# Patient Record
Sex: Female | Born: 1950 | Race: Black or African American | Hispanic: No | Marital: Married | Smoking: Former smoker
Health system: Southern US, Community
[De-identification: ages and names within clinical notes are randomized; demographics above are authoritative.]

## PROBLEM LIST (undated history)

## (undated) DIAGNOSIS — M542 Cervicalgia: Secondary | ICD-10-CM

## (undated) DIAGNOSIS — G629 Polyneuropathy, unspecified: Secondary | ICD-10-CM

## (undated) DIAGNOSIS — E1143 Type 2 diabetes mellitus with diabetic autonomic (poly)neuropathy: Secondary | ICD-10-CM

## (undated) DIAGNOSIS — G4730 Sleep apnea, unspecified: Secondary | ICD-10-CM

## (undated) DIAGNOSIS — N184 Chronic kidney disease, stage 4 (severe): Secondary | ICD-10-CM

## (undated) DIAGNOSIS — R51 Headache: Secondary | ICD-10-CM

## (undated) DIAGNOSIS — K56609 Unspecified intestinal obstruction, unspecified as to partial versus complete obstruction: Secondary | ICD-10-CM

## (undated) DIAGNOSIS — I5030 Unspecified diastolic (congestive) heart failure: Secondary | ICD-10-CM

## (undated) DIAGNOSIS — Z8719 Personal history of other diseases of the digestive system: Secondary | ICD-10-CM

## (undated) DIAGNOSIS — M797 Fibromyalgia: Secondary | ICD-10-CM

## (undated) DIAGNOSIS — Z0000 Encounter for general adult medical examination without abnormal findings: Secondary | ICD-10-CM

## (undated) DIAGNOSIS — K3184 Gastroparesis: Secondary | ICD-10-CM

## (undated) DIAGNOSIS — F603 Borderline personality disorder: Secondary | ICD-10-CM

## (undated) DIAGNOSIS — G5601 Carpal tunnel syndrome, right upper limb: Secondary | ICD-10-CM

## (undated) DIAGNOSIS — T1491XA Suicide attempt, initial encounter: Secondary | ICD-10-CM

## (undated) DIAGNOSIS — Z972 Presence of dental prosthetic device (complete) (partial): Secondary | ICD-10-CM

## (undated) DIAGNOSIS — E785 Hyperlipidemia, unspecified: Secondary | ICD-10-CM

## (undated) DIAGNOSIS — E538 Deficiency of other specified B group vitamins: Secondary | ICD-10-CM

## (undated) DIAGNOSIS — M7630 Iliotibial band syndrome, unspecified leg: Secondary | ICD-10-CM

## (undated) DIAGNOSIS — F29 Unspecified psychosis not due to a substance or known physiological condition: Secondary | ICD-10-CM

## (undated) DIAGNOSIS — Z973 Presence of spectacles and contact lenses: Secondary | ICD-10-CM

## (undated) DIAGNOSIS — M545 Low back pain, unspecified: Secondary | ICD-10-CM

## (undated) DIAGNOSIS — I10 Essential (primary) hypertension: Secondary | ICD-10-CM

## (undated) DIAGNOSIS — K589 Irritable bowel syndrome without diarrhea: Secondary | ICD-10-CM

## (undated) DIAGNOSIS — R0600 Dyspnea, unspecified: Secondary | ICD-10-CM

## (undated) DIAGNOSIS — F32A Depression, unspecified: Secondary | ICD-10-CM

## (undated) DIAGNOSIS — D649 Anemia, unspecified: Secondary | ICD-10-CM

## (undated) DIAGNOSIS — I2510 Atherosclerotic heart disease of native coronary artery without angina pectoris: Secondary | ICD-10-CM

## (undated) DIAGNOSIS — K219 Gastro-esophageal reflux disease without esophagitis: Secondary | ICD-10-CM

## (undated) DIAGNOSIS — M1990 Unspecified osteoarthritis, unspecified site: Secondary | ICD-10-CM

## (undated) DIAGNOSIS — F329 Major depressive disorder, single episode, unspecified: Secondary | ICD-10-CM

## (undated) DIAGNOSIS — M109 Gout, unspecified: Secondary | ICD-10-CM

## (undated) DIAGNOSIS — E119 Type 2 diabetes mellitus without complications: Secondary | ICD-10-CM

## (undated) DIAGNOSIS — K029 Dental caries, unspecified: Secondary | ICD-10-CM

## (undated) HISTORY — DX: Type 2 diabetes mellitus with diabetic autonomic (poly)neuropathy: E11.43

## (undated) HISTORY — DX: Low back pain: M54.5

## (undated) HISTORY — DX: Gastro-esophageal reflux disease without esophagitis: K21.9

## (undated) HISTORY — DX: Deficiency of other specified B group vitamins: E53.8

## (undated) HISTORY — PX: APPENDECTOMY: SHX54

## (undated) HISTORY — DX: Essential (primary) hypertension: I10

## (undated) HISTORY — DX: Cervicalgia: M54.2

## (undated) HISTORY — DX: Borderline personality disorder: F60.3

## (undated) HISTORY — DX: Major depressive disorder, single episode, unspecified: F32.9

## (undated) HISTORY — DX: Low back pain, unspecified: M54.50

## (undated) HISTORY — PX: COLONOSCOPY: SHX174

## (undated) HISTORY — DX: Anemia, unspecified: D64.9

## (undated) HISTORY — DX: Polyneuropathy, unspecified: G62.9

## (undated) HISTORY — DX: Depression, unspecified: F32.A

## (undated) HISTORY — DX: Unspecified diastolic (congestive) heart failure: I50.30

## (undated) HISTORY — PX: CATARACT EXTRACTION W/ INTRAOCULAR LENS  IMPLANT, BILATERAL: SHX1307

## (undated) HISTORY — DX: Encounter for general adult medical examination without abnormal findings: Z00.00

## (undated) HISTORY — DX: Hyperlipidemia, unspecified: E78.5

## (undated) HISTORY — PX: MULTIPLE TOOTH EXTRACTIONS: SHX2053

## (undated) HISTORY — DX: Unspecified intestinal obstruction, unspecified as to partial versus complete obstruction: K56.609

## (undated) HISTORY — DX: Atherosclerotic heart disease of native coronary artery without angina pectoris: I25.10

## (undated) HISTORY — DX: Gastroparesis: K31.84

## (undated) HISTORY — PX: ABDOMINAL HYSTERECTOMY: SHX81

## (undated) HISTORY — DX: Iliotibial band syndrome, unspecified leg: M76.30

## (undated) HISTORY — DX: Sleep apnea, unspecified: G47.30

## (undated) HISTORY — DX: Unspecified psychosis not due to a substance or known physiological condition: F29

## (undated) HISTORY — DX: Type 2 diabetes mellitus without complications: E11.9

---

## 1990-10-25 HISTORY — PX: HAND SURGERY: SHX662

## 1998-02-26 ENCOUNTER — Inpatient Hospital Stay (HOSPITAL_COMMUNITY): Admission: AD | Admit: 1998-02-26 | Discharge: 1998-03-08 | Payer: Self-pay | Admitting: Psychiatry

## 1998-04-10 ENCOUNTER — Emergency Department (HOSPITAL_COMMUNITY): Admission: EM | Admit: 1998-04-10 | Discharge: 1998-04-10 | Payer: Self-pay | Admitting: *Deleted

## 1998-05-01 ENCOUNTER — Emergency Department (HOSPITAL_COMMUNITY): Admission: EM | Admit: 1998-05-01 | Discharge: 1998-05-01 | Payer: Self-pay | Admitting: Emergency Medicine

## 1999-03-04 ENCOUNTER — Emergency Department (HOSPITAL_COMMUNITY): Admission: EM | Admit: 1999-03-04 | Discharge: 1999-03-04 | Payer: Self-pay | Admitting: Emergency Medicine

## 1999-03-10 ENCOUNTER — Encounter: Admission: RE | Admit: 1999-03-10 | Discharge: 1999-03-10 | Payer: Self-pay | Admitting: Internal Medicine

## 1999-03-13 ENCOUNTER — Encounter: Admission: RE | Admit: 1999-03-13 | Discharge: 1999-03-13 | Payer: Self-pay | Admitting: Internal Medicine

## 1999-03-17 ENCOUNTER — Encounter: Admission: RE | Admit: 1999-03-17 | Discharge: 1999-03-17 | Payer: Self-pay | Admitting: Internal Medicine

## 1999-03-26 ENCOUNTER — Encounter: Admission: RE | Admit: 1999-03-26 | Discharge: 1999-03-26 | Payer: Self-pay | Admitting: Hematology and Oncology

## 1999-04-07 ENCOUNTER — Encounter: Admission: RE | Admit: 1999-04-07 | Discharge: 1999-04-07 | Payer: Self-pay | Admitting: Hematology and Oncology

## 1999-04-20 ENCOUNTER — Encounter: Admission: RE | Admit: 1999-04-20 | Discharge: 1999-07-19 | Payer: Self-pay

## 1999-05-19 ENCOUNTER — Encounter: Admission: RE | Admit: 1999-05-19 | Discharge: 1999-05-19 | Payer: Self-pay | Admitting: Internal Medicine

## 1999-06-22 ENCOUNTER — Encounter: Admission: RE | Admit: 1999-06-22 | Discharge: 1999-06-22 | Payer: Self-pay | Admitting: Internal Medicine

## 1999-08-24 ENCOUNTER — Encounter: Admission: RE | Admit: 1999-08-24 | Discharge: 1999-08-24 | Payer: Self-pay | Admitting: Internal Medicine

## 1999-09-28 ENCOUNTER — Encounter: Admission: RE | Admit: 1999-09-28 | Discharge: 1999-09-28 | Payer: Self-pay | Admitting: Internal Medicine

## 1999-11-04 ENCOUNTER — Encounter: Admission: RE | Admit: 1999-11-04 | Discharge: 1999-11-04 | Payer: Self-pay | Admitting: Internal Medicine

## 1999-12-07 ENCOUNTER — Encounter: Admission: RE | Admit: 1999-12-07 | Discharge: 1999-12-07 | Payer: Self-pay | Admitting: Internal Medicine

## 1999-12-11 ENCOUNTER — Emergency Department (HOSPITAL_COMMUNITY): Admission: EM | Admit: 1999-12-11 | Discharge: 1999-12-11 | Payer: Self-pay

## 1999-12-21 ENCOUNTER — Encounter: Admission: RE | Admit: 1999-12-21 | Discharge: 1999-12-21 | Payer: Self-pay | Admitting: Internal Medicine

## 2000-01-04 ENCOUNTER — Ambulatory Visit (HOSPITAL_COMMUNITY): Admission: RE | Admit: 2000-01-04 | Discharge: 2000-01-04 | Payer: Self-pay

## 2000-01-22 ENCOUNTER — Encounter: Admission: RE | Admit: 2000-01-22 | Discharge: 2000-01-22 | Payer: Self-pay | Admitting: Internal Medicine

## 2000-01-27 ENCOUNTER — Encounter: Admission: RE | Admit: 2000-01-27 | Discharge: 2000-01-27 | Payer: Self-pay | Admitting: Internal Medicine

## 2000-03-14 ENCOUNTER — Encounter: Admission: RE | Admit: 2000-03-14 | Discharge: 2000-03-14 | Payer: Self-pay | Admitting: Internal Medicine

## 2000-03-28 ENCOUNTER — Encounter: Admission: RE | Admit: 2000-03-28 | Discharge: 2000-03-28 | Payer: Self-pay | Admitting: Internal Medicine

## 2000-04-07 ENCOUNTER — Encounter: Admission: RE | Admit: 2000-04-07 | Discharge: 2000-04-07 | Payer: Self-pay | Admitting: Internal Medicine

## 2000-05-02 ENCOUNTER — Encounter: Admission: RE | Admit: 2000-05-02 | Discharge: 2000-05-02 | Payer: Self-pay | Admitting: Internal Medicine

## 2000-07-18 ENCOUNTER — Encounter: Admission: RE | Admit: 2000-07-18 | Discharge: 2000-07-18 | Payer: Self-pay | Admitting: Internal Medicine

## 2000-12-01 ENCOUNTER — Encounter: Admission: RE | Admit: 2000-12-01 | Discharge: 2000-12-01 | Payer: Self-pay | Admitting: Hematology and Oncology

## 2001-02-03 ENCOUNTER — Encounter: Payer: Self-pay | Admitting: Emergency Medicine

## 2001-02-03 ENCOUNTER — Encounter: Payer: Self-pay | Admitting: *Deleted

## 2001-02-03 ENCOUNTER — Inpatient Hospital Stay (HOSPITAL_COMMUNITY): Admission: EM | Admit: 2001-02-03 | Discharge: 2001-02-06 | Payer: Self-pay | Admitting: Emergency Medicine

## 2001-02-09 ENCOUNTER — Encounter: Admission: RE | Admit: 2001-02-09 | Discharge: 2001-02-09 | Payer: Self-pay

## 2001-03-01 ENCOUNTER — Encounter: Admission: RE | Admit: 2001-03-01 | Discharge: 2001-03-01 | Payer: Self-pay

## 2001-03-15 ENCOUNTER — Emergency Department (HOSPITAL_COMMUNITY): Admission: EM | Admit: 2001-03-15 | Discharge: 2001-03-15 | Payer: Self-pay | Admitting: Emergency Medicine

## 2001-03-22 ENCOUNTER — Encounter: Admission: RE | Admit: 2001-03-22 | Discharge: 2001-03-22 | Payer: Self-pay | Admitting: Internal Medicine

## 2001-03-28 ENCOUNTER — Encounter: Admission: RE | Admit: 2001-03-28 | Discharge: 2001-03-28 | Payer: Self-pay | Admitting: Internal Medicine

## 2001-03-29 ENCOUNTER — Encounter: Admission: RE | Admit: 2001-03-29 | Discharge: 2001-06-27 | Payer: Self-pay

## 2001-04-05 ENCOUNTER — Encounter: Admission: RE | Admit: 2001-04-05 | Discharge: 2001-04-05 | Payer: Self-pay | Admitting: Internal Medicine

## 2001-05-01 ENCOUNTER — Encounter: Admission: RE | Admit: 2001-05-01 | Discharge: 2001-05-01 | Payer: Self-pay | Admitting: Internal Medicine

## 2001-06-29 ENCOUNTER — Encounter: Admission: RE | Admit: 2001-06-29 | Discharge: 2001-06-29 | Payer: Self-pay | Admitting: Internal Medicine

## 2001-08-29 ENCOUNTER — Ambulatory Visit (HOSPITAL_COMMUNITY): Admission: RE | Admit: 2001-08-29 | Discharge: 2001-08-29 | Payer: Self-pay | Admitting: Internal Medicine

## 2001-08-29 ENCOUNTER — Encounter: Admission: RE | Admit: 2001-08-29 | Discharge: 2001-08-29 | Payer: Self-pay | Admitting: Internal Medicine

## 2001-08-29 ENCOUNTER — Encounter: Payer: Self-pay | Admitting: Internal Medicine

## 2001-09-07 ENCOUNTER — Encounter: Admission: RE | Admit: 2001-09-07 | Discharge: 2001-11-02 | Payer: Self-pay | Admitting: Internal Medicine

## 2001-09-12 ENCOUNTER — Ambulatory Visit (HOSPITAL_COMMUNITY): Admission: RE | Admit: 2001-09-12 | Discharge: 2001-09-12 | Payer: Self-pay | Admitting: Internal Medicine

## 2001-09-25 ENCOUNTER — Encounter: Payer: Self-pay | Admitting: Emergency Medicine

## 2001-09-25 ENCOUNTER — Emergency Department (HOSPITAL_COMMUNITY): Admission: EM | Admit: 2001-09-25 | Discharge: 2001-09-25 | Payer: Self-pay | Admitting: *Deleted

## 2001-10-26 ENCOUNTER — Encounter: Admission: RE | Admit: 2001-10-26 | Discharge: 2001-10-26 | Payer: Self-pay

## 2001-10-30 ENCOUNTER — Encounter: Admission: RE | Admit: 2001-10-30 | Discharge: 2001-10-30 | Payer: Self-pay

## 2001-11-16 ENCOUNTER — Encounter: Admission: RE | Admit: 2001-11-16 | Discharge: 2001-11-16 | Payer: Self-pay | Admitting: Internal Medicine

## 2001-11-23 ENCOUNTER — Encounter: Admission: RE | Admit: 2001-11-23 | Discharge: 2002-02-21 | Payer: Self-pay | Admitting: Internal Medicine

## 2001-12-06 ENCOUNTER — Ambulatory Visit (HOSPITAL_COMMUNITY): Admission: RE | Admit: 2001-12-06 | Discharge: 2001-12-06 | Payer: Self-pay | Admitting: Internal Medicine

## 2001-12-17 ENCOUNTER — Encounter: Payer: Self-pay | Admitting: Internal Medicine

## 2001-12-17 ENCOUNTER — Inpatient Hospital Stay (HOSPITAL_COMMUNITY): Admission: EM | Admit: 2001-12-17 | Discharge: 2001-12-25 | Payer: Self-pay | Admitting: Emergency Medicine

## 2001-12-18 ENCOUNTER — Encounter: Payer: Self-pay | Admitting: Internal Medicine

## 2001-12-18 ENCOUNTER — Encounter: Payer: Self-pay | Admitting: General Surgery

## 2001-12-21 ENCOUNTER — Encounter: Payer: Self-pay | Admitting: Internal Medicine

## 2002-01-02 ENCOUNTER — Encounter: Admission: RE | Admit: 2002-01-02 | Discharge: 2002-01-02 | Payer: Self-pay | Admitting: Internal Medicine

## 2002-01-09 ENCOUNTER — Encounter: Admission: RE | Admit: 2002-01-09 | Discharge: 2002-01-09 | Payer: Self-pay | Admitting: Internal Medicine

## 2002-01-16 ENCOUNTER — Encounter: Admission: RE | Admit: 2002-01-16 | Discharge: 2002-01-16 | Payer: Self-pay | Admitting: Internal Medicine

## 2002-01-23 ENCOUNTER — Encounter: Admission: RE | Admit: 2002-01-23 | Discharge: 2002-01-23 | Payer: Self-pay | Admitting: Internal Medicine

## 2002-03-01 ENCOUNTER — Encounter: Payer: Self-pay | Admitting: Gastroenterology

## 2002-03-01 ENCOUNTER — Ambulatory Visit (HOSPITAL_COMMUNITY): Admission: RE | Admit: 2002-03-01 | Discharge: 2002-03-01 | Payer: Self-pay | Admitting: Gastroenterology

## 2002-03-12 ENCOUNTER — Encounter: Admission: RE | Admit: 2002-03-12 | Discharge: 2002-03-12 | Payer: Self-pay | Admitting: Internal Medicine

## 2002-04-05 ENCOUNTER — Encounter: Admission: RE | Admit: 2002-04-05 | Discharge: 2002-04-05 | Payer: Self-pay | Admitting: Internal Medicine

## 2002-04-05 ENCOUNTER — Ambulatory Visit (HOSPITAL_COMMUNITY): Admission: RE | Admit: 2002-04-05 | Discharge: 2002-04-05 | Payer: Self-pay | Admitting: Internal Medicine

## 2002-04-05 ENCOUNTER — Encounter: Payer: Self-pay | Admitting: Internal Medicine

## 2002-05-16 ENCOUNTER — Encounter: Admission: RE | Admit: 2002-05-16 | Discharge: 2002-05-16 | Payer: Self-pay | Admitting: Internal Medicine

## 2002-05-21 ENCOUNTER — Encounter: Admission: RE | Admit: 2002-05-21 | Discharge: 2002-05-21 | Payer: Self-pay | Admitting: Internal Medicine

## 2002-06-04 ENCOUNTER — Encounter: Admission: RE | Admit: 2002-06-04 | Discharge: 2002-06-04 | Payer: Self-pay | Admitting: Internal Medicine

## 2002-07-02 ENCOUNTER — Ambulatory Visit (HOSPITAL_COMMUNITY): Admission: RE | Admit: 2002-07-02 | Discharge: 2002-07-02 | Payer: Self-pay | Admitting: Internal Medicine

## 2002-07-02 ENCOUNTER — Encounter: Admission: RE | Admit: 2002-07-02 | Discharge: 2002-07-02 | Payer: Self-pay | Admitting: Internal Medicine

## 2002-07-02 ENCOUNTER — Encounter: Payer: Self-pay | Admitting: Internal Medicine

## 2002-07-26 ENCOUNTER — Encounter: Payer: Self-pay | Admitting: Internal Medicine

## 2002-07-26 ENCOUNTER — Inpatient Hospital Stay (HOSPITAL_COMMUNITY): Admission: EM | Admit: 2002-07-26 | Discharge: 2002-07-28 | Payer: Self-pay | Admitting: Emergency Medicine

## 2002-07-26 ENCOUNTER — Encounter: Admission: RE | Admit: 2002-07-26 | Discharge: 2002-07-26 | Payer: Self-pay | Admitting: Internal Medicine

## 2002-07-30 ENCOUNTER — Encounter: Admission: RE | Admit: 2002-07-30 | Discharge: 2002-07-30 | Payer: Self-pay | Admitting: Internal Medicine

## 2002-08-23 ENCOUNTER — Encounter: Admission: RE | Admit: 2002-08-23 | Discharge: 2002-08-23 | Payer: Self-pay | Admitting: Internal Medicine

## 2002-08-31 ENCOUNTER — Encounter: Admission: RE | Admit: 2002-08-31 | Discharge: 2002-08-31 | Payer: Self-pay | Admitting: Internal Medicine

## 2002-09-14 ENCOUNTER — Encounter: Admission: RE | Admit: 2002-09-14 | Discharge: 2002-09-14 | Payer: Self-pay | Admitting: Internal Medicine

## 2002-10-03 ENCOUNTER — Encounter: Admission: RE | Admit: 2002-10-03 | Discharge: 2002-10-03 | Payer: Self-pay | Admitting: Internal Medicine

## 2002-10-04 ENCOUNTER — Ambulatory Visit (HOSPITAL_COMMUNITY): Admission: RE | Admit: 2002-10-04 | Discharge: 2002-10-04 | Payer: Self-pay | Admitting: *Deleted

## 2002-10-04 ENCOUNTER — Encounter: Payer: Self-pay | Admitting: *Deleted

## 2002-10-05 ENCOUNTER — Ambulatory Visit (HOSPITAL_COMMUNITY): Admission: RE | Admit: 2002-10-05 | Discharge: 2002-10-05 | Payer: Self-pay | Admitting: *Deleted

## 2002-10-05 ENCOUNTER — Encounter: Payer: Self-pay | Admitting: *Deleted

## 2002-11-05 ENCOUNTER — Ambulatory Visit (HOSPITAL_COMMUNITY): Admission: RE | Admit: 2002-11-05 | Discharge: 2002-11-05 | Payer: Self-pay | Admitting: Internal Medicine

## 2002-11-05 ENCOUNTER — Encounter: Admission: RE | Admit: 2002-11-05 | Discharge: 2002-11-05 | Payer: Self-pay | Admitting: Internal Medicine

## 2002-11-06 ENCOUNTER — Inpatient Hospital Stay (HOSPITAL_COMMUNITY): Admission: EM | Admit: 2002-11-06 | Discharge: 2002-11-10 | Payer: Self-pay | Admitting: *Deleted

## 2002-12-12 ENCOUNTER — Encounter: Admission: RE | Admit: 2002-12-12 | Discharge: 2002-12-12 | Payer: Self-pay | Admitting: Internal Medicine

## 2003-01-14 ENCOUNTER — Encounter: Admission: RE | Admit: 2003-01-14 | Discharge: 2003-01-14 | Payer: Self-pay | Admitting: Internal Medicine

## 2003-02-12 ENCOUNTER — Encounter: Admission: RE | Admit: 2003-02-12 | Discharge: 2003-02-12 | Payer: Self-pay | Admitting: Internal Medicine

## 2003-03-20 ENCOUNTER — Encounter: Admission: RE | Admit: 2003-03-20 | Discharge: 2003-03-20 | Payer: Self-pay | Admitting: Internal Medicine

## 2003-04-19 ENCOUNTER — Encounter: Admission: RE | Admit: 2003-04-19 | Discharge: 2003-04-19 | Payer: Self-pay | Admitting: Internal Medicine

## 2003-05-12 ENCOUNTER — Emergency Department (HOSPITAL_COMMUNITY): Admission: EM | Admit: 2003-05-12 | Discharge: 2003-05-13 | Payer: Self-pay | Admitting: Emergency Medicine

## 2003-05-14 ENCOUNTER — Encounter: Admission: RE | Admit: 2003-05-14 | Discharge: 2003-05-14 | Payer: Self-pay | Admitting: Internal Medicine

## 2003-05-15 ENCOUNTER — Encounter: Admission: RE | Admit: 2003-05-15 | Discharge: 2003-05-15 | Payer: Self-pay | Admitting: Internal Medicine

## 2003-05-28 ENCOUNTER — Encounter: Admission: RE | Admit: 2003-05-28 | Discharge: 2003-05-28 | Payer: Self-pay | Admitting: Internal Medicine

## 2003-06-14 ENCOUNTER — Encounter: Admission: RE | Admit: 2003-06-14 | Discharge: 2003-06-14 | Payer: Self-pay | Admitting: Internal Medicine

## 2003-06-21 ENCOUNTER — Encounter: Admission: RE | Admit: 2003-06-21 | Discharge: 2003-06-21 | Payer: Self-pay | Admitting: Internal Medicine

## 2003-07-12 ENCOUNTER — Encounter: Admission: RE | Admit: 2003-07-12 | Discharge: 2003-07-12 | Payer: Self-pay | Admitting: Internal Medicine

## 2003-07-14 ENCOUNTER — Emergency Department (HOSPITAL_COMMUNITY): Admission: EM | Admit: 2003-07-14 | Discharge: 2003-07-14 | Payer: Self-pay | Admitting: Emergency Medicine

## 2003-07-17 ENCOUNTER — Encounter: Admission: RE | Admit: 2003-07-17 | Discharge: 2003-07-17 | Payer: Self-pay | Admitting: Internal Medicine

## 2003-07-18 ENCOUNTER — Encounter
Admission: RE | Admit: 2003-07-18 | Discharge: 2003-10-16 | Payer: Self-pay | Admitting: Physical Medicine & Rehabilitation

## 2003-07-26 ENCOUNTER — Encounter: Admission: RE | Admit: 2003-07-26 | Discharge: 2003-07-26 | Payer: Self-pay | Admitting: Internal Medicine

## 2003-08-01 ENCOUNTER — Encounter: Admission: RE | Admit: 2003-08-01 | Discharge: 2003-08-01 | Payer: Self-pay | Admitting: Internal Medicine

## 2003-08-13 ENCOUNTER — Encounter: Payer: Self-pay | Admitting: Emergency Medicine

## 2003-08-13 ENCOUNTER — Emergency Department (HOSPITAL_COMMUNITY): Admission: EM | Admit: 2003-08-13 | Discharge: 2003-08-13 | Payer: Self-pay | Admitting: Emergency Medicine

## 2003-08-14 ENCOUNTER — Encounter: Admission: RE | Admit: 2003-08-14 | Discharge: 2003-11-12 | Payer: Self-pay | Admitting: Internal Medicine

## 2003-09-02 ENCOUNTER — Encounter
Admission: RE | Admit: 2003-09-02 | Discharge: 2003-12-01 | Payer: Self-pay | Admitting: Physical Medicine & Rehabilitation

## 2003-09-02 ENCOUNTER — Ambulatory Visit (HOSPITAL_COMMUNITY): Admission: RE | Admit: 2003-09-02 | Discharge: 2003-09-02 | Payer: Self-pay | Admitting: Internal Medicine

## 2003-09-02 ENCOUNTER — Encounter: Admission: RE | Admit: 2003-09-02 | Discharge: 2003-09-02 | Payer: Self-pay | Admitting: Internal Medicine

## 2003-09-09 ENCOUNTER — Ambulatory Visit (HOSPITAL_COMMUNITY): Admission: RE | Admit: 2003-09-09 | Discharge: 2003-09-09 | Payer: Self-pay | Admitting: Family Medicine

## 2003-11-01 ENCOUNTER — Encounter: Admission: RE | Admit: 2003-11-01 | Discharge: 2003-11-01 | Payer: Self-pay | Admitting: Internal Medicine

## 2003-11-01 ENCOUNTER — Ambulatory Visit (HOSPITAL_COMMUNITY): Admission: RE | Admit: 2003-11-01 | Discharge: 2003-11-01 | Payer: Self-pay | Admitting: Internal Medicine

## 2003-12-17 ENCOUNTER — Encounter: Admission: RE | Admit: 2003-12-17 | Discharge: 2003-12-17 | Payer: Self-pay | Admitting: Internal Medicine

## 2003-12-21 ENCOUNTER — Ambulatory Visit (HOSPITAL_BASED_OUTPATIENT_CLINIC_OR_DEPARTMENT_OTHER): Admission: RE | Admit: 2003-12-21 | Discharge: 2003-12-21 | Payer: Self-pay | Admitting: Internal Medicine

## 2004-01-03 ENCOUNTER — Encounter
Admission: RE | Admit: 2004-01-03 | Discharge: 2004-04-02 | Payer: Self-pay | Admitting: Physical Medicine & Rehabilitation

## 2004-02-05 ENCOUNTER — Ambulatory Visit (HOSPITAL_BASED_OUTPATIENT_CLINIC_OR_DEPARTMENT_OTHER): Admission: RE | Admit: 2004-02-05 | Discharge: 2004-02-05 | Payer: Self-pay | Admitting: Internal Medicine

## 2004-02-13 ENCOUNTER — Encounter: Admission: RE | Admit: 2004-02-13 | Discharge: 2004-02-13 | Payer: Self-pay | Admitting: Internal Medicine

## 2004-02-13 ENCOUNTER — Ambulatory Visit (HOSPITAL_COMMUNITY): Admission: RE | Admit: 2004-02-13 | Discharge: 2004-02-13 | Payer: Self-pay | Admitting: Internal Medicine

## 2004-02-25 ENCOUNTER — Encounter: Admission: RE | Admit: 2004-02-25 | Discharge: 2004-02-25 | Payer: Self-pay | Admitting: Internal Medicine

## 2004-02-26 ENCOUNTER — Ambulatory Visit (HOSPITAL_BASED_OUTPATIENT_CLINIC_OR_DEPARTMENT_OTHER): Admission: RE | Admit: 2004-02-26 | Discharge: 2004-02-26 | Payer: Self-pay | Admitting: Internal Medicine

## 2004-03-05 ENCOUNTER — Encounter
Admission: RE | Admit: 2004-03-05 | Discharge: 2004-03-05 | Payer: Self-pay | Admitting: Physical Medicine & Rehabilitation

## 2004-05-01 ENCOUNTER — Encounter
Admission: RE | Admit: 2004-05-01 | Discharge: 2004-07-03 | Payer: Self-pay | Admitting: Physical Medicine & Rehabilitation

## 2004-06-30 ENCOUNTER — Ambulatory Visit: Payer: Self-pay | Admitting: Internal Medicine

## 2004-07-03 ENCOUNTER — Encounter
Admission: RE | Admit: 2004-07-03 | Discharge: 2004-08-31 | Payer: Self-pay | Admitting: Physical Medicine & Rehabilitation

## 2004-07-07 ENCOUNTER — Ambulatory Visit: Payer: Self-pay | Admitting: Physical Medicine & Rehabilitation

## 2004-08-07 ENCOUNTER — Ambulatory Visit: Payer: Self-pay | Admitting: Internal Medicine

## 2004-08-14 ENCOUNTER — Encounter: Admission: RE | Admit: 2004-08-14 | Discharge: 2004-08-14 | Payer: Self-pay | Admitting: Sports Medicine

## 2004-08-31 ENCOUNTER — Encounter
Admission: RE | Admit: 2004-08-31 | Discharge: 2004-11-29 | Payer: Self-pay | Admitting: Physical Medicine & Rehabilitation

## 2004-09-02 ENCOUNTER — Encounter: Admission: RE | Admit: 2004-09-02 | Discharge: 2004-09-30 | Payer: Self-pay | Admitting: Internal Medicine

## 2004-09-10 ENCOUNTER — Ambulatory Visit (HOSPITAL_COMMUNITY): Admission: RE | Admit: 2004-09-10 | Discharge: 2004-09-10 | Payer: Self-pay | Admitting: Sports Medicine

## 2004-11-13 ENCOUNTER — Ambulatory Visit: Payer: Self-pay | Admitting: Internal Medicine

## 2004-11-19 ENCOUNTER — Ambulatory Visit: Payer: Self-pay | Admitting: Internal Medicine

## 2005-02-09 ENCOUNTER — Ambulatory Visit: Payer: Self-pay | Admitting: Internal Medicine

## 2005-02-12 ENCOUNTER — Ambulatory Visit: Payer: Self-pay | Admitting: Internal Medicine

## 2005-03-17 ENCOUNTER — Ambulatory Visit: Payer: Self-pay | Admitting: Internal Medicine

## 2005-04-08 ENCOUNTER — Encounter: Admission: RE | Admit: 2005-04-08 | Discharge: 2005-07-07 | Payer: Self-pay | Admitting: Family Medicine

## 2006-04-01 ENCOUNTER — Ambulatory Visit: Payer: Self-pay | Admitting: Internal Medicine

## 2006-04-07 ENCOUNTER — Encounter: Payer: Self-pay | Admitting: Internal Medicine

## 2006-04-07 ENCOUNTER — Ambulatory Visit (HOSPITAL_COMMUNITY): Admission: RE | Admit: 2006-04-07 | Discharge: 2006-04-07 | Payer: Self-pay | Admitting: Diagnostic Radiology

## 2006-04-18 ENCOUNTER — Ambulatory Visit: Payer: Self-pay | Admitting: Internal Medicine

## 2006-05-05 ENCOUNTER — Emergency Department (HOSPITAL_COMMUNITY): Admission: EM | Admit: 2006-05-05 | Discharge: 2006-05-05 | Payer: Self-pay | Admitting: Emergency Medicine

## 2007-04-24 ENCOUNTER — Telehealth: Payer: Self-pay | Admitting: *Deleted

## 2007-05-25 ENCOUNTER — Encounter: Payer: Self-pay | Admitting: Internal Medicine

## 2007-05-25 ENCOUNTER — Ambulatory Visit: Payer: Self-pay | Admitting: Internal Medicine

## 2007-05-25 DIAGNOSIS — G4730 Sleep apnea, unspecified: Secondary | ICD-10-CM | POA: Insufficient documentation

## 2007-05-25 DIAGNOSIS — F332 Major depressive disorder, recurrent severe without psychotic features: Secondary | ICD-10-CM | POA: Insufficient documentation

## 2007-05-25 DIAGNOSIS — E1165 Type 2 diabetes mellitus with hyperglycemia: Secondary | ICD-10-CM | POA: Insufficient documentation

## 2007-05-25 DIAGNOSIS — I10 Essential (primary) hypertension: Secondary | ICD-10-CM

## 2007-05-25 DIAGNOSIS — K219 Gastro-esophageal reflux disease without esophagitis: Secondary | ICD-10-CM | POA: Insufficient documentation

## 2007-05-25 DIAGNOSIS — G609 Hereditary and idiopathic neuropathy, unspecified: Secondary | ICD-10-CM | POA: Insufficient documentation

## 2007-05-25 LAB — CONVERTED CEMR LAB: Hgb A1c MFr Bld: 8.6 %

## 2007-05-26 LAB — CONVERTED CEMR LAB
AST: 17 units/L (ref 0–37)
Albumin: 3.9 g/dL (ref 3.5–5.2)
Alkaline Phosphatase: 115 units/L (ref 39–117)
BUN: 21 mg/dL (ref 6–23)
Basophils Absolute: 0 10*3/uL (ref 0.0–0.1)
Basophils Relative: 0 % (ref 0–1)
Creatinine, Ser: 1.22 mg/dL — ABNORMAL HIGH (ref 0.40–1.20)
Eosinophils Absolute: 0.1 10*3/uL (ref 0.0–0.7)
HDL: 33 mg/dL — ABNORMAL LOW (ref >39)
Ketones, ur: 15 mg/dL — AB
Leukocytes, UA: NEGATIVE
MCHC: 32.7 g/dL (ref 30.0–36.0)
MCV: 72.6 fL — ABNORMAL LOW (ref 78.0–100.0)
Microalb, Ur: 6.41 mg/dL — ABNORMAL HIGH (ref 0.00–1.89)
Monocytes Relative: 6 % (ref 3–11)
Neutrophils Relative %: 46 % (ref 43–77)
Nitrite: NEGATIVE
Potassium: 4.8 meq/L (ref 3.5–5.3)
RBC: 5.24 M/uL — ABNORMAL HIGH (ref 3.87–5.11)
RDW: 17.7 % — ABNORMAL HIGH (ref 11.5–14.0)
TSH: 0.781 microintl units/mL (ref 0.350–5.50)
Total CHOL/HDL Ratio: 8.2
Urobilinogen, UA: 0.2 (ref 0.0–1.0)

## 2007-06-05 ENCOUNTER — Encounter (INDEPENDENT_AMBULATORY_CARE_PROVIDER_SITE_OTHER): Payer: Self-pay | Admitting: *Deleted

## 2007-06-05 ENCOUNTER — Ambulatory Visit: Payer: Self-pay | Admitting: Infectious Disease

## 2007-06-05 LAB — CONVERTED CEMR LAB
BUN: 17 mg/dL (ref 6–23)
Creatinine, Ser: 1.56 mg/dL — ABNORMAL HIGH (ref 0.40–1.20)
Potassium: 4.5 meq/L (ref 3.5–5.3)

## 2007-06-06 ENCOUNTER — Telehealth (INDEPENDENT_AMBULATORY_CARE_PROVIDER_SITE_OTHER): Payer: Self-pay | Admitting: *Deleted

## 2007-06-07 ENCOUNTER — Ambulatory Visit: Payer: Self-pay | Admitting: Infectious Disease

## 2007-06-07 ENCOUNTER — Encounter (INDEPENDENT_AMBULATORY_CARE_PROVIDER_SITE_OTHER): Payer: Self-pay | Admitting: *Deleted

## 2007-06-07 ENCOUNTER — Telehealth: Payer: Self-pay | Admitting: *Deleted

## 2007-06-09 ENCOUNTER — Encounter: Payer: Self-pay | Admitting: Emergency Medicine

## 2007-06-10 ENCOUNTER — Ambulatory Visit: Payer: Self-pay | Admitting: Internal Medicine

## 2007-06-10 ENCOUNTER — Inpatient Hospital Stay (HOSPITAL_COMMUNITY): Admission: EM | Admit: 2007-06-10 | Discharge: 2007-06-13 | Payer: Self-pay | Admitting: Internal Medicine

## 2007-06-13 ENCOUNTER — Encounter: Payer: Self-pay | Admitting: Internal Medicine

## 2007-06-29 ENCOUNTER — Ambulatory Visit: Payer: Self-pay | Admitting: Internal Medicine

## 2007-06-29 ENCOUNTER — Encounter (INDEPENDENT_AMBULATORY_CARE_PROVIDER_SITE_OTHER): Payer: Self-pay | Admitting: *Deleted

## 2007-06-29 LAB — CONVERTED CEMR LAB
Blood Glucose, Fingerstick: 58
CO2: 26 meq/L (ref 19–32)
Calcium: 9.3 mg/dL (ref 8.4–10.5)
Sodium: 143 meq/L (ref 135–145)

## 2007-08-15 ENCOUNTER — Telehealth: Payer: Self-pay | Admitting: Internal Medicine

## 2007-08-18 ENCOUNTER — Telehealth: Payer: Self-pay | Admitting: Internal Medicine

## 2007-09-06 ENCOUNTER — Telehealth (INDEPENDENT_AMBULATORY_CARE_PROVIDER_SITE_OTHER): Payer: Self-pay | Admitting: *Deleted

## 2007-09-29 ENCOUNTER — Telehealth: Payer: Self-pay | Admitting: *Deleted

## 2007-11-22 ENCOUNTER — Telehealth: Payer: Self-pay | Admitting: Internal Medicine

## 2007-11-24 ENCOUNTER — Telehealth: Payer: Self-pay | Admitting: Internal Medicine

## 2007-12-18 ENCOUNTER — Ambulatory Visit: Payer: Self-pay | Admitting: Internal Medicine

## 2007-12-18 LAB — CONVERTED CEMR LAB: Hgb A1c MFr Bld: 5.4 %

## 2008-02-27 ENCOUNTER — Telehealth: Payer: Self-pay | Admitting: *Deleted

## 2008-02-29 ENCOUNTER — Ambulatory Visit: Payer: Self-pay | Admitting: Infectious Disease

## 2008-02-29 ENCOUNTER — Ambulatory Visit (HOSPITAL_COMMUNITY): Admission: RE | Admit: 2008-02-29 | Discharge: 2008-02-29 | Payer: Self-pay | Admitting: *Deleted

## 2008-02-29 LAB — CONVERTED CEMR LAB: Blood Glucose, Fingerstick: 46

## 2008-03-19 ENCOUNTER — Telehealth (INDEPENDENT_AMBULATORY_CARE_PROVIDER_SITE_OTHER): Payer: Self-pay | Admitting: Pharmacy Technician

## 2008-05-10 ENCOUNTER — Ambulatory Visit: Payer: Self-pay | Admitting: *Deleted

## 2008-05-10 LAB — CONVERTED CEMR LAB
Blood Glucose, AC Bkfst: 59 mg/dL
Hgb A1c MFr Bld: 5.4 %

## 2008-05-14 ENCOUNTER — Ambulatory Visit (HOSPITAL_COMMUNITY): Admission: RE | Admit: 2008-05-14 | Discharge: 2008-05-14 | Payer: Self-pay | Admitting: Internal Medicine

## 2008-06-17 ENCOUNTER — Ambulatory Visit: Payer: Self-pay | Admitting: Internal Medicine

## 2008-06-17 LAB — CONVERTED CEMR LAB: Hgb A1c MFr Bld: 6.3 %

## 2008-07-04 ENCOUNTER — Encounter: Payer: Self-pay | Admitting: Internal Medicine

## 2008-07-04 ENCOUNTER — Ambulatory Visit: Payer: Self-pay | Admitting: Internal Medicine

## 2008-07-04 ENCOUNTER — Encounter (INDEPENDENT_AMBULATORY_CARE_PROVIDER_SITE_OTHER): Payer: Self-pay | Admitting: *Deleted

## 2008-07-04 ENCOUNTER — Ambulatory Visit (HOSPITAL_COMMUNITY): Admission: RE | Admit: 2008-07-04 | Discharge: 2008-07-04 | Payer: Self-pay | Admitting: Internal Medicine

## 2008-07-09 DIAGNOSIS — D649 Anemia, unspecified: Secondary | ICD-10-CM | POA: Insufficient documentation

## 2008-07-12 ENCOUNTER — Telehealth: Payer: Self-pay | Admitting: Internal Medicine

## 2008-07-12 LAB — CONVERTED CEMR LAB
HCT: 30.6 % — ABNORMAL LOW (ref 36.0–46.0)
RDW: 19 % — ABNORMAL HIGH (ref 11.5–15.5)
WBC: 9.8 10*3/uL (ref 4.0–10.5)

## 2008-07-29 ENCOUNTER — Encounter: Payer: Self-pay | Admitting: Internal Medicine

## 2008-07-29 ENCOUNTER — Ambulatory Visit: Payer: Self-pay | Admitting: Internal Medicine

## 2008-07-29 LAB — CONVERTED CEMR LAB
AST: 21 units/L (ref 0–37)
Albumin: 4.3 g/dL (ref 3.5–5.2)
Alkaline Phosphatase: 75 units/L (ref 39–117)
Anti Nuclear Antibody(ANA): NEGATIVE
BUN: 22 mg/dL (ref 6–23)
Basophils Absolute: 0 10*3/uL (ref 0.0–0.1)
Creatinine, Ser: 1.41 mg/dL — ABNORMAL HIGH (ref 0.40–1.20)
Ferritin: 6 ng/mL — ABNORMAL LOW (ref 10–291)
Glucose, Bld: 80 mg/dL (ref 70–99)
HCT: 30.1 % — ABNORMAL LOW (ref 36.0–46.0)
Iron: 34 ug/dL — ABNORMAL LOW (ref 42–145)
Lymphocytes Relative: 48 % — ABNORMAL HIGH (ref 12–46)
Lymphs Abs: 4.4 10*3/uL — ABNORMAL HIGH (ref 0.7–4.0)
Neutro Abs: 3.9 10*3/uL (ref 1.7–7.7)
Neutrophils Relative %: 42 % — ABNORMAL LOW (ref 43–77)
Platelets: 337 10*3/uL (ref 150–400)
RDW: 19.1 % — ABNORMAL HIGH (ref 11.5–15.5)
TIBC: 488 ug/dL — ABNORMAL HIGH (ref 250–470)
UIBC: 454 ug/dL
WBC: 9.2 10*3/uL (ref 4.0–10.5)

## 2008-07-30 ENCOUNTER — Encounter: Payer: Self-pay | Admitting: Internal Medicine

## 2008-07-30 LAB — CONVERTED CEMR LAB: RBC Folate: 1033 ng/mL — ABNORMAL HIGH (ref 180–600)

## 2008-07-31 ENCOUNTER — Telehealth: Payer: Self-pay | Admitting: Internal Medicine

## 2008-08-19 ENCOUNTER — Encounter: Payer: Self-pay | Admitting: Internal Medicine

## 2008-08-19 ENCOUNTER — Ambulatory Visit: Payer: Self-pay | Admitting: Internal Medicine

## 2008-08-19 LAB — CONVERTED CEMR LAB
Basophils Absolute: 0 10*3/uL (ref 0.0–0.1)
Basophils Relative: 0 % (ref 0–1)
Blood Glucose, Fingerstick: 96
Hemoglobin: 9.3 g/dL — ABNORMAL LOW (ref 12.0–15.0)
Lymphocytes Relative: 46 % (ref 12–46)
MCHC: 30.8 g/dL (ref 30.0–36.0)
Monocytes Absolute: 0.5 10*3/uL (ref 0.1–1.0)
Monocytes Relative: 7 % (ref 3–12)
Neutro Abs: 3.8 10*3/uL (ref 1.7–7.7)
Neutrophils Relative %: 46 % (ref 43–77)
RBC Folate: 878 ng/mL — ABNORMAL HIGH (ref 180–600)
RBC: 4.41 M/uL (ref 3.87–5.11)
RDW: 18.8 % — ABNORMAL HIGH (ref 11.5–15.5)

## 2008-08-20 ENCOUNTER — Encounter: Payer: Self-pay | Admitting: Internal Medicine

## 2008-08-21 ENCOUNTER — Telehealth: Payer: Self-pay | Admitting: *Deleted

## 2008-08-21 ENCOUNTER — Encounter: Payer: Self-pay | Admitting: Internal Medicine

## 2008-08-21 ENCOUNTER — Emergency Department (HOSPITAL_COMMUNITY): Admission: EM | Admit: 2008-08-21 | Discharge: 2008-08-21 | Payer: Self-pay | Admitting: Emergency Medicine

## 2008-08-22 LAB — CONVERTED CEMR LAB
BUN: 19 mg/dL (ref 6–23)
Chloride: 106 meq/L (ref 96–112)
Potassium: 4.6 meq/L (ref 3.5–5.3)
Sodium: 141 meq/L (ref 135–145)
Vit D, 1,25-Dihydroxy: 20 — ABNORMAL LOW (ref 30–89)

## 2008-09-10 ENCOUNTER — Ambulatory Visit (HOSPITAL_BASED_OUTPATIENT_CLINIC_OR_DEPARTMENT_OTHER): Admission: RE | Admit: 2008-09-10 | Discharge: 2008-09-10 | Payer: Self-pay | Admitting: Unknown Physician Specialty

## 2008-09-10 ENCOUNTER — Encounter: Payer: Self-pay | Admitting: Internal Medicine

## 2008-09-13 ENCOUNTER — Ambulatory Visit: Payer: Self-pay | Admitting: Internal Medicine

## 2008-09-13 ENCOUNTER — Encounter: Payer: Self-pay | Admitting: Internal Medicine

## 2008-09-13 LAB — HM COLONOSCOPY

## 2008-09-26 ENCOUNTER — Encounter: Payer: Self-pay | Admitting: Internal Medicine

## 2008-09-26 ENCOUNTER — Ambulatory Visit: Payer: Self-pay | Admitting: Internal Medicine

## 2008-09-26 LAB — CONVERTED CEMR LAB
Bilirubin Urine: NEGATIVE
Blood Glucose, Fingerstick: 88
Hemoglobin, Urine: NEGATIVE
Hgb A1c MFr Bld: 5.5 %
Ketones, ur: NEGATIVE mg/dL
Leukocytes, UA: NEGATIVE
Nitrite: NEGATIVE
Protein, ur: NEGATIVE mg/dL
Specific Gravity, Urine: 1.016 (ref 1.005–1.03)
Urine Glucose: NEGATIVE mg/dL
Urobilinogen, UA: 0.2 (ref 0.0–1.0)
pH: 5.5 (ref 5.0–8.0)

## 2008-09-27 ENCOUNTER — Encounter: Payer: Self-pay | Admitting: Internal Medicine

## 2008-10-15 ENCOUNTER — Telehealth: Payer: Self-pay | Admitting: Infectious Diseases

## 2008-10-31 ENCOUNTER — Ambulatory Visit: Payer: Self-pay | Admitting: Internal Medicine

## 2008-10-31 ENCOUNTER — Encounter: Payer: Self-pay | Admitting: Internal Medicine

## 2008-10-31 DIAGNOSIS — M629 Disorder of muscle, unspecified: Secondary | ICD-10-CM

## 2008-11-03 LAB — CONVERTED CEMR LAB
BUN: 18 mg/dL (ref 6–23)
Basophils Absolute: 0 10*3/uL (ref 0.0–0.1)
Eosinophils Relative: 1 % (ref 0–5)
HCT: 38.7 % (ref 36.0–46.0)
Lymphocytes Relative: 43 % (ref 12–46)
Lymphs Abs: 2.8 10*3/uL (ref 0.7–4.0)
Neutro Abs: 3.2 10*3/uL (ref 1.7–7.7)
Neutrophils Relative %: 49 % (ref 43–77)
Platelets: 254 10*3/uL (ref 150–400)
Potassium: 4.5 meq/L (ref 3.5–5.3)
RDW: 23.6 % — ABNORMAL HIGH (ref 11.5–15.5)
WBC: 6.6 10*3/uL (ref 4.0–10.5)

## 2008-11-08 ENCOUNTER — Encounter: Payer: Self-pay | Admitting: Internal Medicine

## 2008-11-12 ENCOUNTER — Encounter: Payer: Self-pay | Admitting: Internal Medicine

## 2008-11-13 ENCOUNTER — Ambulatory Visit (HOSPITAL_COMMUNITY): Admission: RE | Admit: 2008-11-13 | Discharge: 2008-11-13 | Payer: Self-pay | Admitting: Internal Medicine

## 2008-11-19 ENCOUNTER — Encounter: Admission: RE | Admit: 2008-11-19 | Discharge: 2008-12-12 | Payer: Self-pay | Admitting: Internal Medicine

## 2008-11-26 ENCOUNTER — Ambulatory Visit: Payer: Self-pay | Admitting: Internal Medicine

## 2008-11-26 ENCOUNTER — Encounter: Payer: Self-pay | Admitting: Internal Medicine

## 2008-11-26 DIAGNOSIS — E538 Deficiency of other specified B group vitamins: Secondary | ICD-10-CM | POA: Insufficient documentation

## 2008-11-26 DIAGNOSIS — E559 Vitamin D deficiency, unspecified: Secondary | ICD-10-CM

## 2008-11-26 LAB — CONVERTED CEMR LAB
AST: 14 units/L (ref 0–37)
Albumin: 4.5 g/dL (ref 3.5–5.2)
Alkaline Phosphatase: 77 units/L (ref 39–117)
Calcium: 10 mg/dL (ref 8.4–10.5)
Chloride: 102 meq/L (ref 96–112)
Glucose, Bld: 104 mg/dL — ABNORMAL HIGH (ref 70–99)
LDL Cholesterol: 142 mg/dL — ABNORMAL HIGH (ref 0–99)
Potassium: 4.7 meq/L (ref 3.5–5.3)
Sodium: 139 meq/L (ref 135–145)
Total Protein: 8 g/dL (ref 6.0–8.3)
Triglycerides: 211 mg/dL — ABNORMAL HIGH (ref <150)
Vit D, 1,25-Dihydroxy: 34 (ref 30–89)

## 2008-12-02 ENCOUNTER — Encounter: Payer: Self-pay | Admitting: Internal Medicine

## 2008-12-11 ENCOUNTER — Encounter: Payer: Self-pay | Admitting: Internal Medicine

## 2008-12-11 ENCOUNTER — Ambulatory Visit: Payer: Self-pay | Admitting: Internal Medicine

## 2008-12-11 DIAGNOSIS — E785 Hyperlipidemia, unspecified: Secondary | ICD-10-CM

## 2008-12-13 LAB — CONVERTED CEMR LAB
BUN: 27 mg/dL — ABNORMAL HIGH (ref 6–23)
CO2: 26 meq/L (ref 19–32)
Chloride: 104 meq/L (ref 96–112)
Glucose, Bld: 75 mg/dL (ref 70–99)
Potassium: 4.9 meq/L (ref 3.5–5.3)

## 2008-12-20 ENCOUNTER — Encounter: Payer: Self-pay | Admitting: Internal Medicine

## 2008-12-25 ENCOUNTER — Ambulatory Visit (HOSPITAL_COMMUNITY): Admission: RE | Admit: 2008-12-25 | Discharge: 2008-12-25 | Payer: Self-pay | Admitting: Internal Medicine

## 2008-12-25 ENCOUNTER — Ambulatory Visit: Payer: Self-pay | Admitting: Surgery

## 2008-12-25 ENCOUNTER — Encounter: Payer: Self-pay | Admitting: Internal Medicine

## 2008-12-30 ENCOUNTER — Encounter: Payer: Self-pay | Admitting: Internal Medicine

## 2009-01-30 ENCOUNTER — Encounter: Payer: Self-pay | Admitting: Internal Medicine

## 2009-02-24 ENCOUNTER — Ambulatory Visit: Payer: Self-pay | Admitting: Internal Medicine

## 2009-02-24 ENCOUNTER — Encounter: Payer: Self-pay | Admitting: Internal Medicine

## 2009-02-24 LAB — CONVERTED CEMR LAB: Blood Glucose, Fingerstick: 97

## 2009-03-12 ENCOUNTER — Encounter: Payer: Self-pay | Admitting: Internal Medicine

## 2009-03-18 ENCOUNTER — Encounter: Payer: Self-pay | Admitting: Internal Medicine

## 2009-03-19 ENCOUNTER — Encounter (INDEPENDENT_AMBULATORY_CARE_PROVIDER_SITE_OTHER): Payer: Self-pay | Admitting: *Deleted

## 2009-03-19 ENCOUNTER — Ambulatory Visit: Payer: Self-pay | Admitting: Infectious Disease

## 2009-03-19 DIAGNOSIS — M79609 Pain in unspecified limb: Secondary | ICD-10-CM

## 2009-04-01 ENCOUNTER — Encounter: Payer: Self-pay | Admitting: Internal Medicine

## 2009-04-03 ENCOUNTER — Telehealth: Payer: Self-pay | Admitting: *Deleted

## 2009-04-21 ENCOUNTER — Telehealth: Payer: Self-pay | Admitting: *Deleted

## 2009-04-29 ENCOUNTER — Ambulatory Visit: Payer: Self-pay | Admitting: Internal Medicine

## 2009-06-05 ENCOUNTER — Ambulatory Visit: Payer: Self-pay | Admitting: Internal Medicine

## 2009-06-05 DIAGNOSIS — M545 Low back pain, unspecified: Secondary | ICD-10-CM | POA: Insufficient documentation

## 2009-06-05 DIAGNOSIS — M542 Cervicalgia: Secondary | ICD-10-CM

## 2009-06-05 LAB — CONVERTED CEMR LAB
ALT: 15 units/L (ref 0–35)
AST: 17 units/L (ref 0–37)
Albumin: 4.4 g/dL (ref 3.5–5.2)
Anti Nuclear Antibody(ANA): NEGATIVE
Blood Glucose, Fingerstick: 88
Calcium: 10 mg/dL (ref 8.4–10.5)
Chloride: 102 meq/L (ref 96–112)
Hgb A1c MFr Bld: 7.8 %
Potassium: 4.8 meq/L (ref 3.5–5.3)
Rhuematoid fact SerPl-aCnc: 21 intl units/mL — ABNORMAL HIGH (ref 0–20)
Sodium: 137 meq/L (ref 135–145)
Total Protein: 8.1 g/dL (ref 6.0–8.3)

## 2009-06-12 ENCOUNTER — Encounter: Payer: Self-pay | Admitting: Internal Medicine

## 2009-06-23 ENCOUNTER — Telehealth: Payer: Self-pay | Admitting: Internal Medicine

## 2009-07-03 ENCOUNTER — Encounter: Admission: RE | Admit: 2009-07-03 | Discharge: 2009-10-01 | Payer: Self-pay | Admitting: Internal Medicine

## 2009-09-29 ENCOUNTER — Telehealth: Payer: Self-pay | Admitting: Internal Medicine

## 2009-10-15 ENCOUNTER — Emergency Department (HOSPITAL_COMMUNITY): Admission: EM | Admit: 2009-10-15 | Discharge: 2009-10-15 | Payer: Self-pay | Admitting: Family Medicine

## 2009-10-15 ENCOUNTER — Telehealth (INDEPENDENT_AMBULATORY_CARE_PROVIDER_SITE_OTHER): Payer: Self-pay | Admitting: *Deleted

## 2009-10-16 ENCOUNTER — Ambulatory Visit: Payer: Self-pay | Admitting: Internal Medicine

## 2009-10-30 ENCOUNTER — Ambulatory Visit: Payer: Self-pay | Admitting: Internal Medicine

## 2009-10-30 ENCOUNTER — Telehealth: Payer: Self-pay | Admitting: Internal Medicine

## 2009-10-30 ENCOUNTER — Ambulatory Visit (HOSPITAL_COMMUNITY): Admission: RE | Admit: 2009-10-30 | Discharge: 2009-10-30 | Payer: Self-pay | Admitting: Internal Medicine

## 2009-10-30 DIAGNOSIS — R0789 Other chest pain: Secondary | ICD-10-CM | POA: Insufficient documentation

## 2009-10-30 LAB — CONVERTED CEMR LAB: Blood Glucose, Fingerstick: 106

## 2009-11-13 ENCOUNTER — Ambulatory Visit: Payer: Self-pay | Admitting: Cardiovascular Disease

## 2009-11-17 ENCOUNTER — Inpatient Hospital Stay (HOSPITAL_COMMUNITY): Admission: EM | Admit: 2009-11-17 | Discharge: 2009-11-19 | Payer: Self-pay | Admitting: Emergency Medicine

## 2009-11-17 ENCOUNTER — Telehealth: Payer: Self-pay | Admitting: Internal Medicine

## 2009-11-17 ENCOUNTER — Telehealth: Payer: Self-pay | Admitting: Cardiovascular Disease

## 2009-11-17 ENCOUNTER — Ambulatory Visit: Payer: Self-pay | Admitting: Cardiology

## 2009-11-18 ENCOUNTER — Encounter (HOSPITAL_COMMUNITY): Admission: RE | Admit: 2009-11-18 | Discharge: 2010-01-28 | Payer: Self-pay | Admitting: Cardiovascular Disease

## 2009-11-20 ENCOUNTER — Telehealth: Payer: Self-pay | Admitting: Internal Medicine

## 2009-11-23 ENCOUNTER — Emergency Department (HOSPITAL_COMMUNITY): Admission: EM | Admit: 2009-11-23 | Discharge: 2009-11-23 | Payer: Self-pay | Admitting: Emergency Medicine

## 2009-11-25 ENCOUNTER — Ambulatory Visit: Payer: Self-pay | Admitting: Internal Medicine

## 2009-11-28 ENCOUNTER — Telehealth (INDEPENDENT_AMBULATORY_CARE_PROVIDER_SITE_OTHER): Payer: Self-pay | Admitting: *Deleted

## 2009-11-29 ENCOUNTER — Emergency Department (HOSPITAL_COMMUNITY): Admission: EM | Admit: 2009-11-29 | Discharge: 2009-11-29 | Payer: Self-pay | Admitting: Emergency Medicine

## 2009-11-29 ENCOUNTER — Encounter: Payer: Self-pay | Admitting: Internal Medicine

## 2009-12-01 ENCOUNTER — Telehealth: Payer: Self-pay | Admitting: Internal Medicine

## 2009-12-03 ENCOUNTER — Encounter: Payer: Self-pay | Admitting: Cardiovascular Disease

## 2009-12-04 ENCOUNTER — Ambulatory Visit: Payer: Self-pay | Admitting: Cardiology

## 2009-12-04 ENCOUNTER — Encounter: Payer: Self-pay | Admitting: Physician Assistant

## 2009-12-18 ENCOUNTER — Ambulatory Visit: Payer: Self-pay | Admitting: Internal Medicine

## 2009-12-18 LAB — CONVERTED CEMR LAB
AST: 11 units/L (ref 0–37)
Albumin: 4.5 g/dL (ref 3.5–5.2)
Alkaline Phosphatase: 65 units/L (ref 39–117)
BUN: 19 mg/dL (ref 6–23)
Basophils Absolute: 0 10*3/uL (ref 0.0–0.1)
Eosinophils Absolute: 0.2 10*3/uL (ref 0.0–0.7)
Lymphocytes Relative: 49 % — ABNORMAL HIGH (ref 12–46)
Lymphs Abs: 3.9 10*3/uL (ref 0.7–4.0)
MCV: 76.8 fL — ABNORMAL LOW (ref >=78.0)
Neutrophils Relative %: 44 % (ref 43–77)
Platelets: 281 10*3/uL (ref 150–400)
Potassium: 4.4 meq/L (ref 3.5–5.3)
RDW: 17.3 % — ABNORMAL HIGH (ref 11.5–15.5)
Total Bilirubin: 0.3 mg/dL (ref 0.3–1.2)
WBC: 8.1 10*3/uL (ref 4.0–10.5)

## 2009-12-19 ENCOUNTER — Encounter: Admission: RE | Admit: 2009-12-19 | Discharge: 2009-12-19 | Payer: Self-pay | Admitting: Gastroenterology

## 2009-12-19 ENCOUNTER — Encounter: Payer: Self-pay | Admitting: Internal Medicine

## 2009-12-22 ENCOUNTER — Encounter: Payer: Self-pay | Admitting: Internal Medicine

## 2009-12-26 ENCOUNTER — Telehealth: Payer: Self-pay | Admitting: Internal Medicine

## 2009-12-30 ENCOUNTER — Encounter: Payer: Self-pay | Admitting: Internal Medicine

## 2009-12-30 ENCOUNTER — Ambulatory Visit (HOSPITAL_COMMUNITY): Admission: RE | Admit: 2009-12-30 | Discharge: 2009-12-30 | Payer: Self-pay | Admitting: Internal Medicine

## 2009-12-30 ENCOUNTER — Ambulatory Visit: Payer: Self-pay | Admitting: Cardiovascular Disease

## 2010-01-01 ENCOUNTER — Ambulatory Visit (HOSPITAL_COMMUNITY): Admission: RE | Admit: 2010-01-01 | Discharge: 2010-01-01 | Payer: Self-pay | Admitting: Gastroenterology

## 2010-01-29 ENCOUNTER — Ambulatory Visit: Payer: Self-pay | Admitting: Cardiovascular Disease

## 2010-02-02 ENCOUNTER — Telehealth (INDEPENDENT_AMBULATORY_CARE_PROVIDER_SITE_OTHER): Payer: Self-pay | Admitting: *Deleted

## 2010-02-03 ENCOUNTER — Encounter (HOSPITAL_COMMUNITY): Admission: RE | Admit: 2010-02-03 | Discharge: 2010-03-25 | Payer: Self-pay | Admitting: Cardiovascular Disease

## 2010-02-03 ENCOUNTER — Ambulatory Visit: Payer: Self-pay | Admitting: Cardiology

## 2010-02-03 ENCOUNTER — Ambulatory Visit: Payer: Self-pay

## 2010-02-05 ENCOUNTER — Ambulatory Visit: Payer: Self-pay | Admitting: Internal Medicine

## 2010-02-05 LAB — CONVERTED CEMR LAB
Blood Glucose, Fingerstick: 79
Hgb A1c MFr Bld: 6.6 %

## 2010-03-04 ENCOUNTER — Encounter: Payer: Self-pay | Admitting: Cardiology

## 2010-03-10 ENCOUNTER — Ambulatory Visit: Payer: Self-pay | Admitting: Cardiology

## 2010-03-10 DIAGNOSIS — I2510 Atherosclerotic heart disease of native coronary artery without angina pectoris: Secondary | ICD-10-CM

## 2010-05-21 ENCOUNTER — Telehealth: Payer: Self-pay | Admitting: Internal Medicine

## 2010-05-21 ENCOUNTER — Ambulatory Visit: Payer: Self-pay | Admitting: Internal Medicine

## 2010-05-21 LAB — CONVERTED CEMR LAB
Blood Glucose, Fingerstick: 134
Hgb A1c MFr Bld: 6.3 %

## 2010-08-12 ENCOUNTER — Ambulatory Visit: Payer: Self-pay | Admitting: Internal Medicine

## 2010-08-12 LAB — CONVERTED CEMR LAB
Blood Glucose, Fingerstick: 96
Calcium: 10.3 mg/dL (ref 8.4–10.5)
Glucose, Bld: 71 mg/dL (ref 70–99)
Potassium: 4.5 meq/L (ref 3.5–5.3)
Sodium: 140 meq/L (ref 135–145)

## 2010-08-14 ENCOUNTER — Telehealth: Payer: Self-pay | Admitting: Internal Medicine

## 2010-08-17 ENCOUNTER — Ambulatory Visit (HOSPITAL_COMMUNITY): Admission: RE | Admit: 2010-08-17 | Discharge: 2010-08-17 | Payer: Self-pay | Admitting: Internal Medicine

## 2010-08-17 ENCOUNTER — Encounter: Payer: Self-pay | Admitting: Internal Medicine

## 2010-08-17 ENCOUNTER — Telehealth (INDEPENDENT_AMBULATORY_CARE_PROVIDER_SITE_OTHER): Payer: Self-pay | Admitting: *Deleted

## 2010-08-17 ENCOUNTER — Ambulatory Visit: Payer: Self-pay | Admitting: Internal Medicine

## 2010-08-18 ENCOUNTER — Telehealth: Payer: Self-pay | Admitting: Internal Medicine

## 2010-11-06 ENCOUNTER — Encounter: Payer: Self-pay | Admitting: Internal Medicine

## 2010-11-19 ENCOUNTER — Ambulatory Visit: Admit: 2010-11-19 | Payer: Self-pay | Admitting: Internal Medicine

## 2010-11-20 ENCOUNTER — Ambulatory Visit
Admission: RE | Admit: 2010-11-20 | Discharge: 2010-11-20 | Payer: Self-pay | Source: Home / Self Care | Attending: Internal Medicine | Admitting: Internal Medicine

## 2010-11-20 ENCOUNTER — Telehealth: Payer: Self-pay | Admitting: *Deleted

## 2010-11-20 DIAGNOSIS — M84376A Stress fracture, unspecified foot, initial encounter for fracture: Secondary | ICD-10-CM | POA: Insufficient documentation

## 2010-11-20 DIAGNOSIS — M4716 Other spondylosis with myelopathy, lumbar region: Secondary | ICD-10-CM | POA: Insufficient documentation

## 2010-11-20 LAB — GLUCOSE, CAPILLARY: Glucose-Capillary: 94 mg/dL (ref 70–99)

## 2010-11-20 LAB — CONVERTED CEMR LAB: Blood Glucose, AC Bkfst: 94 mg/dL

## 2010-11-24 NOTE — Miscellaneous (Signed)
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 Matthias Hughs Removed medication of METFORMIN HCL 1000 MG TABS (METFORMIN HCL) Take 1 tablet by mouth two times a day

## 2010-11-24 NOTE — Assessment & Plan Note (Signed)
Summary: ROUTINE CK/EST/VS   Vital Signs:  Patient profile:   60 year old female Height:      65 inches (165.10 cm) Weight:      201.01 pounds (91.37 kg) BMI:     33.57 Temp:     98.2 degrees F (36.78 degrees C) oral Pulse rate:   84 / minute BP sitting:   145 / 81  (left arm)  Vitals Entered By: Angelina Ok RN (October 30, 2009 9:53 AM) CC: Depression, CHF Management Is Patient Diabetic? Yes Did you bring your meter with you today? Yes Pain Assessment Patient in pain? no      Nutritional Status BMI of > 30 = obese CBG Result 106  Have you ever been in a relationship where you felt threatened, hurt or afraid?No   Does patient need assistance? Functional Status Self care Ambulation Normal Comments Check up.  Needs refills.   Primary Care Provider:  [name]  DO  CC:  Depression and CHF Management.  History of Present Illness: Ms. Pals come in for routine follow up on her DM and HNT. 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 She described numbness in pain in her hands and legs, bilateral and symetric, worse with staning or with washing dishes. Incidentally, at teh end of her visit she tell me that she has been having intermittent chest pain. Seems related to movement, but she says she can get it while at rest. It is located over her chest diffusely and radiates into her shoulders bilaterally. She describes it as pressure and sometimes "burning". No reflux symptoms associated.  Depression History:      The patient denies a depressed mood most of the day and a diminished interest in her usual daily activities.        The patient denies that she feels like life is not worth living, denies that she wishes that she were dead, and denies that she has thought about ending her life.        Comments:  Has good and bad days.    Preventive Screening-Counseling & Management  Alcohol-Tobacco     Smoking Status: quit     Year Quit: 10 years  ago  Allergies: 1)  ! Penicillin 2)  ! Sulfa 3)  ! Tagamet Hb (Cimetidine)  Physical Exam  General:  alert, well-developed, and well-nourished.   Lungs:  normal respiratory effort, no intercostal retractions, no accessory muscle use, and normal breath sounds.   Heart:  normal rate and regular rhythm.   Abdomen:  soft, non-tender, normal bowel sounds, and no distention.   Msk:  no joint swelling, no joint warmth, no redness over joints, no muscle atrophy, lumbar lordosis, SI joint tenderness, and trigger point tenderness.  calfs not swollen but diffusely ttp. Extremities:  no edema. Neurologic:  alert & oriented X3, cranial nerves II-XII intact, and strength normal in all extremities.   Skin:  mild cheeks erythema . Psych:  Oriented X3, memory intact for recent and remote, and normally interactive.     Impression & Recommendations:  Problem # 1:  HYPERTENSION (ICD-401.9) Higher than goal today. Will add norvasc. Max dose of Hyzaar. Recheck in 1 month.  Her updated medication list for this problem includes:    Hyzaar 100-25 Mg Tabs (Losartan potassium-hctz) .Marland Kitchen... Take 1 tablet by mouth once a day    Amlodipine Besylate 5 Mg Tabs (Amlodipine besylate) .Marland Kitchen... Take 1 tablet by mouth once a day  BP today:  145/81 Prior BP: 158/83 (10/16/2009)  Labs Reviewed: K+: 4.8 (06/05/2009) Creat: : 1.74 (06/05/2009)   Chol: 177 (06/05/2009)   HDL: 42 (06/05/2009)   LDL: 89 (06/05/2009)   TG: 230 (06/05/2009)  Problem # 2:  CHEST DISCOMFORT (ICD-786.59) I am concerned about her complaints of CP. She is not having pain currently. Her description is atypical, but with her risk factors of DM and Hypertension I will refer to cardiology for Stress testing/Myoview.  EKG today was normal.  Orders: 12 Lead EKG (12 Lead EKG) Cardiology Referral (Cardiology)  Problem # 3:  HAND PAIN, BILATERAL (ICD-729.5) Will use voltaren gel, this helped previously. Will try to avoid systemic NSAIDS in her.  She may  need 1st metacarpal injection for DeQuervains tenosynovitis if pain continues. There is also a componemnt of DM neuropathy to her complaints. Continue Gabapentin.  Complete Medication List: 1)  Wellbutrin Xl 300 Mg Xr24h-tab (Bupropion hcl) .... Take 1 tablet by mouth once a day per dr. Marney Setting 2)  Celexa 20 Mg Tabs (Citalopram hydrobromide) .Marland Kitchen.. 1 by mouth at bedtime per dr. Marney Setting 3)  Trazodone Hcl 150 Mg Tabs (Trazodone hcl) .... Take 1 tablet by mouth once a day at bedtime 4)  Novolin 70/30 70-30 % Susp (Insulin isophane & regular) .... Take 37 units in am and 26 units in pm as directed 5)  Prevacid 30 Mg Cpdr (Lansoprazole) .... Take 1 tablet by mouth once a day 6)  Truetrack Test Strp (Glucose blood) .... To test blood sugar 3 times per day 7)  Colace 100 Mg Caps (Docusate sodium) .... Take 1 tablet by mouth two times a day 8)  Drisdol 29528 Unit Caps (Ergocalciferol) .... Take one tab by mouth weekly. 9)  Lancing Device Misc (Lancet devices) .... Truetrac lancet device, lancet, test strips- testing - (2-3 times daily) 10)  Bd Safetyglide Insulin Syringe 30g X 5/16" 0.5 Ml Misc (Insulin syringe-needle u-100) .... Use as directed. 11)  Misoprostol 200 Mcg Tabs (Misoprostol) .... Take 1 tablet by mouth three times a day per dr. Matthias Hughs 12)  Simvastatin 20 Mg Tabs (Simvastatin) .Marland Kitchen.. 1 by mouth at bedtime 13)  Neurontin 300 Mg Caps (Gabapentin) .... Take 2 tabs in am and three tabs in pm 14)  Hyzaar 100-25 Mg Tabs (Losartan potassium-hctz) .... Take 1 tablet by mouth once a day 15)  Metformin Hcl 1000 Mg Tabs (Metformin hcl) .... Take 1 tablet by mouth two times a day 16)  Amlodipine Besylate 5 Mg Tabs (Amlodipine besylate) .... Take 1 tablet by mouth once a day 17)  Aspirin 81 Mg Tbec (Aspirin) .... Take one tablet by mouth daily  Other Orders: T-Hgb A1C (in-house) (41324MW) T- Capillary Blood Glucose (10272) Mammogram (Screening) (Mammo)  CHF Assessment/Plan:      The patient's  current weight is 201.01 pounds.  Her previous weight was 202.3 pounds.    Patient Instructions: 1)  Please schedule a follow-up appointment in 1 month. 2)  Referral to Cardiologist today. Prescriptions: VOLTAREN 1 % GEL (DICLOFENAC SODIUM) Apply to wrist two times a day as directed  #1 tube x 4   Entered and Authorized by:   [name]  DO   Signed by:   [name]  DO on 10/30/2009   Method used:   [facility]        [facility] [phone][facility] (retail)       [address]       [place], [place]  [address]  Ph: [phone] or [phone]       Fax: [phone]   RxID:   [id number] AMLODIPINE BESYLATE 5 MG TABS (AMLODIPINE BESYLATE) Take 1 tablet by mouth once a day  #30 x 3   Entered and Authorized by:   [name]  DO   Signed by:   [name]  DO on 10/30/2009   Method used:   [facility]        [facility] [phone]* (retail)       [address], [place]  [address]       Ph: [phone] or [phone]       Fax: [phone]   RxID:   [id number]   Prevention & Chronic Care Immunizations   Influenza vaccine: Fluvax MCR  (10/16/2009)   Influenza vaccine deferral: Deferred  (04/29/2009)   Influenza vaccine due: 06/25/2009    Tetanus booster: Not documented   Td booster deferral: Deferred  (04/29/2009)    Pneumococcal vaccine: Not documented  Colorectal Screening   Hemoccult: Not documented   Hemoccult action/deferral: Deferred  (04/29/2009)    Colonoscopy: Not documented   Colonoscopy action/deferral: Deferred  (04/29/2009)  Other Screening   Pap smear: Not documented   Pap smear action/deferral: Deferred-3 yr interval  (04/29/2009)    Mammogram: Not documented   Mammogram action/deferral: Ordered  (10/30/2009)   Smoking status: quit  (10/30/2009)    Screening comments: Patient has had colonoscopy 2010  Diabetes Mellitus   HgbA1C: 6.4  (10/30/2009)   Hemoglobin A1C due: 08/25/2007    Eye exam: No diabetic  retinopathy.     (12/25/2008)   Eye exam due: 12/2009    Foot exam: yes  (11/26/2008)   High risk foot: Not documented   Foot care education: 11/111/08  (06/05/2007)   Foot exam due: 09/05/2007    Urine microalbumin/creatinine ratio: 148.7  (05/25/2007)   Urine microalbumin action/deferral: Deferred   Urine microalbumin/cr due: 05/24/2008    Diabetes flowsheet reviewed?: Yes   Progress toward A1C goal: At goal  Lipids   Total Cholesterol: 177  (06/05/2009)   LDL: 89  (06/05/2009)   LDL Direct: Not documented   HDL: 42  (06/05/2009)   Triglycerides: 230  (06/05/2009)   Lipid panel due: 05/24/2008    SGOT (AST): 17  (06/05/2009)   SGPT (ALT): 15  (06/05/2009)   Alkaline phosphatase: 87  (06/05/2009)   Total bilirubin: 0.3  (06/05/2009)    Lipid flowsheet reviewed?: Yes   Progress toward LDL goal: Improved  Hypertension   Last Blood Pressure: 145 / 81  (10/30/2009)   Serum creatinine: 1.74  (06/05/2009)   Serum potassium 4.8  (06/05/2009)  Self-Management Support :    Patient will work on the following items until the next clinic visit to reach self-care goals:     Medications and monitoring: take my medicines every day, check my blood sugar, examine my feet every day  (10/30/2009)     Eating: drink diet soda or water instead of juice or soda, eat more vegetables, eat baked foods instead of fried foods  (10/30/2009)     Activity: take a 30 minute walk every day  (10/30/2009)     Other: try not to eat processed meats  (04/29/2009)    Diabetes self-management support: Copy of home glucose meter record  (10/30/2009)   Last diabetes self-management training by diabetes educator: 06/05/2007   Last medical nutrition therapy: 06/05/2007    Hypertension self-management support:  Not documented    Lipid self-management support: Not documented    Nursing Instructions: Schedule screening mammogram (see order)     Laboratory Results   Blood Tests   Date/Time Received:  October 30, 2009 10:17 AM  Date/Time Reported: Burke Keels  October 30, 2009 10:17 AM   HGBA1C: 6.4%   (Normal Range: Non-Diabetic - 3-6%   Control Diabetic - 6-8%) CBG Random:: 106mg /dL      Vital Signs:  Patient profile:   60 year old female Height:      65 inches (165.10 cm) Weight:      201.01 pounds (91.37 kg) BMI:     33.57 Temp:     98.2 degrees F (36.78 degrees C) oral Pulse rate:   84 / minute BP sitting:   145 / 81  (left arm)  Vitals Entered By: Angelina Ok RN (October 30, 2009 9:53 AM)

## 2010-11-24 NOTE — Letter (Signed)
Summary: Meter DownLoad  Meter DownLoad   Imported By: Florinda Marker 11/03/2009 15:40:41  _____________________________________________________________________  External Attachment:    Type:   Image     Comment:   External Document

## 2010-11-24 NOTE — Progress Notes (Signed)
Summary: patient request shorter pen needle rx/dmr  Phone Note Refill Request   Refills Requested: Medication #1:  BD PEN NEEDLE MINI U/F 31G X 5 MM MISC Inject twice daily as directed. Desires this prescription be sent to   Initial call taken by: Jamison Neighbor RD,CDE,  August 18, 2010 9:22 AM    New/Updated Medications: BD PEN NEEDLE MINI U/F 31G X 5 MM MISC (INSULIN PEN NEEDLE) Use to Inject insulin twice daily as directed Prescriptions: BD PEN NEEDLE MINI U/F 31G X 5 MM MISC (INSULIN PEN NEEDLE) Use to Inject insulin twice daily as directed  #100 x 11   Entered by:   Jamison Neighbor RD,CDE   Authorized by:   Mariea Stable MD   Signed by:   Mariea Stable MD on 08/18/2010   Method used:   Electronically to        [id number] [address] (retail)       [address]       [place], [place]  [id number]       Ph: [phone]       Fax: [phone]   RxID:   [id number]

## 2010-11-24 NOTE — Assessment & Plan Note (Signed)
Summary: Cardiology Nuclear Study  Nuclear Med Background Indications for Stress Test: Evaluation for Ischemia   History: COPD, Echo, Heart Catheterization, Myocardial Perfusion Study  History Comments: 11/18/09 Cath:RCA 60-70%, medical tx, normal LVF; 3/11 Echo:normal LVF; h/o OSA 2005- MPS- Ant. thinning. EF= 60%  Symptoms: Chest Pain, Chest Pressure, Chest Pressure with Exertion, Dizziness, Fatigue, Fatigue with Exertion    Nuclear Pre-Procedure Cardiac Risk Factors: Family History - CAD, History of Smoking, Hypertension, IDDM Type 2, Lipids Caffeine/Decaff Intake: none NPO After: 8:00 PM Lungs: Clear IV 0.9% NS with Angio Cath: 20g     IV Site: (R) Lat. upper arm IV Started by: [name] Kidney EMT-P Chest Size (in) 42     Cup Size D     Height (in): 65 Weight (lb): 192 BMI: 32.07  Nuclear Med Study 1 or 2 day study:  1 day     Stress Test Type:  Lexiscan Reading MD:  Marca Ancona, MD     Referring MD:  Charlton Haws Resting Radionuclide:  Technetium 54m Tetrofosmin     Resting Radionuclide Dose:  11.0 mCi  Stress Radionuclide:  Technetium 44m Tetrofosmin     Stress Radionuclide Dose:  33.0 mCi   Stress Protocol      Max HR:  110 bpm Max Systolic BP: 170 mm HgRate Pressure Product:  [address]  Lexiscan: 0.4 mg   Stress Test Technologist:  Irean Hong RN     Nuclear Technologist:  Domenic Polite CNMT  Rest Procedure  Myocardial perfusion imaging was performed at rest 45 minutes following the intravenous administration of Myoview Technetium 8m Tetrofosmin.  Stress Procedure  The patient received IV Lexiscan 0.4 mg over 15-seconds.  Myoview injected at 30-seconds.  There were no significant changes with Lexiscan.  Quantitative spect images were obtained after a 45 minute delay.  QPS Raw Data Images:  Normal; no motion artifact; normal heart/lung ratio. Stress Images:  NI: Uniform and normal uptake of tracer in all myocardial segments. Rest Images:  Normal homogeneous  uptake in all areas of the myocardium. Subtraction (SDS):  There is no evidence of scar or ischemia. Transient Ischemic Dilatation:  1.04  (Normal <1.22)  Lung/Heart Ratio:  .38  (Normal <0.45)  Quantitative Gated Spect Images QGS EDV:  83 ml QGS ESV:  34 ml QGS EF:  60 % QGS cine images:  Normal wall motion.    Overall Impression  Exercise Capacity: Lexiscan study.  BP Response: Normal blood pressure response. Clinical Symptoms: Short of breath.  ECG Impression: No significant ST segment change suggestive of ischemia. Overall Impression: Normal stress nuclear study.  Appended Document: Cardiology Nuclear Study normal nuclear study  Appended Document: Cardiology Nuclear Study pt aware of results

## 2010-11-24 NOTE — Progress Notes (Signed)
Summary: med change/gp  Phone Note From Pharmacy   Summary of Call: Pennsaid  Solution is not covered by pt.'s insurance. Do you want to change it or a prior authorization will have to be done?  Thanks Initial call taken by: Chinita Pester RN,  November 17, 2009 9:45 AM  Follow-up for Phone Call        Just have her use the Flector patch for now- I will discuss it at her next visit. Follow-up by: [name]  DO,  November 17, 2009 4:40 PM  Additional Follow-up for Phone Call Additional follow up Details #1::        [facility] Additional Follow-up by: Chinita Pester RN,  November 18, 2009 8:56 AM

## 2010-11-24 NOTE — Progress Notes (Signed)
Summary: Chest pain  Phone Note Call from Patient   Caller: Patient Summary of Call: Call from pt said that she was discharged is still having chest pain at a  level 8.  said that she had testing in the hospital that was negative.  No nausea or vomiting.  Pain is only in her chest.  Is not associated with activity.  Pt said that she is not getting relief from the Vicoodin at this time.  Wants to know what she should do. Angelina Ok RN  November 20, 2009 2:20 PM  Initial call taken by: Angelina Ok RN,  November 20, 2009 2:20 PM  Follow-up for Phone Call        RTC to pt after consulting Dr. Phillips Odor.  Pt is to continue taking her medications for pain as well as her medication for her stomach.  Pt  will be called either today or tomorow by Dr. Phillips Odor.  Pt voiced an understanding of plan and will continue to take her pain med as ordered. Follow-up by: Angelina Ok RN,  November 20, 2009 2:37 PM  Additional Follow-up for Phone Call Additional follow up Details #1::        pt calls, states she finally went to er and they "checked her heart out" and told her it was some type of pleurisy and she needed to f/u w/ her dr, appt was given at her request for 2/1 Additional Follow-up by: Marin Roberts RN,  November 25, 2009 11:42 AM

## 2010-11-24 NOTE — Progress Notes (Signed)
Summary: med change/gp  Phone Note From Pharmacy   [facility] [phone]* Summary of Call: Voltaren 1% gel is not cover by pt.'s insurance.  Do you want to change to something else?  Thanks Initial call taken by: Chinita Pester RN,  October 30, 2009 1:40 PM  Follow-up for Phone Call        Can you find out from teh pharmacy if the "Flector patch" is covered  or if there is a generic form that is covered? Doesnt have to be name brand. Otherwise there is no substitution- how much does it cost?? Follow-up by: [name]  DO,  November 03, 2009 12:25 PM  Additional Follow-up for Phone Call Additional follow up Details #1::        Flector patch is covered by pt.'s insurance. Additional Follow-up by: Chinita Pester RN,  November 03, 2009 4:10 PM    Additional Follow-up for Phone Call Additional follow up Details #2::    Will change to Flector patch  and send, but I am also going to see if Pennsaid another topical NSAID is covered. Follow-up by: [name]  DO,  November 07, 2009 10:22 AM  New/Updated Medications: FLECTOR 1.3 % PTCH (DICLOFENAC EPOLAMINE) Apply one patch twice daily to most painful areas as directed PENNSAID 1.5 % SOLN (DICLOFENAC SODIUM) Apply 20 gtts to affected/painful area twice daily as directed Prescriptions: PENNSAID 1.5 % SOLN (DICLOFENAC SODIUM) Apply 20 gtts to affected/painful area twice daily as directed  #1 bottle x 3   Entered and Authorized by:   [name]  DO   Signed by:   [name]  DO on 11/07/2009   Method used:   [facility] to        [facility] [phone][facility] (retail)       [address] [place]  [address]       Ph: [phone] or [phone]       Fax: [phone]   RxID:   [id number] FLECTOR 1.3 % PTCH (DICLOFENAC EPOLAMINE) Apply one patch twice daily to most painful areas as directed  #30 x 3   Entered and Authorized by:   [name]  DO   Signed by:   [name]  DO on 11/07/2009   Method used:    [facility]        [facility] [phone]* (retail)       [address]       [place], [place]  [address]       Ph: [phone] or [phone]       Fax: [phone]   RxID:   [id number]

## 2010-11-24 NOTE — Assessment & Plan Note (Signed)
Summary: per check out/sf   Visit Type:  Follow-up Primary Provider:  [name]  DO  CC:  CAD.  History of Present Illness: The patient for followup of moderate coronary artery disease as described below. She had a cardiac catheterization demonstrating this and a followup nuclear study suggesting no ischemia. Since she was last seen she has had no chest discomfort. She denies neck discomfort or arm discomfort. She's had no palpitations, presyncope or syncope. She denies any PND or orthopnea. She does not exercise as much as I would like.  Current Medications (verified): 1)  Wellbutrin Xl 300 Mg Xr24h-Tab (Bupropion Hcl) .... Take 1 Tablet By Mouth Once A Day Per Dr. Marney Setting 2)  Celexa 20 Mg Tabs (Citalopram Hydrobromide) .Marland Kitchen.. 1 By Mouth At Bedtime Per Dr. Marney Setting 3)  Trazodone Hcl 100 Mg Tabs (Trazodone Hcl) .Marland Kitchen.. 1-2 Tab At Bedtime 4)  Novolin 70/30 70-30 % Susp (Insulin Isophane & Regular) .... Take 37 Units in Am and 26 Units in Pm As Directed 5)  Dexilant 60 Mg Cpdr (Dexlansoprazole) .Marland Kitchen.. 1 Tab Once Daily 6)  Truetrack Test  Strp (Glucose Blood) .... To Test Blood Sugar 3 Times Per Day 7)  Colace 100 Mg Caps (Docusate Sodium) .... Take 1 Tablet By Mouth Two Times A Day 8)  Drisdol 10272 Unit Caps (Ergocalciferol) .... Take One Tab By Mouth Weekly. 9)  Lancing Device   Misc [employer] Devices) .... Truetrac Lancet Device, Lancet, Test Strips- Testing - (2-3 Times Daily) 10)  Bd Safetyglide Insulin Syringe 30g X 5/16" 0.5 Ml  Misc (Insulin Syringe-Needle U-100) .... Use As Directed. 11)  Simvastatin 20 Mg Tabs (Simvastatin) .Marland Kitchen.. 1 By Mouth At Bedtime 12)  Neurontin 800 Mg Tabs (Gabapentin) .... Take 1 Tablet By Mouth Two Times A Day 13)  Hyzaar 100-25 Mg Tabs (Losartan Potassium-Hctz) .... Take 1 Tablet By Mouth Once A Day 14)  Amlodipine Besylate 5 Mg Tabs (Amlodipine Besylate) .... Take 1 Tablet By Mouth Once A Day 15)  Aspirin 81 Mg Tbec (Aspirin) .... Take One Tablet By Mouth  Daily 16)  Isosorbide Mononitrate Cr 60 Mg Xr24h-Tab (Isosorbide Mononitrate) .... Take 1 Tablet By Mouth Once A Day 17)  Toprol Xl 25 Mg Xr24h-Tab (Metoprolol Succinate) .... Take 1/2 Tablet By Mouth Once A Day 18)  Diazepam 5 Mg Tabs (Diazepam) .... Take 1 Tablet By Mouth Every 12 Hours As Needed For Muscle Spasm and Anxiety 19)  Percocet 10-325 Mg Tabs (Oxycodone-Acetaminophen) .... Take 1 Tablet Twice Daily As Needed For Pain 20)  Misoprostol 200 Mcg Tabs (Misoprostol) .Marland Kitchen.. 1 Tab Three Times A Day 21)  Metformin Hcl 1000 Mg Tabs (Metformin Hcl) .Marland Kitchen.. 1 Tab Two Times A Day  Allergies (verified): 1)  ! Penicillin 2)  ! Sulfa 3)  ! Tagamet Hb (Cimetidine)  Past History:  Past Medical History: HYPERTENSION (ICD-401.9) CHEST PAIN (ICD-786.50) CHEST DISCOMFORT (ICD-786.59) PERIPHERAL NEUROPATHY (ICD-356.9) ALLERGY (ICD-995.3) CERVICALGIA (ICD-723.1) LOW BACK PAIN SYNDROME (ICD-724.2) HAND PAIN, BILATERAL (ICD-729.5) OTHER ACUTE SINUSITIS (ICD-461.8) HYPERLIPIDEMIA (ICD-272.4) (Chol:  227 LDL: 142  HDL: 43 Tri:     [11/26/2008] ) DIABETIC PERIPHERAL NEUROPATHY (ICD-250.60) LEG PAIN, BILATERAL (ICD-729.5) VITAMIN D DEFICIENCY (ICD-268.9) VITAMIN B12 DEFICIENCY (ICD-266.2) ILIOTIBIAL BAND SYNDROME, BILATERAL (ICD-728.89) ABDOMINAL PAIN (ICD-789.00) ASTHENIA (ICD-780.79) ANEMIA (ICD-285.9) NECK PAIN, ACUTE (ICD-723.1) BORDERLINE PERSONALITY DISORDER (ICD-301.83) 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 The second diagonal, where to 50% circumflex, 50%, RCA, 60-70% distal RCA, normal LV function, ejection fraction 65%.    Past Surgical History: Right ring finger surgery Hysterectomy  so really noticed blockage right earSugar well-controlled cholesterol we will control her blood pressure Review of Systems       As stated in the HPI and negative for all other systems.   Vital Signs:  Patient profile:   60 year old female Height:      65 inches Weight:      198 pounds BMI:     33.07 Pulse rate:   99 / minute Resp:     16 per minute BP sitting:   157 / 80  (right arm)  Vitals Entered By: Marrion Coy, CNA (Mar 10, 2010 11:08 AM)  Physical Exam  General:  Well developed, well nourished, in no acute distress. Head:  normocephalic and atraumatic Neck:  Neck supple, no JVD. No masses, thyromegaly or abnormal cervical nodes. Chest Wall:  no deformities or breast masses noted Lungs:  Clear bilaterally to auscultation and percussion. Heart:  Non-displaced PMI, chest non-tender; regular rate and rhythm, S1, S2 without murmurs, rubs or gallops. Carotid upstroke normal, no bruit. Normal abdominal aortic size, no bruits. Femorals normal pulses, no bruits. Pedals normal pulses. No edema, no varicosities. Abdomen:  Bowel sounds positive; abdomen soft and non-tender without masses, organomegaly, or hernias noted. No hepatosplenomegaly. Msk:  Back normal, normal gait. Muscle strength and tone normal. Extremities:  No clubbing or cyanosis. Neurologic:  Alert and oriented x 3. Skin:  Intact without lesions or rashes. Cervical Nodes:  no significant adenopathy Axillary Nodes:  no significant adenopathy Inguinal Nodes:  no significant adenopathy Psych:  Normal affect.   Impression & Recommendations:  Problem # 1:  CAD (ICD-414.00) The patient presents for followup. She had a negative stress perfusion study. She has no ongoing symptoms. We discussed risk reduction as described below.  Problem # 2:  HYPERTENSION (ICD-401.9) Her blood pressure is slightly elevated today. However, I went back and reviewed  previous readings and it is not elevated. Therefore, I will not make a change to her meds at this time.  Problem # 3:  DIABETES MELLITUS, TYPE II (ICD-250.00) Her last hemoglobin A1c was 6.6. She will remain on meds as listed.  Problem # 4:  HYPERLIPIDEMIA (ICD-272.4) She will remain on the meds as listed with an LDL of 71 and HDL of 34 in January. However, she understands that I want her to increase her walking to improve her HDL.  Patient Instructions: 1)  Your physician recommends that you schedule a follow-up appointment in: 12 months with Dr Antoine Poche 2)  Your physician recommends that you continue on your current medications as directed. Please refer to the Current Medication list given to you today.

## 2010-11-24 NOTE — Progress Notes (Signed)
Summary: chest pain/w/call if go to ER/nm  Phone Note Call from Patient Call back at Home Phone [phone]   Caller: Patient Reason for Call: Talk to Nurse, Talk to Doctor Summary of Call: pt having chest pain/lg Initial call taken by: Omer Jack,  November 17, 2009 2:43 PM  Follow-up for Phone Call        spoke with pt. She c/o of Chest pain and pressure . Pt. states she has been having chest pain on and off for about 2 months. She states  is schedule to have myoview next week for CP. This time the pain and pressure does not go away. Pt denies SOB, onr pain radiating to left and or back. Pt. states she is driving can't check her B/P. Pt. states does not have NTG medication nor given a prescription. Pt. was advice to go to ER. Pt states "I just call to get med. for pain". Again I advice pt. that she  could be having  a heart attack.She needs to go to the ER to be examined. Pt states" I'll think about it. I will call you back if I do". Ollen Gross, RN, BSN  November 17, 2009 3:06 PM   Additional Follow-up for Phone Call Additional follow up Details #1::        You were correct to tell her to go to the ER.  If she calls again same advice.  Her clinical presentation was low risk with history of fibromyalgia and normal ecg Additional Follow-up by: Colon Branch, MD, [facility],  November 17, 2009 4:56 PM

## 2010-11-24 NOTE — Assessment & Plan Note (Signed)
Summary: np3/ chestpain./pt has medicare/ gd   Primary Provider:  [name]  DO   History of Present Illness: [name] is seen today at the request of Dr Phillips Odor for SSCP.  She was evaluated in 2003 and had a normal echo and myovue.  She has had central SSCP for a couple of months.  It is atypical lasting minutes and not always exertional.  Nothing makes it better or worse.  It is persistant but not progressive.  She does not have associated dyspnea, diaphoresis or palpitaitons.  She is "disabled" from neuropathy, DM and fibromyalgia.  She can not walk on a treadmill.  Given her long-standing DM I think a F/U Lexiscan myovue would be in order.  She has been compliant with her BP meds.  I don't have a cholesterol on her.  I don't know what her latest HbA1c is.  She had an ECG done at [facility] on 11/04/09 which I reviewed.  It was normal with no evidence of ischemia, LVH or conduction abnormality  Current Problems (verified): 1)  Hypertension  (ICD-401.9) 2)  Chest Pain  (ICD-786.50) 3)  Chest Discomfort  (ICD-786.59) 4)  Peripheral Neuropathy  (ICD-356.9) 5)  Allergy  (ICD-995.3) 6)  Cervicalgia  (ICD-723.1) 7)  Cervicalgia  (ICD-723.1) 8)  Low Back Pain Syndrome  (ICD-724.2) 9)  Hand Pain, Bilateral  (ICD-729.5) 10)  Other Acute Sinusitis  (ICD-461.8) 11)  Hyperlipidemia  (ICD-272.4) 12)  Diabetic Peripheral Neuropathy  (ICD-250.60) 13)  Leg Pain, Bilateral  (ICD-729.5) 14)  Vitamin D Deficiency  (ICD-268.9) 15)  Vitamin B12 Deficiency  (ICD-266.2) 16)  Iliotibial Band Syndrome, Bilateral  (ICD-728.89) 17)  Abdominal Pain  (ICD-789.00) 18)  Asthenia  (ICD-780.79) 19)  Anemia  (ICD-285.9) 20)  Neck Pain, Acute  (ICD-723.1) 21)  Borderline Personality Disorder  (ICD-301.83) 22)  Nonorganic Psychosis Nos  (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 Take 1 Tablet By Mouth Once A Day Per Dr. Marney Setting 2)  Celexa 20 Mg Tabs (Citalopram Hydrobromide) .Marland Kitchen.. 1 By Mouth At Bedtime Per Dr. Marney Setting 3)  Trazodone Hcl 150 Mg  Tabs (Trazodone Hcl) .... Take 1 Tablet By Mouth Once A Day At Bedtime 4)  Novolin 70/30 70-30 % Susp (Insulin Isophane & Regular) .... Take 37 Units in Am and 26 Units in Pm As Directed 5)  Prevacid 30 Mg Cpdr (Lansoprazole) .... Take 1 Tablet By Mouth Once A Day 6)  Truetrack Test  Strp (Glucose Blood) .... To Test Blood Sugar 3 Times Per Day 7)  Colace 100 Mg Caps (Docusate Sodium) .... Take 1 Tablet By Mouth Two Times A Day 8)  Drisdol 29528 Unit Caps (Ergocalciferol) .... Take One Tab By Mouth Weekly. 9)  Lancing Device   Misc [employer] Devices) .... Truetrac Lancet Device, Lancet, Test Strips- Testing - (2-3 Times Daily) 10)  Bd Safetyglide Insulin Syringe 30g X 5/16" 0.5 Ml  Misc (Insulin Syringe-Needle U-100) .... Use As Directed. 11)  Misoprostol 200 Mcg Tabs (Misoprostol) .... Take 1 Tablet By Mouth Three Times A Day Per Dr. Matthias Hughs 12)  Simvastatin 20 Mg Tabs (Simvastatin) .Marland Kitchen.. 1 By Mouth At Bedtime 13)  Neurontin 300 Mg Caps (Gabapentin) .... Take 2 Tabs in Am and Three Tabs in Pm 14)  Hyzaar 100-25 Mg Tabs (Losartan Potassium-Hctz) .... Take 1 Tablet By Mouth Once A Day 15)  Metformin Hcl 1000 Mg Tabs (Metformin Hcl) .... Take 1 Tablet By Mouth Two Times A Day 16)  Amlodipine Besylate 5 Mg Tabs (Amlodipine Besylate) .... Take 1 Tablet By Mouth Once A Day 17)  Aspirin 81 Mg Tbec (Aspirin) .... Take One Tablet By Mouth Daily  Allergies (verified): 1)  ! Penicillin 2)  ! Sulfa 3)  ! Tagamet Hb (Cimetidine)  Past History:  Past Medical History: Last updated: 11/10/2009 Current Problems:  HYPERTENSION (ICD-401.9) CHEST PAIN (ICD-786.50) CHEST DISCOMFORT  (ICD-786.59) PERIPHERAL NEUROPATHY (ICD-356.9) ALLERGY (ICD-995.3) CERVICALGIA (ICD-723.1) CERVICALGIA (ICD-723.1) LOW BACK PAIN SYNDROME (ICD-724.2) HAND PAIN, BILATERAL (ICD-729.5) OTHER ACUTE SINUSITIS (ICD-461.8) HYPERLIPIDEMIA (ICD-272.4) (Chol:  227 LDL: 142  HDL: 43 Tri:     [11/26/2008] ) DIABETIC PERIPHERAL NEUROPATHY (ICD-250.60) LEG PAIN, BILATERAL (ICD-729.5) VITAMIN D DEFICIENCY (ICD-268.9) VITAMIN B12 DEFICIENCY (ICD-266.2) ILIOTIBIAL BAND SYNDROME, BILATERAL (ICD-728.89) ABDOMINAL PAIN (ICD-789.00) ASTHENIA (ICD-780.79) ANEMIA (ICD-285.9) NECK PAIN, ACUTE (ICD-723.1) BORDERLINE PERSONALITY DISORDER (ICD-301.83) 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 No ETOH No Tobacco (quit 2004; smoked 30 years 1/2ppd ==> 15pack year hx) No Drugs   Review of Systems       Denies fever, malais, weight loss, blurry vision, decreased visual acuity, cough, sputum, SOB, hemoptysis, pleuritic pain, palpitaitons, heartburn, abdominal pain, melena, lower extremity edema, claudication, or rash.   Vital Signs:  Patient profile:   60 year old female Height:      65 inches Weight:      199 pounds BMI:     33.24 Pulse rate:   75 / minute BP sitting:   128 / 76  (left arm) Cuff size:   regular  Vitals Entered By: Hardin Negus, RMA (November 13, 2009 9:09 AM)  Physical Exam  General:  Affect appropriate Healthy:  appears stated age HEENT: normal Neck supple with no adenopathy JVP normal no bruits no thyromegaly Lungs  clear with no wheezing and good diaphragmatic motion Heart:  S1/S2 no murmur,rub, gallop or click PMI normal Abdomen: benighn, BS positve, no tenderness, no AAA no bruit.  No HSM or HJR Distal pulses intact with no bruits No edema Neuro non-focal Skin warm and dry    Impression & Recommendations:  Problem # 1:  HYPERTENSION (ICD-401.9) Well controlled continue 2 gram sodium diet Her updated medication list for this problem includes:    Hyzaar 100-25 Mg Tabs (Losartan potassium-hctz) .Marland Kitchen... Take 1 tablet by mouth once a day    Amlodipine Besylate 5 Mg Tabs (Amlodipine besylate) .Marland Kitchen... Take 1 tablet by mouth once a day    Aspirin 81 Mg Tbec (Aspirin) .Marland Kitchen... Take one tablet by mouth daily  Problem # 2:  CHEST PAIN (ICD-786.50) Atypical but CRF include DM and HTN.  Lexiscan myovue Her updated medication list for this problem includes:    Amlodipine Besylate 5 Mg Tabs (Amlodipine besylate) .Marland Kitchen... Take 1 tablet by mouth once a day    Aspirin 81 Mg Tbec (Aspirin) .Marland Kitchen... Take one tablet by mouth daily  Problem # 3:  HYPERLIPIDEMIA (ICD-272.4) At target with no side  effects Her updated medication list for this problem includes:    Simvastatin 20 Mg Tabs (Simvastatin) .Marland Kitchen... 1 by mouth at bedtime  CHOL: 177 (06/05/2009)   LDL: 89 (06/05/2009)   HDL: 42 (06/05/2009)   TG: 230 (06/05/2009)  Other Orders: Nuclear Stress Test (Nuc Stress Test)  Patient Instructions: 1)  Your physician has requested that you have an lexiscan myoview.  For further information please visit [email or web address]  Please follow instruction sheet, as given. 2)  Your physician recommends that you schedule a follow-up appointment as needed with Dr Charlton Haws

## 2010-11-24 NOTE — Miscellaneous (Signed)
  Clinical Lists Changes  Observations: Added new observation of NUCLEAR NOS: Exercise Capacity: Lexiscan study.  BP Response: Normal blood pressure response. Clinical Symptoms: Short of breath.  ECG Impression: No significant ST segment change suggestive of ischemia. Overall Impression: Normal stress nuclear study.  (02/03/2010 15:08) Added new observation of ECHOINTERP:  - Left ventricle: The cavity size was mildly dilated. Wall thickness       was increased in a pattern of moderate LVH. Systolic function was       normal. The estimated ejection fraction was in the range of 55% to       60%.     - Aortic valve: Trivial regurgitation.     - Mitral valve: Mild regurgitation.     - Left atrium: The atrium was mildly dilated.     - Atrial septum: No defect or patent foramen ovale was identified.     - Pulmonary arteries: PA peak pressure: 35mm Hg (S). (12/30/2009 15:08)      Nuclear Study  Procedure date:  02/03/2010  Findings:      Exercise Capacity: Lexiscan study.  BP Response: Normal blood pressure response. Clinical Symptoms: Short of breath.  ECG Impression: No significant ST segment change suggestive of ischemia. Overall Impression: Normal stress nuclear study.   Echocardiogram  Procedure date:  12/30/2009  Findings:       - Left ventricle: The cavity size was mildly dilated. Wall thickness       was increased in a pattern of moderate LVH. Systolic function was       normal. The estimated ejection fraction was in the range of 55% to       60%.     - Aortic valve: Trivial regurgitation.     - Mitral valve: Mild regurgitation.     - Left atrium: The atrium was mildly dilated.     - Atrial septum: No defect or patent foramen ovale was identified.     - Pulmonary arteries: PA peak pressure: 35mm Hg (S).

## 2010-11-24 NOTE — Consult Note (Signed)
Summary: [facility]   Imported By: Florinda Marker 12/22/2009 16:31:08  _____________________________________________________________________  External Attachment:    Type:   Image     Comment:   External Document

## 2010-11-24 NOTE — Progress Notes (Signed)
Summary: phone/gg  Phone Note Call from Patient   Caller: Patient Summary of Call: Pt's last visit was 2/1 with same c/o as today.  She has been taking percocet and valium as ordered without any relief of pain. Chest still hurts and she has stress in shoulders and back. Pt denies nausea, sob or diaphoresis.   She is using heat to back area.   What else can she do to help the pain until seen by GI on 2/23?  Pt # [id number] Initial call taken by: Merrie Roof RN,  November 28, 2009 2:44 PM  Follow-up for Phone Call        she may need a chest CT at this point - i think she needs to go to the ED. Follow-up by: [name]  DO,  November 28, 2009 3:47 PM  Additional Follow-up for Phone Call Additional follow up Details #1::        Pt informed and will go to the ED Additional Follow-up by: Merrie Roof RN,  November 28, 2009 4:15 PM

## 2010-11-24 NOTE — Assessment & Plan Note (Signed)
Summary: eph/jml   Visit Type:  Follow-up Primary Provider:  [name]  DO  CC:  chest pain.  History of Present Illness: This is a 60 year old, African American female, patient, who is admitted to the hospital with recurrent chest pain. Cardiac catheterization November 18, 2009 revealed nonobstructive coronary disease with 60%. The second diagonal, where to 50% circumflex, 50%, RCA, 60-70% distal RCA, normal LV function, ejection fraction 65%. It was felt that she continued to have discomfort. They could consider further imaging such as IVUS or flow wire to the distal RCA versus a functional study. At this point. There was no objective evidence of ischemia and medical therapy was recommended.  The patient continues to have recurrent chest pain, and actually went back to the emergency room and felt she had some type of pleurisy. She describes the pain as a true that is constant and never goes away. She is convinced at this point. It is related to her esophagus and has an appointment with GI February 23. She denies chest tightness, heaviness, radiation of pain, dyspnea, dyspnea on exertion, dizziness, or presyncope. She is taking pain medications for it.The pain does not worsen with exertion and she is able to sleep comfortably.  Current Medications (verified): 1)  Wellbutrin Xl 300 Mg Xr24h-Tab (Bupropion Hcl) .... Take 1 Tablet By Mouth Once A Day Per Dr. Marney Setting 2)  Celexa 20 Mg Tabs (Citalopram Hydrobromide) .Marland Kitchen.. 1 By Mouth At Bedtime Per Dr. Marney Setting 3)  Trazodone Hcl 150 Mg  Tabs (Trazodone Hcl) .... Take 1 Tablet By Mouth Once A Day At Bedtime 4)  Novolin 70/30 70-30 % Susp (Insulin Isophane & Regular) .... Take 37 Units in Am and 26 Units in Pm As Directed 5)  Prevacid 30 Mg Cpdr (Lansoprazole) .... Take 1 Tablet By Mouth Once A Day 6)  Truetrack Test  Strp (Glucose Blood) .... To Test Blood Sugar 3 Times Per Day 7)  Colace 100 Mg Caps (Docusate Sodium) .... Take 1 Tablet By Mouth Two  Times A Day 8)  Drisdol 61607 Unit Caps (Ergocalciferol) .... Take One Tab By Mouth Weekly. 9)  Lancing Device   Misc [employer] Devices) .... Truetrac Lancet Device, Lancet, Test Strips- Testing - (2-3 Times Daily) 10)  Bd Safetyglide Insulin Syringe 30g X 5/16" 0.5 Ml  Misc (Insulin Syringe-Needle U-100) .... Use As Directed. 11)  Misoprostol 200 Mcg Tabs (Misoprostol) .... Take 1 Tablet By Mouth Three Times A Day Per Dr. Matthias Hughs 12)  Simvastatin 20 Mg Tabs (Simvastatin) .Marland Kitchen.. 1 By Mouth At Bedtime 13)  Neurontin 300 Mg Caps (Gabapentin) .... Take 2 Tabs in Am and Three Tabs in Pm 14)  Hyzaar 100-25 Mg Tabs (Losartan Potassium-Hctz) .... Take 1 Tablet By Mouth Once A Day 15)  Metformin Hcl 1000 Mg Tabs (Metformin Hcl) .... Take 1 Tablet By Mouth Two Times A Day 16)  Amlodipine Besylate 5 Mg Tabs (Amlodipine Besylate) .... Take 1 Tablet By Mouth Once A Day 17)  Aspirin 81 Mg Tbec (Aspirin) .... Take One Tablet By Mouth Daily 18)  Isosorbide Mononitrate Cr 60 Mg Xr24h-Tab (Isosorbide Mononitrate) .... Take 1 Tablet By Mouth Once A Day 19)  Toprol Xl 25 Mg Xr24h-Tab (Metoprolol Succinate) .... Take 1/2 Tablet By Mouth Once A Day 20)  Diazepam 5 Mg Tabs (Diazepam) .... Take 1 Tablet By Mouth Every 12 Hours As Needed For Muscle Spasm and Anxiety 21)  Percocet 10-325 Mg Tabs (Oxycodone-Acetaminophen) .... Take 1 Tablet Twice Daily As Needed For Pain  Allergies: 1)  ! Penicillin 2)  ! Sulfa 3)  ! Tagamet Hb (Cimetidine)  Past History:  Past Medical History: Last updated: 11/10/2009 Current Problems:  HYPERTENSION (ICD-401.9) CHEST PAIN (ICD-786.50) CHEST DISCOMFORT (ICD-786.59) PERIPHERAL NEUROPATHY (ICD-356.9) ALLERGY (ICD-995.3) CERVICALGIA (ICD-723.1) CERVICALGIA (ICD-723.1) LOW BACK PAIN SYNDROME (ICD-724.2) HAND PAIN, BILATERAL (ICD-729.5) OTHER ACUTE SINUSITIS (ICD-461.8) HYPERLIPIDEMIA (ICD-272.4) (Chol:  227 LDL: 142  HDL: 43 Tri:     [11/26/2008] ) DIABETIC PERIPHERAL NEUROPATHY  (ICD-250.60) LEG PAIN, BILATERAL (ICD-729.5) VITAMIN D DEFICIENCY (ICD-268.9) VITAMIN B12 DEFICIENCY (ICD-266.2) ILIOTIBIAL BAND SYNDROME, BILATERAL (ICD-728.89) ABDOMINAL PAIN (ICD-789.00) ASTHENIA (ICD-780.79) ANEMIA (ICD-285.9) NECK PAIN, ACUTE (ICD-723.1) 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 Married. No ETOH No Tobacco (quit 2004; smoked 30 years 1/2ppd ==> 15pack year hx) No Drugs   Review of Systems       see history of present illness  Vital Signs:  Patient profile:   60 year old female Height:      65 inches Weight:      194 pounds Pulse rate:   80 / minute Pulse rhythm:   regular BP sitting:   108 / 78  (left arm)  Vitals Entered By: Jacquelin Hawking, CMA (December 04, 2009 10:49 AM)  Physical Exam  General:   Well-nournished, in no acute distress. Neck: No JVD, HJR, Bruit, or thyroid enlargement Lungs: No tachypnea, clear without wheezing, rales, or rhonchi Cardiovascular: RRR, PMI not displaced, heart sounds normal, no murmurs, gallops, bruit, thrill, or heave. Abdomen: BS normal. Soft without organomegaly, masses, lesions or tenderness. Extremities: Right groin without hematoma or hemorrhage,without cyanosis, clubbing or edema. Good distal pulses bilateral SKin: Warm, no lesions or rashes  Musculoskeletal: No deformities Neuro: no focal signs    EKG  Procedure date:  12/04/2009  Findings:      normal sinus rhythm  Impression & Recommendations:  Problem # 1:  CHEST PAIN (ICD-786.50) Patient continues to have constant chest pain. It does not sound cardiac at this  point. If she continues to have pain after she is seen by GI., further testing may be indicated such as a functional study or IVUS were Flow wire to the distal RCA. Her updated medication list for this problem includes:    Amlodipine Besylate 5 Mg Tabs (Amlodipine besylate) .Marland Kitchen... Take 1 tablet by mouth once a day    Aspirin 81 Mg Tbec (Aspirin) .Marland Kitchen... Take one tablet by mouth daily    Isosorbide Mononitrate Cr 60 Mg Xr24h-tab (Isosorbide mononitrate) .Marland Kitchen... Take 1 tablet by mouth once a day    Toprol Xl 25 Mg Xr24h-tab (Metoprolol succinate) .Marland Kitchen... Take 1/2 tablet by mouth once a day  Orders: EKG w/ Interpretation (93000)  Problem # 2:  CORONARY ARTERY ANEURYSM (ICD-414.11) Please see history of present illness.  Problem # 3:  HYPERTENSION (ICD-401.9) Blood pressure control Her updated medication list for this problem includes:    Hyzaar 100-25 Mg Tabs (Losartan potassium-hctz) .Marland Kitchen... Take 1 tablet by mouth once a day    Amlodipine Besylate 5 Mg Tabs (Amlodipine besylate) .Marland Kitchen... Take 1 tablet by mouth once a day    Aspirin 81 Mg Tbec (Aspirin) .Marland Kitchen... Take one tablet by mouth daily    Toprol Xl 25 Mg Xr24h-tab (Metoprolol succinate) .Marland KitchenMarland KitchenMarland KitchenMarland Kitchen  Take 1/2 tablet by mouth once a day  Patient Instructions: 1)  Your physician recommends that you schedule a follow-up appointment in: 2 months with Dr. Eden Emms

## 2010-11-24 NOTE — Progress Notes (Signed)
Summary: Nuclear pre procedure  Phone Note Outgoing Call Call back at [facility] Phone [phone]   Call placed by: Rea College, CMA,  February 02, 2010 4:14 PM Call placed to: Patient Summary of Call: Reviewed information on [employer] Information Sheet (see scanned document for further details).  Spoke with patient.      Nuclear Med Background Indications for Stress Test: Evaluation for Ischemia   History: COPD, Echo, Heart Catheterization  History Comments: 11/18/09 Cath:RCA 60-70%, medical tx, normal LVF; 3/11 Echo:normal LVF; h/o OSA  Symptoms: Chest Pain    Nuclear Pre-Procedure Cardiac Risk Factors: Family History - CAD, History of Smoking, Hypertension, IDDM Type 2, Lipids Height (in): 65

## 2010-11-24 NOTE — Assessment & Plan Note (Signed)
Summary: f/u [[name]]   Vital Signs:  Patient profile:   60 year old female Height:      65 inches Weight:      195.3 pounds BMI:     32.62 Temp:     97.7 degrees F oral Pulse rate:   80 / minute BP sitting:   113 / 71  (left arm)  Vitals Entered By: Blenda Mounts (December 18, 2009 10:26 AM)Gladys Herbin, RN December 18, 2009 10:26 AM CC: chest pain, Depression Is Patient Diabetic? Yes Did you bring your meter with you today? Yes Pain Assessment Patient in pain? yes     Location: chest Intensity: 2 Type: pressure Onset of pain  Constant Nutritional Status BMI of > 30 = obese  Have you ever been in a relationship where you felt threatened, hurt or afraid?No   Does patient need assistance? Functional Status Self care Ambulation Normal   CC:  chest pain and Depression.  Depression History:      The patient is having a depressed mood most of the day and has a diminished interest in her usual daily activities.        The patient denies that she feels like life is not worth living, denies that she wishes that she were dead, and denies that she has thought about ending her life.         Preventive Screening-Counseling & Management  Alcohol-Tobacco     Alcohol drinks/day: no     Smoking Status: quit     Year Quit: 10 years ago  Caffeine-Diet-Exercise     Does Patient Exercise: no  Allergies: 1)  ! Penicillin 2)  ! Sulfa 3)  ! Tagamet Hb (Cimetidine)   Impression & Recommendations:  Problem # 1:  HYPERTENSION (ICD-401.9) Stable at goal. Her updated medication list for this problem includes:    Hyzaar 100-25 Mg Tabs (Losartan potassium-hctz) .Marland Kitchen... Take 1 tablet by mouth once a day    Amlodipine Besylate 5 Mg Tabs (Amlodipine besylate) .Marland Kitchen... Take 1 tablet by mouth once a day    Toprol Xl 25 Mg Xr24h-tab (Metoprolol succinate) .Marland Kitchen... Take 1/2 tablet by mouth once a day  BP today: 113/71 Prior BP: 108/78 (12/04/2009)  Labs Reviewed: K+: 4.8  (06/05/2009) Creat: : 1.74 (06/05/2009)   Chol: 177 (06/05/2009)   HDL: 42 (06/05/2009)   LDL: 89 (06/05/2009)   TG: 230 (06/05/2009)  Problem # 2:  CHEST DISCOMFORT (ICD-786.59) Now a Chronic problem. I feel strongly that we should continue to search for teh underlying cause and that treating this with opiates is only short term. Will see if endoscopy can be done sooner. I am also going to get an echocardiogram since there was a question of pericardial fluid-inflammation on the CT scan done in the ED a few weeks ago. Additionally I will check ESR-CRP. If ESR is elevated I may consider a brief course of steroids for possible costochondritis. Will avoid NSAIDS since she has some renal insufficiency and hx of gastritis. Dr. Ronney Asters has initiated a work up of GB disease. Her LFTs have been normal in the past. RUQ [place] pending.     Orders T-C-Reactive Protein [id number]) T-CBC w/Diff [id number]) T-Comprehensive Metabolic Panel [id number]) T-Sed Rate (Automated) [phone]) 2 D Echo (2 D Echo)  Complete Medication List: 1)  Wellbutrin Xl 300 Mg Xr24h-tab (Bupropion hcl) .... Take 1 tablet by mouth once a day per dr. Marney Setting 2)  Celexa 20 Mg Tabs (Citalopram hydrobromide) .Marland Kitchen.. 1 by mouth at bedtime per  dr. Marney Setting 3)  Trazodone Hcl 150 Mg Tabs (Trazodone hcl) .... Take 1 tablet by mouth once a day at bedtime 4)  Novolin 70/30 70-30 % Susp (Insulin isophane & regular) .... Take 37 units in am and 26 units in pm as directed 5)  Prevacid 30 Mg Cpdr (Lansoprazole) .... Take 1 tablet by mouth once a day 6)  Truetrack Test Strp (Glucose blood) .... To test blood sugar 3 times per day 7)  Colace 100 Mg Caps (Docusate sodium) .... Take 1 tablet by mouth two times a day 8)  Drisdol 81191 Unit Caps (Ergocalciferol) .... Take one tab by mouth weekly. 9)  Lancing Device Misc (Lancet devices) .... Truetrac lancet device, lancet, test strips- testing - (2-3 times daily) 10)  Bd Safetyglide Insulin Syringe  30g X 5/16" 0.5 Ml Misc (Insulin syringe-needle u-100) .... Use as directed. 11)  Misoprostol 200 Mcg Tabs (Misoprostol) .... Take 1 tablet by mouth three times a day per dr. Matthias Hughs 12)  Simvastatin 20 Mg Tabs (Simvastatin) .Marland Kitchen.. 1 by mouth at bedtime 13)  Neurontin 300 Mg Caps (Gabapentin) .... Take 2 tabs in am and three tabs in pm 14)  Hyzaar 100-25 Mg Tabs (Losartan potassium-hctz) .... Take 1 tablet by mouth once a day 15)  Metformin Hcl 1000 Mg Tabs (Metformin hcl) .... Take 1 tablet by mouth two times a day 16)  Amlodipine Besylate 5 Mg Tabs (Amlodipine besylate) .... Take 1 tablet by mouth once a day 17)  Aspirin 81 Mg Tbec (Aspirin) .... Take one tablet by mouth daily 18)  Isosorbide Mononitrate Cr 60 Mg Xr24h-tab (Isosorbide mononitrate) .... Take 1 tablet by mouth once a day 19)  Toprol Xl 25 Mg Xr24h-tab (Metoprolol succinate) .... Take 1/2 tablet by mouth once a day 20)  Diazepam 5 Mg Tabs (Diazepam) .... Take 1 tablet by mouth every 12 hours as needed for muscle spasm and anxiety 21)  Percocet 10-325 Mg Tabs (Oxycodone-acetaminophen) .... Take 1 tablet twice daily as needed for pain  Patient Instructions: 1)  Please schedule a follow-up appointment in 1 month. Prescriptions: DIAZEPAM 5 MG TABS (DIAZEPAM) Take 1 tablet by mouth every 12 hours as needed for muscle spasm and anxiety  #30 x 0   Entered and Authorized by:   [name]  DO   Signed by:   [name]  DO on 12/18/2009   Method used:   Print then Give to Patient   RxID:   [id number] PERCOCET 10-325 MG TABS (OXYCODONE-ACETAMINOPHEN) Take 1 tablet twice daily as needed for pain  #60 x 0   Entered and Authorized by:   [name]  DO   Signed by:   [name]  DO on 12/18/2009   Method used:   Print then Give to Patient   RxID:   [id number]   Prevention & Chronic Care Immunizations   Influenza vaccine: Fluvax MCR  (10/16/2009)   Influenza vaccine deferral: Deferred  (04/29/2009)   Influenza  vaccine due: 06/25/2009    Tetanus booster: Not documented   Td booster deferral: Deferred  (04/29/2009)    Pneumococcal vaccine: Not documented  Colorectal Screening   Hemoccult: Not documented   Hemoccult action/deferral: Deferred  (04/29/2009)    Colonoscopy: Not documented   Colonoscopy action/deferral: Deferred  (04/29/2009)  Other Screening   Pap smear: Not documented   Pap smear action/deferral: Deferred-3 yr interval  (11/25/2009)    Mammogram: Not documented   Mammogram action/deferral: Ordered  (10/30/2009)   Smoking status: quit  (  12/18/2009)  Diabetes Mellitus   HgbA1C: 6.4  (10/30/2009)   Hemoglobin A1C due: 08/25/2007    Eye exam: No diabetic retinopathy.     (12/25/2008)   Eye exam due: 12/2009    Foot exam: yes  (11/26/2008)   High risk foot: Not documented   Foot care education: 11/111/08  (06/05/2007)   Foot exam due: 09/05/2007    Urine microalbumin/creatinine ratio: 148.7  (05/25/2007)   Urine microalbumin action/deferral: Deferred   Urine microalbumin/cr due: 05/24/2008  Lipids   Total Cholesterol: 177  (06/05/2009)   LDL: 89  (06/05/2009)   LDL Direct: Not documented   HDL: 42  (06/05/2009)   Triglycerides: 230  (06/05/2009)   Lipid panel due: 05/24/2008    SGOT (AST): 17  (06/05/2009)   SGPT (ALT): 15  (06/05/2009) CMP ordered    Alkaline phosphatase: 87  (06/05/2009)   Total bilirubin: 0.3  (06/05/2009)  Hypertension   Last Blood Pressure: 113 / 71  (12/18/2009)   Serum creatinine: 1.74  (06/05/2009)   Serum potassium 4.8  (06/05/2009) CMP ordered   Self-Management Support :   Personal Goals (by the next clinic visit) :     Personal A1C goal: 7  (11/25/2009)     Personal blood pressure goal: 120/80  (11/25/2009)     Personal LDL goal: 70  (11/25/2009)    Patient will work on the following items until the next clinic visit to reach self-care goals:     Medications and monitoring: take my medicines every day, check my blood  sugar, bring all of my medications to every visit, weigh myself weekly, examine my feet every day  (12/18/2009)     Eating: eat more vegetables, eat foods that are low in salt, eat baked foods instead of fried foods, eat fruit for snacks and desserts  (12/18/2009)     Activity: take a 30 minute walk every day  (12/18/2009)     Other: try not to eat processed meats  (04/29/2009)    Diabetes self-management support: Copy of home glucose meter record, CBG self-monitoring log, Written self-care plan, Education handout, Pre-printed educational material  (12/18/2009)   Diabetes care plan printed   Diabetes education handout printed   Last diabetes self-management training by diabetes educator: 06/05/2007   Last medical nutrition therapy: 06/05/2007    Hypertension self-management support: Pre-printed educational material  (12/18/2009)    Lipid self-management support: Education handout, Pre-printed educational material  (12/18/2009)     Lipid education handout printed  Process Orders Check Orders Results:     [facility]: Check successful Tests Sent for requisitioning (December 19, 2009 10:04 AM):     12/18/2009: [facility] -- T-C-Reactive Protein [[id number]] (signed)     12/18/2009: [facility] -- T-CBC w/Diff [[id number]] (signed)     12/18/2009: [facility] -- T-Comprehensive Metabolic Panel [[id number]] (signed)     12/18/2009: [facility] -- T-Sed Rate (Automated) [[id number]] (signed)

## 2010-11-24 NOTE — Progress Notes (Signed)
Summary: phone/gg  Phone Note Call from Patient   Caller: Patient Summary of Call: Pt called asking for supplies for her Tens Unit.  She needs alcohol swabs, adhesive cleaner, electro pads Fax # [phone] - [employer] Initial call taken by: Merrie Roof RN,  August 14, 2010 11:50 AM  Follow-up for Phone Call        ok. script for supplies entered. Follow-up by: [name]  DO,  August 14, 2010 1:17 PM  Additional Follow-up for Phone Call Additional follow up Details #1::        Rx faxed Additional Follow-up by: Merrie Roof RN,  August 14, 2010 5:37 PM    New/Updated Medications: * TENS UNIT ALCOHOL SWABS, ADHESIVE CLEANER, ELECTRO PADS Supplies as needed. DX: Muscle Spasm, Osteoarthritis Prescriptions: TENS UNIT ALCOHOL SWABS, ADHESIVE CLEANER, ELECTRO PADS Supplies as needed. DX: Muscle Spasm, Osteoarthritis  #stock x prn   Entered by:   Merrie Roof RN   Authorized by:   Blanch Media MD   Signed by:   Merrie Roof RN on 08/14/2010   Method used:   Print then Give to Patient   RxID:   [id number] TENS UNIT ALCOHOL SWABS, ADHESIVE CLEANER, ELECTRO PADS Supplies as needed. DX: Muscle Spasm, Osteoarthritis  #stock x prn   Entered and Authorized by:   [name]  DO   Signed by:   [name]  DO on 08/14/2010   Method used:   Print then Give to Patient   RxID:   [id number]

## 2010-11-24 NOTE — Assessment & Plan Note (Signed)
Summary: diabetes/dmr  CBG Result 226 CBG Device ID TRue Balance Comments had eaten bag of chips for lunch right before visit   Allergies: 1)  ! Penicillin 2)  ! Sulfa 3)  ! Tagamet Hb (Cimetidine)   Complete Medication List: 1)  Wellbutrin Xl 300 Mg Xr24h-tab (Bupropion hcl) .... Take 1 tablet by mouth once a day per dr. Marney Setting 2)  Celexa 40 Mg Tabs (Citalopram hydrobromide) .... Take 1 tablet by mouth once a day 3)  Trazodone Hcl 100 Mg Tabs (Trazodone hcl) .... Take 1 tablet by mouth once a day at bedtime for sleep 4)  Novolog Mix 70/30 Flexpen 70-30 % Susp (Insulin aspart prot & aspart) .... Inject 37 units subq in am 15 minutes before breakfast and 26 units in pm 15 minutes before evening meal 5)  Dexilant 60 Mg Cpdr (Dexlansoprazole) .... Take 1 tablet by mouth once a day 6)  Truetrack Test Strp (Glucose blood) .... To test blood sugar 3 times per day 7)  Colace 100 Mg Caps (Docusate sodium) .... Take 1 tablet by mouth two times a day 8)  Lancing Device Misc (Lancet devices) .... Truetrac lancet device, lancet, test strips- testing - (2-3 times daily) 9)  Simvastatin 20 Mg Tabs (Simvastatin) .Marland Kitchen.. 1 by mouth at bedtime 10)  Neurontin 800 Mg Tabs (Gabapentin) .... Take 1 tablet in am and 2 tablets in pm by mouth each day 11)  Hyzaar 100-25 Mg Tabs (Losartan potassium-hctz) .... Take 1 tablet by mouth once a day 12)  Amlodipine Besylate 5 Mg Tabs (Amlodipine besylate) .... Take 1 tablet by mouth once a day 13)  Aspirin 81 Mg Tbec (Aspirin) .... Take one tablet by mouth daily 14)  Isosorbide Mononitrate Cr 60 Mg Xr24h-tab (Isosorbide mononitrate) .... Take 1 tablet by mouth once a day 15)  Toprol Xl 25 Mg Xr24h-tab (Metoprolol succinate) .... Take 1/2 tablet by mouth once a day 16)  Diazepam 5 Mg Tabs (Diazepam) .... Take 1 tablet by mouth every 12 hours as needed for muscle spasm and anxiety 17)  Percocet 10-325 Mg Tabs (Oxycodone-acetaminophen) .... Take 1 tablet twice daily as  needed for pain 18)  Misoprostol 200 Mcg Tabs (Misoprostol) .Marland Kitchen.. 1 tab three times a day 19)  Metformin Hcl 1000 Mg Tabs (Metformin hcl) .Marland Kitchen.. 1 tab two times a day 20)  Tens Unit Alcohol Swabs, Adhesive Cleaner, Electro Pads  .... Supplies as needed. dx: muscle spasm, osteoarthritis 21)  Bd Pen Needle Mini U/f 31g X 5 Mm Misc (Insulin pen needle) .... Inject twice daily as directed  Other Orders: DSMT(Medicare) Individual, 30 Minutes (G0108)   Orders Added: 1)  DSMT(Medicare) Individual, 30 Minutes [G0108]      Diabetes Self Management Training  PCP: [name]  DO Referring MD: Charlton Haws Date diagnosed with diabetes: 10/26/1997 Diabetes Type: Type 2 Diabetes Mellitus Other persons present: no Current smoking Status: quit  Diabetes Medications:  Comments: does not usually carry meter or treatment for low blood sugar. Encouraged this today. was taking her Humulin 70/30 insulin before breakfast, metformin at lunch and dinner and her PM dose of insulin at bedtime. Edcauted patient today on action and storage of new insulin and how to use pen. Patient desires minipen needles and was eduated to take ne winsulin 15 minutes before meals. Discussed need to carry meter nad treatemtn for lows with change in her insulin regimen. CBG was 226 today in office postlunch and MRI.  Mixed/Intermediate   Insulin Type:Novolog Mix 70/30 Breakfast  Dose: 37  Dinner Dose:26    Monitoring Self monitoring blood glucose 2 times a day Name of Meter  TRue Balance Measures urine ketones? No   Wears Medical I.D. No Carrys Food for Low Blood sugar No Can you tell if your blood sugar is low? Yes   Estimated /Usual Carb Intake Breakfast # of Carbs/Grams usually eats 3 meals a day Diabetes Disease Process  Discussed today  Medications State name-action-dose-duration-side effects-and time to take medication: Needs review/assistance   State appropriate timing of food related to medication:  Needs review/assistance   Demonstrates/verbalizes site selection and rotation for injections Needs review/assistance   Correctly draw up and administer insulin-[place]-Symlin-glucagon: Needs review/assistance    Nutritional Management  Monitoring Perform glucose monitoring/ketone testing and record results correctly: Demonstrates competency Complications State the causes- signs and symptoms and prevention of hypoglycemia: Demonstrates competency   Explain proper treatment of hypoglycemia: Needs review/assistance    Exercise Diabetes Management Education Done: 08/17/2010    BEHAVIORAL GOALS INITIAL Utilizing medications if for therapeutic effectiveness: inject insulin before meals Preventing,detecting and treating complications: carry candy or something to treat low blood sugars with her at all times        Drinks regular soda, kool aid. No appetite lately-only wanting to drink. Many symptoms she reported consistent with depression. Diabetes Self Management Support: clinic staff Follow-up:as needed   Appended Document: diabetes/dmr Patient reported forgetting to take metformin doses periodically. Counseled her that she could take the metfomrin at the meals that she also takes her insulin and be prepared for low blood sugars at all times.

## 2010-11-24 NOTE — Miscellaneous (Signed)
Summary: [facility]  Clinical Lists Changes  Observations: Added new observation of CARDCATHFIND: RESULTS:  Hemodynamics:  LV 158/80, AO 154/77.  Coronaries:  Left main had distal 25% stenosis.  The the LAD had long proximal to mid 25% stenosis.  First diagonal was moderate sized with ostial 25% stenosis. Second diagonal was moderate sized with ostial 60% stenosis.  The circumflex had moderate calcification.  There was a long mid 25% stenosis.  There was a long 40-50% stenosis after the mid obtuse marginal.  Mid obtuse marginal was a large vessel with proximal 25% stenosis.  The posterolateral was large with mid 25% stenosis.  The right coronary artery was a large dominant vessel.  There was long proximal to mid 50% stenosis.  There was distal 60-70% stenosis.  PDA was moderate sized and normal.  Posterolateral was small and normal. Left ventriculogram was obtained in the RAO projection.  The EF was 65% with normal wall motion.   CONCLUSION:  Nonobstructive coronary artery disease.   PLAN:  The patient will have medical management.  I will reviewed the films with Dr. Eden Emms.  If she has continued discomfort, we could consider further imaging such as IVUS or FloWire to the distal RCA versus a functional study.  At this point, there has been no objective evidence of ischemia.     (11/18/2009 12:10)      Cardiac Cath  Procedure date:  11/18/2009  Findings:      RESULTS:  Hemodynamics:  LV 158/80, AO 154/77.  Coronaries:  Left main had distal 25% stenosis.  The the LAD had long proximal to mid 25% stenosis.  First diagonal was moderate sized with ostial 25% stenosis. Second diagonal was moderate sized with ostial 60% stenosis.  The circumflex had moderate calcification.  There was a long mid 25% stenosis.  There was a long 40-50% stenosis after the mid obtuse marginal.  Mid obtuse marginal was a large vessel with proximal 25% stenosis.  The posterolateral was large  with mid 25% stenosis.  The right coronary artery was a large dominant vessel.  There was long proximal to mid 50% stenosis.  There was distal 60-70% stenosis.  PDA was moderate sized and normal.  Posterolateral was small and normal. Left ventriculogram was obtained in the RAO projection.  The EF was 65% with normal wall motion.   CONCLUSION:  Nonobstructive coronary artery disease.   PLAN:  The patient will have medical management.  I will reviewed the films with Dr. Eden Emms.  If she has continued discomfort, we could consider further imaging such as IVUS or FloWire to the distal RCA versus a functional study.  At this point, there has been no objective evidence of ischemia.

## 2010-11-24 NOTE — Assessment & Plan Note (Signed)
Summary: per check out/sf   Visit Type:  rov Primary Provider:  [name]  DO  CC:  chest pain...denies any sob or edema.  History of Present Illness: This is a 60 year old, African American female, patient, who is admitted to the hospital with recurrent chest pain.  She was admitted by the fellow and seen/cathed by Dr Antoine Poche.  I have never seen her.  Cardiac catheterization November 18, 2009 revealed nonobstructive coronary disease with 60%. The second diagonal, where to 50% circumflex, 50%, RCA, 60-70% distal RCA, normal LV function, ejection fraction 65%. It was felt that she continued to have discomfort. They could consider further imaging such as IVUS or flow wire to the distal RCA versus a functional study. At this point. There was no objective evidence of ischemia and medical therapy was recommended. She had a normal EF by echo 3/11 and had GI F/U with Dr Matthias Hughs who continued her on two times a day protonix and a reglan like medicine for poor transit.  He will be doing an endoscopy tomorrow for further w/u.  Clincially her pain is clearly not cardiac.  It is daily, positional, diffuse and not related to exertion.  She has a clinical history of depression and sees her psychiatrist every 3 months.  She indicated having pain during our office visit with a totally normal ECG  I reviewed her angiogram done by Dr Antoine Poche.  To me the most concerning area is the proximal RCA which is 60% over a long segment.    She may very well have fibromyalgia or gi etiology but given her DM and diffuse disease I think a functional study to document lack of ischemia is in order.  She is already on good medical Rx and the only med that could be added in the future might be Ranexa  Current Problems (verified): 1)  Coronary Artery Aneurysm  (ICD-414.11) 2)  Hypertension  (ICD-401.9) 3)  Chest Discomfort  (ICD-786.59) 4)  Peripheral Neuropathy  (ICD-356.9) 5)  Cervicalgia  (ICD-723.1) 6)  Low Back Pain  Syndrome  (ICD-724.2) 7)  Hand Pain, Bilateral  (ICD-729.5) 8)  Hyperlipidemia  (ICD-272.4) 9)  Diabetic Peripheral Neuropathy  (ICD-250.60) 10)  Vitamin D Deficiency  (ICD-268.9) 11)  Vitamin B12 Deficiency  (ICD-266.2) 12)  Iliotibial Band Syndrome, Bilateral  (ICD-728.89) 13)  Anemia  (ICD-285.9) 14)  Gerd  (ICD-530.81) 15)  Diabetes Mellitus, Type II  (ICD-250.00) 16)  Depressive Disorder, Rcr, Severe  (ICD-296.33) 17)  Gastroparesis, Diabetic  (ICD-250.60) 18)  Symptom, Apnea, Sleep Nos  (ICD-780.57)  Current Medications (verified): 1)  Wellbutrin Xl 300 Mg Xr24h-Tab (Bupropion Hcl) .... Take 1 Tablet By Mouth Once A Day Per Dr. Marney Setting 2)  Celexa 20 Mg Tabs (Citalopram Hydrobromide) .Marland Kitchen.. 1 By Mouth At Bedtime Per Dr. Marney Setting 3)  Trazodone Hcl 100 Mg Tabs (Trazodone Hcl) .Marland Kitchen.. 1-2 Tab At Bedtime 4)  Novolin 70/30 70-30 % Susp (Insulin Isophane & Regular) .... Take 37 Units in Am and 26 Units in Pm As Directed 5)  Dexilant 60 Mg Cpdr (Dexlansoprazole) .Marland Kitchen.. 1 Tab Once Daily 6)  Truetrack Test  Strp (Glucose Blood) .... To Test Blood Sugar 3 Times Per Day 7)  Colace 100 Mg Caps (Docusate Sodium) .... Take 1 Tablet By Mouth Two Times A Day 8)  Drisdol 29562 Unit Caps (Ergocalciferol) .... Take One Tab By Mouth Weekly. 9)  Lancing Device   Misc [employer] Devices) .... Truetrac Lancet Device, Lancet, Test Strips- Testing - (2-3 Times Daily) 10)  Bd  Safetyglide Insulin Syringe 30g X 5/16" 0.5 Ml  Misc (Insulin Syringe-Needle U-100) .... Use As Directed. 11)  Simvastatin 20 Mg Tabs (Simvastatin) .Marland Kitchen.. 1 By Mouth At Bedtime 12)  Neurontin 300 Mg Caps (Gabapentin) .... Take 2 Tabs in Am and Three Tabs in Pm 13)  Hyzaar 100-25 Mg Tabs (Losartan Potassium-Hctz) .... Take 1 Tablet By Mouth Once A Day 14)  Amlodipine Besylate 5 Mg Tabs (Amlodipine Besylate) .... Take 1 Tablet By Mouth Once A Day 15)  Aspirin 81 Mg Tbec (Aspirin) .... Take One Tablet By Mouth Daily 16)  Isosorbide Mononitrate Cr 60  Mg Xr24h-Tab (Isosorbide Mononitrate) .... Take 1 Tablet By Mouth Once A Day 17)  Toprol Xl 25 Mg Xr24h-Tab (Metoprolol Succinate) .... Take 1/2 Tablet By Mouth Once A Day 18)  Diazepam 5 Mg Tabs (Diazepam) .... Take 1 Tablet By Mouth Every 12 Hours As Needed For Muscle Spasm and Anxiety 19)  Percocet 10-325 Mg Tabs (Oxycodone-Acetaminophen) .... Take 1 Tablet Twice Daily As Needed For Pain 20)  Misoprostol 200 Mcg Tabs (Misoprostol) .Marland Kitchen.. 1 Tab Three Times A Day 21)  Metformin Hcl 1000 Mg Tabs (Metformin Hcl) .Marland Kitchen.. 1 Tab Two Times A Day 22)  Vitamin D (Ergocalciferol) 50000 Unit Caps (Ergocalciferol) .Marland Kitchen.. 1 Tab Weekly  Allergies: 1)  ! Penicillin 2)  ! Sulfa 3)  ! Tagamet Hb (Cimetidine)  Past History:  Past Medical History: Last updated: 11/10/2009 Current Problems:  HYPERTENSION (ICD-401.9) CHEST PAIN (ICD-786.50) CHEST DISCOMFORT (ICD-786.59) PERIPHERAL NEUROPATHY (ICD-356.9) ALLERGY (ICD-995.3) CERVICALGIA (ICD-723.1) CERVICALGIA (ICD-723.1) LOW BACK PAIN SYNDROME (ICD-724.2) HAND PAIN, BILATERAL (ICD-729.5) OTHER ACUTE SINUSITIS (ICD-461.8) HYPERLIPIDEMIA (ICD-272.4) (Chol:  227 LDL: 142  HDL: 43 Tri:     [11/26/2008] ) DIABETIC PERIPHERAL NEUROPATHY (ICD-250.60) LEG PAIN, BILATERAL (ICD-729.5) VITAMIN D DEFICIENCY (ICD-268.9) VITAMIN B12 DEFICIENCY (ICD-266.2) ILIOTIBIAL BAND SYNDROME, BILATERAL (ICD-728.89) ABDOMINAL PAIN (ICD-789.00) ASTHENIA (ICD-780.79) ANEMIA (ICD-285.9) NECK PAIN, ACUTE (ICD-723.1) 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 No ETOH No Tobacco (quit 2004; smoked 30 years 1/2ppd ==> 15pack year hx) No Drugs   Review of Systems       Denies fever, malais, weight loss, blurry vision, decreased visual acuity, cough, sputum, SOB, hemoptysis, pleuritic pain, palpitaitons, heartburn, abdominal pain, melena, lower extremity edema, claudication, or rash.   Vital Signs:  Patient profile:   60 year old female Height:      65 inches Weight:      193 pounds BMI:     32.23 Pulse rate:   74 / minute Pulse rhythm:   regular BP sitting:   118 / 70  (left arm) Cuff size:   large  Vitals Entered By: Danielle Rankin, CMA (January 29, 2010 2:18 PM)  Physical Exam  General:  Affect appropriate Healthy:  appears stated age HEENT: normal Neck supple with no adenopathy JVP normal no bruits no thyromegaly Lungs clear with no wheezing and good diaphragmatic motion Heart:  S1/S2 no murmur,rub, gallop or click PMI normal Abdomen: benighn, BS positve, no tenderness, no AAA no bruit.  No HSM or HJR Distal pulses intact with no bruits No edema Neuro non-focal  Skin warm and dry    Impression & Recommendations:  Problem # 1:  CORONARY ARTERY ANEURYSM (ICD-414.11) Moderate by recent cath with atypical but persistant symptoms  Continue medical Rx with long acting nitrates Myovue  Problem # 2:  HYPERTENSION (ICD-401.9) Well controlled Her updated medication list for this problem includes:    Hyzaar 100-25 Mg Tabs (Losartan potassium-hctz) .Marland Kitchen... Take 1 tablet by mouth once a day    Amlodipine Besylate 5 Mg Tabs (Amlodipine besylate) .Marland Kitchen... Take 1 tablet by mouth once a day    Aspirin 81 Mg Tbec (Aspirin) .Marland Kitchen... Take one tablet by mouth daily    Toprol Xl 25 Mg Xr24h-tab (Metoprolol succinate) .Marland Kitchen... Take 1/2 tablet by mouth once a day  BP today: 118/70 Prior BP: 113/71 (12/18/2009)  Labs Reviewed: K+: 4.4 (12/18/2009) Creat: : 1.60  (12/18/2009)   Chol: 177 (06/05/2009)   HDL: 42 (06/05/2009)   LDL: 89 (06/05/2009)   TG: 230 (06/05/2009)  Problem # 3:  HYPERLIPIDEMIA (ICD-272.4) Well controlled Her updated medication list for this problem includes:    Simvastatin 20 Mg Tabs (Simvastatin) .Marland Kitchen... 1 by mouth at bedtime  CHOL: 177 (06/05/2009)   LDL: 89 (06/05/2009)   HDL: 42 (06/05/2009)   TG: 230 (06/05/2009)  Problem # 4:  GERD (ICD-530.81) A lot of gi symptoms with history of esoph. ring/  Continue meds.  Sucralafate D/C for endoscopy with Buccini tomorrow Her updated medication list for this problem includes:    Dexilant 60 Mg Cpdr (Dexlansoprazole) .Marland Kitchen... 1 tab once daily    Misoprostol 200 Mcg Tabs (Misoprostol) .Marland Kitchen... 1 tab three times a day  Other Orders: Nuclear Stress Test (Nuc Stress Test)  Patient Instructions: 1)  Your physician recommends that you schedule a follow-up appointment in: 4 WEEKS WITH HOCHREIN 2)  Your physician has requested that you have an LEXISCAN stress myoview.  For further information please visit [email or web address]  Please follow instruction sheet, as given.   EKG Report  Procedure date:  01/29/2010  Findings:      NSR 74 Normal ECG

## 2010-11-24 NOTE — Miscellaneous (Signed)
  Clinical Lists Changes  Orders: Added new Test order of MRI without Contrast (MRI w/o Contrast) - Signed      Problem # 13:  LOW BACK PAIN SYNDROME (ICD-724.2) MRI with contrast ordered by Dr. Phillips Odor along with BMP Cr 1.64 and GFR calculates in the 30s, therefore will reorder MRI without contrast.....based on info currently available, no suggestion of infection or malignancy that would benefit from contrast.  Her updated medication list for this problem includes:    Aspirin 81 Mg Tbec (Aspirin) .Marland Kitchen... Take one tablet by mouth daily    Percocet 10-325 Mg Tabs (Oxycodone-acetaminophen) .Marland Kitchen... Take 1 tablet twice daily as needed for pain  Orders: MRI without Contrast (MRI w/o Contrast)   Complete Medication List: 1)  Wellbutrin Xl 300 Mg Xr24h-tab (Bupropion hcl) .... Take 1 tablet by mouth once a day per dr. Marney Setting 2)  Celexa 40 Mg Tabs (Citalopram hydrobromide) .... Take 1 tablet by mouth once a day 3)  Trazodone Hcl 100 Mg Tabs (Trazodone hcl) .... Take 1 tablet by mouth once a day at bedtime for sleep 4)  Novolog Mix 70/30 Flexpen 70-30 % Susp (Insulin aspart prot & aspart) .... Inject 37 units subq in am and 26 units in pm as directed 5)  Dexilant 60 Mg Cpdr (Dexlansoprazole) .... Take 1 tablet by mouth once a day 6)  Truetrack Test Strp (Glucose blood) .... To test blood sugar 3 times per day 7)  Colace 100 Mg Caps (Docusate sodium) .... Take 1 tablet by mouth two times a day 8)  Lancing Device Misc (Lancet devices) .... Truetrac lancet device, lancet, test strips- testing - (2-3 times daily) 9)  Bd Pen Needle Short U/f 31g X 8 Mm Misc (Insulin pen needle) .... Inject twice daily as directed 10)  Simvastatin 20 Mg Tabs (Simvastatin) .Marland Kitchen.. 1 by mouth at bedtime 11)  Neurontin 800 Mg Tabs (Gabapentin) .... Take 1 tablet in am and 2 tablets in pm by mouth each day 12)  Hyzaar 100-25 Mg Tabs (Losartan potassium-hctz) .... Take 1 tablet by mouth once a day 13)  Amlodipine Besylate 5  Mg Tabs (Amlodipine besylate) .... Take 1 tablet by mouth once a day 14)  Aspirin 81 Mg Tbec (Aspirin) .... Take one tablet by mouth daily 15)  Isosorbide Mononitrate Cr 60 Mg Xr24h-tab (Isosorbide mononitrate) .... Take 1 tablet by mouth once a day 16)  Toprol Xl 25 Mg Xr24h-tab (Metoprolol succinate) .... Take 1/2 tablet by mouth once a day 17)  Diazepam 5 Mg Tabs (Diazepam) .... Take 1 tablet by mouth every 12 hours as needed for muscle spasm and anxiety 18)  Percocet 10-325 Mg Tabs (Oxycodone-acetaminophen) .... Take 1 tablet twice daily as needed for pain 19)  Misoprostol 200 Mcg Tabs (Misoprostol) .Marland Kitchen.. 1 tab three times a day 20)  Metformin Hcl 1000 Mg Tabs (Metformin hcl) .Marland Kitchen.. 1 tab two times a day 21)  Tens Unit Alcohol Swabs, Adhesive Cleaner, Electro Pads  .... Supplies as needed. dx: muscle spasm, osteoarthritis

## 2010-11-24 NOTE — Progress Notes (Signed)
Summary: Refill/gh  Phone Note Refill Request Message from:  Fax from Pharmacy on December 26, 2009 1:53 PM  Refills Requested: Medication #1:  SIMVASTATIN 20 MG TABS 1 by mouth at bedtime   Last Refilled: 12/11/2009 Last office visit and labs   Method Requested: Electronic Initial call taken by: Angelina Ok RN,  December 26, 2009 1:53 PM    Prescriptions: SIMVASTATIN 20 MG TABS (SIMVASTATIN) 1 by mouth at bedtime  #90 x 3   Entered and Authorized by:   [name]  DO   Signed by:   [name]  DO on 12/31/2009   Method used:   Electronically to        [facility] [phone]* (retail)       [address]       [place], [place]  [address]       Ph: [phone] or [phone]       Fax: [phone]   RxID:   [id number]

## 2010-11-24 NOTE — Progress Notes (Signed)
Summary: med refill/gp  Phone Note Refill Request Message from:  Patient on May 21, 2010 10:39 AM  Refills Requested: Medication #1:  TRUETRACK TEST  STRP To test blood sugar 3 times per day  Medication #2:  LANCING DEVICE   MISC TrueTrac Lancet Device  Medication #3:  test strips- Testing - (2-3 times daily) She forgot to tell you this am at her appt.    Method Requested: Electronic Initial call taken by: Chinita Pester RN,  May 21, 2010 10:40 AM  Follow-up for Phone Call        Will put in request. Follow-up by: [name]  DO,  May 21, 2010 11:22 PM    Prescriptions: LANCING DEVICE   MISC (LANCET DEVICES) TrueTrac Lancet Device, Lancet, test strips- Testing - (2-3 times daily)  #1 month x 11   Entered and Authorized by:   [name]  DO   Signed by:   [name]  DO on 05/21/2010   Method used:   Electronically to        [facility] [phone]* (retail)       [address], [place]  [address]       Ph: [phone] or [phone]       Fax: [phone]   RxID:   [id number] TRUETRACK TEST  STRP (GLUCOSE BLOOD) To test blood sugar 3 times per day  #1 month x 11   Entered and Authorized by:   [name]  DO   Signed by:   [name]  DO on 05/21/2010   Method used:   [facility]ronically to        [facility] [phone]* (retail)       [address]       [place], [place]  [address]       Ph: [phone] or [phone]       Fax: [phone]   RxID:   [id number]

## 2010-11-24 NOTE — Assessment & Plan Note (Signed)
Summary: ER visit: 11/29/2009 - chest pain  Patient presented to ED for CT of the chest as recommended per PCP in [facility]. Pt continues to experience same type of chest pain as before and she was recently evaluated for the problem. Cardiac cath done 11/18/2009 by Dr. Eden Emms and it showed clean coronory arteries (nonobstructive CAD). In addition, the rest of the cardiac work up including CE's, and EKG's were unremarkable.  Today she describes again substernal chest pain that seems to be continuous and not alleviated by any specific factors. She has been taking NSAIDs and percocet 5-325 mg but it has not been helping. She denies specific aggrevating factors as well. Patient denies any associated symptom of N/V, no other abdominal or urinary concerns at the time, denies any recent trauma or sicknesses.   Of note, her creatinine remains elevated 1.7 today on I-stat and was recently 1.4. Review of her chart shows prevoius values of Cr in 05/2009 of 1.7 as well.   CT of the chest was done today in ED and it showed following: trace amount of pericardial fluid with potentially component of pericardial thickening.  There is no evidence of pericardial calcification.  Patient vitals:    T 97.9 F     BP 119/60     HR 69     Sat 100% on RA  PE:    General: NAD   HEENT: moist MM, poor oral care, no signs of pharyngeal erythema; no postnasal drip   CVS: RRR, no murmurs, rubs or gallops, no JVD   Lungs: CTAB, no wheezing, ttnderness to palpation (mid sternal area), reporducible   Abd: NT/ND, BS +   EXT: no cyanosis, no edema   Neuro: nonfocal  PLAN:  1) Chest pain - recently evaluated and most likely muskuloskeletal in etiology, unsure if related to GI but certainly possible. Discussed the result of CT with radiologist and pericardial fluid shown with potential thickening is not worrisome but can be followed up in future if patient continues to remain symptomatic. Pt is not febrile and has no symptoms of  pericarditis. EKG done today shows NSR at 58 bpm with normal PR and QT intervals, no ST/T segment elevation, rather good R wave progression which is actually much better compared to her EKS in Jan 2011. Pt has appointment with her cardiologist on Thursday this coming week and will follow up with her GI doc Feb 22. I will also send note to [facility] triage nurse and they can call patient early next week to see if symptoms have resolved, I am not sure if she needs to be seen for that until she sees her cardiologist next week. I will also increase her dose of percocet and wiil only give 3 day supply. I told her if the syptoms persists she might need further eval.

## 2010-11-24 NOTE — Progress Notes (Signed)
Summary: diabetes support/ new to insulin pen/dmr  ---- Converted from flag ---- ---- 08/12/2010 6:02 PM, Jamison Neighbor RD,CDE wrote: call her about using flexpen per Waynetta Sandy ------------------------------  Phone Note Outgoing Call   Call placed by: Jamison Neighbor RD,CDE,  August 17, 2010 8:55 AM Summary of Call: hasn't gotten flexpen yet- intends to get it today. Would like demonstration of how to use pen- will come today at 2pm to see CDE.

## 2010-11-24 NOTE — Assessment & Plan Note (Signed)
Summary: F/U/EST/VS   Vital Signs:  Patient profile:   60 year old female Height:      65 inches (165.10 cm) Weight:      196.02 pounds (89.10 kg) Temp:     97.2 degrees F (36.22 degrees C) oral Pulse rate:   78 / minute BP sitting:   108 / 58  (right arm)  Vitals Entered By: Angelina Ok RN (February 05, 2010 10:45 AM) CC: Depression Is Patient Diabetic? Yes Did you bring your meter with you today? Yes Pain Assessment Patient in pain? yes     Location: Center of chest Intensity: 5 Type: pressure Onset of pain  Constant Nutritional Status BMI of > 30 = obese CBG Result 79  Have you ever been in a relationship where you felt threatened, hurt or afraid?No   Does patient need assistance? Functional Status Self care Ambulation Normal Comments Needs refills on meds.  Depression. Dr. went up on  her Celexa.   Primary Care Provider:  [name]  DO  CC:  Depression.  History of Present Illness: Raevin comes in for routine follow-up. She has major problems with chest pain for the past several months. 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 She is being managed medically for this. She says her pain is slightly better but still present at a low level daily, can get worse at night and intermittently with activity. GI scoped her again and found no significant stricture and there is obvious no motility disorder. There is no evidence of costochonditis but we cannot fully r/o msk pain.  Depression History:      The patient is having a depressed mood most of the day and has a diminished interest in her usual daily activities.        The patient denies that she feels like life is not worth living, denies that she wishes that she were dead, and denies that she has thought about ending her life.         Preventive  Screening-Counseling & Management  Alcohol-Tobacco     Alcohol drinks/day: no     Smoking Status: quit     Year Quit: 10 years ago  Allergies: 1)  ! Penicillin 2)  ! Sulfa 3)  ! 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 She is being managed medically for this. She says her pain is slightly better but still present at a low level daily, can get worse at night and intermittently with activity. GI scoped her again and found no significant stricture and there is obvious no motility disorder. There is no evidence of costochonditis but we cannot fully r/o msk pain.  I think it is very possibly related to anxiety vs. muscle spasm- she says that the diazepam is most helpful. I will refill her diazepam and percocet today. I had an extensive conversation about teh use of opiates for this pain and that I did not want to continue this long term. For now she will use teh opiate only as needed -usually in eth PM for pain that prevents  her from sleeping.  Problem # 2:  DIABETES MELLITUS, TYPE II (ICD-250.00) Excellent control. Continue curent regimen.  Her updated medication list for this problem includes:    Novolin 70/30 70-30 % Susp (Insulin isophane & regular) .Marland Kitchen... Take 37 units in am and 26 units in pm as directed    Hyzaar 100-25 Mg Tabs (Losartan potassium-hctz) .Marland Kitchen... Take 1 tablet by mouth once a day    Aspirin 81 Mg Tbec (Aspirin) .Marland Kitchen... Take one tablet by mouth daily    Metformin Hcl 1000 Mg Tabs (Metformin hcl) .Marland Kitchen... 1 tab two times a day  Orders: T- Capillary Blood Glucose (16109) T-Hgb A1C (in-house) (60454UJ)  Labs Reviewed: Creat: 1.60 (12/18/2009)     Last Eye Exam: No diabetic retinopathy.    (12/25/2008) Reviewed HgBA1c  results: 6.6 (02/05/2010)  6.4 (10/30/2009)  Problem # 3:  HYPERTENSION (ICD-401.9) Refilled meds today. Her updated medication list for this problem includes:    Hyzaar 100-25 Mg Tabs (Losartan potassium-hctz) .Marland Kitchen... Take 1 tablet by mouth once a day    Amlodipine Besylate 5 Mg Tabs (Amlodipine besylate) .Marland Kitchen... Take 1 tablet by mouth once a day    Toprol Xl 25 Mg Xr24h-tab (Metoprolol succinate) .Marland Kitchen... Take 1/2 tablet by mouth once a day  Complete Medication List: 1)  Wellbutrin Xl 300 Mg Xr24h-tab (Bupropion hcl) .... Take 1 tablet by mouth once a day per dr. Marney Setting 2)  Celexa 20 Mg Tabs (Citalopram hydrobromide) .Marland Kitchen.. 1 by mouth at bedtime per dr. Marney Setting 3)  Trazodone Hcl 100 Mg Tabs (Trazodone hcl) .Marland Kitchen.. 1-2 tab at bedtime 4)  Novolin 70/30 70-30 % Susp (Insulin isophane & regular) .... Take 37 units in am and 26 units in pm as directed 5)  Dexilant 60 Mg Cpdr (Dexlansoprazole) .Marland Kitchen.. 1 tab once daily 6)  Truetrack Test Strp (Glucose blood) .... To test blood sugar 3 times per day 7)  Colace 100 Mg Caps (Docusate sodium) .... Take 1 tablet by mouth two times a day 8)  Drisdol 81191 Unit Caps (Ergocalciferol) .... Take one tab by mouth weekly. 9)  Lancing Device Misc (Lancet devices) .... Truetrac lancet device, lancet, test strips- testing - (2-3 times daily) 10)  Bd Safetyglide Insulin Syringe 30g X 5/16" 0.5 Ml Misc (Insulin syringe-needle u-100) .... Use as directed. 11)  Simvastatin 20 Mg Tabs (Simvastatin) .Marland Kitchen.. 1 by mouth at bedtime 12)  Neurontin 800 Mg Tabs (Gabapentin) .... Take 1 tablet by mouth two times a day 13)  Hyzaar 100-25 Mg Tabs (Losartan potassium-hctz) .... Take 1 tablet by mouth once a day 14)  Amlodipine Besylate 5 Mg Tabs (Amlodipine besylate) .... Take 1 tablet by mouth once a day 15)  Aspirin 81 Mg Tbec (Aspirin) .... Take one tablet by mouth daily 16)  Isosorbide Mononitrate Cr 60 Mg Xr24h-tab (Isosorbide mononitrate) .... Take 1 tablet by mouth once a day 17)   Toprol Xl 25 Mg Xr24h-tab (Metoprolol succinate) .... Take 1/2 tablet by mouth once a day 18)  Diazepam 5 Mg Tabs (Diazepam) .... Take 1 tablet by mouth every 12 hours as needed for muscle spasm and anxiety 19)  Percocet 10-325 Mg Tabs (Oxycodone-acetaminophen) .... Take 1 tablet twice daily as needed for pain 20)  Misoprostol 200 Mcg Tabs (Misoprostol) .Marland Kitchen.. 1 tab three times a day 21)  Metformin Hcl 1000 Mg Tabs (Metformin hcl) .Marland Kitchen.. 1 tab two times a day 22)  Vitamin D (ergocalciferol) 50000 Unit Caps (Ergocalciferol) .Marland Kitchen.. 1 tab weekly  Patient Instructions: 1)  Please  schedule a follow-up appointment in 2 months. Prescriptions: DIAZEPAM 5 MG TABS (DIAZEPAM) Take 1 tablet by mouth every 12 hours as needed for muscle spasm and anxiety  #30 x 5   Entered and Authorized by:   [name]  DO   Signed by:   [name]  DO on 02/05/2010   Method used:   Print then Give to Patient   RxID:   [id number] PERCOCET 10-325 MG TABS (OXYCODONE-ACETAMINOPHEN) Take 1 tablet twice daily as needed for pain  #60 x 0   Entered and Authorized by:   [name]  DO   Signed by:   [name]  DO on 02/05/2010   Method used:   Print then Give to Patient   RxID:   [id number] METFORMIN HCL 1000 MG TABS (METFORMIN HCL) 1 tab two times a day  #60 x 11   Entered and Authorized by:   [name]  DO   Signed by:   [name]  DO on 02/05/2010   Method used:   Electronically to        [facility] [phone]* (retail)       [address], [place]  [address]       Ph: [phone] or [phone]       Fax: [phone]   RxID:   [id number] AMLODIPINE BESYLATE 5 MG TABS (AMLODIPINE BESYLATE) Take 1 tablet by mouth once a day  #30 x 11   Entered and Authorized by:   [name]  DO   Signed by:   [name]  DO on 02/05/2010   Method used:   Electronically to        [facility] [phone]* (retail)       [address] [place]  [address]       Ph:  [phone] or [phone]       Fax: [phone]   RxID:   [id number] HYZAAR 100-25 MG TABS (LOSARTAN POTASSIUM-HCTZ) Take 1 tablet by mouth once a day  #30 x 11   Entered and Authorized by:   [name]  DO   Signed by:   [name]  DO on 02/05/2010   Method used:   [facility]ron[facility] to        [facility] [phone]* (retail)       [address], [place]  [address]       Ph: [phone] or [phone]       Fax: [phone]   RxID:   [id number] NEURONTIN 800 MG TABS (GABAPENTIN) Take 1 tablet by mouth two times a day  #60 x 11   Entered and Authorized by:   [name]  DO   Signed by:   [name]  DO on 02/05/2010   Method used:   [facility]ronically to        [facility]. [phone]* (retail)       [address], [place]  [address]       Ph: [phone] or [phone]       Fax: [phone]   RxID:   [id number] SIMVASTATIN 20 MG TABS (SIMVASTATIN) 1 by mouth at bedtime  #90 x 11   Entered and Authorized by:   [name]  DO   Signed by:   [name]  DO on 02/05/2010   Method used:   [facility] to        [facility] [phone]* (retail)       [address] [place]  [address]       Ph: [phone] or [phone]       Fax: [phone]   RxID:   [id number]   Prevention & Chronic Care Immunizations   Influenza vaccine: Fluvax MCR  (10/16/2009)   Influenza vaccine deferral: Deferred  (04/29/2009)   Influenza vaccine due: 06/25/2009    Tetanus booster: Not documented   Td booster deferral: Deferred  (04/29/2009)    Pneumococcal vaccine: Not documented  Colorectal Screening   Hemoccult: Not documented   Hemoccult action/deferral: Deferred  (04/29/2009)    Colonoscopy: Not documented   Colonoscopy action/deferral: Deferred  (04/29/2009)  Other Screening   Pap smear: Not documented   Pap smear action/deferral: Deferred-3 yr interval  (11/25/2009)    Mammogram: No specific mammographic evidence of  malignancy.    (09/22/2009)   Mammogram action/deferral: Ordered  (10/30/2009)   Mammogram due: 03/2007   Smoking status: quit  (02/05/2010)  Diabetes Mellitus   HgbA1C: 6.6  (02/05/2010)   Hemoglobin A1C due: 08/25/2007    Eye exam: No diabetic retinopathy.     (12/25/2008)   Eye exam due: 12/2009    Foot exam: yes  (11/26/2008)   High risk foot: Not documented   Foot care education: 11/111/08  (06/05/2007)   Foot exam due: 09/05/2007    Urine microalbumin/creatinine ratio: 148.7  (05/25/2007)   Urine microalbumin action/deferral: Deferred   Urine microalbumin/cr due: 05/24/2008  Lipids   Total Cholesterol: 177  (06/05/2009)   LDL: 89  (06/05/2009)   LDL Direct: Not documented   HDL: 42  (06/05/2009)   Triglycerides: 230  (06/05/2009)   Lipid panel due: 05/24/2008    SGOT (AST): 11  (12/18/2009)   SGPT (ALT): 9  (12/18/2009)   Alkaline phosphatase: 65  (12/18/2009)   Total bilirubin: 0.3  (12/18/2009)  Hypertension   Last Blood Pressure: 108 / 58  (02/05/2010)   Serum creatinine: 1.60  (12/18/2009)   Serum potassium 4.4  (12/18/2009)  Self-Management Support :   Personal Goals (by the next clinic visit) :     Personal A1C goal: 7  (11/25/2009)     Personal blood pressure goal: 120/80  (11/25/2009)     Personal LDL goal: 70  (11/25/2009)    Patient will work on the following items until the next clinic visit to reach self-care goals:     Medications and monitoring: take my medicines every day, check my blood sugar, bring all of my medications to every visit, examine my feet every day  (02/05/2010)     Eating: drink diet soda or water instead of juice or soda, eat more vegetables, eat baked foods instead of fried foods, eat fruit for snacks and desserts, limit or avoid alcohol  (02/05/2010)     Activity: take a 30 minute walk every day  (02/05/2010)     Other: try not to eat processed meats  (04/29/2009)    Diabetes self-management support: CBG self-monitoring  log, Written self-care plan, Education handout, Resources for patients handout  (02/05/2010)   Diabetes care plan printed   Diabetes education handout printed   Last diabetes self-management training by diabetes educator: 06/05/2007   Last medical nutrition therapy: 06/05/2007    Hypertension self-management support: BP self-monitoring log, Written self-care plan,  Education handout, Resources for patients handout  (02/05/2010)   Hypertension self-care plan printed.   Hypertension education handout printed    Lipid self-management support: Written self-care plan, Education handout, Pre-printed educational material, Resources for patients handout  (02/05/2010)   Lipid self-care plan printed.   Lipid education handout printed      Resource handout printed.                                   Vital Signs:  Patient profile:   60 year old female Height:      65 inches (165.10 cm) Weight:      196.02 pounds (89.10 kg) Temp:     97.2 degrees F (36.22 degrees C) oral Pulse rate:   78 / minute BP sitting:   108 / 58  (right arm)  Vitals Entered By: Angelina Ok RN (February 05, 2010 10:45 AM)   Laboratory Results   Blood Tests   Date/Time Received: February 05, 2010 11:39 AM Date/Time Reported: Alric Quan  February 05, 2010 11:39 AM  HGBA1C: 6.6%   (Normal Range: Non-Diabetic - 3-6%   Control Diabetic - 6-8%) CBG Random:: 79mg /dL
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
## 2010-11-24 NOTE — Progress Notes (Signed)
Summary: med refill/gp  Phone Note Refill Request Message from:  Fax from Pharmacy on December 01, 2009 9:23 AM  Refills Requested: Medication #1:  NOVOLIN 70/30 70-30 % SUSP Take 37 units in AM and 26 units in PM as directed   Last Refilled: 11/02/2009 Last appt. 11/25/09;  scheduled with Dr. Phillips Odor 12/18/09.   Method Requested: Electronic Initial call taken by: Chinita Pester RN,  December 01, 2009 9:23 AM  Follow-up for Phone Call        Refilled electronically.  Follow-up by: Margarito Liner MD,  December 02, 2009 12:55 PM    Prescriptions: NOVOLIN 70/30 70-30 % SUSP (INSULIN ISOPHANE & REGULAR) Take 37 units in AM and 26 units in PM as directed  #20 mL x 3   Entered and Authorized by:   Margarito Liner MD   Signed by:   Margarito Liner MD on 12/02/2009   Method used:   Electronically to        [facility]. [phone]* (retail)       [address]       [place], [place]  [address]       Ph: [phone] or [phone]       Fax: [phone]   RxID:   [id number]

## 2010-11-24 NOTE — Assessment & Plan Note (Signed)
Summary: est-ck/fu/meds/cfb   Vital Signs:  Patient profile:   60 year old female Height:      65 inches (165.10 cm) Weight:      192.06 pounds (87.30 kg) BMI:     32.08 Temp:     98.4 degrees F (36.89 degrees C) oral Pulse rate:   77 / minute BP sitting:   118 / 72  (right arm) Cuff size:   large  Vitals Entered By: Angelina Ok RN (May 21, 2010 9:06 AM) CC: Depression Is Patient Diabetic? Yes Did you bring your meter with you today? No Pain Assessment Patient in pain? no      Nutritional Status BMI of > 30 = obese CBG Result 134  Have you ever been in a relationship where you felt threatened, hurt or afraid?No   Does patient need assistance? Functional Status Self care Ambulation Normal Comments 3 month check up.  Dental work done in May.  Still having problems eating meat, vegatables.  Needs refills on Insulin Syringes.   Primary Care Provider:  [name]  DO  CC:  Depression.  History of Present Illness: Chest pain resolved completely. Pain in bottom of feet-sharp and lancing worse in right vs. Left.  Right thigh pain. Back pain lower back- interferes with daily activities such as standing to cook dinner and was dishes.   Depression History:      The patient denies a depressed mood most of the day and a diminished interest in her usual daily activities.        Comments:  On medications for.   Preventive Screening-Counseling & Management  Alcohol-Tobacco     Alcohol drinks/day: no     Smoking Status: quit     Year Quit: 10 years ago  Allergies: 1)  ! Penicillin 2)  ! Sulfa 3)  ! Tagamet Hb (Cimetidine)   Impression & Recommendations:  Problem # 1:  LOW BACK PAIN SYNDROME (ICD-724.2) No red flag symptoms. Likely dengenerative disc disease. I recommended gentle daily str4etch and strengthening in addition to formal physical therapy. I will continue her percocet as needed- she has several left from a prior script. Will be ffective for her acute back  pain. Her updated medication list for this problem includes:    Aspirin 81 Mg Tbec (Aspirin) .Marland Kitchen... Take one tablet by mouth daily    Percocet 10-325 Mg Tabs (Oxycodone-acetaminophen) .Marland Kitchen... Take 1 tablet twice daily as needed for pain  Problem # 2:  CHEST DISCOMFORT (ICD-786.59) Completely resolved. Probably secondary to anxiety. She thinks diazapam was the most helpful for resolving the chest pain.   Problem # 3:  DIABETES MELLITUS, TYPE II (ICD-250.00) Excellent control. No changes today. Her updated medication list for this problem includes:    Novolin 70/30 70-30 % Susp (Insulin isophane & regular) .Marland Kitchen... Take 37 units in am and 26 units in pm as directed    Hyzaar 100-25 Mg Tabs (Losartan potassium-hctz) .Marland Kitchen... Take 1 tablet by mouth once a day    Aspirin 81 Mg Tbec (Aspirin) .Marland Kitchen... Take one tablet by mouth daily    Metformin Hcl 1000 Mg Tabs (Metformin hcl) .Marland Kitchen... 1 tab two times a day  Orders: T- Capillary Blood Glucose (82948) T-Hgb A1C (in-house) (38101BP)  Complete Medication List: 1)  Wellbutrin Xl 300 Mg Xr24h-tab (Bupropion hcl) .... Take 1 tablet by mouth once a day per dr. Marney Setting 2)  Celexa 40 Mg Tabs (Citalopram hydrobromide) .... Take 1 tablet by mouth once a day 3)  Trazodone  Hcl 100 Mg Tabs (Trazodone hcl) .... Take 1 tablet by mouth once a day at bedtime for sleep 4)  Novolin 70/30 70-30 % Susp (Insulin isophane & regular) .... Take 37 units in am and 26 units in pm as directed 5)  Dexilant 60 Mg Cpdr (Dexlansoprazole) .... Take 1 tablet by mouth once a day 6)  Truetrack Test Strp (Glucose blood) .... To test blood sugar 3 times per day 7)  Colace 100 Mg Caps (Docusate sodium) .... Take 1 tablet by mouth two times a day 8)  Lancing Device Misc (Lancet devices) .... Truetrac lancet device, lancet, test strips- testing - (2-3 times daily) 9)  Bd Safetyglide Insulin Syringe 30g X 5/16" 0.5 Ml Misc (Insulin syringe-needle u-100) .... Use as directed. 10)  Simvastatin 20 Mg  Tabs (Simvastatin) .Marland Kitchen.. 1 by mouth at bedtime 11)  Neurontin 800 Mg Tabs (Gabapentin) .... Take 1 tablet in am and 2 tablets in pm by mouth each day 12)  Hyzaar 100-25 Mg Tabs (Losartan potassium-hctz) .... Take 1 tablet by mouth once a day 13)  Amlodipine Besylate 5 Mg Tabs (Amlodipine besylate) .... Take 1 tablet by mouth once a day 14)  Aspirin 81 Mg Tbec (Aspirin) .... Take one tablet by mouth daily 15)  Isosorbide Mononitrate Cr 60 Mg Xr24h-tab (Isosorbide mononitrate) .... Take 1 tablet by mouth once a day 16)  Toprol Xl 25 Mg Xr24h-tab (Metoprolol succinate) .... Take 1/2 tablet by mouth once a day 17)  Diazepam 5 Mg Tabs (Diazepam) .... Take 1 tablet by mouth every 12 hours as needed for muscle spasm and anxiety 18)  Percocet 10-325 Mg Tabs (Oxycodone-acetaminophen) .... Take 1 tablet twice daily as needed for pain 19)  Misoprostol 200 Mcg Tabs (Misoprostol) .Marland Kitchen.. 1 tab three times a day 20)  Metformin Hcl 1000 Mg Tabs (Metformin hcl) .Marland Kitchen.. 1 tab two times a day  Patient Instructions: 1)  Please schedule a follow-up appointment in 3-4 months. Prescriptions: PERCOCET 10-325 MG TABS (OXYCODONE-ACETAMINOPHEN) Take 1 tablet twice daily as needed for pain  #60 x 0   Entered and Authorized by:   [name]  DO   Signed by:   [name]  DO on 05/21/2010   Method used:   Print then Give to Patient   RxID:   [id number] DIAZEPAM 5 MG TABS (DIAZEPAM) Take 1 tablet by mouth every 12 hours as needed for muscle spasm and anxiety  #30 x 5   Entered and Authorized by:   [name]  DO   Signed by:   [name]  DO on 05/21/2010   Method used:   Print then Give to Patient   RxID:   [id number] NOVOLIN 70/30 70-30 % SUSP (INSULIN ISOPHANE & REGULAR) Take 37 units in AM and 26 units in PM as directed  #1 mo x 11   Entered and Authorized by:   [name]  DO   Signed by:   [name]  DO on 05/21/2010   Method used:   Electronically to        [facility] [phone]*  (retail)       [address] [place]  [address]       Ph: [phone] or [phone]       Fax: [phone]   RxID:   [id number] NEURONTIN 800 MG TABS (GABAPENTIN) Take 1 tablet in AM and 2 tablets in PM by mouth each day  #90 x  6   Entered and Authorized by:   [name]  DO   Signed by:   [name]  DO on 05/21/2010   Method used:   [facility] to        [facility] [phone]* (retail)       [address] [place]  [address]       Ph: [phone] or [phone]       Fax: [phone]   RxID:   [id number]    Vital Signs:  Patient profile:   60 year old female Height:      65 inches (165.10 cm) Weight:      192.06 pounds (87.30 kg) BMI:     32.08 Temp:     98.4 degrees F (36.89 degrees C) oral Pulse rate:   77 / minute BP sitting:   118 / 72  (right arm) Cuff size:   large  Vitals Entered By: Angelina Ok RN (May 21, 2010 9:06 AM)   Prevention & Chronic Care Immunizations   Influenza vaccine: Fluvax MCR  (10/16/2009)   Influenza vaccine deferral: Deferred  (04/29/2009)   Influenza vaccine due: 06/25/2009    Tetanus booster: Not documented   Td booster deferral: Deferred  (04/29/2009)    Pneumococcal vaccine: Not documented  Colorectal Screening   Hemoccult: Not documented   Hemoccult action/deferral: Deferred  (04/29/2009)    Colonoscopy: Not documented   Colonoscopy action/deferral: Deferred  (04/29/2009)  Other Screening   Pap smear: Not documented   Pap smear action/deferral: Deferred-3 yr interval  (11/25/2009)    Mammogram: No specific mammographic evidence of malignancy.    (09/22/2009)   Mammogram action/deferral: Ordered  (10/30/2009)   Mammogram due: 03/2007   Smoking status: quit  (05/21/2010)  Diabetes Mellitus   HgbA1C: 6.3  (05/21/2010)   Hemoglobin A1C due: 08/25/2007    Eye exam: No diabetic retinopathy.     (12/25/2008)   Eye exam due: 12/2009    Foot exam: yes  (11/26/2008)   High  risk foot: Not documented   Foot care education: 11/111/08  (06/05/2007)   Foot exam due: 09/05/2007    Urine microalbumin/creatinine ratio: 148.7  (05/25/2007)   Urine microalbumin action/deferral: Deferred   Urine microalbumin/cr due: 05/24/2008  Lipids   Total Cholesterol: 177  (06/05/2009)   LDL: 89  (06/05/2009)   LDL Direct: Not documented   HDL: 42  (06/05/2009)   Triglycerides: 230  (06/05/2009)   Lipid panel due: 05/24/2008    SGOT (AST): 11  (12/18/2009)   SGPT (ALT): 9  (12/18/2009)   Alkaline phosphatase: 65  (12/18/2009)   Total bilirubin: 0.3  (12/18/2009)  Hypertension   Last Blood Pressure: 118 / 72  (05/21/2010)   Serum creatinine: 1.60  (12/18/2009)   Serum potassium 4.4  (12/18/2009)  Self-Management Support :   Personal Goals (by the next clinic visit) :     Personal A1C goal: 7  (11/25/2009)     Personal blood pressure goal: 120/80  (11/25/2009)     Personal LDL goal: 70  (11/25/2009)    Patient will work on the following items until the next clinic visit to reach self-care goals:     Medications and monitoring: take my medicines every day, bring all of my medications to every visit, examine my feet every day  (05/21/2010)     Eating: drink diet soda or water instead of juice or soda, eat more vegetables, use fresh or frozen  vegetables, eat foods that are low in salt, eat baked foods instead of fried foods, eat fruit for snacks and desserts, limit or avoid alcohol  (05/21/2010)     Activity: take a 30 minute walk every day  (05/21/2010)     Other: try not to eat processed meats  (04/29/2009)    Diabetes self-management support: Written self-care plan, Education handout, Pre-printed educational material, Resources for patients handout  (05/21/2010)   Diabetes care plan printed   Diabetes education handout printed   Last diabetes self-management training by diabetes educator: 06/05/2007   Last medical nutrition therapy: 06/05/2007    Hypertension  self-management support: Written self-care plan, Education handout, Pre-printed educational material, Resources for patients handout  (05/21/2010)   Hypertension self-care plan printed.   Hypertension education handout printed    Lipid self-management support: Written self-care plan, Education handout, Pre-printed educational material, Resources for patients handout  (05/21/2010)   Lipid self-care plan printed.   Lipid education handout printed      Resource handout printed.      Laboratory Results   Blood Tests   Date/Time Received: May 21, 2010 9:27 AM Date/Time Reported: Alric Quan  May 21, 2010 9:27 AM   HGBA1C: 6.3%   (Normal Range: Non-Diabetic - 3-6%   Control Diabetic - 6-8%) CBG Random:: 134mg /dL      Primary Care Provider:  [name]  DO  CC:  Depression.  History of Present Illness: Chest pain resolved completely. Pain in bottom of feet-sharp and lancing worse in right vs. Left.  Right thigh pain. Back pain lower back- interferes with daily activities such as standing to cook dinner and was dishes.   Depression History:      The patient denies a depressed mood most of the day and a diminished interest in her usual daily activities.        Comments:  On medications for.   Impression & Recommendations:  Problem # 1:  LOW BACK PAIN SYNDROME (ICD-724.2) No red flag symptoms. Likely dengenerative disc disease. I recommended gentle daily str4etch and strengthening in addition to formal physical therapy. I will continue her percocet as needed- she has several left from a prior script. Will be ffective for her acute back pain. Her updated medication list for this problem includes:    Aspirin 81 Mg Tbec (Aspirin) .Marland Kitchen... Take one tablet by mouth daily    Percocet 10-325 Mg Tabs (Oxycodone-acetaminophen) .Marland Kitchen... Take 1 tablet twice daily as needed for pain  Problem # 2:  CHEST DISCOMFORT (ICD-786.59) Completely resolved. Probably secondary to anxiety. She  thinks diazapam was the most helpful for resolving the chest pain.   Problem # 3:  DIABETES MELLITUS, TYPE II (ICD-250.00) Excellent control. No changes today. Her updated medication list for this problem includes:    Novolin 70/30 70-30 % Susp (Insulin isophane & regular) .Marland Kitchen... Take 37 units in am and 26 units in pm as directed    Hyzaar 100-25 Mg Tabs (Losartan potassium-hctz) .Marland Kitchen... Take 1 tablet by mouth once a day    Aspirin 81 Mg Tbec (Aspirin) .Marland Kitchen... Take one tablet by mouth daily    Metformin Hcl 1000 Mg Tabs (Metformin hcl) .Marland Kitchen... 1 tab two times a day  Orders: T- Capillary Blood Glucose (82948) T-Hgb A1C (in-house) (16109UE)  Complete Medication List: 1)  Wellbutrin Xl 300 Mg Xr24h-tab (Bupropion hcl) .... Take 1 tablet by mouth once a day per dr. Marney Setting 2)  Celexa 40 Mg Tabs (Citalopram hydrobromide) .... Take 1 tablet by  mouth once a day 3)  Trazodone Hcl 100 Mg Tabs (Trazodone hcl) .... Take 1 tablet by mouth once a day at bedtime for sleep 4)  Novolin 70/30 70-30 % Susp (Insulin isophane & regular) .... Take 37 units in am and 26 units in pm as directed 5)  Dexilant 60 Mg Cpdr (Dexlansoprazole) .... Take 1 tablet by mouth once a day 6)  Truetrack Test Strp (Glucose blood) .... To test blood sugar 3 times per day 7)  Colace 100 Mg Caps (Docusate sodium) .... Take 1 tablet by mouth two times a day 8)  Lancing Device Misc (Lancet devices) .... Truetrac lancet device, lancet, test strips- testing - (2-3 times daily) 9)  Bd Safetyglide Insulin Syringe 30g X 5/16" 0.5 Ml Misc (Insulin syringe-needle u-100) .... Use as directed. 10)  Simvastatin 20 Mg Tabs (Simvastatin) .Marland Kitchen.. 1 by mouth at bedtime 11)  Neurontin 800 Mg Tabs (Gabapentin) .... Take 1 tablet in am and 2 tablets in pm by mouth each day 12)  Hyzaar 100-25 Mg Tabs (Losartan potassium-hctz) .... Take 1 tablet by mouth once a day 13)  Amlodipine Besylate 5 Mg Tabs (Amlodipine besylate) .... Take 1 tablet by mouth once a  day 14)  Aspirin 81 Mg Tbec (Aspirin) .... Take one tablet by mouth daily 15)  Isosorbide Mononitrate Cr 60 Mg Xr24h-tab (Isosorbide mononitrate) .... Take 1 tablet by mouth once a day 16)  Toprol Xl 25 Mg Xr24h-tab (Metoprolol succinate) .... Take 1/2 tablet by mouth once a day 17)  Diazepam 5 Mg Tabs (Diazepam) .... Take 1 tablet by mouth every 12 hours as needed for muscle spasm and anxiety 18)  Percocet 10-325 Mg Tabs (Oxycodone-acetaminophen) .... Take 1 tablet twice daily as needed for pain 19)  Misoprostol 200 Mcg Tabs (Misoprostol) .Marland Kitchen.. 1 tab three times a day 20)  Metformin Hcl 1000 Mg Tabs (Metformin hcl) .Marland Kitchen.. 1 tab two times a day  Prescriptions: PERCOCET 10-325 MG TABS (OXYCODONE-ACETAMINOPHEN) Take 1 tablet twice daily as needed for pain  #60 x 0   Entered and Authorized by:   [name]  DO   Signed by:   [name]  DO on 05/21/2010   Method used:   Print then Give to Patient   RxID:   [id number] DIAZEPAM 5 MG TABS (DIAZEPAM) Take 1 tablet by mouth every 12 hours as needed for muscle spasm and anxiety  #30 x 5   Entered and Authorized by:   [name]  DO   Signed by:   [name]  DO on 05/21/2010   Method used:   Print then Give to Patient   RxID:   [id number] NOVOLIN 70/30 70-30 % SUSP (INSULIN ISOPHANE & REGULAR) Take 37 units in AM and 26 units in PM as directed  #1 mo x 11   Entered and Authorized by:   [name]  DO   Signed by:   [name]  DO on 05/21/2010   Method used:   [facility] to        [facility] [phone]* (retail)       [address] [place]  [address]       Ph: [phone] or [phone]       Fax: [phone]   RxID:   [id number] NEURONTIN 800 MG TABS (GABAPENTIN) Take 1 tablet in AM and 2 tablets in PM by mouth each day  #90 x 6   Entered and Authorized  by:   [name]  DO   Signed by:   [name]  DO on 05/21/2010   Method used:   [facility] to        [facility] [phone]*  (retail)       [address]       [place], [place]  [address]       Ph: [phone] or [phone]       Fax: [phone]   RxID:   [id number]

## 2010-11-26 NOTE — Miscellaneous (Signed)
  Clinical Lists Changes  Problems: Removed problem of NEED PROPHYLACTIC VACCINATION&INOCULATION FLU (ICD-V04.81) 

## 2010-11-26 NOTE — Assessment & Plan Note (Signed)
Summary: ET-4 MONTH F/U VISIT/CH   Vital Signs:  Patient profile:   60 year old female Height:      65 inches (165.10 cm) Weight:      201.06 pounds (91.39 kg) BMI:     33.58 Temp:     98.5 degrees F (36.94 degrees C) oral Pulse rate:   87 / minute BP sitting:   121 / 67  (right arm)  Vitals Entered By: Angelina Ok RN (August 12, 2010 2:22 PM) Is Patient Diabetic? Yes Did you bring your meter with you today? Yes Pain Assessment Patient in pain? yes     Location: BACK, STOMACH, FEET Intensity: 8/5 Type: aching Onset of pain  Constant Nutritional Status BMI of > 30 = obese CBG Result 96  Have you ever been in a relationship where you felt threatened, hurt or afraid?No   Does patient need assistance? Functional Status Self care Ambulation Normal Comments Feet hurt on top and bottom.  Queezy feeling in stomach.  Happens occassionally.   Primary Care Provider:  [name]  DO   History of Present Illness: [name] comes in today for routine follow-up. She continues to have problems with peripheral neuropathy from her DM.She also has intense back pain from degenerative disc disease and muscle spasm. Sleep is an issue related to leg cramping. Stil doing well with DM regimen.  Depression History:      The patient denies a depressed mood most of the day and a diminished interest in her usual daily activities.         Preventive Screening-Counseling & Management  Alcohol-Tobacco     Alcohol drinks/day: no     Smoking Status: quit     Year Quit: 10 years ago  Allergies: 1)  ! Penicillin 2)  ! Sulfa 3)  ! Tagamet Hb (Cimetidine)  Diabetes Management Exam:    Foot Exam (with socks and/or shoes not present):       Sensory-Monofilament:          Left foot: normal          Right foot: normal   Impression & Recommendations:  Problem # 1:  LOW BACK PAIN SYNDROME (ICD-724.2) Pain has been progressive. Now with lancing pain down legs and significant hypertonicity spasm  R>L leg -TTP over right SI joint. Continue current medications.  Her updated medication list for this problem includes:    Aspirin 81 Mg Tbec (Aspirin) .Marland Kitchen... Take one tablet by mouth daily    Percocet 10-325 Mg Tabs (Oxycodone-acetaminophen) .Marland Kitchen... Take 1 tablet twice daily as needed for pain  Problem # 2:  FOOT PAIN, BILATERAL (ICD-729.5) Will have podiatry eval to see if structureal problem present. Orders: Podiatry Referral (Podiatry)  Problem # 3:  DIABETES MELLITUS, TYPE II (ICD-250.00) DOing well no changes today. Greater than 25 minutes spent in face to face discussion with this patient and in review of the medical record. Greater than 50% of this time was spent in education, counseling and providing instruction on complex medication and medical management issues. No episodes of hypoglycemia, meter results reviewed.  Her updated medication list for this problem includes:    Novolog Mix 70/30 Flexpen 70-30 % Susp (Insulin aspart prot & aspart) ..... Inject 37 units subq in am and 26 units in pm as directed    Hyzaar 100-25 Mg Tabs (Losartan potassium-hctz) .Marland Kitchen... Take 1 tablet by mouth once a day    Aspirin 81 Mg Tbec (Aspirin) .Marland Kitchen... Take one tablet by mouth daily  Metformin Hcl 1000 Mg Tabs (Metformin hcl) .Marland Kitchen... 1 tab two times a day  Orders: T- Capillary Blood Glucose (29562) T-Hgb A1C (in-house) (13086VH)  Labs Reviewed: Creat: 1.60 (12/18/2009)     Last Eye Exam: No diabetic retinopathy.    (12/25/2008) Reviewed HgBA1c results: 6.4 (08/12/2010)  6.3 (05/21/2010)  Complete Medication List: 1)  Wellbutrin Xl 300 Mg Xr24h-tab (Bupropion hcl) .... Take 1 tablet by mouth once a day per dr. Marney Setting 2)  Celexa 40 Mg Tabs (Citalopram hydrobromide) .... Take 1 tablet by mouth once a day 3)  Trazodone Hcl 100 Mg Tabs (Trazodone hcl) .... Take 1 tablet by mouth once a day at bedtime for sleep 4)  Novolog Mix 70/30 Flexpen 70-30 % Susp (Insulin aspart prot & aspart) .... Inject 37  units subq in am and 26 units in pm as directed 5)  Dexilant 60 Mg Cpdr (Dexlansoprazole) .... Take 1 tablet by mouth once a day 6)  Truetrack Test Strp (Glucose blood) .... To test blood sugar 3 times per day 7)  Colace 100 Mg Caps (Docusate sodium) .... Take 1 tablet by mouth two times a day 8)  Lancing Device Misc (Lancet devices) .... Truetrac lancet device, lancet, test strips- testing - (2-3 times daily) 9)  Bd Pen Needle Short U/f 31g X 8 Mm Misc (Insulin pen needle) .... Inject twice daily as directed 10)  Simvastatin 20 Mg Tabs (Simvastatin) .Marland Kitchen.. 1 by mouth at bedtime 11)  Neurontin 800 Mg Tabs (Gabapentin) .... Take 1 tablet in am and 2 tablets in pm by mouth each day 12)  Hyzaar 100-25 Mg Tabs (Losartan potassium-hctz) .... Take 1 tablet by mouth once a day 13)  Amlodipine Besylate 5 Mg Tabs (Amlodipine besylate) .... Take 1 tablet by mouth once a day 14)  Aspirin 81 Mg Tbec (Aspirin) .... Take one tablet by mouth daily 15)  Isosorbide Mononitrate Cr 60 Mg Xr24h-tab (Isosorbide mononitrate) .... Take 1 tablet by mouth once a day 16)  Toprol Xl 25 Mg Xr24h-tab (Metoprolol succinate) .... Take 1/2 tablet by mouth once a day 17)  Diazepam 5 Mg Tabs (Diazepam) .... Take 1 tablet by mouth every 12 hours as needed for muscle spasm and anxiety 18)  Percocet 10-325 Mg Tabs (Oxycodone-acetaminophen) .... Take 1 tablet twice daily as needed for pain 19)  Misoprostol 200 Mcg Tabs (Misoprostol) .Marland Kitchen.. 1 tab three times a day 20)  Metformin Hcl 1000 Mg Tabs (Metformin hcl) .Marland Kitchen.. 1 tab two times a day  Other Orders: Influenza Vaccine MCR (84696) T-Basic Metabolic Panel ([id number])  Patient Instructions: 1)  Please schedule a follow-up appointment in 3 months. Prescriptions: BD PEN NEEDLE SHORT U/F 31G X 8 MM MISC (INSULIN PEN NEEDLE) Inject twice daily as directed  #100 x 3   Entered and Authorized by:   [name]  DO   Signed by:   [name]  DO on 08/12/2010   Method used:    Electronically to        [facility] [phone]* (retail)       [address], [place]  [address]       Ph: [phone] or [phone]       Fax: [phone]   RxID:   [id number] NOVOLOG MIX 70/30 FLEXPEN 70-30 % SUSP (INSULIN ASPART PROT & ASPART) Inject 37 units subq in AM and 26 units in PM as directed  #1 month x 11   Entered and Authorized  by:   [name]  DO   Signed by:   [name]  DO on 08/12/2010   Method used:   [facility] to        [facility] [phone][facility] (retail)       [address]       [place], [place]  [address]       Ph: [phone] or [phone]       Fax: [phone]   RxID:   [id number]  BD Pen Needles and Novalog Mix 70/30 FlexPen prescriptions have been transferred to the [facility] on [address].  Pt has moved and wants to change pharmacies. Angelina Ok RN  August 13, 2010 10:16 AM   Orders Added: 1)  T- Capillary Blood Glucose [82948] 2)  T-Hgb A1C (in-house) [83036QW] 3)  Est. Patient Level IV [36644] 4)  Influenza Vaccine MCR [00025] 5)  Podiatry Referral [Podiatry] 6)  T-Basic Metabolic Panel [[id number]]   Immunizations Administered:  Influenza Vaccine # 1:    Vaccine Type: Fluvax MCR    Site: left deltoid    Mfr: GlaxoSmithKline    Dose: 0.5 ml    Route: IM    Given by: Hessie Diener, SN//Gladys Herbin, RN    Exp. Date: 04/24/2011    Lot #: [id number]    VIS given: 05/18/07 version given August 12, 2010.  Flu Vaccine Consent Questions:    Do you have a history of severe allergic reactions to this vaccine? no    Any prior history of allergic reactions to egg and/or gelatin? no    Do you have a sensitivity to the preservative Thimersol? no    Do you have a past history of Guillan-Barre Syndrome? no    Do you currently have an acute febrile illness? no    Have you ever had a severe reaction to latex? no    Vaccine information given and explained to patient? yes    Are you currently pregnant?  no   Immunizations Administered:  Influenza Vaccine # 1:    Vaccine Type: Fluvax MCR    Site: left deltoid    Mfr: GlaxoSmithKline    Dose: 0.5 ml    Route: IM    Given by: Hessie Diener, SN//Gladys Herbin, RN    Exp. Date: 04/24/2011    Lot #: [id number]    VIS given: 05/18/07 version given August 12, 2010.  Prevention & Chronic Care Immunizations   Influenza vaccine: Fluvax MCR  (08/12/2010)   Influenza vaccine deferral: Refused  (08/12/2010)   Influenza vaccine due: 06/25/2009    Tetanus booster: Not documented   Td booster deferral: Deferred  (04/29/2009)    Pneumococcal vaccine: Not documented    H. zoster vaccine: Not documented  Colorectal Screening   Hemoccult: Not documented   Hemoccult action/deferral: Deferred  (04/29/2009)    Colonoscopy: Not documented   Colonoscopy action/deferral: Deferred  (04/29/2009)  Other Screening   Pap smear: Not documented   Pap smear action/deferral: Deferred-3 yr interval  (11/25/2009)    Mammogram: No specific mammographic evidence of malignancy.    (09/22/2009)   Mammogram action/deferral: Ordered  (10/30/2009)   Mammogram due: 03/2007    DXA bone density scan: Not documented  Reports requested:  Smoking status: quit  (08/12/2010)  Diabetes Mellitus   HgbA1C: 6.4  (08/12/2010)   Hemoglobin A1C due: 08/25/2007    Eye exam: No diabetic retinopathy.     (12/25/2008)   Last eye exam report requested.   Eye exam due:  12/2009    Foot exam: yes  (08/12/2010)   High risk foot: No  (08/12/2010)   Foot care education: Done  (08/12/2010)   Foot exam due: 09/05/2007    Urine microalbumin/creatinine ratio: 148.7  (05/25/2007)   Urine microalbumin action/deferral: Not indicated   Urine microalbumin/cr due: 05/24/2008    Diabetes flowsheet reviewed?: Yes   Progress toward A1C goal: At goal  Lipids   Total Cholesterol: 177  (06/05/2009)   Lipid panel action/deferral: Deferred   LDL: 89  (06/05/2009)   LDL Direct:  Not documented   HDL: 42  (06/05/2009)   Triglycerides: 230  (06/05/2009)   Lipid panel due: 05/24/2008    SGOT (AST): 11  (12/18/2009)   SGPT (ALT): 9  (12/18/2009)   Alkaline phosphatase: 65  (12/18/2009)   Total bilirubin: 0.3  (12/18/2009)    Lipid flowsheet reviewed?: Yes   Progress toward LDL goal: Unchanged  Hypertension   Last Blood Pressure: 121 / 67  (08/12/2010)   Serum creatinine: 1.60  (12/18/2009)   Serum potassium 4.4  (12/18/2009)    Hypertension flowsheet reviewed?: Yes   Progress toward BP goal: At goal  Self-Management Support :   Personal Goals (by the next clinic visit) :     Personal A1C goal: 7  (11/25/2009)     Personal blood pressure goal: 120/80  (11/25/2009)     Personal LDL goal: 70  (11/25/2009)    Patient will work on the following items until the next clinic visit to reach self-care goals:     Medications and monitoring: take my medicines every day, check my blood sugar, bring all of my medications to every visit, examine my feet every day  (08/12/2010)     Eating: drink diet soda or water instead of juice or soda, eat more vegetables, use fresh or frozen vegetables, eat foods that are low in salt, eat baked foods instead of fried foods, eat fruit for snacks and desserts, limit or avoid alcohol  (08/12/2010)     Activity: take a 30 minute walk every day  (08/12/2010)     Other: try not to eat processed meats  (04/29/2009)    Diabetes self-management support: Written self-care plan, Education handout, Pre-printed educational material, Resources for patients handout  (08/12/2010)   Diabetes care plan printed   Diabetes education handout printed   Last diabetes self-management training by diabetes educator: 06/05/2007   Last medical nutrition therapy: 06/05/2007    Hypertension self-management support: Written self-care plan, Education handout, Pre-printed educational material, Resources for patients handout  (08/12/2010)   Hypertension self-care  plan printed.   Hypertension education handout printed    Lipid self-management support: Written self-care plan, Education handout, Pre-printed educational material, Resources for patients handout  (08/12/2010)   Lipid self-care plan printed.   Lipid education handout printed      Resource handout printed.   Nursing Instructions: Request report of last diabetic eye exam    Laboratory Results   Blood Tests   Date/Time Recieved: August 12, 2010 3:02 PM  Date/Time Reported: Alric Quan  August 12, 2010 3:02 PM   HGBA1C: 6.4%   (Normal Range: Non-Diabetic - 3-6%   Control Diabetic - 6-8%) CBG Random: 96       Last LDL:  89 (06/05/2009 9:24:00 PM)          Diabetic Foot Exam Foot Inspection Is there a history of a foot ulcer?              No Is there a foot ulcer now?              No Can the patient see the bottom of their feet?          Yes Are the shoes appropriate in style and fit?          Yes Is there swelling or an abnormal foot shape?          No Are the toenails long?                No Are the toenails thick?                No Are the toenails ingrown?              No Is there heavy callous build-up?              No Is there a claw toe deformity?                          No Is there elevated skin temperature?            No Is there limited ankle dorsiflexion?            No Is there foot or ankle muscle weakness?            No Do you have pain in calf while walking?           Yes      Diabetic Foot Care Education :Patient educated on appropriate care of diabetic feet.  Comments: Callus build-up great toe right and left foot. High Risk Feet? No   10-g (5.07) Semmes-Weinstein Monofilament Test Performed by: Angelina Ok RN          Right Foot          Left Foot Visual Inspection               Test Control      normal         normal Site 1         normal         normal Site 2         normal          normal Site 3         normal         normal Site 4         normal         normal Site 5         normal         normal Site 6         normal         normal Site 7         normal         normal Site 8         normal         normal Site 9         normal         normal Site 10         normal         normal  Impression  normal         normal  Laboratory Results   Blood Tests     HGBA1C: 6.4%   (Normal Range: Non-Diabetic - 3-6%   Control Diabetic - 6-8%) CBG Random:: 96mg /dL       Process Orders Check Orders Results:     [facility]: Check successful Tests Sent for requisitioning (November 06, 2010 1:17 PM):     08/12/2010: [facility] -- T-Basic Metabolic Panel [id number] (signed)

## 2010-12-04 ENCOUNTER — Other Ambulatory Visit: Payer: Medicare Other

## 2010-12-04 DIAGNOSIS — E785 Hyperlipidemia, unspecified: Secondary | ICD-10-CM

## 2010-12-04 LAB — BASIC METABOLIC PANEL
CO2: 23 mEq/L (ref 19–32)
Calcium: 9.5 mg/dL (ref 8.4–10.5)
Chloride: 106 mEq/L (ref 96–112)
Creat: 1.75 mg/dL — ABNORMAL HIGH (ref 0.40–1.20)
Glucose, Bld: 51 mg/dL — ABNORMAL LOW (ref 70–99)
Sodium: 139 mEq/L (ref 135–145)

## 2010-12-04 LAB — LIPID PANEL
HDL: 23 mg/dL — ABNORMAL LOW (ref >39)
LDL Cholesterol: 40 mg/dL (ref 0–99)
Total CHOL/HDL Ratio: 3.7 Ratio

## 2010-12-07 ENCOUNTER — Telehealth: Payer: Self-pay | Admitting: Dietician

## 2010-12-07 NOTE — Telephone Encounter (Signed)
Patient says she has not had symptoms of low blood sugar and didn't know about her blood sugar was  low. Last time she checked her blood sugar was yesterday and it was 81 before dinner. She had been sick (nausua/vomniting and diarrhea) and had stopped metformin and insulin and was  not self monitoring much. She is better now, except abdomen still hurts and restarted both insulin and metformin on Saturday when she ate solid foods. Also back to self monitoring as usual. Patient reports she ate sausage biscuit, applesauce, yogurt and hot cider for breakfast today, is trying to drink more water. Patient also reports intermittent diarrhea x 1 year (recently nocturnally) since on antibiotic for dental surgery ~ 1 year ago.  [name] agreed to decrease her insulin by 1 unit each dose, take metformin with food and continue to self monitor and try to drink more liquids and water. Unable to get her an appointment with Dr. Gwynneth Albright. Will discuss with Dr. Phillips Odor urgency of return visit, consider holding PM dose of metformin until stools back to normal consistency.

## 2010-12-08 MED ORDER — METFORMIN HCL 1000 MG PO TABS: 1000.0000 mg | ORAL_TABLET | Freq: Two times a day (BID) | ORAL | Status: DC

## 2010-12-08 MED ORDER — INSULIN ASPART PROT & ASPART (70-30 MIX) 100 UNIT/ML ~~LOC~~ SUSP: SUBCUTANEOUS | Status: DC

## 2010-12-08 NOTE — Telephone Encounter (Signed)
Likely a viral GI illness. Agree with 1 unit decrease and holding PM Metformin. No urgency of a return visit. Will see her at her regular 3 month interval. I noticed on her labs that her renal function was slightly worse-I suspect it is from some mild dehydration and NSAID use for her pain. Please call her and confirm plan. Encourage monitoring and if consistently low we can see her sooner. Thanks.

## 2010-12-09 ENCOUNTER — Other Ambulatory Visit: Payer: Self-pay | Admitting: Dietician

## 2010-12-09 ENCOUNTER — Telehealth: Payer: Self-pay | Admitting: Internal Medicine

## 2010-12-09 NOTE — Telephone Encounter (Signed)
Metformin updated

## 2010-12-09 NOTE — Telephone Encounter (Signed)
Called patient pre physician request. "Doing okay, blood sugar 130 yesterday, has taken insulin this am but not checked blood sugar yet. Appetite getting there, abdominal pain improving. [name] verbalized understanding to continued insulin doses at 1 unit decrease each( 36 units in am , 25 unit sin pm) , hold PM metformin until stools back to normal( and stops messing herself nocturnally) , check blood sugar prior to taking insulin twice daily and  Call if blood sugars get out of control

## 2010-12-14 ENCOUNTER — Other Ambulatory Visit: Payer: Self-pay | Admitting: Internal Medicine

## 2010-12-16 NOTE — Assessment & Plan Note (Signed)
Summary: EST-CK/FU/MEDS/CFB   Vital Signs:  Patient profile:   60 year old female Height:      65 inches (165.10 cm) Weight:      204.3 pounds (92.86 kg) BMI:     34.12 Temp:     97.7 degrees F (36.50 degrees C) oral Pulse rate:   79 / minute BP sitting:   128 / 67  (left arm)  Vitals Entered By: [name] Kidney Ditzler RN (November 20, 2010 10:33 AM) Is Patient Diabetic? Yes Did you bring your meter with you today? Yes Pain Assessment Patient in pain? yes     Location: feet and back Intensity: 6-8 Type: hurts Onset of pain  long time Nutritional Status BMI of > 30 = obese Nutritional Status Detail appetite good  Have you ever been in a relationship where you felt threatened, hurt or afraid?denies   Does patient need assistance? Functional Status Self care Ambulation Normal Comments # month FY and results MRI back. Occ tightness in chest past year - sl better.   Primary Care Provider:  [name]  DO   History of Present Illness: [name] comes in today for routine follow-up. She continues to do well with her DM control. Her major complaints today are continued lower extremity pain and back pain. Additionally she tells me that she is having surgery for a right 5th metatarsal stress fracture in her right foot. Podiatry is following. She also reports increased stress at home. The car she uses for transpiortation is having problems and it has been financially difficult for them for the past few months. This is creating strain within her marriage and family life.  Depression History:      The patient denies a depressed mood most of the day and a diminished interest in her usual daily activities.         Preventive Screening-Counseling & Management  Alcohol-Tobacco     Alcohol drinks/day: no     Smoking Status: quit     Year Quit: 10 years ago  Caffeine-Diet-Exercise     Does Patient Exercise: no  Current Medications (verified): 1)  Wellbutrin Xl 300 Mg Xr24h-Tab (Bupropion  Hcl) .... Take 1 Tablet By Mouth Once A Day Per Dr. Marney Setting 2)  Celexa 40 Mg Tabs (Citalopram Hydrobromide) .... Take 1 Tablet By Mouth Once A Day 3)  Trazodone Hcl 100 Mg Tabs (Trazodone Hcl) .... Take 1 Tablet By Mouth Once A Day At Bedtime For Sleep 4)  Novolog Mix 70/30 Flexpen 70-30 % Susp (Insulin Aspart Prot & Aspart) .... Inject 37 Units Subq in Am 15 Minutes Before Breakfast and 26 Units in Pm 15 Minutes Before Evening Meal 5)  Dexilant 60 Mg Cpdr (Dexlansoprazole) .... Take 1 Tablet By Mouth Once A Day 6)  Truetrack Test  Strp (Glucose Blood) .... To Test Blood Sugar 3 Times Per Day 7)  Colace 100 Mg Caps (Docusate Sodium) .... Take 1 Tablet By Mouth Two Times A Day 8)  Lancing Device   Misc (Lancet Devices) .... Truetrac Lancet Device, Lancet, Test Strips- Testing - (2-3 Times Daily) 9)  Simvastatin 20 Mg Tabs (Simvastatin) .Marland Kitchen.. 1 By Mouth At Bedtime 10)  Neurontin 800 Mg Tabs (Gabapentin) .... Take 1 Tablet in Am and 2 Tablets in Pm By Mouth Each Day 11)  Hyzaar 100-25 Mg Tabs (Losartan Potassium-Hctz) .... Take 1 Tablet By Mouth Once A Day 12)  Amlodipine Besylate 5 Mg Tabs (Amlodipine Besylate) .... Take 1 Tablet By Mouth Once A Day 13)  Aspirin 81 Mg Tbec (Aspirin) .... Take One Tablet By Mouth Daily 14)  Isosorbide Mononitrate Cr 60 Mg Xr24h-Tab (Isosorbide Mononitrate) .... Take 1 Tablet By Mouth Once A Day 15)  Toprol Xl 25 Mg Xr24h-Tab (Metoprolol Succinate) .... Take 1/2 Tablet By Mouth Once A Day 16)  Diazepam 5 Mg Tabs (Diazepam) .... Take 1 Tablet By Mouth Three Times A Day As Needed For Muscle Spasm 17)  Percocet 10-325 Mg Tabs (Oxycodone-Acetaminophen) .... Take 1 Tablet Twice Daily As Needed For Pain 18)  Misoprostol 200 Mcg Tabs (Misoprostol) .Marland Kitchen.. 1 Tab Three Times A Day 19)  Metformin Hcl 1000 Mg Tabs (Metformin Hcl) .Marland Kitchen.. 1 Tab Two Times A Day 20)  Tens Unit Alcohol Swabs, Adhesive Cleaner, Electro Pads .... Supplies As Needed. Dx: Muscle Spasm, Osteoarthritis 21)  Bd  Pen Needle Mini U/f 31g X 5 Mm Misc (Insulin Pen Needle) .... Use To Inject Insulin Twice Daily As Directed 22)  Indomethacin 25 Mg Caps (Indomethacin) .... Take 1 Tablet By Mouth Two Times A Day, May Take 1 Additional Table Once As Needed Between Doses. 23)  Voltaren 1 % Gel (Diclofenac Sodium) .... Apply Twice Daily As Needed 24)  Zewa Tens 502  Devi (Nerve Stimulator) .... Tens Unit For Low Back Pain.muscle Spasm, Lumbago. Use 2-3 Times A Day As Directed For Back Pain and Muscle Spasm  Allergies: 1)  ! Penicillin 2)  ! Sulfa 3)  ! Tagamet Hb (Cimetidine)  Family History: Reviewed history from 03/19/2009 and no changes required. F: died 61s of MI M: has DM, HTN, HLD, arthritis (probably OA), 61yo, living Bro: DM Daughter: DM  Social History: Reviewed history from 03/19/2009 and no changes required. Married. No ETOH No Tobacco (quit 2004; smoked 30 years 1/2ppd ==> 15pack year hx) No Drugs   Review of Systems      See HPI  Physical Exam  General:  alert and well-developed.   Head:  normocephalic and atraumatic.  no abnormalities observed.   Eyes:  vision grossly intact.   Nose:  no external deformity, no external erythema, and no nasal discharge.   Mouth:  recent dental work and dentures no lesions. Neck:  supple and no masses.   Lungs:  normal respiratory effort.   Heart:  normal rate, regular rhythm, and no murmur.   Abdomen:  soft.   Msk:  Tenderness acc4ross her sacroilliac region and point tenderness over bilateral SI joints. Pain down leg reproducible with bilateral straight leg paise. She  also has deiminished patellar reflexes and mild sensory loss in her distal foot.   Point tenderness over proximal 5th metatarsal, pain intense, Gait antalgic. Mild swelling but no erythema.  Extremities:  no edema  Diabetes Management Exam:    Foot Exam (with socks and/or shoes not present):       Sensory-Pinprick/Light touch:          Left medial foot (L-4): normal           Left dorsal foot (L-5): normal          Left lateral foot (S-1): normal          Right medial foot (L-4): normal          Right dorsal foot (L-5): normal          Right lateral foot (S-1): normal       Sensory-Monofilament:          Left foot: diminished          Right foot:  diminished       Inspection:          Left foot: abnormal             Comments: callus formation lateral forefoot, flat arch          Right foot: abnormal             Comments: callus formation over lateral forefoot, great toe and flat arches       Nails:          Left foot: thickened          Right foot: thickened   Impression & Recommendations:  Problem # 1:  DEGENERATIVE JOINT DISEASE, BACK (ICD-715.98) Will start her on 2 week trial of NSAID/Indomethacin for pain. Continue percocet for breakthrough pain. MRI of L-Spine and Sacrum reviewed. Will check a BMET in 2 weeks to follow her renal function on NSAIDS.  Her updated medication list for this problem includes:    Aspirin 81 Mg Tbec (Aspirin) .Marland Kitchen... Take one tablet by mouth daily    Percocet 10-325 Mg Tabs (Oxycodone-acetaminophen) .Marland Kitchen... Take 1 tablet twice daily as needed for pain    Indomethacin 25 Mg Caps (Indomethacin) .Marland Kitchen... Take 1 tablet by mouth two times a day, may take 1 additional table once as needed between doses.  Her updated medication list for this problem includes:    Aspirin 81 Mg Tbec (Aspirin) .Marland Kitchen... Take one tablet by mouth daily    Percocet 10-325 Mg Tabs (Oxycodone-acetaminophen) .Marland Kitchen... Take 1 tablet twice daily as needed for pain  Problem # 2:  STRESS FRACTURE, FOOT (ICD-733.94) New poorly healing stress fracture in right foot. Podiatry following, surgery planned. Discussed the need to wear proper footwear for support and stability.  Problem # 3:  PERIPHERAL NEUROPATHY (ICD-356.9) Stable no change. Patient has neuropathy in both feet and hands. Complicated by additional degenerative disease in the ankles and also in her wrists. Will  increase her gabapentin today. Discussed side effects of drowsiness with her.   Problem # 4:  DIABETES MELLITUS, TYPE II (ICD-250.00) Doing very well on her current regimen. She does not report any lows recently. Her A1C is nearly perfect which is a huge accomplishment for her given past A1C levels. I recommended that she continue to see Jamison Neighbor for DM education and routine follow-up.  Her updated medication list for this problem includes:    Novolog Mix 70/30 Flexpen 70-30 % Susp (Insulin aspart prot & aspart) ..... Inject 37 units subq in am 15 minutes before breakfast and 26 units in pm 15 minutes before evening meal    Hyzaar 100-25 Mg Tabs (Losartan potassium-hctz) .Marland Kitchen... Take 1 tablet by mouth once a day    Aspirin 81 Mg Tbec (Aspirin) .Marland Kitchen... Take one tablet by mouth daily    Metformin Hcl 1000 Mg Tabs (Metformin hcl) .Marland Kitchen... 1 tab two times a day  Orders: T- Capillary Blood Glucose (82948) T-Hgb A1C (in-house) (78295AO)  Complete Medication List: 1)  Wellbutrin Xl 300 Mg Xr24h-tab (Bupropion hcl) .... Take 1 tablet by mouth once a day per dr. Marney Setting 2)  Celexa 40 Mg Tabs (Citalopram hydrobromide) .... Take 1 tablet by mouth once a day 3)  Trazodone Hcl 100 Mg Tabs (Trazodone hcl) .... Take 1 tablet by mouth once a day at bedtime for sleep 4)  Novolog Mix 70/30 Flexpen 70-30 % Susp (Insulin aspart prot & aspart) .... Inject 37 units subq in am 15 minutes before breakfast and 26 units in  pm 15 minutes before evening meal 5)  Dexilant 60 Mg Cpdr (Dexlansoprazole) .... Take 1 tablet by mouth once a day 6)  Truetrack Test Strp (Glucose blood) .... To test blood sugar 3 times per day 7)  Colace 100 Mg Caps (Docusate sodium) .... Take 1 tablet by mouth two times a day 8)  Lancing Device Misc (Lancet devices) .... Truetrac lancet device, lancet, test strips- testing - (2-3 times daily) 9)  Simvastatin 20 Mg Tabs (Simvastatin) .Marland Kitchen.. 1 by mouth at bedtime 10)  Neurontin 800 Mg Tabs (Gabapentin)  .... Take 1 tablet in am and 2 tablets in pm by mouth each day 11)  Hyzaar 100-25 Mg Tabs (Losartan potassium-hctz) .... Take 1 tablet by mouth once a day 12)  Amlodipine Besylate 5 Mg Tabs (Amlodipine besylate) .... Take 1 tablet by mouth once a day 13)  Aspirin 81 Mg Tbec (Aspirin) .... Take one tablet by mouth daily 14)  Isosorbide Mononitrate Cr 60 Mg Xr24h-tab (Isosorbide mononitrate) .... Take 1 tablet by mouth once a day 15)  Toprol Xl 25 Mg Xr24h-tab (Metoprolol succinate) .... Take 1/2 tablet by mouth once a day 16)  Diazepam 5 Mg Tabs (Diazepam) .... Take 1 tablet by mouth three times a day as needed for muscle spasm 17)  Percocet 10-325 Mg Tabs (Oxycodone-acetaminophen) .... Take 1 tablet twice daily as needed for pain 18)  Misoprostol 200 Mcg Tabs (Misoprostol) .Marland Kitchen.. 1 tab three times a day 19)  Metformin Hcl 1000 Mg Tabs (Metformin hcl) .Marland Kitchen.. 1 tab two times a day 20)  Tens Unit Alcohol Swabs, Adhesive Cleaner, Electro Pads  .... Supplies as needed. dx: muscle spasm, osteoarthritis 21)  Bd Pen Needle Mini U/f 31g X 5 Mm Misc (Insulin pen needle) .... Use to inject insulin twice daily as directed 22)  Indomethacin 25 Mg Caps (Indomethacin) .... Take 1 tablet by mouth two times a day, may take 1 additional table once as needed between doses. 23)  Voltaren 1 % Gel (Diclofenac sodium) .... Apply twice daily as needed 24)  Zewa Tens 502 Devi (Nerve stimulator) .... Tens unit for low back pain.muscle spasm, lumbago. use 2-3 times a day as directed for back pain and muscle spasm  Patient Instructions: 1)  Please schedule a follow-up appointment in 2 weeks for lab only appointment-orders to be placed. Prescriptions: VOLTAREN 1 % GEL (DICLOFENAC SODIUM) apply twice daily as needed  #1 tube x prn   Entered and Authorized by:   [name]  DO   Signed by:   [name]  DO on 11/20/2010   Method used:   Print then Give to Patient   RxID:   [id number] INDOMETHACIN 25 MG CAPS  (INDOMETHACIN) Take 1 tablet by mouth two times a day, may take 1 additional table once as needed between doses.  #90 x 0   Entered and Authorized by:   [name]  DO   Signed by:   [name]  DO on 11/20/2010   Method used:   Print then Give to Patient   RxID:   [id number] DIAZEPAM 5 MG TABS (DIAZEPAM) Take 1 tablet by mouth three times a day as needed for muscle spasm  #90 x 0   Entered and Authorized by:   [name]  DO   Signed by:   [name]  DO on 11/20/2010   Method used:   Print then Give to Patient   RxID:   [id number] PERCOCET 10-325 MG TABS (OXYCODONE-ACETAMINOPHEN) Take 1 tablet  twice daily as needed for pain  #60 x 0   Entered and Authorized by:   [name]  DO   Signed by:   [name]  DO on 11/20/2010   Method used:   Print then Give to Patient   RxID:   [id number]    Orders Added: 1)  T- Capillary Blood Glucose [82948] 2)  T-Hgb A1C (in-house) [83036QW] 3)  Est. Patient Level IV [51884]     Prevention & Chronic Care Immunizations   Influenza vaccine: Fluvax MCR  (08/12/2010)   Influenza vaccine deferral: Refused  (08/12/2010)   Influenza vaccine due: 06/25/2009    Tetanus booster: Not documented   Td booster deferral: Deferred  (04/29/2009)    Pneumococcal vaccine: Not documented    H. zoster vaccine: Not documented  Colorectal Screening   Hemoccult: Not documented   Hemoccult action/deferral: Deferred  (04/29/2009)    Colonoscopy: Not documented   Colonoscopy action/deferral: Deferred  (04/29/2009)  Other Screening   Pap smear: Not documented   Pap smear action/deferral: Deferred-3 yr interval  (11/25/2009)    Mammogram: No specific mammographic evidence of malignancy.    (09/22/2009)   Mammogram action/deferral: Ordered  (10/30/2009)   Mammogram due: 03/2007    DXA bone density scan: Not documented   Smoking status: quit  (11/20/2010)  Diabetes Mellitus   HgbA1C: 6.2  (11/20/2010)   Hemoglobin A1C  due: 08/25/2007    Eye exam: No diabetic retinopathy.     (12/25/2008)   Eye exam due: 12/2009    Foot exam: yes  (11/20/2010)   High risk foot: No  (08/12/2010)   Foot care education: Done  (08/12/2010)   Foot exam due: 09/05/2007    Urine microalbumin/creatinine ratio: 148.7  (05/25/2007)   Urine microalbumin action/deferral: Not indicated   Urine microalbumin/cr due: 05/24/2008  Lipids   Total Cholesterol: 177  (06/05/2009)   Lipid panel action/deferral: Deferred   LDL: 89  (06/05/2009)   LDL Direct: Not documented   HDL: 42  (06/05/2009)   Triglycerides: 230  (06/05/2009)   Lipid panel due: 05/24/2008    SGOT (AST): 11  (12/18/2009)   SGPT (ALT): 9  (12/18/2009)   Alkaline phosphatase: 65  (12/18/2009)   Total bilirubin: 0.3  (12/18/2009)  Hypertension   Last Blood Pressure: 128 / 67  (11/20/2010)   Serum creatinine: 1.64  (08/12/2010)   Serum potassium 4.5  (08/12/2010)  Self-Management Support :   Personal Goals (by the next clinic visit) :     Personal A1C goal: 7  (11/25/2009)     Personal blood pressure goal: 120/80  (11/25/2009)     Personal LDL goal: 70  (11/25/2009)    Patient will work on the following items until the next clinic visit to reach self-care goals:     Medications and monitoring: take my medicines every day, check my blood sugar, bring all of my medications to every visit, examine my feet every day  (11/20/2010)     Eating: drink diet soda or water instead of juice or soda, eat more vegetables, use fresh or frozen vegetables, eat fruit for snacks and desserts, limit or avoid alcohol  (11/20/2010)     Activity: take a 30 minute walk every day, park at the far end of the parking lot  (11/20/2010)     Other: try not to eat processed meats  (04/29/2009)    Diabetes self-management support: Copy of home glucose meter record, Written self-care plan, Education handout, Resources for patients handout  (  11/20/2010)   Diabetes care plan printed    Diabetes education handout printed   Last diabetes self-management training by diabetes educator: 08/17/2010   Last medical nutrition therapy: 06/05/2007    Hypertension self-management support: Written self-care plan, Resources for patients handout, Education handout  (11/20/2010)   Hypertension self-care plan printed.   Hypertension education handout printed    Lipid self-management support: Written self-care plan, Resources for patients handout, Education handout  (11/20/2010)   Lipid self-care plan printed.   Lipid education handout printed      Resource handout printed.  Laboratory Results   Blood Tests   Date/Time Received: November 20, 2010 10:49 AM  Date/Time Reported: Burke Keels  November 20, 2010 10:49 AM   HGBA1C: 6.2%   (Normal Range: Non-Diabetic - 3-6%   Control Diabetic - 6-8%) CBG Fasting:: 94mg /dL

## 2010-12-22 NOTE — Progress Notes (Signed)
Summary: refill/ hla  Phone Note Call from Patient   Summary of Call: pt calls and ask if you are getting her a TENS unit? Initial call taken by: Marin Roberts RN,  November 20, 2010 5:16 PM  Follow-up for Phone Call        yes I think this would be a great idea. I will put in script with order. I put script in today. Follow-up by: [name]  DO,  November 23, 2010 11:55 AM    New/Updated Medications: ZEWA TENS 502  DEVI (NERVE STIMULATOR) TENS unit for LOW BACK pain.Muscle spasm, Lumbago. Use 2-3 times a day as directed for back pain and muscle spasm Prescriptions: ZEWA TENS 502  DEVI (NERVE STIMULATOR) TENS unit for LOW BACK pain.Muscle spasm, Lumbago. Use 2-3 times a day as directed for back pain and muscle spasm  #1 x 0   Entered and Authorized by:   [name]  DO   Signed by:   [name]  DO on 11/23/2010   Method used:   Handwritten   RxID:   [id number]

## 2011-01-06 LAB — GLUCOSE, CAPILLARY: Glucose-Capillary: 96 mg/dL (ref 70–99)

## 2011-01-11 LAB — COMPREHENSIVE METABOLIC PANEL WITH GFR
ALT: 15 U/L (ref 0–35)
AST: 17 U/L (ref 0–37)
Albumin: 3.7 g/dL (ref 3.5–5.2)
Alkaline Phosphatase: 61 U/L (ref 39–117)
BUN: 19 mg/dL (ref 6–23)
CO2: 25 meq/L (ref 19–32)
Calcium: 9.5 mg/dL (ref 8.4–10.5)
Chloride: 106 meq/L (ref 96–112)
Creatinine, Ser: 1.56 mg/dL — ABNORMAL HIGH (ref 0.4–1.2)
GFR calc non Af Amer: 34 mL/min — ABNORMAL LOW
Glucose, Bld: 70 mg/dL (ref 70–99)
Potassium: 4.4 meq/L (ref 3.5–5.1)
Sodium: 139 meq/L (ref 135–145)
Total Bilirubin: 0.3 mg/dL (ref 0.3–1.2)
Total Protein: 7.4 g/dL (ref 6.0–8.3)

## 2011-01-11 LAB — DIFFERENTIAL
Basophils Absolute: 0 10*3/uL (ref 0.0–0.1)
Basophils Relative: 0 % (ref 0–1)
Eosinophils Absolute: 0.1 10*3/uL (ref 0.0–0.7)
Eosinophils Relative: 1 % (ref 0–5)
Lymphocytes Relative: 53 % — ABNORMAL HIGH (ref 12–46)
Lymphs Abs: 3.9 10*3/uL (ref 0.7–4.0)
Monocytes Absolute: 0.4 10*3/uL (ref 0.1–1.0)
Monocytes Absolute: 0.5 10*3/uL (ref 0.1–1.0)
Monocytes Relative: 6 % (ref 3–12)
Neutro Abs: 3.1 10*3/uL (ref 1.7–7.7)
Neutrophils Relative %: 40 % — ABNORMAL LOW (ref 43–77)

## 2011-01-11 LAB — CK TOTAL AND CKMB (NOT AT ARMC)
CK, MB: 1.3 ng/mL (ref 0.3–4.0)
CK, MB: 2.1 ng/mL (ref 0.3–4.0)
CK, MB: 2.7 ng/mL (ref 0.3–4.0)
Relative Index: 1.3 (ref 0.0–2.5)
Relative Index: 1.6 (ref 0.0–2.5)
Relative Index: INVALID (ref 0.0–2.5)
Total CK: 168 U/L (ref 7–177)
Total CK: 170 U/L (ref 7–177)
Total CK: 177 U/L (ref 7–177)
Total CK: 99 U/L (ref 7–177)

## 2011-01-11 LAB — CBC
HCT: 30.6 % — ABNORMAL LOW (ref 36.0–46.0)
HCT: 33.9 % — ABNORMAL LOW (ref 36.0–46.0)
HCT: 35.9 % — ABNORMAL LOW (ref 36.0–46.0)
Hemoglobin: 10 g/dL — ABNORMAL LOW (ref 12.0–15.0)
Hemoglobin: 11 g/dL — ABNORMAL LOW (ref 12.0–15.0)
Hemoglobin: 11 g/dL — ABNORMAL LOW (ref 12.0–15.0)
Hemoglobin: 11.8 g/dL — ABNORMAL LOW (ref 12.0–15.0)
MCHC: 32.4 g/dL (ref 30.0–36.0)
MCHC: 32.7 g/dL (ref 30.0–36.0)
MCHC: 33 g/dL (ref 30.0–36.0)
MCV: 75.8 fL — ABNORMAL LOW (ref 78.0–100.0)
MCV: 76 fL — ABNORMAL LOW (ref 78.0–100.0)
MCV: 76.3 fL — ABNORMAL LOW (ref 78.0–100.0)
MCV: 77.6 fL — ABNORMAL LOW (ref 78.0–100.0)
Platelets: 256 10*3/uL (ref 150–400)
Platelets: 257 K/uL (ref 150–400)
RBC: 4.01 MIL/uL (ref 3.87–5.11)
RBC: 4.37 MIL/uL (ref 3.87–5.11)
RBC: 4.37 MIL/uL (ref 3.87–5.11)
RDW: 16.7 % — ABNORMAL HIGH (ref 11.5–15.5)
RDW: 17.1 % — ABNORMAL HIGH (ref 11.5–15.5)
WBC: 10.1 10*3/uL (ref 4.0–10.5)
WBC: 7.7 10*3/uL (ref 4.0–10.5)
WBC: 7.7 K/uL (ref 4.0–10.5)

## 2011-01-11 LAB — GLUCOSE, CAPILLARY
Glucose-Capillary: 135 mg/dL — ABNORMAL HIGH (ref 70–99)
Glucose-Capillary: 94 mg/dL (ref 70–99)

## 2011-01-11 LAB — BASIC METABOLIC PANEL
CO2: 23 mEq/L (ref 19–32)
CO2: 27 mEq/L (ref 19–32)
CO2: 27 mEq/L (ref 19–32)
Calcium: 9.4 mg/dL (ref 8.4–10.5)
Chloride: 102 mEq/L (ref 96–112)
Chloride: 105 mEq/L (ref 96–112)
Chloride: 99 mEq/L (ref 96–112)
Creatinine, Ser: 1.46 mg/dL — ABNORMAL HIGH (ref 0.4–1.2)
GFR calc Af Amer: 44 mL/min — ABNORMAL LOW (ref >60)
GFR calc Af Amer: 47 mL/min — ABNORMAL LOW (ref >60)
Glucose, Bld: 128 mg/dL — ABNORMAL HIGH (ref 70–99)
Glucose, Bld: 88 mg/dL (ref 70–99)
Potassium: 3.9 mEq/L (ref 3.5–5.1)
Sodium: 137 mEq/L (ref 135–145)
Sodium: 139 mEq/L (ref 135–145)

## 2011-01-11 LAB — POCT I-STAT, CHEM 8
BUN: 17 mg/dL (ref 6–23)
Calcium, Ion: 1.1 mmol/L — ABNORMAL LOW (ref 1.12–1.32)
Chloride: 106 mEq/L (ref 96–112)
Creatinine, Ser: 1.5 mg/dL — ABNORMAL HIGH (ref 0.4–1.2)
Glucose, Bld: 111 mg/dL — ABNORMAL HIGH (ref 70–99)
TCO2: 22 mmol/L (ref 0–100)

## 2011-01-11 LAB — POCT CARDIAC MARKERS: Troponin i, poc: <0.05 ng/mL (ref 0.00–0.09)

## 2011-01-11 LAB — TROPONIN I: Troponin I: 0.02 ng/mL (ref 0.00–0.06)

## 2011-01-11 LAB — MAGNESIUM: Magnesium: 2 mg/dL (ref 1.5–2.5)

## 2011-01-11 LAB — D-DIMER, QUANTITATIVE: D-Dimer, Quant: <0.22 ug/mL-FEU (ref 0.00–0.48)

## 2011-01-11 LAB — LIPID PANEL: VLDL: 24 mg/dL (ref 0–40)

## 2011-01-12 ENCOUNTER — Other Ambulatory Visit (INDEPENDENT_AMBULATORY_CARE_PROVIDER_SITE_OTHER): Payer: PRIVATE HEALTH INSURANCE | Admitting: Internal Medicine

## 2011-01-12 DIAGNOSIS — E785 Hyperlipidemia, unspecified: Secondary | ICD-10-CM

## 2011-01-12 DIAGNOSIS — M1990 Unspecified osteoarthritis, unspecified site: Secondary | ICD-10-CM

## 2011-01-12 DIAGNOSIS — D649 Anemia, unspecified: Secondary | ICD-10-CM

## 2011-01-12 DIAGNOSIS — E119 Type 2 diabetes mellitus without complications: Secondary | ICD-10-CM

## 2011-01-15 LAB — POCT I-STAT, CHEM 8
BUN: 16 mg/dL (ref 6–23)
Calcium, Ion: 1.22 mmol/L (ref 1.12–1.32)
Creatinine, Ser: 1.7 mg/dL — ABNORMAL HIGH (ref 0.4–1.2)
Glucose, Bld: 94 mg/dL (ref 70–99)
Sodium: 141 mEq/L (ref 135–145)
TCO2: 28 mmol/L (ref 0–100)

## 2011-01-18 NOTE — Telephone Encounter (Addendum)
This med was just started recently for DDD. BMP after starting showed elevated creatinine and Dr Reece Agar wanted pt eval (along with other reasons) but appt not made. Will place orders - pls have pt come in ASAP for labs and will ask front desk to make appt with any [facility] resident.   Pls let pt know to come for lab visit and that we are making an ppat to F/U some issues that Dr Phillips Odor wanted addressed. Thanks

## 2011-01-22 ENCOUNTER — Encounter: Payer: Self-pay | Admitting: Internal Medicine

## 2011-01-22 ENCOUNTER — Other Ambulatory Visit: Payer: Self-pay | Admitting: Internal Medicine

## 2011-01-22 ENCOUNTER — Ambulatory Visit (INDEPENDENT_AMBULATORY_CARE_PROVIDER_SITE_OTHER): Payer: PRIVATE HEALTH INSURANCE | Admitting: Internal Medicine

## 2011-01-22 VITALS — BP 131/65 | HR 68 | Temp 97.0°F | Ht 64.9 in | Wt 208.6 lb

## 2011-01-22 DIAGNOSIS — I129 Hypertensive chronic kidney disease with stage 1 through stage 4 chronic kidney disease, or unspecified chronic kidney disease: Secondary | ICD-10-CM

## 2011-01-22 DIAGNOSIS — N183 Chronic kidney disease, stage 3 unspecified: Secondary | ICD-10-CM | POA: Insufficient documentation

## 2011-01-22 DIAGNOSIS — Z1231 Encounter for screening mammogram for malignant neoplasm of breast: Secondary | ICD-10-CM

## 2011-01-22 DIAGNOSIS — Z0000 Encounter for general adult medical examination without abnormal findings: Secondary | ICD-10-CM

## 2011-01-22 DIAGNOSIS — I10 Essential (primary) hypertension: Secondary | ICD-10-CM

## 2011-01-22 DIAGNOSIS — M549 Dorsalgia, unspecified: Secondary | ICD-10-CM

## 2011-01-22 DIAGNOSIS — K219 Gastro-esophageal reflux disease without esophagitis: Secondary | ICD-10-CM

## 2011-01-22 DIAGNOSIS — E785 Hyperlipidemia, unspecified: Secondary | ICD-10-CM

## 2011-01-22 DIAGNOSIS — F329 Major depressive disorder, single episode, unspecified: Secondary | ICD-10-CM

## 2011-01-22 DIAGNOSIS — N179 Acute kidney failure, unspecified: Secondary | ICD-10-CM | POA: Insufficient documentation

## 2011-01-22 DIAGNOSIS — E119 Type 2 diabetes mellitus without complications: Secondary | ICD-10-CM

## 2011-01-22 DIAGNOSIS — F419 Anxiety disorder, unspecified: Secondary | ICD-10-CM

## 2011-01-22 DIAGNOSIS — I2510 Atherosclerotic heart disease of native coronary artery without angina pectoris: Secondary | ICD-10-CM

## 2011-01-22 DIAGNOSIS — N189 Chronic kidney disease, unspecified: Secondary | ICD-10-CM

## 2011-01-22 LAB — BASIC METABOLIC PANEL
BUN: 25 mg/dL — ABNORMAL HIGH (ref 6–23)
CO2: 26 mEq/L (ref 19–32)
Calcium: 9.1 mg/dL (ref 8.4–10.5)
Glucose, Bld: 82 mg/dL (ref 70–99)
Sodium: 140 mEq/L (ref 135–145)

## 2011-01-22 MED ORDER — METFORMIN HCL 1000 MG PO TABS: 1000.0000 mg | ORAL_TABLET | Freq: Two times a day (BID) | ORAL | Status: DC

## 2011-01-22 MED ORDER — INSULIN ASPART PROT & ASPART (70-30 MIX) 100 UNIT/ML ~~LOC~~ SUSP: SUBCUTANEOUS | Status: DC

## 2011-01-22 NOTE — Assessment & Plan Note (Signed)
Recheck bmet today 

## 2011-01-22 NOTE — Assessment & Plan Note (Signed)
Mammogram ordered today 

## 2011-01-22 NOTE — Progress Notes (Signed)
  Subjective:    Patient ID: [name], female    DOB: July 30, 1951, 60 y.o.   MRN: [id number]  HPI  60 yr old woman with  Past Medical History  Diagnosis Date  . Hypertension   . Peripheral neuropathy   . Cervicalgia   . Low back pain syndrome   . Hyperlipidemia   . Vitamin D deficiency   . Vitamin B12 deficiency   . Iliotibial band syndrome   . Anemia   . Borderline personality disorder   . Nonorganic psychosis   . GERD (gastroesophageal reflux disease)   . Diabetes mellitus   . Depression   . Diabetic gastroparesis   . Sleep apnea   . CAD (coronary artery disease)     Catherization 11/18/09>nonobstructive CAD , normal LV function, EF 65%  . Health maintenance examination     Colonoscopy 2009> normal; Diabetic eye exam 12/2008. No diabetic retinopathy; Mammogram 11/10> No evidence of malignancy    comes to the clinic for Diabetes management. Patient has no complains. Reports that diarrhea resolved.  Review of Systems  All other systems reviewed and are negative.       Objective:   Physical Exam  Vitals reviewed. Constitutional: She is oriented to person, place, and time. She appears well-developed and well-nourished. No distress.  HENT:  Mouth/Throat: Oropharynx is clear and moist.  Eyes: Conjunctivae and EOM are normal. Pupils are equal, round, and reactive to light.  Neck: Normal range of motion. Neck supple.  Cardiovascular: Normal rate, regular rhythm and normal heart sounds.   Pulmonary/Chest: Effort normal and breath sounds normal.  Abdominal: Soft. Bowel sounds are normal.  Musculoskeletal: Normal range of motion.  Neurological: She is alert and oriented to person, place, and time.  Skin: She is not diaphoretic.  Psychiatric: She has a normal mood and affect.          Assessment & Plan:

## 2011-01-22 NOTE — Assessment & Plan Note (Signed)
At goal. Continue current regimen. 

## 2011-01-22 NOTE — Patient Instructions (Signed)
Follow up with PCP in May. Restart taking Metformin

## 2011-01-22 NOTE — Assessment & Plan Note (Addendum)
Good control. Meter reviewed. No hypoglycemic episodes. Will have patient restart metformin twice a day and increase am/pm dose of insulin 1 unit as previously. Order Diabetic eye exam on follow up.

## 2011-01-25 ENCOUNTER — Other Ambulatory Visit: Payer: Self-pay | Admitting: Internal Medicine

## 2011-01-25 LAB — POCT I-STAT, CHEM 8
BUN: 16 mg/dL (ref 6–23)
Calcium, Ion: 1.17 mmol/L (ref 1.12–1.32)
TCO2: 30 mmol/L (ref 0–100)

## 2011-01-31 LAB — GLUCOSE, CAPILLARY: Glucose-Capillary: 88 mg/dL (ref 70–99)

## 2011-02-03 ENCOUNTER — Ambulatory Visit (HOSPITAL_COMMUNITY)
Admission: RE | Admit: 2011-02-03 | Discharge: 2011-02-03 | Disposition: A | Discharge status: Home / Self Care | Payer: PRIVATE HEALTH INSURANCE | Source: Ambulatory Visit | Attending: Internal Medicine | Admitting: Internal Medicine

## 2011-02-03 DIAGNOSIS — Z1231 Encounter for screening mammogram for malignant neoplasm of breast: Secondary | ICD-10-CM | POA: Insufficient documentation

## 2011-02-12 ENCOUNTER — Other Ambulatory Visit: Payer: Self-pay | Admitting: *Deleted

## 2011-02-12 MED ORDER — METFORMIN HCL 1000 MG PO TABS: 1000.0000 mg | ORAL_TABLET | Freq: Two times a day (BID) | ORAL | Status: DC

## 2011-02-19 ENCOUNTER — Other Ambulatory Visit: Payer: Self-pay | Admitting: Internal Medicine

## 2011-02-25 MED ORDER — DIAZEPAM 5 MG PO TABS: 5.0000 mg | ORAL_TABLET | Freq: Three times a day (TID) | ORAL | Status: DC | PRN

## 2011-02-25 NOTE — Telephone Encounter (Signed)
Front desk - pls sch appt with Dr Phillips Odor first available. Thanks

## 2011-03-09 NOTE — Procedures (Signed)
NAME:  [name], [name]                 ACCOUNT NO.:  [id number]   MEDICAL RECORD NO.:  [id number]          PATIENT TYPE:  OUT   LOCATION:  SLEEP CENTER                 FACILITY:  [facility]   PHYSICIAN:  Clinton D. Maple Hudson, MD, FCCP, FACPDATE OF BIRTH:  October 28, 1949   DATE OF STUDY:  09/10/2008                            NOCTURNAL POLYSOMNOGRAM   REFERRING PHYSICIAN:   INDICATION FOR STUDY:  Hypersomnia with sleep apnea.   EPWORTH SLEEPINESS SCORE:  Epworth sleepiness score 3/24.  BMI 32.9.  Weight 186 pounds.  Height 63 inches.  Neck 16 inches.   MEDICATIONS:  Home medications are charted and reviewed.   An initial diagnostic study was done on 12/21/2003 with AHI 44 per hour.  Weight was 192 pounds.  CPAP titration was done on 02/26/2004 to a  recommended CPAP of 17 CWP, RDI 2.4 per hour.  CPAP titration is not  requested.   SLEEP ARCHITECTURE:  Total sleep time, 323 minutes with sleep efficiency  72%.  Stage I was 6.8%. Stage II 81.7%.  Stage III 0.3%.  REM 11.1% of  total sleep time.  Sleep latency 58 minutes.  REM latency 175 minutes.  Wake after sleep onset, 67.5 minutes.  Arousal index 19.3.  Bedtime  medications were metformin and trazodone.   RESPIRATORY DATA:  CPAP titration protocol.  CPAP was titrated to 20  CWP, AHI 0 per hour.  She chose a medium ResMed Quattro full face mask  with heated humidifier.   OXYGEN DATA:  Before CPAP control, snoring was moderately loud with  oxygen desaturation to a nadir of 90%.  With CPAP, mean oxygen  saturation through the study was 96.1% on room air.   CARDIAC DATA:  Normal sinus rhythm.   MOVEMENT-PARASOMNIA:  No movement disturbance.  Bathroom x1.   IMPRESSIONS-RECOMMENDATIONS:  1. Successful CPAP titration to 20 CWP, AHI 0 per hour.  She chose a      medium ResMed Quattro full face mask with heated humidifier.  2. Original baseline diagnostic NPSG on 12/21/2003 had recorded an RDI      (AHI) of 44 per hour.  She weighed 192 pounds.   CPAP was titrated      on 02/26/2004 to 17 CWP, RDI 2.4 per hour.      Clinton D. Maple Hudson, MD, [facility], FACP  Diplomate, Biomedical engineer of Sleep Medicine  Electronically Signed     CDY/MEDQ  D:  09/14/2008 14:47:49  T:  09/15/2008 00:49:32  Job:  [id number]

## 2011-03-10 ENCOUNTER — Ambulatory Visit: Payer: Medicare Other | Admitting: Internal Medicine

## 2011-03-10 ENCOUNTER — Other Ambulatory Visit: Payer: Self-pay | Admitting: Cardiovascular Disease

## 2011-03-11 ENCOUNTER — Ambulatory Visit (INDEPENDENT_AMBULATORY_CARE_PROVIDER_SITE_OTHER): Payer: PRIVATE HEALTH INSURANCE | Admitting: Internal Medicine

## 2011-03-11 ENCOUNTER — Encounter: Payer: Self-pay | Admitting: Internal Medicine

## 2011-03-11 ENCOUNTER — Ambulatory Visit: Payer: Self-pay | Admitting: Internal Medicine

## 2011-03-11 ENCOUNTER — Ambulatory Visit (HOSPITAL_COMMUNITY)
Admission: RE | Admit: 2011-03-11 | Discharge: 2011-03-11 | Disposition: A | Discharge status: Home / Self Care | Payer: PRIVATE HEALTH INSURANCE | Source: Ambulatory Visit | Attending: Internal Medicine | Admitting: Internal Medicine

## 2011-03-11 VITALS — BP 124/67 | HR 77 | Temp 97.9°F | Ht 64.75 in | Wt 200.1 lb

## 2011-03-11 DIAGNOSIS — D649 Anemia, unspecified: Secondary | ICD-10-CM

## 2011-03-11 DIAGNOSIS — G8929 Other chronic pain: Secondary | ICD-10-CM

## 2011-03-11 DIAGNOSIS — M549 Dorsalgia, unspecified: Secondary | ICD-10-CM

## 2011-03-11 DIAGNOSIS — M899 Disorder of bone, unspecified: Secondary | ICD-10-CM | POA: Insufficient documentation

## 2011-03-11 DIAGNOSIS — M79609 Pain in unspecified limb: Secondary | ICD-10-CM | POA: Insufficient documentation

## 2011-03-11 DIAGNOSIS — E119 Type 2 diabetes mellitus without complications: Secondary | ICD-10-CM

## 2011-03-11 DIAGNOSIS — E785 Hyperlipidemia, unspecified: Secondary | ICD-10-CM

## 2011-03-11 LAB — CBC
HCT: 36.6 % (ref 36.0–46.0)
Hemoglobin: 11.1 g/dL — ABNORMAL LOW (ref 12.0–15.0)
MCH: 23.4 pg — ABNORMAL LOW (ref 26.0–34.0)
MCV: 77.1 fL — ABNORMAL LOW (ref 78.0–100.0)
Platelets: 256 10*3/uL (ref 150–400)
RBC: 4.75 MIL/uL (ref 3.87–5.11)
RDW: 18.5 % — ABNORMAL HIGH (ref 11.5–15.5)
WBC: 9 10*3/uL (ref 4.0–10.5)

## 2011-03-11 LAB — GLUCOSE, CAPILLARY: Glucose-Capillary: 121 mg/dL — ABNORMAL HIGH (ref 70–99)

## 2011-03-11 LAB — POCT GLYCOSYLATED HEMOGLOBIN (HGB A1C): Hemoglobin A1C: 6.6

## 2011-03-11 MED ORDER — GABAPENTIN 800 MG PO TABS: 800.0000 mg | ORAL_TABLET | Freq: Three times a day (TID) | ORAL | Status: DC

## 2011-03-11 MED ORDER — METFORMIN HCL 1000 MG PO TABS: 1000.0000 mg | ORAL_TABLET | Freq: Two times a day (BID) | ORAL | Status: DC

## 2011-03-11 MED ORDER — DIAZEPAM 5 MG PO TABS: 5.0000 mg | ORAL_TABLET | Freq: Three times a day (TID) | ORAL | Status: DC | PRN

## 2011-03-11 MED ORDER — OXYCODONE-ACETAMINOPHEN 10-325 MG PO TABS: 1.0000 | ORAL_TABLET | Freq: Two times a day (BID) | ORAL | Status: DC | PRN

## 2011-03-11 NOTE — Progress Notes (Signed)
  Subjective:    Patient ID: [name], female    DOB: 08-28-1951, 60 y.o.   MRN: [id number]  HPI Chart, medication list reviewed with patient and medications updated. She has a number of muskuloslelatal concerns today, as outlined above --some pain in MCP at index finger with aching in hand as a result --low back pain-has mild djd by MRI scan at L5-S1 interface --bilateral foot pain. Went to the podiatrist who advised her "said I had lots of fractures in my feet and some arthritis too"    When describing her sx's, pain is diffuse, primarily on planter surfaces, bilateral, worst with weight bearing. --DM-2 with A1C reflecting good control. No sx's of angina, claudication, soft tissue or skin infections, visual difficulties --Peripheral neuropathy --Depression. Had her first session with her counselor yesterday, and remains on her antidepressants and valium   Review of Systems As above    Objective:   Physical Exam  Constitutional:       Appears well, NAD  HENT:  Head: Normocephalic and atraumatic.  Mouth/Throat: Oropharynx is clear and moist.  Eyes: Pupils are equal, round, and reactive to light.  Neck: Normal range of motion. Neck supple. No thyromegaly present.  Cardiovascular: Normal rate, normal heart sounds and intact distal pulses.  Exam reveals no gallop.   No murmur heard. Pulmonary/Chest: Breath sounds normal.  Musculoskeletal:       Arms:      Feet:  Lymphadenopathy:    She has no cervical adenopathy.          Assessment & Plan:  By problem as well  On reviewing her films, her joint pains and back pain, while very significant for her, seem exacerbated by deconditioning and obesity We talked about the need for physical activity and exercise-if needed, non-weight-bearing. She has free gym membership available to her (has medicare advantage) and I advised her to follow up on this and explore non-weight bearing activity--"because you are not used to this you  will feel worse and hurt a bit more when you start exercise, but this is something you can work through"  Continue on antidepressants and counseling I advised her it might be helpful to get her off the valium and percocet in a gradual manner in the next several months, since these can have side effects of sedation.  DM-2 in good control-no change in therapy  Stop the indocin  Continue her antihypertensives-in excellent control today  She is understandably very concerned about her feet, having heard of the "fractures" (not clear of the actual conversation that took place) but as a precaution we will obtain films of bilateral feet today

## 2011-03-12 LAB — COMPLETE METABOLIC PANEL WITH GFR
ALT: 12 U/L (ref 0–35)
AST: 15 U/L (ref 0–37)
Albumin: 4.2 g/dL (ref 3.5–5.2)
Alkaline Phosphatase: 68 U/L (ref 39–117)
BUN: 24 mg/dL — ABNORMAL HIGH (ref 6–23)
CO2: 23 mEq/L (ref 19–32)
Calcium: 9.5 mg/dL (ref 8.4–10.5)
Chloride: 105 mEq/L (ref 96–112)
Creat: 1.52 mg/dL — ABNORMAL HIGH (ref 0.40–1.20)
GFR, Est African American: 42 mL/min — ABNORMAL LOW (ref >60)
Potassium: 5.4 mEq/L — ABNORMAL HIGH (ref 3.5–5.3)
Sodium: 140 mEq/L (ref 135–145)
Total Bilirubin: 0.2 mg/dL — ABNORMAL LOW (ref 0.3–1.2)
Total Protein: 7.6 g/dL (ref 6.0–8.3)

## 2011-03-12 NOTE — Consult Note (Signed)
[place]  Patient:    [name], [name] Visit Number: [id number] MRN: [id number]          Service Type: MED Location: 2000 2027 01 Attending Physician:  Madaline Guthrie Dictated by:   Florencia Reasons, M.D. Proc. Date: 12/22/01 Admit Date:  12/17/2001   CC:         Marene Lenz, M.D.   Consultation Report  DATE OF BIRTH:  28-Oct-1950.  REASON FOR CONSULTATION:  The Internal Medicine Teaching Service asked me to see this 60 year old African-American female because of chronic nausea, occasional vomiting, and a history of heme-positive stool.  HISTORY OF PRESENT ILLNESS:  The patient was admitted to the hospital about five days ago with generalized failure to thrive as well as some superimposed abdominal pain, chronic nausea worse over the past two weeks, and a history of Hemoccult positive stool for which an outpatient colonoscopy had been arranged for yesterday with Dr. Cheryln Manly (whom she has never seen), but that appointment got cancelled in view of her hospitalization. The patient has been treated in house empirically for diverticulitis with Cipro and Flagyl. She was treated with Reglan as an outpatient, apparently without any significant improvement, but does not recall being on any PPIs as an outpatient (she was apparently on Zantac 150 b.i.d.), nor is she on ulcerogenic medications. There is no history of peptic ulcer disease.  Because of the patients ongoing symptoms, endoscopic evaluation was felt to be warranted. An ultrasound, a CT scan, and an gastric emptying scan all obtained on this admission have been unrevealing and basically within normal limits.  The patient states that vomiting is infrequent but has occurred since coming into the hospital, not previously. Her pain is rather diffuse and nonspecific in character. By the patients report, she has gained a large amount of weight over the past six months despite her  symptoms; on the other hand, the patient has supposedly had a 20 pound weight loss since coming into the hospital; her weight several days ago was reported as being 213 and today it is 193 pounds. Obviously, the accuracy of the scales is somewhat uncertain.  PAST MEDICAL HISTORY:  ALLERGIES:  PENICILLIN, TAGAMET, and SULFA DRUGS.  OUTPATIENT MEDICATIONS PRIOR TO ADMISSION: 1. Neurontin. 2. Glucovance. 3. NPH insulin. 4. Altace. 5. Zantac. 6. Elavil. 7. Clonidine. 8. Reglan 5 mg a.c. and h.s.  CURRENT MEDICATIONS IN THE HOSPITAL: 1. IV Reglan. 2. Clonidine 3. Insulin as above. 4. IV Pepcid. 5. IV Cipro. 6. IV Flagyl. 7. Zofran p.r.n.  PAST SURGICAL HISTORY: Total abdominal hysterectomy for intractable pelvic pain per chart.  MEDICAL ILLNESSES: 1. Type 2 diabetes diagnosed four years ago. 2. Hypertension. 3. History of recent Hemoccult-positive stools. 4. GERD. 5. Fibromyalgia. 6. Depression. 7. Possible bipolar disorder. 8. Chronic nausea. 9. History of lower extremity edema with normal cardiac ejection fraction. No    known cardiac disease, COPD.  SOCIAL HISTORY:  Habits:  The patient does smoke and has occasional ethanol. The patient lives with her 30-year-old grandson. She previously worked as a Designer, jewellery and a Lawyer.  FAMILY HISTORY:  Negative for GI tract illnesses such as colon cancer, colitis, liver disease, ulcers, gallstones.  REVIEW OF SYSTEMS:  GASTROINTESTINAL:  Symptoms which the patient has experienced frequently include dysphagia to pills and sometimes to food with what sounds like transient food impactions, chronic heartburn/reflux/ regurgitation symptoms, recurring nonspecific abdominal pain and nausea per History of Present Illness. On further questioning, however, the  abdominal pain really is sort of around her sides and into the back, although she thinks it originates in her abdomen. It does not sound like a classic  "bellyache" but there is pain in the abdominal area. No lower tract symptomology such as constipation, diarrhea, or rectal bleeding.  PHYSICAL EXAMINATION:  VITAL SIGNS:  Blood pressure 160/82, afebrile, pulse around 88.  HEENT:  Anicteric. No evidence pallor.  CHEST:  Clear.  HEART:  Normal.  ABDOMEN:  Obese but without overt organomegaly, guarding, or mass. There is some semireproducible right upper quadrant tenderness.  RECTAL:  Heme-negative stool on admission and was not repeated. She was Hemoccult positive as an outpatient.  LABORATORY DATA:  Admission white count 96,000, hemoglobin 13.3 with MCV of 77 and a RDW of 14.7, platelets normal at 233,000. Following hydration, hemoglobin is just dropped slightly to 12.7. Metabolic panel currently normal except for potassium in the 3.1 to 3.2 range and glucose running between 640 on admission and more recently 186. Renal function normal. Liver chemistries essentially normal except for minimal elevation of AST and ALT on admission (41 and 53, respectively) with total bilirubin of 1.7 and normal alk phos of 103. Recheck the next day showed AST 39, ALT 34, total bilirubin 0.9, lipase normal at 42.  IMPRESSION: 1. Chronic nausea, worsened recently. 2. Transiently Hemoccult-positive stool without anemia but with microcytic    indices. 3. Abdominal pain, dysphagia, reflux symptoms. 4. History of depression/?bipolar disorder, diabetes, hypertension,    fibromyalgia.  DISCUSSION:  I wonder about bile reflux gastritis. Even with a normal gastric emptying scan, I still think that this is a possibility. It would be compatible with the negative other studies so far such as her CT scan and ultrasound.  PLAN:  Proceed to endoscopic evaluation, the nature purpose and risks of which have been reviewed with the patient and she is agreeable to proceed. Further management to depend on the endoscopic findings.  Dictated by:   Florencia Reasons, M.D. Attending Physician:  Madaline Guthrie DD:  12/22/01 TD:  12/22/01 Job: [id number]

## 2011-03-12 NOTE — Assessment & Plan Note (Signed)
REASON FOR VISIT:  Ms. Emley returns after last appointment on January 07, 2004.  She has painful diabetic neuropathy as well as lumbar myofascial pain  syndrome.   PAST MEDICAL HISTORY:  Significant for depressive disorder.   HISTORY OF PRESENT ILLNESS:  No new medications.  We did increase her  tramadol to 2 tablets q.i.d.  Her sleep is still an issue.   ALLERGIES:  1. PENICILLIN.  2. TAGAMET.  3. SULFA.   MEDICATIONS:  Metformin, metoclopramide, Lexapro, Lithium, Insulin,  Nortriptyline, Lidoderm, Lasix,  aspirin.   REVIEW OF SYSTEMS:  No bowel or bladder complaints.  She has had back pain  radiating to the feet.   SOCIAL HISTORY:  Smokes pack and a half a day.   PHYSICAL EXAMINATION:  VITAL SIGNS:  Blood pressure 144/91, pulse 69, O2  saturation 95% on room air.  BACK:  Mild tenderness to palpation at L5-S1 junction.  She has poor  flexion, approximately 50% extension, 50% lateral rotation, bending 50%.  She has reduced sensation in the feet compared to the knees to light touch.  She is able to distinguish which toes touched.  However, she has diminished  deep tendon reflexes 1/4 symmetric bilaterally.  NEUROLOGICAL:  Gait is without evidence toward right knee instability.  She  is able to toe-walk and heel-walk very short distances.  Motor strength is  5/5 bilaterally, deltoid, biceps, triceps, grip as well as hip flexion, knee  extension, ankle dorsiflexion.   IMPRESSION:  1. Painful diabetic neuropathy, chronic lower extremity pain and     paresthesias.  2. Insomnia, somewhat improved after Nortriptyline.  I think she can benefit     from increased dose.  3. Myofascial lumbar pain. I do not think she has radiculopathy.  At this     point, although, it would be difficult to differentiate with her     underlying peripheral neuropathy.   PLAN:  1. Increase nortriptyline to 20 q.h.s.  2. Continue tramadol 2 q.i.d.  3. Lumbar support with i.e. micro-Z with stem support  trial.  4. I will see her in one month to follow on above.  5. Consider lumbar epidural.      Erick Colace, M.D.   AEK/MedQ  D:  02/04/2004 12:50:12  T:  02/04/2004 14:16:48  Job #:  [id number]

## 2011-03-12 NOTE — Discharge Summary (Signed)
NAMEBARBA, SOLT                             ACCOUNT NO.:  [id number]   MEDICAL RECORD NO.:  [id number]                   PATIENT TYPE:  OUT   LOCATION:  XRAY                                 FACILITY:  [facility]   PHYSICIAN:  Lonia Blood, M.D.                   DATE OF BIRTH:  [birth year]   DATE OF ADMISSION:  12/17/2001  DATE OF DISCHARGE:  12/25/2001                                 DISCHARGE SUMMARY   DISCHARGE DIAGNOSES:  1. Esophageal ring, widely dilated.  2. Gastroesophageal reflux disease.  3. Diabetes mellitus type 2 diagnosed four years ago.  4. Hypertension.  5. History of heme positive stools.  6. History of fibromyalgia.  7. Chronic nausea and vomiting.  8. Depression followed by Dr. Raylene Miyamoto, questionable bipolar disorder.  9. History of total abdominal hysterectomy.  10.      Chronic lower extremity edema with normal left ventricular systolic     function and an ejection fraction of 55 to 65%.  11.      History of shoulder pain, possible bone spur.  12.      Obesity with prior history of enlarged right parotid gland with a     CT of the neck showing possible parotid gland enlargement.   DISCHARGE MEDICATIONS:  1. Reglan 5 mg q.a.c. and q.h.s.  2. Clonidine 0.1 mg twice a day.  3. Protonix 40 mg q.d.  4. Carafate 1 gram q.i.d.  5. NPH insulin - 8 units in the morning and 15 units in the evening.   DISPOSITION AND FOLLOW-UP:  1. The patient was discharged to follow-up with Dr. Matthias Hughs in his office on     January 26, 2002 at 11:15 a.m.  2. Follow-up with [facility] outpatient clinic on February 06, 2002.   PROCEDURES PERFORMED:  1. Gastric emptying study performed on December 21, 2001 to rule out     diabetic gastroparesis that shows normal solid filled gastric emptying.  2. Chest x-ray, two views, performed on December 17, 2001 that was normal.  3. CT of the pelvis and abdomen with and without contrast media performed on     December 18, 2001 that showed a  fatty liver with minimal bibasilar     atelectasis and otherwise no active abnormalities.  Also shows evidence     of an abdominal hysterectomy.  4. Ultrasound of the abdomen complete performed on December 18, 2001 that     also shows the fatty infiltration of the liver but otherwise negative.  5. Esophagogastroduodenoscopy performed on December 22, 2001 by Dr. Matthias Hughs     that showed a widely patent esophageal ring, moderate bilious residual     about 100 cc that may have accounted for the patient's recurrent nausea.     No antral contractility seen.  No other abnormalities.  No dilatation     required at this point.  CONSULTATIONS:  1. Gastroenterology, Dr. Matthias Hughs.   BRIEF HISTORY AND PHYSICAL:  The patient is a 60 year-old African-American  woman with a past medical history significant for diabetes, hypertension,  gastroesophageal reflux disease, obesity, fibromyalgia, depression, bipolar  disorder and recent lower extremity edema.  She presented to the emergency  department complaining of abdominal pain for about a day which was diffuse  and radiated around to her back.  It was associated with nausea, however,  she was not vomiting.  The patient has had decreased intake for two days  prior to coming to the emergency room, but had no associated diarrhea,  constipation, melena or bright red blood per rectum.  She also denied  hematemesis.  The patient had a mildly productive cough at that point. Her  clinical exam showed a temperature of 98.5, blood pressure 165/102, pulse  121, respiratory rate of 20 and saturation of 98% on room air.  She had an  enlarged prominent parotid gland on the right with negative JVD.  Her  abdomen was also obese, soft and diffusely tender to  palpation with no  rebound, guarding or palpable masses.  Her rectal exam showed heme negative  stool with good rectal tone.   ADMISSION LABORATORIES:  White count 9.6, hemoglobin 13.3, platelets 233,000  and an  MCV of 76.8.  Sodium 128, potassium 5.5, chloride 95, bicarbonate 14,  BUN 23, creatinine 1.5 and glucose of 532 with an anion gap of 19.  She had  a total bilirubin of 1.7, alkaline phosphatase 103, AST elevated at 241, ALT  53, total protein 9.0, albumin 4.0 and calcium 10.5.  Her UA showed  glucosuria of 1000, ketones 40, trace blood, 100 protein.  Lipase was 42.  Serum osmolality 305.   Her EKG showed normal sinus rhythm at 94 beats per minute and no active  changes.  Chest x-ray also shows no active changes.   HOSPITAL COURSE:  1. ABDOMINAL PAIN, NAUSEA AND VOMITING:  This was assessed initially as     possibly caused by diabetic ketoacidosis with the patient's anion gap of     19 and hyperglycemia.  Her serum osmolality was checked and was 305 with     no neurological findings.  The patient was therefore treated initially     with IV fluids and by the following day IV insulin as well as potassium     more like a diabetic ketoacidosis patient.  The patient's CBG improved     and even normalized within the first day of admission. However, her     abdominal pain continued.  Several studies were performed and were all     negative including gastric motility series as shown above.  Ultimately,     an ultrasound was also performed which was negative.  GI was therefore     consulted to evaluate the patient's constant abdominal pain and an EGD     was performed as indicated above.  The patient was subsequently     discharged on Carafate, continued protein pump inhibitor and for possible     outpatient colonoscopy per GI.  2. NON-KETOTIC ACIDOSIS:  As mentioned above the patient came in with     hyperglycemia, a prolonged hyperosmolar and acidosis.  The patient was     placed on IV fluids and insulin drip initially and then converted to     subcutaneous insulin. Her CBG's normalized and the patient did not have     any neurological findings as  she did very well prior to discharge. 3.  DIABETES:  The patient is a known diabetic with a hemoglobin A1c of 8.2     in January 2003.  During the course of this hospitalization she was     managed appropriately on NPH insulin as well as sliding scale insulin.     Her CBG's initially were in the range of 100 to 300's, but at the time of     discharge her CBG's have trended downward with the highest being 150 for     the most part.  4. ANEMIA:  While in the hospital the patient started dropping her     hemoglobin which was attributed to possibly her aggressive hydration as     well as her previous history of heme positive stools suggesting a GI     source.  With her abdominal pain above, the nausea and vomiting that     played out and the EGD findings,  the patient was eventually treated     empirically for diverticulitis since she did have a history of     diverticular disease.  She was given some antibiotics which helped     relieve her symptoms.  Meanwhile, her anemia was thought to be attributed     to a probable GI bleed although she remained heme negative for the most     part of this admission.   DISCHARGE LABORATORIES:  On the day of discharge the patient's laboratories  were as follows:  White count 7.9, hemoglobin 11.2, platelets 231,000.  Sodium 141, potassium 3.9, chloride 108, C02 25, BUN 5, creatinine 1.0,  glucose 250, calcium 8.8.                                                Lonia Blood, M.D.    Verlin Grills  D:  07/22/2002  T:  07/24/2002  Job:  [id number]   cc:   Florencia Reasons, M.D.  [address]  [place], [place] [address]  Fax: [phone]   Celine Mans

## 2011-03-12 NOTE — Discharge Summary (Signed)
NAMEKEITH, [name] NO.:  [id number]   MEDICAL RECORD NO.:  [id number]                   PATIENT TYPE:  INP   LOCATION:  5016                                 FACILITY:  [facility]   PHYSICIAN:  Alvester Morin, M.D.               DATE OF BIRTH:  1951-06-29   DATE OF ADMISSION:  07/26/2002  DATE OF DISCHARGE:  07/28/2002                                 DISCHARGE SUMMARY   DISCHARGE DIAGNOSES:  1. Hyperglycemia.  2. Hyperkalemia.  3. Hypertension.  4. Vaginal discharge.  5. Acute renal failure.  6. Gastroesophageal reflux disease.  7. Depression.  8. Tobacco use.   DISCHARGE MEDICATIONS:  1. Altace 5 mg one tablet p.o. q.d.  2. Clonidine 0.1 mg one tablet p.o. b.i.d.  3. Avandia 2 mg one tablet p.o. q.d.  4. Glucophage 1,000 mg one tablet p.o. b.i.d.  5. Elavil 50 mg one tablet p.o. q.h.s.  6. Insulin 70/30 30 units in the morning and 10 units in the evening subcu.  7. Prozac one pill p.o. b.i.d.   DISCHARGE INSTRUCTIONS:  The patient was instructed to check her capillary  blood glucose before breakfast, before dinner, and before bed. She is to  report these values and bring them to her next appointment.   FOLLOW UP:  The patient will be called by the internal medicine outpatient  clinic for a date and time for her next appointment. She will have blood  work done in the clinic to check her hemoglobin.   DIET:  The patient was to participate in a diabetic diet as discussed with  hospital staff.   CONSULTANTS:  Diabetic teaching.   HISTORY OF PRESENT ILLNESS:  [name] is a 60 year old female with a  history of diabetes mellitus type 2. The patient had noted that her hands  had become clumsy over the past few days prior to admission and that she was  dropping things. She also noticed that she was unable to walk in a straight  line, bumping into walls, and was feeling lightheaded and dizzy. She  complained of polyuria and polydipsia. She  stated that she had not eaten  very many solid foods because all she was wanted to do was drink. She felt  that she had a yeast infection. The patient denied temperature, chills,  nausea or vomiting, diarrhea, cough, or dysuria.   PHYSICAL EXAMINATION:  GENERAL:  Pertinent physical exam findings were a  temperature of 96.3, blood pressure of 183/81, heart rate of 117.  LUNGS:  Clear on examination.  ABDOMEN:  Obese, good bowel sounds, and suprapubic tenderness to palpation,  no rebound or guarding.  NEUROLOGICAL:  The patient was alert and oriented. No focal deficits or  confusion noted on neurologic exam; however, the patient's affect was flat.   LABORATORY DATA:  Included a sodium of 116, corrected for glucose was 132.  Glucose  level was 1,123. Potassium 6.1, BUN 26, creatinine 1.6, white blood  cell count 9.4, bicarb 22, anion gap 15. Chest x-ray showed no acute  disease. ECG showed no peak T waves but sinus tachycardia at 107. Otherwise,  no ischemic changes.   HOSPITAL COURSE:  The patient was admitted for evaluation and treatment of  her hyperglycemia.   1. Hyperglycemia, believed to be due to patient's noncompliance with her     diet and medication which she readily admitted to. The patient had not     taken her insulin in many days, nor has she been checking her capillary     glucose. Hyperglycemia resolved with IV fluids and insulin     administration.  2. Hyperkalemia. Potassium was initially 6.1 and resolved with insulin     administration.  3. Hypertension. The patient was orthostatic upon arrival to the emergency     department. This resolved with IV fluid administration.  4. Vaginal discharge. Wet prep showed positive clue cells and positive     yeast. The patient was given one time treatment with both metronidazole     and Diflucan.  5. Acute renal failure. Creatinine was initially elevated at 1.6. This was     prerenal in nature most likely. The creatinine resolved  to normal limits     with hydration.  6. Gastroesophageal reflux disease was stable throughout admission.  7. Depression. The patient was concerned about her depression being     inadequately controlled. She is currently under the close care of her     psychotherapist as well as psychiatrist and is following up on a weekly     basis.  8. Tobacco use. The patient was encouraged to quit smoking. Of note, the     patient had decreased her smoking amount prior to admission and states     that she will continue to do so after discharge.  9. Medication noncompliance. The patient was heavily counseled about the     importance of following her diabetic diet as well as taking all of her     medications as prescribed. The patient voiced good understanding and     appropriate insight. She states that she will follow closely with Dr.     Jenne Pane, her primary care physician, to encourage her for more consistency     in using her medications.   DISCHARGE LABORATORY DATA:  Sodium 143, potassium 3.4, chloride 105, bicarb  21, BUN 12, creatinine 1.0, glucose 294. White count 6.6, hemoglobin 9.6,  platelets 182.      Barney Drain, M.D.                       Alvester Morin, M.D.    SS/MEDQ  D:  08/01/2002  T:  08/04/2002  Job:  [id number]   cc:   Marene Lenz, D.O.  [facility]  [place], [place] [address]  Fax: [phone]   Internal Medicine Outpatient Clinic

## 2011-03-12 NOTE — Discharge Summary (Signed)
NAMEKALEYAH, LABRECK NO.:  [id number]   MEDICAL RECORD NO.:  [id number]          PATIENT TYPE:  INP   LOCATION:  5743                         FACILITY:  [facility]   PHYSICIAN:  Edsel Petrin, D.O.DATE OF BIRTH:  [birth year]   DATE OF ADMISSION:  06/10/2007  DATE OF DISCHARGE:  06/13/2007                               DISCHARGE SUMMARY   ATTENDING PHYSICIAN:  Alvester Morin, M.D.   DISCHARGE DIAGNOSES:  1. Diabetic ketoacidosis secondary to medication nonadherence.  2. Prolonged QT, secondary to hypokalemia.  3. Major depressive disorder.  4. Gastroesophageal reflux disease.  5. History of suicide attempt in 2004.  6. Borderline personality disorder, followed by [facility].  7. History of obstructive sleep apnea.   DISCHARGE MEDICATIONS:  1. NovoLog 70/30 mix, inject 37 units in the morning and 26 units in      the evening.  2. Protonix 40 mg p.o. daily.  3. Lexapro 20 mg 1 tablet p.o. daily.  4. Wellbutrin 300 mg take 1 tablet daily.  5. Glucophage 1000 mg 1 tablet p.o. twice a day.  6. Trazodone 150 mg 1 tablet nightly.   DISPOSITION AND FOLLOW-UP:  Ms. Behe is being discharged from the  hospital in stable and improved condition.  She has an appointment with  Dr. Madelaine Etienne in the outpatient clinic on June 29, 2007, at 8:45.  She also has an appointment with [facility] on  July 05, 2007.  At the time of follow-up, the most important thing  will be to make sure that Ms. Rochford is being adherent with our  recommendations for control of her diabetes and for her depression.  This is going to require a multidisciplinary approach, both from her  mental health providers, clinic providers and her home support system.  She will need to bring in, at follow-up, her insulin readings and in  addition, she will need to be set up with [facility] again for diabetes  education.   BRIEF ADMISSION HISTORY AND PHYSICAL:  Ms. Cubbage is  a 60 year old woman  who I have seen many times in my clinic with a history of poorly  controlled type 2 diabetes, recently requiring insulin, who has also  severe depression which has interfered with her motivation and  willingness to take her insulin and other medications as recommended by  her health care providers.  She stopped taking her medications about six  weeks prior to this admission.  She was feeling fine by her report both  in clinic over the past few weeks and reports that she felt this way  until about two days ago when she developed severe nausea, vomiting,  polyuria, blurry vision and some polyneuropathy.  She came to the  emergency department after feeling severe dizziness and her husband  insisted that she come, with some alteration of her mental status and  some chest tightness.  She denied that this was a passive suicide  attempt, however, this had been the end outcome given how she had  presented in clinic prior to this hospitalization.  She reported  feeling  very guilty and was very tearful, that her healthcare providers had told  her that this would be the outcome of her not taking her insulin.  She  displayed some religiosity in terms of her hopes for healing and her  dislike of taking oral and injectable medicine.   PHYSICAL EXAMINATION:  VITAL SIGNS:  On admission, temperature 98.3,  blood pressure 142/54, pulse 89, respiratory rate 16, O2 sats 98% on  room air.  GENERAL APPEARANCE:  On admission, she was arousable and responsive but  lethargic.  HEART:  Regular rate and rhythm.  LUNGS:  Clear.  ABDOMEN:  Soft and nontender, but she had hypoactive bowel sounds.  PSYCHIATRIC:  She was not delusional.  She denied any suicidal or  homicidal ideation.  She was not hallucinating.  Her thought patterns  were normal.   ADMISSION LABORATORIES:  First BMET with sodium 126, potassium 4.5,  chloride 95, bicarb 7, BUN 33, creatinine 2.03, glucose 606.  Arterial  blood  gas with pH 7.23, pCO2 22.9, pO2 104, bicarb 9.3.   Initial EKG showed prolonged QT interval, normal sinus rhythm.   CBC with white blood cells 10.3, hemoglobin 11.9, hematocrit 36.3,  platelets 290, MCV 74.2, RDW 17.6, anion gap 24, bilirubin 2.6, alk phos  162, SGOT 16, SGPT 19, protein 9.3, albumin 3.8, calcium 10.7.   HOSPITAL COURSE BY PROBLEM:  PROBLEM #1 -  DIABETIC KETOACIDOSIS:  Ms.  Mclaren's DKA was precipitated by her nonadherence with an outpatient  insulin and diabetes control regimen.  There are multiple clinic visits  with Ms. Polack in attempts to convince her to take her medicines,  however, due to her severe depression, we were unable to overcome that  barrier.  She was now hospitalized.  She feels some remorse and guilt  for not being adherent with recommendations for healthcare providers.  Her husband is providing support to her as well as a clergy member in  the community.  She also is regularly followed at [facility] for  mental health purposes.  At discharge the patient is stating that she  will be adherent with our recommendations.  In the acute setting, her  DKA was managed with aggressive fluid hydration, careful regulation of  her electrolyte abnormalities including her potassium and her bicarb,  her anion gap closed and was stable after 24 hours of admission.  Her  symptoms of nausea, polyuria and abdominal pain improved over the course  of her hospitalization.  She was successfully transitioned.  She was  initially put on a insulin drip and was successfully transitioned to  subcutaneous insulin.  At discharge, she will be on 70/30 insulin 37 and  26 units morning and evening.  There were no visual electrolyte  abnormalities at discharge.   PROBLEM #2 -  PROLONGED QT INTERVAL:  This was likely precipitated by  changes in her electrolytes.  An EKG obtained prior to discharge showed  correction of this abnormality.   PROBLEM #3 -  CHEST TIGHTNESS/PAIN:   On admission she complained of some  chest pain and tightness.  She was ruled out for a cardiac source  including negative cardiac enzymes, negative EKG for ischemia and normal  echocardiogram.  This was likely related to her initial DKA, dehydration  and additional electrolyte abnormalities and her general state of  deconditioning.  Will monitor her for any changes as an outpatient.   PROBLEM #4 -  SEVERE MAJOR DEPRESSIVE DISORDER:  The patient has been  followed  in the past by [facility]  She does not  take her medications as recommended.  This has been on chronic long-term  problem for her with history of suicide attempt.  During this  hospitalization, she had no acute signs of severe depressive disorder  besides her remorse and guilt that she felt for her health problems  being precipitated by poor judgment and her refusal to follow what  healthcare providers had recommended.  She will need to continue taking  her medications for depression and discharge and have support system and  follow-up with mental health.   DISCHARGE LABORATORY DATA:  Sodium 138, potassium 4, chloride 111,  bicarb 18, BUN 5, creatinine 0.9, glucose 200, calcium 8.6, phosphorus  3, magnesium 1.8.   DISCHARGE VITAL SIGNS:  Temperature 99, blood pressure 135/74, pulse 65,  respiratory rate 20, O2 sats 97% on room air.      Edsel Petrin, D.O.  Electronically Signed     ELG/MEDQ  D:  08/07/2007  T:  08/08/2007  Job:  [id number]

## 2011-03-12 NOTE — H&P (Signed)
NAME:  [name], [name] NO.:  [id number]   MEDICAL RECORD NO.:  [id number]                   PATIENT TYPE:  IPS   LOCATION:  0302                                 FACILITY:  [facility]   PHYSICIAN:  Jeanice Lim, M.D.              DATE OF BIRTH:  [birth year]   DATE OF ADMISSION:  11/06/2002  DATE OF DISCHARGE:                         PSYCHIATRIC ADMISSION ASSESSMENT   IDENTIFYING INFORMATION:  This is a 60 year old African-American female who  is a voluntary admission.   HISTORY OF PRESENT ILLNESS:  This patient was referred by the medicine  outpatient clinic after presenting there with suicidal thoughts and being  unable to contract for safety.  The patient reports that for the past 1-2  weeks, she has had an increase in pain and tingling of her extremities,  along with increased feelings of itching, both in her hands, face and arms,  this in addition to the chronic pain and tingling that she has in her legs  and feet as the result of her diabetic neuropathy.  When she presented in  the clinic and they began talking to her about more tests and the  possibility of taking more medicine, she states that she felt acutely  suicidal.  Apparently in 1999 she overdosed on medications under similar  circumstances when she had felt that she was frustrated with problems with  diabetic neuropathy.  The patient reports that the pain and tingling is  interrupting her sleep at night and that she finds it very difficult to  climb stairs or go about her daily activities.  She sleeps 2-3 hours nightly  at a stretch, frequently awakened by pain and tingling and itching.  She  reports that she is compliant with her medications at home.  She endorses  suicidal thoughts with thoughts of overdosing on her medications.  She  denies any homicidal ideation or auditory or visual hallucinations.  She  denies any social stressors and reports that her relationships and home life  are  excellent.   PAST PSYCHIATRIC HISTORY:  The patient is followed by Dr. Hortencia Pilar at  [facility].  She does have a history of several overdose  attempts, approximately 8-9 in the past, with her most recent one being in  1999 when she overdosed on multiple medications secondary to feelings of  frustration and helplessness related to her medical problems.  She denies  any history of substance abuse/   SOCIAL HISTORY:  The patient has been married, in a stable relationship for  the past 6 years and is happy with her marriage and her home life.  She has  a 12th grade education.  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  She lives at home with her  husband.  She has many  grandchildren and denies any social or financial  stressors.   FAMILY HISTORY:  Remarkable for a brother with a history of schizophrenia  and mother with unclear psychiatric problems.   ALCOHOL AND DRUG HISTORY:  The patient recently stopped smoking cigarettes  in October of 2003.  She reports that she uses alcohol occasionally socially  but denies any other substance abuse or abuse of alcohol.   PAST MEDICAL HISTORY:  The patient is followed by the medicine clinic at  [facility].  Dr. Darnelle Catalan has seen her over in that clinic.  Medical problems  include diabetes mellitus diagnosed approximately 5 years ago, with history  of peripheral neuropathy and gastroparesis, fibromyalgia, GERD, and  hypertension.  Past surgical history is remarkable for hysterectomy in the  past.   MEDICATIONS:  Avandia 2 mg p.o. daily in the morning, Glucophage 1000 mg  p.o. b.i.d., NPH insulin 75/25 34 units q.a.m. and 14 units q.p.m. before  supper, Clonidine 0.1 mg b.i.d. for hypertension, Prilosec 30 mg p.o.  b.i.d., Prozac 40 mg daily, Elavil 100 mg p.o. q.h.s., Neurontin 300 mg  t.i.d.  She was  restarted on this approximately 1 month ago after having  been off of it for some time.  She is not clear that she gets any benefit  from the Neurontin.  Xenical  120 mg p.o. t.i.d., hydrochlorothiazide 25 mg  daily, and Vicodin 5/500 q.6h p.r.n.   DRUG ALLERGIES:  PENICILLIN, TAGAMET, SULFA and possibly VICODIN.  Since she  has now had a dose she reports that it has made her feel nauseated and  generally uncomfortable so we will stop that.   POSITIVE PHYSICAL FINDINGS:  This is a well-nourished, well-developed, obese  female who is fully alert and in no acute distress, although she was drowsy  this morning and actually a bit dizzy.  She is 5 feet 4 inches tall and  weighs 205 pounds.  Temperature 97.3, pulse 84, respirations 20, blood  pressure 161/89.  Her grooming is satisfactory.  HEAD:  Generally normocephalic and atraumatic.  EENT:  PERRLA.  Sclerae are  nonicteric.  SKIN:  Without any notable rashes in spite of her complaints of pruritus.  Full physical examination was done in the emergency room by Dr. Juliene Pina and we will ask internal medicine to also see her today and they  will complete a full physical examination.   LABORATORY DATA:  At the time of admission:  Hemoglobin 10.8, hematocrit  33.2, MCV of 75.8, RDW 16.7, platelets 273,000.  The patient's WBC was 7.7.  Her erythrocyte sedimentation rate was 48.  Her routine chemistry revealed  electrolytes within normal limits.  Her glucose was 199.  Her BUN was 20,  creatinine 1.1.  Her total creatinine kinase was 219.  Her TSH was 0.921.  Rheumatoid factor was less than 20.  Hepatitis B surface antigen was  negative as was her hepatitis C antibody also negative.  The patient has  additional labs pending.   MENTAL STATUS EXAM:  This is a medium build, overweight, African-American  female who is appropriately groomed.  She is a bit drowsy and a little bit dizzy when we stand her up to ambulate today.  She is fully alert,  calm, and  in control.  When she has time to awaken she is cooperative, with a blunted  affect.  Speech is generally normal, it is spontaneous.  Mood is depressed  with a strong sense of hopelessness related to her medical problems and  a  lot of discouragement about the chronic pain that she has been suffering.  Her thought content is dominated by her concerns about her medical condition  and feeling somewhat hopeless that anyone is going to be figure out what is  wrong with her or alleviate her chronic pain.  Thought process is generally  logical and goal directed.  She is positive for suicidal ideation with a  plan to consider overdosing on her medications.  No evidence of homicidal  ideation, no auditory or visual hallucinations.  Cognitively intact and  oriented x3.   ADMISSION DIAGNOSIS:   AXIS I:  Major depressive disorder, recurrent and severe, acute  exacerbation.   AXIS II:  No diagnosis.   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   AXIS IV:  Severe, medical problems.   AXIS V:  Current 28, past year 70.   INITIAL PLAN OF CARE:  Plan is to voluntarily admit the patient to further  evaluate her medical stressors and to alleviate her suicidal ideation.  We  will place her on fall precautions.  We are not going to change her Prozac  at this time since she states that has worked well for her in the past and  she has had good results.  We are going to request an internal medicine  consult to evaluate her pain in the presence of her chronic neuropathy.  Meanwhile we will stop  her Vicodin and offer Darvocet-N 100 q.6h p.r.n. for pain and we will also  check an acetaminophen level on her along with a salicylate level.   ESTIMATED LENGTH OF STAY:  5 days.      Margaret A. Stephannie Peters                   Jeanice Lim, M.D.    MAS/MEDQ  D:  11/06/2002  T:  11/06/2002  Job:   [id number]

## 2011-03-12 NOTE — Assessment & Plan Note (Signed)
HISTORY OF PRESENT ILLNESS:  Ms. Hunkele is being seen today. Former EMG on  July 19, 2003. I last saw her on August 15, 2003. She has diabetic  neuropathy, confirmed by EMG in the past and now with right median  neuropathy at the wrist. Hand pain is not an issue at this visit. She has  current pain at 8 out of 10, mainly lower extremities, less so in the back.  Pain interference score is generally to be 7 out of 10, ___________.   CURRENT MEDICATIONS:  Nortriptyline 10 q.h.s. Gets a little groggy in the  morning when she takes this together with her Trazodone. Tramadol 50 1 p.o.  q.i.d. Has tried capsaicin cream. It causes some burning sensation in her  feet but really, no improvement in her pain.   PAST MEDICAL HISTORY:  Extensive psychiatric history, depressive disorder,  suicidal ideation. Admitted in January of 2004 and denies any  hospitalizations since that time. Vocational disabled since January 2002  combination of diabetes, fibromyalgia, depression, and diabetic neuropathy.   MEDICATIONS:  Include Metformin, Metoclopramide, Lasix, Lexapro, Trazodone,  Nexium, insulin, Tramadol, Nortriptyline, and capsaicin as mentioned above.   SOCIAL HISTORY:  Former smoker.   REVIEW OF SYSTEMS:  No current suicidal ideations. Positive anxiety and  depression. No bowel or bladder incontinence.   PHYSICAL EXAMINATION:  VITAL SIGNS:  Blood pressure 146/79, pulse 85, O2 sat  97% on room air.  NECK:  No tenderness.  BACK:  Thoracic spine without tenderness. No tenderness over the lower back  area.  EXTREMITIES:  She has decreased sensation in the tips of her toes, compared  to the knees bilaterally.   IMPRESSION:  1. Diabetic neuropathy with chronic lower extremity pain and paresthesias.  2. Insomnia, improved with Nortriptyline in combination with Trazodone,     however, gets a little hangover effect. I asked her to take the     Nortriptyline earlier, at least 1 to 2 hours before  bedtime.  3. Continue Tramadol 50 mg but increase night time dose to 100 mg.  4. Lidoderm patch to feet.   FOLLOW UP:  I will see her back in 6 to 8 weeks.      Erick Colace, M.D.   AEK/MedQ  D:  09/27/2003 13:19:07  T:  09/27/2003 14:04:40  Job #:  [id number]

## 2011-03-12 NOTE — Assessment & Plan Note (Signed)
HISTORY:  The patient returns today for followup from an appointment on  November 08, 2003.  EMG confirmed painful diabetic neuropathy.  She has had  increasing pain which she attributes to having recently moved, going up and  down steps.  Her pain in her feet and legs is aggravated by activity.  She  sleeps well now on the Nortriptyline.  She tried a Lidoderm patch to her  feet, which was not particularly helpful for her.   PAST MEDICAL HISTORY:  Significant for depressive disorder.  No current  suicidal ideations.   CURRENT MEDICATIONS:  1. Metformin.  2. Metoclopramide.  3. Lexapro.  4. Trazodone.  5. Nexium.  6. Insulin.  7. Nortriptyline.  8. Capzasin in the past without any good results.  9. Lidoderm patch has been trialed.   REVIEW OF SYSTEMS:  Anxiety, depression.  No interval change in the medical  history.   PHYSICAL EXAMINATION:  GENERAL:  Gait is normal.  Affect is alert.  VITAL SIGNS:  Blood pressure 166/91, pulse 105, O2 saturation 97% on room  air.  NEUROLOGIC:  She has decreased sensation below the knees bilaterally, and  especially at the feet.  She has decreased sensation below the wrists.  She  has good strength in the bilateral upper and lower extremities with manual  muscle testing, at least 5-/5 bilateral deltoid, triceps, biceps, grip, as  well as hip flexion, knee extension and ankle dorsiflexion.   IMPRESSION:  1. Painful diabetic neuropathy, with chronic lower extremity pain and     paresthesias.  2. Insomnia, improved.   PLAN:  1. Increase Tramadol to two tab p.o. q.i.d.  2. I will see her back in one month.  3. Continue Nortriptyline 10 mg q.h.s.      Erick Colace, M.D.   AEK/MedQ  D:  01/07/2004 11:46:16  T:  01/07/2004 16:10:96  Job #:  [id number]   cc:   Dr. Jeanella Craze  [facility]  -  Outpatient Medicine Clinic   Dr. Hortencia Pilar  - Psychiatry

## 2011-03-12 NOTE — Assessment & Plan Note (Signed)
DATE OF BIRTH:  [birth year].   MEDICAL RECORD NUMBER:  [id number].   A 60 year old female last seen by me on May 05, 2004.  She has painful  diabetic nephropathy, chronic lower extremity pain and paresthesias and  lumbar myofascial pain syndrome.  At the last visit, we increased her  nortriptyline 50 mg q.h.s. and tramadol kept at 50 mg two p.o. q.i.d.   Her pain scores have been very good.  She is rated at 0-2/10 and her pain is  mainly at the knees.  Pain improves with rest and medication.  Made worse  with walking, bending and working.   Functionally she is independent with all of her self-care mobility and does  do some regular walking.   Interval history she states is negative.   REVIEW OF SYSTEMS:  Positive for weakness and numbness.  Please see the  review of systems sheet.   PHYSICAL EXAMINATION:  Blood pressure 123/82, pulse 102, respirations 16, O2  saturation 98% on room air.  Affect slow.  Appearance is normal.   No acute distress.   Ambulation is without toe drag or knee instability.  Her lower extremities  have minimal pedal edema.  She has no evidence of joint swelling in the  knees, ankles or feet.  She has full range of motion in the hips, knees and  ankles.  She has no pain to palpation in the hips, knees, ankles or thighs.   The back has 50% forward flexion, 25% extension, 50% lateral bending and 75%  rotation.   Motor strength is 5/5 in bilateral lower extremities.  She has decreased  sensation to pinprick, as well as light touch in the feet compared to the  knees.  Her sensation changes to normal a few centimeters above the ankles.   IMPRESSION:  1.  Painful diabetic retinopathy with chronic lower extremity pain and      paresthesias.  2.  Lumbar myofascial pain.   PLAN:  1.  Continue Ultram 50 mg two p.o. q.i.d.  2.  Continue nortriptyline 50 mg q.h.s.  3.  I will see her back in two months.  No other change is needed at this       time.      Erick Colace, M.D.   AEK/MedQ  D:  07/07/2004 12:15:15  T:  07/07/2004 15:44:29  Job #:  [id number]   cc:   Donald Pore, MD  Internal Medicine Resident - [address]  [place]  [place] [address]  Fax: [phone]

## 2011-03-12 NOTE — Assessment & Plan Note (Signed)
REASON FOR VISIT:  A 60 year old female returns after last appointment February 04, 2004.  Painful diabetic neuropathy, chronic lower extremity pain and  paresthesias, as well as lumbar myofascial pain syndrome.  Past medical  history significant for depressive syndrome.  Allergies to PENICILLIN,  TAGAMET, SULFA.   MEDICATIONS:  1. Metformin.  2. Lexapro.  3. Metoclopramide.  4. Trazodone 50 q.h.s.  5. Nexium.  6. Insulin.  7. Nortriptyline 20 q.h.s.  8. Hydrochlorothiazide.  9. Lasix.  10.      Aspirin.  11.      Tramadol 50 two p.o. q.i.d.   PAIN SCORE:  Pain score 5/10 on average, range 3-8.   REVIEW OF SYSTEMS:  Poor sleep related to pain in the feet.  Some pain in  the shoulders.   EXAMINATION:  VITAL SIGNS:  Blood pressure 133/73, pulse 102, O2 saturation  97% on room air.  GENERAL:  No acute distress, mood and affect appropriate.  MUSCULOSKELETAL:  Gait is normal.  Her back has tenderness to palpation  starting at L4 on down to the sacral area.  No pain over the hips.  Right  trapezius - upper trapezius is sore, and left elbow at the lateral  epicondyle.  She has good range of motion in the lumbar spine.  She has  absent reflexes bilateral lower extremities, no skin changes bilateral lower  extremities, normal range of motion bilateral lower extremities, no pain to  palpation bilateral lower extremities.  Decreased sensation below the knees  bilaterally, as well as below the elbows bilaterally.   IMPRESSION:  1. Painful diabetic neuropathy, chronic lower extremity pain and     paresthesia.  2. Decreased sensation bilateral upper extremities related to above.  3. Insomnia, some improvement after nortriptyline although still not     optimal.  4. Lumbar myofascial pain, would benefit from TENS.   PLAN:  1. Increase nortriptyline to 25 q.h.s.  2. Continue Tramadol two q.i.d.  3. TENS unit - send to PT for above.  4. Follow up in 1-2 months.      Erick Colace, M.D.   AEK/MedQ  D:  03/03/2004 12:04:29  T:  03/03/2004 13:34:13  Job #:  [id number]

## 2011-03-12 NOTE — Procedures (Signed)
[place]. [facility]  Patient:    [name], [name] Visit Number: [id number] MRN: [id number]          Service Type: END Location: ENDO Attending Physician:  Rich Brave Dictated by:   Florencia Reasons, M.D. Proc. Date: 03/01/02 Admit Date:  03/01/2002 Discharge Date: 03/01/2002   CC:         Marene Lenz, M.D., [facility] Staff   Procedure Report  PROCEDURE PERFORMED: Upper endoscopy with Savary dilatation of the esophagus.  INDICATIONS:  This 60 year old female with history of intermittent dysphagia and previously documented mid-esophageal ring.  FINDINGS:  Esophageal dilatation performed to 18 mm.  PROCEDURE:  The nature, purpose, and risks of the procedure had been discussed with the patient who provided written consent.  SEDATION:  Per the nursing record, approximately 75 of fentanyl and 6 of Versed without arrhythmias or desaturation.  PROCEDURE: The Olympus standard video adult endoscope was passed under direct vision.  The vocal cords were normal in appearance.  The esophagus was entered with just mild resistance due to absence of patient swallowing.  The esophageal mucosa was normal except for a mid-esophageal ring at 32 cm, which was quite widely patent.  No reflux esophagitis, Barretts esophagus, varices, infection, or neoplasia were observed.  There was no free reflux.  The stomach contained a small ______ residual, no bile reflux as previously noted.  No gastritis, erosions, ulcers, polyps, or masses were observed.  A retroflexed view of the proximal stomach was unremarkable, and the pylorus, duodenal bulb, and the second duodenum looked normal.  Savary dilatation was then performed in a standard fashion, passing the scope so that its tip was in the antrum of the stomach, inserting the guide wire, removing the scope in an exchange fashion, confirming appropriate positioning of the wire by fluoroscopy,  and then performing a sequential dilatation with Savary dilators, sizes 16 and 18 mm, encountering some extra slight resistance in the distal esophageal region on each occasion.  The guide wire was removed with the larger dilator, and the patient was re-endoscoped under direct vision, which showed some fresh blood in the esophageal lumen.  Primarily, this was emanating from an area of mucosal disruption just below the cricopharyngeus muscle on the right side of the cervical esophagus, without evidence or frank perforation and without any obvious ring or stricture at that location endoscopically.  It also appeared that the mid-esophageal ring at 32 cm had been disrupted, and there was some fresh hemorrhage there.  There was no evidence of profuse or active ongoing bleeding.  The scope was then removed from the patient, who tolerated the procedure without apparent complication.  IMPRESSION: 1. Mid-esophageal ring. 2. Possible narrowing in cervical esophagus as evidenced by dilator-    induced mucosal disruption at that level as well.  PLAN: Clinical followup of dysphagia symptoms.  Proceed to colonoscopy because of previous history of hemepositive stool. Dictated by:   Florencia Reasons, M.D. Attending Physician:  Rich Brave DD:  03/01/02 TD:  03/04/02 Job: [id number]

## 2011-03-12 NOTE — Assessment & Plan Note (Signed)
HISTORY:  [name] is being seen today, she has a EMG confirmed painful  diabetic neuropathy, she had a right median neuropathy at the wrist also but  hand pain is not an issue at this time.  Our last visit on September 27, 2003,  at that time we increased the night-time dose of tramadol.  She continues to  use Lidoderm patch to her feet as well as nortriptyline 10 mg at bedtime and  trazodone 100 mg at bedtime, continues to see psychiatry, she does have  about a 3- to 4-hour hangover affect from her sleep medications.  She does  sleep well and averages about 9 hours per night.   PAST MEDICAL HISTORY:  Psychiatric history for depressive disorder, past  history of suicidal ideation, no new psychiatric hospitalization, has been  stable from that standpoint.   SOCIAL HISTORY:  Vocationally disabled since January 2002 combination of  diabetes, fibromyalgia, depression, diabetic neuropathy.   MEDICATIONS INCLUDE:  Metformin, metoclopramide, Lexapro, trazodone, Nexium,  insulin, nortriptyline, Lidoderm, tramadol and hydrochlorothiazide.   REVIEW OF SYSTEMS:  Anxiety/depression, no suicidal ideation, no changes  compared to the last.   PHYSICAL EXAMINATION:  Gait is normal.  Affect is alert.  She is able to toe  walk and heel walk.  Blood pressure 147/78, pulse 90, respirations 16, O2  saturation 97% room air.   Motor strength is 5/5 bilateral hip flexion, knee extension, ankle  dorsiflexion.   Sensation is reduced to sharp in the toes versus the knees bilaterally,  pulses are diminished bilateral feet pedal and dorsalis pedis, however, feet  are warm, no evidence of skin lesions.  No tenderness to palpation in the  feet or calves.   IMPRESSION:  1. Painful diabetic neuropathy, chronic lower extremity pain and     paresthesias.  2. Insomnia improved with current medication regimen of nortriptyline with     trazodone plus a tramadol increased dose at night.   PLAN:  1. Continue  current medications including Lidoderm patch to feet.  2. I will see her back in 2 months.  If at that time she remains stable on     the current medication regimen would discharge her back to Dr. Jeanella Craze and     I would see her on a p.r.n. basis.  She has been taking her medications     reliably and has not been calling in for any other additional     medications.      [name], M.D.   AEK/MedQ  D:  11/08/2003 09:56:58  T:  11/08/2003 11:24:45  Job #:  [id number]   cc:   Dr. Hortencia Pilar  Psychiatry   Dr. Teryl Lucy [facility]

## 2011-03-12 NOTE — Procedures (Signed)
[place]. [facility]  Patient:    LAVONNE, KINDERMAN Visit Number: [id number] MRN: [id number]          Service Type: MED Location: 2000 2027 01 Attending Physician:  Madaline Guthrie Dictated by:   Florencia Reasons, M.D. Proc. Date: 12/22/01 Admit Date:  12/17/2001   CC:         Marene Lenz, M.D.  Outpatient Clinic Chart   Procedure Report  PROCEDURE:  Upper endoscopy.  INDICATION:  A 60 year old African-American female with chronic abdominal pain and nausea, as well as history of fibromyalgia, chronic pelvic pain, possible bipolar disorder, and diabetes.  CT scan, ultrasound, gastric emptying scan have been unrevealing.  FINDINGS:  Moderate bile reflux (100 cc).  Hiatal hernia with esophageal ring.  PROCEDURE:  The nature, purpose, and risks of the procedure had been discussed with the patient, who provided written consent and brought in a fasted state from her hospital room to the Endoscopy Unit.  Sedation was fentanyl 75 mcg and Versed 6 mg IV without arrhythmias or desaturation.  The Olympus small caliber adult video endoscope was passed under direct vision, entering the esophagus quite easily.  The vocal cords were not well seen.  The esophageal mucosa was normal, but a short distance above the squamocolumnar junction and also at the squamocolumnar junction, there were widely patent esophageal rings.  Below this was a roughly 1-2 cm hiatal hernia.  No esophageal varices, infection or neoplasia were observed.  The stomach contained a moderate bilious residual of approximately 100 cc, but no gastritis, erosions, ulcers, polyps or masses were seen.  Retroflexed view into the proximal stomach was unremarkable.  I did not see any antral contractility during this examination, but that is of uncertain clinical significance while the patient is under sedation.  The pylorus, duodenal bulb, and second duodenum looked normal.  The scope was removed  from the patient, who tolerated the procedure well and without any apparent complication.  IMPRESSION: 1. Widely patent esophageal rings, possibly related to her history of    dysphagia. 2. Moderate bilious residual, possibly accounting for her nausea. 3. No antral contractility seen, which could conceivably imply gastric    paresis, but I tend to doubt its clinical significance with a negative    gastric emptying scan. 4. No source of heme positivity identified during this examination.  PLAN: 1. Trial of Carafate. 2. Eventual colonoscopy for evaluation of her heme positive stool. 3. Possible eventual esophageal dilatation because of her esophageal    dysphagia symptoms although, the rings appear rather widely patent and    there remains uncertainty as to whether they are accounting for her    dysphagia symptoms. Dictated by:   Florencia Reasons, M.D. Attending Physician:  Madaline Guthrie DD:  12/22/01 TD:  12/22/01 Job: [id number]

## 2011-03-12 NOTE — Discharge Summary (Signed)
[place]. [facility]  Patient:    [name], [name]                          MRN: [id number] Adm. Date:  [id number] Disc. Date: [id number] Attending:  Edwyna Perfect Dictator:   Blanch Media, M.D. CC:         Arsenio Loader, M.D., Outpatient Clinic   Discharge Summary  PRIMARY CARE PHYSICIAN:  Arsenio Loader, M.D.  DISCHARGE MEDICATIONS: 1. Vasotec 10 mg 1 p.o. q.d. 2. Glucophage 500 mg 1 p.o. b.i.d. 3. Clindamycin 300 mg 1 q.i.d. x 10 days. 4. Vicodin 1 p.o. q.6h. p.r.n. pain.  DISCHARGE DIAGNOSES: 1. Left peritonsillar abscess. 2. Parotiditis. 3. Diabetes. 4. Hypertension.  ADMITTING HISTORY:  The patient is a 60 year old female who presents with a four-day history of a sore throat and swelling of her jaw, mostly on the left. She has had difficulty swallowing as well as severe left earache.  She denies shortness of breath, difficulty breathing, or wheezing.  She has had subjective fever, but did not report a temperature at home.  She has no tinnitus or hearing loss but is unable to open her mouth fully.  ADMITTING PHYSICAL:  VITAL SIGNS:  Temperature 98.0, pulse 129, blood pressure 149/87, respiratory rate 20, O2 saturation 96% on room air.  GENERAL:  Patient is in pain, but in no acute respiratory distress.  HEENT:  Shows bilateral parotid enlargement, positive trismus, soft tissue swelling on left neck and tender anterior cervical adenopathy, left greater than right.  No stridor is present.  LUNGS:  Clear to auscultation.  ADMITTING LABORATORY DATA:  White blood cell count 12.6, ANC 7.8, hemoglobin 13.0, platelets 236.  An i-STAT-8: Sodium 134, potassium 4.7, chloride 98, CO2 26, BUN 16, creatinine 1.1, glucose 436.  X-ray of the neck was normal, rapid group A strep was negative.  CT of the neck with contrast showed enlargement of the parotid glands bilaterally, left greater than right with a questionable acute parotiditis.  It also  showed soft tissue ______ defect in the left pharyngeal wall with deviated airway to the right, likely abscess.  HOSPITAL COURSE: #1 - PERITONSILLAR ABSCESS:  Dr. Annalee Genta of ENT was consulted and performed an I&D of the peritonsillar abscess in the emergency department.  Culture of this fluid showed moderate white blood cells, few gram-positive cocci with a culture result of Streptococcus pneumoniae.  The patient did not have any airway compromise throughout her hospital stay and was able to take p.o. prior to discharge.  The patient was started on clindamycin in the emergency department as she was penicillin allergic.  Blood cultures were negative at three days.  Patient received IV hydration throughout her hospital stay as her p.o. intake was not sufficient until prior to discharge.  The patients glucose level was elevated on admission.  She received low-dose NPH until she was able to take full p.o. when she was put back on her home dose of glucophage.  She had been off of her medications four weeks prior to admission and this resulted in her diabetes being not under control.  The patient was found to be hypertensive and was started on Vasotec, as she was also a diabetic.  Case management was consulted as the patient did not have any access to getting her medications.  She is to report to the clinic downstairs to get recertified for county pharmacy.  She will be able to do this prior to  her discharge.  With her county pharmacy card, she will be able to obtain her medications.  DISCHARGE DISPOSITION:  The patient already had an appointment scheduled for Dr. Moshe Cipro in the outpatient clinic on April 18 at 8:45 a.m. DD:  02/06/01 TD:  02/06/01 Job: [id number]

## 2011-03-12 NOTE — Discharge Summary (Signed)
NAME:  [name], [name] NO.:  [id number]   MEDICAL RECORD NO.:  [id number]                   PATIENT TYPE:  IPS   LOCATION:  0302                                 FACILITY:  [facility]   PHYSICIAN:  Jeanice Lim, M.D.              DATE OF BIRTH:  1951-07-30   DATE OF ADMISSION:  11/06/2002  DATE OF DISCHARGE:  11/10/2002                                 DISCHARGE SUMMARY   IDENTIFYING DATA:  This is a 60 year old African American female voluntarily  admitted, referred by [facility], after presenting with  suicidal thoughts and being unable to contract for safety.  She had an  increase in pain and tingling in her extremities.  This may have been due to  diabetic nephropathy, but the chronic pain had become overwhelming.  She had  made a suicidal threat in the medical primary care physician's office.  The  patient had been followed by Dr. Hortencia Pilar at the [facility] in the past.   MEDICATIONS:  1. Avandia.  2. Glucophage.  3. NPH Insulin.  4. Clonidine.  5. Prilosec 40 mg a day.  6. Elavil 100 mg q.h.s.  7. Neurontin 300 mg t.i.d.  It was not clear whether she had benefited from     Neurontin in the past.  8. Xenical 120 mg t.i.d.  9. Hydrochlorothiazide 25 mg daily.  10.      Vicodin 5/500 mg q.6h. p.r.n.   ALLERGIES:  Drug allergies to PENICILLIN, TAGAMET, SULFA DRUGS, and VICODIN,  which made her nauseated in the past, but she was able to tolerate the  Vicodin.   LABORATORY DATA:  The patient was mildly anemic with a hemoglobin and  hematocrit of 10.8 and 33.2, respectively.  The white count was 7.7.  Glucose elevated at 199, BUN 20, creatinine 1.1, and creatinine kinase 219.  TSH 0.929.  Rheumatoid factor less than 20.  Hepatitis B negative and  hepatitis C negative.   MENTAL STATUS EXAMINATION:  A medium-built, overweight, African American  female, appropriately groomed, a bit drowsy, and a little bit  dizzy.  She  reported feeling dysphoric due to pain.  Her mood was depressed with a  strong sense of hopelessness related to her medical problems and  discouragement about chronic pain.  Thought processes were dominated by  concerns about her medical situation and if anyone would be able to  alleviate her pain, feeling somewhat desperate.  She denied acute homicidal  or suicidal ideations, but admitted to thoughts of overdosing with her  medications prior to admission.  She was cognitive and intact.  Judgment and  insight fair.   ADMISSION DIAGNOSES:   AXIS I:  Major depression disorder, recurrent, severe.   AXIS II:  None.   AXIS III:  1. Diabetes mellitus.  2. Neuropathy.  3. Fibromyalgia.  4. Gastrointestinal reflux disease.  5.  Hypertension.   AXIS IV:  Psychosocial Stressors:  Severe due to medical problems.   AXIS V:  Global Assessment of Functioning:  28-30/70.   HOSPITAL COURSE:  The patient was admitted and ordered routine p.r.n.  medications.  She underwent further monitoring.  She was encouraged to  participate in individual, group, and milieu therapy.  She was resumed on  medical medications, including Avandia, Glucophage, NPH Insulin, Altace,  clonidine, Prilosec, Prozac, Elavil, Neurontin, Xenical,  hydrochlorothiazide, and Vicodin p.r.n.  The Vicodin was then discontinued  due to the patient's history of having difficulty tolerating this and she  was ordered Darvocet.  She was monitored for blood sugars with Accu-Cheks  and placed on a diabetic diet.  The patient was followed up medically by  internal medicine who optimized control of her medical conditions as we  targeted control of depressive symptoms and the experienced of pain.  Prozac  was optimized, as well as Elavil.  Neurontin was then optimized for chronic  pain.  The patient reported a dramatic positive response to medication  changes, reporting control of pain and therefore significant improvement  in  mood.   CONDITION ON DISCHARGE:  Markedly improved.  Mood was less depressed.  Affect brighter.  Thought processes goal directed.  Thought content negative  for dangerous ideation or psychotic symptoms.  The patient reported that her  pain was under control and she no longer felt hopeless or helpless since she  has realized that her condition is treatable.  Therefore, she was motivated  to be compliant with aftercare recommendations.   DISCHARGE MEDICATIONS:  1. Darvocet-N 100/650 one q.6h. p.r.n. pain.  2. Vistaril 25 mg b.i.d.  3. Prozac 25 mg three q.a.m.  4. Neurontin 300 mg q.i.d.  5. Insulin Humulog 75/25 37 units subcu a.m. and 17 units subcu at 5 p.m.  6. Lotensin 20 mg q.a.m.  7. Iron 325 mg t.i.d.  8. Avandia 10 mg q.a.m.  9. Glucophage 500 mg two b.i.d.  10.      Clonidine 0.1 mg b.i.d.  11.      Protonix 40 mg b.i.d.  12.      Xenical 120 mg t.i.d.  13.      Hydrochlorothiazide 25 mg q.a.m.   FOLLOW-UP:  The patient was to follow up with the [facility] and Dr. __________ on November 14, 2002.  She is to see her  primary care physician as soon as possible to closely follow up her multiple  medical conditions in addition to psychiatric follow-up.   DISCHARGE DIAGNOSES:   AXIS I:  Major depression disorder, recurrent, severe.   AXIS II:  None.    AXIS III:  1. Diabetes mellitus.  2. Neuropathy.  3. Fibromyalgia.  4. Gastrointestinal reflux disease.  5. Hypertension.   AXIS IV:  Psychosocial Stressors:  Severe due to medical problems.   AXIS V:  Global Assessment of Functioning:  55.                                               Jeanice Lim, M.D.    JEM/MEDQ  D:  11/21/2002  T:  11/21/2002  Job:  [id number]

## 2011-03-12 NOTE — Procedures (Signed)
[place]. [facility]  Patient:    [name], BADILLA Visit Number: [id number] MRN: [id number]          Service Type: END Location: ENDO Attending Physician:  Rich Brave Dictated by:   Florencia Reasons, M.D. Proc. Date: 03/01/02 Admit Date:  03/01/2002 Discharge Date: 03/01/2002   CC:         Marene Lenz, M.D.   Procedure Report  PROCEDURE:  Colonoscopy.  INDICATION:  A 60 year old African-American female with a history of Hemoccult positive stool.  FINDINGS:  Normal exam to the cecum.  DESCRIPTION OF PROCEDURE:  The nature, purpose and risks of the procedure had been discussed with patient, who provided written consent.  SEDATION:  (For this procedure and the upper endoscopy which preceded it) Total fentanyl 125 mcg and Versed 12 mg IV.  Resulting in just mild sedation. The patient has a history of bipolar disorder and was difficult to sedate, and was yelling throughout much of the procedure.  DESCRIPTION OF PROCEDURE:  The Olympus adjustable tension pediatric video colonoscope was advanced with some looping, overcome by taking out loops during advancement.  With the patient in the supine position and use of external abdominal compression, I was able to get around a somewhat tortuous and redundant colon to the cecum (this identified by clear visualization of the appendiceal orifice), after which pullback was performed.  The quality of the prep was excellent, and it is felt that all areas were well seen.  This was a normal examination.  No polyps, cancer, colitis, vascular malformations of diverticulosis were noted.  Retroflexion of the rectum as well as reinspection of the rectum and distal sigmoid was unremarkable.  No biopsies were obtained. The patient tolerated the procedure well and there were no apparent complications.  IMPRESSION:  Normal colonoscopy.  No source of previous hemoccult positive stool noted.  PLAN:  Follow-up per  the discretion of the patients primary care physician. Consideration could be given to doing outpatient hemoccults and, if positive, doing a small bowel series.  Although, I tend to think this would be of low yield. Dictated by:   Florencia Reasons, M.D. Attending Physician:  Rich Brave DD:  03/01/02 TD:  03/04/02 Job: [id number]

## 2011-03-19 ENCOUNTER — Encounter: Payer: Self-pay | Admitting: Internal Medicine

## 2011-04-05 ENCOUNTER — Other Ambulatory Visit: Payer: Self-pay | Admitting: Internal Medicine

## 2011-04-21 ENCOUNTER — Telehealth: Payer: Self-pay | Admitting: Dietician

## 2011-04-21 NOTE — Telephone Encounter (Signed)
Patient requests referral to RD, CDE. Appointment scheduled. Need referrals authorized by physician.

## 2011-04-26 ENCOUNTER — Ambulatory Visit (INDEPENDENT_AMBULATORY_CARE_PROVIDER_SITE_OTHER): Payer: PRIVATE HEALTH INSURANCE | Admitting: Dietician

## 2011-04-26 DIAGNOSIS — E119 Type 2 diabetes mellitus without complications: Secondary | ICD-10-CM

## 2011-04-26 NOTE — Patient Instructions (Signed)
PLease read marked pages in carb counting book.  Make a follow up appointment for 3 weeks- please bring meter with you.   Jamison Neighbor [phone]

## 2011-04-29 ENCOUNTER — Other Ambulatory Visit: Payer: Self-pay | Admitting: Cardiovascular Disease

## 2011-05-06 NOTE — Progress Notes (Signed)
Medical Nutrition Therapy:  Appt start time: 1330 end time:  1430.  Assessment:  Primary concerns today: Blood sugar control Usual eating pattern includes Meal 3 and 1+ snacks per day. First two meals light then sseems to eat heavier in evening. Reportedly high intake fop fat- skins, sausage, fried foods 3x a month, chips. Drinks about 12 cups caffienated coffee per day - black. Seeing a therapist ongoing. Has stated problem with eating junk food, ice cream and sweets. A1C more recently 6.6%. Creatinine 1.52 Avoided foods include: white flour & sugar and generally tries to avoid sweets.   Usual physical activity includes walking and adls- was going to silver sneakers and lifting weight 3-5 days a week, but hasn' been lately. On 37 units Novolog MIx before breakfast and 26 units before evening meal. Verbalizes that she;d like husband to be more supportive.   Progress Towards Goal(s):  In progress   Nutritional Diagnosis:  NB-1.1 Food and nutrition-related knowledge deficit As related to not being aware of carb content of foods and their impact on blood sugars.  As evidenced by patient report.   Intervention: 1- Education and counseling on purpose and benefits of learning about carbs 2- see patient instructions 3- Social support provided     Monitoring/Evaluation:  Dietary intake and blood sugars ( meter) in 4 week(s)

## 2011-05-08 ENCOUNTER — Encounter: Payer: Self-pay | Admitting: Internal Medicine

## 2011-05-17 ENCOUNTER — Ambulatory Visit: Payer: PRIVATE HEALTH INSURANCE | Admitting: Dietician

## 2011-06-02 ENCOUNTER — Encounter: Payer: Self-pay | Admitting: Internal Medicine

## 2011-06-02 ENCOUNTER — Ambulatory Visit (INDEPENDENT_AMBULATORY_CARE_PROVIDER_SITE_OTHER): Payer: PRIVATE HEALTH INSURANCE | Admitting: Internal Medicine

## 2011-06-02 VITALS — BP 122/73 | HR 81 | Temp 98.1°F | Wt 206.3 lb

## 2011-06-02 DIAGNOSIS — M549 Dorsalgia, unspecified: Secondary | ICD-10-CM

## 2011-06-02 DIAGNOSIS — K5900 Constipation, unspecified: Secondary | ICD-10-CM

## 2011-06-02 DIAGNOSIS — I10 Essential (primary) hypertension: Secondary | ICD-10-CM

## 2011-06-02 DIAGNOSIS — F419 Anxiety disorder, unspecified: Secondary | ICD-10-CM | POA: Insufficient documentation

## 2011-06-02 DIAGNOSIS — R159 Full incontinence of feces: Secondary | ICD-10-CM | POA: Insufficient documentation

## 2011-06-02 DIAGNOSIS — M1990 Unspecified osteoarthritis, unspecified site: Secondary | ICD-10-CM

## 2011-06-02 DIAGNOSIS — F411 Generalized anxiety disorder: Secondary | ICD-10-CM

## 2011-06-02 DIAGNOSIS — E119 Type 2 diabetes mellitus without complications: Secondary | ICD-10-CM

## 2011-06-02 DIAGNOSIS — K219 Gastro-esophageal reflux disease without esophagitis: Secondary | ICD-10-CM

## 2011-06-02 DIAGNOSIS — E785 Hyperlipidemia, unspecified: Secondary | ICD-10-CM

## 2011-06-02 LAB — POCT GLYCOSYLATED HEMOGLOBIN (HGB A1C): Hemoglobin A1C: 6.8

## 2011-06-02 LAB — TSH: TSH: 0.929 u[IU]/mL (ref 0.350–4.500)

## 2011-06-02 LAB — GLUCOSE, CAPILLARY: Glucose-Capillary: 110 mg/dL — ABNORMAL HIGH (ref 70–99)

## 2011-06-02 MED ORDER — SORBITOL 70 % PO SOLN: 15.0000 mL | Freq: Every day | ORAL | Status: AC | PRN

## 2011-06-02 MED ORDER — OXYCODONE-ACETAMINOPHEN 10-325 MG PO TABS: 1.0000 | ORAL_TABLET | Freq: Two times a day (BID) | ORAL | Status: DC | PRN

## 2011-06-02 NOTE — Assessment & Plan Note (Addendum)
Patient's HbA1c was 6.8, and PCOT 110.  She reports that she is compliant with Insulin and Metformin.  Patient brought glucometer to visit, but data could not be downloaded as something was wrong with the date.  The problem was fixed, so this should not be an issue for future visits.    Regarding risk factors, she is not a current smoker, her lipid panal in February 2012 was significant for only low HDL, she has started exercising 3x/week at the [facility], and her blood pressure is well controlled.    Plan: Continue current regimen of metformin and insulin

## 2011-06-02 NOTE — Assessment & Plan Note (Signed)
Patient's fasting lipid panal in February 2012 was WNL except a low HDL of 23.  She has begun exercising and is compliant with her current therapy of simvastatin.  Plan: Continue Simvastatin.

## 2011-06-02 NOTE — Progress Notes (Signed)
  Subjective:    Patient ID: [name], female    DOB: 03/23/1951, 60 y.o.   MRN: [id number]  HPI Patient is a 60 yo woman with PMH significant for DMII, HLD, HTN, CAD, GERD, sleep apnea presents with  1. Right hand, b/l knee and lower back pain.  She reports that at worst, her hand and back pain is 11/10 and her knee pain is 8/10.  She gets relief with rest and percocet (down to 1/10), which she takes 1 tablet every 1-2 days.  She notes that advil doesn't help at all.  She reports that the pain worsens when doing chores.  She denies any particular time of day when the pain is worse.  She reports some numbness/tingling in medial aspect of both hands, & report that she has been told she has carpel tunnel in the past after having nerve conduction studies done.  The pain is dull and achy in nature.    2. Gas/fecal incontinence: She reports a long history of constipation, but has also had fecal incontinence over the last year. She reports that last Monday (05/31/11) she experienced fecal incontinence & did not know that she had a BM until she went to the bathroom later to urinate.  Her last normal BM in the toilet was on Sunday AM and it was soft.  She does report using [employer] diapers a few months ago at night because she was experiencing fecal incontinence with non-bloody diarrhea for 3 months intermittently.  She is inconsistent with her use of colace.  She has not taken colace since her BM on Sunday.  Finally, she notes that on several occassions, she'll sit to use the restroom with the intention of passing gas, and has a bowel movement instead.  She has seen Dr. Drexel Iha of GI in the past, and has had several testing that was all normal, including a normal colonoscopy in 2009.    3. Difficulty sleeping: Patient reports fluctuating between having difficulty falling asleep and difficulty staying asleep.  She has had this problem for years, and is currently being treated with trazodone, which she does not  like as it gives her dry mouth.  She tried using OTC melatonin but that has not worked.  She is currently seeing Dr. Gerre Pebbles at [facility] for therapy, and did not wish to discuss her psychological problems in detail at this visit.     Review of Systems ROS: General: no fevers, chills, changes in weight, changes in appetite Skin: no rash HEENT: no blurry vision, hearing changes, sore throat Pulm: no dyspnea, coughing, wheezing CV: no chest pain, palpitations, shortness of breath Abd: no abdominal pain, nausea/vomiting GU: no dysuria, hematuria, polyuria Ext: no myalgias Neuro: no weakness     Objective:   Physical Exam  General: resting in bed HEENT: PERRL, EOMI, no scleral icterus Cardiac: RRR, no rubs, murmurs or gallops Pulm: clear to auscultation bilaterally, moving normal volumes of air Abd: soft, nontender, nondistended, BS present Ext: warm and well perfused, no pedal edema, normal ROM of b/l knees; mild erythema of 1st and 2nd MCP joints of b/l hands, no edema surrounding joints, normal ROM of b/l hands/fingers, no ulcers on b/l feet Neuro: alert and oriented X3, cranial nerves II-XII grossly intact, strength and sensation to light touch equal in bilateral upper and lower extremities, normal rectal tone/sensation      Assessment & Plan:

## 2011-06-02 NOTE — Patient Instructions (Signed)
-  Regarding musculoskeletal pain, pain is likely due to arthritis.  Continue taking Percocet as needed.  Please schedule another appointment if pain worsens.  -Regarding problems with bowel movements, it is important for you to maintain a regular bowel movement schedule (every 1-2 days).  Please use discontinue colace, and start sorbitol solution.  Start with 1 tablespoon in morning coffee/juice/water and you may increase to 5 tablespoons as needed.    -Regarding difficulty sleeping, please discuss this with your psychiatrist.  I would like to hold off on another sleep aid until you are further into counseling.  You may also try a warm glass of milk before sleeping.    -Please ask your psychiatrist to refill valium.  If they are unable to do so, please call the clinic at [phone].  -Please schedule follow up appointment for 5-6 weeks to reassess problems with bowel movements.

## 2011-06-02 NOTE — Assessment & Plan Note (Signed)
Patient reports she rarely feels symptoms of heartburn.  She is currently being treated with dexilant.  She also endorses eating dinner early and sitting up for a few hours before bed.  Plan: Continue Dexilant

## 2011-06-02 NOTE — Assessment & Plan Note (Signed)
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  She reports pain relief with percocet, and no relief with advil.  While NSAIDs are the treatment of choice with OA, given patient's history of GERD and the fact that pain is well managed on percocet currently, we will continue percocet for now.  If pain worsens or does not improve, we may consider transitioning to stronger NSAIDs.

## 2011-06-02 NOTE — Assessment & Plan Note (Addendum)
Patient is likely suffering with fecal incontinence 2/2 overflow incontinence given her history of constipation and opioid use.  We will check TSH to assess if hypothyroidism is the source of her constipation.  Her rectal tone and sensation were normal, so a neurological etiology is unlikely.  Plan: Discontinue colace and start sorbitol daily, with a goal of 1 BM every 1-2 days.  Patient to be seen in 5-6 weeks to reassess bowel problems.

## 2011-06-02 NOTE — Assessment & Plan Note (Addendum)
Patient is currently taking valium and citalopram.  She reports that her chest pain resolved after she was treated with valium & percocet by Dr. Genia Hotter.  This chest pain was likely 2/2 anxiety.  She is being seen by Dr. Gerre Pebbles at [facility] about twice/week for therapy.  Vesta Mixer is also managing her citalopram.  She requested a refill of her valium, but I suggested that all of her psychotropic medications be managed by one physician.  She is scheduled for a MAD evaluation tomorrow (06/03/11), where she will request valium.  If they do not feel comfortable managing her valium, I suggested she call us back at the clinic.  I do not suspect that she is abusing benzodiazepines as she has only had 1 prescription filled last May.  She also reported having difficulty sleeping, but I asked that she discuss this with her therapist/psychiatrist as her sleeping difficulties are likely related to her psychological problems.  She reports that she does not like trazodone as she has significant dry mouth after taking it.  She did not want to discuss in detail what she discusses with Dr. Gerre Pebbles, but did report that she has difficulty managing her anger and feels therapy is helpful.    Given her difficulty sleeping and history of constipation, we are rechecking a TSH for possible hypothyroidism.  Also, if this problem persists, I will readdress her sleep apnea at her next visit.  Plan: Continue outpatient therapy at [facility]. Continue citalopram & valium as per Dr. Gerre Pebbles.  Check TSH.

## 2011-06-02 NOTE — Assessment & Plan Note (Signed)
Patient's blood pressure was well controlled today.  She reports compliance with her current therapy with amlodipine, imdur, metoprolol & losartan-hctz.    Plan: continue current antihypertensive regimen.

## 2011-06-03 ENCOUNTER — Other Ambulatory Visit: Payer: Self-pay | Admitting: *Deleted

## 2011-06-03 DIAGNOSIS — M549 Dorsalgia, unspecified: Secondary | ICD-10-CM

## 2011-06-03 NOTE — Progress Notes (Signed)
[name]'s history and physical examination were reviewed with Dr. Milbert Coulter.  Her assessment and plan were formulated together.  I agree with Dr. Tammy Sours documentation.

## 2011-06-04 MED ORDER — DIAZEPAM 5 MG PO TABS: 5.0000 mg | ORAL_TABLET | Freq: Three times a day (TID) | ORAL | Status: DC | PRN

## 2011-06-07 ENCOUNTER — Other Ambulatory Visit: Payer: Self-pay | Admitting: Internal Medicine

## 2011-06-07 ENCOUNTER — Telehealth: Payer: Self-pay | Admitting: *Deleted

## 2011-06-07 NOTE — Telephone Encounter (Signed)
Valium rx  Per Dr. Milbert Coulter called to [facility] pharmacy.

## 2011-06-07 NOTE — Telephone Encounter (Signed)
Called to pharm, pt informed 

## 2011-06-15 ENCOUNTER — Other Ambulatory Visit: Payer: Self-pay | Admitting: Internal Medicine

## 2011-06-17 ENCOUNTER — Other Ambulatory Visit: Payer: Self-pay | Admitting: Cardiovascular Disease

## 2011-07-14 ENCOUNTER — Ambulatory Visit (INDEPENDENT_AMBULATORY_CARE_PROVIDER_SITE_OTHER): Payer: PRIVATE HEALTH INSURANCE | Admitting: Internal Medicine

## 2011-07-14 ENCOUNTER — Encounter: Payer: Self-pay | Admitting: Internal Medicine

## 2011-07-14 DIAGNOSIS — K5900 Constipation, unspecified: Secondary | ICD-10-CM | POA: Insufficient documentation

## 2011-07-14 DIAGNOSIS — F32A Depression, unspecified: Secondary | ICD-10-CM | POA: Insufficient documentation

## 2011-07-14 DIAGNOSIS — F411 Generalized anxiety disorder: Secondary | ICD-10-CM

## 2011-07-14 DIAGNOSIS — E119 Type 2 diabetes mellitus without complications: Secondary | ICD-10-CM

## 2011-07-14 DIAGNOSIS — Z0000 Encounter for general adult medical examination without abnormal findings: Secondary | ICD-10-CM

## 2011-07-14 DIAGNOSIS — F329 Major depressive disorder, single episode, unspecified: Secondary | ICD-10-CM

## 2011-07-14 DIAGNOSIS — I10 Essential (primary) hypertension: Secondary | ICD-10-CM

## 2011-07-14 DIAGNOSIS — E785 Hyperlipidemia, unspecified: Secondary | ICD-10-CM

## 2011-07-14 DIAGNOSIS — N189 Chronic kidney disease, unspecified: Secondary | ICD-10-CM

## 2011-07-14 DIAGNOSIS — Z23 Encounter for immunization: Secondary | ICD-10-CM

## 2011-07-14 DIAGNOSIS — F419 Anxiety disorder, unspecified: Secondary | ICD-10-CM

## 2011-07-14 DIAGNOSIS — R159 Full incontinence of feces: Secondary | ICD-10-CM

## 2011-07-14 LAB — COMPREHENSIVE METABOLIC PANEL
ALT: 18 U/L (ref 0–35)
Albumin: 4.3 g/dL (ref 3.5–5.2)
CO2: 27 mEq/L (ref 19–32)
Calcium: 9.8 mg/dL (ref 8.4–10.5)
Chloride: 102 mEq/L (ref 96–112)
Sodium: 139 mEq/L (ref 135–145)
Total Protein: 7.7 g/dL (ref 6.0–8.3)

## 2011-07-14 MED ORDER — PSYLLIUM 28 % PO PACK: 1.0000 | PACK | Freq: Two times a day (BID) | ORAL | Status: DC

## 2011-07-14 MED ORDER — ZOSTER VACCINE LIVE 19400 UNT/0.65ML ~~LOC~~ SOLR: 0.6500 mL | Freq: Once | SUBCUTANEOUS | Status: DC

## 2011-07-14 MED ORDER — DOCUSATE SODIUM 100 MG PO CAPS: 100.0000 mg | ORAL_CAPSULE | Freq: Two times a day (BID) | ORAL | Status: DC

## 2011-07-14 NOTE — Assessment & Plan Note (Signed)
Patient's BP is elevated today because she has not taken medications yet today. She is usually compliant with BP medications and has been at goal during past appointments.  Plan: Continue amlodipine 5mg  (if constipation continues to be a problem after seeing GI, may consider discontinuing), Imdur 60mg , Hyzaar 100-25, metoprolol 25mg  daily.

## 2011-07-14 NOTE — Assessment & Plan Note (Addendum)
Please see A/P for fecal incontinence.  Of note, patient had normal gastric emptying study in 2003.    TSH normal. Gastric emptying study in 2003 normal.  DM under control.  CCB may need to be discontinued in the future.

## 2011-07-14 NOTE — Progress Notes (Signed)
Subjective:   Patient ID: [name] female   DOB: 1950-11-05 60 y.o.   MRN: [id number]  HPI: [name] is a 60 y.o. woman that presents for follow up for fecal incontinence.  She notes that the problem resolved for about 1 week while she was taking sorbitol, but she stopped taking sorbitol because she was feeling bloated & gassy.  She is also uncertain if problem actually did resolve because symptoms tend to be intermittent.  Her last BM was Saturday (4 days ago), which is also when she last experienced fecal incontinence.   Constipation has been a problem for years, but fecal incontinence has been occuring for the last 4 months.    [name] also notes that she has been feeling anxious and has been isolating herself, partly because of fecal incontinence.  She notes she has been sleeping and eating more.  She missed her therapy appointment last week because she did not feel like going.  She hasn't been visiting family/friends and going to church as much.  She has been compliant with antidepressant medications.  She last took a valium last night, and she takes one on average twice a week.  She denies suicidal ideation, but has a history of suicide attempt in 1999 or 2000.  She does endorse feeling a bit more teary lately, but reports this is when she hears about worldly news.    She endorses right hand pain, and has a history of carpel tunnel.  She gets relief with percocet, that she takes 1-4 tabs/week.  Pain has improved with PT in the past.  This is not a significant concern of hers at the moment.    Past Medical History  Diagnosis Date  . Hypertension   . Peripheral neuropathy   . Cervicalgia   . Low back pain syndrome   . Hyperlipidemia   . Vitamin D deficiency   . Vitamin B12 deficiency   . Iliotibial band syndrome   . Anemia   . Borderline personality disorder   . Nonorganic psychosis   . GERD (gastroesophageal reflux disease)   . Diabetes mellitus   . Depression   .  Diabetic gastroparesis   . Sleep apnea   . CAD (coronary artery disease)     Catherization 11/18/09>nonobstructive CAD , normal LV function, EF 65%  . Health maintenance examination     Colonoscopy 2009> normal; Diabetic eye exam 12/2008. No diabetic retinopathy; Mammogram 11/10> No evidence of malignancy   Current Outpatient Prescriptions  Medication Sig Dispense Refill  . amLODipine (NORVASC) 5 MG tablet take 1 tablet by mouth once daily  30 tablet  5  . aspirin 81 MG tablet Take 81 mg by mouth daily.        Marland Kitchen buPROPion (WELLBUTRIN XL) 300 MG 24 hr tablet Take 300 mg by mouth daily.        . citalopram (CELEXA) 40 MG tablet Take 40 mg by mouth daily.        Marland Kitchen dexlansoprazole (DEXILANT) 60 MG capsule Take 60 mg by mouth daily.        . diazepam (VALIUM) 5 MG tablet Take 1 tablet (5 mg total) by mouth every 8 (eight) hours as needed.  90 tablet  2  . gabapentin (NEURONTIN) 800 MG tablet Take 1 tablet (800 mg total) by mouth 3 (three) times daily. Take one tablet by mouth in am and 2 tablets by mouth in pm  90 tablet  11  . isosorbide mononitrate (IMDUR)  60 MG 24 hr tablet take 1 tablet by mouth once daily  30 tablet  2  . losartan-hydrochlorothiazide (HYZAAR) 100-25 MG per tablet take 1 tablet by mouth once daily  30 tablet  5  . metFORMIN (GLUCOPHAGE) 1000 MG tablet Take 1 tablet (1,000 mg total) by mouth 2 (two) times daily with a meal.  60 tablet  11  . metoprolol succinate (TOPROL-XL) 25 MG 24 hr tablet tablet 1/2 tablet by mouth once daily  20 tablet  2  . NOVOLOG MIX 70/30 FLEXPEN (70-30) 100 UNIT/ML injection INJECT 37 UNITS EVERY MORNING AND 26 UNITS IN THE EVENING  15 Syringe  11  . oxyCODONE-acetaminophen (PERCOCET) 10-325 MG per tablet Take 1 tablet by mouth 2 (two) times daily as needed.  60 tablet  0  . simvastatin (ZOCOR) 20 MG tablet take 1 tablet by mouth at bedtime  90 tablet  1  . docusate sodium (COLACE) 100 MG capsule Take 1 capsule (100 mg total) by mouth 2 (two) times  daily.  60 capsule  3  . psyllium (METAMUCIL SMOOTH TEXTURE) 28 % packet Take 1 packet by mouth 2 (two) times daily.  60 packet  11  . traZODone (DESYREL) 100 MG tablet Take 100 mg by mouth at bedtime.        . zoster vaccine live, PF, (ZOSTAVAX) 16109 UNT/0.65ML injection Inject 19,400 Units into the skin once.  1 vial  0   Family History  Problem Relation Age of Onset  . Diabetes Mother   . Hypertension Mother   . Hyperlipidemia Mother   . Arthritis Mother   . Heart disease Father   . Diabetes Sister   . Diabetes Brother    History   Social History  . Marital Status: Married    Spouse Name: N/A    Number of Children: N/A  . Years of Education: N/A   Social History Main Topics  . Smoking status: Former Smoker    Types: Cigarettes    Quit date: 10/25/2002  . Smokeless tobacco: None  . Alcohol Use: No  . Drug Use: No  . Sexually Active: None   Review of Systems:  General: no fevers, chills, changes in weight, increase in appetite Skin: no rash HEENT: no blurry vision, hearing changes, sore throat Pulm: no dyspnea, coughing, wheezing CV: no chest pain, palpitations, shortness of breath Abd: no abdominal pain, nausea/vomiting, + constipation GU: no dysuria, hematuria, polyuria Ext: chronic right arm/hand pain 2/2 carpel tunnel Neuro: no weakness, numbness, or tingling  Objective:  Physical Exam: Filed Vitals:   07/14/11 1331  BP: 153/78  Pulse: 82  Temp: 99.1 F (37.3 C)  TempSrc: Oral  Weight: 208 lb 4.8 oz (94.484 kg)   Constitutional: Vital signs reviewed.  Patient is a well-developed and well-nourished woman in no acute distress and cooperative with exam.  Head: Normocephalic and atraumatic Mouth: no erythema or exudates, MMM Eyes: PERRL, EOMI, conjunctivae normal, No scleral icterus.  Neck: Supple, Trachea midline normal ROM,  Cardiovascular: RRR, S1 normal, S2 normal, no MRG, pulses symmetric and intact bilaterally Pulmonary/Chest: CTAB, no wheezes,  rales, or rhonchi Abdominal: Soft. Non-tender, mildly-distended, bowel sounds are normal, no masses, organomegaly, or guarding present.  GU: no CVA tenderness Musculoskeletal: No joint deformities, erythema, or stiffness, ROM full and no nontender Neurological: A&O x3, Strenght is normal and symmetric bilaterally, cranial nerve II-XII are grossly intact, no focal motor deficit Skin: Warm, dry and intact. No rash, cyanosis, or clubbing.  Psychiatric: slightly depressed  mood, but no suicidal ideation, speech and behavior is normal. Judgment and thought content normal. Cognition and memory are normal.   Assessment & Plan:  Case and care discussed with Dr. Meredith Pel.  Please see problem oriented charting for further details.  Patient to return in 2 months for DM check (last A1c drawn in August)

## 2011-07-14 NOTE — Assessment & Plan Note (Signed)
Denies suicidal & homicidal ideation today.  Doesn't feel down or depressed, more anxious.  Is isolating 2/2 fecal incontinence.  Sleeping and eating more.  Will continue to follow and encourage follow up with therapist.  Continue celexa 40 & wellbutrin 300.

## 2011-07-14 NOTE — Patient Instructions (Addendum)
-  Please be sure to follow up with your therapist as soon as possible.  -We will try metamucil (instead of sorbitol) with colace for your constipation, with a goal of 1 bowel movement a day.  We will also refer you to GI since your problem is not improving.  -If you problem worsens, please call us at [phone].  -Continue all of your current medications for diabetes and high blood pressure.  You're doing great!  -If any of your lab work is abnormal, we will call you.

## 2011-07-14 NOTE — Assessment & Plan Note (Addendum)
No active issues. Last A1c at goal (6.8).  Patient compliant with metformin and insulin.  Patient to return in 2 months for DM check.  Today, will obtain CMET fto re-evaluate renal function given patient is on metformin, and previous Cr=1.6. May need to consider replacing metformin with a sulfonylurea. Patient is scheduled for eye exam in October.     07/20/11: Given that Cr=1.48, we will discuss changing her antiglycemic regimen at her 3 month DM appointment.

## 2011-07-14 NOTE — Assessment & Plan Note (Signed)
Persistent problem for patient.  Normal rectal tone, denies prolapse, tenesmus, & diarrhea.  Last colonoscopy in 2009 - normal.  Plan: For now, use colace + metamucil for goal of 1 BM/day.  Refer to GI. She has been seen by GI for constipation before, but fecal incontinence is a new problem that she has not had.

## 2011-07-14 NOTE — Assessment & Plan Note (Signed)
Stable. No active issues.  Compliant with simvastain 20mg  qHS.  Will check CMET for liver function studies.

## 2011-07-15 NOTE — Progress Notes (Signed)
I saw patient and discussed her care with resident Dr. Milbert Coulter.  I agree with the plans as outlined in her note.

## 2011-07-27 ENCOUNTER — Other Ambulatory Visit: Payer: Self-pay | Admitting: Internal Medicine

## 2011-07-27 DIAGNOSIS — M549 Dorsalgia, unspecified: Secondary | ICD-10-CM

## 2011-07-27 LAB — POCT CARDIAC MARKERS
CKMB, poc: 1.4
CKMB, poc: <1 — ABNORMAL LOW
Myoglobin, poc: 141

## 2011-07-27 LAB — CBC
MCHC: 30.7
MCV: 67.9 — ABNORMAL LOW
Platelets: 322
RDW: 19.4 — ABNORMAL HIGH

## 2011-07-27 LAB — BASIC METABOLIC PANEL
BUN: 16
CO2: 25
Calcium: 9.3
Chloride: 105
Creatinine, Ser: 1.33 — ABNORMAL HIGH
GFR calc Af Amer: 50 — ABNORMAL LOW
Glucose, Bld: 62 — ABNORMAL LOW

## 2011-07-27 MED ORDER — OXYCODONE-ACETAMINOPHEN 10-325 MG PO TABS: 1.0000 | ORAL_TABLET | Freq: Two times a day (BID) | ORAL | Status: DC | PRN

## 2011-07-28 LAB — GLUCOSE, CAPILLARY
Glucose-Capillary: 57 — ABNORMAL LOW
Glucose-Capillary: 68 — ABNORMAL LOW

## 2011-07-30 LAB — GLUCOSE, CAPILLARY: Glucose-Capillary: 88 mg/dL (ref 70–99)

## 2011-08-06 LAB — BASIC METABOLIC PANEL
BUN: 11
BUN: 11
BUN: 14
BUN: 21
BUN: 5 — ABNORMAL LOW
CO2: 7 — CL
CO2: 18 — ABNORMAL LOW
CO2: 19
CO2: 19
CO2: 21
Calcium: 9.1
Calcium: 9.1
Chloride: 95 — ABNORMAL LOW
Chloride: 108
Chloride: 109
Chloride: 111
Chloride: 111
Chloride: 112
Chloride: 114 — ABNORMAL HIGH
Creatinine, Ser: 0.9
Creatinine, Ser: 1
Creatinine, Ser: 1.04
Creatinine, Ser: 1.05
GFR calc Af Amer: 48 — ABNORMAL LOW
GFR calc Af Amer: >60
GFR calc Af Amer: >60
GFR calc Af Amer: >60
GFR calc non Af Amer: 25 — ABNORMAL LOW
GFR calc non Af Amer: 50 — ABNORMAL LOW
GFR calc non Af Amer: 52 — ABNORMAL LOW
GFR calc non Af Amer: 53 — ABNORMAL LOW
GFR calc non Af Amer: 57 — ABNORMAL LOW
Glucose, Bld: 606
Glucose, Bld: 187 — ABNORMAL HIGH
Glucose, Bld: 197 — ABNORMAL HIGH
Glucose, Bld: 225 — ABNORMAL HIGH
Glucose, Bld: 256 — ABNORMAL HIGH
Potassium: 4.5
Potassium: 3.5
Potassium: 3.7
Potassium: 3.9
Potassium: 3.9
Potassium: 4
Potassium: 4.4
Potassium: 4.8
Potassium: 5
Sodium: 126 — ABNORMAL LOW
Sodium: 132 — ABNORMAL LOW
Sodium: 138

## 2011-08-06 LAB — CBC
HCT: 42.4
MCHC: 32.8
MCV: 73.9 — ABNORMAL LOW
MCV: 74.9 — ABNORMAL LOW
Platelets: 366
Platelets: 159
RBC: 4.46
RBC: 4.89
RDW: 17.6 — ABNORMAL HIGH
RDW: 17.1 — ABNORMAL HIGH
WBC: 11 — ABNORMAL HIGH
WBC: 9.5

## 2011-08-06 LAB — BLOOD GAS, ARTERIAL
Acid-base deficit: 16.8 — ABNORMAL HIGH
Bicarbonate: 9.3 — ABNORMAL LOW
TCO2: 8.5
pCO2 arterial: 22.9 — ABNORMAL LOW
pO2, Arterial: 104 — ABNORMAL HIGH

## 2011-08-06 LAB — DIFFERENTIAL
Basophils Absolute: 0
Basophils Relative: 0
Eosinophils Relative: 0
Lymphocytes Relative: 16
Monocytes Absolute: 0.1 — ABNORMAL LOW
Monocytes Relative: 1 — ABNORMAL LOW
Monocytes Relative: 5
Neutro Abs: 9.1 — ABNORMAL HIGH
Neutro Abs: 6.1
Neutrophils Relative %: 60

## 2011-08-06 LAB — COMPREHENSIVE METABOLIC PANEL
AST: 16
AST: 13
Albumin: 3.8
Albumin: 3.1 — ABNORMAL LOW
BUN: 32 — ABNORMAL HIGH
Calcium: 8.6
Creatinine, Ser: 2.22 — ABNORMAL HIGH
Creatinine, Ser: 1.31 — ABNORMAL HIGH
GFR calc Af Amer: 28 — ABNORMAL LOW
GFR calc Af Amer: 51 — ABNORMAL LOW
Potassium: 6.6
Total Protein: 9.3 — ABNORMAL HIGH

## 2011-08-06 LAB — HEPATIC FUNCTION PANEL
ALT: 13
AST: 17
Albumin: 2.8 — ABNORMAL LOW
Alkaline Phosphatase: 92
Total Protein: 6.2

## 2011-08-06 LAB — HEMOGLOBIN A1C: Hgb A1c MFr Bld: 14.3 — ABNORMAL HIGH

## 2011-08-06 LAB — PHOSPHORUS: Phosphorus: 1.4 — ABNORMAL LOW

## 2011-08-06 LAB — URINALYSIS, ROUTINE W REFLEX MICROSCOPIC
Nitrite: NEGATIVE
Protein, ur: 30 — AB
Specific Gravity, Urine: 1.031 — ABNORMAL HIGH
Urobilinogen, UA: 0.2

## 2011-08-06 LAB — LACTIC ACID, PLASMA: Lactic Acid, Venous: 1.3

## 2011-08-06 LAB — CARDIAC PANEL(CRET KIN+CKTOT+MB+TROPI)
CK, MB: 3.1
CK, MB: 3.2
Relative Index: 3 — ABNORMAL HIGH
Total CK: 106
Total CK: 123
Troponin I: 0.02

## 2011-08-06 LAB — URINE MICROSCOPIC-ADD ON

## 2011-08-06 LAB — MAGNESIUM: Magnesium: 2.2

## 2011-09-07 ENCOUNTER — Other Ambulatory Visit: Payer: Self-pay | Admitting: Internal Medicine

## 2011-09-13 ENCOUNTER — Other Ambulatory Visit: Payer: Self-pay | Admitting: Internal Medicine

## 2011-09-15 ENCOUNTER — Encounter: Payer: PRIVATE HEALTH INSURANCE | Admitting: Internal Medicine

## 2011-09-21 ENCOUNTER — Other Ambulatory Visit: Payer: Self-pay | Admitting: Cardiovascular Disease

## 2011-09-22 ENCOUNTER — Ambulatory Visit (INDEPENDENT_AMBULATORY_CARE_PROVIDER_SITE_OTHER): Payer: PRIVATE HEALTH INSURANCE | Admitting: Internal Medicine

## 2011-09-22 ENCOUNTER — Encounter: Payer: Self-pay | Admitting: Internal Medicine

## 2011-09-22 VITALS — BP 124/72 | HR 86 | Temp 97.6°F | Ht 64.75 in | Wt 206.0 lb

## 2011-09-22 DIAGNOSIS — R159 Full incontinence of feces: Secondary | ICD-10-CM

## 2011-09-22 DIAGNOSIS — D509 Iron deficiency anemia, unspecified: Secondary | ICD-10-CM

## 2011-09-22 DIAGNOSIS — I10 Essential (primary) hypertension: Secondary | ICD-10-CM

## 2011-09-22 DIAGNOSIS — E119 Type 2 diabetes mellitus without complications: Secondary | ICD-10-CM

## 2011-09-22 DIAGNOSIS — M549 Dorsalgia, unspecified: Secondary | ICD-10-CM

## 2011-09-22 DIAGNOSIS — G4700 Insomnia, unspecified: Secondary | ICD-10-CM

## 2011-09-22 DIAGNOSIS — G4730 Sleep apnea, unspecified: Secondary | ICD-10-CM

## 2011-09-22 DIAGNOSIS — M1990 Unspecified osteoarthritis, unspecified site: Secondary | ICD-10-CM

## 2011-09-22 DIAGNOSIS — G8929 Other chronic pain: Secondary | ICD-10-CM

## 2011-09-22 DIAGNOSIS — F329 Major depressive disorder, single episode, unspecified: Secondary | ICD-10-CM

## 2011-09-22 LAB — GLUCOSE, CAPILLARY: Glucose-Capillary: 75 mg/dL (ref 70–99)

## 2011-09-22 LAB — BASIC METABOLIC PANEL
CO2: 24 mEq/L (ref 19–32)
Calcium: 9.7 mg/dL (ref 8.4–10.5)
Chloride: 104 mEq/L (ref 96–112)
Glucose, Bld: 90 mg/dL (ref 70–99)
Potassium: 4.5 mEq/L (ref 3.5–5.3)
Sodium: 140 mEq/L (ref 135–145)

## 2011-09-22 MED ORDER — HYDROCODONE-ACETAMINOPHEN 10-500 MG PO TABS: 1.0000 | ORAL_TABLET | Freq: Four times a day (QID) | ORAL | Status: AC | PRN

## 2011-09-22 NOTE — Assessment & Plan Note (Signed)
Lab Results  Component Value Date   HGBA1C 7.7 09/22/2011   HGBA1C 6.2 11/20/2010   CREATININE 1.48* 07/14/2011   CREATININE 1.64* 08/12/2010   MICROALBUR 6.41* 05/25/2007   MICRALBCREAT 148.7* 05/25/2007   CHOL 86 12/04/2010   HDL 23* 12/04/2010   TRIG 113 12/04/2010    Last eye exam and foot exam: Eye exam--> 07/2011, Foot exam --> today  Assessment: Diabetes control: not controlled Progress toward goals: deteriorated Barriers to meeting goals: Worse diet recently, has started eating more sugar (cobbler, regular ice cream)  Plan: Diabetes treatment: continue current medications Refer to: none Instruction/counseling given: other instruction/counseling: Patient has agreed to try to go back to sugar free foods.  She has also agreed to try to start attending silver sneakers again in addition to her community's exercise program.  BMET checked today to evaluate Cr function, pt may need to discontinue metformin

## 2011-09-22 NOTE — Patient Instructions (Signed)
You're doing a great job with your medications! Keep it up!!!  Please return stool cards when you have completed them.  Please attend your community's exercise program as well as silver sneakers.. It will be fun!  Try to cut back on the sugar.

## 2011-09-22 NOTE — Assessment & Plan Note (Signed)
Pt is interested in restarting CPAP.  She has not been sleeping well.  She no longer stays in same room as her husband, who made her d/c CPAP in 2005 because of noise.  Pt referred for split night sleep study.

## 2011-09-22 NOTE — Assessment & Plan Note (Signed)
Back pain has been bothering her more lately with standing for long periods.  I encouraged exercise/weight loss to help with central obesity that I'm sure is contributing to pain.  I also changed pain medication from oxycodone-APAP to hydrocodone-APAP as I do not suspect she is abusing narcotics (last script filled in August #60, and she still has 3tabs remaining), and this will allow for more convenient refills.

## 2011-09-22 NOTE — Assessment & Plan Note (Signed)
Denies SI/HI.  Has had more feelings of apathy recently, and is open to discussing them.  She has been to therapist last month, and has another appt next month.  She is also compliant with Wellbutrin & Celexa.  She is taking valium for anxiety (all prescribed by [facility]).  She has discontinued trazodone 2/2 dry mouth and is taking 3g melatonin before bed to help with sleep.  Her therapist asked that she discuss restarting CPAP with me as well as restart silver sneakers, which I reinforced today.

## 2011-09-22 NOTE — Assessment & Plan Note (Signed)
Resolved. Seen by Dr. Matthias Hughs in 07/2011 who suggested Senokot 4 tab qHS and Align probiotic treatment.  Pt notes she has not had episode of fecal incontinence since last visit and is starting to be more comfortable leaving the house.

## 2011-09-22 NOTE — Assessment & Plan Note (Signed)
Asymptomatic. Gave patient stool cards to return.  Will need to check anemia panel at next visit.

## 2011-09-22 NOTE — Progress Notes (Signed)
Subjective:   Patient ID: [name] female   DOB: 29-Aug-1951 60 y.o.   MRN: [id number]  HPI: [name] is a 60 y.o. woman that presents for DM follow up.  She also complaints of insomnia.  Specifically, she notes that she she can fall asleep for a few hours and then wakes up for no reason.  She notes increased nocturia. She thinks her sleep apnea is contributing as well, and is interested in restarting CPAP, suggested by her therapist Wonda Cerise, at [facility]).  She complains of low back pain that has been worsening with standing for long periods.  She has not been going to silver sneakers but is participating in her community's exercise program that is not as rigorous.  She notes increased desserts that are not sugar free.  Does not wake up gasping for air.  Denies fecal incontinence.  Notes almost daily, normal BM with new regimen started by Dr. Matthias Hughs of senokot & align probiotic.  Past Medical History  Diagnosis Date  . Hypertension   . Peripheral neuropathy   . Cervicalgia   . Low back pain syndrome   . Hyperlipidemia   . Vitamin D deficiency   . Vitamin B12 deficiency   . Iliotibial band syndrome   . Anemia   . Borderline personality disorder   . Nonorganic psychosis   . GERD (gastroesophageal reflux disease)   . Diabetes mellitus   . Depression   . Diabetic gastroparesis   . Sleep apnea   . CAD (coronary artery disease)     Catherization 11/18/09>nonobstructive CAD , normal LV function, EF 65%  . Health maintenance examination     Colonoscopy 2009> normal; Diabetic eye exam 12/2008. No diabetic retinopathy; Mammogram 11/10> No evidence of malignancy   Current Outpatient Prescriptions  Medication Sig Dispense Refill  . amLODipine (NORVASC) 5 MG tablet take 1 tablet by mouth once daily  30 tablet  5  . aspirin 81 MG tablet Take 81 mg by mouth daily.        Marland Kitchen buPROPion (WELLBUTRIN XL) 300 MG 24 hr tablet Take 300 mg by mouth daily.        . citalopram (CELEXA) 40 MG  tablet Take 40 mg by mouth daily.        Marland Kitchen dexlansoprazole (DEXILANT) 60 MG capsule Take 60 mg by mouth daily.        . diazepam (VALIUM) 5 MG tablet Take 1 tablet (5 mg total) by mouth every 8 (eight) hours as needed.  90 tablet  2  . gabapentin (NEURONTIN) 800 MG tablet Take 1 tablet (800 mg total) by mouth 3 (three) times daily. Take one tablet by mouth in am and 2 tablets by mouth in pm  90 tablet  11  . isosorbide mononitrate (IMDUR) 60 MG 24 hr tablet take 1 tablet by mouth once daily  30 tablet  2  . losartan-hydrochlorothiazide (HYZAAR) 100-25 MG per tablet take 1 tablet by mouth once daily  30 tablet  5  . Melatonin 1 MG CAPS Take 3 mg by mouth at bedtime as needed.        . metFORMIN (GLUCOPHAGE) 1000 MG tablet Take 1 tablet (1,000 mg total) by mouth 2 (two) times daily with a meal.  60 tablet  11  . metoprolol succinate (TOPROL-XL) 25 MG 24 hr tablet tablet 1/2 tablet by mouth once daily  20 tablet  2  . NOVOLOG MIX 70/30 FLEXPEN (70-30) 100 UNIT/ML injection INJECT 37 UNITS EVERY MORNING  AND 26 UNITS IN THE EVENING  15 Syringe  11  . senna (SENOKOT) 8.6 MG tablet Take 4 tablets by mouth daily.        . simvastatin (ZOCOR) 20 MG tablet take 1 tablet by mouth at bedtime  90 tablet  1  . HYDROcodone-acetaminophen (LORTAB 10) 10-500 MG per tablet Take 1 tablet by mouth every 6 (six) hours as needed for pain.  30 tablet  0   Family History  Problem Relation Age of Onset  . Diabetes Mother   . Hypertension Mother   . Hyperlipidemia Mother   . Arthritis Mother   . Heart disease Father   . Diabetes Sister   . Diabetes Brother    History   Social History  . Marital Status: Married    Spouse Name: N/A    Number of Children: N/A  . Years of Education: N/A   Social History Main Topics  . Smoking status: Former Smoker    Types: Cigarettes    Quit date: 10/25/2002  . Smokeless tobacco: None  . Alcohol Use: No  . Drug Use: No  . Sexually Active: None   Other Topics Concern  .  None   Social History Narrative  . None   Review of Systems: General: no fevers, chills, changes in weight, changes in appetite Skin: no rash HEENT: no blurry vision, hearing changes, sore throat Pulm: no dyspnea, coughing, wheezing CV: no chest pain, palpitations, shortness of breath Abd: no abdominal pain, nausea/vomiting, diarrhea/constipation GU: no dysuria, hematuria, polyuria Ext: no new arthralgias, myalgias Neuro: no weakness, numbness, or tingling  Objective:  Physical Exam: Filed Vitals:   09/22/11 1359  BP: 124/72  Pulse: 86  Temp: 97.6 F (36.4 C)  TempSrc: Oral  Height: 5' 4.75" (1.645 m)  Weight: 206 lb (93.441 kg)   Constitutional: Vital signs reviewed.  Patient is a well-developed and well-nourished woman in no acute distress and cooperative with exam. .  Mouth: no erythema or exudates, MMM Eyes: PERRL, EOMI, conjunctivae normal, No scleral icterus. .  Cardiovascular: RRR, S1 normal, S2 normal, no MRG, pulses symmetric and intact bilaterally Pulmonary/Chest: CTAB, no wheezes, rales, or rhonchi Abdominal: Soft. Non-tender, non-distended, bowel sounds are normal, no masses, organomegaly, or guarding present.  Neurological: A&O x3, Strength is normal and symmetric bilaterally, cranial nerve II-XII are grossly intact, no focal motor deficit, sensory intact to light touch bilaterally.  Skin: Warm, dry and intact. No rash, cyanosis, or clubbing.  Psychiatric: Normal mood and affect. speech and behavior is mildly depressed. Judgment and thought content normal. Cognition and memory are normal.   Assessment & Plan:  Case and care discussed with Dr. Aundria Rud.  Pt to return in 3 month for DM check.

## 2011-09-22 NOTE — Assessment & Plan Note (Signed)
Lab Results  Component Value Date   NA 139 07/14/2011   K 5.0 07/14/2011   CL 102 07/14/2011   CO2 27 07/14/2011   BUN 26* 07/14/2011   CREATININE 1.48* 07/14/2011   CREATININE 1.64* 08/12/2010    BP Readings from Last 3 Encounters:  09/22/11 124/72  07/14/11 153/78  06/02/11 122/73    Assessment: Hypertension control:  controlled  Progress toward goals:  at goal Barriers to meeting goals:  no barriers identified  Plan: Hypertension treatment:  continue current medications

## 2011-09-23 ENCOUNTER — Telehealth: Payer: Self-pay | Admitting: Internal Medicine

## 2011-09-23 ENCOUNTER — Other Ambulatory Visit: Payer: Self-pay

## 2011-09-23 LAB — URINALYSIS
Glucose, UA: NEGATIVE mg/dL
Hgb urine dipstick: NEGATIVE
Leukocytes, UA: NEGATIVE
Nitrite: NEGATIVE
pH: 6 (ref 5.0–8.0)

## 2011-09-23 LAB — MICROALBUMIN / CREATININE URINE RATIO: Microalb Creat Ratio: 31.2 mg/g — ABNORMAL HIGH (ref 0.0–30.0)

## 2011-09-23 NOTE — Progress Notes (Signed)
agree with plans and notes 

## 2011-09-23 NOTE — Telephone Encounter (Signed)
Called pt at 9am on 09/23/11 to ask patient to hold metformin given renal function, but no answer on home or cell phone.  Left msg for pt to call clinic back, she will also need appt in about 1 month to decide how much to increase insulin dose.  She will need to bring glucometer with readings to this visit.

## 2011-09-29 ENCOUNTER — Encounter: Payer: PRIVATE HEALTH INSURANCE | Admitting: Internal Medicine

## 2011-10-09 ENCOUNTER — Other Ambulatory Visit: Payer: Self-pay | Admitting: Internal Medicine

## 2011-10-10 ENCOUNTER — Ambulatory Visit (HOSPITAL_BASED_OUTPATIENT_CLINIC_OR_DEPARTMENT_OTHER): Payer: PRIVATE HEALTH INSURANCE | Attending: Internal Medicine | Admitting: General Practice

## 2011-10-10 VITALS — Ht 64.0 in | Wt 206.0 lb

## 2011-10-10 DIAGNOSIS — G4733 Obstructive sleep apnea (adult) (pediatric): Secondary | ICD-10-CM

## 2011-10-10 DIAGNOSIS — G4730 Sleep apnea, unspecified: Secondary | ICD-10-CM

## 2011-10-13 ENCOUNTER — Ambulatory Visit (HOSPITAL_COMMUNITY)
Admission: RE | Admit: 2011-10-13 | Discharge: 2011-10-13 | Disposition: A | Discharge status: Home / Self Care | Payer: PRIVATE HEALTH INSURANCE | Source: Ambulatory Visit | Attending: Internal Medicine | Admitting: Internal Medicine

## 2011-10-13 ENCOUNTER — Ambulatory Visit (INDEPENDENT_AMBULATORY_CARE_PROVIDER_SITE_OTHER): Payer: PRIVATE HEALTH INSURANCE | Admitting: Internal Medicine

## 2011-10-13 ENCOUNTER — Other Ambulatory Visit: Payer: Self-pay | Admitting: Internal Medicine

## 2011-10-13 ENCOUNTER — Encounter: Payer: Self-pay | Admitting: Internal Medicine

## 2011-10-13 VITALS — BP 127/76 | HR 93 | Temp 98.0°F | Resp 20 | Ht 64.0 in | Wt 204.3 lb

## 2011-10-13 DIAGNOSIS — R05 Cough: Secondary | ICD-10-CM | POA: Insufficient documentation

## 2011-10-13 DIAGNOSIS — R059 Cough, unspecified: Secondary | ICD-10-CM | POA: Insufficient documentation

## 2011-10-13 DIAGNOSIS — R0989 Other specified symptoms and signs involving the circulatory and respiratory systems: Secondary | ICD-10-CM | POA: Insufficient documentation

## 2011-10-13 LAB — GLUCOSE, CAPILLARY: Glucose-Capillary: 100 mg/dL — ABNORMAL HIGH (ref 70–99)

## 2011-10-13 MED ORDER — BENZONATATE 200 MG PO CAPS: 200.0000 mg | ORAL_CAPSULE | Freq: Three times a day (TID) | ORAL | Status: DC | PRN

## 2011-10-13 MED ORDER — AZITHROMYCIN 250 MG PO TABS: ORAL_TABLET | ORAL | Status: DC

## 2011-10-13 NOTE — Progress Notes (Signed)
Subjective:     Patient ID: [name], female   DOB: 04/06/1951, 60 y.o.   MRN: [id number]  HPI  Patient is 60 year old female with a past medical history listed below, presents to the outpatient clinic with complaints of URI symptoms for the past 3 weeks with congestion in her chest and throat, nasal discharge, and cough. Patient has tried over-the-counter preparations such as Mucinex, Alka-Seltzer cold and flu, DayQuil, and other such preparations without significant relief. In the clinic today the patient is afebrile and in no acute distress O2 saturations more than 96% on air. Denies any other complaints  Patient Active Problem List  Diagnoses  . DIABETES MELLITUS, TYPE II  . VITAMIN B12 DEFICIENCY  . VITAMIN D DEFICIENCY  . HYPERLIPIDEMIA  . PERIPHERAL NEUROPATHY  . HYPERTENSION  . CAD  . GERD  . SYMPTOM, APNEA, SLEEP NOS  . DEGENERATIVE JOINT DISEASE, BACK  . Chronic renal insufficiency  . Fecal incontinence  . Anxiety  . Depression  . Constipation  . Microcytic anemia   Current Outpatient Prescriptions on File Prior to Visit  Medication Sig Dispense Refill  . amLODipine (NORVASC) 5 MG tablet take 1 tablet by mouth once daily  30 tablet  5  . aspirin 81 MG tablet Take 81 mg by mouth daily.        Marland Kitchen buPROPion (WELLBUTRIN XL) 300 MG 24 hr tablet Take 300 mg by mouth daily.        . citalopram (CELEXA) 40 MG tablet Take 40 mg by mouth daily.        Marland Kitchen dexlansoprazole (DEXILANT) 60 MG capsule Take 60 mg by mouth daily.        . diazepam (VALIUM) 5 MG tablet Take 1 tablet (5 mg total) by mouth every 8 (eight) hours as needed.  90 tablet  2  . gabapentin (NEURONTIN) 800 MG tablet Take 1 tablet (800 mg total) by mouth 3 (three) times daily. Take one tablet by mouth in am and 2 tablets by mouth in pm  90 tablet  11  . isosorbide mononitrate (IMDUR) 60 MG 24 hr tablet take 1 tablet by mouth once daily  30 tablet  2  . losartan-hydrochlorothiazide (HYZAAR) 100-25 MG per tablet take  1 tablet by mouth once daily  30 tablet  5  . Melatonin 1 MG CAPS Take 3 mg by mouth at bedtime as needed.        . metFORMIN (GLUCOPHAGE) 1000 MG tablet Take 1 tablet (1,000 mg total) by mouth 2 (two) times daily with a meal.  60 tablet  11  . metoprolol succinate (TOPROL-XL) 25 MG 24 hr tablet tablet 1/2 tablet by mouth once daily  20 tablet  2  . NOVOLOG MIX 70/30 FLEXPEN (70-30) 100 UNIT/ML injection INJECT 37 UNITS EVERY MORNING AND 26 UNITS IN THE EVENING  15 Syringe  11  . senna (SENOKOT) 8.6 MG tablet Take 4 tablets by mouth daily.        . simvastatin (ZOCOR) 20 MG tablet take 1 tablet by mouth at bedtime  90 tablet  1   Allergies  Allergen Reactions  . Indocin Diarrhea    Caused severe diarrhea with episodes of incontinance  . Penicillins Hives  . Cimetidine     Breast swelling  . Sulfonamide Derivatives     unknown     Review of Systems  Respiratory: Positive for cough.   All other systems reviewed and are negative.  Objective:   Physical Exam  Nursing note and vitals reviewed. Constitutional: She is oriented to person, place, and time. She appears well-developed and well-nourished.  HENT:  Head: Normocephalic and atraumatic.  Eyes: Pupils are equal, round, and reactive to light.  Neck: Normal range of motion. Neck supple. No JVD present. No thyromegaly present.  Cardiovascular: Normal rate, regular rhythm and normal heart sounds.   No murmur heard. Pulmonary/Chest: Effort normal and breath sounds normal. She has no wheezes. She has no rales.  Abdominal: Soft. Bowel sounds are normal.  Musculoskeletal: Normal range of motion. She exhibits no edema.  Neurological: She is alert and oriented to person, place, and time.  Skin: Skin is warm and dry.

## 2011-10-13 NOTE — Patient Instructions (Signed)

## 2011-10-13 NOTE — Assessment & Plan Note (Signed)
Given the symptoms have been ongoing for the past 3 weeks I recommended that the patient get a chest x-ray today, also prescribe her Tessalon Perles and azithromycin pack given the chronicity of her presentation. Advised the patient to return if symptoms are not improved within the next 2 weeks

## 2011-10-14 ENCOUNTER — Telehealth: Payer: Self-pay | Admitting: *Deleted

## 2011-10-14 ENCOUNTER — Other Ambulatory Visit: Payer: Self-pay | Admitting: Internal Medicine

## 2011-10-14 DIAGNOSIS — R05 Cough: Secondary | ICD-10-CM

## 2011-10-14 MED ORDER — GUAIFENESIN-CODEINE 100-10 MG/5ML PO SYRP: 5.0000 mL | ORAL_SOLUTION | Freq: Three times a day (TID) | ORAL | Status: AC | PRN

## 2011-10-14 NOTE — Telephone Encounter (Signed)
I have called the patient informed of her results, I have entered in Epic a prescription for cheratussin, it was entered as a phone in, with nurse to complete. Thank you

## 2011-10-14 NOTE — Telephone Encounter (Signed)
Pt calls and states her insurance would not pay for the tessalon also she would like for you to call her about the chest xray at the ph# on the chart

## 2011-10-16 DIAGNOSIS — G4733 Obstructive sleep apnea (adult) (pediatric): Secondary | ICD-10-CM

## 2011-10-17 NOTE — Procedures (Signed)
NAME:  [name], [name] NO.:  [id number]  MEDICAL RECORD NO.:  [id number]          PATIENT TYPE:  OUT  LOCATION:  SLEEP CENTER                 FACILITY:  [facility]  PHYSICIAN:  Demarion Pondexter D. Maple Hudson, MD, FCCP, FACPDATE OF BIRTH:  01/[birth year]1950  DATE OF STUDY:  10/10/2011                           NOCTURNAL POLYSOMNOGRAM  REFERRING PHYSICIAN:  Vernice Jefferson, MD  REFERRING PRACTITIONER:  Vernice Jefferson, MD  INDICATION FOR STUDY:  Hypersomnia with sleep apnea.  EPWORTH SLEEPINESS SCORE:  7/24.  BMI 35.4, weight 206 pounds, height 64 inches, neck 16 inches.  MEDICATIONS:  Home medications are charted and reviewed.  A baseline diagnostic NPSG on February 05, 2004, recorded AHI 20 per hour with CPAP titration to 17 CWP.  She weighed 195 pounds.  A CPAP titration study on September 10, 2008, recorded CPAP 20, AHI 0 per hour. She weighed 186 pounds.  SLEEP ARCHITECTURE:  Total sleep time 265.5 minutes with sleep efficiency 62.7%.  Stage I was 20.5%, stage II 79.5%, stages III and REM were absent.  Sleep latency 68 minutes, REM latency NA, awake after sleep onset 90 minutes, arousal index 16 minutes.  Bedtime medication: Melatonin.  RESPIRATORY DATA:  Apnea/hypotony index (AHI) 9.3 per hour.  A total of 41 events was scored including 16 obstructive apneas and 25 hypopneas. Events were not positional.  There were insufficient numbers of early events to allow application of split protocol, CPAP titration on this study night.  OXYGEN DATA:  Moderately loud snoring with oxygen desaturation to a nadir of 81% and a mean oxygen saturation through the study of 92.1% on room air.  CARDIAC DATA:  Normal sinus rhythm.  MOVEMENT-PARASOMNIA:  No significant movement disturbance.  Bathroom x2.  IMPRESSIONS-RECOMMENDATIONS: 1. Sleep architecture was fragmented with absence of deeper sleep     stages and REM. 2. Mild obstructive sleep apnea/hypopnea syndrome, AHI of 9.3 per     hour.   Non-positional events with moderately loud snoring and     oxygen desaturation to a nadir of 81% and mean saturation through     the study of 92.1% on room air. 3. She did not have enough respiratory events within the first hours     of the study to permit application of split protocol, CPAP     titration on this study night. 4. If appropriate, she can return for dedicated CPAP titration study.     Review of symptoms suggests that an insomnia component may have     become important now and may contribute to lack of time spent in     stage III and REM.  This would also contribute to less time spent     in sleep stages most likely to generate apnea events.     Raphaela Cannaday D. Maple Hudson, MD, [employer], FACP Diplomate, Biomedical engineer of Sleep Medicine Electronically Signed    CDY/MEDQ  D:  10/16/2011 10:23:13  T:  10/17/2011 03:20:12  Job:  [id number]

## 2011-10-27 ENCOUNTER — Ambulatory Visit (INDEPENDENT_AMBULATORY_CARE_PROVIDER_SITE_OTHER): Payer: PRIVATE HEALTH INSURANCE | Admitting: Internal Medicine

## 2011-10-27 ENCOUNTER — Encounter: Payer: Self-pay | Admitting: Internal Medicine

## 2011-10-27 VITALS — BP 118/76 | HR 97 | Temp 97.4°F | Wt 205.4 lb

## 2011-10-27 DIAGNOSIS — F419 Anxiety disorder, unspecified: Secondary | ICD-10-CM

## 2011-10-27 DIAGNOSIS — E119 Type 2 diabetes mellitus without complications: Secondary | ICD-10-CM

## 2011-10-27 DIAGNOSIS — N189 Chronic kidney disease, unspecified: Secondary | ICD-10-CM

## 2011-10-27 DIAGNOSIS — G4730 Sleep apnea, unspecified: Secondary | ICD-10-CM

## 2011-10-27 DIAGNOSIS — F411 Generalized anxiety disorder: Secondary | ICD-10-CM

## 2011-10-27 DIAGNOSIS — G4700 Insomnia, unspecified: Secondary | ICD-10-CM

## 2011-10-27 DIAGNOSIS — F329 Major depressive disorder, single episode, unspecified: Secondary | ICD-10-CM

## 2011-10-27 DIAGNOSIS — M1990 Unspecified osteoarthritis, unspecified site: Secondary | ICD-10-CM

## 2011-10-27 DIAGNOSIS — G609 Hereditary and idiopathic neuropathy, unspecified: Secondary | ICD-10-CM

## 2011-10-27 MED ORDER — HYDROCODONE-ACETAMINOPHEN 10-500 MG PO TABS: 1.0000 | ORAL_TABLET | Freq: Four times a day (QID) | ORAL | Status: AC | PRN

## 2011-10-27 MED ORDER — ZOLPIDEM TARTRATE 5 MG PO TABS: 5.0000 mg | ORAL_TABLET | Freq: Every evening | ORAL | Status: DC | PRN

## 2011-10-27 MED ORDER — INSULIN ASPART PROT & ASPART (70-30 MIX) 100 UNIT/ML ~~LOC~~ SUSP: SUBCUTANEOUS | Status: DC

## 2011-10-27 MED ORDER — METOPROLOL SUCCINATE ER 25 MG PO TB24: 25.0000 mg | ORAL_TABLET | Freq: Every day | ORAL | Status: DC

## 2011-10-27 NOTE — Assessment & Plan Note (Signed)
Lab Results  Component Value Date   HGBA1C 7.7 09/22/2011   HGBA1C 6.2 11/20/2010   CREATININE 1.71* 09/22/2011   CREATININE 1.64* 08/12/2010   MICROALBUR 1.74 09/22/2011   MICRALBCREAT 31.2* 09/22/2011   CHOL 86 12/04/2010   HDL 23* 12/04/2010   TRIG 113 12/04/2010    Last eye exam and foot exam: Patient is up to date (within the last year)   Assessment: Diabetes control: not controlled Progress toward goals: unable to assess Barriers to meeting goals: lack of understanding of disease management  Plan: Diabetes treatment: Discontinue metformin, increase AM insulin to 40u and PM insulin to 30u; Patient should record CBGs 2-3x/day (at the very least, before breakfast and before bed) so that we can better manage her insulin.  I suspect worsening DM may be contributing to numbness in fingers, and pt is already on a significant gabapentin dose, so better control should first try to be achieved.  She has not been eating well (1 meal/day with junkfood) and will benefit from a visit with Norm Parcel (DM education); She also notes that she will start exercising again next week. Refer to: diabetes educator for medical nutrition therapy Instruction/counseling given: reminded to bring blood glucose meter & log to each visit, reminded to bring medications to each visit, discussed the need for weight loss and discussed diet

## 2011-10-27 NOTE — Assessment & Plan Note (Signed)
Refilled lortab 10-500, q6h prn pain #30

## 2011-10-27 NOTE — Progress Notes (Signed)
I discussed patient's care with resident Dr. Kapadia.  I agree with the clinical findings and plans as outlined in her note. 

## 2011-10-27 NOTE — Assessment & Plan Note (Addendum)
Recommendations from sleep study are as follows:  1. Sleep architecture was fragmented with absence of deeper sleep  stages and REM.  2. Mild obstructive sleep apnea/hypopnea syndrome, AHI of 9.3 per  hour. Non-positional events with moderately loud snoring and  oxygen desaturation to a nadir of 81% and mean saturation through  the study of 92.1% on room air.  3. She did not have enough respiratory events within the first hours  of the study to permit application of split protocol, CPAP  titration on this study night.  4. If appropriate, she can return for dedicated CPAP titration study.  Review of symptoms suggests that an insomnia component may have  become important now and may contribute to lack of time spent in  stage III and REM. This would also contribute to less time spent  in sleep stages most likely to generate apnea events.   Patient would like to try low dose ambien and then consider CPAP titration study upon follow up in 2 months. She has tried trazodone in the past, which she has discontinued 2/2 dry mouth.

## 2011-10-27 NOTE — Assessment & Plan Note (Signed)
Lab closed when pt left.  Pt to return for BMET in the next few days to assess renal function.

## 2011-10-27 NOTE — Assessment & Plan Note (Signed)
Seems to be worsening.  Already on high dose gabapentin, which is also likely contributing to fatigue.  Will try to attain better sugar control.

## 2011-10-27 NOTE — Patient Instructions (Addendum)
-  Please take 40u insulin in the morning, and 30u insulin in the evening. Please discontinue metformin.  -Please take blood sugar 2-3x/day, at least once before breakfast, and once before bed  -If you experience any symptoms of hypoglycemia (dizziness, headache, heart palpitations, sweats), please check your blood sugar and call the clinic at [phone].  -Begin Ambien 5mg  daily, if you feel too fatigued in the morning, may try cutting pill in half.    -Let's try to get your blood sugars under control because I think that will help the numbness/tingling in your fingers.  -Please make an appointment to see Lupita Leash Plyler for your diabetes control.    -Please bring all medications and your glucometer to all clinic appointments.  -Please return in about 1 week, at your convenience for blood draw.

## 2011-10-27 NOTE — Assessment & Plan Note (Signed)
Patient is doing well in therapy.  Husband is to attend next session with her.  She is tolerating wellbutrin and celexa well.

## 2011-10-27 NOTE — Progress Notes (Signed)
Subjective:   Patient ID: [name] female   DOB: 1951-04-05 61 y.o.   MRN: [id number]  HPI: [name] is a 61 y.o. woman who comes today for DM follow up.  She was seen in Nov 2012, and Cr was elevated at 1.71.  I called her to discontinue metformin, but she has not done so.  She states she did not understand the message and when she called the clinic, she was unable to get in touch with me.  Regarding her DM, she also reports poor eating habits still (regular ice cream instead of sugar free).  She has recently also noticed nausea after each meal, but reports that she has regular daily BMs while taking align and senakot, and has not had any episodes of incontinence.  She notes 1 meal a day (dinner) and has junk food during the rest of the day.  She is open to visiting a diabetic educator.  She endorses increased thirst and urination.  Finally she c/o worsening numbness of fingers.   She notes increased fatigue, especially after the holidays.  She has recovered from an URI around Christmas time, for which she was seen in clinic. She also had a sleep study, which suggested mild OSA but needs to have titration study done.  She has been having a hard time falling asleep (which is in keeping with sleep study results).  Today she c/o worsening back pain because she has had to help her mom change homes.  She notes currently pain is 8/10, "pulling" and she has been taking 1-2 pills (lortab)/day.   Past Medical History  Diagnosis Date  . Hypertension   . Peripheral neuropathy   . Cervicalgia   . Low back pain syndrome   . Hyperlipidemia   . Vitamin D deficiency   . Vitamin B12 deficiency   . Iliotibial band syndrome   . Anemia   . Borderline personality disorder   . Nonorganic psychosis   . GERD (gastroesophageal reflux disease)   . Diabetes mellitus   . Depression   . Diabetic gastroparesis   . Sleep apnea   . CAD (coronary artery disease)     Catherization 11/18/09>nonobstructive  CAD , normal LV function, EF 65%  . Health maintenance examination     Colonoscopy 2009> normal; Diabetic eye exam 12/2008. No diabetic retinopathy; Mammogram 11/10> No evidence of malignancy   Current Outpatient Prescriptions  Medication Sig Dispense Refill  . amLODipine (NORVASC) 5 MG tablet take 1 tablet by mouth once daily  30 tablet  5  . aspirin 81 MG tablet Take 81 mg by mouth daily.        . bifidobacterium infantis (ALIGN) capsule Take 1 capsule by mouth daily.        Marland Kitchen buPROPion (WELLBUTRIN XL) 300 MG 24 hr tablet Take 300 mg by mouth daily.        . citalopram (CELEXA) 40 MG tablet Take 40 mg by mouth daily.        Marland Kitchen dexlansoprazole (DEXILANT) 60 MG capsule Take 60 mg by mouth daily.        . diazepam (VALIUM) 5 MG tablet Take 1 tablet (5 mg total) by mouth every 8 (eight) hours as needed.  90 tablet  2  . gabapentin (NEURONTIN) 800 MG tablet Take 1 tablet (800 mg total) by mouth 3 (three) times daily. Take one tablet by mouth in am and 2 tablets by mouth in pm  90 tablet  11  . insulin  aspart protamine-insulin aspart (NOVOLOG MIX 70/30 FLEXPEN) (70-30) 100 UNIT/ML injection INJECT 40 UNITS EVERY MORNING AND 30 UNITS IN THE EVENING  3 mL  5  . isosorbide mononitrate (IMDUR) 60 MG 24 hr tablet take 1 tablet by mouth once daily  30 tablet  2  . losartan-hydrochlorothiazide (HYZAAR) 100-25 MG per tablet take 1 tablet by mouth once daily  30 tablet  5  . Melatonin 1 MG CAPS Take 3 mg by mouth at bedtime as needed.        . senna (SENOKOT) 8.6 MG tablet Take 4 tablets by mouth daily.        . simvastatin (ZOCOR) 20 MG tablet take 1 tablet by mouth at bedtime  90 tablet  1  . DISCONTD: NOVOLOG MIX 70/30 FLEXPEN (70-30) 100 UNIT/ML injection INJECT 37 UNITS EVERY MORNING AND 26 UNITS IN THE EVENING  15 Syringe  11  . HYDROcodone-acetaminophen (LORTAB 10) 10-500 MG per tablet Take 1 tablet by mouth every 6 (six) hours as needed for pain.  30 tablet  0  . metoprolol succinate (TOPROL-XL) 25 MG  24 hr tablet Take 1 tablet (25 mg total) by mouth daily.  30 tablet  2  . zolpidem (AMBIEN) 5 MG tablet Take 1 tablet (5 mg total) by mouth at bedtime as needed for sleep.  30 tablet  0   Family History  Problem Relation Age of Onset  . Diabetes Mother   . Hypertension Mother   . Hyperlipidemia Mother   . Arthritis Mother   . Heart disease Father   . Diabetes Sister   . Diabetes Brother    History   Social History  . Marital Status: Married    Spouse Name: N/A    Number of Children: N/A  . Years of Education: N/A   Social History Main Topics  . Smoking status: Former Smoker    Types: Cigarettes    Quit date: 10/25/2002  . Smokeless tobacco: None  . Alcohol Use: No  . Drug Use: No  . Sexually Active: None   Other Topics Concern  . None   Social History Narrative  . None   Review of Systems: Constitutional: Denies fever, chills, diaphoresis, appetite change   HEENT: Denies photophobia, eye pain, redness, hearing loss, ear pain, congestion, sore throat, rhinorrhea, sneezing, mouth sores, trouble swallowing, neck pain, neck stiffness and tinnitus.   Respiratory: Denies SOB, DOE, cough, chest tightness,  and wheezing.   Cardiovascular: Denies chest pain, palpitations and leg swelling.  Gastrointestinal: Denies vomiting, abdominal pain, diarrhea, constipation, blood in stool and abdominal distention.  Genitourinary: Denies dysuria, urgency, frequency, hematuria, flank pain and difficulty urinating.  Musculoskeletal: Denies myalgias, joint swelling, arthralgias and gait problem.  Skin: Denies pallor, rash and wound.  Neurological: Denies dizziness, seizures, syncope, weakness, light-headedness, numbness and headaches.  Psychiatric/Behavioral: Denies suicidal ideation, mood changes, confusion, nervousness, sleep disturbance and agitation  Objective:  Physical Exam: Filed Vitals:   10/27/11 1545  BP: 118/76  Pulse: 97  Temp: 97.4 F (36.3 C)  TempSrc: Oral  Weight: 205  lb 6.4 oz (93.169 kg)   Constitutional: Vital signs reviewed.  Patient is a well-developed and well-nourished woman in no acute distress and cooperative with exam  Mouth: no erythema or exudates, MMM Eyes: PERRL, EOMI, conjunctivae normal, No scleral icterus.  Neck: Supple, Trachea midline normal ROM, No JVD, mass, thyromegaly, or carotid bruit present.  Cardiovascular: RRR, S1 normal, S2 normal, no MRG, pulses symmetric and intact bilaterally Pulmonary/Chest: CTAB,  no wheezes, rales, or rhonchi Abdominal: Soft. Non-tender, non-distended, bowel sounds are normal, no masses, organomegaly, or guarding present.  Neurological: A&O x3, Strength is normal and symmetric bilaterally, cranial nerve II-XII are grossly intact, no focal motor deficit, sensory intact to light touch bilaterally.  Skin: Warm, dry and intact. No rash, cyanosis, or clubbing.  Psychiatric: Normal mood and affect. speech and behavior is normal. Judgment and thought content normal. Cognition and memory are normal.   Assessment & Plan:  Case and care discussed with Dr. Meredith Pel.  Please see problem oriented charting for further detail.  Lab was closed when patient left, so she was instructed to return to the clinic in the next few days at her convenience for BMET.  She will return at the end of February for DM follow up.

## 2011-10-27 NOTE — Assessment & Plan Note (Signed)
Stable with valium, wellbutrin & celexa.  Undergoing therapy.

## 2011-11-17 ENCOUNTER — Other Ambulatory Visit (INDEPENDENT_AMBULATORY_CARE_PROVIDER_SITE_OTHER): Payer: PRIVATE HEALTH INSURANCE

## 2011-11-17 DIAGNOSIS — E119 Type 2 diabetes mellitus without complications: Secondary | ICD-10-CM

## 2011-11-17 LAB — BASIC METABOLIC PANEL
BUN: 19 mg/dL (ref 6–23)
Chloride: 105 mEq/L (ref 96–112)
Creat: 1.69 mg/dL — ABNORMAL HIGH (ref 0.50–1.10)
Potassium: 4.6 mEq/L (ref 3.5–5.3)

## 2011-12-09 ENCOUNTER — Ambulatory Visit: Payer: PRIVATE HEALTH INSURANCE | Admitting: Dietician

## 2011-12-14 ENCOUNTER — Ambulatory Visit: Payer: PRIVATE HEALTH INSURANCE | Admitting: Dietician

## 2011-12-15 ENCOUNTER — Ambulatory Visit (INDEPENDENT_AMBULATORY_CARE_PROVIDER_SITE_OTHER): Payer: PRIVATE HEALTH INSURANCE | Admitting: Dietician

## 2011-12-15 ENCOUNTER — Ambulatory Visit: Payer: PRIVATE HEALTH INSURANCE | Admitting: Dietician

## 2011-12-15 VITALS — Ht 64.75 in | Wt 205.8 lb

## 2011-12-15 DIAGNOSIS — E119 Type 2 diabetes mellitus without complications: Secondary | ICD-10-CM

## 2011-12-15 NOTE — Progress Notes (Signed)
Medical Nutrition Therapy:  Appt start time: 1130 end time:  1230.  Assessment:  Primary concerns today: Blood sugar control Usual eating pattern includes Meal 2 and 2+ large snacks per day.   Meals at 1-3 PM and 4-8 PM.Takes am insulin at 8 am regardless of not food intake and takes PM insulin after start of dinner.  Patient drinks large amount of black coffee and diet pepsi. Has sleeping problems, takes naps daily. Husband present and somewhat supportive. Avoided foods include: soda.-regular   Usual physical activity includes doing aerobic activity 30 minutes a day 3 days per week  Diagnosis and Intervention:  Progress Towards Goal(s):  In progress   Nutritional Diagnosis:  NB-1.1 Food and nutrition-related knowledge deficit as related to not taking insulin at correct times as evidenced by her report and high blood sugars and report of symptoms of low blood sugars and A1C not correlating to meter results with glucose average of 354.  NB-1.4 Self-monitoring deficit As related to not chekcing blood sugar when having symptoms of high and low blood sugar.  As evidenced by her report.  NI-5.8.4 Inconsistent carbohydrate intake As related to skips breakfast and eats two very large meals midday and in PM.  As evidenced by her food recall.  NI-5.6.3 Inappropriate intake of fats (specify): excess from fried foods and sweets As related to insulin resistance and high blood sugars.  As evidenced by high blood sugars and excess body weight.      Interventions: 1- Education about insulin action and timing 2- Education about carb counting 3- Counseling about support and behavior change process of diabetes self managment. 4- Education about signs and symptoms of high and low blood sugar and how to use self monitoring 5- Education about effects of fat(s) on insulin resistance.   Monitoring/Evaluation:  Dietary intake and meter download  in 4 week(s)

## 2011-12-15 NOTE — Patient Instructions (Signed)
Please make a follow up in 3 weeks  Between 5 and 9 am Check blood sugar    Take insulin     Please eat something for breakfast 15-30 minutes after insulin   Between 5 and 7 Pm Check blood sugar    Take insulin     Please eat something for breakfast 15-30 minutes after insulin   Try to eat balanced meals with less fats Can eat sweets instead of starches  When you get really hungry, check blood sugar and refer to book for what to do

## 2011-12-22 ENCOUNTER — Other Ambulatory Visit: Payer: Self-pay | Admitting: Cardiovascular Disease

## 2011-12-31 ENCOUNTER — Encounter: Payer: Self-pay | Admitting: Internal Medicine

## 2011-12-31 ENCOUNTER — Ambulatory Visit (INDEPENDENT_AMBULATORY_CARE_PROVIDER_SITE_OTHER): Payer: PRIVATE HEALTH INSURANCE | Admitting: Internal Medicine

## 2011-12-31 VITALS — BP 120/67 | HR 69 | Temp 97.8°F | Wt 210.6 lb

## 2011-12-31 DIAGNOSIS — M1990 Unspecified osteoarthritis, unspecified site: Secondary | ICD-10-CM

## 2011-12-31 DIAGNOSIS — D509 Iron deficiency anemia, unspecified: Secondary | ICD-10-CM

## 2011-12-31 DIAGNOSIS — I10 Essential (primary) hypertension: Secondary | ICD-10-CM

## 2011-12-31 DIAGNOSIS — D649 Anemia, unspecified: Secondary | ICD-10-CM

## 2011-12-31 DIAGNOSIS — Z79899 Other long term (current) drug therapy: Secondary | ICD-10-CM

## 2011-12-31 DIAGNOSIS — M549 Dorsalgia, unspecified: Secondary | ICD-10-CM

## 2011-12-31 DIAGNOSIS — E785 Hyperlipidemia, unspecified: Secondary | ICD-10-CM

## 2011-12-31 DIAGNOSIS — E119 Type 2 diabetes mellitus without complications: Secondary | ICD-10-CM

## 2011-12-31 LAB — CBC
Hemoglobin: 10.2 g/dL — ABNORMAL LOW (ref 12.0–15.0)
MCH: 24.2 pg — ABNORMAL LOW (ref 26.0–34.0)
RBC: 4.22 MIL/uL (ref 3.87–5.11)

## 2011-12-31 LAB — COMPREHENSIVE METABOLIC PANEL
Albumin: 4.1 g/dL (ref 3.5–5.2)
Alkaline Phosphatase: 84 U/L (ref 39–117)
BUN: 25 mg/dL — ABNORMAL HIGH (ref 6–23)
Calcium: 8.9 mg/dL (ref 8.4–10.5)
Glucose, Bld: 256 mg/dL — ABNORMAL HIGH (ref 70–99)
Potassium: 5 mEq/L (ref 3.5–5.3)

## 2011-12-31 LAB — LIPID PANEL
Cholesterol: 150 mg/dL (ref 0–200)
HDL: 31 mg/dL — ABNORMAL LOW (ref >39)
Triglycerides: 475 mg/dL — ABNORMAL HIGH (ref <150)

## 2011-12-31 LAB — ANEMIA PANEL
Iron: 56 ug/dL (ref 42–145)
Retic Ct Pct: 1.9 % (ref 0.4–2.3)
TIBC: 413 ug/dL (ref 250–470)
UIBC: 357 ug/dL (ref 125–400)
Vitamin B-12: 497 pg/mL (ref 211–911)

## 2011-12-31 LAB — POCT GLYCOSYLATED HEMOGLOBIN (HGB A1C): Hemoglobin A1C: 10.9

## 2011-12-31 LAB — GLUCOSE, CAPILLARY: Glucose-Capillary: 268 mg/dL — ABNORMAL HIGH (ref 70–99)

## 2011-12-31 MED ORDER — FERROUS SULFATE 325 (65 FE) MG PO TABS: 325.0000 mg | ORAL_TABLET | Freq: Every day | ORAL | Status: DC

## 2011-12-31 MED ORDER — CYCLOBENZAPRINE HCL 10 MG PO TABS: 10.0000 mg | ORAL_TABLET | Freq: Three times a day (TID) | ORAL | Status: DC | PRN

## 2011-12-31 MED ORDER — METOPROLOL SUCCINATE ER 25 MG PO TB24: 25.0000 mg | ORAL_TABLET | Freq: Every day | ORAL | Status: DC

## 2011-12-31 MED ORDER — INSULIN ASPART PROT & ASPART (70-30 MIX) 100 UNIT/ML ~~LOC~~ SUSP: SUBCUTANEOUS | Status: DC

## 2011-12-31 MED ORDER — DIAZEPAM 5 MG PO TABS: 5.0000 mg | ORAL_TABLET | Freq: Three times a day (TID) | ORAL | Status: DC | PRN

## 2011-12-31 NOTE — Assessment & Plan Note (Signed)
Hyperlipidemia last LDL was 40 on simvastatin 20 mg, this is at goal, will recheck fasting lipid today.

## 2011-12-31 NOTE — Assessment & Plan Note (Signed)
Worse secondary to increased physical activity and high impact sports and gym, avoid to refrain from that and pursue low-impact sports such as cycling or swimming. We'll prescribe Flexeril and Valium as needed. He also has prescription for when necessary Vicodin

## 2011-12-31 NOTE — Assessment & Plan Note (Addendum)
patient had an anemia panel in 2009, which was consistent with iron deficiency anemia with a possible component of chronic disease anemia, had a colonoscopy and EGD which were negative for any pathology. will check another anemia panel and give the patient a prescription for iron supplementation. If the patient continues to be anemic consider further workup in the future.

## 2011-12-31 NOTE — Patient Instructions (Signed)
Please refrain from high-impact physical activity.  Increase your morning insulin to 50 units and evening insulin to 35 units.

## 2011-12-31 NOTE — Assessment & Plan Note (Addendum)
Will increase patient's insulin to 50 units in the morning and 35 units at night, continue to encourage CBG checks 3 times a day. We'll followup again in 3 months Instruction/counseling given: reminded to bring blood glucose meter & log to each visit, reminded to bring medications to each visit, discussed the need for healthy exercise and diet regiment

## 2011-12-31 NOTE — Progress Notes (Signed)
Subjective:     Patient ID: [name], female   DOB: [birth year], 61 y.o.   MRN: [id number]  HPI Patient is a 61 y.o. female with past mental history significant for depression, diabetes, hypertension, hyperlipidemia, peripheral neuropathy, and chronic kidney disease Who comes today for DM follow up. Patient was recently discontinued from her metformin to 2 chronic kidney disease. Her insulin was increased. Patient has been checking her sugars upon analysis they show average CBG at around 400 with no lows, and some days as high as 500. Patient is compliant with her insulin. Also reports back and knee pain, upon further investigation patient is doing high impact sports in the gym which could be contributing to this worsening of pain. No other complaints today  Review of Systems  All other systems reviewed and are negative.       Objective:   Physical Exam  Nursing note and vitals reviewed. Constitutional: She is oriented to person, place, and time. She appears well-developed and well-nourished.  HENT:  Head: Normocephalic and atraumatic.  Eyes: Pupils are equal, round, and reactive to light.  Neck: Normal range of motion. Neck supple. No JVD present. No thyromegaly present.  Cardiovascular: Normal rate, regular rhythm and normal heart sounds.   No murmur heard. Pulmonary/Chest: Effort normal and breath sounds normal. She has no wheezes. She has no rales.  Abdominal: Soft. Bowel sounds are normal.  Musculoskeletal: Normal range of motion. She exhibits no edema.  Neurological: She is alert and oriented to person, place, and time.  Skin: Skin is warm and dry.     Patient Active Problem List  Diagnoses  . DIABETES MELLITUS, TYPE II  . VITAMIN B12 DEFICIENCY  . VITAMIN D DEFICIENCY  . HYPERLIPIDEMIA  . PERIPHERAL NEUROPATHY  . HYPERTENSION  . CAD  . GERD  . SYMPTOM, APNEA, SLEEP NOS  . DEGENERATIVE JOINT DISEASE, BACK  . Chronic renal insufficiency  . Anxiety  . Depression    . Constipation  . Microcytic anemia     Current Outpatient Prescriptions on File Prior to Visit  Medication Sig Dispense Refill  . amLODipine (NORVASC) 5 MG tablet take 1 tablet by mouth once daily  30 tablet  5  . aspirin 81 MG tablet Take 81 mg by mouth daily.        . bifidobacterium infantis (ALIGN) capsule Take 1 capsule by mouth daily.        Marland Kitchen buPROPion (WELLBUTRIN XL) 300 MG 24 hr tablet Take 300 mg by mouth daily.        . citalopram (CELEXA) 40 MG tablet Take 40 mg by mouth daily.        Marland Kitchen dexlansoprazole (DEXILANT) 60 MG capsule Take 60 mg by mouth daily.        . diazepam (VALIUM) 5 MG tablet Take 1 tablet (5 mg total) by mouth every 8 (eight) hours as needed.  90 tablet  2  . gabapentin (NEURONTIN) 800 MG tablet Take 1 tablet (800 mg total) by mouth 3 (three) times daily. Take one tablet by mouth in am and 2 tablets by mouth in pm  90 tablet  11  . insulin aspart protamine-insulin aspart (NOVOLOG MIX 70/30 FLEXPEN) (70-30) 100 UNIT/ML injection INJECT 40 UNITS EVERY MORNING AND 30 UNITS IN THE EVENING  3 mL  5  . isosorbide mononitrate (IMDUR) 60 MG 24 hr tablet take 1 tablet by mouth once daily  30 tablet  2  . losartan-hydrochlorothiazide (HYZAAR) 100-25 MG per tablet  take 1 tablet by mouth once daily  30 tablet  5  . Melatonin 1 MG CAPS Take 3 mg by mouth at bedtime as needed.        . metoprolol succinate (TOPROL-XL) 25 MG 24 hr tablet Take 1 tablet (25 mg total) by mouth daily.  30 tablet  2  . senna (SENOKOT) 8.6 MG tablet Take 4 tablets by mouth daily.        . simvastatin (ZOCOR) 20 MG tablet take 1 tablet by mouth at bedtime  90 tablet  1  . zolpidem (AMBIEN) 5 MG tablet Take 1 tablet (5 mg total) by mouth at bedtime as needed for sleep.  30 tablet  0    Allergies  Allergen Reactions  . Indocin Diarrhea    Caused severe diarrhea with episodes of incontinance  . Penicillins Hives  . Cimetidine     Breast swelling  . Sulfonamide Derivatives     unknown

## 2012-01-12 ENCOUNTER — Ambulatory Visit: Payer: PRIVATE HEALTH INSURANCE | Admitting: Dietician

## 2012-02-14 ENCOUNTER — Ambulatory Visit: Payer: PRIVATE HEALTH INSURANCE | Admitting: Dietician

## 2012-02-24 ENCOUNTER — Telehealth: Payer: Self-pay | Admitting: Internal Medicine

## 2012-02-24 ENCOUNTER — Telehealth: Payer: Self-pay | Admitting: *Deleted

## 2012-02-24 DIAGNOSIS — M479 Spondylosis, unspecified: Secondary | ICD-10-CM

## 2012-02-24 MED ORDER — HYDROCODONE-ACETAMINOPHEN 5-500 MG PO TABS: 1.0000 | ORAL_TABLET | Freq: Four times a day (QID) | ORAL | Status: DC | PRN

## 2012-02-24 NOTE — Telephone Encounter (Signed)
Received call on 02/24/12 from patient regarding worsening back pain and headache.  Regarding back pain, she notes that pain is all across lower back, left greater than right. Pain is worse with standing, better with sleep. She describes pain as 10 out of 10.  She is unable to qualify pain. She reports that the pain is similar to the back pain she has at baseline but worse in intensity. Pain has worsened over the last week after her husband has come home from having surgery and she is having to take care of him. She denies loss of bowel or bladder control. She has tried muscle relaxants which were prescribed to her when she came for an office visit in March, with no relief. She has been out of Vicodin since prior to her last office visit. I had filled her Vicodin in January.  Regarding her headache, she believes it to be stress-induced. Headache is diffuse and throbbing in nature. It is better with sleep. Headache has also occurred over the last week.  She denies changes in vision.  Assessment/Plan  I suspect her back pain is exacerbated by having to do more chores around the house given her husband has just recently had surgery. She has been out of Lortab for at least the last 2 months. I suspect her headache is in fact stress-induced given recent stressors with her husband's surgery.  I will prescribe Lortab 10-500 every 6 when necessary pain #30.  She does not have a written pain contract but a verbal one is in place. She will need a written pain contract at her next clinic visit.  She is on Lortab for degenerative joint disease, after failure of NSAIDs and development of chronic renal insufficiency.  If her symptoms persist or worsen she should call the clinic again. We will then need to schedule her for an acute care visit.

## 2012-02-24 NOTE — Telephone Encounter (Signed)
Pt called in stating the muscle relaxer's are not helping with her back pain.  Also she is having headaches and tylenol does not help.  She is asking for something to give her some comfort.  She is looking after husband that had surgery.  Cleaning and vacuuming hurts her back.  She would appreciate some help. Pt is on valium 5 mg tid and flexeril 10 mg q 8 hr.  Last visit 3/8 Pt # [phone]

## 2012-02-29 ENCOUNTER — Telehealth: Payer: Self-pay | Admitting: *Deleted

## 2012-02-29 NOTE — Telephone Encounter (Signed)
Pt called - no help from muscle relaxant - has to do inc leifting with husband's new condition. Appt made to see Dr Milbert Coulter this week. [name] Kidney Suresh Audi RN 02/29/12 1:30PM

## 2012-03-02 ENCOUNTER — Encounter: Payer: Self-pay | Admitting: Internal Medicine

## 2012-03-02 ENCOUNTER — Ambulatory Visit (INDEPENDENT_AMBULATORY_CARE_PROVIDER_SITE_OTHER): Payer: PRIVATE HEALTH INSURANCE | Admitting: Internal Medicine

## 2012-03-02 ENCOUNTER — Encounter: Payer: Self-pay | Admitting: Licensed Clinical Social Worker

## 2012-03-02 VITALS — BP 140/70 | HR 85 | Temp 98.1°F | Ht 64.0 in | Wt 209.9 lb

## 2012-03-02 DIAGNOSIS — E559 Vitamin D deficiency, unspecified: Secondary | ICD-10-CM

## 2012-03-02 DIAGNOSIS — E119 Type 2 diabetes mellitus without complications: Secondary | ICD-10-CM

## 2012-03-02 DIAGNOSIS — M1990 Unspecified osteoarthritis, unspecified site: Secondary | ICD-10-CM

## 2012-03-02 DIAGNOSIS — I10 Essential (primary) hypertension: Secondary | ICD-10-CM

## 2012-03-02 DIAGNOSIS — E1165 Type 2 diabetes mellitus with hyperglycemia: Secondary | ICD-10-CM

## 2012-03-02 DIAGNOSIS — F329 Major depressive disorder, single episode, unspecified: Secondary | ICD-10-CM

## 2012-03-02 LAB — GLUCOSE, CAPILLARY

## 2012-03-02 MED ORDER — INSULIN ASPART PROT & ASPART (70-30 MIX) 100 UNIT/ML ~~LOC~~ SUSP: SUBCUTANEOUS | Status: DC

## 2012-03-02 MED ORDER — HYDROCODONE-ACETAMINOPHEN 10-500 MG PO TABS: 1.0000 | ORAL_TABLET | Freq: Four times a day (QID) | ORAL | Status: AC | PRN

## 2012-03-02 MED ORDER — HYDROCODONE-ACETAMINOPHEN 10-500 MG PO TABS: 1.0000 | ORAL_TABLET | Freq: Four times a day (QID) | ORAL | Status: DC | PRN

## 2012-03-02 NOTE — Telephone Encounter (Signed)
Pt seen on 5/9

## 2012-03-02 NOTE — Assessment & Plan Note (Signed)
Recheck vitamin D level. Results to be faxed to Dr. Bernette Redbird at [phone].

## 2012-03-02 NOTE — Assessment & Plan Note (Signed)
Blood pressure mildly elevated today. Has been well controlled in the past. I suspect this is due to increased stress and back pain. We'll monitor it next visit.

## 2012-03-02 NOTE — Assessment & Plan Note (Signed)
Blood sugars uncontrolled. She has not taken any blood sugars in May. There are several readings of 601 from April, which I suspect is the highest that the meter reads. She feels depressed, and eats more when she feels this way. Her back pain is also contributing to worsening diabetes.  -Increase 70/30 insulin to 50 units twice a day -Return in one week with more CBG readings; I encouraged that she do at least one reading a day, before meals

## 2012-03-02 NOTE — Assessment & Plan Note (Signed)
Patient has chronic back pain due to degenerative joint disease, that has failed NSAID therapy. She is being treated with Percocet in the past, which has now been changed to Vicodin. On May 2, I spoke with patient on the phone and entered order in epic, but it was not called in due to my miscommunication.  Medication was called in during patient visit today. Stressed the importance of not overdoing physical activity/lifting as it may worsen her back pain. I suspect that worsening back pain is also contributing to worsening diabetes.

## 2012-03-02 NOTE — Progress Notes (Signed)
Subjective:   Patient ID: [name] female   DOB: July 29, 1951 61 y.o.   MRN: [id number]  HPI: DianaAdriona F [name] is a 61 y.o. 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 She notes her back pain has been worsening over the last 2 weeks because her husband recently underwent hand surgery, and she has been doing more and surround the house. She is also been out of her pain medications. I spoke with her on May 2, and ordered Vicodin, but I think there was a miscommunication on my part and medication was not sent in. She has not lost bowel or bladder control. This pain is similar to what she has always had, but she has just been out of her pain medication. She is on opiates because she failed NSAID therapy and she has chronic renal failure. No shooting numbness or tingling down her legs. Pain is worse with activity. Pain is better with rest.  Regarding her depression, she feels that she is down more frequently now, because her husband is always snapping at her. When she feels depressed, she eats more unhealthy foods. She does not check her blood sugars regularly, in fact she has not had any blood sugar checks in the month of May as of yet. Review her glucometer suggests blood sugars from 107-601. She has several 601 readings, which I suspect is the highest that the glucometer can read. Her daily average blood sugar seems to be around 500 and 600.  Though she is depressed, she denies suicidal and homicidal ideation. Regarding her hyperglycemia, she does report polyuria, polydipsia, and polyphagia. She denies dysuria. She is also fatigued.   Current Outpatient Prescriptions  Medication Sig Dispense Refill  . amLODipine (NORVASC) 5 MG tablet take 1 tablet by mouth once daily  30 tablet  5  . aspirin 81 MG tablet Take 81 mg by mouth daily.        Marland Kitchen buPROPion (WELLBUTRIN XL) 300 MG 24 hr tablet Take 300 mg by mouth daily.        .  citalopram (CELEXA) 40 MG tablet Take 40 mg by mouth daily.        . cyclobenzaprine (FLEXERIL) 10 MG tablet Take 1 tablet (10 mg total) by mouth every 8 (eight) hours as needed for muscle spasms.  30 tablet  1  . dexlansoprazole (DEXILANT) 60 MG capsule Take 60 mg by mouth daily.        . diazepam (VALIUM) 5 MG tablet Take 1 tablet (5 mg total) by mouth every 8 (eight) hours as needed.  90 tablet  2  . ferrous sulfate 325 (65 FE) MG tablet Take 1 tablet (325 mg total) by mouth daily.  30 tablet  3  . gabapentin (NEURONTIN) 800 MG tablet Take 1 tablet (800 mg total) by mouth 3 (three) times daily. Take one tablet by mouth in am and 2 tablets by mouth in pm  90 tablet  11  . insulin aspart protamine-insulin aspart (NOVOLOG MIX 70/30 FLEXPEN) (70-30) 100 UNIT/ML injection INJECT 50 UNITS EVERY MORNING AND 50 UNITS IN THE EVENING  3 mL  11  . isosorbide mononitrate (IMDUR) 60 MG 24 hr tablet take 1 tablet by mouth once daily  30 tablet  2  . losartan-hydrochlorothiazide (HYZAAR) 100-25 MG per tablet take 1 tablet by mouth once daily  30 tablet  5  . metoprolol succinate (TOPROL-XL) 25 MG 24 hr tablet Take 1 tablet (  25 mg total) by mouth daily.  30 tablet  2  . senna (SENOKOT) 8.6 MG tablet Take 4 tablets by mouth daily.        . simvastatin (ZOCOR) 20 MG tablet take 1 tablet by mouth at bedtime  90 tablet  1  . DISCONTD: insulin aspart protamine-insulin aspart (NOVOLOG MIX 70/30 FLEXPEN) (70-30) 100 UNIT/ML injection INJECT 50 UNITS EVERY MORNING AND 35 UNITS IN THE EVENING  3 mL  11  . HYDROcodone-acetaminophen (LORTAB 10) 10-500 MG per tablet Take 1 tablet by mouth every 6 (six) hours as needed for pain.  30 tablet  3    Review of Systems: Constitutional: Denies fever, chills, diaphoresis HEENT: Denies photophobia, eye pain, redness, hearing loss, ear pain, congestion, sore throat, rhinorrhea, sneezing, mouth sores, trouble swallowing, neck pain, neck stiffness and tinnitus.   Respiratory: Denies  SOB, DOE, cough, chest tightness,  and wheezing.   Cardiovascular: Denies chest pain, palpitations and leg swelling.  Gastrointestinal: Denies nausea, vomiting, abdominal pain, diarrhea, constipation, blood in stool and abdominal distention.  Genitourinary: Denies dysuria, urgency, frequency, hematuria, flank pain and difficulty urinating.  Musculoskeletal: As per history of present illness  Skin: Denies pallor, rash and wound.  Neurological: Denies dizziness, seizures, syncope, weakness, light-headedness, numbness and headaches.  Psychiatric/Behavioral: Denies suicidal ideation, confusion, nervousness, and agitation. Sleeps well without Sleep-Aid  Objective:  Physical Exam: Filed Vitals:   03/02/12 0819  BP: 140/70  Pulse: 85  Temp: 98.1 F (36.7 C)  TempSrc: Oral  Height: 5\' 4"  (1.626 m)  Weight: 209 lb 14.4 oz (95.21 kg)  SpO2: 95%   Constitutional: Vital signs reviewed.  Patient is a well-developed and well-nourished woman in no acute distress and cooperative with exam.  Mouth: no erythema or exudates, dry MM Cardiovascular: RRR, S1 normal, S2 normal, no MRG, pulses symmetric and intact bilaterally Pulmonary/Chest: CTAB, no wheezes, rales, or rhonchi Abdominal: Soft. Non-tender, non-distended, bowel sounds are normal, no masses, organomegaly, or guarding present.  Musculoskeletal: Range of motion of back limited by pain  Neurological: A&O x3 Psychiatric: Pleasant affect. speech and behavior is normal. Judgment and thought content normal.  Assessment & Plan:   Case and care discussed with Dr. Josem Kaufmann. Patient to return next week for close diabetes followup. I will likely need to make changes to her insulin regimen at that time. We will also follow up on her vitamin D results. Please see problem-oriented charting for further details.

## 2012-03-02 NOTE — Progress Notes (Signed)
Generic Lortab Rx called in to [facility] on [address] per VO Dr. Milbert Coulter, Dorie Rank, RN, 03/02/2012, 8:45 A

## 2012-03-02 NOTE — Progress Notes (Unsigned)
Ms. Shipp was referred to CSW during a scheduled appt with PCP.  Ms. Connon has been receiving counseling and med management at [facility].  Pt states her therapist, B. Gerre Pebbles was transferred to a different department and pt has yet to be assigned to a new counselor.  Ms. Hinch had been meeting with therapist approx every 2 weeks and has yet to be reassigned.  CSW met with Ms. Mclamb and attempted to contact [facility] Referral Coordinator III, Arline Asp at [phone].  CSW left message requesting a referral to new therapist.  Pt denies add'l needs at this time.   CSW will continue to follow up with Mr. Debbora Dus and inform pt of new therapist.  Pt agreeable to CSW leaving a voicemail message if pt is unavailable.

## 2012-03-02 NOTE — Assessment & Plan Note (Signed)
Worsening depression. She has recently lost her therapist at [facility], as he was transferred. Exacerbating factors her husband, who snaps at her and has recently had surgery, so she is having to do more work around the house for him. She continues her Wellbutrin and Celexa daily. She denies suicidal or homicidal ideation  -Refer to social work, to make sure that she does get in touch with another therapist

## 2012-03-02 NOTE — Patient Instructions (Signed)
-  Please increase your insulin to 50 units twice daily.  -Please try to measure your sugar at least once a day every day until your next appointment with me next week. Try to make these measurements before a meal. For example, today checked her sugar before lunch. Tomorrow checked her sugar before dinner.  -I have called in pain medication to your [facility]. Remember, try to be careful with what you lift, as we don't want you to pull your back out.  -I have ordered a vitamin D level, that Dr. Matthias Hughs requested.  Please be sure to bring all of your medications with you to every visit.  Should you have any new or worsening symptoms, please be sure to call the clinic at [phone].

## 2012-03-03 ENCOUNTER — Encounter: Payer: Self-pay | Admitting: Internal Medicine

## 2012-03-06 ENCOUNTER — Telehealth: Payer: Self-pay | Admitting: Licensed Clinical Social Worker

## 2012-03-06 NOTE — Telephone Encounter (Signed)
CSW following up with pt being referred to new therapist at [facility].  CSW placed call to [facility] and was informed pt will need to call and will be assigned new therapist.  Pt will not need to Walk-In.  CSW call pt and left message on cell phone.  CSW provided [facility]'s [phone] number and instructions to schedule appt.

## 2012-03-08 ENCOUNTER — Ambulatory Visit (HOSPITAL_COMMUNITY)
Admission: RE | Admit: 2012-03-08 | Discharge: 2012-03-08 | Disposition: A | Discharge status: Home / Self Care | Payer: PRIVATE HEALTH INSURANCE | Source: Ambulatory Visit | Attending: Internal Medicine | Admitting: Internal Medicine

## 2012-03-08 DIAGNOSIS — Z1231 Encounter for screening mammogram for malignant neoplasm of breast: Secondary | ICD-10-CM | POA: Insufficient documentation

## 2012-03-09 ENCOUNTER — Encounter: Payer: Self-pay | Admitting: Internal Medicine

## 2012-03-09 ENCOUNTER — Ambulatory Visit (INDEPENDENT_AMBULATORY_CARE_PROVIDER_SITE_OTHER): Payer: PRIVATE HEALTH INSURANCE | Admitting: Internal Medicine

## 2012-03-09 VITALS — BP 110/70 | HR 90 | Temp 97.8°F | Wt 204.7 lb

## 2012-03-09 DIAGNOSIS — E1122 Type 2 diabetes mellitus with diabetic chronic kidney disease: Secondary | ICD-10-CM

## 2012-03-09 DIAGNOSIS — N951 Menopausal and female climacteric states: Secondary | ICD-10-CM

## 2012-03-09 DIAGNOSIS — Z794 Long term (current) use of insulin: Secondary | ICD-10-CM

## 2012-03-09 DIAGNOSIS — I10 Essential (primary) hypertension: Secondary | ICD-10-CM

## 2012-03-09 DIAGNOSIS — E1165 Type 2 diabetes mellitus with hyperglycemia: Secondary | ICD-10-CM

## 2012-03-09 DIAGNOSIS — R232 Flushing: Secondary | ICD-10-CM | POA: Insufficient documentation

## 2012-03-09 DIAGNOSIS — E118 Type 2 diabetes mellitus with unspecified complications: Secondary | ICD-10-CM

## 2012-03-09 LAB — BASIC METABOLIC PANEL
BUN: 30 mg/dL — ABNORMAL HIGH (ref 6–23)
CO2: 24 mEq/L (ref 19–32)
Chloride: 98 mEq/L (ref 96–112)
Creat: 1.71 mg/dL — ABNORMAL HIGH (ref 0.50–1.10)
Glucose, Bld: 354 mg/dL — ABNORMAL HIGH (ref 70–99)

## 2012-03-09 MED ORDER — INSULIN ASPART PROT & ASPART (70-30 MIX) 100 UNIT/ML ~~LOC~~ SUSP: SUBCUTANEOUS | Status: DC

## 2012-03-09 NOTE — Progress Notes (Signed)
  Subjective:   Patient ID: [name] female   DOB: [birth year] 61 y.o.   MRN: [id number]  HPI: [name] is a 61 y.o. woman with uncontrolled diabetes. She presents today for close followup of her blood sugars. She continues to report poor diet. No exercise. She has done a better job of checking her blood sugars at least once a day since her last visit. Review of glucometer's reveals a range of 232-601. Most of her blood sugar readings are after meals.  Back pain better controlled. Mood is okay.  She complains of chronic hot flashes that she has forgotten to discuss with me in the past.  There is no pattern with which they occur.  She had a hysterectomy 30 years prior, and has been dealing with this problem since then. She has been on hormone replacement therapy in the past. She is currently on citalopram and gabapentin.  Review of Systems: General: no fevers, chills, changes in weight, changes in appetite Skin: no rash HEENT: no blurry vision, hearing changes, sore throat Pulm: no dyspnea, coughing, wheezing CV: no chest pain, palpitations, shortness of breath Abd: no abdominal pain, nausea/vomiting, diarrhea/constipation GU: no dysuria, hematuria, polyuria Ext: Chronic back and joint pain due to osteoarthritis   Objective:  Physical Exam: Filed Vitals:   03/09/12 1103  BP: 110/70  Pulse: 90  Temp: 97.8 F (36.6 C)  TempSrc: Oral  Weight: 204 lb 11.8 oz (92.869 kg)   Constitutional: Vital signs reviewed.  Patient is an obese woman in no acute distress and cooperative with exam. Cardiovascular: RRR, S1 normal, S2 normal, no MRG, pulses symmetric and intact bilaterally Pulmonary/Chest: CTAB, no wheezes, rales, or rhonchi Abdominal: Soft. Non-tender, non-distended, bowel sounds are normal Neurological: A&O x3 Skin: Warm, dry and intact. No rash, cyanosis, or clubbing.  Psychiatric: Normal mood and affect. speech and behavior is normal. Judgment and thought content  normal. Cognition and memory are normal.   Assessment & Plan:   Case and care discussed with Dr. Meredith Pel. Patient to follow up in one week for very close monitoring of blood sugars. Please see problem oriented charting for further details.

## 2012-03-09 NOTE — Assessment & Plan Note (Addendum)
Blood sugars remain uncontrolled. Review of glucometer reveals blood sugar as low as 232 but as high as 601, again I believe that 601 is the highest that her glucometer reads.  We spent a long time discussing the necessity of cutting dessert out of her diet.    -Increase morning insulin to 55 units, increase evening insulin to 60 units (total increase 15 units per day) -Check bmet -Return in one week with glucometer to adjust insulin regimen (encouraged blood sugar checks prior to meals)

## 2012-03-09 NOTE — Assessment & Plan Note (Signed)
Patient is status post hysterectomy 30 years prior, with history of hormone replacement therapy. She is already on near max dose of gabapentin.  She is on citalopram for her depression, which has not been shown to be effective for her postmenopausal hot flashes. Uncontrolled diabetes may be contributing, and she is interpreting the symptoms of hot flushes.  -Primary goal is to achieve control of her diabetes, especially because this might be contributing to her symptoms -Consider changing citalopram to fluoxetine or paroxetine which has shown more benefit for hot flashes; will defer this change until diabetes is better controlled, as I did not want to muddy the water -If symptoms become unmanageable, may consider addition of low-dose clonidine (but blood pressure 110/70 today)

## 2012-03-09 NOTE — Assessment & Plan Note (Signed)
Lab Results  Component Value Date   NA 133* 12/31/2011   K 5.0 12/31/2011   CL 100 12/31/2011   CO2 23 12/31/2011   BUN 25* 12/31/2011   CREATININE 1.81* 12/31/2011   CREATININE 1.64* 08/12/2010    BP Readings from Last 3 Encounters:  03/09/12 110/70  03/02/12 140/70  12/31/11 120/67    Assessment: Hypertension control:  controlled  Progress toward goals:  at goal Barriers to meeting goals:  no barriers identified  Plan: Hypertension treatment:  continue current medications : Amlodipine 5, Imdur 60, Losartan-hydrochlorothiazide 100/25, metoprolol 25

## 2012-03-09 NOTE — Patient Instructions (Signed)
-  Increase morning insulin to 55 units. Increase evening insulin to 60 units.  -Please continue to check your blood sugars every day. Please check blood sugars before meals.  Please be sure to bring all of your medications with you to every visit.  Should you have any new or worsening symptoms, please be sure to call the clinic at [phone].

## 2012-03-15 ENCOUNTER — Encounter: Payer: PRIVATE HEALTH INSURANCE | Admitting: Internal Medicine

## 2012-03-16 ENCOUNTER — Encounter: Payer: PRIVATE HEALTH INSURANCE | Admitting: Internal Medicine

## 2012-03-18 ENCOUNTER — Other Ambulatory Visit: Payer: Self-pay | Admitting: Internal Medicine

## 2012-03-18 ENCOUNTER — Other Ambulatory Visit: Payer: Self-pay | Admitting: Cardiovascular Disease

## 2012-03-21 ENCOUNTER — Ambulatory Visit (INDEPENDENT_AMBULATORY_CARE_PROVIDER_SITE_OTHER): Payer: PRIVATE HEALTH INSURANCE | Admitting: Internal Medicine

## 2012-03-21 ENCOUNTER — Encounter: Payer: Self-pay | Admitting: Internal Medicine

## 2012-03-21 VITALS — BP 154/83 | HR 84 | Temp 98.7°F | Ht 64.0 in | Wt 210.0 lb

## 2012-03-21 DIAGNOSIS — I10 Essential (primary) hypertension: Secondary | ICD-10-CM

## 2012-03-21 DIAGNOSIS — I129 Hypertensive chronic kidney disease with stage 1 through stage 4 chronic kidney disease, or unspecified chronic kidney disease: Secondary | ICD-10-CM

## 2012-03-21 DIAGNOSIS — N189 Chronic kidney disease, unspecified: Secondary | ICD-10-CM

## 2012-03-21 DIAGNOSIS — E1165 Type 2 diabetes mellitus with hyperglycemia: Secondary | ICD-10-CM

## 2012-03-21 DIAGNOSIS — E785 Hyperlipidemia, unspecified: Secondary | ICD-10-CM

## 2012-03-21 DIAGNOSIS — E119 Type 2 diabetes mellitus without complications: Secondary | ICD-10-CM

## 2012-03-21 MED ORDER — METOPROLOL SUCCINATE ER 25 MG PO TB24: 25.0000 mg | ORAL_TABLET | Freq: Every day | ORAL | Status: DC

## 2012-03-21 MED ORDER — GABAPENTIN 800 MG PO TABS: 800.0000 mg | ORAL_TABLET | Freq: Three times a day (TID) | ORAL | Status: DC

## 2012-03-21 MED ORDER — AMLODIPINE BESYLATE 5 MG PO TABS: 5.0000 mg | ORAL_TABLET | Freq: Every day | ORAL | Status: DC

## 2012-03-21 MED ORDER — INSULIN ASPART 100 UNIT/ML ~~LOC~~ SOLN: SUBCUTANEOUS | Status: DC

## 2012-03-21 MED ORDER — ISOSORBIDE MONONITRATE ER 60 MG PO TB24: 60.0000 mg | ORAL_TABLET | Freq: Every day | ORAL | Status: DC

## 2012-03-21 MED ORDER — LOSARTAN POTASSIUM-HCTZ 100-25 MG PO TABS: 1.0000 | ORAL_TABLET | Freq: Every day | ORAL | Status: DC

## 2012-03-21 MED ORDER — SIMVASTATIN 20 MG PO TABS: 20.0000 mg | ORAL_TABLET | Freq: Every day | ORAL | Status: DC

## 2012-03-21 NOTE — Progress Notes (Signed)
Subjective:     Patient ID: [name], female   DOB: [birth year], 61 y.o.   MRN: [id number]  HPI Ms. Carmon Sails is a 61 year old woman with past medical history significant for type 2 diabetes, hypertension and hyperlipidemia who presents today for followup of her diabetes. Patient was recently seen 2 weeks ago for elevated blood sugars. At that visit her Lantus was increased by 15 units. Patient takes Lantus twice daily. Patient has been checking her blood sugars which have been noted to be between 415-601. Patient does have a 1 blood sugar that was 277 however all of her blood sugars continue to run very high. Patient reports that she has been eating a lot more since her Lantus has been increased. She notes eating a lot of sandwiches including corn bread sandwiches. Patient denies any hypoglycemic episodes. She does admit to reducing her intake of pastries, cakes and cookies however has supplemented those with sandwiches.  Patient has no other complaints or concerns today. She denies chest pain, cough, sob, headache, N/V, changes in abdominal and urinary character.   Review of Systems  All other systems reviewed and are negative.       Objective:   Physical Exam  Vitals reviewed. Constitutional: She appears well-developed.  Cardiovascular: Normal rate and regular rhythm.   Pulmonary/Chest: Effort normal and breath sounds normal.  Abdominal: Soft. Bowel sounds are normal.  Musculoskeletal: Normal range of motion.

## 2012-03-21 NOTE — Patient Instructions (Addendum)
Please start taking short acting insulin in addition to your existing insulin (lantus). If your blood sugars are greater than 150 you will inject 3 units, if they are greater than 200 you will inject 5 units, greater than 300 inject 7 units, greater than 400 inject 9 units.  Please try to substitute snacks with a glass of water, or apple, or mixed vegetables like carrots and celery. Please try your best to count carbs such as how much potatoes, corn bread, pasta, rice, and bread in general.  Please follow up in 2 months.

## 2012-03-21 NOTE — Assessment & Plan Note (Addendum)
Lab Results  Component Value Date   HGBA1C 10.9 12/31/2011   HGBA1C 6.2 11/20/2010   CREATININE 1.71* 03/09/2012   CREATININE 1.64* 08/12/2010   MICROALBUR 1.74 09/22/2011   MICRALBCREAT 31.2* 09/22/2011   CHOL 150 12/31/2011   HDL 31* 12/31/2011   TRIG 475* 12/31/2011    Last eye exam and foot exam: No results found for this basename: HMDIABEYEEXA, HMDIABFOOTEX    Assessment: Diabetes control: not controlled Progress toward goals: deteriorated Barriers to meeting goals: lack of understanding of disease management  Plan: Diabetes treatment: continue current medications Start sliding scale insulin for patient. Instructed her how to take her insulin prior to meals. Refer to: none Instruction/counseling given: reminded to bring blood glucose meter & log to each visit, reminded to bring medications to each visit, discussed the need for weight loss and discussed diet  Counseled patient on carb counting. Advised patient to avoid foods that are high in starch such as corn bread, potatoes, pasta, rice all foods that patient reports aching a lot of. Patient is also instructed to schedule an appointment with diabetes educator.

## 2012-03-21 NOTE — Assessment & Plan Note (Signed)
Lab Results  Component Value Date   NA 132* 03/09/2012   K 4.3 03/09/2012   CL 98 03/09/2012   CO2 24 03/09/2012   BUN 30* 03/09/2012   CREATININE 1.71* 03/09/2012   CREATININE 1.64* 08/12/2010    BP Readings from Last 3 Encounters:  03/21/12 154/83  03/09/12 110/70  03/02/12 140/70    Assessment: Hypertension control:  mildly elevated  Progress toward goals:  deteriorated Barriers to meeting goals:  not taking norvasc as she needs to pick it up from pharmacy  Plan: Hypertension treatment:  continue current medications recheck at next visit. Looking back patient's blood pressure has been pretty well controlled.

## 2012-03-21 NOTE — Assessment & Plan Note (Signed)
Lab Results  Component Value Date   CREATININE 1.71* 03/09/2012   At baseline, CKD in setting of diabetes. Continue to monitor.

## 2012-04-04 ENCOUNTER — Ambulatory Visit (INDEPENDENT_AMBULATORY_CARE_PROVIDER_SITE_OTHER): Payer: PRIVATE HEALTH INSURANCE | Admitting: Dietician

## 2012-04-04 VITALS — Ht 64.0 in | Wt 213.0 lb

## 2012-04-04 DIAGNOSIS — Z794 Long term (current) use of insulin: Secondary | ICD-10-CM

## 2012-04-04 NOTE — Progress Notes (Signed)
Medical Nutrition Therapy:  Appt start time: 1050 end time:  1140.  Assessment:  Primary concerns today: Blood sugar control and weight Usual eating pattern includes Meal 2 and 2+ large snacks per day.   Meals at 1-3 PM and 4-8 PM. Patient reports she has begun awakening in middle of night with bed a mess like she'd been in a fight, eating sheets, and with wild dreams.  Sleepy often during day. Avoided foods include: soda.-regular, sweets, everyday foods:coffee x 2 cups, diet pepsi x ~ 4 cans, peanut butter sandwiches and pork skins  Usual physical activity includes doing aerobic activity 30 minutes a day 3 days per week but has not been doing for past few months due to husband's surgery, appointments and granddaughter staying with them CBGs much improved: 14 day average 208/ 30 day average 311, checks 1-2x per day, CBG 122 today in office after yogurt and banana for breakfast with 2 cups coffee.  Fasting: 157/98/267/166 Midday: 131/142/122 today in office Later day and PM: 320/441/298/293  Progress Towards Goal(s):  In progress   Nutritional Diagnosis:  NB-1.1 Food and nutrition-related knowledge deficit as related to not taking insulin at correct times as evidenced resolved.  NB-1.4 Self-monitoring deficit As related to not chekcing blood sugar when having symptoms of high and low blood sugar.  As evidenced by her report.  NI-5.8.4 Inconsistent carbohydrate intake improving and needs additional reinforcement as evidenced by blood sugars.   NI-5.6.3 Inappropriate intake of fats (specify): excess from fried foods and pork rinds As related to insulin resistance and unintentional weight gain  As evidenced by high blood sugars and excess body weight.      Interventions: 1- Education about hypoglycemia- asked patient to check CBG 2x between 2 and 3 am before next visit and call if < 110 mg/dl.  2- Education about carb counting and healthier low carb snacks 3- Counseling and support provided  encouraging patient to restart physical activity and build self confidence.   Monitoring/Evaluation:  Dietary intake, weight and meter download  in 4 week(s)

## 2012-04-04 NOTE — Patient Instructions (Signed)
You are doing a great job with your blood sugars- they are getting better!   Healthy snacks that will NOT raise blood sugar: Cucumbers, carrots, celery, Unsalted almonds, unsalted peanuts, celery, black olives,  Healthy snacks that DO raise blood sugar: light or fat free popcorn( mini-bag is a prefect serving) , kiwi, pineapple, pears, apples, strawberries, banana, prunes  Please decrease PORK RINDS- think about only having them when they you are out and buying a small bag.   Please Limit/keep snacks to 1 servings which is about 15 grams carb:  half a sandwich(peanut butter)  small apple  Sugar free popsicles  Please make a follow-up in 4 weeks.

## 2012-04-06 ENCOUNTER — Other Ambulatory Visit: Payer: Self-pay | Admitting: *Deleted

## 2012-04-06 MED ORDER — INSULIN PEN NEEDLE 31G X 5 MM MISC: Status: DC

## 2012-05-24 ENCOUNTER — Encounter: Payer: Self-pay | Admitting: Internal Medicine

## 2012-05-24 ENCOUNTER — Ambulatory Visit (INDEPENDENT_AMBULATORY_CARE_PROVIDER_SITE_OTHER): Payer: PRIVATE HEALTH INSURANCE | Admitting: Internal Medicine

## 2012-05-24 ENCOUNTER — Ambulatory Visit (INDEPENDENT_AMBULATORY_CARE_PROVIDER_SITE_OTHER): Payer: PRIVATE HEALTH INSURANCE | Admitting: Dietician

## 2012-05-24 VITALS — BP 106/67 | HR 76 | Temp 98.1°F | Wt 217.2 lb

## 2012-05-24 DIAGNOSIS — F329 Major depressive disorder, single episode, unspecified: Secondary | ICD-10-CM

## 2012-05-24 DIAGNOSIS — D509 Iron deficiency anemia, unspecified: Secondary | ICD-10-CM

## 2012-05-24 DIAGNOSIS — E1165 Type 2 diabetes mellitus with hyperglycemia: Secondary | ICD-10-CM

## 2012-05-24 DIAGNOSIS — F3289 Other specified depressive episodes: Secondary | ICD-10-CM

## 2012-05-24 DIAGNOSIS — G44209 Tension-type headache, unspecified, not intractable: Secondary | ICD-10-CM

## 2012-05-24 DIAGNOSIS — N951 Menopausal and female climacteric states: Secondary | ICD-10-CM

## 2012-05-24 DIAGNOSIS — G44229 Chronic tension-type headache, not intractable: Secondary | ICD-10-CM

## 2012-05-24 DIAGNOSIS — Z79899 Other long term (current) drug therapy: Secondary | ICD-10-CM

## 2012-05-24 DIAGNOSIS — I10 Essential (primary) hypertension: Secondary | ICD-10-CM

## 2012-05-24 DIAGNOSIS — R232 Flushing: Secondary | ICD-10-CM

## 2012-05-24 DIAGNOSIS — IMO0001 Reserved for inherently not codable concepts without codable children: Secondary | ICD-10-CM

## 2012-05-24 DIAGNOSIS — Z794 Long term (current) use of insulin: Secondary | ICD-10-CM

## 2012-05-24 MED ORDER — ESTRADIOL 1 MG PO TABS: 1.0000 mg | ORAL_TABLET | Freq: Every day | ORAL | Status: DC

## 2012-05-24 NOTE — Assessment & Plan Note (Signed)
Lab Results  Component Value Date   NA 132* 03/09/2012   K 4.3 03/09/2012   CL 98 03/09/2012   CO2 24 03/09/2012   BUN 30* 03/09/2012   CREATININE 1.71* 03/09/2012   CREATININE 1.64* 08/12/2010    BP Readings from Last 3 Encounters:  05/24/12 106/67  03/21/12 154/83  03/09/12 110/70    Assessment: Hypertension control:  controlled  Progress toward goals:  at goal Barriers to meeting goals:  no barriers identified  Plan: Hypertension treatment:  continue current medications I must bmet, sodium was low, but glucose was elevated and sodium corrects to 136.

## 2012-05-24 NOTE — Assessment & Plan Note (Signed)
Patient has not been compliant with iron, but agrees to trial of iron for the next 3 months, at which point we will repeat CBC. Of note she has had a colonoscopy and EGD recently and they were negative for pathology.

## 2012-05-24 NOTE — Progress Notes (Signed)
Subjective:   Patient ID: [name] female   DOB: August 12, 1951 61 y.o.   MRN: [id number]  HPI: [name] is a 61 y.o.  Woman with past medical history significant for poorly controlled diabetes, hypertension, low back pain, iron deficiency anemia, and depression. She presents today for diabetes followup.  #Diabetes: Patient reports compliance with her insulin regimen, which is 70/30 insulin, 55 units every morning and 60 units every evening. At last visit she was started on a sliding scale, but she does not her regular diet regimen, she has not been able to comply appropriately. She tries to check her blood sugars 3 times a day, but forgets to check many times. Her blood sugar was 78 this morning, but this does not happen frequently. Review of her glucometer suggests that her average glucose reading is 263. 56% of her readings are above target. Patient reports that she has poor control over her diet, and is constantly eating. She wants to start exercising again more frequently, and she attended the [facility] this morning. She notes that she has gained weight over the last few months.  #Headache: Patient reports a bitemporal and posterior headache for the last 3-4 months. Worse with lying down. It occurs daily. Currently rates pain as 5/10 (CBG at clinic is 188). Eventually she is able to sleep the headache away. Headache is sometimes controlled with Vicodin.  #Hot Flashes: Patient reports that she's had hot flashes for several years now. She is status post hysterectomy in her 30s. She is already taking an SSRI and gabapentin.  #Right hand pain: Patient reports chronic right hand pain especially over her metacarpal joints. She has history of carpal tunnel, so she wears a hand brace, which stabilizes her wrist. She requests some support for her hand. No numbness or tingling. No change in strength.   Current Outpatient Prescriptions  Medication Sig Dispense Refill  . amLODipine (NORVASC) 5 MG  tablet Take 1 tablet (5 mg total) by mouth daily.  30 tablet  5  . aspirin 81 MG tablet Take 81 mg by mouth daily.        Marland Kitchen buPROPion (WELLBUTRIN XL) 300 MG 24 hr tablet Take 300 mg by mouth daily.        . citalopram (CELEXA) 40 MG tablet Take 40 mg by mouth daily.        Marland Kitchen dexlansoprazole (DEXILANT) 60 MG capsule Take 60 mg by mouth daily.        . diazepam (VALIUM) 5 MG tablet Take 1 tablet (5 mg total) by mouth every 8 (eight) hours as needed.  90 tablet  2  . gabapentin (NEURONTIN) 800 MG tablet Take 1 tablet (800 mg total) by mouth 3 (three) times daily. Take one tablet by mouth in am and 2 tablets by mouth in pm  90 tablet  11  . insulin aspart (NOVOLOG) 100 UNIT/ML injection Please check your blood sugar prior to every meal. If your blood sugar is greater than 150 take 3 units 200 take 5 units 300 take 7 units 400 take 9 units  10 mL  3  . insulin aspart protamine-insulin aspart (NOVOLOG MIX 70/30 FLEXPEN) (70-30) 100 UNIT/ML injection INJECT 55 UNITS EVERY MORNING AND 60 UNITS IN THE EVENING  3 mL  11  . Insulin Pen Needle 31G X 5 MM MISC Box of 100 - uses up to 5 times per day according to CBG  100 each  11  . isosorbide mononitrate (IMDUR) 60 MG 24  hr tablet Take 1 tablet (60 mg total) by mouth daily.  30 tablet  6  . losartan-hydrochlorothiazide (HYZAAR) 100-25 MG per tablet Take 1 tablet by mouth daily.  30 tablet  5  . metoprolol succinate (TOPROL-XL) 25 MG 24 hr tablet Take 1 tablet (25 mg total) by mouth daily.  30 tablet  6  . senna (SENOKOT) 8.6 MG tablet Take 4 tablets by mouth daily.        . simvastatin (ZOCOR) 20 MG tablet Take 1 tablet (20 mg total) by mouth at bedtime.  90 tablet  3  . ferrous sulfate 325 (65 FE) MG tablet Take 1 tablet (325 mg total) by mouth daily.  30 tablet  3   Review of Systems: Constitutional: Denies fever, chills, diaphoresis, appetite change HEENT: Denies photophobia, eye pain, redness, hearing loss, ear pain, congestion, sore throat,  rhinorrhea, sneezing, mouth sores, trouble swallowing, neck pain, neck stiffness and tinnitus.   Respiratory: Denies SOB, DOE, cough, chest tightness,  and wheezing.   Cardiovascular: Denies chest pain, palpitations and leg swelling.  Gastrointestinal: Denies nausea, vomiting, abdominal pain, diarrhea, constipation, blood in stool and abdominal distention.  Genitourinary: Denies dysuria, urgency, frequency, hematuria, flank pain and difficulty urinating.  Musculoskeletal: Denies myalgias, joint swelling and gait problem.  Skin: Denies pallor, rash and wound.  Neurological: Denies dizziness, seizures, syncope, weakness, light-headedness, numbness and headaches.  Psychiatric/Behavioral: Denies suicidal ideation, mood changes, confusion, nervousness  Objective:  Physical Exam: Filed Vitals:   05/24/12 1350  BP: 106/67  Pulse: 76  Temp: 98.1 F (36.7 C)  TempSrc: Oral  Weight: 217 lb 3.2 oz (98.521 kg)   Constitutional: Vital signs reviewed.  Patient is an obese woman in no acute distress and cooperative with exam. Eyes: PERRL, EOMI, conjunctivae normal, No scleral icterus.  Neck: No thyromegaly Cardiovascular: RRR, S1 normal, S2 normal, no MRG, pulses symmetric and intact bilaterally Pulmonary/Chest: CTAB, no wheezes, rales, or rhonchi Abdominal: Soft. Non-tender, non-distended, bowel sounds are normal, no masses, organomegaly, or guarding present.  Musculoskeletal: No joint deformities, erythema, or stiffness, ROM full and no nontender Neurological: A&O x3, Strength is normal and symmetric bilaterally, cranial nerve II-XII are grossly intact, no focal motor deficit, sensory intact to light touch bilaterally.  Skin: Warm, dry and intact. No rash, cyanosis, or clubbing.  Psychiatric: Normal mood and affect. speech and behavior is normal. Judgment and thought content normal. Cognition and memory are normal.   Assessment & Plan:   Case and care discussed with Dr. Aundria Rud. Patient to return  in 3 months for diabetes followup. Please see problem-oriented charting for further details.

## 2012-05-24 NOTE — Assessment & Plan Note (Signed)
Patient's headache seems to be related to tension based on location, and given her history of recent home stressors as well as history of depression and anxiety. Initially, the plan was to try her on Tylenol, that she is on Vicodin 5 500 every 6 hours when necessary. I called and left a message on patient's answering machine to avoid taking any more Tylenol and to call if headache becomes worse. When she was in the clinic, I encouraged her to undertake relaxation techniques. No neurological changes, therefore I am not concerned for a life-threatening intracranial process.

## 2012-05-24 NOTE — Assessment & Plan Note (Signed)
Lab Results  Component Value Date   HGBA1C 10.7 05/24/2012   HGBA1C 6.2 11/20/2010   CREATININE 1.71* 03/09/2012   CREATININE 1.64* 08/12/2010   MICROALBUR 1.74 09/22/2011   MICRALBCREAT 31.2* 09/22/2011   CHOL 150 12/31/2011   HDL 31* 12/31/2011   TRIG 475* 12/31/2011   Assessment: Diabetes control: not controlled Progress toward goals: unchanged Barriers to meeting goals: lack of understanding of disease management  Plan: Diabetes treatment: continue current medications Patient has an appointment with Norm Parcel today, and we will encourage better diet management. She will also continue exercising. If in 3 months her hemoglobin A1c is not improved, we will consider changing her to Lantus with prandial insulin, as 70/30 may not be appropriate to control her diabetes. Refer to: diabetes educator for medical nutrition therapy Instruction/counseling given: reminded to bring blood glucose meter & log to each visit, reminded to bring medications to each visit, discussed the need for weight loss and discussed diet

## 2012-05-24 NOTE — Patient Instructions (Addendum)
-  Please try tylenol for your headaches - but it is important that you try not to use medication longer than 1-2 weeks, as you may develop a medication over use headache. Try relaxation techniques as well!  -Regarding your hand pain, try to stabilize your hand joints with an ACE wrap when working - if you receive no relief, we will consider imaging at your next appointment.  -It is very important that you modify your diet, as this will help control your diabetes.  Great job getting back into the gym!!  We are not going to change your insulin regimen at this time.  Please be sure to bring all of your medications with you to every visit.  Should you have any new or worsening symptoms, please be sure to call the clinic at [phone].  Have fun in [place]!

## 2012-05-24 NOTE — Progress Notes (Signed)
Medical Nutrition Therapy:  Appt start time: 1445 end time:  1510  Assessment:  Primary concerns today: Blood sugar control and weight. Weight increased 4# in past 6 weeks, 12# since February 2013. Using diet sodas, eating less sweets, more vegetables as snacks.   Meals at 11-3 PM and 4-8 PM- is eating earlier now.  Has restarted Silver. Sneakers aerobics 30 minutes a day 3 days per week . Not using correction insulin. CBGs improved from May, but A1C still 10.7%. : 30 day average 210 (decreased from 311), checks 1-2x per day, higher in Pm - more in target in am. 38.5% overall in past 30 days in target (80-180) CBG 188 today in office after cheerios, milk yogurt  for breakfast and peanut butter sandwich for lunch.  Fasting:  Midday:  Later day and PM:   Progress Towards Goal(s):  In progress   Nutritional Diagnosis:   NB-1.4 Self-monitoring deficit As related to not chekcing blood sugar when having symptoms of high and low blood sugar.  As evidenced by her report.  NI-5.8.4 Inconsistent carbohydrate intake improving and needs additional reinforcement as evidenced by blood sugars and A1C of 10.7%.   NI-5.6.3 Inappropriate intake of fats (specify): excess from fried foods and pork rinds As related to insulin resistance and unintentional weight gain  As evidenced by high blood sugars and excess body weight.      Interventions: 1- Review and additional education about carb counting and healthier low carb snacks 2- Counseling and support provided encouraging patient to restart physical activity and build self confidence. Ty 1 day food records 3- Coordination of care with physician: consider trial of incretin with reduced insulin doses for appetite and blood glucose control  Monitoring/Evaluation:  Dietary intake, weight and meter download  in 4-6 week(s)

## 2012-05-24 NOTE — Assessment & Plan Note (Signed)
Patient is interested and estradiol. I discussed the risks of estradiol, which include blood clots, atherosclerosis and stroke, and breast cancer. She feels that her symptoms have become unmanageable, as we did address this problem in May 2013.  We will do a 6 month trial of Estrace 1 mg daily 3 weeks on one-week off.

## 2012-05-24 NOTE — Patient Instructions (Addendum)
Your blood sugars are doing better!!!  Do more exercise- try not to skip 2 days in a row of exercise.  Ideas for lower carb foods and snacks:   Nature's own white wheat or double fiber bread - 2 slices count as 1 slice   Use low fat cheese, ham or [place]  instead of pepperoni (too high in saturated fats)   Can snack on thawed out frozen peas, coleslaw with miracle whip   Hard boiled eggs are a great snack- boil a bunch at one time- can peel and have waiting in frig for snack time!!! can make a quick deviled  egg by putting a little relish, miracle whip light and mustard on it.   Food Journal/diary- Try writing down everything you eat and drink for one day  Please make a follow up in 4-6 weeks.

## 2012-05-24 NOTE — Assessment & Plan Note (Signed)
Patient feels well today. She is being followed again by a counselor at [facility] [employer].

## 2012-06-05 ENCOUNTER — Other Ambulatory Visit: Payer: Self-pay | Admitting: Internal Medicine

## 2012-06-07 ENCOUNTER — Other Ambulatory Visit: Payer: Self-pay | Admitting: Internal Medicine

## 2012-06-07 DIAGNOSIS — E1165 Type 2 diabetes mellitus with hyperglycemia: Secondary | ICD-10-CM

## 2012-06-07 NOTE — Telephone Encounter (Signed)
Called to pharm 

## 2012-06-15 ENCOUNTER — Other Ambulatory Visit: Payer: Self-pay | Admitting: Dietician

## 2012-06-15 DIAGNOSIS — E1165 Type 2 diabetes mellitus with hyperglycemia: Secondary | ICD-10-CM

## 2012-06-16 ENCOUNTER — Ambulatory Visit (INDEPENDENT_AMBULATORY_CARE_PROVIDER_SITE_OTHER): Payer: PRIVATE HEALTH INSURANCE | Admitting: Dietician

## 2012-06-16 ENCOUNTER — Other Ambulatory Visit: Payer: Self-pay | Admitting: Dietician

## 2012-06-16 DIAGNOSIS — E1165 Type 2 diabetes mellitus with hyperglycemia: Secondary | ICD-10-CM

## 2012-06-16 DIAGNOSIS — Z794 Long term (current) use of insulin: Secondary | ICD-10-CM

## 2012-06-16 DIAGNOSIS — IMO0002 Reserved for concepts with insufficient information to code with codable children: Secondary | ICD-10-CM

## 2012-06-16 LAB — LDL CHOLESTEROL, DIRECT: Direct LDL: 60 mg/dL

## 2012-06-16 NOTE — Progress Notes (Signed)
Needs additional 4 hours

## 2012-06-16 NOTE — Patient Instructions (Addendum)
Please take evening insulin before largest meal around 5-7 PM.  Please carry candy to treat low blood sugars with you at all times  Can try keeping Novolog Mix 70/30 insulin with meter to remind you to check blood sugar before you take your insulin.  Please check blood sugar before and after exercise, if lower than 100 before exercise,please eat snack before your exercise.   You are doing fantastic! Keep up the healthy eating and exercise!!!  Please make a follow up with me in 8 weeks.

## 2012-06-16 NOTE — Telephone Encounter (Signed)
Can no longer get testing supplies from [facility].  Patient agrees to new meter. Will leave accu check nano meter at front desk for patient to pick up and request rx for supplies to be sent to [facility].

## 2012-06-16 NOTE — Progress Notes (Signed)
Medical Nutrition Therapy:  Appt start time: 1030 end time:  1110  Assessment:  Primary concerns today: Blood sugar control and weight. Weight back to 213 today.  Kept 24 hour food record. Using diet sodas, eating less sweets, not pork skins,  still considerable fat intake. Needs more vegetables and fruits.   Meals at 11-3 PM and 4-7 PM- is eating earlier now.  Has restarted Silver. Sneakers aerobics 30 minutes a day 3 days per week . Using correction insulin every time sugar is > 150 and taking 54 units novolog mix 70/30 before breakfast and 60 units before bed.  CBGs improved from July with 14 day average 100 and 30 day  average 157. Had 4 episodes of hypoglycemia due to no or too little morning food intake.   Progress Towards Goal(s):  In progress   Nutritional Diagnosis:   NB-1.4 Self-monitoring deficit As related to not chekcing blood sugar when having symptoms of high and low blood sugar.improving.  NI-5.8.4 Inconsistent carbohydrate intake improving and needs additional reinforcement as evidenced by blood sugars and A1C of 10.7%.   NI-5.6.3 Inappropriate intake of fats (specify): excess from fried foods and pork rinds As related to insulin resistance and unintentional weight gain  As evidenced by high blood sugars and excess body weight.     Interventions: 1- Review and additional education about carb counting and healthier low fat snacks(fruits, vegetables) and balanced meals 2- Counseling and support provided encouraging patient to restart physical activity and build self confidence.  3- Coordination of care with physician:  discussed decreasing am dose, physician desires hold dose to current regimen,patient requests rx for testing supplies for 3x./day testing   Monitoring/Evaluation:  Dietary intake, weight and meter download  in 8 week(s)

## 2012-06-17 MED ORDER — ACCU-CHEK FASTCLIX LANCETS MISC: 1.0000 | Freq: Three times a day (TID) | Status: DC

## 2012-06-17 MED ORDER — GLUCOSE BLOOD VI STRP: ORAL_STRIP | Status: DC

## 2012-06-23 ENCOUNTER — Ambulatory Visit: Payer: PRIVATE HEALTH INSURANCE | Admitting: Dietician

## 2012-07-18 ENCOUNTER — Telehealth: Payer: Self-pay | Admitting: Dietician

## 2012-07-18 NOTE — Telephone Encounter (Signed)
Left message that meter is at front for her to pick up and asked for return call.

## 2012-07-19 NOTE — Telephone Encounter (Signed)
Agree. Thank You.

## 2012-07-19 NOTE — Telephone Encounter (Signed)
Hurts in hands, feet,  Back, congested, hasn't felt good for past 2 weeks. NP from her health insurance company came out last Friday and asis she should follow up because her blood pressure was 170s/ 80-90 in both arms  Still having trouble with it dropping mid morning between even when she eats 68, 63, 66, 64, 66, 69, 62, this makes her feel bad and like sleeping the rest of the day. Evening sugars mostly 100-200.  Instructed patient to decrease am insulin to 53 units from 55 units and will call her back if doctor disagrees. Also will ask per pt request for doctor appointment on Monday 9/30 13 since she already has an appointment with CDE for that day.   Spoke with Triage nurse. Appointment scheduled for Anja to see Dr. Sherrine Maples on Monday at 11:15 AM. Fulton Mole was notified and will call if she feel she needs to be seen sooner.

## 2012-07-24 ENCOUNTER — Ambulatory Visit (INDEPENDENT_AMBULATORY_CARE_PROVIDER_SITE_OTHER): Payer: PRIVATE HEALTH INSURANCE | Admitting: Dietician

## 2012-07-24 ENCOUNTER — Encounter: Payer: Self-pay | Admitting: Dietician

## 2012-07-24 ENCOUNTER — Ambulatory Visit (INDEPENDENT_AMBULATORY_CARE_PROVIDER_SITE_OTHER): Payer: PRIVATE HEALTH INSURANCE | Admitting: Internal Medicine

## 2012-07-24 ENCOUNTER — Encounter: Payer: Self-pay | Admitting: Internal Medicine

## 2012-07-24 VITALS — BP 129/70 | HR 70 | Temp 98.2°F | Ht 64.0 in | Wt 213.5 lb

## 2012-07-24 VITALS — Ht 64.0 in | Wt 213.5 lb

## 2012-07-24 DIAGNOSIS — G5600 Carpal tunnel syndrome, unspecified upper limb: Secondary | ICD-10-CM

## 2012-07-24 DIAGNOSIS — R5381 Other malaise: Secondary | ICD-10-CM

## 2012-07-24 DIAGNOSIS — G5603 Carpal tunnel syndrome, bilateral upper limbs: Secondary | ICD-10-CM

## 2012-07-24 DIAGNOSIS — Z794 Long term (current) use of insulin: Secondary | ICD-10-CM

## 2012-07-24 DIAGNOSIS — E1165 Type 2 diabetes mellitus with hyperglycemia: Secondary | ICD-10-CM

## 2012-07-24 DIAGNOSIS — I10 Essential (primary) hypertension: Secondary | ICD-10-CM

## 2012-07-24 LAB — GLUCOSE, CAPILLARY: Glucose-Capillary: 99 mg/dL (ref 70–99)

## 2012-07-24 LAB — IBC PANEL
%SAT: 23 % (ref 20–55)
TIBC: 388 ug/dL (ref 250–470)
UIBC: 297 ug/dL (ref 125–400)

## 2012-07-24 LAB — CBC
HCT: 37.9 % (ref 36.0–46.0)
Hemoglobin: 12.1 g/dL (ref 12.0–15.0)
MCHC: 31.9 g/dL (ref 30.0–36.0)
RBC: 4.71 MIL/uL (ref 3.87–5.11)
WBC: 7.7 10*3/uL (ref 4.0–10.5)

## 2012-07-24 LAB — IRON: Iron: 91 ug/dL (ref 42–145)

## 2012-07-24 MED ORDER — FLUTICASONE PROPIONATE 50 MCG/ACT NA SUSP: 2.0000 | Freq: Every day | NASAL | Status: DC

## 2012-07-24 NOTE — Progress Notes (Signed)
Patient ID: [name], female   DOB: 1951-04-23, 61 y.o.   MRN: [id number]  Subjective:   Patient ID: [name] female   DOB: December 26, 1950 61 y.o.   MRN: [id number]  HPI: [name] is a 61 y.o. female w/ PMH poorly controlled diabetes, hypertension, low back pain, iron deficiency anemia, and depression, who presents today for hypoglycemia and malaise.  Pain and tingling in hands, esp after holding the phone or a utensil. Does have a h/o carpel tunnel. Also w/ pain in feet. Neurontin not helping.   Nasal congestion and malaise x 3 weeks, denies fevers.   Late morning CBG in 60s and pre dinner CBGs greater than 150s. Insulin reduced to 53u in the morning, which improved hypoglycemia in the late mornings, but evening sugars have been from 100s-200s. She is not using her SSI regularly b/c she is afraid of bottoming out her sugars.  Endorses malaise and fatigue, and episodes of diaphoresis. Denies fevers,  sick contacts, blood in her stool, or vaginal bleeding.   Past Medical History  Diagnosis Date  . Hypertension   . Peripheral neuropathy   . Cervicalgia   . Low back pain syndrome   . Hyperlipidemia   . Vitamin d deficiency   . Vitamin B12 deficiency   . Iliotibial band syndrome   . Anemia   . Borderline personality disorder   . Nonorganic psychosis   . GERD (gastroesophageal reflux disease)   . Diabetes mellitus   . Depression   . Diabetic gastroparesis   . Sleep apnea   . CAD (coronary artery disease)     Catherization 11/18/09>nonobstructive CAD , normal LV function, EF 65%  . Health maintenance examination     Colonoscopy 2009> normal; Diabetic eye exam 12/2008. No diabetic retinopathy; Mammogram 11/10> No evidence of malignancy   Current Outpatient Prescriptions  Medication Sig Dispense Refill  . ACCU-CHEK FASTCLIX LANCETS MISC 1 each by Does not apply route 3 (three) times daily before meals. Dx code 250.00  102 each  12  . amLODipine (NORVASC) 5 MG tablet  Take 1 tablet (5 mg total) by mouth daily.  30 tablet  5  . aspirin 81 MG tablet Take 81 mg by mouth daily.        . citalopram (CELEXA) 40 MG tablet Take 40 mg by mouth daily.        Marland Kitchen estradiol (ESTRACE) 1 MG tablet Take 1 tablet (1 mg total) by mouth daily. For 3 weeks. Then do not take medication for one week and restart four-week cycle  21 tablet  3  . ferrous sulfate 325 (65 FE) MG tablet Take 1 tablet (325 mg total) by mouth daily.  30 tablet  3  . gabapentin (NEURONTIN) 800 MG tablet Take 1 tablet (800 mg total) by mouth 3 (three) times daily. Take one tablet by mouth in am and 2 tablets by mouth in pm  90 tablet  11  . glucose blood (ACCU-CHEK SMARTVIEW) test strip Use to check blood sugar before meals dx code 250.00  100 each  12  . insulin aspart (NOVOLOG) 100 UNIT/ML injection Please check your blood sugar prior to every meal. If your blood sugar is greater than 150 take 3 units 200 take 5 units 300 take 7 units 400 take 9 units  10 mL  3  . insulin aspart protamine-insulin aspart (NOVOLOG 70/30) (70-30) 100 UNIT/ML injection INJECT 53 UNITS EVERY MORNING AND 60 UNITS IN THE EVENING      .  Insulin Pen Needle 31G X 5 MM MISC Box of 100 - uses up to 5 times per day according to CBG  100 each  11  . isosorbide mononitrate (IMDUR) 60 MG 24 hr tablet Take 1 tablet (60 mg total) by mouth daily.  30 tablet  6  . losartan-hydrochlorothiazide (HYZAAR) 100-25 MG per tablet Take 1 tablet by mouth daily.  30 tablet  5  . metoprolol succinate (TOPROL-XL) 25 MG 24 hr tablet Take 1 tablet (25 mg total) by mouth daily.  30 tablet  6  . senna (SENOKOT) 8.6 MG tablet Take 4 tablets by mouth daily.        . simvastatin (ZOCOR) 20 MG tablet Take 1 tablet (20 mg total) by mouth at bedtime.  90 tablet  3  . buPROPion (WELLBUTRIN XL) 300 MG 24 hr tablet Take 300 mg by mouth daily.        Marland Kitchen dexlansoprazole (DEXILANT) 60 MG capsule Take 60 mg by mouth daily.        . diazepam (VALIUM) 5 MG tablet take 1  tablet by mouth every 8 hours if needed  60 tablet  2  . fluticasone (FLONASE) 50 MCG/ACT nasal spray Place 2 sprays into the nose daily.  16 g  2   Family History  Problem Relation Age of Onset  . Diabetes Mother   . Hypertension Mother   . Hyperlipidemia Mother   . Arthritis Mother   . Heart disease Father   . Diabetes Sister   . Diabetes Brother    History   Social History  . Marital Status: Married    Spouse Name: N/A    Number of Children: N/A  . Years of Education: N/A   Social History Main Topics  . Smoking status: Former Smoker    Types: Cigarettes    Quit date: 10/25/2002  . Smokeless tobacco: None  . Alcohol Use: No  . Drug Use: No  . Sexually Active: None   Other Topics Concern  . None   Social History Narrative  . None   Review of Systems: Constitutional: Endorses diaphoresis, fatigue and malaise. Denies fever, chills, appetite change.  HEENT: Endorses nasal congestion and sneezing. Denies photophobia, eye pain, redness, hearing loss, ear pain, mouth sores, trouble swallowing, neck pain, neck stiffness and tinnitus.   Respiratory: Denies SOB, DOE, cough, chest tightness,  and wheezing.   Cardiovascular: Denies chest pain, palpitations and leg swelling.  Gastrointestinal: Denies nausea, vomiting, abdominal pain, diarrhea, constipation, blood in stool and abdominal distention.  Genitourinary: Denies dysuria, urgency, frequency, hematuria, flank pain and difficulty urinating.  Musculoskeletal: Endorses pain in her hands and pain in her feet. Denies joint deformity. Skin: Denies pallor, rash and wound.  Neurological: Endorses neuropathy in her hands. Denies dizziness, seizures, syncope, weakness, light-headedness, numbness and headaches.  Psychiatric/Behavioral: Denies suicidal ideation or mood changes.  Objective:  Physical Exam: Filed Vitals:   07/24/12 1033  BP: 129/70  Pulse: 70  Temp: 98.2 F (36.8 C)  TempSrc: Oral  Height: 5\' 4"  (1.626 m)    Weight: 213 lb 8 oz (96.843 kg)  SpO2: 96%   Constitutional: Vital signs reviewed.  Patient is a well-developed and well-nourished female in no acute distress and cooperative with exam. Alert and oriented x3.  Head: Normocephalic and atraumatic Eyes: PERRL, EOMI, no scleral icterus.  Neck: Supple, Trachea midline normal ROM, No JVD, mass, or thyromegaly.  Cardiovascular: RRR, no MRG, pulses symmetric and intact bilaterally Pulmonary/Chest: CTAB, no wheezes, rales,  or rhonchi Abdominal: Obese, soft. non-tender, non-distended, bowel sounds are normal, no masses, organomegaly, or guarding present.  GU: No CVA tenderness Musculoskeletal: No joint deformities, erythema, or stiffness, ROM full and nontender. Nonpitting pedal edema bilaterally. Hematology: No cervical adenopathy.  Neurological: A&O x3, Strength is normal and symmetric bilaterally, cranial nerve II-XII are grossly intact, no focal motor deficit, sensory intact to light touch bilaterally.  Skin: Warm, dry and intact. No rash, cyanosis, or clubbing.  Psychiatric: Normal mood and affect. Speech and behavior appear normal.   Assessment & Plan:  Please refer to Problem list based A&P.

## 2012-07-24 NOTE — Patient Instructions (Signed)
Continue to take your insulin as discussed: 53u in the morning and 60u in the evening. Use sliding scale as prescribed.  Use saline nasal spray prior to using the Flonase.  Then use Flonase 2 sprays in each nostril daily.  Try going to a medical supply store and have your wrist splints evaluated.  Follow up with dr. Everardo Beals in November

## 2012-07-24 NOTE — Progress Notes (Signed)
Medical Nutrition Therapy:  Visit no. 5 this year  Appt start time:  end time:    Assessment:  Primary concerns today: Blood sugar control and weight. Weight: 213.5# today.  Not needing correction insulin every time sugar is > 150 and taking 53 units novolog mix 70/30 before breakfast and 60 units before bed.  CBGs mostly in target range with many  episodes of hypoglycemia mid-morning  Progress Towards Goal(s):  In progress   Nutritional Diagnosis:   NB-1.4 Self-monitoring deficit As related to not checking blood sugar when having symptoms of high and low blood sugar.improved.  NI-5.8.4 Inconsistent carbohydrate intake improving and needs additional reinforcement as evidenced by blood sugars and A1C of 10.7%.   NI-5.6.3 Inappropriate intake of fats (specify): excess from fried foods and pork rinds As related to insulin resistance and unintentional weight gain improving as evidenced by more blood sugars in target range.   Interventions: 1- Education: on mew meter and lancing device. Patient demonstrated ability to use it here in office.   3- Coordination of care with physician:     Monitoring/Evaluation:  Dietary intake, weight and meter download  in 8 week(s)

## 2012-07-25 DIAGNOSIS — R5381 Other malaise: Secondary | ICD-10-CM | POA: Insufficient documentation

## 2012-07-25 DIAGNOSIS — G5603 Carpal tunnel syndrome, bilateral upper limbs: Secondary | ICD-10-CM | POA: Insufficient documentation

## 2012-07-25 DIAGNOSIS — R5383 Other fatigue: Secondary | ICD-10-CM | POA: Insufficient documentation

## 2012-07-25 NOTE — Assessment & Plan Note (Signed)
BP well controlled at 129/70. She is to continue her current regimen.

## 2012-07-25 NOTE — Assessment & Plan Note (Signed)
Since changing her regimen, her sugars have not been dropping as low. She is to continue her current insulin regimen of 53u qam and 60u qhs and is to continue checking her sugars. She has an appointment with her PCP, Dr. Everardo Beals, in Nov.

## 2012-07-25 NOTE — Assessment & Plan Note (Signed)
Previous diagnosis, and the pt has wrist braces which she does not use b/c they are too tight. Current sx appear to be related. She was advised to got to a medial supply store and inquire about her brace size, if they can be fixed, or if she needs to purchase new braces.

## 2012-07-25 NOTE — Assessment & Plan Note (Addendum)
She has been experiencing this for [redacted] weeks along with nasal congestion but no fever. Her sx could possibly be associated with allergies or even with a cold. Have to r/o anemia, h/o iron def anemia, on iron supplementation. CBC w/ Hgb 12.5 and MCV 80.5.  - Nasal saline spray before using the Flonase. - Flonase 2sp each nostril daily - OTC non-drowsey antihistamine, such as Claritin or Zyrtec; pt advised to avoid Benadryl 2/2 sedating effects.

## 2012-07-26 NOTE — Progress Notes (Signed)
INTERNAL MEDICINE TEACHING ATTENDING ADDENDUM - I personally saw and evaluated the patient in this clinic visit in conjunction with the resident, Dr. Sherrine Maples. I have discussed the patient's plan of care with Dr. Sherrine Maples during this visit. I have confirmed the physical exam findings and have read and agree with the clinic note including the plan

## 2012-08-01 ENCOUNTER — Telehealth: Payer: Self-pay | Admitting: *Deleted

## 2012-08-01 NOTE — Telephone Encounter (Signed)
Pt calls and states every time she goes out of the house her blood sugar drops also she has aching in her feet and legs, desires appt, she is ask to bring her medicines and meter. appt 10/9 at 0930 dr Everardo Beals per sharonb.

## 2012-08-02 ENCOUNTER — Ambulatory Visit (INDEPENDENT_AMBULATORY_CARE_PROVIDER_SITE_OTHER): Payer: PRIVATE HEALTH INSURANCE | Admitting: Internal Medicine

## 2012-08-02 ENCOUNTER — Encounter: Payer: Self-pay | Admitting: Internal Medicine

## 2012-08-02 VITALS — BP 132/73 | HR 73 | Temp 97.9°F | Wt 213.1 lb

## 2012-08-02 DIAGNOSIS — M479 Spondylosis, unspecified: Secondary | ICD-10-CM

## 2012-08-02 DIAGNOSIS — E1165 Type 2 diabetes mellitus with hyperglycemia: Secondary | ICD-10-CM

## 2012-08-02 DIAGNOSIS — N951 Menopausal and female climacteric states: Secondary | ICD-10-CM

## 2012-08-02 DIAGNOSIS — F329 Major depressive disorder, single episode, unspecified: Secondary | ICD-10-CM

## 2012-08-02 DIAGNOSIS — F411 Generalized anxiety disorder: Secondary | ICD-10-CM

## 2012-08-02 DIAGNOSIS — F419 Anxiety disorder, unspecified: Secondary | ICD-10-CM

## 2012-08-02 DIAGNOSIS — G5600 Carpal tunnel syndrome, unspecified upper limb: Secondary | ICD-10-CM

## 2012-08-02 DIAGNOSIS — M1990 Unspecified osteoarthritis, unspecified site: Secondary | ICD-10-CM

## 2012-08-02 DIAGNOSIS — R232 Flushing: Secondary | ICD-10-CM

## 2012-08-02 DIAGNOSIS — G5603 Carpal tunnel syndrome, bilateral upper limbs: Secondary | ICD-10-CM

## 2012-08-02 LAB — GLUCOSE, CAPILLARY: Glucose-Capillary: 88 mg/dL (ref 70–99)

## 2012-08-02 MED ORDER — INSULIN ASPART 100 UNIT/ML ~~LOC~~ SOLN: 10.0000 [IU] | Freq: Three times a day (TID) | SUBCUTANEOUS | Status: DC

## 2012-08-02 MED ORDER — INSULIN GLARGINE 100 UNIT/ML ~~LOC~~ SOLN: 48.0000 [IU] | Freq: Every day | SUBCUTANEOUS | Status: DC

## 2012-08-02 MED ORDER — HYDROCODONE-ACETAMINOPHEN 10-325 MG PO TABS: 1.0000 | ORAL_TABLET | Freq: Four times a day (QID) | ORAL | Status: DC | PRN

## 2012-08-02 NOTE — Assessment & Plan Note (Signed)
Poor control with hypoglycemic episodes on insulin 70/30.  Change regimen to Lantus (pen) 48u qHS and novolog 10u TIDWC.  She will likely need more meal time insulin, but given her lows and decreased appetite, want to avoid hypoglycemia.  She will check CBGs 3-4x/day and return in 2 weeks.

## 2012-08-02 NOTE — Patient Instructions (Signed)
-  Stop taking your insulin 70/30.  Start taking Lantus 48 units before bed.  Also start taking Novolog 10units three times daily before each meal.  Be sure to eat after taking insulin.  Continue to check your blood sugars 3-4 times per day.  -Stop taking Estradiol pills, we will try to control your sugars and see if this helps your temperature regulation  -I refilled your Vicodin, be sure to call when ready for a refill  -Be sure to have your eye exam done  Please be sure to bring all of your medications with you to every visit.  Should you have any new or worsening symptoms, please be sure to call the clinic at [phone].

## 2012-08-02 NOTE — Assessment & Plan Note (Signed)
No help with estradiol.  Difficult to ascertain if d/t hypoglycemia.  Will d/c estradiol for now.  Reassess after better DM control.

## 2012-08-02 NOTE — Assessment & Plan Note (Signed)
Seems to have exacerbation in setting of difficulty DM control.  She went to [facility] yesterday, but no changes made to meds since her CBG = 52, and there was more preoccupation with that.  She is still on the same dose of wellbutrin & citalopram.  She has been without valium for 2 months, so we will continue without it.  She has no red flag behavior, but seems to be doing ok regarding panic attacks without valium.  No SI/HI.  Encouraged to return to [facility] for therapy.  Hold the course for now.

## 2012-08-02 NOTE — Progress Notes (Signed)
Subjective:   Patient ID: [name] female   DOB: [birth year] 61 y.o.   MRN: [id number]  HPI: [name] is a 61 y.o. female w/ PMH poorly controlled diabetes, hypertension, low back pain, iron deficiency anemia, and depression, who presents today for   CC: DM with low blood sugars, wrist/hand pains, generalized aches/pains, HA  Has been feeling unwell for the last month.  Describes being in a lot pain, and unable to do much of anything.  She prefers to stay home if hurting and missing church, which she doesn't like.  Starting to feel increasingly sad/weepy because of the pain.  She is increasingly frustrated.    B/l hand numbness/pain.  B/l feet bothering her.  Notes worsening back pain and tense/tight muscles.  Has braces.  HA across front & back.  Worse with hair up, but hot if hair down.    CBGs erratic.  Noted blurry vision yesterday, sweating/shaking, couldn't check CBGs.  Eventually when she got to [facility], able to check CBG = 52.  Ate full breakfast.  Doesn't exercise anymore.   Last session with psychiatrist was in September, and doesn't feel it was helpful.  She reports she was trying to find fault in others, but it was really her.    Weird dreams recently.  If she can get to sleep, then sleeps well.  Caught herself eating the sheet and tugging on covers more.  Increasing difficulty getting to sleep because mind roams.  Poor appetite, mostly eating crackers/salad.  Was feeling increasingly agitated, but now seems to be more frustrated/sadness/apathy.  No SI/HI.  No feelings of guilt.    Past Medical History  Diagnosis Date  . Hypertension   . Peripheral neuropathy   . Cervicalgia   . Low back pain syndrome   . Hyperlipidemia   . Vitamin D deficiency   . Vitamin B12 deficiency   . Iliotibial band syndrome   . Anemia   . Borderline personality disorder   . Nonorganic psychosis   . GERD (gastroesophageal reflux disease)   . Diabetes mellitus   . Depression   .  Diabetic gastroparesis   . Sleep apnea   . CAD (coronary artery disease)     Catherization 11/18/09>nonobstructive CAD , normal LV function, EF 65%  . Health maintenance examination     Colonoscopy 2009> normal; Diabetic eye exam 12/2008. No diabetic retinopathy; Mammogram 11/10> No evidence of malignancy   Current Outpatient Prescriptions  Medication Sig Dispense Refill  . ACCU-CHEK FASTCLIX LANCETS MISC 1 each by Does not apply route 3 (three) times daily before meals. Dx code 250.00  102 each  12  . amLODipine (NORVASC) 5 MG tablet Take 1 tablet (5 mg total) by mouth daily.  30 tablet  5  . aspirin 81 MG tablet Take 81 mg by mouth daily.        Marland Kitchen buPROPion (WELLBUTRIN XL) 300 MG 24 hr tablet Take 300 mg by mouth daily.        . citalopram (CELEXA) 40 MG tablet Take 40 mg by mouth daily.        Marland Kitchen dexlansoprazole (DEXILANT) 60 MG capsule Take 60 mg by mouth daily.        . diazepam (VALIUM) 5 MG tablet take 1 tablet by mouth every 8 hours if needed  60 tablet  2  . estradiol (ESTRACE) 1 MG tablet Take 1 tablet (1 mg total) by mouth daily. For 3 weeks. Then do not take medication for one  week and restart four-week cycle  21 tablet  3  . ferrous sulfate 325 (65 FE) MG tablet Take 1 tablet (325 mg total) by mouth daily.  30 tablet  3  . fluticasone (FLONASE) 50 MCG/ACT nasal spray Place 2 sprays into the nose daily.  16 g  2  . gabapentin (NEURONTIN) 800 MG tablet Take 1 tablet (800 mg total) by mouth 3 (three) times daily. Take one tablet by mouth in am and 2 tablets by mouth in pm  90 tablet  11  . glucose blood (ACCU-CHEK SMARTVIEW) test strip Use to check blood sugar before meals dx code 250.00  100 each  12  . insulin aspart (NOVOLOG) 100 UNIT/ML injection Please check your blood sugar prior to every meal. If your blood sugar is greater than 150 take 3 units 200 take 5 units 300 take 7 units 400 take 9 units  10 mL  3  . insulin aspart protamine-insulin aspart (NOVOLOG 70/30) (70-30) 100  UNIT/ML injection INJECT 53 UNITS EVERY MORNING AND 60 UNITS IN THE EVENING      . Insulin Pen Needle 31G X 5 MM MISC Box of 100 - uses up to 5 times per day according to CBG  100 each  11  . isosorbide mononitrate (IMDUR) 60 MG 24 hr tablet Take 1 tablet (60 mg total) by mouth daily.  30 tablet  6  . losartan-hydrochlorothiazide (HYZAAR) 100-25 MG per tablet Take 1 tablet by mouth daily.  30 tablet  5  . metoprolol succinate (TOPROL-XL) 25 MG 24 hr tablet Take 1 tablet (25 mg total) by mouth daily.  30 tablet  6  . senna (SENOKOT) 8.6 MG tablet Take 4 tablets by mouth daily.        . simvastatin (ZOCOR) 20 MG tablet Take 1 tablet (20 mg total) by mouth at bedtime.  90 tablet  3   Family History  Problem Relation Age of Onset  . Diabetes Mother   . Hypertension Mother   . Hyperlipidemia Mother   . Arthritis Mother   . Heart disease Father   . Diabetes Sister   . Diabetes Brother    History   Social History  . Marital Status: Married    Spouse Name: N/A    Number of Children: N/A  . Years of Education: N/A   Social History Main Topics  . Smoking status: Former Smoker    Types: Cigarettes    Quit date: 10/25/2002  . Smokeless tobacco: None  . Alcohol Use: No  . Drug Use: No  . Sexually Active: None   Other Topics Concern  . None   Social History Narrative  . None   Review of Systems: General: no fevers, chills, changes in weight Skin: no rash HEENT: no hearing changes, sore throat Pulm: no dyspnea, coughing, wheezing CV: no chest pain, palpitations, shortness of breath Abd: no abdominal pain, nausea/vomiting, diarrhea/constipation GU: no dysuria, hematuria, polyuria Neuro: no weakness   Objective:  Physical Exam: Filed Vitals:   08/02/12 0943  BP: 132/73  Pulse: 73  Temp: 97.9 F (36.6 C)  TempSrc: Oral  Weight: 213 lb 1.6 oz (96.662 kg)   Constitutional: Vital signs reviewed.  Patient is a well-developed and well-nourished woman in no acute distress and  cooperative with exam. Mouth: no erythema or exudates, MMM Eyes: PERRL, EOMI, conjunctivae normal, No scleral icterus.  Cardiovascular: RRR, S1 normal, S2 normal, no MRG, pulses symmetric and intact bilaterally Pulmonary/Chest: CTAB, no wheezes, rales, or  rhonchi Abdominal: Soft. Non-tender, non-distended, bowel sounds are normal, no masses, organomegaly, or guarding present.  Musculoskeletal: No joint deformities, erythema, or stiffness, ROM full and nontender Neurological: A&O x3, Strength is normal and symmetric bilaterally, cranial nerve II-XII are grossly intact, no focal motor deficit, sensory intact to light touch bilaterally.  Skin: Warm, dry and intact. No rash, cyanosis, or clubbing.  Psychiatric: Depressed mood and affect. speech and behavior is normal. Judgment and thought content normal.   Assessment & Plan:  Case and care discussed with Dr. Kem Kays. Please see problem oriented charting for further details. Patient to return in 2 weeks for review of CBGs.   Normal ferritin, TSH, and VitD in the last year.

## 2012-08-03 NOTE — Assessment & Plan Note (Addendum)
Refilled Norco 10-325 (changed from 10-500 in setting of FDA regulation changes) #60.  No red flag behavior.

## 2012-08-03 NOTE — Assessment & Plan Note (Signed)
Somewhat stable with wellbutrin & celexa, but difficult to discern in setting of erratic CBGs.  Has been off of valium for 2 months.  Will continue trial off of valium.

## 2012-08-03 NOTE — Assessment & Plan Note (Signed)
Continue mgmt with brace for now, if persists, consider neuro referral for nerve testing vs hand surgery for carpel tunnel release

## 2012-08-16 ENCOUNTER — Encounter: Payer: Self-pay | Admitting: Internal Medicine

## 2012-08-16 ENCOUNTER — Ambulatory Visit (INDEPENDENT_AMBULATORY_CARE_PROVIDER_SITE_OTHER): Payer: PRIVATE HEALTH INSURANCE | Admitting: Internal Medicine

## 2012-08-16 VITALS — BP 117/66 | HR 76 | Temp 98.1°F | Wt 210.5 lb

## 2012-08-16 DIAGNOSIS — Z23 Encounter for immunization: Secondary | ICD-10-CM

## 2012-08-16 DIAGNOSIS — M1990 Unspecified osteoarthritis, unspecified site: Secondary | ICD-10-CM

## 2012-08-16 DIAGNOSIS — E1165 Type 2 diabetes mellitus with hyperglycemia: Secondary | ICD-10-CM

## 2012-08-16 MED ORDER — HYDROCODONE-ACETAMINOPHEN 10-325 MG PO TABS: 1.0000 | ORAL_TABLET | Freq: Four times a day (QID) | ORAL | Status: DC | PRN

## 2012-08-16 NOTE — Assessment & Plan Note (Signed)
CBGs significantly improved on Lantus 48u qHS and novolog 10u TIDWC.  She is still nervous about low blood sugars, and is hesitant to increase insulin at this time. Review of CBGs suggests lowest = 80 and highest 185.  A1c = 6.9 improved from 10.7 three months prior.  Continue current therapy, continue diet/lifestyle modifications. RTC in 3 months.

## 2012-08-16 NOTE — Progress Notes (Signed)
Subjective:   Patient ID: [name] female   DOB: 03-15-1951 61 y.o.   MRN: [id number]  HPI: [name] is a 61 y.o. female w/ PMH poorly controlled diabetes, hypertension, low back pain, iron deficiency anemia, and depression, who presents today for DM follow up.  On 08/02/12, her regimen was changed to lantus 38u qHS and novolog 10u TIDWC.  Her CBGs are significantly improved, and no hypoglycemic events since regimen was changed, though she is still nervous about hypoglycemia.  She has tried to improve her diet. 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   Past Medical History  Diagnosis Date  . Hypertension   . Peripheral neuropathy   . Cervicalgia   . Low back pain syndrome   . Hyperlipidemia   . Vitamin D deficiency   . Vitamin B12 deficiency   . Iliotibial band syndrome   . Anemia   . Borderline personality disorder   . Nonorganic psychosis   . GERD (gastroesophageal reflux disease)   . Diabetes mellitus   . Depression   . Diabetic gastroparesis   . Sleep apnea   . CAD (coronary artery disease)     Catherization 11/18/09>nonobstructive CAD , normal LV function, EF 65%  . Health maintenance examination     Colonoscopy 2009> normal; Diabetic eye exam 12/2008. No diabetic retinopathy; Mammogram 11/10> No evidence of malignancy   Current Outpatient Prescriptions  Medication Sig Dispense Refill  . ACCU-CHEK FASTCLIX LANCETS MISC 1 each by Does not apply route 3 (three) times daily before meals. Dx code 250.00  102 each  12  . amLODipine (NORVASC) 5 MG tablet Take 1 tablet (5 mg total) by mouth daily.  30 tablet  5  . aspirin 81 MG tablet Take 81 mg by mouth daily.        Marland Kitchen buPROPion (WELLBUTRIN XL) 300 MG 24 hr tablet Take 300 mg by mouth daily.        . citalopram (CELEXA) 40 MG tablet Take 40 mg by mouth daily.        Marland Kitchen dexlansoprazole (DEXILANT) 60 MG capsule Take 60 mg by mouth daily.        . ferrous sulfate 325  (65 FE) MG tablet Take 1 tablet (325 mg total) by mouth daily.  30 tablet  3  . fluticasone (FLONASE) 50 MCG/ACT nasal spray Place 2 sprays into the nose daily.  16 g  2  . gabapentin (NEURONTIN) 800 MG tablet Take 1 tablet (800 mg total) by mouth 3 (three) times daily. Take one tablet by mouth in am and 2 tablets by mouth in pm  90 tablet  11  . glucose blood (ACCU-CHEK SMARTVIEW) test strip Use to check blood sugar before meals dx code 250.00  100 each  12  . HYDROcodone-acetaminophen (NORCO) 10-325 MG per tablet Take 1 tablet by mouth every 6 (six) hours as needed for pain.  60 tablet  0  . insulin aspart (NOVOLOG) 100 UNIT/ML injection Inject 10 Units into the skin 3 (three) times daily before meals.  10 mL  3  . insulin glargine (LANTUS SOLOSTAR) 100 UNIT/ML injection Inject 48 Units into the skin at bedtime.  3 pen  12  . Insulin Pen Needle 31G X 5 MM MISC Box of 100 - uses up to 5 times per day according to CBG  100 each  11  . isosorbide mononitrate (IMDUR) 60 MG 24 hr tablet Take 1  tablet (60 mg total) by mouth daily.  30 tablet  6  . losartan-hydrochlorothiazide (HYZAAR) 100-25 MG per tablet Take 1 tablet by mouth daily.  30 tablet  5  . metoprolol succinate (TOPROL-XL) 25 MG 24 hr tablet Take 1 tablet (25 mg total) by mouth daily.  30 tablet  6  . senna (SENOKOT) 8.6 MG tablet Take 4 tablets by mouth daily.        . simvastatin (ZOCOR) 20 MG tablet Take 1 tablet (20 mg total) by mouth at bedtime.  90 tablet  3   Family History  Problem Relation Age of Onset  . Diabetes Mother   . Hypertension Mother   . Hyperlipidemia Mother   . Arthritis Mother   . Heart disease Father   . Diabetes Sister   . Diabetes Brother    History   Social History  . Marital Status: Married    Spouse Name: N/A    Number of Children: N/A  . Years of Education: N/A   Social History Main Topics  . Smoking status: Former Smoker    Types: Cigarettes    Quit date: 10/25/2002  . Smokeless tobacco: None    . Alcohol Use: No  . Drug Use: No  . Sexually Active: None   Other Topics Concern  . None   Social History Narrative  . None   Review of Systems: General: no fevers, chills, changes in weight  Skin: no rash  HEENT: no hearing changes, sore throat  Pulm: no dyspnea, coughing, wheezing  CV: no chest pain, palpitations, shortness of breath  Abd: no abdominal pain, nausea/vomiting, diarrhea/constipation  GU: no dysuria, hematuria, polyuria  Neuro: no weakness   Objective:  Physical Exam: Filed Vitals:   08/16/12 1344  BP: 117/66  Pulse: 76  Temp: 98.1 F (36.7 C)  TempSrc: Oral  Weight: 210 lb 8 oz (95.482 kg)  SpO2: 97%   Constitutional: Vital signs reviewed. Patient is a well-developed and well-nourished woman in no acute distress and cooperative with exam.  Mouth: no erythema or exudates, MMM  Eyes: PERRL, EOMI, conjunctivae normal, No scleral icterus.  Cardiovascular: RRR, S1 normal, S2 normal, no MRG, pulses symmetric and intact bilaterally  Pulmonary/Chest: CTAB, no wheezes, rales, or rhonchi  Abdominal: Soft. Non-tender, non-distended, bowel sounds are normal, no masses, organomegaly, or guarding present.  Neurological: A&O x3 Skin: Warm, dry and intact. No rash, cyanosis, or clubbing.  Psychiatric: Normal mood and affect. speech and behavior is normal. Judgment and thought content normal.    Assessment & Plan:   Case and care discussed with Dr. Dalphine Handing. Please see problem oriented charting for further details. Patient to return in 3 months for routine follow up.

## 2012-08-16 NOTE — Assessment & Plan Note (Signed)
Script for [employer] #60 written and left with triage to be picked up on 09/03/12.

## 2012-08-16 NOTE — Patient Instructions (Signed)
-  KEEP UP THE GREAT WORK!  Continue working on your diet and exercise - you're right that your A1c improved so much because of the lows you were having, but review of your blood sugars looks much better!  -For the tension behind your neck, try putting 2 tennis balls in a sock, and resting your neck on that while laying down for 10-70minutes prior to bedtime.  Please be sure to bring all of your medications with you to every visit.  Should you have any new or worsening symptoms, please be sure to call the clinic at [phone].

## 2012-08-30 ENCOUNTER — Encounter: Payer: PRIVATE HEALTH INSURANCE | Admitting: Internal Medicine

## 2012-11-01 ENCOUNTER — Encounter: Payer: PRIVATE HEALTH INSURANCE | Admitting: Internal Medicine

## 2012-11-08 ENCOUNTER — Emergency Department (HOSPITAL_COMMUNITY): Payer: PRIVATE HEALTH INSURANCE

## 2012-11-08 ENCOUNTER — Encounter (HOSPITAL_COMMUNITY): Payer: Self-pay | Admitting: *Deleted

## 2012-11-08 ENCOUNTER — Ambulatory Visit (HOSPITAL_COMMUNITY)
Admission: RE | Admit: 2012-11-08 | Discharge: 2012-11-08 | Disposition: A | Discharge status: Home / Self Care | Payer: PRIVATE HEALTH INSURANCE | Source: Ambulatory Visit | Attending: Internal Medicine | Admitting: Internal Medicine

## 2012-11-08 ENCOUNTER — Encounter: Payer: Self-pay | Admitting: Internal Medicine

## 2012-11-08 ENCOUNTER — Observation Stay (HOSPITAL_COMMUNITY)
Admission: EM | Admit: 2012-11-08 | Discharge: 2012-11-09 | Disposition: A | Discharge status: Home / Self Care | Payer: PRIVATE HEALTH INSURANCE | Attending: Internal Medicine | Admitting: Internal Medicine

## 2012-11-08 ENCOUNTER — Other Ambulatory Visit: Payer: Self-pay

## 2012-11-08 ENCOUNTER — Ambulatory Visit (INDEPENDENT_AMBULATORY_CARE_PROVIDER_SITE_OTHER): Payer: PRIVATE HEALTH INSURANCE | Admitting: Internal Medicine

## 2012-11-08 VITALS — BP 132/68 | HR 82 | Temp 98.5°F | Wt 212.9 lb

## 2012-11-08 DIAGNOSIS — IMO0002 Reserved for concepts with insufficient information to code with codable children: Secondary | ICD-10-CM | POA: Diagnosis present

## 2012-11-08 DIAGNOSIS — I129 Hypertensive chronic kidney disease with stage 1 through stage 4 chronic kidney disease, or unspecified chronic kidney disease: Secondary | ICD-10-CM | POA: Insufficient documentation

## 2012-11-08 DIAGNOSIS — Z79899 Other long term (current) drug therapy: Secondary | ICD-10-CM | POA: Insufficient documentation

## 2012-11-08 DIAGNOSIS — N189 Chronic kidney disease, unspecified: Secondary | ICD-10-CM | POA: Insufficient documentation

## 2012-11-08 DIAGNOSIS — E1165 Type 2 diabetes mellitus with hyperglycemia: Secondary | ICD-10-CM | POA: Diagnosis present

## 2012-11-08 DIAGNOSIS — R159 Full incontinence of feces: Secondary | ICD-10-CM

## 2012-11-08 DIAGNOSIS — I10 Essential (primary) hypertension: Secondary | ICD-10-CM | POA: Diagnosis present

## 2012-11-08 DIAGNOSIS — K219 Gastro-esophageal reflux disease without esophagitis: Secondary | ICD-10-CM | POA: Diagnosis present

## 2012-11-08 DIAGNOSIS — I2510 Atherosclerotic heart disease of native coronary artery without angina pectoris: Secondary | ICD-10-CM

## 2012-11-08 DIAGNOSIS — E1142 Type 2 diabetes mellitus with diabetic polyneuropathy: Secondary | ICD-10-CM

## 2012-11-08 DIAGNOSIS — R0789 Other chest pain: Secondary | ICD-10-CM | POA: Diagnosis present

## 2012-11-08 DIAGNOSIS — N179 Acute kidney failure, unspecified: Secondary | ICD-10-CM | POA: Diagnosis present

## 2012-11-08 DIAGNOSIS — E119 Type 2 diabetes mellitus without complications: Secondary | ICD-10-CM

## 2012-11-08 DIAGNOSIS — G5600 Carpal tunnel syndrome, unspecified upper limb: Secondary | ICD-10-CM

## 2012-11-08 DIAGNOSIS — R5383 Other fatigue: Secondary | ICD-10-CM

## 2012-11-08 DIAGNOSIS — F329 Major depressive disorder, single episode, unspecified: Secondary | ICD-10-CM

## 2012-11-08 DIAGNOSIS — G609 Hereditary and idiopathic neuropathy, unspecified: Secondary | ICD-10-CM | POA: Diagnosis present

## 2012-11-08 DIAGNOSIS — E1149 Type 2 diabetes mellitus with other diabetic neurological complication: Secondary | ICD-10-CM | POA: Insufficient documentation

## 2012-11-08 DIAGNOSIS — R079 Chest pain, unspecified: Secondary | ICD-10-CM

## 2012-11-08 DIAGNOSIS — E785 Hyperlipidemia, unspecified: Secondary | ICD-10-CM | POA: Diagnosis present

## 2012-11-08 DIAGNOSIS — G5603 Carpal tunnel syndrome, bilateral upper limbs: Secondary | ICD-10-CM

## 2012-11-08 DIAGNOSIS — M1990 Unspecified osteoarthritis, unspecified site: Secondary | ICD-10-CM

## 2012-11-08 DIAGNOSIS — K3184 Gastroparesis: Secondary | ICD-10-CM

## 2012-11-08 DIAGNOSIS — Z794 Long term (current) use of insulin: Secondary | ICD-10-CM | POA: Insufficient documentation

## 2012-11-08 DIAGNOSIS — D649 Anemia, unspecified: Secondary | ICD-10-CM

## 2012-11-08 DIAGNOSIS — F32A Depression, unspecified: Secondary | ICD-10-CM | POA: Diagnosis present

## 2012-11-08 LAB — BASIC METABOLIC PANEL
BUN: 21 mg/dL (ref 6–23)
CO2: 29 mEq/L (ref 19–32)
Chloride: 103 mEq/L (ref 96–112)
Glucose, Bld: 99 mg/dL (ref 70–99)
Potassium: 4.5 mEq/L (ref 3.5–5.1)

## 2012-11-08 LAB — CBC
HCT: 36.7 % (ref 36.0–46.0)
Hemoglobin: 11.7 g/dL — ABNORMAL LOW (ref 12.0–15.0)
WBC: 8.3 10*3/uL (ref 4.0–10.5)

## 2012-11-08 LAB — POCT GLYCOSYLATED HEMOGLOBIN (HGB A1C): Hemoglobin A1C: 7.8

## 2012-11-08 MED ORDER — ASPIRIN 81 MG PO CHEW
324.0000 mg | CHEWABLE_TABLET | Freq: Once | ORAL | Status: AC
  Administered 2012-11-08: 324 mg via ORAL
  Filled 2012-11-08: qty 4

## 2012-11-08 MED ORDER — METOPROLOL SUCCINATE ER 25 MG PO TB24
25.0000 mg | ORAL_TABLET | Freq: Every day | ORAL | Status: DC
  Administered 2012-11-09: 25 mg via ORAL
  Filled 2012-11-08: qty 1

## 2012-11-08 MED ORDER — GABAPENTIN 800 MG PO TABS
800.0000 mg | ORAL_TABLET | Freq: Three times a day (TID) | ORAL | Status: DC
  Filled 2012-11-08: qty 1

## 2012-11-08 MED ORDER — INSULIN ASPART 100 UNIT/ML ~~LOC~~ SOLN
10.0000 [IU] | Freq: Three times a day (TID) | SUBCUTANEOUS | Status: DC
  Administered 2012-11-09: 10 [IU] via SUBCUTANEOUS

## 2012-11-08 MED ORDER — SODIUM CHLORIDE 0.9 % IJ SOLN
3.0000 mL | Freq: Two times a day (BID) | INTRAMUSCULAR | Status: DC
  Administered 2012-11-08 – 2012-11-09 (×2): 3 mL via INTRAVENOUS

## 2012-11-08 MED ORDER — ACETAMINOPHEN 325 MG PO TABS: 650.0000 mg | ORAL_TABLET | Freq: Four times a day (QID) | ORAL | Status: DC | PRN

## 2012-11-08 MED ORDER — LOSARTAN POTASSIUM-HCTZ 100-25 MG PO TABS: 1.0000 | ORAL_TABLET | Freq: Every day | ORAL | Status: DC

## 2012-11-08 MED ORDER — SENNA 8.6 MG PO TABS
1.0000 | ORAL_TABLET | Freq: Two times a day (BID) | ORAL | Status: DC
  Administered 2012-11-08 – 2012-11-09 (×2): 8.6 mg via ORAL
  Filled 2012-11-08 (×3): qty 1

## 2012-11-08 MED ORDER — LOSARTAN POTASSIUM 50 MG PO TABS
100.0000 mg | ORAL_TABLET | Freq: Every day | ORAL | Status: DC
  Administered 2012-11-09: 100 mg via ORAL
  Filled 2012-11-08: qty 2

## 2012-11-08 MED ORDER — ZOLPIDEM TARTRATE 5 MG PO TABS
5.0000 mg | ORAL_TABLET | Freq: Every evening | ORAL | Status: DC | PRN
  Administered 2012-11-09: 5 mg via ORAL
  Filled 2012-11-08: qty 1

## 2012-11-08 MED ORDER — GABAPENTIN 400 MG PO CAPS
800.0000 mg | ORAL_CAPSULE | Freq: Three times a day (TID) | ORAL | Status: DC
  Administered 2012-11-08 – 2012-11-09 (×2): 800 mg via ORAL
  Filled 2012-11-08 (×4): qty 2

## 2012-11-08 MED ORDER — FLUTICASONE PROPIONATE 50 MCG/ACT NA SUSP
2.0000 | Freq: Every day | NASAL | Status: DC
  Filled 2012-11-08: qty 16

## 2012-11-08 MED ORDER — ACETAMINOPHEN 650 MG RE SUPP: 650.0000 mg | Freq: Four times a day (QID) | RECTAL | Status: DC | PRN

## 2012-11-08 MED ORDER — GI COCKTAIL ~~LOC~~
30.0000 mL | Freq: Once | ORAL | Status: AC
  Administered 2012-11-08: 30 mL via ORAL
  Filled 2012-11-08: qty 30

## 2012-11-08 MED ORDER — OXYCODONE-ACETAMINOPHEN 5-325 MG PO TABS
2.0000 | ORAL_TABLET | ORAL | Status: AC
  Administered 2012-11-08: 2 via ORAL
  Filled 2012-11-08: qty 2

## 2012-11-08 MED ORDER — ONDANSETRON HCL 4 MG/2ML IJ SOLN: 4.0000 mg | Freq: Four times a day (QID) | INTRAMUSCULAR | Status: DC | PRN

## 2012-11-08 MED ORDER — ISOSORBIDE MONONITRATE ER 60 MG PO TB24
60.0000 mg | ORAL_TABLET | Freq: Every day | ORAL | Status: DC
  Administered 2012-11-09: 60 mg via ORAL
  Filled 2012-11-08: qty 1

## 2012-11-08 MED ORDER — CITALOPRAM HYDROBROMIDE 40 MG PO TABS
40.0000 mg | ORAL_TABLET | Freq: Every day | ORAL | Status: DC
  Filled 2012-11-08: qty 1

## 2012-11-08 MED ORDER — BUPROPION HCL ER (XL) 300 MG PO TB24
300.0000 mg | ORAL_TABLET | Freq: Every day | ORAL | Status: DC
  Administered 2012-11-09: 300 mg via ORAL
  Filled 2012-11-08: qty 1

## 2012-11-08 MED ORDER — ENOXAPARIN SODIUM 40 MG/0.4ML ~~LOC~~ SOLN
40.0000 mg | SUBCUTANEOUS | Status: DC
  Administered 2012-11-08: 40 mg via SUBCUTANEOUS
  Filled 2012-11-08 (×2): qty 0.4

## 2012-11-08 MED ORDER — INSULIN GLARGINE 100 UNIT/ML ~~LOC~~ SOLN: 50.0000 [IU] | Freq: Every day | SUBCUTANEOUS | Status: DC

## 2012-11-08 MED ORDER — PANTOPRAZOLE SODIUM 40 MG PO TBEC
40.0000 mg | DELAYED_RELEASE_TABLET | Freq: Every day | ORAL | Status: DC
  Administered 2012-11-08: 40 mg via ORAL
  Filled 2012-11-08: qty 1

## 2012-11-08 MED ORDER — DOCUSATE SODIUM 100 MG PO CAPS
100.0000 mg | ORAL_CAPSULE | Freq: Two times a day (BID) | ORAL | Status: DC
  Administered 2012-11-08 – 2012-11-09 (×2): 100 mg via ORAL
  Filled 2012-11-08 (×3): qty 1

## 2012-11-08 MED ORDER — SIMVASTATIN 20 MG PO TABS
20.0000 mg | ORAL_TABLET | Freq: Every day | ORAL | Status: DC
  Administered 2012-11-08: 20 mg via ORAL
  Filled 2012-11-08 (×2): qty 1

## 2012-11-08 MED ORDER — FERROUS SULFATE 325 (65 FE) MG PO TABS
325.0000 mg | ORAL_TABLET | Freq: Every day | ORAL | Status: DC
  Filled 2012-11-08: qty 1

## 2012-11-08 MED ORDER — ASPIRIN 81 MG PO TABS: 81.0000 mg | ORAL_TABLET | Freq: Every day | ORAL | Status: DC

## 2012-11-08 MED ORDER — ONDANSETRON HCL 4 MG PO TABS: 4.0000 mg | ORAL_TABLET | Freq: Four times a day (QID) | ORAL | Status: DC | PRN

## 2012-11-08 MED ORDER — HYDROCODONE-ACETAMINOPHEN 5-325 MG PO TABS
1.0000 | ORAL_TABLET | ORAL | Status: DC | PRN
  Administered 2012-11-08 – 2012-11-09 (×3): 2 via ORAL
  Filled 2012-11-08 (×3): qty 2

## 2012-11-08 MED ORDER — ASPIRIN EC 81 MG PO TBEC
81.0000 mg | DELAYED_RELEASE_TABLET | Freq: Every day | ORAL | Status: DC
  Administered 2012-11-09: 81 mg via ORAL
  Filled 2012-11-08: qty 1

## 2012-11-08 MED ORDER — NITROGLYCERIN 0.4 MG SL SUBL
0.4000 mg | SUBLINGUAL_TABLET | SUBLINGUAL | Status: DC | PRN
  Administered 2012-11-08: 0.4 mg via SUBLINGUAL
  Filled 2012-11-08: qty 25

## 2012-11-08 MED ORDER — AMLODIPINE BESYLATE 5 MG PO TABS
5.0000 mg | ORAL_TABLET | Freq: Every day | ORAL | Status: DC
  Administered 2012-11-08 – 2012-11-09 (×2): 5 mg via ORAL
  Filled 2012-11-08 (×2): qty 1

## 2012-11-08 MED ORDER — HYDROCHLOROTHIAZIDE 25 MG PO TABS
25.0000 mg | ORAL_TABLET | Freq: Every day | ORAL | Status: DC
  Administered 2012-11-09: 25 mg via ORAL
  Filled 2012-11-08: qty 1

## 2012-11-08 NOTE — ED Notes (Signed)
Pt is here with midsternal chest pain since Friday.  No sob or nausea.  Pt states pain will radiate into left chest.  Pt states sensation of burning and pressure

## 2012-11-08 NOTE — H&P (Signed)
Internal Medicine Teaching Service Resident Admission Note Date: 11/08/2012  Patient name: [name] Medical record number: [id number] Date of birth: 05/17/1951 Age: 62 y.o. Gender: female PCP: [name] Gardner, MD  Medical Service: Internal Medicine Teaching Service  I have reviewed the note by [name] Dick MS 4 and was present during the interview and physical exam.  Please see below for findings, assessment, and plan.  Chief Complaint: chest pain   History of Present Illness: 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  She presented to clinic with complaints of tight burning chest pain for the past 5 days.  She reports that the pain started gradually and thinks that she may have been shopping but is unclear when it exactly started.  States that the pain is exacerbated by movement and is relieved somewhat by Vicodin.  She reports compliance with medications including her daily PPI therapy and antihypertensive regimen. She further states that this pain feels exactly like the chest pain experienced on several occasions in 2011.  At that time, she was evaluated by Cardiology with subsequent catheterization revealing non obstruction CAD, normal LV function with EF 65% and otherwise no objective evidence of ischemia.  Meds:  No current facility-administered medications on file prior to encounter.   Current Outpatient Prescriptions on File Prior to Encounter  Medication Sig Dispense Refill  . amLODipine (NORVASC) 5 MG tablet Take 1 tablet (5 mg total) by mouth daily.  30 tablet  5  . aspirin 81 MG tablet Take 81 mg by mouth daily.        Marland Kitchen buPROPion (WELLBUTRIN XL) 300 MG 24 hr tablet Take 300 mg by mouth daily.        . citalopram (CELEXA) 40 MG tablet Take 40 mg by mouth daily.          Marland Kitchen dexlansoprazole (DEXILANT) 60 MG capsule Take 60 mg by mouth daily.        . ferrous sulfate 325 (65 FE) MG tablet Take 1 tablet (325 mg total) by mouth daily.  30 tablet  3  . fluticasone (FLONASE) 50 MCG/ACT nasal spray Place 2 sprays into the nose daily.  16 g  2  . gabapentin (NEURONTIN) 800 MG tablet Take 1 tablet (800 mg total) by mouth 3 (three) times daily. Take one tablet by mouth in am and 2 tablets by mouth in pm  90 tablet  11  . HYDROcodone-acetaminophen (NORCO) 10-325 MG per tablet Take 1 tablet by mouth every 6 (six) hours as needed for pain.  60 tablet  0  . insulin aspart (NOVOLOG) 100 UNIT/ML injection Inject 10 Units into the skin 3 (three) times daily before meals.  10 mL  3  . insulin glargine (LANTUS SOLOSTAR) 100 UNIT/ML injection Inject 50 Units into the skin at bedtime.  3 pen  12  . isosorbide mononitrate (IMDUR) 60 MG 24 hr tablet Take 1 tablet (60 mg total) by mouth daily.  30 tablet  6  . losartan-hydrochlorothiazide (HYZAAR) 100-25 MG per tablet Take 1 tablet by mouth daily.  30 tablet  5  . metoprolol succinate (TOPROL-XL) 25 MG 24 hr tablet Take 1 tablet (25 mg total) by mouth daily.  30 tablet  6  . senna (SENOKOT) 8.6 MG tablet Take 4 tablets by mouth daily.        . simvastatin (  ZOCOR) 20 MG tablet Take 1 tablet (20 mg total) by mouth at bedtime.  90 tablet  3  . ACCU-CHEK FASTCLIX LANCETS MISC 1 each by Does not apply route 3 (three) times daily before meals. Dx code 250.00  102 each  12  . glucose blood (ACCU-CHEK SMARTVIEW) test strip Use to check blood sugar before meals dx code 250.00  100 each  12  . Insulin Pen Needle 31G X 5 MM MISC Box of 100 - uses up to 5 times per day according to CBG  100 each  11    Allergies: Allergies as of 11/08/2012 - Review Complete 11/08/2012  Allergen Reaction Noted  . Indomethacin Diarrhea 03/11/2011  . Penicillins Hives   . Cimetidine    . Sulfonamide derivatives      Past Medical History: Medical Student note  reviewed  Family History: Medical Student note reviewed  Social History: Medical Student note reviewed  Surgical History: Medical Student note reviewed  Review of System: Medical Student note reviewed  Physical Exam: Blood pressure 138/69, pulse 66, temperature 98.3 F (36.8 C), temperature source Oral, resp. rate 18, SpO2 97.00%. General: Well-developed, well-nourished, AA female, in no acute distress HEENT: Normocephalic, atraumatic, PERRLA, EOMI, Moist mucous membranes, Oropharynx nonerythematous Neck: supple, no masses, no carotid Bruits, no JVD appreciated. Lungs: Normal respiratory effort. Clear to auscultation bilaterally from apices to bases without crackles or wheezes appreciated. Heart: normal rate, regular rhythm, normal S1 and S2, no gallop, murmur, or rubs appreciated. Abdomen: BS normoactive. Soft, Nondistended, non-tender. No masses or organomegaly appreciated. Extremities: trace pretibial edema, distal pulses intact Neurologic: grossly non-focal, alert and oriented x3, appropriate and cooperative throughout examination.   Labs: Reviewed as noted in the Electronic Record  Imaging: Reviewed as noted in the Electronic Record  Assessment & Plan by Problem: 1. Chest pain: in setting of non-obstructive CAD and coronary aneurysm, pt with cardiac risk factors of hypertension, hyperlipidemia, and diabetes. Although unstable angina and ACS is the leading concern, thus far cardiac enzymes and EKG without evidence of ischemia. TIMI score of 3 indicating 13% risk of MI. Cardiac catheterization November 18, 2009 revealed nonobstructive coronary disease. Circumflex 50%, distal RCA 60-70%, normal LV function, ejection fraction 65%. Myoview 2011 was negative for inducible ischemia.  Differential includes pericarditis less likely as pt w/o pleuritic type chest pain, aortic dissection less likely given fairly controlled hypertension and no widening of mediastinum or increased aortic  prominence on CXR, pt w/o reports of vomiting and no air in mediastinum on CXR to indicate esophageal rupture, no signs and symptoms consistent with gall bladder etiology. Prior EGD without evidence of peptic ulcer disease and pt on daily proton pump inhibitor. In addition pulmonary embolism unlikely given Geneva Score of 0 and pt without signs and symptoms including no shortness of breath, tachycardia or lower extremity edema. Her discomfort likely to be secondary to MSK etiology given positional pain. Pt on outpt medical management which includes aspirin, metoprolol, amlodipine (although pt reports forgetting to take this for some time now), IMDUR, simvastatin, losartan-HCTZ -cycle cardiac enzymes -cont current medical management -consult to Cardiology for further recommendations    2. Diabetes Mellitus with peripheral neuropathy and gastroparesis: fair control with HgbA1c 7.8 on Lantus 50 Units qhs, Novolog 10 units with meals. Takes gabapentin 800 mg tid for neuropathy. -cont home regimen of insulin and gabapentin -cont PPI and stool softners  3. GERD and gastroparesis: takes Dexilant 60 mg qd, followed by [facility] -cont outpt PPI  4.  Hyperlipidemia: -cont statin  5. Hypertension: on BB, CCB, nitrate, ACEi and thiazide -cont Toprol XL 25 mg qd, Amlodipine 5 mg qd, IMDUR 60 mg qd, losartan-hct 100-25mg  qd  6. Anxiety and Depression: stable, managed by psychiatry -cont Celexa 40 mg qd -cont Wellbutrin XL 300 mg qd   Signed: Kristie Cowman 11/08/2012, 5:49 PM

## 2012-11-08 NOTE — Assessment & Plan Note (Signed)
HbA1c up to 7.8 but on review of glucometer, much fewer hypoglycemic events.  For now suggest increasing lantus to 50u qHS continue novolog 10u TIDWC, but will need to properly readdress after hospital evaluation of chest pain.

## 2012-11-08 NOTE — Assessment & Plan Note (Signed)
Given history of CAD and current persistent chest pain, patient sent to ED for further evaluation.  EKG in clinic did not suggest any changes.

## 2012-11-08 NOTE — ED Notes (Signed)
Called report to unit 3000

## 2012-11-08 NOTE — Progress Notes (Signed)
Subjective:   Patient ID: [name] female   DOB: Nov 12, 1950 62 y.o.   MRN: [id number]  HPI: DianaJahdai F [name] is a 62 y.o. woman with HTN & uncontrolled DM presents for follow up.  Today she reports chest pain- burning, pressure, sharpness, feels like something is blocked; chest pain intermittent since last year, but constant for last 3-4 days (Friday); no SOB or cough; substernal, radiates to left, but not down arm or up neck; no increased burping or belching; rates 8/10 currently, was 10/10 yesterday, better with sleep; unable to ID worsening factors; has tried dexilant, gas pills, alkaseltzer; no recent changes in meds or stressors; feels sore with pressure, similar to when she had chest pain prior to catheterization.  Of note, : 11/18/09 Cath:RCA 60-70%, medical tx, normal LVF; 3/11 Echo:normal LVF; h/o OSA  Cardiac Risk Factors: Family History - CAD, History of Smoking, Hypertension, IDDM Type 2, Lipids  Caffeine/Decaff Intake: none  In April 2011 had stress test -Overall Impression - normal  Regarding DM, not as many lows as before (60s), taking insulin as prescribed.  Needs to fill amlodipine.  Continues to complain of b/l wrist pain with numbness and tingling.   Past Medical History  Diagnosis Date  . Hypertension   . Peripheral neuropathy   . Cervicalgia   . Low back pain syndrome   . Hyperlipidemia   . Vitamin D deficiency   . Vitamin B12 deficiency   . Iliotibial band syndrome   . Anemia   . Borderline personality disorder   . Nonorganic psychosis   . GERD (gastroesophageal reflux disease)   . Diabetes mellitus   . Depression   . Diabetic gastroparesis   . Sleep apnea   . CAD (coronary artery disease)     Catherization 11/18/09>nonobstructive CAD , normal LV function, EF 65%  . Health maintenance examination     Colonoscopy 2009> normal; Diabetic eye exam 12/2008. No diabetic retinopathy; Mammogram 11/10> No evidence of malignancy   Current Outpatient  Prescriptions  Medication Sig Dispense Refill  . ACCU-CHEK FASTCLIX LANCETS MISC 1 each by Does not apply route 3 (three) times daily before meals. Dx code 250.00  102 each  12  . amLODipine (NORVASC) 5 MG tablet Take 1 tablet (5 mg total) by mouth daily.  30 tablet  5  . aspirin 81 MG tablet Take 81 mg by mouth daily.        Marland Kitchen buPROPion (WELLBUTRIN XL) 300 MG 24 hr tablet Take 300 mg by mouth daily.        . citalopram (CELEXA) 40 MG tablet Take 40 mg by mouth daily.        Marland Kitchen dexlansoprazole (DEXILANT) 60 MG capsule Take 60 mg by mouth daily.        . ferrous sulfate 325 (65 FE) MG tablet Take 1 tablet (325 mg total) by mouth daily.  30 tablet  3  . fluticasone (FLONASE) 50 MCG/ACT nasal spray Place 2 sprays into the nose daily.  16 g  2  . gabapentin (NEURONTIN) 800 MG tablet Take 1 tablet (800 mg total) by mouth 3 (three) times daily. Take one tablet by mouth in am and 2 tablets by mouth in pm  90 tablet  11  . glucose blood (ACCU-CHEK SMARTVIEW) test strip Use to check blood sugar before meals dx code 250.00  100 each  12  . HYDROcodone-acetaminophen (NORCO) 10-325 MG per tablet Take 1 tablet by mouth every 6 (six) hours as needed for  pain.  60 tablet  0  . insulin aspart (NOVOLOG) 100 UNIT/ML injection Inject 10 Units into the skin 3 (three) times daily before meals.  10 mL  3  . insulin glargine (LANTUS SOLOSTAR) 100 UNIT/ML injection Inject 48 Units into the skin at bedtime.  3 pen  12  . Insulin Pen Needle 31G X 5 MM MISC Box of 100 - uses up to 5 times per day according to CBG  100 each  11  . isosorbide mononitrate (IMDUR) 60 MG 24 hr tablet Take 1 tablet (60 mg total) by mouth daily.  30 tablet  6  . losartan-hydrochlorothiazide (HYZAAR) 100-25 MG per tablet Take 1 tablet by mouth daily.  30 tablet  5  . metoprolol succinate (TOPROL-XL) 25 MG 24 hr tablet Take 1 tablet (25 mg total) by mouth daily.  30 tablet  6  . senna (SENOKOT) 8.6 MG tablet Take 4 tablets by mouth daily.        .  simvastatin (ZOCOR) 20 MG tablet Take 1 tablet (20 mg total) by mouth at bedtime.  90 tablet  3   Family History  Problem Relation Age of Onset  . Diabetes Mother   . Hypertension Mother   . Hyperlipidemia Mother   . Arthritis Mother   . Heart disease Father   . Diabetes Sister   . Diabetes Brother    History   Social History  . Marital Status: Married    Spouse Name: N/A    Number of Children: N/A  . Years of Education: N/A   Social History Main Topics  . Smoking status: Former Smoker    Types: Cigarettes    Quit date: 10/25/2002  . Smokeless tobacco: None  . Alcohol Use: No  . Drug Use: No  . Sexually Active: None   Other Topics Concern  . None   Social History Narrative  . None   Review of Systems: General: no fevers, chills, changes in weight, changes in appetite Skin: no rash HEENT: no blurry vision, hearing changes, sore throat Pulm: no dyspnea, coughing, wheezing CV: per hpi Abd: no abdominal pain, nausea/vomiting, chronic constipation GU: no dysuria, hematuria, polyuria   Objective:  Physical Exam: Filed Vitals:   11/08/12 1352  BP: 132/68  Pulse: 82  Temp: 98.5 F (36.9 C)  TempSrc: Oral  Weight: 212 lb 14.4 oz (96.571 kg)  SpO2: 93%   Constitutional: Vital signs reviewed.  Patient is a well-developed and well-nourished woman in mild acute distress and cooperative with exam.  Head: Normocephalic and atraumatic Eyes: PERRL, EOMI, conjunctivae normal, No scleral icterus.  Neck: Supple, Trachea midline normal ROM, No JVD, mass, thyromegaly, or carotid bruit present.  Cardiovascular: RRR, chest wall minimally tender to palpation; S1 normal, S2 normal, no MRG, pulses symmetric and intact bilaterally Pulmonary/Chest: CTAB, no wheezes, rales, or rhonchi Abdominal: Soft. Non-tender, non-distended, bowel sounds are normal, no masses, organomegaly, or guarding present.  Neurological: A&O x3 Skin: Warm, dry and intact. No rash, cyanosis, or clubbing.    Psychiatric: Depressed mood and affect. speech and behavior is normal. Judgment and thought content normal. Cognition and memory are normal.   Assessment & Plan:  Case and care discussed with Dr. Dalphine Handing. Patient to go to ED for further evaluation and will return to clinic for follow up for routines.

## 2012-11-08 NOTE — H&P (Signed)
[facility]mission [facility] Hospital Admission Note Date: 11/08/2012  Patient name: [name] Medical record number: [id number] Date of birth: 26-Sep-1951 Age: 62 y.o. Gender: female PCP: Stacy Gardner, MD  Medical Service: Internal Medicine Teaching Service  Attending physician:  Dr. Meredith Pel   1st Contact: Sabino Dick, MS4  Pager: [phone] 2nd Contact: Dr. Bosie Clos   Pager: [phone] After 5 pm or weekends: 1st Contact:      Pager: [phone] 2nd Contact:      Pager: [phone]  Chief Complaint: chest pain  History of Present Illness: [name] is a 62 year old female with PMH significant for HTN, HL, DM, CAD s/p catheterization in 2011 who presented to clinic earlier today with complaints of moderate, persistent, mid-sternal chest pain x 5 days and was then admitted to the ED.  Prior to that, she reports a similar pain over the previous few months, but reported that it waxed and waned unlike her current symptoms.  She describes the pain as a "tight, deep burning" and rates it at an 8/10.  She reports the pain is worse with any movement of her body (not necessarily exercise) and improved with her hydrocodone.  Yesterday, she reports trying her dexilant, gas pills and alkaseltzer with no symptomatic relief.  She reports this feeling is similar to the pain she had prior to catheterization. She denies any headache, nausea/vomiting, fevers, chills, SOB, or new lower extremity swelling or pain.    Additionally, she reports taking all of her scheduled medications this AM before coming to clinic  Meds: Current Outpatient Rx  Name  Route  Sig  Dispense  Refill  . AMLODIPINE BESYLATE 5 MG PO TABS   Oral   Take 1 tablet (5 mg total) by mouth daily.   30 tablet   5   . ASPIRIN 81 MG PO TABS   Oral   Take 81 mg by mouth daily.           . BUPROPION HCL ER (XL) 300 MG PO TB24   Oral   Take 300 mg by mouth daily.           Marland Kitchen CITALOPRAM HYDROBROMIDE 40 MG PO TABS   Oral  Take 40 mg by mouth daily.           . DEXLANSOPRAZOLE 60 MG PO CPDR   Oral   Take 60 mg by mouth daily.           Marland Kitchen FERROUS SULFATE 325 (65 FE) MG PO TABS   Oral   Take 1 tablet (325 mg total) by mouth daily.   30 tablet   3   . FLUTICASONE PROPIONATE 50 MCG/ACT NA SUSP   Nasal   Place 2 sprays into the nose daily.   16 g   2   . GABAPENTIN 800 MG PO TABS   Oral   Take 1 tablet (800 mg total) by mouth 3 (three) times daily. Take one tablet by mouth in am and 2 tablets by mouth in pm   90 tablet   11   . HYDROCODONE-ACETAMINOPHEN 10-325 MG PO TABS   Oral   Take 1 tablet by mouth every 6 (six) hours as needed for pain.   60 tablet   0   . INSULIN ASPART 100 UNIT/ML [place] SOLN   Subcutaneous   Inject 10 Units into the skin 3 (three) times daily before meals.   10 mL   3   . INSULIN GLARGINE 100 UNIT/ML  [place] SOLN   Subcutaneous   Inject 50 Units into the skin at bedtime.   3 pen   12   . ISOSORBIDE MONONITRATE ER 60 MG PO TB24   Oral   Take 1 tablet (60 mg total) by mouth daily.   30 tablet   6   . LOSARTAN POTASSIUM-HCTZ 100-25 MG PO TABS   Oral   Take 1 tablet by mouth daily.   30 tablet   5   . METOPROLOL SUCCINATE ER 25 MG PO TB24   Oral   Take 1 tablet (25 mg total) by mouth daily.   30 tablet   6   . SENNOSIDES 8.6 MG PO TABS   Oral   Take 4 tablets by mouth daily.           Marland Kitchen SIMVASTATIN 20 MG PO TABS   Oral   Take 1 tablet (20 mg total) by mouth at bedtime.   90 tablet   3   . ACCU-CHEK FASTCLIX LANCETS MISC   Does not apply   1 each by Does not apply route 3 (three) times daily before meals. Dx code 250.00   102 each   12   . GLUCOSE BLOOD VI STRP      Use to check blood sugar before meals dx code 250.00   100 each   12   . INSULIN PEN NEEDLE 31G X 5 MM MISC      Box of 100 - uses up to 5 times per day according to CBG   100 each   11     Allergies: Allergies as of 11/08/2012 - Review Complete 11/08/2012  Allergen  Reaction Noted  . Indomethacin Diarrhea 03/11/2011  . Penicillins Hives   . Cimetidine    . Sulfonamide derivatives     Past Medical History  Diagnosis Date  . Hypertension   . Peripheral neuropathy   . Cervicalgia   . Low back pain syndrome   . Hyperlipidemia   . Vitamin D deficiency   . Vitamin B12 deficiency   . Iliotibial band syndrome   . Anemia   . Borderline personality disorder   . Nonorganic psychosis   . GERD (gastroesophageal reflux disease)   . Diabetes mellitus   . Depression   . Diabetic gastroparesis   . Sleep apnea   . CAD (coronary artery disease)     Catherization 11/18/09>nonobstructive CAD , normal LV function, EF 65%  . Health maintenance examination     Colonoscopy 2009> normal; Diabetic eye exam 12/2008. No diabetic retinopathy; Mammogram 11/10> No evidence of malignancy   Past Surgical History  Procedure Date  . Abdominal hysterectomy   . Hand surgery    Family History  Problem Relation Age of Onset  . Diabetes Mother   . Hypertension Mother   . Hyperlipidemia Mother   . Arthritis Mother   . Heart disease Father   . Diabetes Sister   . Diabetes Brother    History   Social History  . Marital Status: Married    Spouse Name: N/A    Number of Children: N/A  . Years of Education: N/A   Occupational History  . Not on file.   Social History Main Topics  . Smoking status: Former Smoker    Types: Cigarettes    Quit date: 10/25/2002  . Smokeless tobacco: Not on file  . Alcohol Use: No  . Drug Use: No  . Sexually Active: Not on file   Other Topics  Concern  . Not on file   Social History Narrative  . No narrative on file    Review of Systems: Pertinent items are noted in HPI.  Physical Exam: Blood pressure 138/69, pulse 66, temperature 98.3 F (36.8 C), temperature source Oral, resp. rate 18, SpO2 97.00%. Vitals reviewed. General: resting in bed comfortably, NAD, appears stated age HEENT: PERRL, EOMI, no scleral icterus, TM  clear, MMM, oropharynx non-erythematous and without pustules Cardiac: RRR, no rubs, murmurs or gallops Pulm: clear to auscultation bilaterally, no wheezes, rales, or rhonchi Abd: soft, nontender, nondistended, BS present Ext: warm and well perfused, trace pedal edema Neuro: alert and oriented X3, cranial nerves II-XII grossly intact, strength and sensation to light touch equal in bilateral upper and lower extremities  Lab results: Basic Metabolic Panel:  Basename 11/08/12 1525  NA 142  K 4.5  CL 103  CO2 29  GLUCOSE 99  BUN 21  CREATININE 1.53*  CALCIUM 9.9  MG --  PHOS --   Liver Function Tests: No results found for this basename: AST:2,ALT:2,ALKPHOS:2,BILITOT:2,PROT:2,ALBUMIN:2 in the last 72 hours No results found for this basename: LIPASE:2,AMYLASE:2 in the last 72 hours No results found for this basename: AMMONIA:2 in the last 72 hours CBC:  Basename 11/08/12 1525  WBC 8.3  NEUTROABS --  HGB 11.7*  HCT 36.7  MCV 80.7  PLT 254   Cardiac Enzymes: Troponin i, poc 11/08/12 @ 1535 - 0.00 CBG:  Basename 11/08/12 1355  GLUCAP 113*   Hemoglobin A1C:  Basename 11/08/12 1411  HGBA1C 7.8    Imaging results:  Dg Chest 2 View  11/08/2012  *RADIOLOGY REPORT*  Clinical Data: Chest pain.  CHEST - 2 VIEW  Comparison: 10/13/2011 and earlier studies  Findings: Heart size upper limits normal.  Lungs clear.  No effusion.  Regional bones unremarkable.  IMPRESSION:  Borderline cardiomegaly.   Original Report Authenticated By: D. Andria Rhein, MD     Other results: EKG: normal EKG, normal sinus rhythm, unchanged from previous tracings.  Assessment & Plan by Problem:  Mrs. Smeal is a 62 year old female with PMH significant for HTN, HL, DM, and CAD who was admitted for chest pain r/o after presenting with chest pain x 5 days in [facility].  Chest pain: Mrs. Salaz is being admitted to rule out life-threatening causes of chest pain.  She was admitted in January 2011 with a similar  presentation and underwent a catheterization (revealed nonobstructive coronary disease with 60%) and myovue (no significant EKG changes) which were strongly suggestive of a non-cardiac origin.  It was noted then that her pain might be related to fibromyalgia or GI causes.  Differential includes cardiac causes, which is less likely given normal EKG and negative troponin x1, GI causes given pt's hx of GERD, pulmonary causes but nothing to suggest this in patient's history or exam, or other etiologies such as fibromyalgia.   Mrs. Cravey does has significant cardiac risk factors including her family hx of CAD, hx of smoking, HTN, DM and HL.   -EKG today shows NSR and no changes from her previous study.   -Cardiac biomarkers have been ordered.  Troponin negative x 1 thus far   -Echo ordered for further evaluation -cardiology consulted ordered, appreciate further recs -admitted to floor with telemetry -continue current outpatient regimen of statin, BB, ARB, ASA, Imdur -NTG prn  CAD s/p catheterization in 2011: previously seen by Dr. Eden Emms, has not seen him since 2011.  Catheterization on 11/18/09 showed nonobstructive coronary disease of  60%.   -monitor on telemetry -will set up follow-up with Dr. Eden Emms -continue home regimen  Diabetes Mellitus, Type 2: was previously fairly well-controlled with HbA1c of 6.9 (October 2013), now with HbA1c of 7.8 and CBG of 99 at time of admission.  Patient reports taking 48U Lantus qhs and 10U Novolog qAC -will continue home insulin regimen -will follow CBGs closely  Hypertension: stable -continue BB, ARB  -restart Amlodipine 5mg  qday as patient needs to fill her Rx  Hyperlipidemia: stable, patients takes simvastatin 20mg  qday -will continue home statin  Coronary Artery Aneurysm: in previous note by cardiology, noted to be moderate by cath in 2011 -continue medical management with Imdur  GERD: controlled with dexilant 60 at home -GI cocktail (Maalox,  lidocaine, donnatal) for symptomatic relief -give pantoprazole while inpatient  Depression and Anxiety: well-controlled -continue home Wellbutrin XL 300mg  qday and Celexa 40mg  qday  Peripheral Neuropathy: controlled -continue home gabapentin   Dispo: Disposition is deferred at this time, awaiting improvement of current medical problems. Anticipated discharge in approximately 1 day.   The patient does have a current PCP (SHARDA, NEEMA, MD), therefore will be requiring OPC follow-up after discharge.   The patient does not have transportation limitations that hinder transportation to clinic appointments.  Signed: Gardner Candle 11/08/2012, 5:34 PM  This is a Psychologist, occupational Note.  The care of the patient was discussed with Dr. Bosie Clos and the assessment and plan was formulated with their assistance.  Please see their note for official documentation of the patient encounter.

## 2012-11-08 NOTE — ED Provider Notes (Signed)
History     CSN: [id number]  Arrival date & time 11/08/12  1508   First MD Initiated Contact with Patient 11/08/12 1601      Chief Complaint  Patient presents with  . Chest Pain    (Consider location/radiation/quality/duration/timing/severity/associated sxs/prior treatment) HPI Pt with history of DM and non-obstructive CAD from cath in 2011 reports several days of intermittent moderate to severe midsternal chest pain, not associated with fever, cough, SOB, nausea or vomiting. Pain is non-radiating. No particular provoking or relieving factors. Pt was seen in PCP office today and sent to the ED for evaluation.   Past Medical History  Diagnosis Date  . Hypertension   . Peripheral neuropathy   . Cervicalgia   . Low back pain syndrome   . Hyperlipidemia   . Vitamin D deficiency   . Vitamin B12 deficiency   . Iliotibial band syndrome   . Anemia   . Borderline personality disorder   . Nonorganic psychosis   . GERD (gastroesophageal reflux disease)   . Diabetes mellitus   . Depression   . Diabetic gastroparesis   . Sleep apnea   . CAD (coronary artery disease)     Catherization 11/18/09>nonobstructive CAD , normal LV function, EF 65%  . Health maintenance examination     Colonoscopy 2009> normal; Diabetic eye exam 12/2008. No diabetic retinopathy; Mammogram 11/10> No evidence of malignancy    Past Surgical History  Procedure Date  . Abdominal hysterectomy   . Hand surgery     Family History  Problem Relation Age of Onset  . Diabetes Mother   . Hypertension Mother   . Hyperlipidemia Mother   . Arthritis Mother   . Heart disease Father   . Diabetes Sister   . Diabetes Brother     History  Substance Use Topics  . Smoking status: Former Smoker    Types: Cigarettes    Quit date: 10/25/2002  . Smokeless tobacco: Not on file  . Alcohol Use: No    OB History    Grav Para Term Preterm Abortions TAB SAB Ect Mult Living                  Review of Systems All  other systems reviewed and are negative except as noted in HPI.   Allergies  Indomethacin; Penicillins; Cimetidine; and Sulfonamide derivatives  Home Medications   Current Outpatient Rx  Name  Route  Sig  Dispense  Refill  . AMLODIPINE BESYLATE 5 MG PO TABS   Oral   Take 1 tablet (5 mg total) by mouth daily.   30 tablet   5   . ASPIRIN 81 MG PO TABS   Oral   Take 81 mg by mouth daily.           . BUPROPION HCL ER (XL) 300 MG PO TB24   Oral   Take 300 mg by mouth daily.           Marland Kitchen CITALOPRAM HYDROBROMIDE 40 MG PO TABS   Oral   Take 40 mg by mouth daily.           . DEXLANSOPRAZOLE 60 MG PO CPDR   Oral   Take 60 mg by mouth daily.           Marland Kitchen FERROUS SULFATE 325 (65 FE) MG PO TABS   Oral   Take 1 tablet (325 mg total) by mouth daily.   30 tablet   3   . FLUTICASONE PROPIONATE 50 MCG/ACT  NA SUSP   Nasal   Place 2 sprays into the nose daily.   16 g   2   . GABAPENTIN 800 MG PO TABS   Oral   Take 1 tablet (800 mg total) by mouth 3 (three) times daily. Take one tablet by mouth in am and 2 tablets by mouth in pm   90 tablet   11   . HYDROCODONE-ACETAMINOPHEN 10-325 MG PO TABS   Oral   Take 1 tablet by mouth every 6 (six) hours as needed for pain.   60 tablet   0   . INSULIN ASPART 100 UNIT/ML [place] SOLN   Subcutaneous   Inject 10 Units into the skin 3 (three) times daily before meals.   10 mL   3   . INSULIN GLARGINE 100 UNIT/ML [place] SOLN   Subcutaneous   Inject 50 Units into the skin at bedtime.   3 pen   12   . ISOSORBIDE MONONITRATE ER 60 MG PO TB24   Oral   Take 1 tablet (60 mg total) by mouth daily.   30 tablet   6   . LOSARTAN POTASSIUM-HCTZ 100-25 MG PO TABS   Oral   Take 1 tablet by mouth daily.   30 tablet   5   . METOPROLOL SUCCINATE ER 25 MG PO TB24   Oral   Take 1 tablet (25 mg total) by mouth daily.   30 tablet   6   . SENNOSIDES 8.6 MG PO TABS   Oral   Take 4 tablets by mouth daily.           Marland Kitchen SIMVASTATIN 20 MG PO  TABS   Oral   Take 1 tablet (20 mg total) by mouth at bedtime.   90 tablet   3   . ACCU-CHEK FASTCLIX LANCETS MISC   Does not apply   1 each by Does not apply route 3 (three) times daily before meals. Dx code 250.00   102 each   12   . GLUCOSE BLOOD VI STRP      Use to check blood sugar before meals dx code 250.00   100 each   12   . INSULIN PEN NEEDLE 31G X 5 MM MISC      Box of 100 - uses up to 5 times per day according to CBG   100 each   11     BP 156/72  Pulse 65  Temp 98.3 F (36.8 C) (Oral)  Resp 18  SpO2 97%  Physical Exam  Nursing note and vitals reviewed. Constitutional: She is oriented to person, place, and time. She appears well-developed and well-nourished.  HENT:  Head: Normocephalic and atraumatic.  Eyes: EOM are normal. Pupils are equal, round, and reactive to light.  Neck: Normal range of motion. Neck supple.  Cardiovascular: Normal rate, normal heart sounds and intact distal pulses.   Pulmonary/Chest: Effort normal and breath sounds normal.  Abdominal: Bowel sounds are normal. She exhibits no distension. There is no tenderness.  Musculoskeletal: Normal range of motion. She exhibits no edema and no tenderness.  Neurological: She is alert and oriented to person, place, and time. She has normal strength. No cranial nerve deficit or sensory deficit.  Skin: Skin is warm and dry. No rash noted.  Psychiatric: She has a normal mood and affect.    ED Course  Procedures (including critical care time)  Labs Reviewed  CBC - Abnormal; Notable for the following:    Hemoglobin  11.7 (*)     MCH 25.7 (*)     All other components within normal limits  BASIC METABOLIC PANEL - Abnormal; Notable for the following:    Creatinine, Ser 1.53 (*)     GFR calc non Af Amer 35 (*)     GFR calc Af Amer 41 (*)     All other components within normal limits  POCT I-STAT TROPONIN I   No results found.   No diagnosis found.    MDM   Date: 11/08/2012  Rate: 67   Rhythm: normal sinus rhythm  QRS Axis: normal  Intervals: normal  ST/T Wave abnormalities: normal  Conduction Disutrbances: none  Narrative Interpretation: unremarkable  Labs unremarkable. Discussed with [facility] resident who will come to the ED to evaluate for admission.           Breiona Couvillon B. Bernette Mayers, MD 11/08/12 [phone]

## 2012-11-08 NOTE — H&P (Signed)
Internal Medicine Teaching Service Resident Admission Note Date: 11/08/2012  Patient name: [name] Medical record number: [id number] Date of birth: 24-Sep-1951 Age: 62 y.o. Gender: female PCP: Stacy Gardner, MD  Medical Service: Internal Medicine Teaching Service - [place]  I have reviewed the note by Sabino Dick MS 4 and was present during the interview and physical exam.  Please see my separate note for findings, assessment, and plan.    SignedKristie Cowman 11/08/2012, 7:22 PM

## 2012-11-08 NOTE — Consult Note (Signed)
CARDIOLOGY CONSULT NOTE    Patient ID: [name] MRN: [id number] DOB/AGE: [birth year] 62 y.o.  Admit date: 11/08/2012 Referring Physician Otelia Sergeant MD Primary Physician Otelia Sergeant MD Primary Cardiologist Charlton Haws MD Reason for Consultation chest pain  HPI:  62 yo BF with history of nonobstructive CAD, DM, HTN, and hyperlipidemia presents to the ED today for evaluation of chest pain. She states she has had chest pain for 5 days. It is described as a pressure like pain in the mid lower sternum. Occasionally there is a sharp shooting quality. It is worse with activity such as doing house chores. No radiation of pain. No N/V or SOB. Pain is similar to pain in 2011 when she had a cardiac cath. She also reports a stress test following her cath but I cannot find these results. She states this is different than her GERD symptoms which usually involve acid reflux into her throat. No recent injury. She reports good control of DM on insulin. She remains active.  Review of systems complete and found to be negative unless listed above   Past Medical History  Diagnosis Date  . Hypertension   . Peripheral neuropathy   . Cervicalgia   . Low back pain syndrome   . Hyperlipidemia   . Vitamin D deficiency   . Vitamin B12 deficiency   . Iliotibial band syndrome   . Anemia   . Borderline personality disorder   . Nonorganic psychosis   . GERD (gastroesophageal reflux disease)   . Diabetes mellitus   . Depression   . Diabetic gastroparesis   . Sleep apnea   . CAD (coronary artery disease)     Catherization 11/18/09>nonobstructive CAD , normal LV function, EF 65%  . Health maintenance examination     Colonoscopy 2009> normal; Diabetic eye exam 12/2008. No diabetic retinopathy; Mammogram 11/10> No evidence of malignancy    Family History  Problem Relation Age of Onset  . Diabetes Mother   . Hypertension Mother   . Hyperlipidemia Mother   . Arthritis Mother   . Heart disease Father   .  Diabetes Sister   . Diabetes Brother     History   Social History  . Marital Status: Married    Spouse Name: N/A    Number of Children: N/A  . Years of Education: N/A   Occupational History  . Not on file.   Social History Main Topics  . Smoking status: Former Smoker    Types: Cigarettes    Quit date: 10/25/2002  . Smokeless tobacco: Not on file  . Alcohol Use: No  . Drug Use: No  . Sexually Active: Not on file   Other Topics Concern  . Not on file   Social History Narrative  . No narrative on file    Past Surgical History  Procedure Date  . Abdominal hysterectomy   . Hand surgery     Physical Exam: Blood pressure 138/69, pulse 66, temperature 98.3 F (36.8 C), temperature source Oral, resp. rate 18, SpO2 97.00%.   Pleasant BF in NAD. The patient is alert and oriented x 3.  The mood and affect are normal.  The skin is warm and dry.  Color is normal.  The HEENT exam reveals that the sclera are nonicteric.  The mucous membranes are moist.  The carotids are 2+ without bruits.  There is no thyromegaly.  There is no JVD.  The lungs are clear.  The chest wall is tender to palpation in the  mid sternal area and this reproduces her pain.  The heart exam reveals a regular rate with a normal S1 and S2.  There are no murmurs, gallops, or rubs.  The PMI is not displaced.   Abdominal exam reveals good bowel sounds.  There is no guarding or rebound.  There is no hepatosplenomegaly or tenderness.  There are no masses.  Exam of the legs reveal no clubbing, cyanosis, or edema.  The legs are without rashes.  The distal pulses are intact.  Cranial nerves II - XII are intact.  Motor and sensory functions are intact.  The gait is normal.  Labs:   Lab Results  Component Value Date   WBC 8.3 11/08/2012   HGB 11.7* 11/08/2012   HCT 36.7 11/08/2012   MCV 80.7 11/08/2012   PLT 254 11/08/2012    Lab 11/08/12 1525  NA 142  K 4.5  CL 103  CO2 29  BUN 21  CREATININE 1.53*  CALCIUM 9.9  PROT --    BILITOT --  ALKPHOS --  ALT --  AST --  GLUCOSE 99   Lab Results  Component Value Date   CKTOTAL 99 11/23/2009   CKMB 1.3 11/23/2009   TROPONINI  Value: 0.03        NO INDICATION OF MYOCARDIAL INJURY. 11/23/2009    Lab Results  Component Value Date   CHOL 150 12/31/2011   CHOL 86 12/04/2010   CHOL  Value: 129        ATP III CLASSIFICATION:  <200     mg/dL   Desirable  960-454  mg/dL   Borderline High  >=098    mg/dL   High        11/12/1476   Lab Results  Component Value Date   HDL 31* 12/31/2011   HDL 23* 12/04/2010   HDL 34* 11/18/2009   Lab Results  Component Value Date   LDLCALC Comment:   Not calculated due to Triglyceride >400. Suggest ordering Direct LDL (Unit Code: 29562).   Total Cholesterol/HDL Ratio:CHD Risk                        Coronary Heart Disease Risk Table                                        Men       Women          1/2 Average Risk              3.4        3.3              Average Risk              5.0        4.4           2X Average Risk              9.6        7.1           3X Average Risk             23.4       11.0 Use the calculated Patient Ratio above and the CHD Risk table  to determine the patient's CHD Risk. ATP III Classification (LDL):       < 100  mg/dL         Optimal      161 - 129     mg/dL         Near or Above Optimal      130 - 159     mg/dL         Borderline High      160 - 189     mg/dL         High       > 096        mg/dL         Very High   0/01/5408   LDLCALC 40 12/04/2010   LDLCALC  Value: 71        Total Cholesterol/HDL:CHD Risk Coronary Heart Disease Risk Table                     Men   Women  1/2 Average Risk   3.4   3.3  Average Risk       5.0   4.4  2 X Average Risk   9.6   7.1  3 X Average Risk  23.4   11.0        Use the calculated Patient Ratio above and the CHD Risk Table to determine the patient's CHD Risk.        ATP III CLASSIFICATION (LDL):  <100     mg/dL   Optimal  811-914  mg/dL   Near or Above                    Optimal  130-159   mg/dL   Borderline  782-956  mg/dL   High  >213     mg/dL   Very High 0/86/5784   Lab Results  Component Value Date   TRIG 475* 12/31/2011   TRIG 113 12/04/2010   TRIG 122 11/18/2009   Lab Results  Component Value Date   CHOLHDL 4.8 12/31/2011   CHOLHDL 3.7 12/04/2010   CHOLHDL 3.8 11/18/2009   Lab Results  Component Value Date   LDLDIRECT 60 06/16/2012      Radiology:Clinical Data: Chest pain.  CHEST - 2 VIEW  Comparison: 10/13/2011 and earlier studies  Findings: Heart size upper limits normal. Lungs clear. No  effusion. Regional bones unremarkable.  IMPRESSION:  Borderline cardiomegaly.  Original Report Authenticated By: D. Andria Rhein, MD  EKG:08-Nov-2012 15:10:42 [facility]/ED ROUTINE RECORD Normal sinus rhythm Normal ECG Transthoracic Echocardiography  Patient: Beena, Catano MR #: [id number] Study Date: 12/30/2009 Gender: F Age: 58 Height: 165.1cm Weight: 87.7kg BSA: 2.84m^2 Pt. Status: Room:  PERFORMING [facility] [place], [place] SONOGRAPHER Teodoro Spray cc:  -------------------------------------------------------------------- Indications: Chest pain 786.51.  -------------------------------------------------------------------- Study Conclusions  - Left ventricle: The cavity size was mildly dilated. Wall thickness was increased in a pattern of moderate LVH. Systolic function was normal. The estimated ejection fraction was in the range of 55% to 60%. - Aortic valve: Trivial regurgitation. - Mitral valve: Mild regurgitation. - Left atrium: The atrium was mildly dilated. - Atrial septum: No defect or patent foramen ovale was identified. - Pulmonary arteries: PA peak pressure: 35mm Hg (S). Transthoracic echocardiography. M-mode, complete 2D, spectral Doppler, and color Doppler. Height: Height: 165.1cm. Height: 65in. Weight: Weight: 87.7kg. Weight: 193lb. Body mass index: BMI: 32.2kg/m^2. Body surface area: BSA: 2.49m^2. Blood  pressure: 135/80. Patient status: Inpatient. Location: Echo laboratory.  --------------------------------------------------------------------  -------------------------------------------------------------------- Left ventricle: The cavity size was mildly dilated. Wall  thickness was increased in a pattern of moderate LVH. Systolic function was normal. The estimated ejection fraction was in the range of 55% to 60%.  -------------------------------------------------------------------- Aortic valve: Doppler: Trivial regurgitation.  -------------------------------------------------------------------- Aorta: The aorta was normal, not dilated, and non-diseased.  -------------------------------------------------------------------- Mitral valve: Doppler: Mild regurgitation. Peak gradient: 3mm Hg (D).  -------------------------------------------------------------------- Left atrium: The atrium was mildly dilated.  -------------------------------------------------------------------- Atrial septum: No defect or patent foramen ovale was identified.  -------------------------------------------------------------------- Right ventricle: The cavity size was normal. Wall thickness was normal. Systolic function was normal.  -------------------------------------------------------------------- Tricuspid valve: Doppler: Mild regurgitation. -------------------------------------------------------------------- Right atrium: The atrium was normal in size.  -------------------------------------------------------------------- Pericardium: The pericardium was normal in appearance.  -------------------------------------------------------------------- Post procedure conclusions Ascending Aorta:  - The aorta was normal, not dilated, and non-diseased.  --------------------------------------------------------------------  2D measurements Normal Doppler measurements Normal Left ventricle Main pulmonary  artery LVID ED, chord, 38.48 mm 43-52 Pressure, S 35 mm Hg =30 PLAX Left ventricle LVID ES, chord, 23.9 mm 23-38 Ea, lat ann, 6 cm/s ------- PLAX tiss DP FS, chord, PLAX 38 % >29 E/Ea, lat 15.28 ------- LVPW, ED 13.73 mm ------ ann, tiss DP IVS/LVPW ratio, 1.02 <1.3 Ea, med ann, 9 cm/s ------- ED tiss DP Ventricular septum E/Ea, med 10.18 ------- IVS, ED 13.98 mm ------ ann, tiss DP Aorta Mitral valve Root diam, ED 27.05 mm ------ Peak E vel 91.66 cm/s ------- Peak A vel 100.98 cm/s ------- M-mode measurements Normal Deceleration 242 ms 150-230 Left ventricle time EF, Teichholz 68.74 % 64-83 Peak 3 mm Hg ------- gradient, D Peak E/A 0.91 ------- ratio Tricuspid valve Regurg peak 250.15 cm/s ------- vel Peak RV-RA 25 mm Hg ------- gradient, S Max regurg 250.15 cm/s ------- vel Systemic veins Estimated 10 mm Hg ------- CVP Right ventricle Pressure, S 35 mm Hg <30  -------------------------------------------------------------------- Prepared and Electronically Authenticated by  Charlton Haws, MD, [facility] 2011-03-08T14:05:04.710     PHYSICIAN:  Rollene Rotunda, MD, FACCDATE OF BIRTH:  09-Feb-1951  DATE OF PROCEDURE:  11/18/2009 DATE OF DISCHARGE:                           CARDIAC CATHETERIZATION  PRIMARY CARE PHYSICIAN:  Edsel Petrin, DO  CARDIOLOGIST:  Noralyn Pick. Eden Emms, MD, [facility]  PROCEDURE:  Left heart catheterization/coronary arteriography.  INDICATIONS:  Evaluate the patient with chest pain (786.5).  PROCEDURE NOTE:  Left heart catheterization was performed via the right femoral artery.  The area was cannulated using the anterior wall puncture.  A #5-French arterial sheath was inserted via the modified Seldinger technique.  Preformed Judkins and a pigtail catheter were utilized.  The patient tolerated the procedure well and left the lab in stable condition.  RESULTS:  Hemodynamics:  LV 158/80, AO 154/77.  Coronaries:  Left main had distal 25% stenosis.   The the LAD had long proximal to mid 25% stenosis.  First diagonal was moderate sized with ostial 25% stenosis. Second diagonal was moderate sized with ostial 60% stenosis.  The circumflex had moderate calcification.  There was a long mid 25% stenosis.  There was a long 40-50% stenosis after the mid obtuse marginal.  Mid obtuse marginal was a large vessel with proximal 25% stenosis.  The posterolateral was large with mid 25% stenosis.  The right coronary artery was a large dominant vessel.  There was long proximal to mid 50% stenosis.  There was distal 60-70% stenosis.  PDA was moderate sized and normal.  Posterolateral was small  and normal. Left ventriculogram was obtained in the RAO projection.  The EF was 65% with normal wall motion.  CONCLUSION:  Nonobstructive coronary artery disease.  PLAN:  The patient will have medical management.  I will reviewed the films with Dr. Eden Emms.  If she has continued discomfort, we could consider further imaging such as IVUS or FloWire to the distal RCA versus a functional study.  At this point, there has been no objective evidence of ischemia.     Rollene Rotunda, MD, [facility]    ASSESSMENT AND PLAN: 1. Atypical chest pain. Normal Ecg. Initial troponins negative. Exam is most consistent with musculoskeletal pain that is reproduced. Patient does have a history of moderate nonobstructive CAD by cath in 2011. Would recommend cycling cardiac enzymes. Treat pain with analgesics/ NSAIDS. If enzymes negative I would consider an outpatient myoview study. We will follow up in am.  2. IDDM  3. HTN  4. Hyperlipidemia  5. GERD  6. CKD stage 3.    Signed: Thedora Hinders, [facility] 11/08/2012, 6:18 PM

## 2012-11-08 NOTE — Assessment & Plan Note (Signed)
Patient did complain of this again today prior to referral to ED; will need to refer to neuro for nerve testing vs hand surgery for carpel tunnel release at next follow up

## 2012-11-09 ENCOUNTER — Telehealth: Payer: Self-pay | Admitting: *Deleted

## 2012-11-09 ENCOUNTER — Other Ambulatory Visit: Payer: Self-pay | Admitting: *Deleted

## 2012-11-09 DIAGNOSIS — R079 Chest pain, unspecified: Secondary | ICD-10-CM

## 2012-11-09 LAB — HEPATIC FUNCTION PANEL
AST: 19 U/L (ref 0–37)
Albumin: 3.4 g/dL — ABNORMAL LOW (ref 3.5–5.2)
Alkaline Phosphatase: 68 U/L (ref 39–117)
Total Bilirubin: 0.2 mg/dL — ABNORMAL LOW (ref 0.3–1.2)

## 2012-11-09 LAB — BASIC METABOLIC PANEL
BUN: 22 mg/dL (ref 6–23)
CO2: 28 mEq/L (ref 19–32)
Chloride: 102 mEq/L (ref 96–112)
Glucose, Bld: 81 mg/dL (ref 70–99)
Potassium: 4.2 mEq/L (ref 3.5–5.1)

## 2012-11-09 LAB — CBC
HCT: 35.7 % — ABNORMAL LOW (ref 36.0–46.0)
Hemoglobin: 11.3 g/dL — ABNORMAL LOW (ref 12.0–15.0)
MCH: 25.7 pg — ABNORMAL LOW (ref 26.0–34.0)
MCHC: 31.7 g/dL (ref 30.0–36.0)

## 2012-11-09 LAB — GLUCOSE, CAPILLARY: Glucose-Capillary: 130 mg/dL — ABNORMAL HIGH (ref 70–99)

## 2012-11-09 MED ORDER — OXYCODONE HCL 5 MG PO TABS
5.0000 mg | ORAL_TABLET | Freq: Once | ORAL | Status: AC
  Administered 2012-11-09: 5 mg via ORAL
  Filled 2012-11-09: qty 1

## 2012-11-09 MED ORDER — PANTOPRAZOLE SODIUM 40 MG PO TBEC
40.0000 mg | DELAYED_RELEASE_TABLET | Freq: Two times a day (BID) | ORAL | Status: DC
  Administered 2012-11-09: 40 mg via ORAL
  Filled 2012-11-09: qty 1

## 2012-11-09 NOTE — Progress Notes (Signed)
Order placed for follow up testing as per Dr Fabio Bering order.  Send to [facility] to schedule appt.

## 2012-11-09 NOTE — Progress Notes (Signed)
UR Completed Jsean Taussig Graves-Bigelow, RN,BSN [phone]  

## 2012-11-09 NOTE — Telephone Encounter (Signed)
Pt to be scheduled for lexiscan per Dr Fabio Bering order (p/host) for chest pain

## 2012-11-09 NOTE — Progress Notes (Signed)
Patient ID: [name], female   DOB: 01-12-1951, 62 y.o.   MRN: [id number]  Primary Cardiologist : Hochrein  Subjective:  Atypical pain improved  Objective:  Filed Vitals:   11/08/12 1930 11/08/12 2015 11/08/12 2200 11/09/12 0557  BP: 157/81 173/82 150/79 134/57  Pulse: 73 66  62  Temp:  98.2 F (36.8 C)  98.3 F (36.8 C)  TempSrc:    Oral  Resp:  16    Height:  5\' 4"  (1.626 m)    Weight:  212 lb (96.163 kg)  212 lb (96.163 kg)  SpO2: 95% 95%  97%    Intake/Output from previous day: No intake or output data in the 24 hours ending 11/09/12 0818  Physical Exam: Affect appropriate Obese black female HEENT: normal Neck supple with no adenopathy JVP normal no bruits no thyromegaly Lungs clear with no wheezing and good diaphragmatic motion Heart:  S1/S2 no murmur, no rub, gallop or click PMI normal Abdomen: benighn, BS positve, no tenderness, no AAA no bruit.  No HSM or HJR Distal pulses intact with no bruits No edema Neuro non-focal Skin warm and dry No muscular weakness   Lab Results: Basic Metabolic Panel:  Basename 11/09/12 0442 11/08/12 1525  NA 140 142  K 4.2 4.5  CL 102 103  CO2 28 29  GLUCOSE 81 99  BUN 22 21  CREATININE 1.75* 1.53*  CALCIUM 9.5 9.9  MG -- --  PHOS -- --   CBC:  Basename 11/09/12 0442 11/08/12 1525  WBC 8.7 8.3  NEUTROABS -- --  HGB 11.3* 11.7*  HCT 35.7* 36.7  MCV 81.3 80.7  PLT 228 254   Cardiac Enzymes:  Basename 11/09/12 0420 11/08/12 2156  CKTOTAL -- --  CKMB -- --  CKMBINDEX -- --  TROPONINI <0.30 <0.30   Hemoglobin A1C:  Basename 11/08/12 1411  HGBA1C 7.8    Imaging: Dg Chest 2 View  11/08/2012  *RADIOLOGY REPORT*  Clinical Data: Chest pain.  CHEST - 2 VIEW  Comparison: 10/13/2011 and earlier studies  Findings: Heart size upper limits normal.  Lungs clear.  No effusion.  Regional bones unremarkable.  IMPRESSION:  Borderline cardiomegaly.   Original Report Authenticated By: D. Andria Rhein, MD      Cardiac Studies:  ECG: NSR rate 67 normal   Telemetry: NSR no arrhythmia 11/09/2012   Echo: 12/2009 moderate LVH EF 60% mild MR  Medications:     . amLODipine  5 mg Oral Daily  . aspirin EC  81 mg Oral Daily  . buPROPion  300 mg Oral Daily  . citalopram  40 mg Oral Daily  . docusate sodium  100 mg Oral BID  . enoxaparin (LOVENOX) injection  40 mg Subcutaneous Q24H  . ferrous sulfate  325 mg Oral Daily  . fluticasone  2 spray Each Nare Daily  . gabapentin  800 mg Oral TID  . hydrochlorothiazide  25 mg Oral Daily  . insulin aspart  10 Units Subcutaneous TID AC  . insulin glargine  50 Units Subcutaneous QHS  . isosorbide mononitrate  60 mg Oral Daily  . losartan  100 mg Oral Daily  . metoprolol succinate  25 mg Oral Daily  . pantoprazole  40 mg Oral Daily  . senna  1 tablet Oral BID  . simvastatin  20 mg Oral QHS  . sodium chloride  3 mL Intravenous Q12H       Assessment/Plan:  Chest Pain: R/O normal ECG history of moderate nonobstructive disease by  cath 2011 by Dr Antoine Poche  D/C home With outpatient myovue next week HTN:  Continue losaran and calcium blocker Chol:  Continue statin  Will arrange f/u with Dr Antoine Poche and outpatient myovue.  She is under 235lbs and can have one day study  Charlton Haws 11/09/2012, 8:18 AM

## 2012-11-09 NOTE — Progress Notes (Signed)
Medical Student Daily Progress Note  Subjective: Patient reports no acute events overnight, but says that she slept poorly because of the chest pain.  She also reports this morning that the chest pain has improved since being admitted.  She denies any SOB, headache or nausea/vomiting.  Objective: Vital signs in last 24 hours: Filed Vitals:   11/08/12 1930 11/08/12 2015 11/08/12 2200 11/09/12 0557  BP: 157/81 173/82 150/79 134/57  Pulse: 73 66  62  Temp:  98.2 F (36.8 C)  98.3 F (36.8 C)  TempSrc:    Oral  Resp:  16    Height:  5\' 4"  (1.626 m)    Weight:  96.163 kg (212 lb)  96.163 kg (212 lb)  SpO2: 95% 95%  97%   Physical Exam: Vitals reviewed. General: resting in bed, NAD HEENT: EOMI, no scleral icterus Cardiac: RRR, no rubs, murmurs or gallops Pulm: clear to auscultation bilaterally, no wheezes, rales, or rhonchi Abd: soft, nontender, nondistended, BS present Ext: warm and well perfused, no pedal edema Neuro: alert and oriented X3, cranial nerves II-XII grossly intact, strength and sensation to light touch equal in bilateral upper and lower extremities  Lab Results: Basic Metabolic Panel:  Lab 11/09/12 1610 11/08/12 1525  NA 140 142  K 4.2 4.5  CL 102 103  CO2 28 29  GLUCOSE 81 99  BUN 22 21  CREATININE 1.75* 1.53*  CALCIUM 9.5 9.9  MG -- --  PHOS -- --   CBC:  Lab 11/09/12 0442 11/08/12 1525  WBC 8.7 8.3  NEUTROABS -- --  HGB 11.3* 11.7*  HCT 35.7* 36.7  MCV 81.3 80.7  PLT 228 254   Cardiac Enzymes:  Lab 11/09/12 0420 11/08/12 2156  CKTOTAL -- --  CKMB -- --  CKMBINDEX -- --  TROPONINI <0.30 <0.30   CBG:  Lab 11/09/12 0715 11/08/12 2016 11/08/12 1355  GLUCAP 84 75 113*   Hemoglobin A1C:  Lab 11/08/12 1411  HGBA1C 7.8    Micro Results: No results found for this or any previous visit (from the past 240 hour(s)). Studies/Results: Dg Chest 2 View  11/08/2012  *RADIOLOGY REPORT*  Clinical Data: Chest pain.  CHEST - 2 VIEW  Comparison:  10/13/2011 and earlier studies  Findings: Heart size upper limits normal.  Lungs clear.  No effusion.  Regional bones unremarkable.  IMPRESSION:  Borderline cardiomegaly.   Original Report Authenticated By: D. Andria Rhein, MD    Medications: I have reviewed the patient's current medications. Scheduled Meds:   . amLODipine  5 mg Oral Daily  . aspirin EC  81 mg Oral Daily  . buPROPion  300 mg Oral Daily  . citalopram  40 mg Oral Daily  . docusate sodium  100 mg Oral BID  . enoxaparin (LOVENOX) injection  40 mg Subcutaneous Q24H  . ferrous sulfate  325 mg Oral Daily  . fluticasone  2 spray Each Nare Daily  . gabapentin  800 mg Oral TID  . hydrochlorothiazide  25 mg Oral Daily  . insulin aspart  10 Units Subcutaneous TID AC  . insulin glargine  50 Units Subcutaneous QHS  . isosorbide mononitrate  60 mg Oral Daily  . losartan  100 mg Oral Daily  . metoprolol succinate  25 mg Oral Daily  . pantoprazole  40 mg Oral BID AC  . senna  1 tablet Oral BID  . simvastatin  20 mg Oral QHS  . sodium chloride  3 mL Intravenous Q12H   Continuous Infusions:  PRN Meds:.acetaminophen, acetaminophen, HYDROcodone-acetaminophen, nitroGLYCERIN, ondansetron (ZOFRAN) IV, ondansetron, zolpidem  Assessment/Plan:  Mrs. Rusk is a 62 yo female with PMH of HTN, HL, DM who presented to clinic with 5 days of persist, midsternal burning with a normal CXR, EKG and negative troponin x 3, thought to be 2/2 MSK causes  Chest Pain: normal CXR and EKG in patient with moderate non-obstructive CAD s/p catheterization in 2011.   -cardiac enzymes cycled and were negative x3 -cardiology consulted, recommended d/c home with outpatient myovue next week; cardiology plans to contact patient and arrange follow up with Dr. 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  Takes Lantus 50U qhs and Novolog 10U qAC.  Takes gabapentin 800mg  TID for neuropathy. -hold morning Novolog in setting of low CBG -continue home regimen of insulin and gabapentin -continue PPI and stool softners  GERD and gastroparesis: takes Dexilant 60mg  qday, followed by [facility] -continue PPI  HL: stable -continue statin  HTN: on BB, CCB, nitrate, ACEi, thiazide -continue Toprol XL 25mg  qday, Amlodipine 5mg  qday, Imdur 60mg  qday, Losartan-HCT 100-25mg  qday  Anxiety and Depression: stable -continue celexa 40mg  qday -cont Wellbutrin XL 300mg  qday    LOS: 1 day   This is a Psychologist, occupational Note.  The care of the patient was discussed with Dr. Bosie Clos and the assessment and plan formulated with their assistance.  Please see their attached note for official documentation of the daily encounter.  Sabino Dick T 11/09/2012, 9:06 AM   Dispo: Anticipated discharge home today.   The patient does have a current PCP (SHARDA, NEEMA, MD), therefore will be requiring OPC follow-up after discharge.   The patient does not have transportation limitations that hinder transportation to clinic appointments.  .Services Needed at time of discharge: Y = Yes, Blank = No PT:   OT:   RN:   Equipment:   Other:

## 2012-11-09 NOTE — Progress Notes (Signed)
Resident Addendum to Medical Student Note   I have seen and examined the patient, and agree with the the medical student assessment and plan outlined above. Please see my brief note below for additional details.  S: Reports resolution of chest discomfort, no acute events on telemetry   OBJECTIVE: VS: Reviewed  Meds: Reviewed  Labs: Reviewed  Imaging: Reviewed   Physical Exam: General: Vital signs reviewed and noted. Well-developed, well-nourished, in no acute distress; alert, appropriate and cooperative throughout examination, husband at bedside  HEENT: Normocephalic, atraumatic  Lungs:  Normal respiratory effort.   Heart: NSR on telemetry, No JVD appreciated  Extremities: Trace LE edema bilaterally     ASSESSMENT/ PLAN:    Chest Pain: no cardiac source identified, normal EKG, no elevated cardiac enzymes, pt does have notable CAD 60% RCA on prior cardiac cath.  Appreciate Cardiology recommendations. On PPI for GERD, followed by Dr. Matthias Hughs of Gastroenterology for gastroparesis. -cont current medical regimen on ACEi, BB, statin, nitrate, thiazide  -f/u with Dr. Antoine Poche of Cardiology with plans for Myoview as outpt -f/u with Dr. Matthias Hughs for re-evaluatiion of GERD +/- gastroparesis -plan d/c home today of prior outpt medical regimen   Length of Stay: 1   [name]  11/09/2012, 12:26 PM

## 2012-11-09 NOTE — H&P (Signed)
Internal Medicine Attending Admission Note Date: 11/09/2012  Patient name: [name] Medical record number: [id number] Date of birth: 04/12/1951 Age: 62 y.o. Gender: female  I saw and evaluated the patient. I reviewed the resident's note and I agree with the resident's findings and plan as documented in the resident's note, with the following additional comments.  Chief Complaint(s): Chest pain  History - key components related to admission: Patient is a 62 year old woman with a history of nonobstructive coronary artery disease, diabetes mellitus, hypertension, hyperlipidemia, GERD, and coronary artery aneurysm admitted with a five-day history of substernal burning chest pain.  The pain has been constant, and is aggravated by positional movement but is not clearly exertional.  She reports similar pain in 2011 that prompted a cardiology evaluation then including a cardiac catheterization.  She denies any associated symptoms.   Physical Exam - key components related to admission:  Filed Vitals:   11/08/12 1930 11/08/12 2015 11/08/12 2200 11/09/12 0557  BP: 157/81 173/82 150/79 134/57  Pulse: 73 66  62  Temp:  98.2 F (36.8 C)  98.3 F (36.8 C)  TempSrc:    Oral  Resp:  16    Height:  5\' 4"  (1.626 m)    Weight:  212 lb (96.163 kg)  212 lb (96.163 kg)  SpO2: 95% 95%  97%   General: Alert, no distress Lungs: Clear Heart: Regular; S1-S2, no S3, no S4, no murmurs Chest: There is tenderness to palpation of the costosternal area bilaterally Abdomen: Bowel sounds present, soft, nontender Extremities: No edema   Lab results:   Basic Metabolic Panel:  Basename 11/09/12 0442 11/08/12 1525  NA 140 142  K 4.2 4.5  CL 102 103  CO2 28 29  GLUCOSE 81 99  BUN 22 21  CREATININE 1.75* 1.53*  CALCIUM 9.5 9.9  MG -- --  PHOS -- --     CBC:  Basename 11/09/12 0442 11/08/12 1525  WBC 8.7 8.3  NEUTROABS -- --  HGB 11.3* 11.7*  HCT 35.7* 36.7  MCV 81.3 80.7  PLT 228 254     Cardiac Enzymes:  Basename 11/09/12 0420 11/08/12 2156  CKTOTAL -- --  CKMB -- --  CKMBINDEX -- --  TROPONINI <0.30 <0.30    CBG:  Basename 11/09/12 0715 11/08/12 2016 11/08/12 1355  GLUCAP 84 75 113*   Hemoglobin A1C:  Basename 11/08/12 1411  HGBA1C 7.8    Imaging results:  Dg Chest 2 View  11/08/2012  *RADIOLOGY REPORT*  Clinical Data: Chest pain.  CHEST - 2 VIEW  Comparison: 10/13/2011 and earlier studies  Findings: Heart size upper limits normal.  Lungs clear.  No effusion.  Regional bones unremarkable.  IMPRESSION:  Borderline cardiomegaly.   Original Report Authenticated By: D. Andria Rhein, MD     Other results: EKG: normal EKG, normal sinus rhythm.  Assessment & Plan by Problem:  1.  Chest pain.  Patient has no evidence of acute coronary syndrome by enzymes or EKG.  Her pain is somewhat better today; given her parasternal tenderness, this may be due to costochondritis.  Given her history of GERD, an esophageal source is also possible.  Cardiology was consulted, and Dr. Eden Emms has recommended an outpatient Myoview study next week.  Would continue her PPI and schedule outpatient GI follow up as well in the event that the cardiology workup is negative.  2.  Other problems as per resident physician note.

## 2012-11-09 NOTE — Care Management Note (Signed)
    Page 1 of 1   11/09/2012     12:25:40 PM   CARE MANAGEMENT NOTE 11/09/2012  Patient:  [name], [name]   Account Number:  [id number]  Date Initiated:  11/09/2012  Documentation initiated by:  GRAVES-BIGELOW,Patrycja Mumpower  Subjective/Objective Assessment:   Pt admitted with cp. Plan for d/ today.     Action/Plan:   No needs from CM at this time.   Anticipated DC Date:  11/09/2012   Anticipated DC Plan:  HOME/SELF CARE      DC Planning Services  CM consult      Choice offered to / List presented to:             Status of service:  Completed, signed off Medicare Important Message given?   (If response is "NO", the following Medicare IM given date fields will be blank) Date Medicare IM given:   Date Additional Medicare IM given:    Discharge Disposition:  HOME/SELF CARE  Per UR Regulation:  Reviewed for med. necessity/level of care/duration of stay  If discussed at Long Length of Stay Meetings, dates discussed:    Comments:

## 2012-11-10 ENCOUNTER — Telehealth: Payer: Self-pay | Admitting: *Deleted

## 2012-11-10 DIAGNOSIS — G5603 Carpal tunnel syndrome, bilateral upper limbs: Secondary | ICD-10-CM

## 2012-11-10 NOTE — Discharge Summary (Signed)
Internal Medicine Teaching [facility] Discharge Note  Name: [name] MRN: [id number] DOB: 1951/03/12 62 y.o.  Date of Admission: 11/08/2012  3:47 PM Date of Discharge: 11/09/2012 Attending Physician: Margarito Liner, MD  Discharge Diagnosis: Active Problems:  Uncontrolled type 2 diabetes mellitus with insulin therapy  HYPERLIPIDEMIA  PERIPHERAL NEUROPATHY  HYPERTENSION  CAD  GERD  Chronic renal insufficiency  Depression   Discharge Medications:   Medication List     As of 11/10/2012  5:30 PM    TAKE these medications         ACCU-CHEK FASTCLIX LANCETS Misc   1 each by Does not apply route 3 (three) times daily before meals. Dx code 250.00      amLODipine 5 MG tablet   Commonly known as: NORVASC   Take 1 tablet (5 mg total) by mouth daily.      aspirin 81 MG tablet   Take 81 mg by mouth daily.      buPROPion 300 MG 24 hr tablet   Commonly known as: WELLBUTRIN XL   Take 300 mg by mouth daily.      citalopram 40 MG tablet   Commonly known as: CELEXA   Take 40 mg by mouth daily.      DEXILANT 60 MG capsule   Generic drug: dexlansoprazole   Take 60 mg by mouth daily.      ferrous sulfate 325 (65 FE) MG tablet   Take 1 tablet (325 mg total) by mouth daily.      fluticasone 50 MCG/ACT nasal spray   Commonly known as: FLONASE   Place 2 sprays into the nose daily.      gabapentin 800 MG tablet   Commonly known as: NEURONTIN   Take 1 tablet (800 mg total) by mouth 3 (three) times daily. Take one tablet by mouth in am and 2 tablets by mouth in pm      glucose blood test strip   Use to check blood sugar before meals dx code 250.00      HYDROcodone-acetaminophen 10-325 MG per tablet   Commonly known as: NORCO   Take 1 tablet by mouth every 6 (six) hours as needed for pain.      insulin aspart 100 UNIT/ML injection   Commonly known as: novoLOG   Inject 10 Units into the skin 3 (three) times daily before meals.      insulin glargine 100 UNIT/ML injection    Commonly known as: LANTUS   Inject 50 Units into the skin at bedtime.      Insulin Pen Needle 31G X 5 MM Misc   Box of 100 - uses up to 5 times per day according to CBG      isosorbide mononitrate 60 MG 24 hr tablet   Commonly known as: IMDUR   Take 1 tablet (60 mg total) by mouth daily.      losartan-hydrochlorothiazide 100-25 MG per tablet   Commonly known as: HYZAAR   Take 1 tablet by mouth daily.      metoprolol succinate 25 MG 24 hr tablet   Commonly known as: TOPROL-XL   Take 1 tablet (25 mg total) by mouth daily.      senna 8.6 MG tablet   Commonly known as: SENOKOT   Take 4 tablets by mouth daily.      simvastatin 20 MG tablet   Commonly known as: ZOCOR   Take 1 tablet (20 mg total) by mouth at bedtime.  Disposition and follow-up:   [name] was discharged from [facility] in stable condition.  At the hospital follow up visit please address appropriate follow-up for Myoview per Dr. Eden Emms for chest pain and Dr. Matthias Hughs for GERD evaluation.  Follow-up Appointments:     Follow-up Information    Follow up with Denton Ar, MD. On 11/21/2012. (2:15 PM)    Contact information:   [facility] [address] Ground Floor [place] [address] [phone]       Schedule an appointment as soon as possible for a visit with Rollene Rotunda, MD.   Contact information:   [facility] [address] [place] [address] [phone]       Schedule an appointment as soon as possible for a visit with Florencia Reasons, MD.   Contact information:   [facility] [address] [place] [address] [phone]         Discharge Orders    Future Appointments: Provider: Department: Dept Phone: Center:   11/16/2012 9:45 AM [facility] SITE 3 NUCLEAR MED [phone] None   11/21/2012 2:15 PM Larey Seat, MD [facility] [phone] [facility]   11/22/2012 9:45 AM Rollene Rotunda, MD [facility] Main Office [place]) [phone] [facility]     Future Orders Please Complete By Expires   Diet - low sodium heart healthy      Increase activity slowly      Discharge instructions      Comments:   Continue your medications as before.  Keep your scheduled appointments.      Consultations: Treatment Team:  Rounding [facility], MD  Procedures Performed:  Dg Chest 2 View  11/08/2012  *RADIOLOGY REPORT*  Clinical Data: Chest pain.  CHEST - 2 VIEW  Comparison: 10/13/2011 and earlier studies  Findings: Heart size upper limits normal.  Lungs clear.  No effusion.  Regional bones unremarkable.  IMPRESSION:  Borderline cardiomegaly.   Original Report Authenticated By: D. Andria Rhein, MD    Admission HPI: Ms. Strom is a 62 yo female with hx significant for non-obstructive CAD, anxiety, diabetes mellitus with gastroparesis and peripheral neuropathy, hypertension, hyperlipidemia, GERD, coronary artery aneurysm and prior admission for noncardiac chest pain. She presented to clinic with complaints of tight burning chest pain for the past 5 days. She reports that the pain started gradually and thinks that she may have been shopping but is unclear when it exactly started. States that the pain is exacerbated by movement and is relieved somewhat by Vicodin. She reports compliance with medications including her daily PPI therapy and antihypertensive regimen. She further states that this pain feels exactly like the chest pain experienced on several occasions in 2011. At that time, she was evaluated by Cardiology with subsequent catheterization revealing non obstruction CAD, normal LV function with EF 65% and otherwise no objective evidence of ischemia.  Admission Physical Exam:  Blood pressure 138/69, pulse 66, temperature 98.3 F (36.8 C), temperature source Oral, resp. rate 18, SpO2 97.00%. General:  Well-developed, well-nourished, AA female, in no acute distress HEENT: Normocephalic, atraumatic, PERRLA, EOMI, Moist mucous membranes, Oropharynx nonerythematous Neck: supple, no masses, no carotid Bruits, no JVD appreciated. Lungs: Normal respiratory effort. Clear to auscultation bilaterally from apices to bases without crackles or wheezes appreciated. Heart: normal rate, regular rhythm, normal S1 and S2, no gallop, murmur, or rubs appreciated. Abdomen: BS normoactive. Soft, Nondistended, non-tender. No masses or  organomegaly appreciated. Extremities: trace pretibial edema, distal pulses intact Neurologic: grossly non-focal, alert and oriented x3, appropriate and cooperative throughout examination.    Hospital Course by problem list:  1. Atypical Chest pain concerning for unstable angina: Given her cardiac risk factors of hypertension, hyperlipidemia, and diabetes and history of non-obstructive coronary artery disease and coronary aneurysm primary concern and management for cardiac etiology for her chest pain.  She underwent cardiac catheterization November 18, 2009 that  revealed non-obstructive coronary disease with circumflex 50%, distal RCA 60-70%, normal LV function, and ejection fraction 65%.  Myoview from the same work-up in 2011 was negative for inducible ischemia at that time.  Patient was managed with aspirin, sublingual nitoglycerin, and a GI cocktail of maalox, lidocaine, and anti-spasmodic agent donnatal which relieved her chest discomfort minimally (from 9-10/10 pain to 8/10). EKG showed Normal Sinus Rhthym without signs of ischemia, Chest XRay was significant only for borderline cardiomegaly, point of care troponin was negative.  Her Cardiologist Dr. Eden Emms was consulted with subsequent recommendations for continued medical management and outpatient Myoview if cardiac enzymes remained negative. She was observed on telemetry overnight without acute events noted. She was continued on her  home medications of aspirin, metoprolol, amlodipine, Imdur, simvastatin, and losartan-HCT.  Her cardiac enzymes were cycled and were negative x 3, and her liver function tests including lipase level were unremarkable.  By day of discharge, the patient reported improvement in her chest pain. She will follow-up with Dr. Antoine Poche for an outpatient myoview study in the next week.  The pain was presumed to be primarily musculoskeletal in origin especially given its exacerbation with movement.  There could also be a gastrointestinal component given her GERD and gastroparesis. possibly with a GI component.  She is to follow up with Dr. Matthias Hughs of [facility] following her discharge.  The patient was informed of and was agreeable to the aforementioned plan.  She was discharged on her home regimen in stable condition.    2. Type 2 Diabetes Mellitus with Peripheral Neuropathy and Gastroparesis: Patient reported that she is controlled with insulin therapy and takes Lantus 50U qhs and Novolog 10U qAC.  Additionally, she takes gabapentin 800mg  TID for neuropathy.  On admission, her HbA1c was found to be 7.8.  She was maintained on her home regimen of insulin and gabapentin.  Her glucose levels were checked regularly and were fairly well-controlled throughout her hospital course.  For her gastroparesis, she was continued on a proton pump inhibitor and stool softeners while inpatient, and was discharged on her home regimen of Dexilant 60mg  qday and Senna.  She is to follow-up with Internal medicine and Gastroenterology as noted above.   3. Hyperlipidemia:  Patient's hyperlipidemia was controlled with simvastatin 20mg  qday.  This was continued in the inpatient setting, and she was discharged on her home regimen in stable condition.    4. Hypertension: Prior to admission, patient was managed with a regimen of Toprol XL 25mg  qday, Amlodipine 5mg  qday, Imdur 60mg  qday, and Losartan-HCT 100-25mg  qday.  All of these  home mediations were continued during hospitalization. The patient's pressures were fairly well-controlled throughout her hospital course, and she was discharged on all of her home medications in stable condition.     5. Anxiety and Depression: Remained stable during hospitalization on home regimen of Celexa 40mg  qday and Wellbutrin XL 300mg  qday.    Discharge Vitals:  BP 132/73  Pulse 66  Temp 98.3 F (36.8 C) (Oral)  Resp 16  Ht 5\' 4"  (  1.626 m)  Wt 212 lb (96.163 kg)  BMI 36.39 kg/m2  SpO2 97%  Discharge Labs:   Basic Metabolic Panel:  Lab 11/09/12 9604 11/08/12 1525  NA 140 142  K 4.2 4.5  CL 102 103  CO2 28 29  GLUCOSE 81 99  BUN 22 21  CREATININE 1.75* 1.53*  CALCIUM 9.5 9.9  MG -- --  PHOS -- --   Liver Function Tests:  Lab 11/09/12 0442  AST 19  ALT 14  ALKPHOS 68  BILITOT 0.2*  PROT 7.3  ALBUMIN 3.4*    Lab 11/09/12 0442  LIPASE 49  AMYLASE --   CBC:  Lab 11/09/12 0442 11/08/12 1525  WBC 8.7 8.3  NEUTROABS -- --  HGB 11.3* 11.7*  HCT 35.7* 36.7  MCV 81.3 80.7  PLT 228 254   Cardiac Enzymes:  Lab 11/09/12 0933 11/09/12 0420 11/08/12 2156  CKTOTAL -- -- --  CKMB -- -- --  CKMBINDEX -- -- --  TROPONINI <0.30 <0.30 <0.30   CBG:  Lab 11/09/12 1111 11/09/12 0715 11/08/12 2016 11/08/12 1355  GLUCAP 130* 84 75 113*   Hemoglobin A1C:  Lab 11/08/12 1411  HGBA1C 7.8    Signed: Kahner Yanik 11/10/2012, 5:30 PM   Time Spent on Discharge: >41min Services Ordered on Discharge: None Equipment Ordered on Discharge: None

## 2012-11-10 NOTE — Telephone Encounter (Signed)
Pt calls and states she would like dr Collier Bullock to know that she has a problem with her hands becoming numb, this happens when she sleeps, or if she holds something for awhile.

## 2012-11-14 NOTE — Assessment & Plan Note (Addendum)
Patient with persistent pain with numbness despite therapy with brace.  She is also on chronic opiate therapy which does not relieve pain.  Given chronic renal failure, she is not an ideal candidate for NSAID therapy.  She has had carpel tunnel release in the past.  Will place referral for hand surgery for further evaluation vs glucocorticoid injection.

## 2012-11-15 ENCOUNTER — Telehealth: Payer: Self-pay | Admitting: Dietician

## 2012-11-15 ENCOUNTER — Other Ambulatory Visit: Payer: Self-pay | Admitting: *Deleted

## 2012-11-15 DIAGNOSIS — I10 Essential (primary) hypertension: Secondary | ICD-10-CM

## 2012-11-15 MED ORDER — AMLODIPINE BESYLATE 5 MG PO TABS: 5.0000 mg | ORAL_TABLET | Freq: Every day | ORAL | Status: DC

## 2012-11-15 NOTE — Telephone Encounter (Signed)
Discharge date:11-09-12 Call date: 11-15-12 Hospital follow up appointment date:   Calling to assist with transition of care from hospital to home.  Discharge medications reviewed:  Able to fill all prescriptions?  Patient aware of hospital follow up appointments.   No problems with transportation.  Other problems/concerns:

## 2012-11-16 ENCOUNTER — Encounter (HOSPITAL_COMMUNITY): Payer: PRIVATE HEALTH INSURANCE

## 2012-11-20 NOTE — Telephone Encounter (Signed)
left message reminding patient about her appointment tomorrow.

## 2012-11-21 ENCOUNTER — Encounter: Payer: Self-pay | Admitting: Internal Medicine

## 2012-11-21 ENCOUNTER — Ambulatory Visit (INDEPENDENT_AMBULATORY_CARE_PROVIDER_SITE_OTHER): Payer: PRIVATE HEALTH INSURANCE | Admitting: Internal Medicine

## 2012-11-21 VITALS — BP 102/61 | HR 79 | Temp 97.2°F | Wt 212.9 lb

## 2012-11-21 DIAGNOSIS — E1165 Type 2 diabetes mellitus with hyperglycemia: Secondary | ICD-10-CM

## 2012-11-21 DIAGNOSIS — R079 Chest pain, unspecified: Secondary | ICD-10-CM

## 2012-11-21 DIAGNOSIS — G8929 Other chronic pain: Secondary | ICD-10-CM

## 2012-11-21 DIAGNOSIS — G5600 Carpal tunnel syndrome, unspecified upper limb: Secondary | ICD-10-CM

## 2012-11-21 DIAGNOSIS — G5603 Carpal tunnel syndrome, bilateral upper limbs: Secondary | ICD-10-CM

## 2012-11-21 DIAGNOSIS — IMO0002 Reserved for concepts with insufficient information to code with codable children: Secondary | ICD-10-CM

## 2012-11-21 MED ORDER — DICLOFENAC SODIUM 1 % TD GEL: 2.0000 g | Freq: Three times a day (TID) | TRANSDERMAL | Status: DC | PRN

## 2012-11-21 NOTE — Progress Notes (Signed)
Internal Medicine Clinic Visit    HPI:  [name] is a 62 y.o. year old female with a history of hypertension, GERD, chest pain of unknown etiology, who presents for followup.  Patient would like to discuss the pain in her hands: She states that she has had several years of pain, burning, numbness and itching in hands that travels up arms. Symptoms are intermittent. She has been told that she has carpal tunnel. Often Wakes up at night with pain. Has used brace but it hurts her fingers to use. Few years of pain, gradually getting worse. If she holds something too long, her hands gets numb.   This morning in shower, felt like her shoulders and chest were getting "pulled". She continues to have chronic intermittent chest pain, 5-7 of 10. Feels like pressure that is tight and deep. Pain is as previously described, not associated with activity or food necessarily. She does say that it hurts when she presses on it.  Regarding her chest pain, She was scheduled to see cardiology as a hospital followup, but she didn't keep the appt. she states that the cardiologist wanted to do stress test but she canceled it. Two years ago had a normal stress test. Saw GI who said he didn't think it was GI in nature.   Patient denies fever, chills, shortness of breath, lower extremity edema, nausea, vomiting.   Past Medical History  Diagnosis Date  . Hypertension   . Peripheral neuropathy   . Cervicalgia   . Low back pain syndrome   . Hyperlipidemia   . Vitamin D deficiency   . Vitamin B12 deficiency   . Iliotibial band syndrome   . Anemia   . Borderline personality disorder   . Nonorganic psychosis   . GERD (gastroesophageal reflux disease)   . Diabetes mellitus   . Depression   . Diabetic gastroparesis   . Sleep apnea   . CAD (coronary artery disease)     Catherization 11/18/09>nonobstructive CAD , normal LV function, EF 65%  . Health maintenance examination     Colonoscopy 2009> normal; Diabetic eye  exam 12/2008. No diabetic retinopathy; Mammogram 11/10> No evidence of malignancy    Past Surgical History  Procedure Date  . Abdominal hysterectomy   . Hand surgery      ROS:  A complete review of systems was otherwise negative, except as noted in the HPI.  Allergies: Indomethacin; Penicillins; Cimetidine; and Sulfonamide derivatives  Medications: Current Outpatient Prescriptions  Medication Sig Dispense Refill  . ACCU-CHEK FASTCLIX LANCETS MISC 1 each by Does not apply route 3 (three) times daily before meals. Dx code 250.00  102 each  12  . amLODipine (NORVASC) 5 MG tablet Take 1 tablet (5 mg total) by mouth daily.  90 tablet  1  . aspirin 81 MG tablet Take 81 mg by mouth daily.        Marland Kitchen buPROPion (WELLBUTRIN XL) 300 MG 24 hr tablet Take 300 mg by mouth daily.        . citalopram (CELEXA) 40 MG tablet Take 40 mg by mouth daily.        Marland Kitchen dexlansoprazole (DEXILANT) 60 MG capsule Take 60 mg by mouth daily.        . ferrous sulfate 325 (65 FE) MG tablet Take 1 tablet (325 mg total) by mouth daily.  30 tablet  3  . fluticasone (FLONASE) 50 MCG/ACT nasal spray Place 2 sprays into the nose daily.  16 g  2  .  gabapentin (NEURONTIN) 800 MG tablet Take 1 tablet (800 mg total) by mouth 3 (three) times daily. Take one tablet by mouth in am and 2 tablets by mouth in pm  90 tablet  11  . glucose blood (ACCU-CHEK SMARTVIEW) test strip Use to check blood sugar before meals dx code 250.00  100 each  12  . HYDROcodone-acetaminophen (NORCO) 10-325 MG per tablet Take 1 tablet by mouth every 6 (six) hours as needed for pain.  60 tablet  0  . insulin aspart (NOVOLOG) 100 UNIT/ML injection Inject 10 Units into the skin 3 (three) times daily before meals.  10 mL  3  . insulin glargine (LANTUS SOLOSTAR) 100 UNIT/ML injection Inject 50 Units into the skin at bedtime.  3 pen  12  . Insulin Pen Needle 31G X 5 MM MISC Box of 100 - uses up to 5 times per day according to CBG  100 each  11  . isosorbide  mononitrate (IMDUR) 60 MG 24 hr tablet Take 1 tablet (60 mg total) by mouth daily.  30 tablet  6  . losartan-hydrochlorothiazide (HYZAAR) 100-25 MG per tablet Take 1 tablet by mouth daily.  30 tablet  5  . metoprolol succinate (TOPROL-XL) 25 MG 24 hr tablet Take 1 tablet (25 mg total) by mouth daily.  30 tablet  6  . senna (SENOKOT) 8.6 MG tablet Take 4 tablets by mouth daily.        . simvastatin (ZOCOR) 20 MG tablet Take 1 tablet (20 mg total) by mouth at bedtime.  90 tablet  3    History   Social History  . Marital Status: Married    Spouse Name: N/A    Number of Children: N/A  . Years of Education: N/A   Occupational History  . Not on file.   Social History Main Topics  . Smoking status: Former Smoker    Types: Cigarettes    Quit date: 10/25/2002  . Smokeless tobacco: Not on file  . Alcohol Use: No  . Drug Use: No  . Sexually Active: Not on file   Other Topics Concern  . Not on file   Social History Narrative  . No narrative on file    family history includes Arthritis in her mother; Diabetes in her brother, mother, and sister; Heart disease in her father; Hyperlipidemia in her mother; and Hypertension in her mother.  Physical Exam Blood pressure 102/61, pulse 79, temperature 97.2 F (36.2 C), temperature source Oral, weight 212 lb 14.4 oz (96.571 kg). General:  No acute distress, alert and oriented x 3, well-appearing  HEENT:  PERRL, EOMI, no lymphadenopathy, moist mucous membranes Cardiovascular:  Regular rate and rhythm, no murmurs, rubs or gallops. Tenderness to palpation above the xiphoid process and all along sternum, pectoralis muscles are tender to touch. Respiratory:  Clear to auscultation bilaterally, no wheezes, rales, or rhonchi, good air movement. Abdomen:  Soft, nondistended, nontender, positive bowel sounds Extremities:  Warm and well-perfused, no clubbing, cyanosis, or edema. Reports decreased sensation to light-touch over anterior digits 1-4. No MCP  tenderness. No joint deformities or effusions. No overlying erythema. Skin: Warm, dry, no rashes Neuro: Not anxious appearing, no depressed mood, normal affect   Labs: Lab Results  Component Value Date   CREATININE 1.75* 11/09/2012   BUN 22 11/09/2012   NA 140 11/09/2012   K 4.2 11/09/2012   CL 102 11/09/2012   CO2 28 11/09/2012   Lab Results  Component Value Date   WBC 8.7  11/09/2012   HGB 11.3* 11/09/2012   HCT 35.7* 11/09/2012   MCV 81.3 11/09/2012   PLT 228 11/09/2012      Assessment and Plan:    FOLLOWUP: AZZURE GARABEDIAN will follow back up in our clinic in approximately 4-6 weeks. LARNA CAPELLE knows to call our clinic in the meantime with any questions or new issues.

## 2012-11-21 NOTE — Assessment & Plan Note (Addendum)
Patient was stable chronic chest pain. Her pain on exam today appears to be mostly musculoskeletal in nature. She was recently admitted for the same type of chest pain, ruled out for cardiac or other failed causes. She had a normal stress test in 2012 as well as a cardiac cath in January 2011 with several lesions, none of which were bad enough to be stented. Patient does not report any change in her pain quality or severity recently, though, if she were to develop such symptoms, we discussed going to the ED immediately. With known lesions on cardiac catheterization, she would be at risk for plaque rupture and acute MI.  -Would ideally like to give NSAIDs as her pain appears to be MSK/inflammatory chest wall pain, however, given patient's low GFR and chronic kidney disease, I am concerned about further renal insult -will try voltaren gel to apply topically -May consider using Cymbalta for mood as well as chronic pain -Discussed with patient that if her chest pain should change in quality or severity, these are the reasons to seek medical attention immediately

## 2012-11-21 NOTE — Assessment & Plan Note (Signed)
Patient continues to have symptoms of pain and numbness.  -Will refer for nerve conduction studies -Followup with PCP in one month

## 2012-11-21 NOTE — Assessment & Plan Note (Addendum)
Patient had still been using 48 u of Lantus since last visit. Her last A1c went up from 6.9-7.8 although she had less hypoglycemic events. She does report frequent hyperglycemia in the mornings. She denies frequent hypoglycemic symptoms.  -Increase lantus to 50u QHS -Followup with PCP in one month -Discussed carb counting and diabetic diet, however, patient is not willing to make adjustments in diet at this time. She knows that she is eating lots of carbs.

## 2012-11-22 ENCOUNTER — Ambulatory Visit: Payer: PRIVATE HEALTH INSURANCE | Admitting: Cardiology

## 2012-11-22 ENCOUNTER — Ambulatory Visit: Payer: PRIVATE HEALTH INSURANCE | Admitting: Cardiovascular Disease

## 2012-11-23 ENCOUNTER — Telehealth: Payer: Self-pay | Admitting: *Deleted

## 2012-11-23 NOTE — Telephone Encounter (Signed)
Pt informed and voices understanding.  She was also informed about Voltaren gel that was ordered and informed about nerve conduction referral.

## 2012-11-23 NOTE — Telephone Encounter (Signed)
Pt called and reports she woke up today with no feeling in both hands. Pain running up her arms.  It took 30 minutes for her to get feeling in arms. She went back to sleep and woke up with same problem.   She can't feed or bathe herself with the numbness She is sore with pain to chest, shoulders and upper arms.  I called pt back and she states this is not new, she was seen in clinic on 1/28 by Dr Collier Bullock for same problems. ( seen on 1/15 also) Pt has had this numbness for a couple weeks.   She was sent to ED for chest pain on 1/15 and cleared.  Pt also states she saw GI doctor and everything was fine.  Pt has scheduled appointment 2/26  Please advise

## 2012-11-23 NOTE — Telephone Encounter (Signed)
The patient's pain in the hands is not new. However for any unusual chest pain, especially if it is radiating to arms, neck, jaw, I would ask her to go to the ED.  Thanks. Arwen Haseley

## 2012-12-06 ENCOUNTER — Other Ambulatory Visit: Payer: Self-pay | Admitting: *Deleted

## 2012-12-06 DIAGNOSIS — M1990 Unspecified osteoarthritis, unspecified site: Secondary | ICD-10-CM

## 2012-12-06 MED ORDER — HYDROCODONE-ACETAMINOPHEN 10-325 MG PO TABS: 1.0000 | ORAL_TABLET | Freq: Four times a day (QID) | ORAL | Status: DC | PRN

## 2012-12-06 NOTE — Telephone Encounter (Signed)
Called to pharm 

## 2012-12-20 ENCOUNTER — Encounter: Payer: Self-pay | Admitting: Internal Medicine

## 2012-12-20 ENCOUNTER — Ambulatory Visit (INDEPENDENT_AMBULATORY_CARE_PROVIDER_SITE_OTHER): Payer: PRIVATE HEALTH INSURANCE | Admitting: Internal Medicine

## 2012-12-20 VITALS — BP 127/65 | HR 67 | Temp 97.1°F | Resp 20 | Ht 63.5 in | Wt 214.7 lb

## 2012-12-20 DIAGNOSIS — E785 Hyperlipidemia, unspecified: Secondary | ICD-10-CM

## 2012-12-20 DIAGNOSIS — IMO0001 Reserved for inherently not codable concepts without codable children: Secondary | ICD-10-CM

## 2012-12-20 DIAGNOSIS — IMO0002 Reserved for concepts with insufficient information to code with codable children: Secondary | ICD-10-CM

## 2012-12-20 DIAGNOSIS — F3289 Other specified depressive episodes: Secondary | ICD-10-CM

## 2012-12-20 DIAGNOSIS — E1165 Type 2 diabetes mellitus with hyperglycemia: Secondary | ICD-10-CM

## 2012-12-20 DIAGNOSIS — F32A Depression, unspecified: Secondary | ICD-10-CM

## 2012-12-20 DIAGNOSIS — I10 Essential (primary) hypertension: Secondary | ICD-10-CM

## 2012-12-20 DIAGNOSIS — M791 Myalgia, unspecified site: Secondary | ICD-10-CM

## 2012-12-20 DIAGNOSIS — F329 Major depressive disorder, single episode, unspecified: Secondary | ICD-10-CM

## 2012-12-20 LAB — CK TOTAL AND CKMB (NOT AT ARMC)
CK, MB: 4.3 ng/mL — ABNORMAL HIGH (ref 0.3–4.0)
Relative Index: 1.8 (ref 0.0–2.5)
Total CK: 242 U/L — ABNORMAL HIGH (ref 7–177)

## 2012-12-20 LAB — GLUCOSE, CAPILLARY: Glucose-Capillary: 87 mg/dL (ref 70–99)

## 2012-12-20 MED ORDER — METOPROLOL SUCCINATE ER 25 MG PO TB24: 25.0000 mg | ORAL_TABLET | Freq: Every day | ORAL | Status: DC

## 2012-12-20 NOTE — Patient Instructions (Signed)
General Instructions: I have referred you to Lynnae January, our social worker, to see if we can get you plugged into another therapist/psychiatrist  We will do some general lab work today to see if there is anything else contributing to your pain.  Please continue to work on your diet and try not to eat a lot of junk food at night - try to stick to vegetables and sugar free snacks.  Please be sure to bring all of your medications with you to every visit.  Should you have any new or worsening symptoms, please be sure to call the clinic at [phone].   Treatment Goals:  Goals (1 Years of Data) as of 12/20/12         As of Today 11/21/12 11/09/12 11/09/12 11/08/12     Blood Pressure    . Blood Pressure < 130/80  127/65 102/61 132/73 134/57 150/79     Result Component    . HEMOGLOBIN A1C < 7.0      7.8    . LDL CALC < 100            Progress Toward Treatment Goals:  Treatment Goal 12/20/2012  Hemoglobin A1C deteriorated  Blood pressure at goal    Self Care Goals & Plans:  Self Care Goal 12/20/2012  Manage my medications take my medicines as prescribed; bring my medications to every visit; refill my medications on time  Monitor my health keep track of my blood glucose; bring my glucose meter and log to each visit  Be physically active find an activity I enjoy    Home Blood Glucose Monitoring 12/20/2012  Check my blood sugar 3 times a day  When to check my blood sugar before breakfast; before lunch; before dinner     Care Management & Community Referrals:  Referral 12/20/2012  Referrals made for care management support social worker

## 2012-12-20 NOTE — Progress Notes (Signed)
Subjective:   Patient ID: [name] female   DOB: November 01, 1950 62 y.o.   MRN: [id number]  HPI: [name] is a 62 y.o. with woman with HTN & uncontrolled DM presents for follow up.  Depression: Doesn't sleep well (falling asleep and staying asleep), hurts all over (msucles, joints), doesn't enjoy activities she once enjoyed (like watching TV), increasingly fatigued, doesn't feel like mental health wants to help, she no longer sees a therapist (no longer offered); increased appetite; feels guilty about weight; feels like concentration is off; easily agitated. No SI/HI.   Is very concerned about diffuse pain all over. When she went to mental health to discuss changing mgmt to cymbalta, they were not receptive and she notes that she no longer sees a Veterinary surgeon.  Regarding her DM, she continues to check her sugar TID, and has about 3 episodes where CBG<70 and about 4-5 episodes with CBG>160.  There is no specific pattern that identifies a particular time of day. She admits to eating high carb foods frequently, and she does not have appropriate support from her husband, who tells her there is no point in eating no sugar foods.    Past Medical History  Diagnosis Date  . Hypertension   . Peripheral neuropathy   . Cervicalgia   . Low back pain syndrome   . Hyperlipidemia   . Vitamin D deficiency   . Vitamin B12 deficiency   . Iliotibial band syndrome   . Anemia   . Borderline personality disorder   . Nonorganic psychosis   . GERD (gastroesophageal reflux disease)   . Diabetes mellitus   . Depression   . Diabetic gastroparesis   . Sleep apnea   . CAD (coronary artery disease)     Catherization 11/18/09>nonobstructive CAD , normal LV function, EF 65%  . Health maintenance examination     Colonoscopy 2009> normal; Diabetic eye exam 12/2008. No diabetic retinopathy; Mammogram 11/10> No evidence of malignancy   Current Outpatient Prescriptions  Medication Sig Dispense Refill  .  ACCU-CHEK FASTCLIX LANCETS MISC 1 each by Does not apply route 3 (three) times daily before meals. Dx code 250.00  102 each  12  . amLODipine (NORVASC) 5 MG tablet Take 1 tablet (5 mg total) by mouth daily.  90 tablet  1  . aspirin 81 MG tablet Take 81 mg by mouth daily.        Marland Kitchen buPROPion (WELLBUTRIN XL) 300 MG 24 hr tablet Take 300 mg by mouth daily.        . citalopram (CELEXA) 40 MG tablet Take 40 mg by mouth daily.        Marland Kitchen dexlansoprazole (DEXILANT) 60 MG capsule Take 60 mg by mouth daily.        . diclofenac sodium (VOLTAREN) 1 % GEL Apply 2 g topically 3 (three) times daily as needed.  1 Tube  0  . ferrous sulfate 325 (65 FE) MG tablet Take 1 tablet (325 mg total) by mouth daily.  30 tablet  3  . fluticasone (FLONASE) 50 MCG/ACT nasal spray Place 2 sprays into the nose daily.  16 g  2  . gabapentin (NEURONTIN) 800 MG tablet Take 1 tablet (800 mg total) by mouth 3 (three) times daily. Take one tablet by mouth in am and 2 tablets by mouth in pm  90 tablet  11  . glucose blood (ACCU-CHEK SMARTVIEW) test strip Use to check blood sugar before meals dx code 250.00  100 each  12  .  HYDROcodone-acetaminophen (NORCO) 10-325 MG per tablet Take 1 tablet by mouth every 6 (six) hours as needed for pain.  60 tablet  0  . insulin aspart (NOVOLOG) 100 UNIT/ML injection Inject 10 Units into the skin 3 (three) times daily before meals.  10 mL  3  . insulin glargine (LANTUS SOLOSTAR) 100 UNIT/ML injection Inject 50 Units into the skin at bedtime.  3 pen  12  . Insulin Pen Needle 31G X 5 MM MISC Box of 100 - uses up to 5 times per day according to CBG  100 each  11  . isosorbide mononitrate (IMDUR) 60 MG 24 hr tablet Take 1 tablet (60 mg total) by mouth daily.  30 tablet  6  . losartan-hydrochlorothiazide (HYZAAR) 100-25 MG per tablet Take 1 tablet by mouth daily.  30 tablet  5  . metoprolol succinate (TOPROL-XL) 25 MG 24 hr tablet Take 1 tablet (25 mg total) by mouth daily.  30 tablet  6  . senna (SENOKOT)  8.6 MG tablet Take 4 tablets by mouth daily.        . simvastatin (ZOCOR) 20 MG tablet Take 1 tablet (20 mg total) by mouth at bedtime.  90 tablet  3   No current facility-administered medications for this visit.   Family History  Problem Relation Age of Onset  . Diabetes Mother   . Hypertension Mother   . Hyperlipidemia Mother   . Arthritis Mother   . Heart disease Father   . Diabetes Sister   . Diabetes Brother    History   Social History  . Marital Status: Married    Spouse Name: N/A    Number of Children: N/A  . Years of Education: N/A   Social History Main Topics  . Smoking status: Former Smoker    Types: Cigarettes    Quit date: 10/25/2002  . Smokeless tobacco: None  . Alcohol Use: No  . Drug Use: No  . Sexually Active: None   Other Topics Concern  . None   Social History Narrative  . None   Review of Systems: Constitutional: Denies fever, chills, diaphoresis HEENT: Denies photophobia, eye pain, redness, hearing loss, ear pain, congestion, sore throat, rhinorrhea, sneezing, mouth sores, trouble swallowing, neck pain, neck stiffness and tinnitus.   Respiratory: Denies SOB, DOE, cough and wheezing.   Cardiovascular: Denies palpitations and leg swelling.  Gastrointestinal: Denies nausea, vomiting, abdominal pain, diarrhea, constipation, blood in stool and abdominal distention.  Genitourinary: Denies dysuria, urgency, frequency, hematuria, flank pain and difficulty urinating.  Skin: Denies pallor, rash and wound.  Neurological: Denies dizziness, seizures, syncope, weakness, light-headedness, numbness and headaches.  Psychiatric/Behavioral: Denies suicidal ideation  Objective:  Physical Exam: Filed Vitals:   12/20/12 1437  BP: 127/65  Pulse: 67  Temp: 97.1 F (36.2 C)  TempSrc: Oral  Resp: 20  Height: 5' 3.5" (1.613 m)  Weight: 214 lb 11.2 oz (97.387 kg)  SpO2: 97%   Constitutional: Vital signs reviewed.  Patient is an obese african [place] woman in no  acute distress and cooperative with exam.  Head: Normocephalic and atraumatic Mouth: no erythema or exudates, MMM Eyes: PERRL, EOMI, conjunctivae normal, No scleral icterus.  Cardiovascular: RRR, S1 normal, S2 normal, no MRG, pulses symmetric and intact bilaterally Pulmonary/Chest: CTAB, no wheezes, rales, or rhonchi Abdominal: Soft. Non-tender, non-distended, bowel sounds are normal, no masses, organomegaly, or guarding present.  Musculoskeletal: No joint deformities, erythema, or stiffness, ROM full and nontender Neurological: A&O x3, Strength is normal and symmetric bilaterally,  cranial nerve II-XII are grossly intact, no focal motor deficit, sensory intact to light touch bilaterally.  Skin: Warm, dry and intact. No rash, cyanosis, or clubbing.  Psychiatric: Normal mood and affect. speech and behavior is normal. Judgment and thought content normal. Cognition and memory are normal.   Assessment & Plan:  Case and care discussed with Dr. Eben Burow Please see problem oriented charting for further details. Patient to return in 2 months to follow up on depression and DM.

## 2012-12-21 DIAGNOSIS — M791 Myalgia, unspecified site: Secondary | ICD-10-CM | POA: Insufficient documentation

## 2012-12-21 LAB — SEDIMENTATION RATE: Sed Rate: 22 mm/hr (ref 0–22)

## 2012-12-21 NOTE — Assessment & Plan Note (Signed)
BP Readings from Last 3 Encounters:  12/20/12 127/65  11/21/12 102/61  11/09/12 132/73    Lab Results  Component Value Date   NA 140 11/09/2012   K 4.2 11/09/2012   CREATININE 1.75* 11/09/2012    Assessment:  Blood pressure control: controlled  Progress toward BP goal:  at goal  Plan:  Medications:  continue current medications - amlodipine 5, imdur 60, hyzaar 100-25, toprol XL 25  Educational resources provided: brochure;video  Self management tools provided: home blood pressure meter

## 2012-12-21 NOTE — Assessment & Plan Note (Addendum)
Patient is severely depressed though without SI/HI. She does not have good support from her husband, and has had increased stressors as her daughter has moved back in and is without money/work.  She has been stable on wellbutrin and celexa for years, but for some reason she can no longer see a counselor/therapist at [facility], she says that it is "just not an option" since [facility] has been restructured. I suspect she may also have a component of SAD.  I suspect that she would do well with therapy, and that is ideal. I will refer her to Lynnae January, CSW to investigate other options for mental health that may be covered with her insurance.  If nothing is available, we may need to change medications in our office - would consider weaning off of celexa and starting cymbalta.  Of note, TSH wnl.  We spent >30 minutes discussing depression

## 2012-12-21 NOTE — Assessment & Plan Note (Addendum)
May be related to depression. ESR normal and CRP mildly elevated.  CK elevated as well, though in the setting of chronic renal failure and patient is african [place].  Will do trial of discontinuing simvastatin and monitor for resolution of symptoms (in fact, CK may not even be elevated in patients that experience statin induced myalgias). Inflammatory markers less suggestive of rheumatic disease, but if symptoms persist, may need to pursue further work up.  TSH wnl.

## 2012-12-21 NOTE — Assessment & Plan Note (Signed)
Given myalgias, will discontinue simvastatin and monitor for improvement of symptoms.

## 2012-12-28 ENCOUNTER — Telehealth: Payer: Self-pay | Admitting: Licensed Clinical Social Worker

## 2012-12-28 NOTE — Telephone Encounter (Signed)
[name] was referred to CSW for referrals for new psychiatrist.  Pt is currently being seen at [facility].  Pt would also benefit from [facility] to assist with supporting diabetes management.  CSW placed called to pt.  CSW left message requesting return call. CSW provided contact hours and phone number.

## 2013-01-01 NOTE — Telephone Encounter (Signed)
CSW placed called to pt.  CSW left message requesting return call. CSW provided contact hours and phone number. 

## 2013-01-02 ENCOUNTER — Telehealth (HOSPITAL_COMMUNITY): Payer: Self-pay

## 2013-01-02 NOTE — Telephone Encounter (Signed)
Pt in agreement for CSW to utilize [facility].  Pt would like to establish with psychiatrist and therapist.  CSW was able to schedule appt with Dr. Donell Beers on 03/10/13 at 2:15pm.  Pt notified and letter mailed.  CSW will provide brochure for [facility] for pt to decide if she would like referral.

## 2013-01-02 NOTE — Telephone Encounter (Signed)
01/02/13 4:37PM CALLED AND LEFT MSG ON CELL# AND CALLED THE HOME # S/W PT INFORMED HER THAT WE NEED MEDICAL RECORDS FROM [facility] FAXED OVER TO [place] - GAVE FAX#./SH

## 2013-01-04 ENCOUNTER — Ambulatory Visit (INDEPENDENT_AMBULATORY_CARE_PROVIDER_SITE_OTHER): Payer: PRIVATE HEALTH INSURANCE | Admitting: Internal Medicine

## 2013-01-04 ENCOUNTER — Telehealth: Payer: Self-pay | Admitting: *Deleted

## 2013-01-04 ENCOUNTER — Encounter: Payer: Self-pay | Admitting: Internal Medicine

## 2013-01-04 VITALS — BP 112/63 | HR 80 | Temp 98.8°F | Ht 63.6 in | Wt 208.0 lb

## 2013-01-04 DIAGNOSIS — M1990 Unspecified osteoarthritis, unspecified site: Secondary | ICD-10-CM

## 2013-01-04 DIAGNOSIS — G8929 Other chronic pain: Secondary | ICD-10-CM

## 2013-01-04 DIAGNOSIS — I10 Essential (primary) hypertension: Secondary | ICD-10-CM

## 2013-01-04 DIAGNOSIS — M479 Spondylosis, unspecified: Secondary | ICD-10-CM

## 2013-01-04 DIAGNOSIS — F329 Major depressive disorder, single episode, unspecified: Secondary | ICD-10-CM

## 2013-01-04 DIAGNOSIS — Z794 Long term (current) use of insulin: Secondary | ICD-10-CM

## 2013-01-04 DIAGNOSIS — R079 Chest pain, unspecified: Secondary | ICD-10-CM

## 2013-01-04 DIAGNOSIS — Z13820 Encounter for screening for osteoporosis: Secondary | ICD-10-CM

## 2013-01-04 LAB — GLUCOSE, CAPILLARY
Glucose-Capillary: 52 mg/dL — ABNORMAL LOW (ref 70–99)
Glucose-Capillary: 107 mg/dL — ABNORMAL HIGH (ref 70–99)

## 2013-01-04 MED ORDER — FLUOXETINE HCL 20 MG PO CAPS: 20.0000 mg | ORAL_CAPSULE | Freq: Every morning | ORAL | Status: DC

## 2013-01-04 NOTE — Telephone Encounter (Signed)
Agree with appt. 

## 2013-01-04 NOTE — Patient Instructions (Addendum)
Stop taking cymbalta and start taking prozac daily  Follow up with behavioral health  Start taking calcium and vitamin d  Will repeat dexa scan and call you to schedule  Follow up with your pcp  If your depression gets worse or you suicidial or homicidal thoughts, please call the clinic right away [phone] or go directly to ED

## 2013-01-04 NOTE — Telephone Encounter (Signed)
Pt called with c/o pain to chest area. Mid sternal with no radiation. On and off, onset since January. She has had GI referral and Cardiac test with negative results.  She takes vicodin for this pain, pain is relieved.  Rest also seems to help with the pain.  She has not felt good all week.  Pt also seeing orthopedic for wrist pain.  Waiting for nerve conduction study. Pt denies SOB, nausea, diaphoresis.  Will see this afternoon

## 2013-01-04 NOTE — Progress Notes (Signed)
1515PM - CBG 52- OJ/ 2 glucose tablets (pt's request) given. 1540PM -  CBG up to 107.

## 2013-01-05 ENCOUNTER — Other Ambulatory Visit: Payer: Self-pay | Admitting: Internal Medicine

## 2013-01-07 NOTE — Assessment & Plan Note (Signed)
Lab Results  Component Value Date   HGBA1C 7.8 11/08/2012   HGBA1C 6.9 08/16/2012   HGBA1C 10.7 05/24/2012    Assessment:  Diabetes control: fair control  Progress toward A1C goal:  deteriorated  Plan:  Medications: continue current regimen: novolog 10 units TID, lantus 50 units qhs  Home glucose monitoring:   Frequency: 3 times a day   Timing: before meals  Instruction/counseling given: continue regimen, counseling given on diet modifications  Educational resources provided: brochure  Self management tools provided: copy of home glucose meter download  Other plans: recheck a1c April 2014

## 2013-01-08 NOTE — Progress Notes (Signed)
Subjective:   Patient ID: [name] female   DOB: May 06, 1951 62 y.o.   MRN: [id number]  HPI: [name] is a 62 y.o. african american obese female with PMH of HTN, DM2, chronic pain, and depression presenting to clinic today for complaints of chronic substernal chest pain.  She has had extensive cardiac work up in the past that has been negative and claims the pain has been worse for the past month, non-radiating, located between her breasts, and constant.  She also has diffuse body aches and pain and is frustrated with always being in pain.  She also has depression that she does not feel is well controlled at this time but denies any suicidal or homicidal ideation.  She just feels annoyed with the pain at all times and tends to take that out on her family members, especially her husband.    She has been recommended for outpatient repeat stress test that she does not get because she says it has been negative in the past and she does not believe this is cardiac.   Past Medical History  Diagnosis Date  . Hypertension   . Peripheral neuropathy   . Cervicalgia   . Low back pain syndrome   . Hyperlipidemia   . Vitamin D deficiency   . Vitamin B12 deficiency   . Iliotibial band syndrome   . Anemia   . Borderline personality disorder   . Nonorganic psychosis   . GERD (gastroesophageal reflux disease)   . Diabetes mellitus   . Depression   . Diabetic gastroparesis   . Sleep apnea   . CAD (coronary artery disease)     Catherization 11/18/09>nonobstructive CAD , normal LV function, EF 65%  . Health maintenance examination     Colonoscopy 2009> normal; Diabetic eye exam 12/2008. No diabetic retinopathy; Mammogram 11/10> No evidence of malignancy   Current Outpatient Prescriptions  Medication Sig Dispense Refill  . ACCU-CHEK FASTCLIX LANCETS MISC 1 each by Does not apply route 3 (three) times daily before meals. Dx code 250.00  102 each  12  . amLODipine (NORVASC) 5 MG tablet Take 1  tablet (5 mg total) by mouth daily.  90 tablet  1  . aspirin 81 MG tablet Take 81 mg by mouth daily.        Marland Kitchen buPROPion (WELLBUTRIN XL) 300 MG 24 hr tablet Take 300 mg by mouth daily.        Marland Kitchen dexlansoprazole (DEXILANT) 60 MG capsule Take 60 mg by mouth daily.        . diclofenac sodium (VOLTAREN) 1 % GEL Apply 2 g topically 3 (three) times daily as needed.  1 Tube  0  . fluticasone (FLONASE) 50 MCG/ACT nasal spray Place 2 sprays into the nose daily.  16 g  2  . gabapentin (NEURONTIN) 800 MG tablet Take 1 tablet (800 mg total) by mouth 3 (three) times daily. Take one tablet by mouth in am and 2 tablets by mouth in pm  90 tablet  11  . glucose blood (ACCU-CHEK SMARTVIEW) test strip Use to check blood sugar before meals dx code 250.00  100 each  12  . HYDROcodone-acetaminophen (NORCO) 10-325 MG per tablet Take 1 tablet by mouth every 6 (six) hours as needed for pain.  60 tablet  0  . insulin aspart (NOVOLOG) 100 UNIT/ML injection Inject 10 Units into the skin 3 (three) times daily before meals.  10 mL  3  . insulin glargine (LANTUS SOLOSTAR) 100 UNIT/ML  injection Inject 50 Units into the skin at bedtime.  3 pen  12  . Insulin Pen Needle 31G X 5 MM MISC Box of 100 - uses up to 5 times per day according to CBG  100 each  11  . isosorbide mononitrate (IMDUR) 60 MG 24 hr tablet Take 1 tablet (60 mg total) by mouth daily.  30 tablet  6  . losartan-hydrochlorothiazide (HYZAAR) 100-25 MG per tablet Take 1 tablet by mouth daily.  30 tablet  5  . metoprolol succinate (TOPROL-XL) 25 MG 24 hr tablet Take 1 tablet (25 mg total) by mouth daily.  30 tablet  6  . senna (SENOKOT) 8.6 MG tablet Take 4 tablets by mouth daily.        Marland Kitchen FLUoxetine (PROZAC) 20 MG capsule Take 1 capsule (20 mg total) by mouth every morning.  30 capsule  3  . simvastatin (ZOCOR) 20 MG tablet Take 1 tablet (20 mg total) by mouth at bedtime.  90 tablet  3   No current facility-administered medications for this visit.   Family History   Problem Relation Age of Onset  . Diabetes Mother   . Hypertension Mother   . Hyperlipidemia Mother   . Arthritis Mother   . Heart disease Father   . Diabetes Sister   . Diabetes Brother    History   Social History  . Marital Status: Married    Spouse Name: N/A    Number of Children: N/A  . Years of Education: N/A   Social History Main Topics  . Smoking status: Former Smoker    Types: Cigarettes    Quit date: 10/25/2002  . Smokeless tobacco: None  . Alcohol Use: No  . Drug Use: No  . Sexually Active: None   Other Topics Concern  . None   Social History Narrative  . None   Review of Systems: Constitutional: Denies fever, chills, diaphoresis, appetite change and fatigue.  HEENT: Denies photophobia, eye pain, redness, hearing loss, ear pain, congestion, sore throat, rhinorrhea, sneezing, mouth sores, trouble swallowing, neck pain, neck stiffness and tinnitus.   Respiratory: Denies SOB, DOE, cough, chest tightness,  and wheezing.   Cardiovascular: chest pain.  Denies palpitations and leg swelling.  Gastrointestinal: Denies nausea, vomiting, abdominal pain, diarrhea, constipation, blood in stool and abdominal distention.  Genitourinary: Denies dysuria, urgency, frequency, hematuria, flank pain and difficulty urinating.  Musculoskeletal: diffuse body pain.  Denies myalgias, back pain, joint swelling, and gait problem.  Skin: Denies pallor, rash and wound.  Neurological: Denies dizziness, seizures, syncope, weakness, light-headedness, numbness and headaches.  Hematological: Denies adenopathy. Easy bruising, personal or family bleeding history  Psychiatric/Behavioral: Depression, sleep disturbance, and agitation.  Denies suicidal ideation, confusion, nervousness.    Objective:  Physical Exam: Filed Vitals:   01/04/13 1510  BP: 112/63  Pulse: 80  Temp: 98.8 F (37.1 C)  TempSrc: Oral  Height: 5' 3.6" (1.615 m)  Weight: 208 lb (94.348 kg)  SpO2: 97%   Constitutional:  Vital signs reviewed.  Patient is a well-developed and well-nourished female in no acute distress and cooperative with exam. Alert and oriented x3.  Head: Normocephalic and atraumatic Ear: TM normal bilaterally Mouth: no erythema or exudates, MMM Eyes: PERRL, EOMI, conjunctivae normal, No scleral icterus.  Neck: Supple, Trachea midline normal ROM Cardiovascular: RRR, S1 normal, S2 normal, no MRG, pulses symmetric and intact bilaterally.  Chest pain with point tenderness between breasts, non-radiating.   Pulmonary/Chest: CTAB, no wheezes, rales, or rhonchi Abdominal: Soft. Non-tender,  non-distended, bowel sounds are normal, no masses, organomegaly, or guarding present.  GU: no CVA tenderness Musculoskeletal: No joint deformities, erythema, or stiffness, ROM full.  Diffuse body tenderness/soreness but point tenderness limited on pressure point examination due to generalized body aches.     Hematology: no cervical, inginal, or axillary adenopathy.  Neurological: A&O x3, Strength is normal and symmetric bilaterally, cranial nerve II-XII are grossly intact, no focal motor deficit, sensory intact to light touch bilaterally.  Skin: Warm, dry and intact. No rash, cyanosis, or clubbing.  Psychiatric: Depression. Speech and behavior is normal. Judgment and thought content normal. Cognition and memory are normal. Denies suicidal or homicidal ideation.    Assessment & Plan:  Discussed with Dr. Josem Kaufmann -depression: discontinued celexa and switched to prozac.  Monitor for any improvement.

## 2013-01-08 NOTE — Assessment & Plan Note (Signed)
BP Readings from Last 3 Encounters:  01/04/13 112/63  12/20/12 127/65  11/21/12 102/61   Lab Results  Component Value Date   NA 140 11/09/2012   K 4.2 11/09/2012   CREATININE 1.75* 11/09/2012   Assessment:  Blood pressure control: controlled  Progress toward BP goal:  at goal  Plan:  Medications: continue current medications: norvasc 5mg , imdur 60mg , hyzaar 100-25mg , toprol-xl 25mg  daily  Educational resources provided: brochure  Self management tools provided:

## 2013-01-08 NOTE — Assessment & Plan Note (Signed)
Appears uncontrolled but denies any suicidal or homicidal ideation.  Exacerbated with chronic pain complaints.   -discontinue celexa and start prozac.  Monitor for improvement -pain control -consider TCA if no improvement-may help more with pain as well

## 2013-01-08 NOTE — Assessment & Plan Note (Signed)
Chronic complaints.  Coincides with depression.  Has has extensive negative cardiac work up in the past.  Was supposed to get follow up stress test as an outpatient but she has not gotten it done because she has had negative study in the past and is convinced it is not cardiac.  She describes the pain to be sharp, constant, between her breasts, non-radiating, there for the past few months.  Also complains of diffuse body pain, frustration, depressed mood, trouble sleeping, and anger towards family members especially husband who she claims does not understand what she is going through.    Pain appears multi-factorial and possibly costochondritis.  However, consideration may also be given to uncontrolled depression and ?fibromyalgia.  Although pressure point examination was negative with less than 11/18, it may be inaccurate in the setting of chronic pain and "soreness" complaints.  It appears it will be important to try to relieve her pain and depression as much as possible given that both likely contribute to each other and is hard to know which is the illiciting factor.  Per BMJ article and podcast 2014 and up to date fibromyalgia seems to be described my chronic widespread pain plus sleep disturbance.  However difficult to diagnose, especially in her case, thus remains questionable.    -will adjust depression medications at this time: discontinue celexa and start prozac.   -f/u with appointment with behavioral health as she does not wish to go to [facility] anymore -if no improvement with SSRI, consider TCA such as amitriptyline which may assist with possible fibromyalgia as well.   -recommended to follow up with stress test per cardiology recommendations

## 2013-01-08 NOTE — Assessment & Plan Note (Signed)
Repeat dexa scan °

## 2013-01-09 ENCOUNTER — Encounter (HOSPITAL_COMMUNITY): Payer: Self-pay | Admitting: *Deleted

## 2013-01-09 ENCOUNTER — Inpatient Hospital Stay (HOSPITAL_COMMUNITY): Payer: Medicare Other

## 2013-01-09 ENCOUNTER — Inpatient Hospital Stay (HOSPITAL_COMMUNITY)
Admission: EM | Admit: 2013-01-09 | Discharge: 2013-01-17 | DRG: 330 | Disposition: A | Discharge status: Home / Self Care | Payer: Medicare Other | Attending: General Surgery | Admitting: General Surgery

## 2013-01-09 ENCOUNTER — Emergency Department (HOSPITAL_COMMUNITY): Payer: Medicare Other

## 2013-01-09 DIAGNOSIS — F411 Generalized anxiety disorder: Secondary | ICD-10-CM | POA: Diagnosis present

## 2013-01-09 DIAGNOSIS — F3289 Other specified depressive episodes: Secondary | ICD-10-CM | POA: Diagnosis present

## 2013-01-09 DIAGNOSIS — M479 Spondylosis, unspecified: Secondary | ICD-10-CM | POA: Diagnosis present

## 2013-01-09 DIAGNOSIS — K3184 Gastroparesis: Secondary | ICD-10-CM | POA: Diagnosis present

## 2013-01-09 DIAGNOSIS — I10 Essential (primary) hypertension: Secondary | ICD-10-CM

## 2013-01-09 DIAGNOSIS — E1149 Type 2 diabetes mellitus with other diabetic neurological complication: Secondary | ICD-10-CM | POA: Diagnosis present

## 2013-01-09 DIAGNOSIS — Z794 Long term (current) use of insulin: Secondary | ICD-10-CM

## 2013-01-09 DIAGNOSIS — K565 Intestinal adhesions [bands], unspecified as to partial versus complete obstruction: Principal | ICD-10-CM | POA: Diagnosis present

## 2013-01-09 DIAGNOSIS — K929 Disease of digestive system, unspecified: Secondary | ICD-10-CM | POA: Diagnosis not present

## 2013-01-09 DIAGNOSIS — E669 Obesity, unspecified: Secondary | ICD-10-CM | POA: Diagnosis present

## 2013-01-09 DIAGNOSIS — G44229 Chronic tension-type headache, not intractable: Secondary | ICD-10-CM | POA: Diagnosis present

## 2013-01-09 DIAGNOSIS — G5600 Carpal tunnel syndrome, unspecified upper limb: Secondary | ICD-10-CM | POA: Diagnosis present

## 2013-01-09 DIAGNOSIS — K560 Paralytic ileus: Secondary | ICD-10-CM | POA: Diagnosis not present

## 2013-01-09 DIAGNOSIS — D509 Iron deficiency anemia, unspecified: Secondary | ICD-10-CM | POA: Diagnosis present

## 2013-01-09 DIAGNOSIS — E538 Deficiency of other specified B group vitamins: Secondary | ICD-10-CM | POA: Diagnosis present

## 2013-01-09 DIAGNOSIS — N189 Chronic kidney disease, unspecified: Secondary | ICD-10-CM | POA: Diagnosis present

## 2013-01-09 DIAGNOSIS — K219 Gastro-esophageal reflux disease without esophagitis: Secondary | ICD-10-CM | POA: Diagnosis present

## 2013-01-09 DIAGNOSIS — E559 Vitamin D deficiency, unspecified: Secondary | ICD-10-CM | POA: Diagnosis present

## 2013-01-09 DIAGNOSIS — R079 Chest pain, unspecified: Secondary | ICD-10-CM | POA: Diagnosis present

## 2013-01-09 DIAGNOSIS — Y836 Removal of other organ (partial) (total) as the cause of abnormal reaction of the patient, or of later complication, without mention of misadventure at the time of the procedure: Secondary | ICD-10-CM | POA: Diagnosis not present

## 2013-01-09 DIAGNOSIS — K56609 Unspecified intestinal obstruction, unspecified as to partial versus complete obstruction: Secondary | ICD-10-CM

## 2013-01-09 DIAGNOSIS — E119 Type 2 diabetes mellitus without complications: Secondary | ICD-10-CM

## 2013-01-09 DIAGNOSIS — I2510 Atherosclerotic heart disease of native coronary artery without angina pectoris: Secondary | ICD-10-CM | POA: Diagnosis present

## 2013-01-09 DIAGNOSIS — I129 Hypertensive chronic kidney disease with stage 1 through stage 4 chronic kidney disease, or unspecified chronic kidney disease: Secondary | ICD-10-CM | POA: Diagnosis present

## 2013-01-09 DIAGNOSIS — F603 Borderline personality disorder: Secondary | ICD-10-CM | POA: Diagnosis present

## 2013-01-09 DIAGNOSIS — E785 Hyperlipidemia, unspecified: Secondary | ICD-10-CM | POA: Diagnosis present

## 2013-01-09 DIAGNOSIS — F329 Major depressive disorder, single episode, unspecified: Secondary | ICD-10-CM | POA: Diagnosis present

## 2013-01-09 DIAGNOSIS — G609 Hereditary and idiopathic neuropathy, unspecified: Secondary | ICD-10-CM | POA: Diagnosis present

## 2013-01-09 HISTORY — DX: Unspecified osteoarthritis, unspecified site: M19.90

## 2013-01-09 HISTORY — DX: Headache: R51

## 2013-01-09 HISTORY — DX: Unspecified intestinal obstruction, unspecified as to partial versus complete obstruction: K56.609

## 2013-01-09 LAB — COMPREHENSIVE METABOLIC PANEL
ALT: 16 U/L (ref 0–35)
CO2: 22 mEq/L (ref 19–32)
Calcium: 10.1 mg/dL (ref 8.4–10.5)
Creatinine, Ser: 1.28 mg/dL — ABNORMAL HIGH (ref 0.50–1.10)
GFR calc Af Amer: 51 mL/min — ABNORMAL LOW (ref >90)
GFR calc non Af Amer: 44 mL/min — ABNORMAL LOW (ref >90)
Glucose, Bld: 168 mg/dL — ABNORMAL HIGH (ref 70–99)
Sodium: 139 mEq/L (ref 135–145)

## 2013-01-09 LAB — CBC WITH DIFFERENTIAL/PLATELET
Eosinophils Relative: 1 % (ref 0–5)
HCT: 38.8 % (ref 36.0–46.0)
Hemoglobin: 13.1 g/dL (ref 12.0–15.0)
Lymphocytes Relative: 23 % (ref 12–46)
Lymphs Abs: 2 10*3/uL (ref 0.7–4.0)
MCV: 77.1 fL — ABNORMAL LOW (ref 78.0–100.0)
Monocytes Absolute: 0.4 10*3/uL (ref 0.1–1.0)
RBC: 5.03 MIL/uL (ref 3.87–5.11)
WBC: 9 10*3/uL (ref 4.0–10.5)

## 2013-01-09 LAB — CBC
HCT: 38.8 % (ref 36.0–46.0)
Hemoglobin: 12.9 g/dL (ref 12.0–15.0)
MCH: 26.3 pg (ref 26.0–34.0)
MCHC: 33.2 g/dL (ref 30.0–36.0)
RBC: 4.91 MIL/uL (ref 3.87–5.11)

## 2013-01-09 LAB — CREATININE, SERUM: Creatinine, Ser: 1.39 mg/dL — ABNORMAL HIGH (ref 0.50–1.10)

## 2013-01-09 LAB — GLUCOSE, CAPILLARY: Glucose-Capillary: 158 mg/dL — ABNORMAL HIGH (ref 70–99)

## 2013-01-09 MED ORDER — HYDROMORPHONE HCL PF 1 MG/ML IJ SOLN
0.5000 mg | INTRAMUSCULAR | Status: DC | PRN
  Administered 2013-01-09 (×2): 2 mg via INTRAVENOUS
  Administered 2013-01-10: 1 mg via INTRAVENOUS
  Administered 2013-01-10 (×3): 2 mg via INTRAVENOUS
  Administered 2013-01-10: 1 mg via INTRAVENOUS
  Administered 2013-01-10 – 2013-01-11 (×4): 2 mg via INTRAVENOUS
  Filled 2013-01-09: qty 2
  Filled 2013-01-09: qty 1
  Filled 2013-01-09 (×8): qty 2
  Filled 2013-01-09: qty 1

## 2013-01-09 MED ORDER — ONDANSETRON HCL 4 MG/2ML IJ SOLN
4.0000 mg | Freq: Once | INTRAMUSCULAR | Status: AC
  Administered 2013-01-09: 4 mg via INTRAVENOUS
  Filled 2013-01-09: qty 2

## 2013-01-09 MED ORDER — HYDROMORPHONE HCL PF 1 MG/ML IJ SOLN
0.5000 mg | Freq: Once | INTRAMUSCULAR | Status: AC
  Administered 2013-01-09: 0.5 mg via INTRAVENOUS
  Filled 2013-01-09: qty 1

## 2013-01-09 MED ORDER — ONDANSETRON HCL 4 MG/2ML IJ SOLN
INTRAMUSCULAR | Status: AC
  Filled 2013-01-09: qty 2

## 2013-01-09 MED ORDER — METOPROLOL TARTRATE 1 MG/ML IV SOLN
5.0000 mg | Freq: Four times a day (QID) | INTRAVENOUS | Status: DC
  Administered 2013-01-09: 5 mg via INTRAVENOUS
  Filled 2013-01-09 (×3): qty 5

## 2013-01-09 MED ORDER — POTASSIUM CHLORIDE IN NACL 20-0.9 MEQ/L-% IV SOLN
INTRAVENOUS | Status: DC
  Administered 2013-01-09 – 2013-01-10 (×2): via INTRAVENOUS
  Filled 2013-01-09 (×3): qty 1000

## 2013-01-09 MED ORDER — IOHEXOL 300 MG/ML  SOLN
100.0000 mL | Freq: Once | INTRAMUSCULAR | Status: AC | PRN
  Administered 2013-01-09: 100 mL via INTRAVENOUS

## 2013-01-09 MED ORDER — HYDROMORPHONE HCL PF 1 MG/ML IJ SOLN
1.0000 mg | Freq: Once | INTRAMUSCULAR | Status: AC
  Administered 2013-01-09: 1 mg via INTRAVENOUS
  Filled 2013-01-09: qty 1

## 2013-01-09 MED ORDER — SODIUM CHLORIDE 0.9 % IV BOLUS (SEPSIS)
500.0000 mL | Freq: Once | INTRAVENOUS | Status: AC
  Administered 2013-01-09: 500 mL via INTRAVENOUS

## 2013-01-09 MED ORDER — IOHEXOL 300 MG/ML  SOLN
25.0000 mL | INTRAMUSCULAR | Status: AC
  Administered 2013-01-09 (×2): 25 mL via ORAL

## 2013-01-09 MED ORDER — DIPHENHYDRAMINE HCL 12.5 MG/5ML PO ELIX
12.5000 mg | ORAL_SOLUTION | Freq: Four times a day (QID) | ORAL | Status: DC | PRN
  Filled 2013-01-09: qty 10

## 2013-01-09 MED ORDER — PANTOPRAZOLE SODIUM 40 MG IV SOLR
40.0000 mg | Freq: Once | INTRAVENOUS | Status: AC
  Administered 2013-01-09: 40 mg via INTRAVENOUS
  Filled 2013-01-09: qty 40

## 2013-01-09 MED ORDER — INSULIN ASPART 100 UNIT/ML ~~LOC~~ SOLN
0.0000 [IU] | Freq: Three times a day (TID) | SUBCUTANEOUS | Status: DC
Start: 2013-01-09 — End: 2013-01-12
  Administered 2013-01-09: 2 [IU] via SUBCUTANEOUS
  Administered 2013-01-10: 3 [IU] via SUBCUTANEOUS
  Administered 2013-01-10: 2 [IU] via SUBCUTANEOUS

## 2013-01-09 MED ORDER — DIPHENHYDRAMINE HCL 50 MG/ML IJ SOLN
12.5000 mg | Freq: Four times a day (QID) | INTRAMUSCULAR | Status: DC | PRN
  Administered 2013-01-10: 25 mg via INTRAVENOUS
  Administered 2013-01-12 (×2): 12.5 mg via INTRAVENOUS
  Administered 2013-01-16 (×2): 25 mg via INTRAVENOUS
  Filled 2013-01-09 (×6): qty 1

## 2013-01-09 MED ORDER — METOPROLOL TARTRATE 1 MG/ML IV SOLN
10.0000 mg | Freq: Four times a day (QID) | INTRAVENOUS | Status: DC
  Administered 2013-01-10 – 2013-01-11 (×4): 10 mg via INTRAVENOUS
  Administered 2013-01-11: 19 mg via INTRAVENOUS
  Administered 2013-01-11 – 2013-01-15 (×17): 10 mg via INTRAVENOUS
  Filled 2013-01-09 (×26): qty 10

## 2013-01-09 MED ORDER — HEPARIN SODIUM (PORCINE) 5000 UNIT/ML IJ SOLN
5000.0000 [IU] | Freq: Three times a day (TID) | INTRAMUSCULAR | Status: DC
  Administered 2013-01-09 – 2013-01-17 (×22): 5000 [IU] via SUBCUTANEOUS
  Filled 2013-01-09 (×26): qty 1

## 2013-01-09 MED ORDER — HYDROMORPHONE HCL PF 1 MG/ML IJ SOLN
INTRAMUSCULAR | Status: AC
  Filled 2013-01-09: qty 2

## 2013-01-09 MED ORDER — ONDANSETRON HCL 4 MG/2ML IJ SOLN
4.0000 mg | Freq: Four times a day (QID) | INTRAMUSCULAR | Status: DC | PRN
  Administered 2013-01-09: 4 mg via INTRAVENOUS
  Filled 2013-01-09: qty 2

## 2013-01-09 NOTE — ED Notes (Signed)
Called CT notified patient finished with first cup of contrast

## 2013-01-09 NOTE — ED Notes (Signed)
Pt developed severe abdominal pain, crying, diaphoretic. EDP made aware in to assess patient. Suction turned off to NG tube.

## 2013-01-09 NOTE — ED Notes (Signed)
Attempt to call report x 1  

## 2013-01-09 NOTE — ED Notes (Signed)
Surgical resident at bedside to see pt.

## 2013-01-09 NOTE — ED Notes (Signed)
Family at bedside. 

## 2013-01-09 NOTE — ED Notes (Signed)
NG advanced 6 inches.

## 2013-01-09 NOTE — ED Notes (Signed)
Pt is here for abdominal pain which she describes as "sharp" and has been associated with burping and nausea.  This began yesterday.  Pt denies any GU symptoms with this.  Pt had her LBM yesterday and she describes it as "normal".  No fever and chills with this.

## 2013-01-09 NOTE — ED Provider Notes (Signed)
Pt seen with resident She has +small bowel obstruction I spoke to surgery who will admit patient On re-exam, she has continued abdominal tenderness, but no rebound or guarding Spoke to surgery again, aware of continued pain.  NG tube in place with large amt of fluid suctioned She will move to inpatient bed and she will be reassessed and may need operative repair   Joya Gaskins, MD 01/09/13 1549

## 2013-01-09 NOTE — ED Notes (Signed)
Pt states mid lower abdominal pain that radiates to the epigastric area. Pt is burping states "tastes like bologna". C/o nausea.

## 2013-01-09 NOTE — ED Provider Notes (Signed)
History     CSN: [id number]  Arrival date & time 01/09/13  1045   First MD Initiated Contact with Patient 01/09/13 1053    Chief Complaint  Patient presents with  . Abdominal Pain   HPI 62 y/o F with PMHx significant for DM insulin dependent, Gastroparesis, CAD, HTN, HLD presents to ED with abdominal pain started last night. The pain is located on mid abdomen with no radiation. It is described as constant, sharp with intensity of 10/10. Pt has not tried any medication with intention to alleviate her pain. The associated symptoms are nausea started at 3:00 am but no vomiting. Her last BM was yesterday and described as normal consistency, no blood or mucus. Pr reports not passing gas since then. Pt denies fevers, chills, urinary symptoms, SOB or chest pain.  Past Medical History  Diagnosis Date  . Hypertension   . Peripheral neuropathy   . Cervicalgia   . Low back pain syndrome   . Hyperlipidemia   . Vitamin D deficiency   . Vitamin B12 deficiency   . Iliotibial band syndrome   . Anemia   . Borderline personality disorder   . Nonorganic psychosis   . GERD (gastroesophageal reflux disease)   . Diabetes mellitus   . Depression   . Diabetic gastroparesis   . Sleep apnea   . CAD (coronary artery disease)     Catherization 11/18/09>nonobstructive CAD , normal LV function, EF 65%  . Health maintenance examination     Colonoscopy 2009> normal; Diabetic eye exam 12/2008. No diabetic retinopathy; Mammogram 11/10> No evidence of malignancy    Past Surgical History  Procedure Laterality Date  . Abdominal hysterectomy    . Hand surgery      Family History  Problem Relation Age of Onset  . Diabetes Mother   . Hypertension Mother   . Hyperlipidemia Mother   . Arthritis Mother   . Heart disease Father   . Diabetes Sister   . Diabetes Brother     History  Substance Use Topics  . Smoking status: Former Smoker    Types: Cigarettes    Quit date: 10/25/2002  . Smokeless tobacco:  Not on file  . Alcohol Use: No    Review of Systems  Constitutional: Negative.   HENT: Negative.   Respiratory: Negative.   Cardiovascular: Negative.   Gastrointestinal: Positive for nausea and abdominal pain.  Endocrine: Negative.   Genitourinary: Negative.   Musculoskeletal: Negative.   Skin: Negative.   Neurological: Negative.   Psychiatric/Behavioral: Negative.   All systems reviewed.   Allergies  Indomethacin; Penicillins; Cimetidine; and Sulfonamide derivatives  Home Medications   Current Outpatient Rx  Name  Route  Sig  Dispense  Refill  . ACCU-CHEK FASTCLIX LANCETS MISC   Does not apply   1 each by Does not apply route 3 (three) times daily before meals. Dx code 250.00   102 each   12   . amLODipine (NORVASC) 5 MG tablet   Oral   Take 1 tablet (5 mg total) by mouth daily.   90 tablet   1   . aspirin 81 MG tablet   Oral   Take 81 mg by mouth daily.           Marland Kitchen buPROPion (WELLBUTRIN XL) 300 MG 24 hr tablet   Oral   Take 300 mg by mouth daily.           Marland Kitchen dexlansoprazole (DEXILANT) 60 MG capsule   Oral  Take 60 mg by mouth daily.           . diclofenac sodium (VOLTAREN) 1 % GEL   Topical   Apply 2 g topically 3 (three) times daily as needed.   1 Tube   0   . FLUoxetine (PROZAC) 20 MG capsule   Oral   Take 1 capsule (20 mg total) by mouth every morning.   30 capsule   3   . fluticasone (FLONASE) 50 MCG/ACT nasal spray   Nasal   Place 2 sprays into the nose daily.   16 g   2   . gabapentin (NEURONTIN) 800 MG tablet   Oral   Take 1 tablet (800 mg total) by mouth 3 (three) times daily. Take one tablet by mouth in am and 2 tablets by mouth in pm   90 tablet   11   . glucose blood (ACCU-CHEK SMARTVIEW) test strip      Use to check blood sugar before meals dx code 250.00   100 each   12   . HYDROcodone-acetaminophen (NORCO) 10-325 MG per tablet   Oral   Take 1 tablet by mouth every 6 (six) hours as needed for pain.   60 tablet    0   . insulin aspart (NOVOLOG) 100 UNIT/ML injection   Subcutaneous   Inject 10 Units into the skin 3 (three) times daily before meals.   10 mL   3   . insulin glargine (LANTUS SOLOSTAR) 100 UNIT/ML injection   Subcutaneous   Inject 50 Units into the skin at bedtime.   3 pen   12   . Insulin Pen Needle 31G X 5 MM MISC      Box of 100 - uses up to 5 times per day according to CBG   100 each   11   . isosorbide mononitrate (IMDUR) 60 MG 24 hr tablet   Oral   Take 1 tablet (60 mg total) by mouth daily.   30 tablet   6   . losartan-hydrochlorothiazide (HYZAAR) 100-25 MG per tablet   Oral   Take 1 tablet by mouth daily.   30 tablet   5   . metoprolol succinate (TOPROL-XL) 25 MG 24 hr tablet   Oral   Take 1 tablet (25 mg total) by mouth daily.   30 tablet   6   . senna (SENOKOT) 8.6 MG tablet   Oral   Take 4 tablets by mouth daily.           . simvastatin (ZOCOR) 20 MG tablet   Oral   Take 1 tablet (20 mg total) by mouth at bedtime.   90 tablet   3     There were no vitals taken for this visit.  Physical Exam  Gen:  Moderated distress due to pain.  HEENT: Moist mucous membranes. Neck supple. No JVD. No adenopathies.  CV: Regular rate and rhythm, no murmurs rubs or gallops PULM: Clear to auscultation bilaterally. No wheezes/rales/rhonchi ABD: Obese, mildly distended, tender to palpation on mesogastrium with guarding, no rebound tenderness. Diminished bowel sounds. EXT: No edema MSK: No joint erythema or effusion.  Neuro: Alert and oriented x3. No focalization  Psych: normal affect and mood. No agitation.  ED Course  Procedures (including critical care time)  Labs Reviewed  CBC WITH DIFFERENTIAL - Abnormal; Notable for the following:    MCV 77.1 (*)    All other components within normal limits  COMPREHENSIVE METABOLIC PANEL - Abnormal;  Notable for the following:    Glucose, Bld 168 (*)    BUN 24 (*)    Creatinine, Ser 1.28 (*)    Total Protein 8.4  (*)    GFR calc non Af Amer 44 (*)    GFR calc Af Amer 51 (*)    All other components within normal limits  LIPASE, BLOOD   Ct Abdomen Pelvis W Contrast  01/09/2013  *RADIOLOGY REPORT*  Clinical Data: Mid/lower abdominal pain  CT ABDOMEN AND PELVIS WITH CONTRAST  Technique:  Multidetector CT imaging of the abdomen and pelvis was performed following the standard protocol during bolus administration of intravenous contrast.  Contrast: OMNIPAQUE IOHEXOL 300 MG/ML  SOLN  Comparison: None.  Findings: Atelectasis in the lingula and bilateral lower lobes.  Coronary atherosclerosis (series 2/image 3).  Liver, spleen, pancreas, and adrenal glands within normal limits.  Gallbladder is unremarkable.  No intrahepatic or extrahepatic ductal dilatation.  Kidneys within normal limits.  No hydronephrosis.  Dilated loops of proximal small bowel, with abrupt transition in the mid/lower abdomen anteriorly (coronal image 37). Distal small bowel and colon are relatively decompressed.  This appearance is compatible with high-grade partial or complete small bowel obstruction.  No pneumatosis or free air.  Trace pelvic ascites.  Atherosclerotic calcifications of the abdominal aorta and branch vessels.  No suspicious abdominopelvic lymphadenopathy.  Status post hysterectomy.  No adnexal masses.  Bladder is within normal limits.  Mild degenerative changes of the visualized thoracolumbar spine.  IMPRESSION: High grade partial or complete small bowel obstruction with abrupt transition in the mid/lower anterior abdomen.  No pneumatosis or free air.  Trace pelvic ascites.   Original Report Authenticated By: Charline Bills, M.D.     1. Small bowel obstruction     MDM  EKG  Date: 01/09/2013  Rate: 77 b/min  Rhythm: normal sinus rhythm  QRS Axis: normal  Intervals: normal  ST/T Wave abnormalities: normal  Conduction Disutrbances:none  Narrative Interpretation:   Old EKG Reviewed: unchanged  CT scan of abdomen showed  small bowel obstruction>> NG tube with low continuous suction and consult with General Surgery.    Dayarmys Piloto de Criselda Peaches, MD 01/09/13 1519  Joya Gaskins, MD 11/08/14 [phone]

## 2013-01-09 NOTE — H&P (Signed)
[name] 21-Sep-1951  [id number].   Primary Care MD: Dr. Everardo Beals Chief Complaint/Reason for Consult: SBO HPI: This is a 62 yo obese black female who had an acute onset of abdominal pain yesterday.  She had some nausea and dry heaving, but no emesis.  She had a BM and flatus yesterday, but none today.  No other symptoms.  Because of severe pain, she presented to [facility] for further evaluation.  She had a CT scan that revealed a high grade SBO.  We have been asked to see for further evaluation and admission.  Review of Systems: Please see HPI, otherwise all other systems right now are negative.  She has had some atypical chest pain recently and was supposed to get a stress test tomorrow.  Family History  Problem Relation Age of Onset  . Diabetes Mother   . Hypertension Mother   . Hyperlipidemia Mother   . Arthritis Mother   . Heart disease Father   . Diabetes Sister   . Diabetes Brother     Past Medical History  Diagnosis Date  . Hypertension   . Peripheral neuropathy   . Cervicalgia   . Low back pain syndrome   . Hyperlipidemia   . Vitamin D deficiency   . Vitamin B12 deficiency   . Iliotibial band syndrome   . Anemia   . Borderline personality disorder   . Nonorganic psychosis   . GERD (gastroesophageal reflux disease)   . Diabetes mellitus   . Depression   . Diabetic gastroparesis   . Sleep apnea   . CAD (coronary artery disease)     Catherization 11/18/09>nonobstructive CAD , normal LV function, EF 65%  . Health maintenance examination     Colonoscopy 2009> normal; Diabetic eye exam 12/2008. No diabetic retinopathy; Mammogram 11/10> No evidence of malignancy    Past Surgical History  Procedure Laterality Date  . Abdominal hysterectomy    . Hand surgery      Social History:  reports that she quit smoking about 10 years ago. Her smoking use included Cigarettes. She smoked 0.00 packs per day. She does not have any smokeless tobacco history on file. She reports that she  does not drink alcohol or use illicit drugs.  Allergies:  Allergies  Allergen Reactions  . Indomethacin Diarrhea    Caused severe diarrhea with episodes of incontinance  . Penicillins Hives  . Cimetidine     Breast swelling  . Sulfonamide Derivatives     unknown     (Not in a hospital admission)  Blood pressure 118/92, pulse 87, temperature 98 F (36.7 C), temperature source Oral, resp. rate 15, SpO2 96.00%. Physical Exam: General: pleasant, obese black female who is laying in bed in moderate distress secondary to pain HEENT: head is normocephalic, atraumatic.  Sclera are noninjected.  PERRL.  Ears and nose without any masses or lesions.  Mouth is pink and moist Heart: regular, rate, and rhythm.  Normal s1,s2. No obvious murmurs, gallops, or rubs noted.  Palpable radial and pedal pulses bilaterally Lungs: CTAB, no wheezes, rhonchi, or rales noted.  Respiratory effort nonlabored Abd: soft, but very distended and diffusely tender, but greatest in upper abdomen.  No definite peritoneal signs or rebound +BS, no masses, hernias, or organomegaly MS: all 4 extremities are symmetrical with no cyanosis, clubbing, or edema. Skin: warm and dry with no masses, lesions, or rashes Psych: A&Ox3 with an appropriate affect.    Results for orders placed during the hospital encounter of 01/09/13 (from  the past 48 hour(s))  CBC WITH DIFFERENTIAL     Status: Abnormal   Collection Time    01/09/13 11:07 AM      Result Value Range   WBC 9.0  4.0 - 10.5 K/uL   RBC 5.03  3.87 - 5.11 MIL/uL   Hemoglobin 13.1  12.0 - 15.0 g/dL   HCT 16.1  09.6 - 04.5 %   MCV 77.1 (*) 78.0 - 100.0 fL   MCH 26.0  26.0 - 34.0 pg   MCHC 33.8  30.0 - 36.0 g/dL   RDW 40.9  81.1 - 91.4 %   Platelets 273  150 - 400 K/uL   Neutrophils Relative 72  43 - 77 %   Neutro Abs 6.5  1.7 - 7.7 K/uL   Lymphocytes Relative 23  12 - 46 %   Lymphs Abs 2.0  0.7 - 4.0 K/uL   Monocytes Relative 4  3 - 12 %   Monocytes Absolute 0.4   0.1 - 1.0 K/uL   Eosinophils Relative 1  0 - 5 %   Eosinophils Absolute 0.1  0.0 - 0.7 K/uL   Basophils Relative 0  0 - 1 %   Basophils Absolute 0.0  0.0 - 0.1 K/uL  COMPREHENSIVE METABOLIC PANEL     Status: Abnormal   Collection Time    01/09/13 11:07 AM      Result Value Range   Sodium 139  135 - 145 mEq/L   Potassium 5.0  3.5 - 5.1 mEq/L   Chloride 104  96 - 112 mEq/L   CO2 22  19 - 32 mEq/L   Glucose, Bld 168 (*) 70 - 99 mg/dL   BUN 24 (*) 6 - 23 mg/dL   Creatinine, Ser 7.82 (*) 0.50 - 1.10 mg/dL   Calcium 95.6  8.4 - 21.3 mg/dL   Total Protein 8.4 (*) 6.0 - 8.3 g/dL   Albumin 3.9  3.5 - 5.2 g/dL   AST 18  0 - 37 U/L   ALT 16  0 - 35 U/L   Alkaline Phosphatase 91  39 - 117 U/L   Total Bilirubin 0.3  0.3 - 1.2 mg/dL   GFR calc non Af Amer 44 (*) >90 mL/min   GFR calc Af Amer 51 (*) >90 mL/min   Comment:            The eGFR has been calculated     using the CKD EPI equation.     This calculation has not been     validated in all clinical     situations.     eGFR's persistently     <90 mL/min signify     possible Chronic Kidney Disease.  LIPASE, BLOOD     Status: None   Collection Time    01/09/13 11:07 AM      Result Value Range   Lipase 50  11 - 59 U/L   Ct Abdomen Pelvis W Contrast  01/09/2013  *RADIOLOGY REPORT*  Clinical Data: Mid/lower abdominal pain  CT ABDOMEN AND PELVIS WITH CONTRAST  Technique:  Multidetector CT imaging of the abdomen and pelvis was performed following the standard protocol during bolus administration of intravenous contrast.  Contrast: OMNIPAQUE IOHEXOL 300 MG/ML  SOLN  Comparison: None.  Findings: Atelectasis in the lingula and bilateral lower lobes.  Coronary atherosclerosis (series 2/image 3).  Liver, spleen, pancreas, and adrenal glands within normal limits.  Gallbladder is unremarkable.  No intrahepatic or extrahepatic ductal dilatation.  Kidneys  within normal limits.  No hydronephrosis.  Dilated loops of proximal small bowel, with  abrupt transition in the mid/lower abdomen anteriorly (coronal image 37). Distal small bowel and colon are relatively decompressed.  This appearance is compatible with high-grade partial or complete small bowel obstruction.  No pneumatosis or free air.  Trace pelvic ascites.  Atherosclerotic calcifications of the abdominal aorta and branch vessels.  No suspicious abdominopelvic lymphadenopathy.  Status post hysterectomy.  No adnexal masses.  Bladder is within normal limits.  Mild degenerative changes of the visualized thoracolumbar spine.  IMPRESSION: High grade partial or complete small bowel obstruction with abrupt transition in the mid/lower anterior abdomen.  No pneumatosis or free air.  Trace pelvic ascites.   Original Report Authenticated By: Charline Bills, M.D.        Assessment/Plan 1. SBO Patient Active Problem List  Diagnosis  . Uncontrolled type 2 diabetes mellitus with insulin therapy  . VITAMIN D DEFICIENCY  . HYPERLIPIDEMIA  . PERIPHERAL NEUROPATHY  . HYPERTENSION  . CAD  . GERD  . SYMPTOM, APNEA, SLEEP NOS  . DEGENERATIVE JOINT DISEASE, BACK  . Chronic renal insufficiency  . Anxiety  . Depression  . Microcytic anemia  . Tension headache, chronic  . Carpal tunnel syndrome, bilateral  . Chronic chest pain  . Myalgia  . SBO (small bowel obstruction)   Plan: 1. We will admit the patient for NPO and NGT decompression.  She will get IVFs and repeat films in the am.  I have asked the EDP to consult IMTS to assist with medical management of DM and HTN, etc.  The patient is quite tender today.  If she does not improve rather quickly, she may likely need an operation tomorrow.  However, if she is improving we will continue conservative management.  This has been d/w the patient and her husband who both verbalize understanding and agreement with this plan.  OSBORNE,KELLY E 01/09/2013, 3:24 PM Pager: [phone]  She appears to have a SBO on history and imaging and on PE.  She  does have more tenderness than I would expect for a SBO, but she does not have peritonitis, leukocytosis, fever or tachycardia or any evidence of free air on CT.  I am concerned for her tenderness and "high grade bowel obstruction" on CT.  I will have a low threshhold for surgery.  I did discuss the option of NG tube decompression and bowel rest and abdominal exams vs. Immediate surgery with the pros and cons of each option with the patient.  She would like to trial a short course of bowel rest and NG tube decompression.  If she is not much improved in the morning, then we will plan for OR exploration.  Dr. Dwain Sarna, our on call surgeon tonight has also evaluated her for a baseline exam so if she deteriorates or becomes more tender he will have a good measure of her exam.

## 2013-01-09 NOTE — ED Notes (Signed)
Patient transported to X-ray 

## 2013-01-10 ENCOUNTER — Inpatient Hospital Stay (HOSPITAL_COMMUNITY): Payer: Medicare Other

## 2013-01-10 ENCOUNTER — Encounter (HOSPITAL_COMMUNITY): Admission: EM | Disposition: A | Discharge status: Home / Self Care | Payer: Self-pay | Source: Home / Self Care

## 2013-01-10 ENCOUNTER — Encounter (HOSPITAL_COMMUNITY): Payer: Self-pay | Admitting: Certified Registered"

## 2013-01-10 ENCOUNTER — Inpatient Hospital Stay (HOSPITAL_COMMUNITY): Payer: Medicare Other | Admitting: Certified Registered"

## 2013-01-10 ENCOUNTER — Encounter (HOSPITAL_COMMUNITY): Payer: PRIVATE HEALTH INSURANCE

## 2013-01-10 DIAGNOSIS — K565 Intestinal adhesions [bands], unspecified as to partial versus complete obstruction: Secondary | ICD-10-CM

## 2013-01-10 HISTORY — PX: LAPAROTOMY: SHX154

## 2013-01-10 HISTORY — PX: LYSIS OF ADHESION: SHX5961

## 2013-01-10 HISTORY — PX: BOWEL RESECTION: SHX1257

## 2013-01-10 LAB — CBC
HCT: 35.1 % — ABNORMAL LOW (ref 36.0–46.0)
Hemoglobin: 11.3 g/dL — ABNORMAL LOW (ref 12.0–15.0)
MCH: 25.9 pg — ABNORMAL LOW (ref 26.0–34.0)
MCHC: 32.2 g/dL (ref 30.0–36.0)
RDW: 15.5 % (ref 11.5–15.5)

## 2013-01-10 LAB — GLUCOSE, CAPILLARY
Glucose-Capillary: 143 mg/dL — ABNORMAL HIGH (ref 70–99)
Glucose-Capillary: 118 mg/dL — ABNORMAL HIGH (ref 70–99)

## 2013-01-10 LAB — BASIC METABOLIC PANEL
Chloride: 108 mEq/L (ref 96–112)
GFR calc Af Amer: 38 mL/min — ABNORMAL LOW (ref >90)
GFR calc non Af Amer: 33 mL/min — ABNORMAL LOW (ref >90)
Glucose, Bld: 145 mg/dL — ABNORMAL HIGH (ref 70–99)
Potassium: 6 mEq/L — ABNORMAL HIGH (ref 3.5–5.1)
Sodium: 140 mEq/L (ref 135–145)

## 2013-01-10 LAB — SURGICAL PCR SCREEN: Staphylococcus aureus: NEGATIVE

## 2013-01-10 SURGERY — EXCISION, SMALL INTESTINE
Anesthesia: General | Site: Abdomen | Wound class: Contaminated

## 2013-01-10 MED ORDER — OXYCODONE HCL 5 MG PO TABS
ORAL_TABLET | ORAL | Status: AC
  Administered 2013-01-10: 5 mg
  Filled 2013-01-10: qty 1

## 2013-01-10 MED ORDER — OXYCODONE HCL 5 MG/5ML PO SOLN: 5.0000 mg | Freq: Once | ORAL | Status: AC | PRN

## 2013-01-10 MED ORDER — MIDAZOLAM HCL 5 MG/5ML IJ SOLN
INTRAMUSCULAR | Status: DC | PRN
  Administered 2013-01-10: 2 mg via INTRAVENOUS

## 2013-01-10 MED ORDER — SUCCINYLCHOLINE CHLORIDE 20 MG/ML IJ SOLN
INTRAMUSCULAR | Status: DC | PRN
  Administered 2013-01-10: 100 mg via INTRAVENOUS

## 2013-01-10 MED ORDER — ONDANSETRON HCL 4 MG/2ML IJ SOLN
INTRAMUSCULAR | Status: DC | PRN
  Administered 2013-01-10: 4 mg via INTRAVENOUS

## 2013-01-10 MED ORDER — HYDROMORPHONE HCL PF 1 MG/ML IJ SOLN
INTRAMUSCULAR | Status: AC
  Filled 2013-01-10: qty 1

## 2013-01-10 MED ORDER — ROCURONIUM BROMIDE 100 MG/10ML IV SOLN
INTRAVENOUS | Status: DC | PRN
  Administered 2013-01-10: 30 mg via INTRAVENOUS
  Administered 2013-01-10: 10 mg via INTRAVENOUS

## 2013-01-10 MED ORDER — LACTATED RINGERS IV SOLN
INTRAVENOUS | Status: DC
  Administered 2013-01-10 (×3): via INTRAVENOUS

## 2013-01-10 MED ORDER — SODIUM CHLORIDE 0.9 % IV BOLUS (SEPSIS)
1000.0000 mL | Freq: Once | INTRAVENOUS | Status: AC
  Administered 2013-01-10: 08:00:00 via INTRAVENOUS

## 2013-01-10 MED ORDER — LIDOCAINE HCL (CARDIAC) 20 MG/ML IV SOLN
INTRAVENOUS | Status: DC | PRN
  Administered 2013-01-10: 70 mg via INTRAVENOUS

## 2013-01-10 MED ORDER — ONDANSETRON HCL 4 MG/2ML IJ SOLN: 4.0000 mg | Freq: Four times a day (QID) | INTRAMUSCULAR | Status: DC | PRN

## 2013-01-10 MED ORDER — PHENYLEPHRINE HCL 10 MG/ML IJ SOLN
INTRAMUSCULAR | Status: DC | PRN
  Administered 2013-01-10 (×4): 80 ug via INTRAVENOUS

## 2013-01-10 MED ORDER — NEOSTIGMINE METHYLSULFATE 1 MG/ML IJ SOLN
INTRAMUSCULAR | Status: DC | PRN
  Administered 2013-01-10: 3 mg via INTRAVENOUS

## 2013-01-10 MED ORDER — PROPOFOL 10 MG/ML IV BOLUS
INTRAVENOUS | Status: DC | PRN
  Administered 2013-01-10: 130 mg via INTRAVENOUS

## 2013-01-10 MED ORDER — HYDROMORPHONE HCL PF 1 MG/ML IJ SOLN
0.2500 mg | INTRAMUSCULAR | Status: DC | PRN
  Administered 2013-01-10 (×4): 0.5 mg via INTRAVENOUS

## 2013-01-10 MED ORDER — 0.9 % SODIUM CHLORIDE (POUR BTL) OPTIME
TOPICAL | Status: DC | PRN
  Administered 2013-01-10 (×2): 1000 mL

## 2013-01-10 MED ORDER — FENTANYL CITRATE 0.05 MG/ML IJ SOLN
INTRAMUSCULAR | Status: DC | PRN
  Administered 2013-01-10: 150 ug via INTRAVENOUS
  Administered 2013-01-10 (×2): 50 ug via INTRAVENOUS

## 2013-01-10 MED ORDER — GLYCOPYRROLATE 0.2 MG/ML IJ SOLN
INTRAMUSCULAR | Status: DC | PRN
  Administered 2013-01-10: 0.4 mg via INTRAVENOUS

## 2013-01-10 MED ORDER — OXYCODONE HCL 5 MG PO TABS
5.0000 mg | ORAL_TABLET | Freq: Once | ORAL | Status: AC | PRN
  Administered 2013-01-10: 5 mg via ORAL

## 2013-01-10 MED ORDER — SODIUM CHLORIDE 0.9 % IV SOLN
INTRAVENOUS | Status: DC
  Administered 2013-01-10: 10:00:00 via INTRAVENOUS
  Filled 2013-01-10: qty 1000

## 2013-01-10 MED ORDER — CLINDAMYCIN PHOSPHATE 600 MG/50ML IV SOLN
600.0000 mg | Freq: Once | INTRAVENOUS | Status: AC
  Administered 2013-01-10: 600 mg via INTRAVENOUS
  Filled 2013-01-10: qty 50

## 2013-01-10 SURGICAL SUPPLY — 41 items
CANISTER SUCTION 2500CC (MISCELLANEOUS) ×2 IMPLANT
CHLORAPREP W/TINT 26ML (MISCELLANEOUS) ×2 IMPLANT
CLOTH BEACON ORANGE TIMEOUT ST (SAFETY) ×2 IMPLANT
COVER SURGICAL LIGHT HANDLE (MISCELLANEOUS) ×2 IMPLANT
DRAPE LAPAROSCOPIC ABDOMINAL (DRAPES) ×2 IMPLANT
DRAPE UTILITY 15X26 W/TAPE STR (DRAPE) ×4 IMPLANT
DRAPE WARM FLUID 44X44 (DRAPE) ×2 IMPLANT
DRSG COVADERM 4X10 (GAUZE/BANDAGES/DRESSINGS) ×1 IMPLANT
ELECT BLADE 6.5 EXT (BLADE) ×1 IMPLANT
ELECT CAUTERY BLADE 6.4 (BLADE) ×2 IMPLANT
ELECT REM PT RETURN 9FT ADLT (ELECTROSURGICAL) ×2
ELECTRODE REM PT RTRN 9FT ADLT (ELECTROSURGICAL) ×1 IMPLANT
GLOVE SURG SS PI 7.5 STRL IVOR (GLOVE) ×4 IMPLANT
GOWN STRL NON-REIN LRG LVL3 (GOWN DISPOSABLE) ×4 IMPLANT
GOWN STRL REIN XL XLG (GOWN DISPOSABLE) ×2 IMPLANT
KIT BASIN OR (CUSTOM PROCEDURE TRAY) ×2 IMPLANT
KIT ROOM TURNOVER OR (KITS) ×2 IMPLANT
LIGASURE IMPACT 36 18CM CVD LR (INSTRUMENTS) IMPLANT
NS IRRIG 1000ML POUR BTL (IV SOLUTION) ×4 IMPLANT
PACK GENERAL/GYN (CUSTOM PROCEDURE TRAY) ×2 IMPLANT
PAD ARMBOARD 7.5X6 YLW CONV (MISCELLANEOUS) ×2 IMPLANT
RELOAD PROXIMATE 75MM BLUE (ENDOMECHANICALS) ×4 IMPLANT
RELOAD STAPLE 75 3.8 BLU REG (ENDOMECHANICALS) IMPLANT
SLEEVE SURGEON STRL (DRAPES) ×4 IMPLANT
SPECIMEN JAR X LARGE (MISCELLANEOUS) IMPLANT
SPONGE LAP 18X18 X RAY DECT (DISPOSABLE) IMPLANT
STAPLER GUN LINEAR PROX 60 (STAPLE) ×1 IMPLANT
STAPLER PROXIMATE 75MM BLUE (STAPLE) ×1 IMPLANT
STAPLER VISISTAT 35W (STAPLE) ×2 IMPLANT
SUCTION POOLE TIP (SUCTIONS) ×2 IMPLANT
SUT PDS AB 1 TP1 96 (SUTURE) ×5 IMPLANT
SUT SILK 2 0 (SUTURE) ×2
SUT SILK 2 0 SH CR/8 (SUTURE) ×2 IMPLANT
SUT SILK 2-0 18XBRD TIE 12 (SUTURE) ×1 IMPLANT
SUT SILK 3 0 (SUTURE) ×2
SUT SILK 3 0 SH CR/8 (SUTURE) ×3 IMPLANT
SUT SILK 3-0 18XBRD TIE 12 (SUTURE) ×1 IMPLANT
SYR BULB IRRIGATION 50ML (SYRINGE) ×1 IMPLANT
TOWEL OR 17X26 10 PK STRL BLUE (TOWEL DISPOSABLE) ×2 IMPLANT
TRAY FOLEY CATH 14FRSI W/METER (CATHETERS) ×1 IMPLANT
YANKAUER SUCT BULB TIP NO VENT (SUCTIONS) ×1 IMPLANT

## 2013-01-10 NOTE — Anesthesia Procedure Notes (Signed)
Procedure Name: Intubation Date/Time: 01/10/2013 11:57 AM Performed by: Charm Barges, Signora Zucco R Pre-anesthesia Checklist: Patient identified, Emergency Drugs available, Suction available, Patient being monitored and Timeout performed Patient Re-evaluated:Patient Re-evaluated prior to inductionOxygen Delivery Method: Circle system utilized Preoxygenation: Pre-oxygenation with 100% oxygen Intubation Type: IV induction, Cricoid Pressure applied and Rapid sequence Laryngoscope Size: Mac and 3 Grade View: Grade II Tube type: Oral Tube size: 7.5 mm Number of attempts: 1 Airway Equipment and Method: Stylet Placement Confirmation: ETT inserted through vocal cords under direct vision,  positive ETCO2 and breath sounds checked- equal and bilateral Secured at: 20 cm Tube secured with: Tape Dental Injury: Teeth and Oropharynx as per pre-operative assessment

## 2013-01-10 NOTE — Progress Notes (Signed)
Pt arrived to OR Holding c/o itching. States itching started last pm.  Dr. Biagio Quint notified .

## 2013-01-10 NOTE — Op Note (Signed)
[name], [name] NO.:  [id number]  MEDICAL RECORD NO.:  [id number]  LOCATION:  6N12C                        FACILITY:  [facility]  PHYSICIAN:  Lodema Pilot, MD       DATE OF BIRTH:  October 25, 1951  DATE OF PROCEDURE:  01/10/2013 DATE OF DISCHARGE:                              OPERATIVE REPORT   PROCEDURE:  Exploratory laparotomy with lysis of adhesions and small- bowel resection.  PREOPERATIVE DIAGNOSIS:  Bowel obstruction.  POSTOPERATIVE DIAGNOSIS:  Bowel obstruction.  SURGEON:  Lodema Pilot, MD  ASSISTANT:  None.  ANESTHESIA:  General endotracheal anesthesia.  FLUIDS:  1200 mL crystalloid.  ESTIMATED BLOOD LOSS:  Minimal.  DRAINS:  None.  SPECIMENS:  Small bowel resection sent to Pathology for permanent sectioning.  COMPLICATIONS:  None apparent.  FINDINGS:  A single transition point towards the distal small bowel adhered to the abdominal wall with the bowel adhered to itself and then to the abdominal wall.  The single segment of small bowel about 4 inches long resected and primary side-to-side functional end-to-end anastomosis was created.  Remainder of the bowel appeared normal.  INDICATION OF PROCEDURE:  [name] is a 62 year old female with a 2-day history of midabdominal pain, nausea and vomiting, and she comes into the emergency room complaining of abdominal pain and CT scan consistent with a high-grade bowel obstruction.  Nonoperative management was attempted, but she failed to improve overnight and had significant pain and elected to proceed with exploratory laparotomy and lysis of adhesions.  OPERATIVE DETAILS:  [name] was seen and evaluated in the preoperative area.  Risks and benefits of the procedure were discussed in lay terms. Informed consent was obtained.  She was taken to the operating room, placed on the table in supine position.  Foley catheter was placed and prophylactic antibiotics were given.  Procedure time-out was  performed with all operative team members confirmed for outpatient procedure, and a midline incision was made in the skin.  Dissection was carried down to the subcutaneous tissue using Bovie electrocautery.  The fascia was incised and elevated and continuing to be opened up along the lengthy incision.  Just under the umbilicus, she had a loop of small bowel, which was adhered to the abdominal wall as well as a tongue of omentum which was adhered to the same area.  I gradually worked this omentum and the loop of small bowel down from the abdominal wall in order to free up the abdominal wall adhesions.  This was really the only area of adhesions that was identified.  Once I had this loop down, I was able to eviscerate the small bowel and ran the bowel from distal to proximal via ileocecal valve was identified and the small bowel was run proximally. This was decompressed and normal in appearance and it ran to the area of the adhesion as the transition point.  The bowel proximal to this was dilated and this was a clear transition point and obstruction.  She had this loop of small bowel kind of had adhered to itself kinking the bowel.  I tried on to the adhesions to itself but still appeared kinked and I felt that  it would be best to resect this segment of small bowel. I made a window through the mesentery just proximal and just distal to the small bowel, divided the 4 inch segment of small intestine in the area of the adhesions in the transition point and divided the bowel with the GIA stapler.  The mesentery was divided with 2-0 silk ligatures. This segment of small bowel was sent to Pathology for permanent section and then 2 small enterotomies were made and a side-to-side functional end-to-end anastomosis was created with the 75 mm blue GIA stapler.  The anastomosis was hemostatic internally and a blue TA 60 stapler was used to close the common enterotomy.  The staple line was oversewn  with interrupted 3-0 silk sutures in Lembert fashion and a crotch stitch was placed.  The mesenteric defect was approximated with interrupted 2-0 silk sutures and the anastomosis appeared patent.  The bowel was ran again distal and proximally from the ileocecal valve to the ligament of Treitz and there was no other adhesions or evidence of obstruction.  The NG tube was palpated and appeared to be good position and in the body of the stomach and no other abdominal wall adhesions were identified.  The abdomen was irrigated with sterile saline solution and irrigation returned clear.  The bowel was replaced back in the abdomen and the abdominal fascia was approximated with #1 looped PDS x2.  The wound was irrigated with sterile saline solution and skin edges were approximated with skin staples.  Skin was washed and dried and sterile dressing was applied.  All sponge, needle, and instrument counts correct in the case. The patient tolerated the procedure well without apparent complications.          ______________________________ Lodema Pilot, MD     BL/MEDQ  D:  01/10/2013  T:  01/10/2013  Job:  [id number]

## 2013-01-10 NOTE — Anesthesia Postprocedure Evaluation (Signed)
Anesthesia Post Note  Patient: [name]  Procedure(s) Performed: Procedure(s) (LRB): SMALL BOWEL RESECTION (N/A) LYSIS OF ADHESION (N/A) EXPLORATORY LAPAROTOMY (N/A)  Anesthesia type: General  Patient location: PACU  Post pain: Pain level controlled and Adequate analgesia  Post assessment: Post-op Vital signs reviewed, Patient's Cardiovascular Status Stable, Respiratory Function Stable, Patent Airway and Pain level controlled  Last Vitals:  Filed Vitals:   01/10/13 1400  BP: 138/90  Pulse:   Temp:   Resp:     Post vital signs: Reviewed and stable  Level of consciousness: awake, alert  and oriented  Complications: No apparent anesthesia complications

## 2013-01-10 NOTE — Progress Notes (Signed)
Subjective: Not much better.  She is "okay as long as she keeps the pain medications in"  Objective: Vital signs in last 24 hours: Temp:  [97.2 F (36.2 C)-98.7 F (37.1 C)] 97.2 F (36.2 C) (03/19 0800) Pulse Rate:  [65-112] 74 (03/19 0800) Resp:  [15-26] 19 (03/19 0800) BP: (111-187)/(39-94) 133/39 mmHg (03/19 0800) SpO2:  [94 %-100 %] 97 % (03/19 0800) Weight:  [210 lb 1.6 oz (95.3 kg)] 210 lb 1.6 oz (95.3 kg) (03/18 1735) Last BM Date: 01/08/13  Intake/Output from previous day: 03/18 0701 - 03/19 0700 In: 1662.1 [I.V.:1572.1; NG/GT:90] Out: 2050 [Urine:300; Emesis/NG output:700] Intake/Output this shift: Total I/O In: -  Out: 400 [Urine:400]  General appearance: alert, cooperative and no distress Resp: nonlabored Cardio: HR normal, regular GI: soft, persistent mid abdominal distension and tenderness, she has focal voluntary guarding in mid abdomen just above her umbilicus, no diffuse tenderness or peritonitis  Lab Results:   Recent Labs  01/09/13 1743 01/10/13 0620  WBC 8.8 5.4  HGB 12.9 11.3*  HCT 38.8 35.1*  PLT 280 250   BMET  Recent Labs  01/09/13 1107 01/09/13 1743 01/10/13 0510  NA 139  --  140  K 5.0  --  6.0*  CL 104  --  108  CO2 22  --  20  GLUCOSE 168*  --  145*  BUN 24*  --  32*  CREATININE 1.28* 1.39* 1.62*  CALCIUM 10.1  --  8.8   PT/INR No results found for this basename: LABPROT, INR,  in the last 72 hours ABG No results found for this basename: PHART, PCO2, PO2, HCO3,  in the last 72 hours  Studies/Results: Dg Abd 1 View  01/09/2013  *RADIOLOGY REPORT*  Clinical Data: Nasogastric tube placement.  ABDOMEN - 1 VIEW  Comparison: CT of the abdomen and pelvis 01/09/2013.  Findings: A nasogastric tube is present with tip in the proximal stomach and side port just proximal to the gastroesophageal junction.  Numerous dilated loops of gas-filled small bowel are noted measuring up to 4.6 cm in diameter.  No definite evidence of  pneumoperitoneum is identified on this examination.  There is some gas and stool scattered throughout the colon, including what appears to be some distal rectal gas.  Contrast material is seen filling the urinary bladder, presumably related to the recent CT examination.  Soft tissue calcification projects over the right ilium, but is in the posterior soft tissues on the recent CT examination.  IMPRESSION: 1.  Nasogastric tube is only in the proximal stomach (and the side port is in the distal esophagus), and could be advanced at least 10 cm for more optimal placement. 2.  Persistent dilatation of the small bowel compatible with small bowel obstruction, as above.   Original Report Authenticated By: Trudie Reed, M.D.    Ct Abdomen Pelvis W Contrast  01/09/2013  *RADIOLOGY REPORT*  Clinical Data: Mid/lower abdominal pain  CT ABDOMEN AND PELVIS WITH CONTRAST  Technique:  Multidetector CT imaging of the abdomen and pelvis was performed following the standard protocol during bolus administration of intravenous contrast.  Contrast: OMNIPAQUE IOHEXOL 300 MG/ML  SOLN  Comparison: None.  Findings: Atelectasis in the lingula and bilateral lower lobes.  Coronary atherosclerosis (series 2/image 3).  Liver, spleen, pancreas, and adrenal glands within normal limits.  Gallbladder is unremarkable.  No intrahepatic or extrahepatic ductal dilatation.  Kidneys within normal limits.  No hydronephrosis.  Dilated loops of proximal small bowel, with abrupt transition in the  mid/lower abdomen anteriorly (coronal image 37). Distal small bowel and colon are relatively decompressed.  This appearance is compatible with high-grade partial or complete small bowel obstruction.  No pneumatosis or free air.  Trace pelvic ascites.  Atherosclerotic calcifications of the abdominal aorta and branch vessels.  No suspicious abdominopelvic lymphadenopathy.  Status post hysterectomy.  No adnexal masses.  Bladder is within normal limits.  Mild  degenerative changes of the visualized thoracolumbar spine.  IMPRESSION: High grade partial or complete small bowel obstruction with abrupt transition in the mid/lower anterior abdomen.  No pneumatosis or free air.  Trace pelvic ascites.   Original Report Authenticated By: Charline Bills, M.D.    Dg Abd 2 Views  01/10/2013  *RADIOLOGY REPORT*  Clinical Data: Follow up small bowel obstruction, mid abdominal pain  ABDOMEN - 2 VIEW  Comparison: KUB of 01/09/2013  Findings: The tip of the NG tube is in the body of the stomach. There is persistent dilatation of small bowel loops with air-fluid levels consistent with persistent partial small bowel obstruction. No colonic distention is seen.  No free air is noted.  Contrast fills the urinary bladder.  IMPRESSION:  1.  Persistent partial small bowel obstruction.  Little change in the degree of dilatation. 2.  NG tube tip in mid body of stomach.   Original Report Authenticated By: Dwyane Dee, M.D.     Anti-infectives: Anti-infectives   Start     Dose/Rate Route Frequency Ordered Stop   01/10/13 0830  clindamycin (CLEOCIN) IVPB 600 mg     600 mg 100 mL/hr over 30 Minutes Intravenous  Once 01/10/13 4098        Assessment/Plan: s/p * No surgery found * I am concerned that she has not shown much improvement in her symptoms or exam or imaging.  I would expect that her tenderness would be improved with the NG.  However, since she still has significant abdominal tenderness and relatively little improvement overnight, I have discussed with her the option for surgical exploration and LOA vs. continued nonop management.  She says that she is "tired of the pain" and would like to proceed with operative management.  I really think that this is the right choice given her symptoms and imaging and lack of improvement.  I discussed with her the risks of the procedure including infection, bleeding, pain, scarring, recurrent adhesions and obstruction, bowel injury and need  for bowel resection and she expressed understanding and desires to proceed with ex. Laparotomy, LOA, and possible bowel resection.  LOS: 1 day    Lodema Pilot DAVID 01/10/2013

## 2013-01-10 NOTE — Transfer of Care (Signed)
Immediate Anesthesia Transfer of Care Note  Patient: [name]  Procedure(s) Performed: Procedure(s): SMALL BOWEL RESECTION (N/A) LYSIS OF ADHESION (N/A) EXPLORATORY LAPAROTOMY (N/A)  Patient Location: PACU  Anesthesia Type:General  Level of Consciousness: awake, alert  and oriented  Airway & Oxygen Therapy: Patient Spontanous Breathing and Patient connected to face mask oxygen  Post-op Assessment: Report given to PACU RN, Post -op Vital signs reviewed and stable and Patient moving all extremities  Post vital signs: Reviewed and stable  Complications: No apparent anesthesia complications

## 2013-01-10 NOTE — Brief Op Note (Signed)
01/09/2013 - 01/10/2013  1:50 PM  PATIENT:  [name]  62 y.o. female  PRE-OPERATIVE DIAGNOSIS:  bowel obstruction  POST-OPERATIVE DIAGNOSIS:  Bowel Obstruction  PROCEDURE:  Procedure(s): SMALL BOWEL RESECTION (N/A) LYSIS OF ADHESION (N/A) EXPLORATORY LAPAROTOMY (N/A)  SURGEON:  Surgeon(s) and Role:    * Lodema Pilot, DO - Primary  PHYSICIAN ASSISTANT:   ASSISTANTS: none   ANESTHESIA:   general  EBL:  Total I/O In: 1200 [I.V.:1200] Out: 825 [Urine:400; Emesis/NG output:350; Blood:75]  BLOOD ADMINISTERED:none  DRAINS: none   LOCAL MEDICATIONS USED:  NONE  SPECIMEN:  Source of Specimen:  small bowel  DISPOSITION OF SPECIMEN:  PATHOLOGY  COUNTS:  YES  TOURNIQUET:  * No tourniquets in log *  DICTATION: .Other Dictation: Dictation Number [id number]  PLAN OF CARE: Admit to inpatient   PATIENT DISPOSITION:  PACU - hemodynamically stable.   Delay start of Pharmacological VTE agent (>24hrs) due to surgical blood loss or risk of bleeding: no

## 2013-01-10 NOTE — Anesthesia Preprocedure Evaluation (Addendum)
Anesthesia Evaluation  Patient identified by MRN, date of birth, ID band Patient awake    Reviewed: Allergy & Precautions, H&P , NPO status , Patient's Chart, lab work & pertinent test results  Airway Mallampati: II  Neck ROM: full    Dental   Pulmonary sleep apnea ,          Cardiovascular hypertension, + CAD  Pt has a h/o atypical chest pains.  She visited the ER for these pains in January 2014 and ruled out MI with EKG and troponins all negative.  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  Stress test was recommended by cardiology do be done as outpatient after her ER visit in 10/2012.  She has not done this yet.    Neuro/Psych  Headaches, Anxiety Depression  Neuromuscular disease    GI/Hepatic GERD-  ,  Endo/Other  diabetes, Type 2obese  Renal/GU Renal InsufficiencyRenal disease     Musculoskeletal  (+) Arthritis -,   Abdominal   Peds  Hematology   Anesthesia Other Findings   Reproductive/Obstetrics                        Anesthesia Physical Anesthesia Plan  ASA: III  Anesthesia Plan: General   Post-op Pain Management:    Induction: Intravenous  Airway Management Planned: Oral ETT  Additional Equipment:   Intra-op Plan:   Post-operative Plan: Extubation in OR  Informed Consent: I have reviewed the patients History and Physical, chart, labs and discussed the procedure including the risks, benefits and alternatives for the proposed anesthesia with the patient or authorized representative who has indicated his/her understanding and acceptance.     Plan Discussed with: CRNA, Surgeon and Anesthesiologist  Anesthesia Plan Comments: (Pt understands risk of surgery and anesthesia.  Due to the urgent nature of her bowel obstruction, further delay of surgery for cardiac stress test would not be beneficial.  Her chest pains have not been localized to a cardiac source in  the past.  She understands her situation and wishes to proceed with the surgery to help her bowel obstruction.  )       Anesthesia Quick Evaluation

## 2013-01-11 ENCOUNTER — Encounter (HOSPITAL_COMMUNITY): Payer: Self-pay | Admitting: General Surgery

## 2013-01-11 LAB — BASIC METABOLIC PANEL
CO2: 23 mEq/L (ref 19–32)
Calcium: 9.6 mg/dL (ref 8.4–10.5)
Creatinine, Ser: 1.46 mg/dL — ABNORMAL HIGH (ref 0.50–1.10)
GFR calc non Af Amer: 37 mL/min — ABNORMAL LOW (ref >90)
Glucose, Bld: 171 mg/dL — ABNORMAL HIGH (ref 70–99)
Sodium: 139 mEq/L (ref 135–145)

## 2013-01-11 LAB — GLUCOSE, CAPILLARY
Glucose-Capillary: 131 mg/dL — ABNORMAL HIGH (ref 70–99)
Glucose-Capillary: 132 mg/dL — ABNORMAL HIGH (ref 70–99)
Glucose-Capillary: 140 mg/dL — ABNORMAL HIGH (ref 70–99)
Glucose-Capillary: 153 mg/dL — ABNORMAL HIGH (ref 70–99)
Glucose-Capillary: 158 mg/dL — ABNORMAL HIGH (ref 70–99)

## 2013-01-11 MED ORDER — DIPHENHYDRAMINE HCL 12.5 MG/5ML PO ELIX
12.5000 mg | ORAL_SOLUTION | Freq: Four times a day (QID) | ORAL | Status: DC | PRN
  Filled 2013-01-11: qty 5

## 2013-01-11 MED ORDER — ONDANSETRON HCL 4 MG/2ML IJ SOLN
4.0000 mg | Freq: Four times a day (QID) | INTRAMUSCULAR | Status: DC | PRN
  Administered 2013-01-13: 4 mg via INTRAVENOUS
  Filled 2013-01-11: qty 2

## 2013-01-11 MED ORDER — INSULIN ASPART 100 UNIT/ML ~~LOC~~ SOLN
0.0000 [IU] | SUBCUTANEOUS | Status: DC
  Administered 2013-01-11 – 2013-01-12 (×3): 2 [IU] via SUBCUTANEOUS
  Administered 2013-01-12: 3 [IU] via SUBCUTANEOUS
  Administered 2013-01-12 – 2013-01-14 (×4): 2 [IU] via SUBCUTANEOUS
  Administered 2013-01-15: 3 [IU] via SUBCUTANEOUS
  Administered 2013-01-16 – 2013-01-17 (×5): 2 [IU] via SUBCUTANEOUS

## 2013-01-11 MED ORDER — DIPHENHYDRAMINE HCL 50 MG/ML IJ SOLN: 12.5000 mg | Freq: Four times a day (QID) | INTRAMUSCULAR | Status: DC | PRN

## 2013-01-11 MED ORDER — SODIUM CHLORIDE 0.9 % IJ SOLN: 9.0000 mL | INTRAMUSCULAR | Status: DC | PRN

## 2013-01-11 MED ORDER — HYDROMORPHONE 0.3 MG/ML IV SOLN
INTRAVENOUS | Status: DC
  Administered 2013-01-11: 0.9 mg via INTRAVENOUS
  Administered 2013-01-11: 10:00:00 via INTRAVENOUS
  Administered 2013-01-11: 2.4 mg via INTRAVENOUS
  Administered 2013-01-12: 5.81 mg via INTRAVENOUS
  Administered 2013-01-12 (×2): via INTRAVENOUS
  Administered 2013-01-12: 0.9 mg via INTRAVENOUS
  Administered 2013-01-12: 2.7 mg via INTRAVENOUS
  Administered 2013-01-12: 0.6 mg via INTRAVENOUS
  Administered 2013-01-12: 3.6 mg via INTRAVENOUS
  Administered 2013-01-12: 4.5 mg via INTRAVENOUS
  Administered 2013-01-13: 1.5 mg via INTRAVENOUS
  Administered 2013-01-13: 3 mg via INTRAVENOUS
  Administered 2013-01-13: 0.3 mg via INTRAVENOUS
  Administered 2013-01-13: 2.97 mg via INTRAVENOUS
  Administered 2013-01-13: 06:00:00 via INTRAVENOUS
  Administered 2013-01-13: 3.6 mg via INTRAVENOUS
  Administered 2013-01-13: 1.5 mg via INTRAVENOUS
  Administered 2013-01-14 (×3): 0.6 mg via INTRAVENOUS
  Administered 2013-01-14: 3 mg via INTRAVENOUS
  Administered 2013-01-14: 1.2 mg via INTRAVENOUS
  Administered 2013-01-14: 1.27 mg via INTRAVENOUS
  Administered 2013-01-15 (×2): 0.6 mg via INTRAVENOUS
  Administered 2013-01-15: 1.2 mg via INTRAVENOUS
  Administered 2013-01-15: 15:00:00 via INTRAVENOUS
  Administered 2013-01-15: 2.4 mg via INTRAVENOUS
  Administered 2013-01-15: 3 mg via INTRAVENOUS
  Administered 2013-01-16: 1.2 mg via INTRAVENOUS
  Administered 2013-01-16: 3 mg via INTRAVENOUS
  Administered 2013-01-16: 1.8 mg via INTRAVENOUS
  Administered 2013-01-16: 1.5 mg via INTRAVENOUS
  Filled 2013-01-11 (×9): qty 25

## 2013-01-11 MED ORDER — NALOXONE HCL 0.4 MG/ML IJ SOLN: 0.4000 mg | INTRAMUSCULAR | Status: DC | PRN

## 2013-01-11 MED ORDER — SODIUM CHLORIDE 0.9 % IV SOLN
INTRAVENOUS | Status: DC
  Administered 2013-01-12 – 2013-01-16 (×14): via INTRAVENOUS

## 2013-01-11 NOTE — Progress Notes (Signed)
BMET called to  Lodema Pilot MD

## 2013-01-11 NOTE — Progress Notes (Signed)
1 Day Post-Op  Subjective: C/o abdominal pain.    Objective: Vital signs in last 24 hours: Temp:  [97.5 F (36.4 C)-101.1 F (38.4 C)] 100.5 F (38.1 C) (03/20 0517) Pulse Rate:  [82-107] 97 (03/20 0753) Resp:  [18-21] 18 (03/20 0517) BP: (132-183)/(54-90) 153/79 mmHg (03/20 0753) SpO2:  [90 %-96 %] 92 % (03/20 0517) Last BM Date: 01/08/13  Intake/Output from previous day: 03/19 0701 - 03/20 0700 In: 2071.7 [I.V.:2071.7] Out: 3325 [Urine:1700; Emesis/NG output:1550; Blood:75] Intake/Output this shift:    General appearance: alert, cooperative and no distress Resp: nonlabored Cardio: normal rate, regular GI: soft, appropriate incisional tenderness, dressing intact, no peritoneal signs  Lab Results:   Recent Labs  01/09/13 1743 01/10/13 0620  WBC 8.8 5.4  HGB 12.9 11.3*  HCT 38.8 35.1*  PLT 280 250   BMET  Recent Labs  01/09/13 1107 01/09/13 1743 01/10/13 0510  NA 139  --  140  K 5.0  --  6.0*  CL 104  --  108  CO2 22  --  20  GLUCOSE 168*  --  145*  BUN 24*  --  32*  CREATININE 1.28* 1.39* 1.62*  CALCIUM 10.1  --  8.8   PT/INR No results found for this basename: LABPROT, INR,  in the last 72 hours ABG No results found for this basename: PHART, PCO2, PO2, HCO3,  in the last 72 hours  Studies/Results: Dg Abd 1 View  01/09/2013  *RADIOLOGY REPORT*  Clinical Data: Nasogastric tube placement.  ABDOMEN - 1 VIEW  Comparison: CT of the abdomen and pelvis 01/09/2013.  Findings: A nasogastric tube is present with tip in the proximal stomach and side port just proximal to the gastroesophageal junction.  Numerous dilated loops of gas-filled small bowel are noted measuring up to 4.6 cm in diameter.  No definite evidence of pneumoperitoneum is identified on this examination.  There is some gas and stool scattered throughout the colon, including what appears to be some distal rectal gas.  Contrast material is seen filling the urinary bladder, presumably related to the  recent CT examination.  Soft tissue calcification projects over the right ilium, but is in the posterior soft tissues on the recent CT examination.  IMPRESSION: 1.  Nasogastric tube is only in the proximal stomach (and the side port is in the distal esophagus), and could be advanced at least 10 cm for more optimal placement. 2.  Persistent dilatation of the small bowel compatible with small bowel obstruction, as above.   Original Report Authenticated By: Trudie Reed, M.D.    Ct Abdomen Pelvis W Contrast  01/09/2013  *RADIOLOGY REPORT*  Clinical Data: Mid/lower abdominal pain  CT ABDOMEN AND PELVIS WITH CONTRAST  Technique:  Multidetector CT imaging of the abdomen and pelvis was performed following the standard protocol during bolus administration of intravenous contrast.  Contrast: OMNIPAQUE IOHEXOL 300 MG/ML  SOLN  Comparison: None.  Findings: Atelectasis in the lingula and bilateral lower lobes.  Coronary atherosclerosis (series 2/image 3).  Liver, spleen, pancreas, and adrenal glands within normal limits.  Gallbladder is unremarkable.  No intrahepatic or extrahepatic ductal dilatation.  Kidneys within normal limits.  No hydronephrosis.  Dilated loops of proximal small bowel, with abrupt transition in the mid/lower abdomen anteriorly (coronal image 37). Distal small bowel and colon are relatively decompressed.  This appearance is compatible with high-grade partial or complete small bowel obstruction.  No pneumatosis or free air.  Trace pelvic ascites.  Atherosclerotic calcifications of the abdominal aorta  and branch vessels.  No suspicious abdominopelvic lymphadenopathy.  Status post hysterectomy.  No adnexal masses.  Bladder is within normal limits.  Mild degenerative changes of the visualized thoracolumbar spine.  IMPRESSION: High grade partial or complete small bowel obstruction with abrupt transition in the mid/lower anterior abdomen.  No pneumatosis or free air.  Trace pelvic ascites.   Original  Report Authenticated By: Charline Bills, M.D.    Dg Abd 2 Views  01/10/2013  *RADIOLOGY REPORT*  Clinical Data: Follow up small bowel obstruction, mid abdominal pain  ABDOMEN - 2 VIEW  Comparison: KUB of 01/09/2013  Findings: The tip of the NG tube is in the body of the stomach. There is persistent dilatation of small bowel loops with air-fluid levels consistent with persistent partial small bowel obstruction. No colonic distention is seen.  No free air is noted.  Contrast fills the urinary bladder.  IMPRESSION:  1.  Persistent partial small bowel obstruction.  Little change in the degree of dilatation. 2.  NG tube tip in mid body of stomach.   Original Report Authenticated By: Dwyane Dee, M.D.     Anti-infectives: Anti-infectives   Start     Dose/Rate Route Frequency Ordered Stop   01/10/13 0830  [MAR Hold]  clindamycin (CLEOCIN) IVPB 600 mg     (On MAR Hold since 01/10/13 1039)   600 mg 100 mL/hr over 30 Minutes Intravenous  Once 01/10/13 0819 01/10/13 1200      Assessment/Plan: s/p Procedure(s): SMALL BOWEL RESECTION (N/A) LYSIS OF ADHESION (N/A) EXPLORATORY LAPAROTOMY (N/A) remove foley, mobilize.  change to PCA.  pulmonary toilet.  BMP this am to follow creatinine and potassium  LOS: 2 days    [name] 01/11/2013

## 2013-01-11 NOTE — Progress Notes (Signed)
PT  Up in chair x3 tolerating well walked to bathroom x3 tolerating ,voiding without any problems, PCA seems to help but pt has to be encourage to push button her IS up to 1250 at this time

## 2013-01-12 LAB — BASIC METABOLIC PANEL
BUN: 21 mg/dL (ref 6–23)
GFR calc Af Amer: 57 mL/min — ABNORMAL LOW (ref >90)
GFR calc non Af Amer: 49 mL/min — ABNORMAL LOW (ref >90)
Potassium: 4 mEq/L (ref 3.5–5.1)

## 2013-01-12 LAB — CBC
HCT: 31.7 % — ABNORMAL LOW (ref 36.0–46.0)
MCHC: 32.2 g/dL (ref 30.0–36.0)
Platelets: 212 10*3/uL (ref 150–400)
RDW: 15.3 % (ref 11.5–15.5)
WBC: 5.5 10*3/uL (ref 4.0–10.5)

## 2013-01-12 LAB — GLUCOSE, CAPILLARY
Glucose-Capillary: 139 mg/dL — ABNORMAL HIGH (ref 70–99)
Glucose-Capillary: 155 mg/dL — ABNORMAL HIGH (ref 70–99)
Glucose-Capillary: 136 mg/dL — ABNORMAL HIGH (ref 70–99)
Glucose-Capillary: 115 mg/dL — ABNORMAL HIGH (ref 70–99)

## 2013-01-12 MED ORDER — CHLORHEXIDINE GLUCONATE 0.12 % MT SOLN
15.0000 mL | Freq: Two times a day (BID) | OROMUCOSAL | Status: DC
  Administered 2013-01-12 – 2013-01-16 (×9): 15 mL via OROMUCOSAL
  Filled 2013-01-12 (×4): qty 15

## 2013-01-12 MED ORDER — BIOTENE DRY MOUTH MT LIQD
15.0000 mL | Freq: Two times a day (BID) | OROMUCOSAL | Status: DC
  Administered 2013-01-12 – 2013-01-15 (×7): 15 mL via OROMUCOSAL

## 2013-01-12 NOTE — Progress Notes (Signed)
She is feeling fine and no nausea or vomiting with her NG clamped.  She has been walking and now sitting upright.  Her abdomen is less tender but she does have some more upper abdominal bloating and distension, I do not hear much bowel sounds.  I placed her NG back to suction and didn't get much out but I will plan on leaving this in to reevaluate in the am.  I think that this is not unexpected to have some delayed bowel function when doing an anastamosis in the setting of a bowel obstruction.

## 2013-01-12 NOTE — Progress Notes (Signed)
2 Days Post-Op  Subjective: Improving. No flatus or BM  Objective: Vital signs in last 24 hours: Temp:  [98.1 F (36.7 C)-98.5 F (36.9 C)] 98.5 F (36.9 C) (03/21 0505) Pulse Rate:  [79-94] 91 (03/21 0505) Resp:  [16-33] 20 (03/21 0754) BP: (121-174)/(61-82) 121/64 mmHg (03/21 0505) SpO2:  [91 %-98 %] 97 % (03/21 0754) FiO2 (%):  [91 %-95 %] 95 % (03/20 1600) Last BM Date: 01/08/13  Intake/Output from previous day: 03/20 0701 - 03/21 0700 In: 2360.4 [I.V.:2060.4; NG/GT:300] Out: 2800 [Urine:2550; Emesis/NG output:250] Intake/Output this shift:    General appearance: alert, cooperative and no distress Resp: nonlabored Cardio: normal rate, regular GI: soft, midline incisional tenderness, ND, wound looks fine without sign of infection, no peritoneal signs  Lab Results:   Recent Labs  01/09/13 1743 01/10/13 0620  WBC 8.8 5.4  HGB 12.9 11.3*  HCT 38.8 35.1*  PLT 280 250   BMET  Recent Labs  01/10/13 0510 01/11/13 0935  NA 140 139  K 6.0* 4.5  CL 108 103  CO2 20 23  GLUCOSE 145* 171*  BUN 32* 27*  CREATININE 1.62* 1.46*  CALCIUM 8.8 9.6   PT/INR No results found for this basename: LABPROT, INR,  in the last 72 hours ABG No results found for this basename: PHART, PCO2, PO2, HCO3,  in the last 72 hours  Studies/Results: No results found.  Anti-infectives: Anti-infectives   Start     Dose/Rate Route Frequency Ordered Stop   01/10/13 0830  [MAR Hold]  clindamycin (CLEOCIN) IVPB 600 mg     (On MAR Hold since 01/10/13 1039)   600 mg 100 mL/hr over 30 Minutes Intravenous  Once 01/10/13 0819 01/10/13 1200      Assessment/Plan: s/p Procedure(s): SMALL BOWEL RESECTION (N/A) LYSIS OF ADHESION (N/A) EXPLORATORY LAPAROTOMY (N/A) she seems to be improving.  mobilize.  clamp NG and possibly out this afternoon. Awaiting bowel function.  LOS: 3 days    Lodema Pilot DAVID 01/12/2013

## 2013-01-13 ENCOUNTER — Inpatient Hospital Stay (HOSPITAL_COMMUNITY): Payer: Medicare Other

## 2013-01-13 LAB — GLUCOSE, CAPILLARY
Glucose-Capillary: 104 mg/dL — ABNORMAL HIGH (ref 70–99)
Glucose-Capillary: 110 mg/dL — ABNORMAL HIGH (ref 70–99)
Glucose-Capillary: 124 mg/dL — ABNORMAL HIGH (ref 70–99)

## 2013-01-13 NOTE — Progress Notes (Signed)
3 Days Post-Op  Subjective: Still no flatus or BM.    Objective: Vital signs in last 24 hours: Temp:  [97.3 F (36.3 C)-98.5 F (36.9 C)] 97.3 F (36.3 C) (03/22 0609) Pulse Rate:  [74-95] 95 (03/22 0609) Resp:  [13-20] 15 (03/22 0802) BP: (87-176)/(49-79) 176/68 mmHg (03/22 0609) SpO2:  [93 %-99 %] 96 % (03/22 0802) Last BM Date: 01/08/13  Intake/Output from previous day: 03/21 0701 - 03/22 0700 In: 2939.1 [I.V.:2939.1] Out: 2800 [Urine:2050; Emesis/NG output:750] Intake/Output this shift: Total I/O In: -  Out: 200 [Urine:200]  General appearance: alert, cooperative and no distress Resp: clear to auscultation bilaterally Cardio: normal rate, regular GI: soft, mild incisional tenderness, still with some mild distension, wound without infection, NG with some thin green output, bowel sounds scant  Lab Results:   Recent Labs  01/12/13 0856  WBC 5.5  HGB 10.2*  HCT 31.7*  PLT 212   BMET  Recent Labs  01/11/13 0935 01/12/13 0856  NA 139 136  K 4.5 4.0  CL 103 101  CO2 23 22  GLUCOSE 171* 164*  BUN 27* 21  CREATININE 1.46* 1.16*  CALCIUM 9.6 9.2   PT/INR No results found for this basename: LABPROT, INR,  in the last 72 hours ABG No results found for this basename: PHART, PCO2, PO2, HCO3,  in the last 72 hours  Studies/Results: No results found.  Anti-infectives: Anti-infectives   Start     Dose/Rate Route Frequency Ordered Stop   01/10/13 0830  [MAR Hold]  clindamycin (CLEOCIN) IVPB 600 mg     (On MAR Hold since 01/10/13 1039)   600 mg 100 mL/hr over 30 Minutes Intravenous  Once 01/10/13 0819 01/10/13 1200      Assessment/Plan: s/p Procedure(s): SMALL BOWEL RESECTION (N/A) LYSIS OF ADHESION (N/A) EXPLORATORY LAPAROTOMY (N/A) she looks okay and she says that her preop pain has improved.  we are waiting improvement in bowel function. I suspect that this is postop ileus but will check abdominal xrays today.  LOS: 4 days    Lodema Pilot  DAVID 01/13/2013

## 2013-01-14 LAB — GLUCOSE, CAPILLARY
Glucose-Capillary: 106 mg/dL — ABNORMAL HIGH (ref 70–99)
Glucose-Capillary: 113 mg/dL — ABNORMAL HIGH (ref 70–99)

## 2013-01-14 NOTE — Progress Notes (Signed)
4 Days Post-Op  Subjective: No new issues.  Feels better and less bloated. +flatus  Objective: Vital signs in last 24 hours: Temp:  [97.5 F (36.4 C)-98.4 F (36.9 C)] 97.8 F (36.6 C) (03/23 0645) Pulse Rate:  [70-88] 74 (03/23 0645) Resp:  [12-22] 22 (03/23 0809) BP: (150-194)/(68-86) 194/86 mmHg (03/23 0645) SpO2:  [96 %-100 %] 98 % (03/23 0809) FiO2 (%):  [98 %] 98 % (03/23 0000) Last BM Date: 01/08/13  Intake/Output from previous day: 01-29-2023 0701 - 03/23 0700 In: 1116.7 [I.V.:1116.7] Out: 3400 [Urine:2950; Emesis/NG output:450] Intake/Output this shift:    General appearance: alert, cooperative and no distress Resp: clear to auscultation bilaterally Cardio: normal rate, regular GI: soft, decreasing incisional tenderness, still mild distension, +BS today  Lab Results:   Recent Labs  01/12/13 0856  WBC 5.5  HGB 10.2*  HCT 31.7*  PLT 212   BMET  Recent Labs  01/12/13 0856  NA 136  K 4.0  CL 101  CO2 22  GLUCOSE 164*  BUN 21  CREATININE 1.16*  CALCIUM 9.2   PT/INR No results found for this basename: LABPROT, INR,  in the last 72 hours ABG No results found for this basename: PHART, PCO2, PO2, HCO3,  in the last 72 hours  Studies/Results: Dg Abd 2 Views  January 28, 2013  *RADIOLOGY REPORT*  Clinical Data: History of bowel obstruction.  Postop laparotomy day 3  ABDOMEN - 2 VIEW  Comparison: 01/10/2013  Findings: NG tube in the stomach in good position.  Small bowel dilatation shows mild improvement from the prior study.  There is contrast in the right colon.  Colon is decompressed.  No free air. Surgical clips in the midline from laparotomy.  IMPRESSION: NG tube in the stomach.  Improvement in small bowel dilatation.   Original Report Authenticated By: Janeece Riggers, M.D.     Anti-infectives: Anti-infectives   Start     Dose/Rate Route Frequency Ordered Stop   01/10/13 0830  [MAR Hold]  clindamycin (CLEOCIN) IVPB 600 mg     (On MAR Hold since 01/10/13 1039)   600 mg 100 mL/hr over 30 Minutes Intravenous  Once 01/10/13 0819 01/10/13 1200      Assessment/Plan: s/p Procedure(s): SMALL BOWEL RESECTION (N/A) LYSIS OF ADHESION (N/A) EXPLORATORY LAPAROTOMY (N/A) She seems to be improving.  xray improving from preop.  Her abdomen is less distended and I do hear some bowel sounds.  she is passing flatus and we will try to clamp NG and hopefully be able to remove tomorrow  LOS: 5 days    Lodema Pilot DAVID 01/14/2013

## 2013-01-15 LAB — GLUCOSE, CAPILLARY
Glucose-Capillary: 102 mg/dL — ABNORMAL HIGH (ref 70–99)
Glucose-Capillary: 117 mg/dL — ABNORMAL HIGH (ref 70–99)
Glucose-Capillary: 123 mg/dL — ABNORMAL HIGH (ref 70–99)
Glucose-Capillary: 127 mg/dL — ABNORMAL HIGH (ref 70–99)
Glucose-Capillary: 162 mg/dL — ABNORMAL HIGH (ref 70–99)

## 2013-01-15 MED ORDER — ONDANSETRON HCL 4 MG PO TABS: 4.0000 mg | ORAL_TABLET | Freq: Three times a day (TID) | ORAL | Status: DC | PRN

## 2013-01-15 MED ORDER — METOPROLOL SUCCINATE ER 25 MG PO TB24
25.0000 mg | ORAL_TABLET | Freq: Every day | ORAL | Status: DC
  Filled 2013-01-15: qty 1

## 2013-01-15 MED ORDER — METOPROLOL TARTRATE 1 MG/ML IV SOLN
10.0000 mg | Freq: Four times a day (QID) | INTRAVENOUS | Status: DC
  Filled 2013-01-15 (×3): qty 10

## 2013-01-15 MED ORDER — SODIUM CHLORIDE 0.9 % IJ SOLN: 10.0000 mL | INTRAMUSCULAR | Status: DC | PRN

## 2013-01-15 MED ORDER — OXYCODONE-ACETAMINOPHEN 5-325 MG PO TABS
1.0000 | ORAL_TABLET | ORAL | Status: DC | PRN
  Administered 2013-01-15: 2 via ORAL
  Filled 2013-01-15: qty 2

## 2013-01-15 MED ORDER — METOPROLOL TARTRATE 1 MG/ML IV SOLN
10.0000 mg | Freq: Four times a day (QID) | INTRAVENOUS | Status: DC
  Administered 2013-01-15 – 2013-01-17 (×7): 10 mg via INTRAVENOUS

## 2013-01-15 NOTE — Progress Notes (Addendum)
NGT out Tolerating clears Patient examined and I agree with the assessment and plan  Violeta Gelinas, MD, MPH, FACS Pager: [phone]  01/15/2013 3:51 PM

## 2013-01-15 NOTE — Progress Notes (Signed)
Asked to assess patient for PIV using [place] - unable to locate any lower arm vein adequate to access, one brachial vein  right arm above AC that lies next to artery so no attempt to acces that vein at this time. RN aware - to notify MD af inability to obtain IV access.

## 2013-01-15 NOTE — Progress Notes (Signed)
Patient ID: [name], female   DOB: 29-Jun-1951, 62 y.o.   MRN: [id number] 5 Days Post-Op  Subjective: Pt feels ok, still having pain.  Had flatus and a BM.  Tolerating NGT clamping.  Hasn't gotten out of bed to mobilize since surgery.  Objective: Vital signs in last 24 hours: Temp:  [97.4 F (36.3 C)-98.8 F (37.1 C)] 97.4 F (36.3 C) (03/24 0440) Pulse Rate:  [64-71] 69 (03/24 0440) Resp:  [13-19] 16 (03/24 0823) BP: (168-208)/(64-82) 186/70 mmHg (03/24 0440) SpO2:  [94 %-100 %] 97 % (03/24 0823) Last BM Date: 01/08/13  Intake/Output from previous day: 03/23 0701 - 03/24 0700 In: 3000 [I.V.:3000] Out: 2550 [Urine:2350; Emesis/NG output:200] Intake/Output this shift:    PE: Abd: soft, less tender, few BS, obese, incision c/d/i with staples  Lab Results:  No results found for this basename: WBC, HGB, HCT, PLT,  in the last 72 hours BMET No results found for this basename: NA, K, CL, CO2, GLUCOSE, BUN, CREATININE, CALCIUM,  in the last 72 hours PT/INR No results found for this basename: LABPROT, INR,  in the last 72 hours CMP     Component Value Date/Time   NA 136 01/12/2013 0856   K 4.0 01/12/2013 0856   CL 101 01/12/2013 0856   CO2 22 01/12/2013 0856   GLUCOSE 164* 01/12/2013 0856   BUN 21 01/12/2013 0856   CREATININE 1.16* 01/12/2013 0856   CREATININE 1.71* 03/09/2012 1154   CALCIUM 9.2 01/12/2013 0856   PROT 8.4* 01/09/2013 1107   ALBUMIN 3.9 01/09/2013 1107   AST 18 01/09/2013 1107   ALT 16 01/09/2013 1107   ALKPHOS 91 01/09/2013 1107   BILITOT 0.3 01/09/2013 1107   GFRNONAA 49* 01/12/2013 0856   GFRAA 57* 01/12/2013 0856   Lipase     Component Value Date/Time   LIPASE 50 01/09/2013 1107       Studies/Results: Dg Abd 2 Views  01/16/2013  *RADIOLOGY REPORT*  Clinical Data: History of bowel obstruction.  Postop laparotomy day 3  ABDOMEN - 2 VIEW  Comparison: 01/10/2013  Findings: NG tube in the stomach in good position.  Small bowel dilatation shows mild  improvement from the prior study.  There is contrast in the right colon.  Colon is decompressed.  No free air. Surgical clips in the midline from laparotomy.  IMPRESSION: NG tube in the stomach.  Improvement in small bowel dilatation.   Original Report Authenticated By: Janeece Riggers, M.D.     Anti-infectives: Anti-infectives   Start     Dose/Rate Route Frequency Ordered Stop   01/10/13 0830  [MAR Hold]  clindamycin (CLEOCIN) IVPB 600 mg     (On MAR Hold since 01/10/13 1039)   600 mg 100 mL/hr over 30 Minutes Intravenous  Once 01/10/13 0819 01/10/13 1200       Assessment/Plan  1. S/p ex lap with LOA, SBR  2. Deconditioning 3. Post op ileus, resolving  Plan: 1. Dc NGT and give clear liquids 2. Must ambulate TID in halls 3. PT eval.  LOS: 6 days    Aradhana Gin E 01/15/2013, 9:37 AM Pager: [phone]

## 2013-01-15 NOTE — Progress Notes (Signed)
Peripherally Inserted Central Catheter/Midline Placement  The IV Nurse has discussed with the patient and/or persons authorized to consent for the patient, the purpose of this procedure and the potential benefits and risks involved with this procedure.  The benefits include less needle sticks, lab draws from the catheter and patient may be discharged home with the catheter.  Risks include, but not limited to, infection, bleeding, blood clot (thrombus formation), and puncture of an artery; nerve damage and irregular heat beat.  Alternatives to this procedure were also discussed.  PICC/Midline Placement Documentation        Mellissa Kohut 01/15/2013, 6:26 PM

## 2013-01-15 NOTE — Evaluation (Signed)
Physical Therapy Evaluation Patient Details Name: [name] MRN: [id number] DOB: 05/20/1951 Today's Date: 01/15/2013 Time: 3086-5784 PT Time Calculation (min): 25 min  PT Assessment / Plan / Recommendation Clinical Impression  Pt is a pleasent 62 y.o. female who presents with deficits in functional mobility secondary to pain, weakness, and immobility. Patient demonstrates good willingness to participate. Pt tolerated in room ambulation and performed ther-ex. Encouraged continued ambulation with nsg. Will continue to see to maximize independence for discharge.    PT Assessment  Patient needs continued PT services    Follow Up Recommendations  Home health PT;Supervision - Intermittent    Does the patient have the potential to tolerate intense rehabilitation      Barriers to Discharge None      Equipment Recommendations   (TBD may need rw with 5" wheels)    Recommendations for Other Services     Frequency Min 3X/week    Precautions / Restrictions Restrictions Weight Bearing Restrictions: No   Pertinent Vitals/Pain 5/10      Mobility  Bed Mobility Bed Mobility: Not assessed Transfers Transfers: Sit to Stand;Stand to Sit Sit to Stand: 4: Min guard Stand to Sit: 4: Min guard Details for Transfer Assistance: Iv pole for stability Ambulation/Gait Ambulation/Gait Assistance: 5: Supervision;4: Min guard Ambulation Distance (Feet): 35 Feet Assistive device:  (iv pole) Ambulation/Gait Assistance Details: Pt steady but slow, multiple rest breaks required for in room ambulation secondary to increased pain Gait Pattern: Step-through pattern;Decreased stride length;Trunk flexed;Narrow base of support Gait velocity: decreased  General Gait Details: pt very slow but steady with ambulation, requires multiple rest breaks secondary to pain Stairs: No    Exercises General Exercises - Lower Extremity Ankle Circles/Pumps: Both;20 reps Long Arc Quad: AROM;Both;10 reps Hip  ABduction/ADduction: AROM;Both;10 reps   PT Diagnosis: Difficulty walking;Abnormality of gait;Generalized weakness;Acute pain  PT Problem List: Decreased strength;Decreased range of motion;Decreased activity tolerance;Decreased balance;Decreased mobility PT Treatment Interventions: DME instruction;Gait training;Stair training;Functional mobility training;Therapeutic activities;Therapeutic exercise   PT Goals Acute Rehab PT Goals PT Goal Formulation: With patient Time For Goal Achievement: 01/22/13 Potential to Achieve Goals: Good Pt will go Supine/Side to Sit: Independently PT Goal: Supine/Side to Sit - Progress: Goal set today Pt will go Sit to Supine/Side: Independently PT Goal: Sit to Supine/Side - Progress: Goal set today Pt will go Sit to Stand: Independently PT Goal: Sit to Stand - Progress: Goal set today Pt will go Stand to Sit: Independently PT Goal: Stand to Sit - Progress: Goal set today Pt will Ambulate: Independently PT Goal: Ambulate - Progress: Goal set today  Visit Information  Last PT Received On: 01/15/13 Assistance Needed: +1    Subjective Data  Subjective: I am going to try to move more Patient Stated Goal: to go home   Prior Functioning  Home Living Lives With: Spouse Available Help at Discharge: Family;Available 24 hours/day Type of Home: Apartment Home Access: Level entry Home Layout: One level Bathroom Shower/Tub: Engineer, manufacturing systems: Standard Home Adaptive Equipment: None Prior Function Level of Independence: Independent Able to Take Stairs?: Yes Driving: Yes Vocation: On disability Communication Communication: No difficulties Dominant Hand: Right    Cognition  Cognition Overall Cognitive Status: Appears within functional limits for tasks assessed/performed Arousal/Alertness: Awake/alert Orientation Level: Appears intact for tasks assessed;Oriented X4 / Intact Behavior During Session: [facility] for tasks performed     Extremity/Trunk Assessment Right Upper Extremity Assessment RUE ROM/Strength/Tone: [facility] for tasks assessed RUE Sensation: Deficits RUE Sensation Deficits: some numbness reported in the  fingers of RUE Left Upper Extremity Assessment LUE ROM/Strength/Tone: WFL for tasks assessed Right Lower Extremity Assessment RLE ROM/Strength/Tone: [facility] for tasks assessed Left Lower Extremity Assessment LLE ROM/Strength/Tone: WFL for tasks assessed   Balance Balance Balance Assessed: Yes High Level Balance High Level Balance Activites: Side stepping;Backward walking;Direction changes;Turns;Head turns High Level Balance Comments: pt slow but steady  End of Session PT - End of Session Equipment Utilized During Treatment: Gait belt Activity Tolerance: Patient tolerated treatment well;Patient limited by pain Patient left: in chair;with call bell/phone within reach Nurse Communication: Mobility status  GP     Fabio Asa 01/15/2013, 12:36 PM  Charlotte Crumb, PT DPT  [phone]

## 2013-01-16 ENCOUNTER — Ambulatory Visit: Payer: PRIVATE HEALTH INSURANCE | Admitting: Cardiology

## 2013-01-16 LAB — GLUCOSE, CAPILLARY
Glucose-Capillary: 128 mg/dL — ABNORMAL HIGH (ref 70–99)
Glucose-Capillary: 130 mg/dL — ABNORMAL HIGH (ref 70–99)
Glucose-Capillary: 92 mg/dL (ref 70–99)

## 2013-01-16 MED ORDER — HYDROCODONE-ACETAMINOPHEN 5-325 MG PO TABS
1.0000 | ORAL_TABLET | ORAL | Status: DC | PRN
  Administered 2013-01-16 (×3): 2 via ORAL
  Filled 2013-01-16 (×3): qty 2

## 2013-01-16 MED ORDER — DOCUSATE SODIUM 100 MG PO CAPS: 100.0000 mg | ORAL_CAPSULE | Freq: Two times a day (BID) | ORAL | Status: DC | PRN

## 2013-01-16 NOTE — Progress Notes (Signed)
6 Days Post-Op  Subjective: Pt feels pretty good today.  Pt working with PT/OT.  Pt tolerating clear liquids, had a BM and passing flatus.  Pt has minimal pain over incision.  Objective: Vital signs in last 24 hours: Temp:  [97.9 F (36.6 C)-98.8 F (37.1 C)] 98 F (36.7 C) (03/25 0500) Pulse Rate:  [64-74] 74 (03/25 0500) Resp:  [13-24] 19 (03/25 0827) BP: (158-172)/(49-75) 172/68 mmHg (03/25 0500) SpO2:  [95 %-100 %] 97 % (03/25 0827) Last BM Date: 01/08/13  Intake/Output from previous day: 03/24 0701 - 03/25 0700 In: 1920 [P.O.:920; I.V.:1000] Out: 2900 [Urine:2900] Intake/Output this shift:    PE: Gen:  Alert, NAD, pleasant Abd: Soft, NT/ND, +BS, no HSM, incisions C/D/I   Lab Results:  No results found for this basename: WBC, HGB, HCT, PLT,  in the last 72 hours BMET No results found for this basename: NA, K, CL, CO2, GLUCOSE, BUN, CREATININE, CALCIUM,  in the last 72 hours PT/INR No results found for this basename: LABPROT, INR,  in the last 72 hours CMP     Component Value Date/Time   NA 136 01/12/2013 0856   K 4.0 01/12/2013 0856   CL 101 01/12/2013 0856   CO2 22 01/12/2013 0856   GLUCOSE 164* 01/12/2013 0856   BUN 21 01/12/2013 0856   CREATININE 1.16* 01/12/2013 0856   CREATININE 1.71* 03/09/2012 1154   CALCIUM 9.2 01/12/2013 0856   PROT 8.4* 01/09/2013 1107   ALBUMIN 3.9 01/09/2013 1107   AST 18 01/09/2013 1107   ALT 16 01/09/2013 1107   ALKPHOS 91 01/09/2013 1107   BILITOT 0.3 01/09/2013 1107   GFRNONAA 49* 01/12/2013 0856   GFRAA 57* 01/12/2013 0856   Lipase     Component Value Date/Time   LIPASE 50 01/09/2013 1107       Studies/Results: No results found.  Anti-infectives: Anti-infectives   Start     Dose/Rate Route Frequency Ordered Stop   01/10/13 0830  [MAR Hold]  clindamycin (CLEOCIN) IVPB 600 mg     (On MAR Hold since 01/10/13 1039)   600 mg 100 mL/hr over 30 Minutes Intravenous  Once 01/10/13 0819 01/10/13 1200       Assessment/Plan 1. S/p  ex lap with LOA, SBR  2. Deconditioning  3. Post op ileus, resolving   Plan:  1. Advance diet to fulls, heart diet tomorrow am if tolerating 2. Must ambulate TID in halls  3. PT/OT eval. 4. Likely be discharged tomorrow if tolerating diet tomorrow am.    LOS: 7 days    [name], [name] Mccadden 01/16/2013, 9:06 AM Pager: [phone]

## 2013-01-16 NOTE — Evaluation (Signed)
Occupational Therapy Evaluation and Discharge Patient Details Name: [name] MRN: [id number] DOB: April 30, 1951 Today's Date: 01/16/2013 Time: 4098-1191 OT Time Calculation (min): 21 min  OT Assessment / Plan / Recommendation Clinical Impression  This 62 yo female admitted with abdominal pain and now s/p exp lap presents to acute OT with all education and recommnedations completed. WIll sign off.    OT Assessment  Patient does not need any further OT services    Follow Up Recommendations  No OT follow up       Equipment Recommendations  3 in 1 bedside comode (RW)          Precautions / Restrictions Precautions Precaution Comments: Abdominal incision Restrictions Weight Bearing Restrictions: No       ADL  Equipment Used: Rolling walker Transfers/Ambulation Related to ADLs: S for all ADL Comments: Pt has husband to help prn for LB ADLs. Have asked them to follow up with surgeon as to whether or not she can get incision wet in a shower now or later.         Visit Information  Last OT Received On: 01/16/13 Assistance Needed: +1    Subjective Data  Subjective: I am her help for her socks and shoes (her husband who was sitting beside her)   Prior Functioning     Home Living Lives With: Spouse Available Help at Discharge: Family;Available 24 hours/day Type of Home: Apartment Home Access: Level entry Home Layout: One level Bathroom Shower/Tub: Engineer, manufacturing systems: Standard Home Adaptive Equipment: None Prior Function Level of Independence: Independent Able to Take Stairs?: Yes Driving: Yes Vocation: On disability Communication Communication: No difficulties Dominant Hand: Right            Cognition  Cognition Overall Cognitive Status: Appears within functional limits for tasks assessed/performed Arousal/Alertness: Awake/alert Orientation Level: Appears intact for tasks assessed Behavior During Session: [facility] for tasks performed     Extremity/Trunk Assessment Right Upper Extremity Assessment RUE ROM/Strength/Tone: Within functional levels Left Upper Extremity Assessment LUE ROM/Strength/Tone: Within functional levels     Mobility Bed Mobility Details for Bed Mobility Assistance: Pt up in recliner upon arrival Transfers Transfers: Sit to Stand;Stand to Sit Sit to Stand: 5: Supervision;With upper extremity assist;With armrests;From chair/3-in-1 Stand to Sit: 5: Supervision;With armrests;With upper extremity assist;To chair/3-in-1           End of Session OT - End of Session Activity Tolerance: Patient tolerated treatment well Patient left: in chair;with call bell/phone within reach;with family/visitor present       Evette Georges [phone] 01/16/2013, 11:19 AM

## 2013-01-16 NOTE — Progress Notes (Signed)
Physical Therapy Treatment Patient Details Name: [name] MRN: [id number] DOB: [birth year] Today's Date: 01/16/2013 Time: 9562-1308 PT Time Calculation (min): 32 min  PT Assessment / Plan / Recommendation Comments on Treatment Session  62 y.o. female POD #6 s/p exploratory laparotomy 2* SBO. Pt is progressing well with mobility, she walked 400' with RW today. OK to DC home from PT standpoint.     Follow Up Recommendations  Home health PT;Supervision - Intermittent     Does the patient have the potential to tolerate intense rehabilitation     Barriers to Discharge        Equipment Recommendations  Rolling walker with 5" wheels (TBD may need rw with 5" wheels)    Recommendations for Other Services    Frequency Min 3X/week   Plan Discharge plan remains appropriate    Precautions / Restrictions Precautions Precaution Comments: Abdominal incision Restrictions Weight Bearing Restrictions: No   Pertinent Vitals/Pain *7/10 pain, lower abdominal incision with walking Pt premedicated**    Mobility  Bed Mobility Bed Mobility: Supine to Sit Supine to Sit: 6: Modified independent (Device/Increase time);HOB elevated;With rails Details for Bed Mobility Assistance: Pt up in recliner upon arrival Transfers Sit to Stand: 6: Modified independent (Device/Increase time);From bed;With upper extremity assist Stand to Sit: To toilet;With upper extremity assist;With armrests Ambulation/Gait Ambulation/Gait Assistance: 5: Supervision Ambulation Distance (Feet): 400 Feet Assistive device: Rolling walker Ambulation/Gait Assistance Details: Very slow, but steady. No LOB.  Gait Pattern: Within Functional Limits Gait velocity: Decreased    Exercises     PT Diagnosis:    PT Problem List:   PT Treatment Interventions:     PT Goals Acute Rehab PT Goals PT Goal Formulation: With patient Time For Goal Achievement: 01/22/13 Potential to Achieve Goals: Good Pt will go Supine/Side to Sit:  Independently PT Goal: Supine/Side to Sit - Progress: Progressing toward goal Pt will go Sit to Supine/Side: Independently Pt will go Sit to Stand: Independently PT Goal: Sit to Stand - Progress: Progressing toward goal Pt will go Stand to Sit: Independently PT Goal: Stand to Sit - Progress: Progressing toward goal Pt will Ambulate: >150 feet;with modified independence;with rolling walker PT Goal: Ambulate - Progress: Met  Visit Information  Last PT Received On: 01/16/13 Assistance Needed: +1    Subjective Data  Subjective: I walked in the hall this morning.  Patient Stated Goal: to sleep   Cognition  Cognition Overall Cognitive Status: Appears within functional limits for tasks assessed/performed Arousal/Alertness: Awake/alert Orientation Level: Appears intact for tasks assessed Behavior During Session: [facility] for tasks performed    Balance     End of Session PT - End of Session Activity Tolerance: Patient tolerated treatment well;Patient limited by pain Patient left: in chair;with call bell/phone within reach;with family/visitor present Nurse Communication: Mobility status   GP     Ralene Bathe Kistler 01/16/2013, 1:51 PM [phone]

## 2013-01-16 NOTE — Progress Notes (Signed)
Fulls + mobilization.  Hope to D/C tomorrow. Patient examined and I agree with the assessment and plan  Violeta Gelinas, MD, MPH, FACS Pager: [phone]  01/16/2013 12:03 PM

## 2013-01-17 LAB — GLUCOSE, CAPILLARY
Glucose-Capillary: 111 mg/dL — ABNORMAL HIGH (ref 70–99)
Glucose-Capillary: 137 mg/dL — ABNORMAL HIGH (ref 70–99)

## 2013-01-17 MED ORDER — HYDROCODONE-ACETAMINOPHEN 5-325 MG PO TABS: 1.0000 | ORAL_TABLET | Freq: Four times a day (QID) | ORAL | Status: DC | PRN

## 2013-01-17 NOTE — Progress Notes (Signed)
HHPT and DME arranged with [facility] per pt choice and insurance provider network limitations. Address and phone number in EPIC is correct per patient confirmation.

## 2013-01-17 NOTE — Discharge Summary (Signed)
Physician Discharge Summary  Patient ID: [name] MRN: [id number] DOB/AGE: 1951-02-11 62 y.o.  Admit date: 01/09/2013 Discharge date: 01/17/2013  Admitting Diagnosis:.  Uncontrolled type 2 diabetes mellitus with insulin therapy .  VITAMIN D DEFICIENCY .  HYPERLIPIDEMIA .  PERIPHERAL NEUROPATHY .  HYPERTENSION .  CAD .  GERD .  SYMPTOM, APNEA, SLEEP NOS .  DEGENERATIVE JOINT DISEASE, BACK .  Chronic renal insufficiency .  Anxiety .  Depression .  Microcytic anemia .  Tension headache, chronic .  Carpal tunnel syndrome, bilateral .  Chronic chest pain .  Myalgia .  SBO (small bowel obstruction)    Discharge Diagnosis Patient Active Problem List   Diagnosis Date Noted  . SBO (small bowel obstruction) 01/09/2013  . Myalgia 12/21/2012  . Chronic chest pain 11/21/2012  . Carpal tunnel syndrome, bilateral 07/25/2012  . Tension headache, chronic 05/24/2012  . Microcytic anemia 09/22/2011  . Depression 07/14/2011  . Anxiety 06/02/2011  . Chronic renal insufficiency 01/22/2011  . DEGENERATIVE JOINT DISEASE, BACK 11/20/2010  . CAD 03/10/2010  . HYPERLIPIDEMIA 12/11/2008  . VITAMIN D DEFICIENCY 11/26/2008  . Uncontrolled type 2 diabetes mellitus with insulin therapy 05/25/2007  . PERIPHERAL NEUROPATHY 05/25/2007  . HYPERTENSION 05/25/2007  . GERD 05/25/2007  . SYMPTOM, APNEA, SLEEP NOS 05/25/2007    Consultants None  Imaging: No results found.  Procedures Dr. Biagio Quint (01/10/13) Exploratory laparotomy with lysis of adhesions and small bowel resection  Hospital Course:  62 yo obese black female who had an acute onset of abdominal pain on 01/08/13. She had some nausea and dry heaving, but no emesis. She had a BM and flatus the day before admission. No other symptoms. Because of severe pain, she presented to [facility] on 01/09/13 for further evaluation. She had a CT scan that revealed a high grade SBO. We have been asked to see for further evaluation and admission.     Patient was admitted and underwent procedure listed above.  Tolerated procedure well and was transferred to the floor.  Post-operatively she had an ileus which took a few days to resolve.  Diet was advanced as tolerated.  On POD #7, the patient was voiding well, tolerating diet, ambulating well, pain well controlled, vital signs stable, incisions c/d/i and felt stable for discharge home.  Patient will follow up in our office on POD #10-14 for staple removal and in 2 weeks with Dr. Biagio Quint, and knows to call with questions or concerns.  She will have home health PT and some DME items.    Physical Exam: General:  Alert, NAD, pleasant, comfortable Abd:  Soft, ND, mild tenderness, incisions C/D/I     Medication List    STOP taking these medications       HYDROcodone-acetaminophen 10-325 MG per tablet  Commonly known as:  NORCO      TAKE these medications       ACCU-CHEK FASTCLIX LANCETS Misc  1 each by Does not apply route 3 (three) times daily before meals. Dx code 250.00     amLODipine 5 MG tablet  Commonly known as:  NORVASC  Take 1 tablet (5 mg total) by mouth daily.     aspirin 81 MG tablet  Take 81 mg by mouth daily.     buPROPion 300 MG 24 hr tablet  Commonly known as:  WELLBUTRIN XL  Take 300 mg by mouth daily.     DEXILANT 60 MG capsule  Generic drug:  dexlansoprazole  Take 60 mg by mouth daily.  diclofenac sodium 1 % Gel  Commonly known as:  VOLTAREN  Apply 2 g topically 3 (three) times daily as needed.     ferrous sulfate 325 (65 FE) MG tablet  Take 1 tablet (325 mg total) by mouth daily.     ferrous sulfate 325 (65 FE) MG tablet  Take 325 mg by mouth daily with breakfast.     FLUoxetine 20 MG capsule  Commonly known as:  PROZAC  Take 1 capsule (20 mg total) by mouth every morning.     fluticasone 50 MCG/ACT nasal spray  Commonly known as:  FLONASE  Place 2 sprays into the nose daily.     gabapentin 800 MG tablet  Commonly known as:  NEURONTIN   Take 1 tablet (800 mg total) by mouth 3 (three) times daily. Take one tablet by mouth in am and 2 tablets by mouth in pm     glucose blood test strip  Commonly known as:  ACCU-CHEK SMARTVIEW  Use to check blood sugar before meals dx code 250.00     HYDROcodone-acetaminophen 5-325 MG per tablet  Commonly known as:  NORCO/VICODIN  Take 1-2 tablets by mouth every 6 (six) hours as needed for pain.     insulin aspart 100 UNIT/ML injection  Commonly known as:  NOVOLOG  Inject 10 Units into the skin 3 (three) times daily before meals.     insulin glargine 100 UNIT/ML injection  Commonly known as:  LANTUS SOLOSTAR  Inject 50 Units into the skin at bedtime.     Insulin Pen Needle 31G X 5 MM Misc  Box of 100 - uses up to 5 times per day according to CBG     isosorbide mononitrate 60 MG 24 hr tablet  Commonly known as:  IMDUR  Take 1 tablet (60 mg total) by mouth daily.     losartan-hydrochlorothiazide 100-25 MG per tablet  Commonly known as:  HYZAAR  Take 1 tablet by mouth daily.     metoprolol succinate 25 MG 24 hr tablet  Commonly known as:  TOPROL-XL  Take 1 tablet (25 mg total) by mouth daily.     senna 8.6 MG tablet  Commonly known as:  SENOKOT  Take 4 tablets by mouth daily.     simvastatin 20 MG tablet  Commonly known as:  ZOCOR  Take 1 tablet (20 mg total) by mouth at bedtime.             Follow-up Information   Follow up with LAYTON, BRIAN DAVID, DO. Call in 2 weeks. (Call to verify appt time and date)    Contact information:   [address] [place] [address] [phone]       Follow up with Stacy Gardner, MD.   Contact information:   [address] [place] [address] [phone]       Signed: Aris Georgia [facility] [phone]  01/17/2013, 9:20 AM

## 2013-01-17 NOTE — Discharge Summary (Signed)
[name] Eischeid, MD, MPH, FACS Pager: [phone]  

## 2013-01-18 ENCOUNTER — Ambulatory Visit (HOSPITAL_COMMUNITY): Admission: RE | Admit: 2013-01-18 | Payer: PRIVATE HEALTH INSURANCE | Source: Ambulatory Visit

## 2013-01-18 ENCOUNTER — Telehealth (INDEPENDENT_AMBULATORY_CARE_PROVIDER_SITE_OTHER): Payer: Self-pay

## 2013-01-18 NOTE — Telephone Encounter (Signed)
Message copied by Maryan Puls on Thu Jan 18, 2013  2:58 PM ------      Message from: Wilder Glade      Created: Tue Jan 16, 2013  9:21 AM                   ----- Message -----         From: Aris Georgia, PA-C         Sent: 01/16/2013   9:19 AM           To: [facility] Clinical Pool            Layton rn:            Pt needs f/u appt in 2-3 weeks s/p small bowel resection and LOA for sbo            Discharging tomorrow ------

## 2013-01-18 NOTE — Telephone Encounter (Signed)
Called and left message for patient  ** post op appointment date & time for 02/01/13 @ 3:00 pm w/Dr. Biagio Quint **

## 2013-01-21 ENCOUNTER — Telehealth: Payer: Self-pay | Admitting: Internal Medicine

## 2013-01-21 NOTE — Telephone Encounter (Signed)
  INTERNAL MEDICINE RESIDENCY PROGRAM After-Hours Telephone Call    Reason for call:   I received a call from [name] at 7:30 AM indicating that she was having minimal discharge from her recent surgery.  She described discharge as clear with minimal red tinge, without foul odor, and no increased pain.  No fever since hospital discharge.  She reports that wound appears clean.    Pertinent Data:   [name] is a 62 yo F with history of HTN &DM, who was discharged on 01/17/13 after being treated for a SBO (she underwent ex-lap on 01/10/13 by Dr. Biagio Quint).  Post op, she did have an ileus that took a few days to resolve, and by POD#7, she was voiding well, toelrating diet and ambulating.  She has a follow up with [facility] on 02/01/13.    Assessment / Plan / Recommendations:   Given that [name] is not acutely ill, and discharge sounds serous in nature without purulence or foul odor, I asked that [name] contact Dr. Delice Lesch office on the morning of 01/22/13 to make them aware.  As always, pt is advised that if symptoms worsen or new symptoms arise, they should go to an urgent care facility or to to ER for further evaluation.    Belia Heman, MD   01/21/2013, 7:32 AM

## 2013-01-22 ENCOUNTER — Ambulatory Visit (INDEPENDENT_AMBULATORY_CARE_PROVIDER_SITE_OTHER): Payer: PRIVATE HEALTH INSURANCE | Admitting: General Surgery

## 2013-01-22 ENCOUNTER — Encounter (INDEPENDENT_AMBULATORY_CARE_PROVIDER_SITE_OTHER): Payer: Self-pay | Admitting: General Surgery

## 2013-01-22 ENCOUNTER — Other Ambulatory Visit (INDEPENDENT_AMBULATORY_CARE_PROVIDER_SITE_OTHER): Payer: Self-pay

## 2013-01-22 VITALS — BP 120/72 | HR 78 | Temp 97.2°F | Resp 16 | Ht 64.75 in | Wt 192.8 lb

## 2013-01-22 DIAGNOSIS — T8140XA Infection following a procedure, unspecified, initial encounter: Secondary | ICD-10-CM

## 2013-01-22 MED ORDER — CIPROFLOXACIN HCL 500 MG PO TABS: 500.0000 mg | ORAL_TABLET | Freq: Two times a day (BID) | ORAL | Status: AC

## 2013-01-22 MED ORDER — METRONIDAZOLE 250 MG PO TABS: 250.0000 mg | ORAL_TABLET | Freq: Three times a day (TID) | ORAL | Status: AC

## 2013-01-22 NOTE — Progress Notes (Signed)
The patient comes in with a midline wound infection after a small bowel resection for bowel obstruction. On examination she is an induration and redness throughout the entire extent of her wound. There was also some cloudy drainage from the upper portion of the incision and around the umbilicus.  With Betadine prep was able to open the wound partially in the upper portion and also around the umbilicus to the lower portion of the incision. At one point I did get into a significant purulent pocket with some drainage however because of patient discomfort and screaming I had to cease. A was able to pack approximately 2 feet of half inch iodoform Nu Gauze in the lower portion of the wound in the upper small opening in the midportion of the upper. I will start the patient on some ciprofloxacin and Flagyl. I do not feel as though I got an ideal drainage from the wound however with what I have done I do expect that it will start to improve.  We will get home health care to come by the patient's house in order to assess wound and continue to do wet-to-dry dressings at least once a day. She has a followup appointment to see Dr. Biagio Quint on April 10.

## 2013-01-23 ENCOUNTER — Telehealth (INDEPENDENT_AMBULATORY_CARE_PROVIDER_SITE_OTHER): Payer: Self-pay | Admitting: *Deleted

## 2013-01-23 NOTE — ED Provider Notes (Signed)
I have personally seen and examined the patient.  I have discussed the plan of care with the resident.  I have reviewed the documentation on PMH/FH/Soc. History.  I have reviewed the documentation of the resident and agree.  I have reviewed and agree with the ECG interpretation(s) documented by the resident.  This attestation statement is for ED visit on 01/09/13   Joya Gaskins, MD 01/23/13 [phone]

## 2013-01-23 NOTE — Telephone Encounter (Signed)
Patient called to state that the outer dressing that Wyatt MD placed yesterday is soaked through and is running down her leg.  Instructed patient to change ONLY the top bandage and to make sure she does not pull at the packing in any way.  Patient states understanding at this time.  Patient also states that she is suppose to have home health set up to come and do dressing changes.  Patient wanted to make sure that was being setup.

## 2013-01-23 NOTE — Telephone Encounter (Signed)
I am calling home health today to arrange for someone to come out tomorrow.

## 2013-01-25 ENCOUNTER — Telehealth: Payer: Self-pay | Admitting: *Deleted

## 2013-01-25 DIAGNOSIS — G4700 Insomnia, unspecified: Secondary | ICD-10-CM

## 2013-01-25 LAB — WOUND CULTURE: Gram Stain: NONE SEEN

## 2013-01-25 NOTE — Telephone Encounter (Signed)
Pt called with c/o not being able to sleep well at night.  She had surgery on 3/18 for bowel obs. She is taking vicodin for pain from Dr Lianne Cure but asking for something to help her sleep.  Unable to rest day or night. Pt # [id number]

## 2013-01-26 MED ORDER — ZOLPIDEM TARTRATE ER 12.5 MG PO TBCR: 12.5000 mg | EXTENDED_RELEASE_TABLET | Freq: Every evening | ORAL | Status: DC | PRN

## 2013-01-26 NOTE — Telephone Encounter (Signed)
Phoned patient on 01/26/13 at 9am - cannot sleep due to pain.  Has been told to take pain medication scheduled instead of holding out as long as possible.  Would like something to help with sleep as well.    She tells me she has tried the lower dose of ambien in the past without help.  Will do trial of ambien 12.5 qHS for the short term.  Order placed as phone in.

## 2013-01-26 NOTE — Telephone Encounter (Signed)
Rx called in 

## 2013-01-29 ENCOUNTER — Telehealth: Payer: Self-pay | Admitting: *Deleted

## 2013-01-29 DIAGNOSIS — G4700 Insomnia, unspecified: Secondary | ICD-10-CM

## 2013-01-29 NOTE — Telephone Encounter (Signed)
Pt called stating the medication you ordered needed a Prior Authorization. (Ambien 12.5 mg CR) Can you try plain ambien? I don't think that you will require a PA

## 2013-01-31 MED ORDER — ZOLPIDEM TARTRATE 10 MG PO TABS: 5.0000 mg | ORAL_TABLET | Freq: Every evening | ORAL | Status: AC | PRN

## 2013-01-31 NOTE — Telephone Encounter (Signed)
Changed script - ordered as phone in.  Thanks!

## 2013-02-01 ENCOUNTER — Ambulatory Visit (INDEPENDENT_AMBULATORY_CARE_PROVIDER_SITE_OTHER): Payer: PRIVATE HEALTH INSURANCE | Admitting: General Surgery

## 2013-02-01 ENCOUNTER — Encounter (INDEPENDENT_AMBULATORY_CARE_PROVIDER_SITE_OTHER): Payer: Self-pay | Admitting: General Surgery

## 2013-02-01 VITALS — BP 122/74 | HR 92 | Temp 97.3°F | Resp 16 | Ht 64.75 in | Wt 193.0 lb

## 2013-02-01 DIAGNOSIS — Z5189 Encounter for other specified aftercare: Secondary | ICD-10-CM

## 2013-02-01 DIAGNOSIS — Z4889 Encounter for other specified surgical aftercare: Secondary | ICD-10-CM

## 2013-02-01 NOTE — Telephone Encounter (Signed)
New Rx called in

## 2013-02-01 NOTE — Progress Notes (Signed)
Subjective:     Patient ID: [name], female   DOB: 08/21/1951, 62 y.o.   MRN: [id number]  HPI This patient follows up status post exploratory laparotomy with small bowel resection lysis of adhesions for bowel obstruction. She's recovering pre-well from her surgery although this was complicated by a postoperative wound infection which was opened up by Dr. Lindie Spruce.  Her husband is packing the wound daily. Her pain is improved and her bowels are functioning normally  Review of Systems     Objective:   Physical Exam Her wound was examined and she has some healthy granulation tissue. I repacked the wound today and this looks clean and healthy.    Assessment:      Status post exploratory laparotomy with lysis of adhesions and bowel resection-improving This patient is improving from her procedure. Her wound is healing nicely and I think this will continue to heal with the current wound care. I recommended that she continue with wet-to-dry dressing changes and I will see her back in about 3-4 weeks for repeat evaluation.      Plan:     Continue current wound care and follow up in about 3 weeks

## 2013-02-02 ENCOUNTER — Encounter: Payer: Self-pay | Admitting: Cardiology

## 2013-02-07 ENCOUNTER — Encounter: Payer: PRIVATE HEALTH INSURANCE | Admitting: Internal Medicine

## 2013-02-07 ENCOUNTER — Telehealth (INDEPENDENT_AMBULATORY_CARE_PROVIDER_SITE_OTHER): Payer: Self-pay | Admitting: General Surgery

## 2013-02-07 NOTE — Telephone Encounter (Signed)
Patient called and stated that her abdominal wall wound has closing up that the gauze that she is trying to place is not staying in and she wanted to know if she could place a band aid over the wound and I told she could

## 2013-02-12 ENCOUNTER — Telehealth (INDEPENDENT_AMBULATORY_CARE_PROVIDER_SITE_OTHER): Payer: Self-pay | Admitting: *Deleted

## 2013-02-12 NOTE — Telephone Encounter (Signed)
Olegario Messier with [facility] called to let us know that patient has refused PT at this time.  Patient is also stating that she no longer wants help with her wound care.  Olegario Messier states she will have the wound care nurse call to speak with the patient regarding this request.

## 2013-02-13 ENCOUNTER — Telehealth (INDEPENDENT_AMBULATORY_CARE_PROVIDER_SITE_OTHER): Payer: Self-pay

## 2013-02-13 NOTE — Telephone Encounter (Signed)
[name] from [facility] called stating patient is having increased pain at incision site that is radiating to her back on both sides. No n/v, fever, chills. Patient has BM today. She is currently taking pain medicine that another physician gave her to help control the pain. She wants to know if patient could have her appointment moved up. Soonest appt I could find is 5/2. Is that ok or should it be sooner? I told Corrie Dandy he was out of office today but when he returns we would call patient regarding an appointment.

## 2013-02-14 NOTE — Telephone Encounter (Signed)
Called patient and scheduled her for follow up appointment with Dr. Biagio Quint on 02/15/13 @ 10:00 am.

## 2013-02-15 ENCOUNTER — Encounter (INDEPENDENT_AMBULATORY_CARE_PROVIDER_SITE_OTHER): Payer: Self-pay | Admitting: General Surgery

## 2013-02-15 ENCOUNTER — Ambulatory Visit (INDEPENDENT_AMBULATORY_CARE_PROVIDER_SITE_OTHER): Payer: PRIVATE HEALTH INSURANCE | Admitting: General Surgery

## 2013-02-15 VITALS — BP 122/78 | HR 68 | Temp 99.0°F | Resp 20 | Ht 64.75 in | Wt 196.8 lb

## 2013-02-15 DIAGNOSIS — Z5189 Encounter for other specified aftercare: Secondary | ICD-10-CM

## 2013-02-15 DIAGNOSIS — Z4889 Encounter for other specified surgical aftercare: Secondary | ICD-10-CM

## 2013-02-15 NOTE — Progress Notes (Signed)
Subjective:     Patient ID: [name], female   DOB: 22-Oct-1951, 63 y.o.   MRN: [id number]  HPI This patient follows up for wound check status post exploratory laparotomy with small bowel resection and lysis of adhesions for small bowel obstruction about one month ago. She has been doing well and her wound is almost completely healed. She is no longer requiring any wound packing. However, about one week ago she began having some upper abdominal discomfort that radiates around to the back. She is not having any fevers or chills and her bowels are functioning normally. She just feels sore.  She has been eating a lot of fatty foods but is not sure if there is association with this. Her husband is present and insists that she has been overdoing it with the work around the house and has been doing a lot of lifting and he feels that it is from this.  Review of Systems     Objective:   Physical Exam No distress and nontoxic-appearing Her abdomen is soft with some mild right upper quadrant and epigastric tenderness. She is nondistended and her wound has completely healed there is a small area of granulation at the upper incision to which I applied some silver nitrate and this should continue to completely heal    Assessment:     Status post exploratory laparotomy  She seems to be recovering okay from her procedure but over the last week has developed some upper abdominal discomfort and I'm not sure if this is due to overexertion as her husband insists or if she is developing and other issues such as symptomatic cholelithiasis or other cause of her abdominal pain.  I dont see any evidence of any problem on exam she has no evidence of any hernia.  I recommended that she gradually worked into her activities as tolerated and if this is due to postoperative deconditioning and incisional pain then this should continue to improve with time. However if this is associated with food or continues in a recommended  that she call me back and we can set her up for further evaluation    Plan:     Gradually increase activity and she will followup with me as needed

## 2013-02-21 ENCOUNTER — Ambulatory Visit (INDEPENDENT_AMBULATORY_CARE_PROVIDER_SITE_OTHER): Payer: PRIVATE HEALTH INSURANCE | Admitting: Internal Medicine

## 2013-02-21 ENCOUNTER — Encounter: Payer: Self-pay | Admitting: Internal Medicine

## 2013-02-21 VITALS — BP 129/69 | HR 83 | Temp 98.2°F | Ht 63.5 in | Wt 199.5 lb

## 2013-02-21 DIAGNOSIS — I10 Essential (primary) hypertension: Secondary | ICD-10-CM

## 2013-02-21 DIAGNOSIS — K56609 Unspecified intestinal obstruction, unspecified as to partial versus complete obstruction: Secondary | ICD-10-CM

## 2013-02-21 DIAGNOSIS — F3289 Other specified depressive episodes: Secondary | ICD-10-CM

## 2013-02-21 DIAGNOSIS — M1990 Unspecified osteoarthritis, unspecified site: Secondary | ICD-10-CM

## 2013-02-21 DIAGNOSIS — F329 Major depressive disorder, single episode, unspecified: Secondary | ICD-10-CM

## 2013-02-21 DIAGNOSIS — E1165 Type 2 diabetes mellitus with hyperglycemia: Secondary | ICD-10-CM

## 2013-02-21 DIAGNOSIS — IMO0001 Reserved for inherently not codable concepts without codable children: Secondary | ICD-10-CM

## 2013-02-21 LAB — POCT GLYCOSYLATED HEMOGLOBIN (HGB A1C): Hemoglobin A1C: 7.3

## 2013-02-21 MED ORDER — ISOSORBIDE MONONITRATE ER 60 MG PO TB24: 60.0000 mg | ORAL_TABLET | Freq: Every day | ORAL | Status: DC

## 2013-02-21 MED ORDER — INSULIN ASPART 100 UNIT/ML ~~LOC~~ SOLN: 10.0000 [IU] | Freq: Three times a day (TID) | SUBCUTANEOUS | Status: DC

## 2013-02-21 MED ORDER — HYDROCODONE-ACETAMINOPHEN 10-325 MG PO TABS: 1.0000 | ORAL_TABLET | Freq: Four times a day (QID) | ORAL | Status: DC | PRN

## 2013-02-21 NOTE — Patient Instructions (Addendum)
General Instructions: -Today, I refilled your pain medication for your back pain - be sure to continue to have regular bowel movements (every 1-2 days) - this medication can slow down your bowels, which is important to monitor given your recent obstruction; continue to take stool softeners as needed  -Continue to keep an eye on your weight - I know we discussed that you seem to be gaining 3lbs per week, and this could be that your appetite is subtly improving (you had lost several pounds, you were 212lbs back in January)  -Your sugars look GREAT! Keep up the great work - It'll be worth it in the long run!!!!!  -Be sure to keep your appointment with Dr. Donell Beers next week.  -Continue to monitor your belly and call Dr. Delice Lesch office with updates.  Please be sure to bring all of your medications with you to every visit.  Should you have any new or worsening symptoms, please be sure to call the clinic at [phone].   Treatment Goals:  Goals (1 Years of Data) as of 02/21/13         As of Today 02/15/13 02/01/13 01/22/13 01/17/13     Blood Pressure    . Blood Pressure < 130/80  129/69 122/78 122/74 120/72 196/68     Result Component    . HEMOGLOBIN A1C < 7.0  7.3        . LDL CALC < 100            Progress Toward Treatment Goals:  Treatment Goal 02/21/2013  Hemoglobin A1C improved  Blood pressure at goal    Self Care Goals & Plans:  Self Care Goal 02/21/2013  Manage my medications take my medicines as prescribed; bring my medications to every visit; refill my medications on time  Monitor my health -  Eat healthy foods drink diet soda or water instead of juice or soda; eat more vegetables; eat fruit for snacks and desserts  Be physically active find an activity I enjoy    Home Blood Glucose Monitoring 02/21/2013  Check my blood sugar 3 times a day  When to check my blood sugar before breakfast; before lunch; before dinner     Care Management & Community Referrals:  Referral  01/04/2013  Referrals made for care management support diabetes educator

## 2013-02-21 NOTE — Assessment & Plan Note (Signed)
BP Readings from Last 3 Encounters:  02/21/13 129/69  02/15/13 122/78  02/01/13 122/74    Lab Results  Component Value Date   NA 136 01/12/2013   K 4.0 01/12/2013   CREATININE 1.16* 01/12/2013    Assessment: Blood pressure control: controlled Progress toward BP goal:  at goal  Plan: Medications:  continue current medications - amlodipine 5, imdur 60, hyzaar 100-25, toprol xl 25

## 2013-02-21 NOTE — Progress Notes (Signed)
Subjective:   Patient ID: [name] female   DOB: [birth year] 62 y.o.   MRN: [id number]  HPI: [name] is a 62 y.o. woman with history of HTN, DM, Vit D def and CAD who presents for routine follow up.  She was D/c 01/17/13 after exlap for high grade SBO. Has been following with Dr. Biagio Quint (02/15/13).  She does report mild abdominal pain and feeling that she is retaining fluid.  She has been instructed to monitor her symptoms and inform Dr. Biagio Quint, as she may require abd [place].  No fever/chills.  Having regular BMs (normal form, every 1-2d)  She continues to have difficulty sleeping taking 5mg  ambien (difficulty staying asleep); she cannot ID what wakes her up.   She reports gaining 3lbs every week for the last 3 weeks.   Regarding DM, has been eating better and sugars have been better controlled, though it has been a struggle for her.  She has stopped eating a lot of desserts, except she continues to enjoy ice cream.   Past Medical History  Diagnosis Date  . Hypertension   . Peripheral neuropathy   . Cervicalgia   . Low back pain syndrome   . Hyperlipidemia   . Vitamin D deficiency   . Vitamin B12 deficiency   . Iliotibial band syndrome   . Anemia   . Borderline personality disorder   . Nonorganic psychosis   . GERD (gastroesophageal reflux disease)   . Diabetes mellitus   . Depression   . Diabetic gastroparesis   . Sleep apnea   . CAD (coronary artery disease)     Catherization 11/18/09>nonobstructive CAD , normal LV function, EF 65%  . Health maintenance examination     Colonoscopy 2009> normal; Diabetic eye exam 12/2008. No diabetic retinopathy; Mammogram 11/10> No evidence of malignancy  . Headache   . Arthritis    Current Outpatient Prescriptions  Medication Sig Dispense Refill  . ACCU-CHEK FASTCLIX LANCETS MISC 1 each by Does not apply route 3 (three) times daily before meals. Dx code 250.00  102 each  12  . amLODipine (NORVASC) 5 MG tablet Take 1 tablet (5 mg  total) by mouth daily.  90 tablet  1  . aspirin 81 MG tablet Take 81 mg by mouth daily.        Marland Kitchen buPROPion (WELLBUTRIN XL) 300 MG 24 hr tablet Take 300 mg by mouth daily.        Marland Kitchen dexlansoprazole (DEXILANT) 60 MG capsule Take 60 mg by mouth daily.        . diclofenac sodium (VOLTAREN) 1 % GEL Apply 2 g topically 3 (three) times daily as needed.  1 Tube  0  . ferrous sulfate 325 (65 FE) MG tablet Take 1 tablet (325 mg total) by mouth daily.  30 tablet  3  . ferrous sulfate 325 (65 FE) MG tablet Take 325 mg by mouth daily with breakfast.      . FLUoxetine (PROZAC) 20 MG capsule Take 1 capsule (20 mg total) by mouth every morning.  30 capsule  3  . fluticasone (FLONASE) 50 MCG/ACT nasal spray Place 2 sprays into the nose daily.  16 g  2  . gabapentin (NEURONTIN) 800 MG tablet Take 1 tablet (800 mg total) by mouth 3 (three) times daily. Take one tablet by mouth in am and 2 tablets by mouth in pm  90 tablet  11  . glucose blood (ACCU-CHEK SMARTVIEW) test strip Use to check blood sugar before meals dx  code 250.00  100 each  12  . insulin aspart (NOVOLOG) 100 UNIT/ML injection Inject 10 Units into the skin 3 (three) times daily before meals.  10 mL  3  . insulin glargine (LANTUS SOLOSTAR) 100 UNIT/ML injection Inject 50 Units into the skin at bedtime.  3 pen  12  . Insulin Pen Needle 31G X 5 MM MISC Box of 100 - uses up to 5 times per day according to CBG  100 each  11  . isosorbide mononitrate (IMDUR) 60 MG 24 hr tablet Take 1 tablet (60 mg total) by mouth daily.  30 tablet  6  . losartan-hydrochlorothiazide (HYZAAR) 100-25 MG per tablet Take 1 tablet by mouth daily.  30 tablet  5  . metoprolol succinate (TOPROL-XL) 25 MG 24 hr tablet Take 1 tablet (25 mg total) by mouth daily.  30 tablet  6  . senna (SENOKOT) 8.6 MG tablet Take 4 tablets by mouth daily.        . simvastatin (ZOCOR) 20 MG tablet Take 1 tablet (20 mg total) by mouth at bedtime.  90 tablet  3  . zolpidem (AMBIEN) 10 MG tablet Take 0.5  tablets (5 mg total) by mouth at bedtime as needed for sleep.  30 tablet  0   No current facility-administered medications for this visit.   Family History  Problem Relation Age of Onset  . Diabetes Mother   . Hypertension Mother   . Hyperlipidemia Mother   . Arthritis Mother   . Heart disease Father   . Diabetes Sister   . Diabetes Brother   . Heart disease Brother   . Heart disease Paternal Grandmother    History   Social History  . Marital Status: Married    Spouse Name: N/A    Number of Children: N/A  . Years of Education: N/A   Social History Main Topics  . Smoking status: Former Smoker    Types: Cigarettes    Quit date: 10/25/2002  . Smokeless tobacco: None  . Alcohol Use: No  . Drug Use: No  . Sexually Active: Yes   Other Topics Concern  . None   Social History Narrative  . None   Review of Systems: Constitutional: Denies fever, chills, diaphoresis HEENT: Denies photophobia, eye pain, redness, hearing loss, ear pain, congestion, sore throat, rhinorrhea, sneezing, mouth sores, trouble swallowing, neck pain, neck stiffness and tinnitus.   Respiratory: Denies SOB, DOE, cough, chest tightness,  and wheezing.   Cardiovascular: Denies chest pain, palpitations and leg swelling.  Gastrointestinal: Denies nausea, vomiting, diarrhea, constipation, blood in stool  Genitourinary: Denies dysuria, urgency, frequency, hematuria, flank pain and difficulty urinating.  Endocrine: Denies: hot or cold intolerance, sweats, changes in hair or nails, polyuria, polydipsia. Musculoskeletal: chronic back pain.  Skin: Denies pallor, rash and wound.  Neurological: Denies dizziness, seizures, syncope, weakness, light-headedness, numbness and headaches.  Psychiatric/Behavioral: Denies suicidal ideation, +mood changes (constantly feels depressed, more weepy), No confusion  Objective:  Physical Exam: Filed Vitals:   02/21/13 1416  BP: 129/69  Pulse: 83  Temp: 98.2 F (36.8 C)   TempSrc: Oral  Height: 5' 3.5" (1.613 m)  Weight: 199 lb 8 oz (90.493 kg)  SpO2: 97%   Constitutional: Vital signs reviewed.  Patient is an obese woman in no acute distress and cooperative with exam.  Head: Normocephalic and atraumatic Mouth: no erythema or exudates, MMM Eyes: PERRL, EOMI, conjunctivae normal, No scleral icterus.  Cardiovascular: RRR, S1 normal, S2 normal, no MRG, pulses symmetric  and intact bilaterally Pulmonary/Chest: CTAB, no wheezes, rales, or rhonchi Abdominal: Soft. Mildly tender and distended with clean midline incision; bowel sounds are normal, no masses, organomegaly, or guarding present.  GU: no CVA tenderness Psychiatric: depressed mood and affect. speech and behavior is normal. Judgment and thought content normal. Cognition and memory are normal.   Assessment & Plan:  Case and care discussed with Dr. Josem Kaufmann.  Please see problem oriented charting for further details. Patient to return in 3 months for DM follow up.

## 2013-02-21 NOTE — Assessment & Plan Note (Signed)
Lab Results  Component Value Date   HGBA1C 7.3 02/21/2013   HGBA1C 7.8 11/08/2012   HGBA1C 6.9 08/16/2012     Assessment: Diabetes control: good control (HgbA1C at goal) Progress toward A1C goal:  improved  Plan: Medications:  continue current medications - lantus 50 qHS, novolog 10u TIDWC Home glucose monitoring: Frequency: 3 times a day Timing: before breakfast;before lunch;before dinner Instruction/counseling given: reminded to bring blood glucose meter & log to each visit, reminded to bring medications to each visit and discussed diet

## 2013-02-21 NOTE — Assessment & Plan Note (Signed)
Has been continuing to take Prozac & wellbutrin, and has first visit with Dr. Donell Beers next week (psych)

## 2013-02-21 NOTE — Assessment & Plan Note (Signed)
Vicodin refilled. Educated on importance of regular BMs given recent h/o SBO.

## 2013-02-21 NOTE — Assessment & Plan Note (Signed)
Follows with Dr. Biagio Quint

## 2013-02-22 NOTE — Progress Notes (Signed)
Case discussed with Dr. Sharda soon after the resident saw the patient.  We reviewed the resident's history and exam and pertinent patient test results.  I agree with the assessment, diagnosis, and plan of care documented in the resident's note. 

## 2013-02-28 ENCOUNTER — Encounter (HOSPITAL_COMMUNITY): Payer: Self-pay | Admitting: Psychiatry

## 2013-02-28 ENCOUNTER — Ambulatory Visit (INDEPENDENT_AMBULATORY_CARE_PROVIDER_SITE_OTHER): Payer: Medicaid Other | Admitting: Psychiatry

## 2013-02-28 ENCOUNTER — Telehealth (HOSPITAL_COMMUNITY): Payer: Self-pay

## 2013-02-28 DIAGNOSIS — F329 Major depressive disorder, single episode, unspecified: Secondary | ICD-10-CM

## 2013-02-28 DIAGNOSIS — F332 Major depressive disorder, recurrent severe without psychotic features: Secondary | ICD-10-CM

## 2013-02-28 MED ORDER — FLUOXETINE HCL 20 MG PO CAPS: 20.0000 mg | ORAL_CAPSULE | Freq: Every morning | ORAL | Status: DC

## 2013-02-28 MED ORDER — BUPROPION HCL ER (XL) 300 MG PO TB24: 300.0000 mg | ORAL_TABLET | Freq: Every day | ORAL | Status: DC

## 2013-02-28 NOTE — Progress Notes (Signed)
Psychiatric Assessment Adult  Patient Identification:  [name] Date of Evaluation:  02/28/2013 Chief Complaint: not happy with my last provider History of Chief Complaint:  No chief complaint on file. this patient is a 62 year old African American married mother who been treated for depression for many years. His change her care to coming to this setting because she did not fell that she was her appreciated at [facility] behavior health. She had an episode of hypoglycemia at the clinic and she did not feel that they responded to her appropriately. She also didn't like that the providers continue to rotate and change and she could never get to know anyone. While she was in therapy there but that ended. This patient has been married 4 times. Her present marriage of 17 years seems to be somewhat dysfunctional. While she does love him she has difficulty communicating and negotiating. She does trust and respect him. She spends a great deal of time with her grandson is 37 months old was actually her husband's grandson. She herself has 2 children one son who she is ensrange from her incarcerated.Her daughter who is to children's is herself medically unstable with diabetes and also is drug dependent. Her daughter is actively using drugs at this time. The patient recently has had some increased financial stress as her husband's disability check changed and it affected her check. 2 she is experiencing some financial distress. She's not had any deaths in her life in the last year or 2. The patient denies persistent daily depression. He does have some days where she is depressed but is less than half and she always enjoys a day which is with her 64-month-old grandson. The patient says she's chronically been sleeping poorly but it's not dysfunctional that she takes no naps and does not feel sleepy. Her appetite is good her energy level is reasonable and she can still think and concentrate without problems. The patient  denies worthlessness. At this time she denies being suicidal but noted is that she's made multiple suicide attempts and has been hospitalized over 6 times per psychiatric services. The patient is still able to enjoy things. She goes to church and enjoys her grandson a great deal. She also enjoys television great deal.  At this time the patient denies any alcohol use for over 10 years but 10 years ago she drank on a persistent regular basis the patient denies use of drugs. She denies ever having psychotic symptomatology. The patient denies symptoms of mania in the past. The patient denies any specific symptoms consistent with generalized anxiety disorder, panic disorder or OCD. The patient denies any physical complaints such as chest pain or shortness of breath. She denies any focal neurological deficits. The patient does describe some chronic pain that is worsened related to her GI tract. The patient is followed medically at the [facility] outpatient clinic. As noted the patient has been in a psychiatric hospital at least 6 times the last one in 2000. She receiving her psychiatric care from [facility] from Dr. Butler Denmark. She no multiple psychotropic medications many of which she cannot remember. She's been taking Wellbutrin 300 mg, Prozac 20 mg on a consistent basis for a number of years. At this time overall the patient feels stable. It is important question is around the issue of a dysfunctional marriage and the patient acknowledges this and does want to enter into therapy around this topic.  HPI Review of Systems Physical Exam  Depressive Symptoms: hopelessness,  (Hypo) Manic Symptoms:  Elevated Mood:  No Irritable Mood:  No Grandiosity:  No Distractibility:  No Labiality of Mood:  No Delusions:  No Hallucinations:  No Impulsivity:  No Sexually Inappropriate Behavior:  No Financial Extravagance:  No Flight of Ideas:  No  Anxiety Symptoms: Excessive Worry:  No Panic Symptoms:   No Agoraphobia:  No Obsessive Compulsive: No  Symptoms: None, Specific Phobias:  No Social Anxiety:  No  Psychotic Symptoms:  Hallucinations: No None Delusions:  No Paranoia:  No   Ideas of Reference:  No  PTSD Symptoms: Ever had a traumatic exposure:   Had a traumatic exposure in the last month:   Re-experiencing:   Hypervigilance:   Hyperarousal:   Avoidance:  None  Traumatic Brain Injury: No none  Past Psychiatric History: Diagnosis:major depression chronic  Hospitalizations: 6 times  Outpatient Care: at [facility]  Substance Abuse Care:   Self-Mutilation:   Suicidal Attempts: six-time  Violent Behaviors:    Past Medical History:   Past Medical History  Diagnosis Date  . Hypertension   . Peripheral neuropathy   . Cervicalgia   . Low back pain syndrome   . Hyperlipidemia   . Vitamin D deficiency   . Vitamin B12 deficiency   . Iliotibial band syndrome   . Anemia   . Borderline personality disorder   . Nonorganic psychosis   . GERD (gastroesophageal reflux disease)   . Diabetes mellitus   . Depression   . Diabetic gastroparesis   . Sleep apnea   . CAD (coronary artery disease)     Catherization 11/18/09>nonobstructive CAD , normal LV function, EF 65%  . Health maintenance examination     Colonoscopy 2009> normal; Diabetic eye exam 12/2008. No diabetic retinopathy; Mammogram 11/10> No evidence of malignancy  . Headache   . Arthritis    History of Loss of Consciousness:  No Seizure History:  No Cardiac History:  No Allergies:   Allergies  Allergen Reactions  . Indomethacin Diarrhea    Caused severe diarrhea with episodes of incontinance  . Penicillins Hives  . Cimetidine     Breast swelling  . Sulfonamide Derivatives     unknown   Current Medications:  Current Outpatient Prescriptions  Medication Sig Dispense Refill  . ACCU-CHEK FASTCLIX LANCETS MISC 1 each by Does not apply route 3 (three) times daily before meals. Dx code 250.00  102 each  12  .  amLODipine (NORVASC) 5 MG tablet Take 1 tablet (5 mg total) by mouth daily.  90 tablet  1  . aspirin 81 MG tablet Take 81 mg by mouth daily.        Marland Kitchen buPROPion (WELLBUTRIN XL) 300 MG 24 hr tablet Take 1 tablet (300 mg total) by mouth daily.  30 tablet  5  . dexlansoprazole (DEXILANT) 60 MG capsule Take 60 mg by mouth daily.        . ferrous sulfate 325 (65 FE) MG tablet Take 1 tablet (325 mg total) by mouth daily.  30 tablet  3  . ferrous sulfate 325 (65 FE) MG tablet Take 325 mg by mouth daily with breakfast.      . FLUoxetine (PROZAC) 20 MG capsule Take 1 capsule (20 mg total) by mouth every morning.  30 capsule  5  . fluticasone (FLONASE) 50 MCG/ACT nasal spray Place 2 sprays into the nose daily.  16 g  2  . gabapentin (NEURONTIN) 800 MG tablet Take 1 tablet (800 mg total) by mouth 3 (three) times daily. Take one tablet  by mouth in am and 2 tablets by mouth in pm  90 tablet  11  . glucose blood (ACCU-CHEK SMARTVIEW) test strip Use to check blood sugar before meals dx code 250.00  100 each  12  . HYDROcodone-acetaminophen (NORCO) 10-325 MG per tablet Take 1 tablet by mouth every 6 (six) hours as needed for pain.  60 tablet  5  . insulin aspart (NOVOLOG) 100 UNIT/ML injection Inject 10 Units into the skin 3 (three) times daily before meals.  10 mL  3  . insulin glargine (LANTUS SOLOSTAR) 100 UNIT/ML injection Inject 50 Units into the skin at bedtime.  3 pen  12  . Insulin Pen Needle 31G X 5 MM MISC Box of 100 - uses up to 5 times per day according to CBG  100 each  11  . isosorbide mononitrate (IMDUR) 60 MG 24 hr tablet Take 1 tablet (60 mg total) by mouth daily.  30 tablet  6  . losartan-hydrochlorothiazide (HYZAAR) 100-25 MG per tablet Take 1 tablet by mouth daily.  30 tablet  5  . metoprolol succinate (TOPROL-XL) 25 MG 24 hr tablet Take 1 tablet (25 mg total) by mouth daily.  30 tablet  6  . simvastatin (ZOCOR) 20 MG tablet Take 1 tablet (20 mg total) by mouth at bedtime.  90 tablet  3  .  zolpidem (AMBIEN) 10 MG tablet Take 0.5 tablets (5 mg total) by mouth at bedtime as needed for sleep.  30 tablet  0   No current facility-administered medications for this visit.    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 Diabetes Mother   . Hypertension Mother   . Hyperlipidemia Mother   . Arthritis Mother   . Heart disease Father   . Diabetes Sister   . Diabetes Brother   . Heart disease Brother   . Heart disease Paternal Grandmother     Mental Status Examination/Evaluation: Objective:  Appearance: Casual  Eye Contact::  Good  Speech:  Clear and Coherent  Volume:  Normal  Mood:  good  Affect:  Congruent  Thought Process:  Goal Directed  Orientation:  Full (Time, Place, and Person)  Thought Content:  WDL  Suicidal Thoughts:  No  Homicidal Thoughts:  No  Judgement:  Good  Insight:  Good  Psychomotor Activity:  Normal  Akathisia:  No  Handed:  Right  AIMS (if indicated):    Assets:  Desire for Improvement    Laboratory/X-Ray Psychological Evaluation(s)  Assessment:  Axis I: Major Depression, Recurrent  severe  AXIS I Major Depression, Recurrent severe  AXIS II Deferred  AXIS III Past Medical History  Diagnosis Date  . Hypertension   . Peripheral neuropathy   . Cervicalgia   . Low back pain syndrome   . Hyperlipidemia   . Vitamin D deficiency   . Vitamin B12 deficiency   . Iliotibial band syndrome   . Anemia   . Borderline personality disorder   . Nonorganic psychosis   . GERD (gastroesophageal reflux disease)   . Diabetes mellitus   . Depression   . Diabetic gastroparesis   . Sleep apnea   . CAD (coronary artery disease)     Catherization 11/18/09>nonobstructive CAD , normal LV function, EF 65%  . Health maintenance examination     Colonoscopy 2009> normal; Diabetic eye exam 12/2008. No diabetic retinopathy; Mammogram 11/10> No evidence of malignancy  . Headache   . Arthritis      AXIS IV economic problems  AXIS V 61-70 mild symptoms   Treatment Plan/Recommendations:  Plan of Care: at this time the patient should continue taking Wellbutrin 300 mg in the morning and Prozac 20 mg. The patient will enter into psychotherapy in this setting  Laboratory:    Psychotherapy:   Medications: Wellbutrin 300 mg, Prozac 20 mg  Routine PRN Medications:  No  Consultations:   Safety Concerns:    Other:      Lucas Mallow, MD 5/7/20142:36 PM

## 2013-03-05 ENCOUNTER — Other Ambulatory Visit: Payer: Self-pay | Admitting: Internal Medicine

## 2013-03-05 DIAGNOSIS — Z1231 Encounter for screening mammogram for malignant neoplasm of breast: Secondary | ICD-10-CM

## 2013-03-08 ENCOUNTER — Ambulatory Visit (INDEPENDENT_AMBULATORY_CARE_PROVIDER_SITE_OTHER): Payer: Medicaid Other | Admitting: General Surgery

## 2013-03-08 ENCOUNTER — Emergency Department (HOSPITAL_COMMUNITY): Payer: PRIVATE HEALTH INSURANCE

## 2013-03-08 ENCOUNTER — Encounter (INDEPENDENT_AMBULATORY_CARE_PROVIDER_SITE_OTHER): Payer: Self-pay | Admitting: General Surgery

## 2013-03-08 ENCOUNTER — Emergency Department (HOSPITAL_COMMUNITY)
Admission: EM | Admit: 2013-03-08 | Discharge: 2013-03-08 | Disposition: A | Discharge status: Home / Self Care | Payer: PRIVATE HEALTH INSURANCE | Attending: Emergency Medicine | Admitting: Emergency Medicine

## 2013-03-08 ENCOUNTER — Encounter (HOSPITAL_COMMUNITY): Payer: Self-pay

## 2013-03-08 ENCOUNTER — Encounter (INDEPENDENT_AMBULATORY_CARE_PROVIDER_SITE_OTHER): Payer: PRIVATE HEALTH INSURANCE | Admitting: General Surgery

## 2013-03-08 VITALS — BP 134/77 | HR 84 | Temp 98.0°F | Resp 16 | Ht 64.75 in | Wt 198.0 lb

## 2013-03-08 DIAGNOSIS — R1013 Epigastric pain: Secondary | ICD-10-CM | POA: Insufficient documentation

## 2013-03-08 DIAGNOSIS — Z8739 Personal history of other diseases of the musculoskeletal system and connective tissue: Secondary | ICD-10-CM | POA: Insufficient documentation

## 2013-03-08 DIAGNOSIS — I10 Essential (primary) hypertension: Secondary | ICD-10-CM | POA: Insufficient documentation

## 2013-03-08 DIAGNOSIS — Z87891 Personal history of nicotine dependence: Secondary | ICD-10-CM | POA: Insufficient documentation

## 2013-03-08 DIAGNOSIS — Z880 Allergy status to penicillin: Secondary | ICD-10-CM | POA: Insufficient documentation

## 2013-03-08 DIAGNOSIS — E1310 Other specified diabetes mellitus with ketoacidosis without coma: Secondary | ICD-10-CM | POA: Insufficient documentation

## 2013-03-08 DIAGNOSIS — Z8669 Personal history of other diseases of the nervous system and sense organs: Secondary | ICD-10-CM | POA: Insufficient documentation

## 2013-03-08 DIAGNOSIS — K219 Gastro-esophageal reflux disease without esophagitis: Secondary | ICD-10-CM | POA: Insufficient documentation

## 2013-03-08 DIAGNOSIS — Z794 Long term (current) use of insulin: Secondary | ICD-10-CM | POA: Insufficient documentation

## 2013-03-08 DIAGNOSIS — Z8639 Personal history of other endocrine, nutritional and metabolic disease: Secondary | ICD-10-CM | POA: Insufficient documentation

## 2013-03-08 DIAGNOSIS — E785 Hyperlipidemia, unspecified: Secondary | ICD-10-CM | POA: Insufficient documentation

## 2013-03-08 DIAGNOSIS — Z862 Personal history of diseases of the blood and blood-forming organs and certain disorders involving the immune mechanism: Secondary | ICD-10-CM | POA: Insufficient documentation

## 2013-03-08 DIAGNOSIS — Z79899 Other long term (current) drug therapy: Secondary | ICD-10-CM | POA: Insufficient documentation

## 2013-03-08 DIAGNOSIS — K3184 Gastroparesis: Secondary | ICD-10-CM | POA: Insufficient documentation

## 2013-03-08 DIAGNOSIS — Z4889 Encounter for other specified surgical aftercare: Secondary | ICD-10-CM

## 2013-03-08 DIAGNOSIS — I2510 Atherosclerotic heart disease of native coronary artery without angina pectoris: Secondary | ICD-10-CM | POA: Insufficient documentation

## 2013-03-08 DIAGNOSIS — Z9889 Other specified postprocedural states: Secondary | ICD-10-CM | POA: Insufficient documentation

## 2013-03-08 DIAGNOSIS — E1165 Type 2 diabetes mellitus with hyperglycemia: Secondary | ICD-10-CM

## 2013-03-08 DIAGNOSIS — E1149 Type 2 diabetes mellitus with other diabetic neurological complication: Secondary | ICD-10-CM | POA: Insufficient documentation

## 2013-03-08 DIAGNOSIS — M129 Arthropathy, unspecified: Secondary | ICD-10-CM | POA: Insufficient documentation

## 2013-03-08 DIAGNOSIS — Z5189 Encounter for other specified aftercare: Secondary | ICD-10-CM

## 2013-03-08 DIAGNOSIS — Z8659 Personal history of other mental and behavioral disorders: Secondary | ICD-10-CM | POA: Insufficient documentation

## 2013-03-08 DIAGNOSIS — Z90710 Acquired absence of both cervix and uterus: Secondary | ICD-10-CM | POA: Insufficient documentation

## 2013-03-08 LAB — CBC
HCT: 35.1 % — ABNORMAL LOW (ref 36.0–46.0)
Hemoglobin: 11.3 g/dL — ABNORMAL LOW (ref 12.0–15.0)
MCH: 25.1 pg — ABNORMAL LOW (ref 26.0–34.0)
MCHC: 32.2 g/dL (ref 30.0–36.0)
MCV: 77.8 fL — ABNORMAL LOW (ref 78.0–100.0)
Platelets: 260 K/uL (ref 150–400)
RBC: 4.51 MIL/uL (ref 3.87–5.11)
RDW: 15.8 % — ABNORMAL HIGH (ref 11.5–15.5)
WBC: 8.8 K/uL (ref 4.0–10.5)

## 2013-03-08 LAB — COMPREHENSIVE METABOLIC PANEL
ALT: 14 U/L (ref 0–35)
Alkaline Phosphatase: 83 U/L (ref 39–117)
BUN: 21 mg/dL (ref 6–23)
CO2: 26 mEq/L (ref 19–32)
Calcium: 10.3 mg/dL (ref 8.4–10.5)
GFR calc Af Amer: 40 mL/min — ABNORMAL LOW (ref >90)
GFR calc non Af Amer: 35 mL/min — ABNORMAL LOW (ref >90)
Glucose, Bld: 80 mg/dL (ref 70–99)
Sodium: 141 mEq/L (ref 135–145)

## 2013-03-08 MED ORDER — MORPHINE SULFATE 4 MG/ML IJ SOLN
4.0000 mg | Freq: Once | INTRAMUSCULAR | Status: AC
  Administered 2013-03-08: 4 mg via INTRAVENOUS
  Filled 2013-03-08: qty 1

## 2013-03-08 MED ORDER — IOHEXOL 300 MG/ML  SOLN
50.0000 mL | Freq: Once | INTRAMUSCULAR | Status: AC | PRN
  Administered 2013-03-08: 50 mL via ORAL

## 2013-03-08 MED ORDER — OXYCODONE-ACETAMINOPHEN 5-325 MG PO TABS: 1.0000 | ORAL_TABLET | ORAL | Status: DC | PRN

## 2013-03-08 MED ORDER — ONDANSETRON HCL 4 MG/2ML IJ SOLN
4.0000 mg | Freq: Once | INTRAMUSCULAR | Status: AC
  Administered 2013-03-08: 4 mg via INTRAVENOUS
  Filled 2013-03-08: qty 2

## 2013-03-08 MED ORDER — IOHEXOL 300 MG/ML  SOLN
80.0000 mL | Freq: Once | INTRAMUSCULAR | Status: AC | PRN
  Administered 2013-03-08: 80 mL via INTRAVENOUS

## 2013-03-08 MED ORDER — GI COCKTAIL ~~LOC~~
30.0000 mL | Freq: Once | ORAL | Status: AC
  Administered 2013-03-08: 30 mL via ORAL
  Filled 2013-03-08: qty 30

## 2013-03-08 NOTE — ED Notes (Signed)
Pt finished first cup of contrast; CT aware 

## 2013-03-08 NOTE — ED Notes (Signed)
Pt. Having abdominal pain and chest pain began yesterday while outside. Pain is located in her epigastric.  Last night the pain was intermittent and began to be constant today. Described as achy.   Non-radiating. Denies any n/v/d, Denies any sob  Skin is p/w/d, resp. E/u

## 2013-03-08 NOTE — ED Provider Notes (Signed)
History     CSN: [id number]  Arrival date & time 03/08/13  1439   First MD Initiated Contact with Patient 03/08/13 1523      Chief Complaint  Patient presents with  . Abdominal Pain    (Consider location/radiation/quality/duration/timing/severity/associated sxs/prior treatment) The history is provided by the patient and medical records. No language interpreter was used.    [name] is a 62 y.o. female  with a hx of HTN, small bowel obstruction (march 2013), anemia, GERD, DM, diabetic gastroparesis, CAD presents to the Emergency Department complaining of gradual, persistent, progressively worsening epigastric abdominal pain beginning February 10 2013 and intermittent at that time and then worsening yesterday afternoon becoming constant and associated with anorexia.  Pt describes the pain as aching hard, rated at an 8/10, located in the epigastrium and radiating into the chest and to the bilateral flanks.  Pt states eating increases the pain, taking the Vicodin at home helps the pain. Pt with hx of SBO surgically repaired in march 2014 by Dr Biagio Quint.  Pt denies fever, chills, nausea, vomiting, diarrhea, constipation (LBM this AM and normal without blood), SOB, weakness, syncope, dysuria, hematuria.  Pt takes dexilant for her refulx every day, but states this does not feel like reflux.  She states the pain is the same as her SBO, but located in a different place.     Past Medical History  Diagnosis Date  . Hypertension   . Peripheral neuropathy   . Cervicalgia   . Low back pain syndrome   . Hyperlipidemia   . Vitamin D deficiency   . Vitamin B12 deficiency   . Iliotibial band syndrome   . Anemia   . Borderline personality disorder   . Nonorganic psychosis   . GERD (gastroesophageal reflux disease)   . Diabetes mellitus   . Depression   . Diabetic gastroparesis   . Sleep apnea   . CAD (coronary artery disease)     Catherization 11/18/09>nonobstructive CAD , normal LV function, EF  65%  . Health maintenance examination     Colonoscopy 2009> normal; Diabetic eye exam 12/2008. No diabetic retinopathy; Mammogram 11/10> No evidence of malignancy  . Headache   . Arthritis     Past Surgical History  Procedure Laterality Date  . Abdominal hysterectomy    . Hand surgery    . Bowel resection N/A 01/10/2013    Procedure: SMALL BOWEL RESECTION;  Surgeon: Lodema Pilot, DO;  Location: [facility] OR;  Service: General;  Laterality: N/A;  . Lysis of adhesion N/A 01/10/2013    Procedure: LYSIS OF ADHESION;  Surgeon: Lodema Pilot, DO;  Location: [facility] OR;  Service: General;  Laterality: N/A;  . Laparotomy N/A 01/10/2013    Procedure: EXPLORATORY LAPAROTOMY;  Surgeon: Lodema Pilot, DO;  Location: [facility] OR;  Service: General;  Laterality: N/A;    Family History  Problem Relation Age of Onset  . Diabetes Mother   . Hypertension Mother   . Hyperlipidemia Mother   . Arthritis Mother   . Heart disease Father   . Diabetes Sister   . Diabetes Brother   . Heart disease Brother   . Heart disease Paternal Grandmother     History  Substance Use Topics  . Smoking status: Former Smoker    Types: Cigarettes    Quit date: 10/25/2002  . Smokeless tobacco: Not on file  . Alcohol Use: No    OB History   Grav Para Term Preterm Abortions TAB SAB Ect Mult Living  Review of Systems  Constitutional: Negative for fever, diaphoresis, appetite change, fatigue and unexpected weight change.  HENT: Negative for mouth sores, trouble swallowing, neck pain and neck stiffness.   Respiratory: Negative for cough, chest tightness, shortness of breath, wheezing and stridor.   Cardiovascular: Negative for chest pain and palpitations.  Gastrointestinal: Positive for abdominal pain. Negative for nausea, vomiting, diarrhea, constipation, blood in stool, abdominal distention and rectal pain.  Genitourinary: Negative for dysuria, urgency, frequency, hematuria, flank pain and difficulty urinating.    Musculoskeletal: Negative for back pain.  Skin: Negative for rash.  Neurological: Negative for weakness.  Hematological: Negative for adenopathy.  Psychiatric/Behavioral: Negative for confusion.  All other systems reviewed and are negative.    Allergies  Indomethacin; Penicillins; Cimetidine; and Sulfonamide derivatives  Home Medications   Current Outpatient Rx  Name  Route  Sig  Dispense  Refill  . amLODipine (NORVASC) 5 MG tablet   Oral   Take 1 tablet (5 mg total) by mouth daily.   90 tablet   1   . aspirin 81 MG tablet   Oral   Take 81 mg by mouth daily.           Marland Kitchen buPROPion (WELLBUTRIN XL) 300 MG 24 hr tablet   Oral   Take 1 tablet (300 mg total) by mouth daily.   30 tablet   5   . dexlansoprazole (DEXILANT) 60 MG capsule   Oral   Take 60 mg by mouth daily.           Marland Kitchen FLUoxetine (PROZAC) 20 MG capsule   Oral   Take 1 capsule (20 mg total) by mouth every morning.   30 capsule   5   . gabapentin (NEURONTIN) 800 MG tablet   Oral   Take 1 tablet (800 mg total) by mouth 3 (three) times daily. Take one tablet by mouth in am and 2 tablets by mouth in pm   90 tablet   11   . gabapentin (NEURONTIN) 800 MG tablet   Oral   Take 800-1,600 mg by mouth 2 (two) times daily. One in am and two in pm.         . HYDROcodone-acetaminophen (NORCO) 10-325 MG per tablet   Oral   Take 1 tablet by mouth every 6 (six) hours as needed for pain.   60 tablet   5   . insulin aspart (NOVOLOG) 100 UNIT/ML injection   Subcutaneous   Inject 10 Units into the skin 3 (three) times daily before meals.   10 mL   3   . insulin glargine (LANTUS SOLOSTAR) 100 UNIT/ML injection   Subcutaneous   Inject 50 Units into the skin at bedtime.   3 pen   12   . isosorbide mononitrate (IMDUR) 60 MG 24 hr tablet   Oral   Take 1 tablet (60 mg total) by mouth daily.   30 tablet   6   . losartan-hydrochlorothiazide (HYZAAR) 100-25 MG per tablet   Oral   Take 1 tablet by mouth  daily.   30 tablet   5   . metoprolol succinate (TOPROL-XL) 25 MG 24 hr tablet   Oral   Take 1 tablet (25 mg total) by mouth daily.   30 tablet   6   . zolpidem (AMBIEN) 10 MG tablet   Oral   Take 10 mg by mouth at bedtime.          Marland Kitchen ACCU-CHEK FASTCLIX LANCETS MISC   Does  not apply   1 each by Does not apply route 3 (three) times daily before meals. Dx code 250.00   102 each   12   . glucose blood (ACCU-CHEK SMARTVIEW) test strip      Use to check blood sugar before meals dx code 250.00   100 each   12   . Insulin Pen Needle 31G X 5 MM MISC      Box of 100 - uses up to 5 times per day according to CBG   100 each   11   . oxyCODONE-acetaminophen (PERCOCET/ROXICET) 5-325 MG per tablet   Oral   Take 1 tablet by mouth every 4 (four) hours as needed for pain.   11 tablet   0     BP 128/65  Pulse 68  Resp 17  SpO2 96%  Physical Exam  Nursing note and vitals reviewed. Constitutional: She appears well-developed and well-nourished.  HENT:  Head: Normocephalic and atraumatic.  Mouth/Throat: Oropharynx is clear and moist.  Eyes: Conjunctivae and EOM are normal. Pupils are equal, round, and reactive to light. No scleral icterus.  Neck: Normal range of motion.  Cardiovascular: Normal rate, regular rhythm, normal heart sounds and intact distal pulses.   No murmur heard. Pulmonary/Chest: Effort normal and breath sounds normal. No respiratory distress. She has no wheezes. She has no rales. She exhibits no tenderness.  Abdominal: Soft. She exhibits distension (mild). She exhibits no abdominal bruit and no mass. Bowel sounds are absent. There is tenderness in the right upper quadrant, epigastric area and left upper quadrant. There is guarding. There is no rigidity, no rebound and no CVA tenderness.    No audible bowel sounds  Lymphadenopathy:    She has no cervical adenopathy.  Neurological: She is alert.  Skin: Skin is warm and dry.  Psychiatric: She has a normal  mood and affect.    ED Course  Procedures (including critical care time)  Labs Reviewed  CBC - Abnormal; Notable for the following:    Hemoglobin 11.3 (*)    HCT 35.1 (*)    MCV 77.8 (*)    MCH 25.1 (*)    RDW 15.8 (*)    All other components within normal limits  COMPREHENSIVE METABOLIC PANEL - Abnormal; Notable for the following:    Creatinine, Ser 1.56 (*)    Total Bilirubin 0.2 (*)    GFR calc non Af Amer 35 (*)    GFR calc Af Amer 40 (*)    All other components within normal limits  POCT I-STAT TROPONIN I   Dg Chest 2 View  03/08/2013   *RADIOLOGY REPORT*  Clinical Data: Abdominal pain  CHEST - 2 VIEW  Comparison: 11/08/2012  Findings: The cardiomediastinal silhouette is stable.  No acute infiltrate or pleural effusion.  No pulmonary edema.  Bony thorax is unremarkable.  IMPRESSION: No active disease.  No significant change.   Original Report Authenticated By: Natasha Mead, M.D.   US Abdomen Complete  03/08/2013   *RADIOLOGY REPORT*  Clinical Data:  Epigastric pain  COMPLETE ABDOMINAL ULTRASOUND  Comparison:  01/09/2013 CT.  12/19/2009 ultrasound.  Findings:  Gallbladder:  No gallstones, gallbladder wall thickening, or pericholecystic fluid.  Common bile duct:  2.6 mm.  Liver:  No focal lesion identified.  Within normal limits in parenchymal echogenicity.  IVC:  Appears normal.  Pancreas:  Pancreatic duct 2.6 mm.  No focal mass noted.  Spleen:  6.5 cm.  No focal mass.  Right Kidney:  10.0  cm. No hydronephrosis or renal mass.  Left Kidney:  10.1 cm.  No hydronephrosis.  Sub centimeter cyst  Abdominal aorta:  Mild atherosclerotic type changes.  No aneurysmal dilation.  IMPRESSION: No acute abnormality.  Please see above.   Original Report Authenticated By: Lacy Duverney, M.D.   Ct Abdomen Pelvis W Contrast  03/08/2013   *RADIOLOGY REPORT*  Clinical Data: Epigastric pain.  CT ABDOMEN AND PELVIS WITH CONTRAST  Technique:  Multidetector CT imaging of the abdomen and pelvis was performed  following the standard protocol during bolus administration of intravenous contrast.  Contrast: 80mL OMNIPAQUE IOHEXOL 300 MG/ML  SOLN  Comparison: 01/09/2013  Findings: Linear scarring in the lung bases and the.  No effusions. Heart is borderline in size.  Liver, gallbladder, stomach, spleen, pancreas, adrenals and kidneys are normal.  Appendix is normal.  Small bowel is decompressed.  Colon grossly unremarkable.  No free fluid, free air or adenopathy.  Prior hysterectomy.  No adnexal masses.  Urinary bladder is unremarkable.  Aorta and iliac vessels are calcified, non-aneurysmal.  No acute bony abnormality.  IMPRESSION: No acute findings in the abdomen or pelvis.   Original Report Authenticated By: Charlett Nose, M.D.   Dg Abd 2 Views  03/08/2013   *RADIOLOGY REPORT*  Clinical Data: Upper abdominal pain.  Recent surgery for small bowel resection.  History of small-bowel obstruction.  ABDOMEN - 2 VIEW  Comparison: 01/13/2013.  Findings: Bowel gas pattern is nonspecific and nonobstructive. Colonic gas is present.  Gas is present and very slightly dilated loops of small intestine centrally but no air-fluid levels are seen to suggest increased fluid or obstruction.  No pneumoperitoneum is evident.  No opaque calculi are seen. Pelvic phleboliths are seen.  There is minimal degenerative spondylosis.  IMPRESSION: Bowel gas pattern is nonspecific and nonobstructive.  No pneumoperitoneum is demonstrated.  No opaque calculi are seen.   Original Report Authenticated By: Onalee Hua Call   ECG:  Date: 03/08/2013  Rate: 85  Rhythm: normal sinus rhythm  QRS Axis: normal  Intervals: normal  ST/T Wave abnormalities: normal  Conduction Disutrbances:none  Narrative Interpretation: nonischemic ECG, unchanged compared to previous on 01/09/13 (this old ECG was read as a-flutter, but is NSR)  Old EKG Reviewed: unchanged      1. Epigastric abdominal pain   2. Uncontrolled type 2 diabetes mellitus with insulin therapy   3.  GERD       MDM  Prestina Raigoza presents with epigastric abdominal pain radiating up into her chest for several days.  Patient is nontoxic, nonseptic appearing.  Patient's pain and other symptoms adequately managed in emergency department.  Labs, imaging and vitals reviewed.  I personally reviewed the imaging tests through PACS system.  I reviewed available ER/hospitalization records through the EMR.  Patient does not meet the SIRS or Sepsis criteria.  On repeat exam patient does not have a surgical abdomin and there are nor peritoneal signs.  No indication of appendicitis, bowel obstruction, bowel perforation, cholecystitis, diverticulitis.  Patient discharged home with symptomatic treatment and given strict instructions for follow-up with their primary care physician.  I have also discussed reasons to return immediately to the ER.  Patient expresses understanding and agrees with plan.           Dahlia Client Hiliana Eilts, PA-C 03/09/13 0157

## 2013-03-08 NOTE — Progress Notes (Signed)
Subjective:     Patient ID: [name], female   DOB: [birth year], 62 y.o.   MRN: [id number]  HPI This patient follows upone half months status post exposure laparotomy and bowel resection for a small bowel obstruction.  Last time that I saw her she had some abdominal pain but she says that this had resolved And she had been feeling much better.  However last night she began having some upper abdominal left upper quadrant pain and she says it has not let up since then. She says that this is different than her previous pain and she feels up into her chest as well. She hasn't been able to be but she doesn't have nausea or vomiting and hasn't had fevers or chills. She says that her abdomen is nondistended and this is different than her bowel obstruction.  Review of Systems     Objective:   Physical Exam She has some upper abdominal and epigastric and left upper quadrant tenderness to palpation do not appreciate any obvious hernia on exam. She is nondistended and has no peritoneal signs.    Assessment:     Abdominal pain and chest pain I'm not certain what is causing her pain. She has had 12 hours of continuous pain and I recommended that she go to the emergency room for further evaluation. This could be related to her gallbladder or even possibly cardiac in nature and I recommended that she go straight to emerge from for further characterization. I explained she should not eat or drink anything in case further testing or any procedures Lasix and she agreed to go to the emergency room. She seems hemodynamically stable now with a blood pressure of 134/77 and her heart rate 84 for tube called EMS but she declined and said that she would be able to manage. She is ambulatory and does not look critical.     Plan:     She will go immediately to the emergency room for further evaluation of her abdominal pain and chest pain

## 2013-03-10 NOTE — ED Provider Notes (Signed)
  Medical screening examination/treatment/procedure(s) were performed by non-physician practitioner and as supervising physician I was immediately available for consultation/collaboration.   Gerhard Munch, MD 03/10/13 [phone]

## 2013-03-12 ENCOUNTER — Encounter (HOSPITAL_COMMUNITY): Payer: Self-pay

## 2013-03-14 ENCOUNTER — Ambulatory Visit (HOSPITAL_COMMUNITY)
Admission: RE | Admit: 2013-03-14 | Discharge: 2013-03-14 | Disposition: A | Discharge status: Home / Self Care | Payer: PRIVATE HEALTH INSURANCE | Source: Ambulatory Visit | Attending: Internal Medicine | Admitting: Internal Medicine

## 2013-03-14 DIAGNOSIS — Z13820 Encounter for screening for osteoporosis: Secondary | ICD-10-CM | POA: Insufficient documentation

## 2013-03-14 DIAGNOSIS — Z1231 Encounter for screening mammogram for malignant neoplasm of breast: Secondary | ICD-10-CM | POA: Insufficient documentation

## 2013-03-14 DIAGNOSIS — Z780 Asymptomatic menopausal state: Secondary | ICD-10-CM | POA: Insufficient documentation

## 2013-03-22 ENCOUNTER — Telehealth: Payer: Self-pay | Admitting: *Deleted

## 2013-03-22 NOTE — Telephone Encounter (Signed)
I think it is reasonable to give her an appointment with a clinic doctor when available to review the pain. Patioent should also be told to come to ER if her pain becomes worse or if she starts having nausea/vomiting with pain.  Adely Facer

## 2013-03-22 NOTE — Telephone Encounter (Signed)
Pt called with c/o abd pain, onset Monday. Rates pain 10/10. Some relief when laying on left side on and with pain meds.  Pt see in ED for this on 5/15.She had CT, [place], EKG and abd films. she is to see Dr Matthias Hughs on  6/16. She is taking pain med given at ED.   Denies Nausea/vomiting.  Eating okay but pain increases with food.    She wants to come in clinic if we can help her. Please advise. Pt # [id number]

## 2013-03-23 NOTE — Telephone Encounter (Signed)
Pt given appointment for next Thursday.

## 2013-03-29 ENCOUNTER — Ambulatory Visit (INDEPENDENT_AMBULATORY_CARE_PROVIDER_SITE_OTHER): Payer: PRIVATE HEALTH INSURANCE | Admitting: Internal Medicine

## 2013-03-29 ENCOUNTER — Ambulatory Visit (INDEPENDENT_AMBULATORY_CARE_PROVIDER_SITE_OTHER): Payer: 59 | Admitting: Licensed Clinical Social Worker

## 2013-03-29 ENCOUNTER — Encounter: Payer: Self-pay | Admitting: Internal Medicine

## 2013-03-29 ENCOUNTER — Encounter (HOSPITAL_COMMUNITY): Payer: Self-pay | Admitting: Licensed Clinical Social Worker

## 2013-03-29 VITALS — BP 135/76 | HR 85 | Temp 97.7°F | Resp 20 | Ht 64.0 in | Wt 203.2 lb

## 2013-03-29 DIAGNOSIS — E1165 Type 2 diabetes mellitus with hyperglycemia: Secondary | ICD-10-CM

## 2013-03-29 DIAGNOSIS — I10 Essential (primary) hypertension: Secondary | ICD-10-CM

## 2013-03-29 DIAGNOSIS — R109 Unspecified abdominal pain: Secondary | ICD-10-CM

## 2013-03-29 DIAGNOSIS — R1013 Epigastric pain: Secondary | ICD-10-CM

## 2013-03-29 DIAGNOSIS — F332 Major depressive disorder, recurrent severe without psychotic features: Secondary | ICD-10-CM

## 2013-03-29 MED ORDER — METOCLOPRAMIDE HCL 10 MG PO TABS: 10.0000 mg | ORAL_TABLET | Freq: Three times a day (TID) | ORAL | Status: DC

## 2013-03-29 MED ORDER — METOCLOPRAMIDE HCL 5 MG PO TABS: 5.0000 mg | ORAL_TABLET | Freq: Three times a day (TID) | ORAL | Status: DC
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
## 2013-03-29 NOTE — Assessment & Plan Note (Signed)
Lab Results  Component Value Date   HGBA1C 7.3 02/21/2013   HGBA1C 7.8 11/08/2012   HGBA1C 6.9 08/16/2012     Assessment: Diabetes control: good control (HgbA1C at goal) Progress toward A1C goal:  at goal Comments: Patient does not bring her glucometer today, and A1C was checked recently, found to be 7.3  Plan: Medications:  continue current medications Home glucose monitoring: Frequency: once a day Timing: before meals Instruction/counseling given: reminded to bring blood glucose meter & log to each visit Educational resources provided: brochure Self management tools provided:   Other plans: Recheck A1C at next visit

## 2013-03-29 NOTE — Progress Notes (Addendum)
HPI The patient is a 62 y.o. female with a history of DM2, SBO, HTN, HL, CKD, CAD, presenting for a follow-up visit.  The patient notes abdominal surgery 12/2012.  The patient now notes a 59-month history of epigastric pain, described as an epigastric, "sharp, jagged" pain, which is constant.  Pain is worsened by movement, particularly walking.  Eating does not change the pain.  She occasional notes nausea, but no vomiting.  She notes no diarrhea or constipation, and she takes senokot.  Norco relieves the pain somewhat, but not fully.  She is also on dexilant.  The patient does not drink alcohol.  The patient takes a baby aspirin daily, but notes no other NSAID usage.  The patient notes significant depressed mood.  She is actively following with Behavioral Health, and recently started seeing a new therapist, and has appointments scheduled every 2 weeks for the foreseeable future.  She is taking Prozac and Bupropion.  She notes significant interpersonal stressors.  The patient has a history of DM.  She did not bring her glucometer, but notes that her blood sugars have been "up and down", from 72 to 218.  Her last A1C was 7.3 (01/2013).  The patient has a history of diabetic neuropathy.  ROS: General: no fevers, chills, changes in weight, changes in appetite Skin: no rash HEENT: no blurry vision, hearing changes, sore throat Pulm: no dyspnea, coughing, wheezing CV: no chest pain, palpitations, shortness of breath Abd: see HPI GU: no dysuria, hematuria, polyuria Ext: no arthralgias, myalgias Neuro: no weakness, numbness, or tingling  Filed Vitals:   03/29/13 1424  BP: 135/76  Pulse: 85  Temp: 97.7 F (36.5 C)  Resp: 20    PEX General: alert, cooperative, and in no apparent distress HEENT: pupils equal round and reactive to light, vision grossly intact, oropharynx clear and non-erythematous  Neck: supple, no lymphadenopathy Lungs: clear to ascultation bilaterally, normal work of  respiration, no wheezes, rales, ronchi Heart: regular rate and rhythm, no murmurs, gallops, or rubs Abdomen: soft, moderately tender to epigastric palpation, otherwise completely non-tender, no guarding, no rebound tenderness, normoactive bowel sounds Extremities: no cyanosis, clubbing, or edema Neurologic: alert & oriented X3, cranial nerves II-XII intact, strength grossly intact, sensation intact to light touch  Current Outpatient Prescriptions on File Prior to Visit  Medication Sig Dispense Refill  . ACCU-CHEK FASTCLIX LANCETS MISC 1 each by Does not apply route 3 (three) times daily before meals. Dx code 250.00  102 each  12  . amLODipine (NORVASC) 5 MG tablet Take 1 tablet (5 mg total) by mouth daily.  90 tablet  1  . aspirin 81 MG tablet Take 81 mg by mouth daily.        Marland Kitchen buPROPion (WELLBUTRIN XL) 300 MG 24 hr tablet Take 1 tablet (300 mg total) by mouth daily.  30 tablet  5  . dexlansoprazole (DEXILANT) 60 MG capsule Take 60 mg by mouth daily.        Marland Kitchen FLUoxetine (PROZAC) 20 MG capsule Take 1 capsule (20 mg total) by mouth every morning.  30 capsule  5  . gabapentin (NEURONTIN) 800 MG tablet Take 1 tablet (800 mg total) by mouth 3 (three) times daily. Take one tablet by mouth in am and 2 tablets by mouth in pm  90 tablet  11  . gabapentin (NEURONTIN) 800 MG tablet Take 800-1,600 mg by mouth 2 (two) times daily. One in am and two in pm.      . glucose blood (  ACCU-CHEK SMARTVIEW) test strip Use to check blood sugar before meals dx code 250.00  100 each  12  . HYDROcodone-acetaminophen (NORCO) 10-325 MG per tablet Take 1 tablet by mouth every 6 (six) hours as needed for pain.  60 tablet  5  . insulin aspart (NOVOLOG) 100 UNIT/ML injection Inject 10 Units into the skin 3 (three) times daily before meals.  10 mL  3  . insulin glargine (LANTUS SOLOSTAR) 100 UNIT/ML injection Inject 50 Units into the skin at bedtime.  3 pen  12  . Insulin Pen Needle 31G X 5 MM MISC Box of 100 - uses up to 5  times per day according to CBG  100 each  11  . isosorbide mononitrate (IMDUR) 60 MG 24 hr tablet Take 1 tablet (60 mg total) by mouth daily.  30 tablet  6  . losartan-hydrochlorothiazide (HYZAAR) 100-25 MG per tablet Take 1 tablet by mouth daily.  30 tablet  5  . metoprolol succinate (TOPROL-XL) 25 MG 24 hr tablet Take 1 tablet (25 mg total) by mouth daily.  30 tablet  6  . oxyCODONE-acetaminophen (PERCOCET/ROXICET) 5-325 MG per tablet Take 1 tablet by mouth every 4 (four) hours as needed for pain.  11 tablet  0  . zolpidem (AMBIEN) 10 MG tablet Take 10 mg by mouth at bedtime.        No current facility-administered medications on file prior to visit.    Assessment/Plan

## 2013-03-29 NOTE — Assessment & Plan Note (Signed)
BP Readings from Last 3 Encounters:  03/29/13 135/76  03/08/13 128/65  03/08/13 134/77    Lab Results  Component Value Date   NA 141 03/08/2013   K 4.9 03/08/2013   CREATININE 1.56* 03/08/2013    Assessment: Blood pressure control: controlled Progress toward BP goal:  at goal Comments: Well-controlled  Plan: Medications:  continue current medications Educational resources provided: brochure Self management tools provided:   Other plans: Recheck at next visit

## 2013-03-29 NOTE — Assessment & Plan Note (Addendum)
The patient notes a 76-month history of epigastric pain.  Work-up at a recent ED visit 5/15 showed normal CMP, abd CT, abd [place], and abd x-ray.  Lipase was not checked.  At this point, I believe the most likely diagnosis is diabetic gastroparesis (history of DM, known LE neuropathy, increased food consumption recently due to stress which may have exacerbated this).  Other possibilities include acute pancreatitis vs worsening GERD vs psychosomatic (increased stress recently).  Patient notes regular bowel movements, no vomiting, no concern for recurrent SBO. -check lipase -will start a trial of reglan, 5 mg TID before meals -we discussed that vicodin can worsen gastroparesis, patient encouraged to avoid this medication -patient has pre-existing follow-up with GI within the next 2 weeks -continue to follow-up with Behavioral Health

## 2013-03-29 NOTE — Patient Instructions (Signed)
General Instructions: Your abdominal pain may be due to a condition called Diabetic Gastroparesis (see below for more info).  Narcotic pain medications, such as vicodin, can often make this condition worse.  We are prescribing you a trial of a medication called Reglan.  Take 1 tablet three times per day, about 30 minutes before each meal.  Try to avoid snacking between meals.  Please follow-up with your existing appointment with Gastroenterology in 2 weeks.  Please see [place] back in clinic in 3 months.  Treatment Goals:  Goals (1 Years of Data) as of 03/29/13         As of Today 03/08/13 03/08/13 03/08/13 03/08/13     Blood Pressure    . Blood Pressure < 130/80  135/76 128/65 140/69 149/58 129/68     Result Component    . HEMOGLOBIN A1C < 7.0          . LDL CALC < 100            Progress Toward Treatment Goals:  Treatment Goal 03/29/2013  Hemoglobin A1C at goal  Blood pressure at goal    Self Care Goals & Plans:  Self Care Goal 03/29/2013  Manage my medications take my medicines as prescribed; bring my medications to every visit; refill my medications on time  Monitor my health keep track of my blood glucose; keep track of my weight  Eat healthy foods -  Be physically active find an activity I enjoy    Home Blood Glucose Monitoring 03/29/2013  Check my blood sugar once a day  When to check my blood sugar before meals     Care Management & Community Referrals:  Referral 03/29/2013  Referrals made for care management support none needed      Gastroparesis  Gastroparesis is also called slowed stomach emptying (delayed gastric emptying). It is a condition in which the stomach takes too long to empty its contents. It often happens in people with diabetes.  CAUSES  Gastroparesis happens when nerves to the stomach are damaged or stop working. When the nerves are damaged, the muscles of the stomach and intestines do not work normally. The movement of food is slowed or stopped. High blood  glucose (sugar) causes changes in nerves and can damage the blood vessels that carry oxygen and nutrients to the nerves. RISK FACTORS  Diabetes.  Post-viral syndromes.  Eating disorders (anorexia, bulimia).  Surgery on the stomach or vagus nerve.  Gastroesophageal reflux disease (rarely).  Smooth muscle disorders (amyloidosis, scleroderma).  Metabolic disorders, including hypothyroidism.  Parkinson's disease. SYMPTOMS   Heartburn.  Feeling sick to your stomach (nausea).  Vomiting of undigested food.  An early feeling of fullness when eating.  Weight loss.  Abdominal bloating.  Erratic blood glucose levels.  Lack of appetite.  Gastroesophageal reflux.  Spasms of the stomach wall. Complications can include:  Bacterial overgrowth in stomach. Food stays in the stomach and can ferment and cause bacteria to grow.  Weight loss due to difficulty digesting and absorbing nutrients.  Vomiting.  Obstruction in the stomach. Undigested food can harden and cause nausea and vomiting.  Blood glucose fluctuations caused by inconsistent food absorption. DIAGNOSIS  The diagnosis of gastroparesis is confirmed through one or more of the following tests:  Barium X-rays and scans. These tests look at how long it takes for food to move through the stomach.  Gastric manometry. This test measures electrical and muscular activity in the stomach. A thin tube is passed down the throat  into the stomach. The tube contains a wire that takes measurements of the stomach's electrical and muscular activity as it digests liquids and solid food.  Endoscopy. This procedure is done with a long, thin tube called an endoscope. It is passed through the mouth and gently guides down the esophagus into the stomach. This tube helps the caregiver look at the lining of the stomach to check for any abnormalities.  Ultrasound. This can rule out gallbladder disease or pancreatitis. This test will outline and  define the shape of the gallbladder and pancreas. TREATMENT   The primary treatment is to identify the problem and help control blood glucose levels. Treatments include:  Exercise.  Medicines to control nausea and vomiting.  Medicines to stimulate stomach muscles.  Changes in what and when you eat.  Having smaller meals more often.  Eating low-fiber forms of high-fiber foods, such aseating cooked vegetables instead of raw vegetables.  Eating low-fat foods.  Consuming liquids, which are easier to digest.  In severe cases, feeding tubes and intravenous (IV) feeding may be needed. It is important to note that in most cases, treatment does not cure gastroparesis. It is usually a lasting (chronic) condition. Treatment helps you manage the condition so that you can be as healthy and comfortable as possible. NEW TREATMENTS  A gastric neurostimulator has been developed to assist people with gastroparesis. The battery-operated device is surgically implanted. It emits mild electrical pulses to help improve stomach emptying and to control nausea and vomiting.  The use of botulinum toxin has been shown to improve stomach emptying by decreasing the prolonged contractions of the muscle between the stomach and the small intestine (pyloric sphincter). The benefits are temporary. SEEK MEDICAL CARE IF:   You are having problems keeping your blood glucose in goal range.  You are having nausea, vomiting, bloating, or early feelings of fullness with eating.  Your symptoms do not change with a change in diet. Document Released: 10/11/2005 Document Revised: 01/03/2012 Document Reviewed: 03/20/2009 [facility] Patient Information 2014 [place], [place].

## 2013-04-02 NOTE — Progress Notes (Signed)
Case discussed with Dr. Brown immediately after the resident saw the patient.  We reviewed the residens history and exam and pertinent patient test results.  I agree with the assessment, diagnosis and plan of care documented in the resident's note. 

## 2013-04-03 ENCOUNTER — Other Ambulatory Visit: Payer: Self-pay | Admitting: *Deleted

## 2013-04-03 DIAGNOSIS — I10 Essential (primary) hypertension: Secondary | ICD-10-CM

## 2013-04-03 MED ORDER — LOSARTAN POTASSIUM-HCTZ 100-25 MG PO TABS: 1.0000 | ORAL_TABLET | Freq: Every day | ORAL | Status: DC

## 2013-04-10 ENCOUNTER — Other Ambulatory Visit: Payer: Self-pay | Admitting: *Deleted

## 2013-04-11 ENCOUNTER — Other Ambulatory Visit: Payer: Self-pay | Admitting: *Deleted

## 2013-04-11 DIAGNOSIS — E119 Type 2 diabetes mellitus without complications: Secondary | ICD-10-CM

## 2013-04-12 ENCOUNTER — Emergency Department (HOSPITAL_COMMUNITY): Payer: PRIVATE HEALTH INSURANCE

## 2013-04-12 ENCOUNTER — Encounter (HOSPITAL_COMMUNITY): Payer: Self-pay | Admitting: Emergency Medicine

## 2013-04-12 ENCOUNTER — Emergency Department (HOSPITAL_COMMUNITY)
Admission: EM | Admit: 2013-04-12 | Discharge: 2013-04-12 | Disposition: A | Discharge status: Home / Self Care | Payer: PRIVATE HEALTH INSURANCE | Attending: Emergency Medicine | Admitting: Emergency Medicine

## 2013-04-12 DIAGNOSIS — Z8679 Personal history of other diseases of the circulatory system: Secondary | ICD-10-CM | POA: Insufficient documentation

## 2013-04-12 DIAGNOSIS — Z87891 Personal history of nicotine dependence: Secondary | ICD-10-CM | POA: Insufficient documentation

## 2013-04-12 DIAGNOSIS — F329 Major depressive disorder, single episode, unspecified: Secondary | ICD-10-CM | POA: Insufficient documentation

## 2013-04-12 DIAGNOSIS — Z8719 Personal history of other diseases of the digestive system: Secondary | ICD-10-CM | POA: Insufficient documentation

## 2013-04-12 DIAGNOSIS — R1013 Epigastric pain: Secondary | ICD-10-CM | POA: Insufficient documentation

## 2013-04-12 DIAGNOSIS — Z8659 Personal history of other mental and behavioral disorders: Secondary | ICD-10-CM | POA: Insufficient documentation

## 2013-04-12 DIAGNOSIS — Z8739 Personal history of other diseases of the musculoskeletal system and connective tissue: Secondary | ICD-10-CM | POA: Insufficient documentation

## 2013-04-12 DIAGNOSIS — Z7982 Long term (current) use of aspirin: Secondary | ICD-10-CM | POA: Insufficient documentation

## 2013-04-12 DIAGNOSIS — K3184 Gastroparesis: Secondary | ICD-10-CM | POA: Insufficient documentation

## 2013-04-12 DIAGNOSIS — E785 Hyperlipidemia, unspecified: Secondary | ICD-10-CM | POA: Insufficient documentation

## 2013-04-12 DIAGNOSIS — Z862 Personal history of diseases of the blood and blood-forming organs and certain disorders involving the immune mechanism: Secondary | ICD-10-CM | POA: Insufficient documentation

## 2013-04-12 DIAGNOSIS — F3289 Other specified depressive episodes: Secondary | ICD-10-CM | POA: Insufficient documentation

## 2013-04-12 DIAGNOSIS — Z8669 Personal history of other diseases of the nervous system and sense organs: Secondary | ICD-10-CM | POA: Insufficient documentation

## 2013-04-12 DIAGNOSIS — I10 Essential (primary) hypertension: Secondary | ICD-10-CM | POA: Insufficient documentation

## 2013-04-12 DIAGNOSIS — I2510 Atherosclerotic heart disease of native coronary artery without angina pectoris: Secondary | ICD-10-CM | POA: Insufficient documentation

## 2013-04-12 DIAGNOSIS — Z79899 Other long term (current) drug therapy: Secondary | ICD-10-CM | POA: Insufficient documentation

## 2013-04-12 DIAGNOSIS — Z794 Long term (current) use of insulin: Secondary | ICD-10-CM | POA: Insufficient documentation

## 2013-04-12 DIAGNOSIS — Z8639 Personal history of other endocrine, nutritional and metabolic disease: Secondary | ICD-10-CM | POA: Insufficient documentation

## 2013-04-12 DIAGNOSIS — G909 Disorder of the autonomic nervous system, unspecified: Secondary | ICD-10-CM | POA: Insufficient documentation

## 2013-04-12 DIAGNOSIS — E1149 Type 2 diabetes mellitus with other diabetic neurological complication: Secondary | ICD-10-CM | POA: Insufficient documentation

## 2013-04-12 LAB — POCT I-STAT TROPONIN I: Troponin i, poc: 0 ng/mL (ref 0.00–0.08)

## 2013-04-12 LAB — CBC
HCT: 32.5 % — ABNORMAL LOW (ref 36.0–46.0)
Hemoglobin: 10.7 g/dL — ABNORMAL LOW (ref 12.0–15.0)
MCV: 76.1 fL — ABNORMAL LOW (ref 78.0–100.0)
RDW: 15.6 % — ABNORMAL HIGH (ref 11.5–15.5)
WBC: 9 10*3/uL (ref 4.0–10.5)

## 2013-04-12 LAB — COMPREHENSIVE METABOLIC PANEL
Albumin: 3.8 g/dL (ref 3.5–5.2)
BUN: 18 mg/dL (ref 6–23)
Chloride: 100 mEq/L (ref 96–112)
Creatinine, Ser: 1.51 mg/dL — ABNORMAL HIGH (ref 0.50–1.10)
GFR calc Af Amer: 42 mL/min — ABNORMAL LOW (ref >90)
GFR calc non Af Amer: 36 mL/min — ABNORMAL LOW (ref >90)
Glucose, Bld: 86 mg/dL (ref 70–99)
Total Bilirubin: 0.2 mg/dL — ABNORMAL LOW (ref 0.3–1.2)

## 2013-04-12 LAB — LIPASE, BLOOD: Lipase: 65 U/L — ABNORMAL HIGH (ref 11–59)

## 2013-04-12 MED ORDER — GABAPENTIN 800 MG PO TABS: 800.0000 mg | ORAL_TABLET | Freq: Three times a day (TID) | ORAL | Status: DC

## 2013-04-12 MED ORDER — GI COCKTAIL ~~LOC~~
30.0000 mL | Freq: Once | ORAL | Status: AC
  Administered 2013-04-12: 30 mL via ORAL
  Filled 2013-04-12: qty 30

## 2013-04-12 MED ORDER — HYDROMORPHONE HCL PF 1 MG/ML IJ SOLN
1.0000 mg | Freq: Once | INTRAMUSCULAR | Status: AC
  Administered 2013-04-12: 1 mg via INTRAVENOUS
  Filled 2013-04-12: qty 1

## 2013-04-12 MED ORDER — SODIUM CHLORIDE 0.9 % IV SOLN
1000.0000 mL | INTRAVENOUS | Status: DC
  Administered 2013-04-12: 1000 mL via INTRAVENOUS

## 2013-04-12 MED ORDER — IOHEXOL 300 MG/ML  SOLN
100.0000 mL | Freq: Once | INTRAMUSCULAR | Status: AC | PRN
  Administered 2013-04-12: 80 mL via INTRAVENOUS

## 2013-04-12 MED ORDER — IOHEXOL 300 MG/ML  SOLN: 25.0000 mL | INTRAMUSCULAR | Status: AC

## 2013-04-12 MED ORDER — SODIUM CHLORIDE 0.9 % IV SOLN
80.0000 mg | INTRAVENOUS | Status: AC
  Administered 2013-04-12: 80 mg via INTRAVENOUS
  Filled 2013-04-12: qty 80

## 2013-04-12 MED ORDER — HYDROMORPHONE HCL PF 1 MG/ML IJ SOLN
0.5000 mg | Freq: Once | INTRAMUSCULAR | Status: AC
  Administered 2013-04-12: 0.5 mg via INTRAVENOUS
  Filled 2013-04-12: qty 1

## 2013-04-12 MED ORDER — OXYCODONE-ACETAMINOPHEN 5-325 MG PO TABS: ORAL_TABLET | ORAL | Status: DC

## 2013-04-12 MED ORDER — ASPIRIN 81 MG PO CHEW
324.0000 mg | CHEWABLE_TABLET | Freq: Once | ORAL | Status: AC
  Administered 2013-04-12: 324 mg via ORAL
  Filled 2013-04-12: qty 4

## 2013-04-12 MED ORDER — HYDROMORPHONE HCL PF 2 MG/ML IJ SOLN
2.0000 mg | Freq: Once | INTRAMUSCULAR | Status: AC
  Administered 2013-04-12: 2 mg via INTRAVENOUS
  Filled 2013-04-12: qty 1

## 2013-04-12 NOTE — ED Notes (Signed)
CT delieved oral contrast

## 2013-04-12 NOTE — ED Notes (Signed)
Pt less tearful; more pain medicine given since first dose ineffective according to pt. Protonix finished.

## 2013-04-12 NOTE — ED Notes (Signed)
CT notified pt done with contrast. 

## 2013-04-12 NOTE — ED Provider Notes (Signed)
History     CSN: [id number]  Arrival date & time 04/12/13  4098   First MD Initiated Contact with Patient 04/12/13 0935      Chief Complaint  Patient presents with  . Chest Pain    (Consider location/radiation/quality/duration/timing/severity/associated sxs/prior treatment) HPI  [name] is a 62 y.o. female with past medical history significant for insulin-dependent diabetes, diabetic gastroparesis, anemia, small bowel obstruction (requiring surgery in March of 2013) c/o epigastric and lower substernal chest pain, she's had this for the last month it has significantly worsened over the last 3 days and now radiates to the back. Associated symptoms of abdominal distention. She rates her pain at 8/10, he's been taking hydrocodone and oxycodone with little relief. Patient denies shortness of breath, nausea vomiting, and decreased by mouth intake. Patient has been moving her bowels normally, passing flatus no diarrhea or constipation. Pain has been constant, nonexertional, nonpleuritic or positional. She endorses a scratchy throat with dry cough. Pt has not received any ASA or NTG in the last 24 hours. Upper endoscopy scheduled with Dr. Matthias Hughs next week.   RF: IDDM, CAD  Cath: Cath in 2011 shows nonobstructive CAD with normal left ventricular function and EF PCP: Everardo Beals, internal medicine  Past Medical History  Diagnosis Date  . Hypertension   . Peripheral neuropathy   . Cervicalgia   . Low back pain syndrome   . Hyperlipidemia   . Vitamin D deficiency   . Vitamin B12 deficiency   . Iliotibial band syndrome   . Anemia   . Borderline personality disorder   . Nonorganic psychosis   . GERD (gastroesophageal reflux disease)   . Diabetes mellitus   . Depression   . Diabetic gastroparesis   . Sleep apnea   . CAD (coronary artery disease)     Catherization 11/18/09>nonobstructive CAD , normal LV function, EF 65%  . Health maintenance examination     Colonoscopy 2009> normal;  Diabetic eye exam 12/2008. No diabetic retinopathy; Mammogram 11/10> No evidence of malignancy  . Headache(784.0)   . Arthritis     Past Surgical History  Procedure Laterality Date  . Abdominal hysterectomy    . Hand surgery    . Bowel resection N/A 01/10/2013    Procedure: SMALL BOWEL RESECTION;  Surgeon: Lodema Pilot, DO;  Location: [facility] OR;  Service: General;  Laterality: N/A;  . Lysis of adhesion N/A 01/10/2013    Procedure: LYSIS OF ADHESION;  Surgeon: Lodema Pilot, DO;  Location: [facility] OR;  Service: General;  Laterality: N/A;  . Laparotomy N/A 01/10/2013    Procedure: EXPLORATORY LAPAROTOMY;  Surgeon: Lodema Pilot, DO;  Location: [facility] OR;  Service: General;  Laterality: N/A;    Family History  Problem Relation Age of Onset  . Diabetes Mother   . Hypertension Mother   . Hyperlipidemia Mother   . Arthritis Mother   . Depression Mother   . Heart disease Father   . Diabetes Sister   . Diabetes Brother   . Heart disease Brother   . Heart disease Paternal Grandmother   . Seizures Brother     History  Substance Use Topics  . Smoking status: Former Smoker    Types: Cigarettes    Quit date: 10/25/2002  . Smokeless tobacco: Not on file  . Alcohol Use: No    OB History   Grav Para Term Preterm Abortions TAB SAB Ect Mult Living  Review of Systems  Constitutional:       Negative except as described in HPI  HENT:       Negative except as described in HPI  Respiratory:       Negative except as described in HPI  Cardiovascular:       Negative except as described in HPI  Gastrointestinal:       Negative except as described in HPI  Genitourinary:       Negative except as described in HPI  Musculoskeletal:       Negative except as described in HPI  Skin:       Negative except as described in HPI  Neurological:       Negative except as described in HPI  All other systems reviewed and are negative.    Allergies  Indomethacin; Penicillins; Cimetidine; and  Sulfonamide derivatives  Home Medications   Current Outpatient Rx  Name  Route  Sig  Dispense  Refill  . ACCU-CHEK FASTCLIX LANCETS MISC   Does not apply   1 each by Does not apply route 3 (three) times daily before meals. Dx code 250.00   102 each   12   . amLODipine (NORVASC) 5 MG tablet   Oral   Take 1 tablet (5 mg total) by mouth daily.   90 tablet   1   . aspirin 81 MG tablet   Oral   Take 81 mg by mouth daily.           Marland Kitchen buPROPion (WELLBUTRIN XL) 300 MG 24 hr tablet   Oral   Take 1 tablet (300 mg total) by mouth daily.   30 tablet   5   . dexlansoprazole (DEXILANT) 60 MG capsule   Oral   Take 60 mg by mouth daily.           Marland Kitchen FLUoxetine (PROZAC) 20 MG capsule   Oral   Take 1 capsule (20 mg total) by mouth every morning.   30 capsule   5   . glucose blood (ACCU-CHEK SMARTVIEW) test strip      Use to check blood sugar before meals dx code 250.00   100 each   12   . HYDROcodone-acetaminophen (NORCO) 10-325 MG per tablet   Oral   Take 1 tablet by mouth every 6 (six) hours as needed for pain.   60 tablet   5   . insulin aspart (NOVOLOG) 100 UNIT/ML injection   Subcutaneous   Inject 10 Units into the skin 3 (three) times daily before meals.   10 mL   3   . insulin glargine (LANTUS SOLOSTAR) 100 UNIT/ML injection   Subcutaneous   Inject 50 Units into the skin at bedtime.   3 pen   12   . Insulin Pen Needle 31G X 5 MM MISC      Box of 100 - uses up to 5 times per day according to CBG   100 each   11   . isosorbide mononitrate (IMDUR) 60 MG 24 hr tablet   Oral   Take 1 tablet (60 mg total) by mouth daily.   30 tablet   6   . losartan-hydrochlorothiazide (HYZAAR) 100-25 MG per tablet   Oral   Take 1 tablet by mouth daily.   30 tablet   5   . metoCLOPramide (REGLAN) 5 MG tablet   Oral   Take 1 tablet (5 mg total) by mouth 3 (three) times daily.   90 tablet  0   . metoprolol succinate (TOPROL-XL) 25 MG 24 hr tablet   Oral   Take  1 tablet (25 mg total) by mouth daily.   30 tablet   6     BP 163/70  Pulse 74  Resp 18  SpO2 100%  Physical Exam  Nursing note and vitals reviewed. Constitutional: She is oriented to person, place, and time. She appears well-developed and well-nourished. No distress.  HENT:  Head: Normocephalic.  Eyes: Conjunctivae and EOM are normal. Pupils are equal, round, and reactive to light.  Cardiovascular: Normal rate.   Pulmonary/Chest: Effort normal and breath sounds normal. No stridor. No respiratory distress. She has no wheezes. She has no rales. She exhibits no tenderness.  Abdominal: Soft. Bowel sounds are normal. She exhibits no distension and no mass. There is tenderness. There is no rebound and no guarding.  To palpation epigastrium with no guarding or rebound the  Musculoskeletal: Normal range of motion.  Neurological: She is alert and oriented to person, place, and time.  Psychiatric: She has a normal mood and affect.    ED Course  Procedures (including critical care time)  Labs Reviewed  CBC - Abnormal; Notable for the following:    Hemoglobin 10.7 (*)    HCT 32.5 (*)    MCV 76.1 (*)    MCH 25.1 (*)    RDW 15.6 (*)    All other components within normal limits  COMPREHENSIVE METABOLIC PANEL - Abnormal; Notable for the following:    Creatinine, Ser 1.51 (*)    Total Bilirubin 0.2 (*)    GFR calc non Af Amer 36 (*)    GFR calc Af Amer 42 (*)    All other components within normal limits  LIPASE, BLOOD - Abnormal; Notable for the following:    Lipase 65 (*)    All other components within normal limits  POCT I-STAT TROPONIN I  POCT I-STAT TROPONIN I   Ct Abdomen Pelvis W Contrast  04/12/2013   *RADIOLOGY REPORT*  Clinical Data: Left upper quadrant pain  CT ABDOMEN AND PELVIS WITH CONTRAST  Technique:  Multidetector CT imaging of the abdomen and pelvis was performed following the standard protocol during bolus administration of intravenous contrast.  Contrast: 80mL  OMNIPAQUE IOHEXOL 300 MG/ML  SOLN  Comparison: 03/08/2013  Findings: Mild dependent atelectasis in the bilateral lower lobes.  Liver, spleen, pancreas, and adrenal glands are within normal limits.  Gallbladder is unremarkable.  No intrahepatic or extrahepatic ductal dilatation.  Kidneys are within normal limits.  No hydronephrosis.  No evidence of bowel obstruction.  Prior small bowel resection with anastomosis in the right mid abdomen (series 2/image 49).  Atherosclerotic calcifications of the abdominal aorta and branch vessels.  No abdominopelvic ascites.  No suspicious abdominopelvic lymphadenopathy.  Status post hysterectomy.  No adnexal masses.  Bladder is mildly distended but unremarkable.  Postsurgical changes in the midline anterior abdominal wall.  Mild degenerative changes of the visualized thoracolumbar spine.  IMPRESSION: Prior partial small bowel resection.  No evidence of bowel obstruction.  No CT findings to account for the patient's abdominal pain.   Original Report Authenticated By: Charline Bills, M.D.   Dg Chest Portable 1 View  04/12/2013   *RADIOLOGY REPORT*  Clinical Data: Chest pain  PORTABLE CHEST - 1 VIEW  Comparison: 03/08/2013  Findings: The heart size and mediastinal contours are within normal limits.  Both lungs are clear.  The visualized skeletal structures are unremarkable.  IMPRESSION: No active cardiopulmonary abnormalities.  Original Report Authenticated By: Signa Kell, M.D.     Date: 04/12/2013  Rate: 72  Rhythm: normal sinus rhythm  QRS Axis: normal  Intervals: normal  ST/T Wave abnormalities: normal  Conduction Disutrbances:none  Narrative Interpretation:   Old EKG Reviewed: unchanged   12:03 PM 1 mg of Dilaudid has not eased the patient's pain. I will increase to 2 mg  12:44 PM Pts pain is still severe, after 2mg  of dilaudid dropping O2, Pt placed on Nasal cannula.   1. Abdominal pain, epigastric       MDM   Filed Vitals:   04/12/13 0947  04/12/13 1000 04/12/13 1200 04/12/13 1215  BP: 163/70 149/65 156/64 140/56  Pulse: 74 78 75 72  Resp: 18 16 18 17   SpO2: 100% 97% 97% 97%     [name] is a 62 y.o. female with exacerbation of chronic epigastric pain. Only associated symptom is abdominal distention. EKG is nonischemic, Dr. troponins are negative. Blood work is essentially unremarkable. Patient has very hard to control pain and multiple doses of Dilaudid head only eased the pain in a minor way. Abdominal CT shows no acute abnormalities. Delta troponin is negative  I think a large part of this patient's issues are psychological in nature she seems to be extremely depressed. Patient denies any suicidal or homicidal ideation. She denies any prior suicide attempt.  A call to speak with internal medicine teaching service. Patient has no objective medical reason for admission at this time other than intractable pain. I have consulted internal medicine teaching service and spoken to Dr. Youlanda Roys who will arrange for an expedited outpatient appointment.   Discussed case with attending who agrees with plan and stability to d/c to home.    Medications  0.9 %  sodium chloride infusion (1,000 mLs Intravenous New Bag/Given 04/12/13 1145)  iohexol (OMNIPAQUE) 300 MG/ML solution 25 mL (not administered)  aspirin chewable tablet 324 mg (324 mg Oral Given 04/12/13 1051)  gi cocktail (Maalox,Lidocaine,Donnatal) (30 mLs Oral Given 04/12/13 1051)  HYDROmorphone (DILAUDID) injection 1 mg (1 mg Intravenous Given 04/12/13 1142)  pantoprazole (PROTONIX) 80 mg in sodium chloride 0.9 % 100 mL IVPB (80 mg Intravenous New Bag/Given 04/12/13 1200)  HYDROmorphone (DILAUDID) injection 2 mg (2 mg Intravenous Given 04/12/13 1219)    Pt is hemodynamically stable, appropriate for, and amenable to discharge at this time. Pt verbalized understanding and agrees with care plan. Outpatient follow-up and specific return precautions discussed.    New Prescriptions    OXYCODONE-ACETAMINOPHEN (PERCOCET/ROXICET) 5-325 MG PER TABLET    1 to 2 tabs PO q6hrs  PRN for pain       Wynetta Emery, PA-C 04/12/13 1604

## 2013-04-12 NOTE — ED Notes (Signed)
Pain medicine given; warm blankets given; RN actively listened to patient. PT tearful about pain. Reports she is tired of it and feeling hopeless. Denying active suicidal thoughts at the time but sees psychiatrist and has a therapist. Pt encouraged to take a nap and utilize call bell for any of her needs. RN made PA aware.

## 2013-04-12 NOTE — ED Notes (Signed)
IV Nurse now at bedside

## 2013-04-12 NOTE — ED Notes (Signed)
Pt reports chest pain off and on since January. States pain began radiating to stomach and back this AM. Pt denies SOB or dizziness at present. Alert, skin warm and dry, NAD, ekg obtained.

## 2013-04-12 NOTE — ED Notes (Signed)
Placed bedside commode to pt's bedside for use; pt using at this time

## 2013-04-12 NOTE — ED Notes (Signed)
IV team at bedside 

## 2013-04-12 NOTE — ED Notes (Signed)
Pt ambulated to restroom without distress.  

## 2013-04-12 NOTE — ED Notes (Signed)
Helped pt back to stretcher, changed pt's gown because she had gotten it wet while she used the bedside commode

## 2013-04-12 NOTE — ED Notes (Signed)
Patient transported to CT 

## 2013-04-13 ENCOUNTER — Ambulatory Visit (INDEPENDENT_AMBULATORY_CARE_PROVIDER_SITE_OTHER): Payer: PRIVATE HEALTH INSURANCE | Admitting: Internal Medicine

## 2013-04-13 ENCOUNTER — Telehealth: Payer: Self-pay | Admitting: Internal Medicine

## 2013-04-13 VITALS — BP 118/66 | HR 79 | Temp 97.9°F | Wt 209.8 lb

## 2013-04-13 DIAGNOSIS — E119 Type 2 diabetes mellitus without complications: Secondary | ICD-10-CM

## 2013-04-13 DIAGNOSIS — R1013 Epigastric pain: Secondary | ICD-10-CM

## 2013-04-13 LAB — GLUCOSE, CAPILLARY: Glucose-Capillary: 86 mg/dL (ref 70–99)

## 2013-04-13 MED ORDER — DICLOFENAC SODIUM 1 % TD GEL: 2.0000 g | Freq: Three times a day (TID) | TRANSDERMAL | Status: DC

## 2013-04-13 NOTE — ED Provider Notes (Signed)
Medical screening examination/treatment/procedure(s) were performed by non-physician practitioner and as supervising physician I was immediately available for consultation/collaboration.  Shanya Ferriss, MD 04/13/13 0850 

## 2013-04-13 NOTE — Assessment & Plan Note (Addendum)
Patient reports epigastric abdominal pain since early May (6 weeks now) which has become more constant during the past 5 days. She has previously been worked up at multiple ED visits during this time with no abnormalities noted in blood work or imaging. Current pain radiates superiorly from the xiphoid into the sternum, towards the left breast, and into the back. She is compliant with dexlansoprazole for GERD. Troponin-I and EKG 6/19 in ED were normal. Abd/Pelvis CT w/con 6/19 showed normal liver, pancreas, and gallbladder. CXR 6/19 does not demonstrate an enlarged mediastinum. Lipase 6/19 minimally elevated to 65, which is decreased from lipase of 80 on 03/29/2013.  She does have a history of SBO with surgical repair in March 2014. She has been trying reglan for the past 2 weeks with no relief of pain. Concern that this pain may have a significant psychiatric component due to the patient's recent stressors and underlying severe depression. There may also be an organic part of this disease, particularly a musculoskeletal element given tenderness to palpation across the sternum. I am also reluctant to start her on more aggressive narcotic pain management or progress towards more extensive work up given the history of this pain and previous work ups. She is already scheduled for an EGD with Dr. Matthias Hughs next Wednesday (04/18/2013).   - Offered the patient diclofenac 1% gel (Voltaren), though she claims that she has already tried this. - Continue to follow with Behavioral Health, next appointment 04/19/2013 - Follow through with EGD with Dr. Matthias Hughs on 04/18/2013.   ATTENDING A&P: We discussed that we know what this isn't and that inc 1. AMI - Trop I is negative and due to length of pain and nl EKG, AMI can be R/O 2. Inferior PNA - CXR is nl and no other sxs 3. PE - very low on diff dx 4. Pancreatitis - lipase is elevated but PO doesn't make it worse and CT was normal 5. Gall bladder dz - labs and CT nl 6.  Gastroparesis - empiric Reglan didn't help and no worse with PO 7. Hernia - not seen on CT nor exam 8. SBO - + BS and nl CT 9. Costochronitis is possible and offered topical NSAID  She will F/U with Dr Matthias Hughs for her EGD.

## 2013-04-13 NOTE — Progress Notes (Signed)
This is a Psychologist, occupational Note.  The care of the patient was discussed with Dr. Rogelia Boga and the assessment and plan was formulated with their assistance.  Please see their note for official documentation of the patient encounter.   Subjective:   Patient ID: [name] female   DOB: 04-15-1951 62 y.o.   MRN: [id number]  HPI: [name] is a 62 y.o. female with a history of SBO s/p small bowel anastomosis March 2014 and major depression presenting with chronic abdominal pain. Ms. Merlino notes that following her abdominal surgery in March 2014, she was not immediately experiencing this abdominal pain. Current pain began in May 2014 and has persisted to some degree since then as a 7/10 sharp and stabbing pain. Pain has been worse and more consistent for the past 5 days. This pain begins in the epigastric region and radiates superiorly into the sternum, into the left breast, and into her back as well. She feels that the only thing taking the edge off of her pain is the percocet that she received from the ED yesterday (6/19). Pain is made worse by moving or being active. Pain is also worse to wear tighter fitting clothing. Eating and defecation do not change pain. Pain is not worse with any particular position. Patient continues to eat and have regular bowel movements. She is passing flatus. She denies fevers, chills, shortness of breath, or palpitations.  With respect to her mood, patient feels that she is very depressed. She is experiencing a lot of stress and doesn't feel that her husband understands her current pains. Food does not taste as good to her and she has lost pleasure in previously pleasurable activities. She is frequently tearful during the interview. She denies SI/HI. She expresses that talking to behavioral health, especially Dr. Donell Beers, has helped her and she looks forward to seeing them again next week.    Past Medical History  Diagnosis Date  . Hypertension   . Peripheral  neuropathy   . Cervicalgia   . Low back pain syndrome   . Hyperlipidemia   . Vitamin D deficiency   . Vitamin B12 deficiency   . Iliotibial band syndrome   . Anemia   . Borderline personality disorder   . Nonorganic psychosis   . GERD (gastroesophageal reflux disease)   . Diabetes mellitus   . Depression   . Diabetic gastroparesis   . Sleep apnea   . CAD (coronary artery disease)     Catherization 11/18/09>nonobstructive CAD , normal LV function, EF 65%  . Health maintenance examination     Colonoscopy 2009> normal; Diabetic eye exam 12/2008. No diabetic retinopathy; Mammogram 11/10> No evidence of malignancy, DXA 03/14/13 : normal  . Headache(784.0)   . Arthritis    Current Outpatient Prescriptions  Medication Sig Dispense Refill  . aspirin 81 MG tablet Take 81 mg by mouth daily.        . Menthol, Topical Analgesic, (MINERAL ICE) 2 % GEL Apply 1 application topically as needed (pain).      Marland Kitchen metoCLOPramide (REGLAN) 5 MG tablet Take 1 tablet (5 mg total) by mouth 3 (three) times daily.  90 tablet  0  . naproxen sodium (ANAPROX) 220 MG tablet Take 440 mg by mouth as needed (pain).      Marland Kitchen oxyCODONE-acetaminophen (PERCOCET/ROXICET) 5-325 MG per tablet 1 to 2 tabs PO q6hrs  PRN for pain  15 tablet  0  . senna (SENOKOT) 8.6 MG TABS Take 4 tablets by mouth  daily as needed (constipation).      Lorenda Peck FASTCLIX LANCETS MISC 1 each by Does not apply route 3 (three) times daily before meals. Dx code 250.00  102 each  12  . amLODipine (NORVASC) 5 MG tablet Take 1 tablet (5 mg total) by mouth daily.  90 tablet  1  . buPROPion (WELLBUTRIN XL) 300 MG 24 hr tablet Take 1 tablet (300 mg total) by mouth daily.  30 tablet  5  . dexlansoprazole (DEXILANT) 60 MG capsule Take 60 mg by mouth daily.        . diclofenac sodium (VOLTAREN) 1 % GEL Apply 2 g topically 3 (three) times daily.  1 Tube  0  . Docusate Sodium (COLACE PO) Take 2 tablets by mouth daily as needed (constipation).      Marland Kitchen FLUoxetine  (PROZAC) 20 MG capsule Take 1 capsule (20 mg total) by mouth every morning.  30 capsule  5  . gabapentin (NEURONTIN) 800 MG tablet Take 800 mg by mouth 2 (two) times daily. 800 mg in the am and 1600 mg in the evening.      Marland Kitchen glucose blood (ACCU-CHEK SMARTVIEW) test strip Use to check blood sugar before meals dx code 250.00  100 each  12  . HYDROcodone-acetaminophen (NORCO) 10-325 MG per tablet Take 1 tablet by mouth every 6 (six) hours as needed for pain.  60 tablet  5  . insulin aspart (NOVOLOG) 100 UNIT/ML injection Inject 10 Units into the skin 3 (three) times daily before meals.  10 mL  3  . insulin glargine (LANTUS SOLOSTAR) 100 UNIT/ML injection Inject 50 Units into the skin at bedtime.  3 pen  12  . Insulin Pen Needle 31G X 5 MM MISC Box of 100 - uses up to 5 times per day according to CBG  100 each  11  . isosorbide mononitrate (IMDUR) 60 MG 24 hr tablet Take 1 tablet (60 mg total) by mouth daily.  30 tablet  6  . losartan-hydrochlorothiazide (HYZAAR) 100-25 MG per tablet Take 1 tablet by mouth daily.  30 tablet  5  . metoprolol succinate (TOPROL-XL) 25 MG 24 hr tablet Take 1 tablet (25 mg total) by mouth daily.  30 tablet  6   No current facility-administered medications for this visit.   Family History  Problem Relation Age of Onset  . Diabetes Mother   . Hypertension Mother   . Hyperlipidemia Mother   . Arthritis Mother   . Depression Mother   . Heart disease Father   . Diabetes Sister   . Diabetes Brother   . Heart disease Brother   . Heart disease Paternal Grandmother   . Seizures Brother    History   Social History  . Marital Status: Married    Spouse Name: N/A    Number of Children: N/A  . Years of Education: N/A   Social History Main Topics  . Smoking status: Former Smoker    Types: Cigarettes    Quit date: 10/25/2002  . Smokeless tobacco: None  . Alcohol Use: No  . Drug Use: No  . Sexually Active: Yes   Other Topics Concern  . None   Social History  Narrative  . None   Review of Systems: A comprehensive 12 point review of systems was performed and is negative except as stated above. Objective:  Physical Exam: Filed Vitals:   04/13/13 1326  BP: 118/66  Pulse: 79  Temp: 97.9 F (36.6 C)  TempSrc: Oral  Weight: 209 lb 12.8 oz (95.165 kg)  SpO2: 99%   General appearance: alert, distracted, moderate distress and tearful Head: Normocephalic, without obvious abnormality, atraumatic Eyes: PERRL, no scleral/conjunctival injection. Throat: moist mucus membranes of oropharynx. Neck: no adenopathy, no carotid bruit, supple, symmetrical, trachea midline and thyroid not enlarged, symmetric, no tenderness/mass/nodules Lungs: clear to auscultation bilaterally and normal work of breathing. Heart: regular rate and rhythm, S1, S2 normal, no murmur, click, rub or gallop Abdomen: obese, soft. tender to light palpation in epigastric region at the xiphoid. Tenderness follows superiorly into the sternum. bowel sounds normal. Extremities: extremities normal, atraumatic, no cyanosis or edema Pulses: 2+ and symmetric Assessment & Plan:  Jetta Murray is a 62 y.o. female with a history of DM, SBO s/p small bowel resection, and major depression who presents for chronic epigastric pain.  Abdominal pain, epigastric Patient reports epigastric abdominal pain since early May (6 weeks now) which has become more constant during the past 5 days. She has previously been worked up at multiple ED visits during this time with no abnormalities noted in blood work or imaging. Current pain radiates superiorly from the xiphoid into the sternum, towards the left breast, and into the back. She is compliant with dexlansoprazole for GERD. Troponin-I and EKG 6/19 in ED were normal. Abd/Pelvis CT w/con 6/19 showed normal liver, pancreas, and gallbladder. CXR 6/19 does not demonstrate an enlarged mediastinum. Lipase 6/19 minimally elevated to 65, which is decreased from lipase of 80  on 03/29/2013.  She does have a history of SBO with surgical repair in March 2014. She has been trying reglan for the past 2 weeks with no relief of pain. Concern that this pain may have a significant psychiatric component due to the patient's recent stressors and underlying severe depression. There may also be an organic part of this disease, particularly a musculoskeletal element given tenderness to palpation across the sternum. I am also reluctant to start her on more aggressive narcotic pain management or progress towards more extensive work up given the history of this pain and previous work ups. She is already scheduled for an EGD with Dr. Matthias Hughs next Wednesday (04/18/2013).   - Offered the patient diclofenac 1% gel (Voltaren), though she claims that she has already tried this. - Continue to follow with Behavioral Health, next appointment 04/19/2013 - Follow through with EGD with Dr. Matthias Hughs on 04/18/2013.     FOLLOW UP: Return if problem recurs,  worsens, or new problem develops. Follow with previous scheduled appointments with GI and Behavioral Health.

## 2013-04-13 NOTE — Telephone Encounter (Signed)
I was called by ED, Wynetta Emery, PA-C on 04/12/13, and was asked to make an appointment for [name] with our clinic. [name] is a 62 y.o. female with past medical history significant for small bowel obstruction (requiring surgery in March of 2013), who presented to ED on 04/12/13, c/o epigastric pain. Per PA physician, Wynetta Emery, patient did not need to be admitted, but needs a close follow up appointment in our clinic. I sent a message to front desk and also spoke with front desk, having asked an appointment for patient with any resident who is available on 04/13/13.  Lorretta Harp, MD PGY2, Internal Medicine Teaching Service Pager: [phone]

## 2013-04-13 NOTE — Patient Instructions (Addendum)
You were seen in clinic today for pain in your stomach and chest.  We are starting you on a new medication: diclofenac 1% gel (Voltaren). You may apply 2 grams (a little less than a dime-sized portion) of this cream to the most painful area up to three times a day.   You should continue to follow up with your other physicians. Please follow up with Dr Everardo Beals on July 30th. You may follow up with [place] sooner if the pain is not improving. If the pain is getting worse or you experience worsening symptoms, please seek medical care immediately.  Abdominal Pain Abdominal pain can be caused by many things. Your caregiver decides the seriousness of your pain by an examination and possibly blood tests and X-rays. Many cases can be observed and treated at home. Most abdominal pain is not caused by a disease and will probably improve without treatment. However, in many cases, more time must pass before a clear cause of the pain can be found. Before that point, it may not be known if you need more testing, or if hospitalization or surgery is needed. HOME CARE INSTRUCTIONS   Do not take laxatives unless directed by your caregiver.  Take pain medicine only as directed by your caregiver.  Only take over-the-counter or prescription medicines for pain, discomfort, or fever as directed by your caregiver.  Try a clear liquid diet (broth, tea, or water) for as long as directed by your caregiver. Slowly move to a bland diet as tolerated. SEEK IMMEDIATE MEDICAL CARE IF:   The pain does not go away.  You have a fever.  You keep throwing up (vomiting).  The pain is felt only in portions of the abdomen. Pain in the right side could possibly be appendicitis. In an adult, pain in the left lower portion of the abdomen could be colitis or diverticulitis.  You pass bloody or black tarry stools. MAKE SURE YOU:   Understand these instructions.  Will watch your condition.  Will get help right away if you are not  doing well or get worse. Document Released: 07/21/2005 Document Revised: 01/03/2012 Document Reviewed: 05/29/2008 [facility] Patient Information 2014 [place], [place].

## 2013-04-13 NOTE — Progress Notes (Signed)
  Subjective:    Patient ID: [name], female    DOB: 1950/11/10, 62 y.o.   MRN: [id number]  Abdominal Pain Associated symptoms include arthralgias. Pertinent negatives include no constipation, diarrhea or vomiting.    [name] is a 62 yo who presents with onset of acute ABD pain around the end of April. She had had surgical repair of an SBO in March 2014 and had recovered from that surgery so this was a new pain. It is different than her SBO pain and is different from her prior CP. It is epigastric and also substernal. IT is constant and is unrelated to PO intake and she is still eating. The pain does get worse with movement and her opioid. She has been on hydrocodone for a long time for LBP and recently oxycodone was added after her surgery. The opioid takes the edge off. It moves from epigastric and substernal to L breast and into her back. She was started on Reglan on June 6th without improvement. She denies constipation and uses Senna . She was seen in the ED last PM and a CT, Trop I, CMP, and CBC were nl. Lipase was 65. She has an EGD sch on the 23rd with Dr Matthias Hughs.  She has started counseling with Dr Donell Beers. She is frustrated that no one can find the cause of her pain. Her husband is not a great support to her.  Review of Systems  Constitutional: Negative for appetite change and unexpected weight change.  HENT: Negative for trouble swallowing.   Respiratory: Positive for chest tightness.   Cardiovascular: Positive for chest pain.  Gastrointestinal: Positive for abdominal pain. Negative for vomiting, diarrhea, constipation and abdominal distention.  Endocrine: Negative for polyphagia.  Genitourinary: Negative for pelvic pain.  Musculoskeletal: Positive for arthralgias.  Skin: Negative for rash.  Psychiatric/Behavioral: Positive for dysphoric mood.       Objective:   Physical Exam  Constitutional: She is oriented to person, place, and time. She appears well-developed and  well-nourished. No distress.  HENT:  Head: Normocephalic and atraumatic.  Right Ear: External ear normal.  Left Ear: External ear normal.  Nose: Nose normal.  Cardiovascular:  Tender to plap over sternum  Abdominal: Soft. Bowel sounds are normal. She exhibits no distension and no mass. There is tenderness in the epigastric area. There is no rigidity, no rebound, no guarding and negative Murphy's sign. No hernia.  Musculoskeletal: Normal range of motion. She exhibits no edema and no tenderness.  Neurological: She is alert and oriented to person, place, and time.  Skin: Skin is warm and dry. She is not diaphoretic. No erythema.  Psychiatric: Judgment and thought content normal. Her mood appears not anxious. Her affect is not angry, not blunt, not labile and not inappropriate. Her speech is not rapid and/or pressured and not slurred. She is withdrawn. Cognition and memory are normal. She exhibits a depressed mood. She is communicative.  Rarely made eye contact but pleasant. Tearful          Assessment & Plan:

## 2013-04-16 ENCOUNTER — Telehealth (HOSPITAL_COMMUNITY): Payer: Self-pay

## 2013-04-16 ENCOUNTER — Telehealth (HOSPITAL_COMMUNITY): Payer: Self-pay | Admitting: Licensed Clinical Social Worker

## 2013-04-16 NOTE — Telephone Encounter (Signed)
Returned patients phone call. She reports increased physical symptoms, with pain in her stomach and doctors can't find anything wrong. She questions if she is "making this up". She will have an endoscopy on Wednesday. She reports passive suicidal ideation a few days ago, but was able to redirect her thoughts. She denies any current suicidal ideation, intent or plan. Discussed a crisis plan and patient agrees to alert her family members if she feels unsafe and will go to [facility] ED. She is able to contract for safety and will see this Clinical research associate on Thursday, June 26th.

## 2013-04-19 ENCOUNTER — Ambulatory Visit (HOSPITAL_COMMUNITY): Payer: Self-pay | Admitting: Licensed Clinical Social Worker

## 2013-04-24 ENCOUNTER — Ambulatory Visit (HOSPITAL_COMMUNITY): Payer: Self-pay | Admitting: Psychiatry

## 2013-05-01 ENCOUNTER — Other Ambulatory Visit: Payer: Self-pay | Admitting: *Deleted

## 2013-05-01 DIAGNOSIS — Z794 Long term (current) use of insulin: Secondary | ICD-10-CM

## 2013-05-01 MED ORDER — GLUCOSE BLOOD VI STRP: ORAL_STRIP | Status: DC

## 2013-05-01 MED ORDER — INSULIN PEN NEEDLE 31G X 5 MM MISC: Status: DC

## 2013-05-02 ENCOUNTER — Ambulatory Visit (HOSPITAL_COMMUNITY): Payer: Self-pay | Admitting: Licensed Clinical Social Worker

## 2013-05-03 ENCOUNTER — Other Ambulatory Visit: Payer: Self-pay

## 2013-05-03 ENCOUNTER — Other Ambulatory Visit: Payer: Self-pay | Admitting: *Deleted

## 2013-05-09 ENCOUNTER — Ambulatory Visit (INDEPENDENT_AMBULATORY_CARE_PROVIDER_SITE_OTHER): Payer: PRIVATE HEALTH INSURANCE | Admitting: Internal Medicine

## 2013-05-09 ENCOUNTER — Encounter: Payer: Self-pay | Admitting: Internal Medicine

## 2013-05-09 ENCOUNTER — Ambulatory Visit (INDEPENDENT_AMBULATORY_CARE_PROVIDER_SITE_OTHER): Payer: 59 | Admitting: Psychiatry

## 2013-05-09 VITALS — BP 120/70 | HR 104 | Temp 98.7°F | Ht 65.0 in | Wt 203.6 lb

## 2013-05-09 DIAGNOSIS — D509 Iron deficiency anemia, unspecified: Secondary | ICD-10-CM

## 2013-05-09 DIAGNOSIS — G609 Hereditary and idiopathic neuropathy, unspecified: Secondary | ICD-10-CM

## 2013-05-09 DIAGNOSIS — I10 Essential (primary) hypertension: Secondary | ICD-10-CM

## 2013-05-09 DIAGNOSIS — R5381 Other malaise: Secondary | ICD-10-CM

## 2013-05-09 DIAGNOSIS — R42 Dizziness and giddiness: Secondary | ICD-10-CM

## 2013-05-09 DIAGNOSIS — I2510 Atherosclerotic heart disease of native coronary artery without angina pectoris: Secondary | ICD-10-CM

## 2013-05-09 DIAGNOSIS — G4730 Sleep apnea, unspecified: Secondary | ICD-10-CM

## 2013-05-09 DIAGNOSIS — R5383 Other fatigue: Secondary | ICD-10-CM | POA: Insufficient documentation

## 2013-05-09 DIAGNOSIS — F332 Major depressive disorder, recurrent severe without psychotic features: Secondary | ICD-10-CM

## 2013-05-09 NOTE — Progress Notes (Signed)
Patient ID: [name], female   DOB: October 23, 1951, 62 y.o.   MRN: [id number]  HPI: DianaTariya [name] is a 62 y.o. with past medical history of hypertension, hyperlipidemia, coronary artery disease, CKD, diabetes, with A1c of 7.3%, severe depression, and recent evaluation for epigastric pain.  Patient was referred from her psychiatrist for evaluation of fatigue, and position dizziness. The blood pressure checked at his psychiatrist's office was 127/74 and a pulse of 95 on sitting, 115/70, and a pulse of 105 on lying. She was therefore referred to our clinic for further evaluation and treatment.   She describes episodes of increased dizziness on standing and sitting up from a lying position. She reports that the symptoms have been present over the last 1 month, but have progressively gotten worse. She reports that she had good fluid intake today including 3 cups of coffee in the morning, diet Pepsi 12 ounces at 11 AM, she had ribs and noodles at 10:30 AM and 12 ounces of water at around 1:45 PM. Her doctor's appointment was at 2:15 PM.   She denies history of falls, visual changes or hearing problems. She denies increased palpitations, chest pain, or shortness of breath, with the symptoms of dizziness. She also feels generally fatigued. She reports numbness and tingling in her hands and feet which is chronic and she attributes this to her neuropathy. She denies symptoms of vomiting, nausea, fevers or chills.   Her abdominal pain, for which was evaluated 2 weeks ago has significantly improved. She had a EGD performed by Dr Matthias Hughs 2 weeks ago. No diarrhea or bloody stools.    She reports compliance with her medications. No recent new medication or dose changes.     Past Medical History  Diagnosis Date  . Hypertension   . Peripheral neuropathy   . Cervicalgia   . Low back pain syndrome   . Hyperlipidemia   . Vitamin D deficiency   . Vitamin B12 deficiency   . Iliotibial band syndrome   . Anemia    . Borderline personality disorder   . Nonorganic psychosis   . GERD (gastroesophageal reflux disease)   . Diabetes mellitus   . Depression   . Diabetic gastroparesis   . Sleep apnea   . CAD (coronary artery disease)     Catherization 11/18/09>nonobstructive CAD , normal LV function, EF 65%  . Health maintenance examination     Colonoscopy 2009> normal; Diabetic eye exam 12/2008. No diabetic retinopathy; Mammogram 11/10> No evidence of malignancy, DXA 03/14/13 : normal  . Headache(784.0)   . Arthritis    Current Outpatient Prescriptions  Medication Sig Dispense Refill  . ACCU-CHEK FASTCLIX LANCETS MISC 1 each by Does not apply route 3 (three) times daily before meals. Dx code 250.00  102 each  12  . aspirin 81 MG tablet Take 81 mg by mouth daily.        Marland Kitchen buPROPion (WELLBUTRIN XL) 300 MG 24 hr tablet Take 1 tablet (300 mg total) by mouth daily.  30 tablet  5  . dexlansoprazole (DEXILANT) 60 MG capsule Take 60 mg by mouth daily.        . diclofenac sodium (VOLTAREN) 1 % GEL Apply 2 g topically 3 (three) times daily.  1 Tube  0  . Docusate Sodium (COLACE PO) Take 2 tablets by mouth daily as needed (constipation).      Marland Kitchen FLUoxetine (PROZAC) 20 MG capsule Take 1 capsule (20 mg total) by mouth every morning.  30 capsule  5  .  gabapentin (NEURONTIN) 800 MG tablet Take 800 mg by mouth 2 (two) times daily. 800 mg in the am and 1600 mg in the evening.      Marland Kitchen glucose blood (ACCU-CHEK SMARTVIEW) test strip Use to check blood sugar before meals dx code 250.00  100 each  12  . insulin aspart (NOVOLOG) 100 UNIT/ML injection Inject 10 Units into the skin 3 (three) times daily before meals.  10 mL  3  . insulin glargine (LANTUS SOLOSTAR) 100 UNIT/ML injection Inject 50 Units into the skin at bedtime.  3 pen  12  . Insulin Pen Needle 31G X 5 MM MISC Box of 100 - uses up to 5 times per day according to CBG  100 each  11  . isosorbide mononitrate (IMDUR) 60 MG 24 hr tablet Take 1 tablet (60 mg total) by  mouth daily.  30 tablet  6  . losartan-hydrochlorothiazide (HYZAAR) 100-25 MG per tablet Take 1 tablet by mouth daily.  30 tablet  5  . Menthol, Topical Analgesic, (MINERAL ICE) 2 % GEL Apply 1 application topically as needed (pain).      Marland Kitchen metoCLOPramide (REGLAN) 5 MG tablet Take 1 tablet (5 mg total) by mouth 3 (three) times daily.  90 tablet  0  . metoprolol succinate (TOPROL-XL) 25 MG 24 hr tablet Take 1 tablet (25 mg total) by mouth daily.  30 tablet  6  . naproxen sodium (ANAPROX) 220 MG tablet Take 440 mg by mouth as needed (pain).      . NOVOLOG MIX 70/30 FLEXPEN (70-30) 100 UNIT/ML SUPN       . senna (SENOKOT) 8.6 MG TABS Take 4 tablets by mouth daily as needed (constipation).      . sucralfate (CARAFATE) 1 G tablet       . HYDROcodone-acetaminophen (NORCO) 10-325 MG per tablet Take 1 tablet by mouth every 6 (six) hours as needed for pain.  60 tablet  5  . oxyCODONE-acetaminophen (PERCOCET/ROXICET) 5-325 MG per tablet 1 to 2 tabs PO q6hrs  PRN for pain  15 tablet  0   No current facility-administered medications for this visit.   Family History  Problem Relation Age of Onset  . Diabetes Mother   . Hypertension Mother   . Hyperlipidemia Mother   . Arthritis Mother   . Depression Mother   . Heart disease Father   . Diabetes Sister   . Diabetes Brother   . Heart disease Brother   . Heart disease Paternal Grandmother   . Seizures Brother    History   Social History  . Marital Status: Married    Spouse Name: N/A    Number of Children: N/A  . Years of Education: N/A   Social History Main Topics  . Smoking status: Former Smoker    Types: Cigarettes    Quit date: 10/25/2002  . Smokeless tobacco: None  . Alcohol Use: No  . Drug Use: No  . Sexually Active: Yes   Other Topics Concern  . None   Social History Narrative  . None    Review of Systems: As per HPI  Objective:  Physical Exam: Filed Vitals:   05/09/13 1527 05/09/13 1541 05/09/13 1542 05/09/13 1543   BP: 118/73 110/74 120/90 120/70  Pulse: 94 93 96 104  Temp: 98.7 F (37.1 C)     TempSrc: Oral     Height: 5\' 5"  (1.651 m)     Weight: 203 lb 9.6 oz (92.352 kg)  SpO2: 100%      General: Well nourished. No acute distress.  Lungs: CTA bilaterally. Heart: RRR; no extra sounds or murmurs  Abdomen: Non-distended, normal BS, soft, nontender; no hepatosplenomegaly  Extremities: No pedal edema. No joint swelling or tenderness. Neurologic: Alert and oriented x3. No obvious neurologic deficits.   Assessment & Plan:  I have discussed my assessment and plan  with Dr. Rogelia Boga  as detailed under problem based charting.   In the brief, there number of reasons for her dizziness and fatigue, including medications ( amlodipine, Wellbutrin, Prozac, gabapentin, Imdur, losartan, and metoprolol), diabetes with autonomic dysfunction, and anemia. She is not orthostatic in the clinic. I have stopped Amlodipine. We can aim at liberal BP goal of 150/40. I have counseled extensively about being cautious when she stands up or gets up from a lying to seated position to avoid falls. I have also ordered iron/IBC and Vit B12 level. She was to Dr. Everardo Beals on 05/06/2013.

## 2013-05-09 NOTE — Progress Notes (Signed)
[facility] MD Progress Note  05/09/2013 2:40 PM [name]  MRN:  [id number] Subjective: feels weak and dizzy Diagnosis:  Axis I: Major Depression, Recurrent severe and Major Depression, single episode Today the patient is seen one time and seen alone. She's here for followup appointment. She claims that she does not feel well in the last one to 2 weeks. She physically feels much weaker and exhausted for reasons she cannot explain. She's had no new psychosocial stressors. The patient says that upon standing in the last 2 days she feels dizzy. She says has gotten worse particularly today. Today she was able to drive here without problems. He denies shortness of breath or chest pain. The patient denies daily depression. She is sleeping like usual and is eating. She denies anhedonia. She's able to think and concentrate without problems. The patient denies psychotic symptomatology. The patient still has a conflict with her husband and that is ongoing. It is noted in the patient has started in psychotherapy and see the therapist at least a few times. Unfortunately due to difficulties with scheduling she is missed 2 sessions with her therapist. Today we may contact with the [facility] and got her an appointment in there to care walk-in clinic for today. At this time I see no reason to change any of her medicines and I am concerned this may be an issue of dehydration or something related to her blood pressure medicines. Noted today puns sitting her blood pressure was 125/75 with pulse of 95 and when asked to stand her blood pressure dropped to 115/70 and her pulse increased to 105. ADL's:  Intact  Sleep: Good  Appetite:  Good  Suicidal Ideation:  no Homicidal Ideation:  no AEB (as evidenced by):  Psychiatric Specialty Exam: ROS  There were no vitals taken for this visit.There is no weight on file to calculate BMI.  General Appearance: Casual  Eye Contact::  Good  Speech:  Normal Rate   Volume:  Normal  Mood:  Depressed  Affect:  Flat  Thought Process:  Coherent  Orientation:  Full (Time, Place, and Person)  Thought Content:  WDL  Suicidal Thoughts:  No  Homicidal Thoughts:  No  Memory:  mnl  Judgement:  Good  Insight:  Good  Psychomotor Activity:  Normal  Concentration:  Good  Recall:  Good  Akathisia:  No  Handed:  Right  AIMS (if indicated):     Assets:  Desire for Improvement  Sleep:      Current Medications: Current Outpatient Prescriptions  Medication Sig Dispense Refill  . ACCU-CHEK FASTCLIX LANCETS MISC 1 each by Does not apply route 3 (three) times daily before meals. Dx code 250.00  102 each  12  . amLODipine (NORVASC) 5 MG tablet Take 1 tablet (5 mg total) by mouth daily.  90 tablet  1  . aspirin 81 MG tablet Take 81 mg by mouth daily.        Marland Kitchen buPROPion (WELLBUTRIN XL) 300 MG 24 hr tablet Take 1 tablet (300 mg total) by mouth daily.  30 tablet  5  . dexlansoprazole (DEXILANT) 60 MG capsule Take 60 mg by mouth daily.        . diclofenac sodium (VOLTAREN) 1 % GEL Apply 2 g topically 3 (three) times daily.  1 Tube  0  . Docusate Sodium (COLACE PO) Take 2 tablets by mouth daily as needed (constipation).      Marland Kitchen FLUoxetine (PROZAC) 20 MG capsule Take 1 capsule (  20 mg total) by mouth every morning.  30 capsule  5  . gabapentin (NEURONTIN) 800 MG tablet Take 800 mg by mouth 2 (two) times daily. 800 mg in the am and 1600 mg in the evening.      Marland Kitchen glucose blood (ACCU-CHEK SMARTVIEW) test strip Use to check blood sugar before meals dx code 250.00  100 each  12  . HYDROcodone-acetaminophen (NORCO) 10-325 MG per tablet Take 1 tablet by mouth every 6 (six) hours as needed for pain.  60 tablet  5  . insulin aspart (NOVOLOG) 100 UNIT/ML injection Inject 10 Units into the skin 3 (three) times daily before meals.  10 mL  3  . insulin glargine (LANTUS SOLOSTAR) 100 UNIT/ML injection Inject 50 Units into the skin at bedtime.  3 pen  12  . Insulin Pen Needle 31G X 5 MM  MISC Box of 100 - uses up to 5 times per day according to CBG  100 each  11  . isosorbide mononitrate (IMDUR) 60 MG 24 hr tablet Take 1 tablet (60 mg total) by mouth daily.  30 tablet  6  . losartan-hydrochlorothiazide (HYZAAR) 100-25 MG per tablet Take 1 tablet by mouth daily.  30 tablet  5  . Menthol, Topical Analgesic, (MINERAL ICE) 2 % GEL Apply 1 application topically as needed (pain).      Marland Kitchen metoCLOPramide (REGLAN) 5 MG tablet Take 1 tablet (5 mg total) by mouth 3 (three) times daily.  90 tablet  0  . metoprolol succinate (TOPROL-XL) 25 MG 24 hr tablet Take 1 tablet (25 mg total) by mouth daily.  30 tablet  6  . naproxen sodium (ANAPROX) 220 MG tablet Take 440 mg by mouth as needed (pain).      Marland Kitchen oxyCODONE-acetaminophen (PERCOCET/ROXICET) 5-325 MG per tablet 1 to 2 tabs PO q6hrs  PRN for pain  15 tablet  0  . senna (SENOKOT) 8.6 MG TABS Take 4 tablets by mouth daily as needed (constipation).       No current facility-administered medications for this visit.    Lab Results: No results found for this or any previous visit (from the past 48 hour(s)).  Physical Findings: AIMS:  , ,  ,  ,    CIWA:    COWS:     Treatment Plan Summary: Medication management  Plan:at this time the patient will continue taking her Wellbutrin 300 mg in her Prozac 20 mg. Today the patient will driver her to the outpatient clinic at the [facility] where she'll receive outpatient medical care. Patient given a return appointment to see me in one month.  Medical Decision Making Problem Points:  Established problem, worsening (2) Data Points:  Review of new medications or change in dosage (2)  I certify that inpatient services furnished can reasonably be expected to improve the patient's condition.   Javonte Elenes IRVING 05/09/2013, 2:40 PM

## 2013-05-09 NOTE — Assessment & Plan Note (Signed)
BP well controlled. Will set liberal goal of 150/90 in her case. Will discontinue amlodipine. We'll continue with the rest of her medications, including losartan and hydrochlorothiazide, metoprolol, and Imdur. In the future if dizziness continues, hydrochlorothiazide can be discontinued as well.

## 2013-05-09 NOTE — Assessment & Plan Note (Signed)
Orders Fe level, IBC and Vitamin B12. Will f/u with PCP

## 2013-05-09 NOTE — Patient Instructions (Signed)
Please keep yourself rehydrated  Please when standing up take a few moments before making the first step and sit back if you feel dizzy I have stopped Amlodipine  I will order some blood checks today Please see Dr Athena Masse on 05/23/2013.   Dizziness  Dizziness means you feel unsteady or lightheaded. You might feel like you are going to pass out (faint). HOME CARE   Drink enough fluids to keep your pee (urine) clear or pale yellow.  Take your medicines exactly as told by your doctor. If you take blood pressure medicine, always stand up slowly from the lying or sitting position. Hold on to something to steady yourself.  If you need to stand in one place for a long time, move your legs often. Tighten and relax your leg muscles.  Have someone stay with you until you feel okay.  Do not drive or use heavy machinery if you feel dizzy.  Do not drink alcohol.   GET HELP RIGHT AWAY IF:   You feel dizzy or lightheaded and it gets worse.  You feel sick to your stomach (nauseous), or you throw up (vomit).  You have trouble talking or walking.  You feel weak or have trouble using your arms, hands, or legs.  You cannot think clearly or have trouble forming sentences.  You have chest pain, belly (abdominal) pain, sweating, or you are short of breath.  Your vision changes.  You are bleeding.  You have problems from your medicine that seem to be getting worse. MAKE SURE YOU:   Understand these instructions.  Will watch your condition.  Will get help right away if you are not doing well or get worse. Document Released: 09/30/2011 Document Revised: 01/03/2012 Document Reviewed: 09/30/2011 [facility] Patient Information 2014 [place], [place].

## 2013-05-09 NOTE — Assessment & Plan Note (Addendum)
The etiology for this is multifactorial including medications, autonomic dysfunction from diabetes, anemia and possibly dehydration. She also has mild anemia with hemoglobin between 10 and 11. She is not orthostatic in the clinic. She reports that she gets severe pain is in her legs when she does not take gabapentin so I will keep this medication. Recent normal TSH  Plan. -Discontinue amlodipine (no mortality benefit and BP goal can be liberal). -Counseled on proper rehydration. - If symptoms persist, consider d/c HCTZ as well  -Ordered iron and total iron binding capacity, and vitamin B12 level. -Will followup with Dr. Everardo Beals in 2 weeks\

## 2013-05-10 ENCOUNTER — Other Ambulatory Visit: Payer: Self-pay | Admitting: Internal Medicine

## 2013-05-10 DIAGNOSIS — D509 Iron deficiency anemia, unspecified: Secondary | ICD-10-CM

## 2013-05-10 LAB — IRON AND TIBC
%SAT: 10 % — ABNORMAL LOW (ref 20–55)
TIBC: 424 ug/dL (ref 250–470)
UIBC: 382 ug/dL (ref 125–400)

## 2013-05-10 LAB — VITAMIN B12: Vitamin B-12: 500 pg/mL (ref 211–911)

## 2013-05-10 MED ORDER — FERROUS SULFATE 325 (65 FE) MG PO TABS: 325.0000 mg | ORAL_TABLET | Freq: Three times a day (TID) | ORAL | Status: DC

## 2013-05-10 NOTE — Progress Notes (Signed)
Case discussed with Dr.Kazibwe at the time of the visit.  We reviewed the resident's history and exam and pertinent patient test results.  I agree with the assessment, diagnosis, and plan of care documented in the resident's note.    

## 2013-05-17 ENCOUNTER — Ambulatory Visit (INDEPENDENT_AMBULATORY_CARE_PROVIDER_SITE_OTHER): Payer: 59 | Admitting: Licensed Clinical Social Worker

## 2013-05-17 DIAGNOSIS — F332 Major depressive disorder, recurrent severe without psychotic features: Secondary | ICD-10-CM

## 2013-05-17 NOTE — Progress Notes (Signed)
   THERAPIST PROGRESS NOTE  Session Time: 4:00pm-4:50pm  Participation Level: Active  Behavioral Response: Well GroomedAlertAnxious, Depressed and Irritable  Type of Therapy: Individual Therapy  Treatment Goals addressed: Coping  Interventions: CBT, Motivational Interviewing, Strength-based, Supportive and Reframing  Summary: [name] is a 62 y.o. female who presents with depressed mood and irritable affect. She reports ongoing frustration with her health. She is in chronic pain and continues to experience stomach problems. Medication given to her to help, has not helped at all. She processes her anger with her husband over how poorly he has taken care of her. She explores her difficulty forgiving him since he does not say he is sorry. She feels disrespected by him and that his actions are without compassion. She endorses anhednoia and poor motivation. Her sleep is disrupted and her appetite is wnl, but she does not eat a healthy diet.   Suicidal/Homicidal: Nowithout intent/plan  Therapist Response: Assessed patients current functioning and reviewed progress. Reviewed coping strategies. Assessed patients safety and assisted in identifying protective factors.  Reviewed crisis plan with patient. Assisted patient with the expression of frustration. Reviewed patients self care plan. Assessed progress related to self care. Patients self care is fair. Recommend daily exercise, increased socialization and recreation. Used CBT to assist patient with the identification of negative distortions and irrational thoughts. Encouraged patient to verbalize alternative and factual responses which challenge thought distortions. Used motivational interviewing to assist and encourage patient through the change process. Explored patients barriers to change.   Plan: Return again in two  weeks.  Diagnosis: Axis I: Major Depression, Recurrent severe    Axis II: No diagnosis    Giovanie Lefebre,  LCSW 05/17/2013

## 2013-05-23 ENCOUNTER — Ambulatory Visit (INDEPENDENT_AMBULATORY_CARE_PROVIDER_SITE_OTHER): Payer: PRIVATE HEALTH INSURANCE | Admitting: Internal Medicine

## 2013-05-23 ENCOUNTER — Encounter: Payer: Self-pay | Admitting: Internal Medicine

## 2013-05-23 VITALS — BP 122/67 | HR 73 | Temp 99.1°F | Wt 206.4 lb

## 2013-05-23 DIAGNOSIS — M47817 Spondylosis without myelopathy or radiculopathy, lumbosacral region: Secondary | ICD-10-CM

## 2013-05-23 DIAGNOSIS — IMO0001 Reserved for inherently not codable concepts without codable children: Secondary | ICD-10-CM

## 2013-05-23 DIAGNOSIS — R609 Edema, unspecified: Secondary | ICD-10-CM

## 2013-05-23 DIAGNOSIS — E1165 Type 2 diabetes mellitus with hyperglycemia: Secondary | ICD-10-CM

## 2013-05-23 DIAGNOSIS — IMO0002 Reserved for concepts with insufficient information to code with codable children: Secondary | ICD-10-CM

## 2013-05-23 DIAGNOSIS — M1990 Unspecified osteoarthritis, unspecified site: Secondary | ICD-10-CM

## 2013-05-23 DIAGNOSIS — R5381 Other malaise: Secondary | ICD-10-CM

## 2013-05-23 DIAGNOSIS — M47816 Spondylosis without myelopathy or radiculopathy, lumbar region: Secondary | ICD-10-CM

## 2013-05-23 DIAGNOSIS — K589 Irritable bowel syndrome without diarrhea: Secondary | ICD-10-CM

## 2013-05-23 DIAGNOSIS — I10 Essential (primary) hypertension: Secondary | ICD-10-CM

## 2013-05-23 DIAGNOSIS — R5383 Other fatigue: Secondary | ICD-10-CM

## 2013-05-23 LAB — GLUCOSE, CAPILLARY: Glucose-Capillary: 74 mg/dL (ref 70–99)

## 2013-05-23 LAB — POCT GLYCOSYLATED HEMOGLOBIN (HGB A1C): Hemoglobin A1C: 7.2

## 2013-05-23 NOTE — Patient Instructions (Addendum)
General Instructions: -Regarding your back pain, let's repeat a lumbar X-ray -Regarding your swelling, let's get an ultrasound of your heart -Keep up the great work with your blood sugar!  Your goal is to keep your blood sugar between 80-170  Please be sure to bring all of your medications with you to every visit.  Should you have any new or worsening symptoms, please be sure to call the clinic at [phone].    Treatment Goals:  Goals (1 Years of Data) as of 05/23/13         As of Today 05/09/13 05/09/13 05/09/13 05/09/13     Blood Pressure    . Blood Pressure < 150/90  122/67 120/70 120/90 110/74 118/73     Result Component    . HEMOGLOBIN A1C < 7.0  7.2        . LDL CALC < 100            Progress Toward Treatment Goals:  Treatment Goal 05/23/2013  Hemoglobin A1C -  Blood pressure improved    Self Care Goals & Plans:  Self Care Goal 05/23/2013  Manage my medications take my medicines as prescribed  Monitor my health bring my glucose meter and log to each visit; keep track of my weight  Eat healthy foods drink diet soda or water instead of juice or soda; eat smaller portions  Be physically active find an activity I enjoy  Meeting treatment goals maintain the current self-care plan    Home Blood Glucose Monitoring 05/23/2013  Check my blood sugar 3 times a day  When to check my blood sugar before breakfast; before lunch; at bedtime     Care Management & Community Referrals:  Referral 05/23/2013  Referrals made for care management support none needed

## 2013-05-23 NOTE — Progress Notes (Signed)
Subjective:   Patient ID: [name] female   DOB: 11/03/1950 62 y.o.   MRN: [id number]  HPI: [name] is a 62 y.o. woman with DM, GERD & HTN who presents for routine follow up.  Has been feeling swollen everywhere, gained 2 lbs since Monday, noticed increased leg swelling; has always used 2 pillows to sleep at night, no c/o SOB except for after shower (feels exhausted) - has been feeling exhausted a lot lately, but hasn't taken a lot of time to really rest (has been on the go, trying to help a friend, running errands). Swelling worsens through the day. Feels it all over, not specific to joints.  Cannot tell DOE because avoids stairs d/t knees.   Notes that hasn't been feeling dizzy (as described at last clinic visit), but "woozy" and legs feel weak   Low back pain has been more painful recently; interfering with daily chores, not any better than before but not frankly worsening (stopped doing a lot of stuff); No bowel or urinary incontinence. Has chronic numbness in toes, but no shooting pain, will have strain in thighs.   Past Medical History  Diagnosis Date  . Hypertension   . Peripheral neuropathy   . Cervicalgia   . Low back pain syndrome   . Hyperlipidemia   . Vitamin D deficiency   . Vitamin B12 deficiency   . Iliotibial band syndrome   . Anemia   . Borderline personality disorder   . Nonorganic psychosis   . GERD (gastroesophageal reflux disease)   . Diabetes mellitus   . Depression   . Diabetic gastroparesis   . Sleep apnea   . CAD (coronary artery disease)     Catherization 11/18/09>nonobstructive CAD , normal LV function, EF 65%  . Health maintenance examination     Colonoscopy 2009> normal; Diabetic eye exam 12/2008. No diabetic retinopathy; Mammogram 11/10> No evidence of malignancy, DXA 03/14/13 : normal  . Headache(784.0)   . Arthritis    Current Outpatient Prescriptions  Medication Sig Dispense Refill  . ACCU-CHEK FASTCLIX LANCETS MISC 1 each by Does  not apply route 3 (three) times daily before meals. Dx code 250.00  102 each  12  . aspirin 81 MG tablet Take 81 mg by mouth daily.        Marland Kitchen buPROPion (WELLBUTRIN XL) 300 MG 24 hr tablet Take 1 tablet (300 mg total) by mouth daily.  30 tablet  5  . dexlansoprazole (DEXILANT) 60 MG capsule Take 60 mg by mouth daily.        . diclofenac sodium (VOLTAREN) 1 % GEL Apply 2 g topically 3 (three) times daily.  1 Tube  0  . Docusate Sodium (COLACE PO) Take 2 tablets by mouth daily as needed (constipation).      . ferrous sulfate 325 (65 FE) MG tablet Take 1 tablet (325 mg total) by mouth 3 (three) times daily with meals.  30 tablet  3  . FLUoxetine (PROZAC) 20 MG capsule Take 1 capsule (20 mg total) by mouth every morning.  30 capsule  5  . gabapentin (NEURONTIN) 800 MG tablet Take 800 mg by mouth 2 (two) times daily. 800 mg in the am and 1600 mg in the evening.      Marland Kitchen glucose blood (ACCU-CHEK SMARTVIEW) test strip Use to check blood sugar before meals dx code 250.00  100 each  12  . HYDROcodone-acetaminophen (NORCO) 10-325 MG per tablet Take 1 tablet by mouth every 6 (six) hours as  needed for pain.  60 tablet  5  . insulin aspart (NOVOLOG) 100 UNIT/ML injection Inject 10 Units into the skin 3 (three) times daily before meals.  10 mL  3  . insulin glargine (LANTUS SOLOSTAR) 100 UNIT/ML injection Inject 50 Units into the skin at bedtime.  3 pen  12  . Insulin Pen Needle 31G X 5 MM MISC Box of 100 - uses up to 5 times per day according to CBG  100 each  11  . isosorbide mononitrate (IMDUR) 60 MG 24 hr tablet Take 1 tablet (60 mg total) by mouth daily.  30 tablet  6  . losartan-hydrochlorothiazide (HYZAAR) 100-25 MG per tablet Take 1 tablet by mouth daily.  30 tablet  5  . Menthol, Topical Analgesic, (MINERAL ICE) 2 % GEL Apply 1 application topically as needed (pain).      Marland Kitchen metoCLOPramide (REGLAN) 5 MG tablet Take 1 tablet (5 mg total) by mouth 3 (three) times daily.  90 tablet  0  . metoprolol succinate  (TOPROL-XL) 25 MG 24 hr tablet Take 1 tablet (25 mg total) by mouth daily.  30 tablet  6  . naproxen sodium (ANAPROX) 220 MG tablet Take 440 mg by mouth as needed (pain).      . NOVOLOG MIX 70/30 FLEXPEN (70-30) 100 UNIT/ML SUPN       . oxyCODONE-acetaminophen (PERCOCET/ROXICET) 5-325 MG per tablet 1 to 2 tabs PO q6hrs  PRN for pain  15 tablet  0  . senna (SENOKOT) 8.6 MG TABS Take 4 tablets by mouth daily as needed (constipation).      . sucralfate (CARAFATE) 1 G tablet        No current facility-administered medications for this visit.   Family History  Problem Relation Age of Onset  . Diabetes Mother   . Hypertension Mother   . Hyperlipidemia Mother   . Arthritis Mother   . Depression Mother   . Heart disease Father   . Diabetes Sister   . Diabetes Brother   . Heart disease Brother   . Heart disease Paternal Grandmother   . Seizures Brother    History   Social History  . Marital Status: Married    Spouse Name: N/A    Number of Children: N/A  . Years of Education: N/A   Social History Main Topics  . Smoking status: Former Smoker    Types: Cigarettes    Quit date: 10/25/2002  . Smokeless tobacco: None  . Alcohol Use: No  . Drug Use: No  . Sexually Active: Yes   Other Topics Concern  . None   Social History Narrative  . None    Objective:  Physical Exam: Filed Vitals:   05/23/13 1403  BP: 122/67  Pulse: 73  Temp: 99.1 F (37.3 C)  TempSrc: Oral  Weight: 206 lb 6.4 oz (93.622 kg)  SpO2: 97%   General:no acute distress, appears as stated age HEENT: PERRL, EOMI, no scleral icterus Cardiac: RRR, no rubs, murmurs or gallops Pulm: clear to auscultation bilaterally, moving normal volumes of air Abd: soft, nontender, nondistended, BS normoactive Ext: warm and well perfused, trace b/l pitting edema Neuro: alert and oriented X3, cranial nerves II-XII grossly intact Back: paraspinal lumbar muscular tension, no point tenderness, negative straight  leg  Assessment & Plan:   Case and care discussed with Dr. Meredith Pel.  Please see problem oriented charting for further details. Patient to return in 3 months for DM follow up.

## 2013-05-24 ENCOUNTER — Ambulatory Visit (HOSPITAL_COMMUNITY)
Admission: RE | Admit: 2013-05-24 | Discharge: 2013-05-24 | Disposition: A | Discharge status: Home / Self Care | Payer: PRIVATE HEALTH INSURANCE | Source: Ambulatory Visit | Attending: Internal Medicine | Admitting: Internal Medicine

## 2013-05-24 ENCOUNTER — Encounter: Payer: Self-pay | Admitting: Internal Medicine

## 2013-05-24 DIAGNOSIS — M545 Low back pain, unspecified: Secondary | ICD-10-CM | POA: Insufficient documentation

## 2013-05-24 DIAGNOSIS — M47816 Spondylosis without myelopathy or radiculopathy, lumbar region: Secondary | ICD-10-CM

## 2013-05-24 DIAGNOSIS — M47817 Spondylosis without myelopathy or radiculopathy, lumbosacral region: Secondary | ICD-10-CM | POA: Insufficient documentation

## 2013-05-24 NOTE — Assessment & Plan Note (Signed)
Lab Results  Component Value Date   HGBA1C 7.2 05/23/2013   HGBA1C 7.3 02/21/2013   HGBA1C 7.8 11/08/2012     Assessment: Diabetes control: good control (HgbA1C at goal) Progress toward A1C goal:   improved  Plan: Medications:  continue current medications Home glucose monitoring: Frequency: 3 times a day Timing: before breakfast;before lunch;at bedtime Instruction/counseling given: reminded to bring blood glucose meter & log to each visit and reminded to bring medications to each visit

## 2013-05-24 NOTE — Assessment & Plan Note (Signed)
Worsening pain likely secondary to d/c narcotics, but will acquire lumbar X-ray to r/o compression fracture. 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

## 2013-05-24 NOTE — Assessment & Plan Note (Addendum)
BP Readings from Last 3 Encounters:  05/23/13 122/67  05/09/13 120/70  04/13/13 118/66    Lab Results  Component Value Date   NA 135 04/12/2013   K 3.7 04/12/2013   CREATININE 1.51* 04/12/2013    Assessment: Blood pressure control: controlled Progress toward BP goal: at goal  Plan: Medications:  continue current medications - losartan, HCTZ, metoprolol, imdur

## 2013-05-24 NOTE — Assessment & Plan Note (Signed)
Today she reports she feels more swollen and edematous.  Amlodipine d/c at last visit and BP well controlled today.  Given her co-morbidities, will check echo to eval for heart failure.

## 2013-05-25 NOTE — Progress Notes (Signed)
Case discussed with Dr. Sharda soon after the resident saw the patient.  We reviewed the resident's history and exam and pertinent patient test results.  I agree with the assessment, diagnosis, and plan of care documented in the resident's note. 

## 2013-06-07 ENCOUNTER — Telehealth: Payer: Self-pay | Admitting: *Deleted

## 2013-06-07 ENCOUNTER — Emergency Department (HOSPITAL_COMMUNITY)
Admission: EM | Admit: 2013-06-07 | Discharge: 2013-06-08 | Disposition: A | Discharge status: Home / Self Care | Payer: PRIVATE HEALTH INSURANCE | Attending: Emergency Medicine | Admitting: Emergency Medicine

## 2013-06-07 ENCOUNTER — Emergency Department (INDEPENDENT_AMBULATORY_CARE_PROVIDER_SITE_OTHER)
Admission: EM | Admit: 2013-06-07 | Discharge: 2013-06-07 | Disposition: A | Discharge status: Nursing Home (Includes ICF) | Payer: PRIVATE HEALTH INSURANCE | Source: Home / Self Care

## 2013-06-07 ENCOUNTER — Encounter (HOSPITAL_COMMUNITY): Payer: Self-pay | Admitting: Emergency Medicine

## 2013-06-07 DIAGNOSIS — Z8639 Personal history of other endocrine, nutritional and metabolic disease: Secondary | ICD-10-CM | POA: Insufficient documentation

## 2013-06-07 DIAGNOSIS — Z8739 Personal history of other diseases of the musculoskeletal system and connective tissue: Secondary | ICD-10-CM | POA: Insufficient documentation

## 2013-06-07 DIAGNOSIS — R141 Gas pain: Secondary | ICD-10-CM | POA: Insufficient documentation

## 2013-06-07 DIAGNOSIS — Z8669 Personal history of other diseases of the nervous system and sense organs: Secondary | ICD-10-CM | POA: Insufficient documentation

## 2013-06-07 DIAGNOSIS — F329 Major depressive disorder, single episode, unspecified: Secondary | ICD-10-CM | POA: Insufficient documentation

## 2013-06-07 DIAGNOSIS — R143 Flatulence: Secondary | ICD-10-CM | POA: Insufficient documentation

## 2013-06-07 DIAGNOSIS — E119 Type 2 diabetes mellitus without complications: Secondary | ICD-10-CM | POA: Insufficient documentation

## 2013-06-07 DIAGNOSIS — I2510 Atherosclerotic heart disease of native coronary artery without angina pectoris: Secondary | ICD-10-CM | POA: Insufficient documentation

## 2013-06-07 DIAGNOSIS — R142 Eructation: Secondary | ICD-10-CM | POA: Insufficient documentation

## 2013-06-07 DIAGNOSIS — I10 Essential (primary) hypertension: Secondary | ICD-10-CM | POA: Insufficient documentation

## 2013-06-07 DIAGNOSIS — Z794 Long term (current) use of insulin: Secondary | ICD-10-CM | POA: Insufficient documentation

## 2013-06-07 DIAGNOSIS — Z880 Allergy status to penicillin: Secondary | ICD-10-CM | POA: Insufficient documentation

## 2013-06-07 DIAGNOSIS — Z8719 Personal history of other diseases of the digestive system: Secondary | ICD-10-CM | POA: Insufficient documentation

## 2013-06-07 DIAGNOSIS — K219 Gastro-esophageal reflux disease without esophagitis: Secondary | ICD-10-CM | POA: Insufficient documentation

## 2013-06-07 DIAGNOSIS — D649 Anemia, unspecified: Secondary | ICD-10-CM | POA: Insufficient documentation

## 2013-06-07 DIAGNOSIS — Z862 Personal history of diseases of the blood and blood-forming organs and certain disorders involving the immune mechanism: Secondary | ICD-10-CM | POA: Insufficient documentation

## 2013-06-07 DIAGNOSIS — R110 Nausea: Secondary | ICD-10-CM | POA: Insufficient documentation

## 2013-06-07 DIAGNOSIS — R1013 Epigastric pain: Secondary | ICD-10-CM

## 2013-06-07 DIAGNOSIS — Z7982 Long term (current) use of aspirin: Secondary | ICD-10-CM | POA: Insufficient documentation

## 2013-06-07 DIAGNOSIS — Z8659 Personal history of other mental and behavioral disorders: Secondary | ICD-10-CM | POA: Insufficient documentation

## 2013-06-07 DIAGNOSIS — R109 Unspecified abdominal pain: Secondary | ICD-10-CM | POA: Insufficient documentation

## 2013-06-07 DIAGNOSIS — F3289 Other specified depressive episodes: Secondary | ICD-10-CM | POA: Insufficient documentation

## 2013-06-07 DIAGNOSIS — Z87891 Personal history of nicotine dependence: Secondary | ICD-10-CM | POA: Insufficient documentation

## 2013-06-07 DIAGNOSIS — M129 Arthropathy, unspecified: Secondary | ICD-10-CM | POA: Insufficient documentation

## 2013-06-07 DIAGNOSIS — Z79899 Other long term (current) drug therapy: Secondary | ICD-10-CM | POA: Insufficient documentation

## 2013-06-07 LAB — URINALYSIS, ROUTINE W REFLEX MICROSCOPIC
Bilirubin Urine: NEGATIVE
Glucose, UA: NEGATIVE mg/dL
Hgb urine dipstick: NEGATIVE
Specific Gravity, Urine: 1.014 (ref 1.005–1.030)
pH: 5.5 (ref 5.0–8.0)

## 2013-06-07 LAB — BASIC METABOLIC PANEL
BUN: 21 mg/dL (ref 6–23)
CO2: 26 mEq/L (ref 19–32)
Chloride: 102 mEq/L (ref 96–112)
Glucose, Bld: 70 mg/dL (ref 70–99)
Potassium: 4.5 mEq/L (ref 3.5–5.1)

## 2013-06-07 LAB — POCT I-STAT TROPONIN I: Troponin i, poc: 0.01 ng/mL (ref 0.00–0.08)

## 2013-06-07 LAB — CBC WITH DIFFERENTIAL/PLATELET
Eosinophils Absolute: 0.7 10*3/uL (ref 0.0–0.7)
Hemoglobin: 12.5 g/dL (ref 12.0–15.0)
Lymphocytes Relative: 52 % — ABNORMAL HIGH (ref 12–46)
Lymphs Abs: 4.3 10*3/uL — ABNORMAL HIGH (ref 0.7–4.0)
MCH: 25.8 pg — ABNORMAL LOW (ref 26.0–34.0)
Monocytes Relative: 6 % (ref 3–12)
Neutro Abs: 2.8 10*3/uL (ref 1.7–7.7)
Neutrophils Relative %: 34 % — ABNORMAL LOW (ref 43–77)
RBC: 4.85 MIL/uL (ref 3.87–5.11)
WBC: 8.3 10*3/uL (ref 4.0–10.5)

## 2013-06-07 MED ORDER — GI COCKTAIL ~~LOC~~
ORAL | Status: AC
  Filled 2013-06-07: qty 30

## 2013-06-07 MED ORDER — IOHEXOL 300 MG/ML  SOLN
25.0000 mL | INTRAMUSCULAR | Status: AC
  Administered 2013-06-07: 25 mL via ORAL

## 2013-06-07 MED ORDER — MORPHINE SULFATE 4 MG/ML IJ SOLN
6.0000 mg | Freq: Once | INTRAMUSCULAR | Status: AC
  Administered 2013-06-07: 6 mg via INTRAVENOUS
  Filled 2013-06-07 (×2): qty 1

## 2013-06-07 MED ORDER — ONDANSETRON HCL 4 MG/2ML IJ SOLN
4.0000 mg | Freq: Once | INTRAMUSCULAR | Status: AC
  Administered 2013-06-07: 4 mg via INTRAVENOUS
  Filled 2013-06-07: qty 2

## 2013-06-07 MED ORDER — GI COCKTAIL ~~LOC~~
30.0000 mL | Freq: Once | ORAL | Status: AC
  Administered 2013-06-07: 30 mL via ORAL

## 2013-06-07 MED ORDER — SODIUM CHLORIDE 0.9 % IV BOLUS (SEPSIS)
1000.0000 mL | Freq: Once | INTRAVENOUS | Status: AC
  Administered 2013-06-07: 1000 mL via INTRAVENOUS

## 2013-06-07 NOTE — ED Notes (Signed)
Pt c/o epigastric pain onset May Reports she has talked and seen her PCP about this Denies: f/v/n/d, constipation Alert w/no signs of acute distress.

## 2013-06-07 NOTE — ED Provider Notes (Signed)
[name] is a 62 y.o. female who presents to Urgent Care today for epigastric abdominal pain. Patient has had chronic abdominal pain present for over one month. She's had evaluations by her gastroenterologist including endoscopy and colonoscopy. These have not been revealing. Additionally she's had a CT scan in the emergency room in June which was not helpful. She notes today that she was awoken at about 3:00 in the morning with abdominal pain. The pain is moderate to severe and does not radiate much. The pain is located in her upper left quadrant.  It is worse with exertion and better with rest. She denies any pain with eating. She felt nauseated but has not vomited. She had normal bowel movement this morning without any blood in it.  Her pain is worse than previously.   PMH reviewed. History of small bowel resection for ileus or intussusception.  Diabetes History  Substance Use Topics  . Smoking status: Former Smoker    Types: Cigarettes    Quit date: 10/25/2002  . Smokeless tobacco: Not on file  . Alcohol Use: No   ROS as above Medications reviewed. No current facility-administered medications for this encounter.   Current Outpatient Prescriptions  Medication Sig Dispense Refill  . aspirin 81 MG tablet Take 81 mg by mouth daily.        Marland Kitchen buPROPion (WELLBUTRIN XL) 300 MG 24 hr tablet Take 1 tablet (300 mg total) by mouth daily.  30 tablet  5  . dexlansoprazole (DEXILANT) 60 MG capsule Take 60 mg by mouth daily.        Marland Kitchen dicyclomine (BENTYL) 10 MG capsule Take 1-2 capsules by mouth every 8 (eight) hours as needed.      Marland Kitchen FLUoxetine (PROZAC) 20 MG capsule Take 1 capsule (20 mg total) by mouth every morning.  30 capsule  5  . gabapentin (NEURONTIN) 800 MG tablet Take 800 mg by mouth 2 (two) times daily. 800 mg in the am and 1600 mg in the evening.      . insulin aspart (NOVOLOG) 100 UNIT/ML injection Inject 10 Units into the skin 3 (three) times daily before meals.  10 mL  3  . insulin  glargine (LANTUS SOLOSTAR) 100 UNIT/ML injection Inject 50 Units into the skin at bedtime.  3 pen  12  . isosorbide mononitrate (IMDUR) 60 MG 24 hr tablet Take 1 tablet (60 mg total) by mouth daily.  30 tablet  6  . losartan-hydrochlorothiazide (HYZAAR) 100-25 MG per tablet Take 1 tablet by mouth daily.  30 tablet  5  . Menthol, Topical Analgesic, (MINERAL ICE) 2 % GEL Apply 1 application topically as needed (pain).      . metoprolol succinate (TOPROL-XL) 25 MG 24 hr tablet Take 1 tablet (25 mg total) by mouth daily.  30 tablet  6  . ACCU-CHEK FASTCLIX LANCETS MISC 1 each by Does not apply route 3 (three) times daily before meals. Dx code 250.00  102 each  12  . diclofenac sodium (VOLTAREN) 1 % GEL Apply 2 g topically 3 (three) times daily.  1 Tube  0  . Docusate Sodium (COLACE PO) Take 2 tablets by mouth daily as needed (constipation).      . ferrous sulfate 325 (65 FE) MG tablet Take 1 tablet (325 mg total) by mouth 3 (three) times daily with meals.  30 tablet  3  . glucose blood (ACCU-CHEK SMARTVIEW) test strip Use to check blood sugar before meals dx code 250.00  100 each  12  .  Insulin Pen Needle 31G X 5 MM MISC Box of 100 - uses up to 5 times per day according to CBG  100 each  11  . senna (SENOKOT) 8.6 MG TABS Take 4 tablets by mouth daily as needed (constipation).        Exam:  BP 119/74  Pulse 70  Temp(Src) 98.2 F (36.8 C) (Oral)  Resp 16  SpO2 96% Gen: Well NAD HEENT: EOMI,  MMM Lungs: CTABL Nl WOB Heart: RRR no MRG Abd: NABS, mildly distended. Tender palpation left upper quadrant. Positive rebound and guarding. Exts: Non edematous BL  LE, warm and well perfused.   No results found for this or any previous visit (from the past 24 hour(s)). No results found.  Twelve-lead EKG shows normal sinus rhythm at 65 beats per minute.  Assessment and Plan: 62 y.o. female with epigastric abdominal pain. This is unclear etiology.  This is possibly gastroparesis or gastritis.   Additionally may be cardiac related, although her EKG is normal she does have long standing Diabetes which is a risk factor for coronary artery disease. Discussed options with the patient. As her pain is significantly worse than usual feel that evaluation the emergency room is warranted. Plan to transfer the the shuttle for further evaluation and management.       Rodolph Bong, MD 06/07/13 [phone]

## 2013-06-07 NOTE — ED Provider Notes (Signed)
CSN: [id number]     Arrival date & time 06/07/13  1730 History     First MD Initiated Contact with Patient 06/07/13 2145     Chief Complaint  Patient presents with  . Abdominal Pain   The history is provided by the patient.   patient reports a history of abdominal pain that is not as an etiology determined by primary care or gastroenterology.  She reports worsening abdominal pain over the past 24 hours.  She's had nausea without vomiting.  She denies diarrhea.  She reports abdominal distention.  She does have a history of small bowel obstruction with her last bowel obstruction in the spring of 2014.  At that time she required lysis of adhesions and partial bowel resection in March of 2014.  She reports her abdomen is become more swollen and this concerns her.  She also has a history in the chart of diabetic gastroparesis.  Past Medical History  Diagnosis Date  . Hypertension   . Peripheral neuropathy   . Cervicalgia   . Low back pain syndrome   . Hyperlipidemia   . Vitamin D deficiency   . Vitamin B12 deficiency   . Iliotibial band syndrome   . Anemia   . Borderline personality disorder   . Nonorganic psychosis   . GERD (gastroesophageal reflux disease)   . Diabetes mellitus   . Depression   . Diabetic gastroparesis   . Sleep apnea   . CAD (coronary artery disease)     Catherization 11/18/09>nonobstructive CAD , normal LV function, EF 65%  . Health maintenance examination     Colonoscopy 2009> normal; Diabetic eye exam 12/2008. No diabetic retinopathy; Mammogram 11/10> No evidence of malignancy, DXA 03/14/13 : normal  . Headache(784.0)   . Arthritis   . SBO (small bowel obstruction) 01/09/2013   Past Surgical History  Procedure Laterality Date  . Abdominal hysterectomy    . Hand surgery    . Bowel resection N/A 01/10/2013    Procedure: SMALL BOWEL RESECTION;  Surgeon: Lodema Pilot, DO;  Location: [facility] OR;  Service: General;  Laterality: N/A;  . Lysis of adhesion N/A 01/10/2013     Procedure: LYSIS OF ADHESION;  Surgeon: Lodema Pilot, DO;  Location: [facility] OR;  Service: General;  Laterality: N/A;  . Laparotomy N/A 01/10/2013    Procedure: EXPLORATORY LAPAROTOMY;  Surgeon: Lodema Pilot, DO;  Location: [facility] OR;  Service: General;  Laterality: N/A;   Family History  Problem Relation Age of Onset  . Diabetes Mother   . Hypertension Mother   . Hyperlipidemia Mother   . Arthritis Mother   . Depression Mother   . Heart disease Father   . Diabetes Sister   . Diabetes Brother   . Heart disease Brother   . Heart disease Paternal Grandmother   . Seizures Brother    History  Substance Use Topics  . Smoking status: Former Smoker    Types: Cigarettes    Quit date: 10/25/2002  . Smokeless tobacco: Not on file  . Alcohol Use: No   OB History   Grav Para Term Preterm Abortions TAB SAB Ect Mult Living                 Review of Systems  Gastrointestinal: Positive for abdominal pain.  All other systems reviewed and are negative.    Allergies  Indomethacin; Penicillins; Cimetidine; and Sulfonamide derivatives  Home Medications   Current Outpatient Rx  Name  Route  Sig  Dispense  Refill  .  aspirin 81 MG tablet   Oral   Take 81 mg by mouth daily.           Marland Kitchen buPROPion (WELLBUTRIN XL) 300 MG 24 hr tablet   Oral   Take 1 tablet (300 mg total) by mouth daily.   30 tablet   5   . dexlansoprazole (DEXILANT) 60 MG capsule   Oral   Take 60 mg by mouth daily.           . diclofenac sodium (VOLTAREN) 1 % GEL   Topical   Apply 2 g topically 3 (three) times daily.   1 Tube   0   . dicyclomine (BENTYL) 10 MG capsule   Oral   Take 1-2 capsules by mouth every 8 (eight) hours as needed.         Tery Sanfilippo Sodium (COLACE PO)   Oral   Take 2 tablets by mouth daily as needed (constipation).         . ferrous sulfate 325 (65 FE) MG tablet   Oral   Take 1 tablet (325 mg total) by mouth 3 (three) times daily with meals.   30 tablet   3   . FLUoxetine  (PROZAC) 20 MG capsule   Oral   Take 1 capsule (20 mg total) by mouth every morning.   30 capsule   5   . gabapentin (NEURONTIN) 800 MG tablet   Oral   Take 800 mg by mouth 2 (two) times daily. 800 mg in the am and 1600 mg in the evening.         Marland Kitchen glucose blood (ACCU-CHEK SMARTVIEW) test strip      Use to check blood sugar before meals dx code 250.00   100 each   12   . insulin aspart (NOVOLOG) 100 UNIT/ML injection   Subcutaneous   Inject 10 Units into the skin 3 (three) times daily before meals.   10 mL   3   . insulin glargine (LANTUS SOLOSTAR) 100 UNIT/ML injection   Subcutaneous   Inject 50 Units into the skin at bedtime.   3 pen   12   . Insulin Pen Needle 31G X 5 MM MISC      Box of 100 - uses up to 5 times per day according to CBG   100 each   11   . isosorbide mononitrate (IMDUR) 60 MG 24 hr tablet   Oral   Take 1 tablet (60 mg total) by mouth daily.   30 tablet   6   . losartan-hydrochlorothiazide (HYZAAR) 100-25 MG per tablet   Oral   Take 1 tablet by mouth daily.   30 tablet   5   . Menthol, Topical Analgesic, (MINERAL ICE) 2 % GEL   Apply externally   Apply 1 application topically as needed (pain).         . metoprolol succinate (TOPROL-XL) 25 MG 24 hr tablet   Oral   Take 1 tablet (25 mg total) by mouth daily.   30 tablet   6   . senna (SENOKOT) 8.6 MG TABS   Oral   Take 4 tablets by mouth daily as needed (constipation).         Lorenda Peck FASTCLIX LANCETS MISC   Does not apply   1 each by Does not apply route 3 (three) times daily before meals. Dx code 250.00   102 each   12    BP 129/98  Pulse 69  Temp(Src) 98.6 F (37 C) (Oral)  Resp 18  SpO2 100% Physical Exam  Nursing note and vitals reviewed. Constitutional: She is oriented to person, place, and time. She appears well-developed and well-nourished. No distress.  HENT:  Head: Normocephalic and atraumatic.  Eyes: EOM are normal.  Neck: Normal range of motion.   Cardiovascular: Normal rate, regular rhythm and normal heart sounds.   Pulmonary/Chest: Effort normal and breath sounds normal.  Abdominal: Soft. She exhibits distension.  Mild upper abdominal tenderness without guarding or rebound  Musculoskeletal: Normal range of motion.  Neurological: She is alert and oriented to person, place, and time.  Skin: Skin is warm and dry.  Psychiatric: She has a normal mood and affect. Judgment normal.    ED Course   Procedures (including critical care time)  Labs Reviewed  CBC WITH DIFFERENTIAL - Abnormal; Notable for the following:    MCV 77.9 (*)    MCH 25.8 (*)    RDW 16.3 (*)    Neutrophils Relative % 34 (*)    Lymphocytes Relative 52 (*)    Lymphs Abs 4.3 (*)    Eosinophils Relative 8 (*)    All other components within normal limits  BASIC METABOLIC PANEL - Abnormal; Notable for the following:    Creatinine, Ser 1.52 (*)    GFR calc non Af Amer 36 (*)    GFR calc Af Amer 41 (*)    All other components within normal limits  URINALYSIS, ROUTINE W REFLEX MICROSCOPIC  POCT I-STAT TROPONIN I   No results found. No diagnosis found.  MDM  Given the patient's history of bowel obstruction CT scan will be performed to evaluate for bowel obstruction.  Symptomatic treatment.  Care to Dr Manus Gunning  Lyanne Co, MD 06/08/13 [phone]

## 2013-06-07 NOTE — ED Notes (Signed)
To ED for eval of abd pain. Pt has been seen for this by multiple doctors and specialists since May without dx. Pt states she is to see a specialist at [facility] or [facility] in the near future. Pain in abd is generalized and 'goes around both sides to my back'. ECG done at [facility] today. Nausea but no vomiting. Pt states she is having bm's but has to take a laxative. Pt had endoscopy done in June without dx. Pt appears in pain, holding abd. Skin w/d, resp e/u.

## 2013-06-08 ENCOUNTER — Emergency Department (HOSPITAL_COMMUNITY): Payer: PRIVATE HEALTH INSURANCE

## 2013-06-08 ENCOUNTER — Encounter (HOSPITAL_COMMUNITY): Payer: Self-pay | Admitting: Radiology

## 2013-06-08 LAB — CG4 I-STAT (LACTIC ACID): Lactic Acid, Venous: 0.73 mmol/L (ref 0.5–2.2)

## 2013-06-08 MED ORDER — IOHEXOL 300 MG/ML  SOLN
80.0000 mL | Freq: Once | INTRAMUSCULAR | Status: AC | PRN
  Administered 2013-06-08: 80 mL via INTRAVENOUS

## 2013-06-08 NOTE — ED Provider Notes (Signed)
Assumed care from Dr. Patria Mane pending CT scan.  CT shows no acute pathology. Labs unremarkable. Abdomen is distended but soft and without peritoneal signs.  She is stable for followup with her specialists.  BP 164/76  Pulse 62  Temp(Src) 98.6 F (37 C) (Oral)  Resp 18  SpO2 97%   Glynn Octave, MD 06/08/13 [phone]

## 2013-06-09 ENCOUNTER — Telehealth: Payer: Self-pay | Admitting: Internal Medicine

## 2013-06-09 NOTE — Telephone Encounter (Signed)
INTERNAL MEDICINE RESIDENCY PROGRAM After-Hours Telephone Call    Reason for call:   I received a call from [name] on 06/09/2013 at 8:41 PM. Patient states that she has had chronic abdomen pain for over a month.  Hr pain is located at epigastric area, moderate to severe without radiation.  Associated symptoms including nausea.  Denies vomiting, diarrhea or constipation.  Her pain is worse with exertion and better with rest She was evaluated for abdominal pain on Thursday 06/07/13.  Abdomen CT showed as follows. IMPRESSION:  1. No acute findings in the abdomen or pelvis to account for the  patient's symptoms.  2. Mild laxity of the ventral abdominal wall without frank  herniation.  3. Atherosclerosis, including left circumflex coronary artery  disease. Assessment for potential risk factor modification,  dietary therapy or pharmacologic therapy may be warranted, if  clinically indicated.  4. Status post hysterectomy.  5. Mild left renal atrophy with mild multifocal cortical thinning  in the kidneys bilaterally.   She was discharged from the ED with instructions to followup with her PCP at our clinic. She reports she continues to have abdominal pain as before and would like to have some pain medication called into her pharmacy      Assessment / Plan / Recommendations:   I discussed with [name] that I am unable to assess her abdomen pain over the telephone, and instructed her to go to emergency room now for abdominal pain evaluation.  I will route this note to her PCP and the clinic to set up for a followup appointment on Monday.  Patient agrees with above plan and states that she will go to ED tonight.  As always, pt is advised that if symptoms worsen or new symptoms arise, they should go to an urgent care facility or to to ER for further evaluation.    Dede Query, MD   06/09/2013, 8:41 PM

## 2013-06-12 ENCOUNTER — Other Ambulatory Visit (HOSPITAL_COMMUNITY): Payer: Self-pay | Admitting: Gastroenterology

## 2013-06-12 DIAGNOSIS — R1013 Epigastric pain: Secondary | ICD-10-CM

## 2013-06-15 NOTE — Telephone Encounter (Signed)
review 

## 2013-06-18 ENCOUNTER — Encounter: Payer: Self-pay | Admitting: Internal Medicine

## 2013-06-18 ENCOUNTER — Ambulatory Visit (HOSPITAL_COMMUNITY)
Admission: RE | Admit: 2013-06-18 | Discharge: 2013-06-18 | Disposition: A | Discharge status: Home / Self Care | Payer: PRIVATE HEALTH INSURANCE | Source: Ambulatory Visit | Attending: Internal Medicine | Admitting: Internal Medicine

## 2013-06-18 ENCOUNTER — Ambulatory Visit (INDEPENDENT_AMBULATORY_CARE_PROVIDER_SITE_OTHER): Payer: PRIVATE HEALTH INSURANCE | Admitting: Internal Medicine

## 2013-06-18 VITALS — BP 166/73 | HR 67 | Temp 97.4°F | Wt 205.8 lb

## 2013-06-18 DIAGNOSIS — R079 Chest pain, unspecified: Secondary | ICD-10-CM

## 2013-06-18 DIAGNOSIS — M797 Fibromyalgia: Secondary | ICD-10-CM | POA: Insufficient documentation

## 2013-06-18 DIAGNOSIS — G8929 Other chronic pain: Secondary | ICD-10-CM

## 2013-06-18 DIAGNOSIS — I10 Essential (primary) hypertension: Secondary | ICD-10-CM

## 2013-06-18 DIAGNOSIS — IMO0001 Reserved for inherently not codable concepts without codable children: Secondary | ICD-10-CM

## 2013-06-18 MED ORDER — AMITRIPTYLINE HCL 25 MG PO TABS: 25.0000 mg | ORAL_TABLET | Freq: Every day | ORAL | Status: DC

## 2013-06-18 NOTE — Progress Notes (Signed)
Patient ID: [name], female   DOB: 05-19-1951, 62 y.o.   MRN: [id number]   Subjective:   HPI: [name] is a 62 y.o. with past medical history of chronic chest pain, hypertension, hyperlipidemia, nonobstructive coronary artery disease, presents to the clinic with complaints of chest pain for 2 days.  Patient reports chest pain started on 2 days prior to presentation to clinic when she woke up feeling pressure in her chest, associated with pain that radiated into her left upper extremity. She describes her chest pain as being dull, reaching an intensity of 9/10 and reduces to 8/10. During the clinic visit, the patient reported still to be experiencing chest pain of 8/10. She reports both exertional and positional components to the chest pain. She also reported symptoms of feeling nauseous and 'flushed'. She denies symptoms of vomiting. She reports that she was feeling woozy, without history of falls or palpitations. Patient did not have symptoms of shortness of breath, cough, wheezing, orthopnea, PND, or bilateral, lower extremity swelling. Patient also reports history of epigastric pain.  She denies fevers, chills, but reports increased fatigue, and insomnia, mainly due to pain.  Patient has had negative cardiac evaluation, including a normal EST in 2012 and a cardiac cath in 2011, which showed small vessel lesions. No stent.   Patient's history is significant for cigarette smoking for 30 years, but she week. No alcohol ingestion.   Patient's family history is remarkable for her dad who died of a massive MI in his 36s, paternal grandmother, who died of an MI and also had stroke. She has a brother who had a triple bypass surgery in his 9s.    Past Medical History  Diagnosis Date  . Hypertension   . Peripheral neuropathy   . Cervicalgia   . Low back pain syndrome   . Hyperlipidemia   . Vitamin D deficiency   . Vitamin B12 deficiency   . Iliotibial band syndrome   . Anemia   .  Borderline personality disorder   . Nonorganic psychosis   . GERD (gastroesophageal reflux disease)   . Diabetes mellitus   . Depression   . Diabetic gastroparesis   . Sleep apnea   . CAD (coronary artery disease)     Catherization 11/18/09>nonobstructive CAD , normal LV function, EF 65%  . Health maintenance examination     Colonoscopy 2009> normal; Diabetic eye exam 12/2008. No diabetic retinopathy; Mammogram 11/10> No evidence of malignancy, DXA 03/14/13 : normal  . Headache(784.0)   . Arthritis   . SBO (small bowel obstruction) 01/09/2013   Current Outpatient Prescriptions  Medication Sig Dispense Refill  . ACCU-CHEK FASTCLIX LANCETS MISC 1 each by Does not apply route 3 (three) times daily before meals. Dx code 250.00  102 each  12  . aspirin 81 MG tablet Take 81 mg by mouth daily.        Marland Kitchen buPROPion (WELLBUTRIN XL) 300 MG 24 hr tablet Take 1 tablet (300 mg total) by mouth daily.  30 tablet  5  . dexlansoprazole (DEXILANT) 60 MG capsule Take 60 mg by mouth daily.        . diclofenac sodium (VOLTAREN) 1 % GEL Apply 2 g topically 3 (three) times daily.  1 Tube  0  . dicyclomine (BENTYL) 10 MG capsule Take 1-2 capsules by mouth every 8 (eight) hours as needed.      Tery Sanfilippo Sodium (COLACE PO) Take 2 tablets by mouth daily as needed (constipation).      Marland Kitchen  ferrous sulfate 325 (65 FE) MG tablet Take 1 tablet (325 mg total) by mouth 3 (three) times daily with meals.  30 tablet  3  . FLUoxetine (PROZAC) 20 MG capsule Take 1 capsule (20 mg total) by mouth every morning.  30 capsule  5  . gabapentin (NEURONTIN) 800 MG tablet Take 800 mg by mouth 2 (two) times daily. 800 mg in the am and 1600 mg in the evening.      Marland Kitchen glucose blood (ACCU-CHEK SMARTVIEW) test strip Use to check blood sugar before meals dx code 250.00  100 each  12  . insulin aspart (NOVOLOG) 100 UNIT/ML injection Inject 10 Units into the skin 3 (three) times daily before meals.  10 mL  3  . insulin glargine (LANTUS SOLOSTAR) 100  UNIT/ML injection Inject 50 Units into the skin at bedtime.  3 pen  12  . Insulin Pen Needle 31G X 5 MM MISC Box of 100 - uses up to 5 times per day according to CBG  100 each  11  . isosorbide mononitrate (IMDUR) 60 MG 24 hr tablet Take 1 tablet (60 mg total) by mouth daily.  30 tablet  6  . losartan-hydrochlorothiazide (HYZAAR) 100-25 MG per tablet Take 1 tablet by mouth daily.  30 tablet  5  . Menthol, Topical Analgesic, (MINERAL ICE) 2 % GEL Apply 1 application topically as needed (pain).      . metoprolol succinate (TOPROL-XL) 25 MG 24 hr tablet Take 1 tablet (25 mg total) by mouth daily.  30 tablet  6  . senna (SENOKOT) 8.6 MG TABS Take 4 tablets by mouth daily as needed (constipation).      Marland Kitchen amitriptyline (ELAVIL) 25 MG tablet Take 1 tablet (25 mg total) by mouth at bedtime.  30 tablet  1   No current facility-administered medications for this visit.   Family History  Problem Relation Age of Onset  . Diabetes Mother   . Hypertension Mother   . Hyperlipidemia Mother   . Arthritis Mother   . Depression Mother   . Heart disease Father   . Diabetes Sister   . Diabetes Brother   . Heart disease Brother   . Heart disease Paternal Grandmother   . Seizures Brother    History   Social History  . Marital Status: Married    Spouse Name: N/A    Number of Children: N/A  . Years of Education: N/A   Social History Main Topics  . Smoking status: Former Smoker    Types: Cigarettes    Quit date: 10/25/2002  . Smokeless tobacco: None  . Alcohol Use: No  . Drug Use: No  . Sexual Activity: Yes   Other Topics Concern  . None   Social History Narrative  . None   Review of Systems: Constitutional: Denies fever, chills, diaphoresis, appetite change.  Gastrointestinal: No abdominal pain, nausea, vomiting, bloody stools Genitourinary: No dysuria, frequency, hematuria, or flank pain.  Musculoskeletal: No myalgias, back pain, joint swelling, arthralgias    Objective:  Physical  Exam: Filed Vitals:   06/18/13 1129  BP: 166/73  Pulse: 67  Temp: 97.4 F (36.3 C)  TempSrc: Oral  Weight: 205 lb 12.8 oz (93.35 kg)  SpO2: 97%   General: Appears depressed, well nourished. No acute distress.  Lungs: CTA bilaterally. Left sided chest wall tenderness. No lesions seen. Heart: RRR; no extra sounds or murmurs  Abdomen: Non-distended, normal BS, soft, epigastric tenderness; no hepatosplenomegaly  Extremities: No pedal edema. At  least 11/18 tender points noted on physical exam. Neurologic: Alert and oriented x3. No obvious neurologic deficits.  Assessment & Plan:  I have discussed my assessment and plan  with Dr. Josem Kaufmann as detailed under problem based charting.

## 2013-06-18 NOTE — Assessment & Plan Note (Signed)
Clinical history and physical examination findings are consistent with fibromyalgia. Patient has at least 11 tender points. She reported that previously she had been on Amitriptyline 10 mg qhs without relief. Patient is already on multiple medications including Wellbutrin, Prozac, and the Gabapentin (almost maxed out).  Plan. - Start amitriptyline at 25 mg each bedtime after discussion with Dr. Josem Kaufmann who personally evaluated the patient - Patient has been counseled about that the treatment is  unlikely to completely resolve her pain but she might get some relief with amitriptyline at bedtime. She reports that sometime she wakes up in pain.  Patient verbalized understanding. - She will follow up with PCP, Dr Everardo Beals in 07/2013

## 2013-06-18 NOTE — Assessment & Plan Note (Signed)
BP is elevated likely secondary to her reported diffuse pain. Previous numbers have been well controlled. No changes to her treatment today.

## 2013-06-18 NOTE — Patient Instructions (Addendum)
Please take Amitriptyline 25 mg at bedtime and we see it that will help your pain. If you develop severe dizziness, too much sleepiness please call clinic since this might be related to this medications I have attached information on Amitriptyline for you Please follow up with Dr Everardo Beals in 07/2013  Please continue taking the rest of your medications as prescribed.  Amitriptyline tablets What is this medicine? AMITRIPTYLINE (a mee TRIP ti leen) is used to treat depression. This medicine may be used for other purposes; ask your health care provider or pharmacist if you have questions. What should I tell my health care provider before I take this medicine? They need to know if you have any of these conditions: -an alcohol problem -asthma, difficulty breathing -bipolar disorder or schizophrenia -difficulty passing urine, prostate trouble -glaucoma -heart disease or previous heart attack -liver disease -over active thyroid -seizures -thoughts or plans of suicide, a previous suicide attempt, or family history of suicide attempt -an unusual or allergic reaction to amitriptyline, other medicines, foods, dyes, or preservatives -pregnant or trying to get pregnant -breast-feeding How should I use this medicine? Take this medicine by mouth with a drink of water. Follow the directions on the prescription label. You can take the tablets with or without food. Take your medicine at regular intervals. Do not take it more often than directed. If you have been taking this medicine regularly for some time, do not suddenly stop taking it. You must gradually reduce the dose or you may get severe side effects. Ask your doctor or health care professional for advice. Even after you stop taking this medicine it can still affect your body for several days. A special MedGuide will be given to you by the pharmacist with each prescription and refill. Be sure to read this information carefully each time. Talk to your  pediatrician regarding the use of this medicine in children. Special care may be needed. Overdosage: If you think you have taken too much of this medicine contact a poison control center or emergency room at once. NOTE: This medicine is only for you. Do not share this medicine with others. What if I miss a dose? If you miss a dose, take it as soon as you can. If it is almost time for your next dose, take only that dose. Do not take double or extra doses. What may interact with this medicine? Do not take this medicine with any of the following medications: -arsenic trioxide -certain medicines used to regulate abnormal heartbeat or to treat other heart conditions -cisapride -droperidol -halofantrine -linezolid -MAOIs like Carbex, Eldepryl, Marplan, Nardil, and Parnate -methylene blue -other medicines for mental depression -phenothiazines like perphenazine, thioridazine and chlorpromazine -pimozide -probucol -procarbazine -sparfloxacin -St. John's Wort -ziprasidone This medicine may also interact with the following medications: -atropine and related drugs like hyoscyamine, scopolamine, tolterodine and others -barbiturate medicines for inducing sleep or treating seizures, like phenobarbital -cimetidine -disulfiram -ethchlorvynol -thyroid hormones such as levothyroxine This list may not describe all possible interactions. Give your health care provider a list of all the medicines, herbs, non-prescription drugs, or dietary supplements you use. Also tell them if you smoke, drink alcohol, or use illegal drugs. Some items may interact with your medicine. What should I watch for while using this medicine? Visit your doctor or health care professional for regular checks on your progress. It can take several days before you feel the full effect of this medicine. Patients and their families should watch out for worsening depression or thoughts  of suicide. Also watch out for sudden or severe  changes in feelings such as feeling anxious, agitated, panicky, irritable, hostile, aggressive, impulsive, severely restless, overly excited and hyperactive, or not being able to sleep. If this happens, especially at the beginning of antidepressant treatment or after a change in dose, call your health care professional. Bonita Quin may get drowsy or dizzy. Do not drive, use machinery, or do anything that needs mental alertness until you know how this medicine affects you. Do not stand or sit up quickly, especially if you are an older patient. This reduces the risk of dizzy or fainting spells. Alcohol may increase dizziness and drowsiness. Avoid alcoholic drinks. Do not treat yourself for coughs, colds, or allergies without asking your doctor or health care professional for advice. Some ingredients can increase possible side effects. Your mouth may get dry. Chewing sugarless gum or sucking hard candy, and drinking plenty of water will help. This medicine may cause dry eyes and blurred vision. If you wear contact lenses you may feel some discomfort. Lubricating drops may help. See your eye doctor if the problem does not go away or is severe. This medicine can make you more sensitive to the sun. Keep out of the sun. If you cannot avoid being in the sun, wear protective clothing and use sunscreen. Do not use sun lamps or tanning beds/booths. If you are diabetic, check your blood sugar more often than usual, especially during the first few weeks of treatment with this medicine. This medicine can affect blood sugar levels. Call your doctor or health care professional for advice if you notice a change in the results of blood or urine glucose tests. What side effects may I notice from receiving this medicine? Side effects that you should report to your doctor or health care professional as soon as possible: -allergic reactions like skin rash, itching or hives, swelling of the face, lips, or tongue -abnormal production of  milk in females -breast enlargement in both males and females -breathing problems -confusion, hallucinations -fast, irregular heartbeat -fever with increased sweating -muscle stiffness, or spasms -pain or difficulty passing urine, loss of bladder control -seizures -suicidal thoughts or other mood changes -swelling of the testicles -tingling, pain, or numbness in the feet or hands -yellowing of the eyes or skin Side effects that usually do not require medical attention (report to your doctor or health care professional if they continue or are bothersome): -change in sex drive or performance -constipation or diarrhea -nausea, vomiting -weight gain or loss This list may not describe all possible side effects. Call your doctor for medical advice about side effects. You may report side effects to FDA at 1-800-FDA-1088. Where should I keep my medicine? Keep out of the reach of children. Store at room temperature between 20 and 25 degrees C (68 and 77 degrees F). Throw away any unused medicine after the expiration date. NOTE: This sheet is a summary. It may not cover all possible information. If you have questions about this medicine, talk to your doctor, pharmacist, or health care provider.  2012, [employer]vier/Gold Standard. (05/21/2010 3:38:28 PM)

## 2013-06-18 NOTE — Assessment & Plan Note (Addendum)
Patient's presentation with chest pain raised a concern of a cardiac etiology. However, her exam reveals reproducible left chest wall tenderness. She also has epigastric pain and tenderness. Vital signs were noted for elevated blood pressure but otherwise normal. EKG was performed in the office - normal. Previous cardiac evaluation has been negative. Likely etiology is musculoskeletal versus GI related pain.  Plan. -continue with Dexilant - no indication for further cardiac evaluation at this time. - will treat her for Fibromyalgia and her pain may respond - will start Amitriptyline 25 mg at bedtime  -Patient encouraged to call EMS if her chest pain worsens or if she develops shortness of breath, diaphoresis, dizziness, or falls.

## 2013-06-19 ENCOUNTER — Encounter (HOSPITAL_COMMUNITY)
Admission: RE | Admit: 2013-06-19 | Discharge: 2013-06-19 | Disposition: A | Discharge status: Home / Self Care | Payer: PRIVATE HEALTH INSURANCE | Source: Ambulatory Visit | Attending: Gastroenterology | Admitting: Gastroenterology

## 2013-06-19 DIAGNOSIS — R1013 Epigastric pain: Secondary | ICD-10-CM | POA: Insufficient documentation

## 2013-06-19 MED ORDER — TECHNETIUM TC 99M SULFUR COLLOID: 2.0000 | Freq: Once | INTRAVENOUS | Status: AC | PRN

## 2013-06-19 MED ORDER — TECHNETIUM TC 99M SULFUR COLLOID
2.0000 | Freq: Once | INTRAVENOUS | Status: AC | PRN
  Administered 2013-06-19: 2 via INTRAVENOUS

## 2013-06-19 NOTE — Progress Notes (Signed)
I saw and evaluated the patient.  I personally confirmed the key portions of Dr. Huntley Dec history and exam and reviewed pertinent patient test results.  The assessment, diagnosis, and plan were formulated together and I agree with the documentation in the resident's note.  I suspect that her complaints are consistent with fibromyalgia.  Unfortunately, she is maxed out on gabapentin and still having significant pain.  We will try amitriptyline in hopes of improving her neuropathic pain and thus improving her sleep.

## 2013-06-20 ENCOUNTER — Ambulatory Visit (INDEPENDENT_AMBULATORY_CARE_PROVIDER_SITE_OTHER): Payer: 59 | Admitting: Psychiatry

## 2013-06-20 DIAGNOSIS — F339 Major depressive disorder, recurrent, unspecified: Secondary | ICD-10-CM | POA: Insufficient documentation

## 2013-06-20 DIAGNOSIS — F332 Major depressive disorder, recurrent severe without psychotic features: Secondary | ICD-10-CM

## 2013-06-20 DIAGNOSIS — F329 Major depressive disorder, single episode, unspecified: Secondary | ICD-10-CM

## 2013-06-20 MED ORDER — FLUOXETINE HCL 20 MG PO CAPS: 20.0000 mg | ORAL_CAPSULE | Freq: Every morning | ORAL | Status: DC

## 2013-06-20 MED ORDER — BUPROPION HCL ER (XL) 300 MG PO TB24: 300.0000 mg | ORAL_TABLET | Freq: Every day | ORAL | Status: DC

## 2013-06-20 NOTE — Progress Notes (Signed)
[facility] MD Progress Note  06/20/2013 1:40 PM [name]  MRN:  [id number] Subjective: exhausted Patient is seen today alone and she is on time. Generally she feels not much different. In the medical evaluation she had after she saw me she was diagnosed with anemia. She's recently had a gastric swallowing study to measure motility it was normal. She is scheduled to get a second opinion from [facility] in regards to her GI tract. The stone is fairly certain that she is no cardiac problems. It is evident that her anemia likely is causing a low energy and even some mild dysphoria. The patient however admits that she didn't have any problems with her GI tract i.e. Pain and no chest pain she feels fine. Is not describe persistent daily depression. She is sleeping fairly well. She is a good appetite. She denies being suicidal. She denies any specific psychosocial stressors at this time. Financially she is good her husband and her are getting along well and her children are well. The patient is still enjoying some of the things in her life is very bothered by her abdominal and chest pain. Presently the patient is taking iron. I shared with her that the iron will take months to actually have a significant benefit in terms of her increasing her energy. At this time it is reasonable to continue her medications and not make any changes. Today we reviewed the pros and cons of her medication and the patient agreed to continue taking them as prescribed. Shared with her probably take 2-3 months before her anemia is improved and she sees an improvement in her energy. It is unfortunate because she feels so energy less and in pain she no longer gets opportunity to spend time with her grandson. It is my hope that she gets physically better she'll feel much better emotionally. Diagnosis:   DSM5: Schizophrenia Disorders:   Obsessive-Compulsive Disorders:   Trauma-Stressor Disorders:   Substance/Addictive Disorders:   Depressive  Disorders:  Major Depressive Disorder - Mild (296.21)  Axis I: Major Depression, Recurrent severe  ADL's:  Intact  Sleep: Good  Appetite:  Good  Suicidal Ideation:  no Homicidal Ideation:  no AEB (as evidenced by):  Psychiatric Specialty Exam: ROS  There were no vitals taken for this visit.There is no weight on file to calculate BMI.  General Appearance: Fairly Groomed  Patent attorney:: good  Speech:  Clear and Coherent  Volume:  Normal  Mood  low  Affect:  Appropriate  Thought Process:  Coherent  Orientation:  Full (Time, Place, and Person)  Thought Content:  WDL  Suicidal Thoughts:  No  Homicidal Thoughts:  No  Memory:  nl  Judgement:  Good  Insight:  Good  Psychomotor Activity:  Normal  Concentration:  Good  Recall:  Good  Akathisia:  No  Handed:  Right  AIMS (if indicated):     Assets:  Desire for Improvement  Sleep:      Current Medications: Current Outpatient Prescriptions  Medication Sig Dispense Refill  . ACCU-CHEK FASTCLIX LANCETS MISC 1 each by Does not apply route 3 (three) times daily before meals. Dx code 250.00  102 each  12  . amitriptyline (ELAVIL) 25 MG tablet Take 1 tablet (25 mg total) by mouth at bedtime.  30 tablet  1  . aspirin 81 MG tablet Take 81 mg by mouth daily.        Marland Kitchen buPROPion (WELLBUTRIN XL) 300 MG 24 hr tablet Take 1 tablet (300 mg total) by  mouth daily.  30 tablet  5  . dexlansoprazole (DEXILANT) 60 MG capsule Take 60 mg by mouth daily.        . diclofenac sodium (VOLTAREN) 1 % GEL Apply 2 g topically 3 (three) times daily.  1 Tube  0  . dicyclomine (BENTYL) 10 MG capsule Take 1-2 capsules by mouth every 8 (eight) hours as needed.      Tery Sanfilippo Sodium (COLACE PO) Take 2 tablets by mouth daily as needed (constipation).      . ferrous sulfate 325 (65 FE) MG tablet Take 1 tablet (325 mg total) by mouth 3 (three) times daily with meals.  30 tablet  3  . FLUoxetine (PROZAC) 20 MG capsule Take 1 capsule (20 mg total) by mouth every  morning.  30 capsule  5  . gabapentin (NEURONTIN) 800 MG tablet Take 800 mg by mouth 2 (two) times daily. 800 mg in the am and 1600 mg in the evening.      Marland Kitchen glucose blood (ACCU-CHEK SMARTVIEW) test strip Use to check blood sugar before meals dx code 250.00  100 each  12  . insulin aspart (NOVOLOG) 100 UNIT/ML injection Inject 10 Units into the skin 3 (three) times daily before meals.  10 mL  3  . insulin glargine (LANTUS SOLOSTAR) 100 UNIT/ML injection Inject 50 Units into the skin at bedtime.  3 pen  12  . Insulin Pen Needle 31G X 5 MM MISC Box of 100 - uses up to 5 times per day according to CBG  100 each  11  . isosorbide mononitrate (IMDUR) 60 MG 24 hr tablet Take 1 tablet (60 mg total) by mouth daily.  30 tablet  6  . losartan-hydrochlorothiazide (HYZAAR) 100-25 MG per tablet Take 1 tablet by mouth daily.  30 tablet  5  . Menthol, Topical Analgesic, (MINERAL ICE) 2 % GEL Apply 1 application topically as needed (pain).      . metoprolol succinate (TOPROL-XL) 25 MG 24 hr tablet Take 1 tablet (25 mg total) by mouth daily.  30 tablet  6  . senna (SENOKOT) 8.6 MG TABS Take 4 tablets by mouth daily as needed (constipation).       No current facility-administered medications for this visit.    Lab Results: No results found for this or any previous visit (from the past 48 hour(s)).  Physical Findings: AIMS:  , ,  ,  ,    CIWA:    COWS:     Treatment Plan Summary: At this time the patient will continue taking well which are in and Prozac as prescribed. While between doses 300 mg in her Prozac dose is 20 mg. This patient will continue taking her Iron and will return to see me in 3 months. At this time the patient is functioning fairly well and is not suicidal.  Plan:  Medical Decision Making Problem Points:  Established problem, worsening (2) Data Points:  Review of medication regiment & side effects (2)  I certify that inpatient services furnished can reasonably be expected to improve the  patient's condition.   Horice Carrero IRVING 06/20/2013, 1:40 PM

## 2013-06-21 ENCOUNTER — Ambulatory Visit (HOSPITAL_COMMUNITY): Payer: Self-pay | Admitting: Licensed Clinical Social Worker

## 2013-07-02 ENCOUNTER — Ambulatory Visit (INDEPENDENT_AMBULATORY_CARE_PROVIDER_SITE_OTHER): Payer: 59 | Admitting: Licensed Clinical Social Worker

## 2013-07-02 DIAGNOSIS — F339 Major depressive disorder, recurrent, unspecified: Secondary | ICD-10-CM

## 2013-07-02 NOTE — Progress Notes (Signed)
   THERAPIST PROGRESS NOTE  Session Time: 4:00pm-4:50pm  Participation Level: Active  Behavioral Response: Well GroomedLethargicAnxious, Depressed and Irritable  Type of Therapy: Individual Therapy  Treatment Goals addressed: Coping  Interventions: CBT, Motivational Interviewing, Strength-based, Supportive and Reframing  Summary: [name] is a 62 y.o. female who presents with depressed mood and agitated affect. She reports ongoing frustration with her physical state and continues to have stomach problems. She reports that she has gained a lot of weight and is eating more. She tries to stop herself, but finds that she eats for comfort and to manage her feelings. She is able to acknowledge that food has replaced alcohol for her. She remains angry with her husband and has different expectations of him than he has of himself. Her sleep is wnl.    Suicidal/Homicidal: Nowithout intent/plan  Therapist Response: Assessed patients current functioning and reviewed progress. Reviewed coping strategies. Assessed patients safety and assisted in identifying protective factors.  Reviewed crisis plan with patient. Assisted patient with the expression of frustration. Reviewed patients self care plan. Assessed progress related to self care. Patients self care is fair. Recommend daily exercise, increased socialization and recreation. Used CBT to assist patient with the identification of negative distortions and irrational thoughts. Encouraged patient to verbalize alternative and factual responses which challenge thought distortions. Reviewed healthy boundaries and assertive communication.   Plan: Return again in 3 weeks.  Diagnosis: Axis I: Major Depression, Recurrent severe    Axis II: No diagnosis    Noboru Bidinger, LCSW 07/02/2013

## 2013-07-06 ENCOUNTER — Ambulatory Visit (HOSPITAL_COMMUNITY)
Admission: RE | Admit: 2013-07-06 | Discharge: 2013-07-06 | Disposition: A | Discharge status: Home / Self Care | Payer: PRIVATE HEALTH INSURANCE | Source: Ambulatory Visit | Attending: Internal Medicine | Admitting: Internal Medicine

## 2013-07-06 DIAGNOSIS — G8929 Other chronic pain: Secondary | ICD-10-CM

## 2013-07-06 DIAGNOSIS — R609 Edema, unspecified: Secondary | ICD-10-CM | POA: Insufficient documentation

## 2013-07-06 DIAGNOSIS — E119 Type 2 diabetes mellitus without complications: Secondary | ICD-10-CM | POA: Insufficient documentation

## 2013-07-06 DIAGNOSIS — I10 Essential (primary) hypertension: Secondary | ICD-10-CM | POA: Insufficient documentation

## 2013-07-06 DIAGNOSIS — R079 Chest pain, unspecified: Secondary | ICD-10-CM | POA: Insufficient documentation

## 2013-07-06 DIAGNOSIS — E785 Hyperlipidemia, unspecified: Secondary | ICD-10-CM | POA: Insufficient documentation

## 2013-07-06 DIAGNOSIS — I359 Nonrheumatic aortic valve disorder, unspecified: Secondary | ICD-10-CM

## 2013-07-06 DIAGNOSIS — Z8249 Family history of ischemic heart disease and other diseases of the circulatory system: Secondary | ICD-10-CM | POA: Insufficient documentation

## 2013-07-06 DIAGNOSIS — E1165 Type 2 diabetes mellitus with hyperglycemia: Secondary | ICD-10-CM

## 2013-07-06 NOTE — Progress Notes (Signed)
  Echocardiogram 2D Echocardiogram has been performed.  [name] 07/06/2013, 12:10 PM

## 2013-07-11 ENCOUNTER — Encounter: Payer: Self-pay | Admitting: Internal Medicine

## 2013-07-11 DIAGNOSIS — I5030 Unspecified diastolic (congestive) heart failure: Secondary | ICD-10-CM | POA: Insufficient documentation

## 2013-07-12 ENCOUNTER — Encounter: Payer: Self-pay | Admitting: Internal Medicine

## 2013-07-12 ENCOUNTER — Ambulatory Visit (INDEPENDENT_AMBULATORY_CARE_PROVIDER_SITE_OTHER): Payer: PRIVATE HEALTH INSURANCE | Admitting: Internal Medicine

## 2013-07-12 VITALS — BP 148/77 | HR 93 | Temp 97.5°F | Ht 65.0 in | Wt 207.7 lb

## 2013-07-12 DIAGNOSIS — I509 Heart failure, unspecified: Secondary | ICD-10-CM

## 2013-07-12 DIAGNOSIS — I10 Essential (primary) hypertension: Secondary | ICD-10-CM

## 2013-07-12 DIAGNOSIS — R1013 Epigastric pain: Secondary | ICD-10-CM

## 2013-07-12 DIAGNOSIS — E1165 Type 2 diabetes mellitus with hyperglycemia: Secondary | ICD-10-CM

## 2013-07-12 DIAGNOSIS — M1990 Unspecified osteoarthritis, unspecified site: Secondary | ICD-10-CM

## 2013-07-12 DIAGNOSIS — I5030 Unspecified diastolic (congestive) heart failure: Secondary | ICD-10-CM

## 2013-07-12 MED ORDER — LOSARTAN POTASSIUM 100 MG PO TABS
100.0000 mg | ORAL_TABLET | Freq: Every day | ORAL | Status: DC
Start: 2013-07-12 — End: 2013-09-14

## 2013-07-12 MED ORDER — FUROSEMIDE 40 MG PO TABS: 40.0000 mg | ORAL_TABLET | Freq: Every day | ORAL | Status: DC

## 2013-07-12 MED ORDER — INSULIN ASPART 100 UNIT/ML FLEXPEN: 10.0000 [IU] | PEN_INJECTOR | Freq: Three times a day (TID) | SUBCUTANEOUS | Status: DC

## 2013-07-12 MED ORDER — ACCU-CHEK FASTCLIX LANCETS MISC: 1.0000 | Freq: Three times a day (TID) | Status: DC

## 2013-07-12 MED ORDER — METOPROLOL SUCCINATE ER 25 MG PO TB24: 25.0000 mg | ORAL_TABLET | Freq: Every day | ORAL | Status: DC

## 2013-07-12 MED ORDER — INSULIN GLARGINE 100 UNIT/ML ~~LOC~~ SOLN: 50.0000 [IU] | Freq: Every day | SUBCUTANEOUS | Status: DC

## 2013-07-12 MED ORDER — GLUCOSE BLOOD VI STRP: ORAL_STRIP | Status: DC

## 2013-07-12 NOTE — Assessment & Plan Note (Signed)
Patient follows with Dr. Matthias Hughs, who has referred her to [facility].  She does have dCHF, so will start trial of lasix to see if abdominal bloating is from HF.

## 2013-07-12 NOTE — Assessment & Plan Note (Signed)
Difficult situation for pain mgmt - avoiding narcotics due to abdominal pain and h/o SBO, renal fxn won't allow for NSAIDs, tramadol hasn't helped, nor have tylenol & voltaren gel. Has tried lidoderm patch in the past without relief.  Will refer to pain clinic for injection.

## 2013-07-12 NOTE — Assessment & Plan Note (Signed)
Patient's GFR is 41, but given dCHF, she may benefit from lasix.  We will therefore d/c HCTZ today and start lasix 40 mg daily and monitor for improvement. She will return in 1 week to monitor electrolytes.

## 2013-07-12 NOTE — Patient Instructions (Addendum)
General Instructions:  -We are going to refer you to pain management to see if there is a role for a back injection -We are going to discontinue Losartan-hydrochlorothiazide combo and start losartan 100mg  separately with Lasix 40mg  daily -Return in 1 week for blood work  Please be sure to bring all of your medications with you to every visit.  Should you have any new or worsening symptoms, please be sure to call the clinic at [phone].   Treatment Goals:  Goals (1 Years of Data) as of 07/12/13         As of Today 06/18/13 06/08/13 06/08/13 06/07/13     Blood Pressure    . Blood Pressure < 150/90  148/77 166/73 143/65 164/76 129/98     Result Component    . HEMOGLOBIN A1C < 7.0          . LDL CALC < 100            Progress Toward Treatment Goals:  Treatment Goal 07/12/2013  Hemoglobin A1C -  Blood pressure at goal    Self Care Goals & Plans:  Self Care Goal 07/12/2013  Manage my medications take my medicines as prescribed  Monitor my health -  Eat healthy foods -  Be physically active -  Meeting treatment goals -    Home Blood Glucose Monitoring 07/12/2013  Check my blood sugar 3 times a day  When to check my blood sugar before breakfast; at bedtime; before lunch     Care Management & Community Referrals:  Referral 05/23/2013  Referrals made for care management support none needed

## 2013-07-12 NOTE — Progress Notes (Signed)
Subjective:   Patient ID: [name] female   DOB: 1951/10/07 62 y.o.   MRN: [id number]  HPI: [name] is a 62 y.o. woman with DM, GERD & HTN who presents for routine follow up.  Chronic abdominal pain, for which narcotics have been discontinued so subsequently has significant back pain (for which she has taken tylenol extra strength up to TID).  Chronic nausea without vomiting. No diarrhea or constipation - takes sennakot regularly. Described as just "hurting", has been since SBO. In pain all the time. 8/10 today (and at best), 10/10 at worse. She has an appt at [facility] next month (digestive health). Therapist thinks that she is overcompensating by over eating  (because of feelings towards husband and no smoking/drinking).    Review of Systems: Constitutional: Denies fever, chills, diaphoresis and fatigue.  Respiratory: Denies SOB, DOE, cough, chest tightness, and wheezing.  Cardiovascular: Denies chest pain, palpitations Genitourinary: Denies dysuria, urgency, frequency, hematuria, flank pain and difficulty urinating.  Neurological: Denies dizziness, seizures, syncope, lightheadedness s.   Past Medical History  Diagnosis Date  . Hypertension   . Peripheral neuropathy   . Cervicalgia   . Low back pain syndrome   . Hyperlipidemia   . Vitamin D deficiency   . Vitamin B12 deficiency   . Iliotibial band syndrome   . Anemia   . Borderline personality disorder   . Nonorganic psychosis   . GERD (gastroesophageal reflux disease)   . Diabetes mellitus   . Depression   . Diabetic gastroparesis   . Sleep apnea   . CAD (coronary artery disease)     Catherization 11/18/09>nonobstructive CAD , normal LV function, EF 65%  . Health maintenance examination     Colonoscopy 2009> normal; Diabetic eye exam 12/2008. No diabetic retinopathy; Mammogram 11/10> No evidence of malignancy, DXA 03/14/13 : normal  . Headache(784.0)   . Arthritis   . SBO (small bowel obstruction) 01/09/2013  .  Diastolic CHF     Grade I, on Echo 06/2013, EF 55-60%   Current Outpatient Prescriptions  Medication Sig Dispense Refill  . ACCU-CHEK FASTCLIX LANCETS MISC 1 each by Does not apply route 3 (three) times daily before meals. Dx code 250.00  102 each  12  . amitriptyline (ELAVIL) 25 MG tablet Take 1 tablet (25 mg total) by mouth at bedtime.  30 tablet  1  . aspirin 81 MG tablet Take 81 mg by mouth daily.        Marland Kitchen buPROPion (WELLBUTRIN XL) 300 MG 24 hr tablet Take 1 tablet (300 mg total) by mouth daily.  30 tablet  5  . dexlansoprazole (DEXILANT) 60 MG capsule Take 60 mg by mouth daily.        . diclofenac sodium (VOLTAREN) 1 % GEL Apply 2 g topically 3 (three) times daily.  1 Tube  0  . dicyclomine (BENTYL) 10 MG capsule Take 1-2 capsules by mouth every 8 (eight) hours as needed.      Tery Sanfilippo Sodium (COLACE PO) Take 2 tablets by mouth daily as needed (constipation).      . ferrous sulfate 325 (65 FE) MG tablet Take 1 tablet (325 mg total) by mouth 3 (three) times daily with meals.  30 tablet  3  . FLUoxetine (PROZAC) 20 MG capsule Take 1 capsule (20 mg total) by mouth every morning.  30 capsule  5  . gabapentin (NEURONTIN) 800 MG tablet Take 800 mg by mouth 2 (two) times daily. 800 mg in the am and  1600 mg in the evening.      Marland Kitchen glucose blood (ACCU-CHEK SMARTVIEW) test strip Use to check blood sugar before meals dx code 250.00  100 each  12  . insulin aspart (NOVOLOG) 100 UNIT/ML injection Inject 10 Units into the skin 3 (three) times daily before meals.  10 mL  3  . insulin glargine (LANTUS SOLOSTAR) 100 UNIT/ML injection Inject 50 Units into the skin at bedtime.  3 pen  12  . Insulin Pen Needle 31G X 5 MM MISC Box of 100 - uses up to 5 times per day according to CBG  100 each  11  . isosorbide mononitrate (IMDUR) 60 MG 24 hr tablet Take 1 tablet (60 mg total) by mouth daily.  30 tablet  6  . losartan-hydrochlorothiazide (HYZAAR) 100-25 MG per tablet Take 1 tablet by mouth daily.  30 tablet  5    . Menthol, Topical Analgesic, (MINERAL ICE) 2 % GEL Apply 1 application topically as needed (pain).      . metoprolol succinate (TOPROL-XL) 25 MG 24 hr tablet Take 1 tablet (25 mg total) by mouth daily.  30 tablet  6  . senna (SENOKOT) 8.6 MG TABS Take 4 tablets by mouth daily as needed (constipation).       No current facility-administered medications for this visit.   Family History  Problem Relation Age of Onset  . Diabetes Mother   . Hypertension Mother   . Hyperlipidemia Mother   . Arthritis Mother   . Depression Mother   . Heart disease Father   . Diabetes Sister   . Diabetes Brother   . Heart disease Brother   . Heart disease Paternal Grandmother   . Seizures Brother    History   Social History  . Marital Status: Married    Spouse Name: N/A    Number of Children: N/A  . Years of Education: N/A   Social History Main Topics  . Smoking status: Former Smoker    Types: Cigarettes    Quit date: 10/25/2002  . Smokeless tobacco: None  . Alcohol Use: No  . Drug Use: No  . Sexual Activity: Yes   Other Topics Concern  . None   Social History Narrative  . None    Objective:  Physical Exam: Filed Vitals:   07/12/13 1420  BP: 148/77  Pulse: 93  Temp: 97.5 F (36.4 C)  TempSrc: Oral  Height: 5\' 5"  (1.651 m)  Weight: 207 lb 11.2 oz (94.212 kg)  SpO2: 97%    Cardiac: RRR, no rubs, murmurs or gallops Pulm: clear to auscultation bilaterally, moving normal volumes of air Abd: soft, nontender, bloated, BS normoactive Ext: warm and well perfused, trace pitting edema b/l LE Neuro: alert and oriented X3, cranial nerves II-XII grossly intact  Assessment & Plan:   Case and care discussed with Dr. Dalphine Handing.  Please see problem oriented charting for further details. Patient to return in 2 months for routine follow up of DM, back pain and HTN/dCHF.

## 2013-07-13 NOTE — Progress Notes (Signed)
Case discussed with Dr. Sharda at the time of the visit.  We reviewed the resident's history and exam and pertinent patient test results.  I agree with the assessment, diagnosis, and plan of care documented in the resident's note.    

## 2013-07-16 ENCOUNTER — Ambulatory Visit (INDEPENDENT_AMBULATORY_CARE_PROVIDER_SITE_OTHER): Payer: 59 | Admitting: Licensed Clinical Social Worker

## 2013-07-16 DIAGNOSIS — F329 Major depressive disorder, single episode, unspecified: Secondary | ICD-10-CM

## 2013-07-16 NOTE — Progress Notes (Signed)
   THERAPIST PROGRESS NOTE  Session Time: 4:00pm-4:50pm  Participation Level: Active  Behavioral Response: Well GroomedAlertDepressed and Irritable  Type of Therapy: Individual Therapy  Treatment Goals addressed: Coping  Interventions: CBT, Motivational Interviewing, Strength-based, Supportive and Reframing  Summary: [name] is a 62 y.o. female who presents with depressed mood and irritable affect. She reports ongoing frustration with her marriage and her chronic pain. She remains frustrated that doctors are unable to discern what is wrong with her stomach. She processes her anger with her husband, her expectations and needs which are not being met and her desire to have happiness in her life.    Suicidal/Homicidal: Nowithout intent/plan  Therapist Response: Assessed patients current functioning and reviewed progress. Reviewed coping strategies. Assessed patients safety and assisted in identifying protective factors.  Reviewed crisis plan with patient. Assisted patient with the expression of anger. Reviewed patients self care plan. Assessed progress related to self care. Patients self care is fair. Recommend daily exercise, increased socialization and recreation. Used CBT to assist patient with the identification of negative distortions and irrational thoughts. Encouraged patient to verbalize alternative and factual responses which challenge thought distortions. Reviewed healthy boundaries and assertive communication. Used motivational interviewing to assist and encourage patient through the change process. Explored patients barriers to change.   Plan: Return again in three weeks.  Diagnosis: Axis I: Depressive Disorder NOS    Axis II: No diagnosis    Bernon Arviso, LCSW 07/16/2013

## 2013-07-19 ENCOUNTER — Other Ambulatory Visit (INDEPENDENT_AMBULATORY_CARE_PROVIDER_SITE_OTHER): Payer: PRIVATE HEALTH INSURANCE

## 2013-07-19 DIAGNOSIS — I10 Essential (primary) hypertension: Secondary | ICD-10-CM

## 2013-07-19 DIAGNOSIS — E1165 Type 2 diabetes mellitus with hyperglycemia: Secondary | ICD-10-CM

## 2013-07-19 LAB — LIPID PANEL
Cholesterol: 252 mg/dL — ABNORMAL HIGH (ref 0–200)
LDL Cholesterol: 138 mg/dL — ABNORMAL HIGH (ref 0–99)
Total CHOL/HDL Ratio: 5.7 Ratio
Triglycerides: 352 mg/dL — ABNORMAL HIGH (ref <150)
VLDL: 70 mg/dL — ABNORMAL HIGH (ref 0–40)

## 2013-07-19 LAB — BASIC METABOLIC PANEL
Calcium: 10.2 mg/dL (ref 8.4–10.5)
Potassium: 4.5 mEq/L (ref 3.5–5.3)
Sodium: 135 mEq/L (ref 135–145)

## 2013-07-24 ENCOUNTER — Encounter (HOSPITAL_COMMUNITY): Payer: Self-pay | Admitting: Licensed Clinical Social Worker

## 2013-07-24 ENCOUNTER — Ambulatory Visit (HOSPITAL_COMMUNITY): Payer: Self-pay | Admitting: Licensed Clinical Social Worker

## 2013-07-24 NOTE — Progress Notes (Signed)
Patient ID: [name], female   DOB: [birth year], 62 y.o.   MRN: [id number] Patient was a no show for today's appointment.

## 2013-07-25 ENCOUNTER — Encounter: Payer: Self-pay | Admitting: Physical Medicine & Rehabilitation

## 2013-08-01 ENCOUNTER — Other Ambulatory Visit (HOSPITAL_COMMUNITY)
Admission: RE | Admit: 2013-08-01 | Discharge: 2013-08-01 | Disposition: A | Discharge status: Home / Self Care | Payer: PRIVATE HEALTH INSURANCE | Source: Ambulatory Visit | Attending: Internal Medicine | Admitting: Internal Medicine

## 2013-08-01 ENCOUNTER — Telehealth: Payer: Self-pay | Admitting: *Deleted

## 2013-08-01 ENCOUNTER — Ambulatory Visit (INDEPENDENT_AMBULATORY_CARE_PROVIDER_SITE_OTHER): Payer: PRIVATE HEALTH INSURANCE | Admitting: Internal Medicine

## 2013-08-01 ENCOUNTER — Encounter: Payer: Self-pay | Admitting: Internal Medicine

## 2013-08-01 VITALS — BP 100/60 | HR 81 | Temp 97.7°F | Ht 65.0 in | Wt 208.0 lb

## 2013-08-01 DIAGNOSIS — N390 Urinary tract infection, site not specified: Secondary | ICD-10-CM

## 2013-08-01 DIAGNOSIS — Z113 Encounter for screening for infections with a predominantly sexual mode of transmission: Secondary | ICD-10-CM | POA: Insufficient documentation

## 2013-08-01 DIAGNOSIS — J069 Acute upper respiratory infection, unspecified: Secondary | ICD-10-CM

## 2013-08-01 DIAGNOSIS — R109 Unspecified abdominal pain: Secondary | ICD-10-CM | POA: Insufficient documentation

## 2013-08-01 LAB — CBC WITH DIFFERENTIAL/PLATELET
Basophils Absolute: 0 10*3/uL (ref 0.0–0.1)
Eosinophils Absolute: 0.5 10*3/uL (ref 0.0–0.7)
HCT: 35 % — ABNORMAL LOW (ref 36.0–46.0)
Hemoglobin: 11.5 g/dL — ABNORMAL LOW (ref 12.0–15.0)
Lymphocytes Relative: 52 % — ABNORMAL HIGH (ref 12–46)
Lymphs Abs: 5.1 10*3/uL — ABNORMAL HIGH (ref 0.7–4.0)
MCH: 24.3 pg — ABNORMAL LOW (ref 26.0–34.0)
MCHC: 32.9 g/dL (ref 30.0–36.0)
Monocytes Relative: 6 % (ref 3–12)
Neutro Abs: 3.6 10*3/uL (ref 1.7–7.7)
Neutrophils Relative %: 37 % — ABNORMAL LOW (ref 43–77)
Platelets: 297 10*3/uL (ref 150–400)
RBC: 4.74 MIL/uL (ref 3.87–5.11)
WBC: 9.7 10*3/uL (ref 4.0–10.5)

## 2013-08-01 NOTE — Telephone Encounter (Signed)
Pt called with c/o abd pain and low back pain.  This pain is not new but she also has rectal pain. Onset 2 weeks ago.  Sunday patient noted bright red blood from rectum. This is new for her. She does not feel  Eating, and needs tylenol to help her sleep. She did have BM today with no blood noted.  Will see at 2:30 today.

## 2013-08-01 NOTE — Patient Instructions (Signed)
We will check your urine and blood today for a possibility of urine tract infection. I will give you a call.  Please take over the counter tylenol for your pain for now You may take muccinex for your cough. Otherwise come back if symptoms persist or feel more sick.

## 2013-08-02 ENCOUNTER — Ambulatory Visit (HOSPITAL_COMMUNITY): Payer: Self-pay | Admitting: Licensed Clinical Social Worker

## 2013-08-02 ENCOUNTER — Encounter (HOSPITAL_COMMUNITY): Payer: Self-pay | Admitting: Licensed Clinical Social Worker

## 2013-08-02 LAB — URINALYSIS, ROUTINE W REFLEX MICROSCOPIC
Bilirubin Urine: NEGATIVE
Hgb urine dipstick: NEGATIVE
Ketones, ur: NEGATIVE mg/dL
Leukocytes, UA: NEGATIVE
Nitrite: NEGATIVE
Protein, ur: NEGATIVE mg/dL
Specific Gravity, Urine: 1.012 (ref 1.005–1.030)
Urobilinogen, UA: 0.2 mg/dL (ref 0.0–1.0)
pH: 5.5 (ref 5.0–8.0)

## 2013-08-02 NOTE — Assessment & Plan Note (Signed)
Likely acute viral bronchitis and improving since last week. Advised OTC mucinex if needed.

## 2013-08-02 NOTE — Progress Notes (Signed)
Patient ID: [name], female   DOB: March 28, 1951, 62 y.o.   MRN: [id number] Patient cancelled late due to illness.

## 2013-08-02 NOTE — Assessment & Plan Note (Signed)
Etiology of abd pain is likely UTI even though she does not have urinary symptoms or fevers. Pelvic and rectal exam is unremarkable. GI etiology like enteritis can not be excluded.   Plan  - urinalysis +/- culture - testing for STI - CBC - OTC tylenol and rehydration with plenty of oral fluids - continue with PPI (might help with epigastric pain) - will call with results and abx if indicated.

## 2013-08-02 NOTE — Progress Notes (Signed)
Patient ID: [name], female   DOB: 1951/06/03, 62 y.o.   MRN: [id number]   Subjective:   HPI: [name] is a 62 y.o. woman with past medical history of fibromyalgia, hypertension, depression, and peripheral neuropathy among a number of other medical problems, presents to the clinic for an acute visit with abdominal pain for 4 days.  She reports abdominal pain to be located in the lower part of her abd but also going to the epigastric area. The pain is mild but present all the time. She rates it a 6/10, dull, nonradiating with no relieving or aggravating factors. She denies diarrhea, or vomiting. She has nausea. She doesn't have urinary symptoms. Sexual contact last month with her long time husband. She denies vaginal discharge or irritation.  No fevers or chills, but she's been feeling more fatigued. She also reports pain in her "bottom" with an episode of bloody stool 3 days prior to presentation. Her last bowel movement was on the morning of presentation and was normal with diarrhea or blood. Her appetite has been normal. Of note she has a remote history of hysterectomy (for pelvic infection) and SBO.   She is recovering from what appears to be an upper respiratory tract infection with sneezing, nasal stuffiness and cough. She has history of contact with her grandson, who was evaluated in the ED with shortness of breath and cough last week.    Past Medical History  Diagnosis Date  . Hypertension   . Peripheral neuropathy   . Cervicalgia   . Low back pain syndrome   . Hyperlipidemia   . Vitamin D deficiency   . Vitamin B12 deficiency   . Iliotibial band syndrome   . Anemia   . Borderline personality disorder   . Nonorganic psychosis   . GERD (gastroesophageal reflux disease)   . Diabetes mellitus   . Depression   . Diabetic gastroparesis   . Sleep apnea   . CAD (coronary artery disease)     Catherization 11/18/09>nonobstructive CAD , normal LV function, EF 65%  . Health  maintenance examination     Colonoscopy 2009> normal; Diabetic eye exam 12/2008. No diabetic retinopathy; Mammogram 11/10> No evidence of malignancy, DXA 03/14/13 : normal  . Headache(784.0)   . Arthritis   . SBO (small bowel obstruction) 01/09/2013  . Diastolic CHF     Grade I, on Echo 06/2013, EF 55-60%   Current Outpatient Prescriptions  Medication Sig Dispense Refill  . ACCU-CHEK FASTCLIX LANCETS MISC 1 each by Does not apply route 3 (three) times daily before meals. Dx code 250.00  102 each  12  . amitriptyline (ELAVIL) 25 MG tablet Take 1 tablet (25 mg total) by mouth at bedtime.  30 tablet  1  . aspirin 81 MG tablet Take 81 mg by mouth daily.        Marland Kitchen buPROPion (WELLBUTRIN XL) 300 MG 24 hr tablet Take 1 tablet (300 mg total) by mouth daily.  30 tablet  5  . dexlansoprazole (DEXILANT) 60 MG capsule Take 60 mg by mouth daily.        . diclofenac sodium (VOLTAREN) 1 % GEL Apply 2 g topically 3 (three) times daily.  1 Tube  0  . Docusate Sodium (COLACE PO) Take 2 tablets by mouth daily as needed (constipation).      . ferrous sulfate 325 (65 FE) MG tablet Take 1 tablet (325 mg total) by mouth 3 (three) times daily with meals.  30 tablet  3  .  FLUoxetine (PROZAC) 20 MG capsule Take 1 capsule (20 mg total) by mouth every morning.  30 capsule  5  . furosemide (LASIX) 40 MG tablet Take 1 tablet (40 mg total) by mouth daily.  30 tablet  11  . gabapentin (NEURONTIN) 800 MG tablet Take 800 mg by mouth 2 (two) times daily. 800 mg in the am and 1600 mg in the evening.      Marland Kitchen glucose blood (ACCU-CHEK SMARTVIEW) test strip Use to check blood sugar before meals dx code 250.00  100 each  12  . insulin aspart (NOVOLOG FLEXPEN) 100 UNIT/ML SOPN FlexPen Inject 10 Units into the skin 3 (three) times daily with meals.  5 pen  12  . insulin glargine (LANTUS) 100 UNIT/ML injection Inject 0.5 mLs (50 Units total) into the skin at bedtime.  3 pen  12  . Insulin Pen Needle 31G X 5 MM MISC Box of 100 - uses up to 5  times per day according to CBG  100 each  11  . isosorbide mononitrate (IMDUR) 60 MG 24 hr tablet Take 1 tablet (60 mg total) by mouth daily.  30 tablet  6  . losartan (COZAAR) 100 MG tablet Take 1 tablet (100 mg total) by mouth daily.  30 tablet  11  . Menthol, Topical Analgesic, (MINERAL ICE) 2 % GEL Apply 1 application topically as needed (pain).      . metoprolol succinate (TOPROL-XL) 25 MG 24 hr tablet Take 1 tablet (25 mg total) by mouth daily.  30 tablet  11  . senna (SENOKOT) 8.6 MG TABS Take 4 tablets by mouth daily as needed (constipation).       No current facility-administered medications for this visit.   Family History  Problem Relation Age of Onset  . Diabetes Mother   . Hypertension Mother   . Hyperlipidemia Mother   . Arthritis Mother   . Depression Mother   . Heart disease Father   . Diabetes Sister   . Diabetes Brother   . Heart disease Brother   . Heart disease Paternal Grandmother   . Seizures Brother    History   Social History  . Marital Status: Married    Spouse Name: N/A    Number of Children: N/A  . Years of Education: N/A   Social History Main Topics  . Smoking status: Former Smoker    Types: Cigarettes    Quit date: 10/25/2002  . Smokeless tobacco: None  . Alcohol Use: No  . Drug Use: No  . Sexual Activity: Yes   Other Topics Concern  . None   Social History Narrative  . None   Review of Systems: Constitutional: Denies fever, chills, diaphoresis, appetite change and fatigue. However, reports a 'wavy feeling when she stands up'  Respiratory: Denies SOB, DOE, chest tightness, and wheezing. Denies chest pain. Her recent cough is improving. Cardiovascular: No chest pain, palpitations and leg swelling.  Musculoskeletal: chronic generalized myalgias, back pain, joint swelling, arthralgias 2/2 fibromyalgia. Psych: treatment for depression symptoms. No current SI or SA.   Objective:  Physical Exam: Filed Vitals:   08/01/13 1508  BP: 100/60    Pulse: 81  Temp: 97.7 F (36.5 C)  TempSrc: Oral  Height: 5\' 5"  (1.651 m)  Weight: 208 lb (94.348 kg)  SpO2: 97%   General: Obese, mild acute distress.  HEENT: Normal oral mucosa. MMM.  Lungs: CTA bilaterally. Heart: RRR; no extra sounds or murmurs  Abdomen: Non-distended, + epigastric and suprapubic tenderness,  no CVA tenderness. No guarding or rebound tenderness. normal BS. The abdomen is soft with no hepatosplenomegaly  Rectal exam is unremarkable. FOBT neg.  Pelvic exam is unremarkable for her scanty vaginal discharge without fishy smell.  Extremities: No pedal edema. No joint swelling or tenderness. Neurologic: Normal EOM,  Alert and oriented x3. No obvious neurologic/cranial nerve deficits.  Assessment & Plan:  I have discussed my assessment and plan  with  my attending in the clinic, Dr. Josem Kaufmann as detailed under problem based charting.

## 2013-08-03 ENCOUNTER — Other Ambulatory Visit: Payer: Self-pay

## 2013-08-03 ENCOUNTER — Encounter: Payer: Self-pay | Admitting: Internal Medicine

## 2013-08-03 ENCOUNTER — Ambulatory Visit (INDEPENDENT_AMBULATORY_CARE_PROVIDER_SITE_OTHER): Payer: PRIVATE HEALTH INSURANCE | Admitting: Internal Medicine

## 2013-08-03 VITALS — BP 113/69 | HR 81 | Temp 97.7°F | Ht 65.0 in | Wt 207.3 lb

## 2013-08-03 DIAGNOSIS — M533 Sacrococcygeal disorders, not elsewhere classified: Secondary | ICD-10-CM

## 2013-08-03 MED ORDER — TRAMADOL HCL 50 MG PO TABS: 50.0000 mg | ORAL_TABLET | Freq: Three times a day (TID) | ORAL | Status: DC | PRN

## 2013-08-03 NOTE — Progress Notes (Signed)
Patient ID: [name], female   DOB: 1951-01-25, 62 y.o.   MRN: [id number]   Subjective:   HPI: [name] is a 62 y.o. woman with past medical history of fibromyalgia, hypertension, depression, and peripheral neuropathy among a number of other medical problems, presents to the clinic for a followup visit for abdominal pain. She also reports pain in her bottom.  Last clinic visit 2 days ago (08/01/2013) for abdominal pain. She reports her abdominal pain is still located in the lower abd but also going to the epigastric area and those the pain have persisted. No interim symptoms. She has tried Tylenol as I recommended 2 days ago without relief. She reports that she has had previous relief with Vicodin. This medication was discontinued by her PCP due to concern of gastroparesis in the setting of history of SBO in March/2014 which required ileal resection as anastomosis.  She also continues to complain about pain in her tail bone. Same as 2 days ago during her visit with.   Careful pelvic and abdominal examination was only significant for suprapubic tenderness. Urinalysis was unremarkable. Rectal and pelvic exam 2 days ago was normal.   Past Medical History  Diagnosis Date  . Hypertension   . Peripheral neuropathy   . Cervicalgia   . Low back pain syndrome   . Hyperlipidemia   . Vitamin D deficiency   . Vitamin B12 deficiency   . Iliotibial band syndrome   . Anemia   . Borderline personality disorder   . Nonorganic psychosis   . GERD (gastroesophageal reflux disease)   . Diabetes mellitus   . Depression   . Diabetic gastroparesis   . Sleep apnea   . CAD (coronary artery disease)     Catherization 11/18/09>nonobstructive CAD , normal LV function, EF 65%  . Health maintenance examination     Colonoscopy 2009> normal; Diabetic eye exam 12/2008. No diabetic retinopathy; Mammogram 11/10> No evidence of malignancy, DXA 03/14/13 : normal  . Headache(784.0)   . Arthritis   . SBO (small  bowel obstruction) 01/09/2013  . Diastolic CHF     Grade I, on Echo 06/2013, EF 55-60%   Current Outpatient Prescriptions  Medication Sig Dispense Refill  . ACCU-CHEK FASTCLIX LANCETS MISC 1 each by Does not apply route 3 (three) times daily before meals. Dx code 250.00  102 each  12  . amitriptyline (ELAVIL) 25 MG tablet Take 1 tablet (25 mg total) by mouth at bedtime.  30 tablet  1  . aspirin 81 MG tablet Take 81 mg by mouth daily.        Marland Kitchen buPROPion (WELLBUTRIN XL) 300 MG 24 hr tablet Take 1 tablet (300 mg total) by mouth daily.  30 tablet  5  . dexlansoprazole (DEXILANT) 60 MG capsule Take 60 mg by mouth daily.        . diclofenac sodium (VOLTAREN) 1 % GEL Apply 2 g topically 3 (three) times daily.  1 Tube  0  . Docusate Sodium (COLACE PO) Take 2 tablets by mouth daily as needed (constipation).      . ferrous sulfate 325 (65 FE) MG tablet Take 1 tablet (325 mg total) by mouth 3 (three) times daily with meals.  30 tablet  3  . FLUoxetine (PROZAC) 20 MG capsule Take 1 capsule (20 mg total) by mouth every morning.  30 capsule  5  . furosemide (LASIX) 40 MG tablet Take 1 tablet (40 mg total) by mouth daily.  30 tablet  11  .  gabapentin (NEURONTIN) 800 MG tablet Take 800 mg by mouth 2 (two) times daily. 800 mg in the am and 1600 mg in the evening.      Marland Kitchen glucose blood (ACCU-CHEK SMARTVIEW) test strip Use to check blood sugar before meals dx code 250.00  100 each  12  . insulin aspart (NOVOLOG FLEXPEN) 100 UNIT/ML SOPN FlexPen Inject 10 Units into the skin 3 (three) times daily with meals.  5 pen  12  . insulin glargine (LANTUS) 100 UNIT/ML injection Inject 0.5 mLs (50 Units total) into the skin at bedtime.  3 pen  12  . Insulin Pen Needle 31G X 5 MM MISC Box of 100 - uses up to 5 times per day according to CBG  100 each  11  . isosorbide mononitrate (IMDUR) 60 MG 24 hr tablet Take 1 tablet (60 mg total) by mouth daily.  30 tablet  6  . losartan (COZAAR) 100 MG tablet Take 1 tablet (100 mg total)  by mouth daily.  30 tablet  11  . Menthol, Topical Analgesic, (MINERAL ICE) 2 % GEL Apply 1 application topically as needed (pain).      . metoprolol succinate (TOPROL-XL) 25 MG 24 hr tablet Take 1 tablet (25 mg total) by mouth daily.  30 tablet  11  . senna (SENOKOT) 8.6 MG TABS Take 4 tablets by mouth daily as needed (constipation).      . traMADol (ULTRAM) 50 MG tablet Take 1 tablet (50 mg total) by mouth every 8 (eight) hours as needed for pain.  90 tablet  0   No current facility-administered medications for this visit.   Family History  Problem Relation Age of Onset  . Diabetes Mother   . Hypertension Mother   . Hyperlipidemia Mother   . Arthritis Mother   . Depression Mother   . Heart disease Father   . Diabetes Sister   . Diabetes Brother   . Heart disease Brother   . Heart disease Paternal Grandmother   . Seizures Brother    History   Social History  . Marital Status: Married    Spouse Name: N/A    Number of Children: N/A  . Years of Education: N/A   Social History Main Topics  . Smoking status: Former Smoker    Types: Cigarettes    Quit date: 10/25/2002  . Smokeless tobacco: None  . Alcohol Use: No  . Drug Use: No  . Sexual Activity: Yes   Other Topics Concern  . None   Social History Narrative  . None   Review of Systems: Constitutional: Denies fever, chills, diaphoresis, appetite change and fatigue.  Respiratory: Denies SOB, DOE, chest tightness, and wheezing. Denies chest pain. Her recent cough is improving. Cardiovascular: No chest pain, palpitations and leg swelling.  Musculoskeletal: chronic generalized myalgias, back pain, joint swelling, arthralgias 2/2 fibromyalgia. Psych: treatment for depression symptoms. No current SI or SA.   Objective:  Physical Exam: Filed Vitals:   08/03/13 1507  BP: 113/69  Pulse: 81  Temp: 97.7 F (36.5 C)  TempSrc: Oral  Height: 5\' 5"  (1.651 m)  Weight: 207 lb 4.8 oz (94.031 kg)  SpO2: 100%   General:  Obese, mild acute distress.  HEENT: Normal oral mucosa. MMM.  Lungs: CTA bilaterally. Heart: RRR; no extra sounds or murmurs  Abdomen: Non-distended, + epigastric and suprapubic tenderness, no CVA tenderness. No guarding or rebound tenderness. normal BS. The abdomen is soft with no hepatosplenomegaly  Rectal exam: Not repeated Tailbone  area: Reported tenderness, but not significant with distraction of the patient during the exam. No evidence of inflammation or infection. Extremities: No pedal edema. No joint swelling or tenderness. Neurologic: Normal EOM,  Alert and oriented x3. No obvious neurologic/cranial nerve deficits.  Assessment & Plan:  I have discussed my assessment and plan  with  my attending in the clinic, Dr. Josem Kaufmann as detailed under problem based charting.

## 2013-08-03 NOTE — Patient Instructions (Signed)
Please take tramadol for your pain. Take 50 mg every 8 hrs as needed for pain  It may cause constipation  Tramadol tablets What is this medicine? TRAMADOL (TRA ma dole) is a pain reliever. It is used to treat moderate to severe pain in adults. This medicine may be used for other purposes; ask your health care provider or pharmacist if you have questions. What should I tell my health care provider before I take this medicine? They need to know if you have any of these conditions: -brain tumor -depression -drug abuse or addiction -head injury -if you frequently drink alcohol containing drinks -kidney disease or trouble passing urine -liver disease -lung disease, asthma, or breathing problems -seizures or epilepsy -suicidal thoughts, plans, or attempt; a previous suicide attempt by you or a family member -an unusual or allergic reaction to tramadol, codeine, other medicines, foods, dyes, or preservatives -pregnant or trying to get pregnant -breast-feeding How should I use this medicine? Take this medicine by mouth with a full glass of water. Follow the directions on the prescription label. If the medicine upsets your stomach, take it with food or milk. Do not take more medicine than you are told to take. Talk to your pediatrician regarding the use of this medicine in children. Special care may be needed. Overdosage: If you think you have taken too much of this medicine contact a poison control center or emergency room at once. NOTE: This medicine is only for you. Do not share this medicine with others. What if I miss a dose? If you miss a dose, take it as soon as you can. If it is almost time for your next dose, take only that dose. Do not take double or extra doses. What may interact with this medicine? Do not take this medicine with any of the following medications: -MAOIs like Carbex, Eldepryl, Marplan, Nardil, and Parnate This medicine may also interact with the following  medications: -alcohol or medicines that contain alcohol -antihistamines -benzodiazepines -bupropion -carbamazepine or oxcarbazepine -clozapine -cyclobenzaprine -digoxin -furazolidone -linezolid -medicines for depression, anxiety, or psychotic disturbances -medicines for migraine headache like almotriptan, eletriptan, frovatriptan, naratriptan, rizatriptan, sumatriptan, zolmitriptan -medicines for pain like pentazocine, buprenorphine, butorphanol, meperidine, nalbuphine, and propoxyphene -medicines for sleep -muscle relaxants -naltrexone -phenobarbital -phenothiazines like perphenazine, thioridazine, chlorpromazine, mesoridazine, fluphenazine, prochlorperazine, promazine, and trifluoperazine -procarbazine -warfarin This list may not describe all possible interactions. Give your health care provider a list of all the medicines, herbs, non-prescription drugs, or dietary supplements you use. Also tell them if you smoke, drink alcohol, or use illegal drugs. Some items may interact with your medicine. What should I watch for while using this medicine? Tell your doctor or health care professional if your pain does not go away, if it gets worse, or if you have new or a different type of pain. You may develop tolerance to the medicine. Tolerance means that you will need a higher dose of the medicine for pain relief. Tolerance is normal and is expected if you take this medicine for a long time. Do not suddenly stop taking your medicine because you may develop a severe reaction. Your body becomes used to the medicine. This does NOT mean you are addicted. Addiction is a behavior related to getting and using a drug for a non-medical reason. If you have pain, you have a medical reason to take pain medicine. Your doctor will tell you how much medicine to take. If your doctor wants you to stop the medicine, the dose will be slowly  lowered over time to avoid any side effects. You may get drowsy or dizzy. Do  not drive, use machinery, or do anything that needs mental alertness until you know how this medicine affects you. Do not stand or sit up quickly, especially if you are an older patient. This reduces the risk of dizzy or fainting spells. Alcohol can increase or decrease the effects of this medicine. Avoid alcoholic drinks. You may have constipation. Try to have a bowel movement at least every 2 to 3 days. If you do not have a bowel movement for 3 days, call your doctor or health care professional. Your mouth may get dry. Chewing sugarless gum or sucking hard candy, and drinking plenty of water may help. Contact your doctor if the problem does not go away or is severe. What side effects may I notice from receiving this medicine? Side effects that you should report to your doctor or health care professional as soon as possible: -allergic reactions like skin rash, itching or hives, swelling of the face, lips, or tongue -breathing difficulties, wheezing -confusion -itching -light headedness or fainting spells -redness, blistering, peeling or loosening of the skin, including inside the mouth -seizures Side effects that usually do not require medical attention (report to your doctor or health care professional if they continue or are bothersome): -constipation -dizziness -drowsiness -headache -nausea, vomiting This list may not describe all possible side effects. Call your doctor for medical advice about side effects. You may report side effects to FDA at 1-800-FDA-1088. Where should I keep my medicine? Keep out of the reach of children. Store at room temperature between 15 and 30 degrees C (59 and 86 degrees F). Keep container tightly closed. Throw away any unused medicine after the expiration date. NOTE: This sheet is a summary. It may not cover all possible information. If you have questions about this medicine, talk to your doctor, pharmacist, or health care provider.  2012, [employer]. (06/24/2010 11:55:44 AM)

## 2013-08-03 NOTE — Telephone Encounter (Signed)
Pt called continue to have pain in back, abd and rectal area. Tylenol does not help pain. Talked with Dr Zada Girt and states pt has appt at 2:30PM today. Reminded pt of appt and states she will be in clinic. [name] Kidney Amen Staszak RN 08/03/13 1:30PM

## 2013-08-03 NOTE — Assessment & Plan Note (Signed)
I don't suspect coccyx bone fracture based on physical exam. Patient has complex pain disorder with pain, back pain, and general muscular pain. I asked her specifically what she has used in the past with good relief and she mentioned Vicodin.  Plan  - discussed with the pt that Vicodin is not a good option for her pain with history of SBO/constipation while she was taking it.  - will try Tramadol which has less GI related side effects. Gave her 90 pills. - she will follow with Dr Everardo Beals or prn for if symptoms persist.

## 2013-08-06 NOTE — Progress Notes (Signed)
I saw and evaluated the patient. I personally confirmed the key portions of Dr. Kazibwe's history and exam and reviewed pertinent patient test results. The assessment, diagnosis, and plan were formulated together and I agree with the documentation in the resident's note. 

## 2013-08-06 NOTE — Progress Notes (Signed)
Case discussed with Dr. Kazibwe at the time of the visit.  We reviewed the resident's history and exam and pertinent patient test results.  I agree with the assessment, diagnosis and plan of care documented in the resident's note. 

## 2013-08-08 ENCOUNTER — Emergency Department (HOSPITAL_COMMUNITY)
Admission: EM | Admit: 2013-08-08 | Discharge: 2013-08-08 | Disposition: A | Discharge status: Home / Self Care | Payer: PRIVATE HEALTH INSURANCE | Attending: Emergency Medicine | Admitting: Emergency Medicine

## 2013-08-08 ENCOUNTER — Encounter (HOSPITAL_COMMUNITY): Payer: Self-pay | Admitting: Emergency Medicine

## 2013-08-08 ENCOUNTER — Emergency Department (HOSPITAL_COMMUNITY): Payer: PRIVATE HEALTH INSURANCE

## 2013-08-08 DIAGNOSIS — Z8739 Personal history of other diseases of the musculoskeletal system and connective tissue: Secondary | ICD-10-CM | POA: Insufficient documentation

## 2013-08-08 DIAGNOSIS — K219 Gastro-esophageal reflux disease without esophagitis: Secondary | ICD-10-CM | POA: Insufficient documentation

## 2013-08-08 DIAGNOSIS — I5030 Unspecified diastolic (congestive) heart failure: Secondary | ICD-10-CM | POA: Insufficient documentation

## 2013-08-08 DIAGNOSIS — F603 Borderline personality disorder: Secondary | ICD-10-CM | POA: Insufficient documentation

## 2013-08-08 DIAGNOSIS — K3184 Gastroparesis: Secondary | ICD-10-CM | POA: Insufficient documentation

## 2013-08-08 DIAGNOSIS — R109 Unspecified abdominal pain: Secondary | ICD-10-CM | POA: Insufficient documentation

## 2013-08-08 DIAGNOSIS — F3289 Other specified depressive episodes: Secondary | ICD-10-CM | POA: Insufficient documentation

## 2013-08-08 DIAGNOSIS — Z87891 Personal history of nicotine dependence: Secondary | ICD-10-CM | POA: Insufficient documentation

## 2013-08-08 DIAGNOSIS — F329 Major depressive disorder, single episode, unspecified: Secondary | ICD-10-CM | POA: Insufficient documentation

## 2013-08-08 DIAGNOSIS — E1149 Type 2 diabetes mellitus with other diabetic neurological complication: Secondary | ICD-10-CM | POA: Insufficient documentation

## 2013-08-08 DIAGNOSIS — Z7982 Long term (current) use of aspirin: Secondary | ICD-10-CM | POA: Insufficient documentation

## 2013-08-08 DIAGNOSIS — R739 Hyperglycemia, unspecified: Secondary | ICD-10-CM

## 2013-08-08 DIAGNOSIS — Z794 Long term (current) use of insulin: Secondary | ICD-10-CM | POA: Insufficient documentation

## 2013-08-08 DIAGNOSIS — Z880 Allergy status to penicillin: Secondary | ICD-10-CM | POA: Insufficient documentation

## 2013-08-08 DIAGNOSIS — E538 Deficiency of other specified B group vitamins: Secondary | ICD-10-CM | POA: Insufficient documentation

## 2013-08-08 DIAGNOSIS — N289 Disorder of kidney and ureter, unspecified: Secondary | ICD-10-CM | POA: Insufficient documentation

## 2013-08-08 DIAGNOSIS — I10 Essential (primary) hypertension: Secondary | ICD-10-CM | POA: Insufficient documentation

## 2013-08-08 DIAGNOSIS — I2510 Atherosclerotic heart disease of native coronary artery without angina pectoris: Secondary | ICD-10-CM | POA: Insufficient documentation

## 2013-08-08 DIAGNOSIS — Z79899 Other long term (current) drug therapy: Secondary | ICD-10-CM | POA: Insufficient documentation

## 2013-08-08 DIAGNOSIS — Z8669 Personal history of other diseases of the nervous system and sense organs: Secondary | ICD-10-CM | POA: Insufficient documentation

## 2013-08-08 DIAGNOSIS — D649 Anemia, unspecified: Secondary | ICD-10-CM | POA: Insufficient documentation

## 2013-08-08 DIAGNOSIS — M129 Arthropathy, unspecified: Secondary | ICD-10-CM | POA: Insufficient documentation

## 2013-08-08 LAB — CBC
HCT: 36.2 % (ref 36.0–46.0)
MCHC: 34.8 g/dL (ref 30.0–36.0)
Platelets: 233 10*3/uL (ref 150–400)
RDW: 15.1 % (ref 11.5–15.5)

## 2013-08-08 LAB — URINALYSIS, ROUTINE W REFLEX MICROSCOPIC
Bilirubin Urine: NEGATIVE
Ketones, ur: NEGATIVE mg/dL
Nitrite: NEGATIVE
Specific Gravity, Urine: 1.028 (ref 1.005–1.030)
Urobilinogen, UA: 0.2 mg/dL (ref 0.0–1.0)

## 2013-08-08 LAB — BASIC METABOLIC PANEL
BUN: 36 mg/dL — ABNORMAL HIGH (ref 6–23)
Creatinine, Ser: 1.73 mg/dL — ABNORMAL HIGH (ref 0.50–1.10)
GFR calc Af Amer: 35 mL/min — ABNORMAL LOW (ref >90)
GFR calc non Af Amer: 30 mL/min — ABNORMAL LOW (ref >90)
Potassium: 4.3 mEq/L (ref 3.5–5.1)
Sodium: 132 mEq/L — ABNORMAL LOW (ref 135–145)

## 2013-08-08 LAB — LIPASE, BLOOD: Lipase: 66 U/L — ABNORMAL HIGH (ref 11–59)

## 2013-08-08 LAB — POCT I-STAT TROPONIN I

## 2013-08-08 LAB — URINE MICROSCOPIC-ADD ON

## 2013-08-08 LAB — HEPATIC FUNCTION PANEL
Alkaline Phosphatase: 109 U/L (ref 39–117)
Total Bilirubin: 0.2 mg/dL — ABNORMAL LOW (ref 0.3–1.2)
Total Protein: 8.5 g/dL — ABNORMAL HIGH (ref 6.0–8.3)

## 2013-08-08 MED ORDER — HYDROMORPHONE HCL PF 1 MG/ML IJ SOLN
0.5000 mg | Freq: Once | INTRAMUSCULAR | Status: AC
  Administered 2013-08-08: 0.5 mg via INTRAVENOUS
  Filled 2013-08-08: qty 1

## 2013-08-08 MED ORDER — PROMETHAZINE HCL 25 MG PO TABS: 25.0000 mg | ORAL_TABLET | Freq: Four times a day (QID) | ORAL | Status: DC | PRN

## 2013-08-08 MED ORDER — OXYCODONE-ACETAMINOPHEN 5-325 MG PO TABS: 2.0000 | ORAL_TABLET | ORAL | Status: DC | PRN

## 2013-08-08 MED ORDER — IOHEXOL 300 MG/ML  SOLN
25.0000 mL | INTRAMUSCULAR | Status: AC
  Administered 2013-08-08: 25 mL via ORAL

## 2013-08-08 MED ORDER — SODIUM CHLORIDE 0.9 % IV SOLN
Freq: Once | INTRAVENOUS | Status: AC
  Administered 2013-08-08: 13:00:00 via INTRAVENOUS

## 2013-08-08 MED ORDER — GI COCKTAIL ~~LOC~~
30.0000 mL | Freq: Once | ORAL | Status: AC
  Administered 2013-08-08: 30 mL via ORAL
  Filled 2013-08-08: qty 30

## 2013-08-08 MED ORDER — IOHEXOL 300 MG/ML  SOLN
80.0000 mL | Freq: Once | INTRAMUSCULAR | Status: AC | PRN
  Administered 2013-08-08: 80 mL via INTRAVENOUS

## 2013-08-08 MED ORDER — NITROGLYCERIN 0.4 MG SL SUBL
0.4000 mg | SUBLINGUAL_TABLET | SUBLINGUAL | Status: DC | PRN
  Administered 2013-08-08: 0.4 mg via SUBLINGUAL

## 2013-08-08 MED ORDER — ONDANSETRON HCL 4 MG/2ML IJ SOLN
4.0000 mg | Freq: Once | INTRAMUSCULAR | Status: AC
  Administered 2013-08-08: 4 mg via INTRAVENOUS
  Filled 2013-08-08: qty 2

## 2013-08-08 NOTE — ED Notes (Signed)
Pt finished oral contrast.  CT notified.

## 2013-08-08 NOTE — ED Notes (Signed)
MD made aware of pt pain level.

## 2013-08-08 NOTE — ED Notes (Signed)
Spoke with Selena Batten in the lab reporting they can add on Lipase and Hepatic function panel. Clicked them off in EPIC by this RN.

## 2013-08-08 NOTE — ED Notes (Signed)
EDP made aware that patient still is in pain who no relief.

## 2013-08-08 NOTE — ED Notes (Signed)
Sharp CP since 3am. Woke pt. Any movement makes pts pain increase. Skin w//d

## 2013-08-08 NOTE — ED Notes (Signed)
MD at bedside. 

## 2013-08-11 NOTE — ED Provider Notes (Signed)
CSN: [id number]     Arrival date & time 08/08/13  9528 History   First MD Initiated Contact with Patient 08/08/13 0802     Chief Complaint  Patient presents with  . Chest Pain   (Consider location/radiation/quality/duration/timing/severity/associated sxs/prior Treatment) HPI...Marland KitchenMarland KitchenMarland Kitchen RN reports chest pain as chief complaint.   However patient reports sharp upper abdominal pain for unknown length of time.   Questionable fleeting chest pain;   no fever, sweats, chills, nausea, vomiting, diarrhea, dysuria.   Nothing makes symptoms better or worse. Severity is mild.   No radiation of pain  Past Medical History  Diagnosis Date  . Hypertension   . Peripheral neuropathy   . Cervicalgia   . Low back pain syndrome   . Hyperlipidemia   . Vitamin D deficiency   . Vitamin B12 deficiency   . Iliotibial band syndrome   . Anemia   . Borderline personality disorder   . Nonorganic psychosis   . GERD (gastroesophageal reflux disease)   . Diabetes mellitus   . Depression   . Diabetic gastroparesis   . Sleep apnea   . CAD (coronary artery disease)     Catherization 11/18/09>nonobstructive CAD , normal LV function, EF 65%  . Health maintenance examination     Colonoscopy 2009> normal; Diabetic eye exam 12/2008. No diabetic retinopathy; Mammogram 11/10> No evidence of malignancy, DXA 03/14/13 : normal  . Headache(784.0)   . Arthritis   . SBO (small bowel obstruction) 01/09/2013  . Diastolic CHF     Grade I, on Echo 06/2013, EF 55-60%   Past Surgical History  Procedure Laterality Date  . Abdominal hysterectomy    . Hand surgery    . Bowel resection N/A 01/10/2013    Procedure: SMALL BOWEL RESECTION;  Surgeon: Lodema Pilot, DO;  Location: [facility] OR;  Service: General;  Laterality: N/A;  . Lysis of adhesion N/A 01/10/2013    Procedure: LYSIS OF ADHESION;  Surgeon: Lodema Pilot, DO;  Location: [facility] OR;  Service: General;  Laterality: N/A;  . Laparotomy N/A 01/10/2013    Procedure: EXPLORATORY LAPAROTOMY;   Surgeon: Lodema Pilot, DO;  Location: [facility] OR;  Service: General;  Laterality: N/A;   Family History  Problem Relation Age of Onset  . Diabetes Mother   . Hypertension Mother   . Hyperlipidemia Mother   . Arthritis Mother   . Depression Mother   . Heart disease Father   . Diabetes Sister   . Diabetes Brother   . Heart disease Brother   . Heart disease Paternal Grandmother   . Seizures Brother    History  Substance Use Topics  . Smoking status: Former Smoker    Types: Cigarettes    Quit date: 10/25/2002  . Smokeless tobacco: Not on file  . Alcohol Use: No   OB History   Grav Para Term Preterm Abortions TAB SAB Ect Mult Living                 Review of Systems  All other systems reviewed and are negative.    Allergies  Indomethacin; Penicillins; Cimetidine; and Sulfonamide derivatives  Home Medications   Current Outpatient Rx  Name  Route  Sig  Dispense  Refill  . amitriptyline (ELAVIL) 25 MG tablet   Oral   Take 1 tablet (25 mg total) by mouth at bedtime.   30 tablet   1   . aspirin 81 MG tablet   Oral   Take 81 mg by mouth daily.           Marland Kitchen  buPROPion (WELLBUTRIN XL) 300 MG 24 hr tablet   Oral   Take 1 tablet (300 mg total) by mouth daily.   30 tablet   5   . dexlansoprazole (DEXILANT) 60 MG capsule   Oral   Take 60 mg by mouth daily.           . diclofenac sodium (VOLTAREN) 1 % GEL   Topical   Apply 2 g topically 3 (three) times daily as needed (pain relief).         Tery Sanfilippo Sodium (COLACE PO)   Oral   Take 2 tablets by mouth daily as needed (constipation).         . ferrous sulfate 325 (65 FE) MG tablet   Oral   Take 1 tablet (325 mg total) by mouth 3 (three) times daily with meals.   30 tablet   3   . FLUoxetine (PROZAC) 20 MG capsule   Oral   Take 1 capsule (20 mg total) by mouth every morning.   30 capsule   5   . furosemide (LASIX) 40 MG tablet   Oral   Take 1 tablet (40 mg total) by mouth daily.   30 tablet   11    . gabapentin (NEURONTIN) 800 MG tablet   Oral   Take 800-1,600 mg by mouth 2 (two) times daily. 800 mg in the am and 1600 mg in the evening.         . insulin aspart (NOVOLOG FLEXPEN) 100 UNIT/ML SOPN FlexPen   Subcutaneous   Inject 10 Units into the skin 3 (three) times daily with meals.   5 pen   12   . insulin glargine (LANTUS) 100 UNIT/ML injection   Subcutaneous   Inject 0.5 mLs (50 Units total) into the skin at bedtime.   3 pen   12   . isosorbide mononitrate (IMDUR) 60 MG 24 hr tablet   Oral   Take 1 tablet (60 mg total) by mouth daily.   30 tablet   6   . losartan (COZAAR) 100 MG tablet   Oral   Take 1 tablet (100 mg total) by mouth daily.   30 tablet   11   . metoprolol succinate (TOPROL-XL) 25 MG 24 hr tablet   Oral   Take 1 tablet (25 mg total) by mouth daily.   30 tablet   11   . traMADol (ULTRAM) 50 MG tablet   Oral   Take 1 tablet (50 mg total) by mouth every 8 (eight) hours as needed for pain.   90 tablet   0   . ACCU-CHEK FASTCLIX LANCETS MISC   Does not apply   1 each by Does not apply route 3 (three) times daily before meals. Dx code 250.00   102 each   12   . glucose blood (ACCU-CHEK SMARTVIEW) test strip      Use to check blood sugar before meals dx code 250.00   100 each   12   . Insulin Pen Needle 31G X 5 MM MISC      Box of 100 - uses up to 5 times per day according to CBG   100 each   11   . oxyCODONE-acetaminophen (PERCOCET) 5-325 MG per tablet   Oral   Take 2 tablets by mouth every 4 (four) hours as needed for pain.   20 tablet   0   . promethazine (PHENERGAN) 25 MG tablet   Oral   Take 1  tablet (25 mg total) by mouth every 6 (six) hours as needed for nausea.   20 tablet   0   . senna (SENOKOT) 8.6 MG TABS   Oral   Take 4 tablets by mouth daily as needed (constipation).          BP 150/79  Pulse 88  Temp(Src) 97.7 F (36.5 C) (Oral)  Resp 18  Ht 5\' 5"  (1.651 m)  Wt 208 lb (94.348 kg)  BMI 34.61 kg/m2   SpO2 100% Physical Exam  Nursing note and vitals reviewed. Constitutional: She is oriented to person, place, and time. She appears well-developed and well-nourished.  HENT:  Head: Normocephalic and atraumatic.  Eyes: Conjunctivae and EOM are normal. Pupils are equal, round, and reactive to light.  Neck: Normal range of motion. Neck supple.  Cardiovascular: Normal rate, regular rhythm and normal heart sounds.   Pulmonary/Chest: Effort normal and breath sounds normal.  Abdominal: Soft. Bowel sounds are normal.  Minimal upper abdominal central  tenderness  Musculoskeletal: Normal range of motion.  Neurological: She is alert and oriented to person, place, and time.  Skin: Skin is warm and dry.  Psychiatric: She has a normal mood and affect.    ED Course  Procedures (including critical care time) Labs Review Labs Reviewed  CBC - Abnormal; Notable for the following:    MCV 74.9 (*)    All other components within normal limits  BASIC METABOLIC PANEL - Abnormal; Notable for the following:    Sodium 132 (*)    Chloride 95 (*)    Glucose, Bld 366 (*)    BUN 36 (*)    Creatinine, Ser 1.73 (*)    GFR calc non Af Amer 30 (*)    GFR calc Af Amer 35 (*)    All other components within normal limits  HEPATIC FUNCTION PANEL - Abnormal; Notable for the following:    Total Protein 8.5 (*)    Total Bilirubin 0.2 (*)    All other components within normal limits  LIPASE, BLOOD - Abnormal; Notable for the following:    Lipase 66 (*)    All other components within normal limits  URINALYSIS, ROUTINE W REFLEX MICROSCOPIC - Abnormal; Notable for the following:    Glucose, UA >1000 (*)    Protein, ur 30 (*)    All other components within normal limits  URINE MICROSCOPIC-ADD ON - Abnormal; Notable for the following:    Casts HYALINE CASTS (*)    All other components within normal limits  POCT I-STAT TROPONIN I   Imaging Review No results found.  EKG Interpretation   None       MDM   1.  Abdominal pain   2. Hyperglycemia   3. Renal insufficiency    no acute abdomen. CT scan reveals small umbilical and right. Umbilical hernia but no obstruction. Discharge medications Percocet and Phenergan 25 mg    Donnetta Hutching, MD 08/11/13 1609

## 2013-08-16 ENCOUNTER — Ambulatory Visit (INDEPENDENT_AMBULATORY_CARE_PROVIDER_SITE_OTHER): Payer: 59 | Admitting: Licensed Clinical Social Worker

## 2013-08-16 DIAGNOSIS — F332 Major depressive disorder, recurrent severe without psychotic features: Secondary | ICD-10-CM

## 2013-08-16 NOTE — Progress Notes (Signed)
   THERAPIST PROGRESS NOTE  Session Time: 3:00pm-3:30pm  Participation Level: Active  Behavioral Response: Well GroomedAlertDepressed and Irritable  Type of Therapy: Individual Therapy  Treatment Goals addressed: Coping  Interventions: CBT, Motivational Interviewing, Strength-based, Supportive and Reframing  Summary: [name] is a 62 y.o. female who presents with depressed mood and flat affect. She reports ongoing stomach pain and her doctors are still unable to find a cause for her pain. She questions if she is "making it up" but states that her pain is very real. She goes to the ED often when the pain is unbearable but the treatment they provide does not really help. When asked why she continues to go, she reports that she is hopeful that they can help her the next time. She reports waking up agitated and angry. She is upset with her husband often and she realizes that many times she is upset for nothing. She does not feel supported and she is isolating herself from church because she feels poorly. Her sleep has increased and her appetite remains the same despite her pain. She plans to get a second opinion at [facility] on Monday.   Suicidal/Homicidal: Nowithout intent/plan  Therapist Response: Assessed patients current functioning and reviewed progress. Reviewed coping strategies. Assessed patients safety and assisted in identifying protective factors.  Reviewed crisis plan with patient. Assisted patient with the expression of sadness and frustration. Reviewed patients self care plan. Assessed progress related to self care. Patients self care is fair. Recommend daily exercise, increased socialization and recreation. Used CBT to assist patient with the identification of negative distortions and irrational thoughts. Encouraged patient to verbalize alternative and factual responses which challenge thought distortions. Used motivational interviewing to assist and encourage patient through the  change process. Explored patients barriers to change.   Plan: Return again in three weeks.  Diagnosis: Axis I: Major Depression, Recurrent severe    Axis II: No diagnosis    [name] Boehning, LCSW 08/16/2013

## 2013-08-21 ENCOUNTER — Encounter: Payer: PRIVATE HEALTH INSURANCE | Attending: Physical Medicine & Rehabilitation

## 2013-08-21 ENCOUNTER — Encounter: Payer: Self-pay | Admitting: Physical Medicine & Rehabilitation

## 2013-08-21 ENCOUNTER — Ambulatory Visit (HOSPITAL_BASED_OUTPATIENT_CLINIC_OR_DEPARTMENT_OTHER): Payer: PRIVATE HEALTH INSURANCE | Admitting: Physical Medicine & Rehabilitation

## 2013-08-21 VITALS — BP 119/68 | HR 84 | Resp 14 | Ht 65.0 in | Wt 207.8 lb

## 2013-08-21 DIAGNOSIS — E1149 Type 2 diabetes mellitus with other diabetic neurological complication: Secondary | ICD-10-CM | POA: Insufficient documentation

## 2013-08-21 DIAGNOSIS — Z79899 Other long term (current) drug therapy: Secondary | ICD-10-CM | POA: Insufficient documentation

## 2013-08-21 DIAGNOSIS — R079 Chest pain, unspecified: Secondary | ICD-10-CM

## 2013-08-21 DIAGNOSIS — M797 Fibromyalgia: Secondary | ICD-10-CM

## 2013-08-21 DIAGNOSIS — M47817 Spondylosis without myelopathy or radiculopathy, lumbosacral region: Secondary | ICD-10-CM | POA: Insufficient documentation

## 2013-08-21 DIAGNOSIS — G8929 Other chronic pain: Secondary | ICD-10-CM

## 2013-08-21 DIAGNOSIS — IMO0001 Reserved for inherently not codable concepts without codable children: Secondary | ICD-10-CM

## 2013-08-21 DIAGNOSIS — M810 Age-related osteoporosis without current pathological fracture: Secondary | ICD-10-CM

## 2013-08-21 DIAGNOSIS — I2510 Atherosclerotic heart disease of native coronary artery without angina pectoris: Secondary | ICD-10-CM | POA: Insufficient documentation

## 2013-08-21 DIAGNOSIS — M1710 Unilateral primary osteoarthritis, unspecified knee: Secondary | ICD-10-CM | POA: Insufficient documentation

## 2013-08-21 DIAGNOSIS — E1142 Type 2 diabetes mellitus with diabetic polyneuropathy: Secondary | ICD-10-CM | POA: Insufficient documentation

## 2013-08-21 DIAGNOSIS — Z5181 Encounter for therapeutic drug level monitoring: Secondary | ICD-10-CM

## 2013-08-21 MED ORDER — DIAZEPAM 5 MG PO TABS: 5.0000 mg | ORAL_TABLET | Freq: Once | ORAL | Status: DC

## 2013-08-21 MED ORDER — AMITRIPTYLINE HCL 25 MG PO TABS: 25.0000 mg | ORAL_TABLET | Freq: Every day | ORAL | Status: DC

## 2013-08-21 NOTE — Patient Instructions (Signed)
Continue tramadol Continue meloxicam Prescription for amitriptyline  Will do lumbar medial branch blocks next visit

## 2013-08-21 NOTE — Progress Notes (Signed)
Subjective:    Patient ID: [name], female    DOB: [birth year], 62 y.o.   MRN: [id number] Chief complaint low back pain HPI 62 year old female who is been seen in this clinic between 2005 in 2008 for axial back pain. Was treated with tramadol 100 mg 4 times a day, amitriptyline 50 mg at night, no injections were performed at that time. Patient was lost medical followup at this clinic for a number of years. She now returns saying that her back pain has increased again. Interval history positive for coronary artery disease, diabetes with neuropathy. Now is on gabapentin. Has been recently restarted on tramadol 50 mg 3 times a day which does not seem to be very helpful. Has also been started on meloxicam 15 mg a day just today for inflammation of the stomach lining. This was started by gastroenterologist at [facility]  Had physical therapy for bilateral knee arthritis in June of this year. This was helpful. She also wears knee braces when her pain gets worse.  No sciatic pain. Toe numbness right side greater than left side. Also has numbness in the fingers of the right hand except for the little finger   Pain Inventory Average Pain 4 Pain Right Now 4 My pain is dull  In the last 24 hours, has pain interfered with the following? General activity 7 Relation with others 7 Enjoyment of life 7 What TIME of day is your pain at its worst? varies Sleep (in general) Fair  Pain is worse with: walking, standing and some activites Pain improves with: rest and medication Relief from Meds: 6  Mobility walk without assistance ability to climb steps?  yes do you drive?  yes  Function disabled: date disabled 1999 I need assistance with the following:  household duties  Neuro/Psych numbness tingling spasms depression anxiety  Prior Studies Any changes since last visit?  no  Physicians involved in your care Any changes since last visit?  no   Family History  Problem  Relation Age of Onset  . Diabetes Mother   . Hypertension Mother   . Hyperlipidemia Mother   . Arthritis Mother   . Depression Mother   . Heart disease Father   . Diabetes Sister   . Diabetes Brother   . Heart disease Brother   . Heart disease Paternal Grandmother   . Seizures Brother    History   Social History  . Marital Status: Married    Spouse Name: N/A    Number of Children: N/A  . Years of Education: N/A   Social History Main Topics  . Smoking status: Former Smoker    Types: Cigarettes    Quit date: 10/25/2002  . Smokeless tobacco: None  . Alcohol Use: No  . Drug Use: No  . Sexual Activity: Yes   Other Topics Concern  . None   Social History Narrative  . None   Past Surgical History  Procedure Laterality Date  . Abdominal hysterectomy    . Hand surgery    . Bowel resection N/A 01/10/2013    Procedure: SMALL BOWEL RESECTION;  Surgeon: Lodema Pilot, DO;  Location: [facility] OR;  Service: General;  Laterality: N/A;  . Lysis of adhesion N/A 01/10/2013    Procedure: LYSIS OF ADHESION;  Surgeon: Lodema Pilot, DO;  Location: [facility] OR;  Service: General;  Laterality: N/A;  . Laparotomy N/A 01/10/2013    Procedure: EXPLORATORY LAPAROTOMY;  Surgeon: Lodema Pilot, DO;  Location: [facility] OR;  Service: General;  Laterality: N/A;  Past Medical History  Diagnosis Date  . Hypertension   . Peripheral neuropathy   . Cervicalgia   . Low back pain syndrome   . Hyperlipidemia   . Vitamin D deficiency   . Vitamin B12 deficiency   . Iliotibial band syndrome   . Anemia   . Borderline personality disorder   . Nonorganic psychosis   . GERD (gastroesophageal reflux disease)   . Diabetes mellitus   . Depression   . Diabetic gastroparesis   . Sleep apnea   . CAD (coronary artery disease)     Catherization 11/18/09>nonobstructive CAD , normal LV function, EF 65%  . Health maintenance examination     Colonoscopy 2009> normal; Diabetic eye exam 12/2008. No diabetic retinopathy; Mammogram  11/10> No evidence of malignancy, DXA 03/14/13 : normal  . Headache(784.0)   . Arthritis   . SBO (small bowel obstruction) 01/09/2013  . Diastolic CHF     Grade I, on Echo 06/2013, EF 55-60%   BP 119/68  Pulse 84  Resp 14  Ht 5\' 5"  (1.651 m)  Wt 207 lb 12.8 oz (94.257 kg)  BMI 34.58 kg/m2  SpO2 93%     Review of Systems  Constitutional: Positive for diaphoresis and unexpected weight change.  Respiratory: Positive for apnea.   Gastrointestinal: Positive for nausea and abdominal pain.  Endocrine:       High blood sugar  Neurological: Positive for numbness.       Tingling, spasms  Psychiatric/Behavioral: Positive for dysphoric mood. The patient is nervous/anxious.        Objective:   Physical Exam  Nursing note and vitals reviewed. Constitutional: She is oriented to person, place, and time. She appears well-developed and well-nourished.  HENT:  Head: Normocephalic and atraumatic.  Eyes: Conjunctivae and EOM are normal. Pupils are equal, round, and reactive to light.  Musculoskeletal:       Right shoulder: Normal.       Left shoulder: Normal.       Right hip: Normal.       Left hip: She exhibits decreased range of motion.       Cervical back: Normal.       Thoracic back: Normal. She exhibits no bony tenderness.       Lumbar back: She exhibits decreased range of motion, tenderness and bony tenderness. She exhibits no deformity.  Neurological: She is alert and oriented to person, place, and time. She has normal strength. A sensory deficit is present. Coordination and gait normal.  Reflex Scores:      Tricep reflexes are 0 on the right side and 0 on the left side.      Bicep reflexes are 0 on the right side and 0 on the left side.      Brachioradialis reflexes are 0 on the right side and 0 on the left side.      Patellar reflexes are 0 on the right side and 0 on the left side.      Achilles reflexes are 0 on the right side and 0 on the left side. Decreased sensation right  little toe as well as right greater than left index finger and middle finger  Psychiatric: She has a normal mood and affect.          Assessment & Plan:  1.  Lumbar spondylosis will eval/tx with Bilateral L3-4-5 MBB next visit- cont tramadol 2.  Diabetic neuropathy with pain and numbness, sleep disturbance will cont elavil

## 2013-08-30 ENCOUNTER — Ambulatory Visit (INDEPENDENT_AMBULATORY_CARE_PROVIDER_SITE_OTHER): Payer: 59 | Admitting: Licensed Clinical Social Worker

## 2013-08-30 DIAGNOSIS — F332 Major depressive disorder, recurrent severe without psychotic features: Secondary | ICD-10-CM

## 2013-08-30 NOTE — Progress Notes (Signed)
   THERAPIST PROGRESS NOTE  Session Time: 11:30am-12:20pm  Participation Level: Active  Behavioral Response: Well GroomedAlertDepressed and Euthymic  Type of Therapy: Individual Therapy  Treatment Goals addressed: Coping  Interventions: CBT, Motivational Interviewing, Strength-based, Supportive and Reframing  Summary: [name] is a 62 y.o. female who presents with euthymic mood and anxious affect. She reports improvement in her depression, anxiety and degree of agitation due to improvement in her psychical health. A new doctor was able to diagnose her with stomach inflammation and she is now receiving proper treatment. She still experiences more frustration with her husband than she would prefer. She is trying to accept that he cannot meet all her needs. Now that she is feeling better, she is trying to focus on improved self care and re-establishing social connections and recreational outlets. Her sleep and appetite are wnl.  Suicidal/Homicidal: Nowithout intent/plan  Therapist Response: Assessed patients current functioning and reviewed progress. Reviewed coping strategies. Assessed patients safety and assisted in identifying protective factors.  Reviewed crisis plan with patient. Assisted patient with the expression of frustration with her husband. Reviewed patients self care plan. Assessed progress related to self care. Patients self care is fair, but improving. Recommend daily exercise, increased socialization and recreation. Used CBT to assist patient with the identification of negative distortions and irrational thoughts. Encouraged patient to verbalize alternative and factual responses which challenge thought distortions. Used motivational interviewing to assist and encourage patient through the change process. Explored patients barriers to change.   Plan: Return again in two weeks.  Diagnosis: Axis I: Major Depression, Recurrent severe    Axis II: No  diagnosis    Joyclyn Plazola, LCSW 08/30/2013

## 2013-09-12 ENCOUNTER — Encounter: Payer: Self-pay | Admitting: Internal Medicine

## 2013-09-12 ENCOUNTER — Telehealth: Payer: Self-pay | Admitting: Licensed Clinical Social Worker

## 2013-09-12 ENCOUNTER — Encounter: Payer: Self-pay | Admitting: Licensed Clinical Social Worker

## 2013-09-12 ENCOUNTER — Ambulatory Visit (INDEPENDENT_AMBULATORY_CARE_PROVIDER_SITE_OTHER): Payer: PRIVATE HEALTH INSURANCE | Admitting: Internal Medicine

## 2013-09-12 VITALS — BP 121/85 | HR 85 | Temp 97.8°F | Wt 206.0 lb

## 2013-09-12 DIAGNOSIS — I10 Essential (primary) hypertension: Secondary | ICD-10-CM

## 2013-09-12 DIAGNOSIS — M1990 Unspecified osteoarthritis, unspecified site: Secondary | ICD-10-CM

## 2013-09-12 DIAGNOSIS — M479 Spondylosis, unspecified: Secondary | ICD-10-CM

## 2013-09-12 DIAGNOSIS — I129 Hypertensive chronic kidney disease with stage 1 through stage 4 chronic kidney disease, or unspecified chronic kidney disease: Secondary | ICD-10-CM

## 2013-09-12 DIAGNOSIS — R1013 Epigastric pain: Secondary | ICD-10-CM

## 2013-09-12 DIAGNOSIS — E1165 Type 2 diabetes mellitus with hyperglycemia: Secondary | ICD-10-CM

## 2013-09-12 LAB — GLUCOSE, CAPILLARY: Glucose-Capillary: 415 mg/dL — ABNORMAL HIGH (ref 70–99)

## 2013-09-12 LAB — POCT GLYCOSYLATED HEMOGLOBIN (HGB A1C): Hemoglobin A1C: 13.4

## 2013-09-12 MED ORDER — INSULIN GLARGINE 100 UNIT/ML ~~LOC~~ SOLN: 55.0000 [IU] | Freq: Every day | SUBCUTANEOUS | Status: DC

## 2013-09-12 MED ORDER — FREESTYLE SYSTEM KIT: 1.0000 | PACK | Status: DC | PRN

## 2013-09-12 MED ORDER — PROMETHAZINE HCL 25 MG PO TABS: 25.0000 mg | ORAL_TABLET | Freq: Four times a day (QID) | ORAL | Status: DC | PRN

## 2013-09-12 NOTE — Assessment & Plan Note (Addendum)
BP Readings from Last 3 Encounters:  09/12/13 121/85  08/21/13 119/68  08/08/13 150/79    Lab Results  Component Value Date   NA 132* 08/08/2013   K 4.3 08/08/2013   CREATININE 1.73* 08/08/2013    Assessment: Blood pressure control: controlled Progress toward BP goal:  at goal  Plan: Medications:  continue current medications - lasix, imdur, losartan, metoprolol Educational resources provided: brochure  BMET ordered today, as Cr had been progressing and she was taking mobic, she left without stopping at lab, will return in AM

## 2013-09-12 NOTE — Assessment & Plan Note (Signed)
Scheduled for back injection, but asked that she post pone due to elevated CBGs in 400s. She is being treated at pain clinic, and taking tramadol

## 2013-09-12 NOTE — Assessment & Plan Note (Addendum)
Lab Results  Component Value Date   HGBA1C 13.4 09/12/2013   HGBA1C 7.2 05/23/2013   HGBA1C 7.3 02/21/2013     Assessment: Diabetes control: poor control (HgbA1C >9%) Progress toward A1C goal:  deteriorated  Plan: Medications:  Increase lantus to 55u qHS, encourage scheduled use of novolog 10u TIDWC, ordered new glucometer, [facility] referral for medication support Home glucose monitoring: Frequency: 3 times a day Timing: before breakfast;before meals;at bedtime Instruction/counseling given: reminded to get eye exam and reminded to bring blood glucose meter & log to each visit Educational resources provided: brochure Self management tools provided: home glucose logbook

## 2013-09-12 NOTE — Assessment & Plan Note (Signed)
Has been seen at [facility], and thought to have abdominal wall inflammation for which they have been treating her with mobic. She has received no relief from mobic, and given renal function and lack of benefit, I asked that she d/c this medication (has tried for almost 1 month).

## 2013-09-12 NOTE — Patient Instructions (Signed)
General Instructions:  -I have prescribed phenergan for your nausea, but the big issue is getting your blood sugar under control, otherwise your abdominal pain and dryness will only get worst.   -We are going to tackle your diabetes!!        1. We are getting [facility] involved!      2. We are going to increase your lantus to 55u every night. We also need to be sure you are taking                     your meal time insulin three times (10 u each time) per day      3. Please get a new glucose meter, and check your blood sugar three times a day (before breakfast,                     before lunch and at bedtime)  -We are going to get you scheduled with another eye doctor  -Please stop taking Mobic,  -We are checking your kidney function today  Treatment Goals:  Goals (1 Years of Data) as of 09/12/13         As of Today 08/21/13 08/08/13 08/08/13 08/08/13     Blood Pressure    . Blood Pressure < 150/90  121/85 119/68 150/79 113/69 121/75     Result Component    . HEMOGLOBIN A1C < 7.0  13.4        . LDL CALC < 100            Progress Toward Treatment Goals:  Treatment Goal 09/12/2013  Hemoglobin A1C deteriorated  Blood pressure at goal    Self Care Goals & Plans:  Self Care Goal 09/12/2013  Manage my medications take my medicines as prescribed; refill my medications on time  Monitor my health keep track of my blood glucose; bring my glucose meter and log to each visit  Eat healthy foods eat foods that are low in salt; eat baked foods instead of fried foods  Be physically active find an activity I enjoy  Meeting treatment goals maintain the current self-care plan    Home Blood Glucose Monitoring 09/12/2013  Check my blood sugar 3 times a day  When to check my blood sugar before breakfast; before meals; at bedtime     Care Management & Community Referrals:  Referral 05/23/2013  Referrals made for care management support none needed

## 2013-09-12 NOTE — Progress Notes (Signed)
Subjective:   Patient ID: [name] female   DOB: Mar 10, 1951 62 y.o.   MRN: [id number]  Chief Complaint  Patient presents with  . Abdominal Pain    c/o decrease appetite, bloating, nausea off and on x 1wk  . Back Pain    chronic    HPI: [name] is a 62 y.o. woman with DM, GERD & HTN who presents for routine follow up.  To review, my last visit with her was on 07/12/13, and in the interim she has been seen in [facility] for a UTI and abdominal pain.  She is also being followed by a LCSW for psychotherapy Geanie Berlin), been seen by Dr. Wynn Banker for low back pain mgmt (on tramadol & elavil - 08/21/13) and finally was seen in the ED on 08/08/13 non specific abdominal pain, for which she is followed by Dr. Matthias Hughs (who has subsequently referred her to [facility] digestive [facility], most recent Abd CT was 08/08/13 --> visit was on 10/27, told she had inflammation of abdominal wall and prescribed mobic 15mg  once daily - doesn't feel like this medication is helping, feels like it is hurting across abdomen (like a band), feels achy, doesn't vomit, just feels nauseous).  Regular BMs if she takes laxatives daily.   Regarding DM: Sugars have been "out of wack" - doesn't know what's happened, she forgets to take insulin and forgets to check CBGs; meter is broken now. Misses mealtime insulin, but takes lantus every night. No feelings of hypoglycemia, but describes wooziness when she stands up.  Weak through body. A1c 7.2 --> 13.5.  No recent oral steroids, but has gotten steroid injections, but that was back in July/Aug 2014. Reports poor appetite, today ate a pear and banana.  Will eat cheese, ham, pickles, crackers, but limited due to stomach pain. Chili beans for dinner yesterday.   Regarding health maintenance, she is due for the flu vaccine and A1c, as well as her eye exam (has tried to call Dr. Mitzi Davenport to make appt - will make appt with Dr. Dione Booze.).  She declines the flu vaccine.     Review of  Systems: Constitutional: Denies fever, chills, diaphoresis. Stable weight (206 lbs tdoay, over the last month she has ranted from 203-208 lbs) HEENT: Denies photophobia, eye pain, redness, hearing loss, ear pain, congestion, sore throat, rhinorrhea, sneezing, mouth sores, trouble swallowing, neck pain, neck stiffness and tinnitus.  Respiratory: Denies cough, and wheezing.  Cardiovascular: Denies palpitations  Gastrointestinal: decreased appetite, bloating, nausea, chronic abdominal pain Genitourinary: Denies dysuria, urgency, frequency, hematuria, flank pain and difficulty urinating.  Musculoskeletal: chronic low back pain (MR 2011, mild right facet joint arthritis L4-5, mild-mod at L5-S1) Skin: Denies pallor, rash and wound.  Neurological: Denies seizures, syncope, numbness   Past Medical History  Diagnosis Date  . Hypertension   . Peripheral neuropathy   . Cervicalgia   . Low back pain syndrome   . Hyperlipidemia   . Vitamin D deficiency   . Vitamin B12 deficiency   . Iliotibial band syndrome   . Anemia   . Borderline personality disorder   . Nonorganic psychosis   . GERD (gastroesophageal reflux disease)   . Diabetes mellitus   . Depression   . Diabetic gastroparesis   . Sleep apnea   . CAD (coronary artery disease)     Catherization 11/18/09>nonobstructive CAD , normal LV function, EF 65%  . Health maintenance examination     Colonoscopy 2009> normal; Diabetic eye exam 12/2008. No diabetic retinopathy; Mammogram  11/10> No evidence of malignancy, DXA 03/14/13 : normal  . Headache(784.0)   . Arthritis   . SBO (small bowel obstruction) 01/09/2013  . Diastolic CHF     Grade I, on Echo 06/2013, EF 55-60%   Current Outpatient Prescriptions  Medication Sig Dispense Refill  . ACCU-CHEK FASTCLIX LANCETS MISC 1 each by Does not apply route 3 (three) times daily before meals. Dx code 250.00  102 each  12  . amitriptyline (ELAVIL) 25 MG tablet Take 1 tablet (25 mg total) by mouth at  bedtime.  30 tablet  1  . aspirin 81 MG tablet Take 81 mg by mouth daily.        Marland Kitchen buPROPion (WELLBUTRIN XL) 300 MG 24 hr tablet Take 1 tablet (300 mg total) by mouth daily.  30 tablet  5  . dexlansoprazole (DEXILANT) 60 MG capsule Take 60 mg by mouth daily.        . diazepam (VALIUM) 5 MG tablet Take 1 tablet (5 mg total) by mouth once.  1 tablet  2  . diclofenac sodium (VOLTAREN) 1 % GEL Apply 2 g topically 3 (three) times daily as needed (pain relief).      Tery Sanfilippo Sodium (COLACE PO) Take 2 tablets by mouth daily as needed (constipation).      . ferrous sulfate 325 (65 FE) MG tablet Take 1 tablet (325 mg total) by mouth 3 (three) times daily with meals.  30 tablet  3  . FLUoxetine (PROZAC) 20 MG capsule Take 1 capsule (20 mg total) by mouth every morning.  30 capsule  5  . furosemide (LASIX) 40 MG tablet Take 1 tablet (40 mg total) by mouth daily.  30 tablet  11  . gabapentin (NEURONTIN) 800 MG tablet Take 800-1,600 mg by mouth 2 (two) times daily. 800 mg in the am and 1600 mg in the evening.      Marland Kitchen glucose blood (ACCU-CHEK SMARTVIEW) test strip Use to check blood sugar before meals dx code 250.00  100 each  12  . insulin aspart (NOVOLOG FLEXPEN) 100 UNIT/ML SOPN FlexPen Inject 10 Units into the skin 3 (three) times daily with meals.  5 pen  12  . insulin glargine (LANTUS) 100 UNIT/ML injection Inject 0.5 mLs (50 Units total) into the skin at bedtime.  3 pen  12  . Insulin Pen Needle 31G X 5 MM MISC Box of 100 - uses up to 5 times per day according to CBG  100 each  11  . isosorbide mononitrate (IMDUR) 60 MG 24 hr tablet Take 1 tablet (60 mg total) by mouth daily.  30 tablet  6  . losartan (COZAAR) 100 MG tablet Take 1 tablet (100 mg total) by mouth daily.  30 tablet  11  . losartan-hydrochlorothiazide (HYZAAR) 100-25 MG per tablet       . meloxicam (MOBIC) 15 MG tablet       . metoprolol succinate (TOPROL-XL) 25 MG 24 hr tablet Take 1 tablet (25 mg total) by mouth daily.  30 tablet  11  .  oxyCODONE-acetaminophen (PERCOCET) 5-325 MG per tablet Take 2 tablets by mouth every 4 (four) hours as needed for pain.  20 tablet  0  . promethazine (PHENERGAN) 25 MG tablet Take 1 tablet (25 mg total) by mouth every 6 (six) hours as needed for nausea.  20 tablet  0  . senna (SENOKOT) 8.6 MG TABS Take 4 tablets by mouth daily as needed (constipation).      Marland Kitchen  traMADol (ULTRAM) 50 MG tablet Take 1 tablet (50 mg total) by mouth every 8 (eight) hours as needed for pain.  90 tablet  0   No current facility-administered medications for this visit.   Family History  Problem Relation Age of Onset  . Diabetes Mother   . Hypertension Mother   . Hyperlipidemia Mother   . Arthritis Mother   . Depression Mother   . Heart disease Father   . Diabetes Sister   . Diabetes Brother   . Heart disease Brother   . Heart disease Paternal Grandmother   . Seizures Brother    History   Social History  . Marital Status: Married    Spouse Name: N/A    Number of Children: N/A  . Years of Education: N/A   Social History Main Topics  . Smoking status: Former Smoker    Types: Cigarettes    Quit date: 10/25/2002  . Smokeless tobacco: None  . Alcohol Use: No  . Drug Use: No  . Sexual Activity: Yes   Other Topics Concern  . None   Social History Narrative  . None    Objective:  Physical Exam: Filed Vitals:   09/12/13 1353  BP: 121/85  Pulse: 85  Temp: 97.8 F (36.6 C)  TempSrc: Oral  Weight: 206 lb (93.441 kg)  SpO2: 98%   General: appears depressed HEENT: PERRL, EOMI, no scleral icterus, top dentures in place Cardiac: RRR, no rubs, murmurs or gallops Pulm: clear to auscultation bilaterally, moving normal volumes of air Abd: soft, diffusely mildly tender, worst in epigastric region, bloated, nondistended, BS normoactive  Ext: warm and well perfused, trace pedal edema Neuro: alert and oriented X3, cranial nerves II-XII grossly intact   Assessment & Plan:  Case and care discussed with  Dr. Rogelia Boga.  Please see problem oriented charting for further details. Patient to return in 6 weeks for CBG/DM followup.

## 2013-09-12 NOTE — Assessment & Plan Note (Signed)
Cr ~1.7, had been around 1.3-1.4 a few months prior, but has been as high as 1.8 in the past.  Given her high CBGs and recent Cr progression in the setting of taking mobic, I ordered a BMET, but she did not stop at the lab, she will return for blood work in the morning.

## 2013-09-12 NOTE — Telephone Encounter (Signed)
[name] placed call to CSW to inquire about referral to [facility], as discussed earlier in the year by CSW.  However, pt's insurance is no longer showing Medicaid [place] Access [place], but pt does have [employer] Medicare.  CSW discussed option of referral to [facility] rather than [facility].  CSW inquired as to if pt was experiencing a specific issue that she needed assistance.  [name] states she is in need of new eyeglasses, has an appt this afternoon with PCP for referral.  CSW informed [name] of program call [employer].  CSW will leave information with nursing staff regarding application for program.  Once pt has an updated eyeglass prescription, [facility] CSW can continue with application for eyeglasses.  CSW will also leave information regarding [facility].

## 2013-09-13 ENCOUNTER — Ambulatory Visit (INDEPENDENT_AMBULATORY_CARE_PROVIDER_SITE_OTHER): Payer: 59 | Admitting: Licensed Clinical Social Worker

## 2013-09-13 ENCOUNTER — Other Ambulatory Visit (INDEPENDENT_AMBULATORY_CARE_PROVIDER_SITE_OTHER): Payer: PRIVATE HEALTH INSURANCE

## 2013-09-13 DIAGNOSIS — F332 Major depressive disorder, recurrent severe without psychotic features: Secondary | ICD-10-CM

## 2013-09-13 LAB — BASIC METABOLIC PANEL
BUN: 35 mg/dL — ABNORMAL HIGH (ref 6–23)
Calcium: 10 mg/dL (ref 8.4–10.5)
Chloride: 97 mEq/L (ref 96–112)
Creat: 2.12 mg/dL — ABNORMAL HIGH (ref 0.50–1.10)
Sodium: 132 mEq/L — ABNORMAL LOW (ref 135–145)

## 2013-09-13 LAB — FERRITIN: Ferritin: 62 ng/mL (ref 10–291)

## 2013-09-13 NOTE — Progress Notes (Signed)
Case discussed with Dr. Sharda soon after the resident saw the patient.  We reviewed the resident's history and exam and pertinent patient test results.  I agree with the assessment, diagnosis, and plan of care documented in the resident's note. 

## 2013-09-13 NOTE — Progress Notes (Signed)
   THERAPIST PROGRESS NOTE  Session Time: 11:30am-12:20pm  Participation Level: Active  Behavioral Response: Well GroomedLethargicAnxious, Depressed and Irritable  Type of Therapy: Individual Therapy  Treatment Goals addressed: Coping  Interventions: Motivational Interviewing, Strength-based and Supportive  Summary: [name] is a 62 y.o. female who presents with depressed mood and flat affect. She is not feeling well and reports that her blood sugars have been running very high. She admits poor self care, occasionally not taking her medication, not eating well and not checking her blood sugar. She reports very poor motivation to make the lifestyle changes she needs to. She endorses feeling too tired emotionally to sustain the effort and discipline required to make these changes. Her sleep is increased and her appetite is inconsistent.    Suicidal/Homicidal: Nowithout intent/plan  Therapist Response: Assessed patients current functioning and reviewed progress. Reviewed coping strategies. Assessed patients safety and assisted in identifying protective factors.  Reviewed crisis plan with patient. Assisted patient with the expression of frustration and hopelessness. Reviewed patients self care plan. Assessed progress related to self care. Patients self care is poor. Recommend daily exercise, increased socialization and recreation. Used motivational interviewing to assist and encourage patient through the change process. Explored patients barriers to change.   Plan: Return again in two weeks.  Diagnosis: Axis I: Major Depression, Recurrent severe    Axis II: No diagnosis    [name] Kalmar, LCSW 09/13/2013

## 2013-09-14 ENCOUNTER — Telehealth: Payer: Self-pay | Admitting: Internal Medicine

## 2013-09-14 DIAGNOSIS — E875 Hyperkalemia: Secondary | ICD-10-CM

## 2013-09-14 NOTE — Telephone Encounter (Signed)
I attempted to call [name] on 09/14/13 at 10:20a regarding her most recent BMET (K 5.6, Cr 2.12, of note AG only 13). She did not answer. I left a message for her to stop her Cozaar, drink plenty of water, and stay on top of her insulin this weekend.  She should return on Monday for a repeat BMET.    I will forward this to the front desk pool so they may also try to call her since I couldn't speak with her.

## 2013-09-17 ENCOUNTER — Encounter (HOSPITAL_COMMUNITY): Payer: Self-pay | Admitting: Emergency Medicine

## 2013-09-17 ENCOUNTER — Observation Stay (HOSPITAL_COMMUNITY)
Admission: EM | Admit: 2013-09-17 | Discharge: 2013-09-18 | Disposition: A | Discharge status: Home / Self Care | Payer: PRIVATE HEALTH INSURANCE | Attending: Internal Medicine | Admitting: Internal Medicine

## 2013-09-17 ENCOUNTER — Other Ambulatory Visit (INDEPENDENT_AMBULATORY_CARE_PROVIDER_SITE_OTHER): Payer: PRIVATE HEALTH INSURANCE

## 2013-09-17 ENCOUNTER — Telehealth: Payer: Self-pay | Admitting: *Deleted

## 2013-09-17 DIAGNOSIS — Z9119 Patient's noncompliance with other medical treatment and regimen: Secondary | ICD-10-CM | POA: Insufficient documentation

## 2013-09-17 DIAGNOSIS — K219 Gastro-esophageal reflux disease without esophagitis: Secondary | ICD-10-CM | POA: Insufficient documentation

## 2013-09-17 DIAGNOSIS — I10 Essential (primary) hypertension: Secondary | ICD-10-CM | POA: Diagnosis present

## 2013-09-17 DIAGNOSIS — R1013 Epigastric pain: Secondary | ICD-10-CM | POA: Diagnosis present

## 2013-09-17 DIAGNOSIS — E1165 Type 2 diabetes mellitus with hyperglycemia: Secondary | ICD-10-CM | POA: Insufficient documentation

## 2013-09-17 DIAGNOSIS — N179 Acute kidney failure, unspecified: Secondary | ICD-10-CM | POA: Diagnosis present

## 2013-09-17 DIAGNOSIS — I2510 Atherosclerotic heart disease of native coronary artery without angina pectoris: Secondary | ICD-10-CM | POA: Diagnosis present

## 2013-09-17 DIAGNOSIS — Z91199 Patient's noncompliance with other medical treatment and regimen due to unspecified reason: Secondary | ICD-10-CM | POA: Insufficient documentation

## 2013-09-17 DIAGNOSIS — I129 Hypertensive chronic kidney disease with stage 1 through stage 4 chronic kidney disease, or unspecified chronic kidney disease: Secondary | ICD-10-CM | POA: Insufficient documentation

## 2013-09-17 DIAGNOSIS — Z794 Long term (current) use of insulin: Secondary | ICD-10-CM | POA: Insufficient documentation

## 2013-09-17 DIAGNOSIS — I5030 Unspecified diastolic (congestive) heart failure: Secondary | ICD-10-CM | POA: Diagnosis present

## 2013-09-17 DIAGNOSIS — N289 Disorder of kidney and ureter, unspecified: Secondary | ICD-10-CM | POA: Insufficient documentation

## 2013-09-17 DIAGNOSIS — IMO0002 Reserved for concepts with insufficient information to code with codable children: Principal | ICD-10-CM | POA: Diagnosis present

## 2013-09-17 DIAGNOSIS — E875 Hyperkalemia: Secondary | ICD-10-CM

## 2013-09-17 DIAGNOSIS — E785 Hyperlipidemia, unspecified: Secondary | ICD-10-CM | POA: Insufficient documentation

## 2013-09-17 DIAGNOSIS — D649 Anemia, unspecified: Secondary | ICD-10-CM | POA: Insufficient documentation

## 2013-09-17 DIAGNOSIS — R739 Hyperglycemia, unspecified: Secondary | ICD-10-CM

## 2013-09-17 DIAGNOSIS — N183 Acute kidney failure, unspecified: Secondary | ICD-10-CM | POA: Diagnosis present

## 2013-09-17 LAB — URINALYSIS, ROUTINE W REFLEX MICROSCOPIC
Glucose, UA: >1000 mg/dL — AB
Ketones, ur: NEGATIVE mg/dL
Leukocytes, UA: NEGATIVE
Nitrite: NEGATIVE
Protein, ur: NEGATIVE mg/dL
Urobilinogen, UA: 0.2 mg/dL (ref 0.0–1.0)

## 2013-09-17 LAB — CBC
HCT: 34.5 % — ABNORMAL LOW (ref 36.0–46.0)
Hemoglobin: 11.7 g/dL — ABNORMAL LOW (ref 12.0–15.0)
Hemoglobin: 10.7 g/dL — ABNORMAL LOW (ref 12.0–15.0)
MCH: 25.4 pg — ABNORMAL LOW (ref 26.0–34.0)
MCHC: 33.9 g/dL (ref 30.0–36.0)
MCHC: 33 g/dL (ref 30.0–36.0)
Platelets: 240 10*3/uL (ref 150–400)
RBC: 4.22 MIL/uL (ref 3.87–5.11)
RDW: 14.9 % (ref 11.5–15.5)
WBC: 9.5 10*3/uL (ref 4.0–10.5)
WBC: 9.8 10*3/uL (ref 4.0–10.5)

## 2013-09-17 LAB — COMPREHENSIVE METABOLIC PANEL
AST: 44 U/L — ABNORMAL HIGH (ref 0–37)
Albumin: 3.3 g/dL — ABNORMAL LOW (ref 3.5–5.2)
Calcium: 9 mg/dL (ref 8.4–10.5)
Creatinine, Ser: 1.86 mg/dL — ABNORMAL HIGH (ref 0.50–1.10)
Total Protein: 7.9 g/dL (ref 6.0–8.3)

## 2013-09-17 LAB — POCT I-STAT, CHEM 8
Calcium, Ion: 1.16 mmol/L (ref 1.13–1.30)
HCT: 36 % (ref 36.0–46.0)
Hemoglobin: 12.2 g/dL (ref 12.0–15.0)
TCO2: 22 mmol/L (ref 0–100)

## 2013-09-17 LAB — BASIC METABOLIC PANEL
BUN: 36 mg/dL — ABNORMAL HIGH (ref 6–23)
CO2: 23 mEq/L (ref 19–32)
Chloride: 89 mEq/L — ABNORMAL LOW (ref 96–112)
Chloride: 88 mEq/L — ABNORMAL LOW (ref 96–112)
Creat: 2.14 mg/dL — ABNORMAL HIGH (ref 0.50–1.10)
GFR calc Af Amer: 29 mL/min — ABNORMAL LOW (ref >90)
GFR calc non Af Amer: 25 mL/min — ABNORMAL LOW (ref >90)
Glucose, Bld: 646 mg/dL (ref 70–99)
Potassium: 6.6 mEq/L (ref 3.5–5.3)
Potassium: 5.2 mEq/L — ABNORMAL HIGH (ref 3.5–5.1)
Sodium: 125 mEq/L — ABNORMAL LOW (ref 135–145)

## 2013-09-17 LAB — POCT I-STAT 3, VENOUS BLOOD GAS (G3P V)
Bicarbonate: 26.9 mEq/L — ABNORMAL HIGH (ref 20.0–24.0)
pH, Ven: 7.324 — ABNORMAL HIGH (ref 7.250–7.300)
pO2, Ven: 32 mmHg (ref 30.0–45.0)

## 2013-09-17 LAB — LIPASE, BLOOD: Lipase: 75 U/L — ABNORMAL HIGH (ref 11–59)

## 2013-09-17 LAB — GLUCOSE, CAPILLARY
Glucose-Capillary: >600 mg/dL (ref 70–99)
Glucose-Capillary: 381 mg/dL — ABNORMAL HIGH (ref 70–99)
Glucose-Capillary: 347 mg/dL — ABNORMAL HIGH (ref 70–99)

## 2013-09-17 LAB — URINE MICROSCOPIC-ADD ON

## 2013-09-17 MED ORDER — FERROUS SULFATE 325 (65 FE) MG PO TABS
325.0000 mg | ORAL_TABLET | Freq: Three times a day (TID) | ORAL | Status: DC
  Administered 2013-09-18 (×2): 325 mg via ORAL
  Filled 2013-09-17 (×4): qty 1

## 2013-09-17 MED ORDER — INSULIN ASPART 100 UNIT/ML ~~LOC~~ SOLN
0.0000 [IU] | Freq: Three times a day (TID) | SUBCUTANEOUS | Status: DC
  Administered 2013-09-18: 3 [IU] via SUBCUTANEOUS
  Administered 2013-09-18: 13:00:00 8 [IU] via SUBCUTANEOUS

## 2013-09-17 MED ORDER — ISOSORBIDE MONONITRATE ER 60 MG PO TB24
60.0000 mg | ORAL_TABLET | Freq: Every day | ORAL | Status: DC
  Administered 2013-09-18: 60 mg via ORAL
  Filled 2013-09-17: qty 1

## 2013-09-17 MED ORDER — SENNA 8.6 MG PO TABS
4.0000 | ORAL_TABLET | Freq: Every day | ORAL | Status: DC | PRN
  Filled 2013-09-17: qty 4

## 2013-09-17 MED ORDER — INSULIN ASPART 100 UNIT/ML ~~LOC~~ SOLN
10.0000 [IU] | Freq: Once | SUBCUTANEOUS | Status: AC
  Administered 2013-09-17: 10 [IU] via SUBCUTANEOUS
  Filled 2013-09-17: qty 1

## 2013-09-17 MED ORDER — ACETAMINOPHEN 650 MG RE SUPP: 650.0000 mg | Freq: Four times a day (QID) | RECTAL | Status: DC | PRN

## 2013-09-17 MED ORDER — HEPARIN SODIUM (PORCINE) 5000 UNIT/ML IJ SOLN
5000.0000 [IU] | Freq: Three times a day (TID) | INTRAMUSCULAR | Status: DC
  Administered 2013-09-18 (×2): 5000 [IU] via SUBCUTANEOUS
  Filled 2013-09-17 (×5): qty 1

## 2013-09-17 MED ORDER — SODIUM CHLORIDE 0.9 % IV SOLN
INTRAVENOUS | Status: DC
  Filled 2013-09-17: qty 1

## 2013-09-17 MED ORDER — FLUOXETINE HCL 20 MG PO CAPS
20.0000 mg | ORAL_CAPSULE | Freq: Every morning | ORAL | Status: DC
  Administered 2013-09-18: 10:00:00 20 mg via ORAL
  Filled 2013-09-17: qty 1

## 2013-09-17 MED ORDER — ASPIRIN 81 MG PO CHEW
81.0000 mg | CHEWABLE_TABLET | Freq: Every day | ORAL | Status: DC
  Administered 2013-09-18: 81 mg via ORAL
  Filled 2013-09-17: qty 1

## 2013-09-17 MED ORDER — AMITRIPTYLINE HCL 25 MG PO TABS
25.0000 mg | ORAL_TABLET | Freq: Every day | ORAL | Status: DC
  Administered 2013-09-18: 25 mg via ORAL
  Filled 2013-09-17 (×2): qty 1

## 2013-09-17 MED ORDER — SODIUM CHLORIDE 0.9 % IV SOLN: INTRAVENOUS | Status: AC

## 2013-09-17 MED ORDER — GABAPENTIN 400 MG PO CAPS
1600.0000 mg | ORAL_CAPSULE | Freq: Every day | ORAL | Status: DC
  Administered 2013-09-18: 1600 mg via ORAL
  Filled 2013-09-17 (×2): qty 4

## 2013-09-17 MED ORDER — LACTATED RINGERS IV BOLUS (SEPSIS): 15.0000 mL/kg | Freq: Once | INTRAVENOUS | Status: DC

## 2013-09-17 MED ORDER — SODIUM CHLORIDE 0.9 % IV BOLUS (SEPSIS)
1000.0000 mL | Freq: Once | INTRAVENOUS | Status: AC
  Administered 2013-09-17: 1000 mL via INTRAVENOUS

## 2013-09-17 MED ORDER — SODIUM CHLORIDE 0.9 % IV SOLN
1000.0000 mL | INTRAVENOUS | Status: DC
  Administered 2013-09-18 (×2): 1000 mL via INTRAVENOUS

## 2013-09-17 MED ORDER — BUPROPION HCL ER (XL) 300 MG PO TB24
300.0000 mg | ORAL_TABLET | Freq: Every day | ORAL | Status: DC
  Administered 2013-09-18: 10:00:00 300 mg via ORAL
  Filled 2013-09-17: qty 1

## 2013-09-17 MED ORDER — GABAPENTIN 400 MG PO CAPS
800.0000 mg | ORAL_CAPSULE | Freq: Every day | ORAL | Status: DC
  Administered 2013-09-18: 10:00:00 800 mg via ORAL
  Filled 2013-09-17: qty 2

## 2013-09-17 MED ORDER — GABAPENTIN 800 MG PO TABS: 800.0000 mg | ORAL_TABLET | Freq: Two times a day (BID) | ORAL | Status: DC

## 2013-09-17 MED ORDER — ACETAMINOPHEN 325 MG PO TABS: 650.0000 mg | ORAL_TABLET | Freq: Four times a day (QID) | ORAL | Status: DC | PRN

## 2013-09-17 MED ORDER — SODIUM CHLORIDE 0.9 % IJ SOLN
3.0000 mL | Freq: Two times a day (BID) | INTRAMUSCULAR | Status: DC
  Administered 2013-09-18: 3 mL via INTRAVENOUS

## 2013-09-17 MED ORDER — PANTOPRAZOLE SODIUM 40 MG PO TBEC
40.0000 mg | DELAYED_RELEASE_TABLET | Freq: Every day | ORAL | Status: DC
  Administered 2013-09-18: 10:00:00 40 mg via ORAL
  Filled 2013-09-17: qty 1

## 2013-09-17 MED ORDER — FUROSEMIDE 40 MG PO TABS
40.0000 mg | ORAL_TABLET | Freq: Every day | ORAL | Status: DC
  Administered 2013-09-18: 10:00:00 40 mg via ORAL
  Filled 2013-09-17: qty 1

## 2013-09-17 MED ORDER — INSULIN GLARGINE 100 UNIT/ML ~~LOC~~ SOLN
30.0000 [IU] | Freq: Every day | SUBCUTANEOUS | Status: DC
  Administered 2013-09-18: 30 [IU] via SUBCUTANEOUS
  Filled 2013-09-17 (×3): qty 0.3

## 2013-09-17 MED ORDER — INSULIN GLARGINE 100 UNIT/ML ~~LOC~~ SOLN: 30.0000 [IU] | Freq: Every day | SUBCUTANEOUS | Status: DC

## 2013-09-17 MED ORDER — METOPROLOL SUCCINATE ER 25 MG PO TB24
25.0000 mg | ORAL_TABLET | Freq: Every day | ORAL | Status: DC
  Administered 2013-09-18: 25 mg via ORAL
  Filled 2013-09-17: qty 1

## 2013-09-17 MED ORDER — DICLOFENAC SODIUM 1 % TD GEL
2.0000 g | Freq: Three times a day (TID) | TRANSDERMAL | Status: DC | PRN
  Filled 2013-09-17: qty 100

## 2013-09-17 MED ORDER — PROMETHAZINE HCL 25 MG PO TABS: 25.0000 mg | ORAL_TABLET | Freq: Four times a day (QID) | ORAL | Status: DC | PRN

## 2013-09-17 NOTE — ED Notes (Signed)
Glucose 632 called from lab

## 2013-09-17 NOTE — H&P (Signed)
Date: 09/17/2013               Patient Name:  [name] MRN: [id number]  DOB: 27-Feb-1951 Age / Sex: 62 y.o., female   PCP: Belia Heman, MD         Medical Service: Internal Medicine Teaching Service         Attending Physician: Dr. Jonah Blue, DO    First Contact: Dr. Aundria Rud Pager: [phone]  Second Contact: Dr. Shirlee Latch Pager: [phone]       After Hours (After 5p/  First Contact Pager: [phone]  weekends / holidays): Second Contact Pager: [phone]   Chief Complaint: hyperglycemia  History of Present Illness: [name] is a 62 year old African American female with PMH of uncontrolled DM2, HTN, HL, diastolic CHF, CKD (baseline Cr 1.5-1.7), and GERD presenting to the ED today for hyperglycemia. She was last seen in [facility] by PCP [name] on 09/12/13. Lab work the next day on 11/20 revealed K 5.6, Cr 2.12 with glucose of 353 when she was instructed to stop Cozaar and Mobic and continue insulin. She was subsequently asked to return today for lab work which revealed glucose of 646 with K 6.6 and Na 125 with Cr 2.14 but sample was hemolyzed. She was called and asked to return to the ED.  Repeat lab work in ED showed glucose of 632 and K of 5.   [name] admits to missing mealtime insulin doses.  She reports since her visit with [name] she has not gotten a new meter, but called and asked company to send her batteries for meter. She has been giving herself lantus 55 units nightly, and did take it last night.  Keeps forgetting to take daytime insulin, last time took it approximately 1-2 weeks ago.   But has not taken regularly for the last month. Today she complains of weakness and fatigue, epigastric abdominal pain, nausea without vomiting, polyuria and polydipsia.  She denies SOB, fever, chills, cough, dysuria. She admits occasional substernal chest pain last episode maybe days to weeks ago.   When pain comes it last for 10-15 minutes is associated with exertional activities and is relieved  by rest.  Last cardiac cath was in 2011 with 60-70% RCA stenosis.  Meds: Current Facility-Administered Medications  Medication Dose Route Frequency Provider Last Rate Last Dose  . 0.9 %  sodium chloride infusion  1,000 mL Intravenous Continuous Juliet Rude. Pickering, MD      . insulin regular (NOVOLIN R,HUMULIN R) 1 Units/mL in sodium chloride 0.9 % 100 mL infusion   Intravenous Continuous Nathan R. Pickering, MD      . sodium chloride 0.9 % bolus 1,000 mL  1,000 mL Intravenous Once Harrold Donath R. Pickering, MD 1,000 mL/hr at 09/17/13 1948 1,000 mL at 09/17/13 1948   Current Outpatient Prescriptions  Medication Sig Dispense Refill  . amitriptyline (ELAVIL) 25 MG tablet Take 1 tablet (25 mg total) by mouth at bedtime.  30 tablet  1  . aspirin 81 MG chewable tablet Chew 81 mg by mouth daily.      Marland Kitchen buPROPion (WELLBUTRIN XL) 300 MG 24 hr tablet Take 1 tablet (300 mg total) by mouth daily.  30 tablet  5  . dexlansoprazole (DEXILANT) 60 MG capsule Take 60 mg by mouth daily.        . diclofenac sodium (VOLTAREN) 1 % GEL Apply 2 g topically 3 (three) times daily as needed (pain relief).      Tery Sanfilippo  Sodium (COLACE PO) Take 2 tablets by mouth daily as needed (constipation).      . ferrous sulfate 325 (65 FE) MG tablet Take 325 mg by mouth 3 (three) times daily as needed (for iron supplement).      Marland Kitchen FLUoxetine (PROZAC) 20 MG capsule Take 1 capsule (20 mg total) by mouth every morning.  30 capsule  5  . furosemide (LASIX) 40 MG tablet Take 1 tablet (40 mg total) by mouth daily.  30 tablet  11  . gabapentin (NEURONTIN) 800 MG tablet Take 800-1,600 mg by mouth 2 (two) times daily. 800 mg in the am and 1600 mg in the evening.      . insulin aspart (NOVOLOG FLEXPEN) 100 UNIT/ML SOPN FlexPen Inject 10 Units into the skin 3 (three) times daily with meals.  5 pen  12  . insulin glargine (LANTUS) 100 UNIT/ML injection Inject 0.55 mLs (55 Units total) into the skin at bedtime.  3 pen  12  . isosorbide mononitrate  (IMDUR) 60 MG 24 hr tablet Take 1 tablet (60 mg total) by mouth daily.  30 tablet  6  . metoprolol succinate (TOPROL-XL) 25 MG 24 hr tablet Take 1 tablet (25 mg total) by mouth daily.  30 tablet  11  . promethazine (PHENERGAN) 25 MG tablet Take 1 tablet (25 mg total) by mouth every 6 (six) hours as needed for nausea.  20 tablet  1  . senna (SENOKOT) 8.6 MG TABS Take 4 tablets by mouth daily as needed (constipation).      . traMADol (ULTRAM) 50 MG tablet Take 1 tablet (50 mg total) by mouth every 8 (eight) hours as needed for pain.  90 tablet  0  . ACCU-CHEK FASTCLIX LANCETS MISC 1 each by Does not apply route 3 (three) times daily before meals. Dx code 250.00  102 each  12  . glucose blood (ACCU-CHEK SMARTVIEW) test strip Use to check blood sugar before meals dx code 250.00  100 each  12  . glucose monitoring kit (FREESTYLE) monitoring kit 1 each by Does not apply route as needed for other.  1 each  0  . Insulin Pen Needle 31G X 5 MM MISC Box of 100 - uses up to 5 times per day according to CBG  100 each  11   Allergies: Allergies as of 09/17/2013 - Review Complete 09/17/2013  Allergen Reaction Noted  . Indomethacin Diarrhea 03/11/2011  . Penicillins Hives   . Cimetidine    . Sulfonamide derivatives     Past Medical History  Diagnosis Date  . Hypertension   . Peripheral neuropathy   . Cervicalgia   . Low back pain syndrome   . Hyperlipidemia   . Vitamin D deficiency   . Vitamin B12 deficiency   . Iliotibial band syndrome   . Anemia   . Borderline personality disorder   . Nonorganic psychosis   . GERD (gastroesophageal reflux disease)   . Diabetes mellitus   . Depression   . Diabetic gastroparesis   . Sleep apnea   . CAD (coronary artery disease)     Catherization 11/18/09>nonobstructive CAD , normal LV function, EF 65%  . Health maintenance examination     Colonoscopy 2009> normal; Diabetic eye exam 12/2008. No diabetic retinopathy; Mammogram 11/10> No evidence of malignancy,  DXA 03/14/13 : normal  . Headache(784.0)   . Arthritis   . SBO (small bowel obstruction) 01/09/2013  . Diastolic CHF     Grade I, on Echo 06/2013,  EF 55-60%   Past Surgical History  Procedure Laterality Date  . Abdominal hysterectomy    . Hand surgery    . Bowel resection N/A 01/10/2013    Procedure: SMALL BOWEL RESECTION;  Surgeon: Lodema Pilot, DO;  Location: [facility] OR;  Service: General;  Laterality: N/A;  . Lysis of adhesion N/A 01/10/2013    Procedure: LYSIS OF ADHESION;  Surgeon: Lodema Pilot, DO;  Location: [facility] OR;  Service: General;  Laterality: N/A;  . Laparotomy N/A 01/10/2013    Procedure: EXPLORATORY LAPAROTOMY;  Surgeon: Lodema Pilot, DO;  Location: [facility] OR;  Service: General;  Laterality: N/A;   Family History  Problem Relation Age of Onset  . Diabetes Mother   . Hypertension Mother   . Hyperlipidemia Mother   . Arthritis Mother   . Depression Mother   . Heart disease Father   . Diabetes Sister   . Diabetes Brother   . Heart disease Brother   . Heart disease Paternal Grandmother   . Seizures Brother    History   Social History  . Marital Status: Married    Spouse Name: N/A    Number of Children: N/A  . Years of Education: N/A   Occupational History  . Not on file.   Social History Main Topics  . Smoking status: Former Smoker    Types: Cigarettes    Quit date: 10/25/2002  . Smokeless tobacco: Not on file  . Alcohol Use: No  . Drug Use: No  . Sexual Activity: Yes   Other Topics Concern  . Not on file   Social History Narrative  . No narrative on file    Review of Systems: Review of Systems  Constitutional: Positive for malaise/fatigue. Negative for fever and chills.       "woozy"  HENT: Negative for congestion.   Eyes: Positive for blurred vision (feels it is worse over the last few weeks). Negative for photophobia.  Respiratory: Negative for cough, sputum production and shortness of breath.   Cardiovascular: Negative for chest pain (no current, last  episode days to weeks ago), palpitations, orthopnea and leg swelling.  Gastrointestinal: Positive for nausea, abdominal pain (x1 month) and constipation. Negative for heartburn, vomiting, diarrhea, blood in stool and melena.  Genitourinary: Positive for frequency. Negative for dysuria and urgency.  Musculoskeletal: Negative for back pain and myalgias.       +cramps  Neurological: Positive for weakness (generlized) and headaches (none currently, reports occasional HA). Negative for dizziness, tingling, tremors, sensory change and speech change.  Endo/Heme/Allergies: Positive for polydipsia.       Thirsty     Physical Exam: Blood pressure 125/60, pulse 80, temperature 98.7 F (37.1 C), temperature source Oral, resp. rate 16, weight 200 lb 7 oz (90.918 kg), SpO2 96.00%. on RA Physical Exam  Nursing note and vitals reviewed. Constitutional: She is oriented to person, place, and time and well-developed, well-nourished, and in no distress. No distress.  HENT:  Head: Normocephalic and atraumatic.  Mouth/Throat: Mucous membranes are not dry.  Eyes: EOM are normal. Pupils are equal, round, and reactive to light.  Cardiovascular: Normal rate, regular rhythm, normal heart sounds and intact distal pulses.   No murmur [name]. Pulmonary/Chest: Effort normal and breath sounds normal. No respiratory distress. She has no wheezes. She has no rales. She exhibits no tenderness.  Abdominal: Soft. Bowel sounds are normal. She exhibits no distension. There is tenderness (mild epigastric). There is no rebound.  Musculoskeletal: She exhibits no edema.  Neurological: She is  alert and oriented to person, place, and time. No cranial nerve deficit. Gait normal.  Skin: Skin is warm and dry. She is not diaphoretic.  Psychiatric: Affect normal.     Lab results: Basic Metabolic Panel:  Recent Labs  16/10/96 1339 09/17/13 1647 09/17/13 1715  NA 125* 126* 128*  K 6.6* 5.2* 5.0  CL 89* 88* 94*  CO2 22 23  --     GLUCOSE 646* 600* 632*  BUN 36* 36* 40*  CREATININE 2.14* 2.04* 2.40*  CALCIUM 9.5 9.8  --                    AG  14           15            CBC:  Recent Labs  09/17/13 1647 09/17/13 1715  WBC 9.5  --   HGB 11.7* 12.2  HCT 34.5* 36.0  MCV 77.0*  --   PLT 232  --    CBG:  Recent Labs  09/17/13 1709  GLUCAP >600*   Urinalysis:  Recent Labs  09/17/13 1942  COLORURINE YELLOW  LABSPEC 1.021  PHURINE 5.5  GLUCOSEU >1000*  HGBUR NEGATIVE  BILIRUBINUR NEGATIVE  KETONESUR NEGATIVE  PROTEINUR NEGATIVE  UROBILINOGEN 0.2  NITRITE NEGATIVE  LEUKOCYTESUR NEGATIVE     Other results: EKG: NSR, normal axis, no ST or T wave changes, unchanged from previous EKG  Assessment & Plan by Problem: Ms. Norell is a 62 year old female with PMH of IDDM, CKD, diastolic chf, HTN, and CAD that was admitted for hyperglycemia.    Hyperglycemia in type 2 diabetes mellitus Likely secondary to noncompliance. - Patient has recently lost control of her blood sugars with A1C increasing from 7.2 on 05/23/13 to 13.4 on 09/12/13.  Glucose noted to be >600 on admission. AG of 15 on admission in the setting of hyperglycemia however DKA seems less likely given no ketones in urine, no vomiting, no diarrhea.  CGB improved to 381 after fluids, will try to control with [place] insulin for now. - Admit to Telemetry (observation) - Novolog 10u now - Lantus 25u QHS - Patient received 1L of NS in ED will continue to 150cc/ hr. - Will likely benefit from CDE visit inpatient for assistance with meter and further diabetes education. - Frequent monitoring of BMP (smaples have been hemolyzed) - Monitor I&O  CKD (chronic kidney disease) stage 3, GFR 30-59 ml/min -Baseline Cr 1.5-1.7, elevated on admission to 1.86-2.4. - Likely prerenal etiology due to dehydration from hyperglycemia.  Will volume resuscitate with IVF cautiously.  I do not feel this truly represents acute kidney injury at this time. -NS at 150cc/hr     CAD 11/18/09 Cath:circumflex 50%, RCA 60-70%, medical tx, normal LVF 02/03/10 Nuclear stress test:Normal. -Patient denies any current chest pain but does report she does have occasional episodes of chest pain that last 10-15 minutes are associated with exertion and relieved by rest.  She is followed by Dr. Walker Shadow cardiology.  Of note she was admitted for atypical chest pain in Jan of 2014 and was scheduled for a outpatient lexiscan, she denies ever going for this study. - Will obtain CE x2 to rule out ACS although patient has no current chest pain. - Will need follow up with cardiology as outpatient. -ASA 81mg     Abdominal pain, epigastric - Chronic,follows with Dr. Matthias Hughs, had a normal gastric emptying study.  Abdominal CT 08/08/13 showed abdominal wall inflammation completed 1  month trial of mobic, -  Will obtain CMP and Lipase to r/o pancreatitis, and evaluate for gb dysfunction. - Continue to monitor, workup will likely need to continue as outpatient. -Protonix 40mg  daily    HYPERTENSION -Metoprolol 25mg  -IMDUR 60mg  -Lasix 40mg  QD    Major depressive disorder - Continue Wellbutrin - Continue Prozac    Diastolic CHF -Last echo 07/06/13 showed EF 55-60% with grade 1 diastolic dysfunction. - Hydrate with IVF cautiously.   HYPERLIPIDEMIA -Consider starting statin  Diet: Carb mod Code Status: Full DVT PPx: Heparin [place] Dispo: Disposition is deferred at this time, awaiting improvement of current medical problems. Anticipated discharge in approximately 1 day(s).   The patient does have a current PCP (Neema Davina Poke, MD) and does need an [facility] hospital follow-up appointment after discharge.  The patient does not have transportation limitations that hinder transportation to clinic appointments.  Signed: Carlynn Purl, DO 09/17/2013, 8:41 PM

## 2013-09-17 NOTE — ED Notes (Signed)
Pt. Stated, i went to Dr. Today to get lab work done and they called me and told me to come here because my labs were not right. My K+ and Na were not normal.  I've been wobbly and my stomach has been hurting.

## 2013-09-17 NOTE — ED Provider Notes (Signed)
CSN: [id number]     Arrival date & time 09/17/13  1635 History   First MD Initiated Contact with Patient 09/17/13 1900     Chief Complaint  Patient presents with  . Dizziness  . Abdominal Pain   (Consider location/radiation/quality/duration/timing/severity/associated sxs/prior Treatment) Patient is a 62 y.o. female presenting with abdominal pain. The history is provided by the patient.  Abdominal Pain Associated symptoms: fatigue   Associated symptoms: no chest pain, no diarrhea, no nausea, no shortness of breath and no vomiting    patient was sent in by outpatient clinic for hyperglycemia and lab abnormalities. Reportedly has been urinating frequently and had blood work done at office today. Was told to come in for hyperglycemia and high potassium. Patient states she's been urinating frequently and feels like dehydrated. No chest. No trouble breathing. No fevers. She states she feels fatigued. She states that she's been taking her insulin at night but her monitor is broken so she was not able to take it during the day.  Past Medical History  Diagnosis Date  . Hypertension   . Peripheral neuropathy   . Cervicalgia   . Low back pain syndrome   . Hyperlipidemia   . Vitamin D deficiency   . Vitamin B12 deficiency   . Iliotibial band syndrome   . Anemia   . Borderline personality disorder   . Nonorganic psychosis   . GERD (gastroesophageal reflux disease)   . Diabetes mellitus   . Depression   . Diabetic gastroparesis   . Sleep apnea   . CAD (coronary artery disease)     Catherization 11/18/09>nonobstructive CAD , normal LV function, EF 65%  . Health maintenance examination     Colonoscopy 2009> normal; Diabetic eye exam 12/2008. No diabetic retinopathy; Mammogram 11/10> No evidence of malignancy, DXA 03/14/13 : normal  . Headache(784.0)   . Arthritis   . SBO (small bowel obstruction) 01/09/2013  . Diastolic CHF     Grade I, on Echo 06/2013, EF 55-60%   Past Surgical History   Procedure Laterality Date  . Abdominal hysterectomy    . Hand surgery    . Bowel resection N/A 01/10/2013    Procedure: SMALL BOWEL RESECTION;  Surgeon: Lodema Pilot, DO;  Location: [facility] OR;  Service: General;  Laterality: N/A;  . Lysis of adhesion N/A 01/10/2013    Procedure: LYSIS OF ADHESION;  Surgeon: Lodema Pilot, DO;  Location: [facility] OR;  Service: General;  Laterality: N/A;  . Laparotomy N/A 01/10/2013    Procedure: EXPLORATORY LAPAROTOMY;  Surgeon: Lodema Pilot, DO;  Location: [facility] OR;  Service: General;  Laterality: N/A;   Family History  Problem Relation Age of Onset  . Diabetes Mother   . Hypertension Mother   . Hyperlipidemia Mother   . Arthritis Mother   . Depression Mother   . Heart disease Father   . Diabetes Sister   . Diabetes Brother   . Heart disease Brother   . Heart disease Paternal Grandmother   . Seizures Brother    History  Substance Use Topics  . Smoking status: Former Smoker    Types: Cigarettes    Quit date: 10/25/2002  . Smokeless tobacco: Not on file  . Alcohol Use: No   OB History   Grav Para Term Preterm Abortions TAB SAB Ect Mult Living                 Review of Systems  Constitutional: Positive for fatigue. Negative for activity change and appetite change.  Eyes: Negative for pain.  Respiratory: Negative for chest tightness and shortness of breath.   Cardiovascular: Negative for chest pain and leg swelling.  Gastrointestinal: Positive for abdominal pain. Negative for nausea, vomiting and diarrhea.  Endocrine: Positive for polyuria.  Genitourinary: Negative for flank pain.  Musculoskeletal: Negative for back pain and neck stiffness.  Skin: Negative for rash.  Neurological: Negative for weakness, numbness and headaches.  Psychiatric/Behavioral: Negative for behavioral problems.    Allergies  Indomethacin; Penicillins; Cimetidine; and Sulfonamide derivatives  Home Medications  No current outpatient prescriptions on file. BP 134/79  Pulse  80  Temp(Src) 97.7 F (36.5 C) (Oral)  Resp 15  Ht 5\' 5"  (1.651 m)  Wt 202 lb 2.6 oz (91.7 kg)  BMI 33.64 kg/m2  SpO2 97% Physical Exam  Nursing note and vitals reviewed. Constitutional: She is oriented to person, place, and time. She appears well-developed.  HENT:  Head: Normocephalic and atraumatic.  Eyes: EOM are normal. Pupils are equal, round, and reactive to light.  Neck: Normal range of motion. Neck supple.  Cardiovascular: Normal rate, regular rhythm and normal heart sounds.   No murmur heard. Pulmonary/Chest: Effort normal and breath sounds normal. No respiratory distress. She has no wheezes. She has no rales.  Abdominal: Soft. Bowel sounds are normal. She exhibits no distension. There is no tenderness. There is no rebound and no guarding.  Musculoskeletal: Normal range of motion.  Neurological: She is alert and oriented to person, place, and time. No cranial nerve deficit.  Skin: Skin is warm and dry.  Psychiatric: She has a normal mood and affect. Her speech is normal.    ED Course  Procedures (including critical care time) Labs Review Labs Reviewed  BASIC METABOLIC PANEL - Abnormal; Notable for the following:    Sodium 126 (*)    Potassium 5.2 (*)    Chloride 88 (*)    Glucose, Bld 600 (*)    BUN 36 (*)    Creatinine, Ser 2.04 (*)    GFR calc non Af Amer 25 (*)    GFR calc Af Amer 29 (*)    All other components within normal limits  CBC - Abnormal; Notable for the following:    Hemoglobin 11.7 (*)    HCT 34.5 (*)    MCV 77.0 (*)    All other components within normal limits  URINALYSIS, ROUTINE W REFLEX MICROSCOPIC - Abnormal; Notable for the following:    Glucose, UA >1000 (*)    All other components within normal limits  GLUCOSE, CAPILLARY - Abnormal; Notable for the following:    Glucose-Capillary >600 (*)    All other components within normal limits  COMPREHENSIVE METABOLIC PANEL - Abnormal; Notable for the following:    Sodium 127 (*)    Potassium  5.7 (*)    Chloride 92 (*)    Glucose, Bld 434 (*)    BUN 36 (*)    Creatinine, Ser 1.86 (*)    Albumin 3.3 (*)    AST 44 (*)    Total Bilirubin 0.1 (*)    GFR calc non Af Amer 28 (*)    GFR calc Af Amer 32 (*)    All other components within normal limits  GLUCOSE, CAPILLARY - Abnormal; Notable for the following:    Glucose-Capillary 381 (*)    All other components within normal limits  LIPASE, BLOOD - Abnormal; Notable for the following:    Lipase 75 (*)    All other components within normal limits  GLUCOSE, CAPILLARY - Abnormal; Notable for the following:    Glucose-Capillary 347 (*)    All other components within normal limits  POCT I-STAT, CHEM 8 - Abnormal; Notable for the following:    Sodium 128 (*)    Chloride 94 (*)    BUN 40 (*)    Creatinine, Ser 2.40 (*)    Glucose, Bld 632 (*)    All other components within normal limits  POCT I-STAT 3, BLOOD GAS (G3P V) - Abnormal; Notable for the following:    pH, Ven 7.324 (*)    pCO2, Ven 51.6 (*)    Bicarbonate 26.9 (*)    All other components within normal limits  URINE MICROSCOPIC-ADD ON  TROPONIN I  BASIC METABOLIC PANEL  CBC  CREATININE, SERUM  BASIC METABOLIC PANEL  CBC   Imaging Review No results found.  EKG Interpretation    Date/Time:    Ventricular Rate:    PR Interval:    QRS Duration:   QT Interval:    QTC Calculation:   R Axis:     Text Interpretation:              MDM   1. Hyperglycemia   2. Renal insufficiency    Patient with hyperglycemia. Does not appear to be in DKA. Sugars increased over the last 4 days. Will be admitted to internal medicine    Juliet Rude. Rubin Payor, MD 09/17/13 2321

## 2013-09-17 NOTE — Telephone Encounter (Signed)
Agree with immediate referral to ED. 

## 2013-09-17 NOTE — Telephone Encounter (Signed)
Pt lab work came back with Glucose of 646 and Potassium 6.6 Pt was called and asked to return to ED for f/u of these results.  Urgency was stressed. She states she will come back now.  ED Triage nurse called and lab report given.  She will be expecting pt.

## 2013-09-18 DIAGNOSIS — R1013 Epigastric pain: Secondary | ICD-10-CM

## 2013-09-18 DIAGNOSIS — I5032 Chronic diastolic (congestive) heart failure: Secondary | ICD-10-CM

## 2013-09-18 DIAGNOSIS — E1165 Type 2 diabetes mellitus with hyperglycemia: Secondary | ICD-10-CM

## 2013-09-18 DIAGNOSIS — I129 Hypertensive chronic kidney disease with stage 1 through stage 4 chronic kidney disease, or unspecified chronic kidney disease: Secondary | ICD-10-CM

## 2013-09-18 DIAGNOSIS — I2510 Atherosclerotic heart disease of native coronary artery without angina pectoris: Secondary | ICD-10-CM

## 2013-09-18 DIAGNOSIS — Z794 Long term (current) use of insulin: Secondary | ICD-10-CM

## 2013-09-18 LAB — BASIC METABOLIC PANEL
BUN: 32 mg/dL — ABNORMAL HIGH (ref 6–23)
BUN: 27 mg/dL — ABNORMAL HIGH (ref 6–23)
CO2: 22 mEq/L (ref 19–32)
Calcium: 9 mg/dL (ref 8.4–10.5)
Chloride: 92 mEq/L — ABNORMAL LOW (ref 96–112)
Creatinine, Ser: 1.53 mg/dL — ABNORMAL HIGH (ref 0.50–1.10)
GFR calc Af Amer: 35 mL/min — ABNORMAL LOW (ref >90)
GFR calc non Af Amer: 30 mL/min — ABNORMAL LOW (ref >90)
GFR calc non Af Amer: 35 mL/min — ABNORMAL LOW (ref >90)
Glucose, Bld: 170 mg/dL — ABNORMAL HIGH (ref 70–99)
Potassium: 3.7 mEq/L (ref 3.5–5.1)
Sodium: 129 mEq/L — ABNORMAL LOW (ref 135–145)
Sodium: 138 mEq/L (ref 135–145)

## 2013-09-18 LAB — TROPONIN I: Troponin I: <0.3 ng/mL (ref <0.30)

## 2013-09-18 LAB — GLUCOSE, CAPILLARY: Glucose-Capillary: 251 mg/dL — ABNORMAL HIGH (ref 70–99)

## 2013-09-18 MED ORDER — SENNA 8.6 MG PO TABS
1.0000 | ORAL_TABLET | Freq: Once | ORAL | Status: AC
  Administered 2013-09-18: 13:00:00 8.6 mg via ORAL
  Filled 2013-09-18: qty 1

## 2013-09-18 NOTE — Progress Notes (Signed)
NURSING PROGRESS NOTE  [name] [id number] Discharge Data: 09/18/2013 2:57 PM Attending Provider: Jonah Blue, DO WUJ:[id number], Anselm Lis, MD     Dyke Maes to be D/C'd Home per MD order.  Discussed with the patient the After Visit Summary and all questions fully answered. All IV's discontinued with no bleeding noted. All belongings returned to patient for patient to take home.   Last Vital Signs:  Blood pressure 144/73, pulse 82, temperature 98.3 F (36.8 C), temperature source Oral, resp. rate 16, height 5\' 5"  (1.651 m), weight 91.7 kg (202 lb 2.6 oz), SpO2 97.00%.  Discharge Medication List   Medication List         ACCU-CHEK FASTCLIX LANCETS Misc  1 each by Does not apply route 3 (three) times daily before meals. Dx code 250.00     amitriptyline 25 MG tablet  Commonly known as:  ELAVIL  Take 1 tablet (25 mg total) by mouth at bedtime.     aspirin 81 MG chewable tablet  Chew 81 mg by mouth daily.     buPROPion 300 MG 24 hr tablet  Commonly known as:  WELLBUTRIN XL  Take 1 tablet (300 mg total) by mouth daily.     COLACE PO  Take 2 tablets by mouth daily as needed (constipation).     DEXILANT 60 MG capsule  Generic drug:  dexlansoprazole  Take 60 mg by mouth daily.     diclofenac sodium 1 % Gel  Commonly known as:  VOLTAREN  Apply 2 g topically 3 (three) times daily as needed (pain relief).     ferrous sulfate 325 (65 FE) MG tablet  Take 325 mg by mouth 3 (three) times daily as needed (for iron supplement).     FLUoxetine 20 MG capsule  Commonly known as:  PROZAC  Take 1 capsule (20 mg total) by mouth every morning.     furosemide 40 MG tablet  Commonly known as:  LASIX  Take 1 tablet (40 mg total) by mouth daily.     gabapentin 800 MG tablet  Commonly known as:  NEURONTIN  Take 800-1,600 mg by mouth 2 (two) times daily. 800 mg in the am and 1600 mg in the evening.     glucose blood test strip  Commonly known as:  ACCU-CHEK SMARTVIEW  Use to check blood  sugar before meals dx code 250.00     glucose monitoring kit monitoring kit  1 each by Does not apply route as needed for other.     insulin aspart 100 UNIT/ML Sopn FlexPen  Commonly known as:  NOVOLOG FLEXPEN  Inject 10 Units into the skin 3 (three) times daily with meals.     insulin glargine 100 UNIT/ML injection  Commonly known as:  LANTUS  Inject 0.55 mLs (55 Units total) into the skin at bedtime.     Insulin Pen Needle 31G X 5 MM Misc  Box of 100 - uses up to 5 times per day according to CBG     isosorbide mononitrate 60 MG 24 hr tablet  Commonly known as:  IMDUR  Take 1 tablet (60 mg total) by mouth daily.     metoprolol succinate 25 MG 24 hr tablet  Commonly known as:  TOPROL-XL  Take 1 tablet (25 mg total) by mouth daily.     promethazine 25 MG tablet  Commonly known as:  PHENERGAN  Take 1 tablet (25 mg total) by mouth every 6 (six) hours as needed for nausea.  senna 8.6 MG Tabs tablet  Commonly known as:  SENOKOT  Take 4 tablets by mouth daily as needed (constipation).     traMADol 50 MG tablet  Commonly known as:  ULTRAM  Take 1 tablet (50 mg total) by mouth every 8 (eight) hours as needed for pain.

## 2013-09-18 NOTE — Progress Notes (Signed)
UR completed 

## 2013-09-18 NOTE — Progress Notes (Signed)
Nutrition Brief Note  Patient identified on the Malnutrition Screening Tool (MST) Report  Wt Readings from Last 15 Encounters:  09/18/13 202 lb 2.6 oz (91.7 kg)  09/12/13 206 lb (93.441 kg)  08/21/13 207 lb 12.8 oz (94.257 kg)  08/08/13 208 lb (94.348 kg)  08/03/13 207 lb 4.8 oz (94.031 kg)  08/01/13 208 lb (94.348 kg)  07/12/13 207 lb 11.2 oz (94.212 kg)  06/18/13 205 lb 12.8 oz (93.35 kg)  05/23/13 206 lb 6.4 oz (93.622 kg)  05/09/13 203 lb 9.6 oz (92.352 kg)  04/13/13 209 lb 12.8 oz (95.165 kg)  03/29/13 203 lb 3.2 oz (92.171 kg)  03/08/13 198 lb (89.812 kg)  02/21/13 199 lb 8 oz (90.493 kg)  02/15/13 196 lb 12.8 oz (89.268 kg)    Body mass index is 33.64 kg/(m^2). Patient meets criteria for obesity based on current BMI.   Pt reports that she has had no recent wt loss or wt gain. She reports that she has experienced no loss of appetite. Pt said that she did not need nutritional supplementation at this time.  Current diet order is carb modified, patient reports that she is consuming an adequate amount of provided meals at this time. Labs and medications reviewed.   No nutrition interventions warranted at this time. If nutrition issues arise, please consult RD.   Ebbie Latus RD, LDN

## 2013-09-18 NOTE — H&P (Signed)
INTERNAL MEDICINE TEACHING SERVICE Attending Admission Note  Date: 09/18/2013  Patient name: [name]  Medical record number: [id number]  Date of birth: 1951-01-27    I have seen and evaluated [name] and discussed their care with the Residency Team.  62 yr old female with Type 2 DM, HTN, HFpEF, HL, CKD 3, presented abnormal labs from clinic. She was noted to have hyperglycemia, hyperkalemia, and AKI on CKD 3.  She has been using Mobic for pain which was recently discontinued.  She has hx of non adherence to insulin. She states she was given Mobic for "abdominal wall" pain. On exam, she has a recent surgical scar over umbilicus without significant tenderness or distention or guarding. She has +BS.   She admits to occasional substernal chest pain with exertion, but denies any CP currently. She needs PCP follow up with outpatient cardiology follow up. She has hx of LHC in 2011 with 70% RCA stenosis.   At this time, she feels well. Denies CP or SOB. Her Cr has decreased to 1/53. No longer hyperkalemic. She has ruled out for ACS. I would resume her home medications at this time (except no Mobic or NSAIDS). She has responded to IVF.  She needs outpatient follow up with cards. Medically stable for D/C.  Jonah Blue, DO, FACP Faculty [facility] Internal Medicine Residency Program 09/18/2013, 12:07 PM

## 2013-09-18 NOTE — Care Management Note (Signed)
    Page 1 of 1   09/18/2013     10:44:57 AM   CARE MANAGEMENT NOTE 09/18/2013  Patient:  [name]   Account Number:  [id number]  Date Initiated:  09/18/2013  Documentation initiated by:  Letha Cape  Subjective/Objective Assessment:   dx hyperglycemia  admit as observation- lives alone. pta indep.     Action/Plan:   Anticipated DC Date:  09/18/2013   Anticipated DC Plan:  HOME/SELF CARE      DC Planning Services  CM consult      Choice offered to / List presented to:             Status of service:  Completed, signed off Medicare Important Message given?   (If response is "NO", the following Medicare IM given date fields will be blank) Date Medicare IM given:   Date Additional Medicare IM given:    Discharge Disposition:  HOME/SELF CARE  Per UR Regulation:  Reviewed for med. necessity/level of care/duration of stay  If discussed at Long Length of Stay Meetings, dates discussed:    Comments:  09/18/13 10:44 Letha Cape RN, BSN [phone] patient lives alone, patient is for dc today per MD.  No needs anticipated.

## 2013-09-18 NOTE — Discharge Summary (Signed)
Name: [name] MRN: [id number] DOB: 1951-09-25 62 y.o. PCP: Belia Heman, MD  Date of Admission: 09/17/2013  6:56 PM Date of Discharge: 09/18/13 Attending Physician: Kem Kays  Discharge Diagnosis: Principal Problem:   Hyperglycemia Active Problems:   Uncontrolled type 2 diabetes mellitus with insulin therapy   HYPERTENSION   CAD   CKD (chronic kidney disease) stage 3, GFR 30-59 ml/min   Abdominal pain, epigastric   Diastolic CHF  Discharge Medications:   Medication List         ACCU-CHEK FASTCLIX LANCETS Misc  1 each by Does not apply route 3 (three) times daily before meals. Dx code 250.00     amitriptyline 25 MG tablet  Commonly known as:  ELAVIL  Take 1 tablet (25 mg total) by mouth at bedtime.     aspirin 81 MG chewable tablet  Chew 81 mg by mouth daily.     buPROPion 300 MG 24 hr tablet  Commonly known as:  WELLBUTRIN XL  Take 1 tablet (300 mg total) by mouth daily.     COLACE PO  Take 2 tablets by mouth daily as needed (constipation).     DEXILANT 60 MG capsule  Generic drug:  dexlansoprazole  Take 60 mg by mouth daily.     diclofenac sodium 1 % Gel  Commonly known as:  VOLTAREN  Apply 2 g topically 3 (three) times daily as needed (pain relief).     ferrous sulfate 325 (65 FE) MG tablet  Take 325 mg by mouth 3 (three) times daily as needed (for iron supplement).     FLUoxetine 20 MG capsule  Commonly known as:  PROZAC  Take 1 capsule (20 mg total) by mouth every morning.     furosemide 40 MG tablet  Commonly known as:  LASIX  Take 1 tablet (40 mg total) by mouth daily.     gabapentin 800 MG tablet  Commonly known as:  NEURONTIN  Take 800-1,600 mg by mouth 2 (two) times daily. 800 mg in the am and 1600 mg in the evening.     glucose blood test strip  Commonly known as:  ACCU-CHEK SMARTVIEW  Use to check blood sugar before meals dx code 250.00     glucose monitoring kit monitoring kit  1 each by Does not apply route as needed for other.     insulin aspart 100 UNIT/ML Sopn FlexPen  Commonly known as:  NOVOLOG FLEXPEN  Inject 10 Units into the skin 3 (three) times daily with meals.     insulin glargine 100 UNIT/ML injection  Commonly known as:  LANTUS  Inject 0.55 mLs (55 Units total) into the skin at bedtime.     Insulin Pen Needle 31G X 5 MM Misc  Box of 100 - uses up to 5 times per day according to CBG     isosorbide mononitrate 60 MG 24 hr tablet  Commonly known as:  IMDUR  Take 1 tablet (60 mg total) by mouth daily.     metoprolol succinate 25 MG 24 hr tablet  Commonly known as:  TOPROL-XL  Take 1 tablet (25 mg total) by mouth daily.     promethazine 25 MG tablet  Commonly known as:  PHENERGAN  Take 1 tablet (25 mg total) by mouth every 6 (six) hours as needed for nausea.     senna 8.6 MG Tabs tablet  Commonly known as:  SENOKOT  Take 4 tablets by mouth daily as needed (constipation).     traMADol  50 MG tablet  Commonly known as:  ULTRAM  Take 1 tablet (50 mg total) by mouth every 8 (eight) hours as needed for pain.        Disposition and follow-up:   [name] was discharged from [facility] in Stable condition.  At the hospital follow up visit please address:  1.  Medication complaince  2.  Labs / imaging needed at time of follow-up: none  3.  Pending labs/ test needing follow-up: none  Follow-up Appointments: Follow-up Information   Follow up with LI, NA, MD On 09/26/2013. (9a)    Specialty:  Internal Medicine   Contact information:   [address] [place] [address] [phone]       Follow up with Plyler, Lupita Leash, RD On 09/26/2013. (8:30a)    Specialty:  [facility] Barges information:   [address] [place] [address] [phone]       Discharge Instructions: Discharge Orders   Future Appointments Provider Department Dept Phone   09/26/2013 8:30 AM Cecil Cranker Plyler, RD [facility] [phone]   09/26/2013 9:00 AM Ian Bushman  Dierdre Searles, MD [facility] [phone]   09/27/2013 11:30 AM Geanie Berlin, LCSW [facility] [place] [phone]   10/10/2013 11:30 AM Geanie Berlin, LCSW [facility] OUTPAT[facility] [phone]   10/29/2013 2:00 PM Belia Heman, MD [facility] [phone]   Future Orders Complete By Expires   Call MD for:  persistant nausea and vomiting  As directed    Call MD for:  severe uncontrolled pain  As directed    Diet - low sodium heart healthy  As directed    Increase activity slowly  As directed       Consultations:  none  Admission HPI:  Ms. Sakamoto is a 62 year old African American female with PMH of uncontrolled DM2, HTN, HL, diastolic CHF, CKD (baseline Cr 1.5-1.7), and GERD presenting to the ED today for hyperglycemia. She was last seen in [facility] by PCP Dr. Everardo Beals on 09/12/13. Lab work the next day on 11/20 revealed K 5.6, Cr 2.12 with glucose of 353 when she was instructed to stop Cozaar and Mobic and continue insulin. She was subsequently asked to return today for lab work which revealed glucose of 646 with K 6.6 and Na 125 with Cr 2.14 but sample was hemolyzed. She was called and asked to return to the ED. Repeat lab work in ED showed glucose of 632 and K of 5. Ms. Verdejo admits to missing mealtime insulin doses. She reports since her visit with Dr. Everardo Beals she has not gotten a new meter, but called and asked company to send her batteries for meter. She has been giving herself lantus 55 units nightly, and did take it last night. Keeps forgetting to take daytime insulin, last time took it approximately 1-2 weeks ago. But has not taken regularly for the last month.  Today she complains of weakness and fatigue, epigastric abdominal pain, nausea without vomiting, polyuria and polydipsia. She denies SOB, fever, chills, cough, dysuria. She admits occasional substernal chest pain last episode maybe days to weeks ago.  When pain comes it last for 10-15 minutes is associated with exertional activities and is relieved by rest. Last cardiac cath was in 2011 with 60-70% RCA stenosis.   Hospital Course by problem list: 1. Hyperglycemia in DM2- Likely secondary to noncompliance as patient reported not taking insulin for past month.  Last A1C 13.4 on 09/12/13, glucose >600 on admission with AG of 15.  However, DKA was unlikely given no ketones in urine, no nausea/vomiting or diarrhea. CGB improved to 381 after Novolog 10 units and IVFs (NS 1L bolus then 150 cc/hr).  Patient was also placed on Lantus 25 units qhs.  Close outpatient follow-up was arranged with [facility] resident as well as diabetes educator.    2. CKD3- Baseline Cr 1.5-1.7, elevated on admission to 1.86-2.4 (labs highly variable).  Likely prerenal etiology due to dehydration from hyperglycemia.  Volume resuscitated with IVFs per above.  Cr 1.5 on day of discharge.    3. HTN- stable.  Continued home meds metoprolol 25 mg daily, Imdur 60 mg daily, Lasix 40 mg daily while inpatient and at discharge.   4. Abdominal pain, epigastric- Chronic, patient follows with Dr. Matthias Hughs and had normal gastric emptying study. Lipase mildly elevated at 75, but baseline in 60s and no clinical concern for pancreatitis.  Eating well. Continued Protonix 40 mg daily while inpatient and at discharge.   5. CAD- Patient had catheterization on 11/18/09 which showed circumflex 50%, RCA 60-70%, medical tx, normal LVF as well as normal nuclear stress test on 02/03/10.  She is followed by Dr. Eden Emms of [facility] cardiology.   No chest pain on admission or day of discharge though patient reported some previous intermittent chest pain to admitting team.  Troponins x 2 negative.  Continued ASA 81 mg daily while inpatient and at discharge.    6. dCHF- Last echo on  07/06/13 showed EF 55-60% with grade 1 diastolic dysfunction.  Gave IVFs with caution though no evidence of volume overload on exam.      Discharge Vitals:   BP 144/73  Pulse 82  Temp(Src) 98.3 F (36.8 C) (Oral)  Resp 16  Ht 5\' 5"  (1.651 m)  Wt 202 lb 2.6 oz (91.7 kg)  BMI 33.64 kg/m2  SpO2 97%  Discharge Labs:  Results for orders placed during the hospital encounter of 09/17/13 (from the past 24 hour(s))  GLUCOSE, CAPILLARY     Status: Abnormal   Collection Time    09/18/13  8:23 AM      Result Value Range   Glucose-Capillary 189 (*) 70 - 99 mg/dL  GLUCOSE, CAPILLARY     Status: Abnormal   Collection Time    09/18/13 11:54 AM      Result Value Range   Glucose-Capillary 251 (*) 70 - 99 mg/dL    Signed: Rocco Serene, MD 09/19/2013, 8:07 AM   Time Spent on Discharge: 35 minutes Services Ordered on Discharge: none Equipment Ordered on Discharge: none

## 2013-09-22 NOTE — Discharge Summary (Signed)
  Date: 09/22/2013  Patient name: [name]  Medical record number: [id number]  Date of birth: 10-29-1950   This patient has been seen and the plan of care was discussed with the house staff. Please see their note for complete details. I concur with their findings and plan.  Jonah Blue, DO, FACP Faculty [facility] Internal Medicine Residency Program 09/22/2013, 7:56 PM

## 2013-09-26 ENCOUNTER — Ambulatory Visit (INDEPENDENT_AMBULATORY_CARE_PROVIDER_SITE_OTHER): Payer: PRIVATE HEALTH INSURANCE | Admitting: Internal Medicine

## 2013-09-26 ENCOUNTER — Ambulatory Visit (INDEPENDENT_AMBULATORY_CARE_PROVIDER_SITE_OTHER): Payer: 59 | Admitting: Psychiatry

## 2013-09-26 ENCOUNTER — Encounter: Payer: Self-pay | Admitting: Internal Medicine

## 2013-09-26 ENCOUNTER — Ambulatory Visit (INDEPENDENT_AMBULATORY_CARE_PROVIDER_SITE_OTHER): Payer: PRIVATE HEALTH INSURANCE | Admitting: Dietician

## 2013-09-26 ENCOUNTER — Other Ambulatory Visit: Payer: Self-pay | Admitting: Internal Medicine

## 2013-09-26 ENCOUNTER — Ambulatory Visit: Payer: Self-pay | Admitting: Internal Medicine

## 2013-09-26 VITALS — BP 149/71 | HR 82 | Temp 98.4°F | Ht 65.0 in | Wt 204.6 lb

## 2013-09-26 VITALS — BP 136/111 | HR 89 | Wt 204.8 lb

## 2013-09-26 DIAGNOSIS — F332 Major depressive disorder, recurrent severe without psychotic features: Secondary | ICD-10-CM

## 2013-09-26 DIAGNOSIS — E1165 Type 2 diabetes mellitus with hyperglycemia: Secondary | ICD-10-CM

## 2013-09-26 DIAGNOSIS — F339 Major depressive disorder, recurrent, unspecified: Secondary | ICD-10-CM

## 2013-09-26 DIAGNOSIS — IMO0001 Reserved for inherently not codable concepts without codable children: Secondary | ICD-10-CM

## 2013-09-26 DIAGNOSIS — F333 Major depressive disorder, recurrent, severe with psychotic symptoms: Secondary | ICD-10-CM

## 2013-09-26 DIAGNOSIS — F329 Major depressive disorder, single episode, unspecified: Secondary | ICD-10-CM

## 2013-09-26 DIAGNOSIS — I10 Essential (primary) hypertension: Secondary | ICD-10-CM

## 2013-09-26 LAB — GLUCOSE, CAPILLARY: Glucose-Capillary: 335 mg/dL — ABNORMAL HIGH (ref 70–99)

## 2013-09-26 MED ORDER — BUPROPION HCL ER (XL) 150 MG PO TB24: 450.0000 mg | ORAL_TABLET | Freq: Every day | ORAL | Status: DC

## 2013-09-26 MED ORDER — TEMAZEPAM 15 MG PO CAPS: 15.0000 mg | ORAL_CAPSULE | Freq: Every evening | ORAL | Status: DC | PRN

## 2013-09-26 MED ORDER — FLUOXETINE HCL 20 MG PO CAPS: 20.0000 mg | ORAL_CAPSULE | Freq: Every morning | ORAL | Status: DC

## 2013-09-26 NOTE — Patient Instructions (Addendum)
General Instructions: Please make a follow up appointment with [name] in 3-4 weeks Please take your medications as prescribed Lantus 55 units at night and Novolog units with meals  Read all the information below  Please change your appt with Dr. Everardo Beals to 12/13/12  Take care   Treatment Goals:  Goals (1 Years of Data) as of 09/26/13         As of Today 09/18/13 09/18/13 09/18/13 09/17/13     Blood Pressure    . Blood Pressure < 150/90  149/71 144/73 119/58 123/73 134/79     Result Component    . HEMOGLOBIN A1C < 7.0          . LDL CALC < 100            Progress Toward Treatment Goals:  Treatment Goal 09/26/2013  Hemoglobin A1C deteriorated  Blood pressure unable to assess    Self Care Goals & Plans:  Self Care Goal 09/26/2013  Manage my medications take my medicines as prescribed; bring my medications to every visit; refill my medications on time  Monitor my health keep track of my blood pressure; bring my glucose meter and log to each visit; keep track of my blood glucose; bring my blood pressure log to each visit; keep track of my weight; check my feet daily  Eat healthy foods drink diet soda or water instead of juice or soda; eat more vegetables; eat foods that are low in salt; eat baked foods instead of fried foods; eat fruit for snacks and desserts; eat smaller portions  Be physically active find an activity I enjoy  Meeting treatment goals maintain the current self-care plan    Home Blood Glucose Monitoring 09/26/2013  Check my blood sugar 3 times a day  When to check my blood sugar before meals     Care Management & Community Referrals:  Referral 09/26/2013  Referrals made for care management support diabetes educator  Referrals made to community resources none       Exercise to Lose Weight Exercise and a healthy diet may help you lose weight. Your doctor may suggest specific exercises. EXERCISE IDEAS AND TIPS  Choose low-cost things you enjoy doing,  such as walking, bicycling, or exercising to workout videos.  Take stairs instead of the elevator.  Walk during your lunch break.  Park your car further away from work or school.  Go to a gym or an exercise class.  Start with 5 to 10 minutes of exercise each day. Build up to 30 minutes of exercise 4 to 6 days a week.  Wear shoes with good support and comfortable clothes.  Stretch before and after working out.  Work out until you breathe harder and your heart beats faster.  Drink extra water when you exercise.  Do not do so much that you hurt yourself, feel dizzy, or get very short of breath. Exercises that burn about 150 calories:  Running 1  miles in 15 minutes.  Playing volleyball for 45 to 60 minutes.  Washing and waxing a car for 45 to 60 minutes.  Playing touch football for 45 minutes.  Walking 1  miles in 35 minutes.  Pushing a stroller 1  miles in 30 minutes.  Playing basketball for 30 minutes.  Raking leaves for 30 minutes.  Bicycling 5 miles in 30 minutes.  Walking 2 miles in 30 minutes.  Dancing for 30 minutes.  Shoveling snow for 15 minutes.  Swimming laps for 20 minutes.  Walking  up stairs for 15 minutes.  Bicycling 4 miles in 15 minutes.  Gardening for 30 to 45 minutes.  Jumping rope for 15 minutes.  Washing windows or floors for 45 to 60 minutes. Document Released: 11/13/2010 Document Revised: 01/03/2012 Document Reviewed: 11/13/2010 [facility] Patient Information 2014 [place], [place].  DASH Diet The DASH diet stands for "Dietary Approaches to Stop Hypertension." It is a healthy eating plan that has been shown to reduce high blood pressure (hypertension) in as little as 14 days, while also possibly providing other significant health benefits. These other health benefits include reducing the risk of breast cancer after menopause and reducing the risk of type 2 diabetes, heart disease, colon cancer, and stroke. Health benefits also include  weight loss and slowing kidney failure in patients with chronic kidney disease.  DIET GUIDELINES  Limit salt (sodium). Your diet should contain less than 1500 mg of sodium daily.  Limit refined or processed carbohydrates. Your diet should include mostly whole grains. Desserts and added sugars should be used sparingly.  Include small amounts of heart-healthy fats. These types of fats include nuts, oils, and tub margarine. Limit saturated and trans fats. These fats have been shown to be harmful in the body. CHOOSING FOODS  The following food groups are based on a 2000 calorie diet. See your Registered Dietitian for individual calorie needs. Grains and Grain Products (6 to 8 servings daily)  Eat More Often: Whole-wheat bread, brown rice, whole-grain or wheat pasta, quinoa, popcorn without added fat or salt (air popped).  Eat Less Often: White bread, white pasta, white rice, cornbread. Vegetables (4 to 5 servings daily)  Eat More Often: Fresh, frozen, and canned vegetables. Vegetables may be raw, steamed, roasted, or grilled with a minimal amount of fat.  Eat Less Often/Avoid: Creamed or fried vegetables. Vegetables in a cheese sauce. Fruit (4 to 5 servings daily)  Eat More Often: All fresh, canned (in natural juice), or frozen fruits. Dried fruits without added sugar. One hundred percent fruit juice ( cup [237 mL] daily).  Eat Less Often: Dried fruits with added sugar. Canned fruit in light or heavy syrup. [employer], Fish, and Poultry (2 servings or less daily. One serving is 3 to 4 oz [85-114 g]).  Eat More Often: Ninety percent or leaner ground beef, tenderloin, sirloin. Round cuts of beef, chicken breast, [place] breast. All fish. Grill, bake, or broil your meat. Nothing should be fried.  Eat Less Often/Avoid: Fatty cuts of meat, [place], or chicken leg, thigh, or wing. Fried cuts of meat or fish. Dairy (2 to 3 servings)  Eat More Often: Low-fat or fat-free milk, low-fat plain or  light yogurt, reduced-fat or part-skim cheese.  Eat Less Often/Avoid: Milk (whole, 2%).Whole milk yogurt. Full-fat cheeses. Nuts, Seeds, and Legumes (4 to 5 servings per week)  Eat More Often: All without added salt.  Eat Less Often/Avoid: Salted nuts and seeds, canned beans with added salt. Fats and Sweets (limited)  Eat More Often: Vegetable oils, tub margarines without trans fats, sugar-free gelatin. Mayonnaise and salad dressings.  Eat Less Often/Avoid: Coconut oils, palm oils, butter, stick margarine, cream, half and half, cookies, candy, pie. FOR MORE INFORMATION The Dash Diet Eating Plan: www.dashdiet.org Document Released: 09/30/2011 Document Revised: 01/03/2012 Document Reviewed: 09/30/2011 [facility] Patient Information 2014 [place], [place].   Hypertension As your heart beats, it forces blood through your arteries. This force is your blood pressure. If the pressure is too high, it is called hypertension (HTN) or high blood pressure. HTN is  dangerous because you may have it and not know it. High blood pressure may mean that your heart has to work harder to pump blood. Your arteries may be narrow or stiff. The extra work puts you at risk for heart disease, stroke, and other problems.  Blood pressure consists of two numbers, a higher number over a lower, 110/72, for example. It is stated as "110 over 72." The ideal is below 120 for the top number (systolic) and under 80 for the bottom (diastolic). Write down your blood pressure today. You should pay close attention to your blood pressure if you have certain conditions such as:  Heart failure.  Prior heart attack.  Diabetes  Chronic kidney disease.  Prior stroke.  Multiple risk factors for heart disease. To see if you have HTN, your blood pressure should be measured while you are seated with your arm held at the level of the heart. It should be measured at least twice. A one-time elevated blood pressure reading (especially in  the Emergency Department) does not mean that you need treatment. There may be conditions in which the blood pressure is different between your right and left arms. It is important to see your caregiver soon for a recheck. Most people have essential hypertension which means that there is not a specific cause. This type of high blood pressure may be lowered by changing lifestyle factors such as:  Stress.  Smoking.  Lack of exercise.  Excessive weight.  Drug/tobacco/alcohol use.  Eating less salt. Most people do not have symptoms from high blood pressure until it has caused damage to the body. Effective treatment can often prevent, delay or reduce that damage. TREATMENT  When a cause has been identified, treatment for high blood pressure is directed at the cause. There are a large number of medications to treat HTN. These fall into several categories, and your caregiver will help you select the medicines that are best for you. Medications may have side effects. You should review side effects with your caregiver. If your blood pressure stays high after you have made lifestyle changes or started on medicines,   Your medication(s) may need to be changed.  Other problems may need to be addressed.  Be certain you understand your prescriptions, and know how and when to take your medicine.  Be sure to follow up with your caregiver within the time frame advised (usually within two weeks) to have your blood pressure rechecked and to review your medications.  If you are taking more than one medicine to lower your blood pressure, make sure you know how and at what times they should be taken. Taking two medicines at the same time can result in blood pressure that is too low. SEEK IMMEDIATE MEDICAL CARE IF:  You develop a severe headache, blurred or changing vision, or confusion.  You have unusual weakness or numbness, or a faint feeling.  You have severe chest or abdominal pain, vomiting, or  breathing problems. MAKE SURE YOU:   Understand these instructions.  Will watch your condition.  Will get help right away if you are not doing well or get worse. Document Released: 10/11/2005 Document Revised: 01/03/2012 Document Reviewed: 05/31/2008 [facility] Patient Information 2014 [place], [place].  Type 2 Diabetes Mellitus, Adult Type 2 diabetes mellitus is a long-term (chronic) disease. In type 2 diabetes:  The pancreas does not make enough of a hormone called insulin.  The cells in the body do not respond as well to the insulin that is made.  Both of the above can happen. Normally, insulin moves sugars from food into tissue cells. This gives you energy. If you have type 2 diabetes, sugars cannot be moved into tissue cells. This causes high blood sugar (hyperglycemia).  HOME CARE  Have your hemoglobin A1c level checked twice a year. The level shows if your diabetes is under control or out of control.  Perform daily blood sugar testing as told by your doctor.  Check your keytone levels by testing your pee (urine) when you are sick and as told.  Take your diabetes or insulin medicine as told by your doctor.  Never run out of insulin.  Adjust how much insulin you give yourself based on how many carbs (carbohydrates) you eat. Carbs are in many foods, such as fruits, vegetables, whole grains, and dairy products.  Have a healthy snack between every healthy meal. Have 3 meals and 3 snacks a day.  Lose weight if you are overweight.  Carry a medical alert card or wear your medical alert jewelry.  Carry a 15 gram carb snack with you at all times. Examples include:  Glucose pills, 3 or 4.  Glucose gel, 15 gram tube.  Raisins, 2 tablespoons (24 grams).  Jelly beans, 6.  Animal crackers, 8.  Sugar pop, 4 ounces (120 milliliters).  Gummy treats, 9.  Notice low blood sugar (hypoglycemia) symptoms, such as:  Shaking (tremors).  Decreased ability to think  clearly.  Sweating.  Increased heart rate.  Headache.  Dry mouth.  Hunger.  Crabbiness (irritability).  Being worried or tense (anxiety).  Restless sleep.  A change in speech or coordination.  Confusion.  Treat low blood sugar right away. If you are alert and can swallow, follow the 15:15 rule:  Take 15 20 grams of a rapid-acting glucose or carb. This includes glucose gel, glucose pills, or 4 ounces (120 milliters) of fruit juice, regular pop, or low-fat milk.  Check your blood sugar level after taking the glucose.  Take 15 20 grams of more glucose if the repeat blood sugar level is still 70 mg/dL (milligrams/deciliter) or below.  Eat a meal or snack within 1 hour of the blood sugar levels going back to normal.  Notice early symptoms of high blood sugar, such as:  Being really thirsty or drinking a lot (polydipsia).  Peeing (urinating) a lot (polyuria).  Do at least 150 minutes of physical activitity a week or as told.  Split the 150 minutes of activity up during the week. Do not do 150 minutes of activity in one day.  Perform exercises, such as weight lifting, at least 2 times a week or as told.  Adjust your insulin or food intake as needed if you start a new exercise or sport.  Follow your sick day plan when you are not able to eat or drink as usual.  Avoid tobacco use.  Women who are not pregnant should drink no more than 1 drink a day. Men should drink no more than 2 drinks a day.  Only drink alcohol with food.  Ask your doctor if alcohol is safe for you.  Tell your doctor if you drink alcohol several times during the week.  See your doctor regularly.  Schedule an eye exam soon after you are diagnosed with diabetes. Schedule exams once every year.  Check your skin and feet every day. Check for cuts, bruises, redness, nail problems, bleeding, blisters, or sores. A doctor should do a foot exam once a year.  Brush your teeth  and gums twice a day. Floss  once a day. Visit your dentist regularly.  Share your diabetes plan with your workplace or school.  Stay up-to-date with shots that fight against diseases (immunizations).  Learn how to manage stress.  Get diabetes education and support as needed.  Ask your doctor for special help if:  You need help to maintain or improve how you to do things on your own.  You need help to maintain or improve the quality of your life.  You have foot or hand problems.  You have trouble cleaning yourself, dressing, eating, or doing physical activity. GET HELP RIGHT AWAY IF:  You have trouble breathing.  You have moderate to large ketone levels.  You are unable to eat food or drink fluids for more than 6 hours.  You feel sick to your stomach (nauseous) or throw up (vomit) for more than 6 hours.  Your blood sugar level is over 240 mg/dL.  There is a change in mental status.  You get another serious illness.  You have watery poop (diarrhea) for more than 6 hours.  You have been sick or have had a fever for 2 or more days and are not getting better.  You have pain when you are physically active. MAKE SURE YOU:  Understand these instructions.  Will watch your condition.  Will get help right away if you are not doing well or get worse. Document Released: 07/20/2008 Document Revised: 07/05/2012 Document Reviewed: 02/09/2013 [facility] Patient Information 2014 [place], [place].   Fall Prevention and Home Safety Falls cause injuries and can affect all age groups. It is possible to use preventive measures to significantly decrease the likelihood of falls. There are many simple measures which can make your home safer and prevent falls. OUTDOORS  Repair cracks and edges of walkways and driveways.  Remove high doorway thresholds.  Trim shrubbery on the main path into your home.  Have good outside lighting.  Clear walkways of tools, rocks, debris, and clutter.  Check that handrails are  not broken and are securely fastened. Both sides of steps should have handrails.  Have leaves, snow, and ice cleared regularly.  Use sand or salt on walkways during winter months.  In the garage, clean up grease or oil spills. BATHROOM  Install night lights.  Install grab bars by the toilet and in the tub and shower.  Use non-skid mats or decals in the tub or shower.  Place a plastic non-slip stool in the shower to sit on, if needed.  Keep floors dry and clean up all water on the floor immediately.  Remove soap buildup in the tub or shower on a regular basis.  Secure bath mats with non-slip, double-sided rug tape.  Remove throw rugs and tripping hazards from the floors. BEDROOMS  Install night lights.  Make sure a bedside light is easy to reach.  Do not use oversized bedding.  Keep a telephone by your bedside.  Have a firm chair with side arms to use for getting dressed.  Remove throw rugs and tripping hazards from the floor. KITCHEN  Keep handles on pots and pans turned toward the center of the stove. Use back burners when possible.  Clean up spills quickly and allow time for drying.  Avoid walking on wet floors.  Avoid hot utensils and knives.  Position shelves so they are not too high or low.  Place commonly used objects within easy reach.  If necessary, use a sturdy step stool with a grab  bar when reaching.  Keep electrical cables out of the way.  Do not use floor polish or wax that makes floors slippery. If you must use wax, use non-skid floor wax.  Remove throw rugs and tripping hazards from the floor. STAIRWAYS  Never leave objects on stairs.  Place handrails on both sides of stairways and use them. Fix any loose handrails. Make sure handrails on both sides of the stairways are as long as the stairs.  Check carpeting to make sure it is firmly attached along stairs. Make repairs to worn or loose carpet promptly.  Avoid placing throw rugs at the  top or bottom of stairways, or properly secure the rug with carpet tape to prevent slippage. Get rid of throw rugs, if possible.  Have an electrician put in a light switch at the top and bottom of the stairs. OTHER FALL PREVENTION TIPS  Wear low-heel or rubber-soled shoes that are supportive and fit well. Wear closed toe shoes.  When using a stepladder, make sure it is fully opened and both spreaders are firmly locked. Do not climb a closed stepladder.  Add color or contrast paint or tape to grab bars and handrails in your home. Place contrasting color strips on first and last steps.  Learn and use mobility aids as needed. Install an electrical emergency response system.  Turn on lights to avoid dark areas. Replace light bulbs that burn out immediately. Get light switches that glow.  Arrange furniture to create clear pathways. Keep furniture in the same place.  Firmly attach carpet with non-skid or double-sided tape.  Eliminate uneven floor surfaces.  Select a carpet pattern that does not visually hide the edge of steps.  Be aware of all pets. OTHER HOME SAFETY TIPS  Set the water temperature for 120 F (48.8 C).  Keep emergency numbers on or near the telephone.  Keep smoke detectors on every level of the home and near sleeping areas. Document Released: 10/01/2002 Document Revised: 04/11/2012 Document Reviewed: 12/31/2011 [facility] Patient Information 2014 [place], [place].  Fall Prevention and Home Safety Falls cause injuries and can affect all age groups. It is possible to prevent falls.  HOW TO PREVENT FALLS  Wear shoes with rubber soles that do not have an opening for your toes.  Keep the inside and outside of your house well lit.  Use night lights throughout your home.  Remove clutter from floors.  Clean up floor spills.  Remove throw rugs or fasten them to the floor with carpet tape.  Do not place electrical cords across pathways.  Put grab bars by your tub,  shower, and toilet. Do not use towel bars as grab bars.  Put handrails on both sides of the stairway. Fix loose handrails.  Do not climb on stools or stepladders, if possible.  Do not wax your floors.  Repair uneven or unsafe sidewalks, walkways, or stairs.  Keep items you use a lot within reach.  Be aware of pets.  Keep emergency numbers next to the telephone.  Put smoke detectors in your home and near bedrooms. Ask your doctor what other things you can do to prevent falls. Document Released: 08/07/2009 Document Revised: 04/11/2012 Document Reviewed: 01/11/2012 [facility] Patient Information 2014 [place], [place].

## 2013-09-26 NOTE — Assessment & Plan Note (Signed)
Lab Results  Component Value Date   HGBA1C 13.4 09/12/2013   HGBA1C 7.2 05/23/2013   HGBA1C 7.3 02/21/2013     Assessment: Diabetes control: poor control (HgbA1C >9%) Progress toward A1C goal:  deteriorated Comments: uncontrolled pt is diet and med noncompliant   Plan: Medications:  continue current medications. Will not change b/c pt is not taking Novolog 10 units tid with meals.  Continue Lantus 55 units qhs  Home glucose monitoring: Frequency: 3 times a day Timing: before meals Instruction/counseling given: reminded to get eye exam, reminded to bring blood glucose meter & log to each visit, reminded to bring medications to each visit, provided printed educational material and other instruction/counseling: medication compliance  Educational resources provided: brochure;handout Self management tools provided: copy of home glucose meter download;other (see comments) Other plans: f/u with PCP 11/2013

## 2013-09-26 NOTE — Progress Notes (Signed)
Medical Nutrition Therapy:  Appt start time: 0830 end time:  0930. Visit 1 this year.  Assessment:  Primary concerns today: Blood sugar control and support.  Overwhelmed by diabetes care. Stopped taking her mealtime insulin. Reports her emotions are driving her intake of sweets and decreased diabetes self care. Tearful and reports poor judgement and concentration and isolation today. Has appointments with her psychiatrist and counselor this week. ,  Usual eating pattern includes irregular meals and  Snacks.   Frequent foods include cakes, cookies, candy, pies, fruit.  Avoided foods include not discussed today.   Eating erratically, lot's of sweets, candy at night is a problem for her. Has not been eating fried foods or pork rinds.   Blood sugar: meter downloaded obvious loss of control over the past few months since  Stopped taking meal time insulin  Progress Towards Goal(s):  Modified goal(s).   Nutritional Diagnosis:  NB-1.2 Harmful beliefs/attitudes about food or nutrition-related topics (use with caution) As related to using sweets as a coping mechanism for her deperssion.  As evidenced by her report of food fixation and comsuing it with knowledge that it has negative health consequences sucah as hyperglycemia.    Intervention:  Nutrition counseling to assist patient with recognizing unhealthy behaviors. Emotional support provided for self management tasks patient has been performing.  Encouraged her to learn how to work sweets in when emotional ready.   Coordination of care- consider decreasing burden of diabetes by using intentise insulin therapy light. One injection with meal of her choice vs dinner.   Monitoring/Evaluation:  Dietary intake, exercise, blood sugars, and body weight in 4 week(s).

## 2013-09-26 NOTE — Assessment & Plan Note (Signed)
Denies SI. Will f/u with behavioral health today

## 2013-09-26 NOTE — Assessment & Plan Note (Signed)
BP Readings from Last 3 Encounters:  09/26/13 149/71  09/18/13 144/73  09/12/13 121/85    Lab Results  Component Value Date   NA 138 09/18/2013   K 5.0 09/18/2013   CREATININE 1.53* 09/18/2013    Assessment: Blood pressure control: mildly elevated Progress toward BP goal:  unable to assess Comments: slightly elevated today pt has not taken her meds  Plan: Medications:  continue current medications Educational resources provided: brochure;handout Self management tools provided: other (see comments) Other plans: none

## 2013-09-26 NOTE — Progress Notes (Signed)
   Subjective:    Patient ID: [name], female    DOB: [birth year], 62 y.o.   MRN: [id number]  HPI Comments: [name] is a 62 y.o F Past Medical History Hypertension (BP 149/71), Peripheral neuropathy, Cervicalgia, Low back pain syndrome, Hyperlipidemia, Vitamin D deficiency, Vitamin B12 deficiency, Iliotibial band syndrome, Anemia, Borderline personality disorder, Nonorganic psychosis, GERD (gastroesophageal reflux disease), Diabetes mellitus (uncontrolled HA1C 13.4% 11/19 previously 7.2 05/23/13; cbg today was 335 fasting), Depression, Diabetic gastroparesis, Sleep apnea, CAD (coronary artery disease) (Catherization 11/18/09>nonobstructive normal LV function, EF 65%), Headache, Arthritis, SBO (small bowel obstruction) surgery 12/2012 (Ultram helps some w/ chronic ab pain), Diastolic CHF (Grade I, on Echo 06/2013, EF 55-60%), chronic back pain (follows with a clinic to do injections appt was 09/27/13 but declined to do injections on pt due to hyperglycemia so she will not have injections).   She presents HFU for hyperglycemia >600 09/12/13.  She met with Gavin Pound this am.  She is compliant with Lantus but not taking her Novolog 10 units with meals.  She reports diet noncompliance as well.  Her avg cbgs have been 286 with highest >500 and lowest 72.  She denies hypoglycemic sx's.  Am cbgs 200s-400s, lunch 200s->500s, dinner 300s-500+  She reports depression due to health issues, marriage issues.  She denies SI.  She has appt with [facility] behavioral health this afternoon.    SH: lives with husband, has 2 kids. Married x 17 years   HM: She needs to the flu shot today but declines.       Review of Systems  Constitutional: Positive for fatigue.  Respiratory: Negative for shortness of breath.   Cardiovascular: Negative for chest pain.  Gastrointestinal:       Chronic ab pain        Objective:   Physical Exam  Nursing note and vitals reviewed. Constitutional: She is oriented to person, place,  and time. She appears well-developed and well-nourished. She is cooperative. No distress.  HENT:  Head: Normocephalic and atraumatic.  Mouth/Throat: Oropharynx is clear and moist and mucous membranes are normal. She has dentures. No oropharyngeal exudate.  Eyes: Conjunctivae are normal. Pupils are equal, round, and reactive to light. Right eye exhibits no discharge. Left eye exhibits no discharge. No scleral icterus.  Cardiovascular: Normal rate, regular rhythm, S1 normal, S2 normal and normal heart sounds.   No murmur heard. No lower ext edema  Pulmonary/Chest: Effort normal and breath sounds normal. No respiratory distress. She has no wheezes.  Abdominal: Soft. Bowel sounds are normal. There is no tenderness.  Obese ab Scar to ab s/p surgery   Neurological: She is alert and oriented to person, place, and time. Gait normal.  CN 2-12 grossly intact  Skin: Skin is warm, dry and intact. No rash noted. She is not diaphoretic.  Psychiatric: Her speech is normal and behavior is normal. Judgment and thought content normal. Cognition and memory are normal. She exhibits a depressed mood.          Assessment & Plan:  F/u with Dr. Everardo Beals 12/13/12

## 2013-09-26 NOTE — Progress Notes (Signed)
[facility] MD Progress Note  09/26/2013 3:11 PM [name]  MRN:  [id number] Subjective:  Worse Today the patient is seen one time. She shares that she feels worse. She feels more angry and more irritable. On the other hand she says her abdominal pain is nearly absent in her chest pain is gone. The patient says she feels more depressed. It is evident that this is related to dysfunctional marriage. She is angry at her husband for not being supportive. The patient says her sleep is changed yet she is not taking naps and doesn't feel sleepy. She has difficulty staying asleep. I am certain she is thinking about things in the middle of night and is waking her. The patient is eating well. She is her chronic poor level of energy. The patient claims that she's lost her ability to enjoy things other than perhaps a television. The patient is thinking and concentrating well and denies worthlessness. Her major complaint is around irritability. The patient is upset with her children and she feels exhausted from them. Her Thanksgiving went fairly well and she is looking forward to Christmas. It should be noted this patient has made multiple suicide attempts in her life the last one being in 2001. She's also been a psychiatric hospital multiple times the last one being in 2001. Once again the patient denies being suicidal at this time. The patient is involved in talking therapy and does speak a lot about her husband and her relationship. Diagnosis:   DSM5: Schizophrenia Disorders:   Obsessive-Compulsive Disorders:   Trauma-Stressor Disorders:   Substance/Addictive Disorders:   Depressive Disorders:  Major Depressive Disorder - Mild (296.21)  Axis I: Major Depression, Recurrent severe  ADL's:  Intact  Sleep: Fair  Appetite:  Good  Suicidal Ideation:  no Homicidal Ideation:  no AEB (as evidenced by):  Psychiatric Specialty Exam: ROS  Blood pressure 136/111, pulse 89, weight 204 lb 12.8 oz (92.897 kg).Body mass  index is 34.08 kg/(m^2).  General Appearance: Casual  Eye Contact::  Good  Speech:  Clear and Coherent  Volume:  Normal  Mood:  Anxious  Affect:  Appropriate  Thought Process:  Coherent  Orientation:  Full (Time, Place, and Person)  Thought Content:  WDL  Suicidal Thoughts:  No  Homicidal Thoughts:  No  Memory:  nl  Judgement:  Good  Insight:  Fair  Psychomotor Activity:  Normal  Concentration:  Good  Recall:  Good  Akathisia:  No  Handed:  Right  AIMS (if indicated):     Assets:  Desire for Improvement  Sleep:      Current Medications: Current Outpatient Prescriptions  Medication Sig Dispense Refill  . ACCU-CHEK FASTCLIX LANCETS MISC 1 each by Does not apply route 3 (three) times daily before meals. Dx code 250.00  102 each  12  . amitriptyline (ELAVIL) 25 MG tablet Take 1 tablet (25 mg total) by mouth at bedtime.  30 tablet  1  . aspirin 81 MG chewable tablet Chew 81 mg by mouth daily.      Marland Kitchen buPROPion (WELLBUTRIN XL) 150 MG 24 hr tablet Take 3 tablets (450 mg total) by mouth daily.  90 tablet  5  . dexlansoprazole (DEXILANT) 60 MG capsule Take 60 mg by mouth daily.        . diclofenac sodium (VOLTAREN) 1 % GEL Apply 2 g topically 3 (three) times daily as needed (pain relief).      Tery Sanfilippo Sodium (COLACE PO) Take 2 tablets by mouth daily  as needed (constipation).      . ferrous sulfate 325 (65 FE) MG tablet Take 325 mg by mouth 3 (three) times daily as needed (for iron supplement).      Marland Kitchen FLUoxetine (PROZAC) 20 MG capsule Take 1 capsule (20 mg total) by mouth every morning.  30 capsule  5  . furosemide (LASIX) 40 MG tablet Take 1 tablet (40 mg total) by mouth daily.  30 tablet  11  . gabapentin (NEURONTIN) 800 MG tablet Take 800-1,600 mg by mouth 2 (two) times daily. 800 mg in the am and 1600 mg in the evening.      Marland Kitchen glucose blood (ACCU-CHEK SMARTVIEW) test strip Use to check blood sugar before meals dx code 250.00  100 each  12  . glucose monitoring kit (FREESTYLE)  monitoring kit 1 each by Does not apply route as needed for other.  1 each  0  . insulin aspart (NOVOLOG FLEXPEN) 100 UNIT/ML SOPN FlexPen Inject 10 Units into the skin 3 (three) times daily with meals.  5 pen  12  . insulin glargine (LANTUS) 100 UNIT/ML injection Inject 0.55 mLs (55 Units total) into the skin at bedtime.  3 pen  12  . Insulin Pen Needle 31G X 5 MM MISC Box of 100 - uses up to 5 times per day according to CBG  100 each  11  . isosorbide mononitrate (IMDUR) 60 MG 24 hr tablet Take 1 tablet (60 mg total) by mouth daily.  30 tablet  6  . metoprolol succinate (TOPROL-XL) 25 MG 24 hr tablet Take 1 tablet (25 mg total) by mouth daily.  30 tablet  11  . promethazine (PHENERGAN) 25 MG tablet Take 1 tablet (25 mg total) by mouth every 6 (six) hours as needed for nausea.  20 tablet  1  . senna (SENOKOT) 8.6 MG TABS Take 4 tablets by mouth daily as needed (constipation).      . temazepam (RESTORIL) 15 MG capsule Take 1 capsule (15 mg total) by mouth at bedtime as needed for sleep.  30 capsule  3  . traMADol (ULTRAM) 50 MG tablet Take 1 tablet (50 mg total) by mouth every 8 (eight) hours as needed for pain.  90 tablet  0   No current facility-administered medications for this visit.    Lab Results:  Results for orders placed in visit on 09/26/13 (from the past 48 hour(s))  GLUCOSE, CAPILLARY     Status: Abnormal   Collection Time    09/26/13  9:36 AM      Result Value Range   Glucose-Capillary 335 (*) 70 - 99 mg/dL    Physical Findings: AIMS:  , ,  ,  ,    CIWA:    COWS:     Treatment Plan Summary:  at this time we will go ahead and increase her Wellbutrin from 300 mg 2 a morning dose of 450 mg. The patient will continue taking Prozac 20 mg. The possibility of increasing Prozac on her next visit is not out of the question. Today we also went ahead and started her on Restoril 15 mg. I strongly urged the patient stayed one-to-one talking treatment. This patient at this time is not  suicidal. She'll return to see me in approximately 2 months.  Plan:  Medical Decision Making Problem Points:  Established problem, worsening (2) Data Points:  Review of medication regiment & side effects (2)  I certify that inpatient services furnished can reasonably be expected to improve  the patient's condition.   Xitlalli Newhard IRVING 09/26/2013, 3:11 PM

## 2013-09-27 ENCOUNTER — Ambulatory Visit (HOSPITAL_COMMUNITY): Payer: Self-pay | Admitting: Licensed Clinical Social Worker

## 2013-09-27 ENCOUNTER — Ambulatory Visit: Payer: Self-pay | Admitting: Physical Medicine & Rehabilitation

## 2013-09-28 NOTE — Addendum Note (Signed)
Addended by: Debe Coder B on: 09/28/2013 11:21 AM   Modules accepted: Level of Service

## 2013-09-28 NOTE — Progress Notes (Signed)
Case discussed with Dr. McLean soon after the resident saw the patient.  We reviewed the resident's history and exam and pertinent patient test results.  I agree with the assessment, diagnosis, and plan of care documented in the resident's note. 

## 2013-10-10 ENCOUNTER — Ambulatory Visit (HOSPITAL_COMMUNITY): Payer: Self-pay | Admitting: Licensed Clinical Social Worker

## 2013-10-11 ENCOUNTER — Telehealth: Payer: Self-pay | Admitting: *Deleted

## 2013-10-11 NOTE — Telephone Encounter (Signed)
Burna Mortimer from [facility] [phone] called about CBG this AM [phone] - rechecked 3PM stated hi. Tried to call pt [phone] and [phone] both about 4:50PM left message clinic called - if any problems with high CBG - to go to ER. Will try pt in AM. Shannen Flansburg RN 10/11/13 4:55PM

## 2013-10-12 ENCOUNTER — Telehealth: Payer: Self-pay | Admitting: *Deleted

## 2013-10-12 NOTE — Telephone Encounter (Signed)
Talked with pt - has appt 10/16/13 with Dr Everardo Beals. Will bring meter at appt. No change with CBG reading. If any problems - suggest to go to ER. [name] Kidney Nellie Pester RN 10/12/13 4:30PM

## 2013-10-14 ENCOUNTER — Encounter (HOSPITAL_COMMUNITY): Payer: Self-pay | Admitting: Emergency Medicine

## 2013-10-14 ENCOUNTER — Observation Stay (HOSPITAL_COMMUNITY)
Admission: EM | Admit: 2013-10-14 | Discharge: 2013-10-15 | Disposition: A | Discharge status: Home / Self Care | Payer: PRIVATE HEALTH INSURANCE | Attending: Internal Medicine | Admitting: Internal Medicine

## 2013-10-14 DIAGNOSIS — M545 Low back pain, unspecified: Secondary | ICD-10-CM | POA: Diagnosis present

## 2013-10-14 DIAGNOSIS — F419 Anxiety disorder, unspecified: Secondary | ICD-10-CM | POA: Diagnosis present

## 2013-10-14 DIAGNOSIS — G609 Hereditary and idiopathic neuropathy, unspecified: Secondary | ICD-10-CM | POA: Diagnosis present

## 2013-10-14 DIAGNOSIS — F32A Depression, unspecified: Secondary | ICD-10-CM | POA: Diagnosis present

## 2013-10-14 DIAGNOSIS — M797 Fibromyalgia: Secondary | ICD-10-CM | POA: Diagnosis present

## 2013-10-14 DIAGNOSIS — Z882 Allergy status to sulfonamides status: Secondary | ICD-10-CM | POA: Insufficient documentation

## 2013-10-14 DIAGNOSIS — I10 Essential (primary) hypertension: Secondary | ICD-10-CM | POA: Diagnosis present

## 2013-10-14 DIAGNOSIS — F332 Major depressive disorder, recurrent severe without psychotic features: Secondary | ICD-10-CM | POA: Diagnosis present

## 2013-10-14 DIAGNOSIS — Z87891 Personal history of nicotine dependence: Secondary | ICD-10-CM | POA: Insufficient documentation

## 2013-10-14 DIAGNOSIS — I5030 Unspecified diastolic (congestive) heart failure: Secondary | ICD-10-CM | POA: Diagnosis present

## 2013-10-14 DIAGNOSIS — F3289 Other specified depressive episodes: Secondary | ICD-10-CM | POA: Insufficient documentation

## 2013-10-14 DIAGNOSIS — G4730 Sleep apnea, unspecified: Secondary | ICD-10-CM | POA: Insufficient documentation

## 2013-10-14 DIAGNOSIS — F411 Generalized anxiety disorder: Secondary | ICD-10-CM | POA: Insufficient documentation

## 2013-10-14 DIAGNOSIS — F329 Major depressive disorder, single episode, unspecified: Secondary | ICD-10-CM | POA: Insufficient documentation

## 2013-10-14 DIAGNOSIS — I2510 Atherosclerotic heart disease of native coronary artery without angina pectoris: Secondary | ICD-10-CM | POA: Diagnosis present

## 2013-10-14 DIAGNOSIS — I129 Hypertensive chronic kidney disease with stage 1 through stage 4 chronic kidney disease, or unspecified chronic kidney disease: Secondary | ICD-10-CM

## 2013-10-14 DIAGNOSIS — R1013 Epigastric pain: Secondary | ICD-10-CM | POA: Insufficient documentation

## 2013-10-14 DIAGNOSIS — K219 Gastro-esophageal reflux disease without esophagitis: Secondary | ICD-10-CM | POA: Insufficient documentation

## 2013-10-14 DIAGNOSIS — R5381 Other malaise: Secondary | ICD-10-CM | POA: Insufficient documentation

## 2013-10-14 DIAGNOSIS — E119 Type 2 diabetes mellitus without complications: Principal | ICD-10-CM

## 2013-10-14 DIAGNOSIS — Z794 Long term (current) use of insulin: Secondary | ICD-10-CM | POA: Insufficient documentation

## 2013-10-14 DIAGNOSIS — E785 Hyperlipidemia, unspecified: Secondary | ICD-10-CM | POA: Diagnosis present

## 2013-10-14 DIAGNOSIS — IMO0002 Reserved for concepts with insufficient information to code with codable children: Secondary | ICD-10-CM | POA: Diagnosis present

## 2013-10-14 DIAGNOSIS — E1165 Type 2 diabetes mellitus with hyperglycemia: Secondary | ICD-10-CM | POA: Diagnosis present

## 2013-10-14 DIAGNOSIS — I509 Heart failure, unspecified: Secondary | ICD-10-CM | POA: Insufficient documentation

## 2013-10-14 DIAGNOSIS — R739 Hyperglycemia, unspecified: Secondary | ICD-10-CM | POA: Diagnosis present

## 2013-10-14 DIAGNOSIS — E538 Deficiency of other specified B group vitamins: Secondary | ICD-10-CM | POA: Diagnosis present

## 2013-10-14 DIAGNOSIS — R42 Dizziness and giddiness: Secondary | ICD-10-CM | POA: Insufficient documentation

## 2013-10-14 DIAGNOSIS — Z880 Allergy status to penicillin: Secondary | ICD-10-CM | POA: Insufficient documentation

## 2013-10-14 LAB — URINE MICROSCOPIC-ADD ON

## 2013-10-14 LAB — COMPREHENSIVE METABOLIC PANEL
ALT: 22 U/L (ref 0–35)
AST: 23 U/L (ref 0–37)
CO2: 22 mEq/L (ref 19–32)
Calcium: 9.9 mg/dL (ref 8.4–10.5)
Potassium: 4.2 mEq/L (ref 3.5–5.1)
Sodium: 120 mEq/L — ABNORMAL LOW (ref 135–145)
Total Protein: 8.9 g/dL — ABNORMAL HIGH (ref 6.0–8.3)

## 2013-10-14 LAB — BASIC METABOLIC PANEL
BUN: 32 mg/dL — ABNORMAL HIGH (ref 6–23)
Calcium: 8.8 mg/dL (ref 8.4–10.5)
GFR calc Af Amer: 36 mL/min — ABNORMAL LOW (ref >90)
GFR calc non Af Amer: 31 mL/min — ABNORMAL LOW (ref >90)
Potassium: 3.5 mEq/L (ref 3.5–5.1)
Sodium: 129 mEq/L — ABNORMAL LOW (ref 135–145)

## 2013-10-14 LAB — POCT I-STAT 3, VENOUS BLOOD GAS (G3P V)
Bicarbonate: 26.4 mEq/L — ABNORMAL HIGH (ref 20.0–24.0)
pCO2, Ven: 40.6 mmHg — ABNORMAL LOW (ref 45.0–50.0)
pH, Ven: 7.421 — ABNORMAL HIGH (ref 7.250–7.300)
pO2, Ven: 57 mmHg — ABNORMAL HIGH (ref 30.0–45.0)

## 2013-10-14 LAB — URINALYSIS, ROUTINE W REFLEX MICROSCOPIC
Bilirubin Urine: NEGATIVE
Glucose, UA: >1000 mg/dL — AB
Hgb urine dipstick: NEGATIVE
Specific Gravity, Urine: 1.02 (ref 1.005–1.030)
pH: 5 (ref 5.0–8.0)

## 2013-10-14 LAB — CBC
MCH: 25.5 pg — ABNORMAL LOW (ref 26.0–34.0)
MCHC: 33.3 g/dL (ref 30.0–36.0)
Platelets: 276 10*3/uL (ref 150–400)
RBC: 4.79 MIL/uL (ref 3.87–5.11)
RDW: 14.9 % (ref 11.5–15.5)

## 2013-10-14 LAB — GLUCOSE, CAPILLARY
Glucose-Capillary: 466 mg/dL — ABNORMAL HIGH (ref 70–99)
Glucose-Capillary: 480 mg/dL — ABNORMAL HIGH (ref 70–99)
Glucose-Capillary: 368 mg/dL — ABNORMAL HIGH (ref 70–99)

## 2013-10-14 LAB — CG4 I-STAT (LACTIC ACID): Lactic Acid, Venous: 1.93 mmol/L (ref 0.5–2.2)

## 2013-10-14 MED ORDER — HEPARIN SODIUM (PORCINE) 5000 UNIT/ML IJ SOLN
5000.0000 [IU] | Freq: Three times a day (TID) | INTRAMUSCULAR | Status: DC
  Administered 2013-10-15 (×2): 5000 [IU] via SUBCUTANEOUS
  Filled 2013-10-14 (×5): qty 1

## 2013-10-14 MED ORDER — FLUOXETINE HCL 20 MG PO CAPS: 20.0000 mg | ORAL_CAPSULE | Freq: Every morning | ORAL | Status: DC

## 2013-10-14 MED ORDER — GABAPENTIN 800 MG PO TABS
800.0000 mg | ORAL_TABLET | Freq: Every day | ORAL | Status: DC
  Filled 2013-10-14: qty 1

## 2013-10-14 MED ORDER — GABAPENTIN 400 MG PO CAPS
800.0000 mg | ORAL_CAPSULE | Freq: Every day | ORAL | Status: DC
  Administered 2013-10-15: 800 mg via ORAL
  Filled 2013-10-14: qty 2

## 2013-10-14 MED ORDER — PROMETHAZINE HCL 25 MG PO TABS: 25.0000 mg | ORAL_TABLET | Freq: Four times a day (QID) | ORAL | Status: DC | PRN

## 2013-10-14 MED ORDER — DEXTROSE-NACL 5-0.45 % IV SOLN: INTRAVENOUS | Status: DC

## 2013-10-14 MED ORDER — ASPIRIN 81 MG PO CHEW
81.0000 mg | CHEWABLE_TABLET | Freq: Every day | ORAL | Status: DC
  Administered 2013-10-15: 81 mg via ORAL
  Filled 2013-10-14: qty 1

## 2013-10-14 MED ORDER — SODIUM CHLORIDE 0.9 % IV BOLUS (SEPSIS)
1000.0000 mL | Freq: Once | INTRAVENOUS | Status: AC
  Administered 2013-10-14: 1000 mL via INTRAVENOUS

## 2013-10-14 MED ORDER — ACETAMINOPHEN 325 MG PO TABS: 325.0000 mg | ORAL_TABLET | Freq: Four times a day (QID) | ORAL | Status: DC | PRN

## 2013-10-14 MED ORDER — SODIUM CHLORIDE 0.9 % IJ SOLN: 3.0000 mL | Freq: Two times a day (BID) | INTRAMUSCULAR | Status: DC

## 2013-10-14 MED ORDER — PANTOPRAZOLE SODIUM 40 MG PO TBEC
40.0000 mg | DELAYED_RELEASE_TABLET | Freq: Every day | ORAL | Status: DC
  Administered 2013-10-15: 40 mg via ORAL
  Filled 2013-10-14: qty 1

## 2013-10-14 MED ORDER — METOPROLOL SUCCINATE ER 25 MG PO TB24
25.0000 mg | ORAL_TABLET | Freq: Every day | ORAL | Status: DC
  Administered 2013-10-15: 25 mg via ORAL
  Filled 2013-10-14: qty 1

## 2013-10-14 MED ORDER — SODIUM CHLORIDE 0.9 % IV SOLN: 250.0000 mL | INTRAVENOUS | Status: DC | PRN

## 2013-10-14 MED ORDER — ISOSORBIDE MONONITRATE ER 60 MG PO TB24
60.0000 mg | ORAL_TABLET | Freq: Every day | ORAL | Status: DC
  Administered 2013-10-15: 60 mg via ORAL
  Filled 2013-10-14: qty 1

## 2013-10-14 MED ORDER — INSULIN GLARGINE 100 UNIT/ML ~~LOC~~ SOLN
30.0000 [IU] | Freq: Every day | SUBCUTANEOUS | Status: DC
  Administered 2013-10-15: 30 [IU] via SUBCUTANEOUS
  Filled 2013-10-14 (×2): qty 0.3

## 2013-10-14 MED ORDER — SODIUM CHLORIDE 0.9 % IV SOLN
INTRAVENOUS | Status: DC
  Administered 2013-10-14: 4.1 [IU]/h via INTRAVENOUS
  Filled 2013-10-14: qty 1

## 2013-10-14 MED ORDER — SODIUM CHLORIDE 0.9 % IV SOLN
1000.0000 mL | INTRAVENOUS | Status: DC
  Administered 2013-10-14: 1000 mL via INTRAVENOUS

## 2013-10-14 MED ORDER — SODIUM CHLORIDE 0.9 % IJ SOLN: 3.0000 mL | INTRAMUSCULAR | Status: DC | PRN

## 2013-10-14 MED ORDER — GABAPENTIN 400 MG PO CAPS
1600.0000 mg | ORAL_CAPSULE | Freq: Every day | ORAL | Status: DC
  Administered 2013-10-15: 1600 mg via ORAL
  Filled 2013-10-14 (×2): qty 4

## 2013-10-14 MED ORDER — TRAMADOL HCL 50 MG PO TABS
50.0000 mg | ORAL_TABLET | Freq: Four times a day (QID) | ORAL | Status: DC
  Administered 2013-10-15 (×2): 50 mg via ORAL
  Filled 2013-10-14 (×2): qty 1

## 2013-10-14 MED ORDER — INSULIN ASPART 100 UNIT/ML ~~LOC~~ SOLN
0.0000 [IU] | SUBCUTANEOUS | Status: DC
  Administered 2013-10-14: 15 [IU] via SUBCUTANEOUS
  Administered 2013-10-15: 8 [IU] via SUBCUTANEOUS
  Administered 2013-10-15: 3 [IU] via SUBCUTANEOUS
  Administered 2013-10-15: 5 [IU] via SUBCUTANEOUS
  Administered 2013-10-15: 3 [IU] via SUBCUTANEOUS

## 2013-10-14 MED ORDER — TEMAZEPAM 15 MG PO CAPS: 15.0000 mg | ORAL_CAPSULE | Freq: Every evening | ORAL | Status: DC | PRN

## 2013-10-14 MED ORDER — DOCUSATE SODIUM 100 MG PO CAPS: 100.0000 mg | ORAL_CAPSULE | Freq: Two times a day (BID) | ORAL | Status: DC | PRN

## 2013-10-14 MED ORDER — SENNA 8.6 MG PO TABS
4.0000 | ORAL_TABLET | Freq: Every day | ORAL | Status: DC | PRN
  Filled 2013-10-14: qty 4

## 2013-10-14 MED ORDER — GABAPENTIN 800 MG PO TABS
1600.0000 mg | ORAL_TABLET | Freq: Every day | ORAL | Status: DC
  Filled 2013-10-14: qty 2

## 2013-10-14 MED ORDER — GABAPENTIN 800 MG PO TABS: 800.0000 mg | ORAL_TABLET | Freq: Two times a day (BID) | ORAL | Status: DC

## 2013-10-14 MED ORDER — FLUOXETINE HCL 20 MG PO CAPS
20.0000 mg | ORAL_CAPSULE | Freq: Two times a day (BID) | ORAL | Status: DC
  Administered 2013-10-15 (×2): 20 mg via ORAL
  Filled 2013-10-14 (×3): qty 1

## 2013-10-14 MED ORDER — BUPROPION HCL ER (XL) 300 MG PO TB24
450.0000 mg | ORAL_TABLET | Freq: Every day | ORAL | Status: DC
  Administered 2013-10-15: 450 mg via ORAL
  Filled 2013-10-14: qty 1

## 2013-10-14 MED ORDER — AMITRIPTYLINE HCL 25 MG PO TABS
25.0000 mg | ORAL_TABLET | Freq: Every day | ORAL | Status: DC
  Administered 2013-10-15: 25 mg via ORAL
  Filled 2013-10-14 (×2): qty 1

## 2013-10-14 NOTE — ED Notes (Signed)
Lab called panic glucose of 682 reported to EDP.

## 2013-10-14 NOTE — H&P (Signed)
Date: 10/14/2013               Patient Name:  [name] MRN: [id number]  DOB: [birth year] Age / Sex: 62 y.o., female   PCP: Belia Heman, MD         Medical Service: Internal Medicine Teaching Service         Attending Physician: Dr. Inez Catalina, MD    First Contact: Dr. Angelina Sheriff, MD Pager: [phone]  Second Contact: Dr. Leonia Reeves, MD Pager: [phone]       After Hours (After 5p/  First Contact Pager: [phone]  weekends / holidays): Second Contact Pager: [phone]   Chief Complaint: Hyperglycemia  History of Present Illness: [name] is a 62yo AAF w/ pmhx of uncontrolled DM c/b gastroparesis (last hgA1C 13.4 on 09/12/13), borderline personality disorder, MDD w/ multiple suicide attempts, fibromyalgia, CKD stage III, HTN and dCHF (grade 1, Echo 06/2013) who comes to ED with a c/o of hyperglycemia >600.  Patient has h/o poorly controlled DM type 2 as well as medication noncompliance w/ hx of other prior similar admissions for hyperglycemia (last admitted 11/24-11/25/2014). Patient reports she has been excessively thirsty w/ increased urinary frequency and decreased energy x 1 week. She has been checking her CBGs TID at home and they have been running 300-400s over the last few weeks. She is able to verbalize her home insulin regimen, which is lantus 55 U nightly and novolog 10U tid with meals; however, she admits to taking her insulin inconsistently. She states that she forgets to take her insulin and sometimes feels too tired to take it. She has a depressed mood, which has been an issue for her since March. She has decreased motivation and interest in daily activities, difficulty sleeping. Patient is followed by [facility] (last seen 09/26/2013) and was recently prescribed Restoril for sleep, which has improved her sleep so that now she is getting about 6hrs per night. She denies SI, HI, but is unsure if she has been hearing a "buzzing" noise that no one else can hear. No visual  hallucinations. Patient has chronic nausea and epigastric abdominal pain since she had surgery for SBO in March 2014, no recent change to her pain. She denies diarrhea, vomiting, CP, SOB, dysuria, F/C, recent URI symptoms.  In the ED, VSS. CMP- Na 120 (corrected for glucose 129.6), K 4.2, bicarb 22, BUN 38, Cr 1.94 (baseline 1.5-1.7), AG 18. CBC wnl. Lactic acid- 1.93. VBG pH 7.42, pCO2 40.6, pO2 57. UA- >1000 glucose. Pt received 1L NS bolus then NS at 125cc/hr thereafter. Also put on an insulin gtt.   Meds:  Current Outpatient Prescriptions  Medication Sig Dispense Refill  . ACCU-CHEK FASTCLIX LANCETS MISC 1 each by Does not apply route 3 (three) times daily before meals. Dx code 250.00  102 each  12  . acetaminophen (TYLENOL) 325 MG tablet Take 325-650 mg by mouth every 6 (six) hours as needed for mild pain or headache.      Marland Kitchen amitriptyline (ELAVIL) 25 MG tablet Take 1 tablet (25 mg total) by mouth at bedtime.  30 tablet  1  . aspirin 81 MG chewable tablet Chew 81 mg by mouth daily.      Marland Kitchen buPROPion (WELLBUTRIN XL) 150 MG 24 hr tablet Take 3 tablets (450 mg total) by mouth daily.  90 tablet  5  . dexlansoprazole (DEXILANT) 60 MG capsule Take 60 mg by mouth daily.        . Docusate Sodium (COLACE  PO) Take 2 tablets by mouth daily as needed (constipation).      Marland Kitchen FLUoxetine (PROZAC) 20 MG capsule Take 1 capsule (20 mg total) by mouth every morning.  30 capsule  5  . furosemide (LASIX) 40 MG tablet Take 1 tablet (40 mg total) by mouth daily.  30 tablet  11  . gabapentin (NEURONTIN) 800 MG tablet Take 800-1,600 mg by mouth 2 (two) times daily. 800 mg in the am and 1600 mg in the evening.      Marland Kitchen glucose blood (ACCU-CHEK SMARTVIEW) test strip Use to check blood sugar before meals dx code 250.00  100 each  12  . glucose monitoring kit (FREESTYLE) monitoring kit 1 each by Does not apply route as needed for other.  1 each  0  . insulin aspart (NOVOLOG FLEXPEN) 100 UNIT/ML SOPN FlexPen Inject 10 Units  into the skin 3 (three) times daily with meals.  5 pen  12  . insulin glargine (LANTUS) 100 UNIT/ML injection Inject 0.55 mLs (55 Units total) into the skin at bedtime.  3 pen  12  . Insulin Pen Needle 31G X 5 MM MISC Box of 100 - uses up to 5 times per day according to CBG  100 each  11  . isosorbide mononitrate (IMDUR) 60 MG 24 hr tablet Take 1 tablet (60 mg total) by mouth daily.  30 tablet  6  . metoprolol succinate (TOPROL-XL) 25 MG 24 hr tablet Take 1 tablet (25 mg total) by mouth daily.  30 tablet  11  . promethazine (PHENERGAN) 25 MG tablet Take 1 tablet (25 mg total) by mouth every 6 (six) hours as needed for nausea.  20 tablet  1  . senna (SENOKOT) 8.6 MG TABS Take 4 tablets by mouth daily as needed (constipation).      . temazepam (RESTORIL) 15 MG capsule Take 1 capsule (15 mg total) by mouth at bedtime as needed for sleep.  30 capsule  3  . traMADol (ULTRAM) 50 MG tablet Take 1 tablet (50 mg total) by mouth every 8 (eight) hours as needed for pain.  90 tablet  0   Allergies: Allergies as of 10/14/2013 - Review Complete 10/14/2013  Allergen Reaction Noted  . Indomethacin Diarrhea 03/11/2011  . Penicillins Hives   . Cimetidine    . Sulfonamide derivatives     Past Medical History  Diagnosis Date  . Hypertension   . Peripheral neuropathy   . Cervicalgia   . Low back pain syndrome   . Hyperlipidemia   . Vitamin D deficiency   . Vitamin B12 deficiency   . Iliotibial band syndrome   . Anemia   . Borderline personality disorder   . Nonorganic psychosis   . GERD (gastroesophageal reflux disease)   . Diabetes mellitus   . Depression   . Diabetic gastroparesis   . Sleep apnea   . CAD (coronary artery disease)     Catherization 11/18/09>nonobstructive CAD , normal LV function, EF 65%  . Health maintenance examination     Colonoscopy 2009> normal; Diabetic eye exam 12/2008. No diabetic retinopathy; Mammogram 11/10> No evidence of malignancy, DXA 03/14/13 : normal  .  Headache(784.0)   . Arthritis   . SBO (small bowel obstruction) 01/09/2013  . Diastolic CHF     Grade I, on Echo 06/2013, EF 55-60%   Past Surgical History  Procedure Laterality Date  . Abdominal hysterectomy    . Hand surgery    . Bowel resection N/A 01/10/2013  Procedure: SMALL BOWEL RESECTION;  Surgeon: Lodema Pilot, DO;  Location: [facility] OR;  Service: General;  Laterality: N/A;  . Lysis of adhesion N/A 01/10/2013    Procedure: LYSIS OF ADHESION;  Surgeon: Lodema Pilot, DO;  Location: [facility] OR;  Service: General;  Laterality: N/A;  . Laparotomy N/A 01/10/2013    Procedure: EXPLORATORY LAPAROTOMY;  Surgeon: Lodema Pilot, DO;  Location: [facility] OR;  Service: General;  Laterality: N/A;   Family History  Problem Relation Age of Onset  . Diabetes Mother   . Hypertension Mother   . Hyperlipidemia Mother   . Arthritis Mother   . Depression Mother   . Heart disease Father   . Diabetes Sister   . Diabetes Brother   . Heart disease Brother   . Heart disease Paternal Grandmother   . Seizures Brother    History   Social History  . Marital Status: Married    Spouse Name: N/A    Number of Children: N/A  . Years of Education: N/A   Occupational History  . Not on file.   Social History Main Topics  . Smoking status: Former Smoker - quit 10 years ago    Types: Cigarettes    Quit date: 10/25/2002  . Smokeless tobacco: Not on file  . Alcohol Use: No  . Drug Use: No  . Sexual Activity: Yes   Other Topics Concern  . Not on file   Social History Narrative  . Relationship with her husband is poor   Review of Systems: General: no fevers, chills, changes in weight, changes in appetite Skin: no rash HEENT: +dry mouth, polydipsia;  no ST, nasal congestion Pulm: no dyspnea, coughing, wheezing CV: no chest pain, palpitations, shortness of breath Abd: see HPI GU: +polyuria; no dysuria, hematuria Ext: no arthralgias, myalgias Neuro: no weakness, numbness, or tingling  Physical Exam: Blood  pressure 124/62, pulse 85, temperature 98.7 F (37.1 C), temperature source Oral, resp. rate 14, weight 196 lb 12.8 oz (89.268 kg), SpO2 95.00%. Weight 08/2013- 204lbs  General: alert, cooperative, and in no apparent distress HEENT: pupils equal round and reactive to light, vision grossly intact, oropharynx clear and non-erythematous; dentures in place; very dry mucous membranes Neck: supple, no JVD or hepatojugular reflux Lungs: clear to ascultation bilaterally, normal work of respiration, no wheezes, rales, ronchi Heart: regular rate and rhythm, no murmurs, gallops, or rubs Abdomen: soft, TTP over epigastrium, no guarding, non-distended, normal bowel sounds Extremities: warm extremities bilaterally; no BLE edema Neurologic: alert & oriented X3, cranial nerves II-XII grossly intact, strength grossly intact, sensation intact to light touch Psych: flat affect w/ depressed mood; slow speech w/ poor effort throughout the interview/exam; thought content and process were normal; ?auditory hallucinations, but no SI/HI/VH.  Lab results: Basic Metabolic Panel:  Recent Labs  40/98/11 1613  NA 120*  K 4.2  CL 80*  CO2 22  GLUCOSE 682*  BUN 38*  CREATININE 1.94*  CALCIUM 9.9   Liver Function Tests:  Recent Labs  10/14/13 1613  AST 23  ALT 22  ALKPHOS 137*  BILITOT 0.5  PROT 8.9*  ALBUMIN 4.0   CBC:  Recent Labs  10/14/13 1613  WBC 8.9  HGB 12.2  HCT 36.6  MCV 76.4*  PLT 276   CBG:  Recent Labs  10/14/13 1557  GLUCAP >600*   Lactic acid 1.93  Other results: EKG: NSR, normal axis and intervals; no ischemic changes or change from prior EKG.  Assessment & Plan by Problem:  # Hyperglycemia  in type 2 diabetes mellitus: CBG 682 on admission. It is most likely due to medication noncompliance. Patient has recently lost control of her blood sugars with A1C increasing from 7.2 on 05/23/13 to 13.4 on 09/12/13. Patient has a history of depression. She looks clinically  depressed today, but answered yes to 3 out of 8 SIG-ECAPS. She is currently living with her husband at home, who is not very supportive to her per patient. Patient has AG of 18 on admission in the setting of hyperglycemia; however DKA seems less likely given normal bicarbonate level, no ketones in urine, no vomiting, no change in chronic abd pain. Patient received 1 L of normal saline bolus and started with insulin drip in ED. Patient is clinically very dry. She is likely to have a large fluid deficit (at least 6L). Given hx of diastolic dysfunction, needs to be carefully rehydrated. - Patient is admitted to SDU by ED - will give another liter of NS  - Increase fluid rate from 125 to 150 cc/h - repeat BMP now and AM - d/c insulin gtt and start Lantus 30 U (home dose 55 U qhs) - SSI- moderate  - hold lasix - Monitor I&O  # CKD (chronic kidney disease) stage 3, GFR 30-59 ml/min: Baseline Cr 1.5-1.7, elevated on admission to 1.94. Likely prerenal etiology due to dehydration from hyperglycemia.   - Will volume resuscitate with IVF cautiously: NS at 150cc/hr  # CAD: Stable. 11/18/09 Cath:circumflex 50%, RCA 60-70%, medical tx, normal LVF. 02/03/10 Nuclear stress test: Normal. Currently patient does not have any chest pain, no shortness of breath or palpitation. EKG on admission has no ischemic change. She is followed by Dr. Walker Shadow cardiology.  - Will obtain trop X 1 to rule out ACS with silent symptoms - Will need follow up with cardiology as outpatient. - continue home meds: ASA 81mg , Imdur, Metoprolol  # Abdominal pain, epigastric: Chronic since March since her SOB and bowel resection, likely multifactorial. Follows with Dr. Matthias Hughs. She had a normal gastric emptying study. Abdominal CT 08/08/13 showed abdominal wall inflammation completed 1 month trial of mobic. Another possibility is intra-abdominal adhesion post surgery. Patient had a mild elevation of lipase in the past.  - Will obtain  Lipase to r/o pancreatitis - Protonix 40mg  daily  # Hypertension: Bp is normal on admission. On Metoprolol 25mg , IMDUR 60 mg and lasix 40 mg qd. -d/c Lasix 40mg  QD, reassess the volume status in AM -continue metoprolol and imdur  # Major depressive disorder: Patient looks clinically depressed. She is on Wellbutrin 150 mg tid, Prozac 20 mg qd and Elavil 25 mg qd which is likely for peripheral neuropathy. - Continue Wellbutrin and amitriptyline - will increased  Prozac from 20 mg qd to bid [facility] provider noted this could be increased if needed at her last visit 12/3). Will need to monitor carefully, since pt is also on Wellbutrin and amitriptyline.   # Diastolic CHF: Last echo 07/06/13 showed EF 55-60% with grade 1 diastolic dysfunction. Patient is clinically dry. Her body weight decreased from 200lbs on 09/17/13 to 196lbs today.  - Hydrate with IVF cautiously.  # Hyperlipidemia: LDL was 138 on 07/19/13. Her goal is less than 100.  -Need to start statin at d/c  Dispo: Disposition is deferred at this time, awaiting improvement of current medical problems. Anticipated discharge in approximately 1 day(s).   The patient does have a current PCP (Neema Davina Poke, MD) and does need an [facility] follow-up appointment after  discharge.  The patient does not have transportation limitations that hinder transportation to clinic appointments.  Signed: Coolidge Breeze Internal Medicine, PGY1 Pager [phone] 10/14/2013, 8:42 PM

## 2013-10-14 NOTE — ED Notes (Signed)
Pt is aware we need a urine sample. 

## 2013-10-14 NOTE — ED Notes (Addendum)
Pt states she checked her blood sugar today and her machine said "hi".  C/o abdominal pain and nausea, but this is chronic.  Denies changes in bowel/bladder habits.  Pt c/o weakness/dizziness, decreased concentration and blurred vision.  CBG meter reads "hi" in triage.

## 2013-10-14 NOTE — ED Notes (Signed)
Old and New EKG given to Dr Nehemiah Massed

## 2013-10-14 NOTE — ED Notes (Signed)
PT reports taking 10units of novalog before each main meal, 3x daily. PT took first dose of 10 units around 0900 today and again at 1500 today. PT also reports that she takes 55units lantus at bedtime

## 2013-10-14 NOTE — ED Notes (Signed)
CBG 466 

## 2013-10-14 NOTE — ED Provider Notes (Signed)
CSN: [id number]     Arrival date & time 10/14/13  1547 History   First MD Initiated Contact with Patient 10/14/13 1603     Chief Complaint  Patient presents with  . Hyperglycemia  . Abdominal Pain   (Consider location/radiation/quality/duration/timing/severity/associated sxs/prior Treatment) Patient is a 62 y.o. female presenting with hyperglycemia and abdominal pain. The history is provided by the patient.  Hyperglycemia Blood sugar level PTA:  >600 Severity:  Severe Onset quality:  Gradual Timing:  Constant Progression:  Worsening Chronicity:  Recurrent Diabetes status:  Controlled with insulin Current diabetic therapy:  Lantus 55U at night, Novolog TID Context: noncompliance   Relieved by:  Nothing Ineffective treatments:  None tried Associated symptoms: dizziness and weakness (general)   Associated symptoms: no abdominal pain, no fever, no shortness of breath and no vomiting   Abdominal Pain Associated symptoms: no cough, no diarrhea, no fever, no shortness of breath and no vomiting     Past Medical History  Diagnosis Date  . Hypertension   . Peripheral neuropathy   . Cervicalgia   . Low back pain syndrome   . Hyperlipidemia   . Vitamin D deficiency   . Vitamin B12 deficiency   . Iliotibial band syndrome   . Anemia   . Borderline personality disorder   . Nonorganic psychosis   . GERD (gastroesophageal reflux disease)   . Diabetes mellitus   . Depression   . Diabetic gastroparesis   . Sleep apnea   . CAD (coronary artery disease)     Catherization 11/18/09>nonobstructive CAD , normal LV function, EF 65%  . Health maintenance examination     Colonoscopy 2009> normal; Diabetic eye exam 12/2008. No diabetic retinopathy; Mammogram 11/10> No evidence of malignancy, DXA 03/14/13 : normal  . Headache(784.0)   . Arthritis   . SBO (small bowel obstruction) 01/09/2013  . Diastolic CHF     Grade I, on Echo 06/2013, EF 55-60%   Past Surgical History  Procedure Laterality  Date  . Abdominal hysterectomy    . Hand surgery    . Bowel resection N/A 01/10/2013    Procedure: SMALL BOWEL RESECTION;  Surgeon: Lodema Pilot, DO;  Location: [facility] OR;  Service: General;  Laterality: N/A;  . Lysis of adhesion N/A 01/10/2013    Procedure: LYSIS OF ADHESION;  Surgeon: Lodema Pilot, DO;  Location: [facility] OR;  Service: General;  Laterality: N/A;  . Laparotomy N/A 01/10/2013    Procedure: EXPLORATORY LAPAROTOMY;  Surgeon: Lodema Pilot, DO;  Location: [facility] OR;  Service: General;  Laterality: N/A;   Family History  Problem Relation Age of Onset  . Diabetes Mother   . Hypertension Mother   . Hyperlipidemia Mother   . Arthritis Mother   . Depression Mother   . Heart disease Father   . Diabetes Sister   . Diabetes Brother   . Heart disease Brother   . Heart disease Paternal Grandmother   . Seizures Brother    History  Substance Use Topics  . Smoking status: Former Smoker    Types: Cigarettes    Quit date: 10/25/2002  . Smokeless tobacco: Not on file  . Alcohol Use: No   OB History   Grav Para Term Preterm Abortions TAB SAB Ect Mult Living                 Review of Systems  Constitutional: Negative for fever.  Respiratory: Negative for cough and shortness of breath.   Gastrointestinal: Negative for vomiting, abdominal pain and diarrhea.  Neurological: Positive for dizziness.  All other systems reviewed and are negative.    Allergies  Indomethacin; Penicillins; Cimetidine; and Sulfonamide derivatives  Home Medications   Current Outpatient Rx  Name  Route  Sig  Dispense  Refill  . ACCU-CHEK FASTCLIX LANCETS MISC   Does not apply   1 each by Does not apply route 3 (three) times daily before meals. Dx code 250.00   102 each   12   . amitriptyline (ELAVIL) 25 MG tablet   Oral   Take 1 tablet (25 mg total) by mouth at bedtime.   30 tablet   1   . aspirin 81 MG chewable tablet   Oral   Chew 81 mg by mouth daily.         Marland Kitchen buPROPion (WELLBUTRIN XL) 150 MG  24 hr tablet   Oral   Take 3 tablets (450 mg total) by mouth daily.   90 tablet   5   . dexlansoprazole (DEXILANT) 60 MG capsule   Oral   Take 60 mg by mouth daily.           Tery Sanfilippo Sodium (COLACE PO)   Oral   Take 2 tablets by mouth daily as needed (constipation).         Marland Kitchen FLUoxetine (PROZAC) 20 MG capsule   Oral   Take 1 capsule (20 mg total) by mouth every morning.   30 capsule   5   . furosemide (LASIX) 40 MG tablet   Oral   Take 1 tablet (40 mg total) by mouth daily.   30 tablet   11   . gabapentin (NEURONTIN) 800 MG tablet   Oral   Take 800-1,600 mg by mouth 2 (two) times daily. 800 mg in the am and 1600 mg in the evening.         Marland Kitchen glucose blood (ACCU-CHEK SMARTVIEW) test strip      Use to check blood sugar before meals dx code 250.00   100 each   12   . glucose monitoring kit (FREESTYLE) monitoring kit   Does not apply   1 each by Does not apply route as needed for other.   1 each   0   . insulin aspart (NOVOLOG FLEXPEN) 100 UNIT/ML SOPN FlexPen   Subcutaneous   Inject 10 Units into the skin 3 (three) times daily with meals.   5 pen   12   . insulin glargine (LANTUS) 100 UNIT/ML injection   Subcutaneous   Inject 0.55 mLs (55 Units total) into the skin at bedtime.   3 pen   12   . Insulin Pen Needle 31G X 5 MM MISC      Box of 100 - uses up to 5 times per day according to CBG   100 each   11   . isosorbide mononitrate (IMDUR) 60 MG 24 hr tablet   Oral   Take 1 tablet (60 mg total) by mouth daily.   30 tablet   6   . metoprolol succinate (TOPROL-XL) 25 MG 24 hr tablet   Oral   Take 1 tablet (25 mg total) by mouth daily.   30 tablet   11   . promethazine (PHENERGAN) 25 MG tablet   Oral   Take 1 tablet (25 mg total) by mouth every 6 (six) hours as needed for nausea.   20 tablet   1   . senna (SENOKOT) 8.6 MG TABS   Oral   Take  4 tablets by mouth daily as needed (constipation).         . temazepam (RESTORIL) 15 MG  capsule   Oral   Take 1 capsule (15 mg total) by mouth at bedtime as needed for sleep.   30 capsule   3   . traMADol (ULTRAM) 50 MG tablet   Oral   Take 1 tablet (50 mg total) by mouth every 8 (eight) hours as needed for pain.   90 tablet   0    BP 138/90  Pulse 91  Temp(Src) 98.7 F (37.1 C) (Oral)  Resp 24  Wt 196 lb 12.8 oz (89.268 kg)  SpO2 97% Physical Exam  Nursing note and vitals reviewed. Constitutional: She is oriented to person, place, and time. She appears well-developed and well-nourished. No distress.  HENT:  Head: Normocephalic and atraumatic.  Eyes: EOM are normal. Pupils are equal, round, and reactive to light.  Neck: Normal range of motion. Neck supple.  Cardiovascular: Normal rate and regular rhythm.  Exam reveals no friction rub.   No murmur heard. Pulmonary/Chest: Effort normal and breath sounds normal. No respiratory distress. She has no wheezes. She has no rales.  Abdominal: Soft. She exhibits no distension. There is tenderness (central, mild). There is no rebound.  Musculoskeletal: Normal range of motion. She exhibits no edema.  Neurological: She is alert and oriented to person, place, and time. No cranial nerve deficit. She exhibits normal muscle tone. Coordination normal.  Sleepy, answers questions appropriately  Skin: No rash noted. She is not diaphoretic.    ED Course  Procedures (including critical care time) Labs Review Labs Reviewed  GLUCOSE, CAPILLARY - Abnormal; Notable for the following:    Glucose-Capillary >600 (*)    All other components within normal limits  CBC  COMPREHENSIVE METABOLIC PANEL  URINALYSIS, ROUTINE W REFLEX MICROSCOPIC  BLOOD GAS, VENOUS   Imaging Review No results found.  EKG Interpretation    Date/Time:  Sunday October 14 2013 16:29:46 EST Ventricular Rate:  89 PR Interval:  149 QRS Duration: 93 QT Interval:  372 QTC Calculation: 453 R Axis:   56 Text Interpretation:  Sinus rhythm Confirmed by Gwendolyn Grant   MD, Janicia Monterrosa ([id number]) on 10/14/2013 5:26:10 PM            MDM   1. Hyperglycemia    38F presents with hyperglycemia. Patient was admitted here for hypoglycemia about a month ago. He takes Lantus at night and NovoLog throughout the day. She was her Lantus increased from 50-55 units at night. She reports she forgets to take all of her insulin and doesn't check her blood sugar daily. She's had high readings on her glucometer at home for the past several days. Here, she had stable vitals. She hasn't had any nausea or vomiting. She has some central abdominal pain exam without rebound or guarding. He states it's been going on for the past 9 months. She denies any urinary problems. She is sleepy, but answers questions and is oriented. With her hyperglycemia, mild sleepiness I am concerned about hyperglycemic nonketotic state. I will begin with fluid resuscitation and check labs. Labs show glucose of 682, hyponatremia at 120, elevated BUN at 38. Glucose Stabilizer initiated, insulin drip initiated. Internal medicine teaching service consulted and admitting.    Dagmar Hait, MD 10/15/13 [phone]

## 2013-10-14 NOTE — ED Notes (Signed)
CBG was elevated. Informed RN.

## 2013-10-15 LAB — BASIC METABOLIC PANEL
CO2: 26 mEq/L (ref 19–32)
Calcium: 8.7 mg/dL (ref 8.4–10.5)
Creatinine, Ser: 1.35 mg/dL — ABNORMAL HIGH (ref 0.50–1.10)
GFR calc non Af Amer: 41 mL/min — ABNORMAL LOW (ref >90)
Potassium: 3.4 mEq/L — ABNORMAL LOW (ref 3.5–5.1)
Sodium: 137 mEq/L (ref 135–145)

## 2013-10-15 LAB — MRSA PCR SCREENING: MRSA by PCR: NEGATIVE

## 2013-10-15 LAB — GLUCOSE, CAPILLARY: Glucose-Capillary: 159 mg/dL — ABNORMAL HIGH (ref 70–99)

## 2013-10-15 MED ORDER — POTASSIUM CHLORIDE CRYS ER 20 MEQ PO TBCR
20.0000 meq | EXTENDED_RELEASE_TABLET | Freq: Once | ORAL | Status: AC
  Administered 2013-10-15: 20 meq via ORAL
  Filled 2013-10-15: qty 1

## 2013-10-15 MED ORDER — SODIUM CHLORIDE 0.9 % IV SOLN: 1000.0000 mL | INTRAVENOUS | Status: DC

## 2013-10-15 NOTE — Progress Notes (Signed)
Subjective:  [name] is a 62 y.o. woman presenting with hyperglycemia.  Blood sugars were well controlled overnight on home insulin regimen. NAE. Ready for discharge.  Objective: Vital signs in last 24 hours: Filed Vitals:   10/14/13 1700 10/14/13 1900 10/14/13 2352 10/15/13 0447  BP: 124/62 130/70 132/65 124/63  Pulse: 85 81 70 68  Temp:   98 F (36.7 C) 98.2 F (36.8 C)  TempSrc:   Oral Oral  Resp: 14 15 10 10   Height:   5\' 1"  (1.549 m)   Weight:   192 lb 3.9 oz (87.2 kg) 192 lb 0.3 oz (87.1 kg)  SpO2: 95% 94% 97% 96%   Weight change:   Intake/Output Summary (Last 24 hours) at 10/15/13 0716 Last data filed at 10/15/13 0000  Gross per 24 hour  Intake    155 ml  Output   1525 ml  Net  -1370 ml   General: alert, cooperative, and in no apparent distress HEENT: pupils equal round and reactive to light, vision grossly intact, oropharynx clear and non-erythematous; dentures in place; moist mucous mebranes Neck: supple, no JVD  Lungs: clear to ascultation bilaterally, normal work of respiration, no wheezes, rales, ronchi Heart: regular rate and rhythm, no murmurs, gallops, or rubs Abdomen: soft, TTP over epigastrium, no guarding, non-distended, normal bowel sounds  Extremities: warm extremities bilaterally; no BLE edema  Neurologic: alert & oriented X3, cranial nerves II-XII grossly intact, strength grossly intact, sensation intact to light touch  Psych: flat affect w/ depressed mood; slow speech w/ poor effort throughout the interview/exam; thought content and process were normal; NO SI/HI/ Patient states that she is safe to go home.   Lab Results: Basic Metabolic Panel:  Recent Labs Lab 10/14/13 2225 10/15/13 0435  NA 129* 137  K 3.5 3.4*  CL 92* 101  CO2 27 26  GLUCOSE 337* 163*  BUN 32* 25*  CREATININE 1.69* 1.35*  CALCIUM 8.8 8.7   Liver Function Tests:  Recent Labs Lab 10/14/13 1613  AST 23  ALT 22  ALKPHOS 137*  BILITOT 0.5  PROT 8.9*    ALBUMIN 4.0    Recent Labs Lab 10/14/13 2225  LIPASE 47   CBC:  Recent Labs Lab 10/14/13 1613  WBC 8.9  HGB 12.2  HCT 36.6  MCV 76.4*  PLT 276   Cardiac Enzymes:  Recent Labs Lab 10/14/13 2225  TROPONINI <0.30   CBG:  Recent Labs Lab 10/14/13 1557 10/14/13 1903 10/14/13 1958 10/14/13 2113 10/14/13 2350 10/15/13 0446  GLUCAP >600* 466* 480* 368* 280* 159*   Urinalysis:  Recent Labs Lab 10/14/13 1916  COLORURINE YELLOW  LABSPEC 1.020  PHURINE 5.0  GLUCOSEU >1000*  HGBUR NEGATIVE  BILIRUBINUR NEGATIVE  KETONESUR NEGATIVE  PROTEINUR NEGATIVE  UROBILINOGEN 0.2  NITRITE NEGATIVE  LEUKOCYTESUR NEGATIVE   Micro Results: Recent Results (from the past 240 hour(s))  MRSA PCR SCREENING     Status: None   Collection Time    10/15/13 12:43 AM      Result Value Range Status   MRSA by PCR NEGATIVE  NEGATIVE Final   Comment:            The GeneXpert MRSA Assay (FDA     approved for NASAL specimens     only), is one component of a     comprehensive MRSA colonization     surveillance program. It is not     intended to diagnose MRSA     infection nor to guide  or     monitor treatment for     MRSA infections.   Studies/Results: No results found. Medications: I have reviewed the patient's current medications. Scheduled Meds: . amitriptyline  25 mg Oral QHS  . aspirin  81 mg Oral Daily  . buPROPion  450 mg Oral Daily  . FLUoxetine  20 mg Oral BID  . gabapentin  1,600 mg Oral QHS  . gabapentin  800 mg Oral Daily  . heparin  5,000 Units Subcutaneous Q8H  . insulin aspart  0-15 Units Subcutaneous Q4H  . insulin glargine  30 Units Subcutaneous QHS  . isosorbide mononitrate  60 mg Oral Daily  . metoprolol succinate  25 mg Oral Daily  . pantoprazole  40 mg Oral Daily  . sodium chloride  3 mL Intravenous Q12H  . sodium chloride  3 mL Intravenous Q12H  . traMADol  50 mg Oral Q6H   Continuous Infusions: . sodium chloride 1,000 mL (10/15/13 0030)   PRN  Meds:.sodium chloride, acetaminophen, docusate sodium, promethazine, senna, sodium chloride, temazepam Assessment/Plan: Principal Problem:   Hyperglycemia Active Problems:   Uncontrolled type 2 diabetes mellitus with insulin therapy   HYPERLIPIDEMIA   PERIPHERAL NEUROPATHY   HYPERTENSION   CAD   Anxiety   Depression   Fibromyalgia syndrome   Diastolic CHF  # Hyperglycemia in type 2 diabetes mellitus: CBG 682 on admission. It is most likely due to medication noncompliance. Patient has recently lost control of her blood sugars with A1C increasing from 7.2 on 05/23/13 to 13.4 on 09/12/13. Patient has a history of depression. She endorses depression today, answering yes to 3 out of 8 SIG-ECAPS. She is currently living with her husband at home, who is not very supportive to her per patient. Patient has AG of 18 on admission in the setting of hyperglycemia; however DKA seems less likely given normal bicarbonate level, no ketones in urine, no vomiting, no change in chronic abd pain. Patient received 1 L of normal saline bolus and started with insulin drip in ED. Patient is clinically very dry. She is likely to have a large fluid deficit (at least 6L). Given hx of diastolic dysfunction, needs to be carefully rehydrated. Patients CBG normalized overnight and the AG closed. She is tolerating food without problems and has a clinic appointment for tomorrow. I recommend that she is discharge on her home meds. The patient states that she will use her insulin and go to the appointment tomorrow.  # CKD (chronic kidney disease) stage 3, GFR 30-59 ml/min: Baseline Cr 1.5-1.7, elevated on admission to 1.94. Likely prerenal etiology due to dehydration from hyperglycemia.  - Will volume resuscitate with IVF: NS at 150cc/hr  - Appears resolved as Cr has normalized.  # CAD: Stable.  11/18/09 Cath:circumflex 50%, RCA 60-70%, medical tx, normal LVF. 02/03/10 Nuclear stress test: Normal. Currently patient does not have any  chest pain, no shortness of breath or palpitation. EKG on admission has no ischemic change. She is followed by Dr. Walker Shadow cardiology. Trop was negative x 1. I recommended continuing home meds (ASA 81mg , Imdur, Metoprolol).  # Abdominal pain, epigastric: Chronic since March since her SOB and bowel resection, likely multifactorial. Follows with Dr. Matthias Hughs. She had a normal gastric emptying study. Abdominal CT 08/08/13 showed abdominal wall inflammation completed 1 month trial of mobic. Another possibility is intra-abdominal adhesion post surgery. Patient had a mild elevation of lipase in the past, but it is normal today. - Protonix 40mg  daily   # Hypertension:  Bp is normal on admission. On Metoprolol 25mg , IMDUR 60 mg and lasix 40 mg qd.  -d/c Lasix 40mg  QD, reassess the volume status at outpatient f/u -continue metoprolol and imdur   # Major depressive disorder: Patient looks clinically depressed. She is on Wellbutrin 150 mg tid, Prozac 20 mg qd and Elavil 25 mg qd. Patient does not endorse SI/HI at this time. - Continue Wellbutrin and amitriptyline  - On admission Prozac was increased from 20 mg qd to bid [facility] provider noted this could be increased if needed at her last visit 12/3). Patient needs outpatient management of this problem. Patients psychiatrist is Dr. Donell Beers.  # Diastolic CHF: Last echo 07/06/13 showed EF 55-60% with grade 1 diastolic dysfunction. Patient is clinically dry. Her body weight decreased from 200lbs on 09/17/13 to 196lbs at admission. She responded well to IVFs.  # Hyperlipidemia: LDL was 138 on 07/19/13. Her goal is less than 100.  -Need to start statin at d/c   Dispo: Disposition is deferred at this time, awaiting improvement of current medical problems.  Anticipated discharge today.   The patient does have a current PCP (Neema Davina Poke, MD) and does need an [facility] hospital follow-up appointment after discharge.  The patient does not have transportation  limitations that hinder transportation to clinic appointments.  .Services Needed at time of discharge: Y = Yes, Blank = No PT:   OT:   RN:   Equipment:   Other:     LOS: 1 day   Pleas Koch, MD 10/15/2013, 7:16 AM

## 2013-10-15 NOTE — Progress Notes (Addendum)
Inpatient Diabetes Program Recommendations  AACE/ADA: New Consensus Statement on Inpatient Glycemic Control (2013)  Target Ranges:  Prepandial:   less than 140 mg/dL      Peak postprandial:   less than 180 mg/dL (1-2 hours)      Critically ill patients:  140 - 180 mg/dL     Results for [name], [name] (MRN [id number]) as of 10/15/2013 08:58  Ref. Range 10/14/2013 15:57 10/14/2013 19:03 10/14/2013 19:58 10/14/2013 21:13  Glucose-Capillary Latest Range: 70-99 mg/dL >782 (HH) 956 (H) 213 (H) 368 (H)    Results for [name], [name] (MRN [id number]) as of 10/15/2013 08:58  Ref. Range 10/14/2013 23:50 10/15/2013 04:46 10/15/2013 07:49  Glucose-Capillary Latest Range: 70-99 mg/dL 629 (H) 528 (H) 413 (H)    **Admitted with hyperglycemia.  Glucose >600 mg/dl on admit.  Home insulin includes:  Lantus 55 units QHS + Novolog 10 units tid with meals.  **Patient admits to non-adherence with insulin regimen at home.  Has issues with depression and other psychiatric issues.    **Per records, patient had extensive visit with Norm Parcel (Diabetes Educator with the Internal Medicine clinic) on 09/26/13.    **Noted patient received 30 units Lantus at midnight.  Currently on Novolog Moderate correction scale (SSI) Q4 hours as well.  CBGs trending downward today.   Will follow. Ambrose Finland RN, MSN, CDE Diabetes Coordinator Inpatient Diabetes Program Team Pager: [phone] (8a-10p)

## 2013-10-15 NOTE — H&P (Signed)
  Date: 10/15/2013  Patient name: [name]  Medical record number: [id number]  Date of birth: 11/06/1950   I have seen and evaluated Dyke Maes and discussed their care with the Residency Team.  Ms. Swantek is a 62yo woman with PMH of uncontrolled DM, gastroparesis, MDD with multiple Suicide attempts in the past, FM, CKD3, HTN, dCHF who presents for hyperglycemia and dehydration.  Further symptoms include increased thirst and polyuria along with decreased energy.  She has been checking her sugars, but has not been taking her insulin consistently.  The management of her diabetes is complicated by MDD for which she is treated at [facility].  She reports to me some mild abdominal pain, but no vomiting, diarrhea or nausea.  In ED she was noted to have a low normal bicarb, Na of 120 which corrected to 129, AG of 18, pH on VBG of 7.42, Glu > 600, Cr of 1.94 (above baseline).  She received fluids and was started on an insulin drip.  On exam she had dry MM and mild TTP over epigastrium.  Somewhat flattened affect.    Assessment and Plan: I have seen and evaluated the patient as outlined above. I agree with the formulated Assessment and Plan as detailed in the residents' admission note, with the following changes:   1. Hyperglycemia with possible mild DKA: Noted to have an AG but normal pH and bicarb.  She received initially an insulin drip but was quickly transitioned to SQ insulin given her improvement.  She was placed in the SDU for close monitoring with frequent BMPs and fluids planned.  She was also placed on SSI and I/O's monitored.  Anticipate a quick turn around.  She will need counseling concerning daily compliance with insulin.  Will also closely monitor potassium.   2. MDD: mood is likely playing a part in her noncompliance as she has recently been diagnosed with a major depressive episode and is being followed at [facility].  The resident team increased her prozac which I think is appropriate.  She will need good  follow up with psychiatry.  No SI/HI.   3. Acute on CKD 3: Cr is up to 1.9 from a baseline around 1.4-1.5.  Will give IVF and monitor closely with BMPs as noted above.  Anticipate related to dehydration from prolonged hyperglycemia.   Other issues are discussed in Dr. Hollace Kinnier note.   Inez Catalina, MD 12/22/20148:42 AM

## 2013-10-15 NOTE — Progress Notes (Signed)
Pt and husband given discharge instructions, both provided verbal understanding.  Pt signed forms.  As follow up appointment in the morning.  PIV removed.

## 2013-10-15 NOTE — Discharge Summary (Signed)
Name: [name] MRN: [id number] DOB: 1951/05/21 62 y.o. PCP: Belia Heman, MD  Date of Admission: 10/14/2013  4:02 PM Date of Discharge: 10/15/2013 Attending Physician: Inez Catalina, MD  Discharge Diagnosis:  Principal Problem:   Hyperglycemia Active Problems:   Uncontrolled type 2 diabetes mellitus with insulin therapy   HYPERLIPIDEMIA   PERIPHERAL NEUROPATHY   HYPERTENSION   CAD   Anxiety   Depression   Fibromyalgia syndrome   Diastolic CHF  Discharge Medications:   Medication List         ACCU-CHEK FASTCLIX LANCETS Misc  1 each by Does not apply route 3 (three) times daily before meals. Dx code 250.00     acetaminophen 325 MG tablet  Commonly known as:  TYLENOL  Take 325-650 mg by mouth every 6 (six) hours as needed for mild pain or headache.     amitriptyline 25 MG tablet  Commonly known as:  ELAVIL  Take 1 tablet (25 mg total) by mouth at bedtime.     aspirin 81 MG chewable tablet  Chew 81 mg by mouth daily.     buPROPion 150 MG 24 hr tablet  Commonly known as:  WELLBUTRIN XL  Take 3 tablets (450 mg total) by mouth daily.     COLACE PO  Take 2 tablets by mouth daily as needed (constipation).     DEXILANT 60 MG capsule  Generic drug:  dexlansoprazole  Take 60 mg by mouth daily.     FLUoxetine 20 MG capsule  Commonly known as:  PROZAC  Take 1 capsule (20 mg total) by mouth every morning.     furosemide 40 MG tablet  Commonly known as:  LASIX  Take 1 tablet (40 mg total) by mouth daily.     gabapentin 800 MG tablet  Commonly known as:  NEURONTIN  Take 800-1,600 mg by mouth 2 (two) times daily. 800 mg in the am and 1600 mg in the evening.     glucose blood test strip  Commonly known as:  ACCU-CHEK SMARTVIEW  Use to check blood sugar before meals dx code 250.00     glucose monitoring kit monitoring kit  1 each by Does not apply route as needed for other.     insulin aspart 100 UNIT/ML Sopn FlexPen  Commonly known as:  NOVOLOG  FLEXPEN  Inject 10 Units into the skin 3 (three) times daily with meals.     insulin glargine 100 UNIT/ML injection  Commonly known as:  LANTUS  Inject 0.55 mLs (55 Units total) into the skin at bedtime.     Insulin Pen Needle 31G X 5 MM Misc  Box of 100 - uses up to 5 times per day according to CBG     isosorbide mononitrate 60 MG 24 hr tablet  Commonly known as:  IMDUR  Take 1 tablet (60 mg total) by mouth daily.     metoprolol succinate 25 MG 24 hr tablet  Commonly known as:  TOPROL-XL  Take 1 tablet (25 mg total) by mouth daily.     promethazine 25 MG tablet  Commonly known as:  PHENERGAN  Take 1 tablet (25 mg total) by mouth every 6 (six) hours as needed for nausea.     senna 8.6 MG Tabs tablet  Commonly known as:  SENOKOT  Take 4 tablets by mouth daily as needed (constipation).     temazepam 15 MG capsule  Commonly known as:  RESTORIL  Take 1 capsule (15 mg total)  by mouth at bedtime as needed for sleep.     traMADol 50 MG tablet  Commonly known as:  ULTRAM  Take 1 tablet (50 mg total) by mouth every 8 (eight) hours as needed for pain.        Disposition and follow-up:   Ms.Ionna Meigs was discharged from [facility] in Stable condition.  At the hospital follow up visit please address:  1.  MDD, DM, Medication Compliance, hypokalemia, CAD (may need to start statin)  2.  Labs / imaging needed at time of follow-up: BMP  3.  Pending labs/ test needing follow-up: None  Follow-up Appointments:     Follow-up Information   Follow up with Stacy Gardner, MD On 10/16/2013. (at 9:30 AM, bring all your medications and your glucose meter with you)    Specialty:  Internal Medicine   Contact information:   [address] [place] [address] [phone]       Discharge Instructions: Discharge Orders   Future Appointments Provider Department Dept Phone   10/16/2013 9:45 AM Belia Heman, MD [facility] [phone]    10/29/2013 1:30 PM Baird Cancer, RD [facility] [phone]   01/16/2014 1:15 PM Belia Heman, MD [facility] [phone]   Future Orders Complete By Expires   Call MD for:  difficulty breathing, headache or visual disturbances  As directed    Call MD for:  persistant dizziness or light-headedness  As directed    Call MD for:  severe uncontrolled pain  As directed    Diet - low sodium heart healthy  As directed    Discharge instructions  As directed    Comments:     You have been diagnosed with elevated blood sugar because you were unable to take your insulin. Please take your insulin as prescribed. Please follow up in clinic tomorrow.   Increase activity slowly  As directed       Consultations:    Procedures Performed:  No results found.   Admission HPI: [name] is a 62yo AAF w/ pmhx of uncontrolled DM c/b gastroparesis (last hgA1C 13.4 on 09/12/13), borderline personality disorder, MDD w/ multiple suicide attempts, fibromyalgia, CKD stage III, HTN and dCHF (grade 1, Echo 06/2013) who comes to ED with a c/o of hyperglycemia >600.  Patient has h/o poorly controlled DM type 2 as well as medication noncompliance w/ hx of other prior similar admissions for hyperglycemia (last admitted 11/24-11/25/2014). Patient reports she has been excessively thirsty w/ increased urinary frequency and decreased energy x 1 week. She has been checking her CBGs TID at home and they have been running 300-400s over the last few weeks. She is able to verbalize her home insulin regimen, which is lantus 55 U nightly and novolog 10U tid with meals; however, she admits to taking her insulin inconsistently. She states that she forgets to take her insulin and sometimes feels too tired to take it. She has a depressed mood, which has been an issue for her since March. She has decreased motivation and interest in daily activities, difficulty sleeping. Patient is followed by  [facility] (last seen 09/26/2013) and was recently prescribed Restoril for sleep, which has improved her sleep so that now she is getting about 6hrs per night. She denies SI, HI, but is unsure if she has been hearing a "buzzing" noise that no one else can hear. No visual hallucinations. Patient has chronic nausea and epigastric abdominal  pain since she had surgery for SBO in March 2014, no recent change to her pain. She denies diarrhea, vomiting, CP, SOB, dysuria, F/C, recent URI symptoms.  In the ED, VSS. CMP- Na 120 (corrected for glucose 129.6), K 4.2, bicarb 22, BUN 38, Cr 1.94 (baseline 1.5-1.7), AG 18. CBC wnl. Lactic acid- 1.93. VBG pH 7.42, pCO2 40.6, pO2 57. UA- >1000 glucose. Pt received 1L NS bolus then NS at 125cc/hr thereafter. Also put on an insulin gtt.    Hospital Course by problem list: Principal Problem:   Hyperglycemia Active Problems:   Uncontrolled type 2 diabetes mellitus with insulin therapy   HYPERLIPIDEMIA   PERIPHERAL NEUROPATHY   HYPERTENSION   CAD   Anxiety   Depression   Fibromyalgia syndrome   Diastolic CHF  # Hyperglycemia in type 2 diabetes mellitus: CBG 682 on admission. It is most likely due to medication noncompliance. Patient has recently lost control of her blood sugars with A1C increasing from 7.2 on 05/23/13 to 13.4 on 09/12/13. Patient has a history of depression. She endorses depression today, answering yes to 3 out of 8 SIG-ECAPS. She is currently living with her husband at home, who is not very supportive to her per patient. Patient has AG of 18 on admission in the setting of hyperglycemia; however DKA seems less likely given normal bicarbonate level, no ketones in urine, no vomiting, no change in chronic abd pain. Patient received 1 L of normal saline bolus and started with insulin drip in ED. Patient is clinically very dry. She is likely to have a large fluid deficit (at least 6L). Given hx of diastolic dysfunction, needs to be carefully rehydrated. Patients CBG  normalized overnight and the AG closed. She is tolerating food without problems and has a clinic appointment for tomorrow. I recommend that she is discharge on her home meds. The patient states that she will use her insulin and go to the appointment tomorrow.   # CKD (chronic kidney disease) stage 3, GFR 30-59 ml/min: Baseline Cr 1.5-1.7, elevated on admission to 1.94. Likely prerenal etiology due to dehydration from hyperglycemia. Appears resolved as Cr has normalized with rehydration.   # CAD: Stable.  11/18/09 Cath:circumflex 50%, RCA 60-70%, medical tx, normal LVF. 02/03/10 Nuclear stress test: Normal. Currently patient does not have any chest pain, no shortness of breath or palpitation. EKG on admission has no ischemic change. She is followed by Dr. Walker Shadow cardiology. Trop was negative x 1. I recommended continuing home meds (ASA 81mg , Imdur, Metoprolol). May need to start statin as outpatient.  # Abdominal pain, epigastric: Chronic since March since her SOB and bowel resection, likely multifactorial. Follows with Dr. Matthias Hughs. She had a normal gastric emptying study. Abdominal CT 08/08/13 showed abdominal wall inflammation completed 1 month trial of mobic. Another possibility is intra-abdominal adhesion post surgery. Patient had a mild elevation of lipase in the past, but it is normal during this amdssion.   # Hypertension: Bp is normal on admission. On Metoprolol 25mg , IMDUR 60 mg and lasix 40 mg qd. Continued home meds on discharge.  # Major depressive disorder: Patient looks clinically depressed. She is on Wellbutrin 150 mg tid, Prozac 20 mg qd and Elavil 25 mg qd. Patient does not endorse SI/HI at this time. I continuedf her home medications. She likely needs follow up with her psychiatrist, Dr. Donell Beers.   # Diastolic CHF: Last echo 07/06/13 showed EF 55-60% with grade 1 diastolic dysfunction. Patient is clinically dry. Her body weight decreased from  200lbs on 09/17/13 to 196lbs at  admission. She responded well to IVFs. There is no evidence of fluid overload at discharge.  # Hyperlipidemia: LDL was 138 on 07/19/13. Her goal is less than 100. Consider statin therapy as outpatient.    Discharge Vitals:   BP 121/81  Pulse 68  Temp(Src) 97.6 F (36.4 C) (Oral)  Resp 10  Ht 5\' 1"  (1.549 m)  Wt 192 lb 0.3 oz (87.1 kg)  BMI 36.30 kg/m2  SpO2 96%  Discharge Labs:  Results for orders placed during the hospital encounter of 10/14/13 (from the past 24 hour(s))  GLUCOSE, CAPILLARY     Status: Abnormal   Collection Time    10/14/13  3:57 PM      Result Value Range   Glucose-Capillary >600 (*) 70 - 99 mg/dL  CBC     Status: Abnormal   Collection Time    10/14/13  4:13 PM      Result Value Range   WBC 8.9  4.0 - 10.5 K/uL   RBC 4.79  3.87 - 5.11 MIL/uL   Hemoglobin 12.2  12.0 - 15.0 g/dL   HCT 91.4  78.2 - 95.6 %   MCV 76.4 (*) 78.0 - 100.0 fL   MCH 25.5 (*) 26.0 - 34.0 pg   MCHC 33.3  30.0 - 36.0 g/dL   RDW 21.3  08.6 - 57.8 %   Platelets 276  150 - 400 K/uL  COMPREHENSIVE METABOLIC PANEL     Status: Abnormal   Collection Time    10/14/13  4:13 PM      Result Value Range   Sodium 120 (*) 135 - 145 mEq/L   Potassium 4.2  3.5 - 5.1 mEq/L   Chloride 80 (*) 96 - 112 mEq/L   CO2 22  19 - 32 mEq/L   Glucose, Bld 682 (*) 70 - 99 mg/dL   BUN 38 (*) 6 - 23 mg/dL   Creatinine, Ser 4.69 (*) 0.50 - 1.10 mg/dL   Calcium 9.9  8.4 - 62.9 mg/dL   Total Protein 8.9 (*) 6.0 - 8.3 g/dL   Albumin 4.0  3.5 - 5.2 g/dL   AST 23  0 - 37 U/L   ALT 22  0 - 35 U/L   Alkaline Phosphatase 137 (*) 39 - 117 U/L   Total Bilirubin 0.5  0.3 - 1.2 mg/dL   GFR calc non Af Amer 26 (*) >90 mL/min   GFR calc Af Amer 31 (*) >90 mL/min  CG4 I-STAT (LACTIC ACID)     Status: None   Collection Time    10/14/13  4:34 PM      Result Value Range   Lactic Acid, Venous 1.93  0.5 - 2.2 mmol/L  POCT I-STAT 3, BLOOD GAS (G3P V)     Status: Abnormal   Collection Time    10/14/13  4:38 PM       Result Value Range   pH, Ven 7.421 (*) 7.250 - 7.300   pCO2, Ven 40.6 (*) 45.0 - 50.0 mmHg   pO2, Ven 57.0 (*) 30.0 - 45.0 mmHg   Bicarbonate 26.4 (*) 20.0 - 24.0 mEq/L   TCO2 28  0 - 100 mmol/L   O2 Saturation 90.0     Acid-Base Excess 2.0  0.0 - 2.0 mmol/L   Sample type VENOUS    GLUCOSE, CAPILLARY     Status: Abnormal   Collection Time    10/14/13  7:03 PM  Result Value Range   Glucose-Capillary 466 (*) 70 - 99 mg/dL  URINALYSIS, ROUTINE W REFLEX MICROSCOPIC     Status: Abnormal   Collection Time    10/14/13  7:16 PM      Result Value Range   Color, Urine YELLOW  YELLOW   APPearance CLEAR  CLEAR   Specific Gravity, Urine 1.020  1.005 - 1.030   pH 5.0  5.0 - 8.0   Glucose, UA >1000 (*) NEGATIVE mg/dL   Hgb urine dipstick NEGATIVE  NEGATIVE   Bilirubin Urine NEGATIVE  NEGATIVE   Ketones, ur NEGATIVE  NEGATIVE mg/dL   Protein, ur NEGATIVE  NEGATIVE mg/dL   Urobilinogen, UA 0.2  0.0 - 1.0 mg/dL   Nitrite NEGATIVE  NEGATIVE   Leukocytes, UA NEGATIVE  NEGATIVE  URINE MICROSCOPIC-ADD ON     Status: None   Collection Time    10/14/13  7:16 PM      Result Value Range   Squamous Epithelial / LPF RARE  RARE   WBC, UA 0-2  <3 WBC/hpf  GLUCOSE, CAPILLARY     Status: Abnormal   Collection Time    10/14/13  7:58 PM      Result Value Range   Glucose-Capillary 480 (*) 70 - 99 mg/dL  GLUCOSE, CAPILLARY     Status: Abnormal   Collection Time    10/14/13  9:13 PM      Result Value Range   Glucose-Capillary 368 (*) 70 - 99 mg/dL  TROPONIN I     Status: None   Collection Time    10/14/13 10:25 PM      Result Value Range   Troponin I <0.30  <0.30 ng/mL  BASIC METABOLIC PANEL     Status: Abnormal   Collection Time    10/14/13 10:25 PM      Result Value Range   Sodium 129 (*) 135 - 145 mEq/L   Potassium 3.5  3.5 - 5.1 mEq/L   Chloride 92 (*) 96 - 112 mEq/L   CO2 27  19 - 32 mEq/L   Glucose, Bld 337 (*) 70 - 99 mg/dL   BUN 32 (*) 6 - 23 mg/dL   Creatinine, Ser 8.29 (*) 0.50  - 1.10 mg/dL   Calcium 8.8  8.4 - 56.2 mg/dL   GFR calc non Af Amer 31 (*) >90 mL/min   GFR calc Af Amer 36 (*) >90 mL/min  LIPASE, BLOOD     Status: None   Collection Time    10/14/13 10:25 PM      Result Value Range   Lipase 47  11 - 59 U/L  GLUCOSE, CAPILLARY     Status: Abnormal   Collection Time    10/14/13 11:50 PM      Result Value Range   Glucose-Capillary 280 (*) 70 - 99 mg/dL   Comment 1 Notify RN    MRSA PCR SCREENING     Status: None   Collection Time    10/15/13 12:43 AM      Result Value Range   MRSA by PCR NEGATIVE  NEGATIVE  BASIC METABOLIC PANEL     Status: Abnormal   Collection Time    10/15/13  4:35 AM      Result Value Range   Sodium 137  135 - 145 mEq/L   Potassium 3.4 (*) 3.5 - 5.1 mEq/L   Chloride 101  96 - 112 mEq/L   CO2 26  19 - 32 mEq/L   Glucose, Bld  163 (*) 70 - 99 mg/dL   BUN 25 (*) 6 - 23 mg/dL   Creatinine, Ser 4.09 (*) 0.50 - 1.10 mg/dL   Calcium 8.7  8.4 - 81.1 mg/dL   GFR calc non Af Amer 41 (*) >90 mL/min   GFR calc Af Amer 47 (*) >90 mL/min  GLUCOSE, CAPILLARY     Status: Abnormal   Collection Time    10/15/13  4:46 AM      Result Value Range   Glucose-Capillary 159 (*) 70 - 99 mg/dL   Comment 1 Notify RN    GLUCOSE, CAPILLARY     Status: Abnormal   Collection Time    10/15/13  7:49 AM      Result Value Range   Glucose-Capillary 159 (*) 70 - 99 mg/dL   Comment 1 Notify RN     Comment 2 Documented in Chart      Signed: Pleas Koch, MD 10/15/2013, 11:59 AM   Time Spent on Discharge: 25 minutes Services Ordered on Discharge: None Equipment Ordered on Discharge: None

## 2013-10-15 NOTE — Progress Notes (Signed)
UR completed 

## 2013-10-16 ENCOUNTER — Ambulatory Visit (INDEPENDENT_AMBULATORY_CARE_PROVIDER_SITE_OTHER): Payer: PRIVATE HEALTH INSURANCE | Admitting: Internal Medicine

## 2013-10-16 ENCOUNTER — Encounter (INDEPENDENT_AMBULATORY_CARE_PROVIDER_SITE_OTHER): Payer: Self-pay

## 2013-10-16 ENCOUNTER — Ambulatory Visit: Payer: Self-pay | Admitting: Internal Medicine

## 2013-10-16 ENCOUNTER — Encounter: Payer: Self-pay | Admitting: Internal Medicine

## 2013-10-16 VITALS — BP 171/93 | HR 84 | Temp 97.8°F | Ht 63.0 in | Wt 203.0 lb

## 2013-10-16 DIAGNOSIS — I2510 Atherosclerotic heart disease of native coronary artery without angina pectoris: Secondary | ICD-10-CM

## 2013-10-16 DIAGNOSIS — R1013 Epigastric pain: Secondary | ICD-10-CM

## 2013-10-16 DIAGNOSIS — I10 Essential (primary) hypertension: Secondary | ICD-10-CM

## 2013-10-16 DIAGNOSIS — E1165 Type 2 diabetes mellitus with hyperglycemia: Secondary | ICD-10-CM

## 2013-10-16 LAB — COMPREHENSIVE METABOLIC PANEL
ALT: 46 U/L — ABNORMAL HIGH (ref 0–35)
Albumin: 4.3 g/dL (ref 3.5–5.2)
Alkaline Phosphatase: 117 U/L (ref 39–117)
BUN: 16 mg/dL (ref 6–23)
CO2: 21 mEq/L (ref 19–32)
Calcium: 9.8 mg/dL (ref 8.4–10.5)
Chloride: 94 mEq/L — ABNORMAL LOW (ref 96–112)
Creat: 1.5 mg/dL — ABNORMAL HIGH (ref 0.50–1.10)
Sodium: 130 mEq/L — ABNORMAL LOW (ref 135–145)
Total Bilirubin: 0.2 mg/dL — ABNORMAL LOW (ref 0.3–1.2)
Total Protein: 7.7 g/dL (ref 6.0–8.3)

## 2013-10-16 LAB — GLUCOSE, CAPILLARY: Glucose-Capillary: 371 mg/dL — ABNORMAL HIGH (ref 70–99)

## 2013-10-16 MED ORDER — INSULIN ASPART 100 UNIT/ML FLEXPEN: 12.0000 [IU] | PEN_INJECTOR | Freq: Three times a day (TID) | SUBCUTANEOUS | Status: DC

## 2013-10-16 MED ORDER — ISOSORBIDE MONONITRATE ER 60 MG PO TB24: 60.0000 mg | ORAL_TABLET | Freq: Every day | ORAL | Status: DC

## 2013-10-16 MED ORDER — INSULIN GLARGINE 100 UNIT/ML ~~LOC~~ SOLN: 60.0000 [IU] | Freq: Every day | SUBCUTANEOUS | Status: DC

## 2013-10-16 MED ORDER — PROMETHAZINE HCL 25 MG PO TABS: 25.0000 mg | ORAL_TABLET | Freq: Four times a day (QID) | ORAL | Status: DC | PRN

## 2013-10-16 NOTE — Progress Notes (Signed)
Subjective:   Patient ID: [name] female   DOB: Dec 10, 1950 62 y.o.   MRN: [id number]  Chief Complaint  Patient presents with  . Diabetes    FOLLOW DM  . Abdominal Pain    ABD PAIN X 2 DAYS  . Medication Refill    MEDICATION REFILL   Refills request - isosorbide and phenergan (doesn't use frequently, cannot quantify how often) --> both refilled today  HPI: [name] is a 62 y.o. woman with DM, GERD & HTN who presents for routine follow up.  Feels overwhlemed, and feels badly that her DM was once controlled and no longer is controlled.  Situation at home with her husband is status quo, but his mother spoke with him recently, hoping for a change.   Hospitalized 10/14/13 for hyperglycemia - BMET needed today. 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  Also started on Restoril 15mg  -- plan to return in 2 months.  Regarding chronic abdominal pain - worse right now, hasn't been taking much tylenol. Continues to have nausea and regular BMs. No vomiting.  Review of Systems: Pertinent positives and negatives per HPI   Past Medical History  Diagnosis Date  . Hypertension   . Peripheral neuropathy   . Cervicalgia   . Low back pain syndrome   . Hyperlipidemia   . Vitamin D deficiency   . Vitamin B12 deficiency   . Iliotibial band syndrome   . Anemia   . Borderline personality disorder   . Nonorganic psychosis   . GERD (gastroesophageal reflux disease)   . Diabetes mellitus   . Depression   . Diabetic gastroparesis   . Sleep apnea    . CAD (coronary artery disease)     Catherization 11/18/09>nonobstructive CAD , normal LV function, EF 65%  . Health maintenance examination     Colonoscopy 2009> normal; Diabetic eye exam 12/2008. No diabetic retinopathy; Mammogram 11/10> No evidence of malignancy, DXA 03/14/13 : normal  . Headache(784.0)   . Arthritis   . SBO (small bowel obstruction) 01/09/2013  . Diastolic CHF     Grade I, on Echo 06/2013, EF 55-60%   Current Outpatient Prescriptions  Medication Sig Dispense Refill  . ACCU-CHEK FASTCLIX LANCETS MISC 1 each by Does not apply route 3 (three) times daily before meals. Dx code 250.00  102 each  12  . acetaminophen (TYLENOL) 325 MG tablet Take 325-650 mg by mouth every 6 (six) hours as needed for mild pain or headache.      Marland Kitchen amitriptyline (ELAVIL) 25 MG tablet Take 1 tablet (25 mg total) by mouth at bedtime.  30 tablet  1  . aspirin 81 MG chewable tablet Chew 81 mg by mouth daily.      Marland Kitchen buPROPion (WELLBUTRIN XL) 150 MG 24 hr tablet Take 3 tablets (450 mg total) by mouth daily.  90 tablet  5  . dexlansoprazole (DEXILANT) 60 MG capsule Take 60 mg by mouth daily.        Tery Sanfilippo Sodium (COLACE PO) Take 2 tablets by mouth daily as needed (constipation).      Marland Kitchen FLUoxetine (PROZAC) 20 MG capsule Take 1  capsule (20 mg total) by mouth every morning.  30 capsule  5  . furosemide (LASIX) 40 MG tablet Take 1 tablet (40 mg total) by mouth daily.  30 tablet  11  . gabapentin (NEURONTIN) 800 MG tablet Take 800-1,600 mg by mouth 2 (two) times daily. 800 mg in the am and 1600 mg in the evening.      Marland Kitchen glucose blood (ACCU-CHEK SMARTVIEW) test strip Use to check blood sugar before meals dx code 250.00  100 each  12  . glucose monitoring kit (FREESTYLE) monitoring kit 1 each by Does not apply route as needed for other.  1 each  0  . insulin aspart (NOVOLOG FLEXPEN) 100 UNIT/ML SOPN FlexPen Inject 10 Units into the skin 3 (three) times daily with meals.  5 pen  12  . insulin glargine (LANTUS)  100 UNIT/ML injection Inject 0.55 mLs (55 Units total) into the skin at bedtime.  3 pen  12  . Insulin Pen Needle 31G X 5 MM MISC Box of 100 - uses up to 5 times per day according to CBG  100 each  11  . isosorbide mononitrate (IMDUR) 60 MG 24 hr tablet Take 1 tablet (60 mg total) by mouth daily.  30 tablet  6  . metoprolol succinate (TOPROL-XL) 25 MG 24 hr tablet Take 1 tablet (25 mg total) by mouth daily.  30 tablet  11  . promethazine (PHENERGAN) 25 MG tablet Take 1 tablet (25 mg total) by mouth every 6 (six) hours as needed for nausea.  20 tablet  1  . senna (SENOKOT) 8.6 MG TABS Take 4 tablets by mouth daily as needed (constipation).      . temazepam (RESTORIL) 15 MG capsule Take 1 capsule (15 mg total) by mouth at bedtime as needed for sleep.  30 capsule  3  . traMADol (ULTRAM) 50 MG tablet Take 1 tablet (50 mg total) by mouth every 8 (eight) hours as needed for pain.  90 tablet  0   No current facility-administered medications for this visit.   Family History  Problem Relation Age of Onset  . Diabetes Mother   . Hypertension Mother   . Hyperlipidemia Mother   . Arthritis Mother   . Depression Mother   . Heart disease Father   . Diabetes Sister   . Diabetes Brother   . Heart disease Brother   . Heart disease Paternal Grandmother   . Seizures Brother    History   Social History  . Marital Status: Married    Spouse Name: N/A    Number of Children: N/A  . Years of Education: N/A   Social History Main Topics  . Smoking status: Former Smoker    Types: Cigarettes    Quit date: 10/25/2002  . Smokeless tobacco: Not on file  . Alcohol Use: No  . Drug Use: No  . Sexual Activity: Yes   Other Topics Concern  . Not on file   Social History Narrative  . No narrative on file    Objective:  Physical Exam: Filed Vitals:   10/16/13 0953  BP: 171/93  Pulse: 84  Temp: 97.8 F (36.6 C)  TempSrc: Oral  Height: 5\' 3"  (1.6 m)  Weight: 203 lb (92.08 kg)  SpO2: 99%    General: appears depressed  HEENT: PERRL, EOMI, no scleral icterus, top dentures in place  Cardiac: RRR, no rubs, murmurs or gallops  Pulm: clear to auscultation bilaterally, moving normal volumes of air  Abd: soft,  diffusely mildly tender, worst in epigastric region, bloated, nondistended, BS normoactive  Ext: warm and well perfused, no pedal edema  Neuro: alert and oriented X3, cranial nerves II-XII grossly intact   Assessment & Plan:  Case and care discussed with Dr. Kem Kays.  Please see problem oriented charting for further details. Patient to return in 1 month for DM follow up.

## 2013-10-16 NOTE — Assessment & Plan Note (Signed)
Chronic- followed by Dr. Matthias Hughs and at [facility]; thought to have abd wall inflammation. Pain mgmt with scheduled tylenol (1000mg  TID or 975mg  TID, whichever dose she has)

## 2013-10-16 NOTE — Assessment & Plan Note (Signed)
On ASA and BB; will need to investigate why not on statin

## 2013-10-16 NOTE — Patient Instructions (Signed)
General Instructions: -I know it's been a tough year, a new one is around the corner!  -Your diabetes is still uncontrolled, so let's work on taking your insulin regularly.  Increase your lantus to 60 units before bed, and mealtime insulin (aspart) to 12 units three times a day before meals  -Your blood pressure is high today, I think pain is contributing, because your pressure was doing well in the hospital, continue your isosorbide, lasix and metoprolol, but also let's manage your pain  -Please start taking tylenol SCHEDULED.  If you have the 500mg  pills, take 1000 mg (2 tablets) THREE TIMES A DAY.  If you have 325mg  pills, take 975mg  (3 tablets) THREE TIMES A DAY.  You may not take more than 4000mg  of tylenol in one day.  -Regarding your amitriptyline, you may start taking half a pill until you run out.  When you are done, if you feel your pain is worse or you are not sleeping as well, let us know, and we can restart this medication  Please be sure to bring all of your medications with you to every visit.  Should you have any new or worsening symptoms, please be sure to call the clinic at [phone].   Treatment Goals:  Goals (1 Years of Data) as of 10/16/13         As of Today 10/15/13 10/15/13 10/15/13 10/14/13     Blood Pressure    . Blood Pressure < 150/90  171/93 128/57 121/81 124/63 132/65     Result Component    . HEMOGLOBIN A1C < 7.0          . LDL CALC < 100            Progress Toward Treatment Goals:  Treatment Goal 10/16/2013  Hemoglobin A1C unchanged  Blood pressure deteriorated    Self Care Goals & Plans:  Self Care Goal 10/16/2013  Manage my medications take my medicines as prescribed  Monitor my health keep track of my blood glucose; bring my blood pressure log to each visit  Eat healthy foods drink diet soda or water instead of juice or soda; eat more vegetables  Be physically active find time in my schedule; find workout friends  Meeting treatment goals  maintain the current self-care plan    Home Blood Glucose Monitoring 10/16/2013  Check my blood sugar 4 times a day  When to check my blood sugar before meals; at bedtime     Care Management & Community Referrals:  Referral 09/26/2013  Referrals made for care management support diabetes educator  Referrals made to community resources none

## 2013-10-16 NOTE — Assessment & Plan Note (Signed)
Lab Results  Component Value Date   HGBA1C 13.4 09/12/2013   HGBA1C 7.2 05/23/2013   HGBA1C 7.3 02/21/2013     Assessment: Diabetes control: poor control (HgbA1C >9%) Progress toward A1C goal:  unchanged  Plan: Medications:  increase lantus to 60u qHS, increase aspart to 12u TIDWC Home glucose monitoring: Frequency: 4 times a day Timing: before meals;at bedtime Instruction/counseling given: reminded to bring blood glucose meter & log to each visit and reminded to bring medications to each visit

## 2013-10-16 NOTE — Assessment & Plan Note (Signed)
BP Readings from Last 3 Encounters:  10/16/13 171/93  10/15/13 128/57  09/26/13 136/111    Lab Results  Component Value Date   NA 137 10/15/2013   K 3.4* 10/15/2013   CREATININE 1.35* 10/15/2013    Assessment: Blood pressure control: moderately elevated Progress toward BP goal:  deteriorated  Plan: Medications:  likely related to pain, BP was well controlled in the hospital yesterday on lasix, metoprolol and isosorbide; has been on losartan in the past, d/c acutely d/t hyperkalemia in November, will plan to restart once stable labs noted, CMET today

## 2013-10-16 NOTE — Discharge Summary (Signed)
I saw [name] on day of discharge and assisted with the discharge planning.

## 2013-10-22 NOTE — Progress Notes (Signed)
Case discussed with Dr. Sharda at time of visit.  We reviewed the resident's history and exam and pertinent patient test results.  I agree with the assessment, diagnosis, and plan of care documented in the resident's note. 

## 2013-10-26 ENCOUNTER — Telehealth: Payer: Self-pay | Admitting: Dietician

## 2013-10-26 NOTE — Telephone Encounter (Signed)
Has not had eye exam since 2013. Please remind or assist patient in scheduling appointment.

## 2013-10-29 ENCOUNTER — Encounter: Payer: Self-pay | Admitting: Dietician

## 2013-10-29 ENCOUNTER — Telehealth (HOSPITAL_COMMUNITY): Payer: Self-pay

## 2013-10-29 ENCOUNTER — Ambulatory Visit (INDEPENDENT_AMBULATORY_CARE_PROVIDER_SITE_OTHER): Payer: PRIVATE HEALTH INSURANCE | Admitting: Dietician

## 2013-10-29 ENCOUNTER — Ambulatory Visit: Payer: Self-pay | Admitting: Internal Medicine

## 2013-10-29 VITALS — Wt 204.5 lb

## 2013-10-29 DIAGNOSIS — IMO0002 Reserved for concepts with insufficient information to code with codable children: Secondary | ICD-10-CM

## 2013-10-29 DIAGNOSIS — Z794 Long term (current) use of insulin: Principal | ICD-10-CM

## 2013-10-29 DIAGNOSIS — IMO0001 Reserved for inherently not codable concepts without codable children: Secondary | ICD-10-CM

## 2013-10-29 DIAGNOSIS — E1165 Type 2 diabetes mellitus with hyperglycemia: Secondary | ICD-10-CM

## 2013-10-29 NOTE — Patient Instructions (Addendum)
You mentioned that drinking VEGETABLE juice may help you feel full.  Your vegetable tray seemed to help you lower your blood sugar.   Consider TV dinners- HEALTHY CHOICE meals. Eating two well balanced dinner is better than one unbalanced meal or excessive snacking.   Keep up the great work taking steps to be healthier and happier!    Increase your mealtime/Novolog insulin to 15 units with each meal, three times a day.    Please make a follow up appointment for 3-4 weeks

## 2013-10-29 NOTE — Progress Notes (Signed)
Medical Nutrition Therapy:  Appt start time: 1315 end time:  1400. Visit 1 of this year Assessment:  Primary concerns today: Blood sugar control.  She is feeling very sad today. "It comes and goes"  However, is taking steps to get better. Called her therapist to reschedule a missed appointment, tried to confront people in her life to ask for their help. Says she went into a an ice cream binge, then a juice binge in December and sometimes candy. Recently has been eating more vegetables. Her weight is increased 2 # today. Blood sugars trending down but still high with lowest 199 this am fasting.  Patient description of her eating pattern is " when I begin eating, I do not stop until I go to bed"  Recent physical activity includes activities of daily lviing. Has appointment with eye doctor this month. Called to rescheduel her appoitnment  Progress Towards Goal(s):  Some progress   Nutritional Diagnosis: NB-1.2 Harmful beliefs/attitudes about food or nutrition-related topics as related to using sweets as a coping mechanism for her depression is improving as evidenced by her report of eating more vegetables and having a more relaxed view about the negative health consequences such as hyperglycemia    Intervention:  Nutrition education including healthier and easy options to fill up on when not feeling like cooking or meal planning. Nutrition counseling about effects of depression on diabetes self care and meal planning. Support and encouragement [provided. Care coordiantion- discussed meter download with physician who agreed to increase meal time insulin dose.   Monitoring/Evaluation:  Dietary intake, exercise, meter/blood sugar, and body weight in 4 week(s).

## 2013-10-30 LAB — MICROALBUMIN / CREATININE URINE RATIO
CREATININE, URINE: 46.7 mg/dL
Microalb Creat Ratio: 119.7 mg/g — ABNORMAL HIGH (ref 0.0–30.0)
Microalb, Ur: 5.59 mg/dL — ABNORMAL HIGH (ref 0.00–1.89)

## 2013-11-13 ENCOUNTER — Telehealth: Payer: Self-pay | Admitting: *Deleted

## 2013-11-13 NOTE — Telephone Encounter (Signed)
[facility]anda [facility] called [phone] BP 160/100  on 11/13/13 and 10/30/13 160/92. CBG 302 - 344/ Called Burna MortimerWanda and pt - aware of appt with Dr Everardo BealsSharda 11/15/13 10:45AM - to bring meter. [name] KidneyDebra Karion Cudd RN 11/13/13 3PM

## 2013-11-15 ENCOUNTER — Encounter: Payer: Self-pay | Admitting: Licensed Clinical Social Worker

## 2013-11-15 ENCOUNTER — Ambulatory Visit (INDEPENDENT_AMBULATORY_CARE_PROVIDER_SITE_OTHER): Payer: PRIVATE HEALTH INSURANCE | Admitting: Internal Medicine

## 2013-11-15 ENCOUNTER — Encounter: Payer: Self-pay | Admitting: Internal Medicine

## 2013-11-15 VITALS — BP 161/89 | HR 86 | Temp 97.4°F | Ht 65.0 in | Wt 209.9 lb

## 2013-11-15 DIAGNOSIS — I10 Essential (primary) hypertension: Secondary | ICD-10-CM

## 2013-11-15 DIAGNOSIS — IMO0001 Reserved for inherently not codable concepts without codable children: Secondary | ICD-10-CM

## 2013-11-15 DIAGNOSIS — M533 Sacrococcygeal disorders, not elsewhere classified: Secondary | ICD-10-CM

## 2013-11-15 DIAGNOSIS — M1990 Unspecified osteoarthritis, unspecified site: Secondary | ICD-10-CM

## 2013-11-15 DIAGNOSIS — M479 Spondylosis, unspecified: Secondary | ICD-10-CM

## 2013-11-15 DIAGNOSIS — IMO0002 Reserved for concepts with insufficient information to code with codable children: Secondary | ICD-10-CM

## 2013-11-15 DIAGNOSIS — Z794 Long term (current) use of insulin: Secondary | ICD-10-CM

## 2013-11-15 DIAGNOSIS — E1165 Type 2 diabetes mellitus with hyperglycemia: Secondary | ICD-10-CM

## 2013-11-15 MED ORDER — LOSARTAN POTASSIUM 100 MG PO TABS: 100.0000 mg | ORAL_TABLET | Freq: Every day | ORAL | Status: DC

## 2013-11-15 MED ORDER — TRAMADOL HCL 50 MG PO TABS: 50.0000 mg | ORAL_TABLET | Freq: Three times a day (TID) | ORAL | Status: DC | PRN

## 2013-11-15 MED ORDER — INSULIN ASPART 100 UNIT/ML FLEXPEN: 17.0000 [IU] | PEN_INJECTOR | Freq: Three times a day (TID) | SUBCUTANEOUS | Status: DC

## 2013-11-15 MED ORDER — INSULIN GLARGINE 100 UNIT/ML SOLOSTAR PEN: 65.0000 [IU] | PEN_INJECTOR | Freq: Every day | SUBCUTANEOUS | Status: DC

## 2013-11-15 NOTE — Assessment & Plan Note (Signed)
BP Readings from Last 3 Encounters:  11/15/13 161/89  10/16/13 171/93  10/15/13 128/57    Lab Results  Component Value Date   NA 130* 10/16/2013   K 4.5 10/16/2013   CREATININE 1.50* 10/16/2013    Assessment: Blood pressure control: moderately elevated Progress toward BP goal:  unchanged Comments: Patient has been off losartan since Oct due to acute illness causing ARF  Plan: Medications:  restart losartan 100, return in 1 week for BMET; continue metoprolol 25, lasix 40, imdur 60 Educational resources provided: brochure;handout;video

## 2013-11-15 NOTE — Patient Instructions (Signed)
General Instructions:  -Regarding your blood pressure, let's restart Losartan 100mg  daily - return in 1 week for blood work -Regarding your diabetes, increase lantus (night time insulin) to 65 units before bed, and increase your meal time insulin to 17 units three times a day with meals -Regarding your hot flashes, we will discuss the idea of a medicine named Clonidine at your next visit with me  Please be sure to bring all of your medications with you to every visit.  Should you have any new or worsening symptoms, please be sure to call the clinic at [phone].  Treatment Goals:  Goals (1 Years of Data) as of 11/15/13         As of Today 10/16/13 10/15/13 10/15/13 10/15/13     Blood Pressure    . Blood Pressure < 150/90  161/89 171/93 128/57 121/81 124/63     Result Component    . HEMOGLOBIN A1C < 7.0          . LDL CALC < 100            Progress Toward Treatment Goals:  Treatment Goal 11/15/2013  Hemoglobin A1C unchanged  Blood pressure unchanged    Self Care Goals & Plans:  Self Care Goal 11/15/2013  Manage my medications take my medicines as prescribed; bring my medications to every visit; refill my medications on time; follow the sick day instructions if I am sick  Monitor my health keep track of my blood glucose; bring my glucose meter and log to each visit; check my feet daily  Eat healthy foods eat more vegetables; eat fruit for snacks and desserts; drink diet soda or water instead of juice or soda  Be physically active find an activity I enjoy  Meeting treatment goals maintain the current self-care plan    Home Blood Glucose Monitoring 11/15/2013  Check my blood sugar 3 times a day  When to check my blood sugar before breakfast; before dinner; at bedtime     Care Management & Community Referrals:  Referral 11/15/2013  Referrals made for care management support diabetes educator  Referrals made to community resources weight management; exercise/physical therapy;  nutrition

## 2013-11-15 NOTE — Assessment & Plan Note (Signed)
Lab Results  Component Value Date   HGBA1C 13.4 09/12/2013   HGBA1C 7.2 05/23/2013   HGBA1C 7.3 02/21/2013     Assessment: Diabetes control: poor control (HgbA1C >9%) Progress toward A1C goal:  unchanged  Plan: Medications:  Increase lantus to 65 qHS and novolog to 17u TIDWC Home glucose monitoring: Frequency: 3 times a day Timing: before breakfast;before dinner;at bedtime Instruction/counseling given: reminded to bring blood glucose meter & log to each visit, reminded to bring medications to each visit and discussed the need for weight loss Educational resources provided: brochure;handout Self management tools provided: copy of home glucose meter download Other plans: congratulated her on starting to exercise!

## 2013-11-15 NOTE — Progress Notes (Signed)
[name].  Pt provided CSW with completed [employer] application and eyeglass prescription.  CSW discussed program with [name].  Pt aware time frame is at least 6 wks.  CSW will notify pt once determination is made.

## 2013-11-15 NOTE — Progress Notes (Signed)
Subjective:   Patient ID: [name] female   DOB: [birth year] 64 y.o.   MRN: [id number]  Chief Complaint  Patient presents with  . Follow-up    Since last visit - cont with CBG high and BP is up. CBG this AM 336. Referral to [facility] clinic - hot flashes.  . Medication Refill    Only tramadol.    HPI: [name] is a 63 y.o. woman with DM, GERD & HTN who presents for DM follow up (I last saw pt on 10/16/13)  Plan to see Butch Penny first week of Feb. CBGs have been high - avg 343, checking CBGs 2-3x/day, highest was 552, lowest was 151.  Sometimes feels thirsty - drinks a lot all the time. Takes 60 lantus qHS and novolog 15 TIDWC. Started walking at the [facility] yesterday!!!! Plans to go again in the morning  Taking tramadol for back pain - has been taking 1 pill per day in addition to tylenol.  Abdominal pain improved, never did pick up gas-x - no pain since Sunday. Regular BMs with senna.   Persistent hot flashes. Has been on gabapentin, on SSRI + NDRI. Hysterectomy in 1980s, has been having host flashes for 20 some years. Has been on estrogen before, but was taken off.   Review of Systems: Constitutional: Denies fever, chills, diaphoresis, appetite change and fatigue.  HEENT: Denies photophobia, eye pain, redness, hearing loss, ear pain, congestion, sore throat, rhinorrhea, sneezing, mouth sores, trouble swallowing, neck pain, neck stiffness and tinnitus.  Respiratory: Denies SOB, DOE, cough, chest tightness, and wheezing.  Cardiovascular: Denies palpitations  Gastrointestinal: Denies vomiting, abdominal pain, diarrhea, blood in stool and abdominal distention. Occ nausea Genitourinary: Denies dysuria, urgency, frequency, hematuria, flank pain and difficulty urinating.  Musculoskeletal: chronic back pain  Skin: Denies pallor, rash and wound.  Neurological: Denies dizziness, seizures, syncope, weakness, lightheadedness, numbness and headaches.    Past Medical History  Diagnosis Date    . Hypertension   . Peripheral neuropathy   . Cervicalgia   . Low back pain syndrome   . Hyperlipidemia   . Vitamin D deficiency   . Vitamin B12 deficiency   . Iliotibial band syndrome   . Anemia   . Borderline personality disorder   . Nonorganic psychosis   . GERD (gastroesophageal reflux disease)   . Diabetes mellitus   . Depression   . Diabetic gastroparesis   . Sleep apnea   . CAD (coronary artery disease)     Catherization 11/18/09>nonobstructive CAD , normal LV function, EF 65%  . Health maintenance examination     Colonoscopy 2009> normal; Diabetic eye exam 12/2008. No diabetic retinopathy; Mammogram 11/10> No evidence of malignancy, DXA 03/14/13 : normal  . Headache(784.0)   . Arthritis   . SBO (small bowel obstruction) 01/09/2013  . Diastolic CHF     Grade I, on Echo 06/2013, EF 55-60%   Current Outpatient Prescriptions  Medication Sig Dispense Refill  . ACCU-CHEK FASTCLIX LANCETS MISC 1 each by Does not apply route 3 (three) times daily before meals. Dx code 250.00  102 each  12  . acetaminophen (TYLENOL) 325 MG tablet Take 325-650 mg by mouth every 6 (six) hours as needed for mild pain or headache.      Marland Kitchen amitriptyline (ELAVIL) 25 MG tablet Take 1 tablet (25 mg total) by mouth at bedtime.  30 tablet  1  . aspirin 81 MG chewable tablet Chew 81 mg by mouth daily.      Marland Kitchen  buPROPion (WELLBUTRIN XL) 150 MG 24 hr tablet Take 3 tablets (450 mg total) by mouth daily.  90 tablet  5  . dexlansoprazole (DEXILANT) 60 MG capsule Take 60 mg by mouth daily.        Mariane Baumgarten Sodium (COLACE PO) Take 2 tablets by mouth daily as needed (constipation).      Marland Kitchen FLUoxetine (PROZAC) 20 MG capsule Take 1 capsule (20 mg total) by mouth every morning.  30 capsule  5  . furosemide (LASIX) 40 MG tablet Take 1 tablet (40 mg total) by mouth daily.  30 tablet  11  . gabapentin (NEURONTIN) 800 MG tablet Take 800-1,600 mg by mouth 2 (two) times daily. 800 mg in the am and 1600 mg in the evening.      Marland Kitchen  glucose blood (ACCU-CHEK SMARTVIEW) test strip Use to check blood sugar before meals dx code 250.00  100 each  12  . glucose monitoring kit (FREESTYLE) monitoring kit 1 each by Does not apply route as needed for other.  1 each  0  . insulin aspart (NOVOLOG FLEXPEN) 100 UNIT/ML SOPN FlexPen Inject 12 Units into the skin 3 (three) times daily with meals.  5 pen  12  . insulin glargine (LANTUS) 100 UNIT/ML injection Inject 0.6 mLs (60 Units total) into the skin at bedtime.  3 pen  12  . Insulin Pen Needle 31G X 5 MM MISC Box of 100 - uses up to 5 times per day according to CBG  100 each  11  . isosorbide mononitrate (IMDUR) 60 MG 24 hr tablet Take 1 tablet (60 mg total) by mouth daily.  30 tablet  6  . metoprolol succinate (TOPROL-XL) 25 MG 24 hr tablet Take 1 tablet (25 mg total) by mouth daily.  30 tablet  11  . promethazine (PHENERGAN) 25 MG tablet Take 1 tablet (25 mg total) by mouth every 6 (six) hours as needed for nausea.  20 tablet  1  . senna (SENOKOT) 8.6 MG TABS Take 4 tablets by mouth daily as needed (constipation).      . temazepam (RESTORIL) 15 MG capsule Take 1 capsule (15 mg total) by mouth at bedtime as needed for sleep.  30 capsule  3  . traMADol (ULTRAM) 50 MG tablet Take 1 tablet (50 mg total) by mouth every 8 (eight) hours as needed for pain.  90 tablet  0   No current facility-administered medications for this visit.   Family History  Problem Relation Age of Onset  . Diabetes Mother   . Hypertension Mother   . Hyperlipidemia Mother   . Arthritis Mother   . Depression Mother   . Heart disease Father   . Diabetes Sister   . Diabetes Brother   . Heart disease Brother   . Heart disease Paternal Grandmother   . Seizures Brother    History   Social History  . Marital Status: Married    Spouse Name: N/A    Number of Children: N/A  . Years of Education: N/A   Social History Main Topics  . Smoking status: Former Smoker    Types: Cigarettes    Quit date: 10/25/2002  .  Smokeless tobacco: Not on file  . Alcohol Use: No  . Drug Use: No  . Sexual Activity: Yes   Other Topics Concern  . Not on file   Social History Narrative  . No narrative on file    Objective:  Physical Exam: Filed Vitals:   11/15/13  1053  BP: 161/89  Pulse: 86  Temp: 97.4 F (36.3 C)  TempSrc: Oral  Height: _0  (1.651 m)  Weight: 209 lb 14.4 oz (95.21 kg)  SpO2: 100%   General: appears to be in better spirits today HEENT: PERRL, EOMI, no scleral icterus, top dentures in place  Cardiac: RRR, no rubs, murmurs or gallops  Pulm: clear to auscultation bilaterally, moving normal volumes of air  Abd: soft, nontender, nondistended, BS normoactive  Ext: warm and well perfused, no pedal edema  Neuro: alert and oriented X3, cranial nerves II-XII grossly intact  Assessment & Plan:  Case and care discussed with Dr. Lynnae January.  Please see problem oriented charting for further details. Patient to return in March for DM/HTN follow up.  Discuss clonidine for hot flashes at next visit.

## 2013-11-15 NOTE — Progress Notes (Signed)
Case discussed with Dr. Sharda soon after the resident saw the patient.  We reviewed the resident's history and exam and pertinent patient test results.  I agree with the assessment, diagnosis, and plan of care documented in the resident's note. 

## 2013-11-15 NOTE — Assessment & Plan Note (Signed)
Refilled Tramadol 50, 1 pill q8h prn #90, patient takes 1-2 per day, some days without medication. Also takes tylenol.

## 2013-11-22 ENCOUNTER — Other Ambulatory Visit (INDEPENDENT_AMBULATORY_CARE_PROVIDER_SITE_OTHER): Payer: PRIVATE HEALTH INSURANCE

## 2013-11-22 DIAGNOSIS — I10 Essential (primary) hypertension: Secondary | ICD-10-CM

## 2013-11-22 LAB — BASIC METABOLIC PANEL
BUN: 18 mg/dL (ref 6–23)
CHLORIDE: 98 meq/L (ref 96–112)
CO2: 25 meq/L (ref 19–32)
Calcium: 8.8 mg/dL (ref 8.4–10.5)
Creat: 1.36 mg/dL — ABNORMAL HIGH (ref 0.50–1.10)
Glucose, Bld: 314 mg/dL — ABNORMAL HIGH (ref 70–99)
Potassium: 3.9 mEq/L (ref 3.5–5.3)
Sodium: 135 mEq/L (ref 135–145)

## 2013-11-27 ENCOUNTER — Encounter: Payer: Self-pay | Admitting: Dietician

## 2013-12-04 ENCOUNTER — Ambulatory Visit (INDEPENDENT_AMBULATORY_CARE_PROVIDER_SITE_OTHER): Payer: 59 | Admitting: Licensed Clinical Social Worker

## 2013-12-04 DIAGNOSIS — F332 Major depressive disorder, recurrent severe without psychotic features: Secondary | ICD-10-CM

## 2013-12-04 NOTE — Progress Notes (Signed)
   THERAPIST PROGRESS NOTE  Session Time: 10:30am-11:20am  Participation Level: Active  Behavioral Response: Well GroomedAlertIrritable  Type of Therapy: Individual Therapy  Treatment Goals addressed: Coping  Interventions: CBT, Motivational Interviewing, Strength-based, Supportive and Reframing  Summary: [name] is a 63 y.o. female who presents with irritable mood and affect. She reports being hospitalized multiple times since her last session due to very high blood sugar. She admits that she stopped taking her insulin because she was "tired" of sticking herself. Her blood sugar is better, but not completely under control. She is more committed to managing this disease, but needs ongoing encouragement and accountability. Processed this as the last session for patient and referred to [facility] for therapy due to insurance.    Suicidal/Homicidal: Nowithout intent/plan  Therapist Response: Assessed patients current functioning and reviewed progress. Reviewed coping strategies. Assessed patients safety and assisted in identifying protective factors.  Reviewed crisis plan with patient. Assisted patient with the expression of agiation. Reviewed patients self care plan. Assessed progress related to self care. Patients self care is fair, but improving. Recommend daily exercise, increased socialization and recreation. Used CBT to assist patient with the identification of negative distortions and irrational thoughts. Encouraged patient to verbalize alternative and factual responses which challenge thought distortions. Used motivational interviewing to assist and encourage patient through the change process. Explored patients barriers to change.   Plan: Referred to [facility] for ongoing therapy.   Diagnosis: Axis I: Major Depression, Recurrent severe    Axis II: No diagnosis    Sapna Padron, LCSW 12/04/2013

## 2013-12-05 ENCOUNTER — Ambulatory Visit (HOSPITAL_COMMUNITY): Payer: Self-pay | Admitting: Psychiatry

## 2013-12-06 ENCOUNTER — Encounter: Payer: Self-pay | Admitting: Licensed Clinical Social Worker

## 2013-12-10 ENCOUNTER — Encounter: Payer: Self-pay | Admitting: Licensed Clinical Social Worker

## 2013-12-10 NOTE — Progress Notes (Signed)
[name] presents today for scheduled appt with CSW to order Eyewear utilizing [employer] voucher.  At this time, CSW internet browser unable to pull up site to choose and order eyewear.  CSW showed several images of eyewear available on CSW cell phone.  [name] choosing black, cateye style.  CSW order eyewear on 2/016/15, after IT assisted CSW with utilizing alternate internet browser.  Letter mailed to pt.

## 2013-12-26 ENCOUNTER — Encounter: Payer: Self-pay | Admitting: Internal Medicine

## 2014-01-02 ENCOUNTER — Encounter: Payer: Self-pay | Admitting: Internal Medicine

## 2014-01-02 ENCOUNTER — Ambulatory Visit (INDEPENDENT_AMBULATORY_CARE_PROVIDER_SITE_OTHER): Payer: PRIVATE HEALTH INSURANCE | Admitting: Internal Medicine

## 2014-01-02 VITALS — BP 123/69 | HR 84 | Temp 97.7°F | Wt 211.3 lb

## 2014-01-02 DIAGNOSIS — E785 Hyperlipidemia, unspecified: Secondary | ICD-10-CM

## 2014-01-02 DIAGNOSIS — E1165 Type 2 diabetes mellitus with hyperglycemia: Secondary | ICD-10-CM

## 2014-01-02 DIAGNOSIS — N951 Menopausal and female climacteric states: Secondary | ICD-10-CM

## 2014-01-02 DIAGNOSIS — D509 Iron deficiency anemia, unspecified: Secondary | ICD-10-CM

## 2014-01-02 DIAGNOSIS — I10 Essential (primary) hypertension: Secondary | ICD-10-CM

## 2014-01-02 DIAGNOSIS — IMO0002 Reserved for concepts with insufficient information to code with codable children: Secondary | ICD-10-CM

## 2014-01-02 DIAGNOSIS — R232 Flushing: Secondary | ICD-10-CM | POA: Insufficient documentation

## 2014-01-02 DIAGNOSIS — IMO0001 Reserved for inherently not codable concepts without codable children: Secondary | ICD-10-CM

## 2014-01-02 DIAGNOSIS — M1990 Unspecified osteoarthritis, unspecified site: Secondary | ICD-10-CM

## 2014-01-02 DIAGNOSIS — M791 Myalgia, unspecified site: Secondary | ICD-10-CM

## 2014-01-02 DIAGNOSIS — Z794 Long term (current) use of insulin: Secondary | ICD-10-CM

## 2014-01-02 LAB — COMPREHENSIVE METABOLIC PANEL
ALBUMIN: 4.3 g/dL (ref 3.5–5.2)
ALT: 17 U/L (ref 0–35)
AST: 19 U/L (ref 0–37)
Alkaline Phosphatase: 77 U/L (ref 39–117)
BUN: 26 mg/dL — ABNORMAL HIGH (ref 6–23)
CALCIUM: 9.5 mg/dL (ref 8.4–10.5)
CO2: 25 meq/L (ref 19–32)
Chloride: 104 mEq/L (ref 96–112)
Creat: 1.76 mg/dL — ABNORMAL HIGH (ref 0.50–1.10)
GLUCOSE: 185 mg/dL — AB (ref 70–99)
POTASSIUM: 4.5 meq/L (ref 3.5–5.3)
SODIUM: 139 meq/L (ref 135–145)
TOTAL PROTEIN: 7.4 g/dL (ref 6.0–8.3)
Total Bilirubin: 0.3 mg/dL (ref 0.2–1.2)

## 2014-01-02 LAB — SEDIMENTATION RATE: Sed Rate: 38 mm/hr — ABNORMAL HIGH (ref 0–22)

## 2014-01-02 LAB — POCT GLYCOSYLATED HEMOGLOBIN (HGB A1C): Hemoglobin A1C: 10.4

## 2014-01-02 LAB — CK: Total CK: 208 U/L — ABNORMAL HIGH (ref 7–177)

## 2014-01-02 LAB — C-REACTIVE PROTEIN: CRP: 2.1 mg/dL — ABNORMAL HIGH (ref <0.60)

## 2014-01-02 LAB — GLUCOSE, CAPILLARY: GLUCOSE-CAPILLARY: 231 mg/dL — AB (ref 70–99)

## 2014-01-02 MED ORDER — CLONIDINE HCL 0.1 MG/24HR TD PTWK: 0.1000 mg | MEDICATED_PATCH | TRANSDERMAL | Status: DC

## 2014-01-02 MED ORDER — INSULIN GLARGINE 100 UNIT/ML SOLOSTAR PEN: 67.0000 [IU] | PEN_INJECTOR | Freq: Every day | SUBCUTANEOUS | Status: DC

## 2014-01-02 MED ORDER — LOSARTAN POTASSIUM 100 MG PO TABS: 50.0000 mg | ORAL_TABLET | Freq: Every day | ORAL | Status: DC

## 2014-01-02 MED ORDER — HYDROCODONE-ACETAMINOPHEN 5-325 MG PO TABS: 1.0000 | ORAL_TABLET | Freq: Three times a day (TID) | ORAL | Status: DC | PRN

## 2014-01-02 NOTE — Assessment & Plan Note (Signed)
Start clonidine patch (already on gabapentin, SSRI, NDRI, not candidate for HRT given 20 years of symptoms/age).  Decrease losartan to 50mg  (half tab) until follow up, if BP can tolerate, increase losartan back to 100.

## 2014-01-02 NOTE — Patient Instructions (Signed)
General Instructions:  -Regarding Diabetes: increase lantus to 67u before bed, continue novolog 17u three times daily with meals, be sure to travel with something to eat incase your sugar drops, try not to skip meals --> MOST IMPORTANTLY, YOU'RE DOING GREAT! YOU DROPPED 3 POINTS!!  Wait to make changes until after your dental procedure.  -Regarding your arm pain/back pain: discontinue tylenol + tramadol, and restart low dose hydrocodone-acetaminophen three times daily as needed, monitor for belly symptoms that you had before; also use your arm splints on both arms EVERY night at minimum, during the day if tolerated; may also use mineral ice and heat to muscle aches as needed, but NOT AT THE SAME TIME  -Regarding hot flashes, starting clonidine patch 0.1 mg weekly, this is a blood pressure pill that also works for hot flashes, so temporarily half your losartan to 50mg , we will increase in 1 month if needed I would wait to make changes/start new medication until your dental procedure is complete and you are recovering  Please be sure to bring all of your medications with you to every visit.  Should you have any new or worsening symptoms, please be sure to call the clinic at [phone].   Treatment Goals:  Goals (1 Years of Data) as of 01/02/14         As of Today 11/15/13 10/16/13 10/15/13 10/15/13     Blood Pressure    . Blood Pressure < 150/90  123/69 161/89 171/93 128/57 121/81     Result Component    . HEMOGLOBIN A1C < 7.0  10.4        . LDL CALC < 100            Progress Toward Treatment Goals:  Treatment Goal 01/02/2014  Hemoglobin A1C improved  Blood pressure at goal    Self Care Goals & Plans:  Self Care Goal 01/02/2014  Manage my medications take my medicines as prescribed; bring my medications to every visit; refill my medications on time  Monitor my health bring my glucose meter and log to each visit; keep track of my blood glucose; check my feet daily  Eat healthy foods  eat baked foods instead of fried foods; eat foods that are low in salt  Be physically active find an activity I enjoy  Meeting treatment goals maintain the current self-care plan    Home Blood Glucose Monitoring 01/02/2014  Check my blood sugar 3 times a day  When to check my blood sugar before breakfast; at bedtime; before lunch     Care Management & Community Referrals:  Referral 11/15/2013  Referrals made for care management support diabetes educator  Referrals made to community resources weight management; exercise/physical therapy; nutrition

## 2014-01-02 NOTE — Assessment & Plan Note (Signed)
Discontinue tramadol and tylenol, restart hydrocodone-APAP 5-325 TID prn, monitor for abdominal symptoms --if effective, will re-est pain contract

## 2014-01-02 NOTE — Assessment & Plan Note (Signed)
Lab Results  Component Value Date   HGBA1C 10.4 01/02/2014   HGBA1C 13.4 09/12/2013   HGBA1C 7.2 05/23/2013     Assessment: Diabetes control: poor control (HgbA1C >9%) Progress toward A1C goal:  improved  Plan: Medications:  Increase lantus to 67u qHS, continue novolog 17 u TIDWC, gave instructions for decreasing insulin after dental procedure if needed Home glucose monitoring: Frequency: 3 times a day Timing: before breakfast;at bedtime;before lunch Instruction/counseling given: reminded to bring blood glucose meter & log to each visit, reminded to bring medications to each visit, discussed the need for weight loss, discussed diet and discussed sick day management Educational resources provided: brochure Self management tools provided: copy of home glucose meter download;home glucose logbook

## 2014-01-02 NOTE — Assessment & Plan Note (Addendum)
Myalgias of b/l UE today.  At last visit in Feb, ESR was wnl, CRP and CK elevated.  Statin was d/c.    -Recheck ESR, CRP, LFTs, CK -Supportive tx with heat/mineral ice (instructed to avoid both together - skin burning) -b/l wrist splinting -d/c tramadol + tylenol, start norco  ADDENDUM: CK remains elevated (242 --> 208, no longer on statin), but less than before. ESR elevated, but would be normal if corrected for age (ESR 38, so (38+10)/2 = 24, just barely above the upper limit of normal).  CRP elevated.    On review of her labs, she had a mildly elevated RF in 2010, ANA at that time negative.  TSH in 11/2012 was wnl. Vit D in 2013 wnl. ANCA never tested, and she has had chronic abdominal pain without clear etiology (followed by Dr. Cristina Gong here and then referred to [facility]).  This may be an auto-immune process declaring itself.  Will ask patient to return for : TSH, Vit D, ANA, RF, antiCCP, and ANCA.

## 2014-01-02 NOTE — Progress Notes (Signed)
Subjective:   Patient ID: [name] female   DOB: 06-Oct-1951 63 y.o.   MRN: [id number]  Chief Complaint  Patient presents with  . Diabetes  . Arm Pain    pt c/o right arm/hand/wrist pain since last week  . Back Pain    chronic-"getting worse"     HPI: [name] is a 63 y.o. woman with DM, GERD & HTN who presents for DM follow up (I last saw pt on 11/15/13 --> increased lantus to 65qHS and novolog to 17u TIDWC, restarted losartan 100 --> subsequent Cr rechecked --> 1.36, improved)  At last visit, also reported persistent hot flashes x 20 years, has been on gabapentin, SSRI + NDRI, hysterectomy in 1980s, has been on estrogen before, was taken off  DM --> for last two weeks, gave up sugars for lent, eating a lot of celery, carrots, peppers etc, did have craving for doritos, but sugars still fluctuate, avoiding bread, has had a few low CBGs (HHRN has helped her log sugars, lowest 68-73 (because out somewhere without food), appropriately taking lantus Review of meter: avg/day --> 3.2, highest 488, lowest 68; AM CBGs high 100s Plans to have dental work, needs root canal but insurance wouldn't cover, will be pulled out once sugars better controlled, also upper bone swells due to dentures, need CBG < 200  Most recently seen by counselor on 12/04/13 --> her therapist just retired, was recommended to be seen somewhere else, has paperwork for where to go  Right arm pain today, was initially on left arm (started last year) - now over over dorsum of both forearms, sore, worse with moving, has been trouble with wrist and first two fingers, thinks she needs to see her orthopedic MD but doesn't feel like bones, did have sharp pain run through right middle finger once last year. Better with rest/sleep, sometimes better with massage. No change in exercise routine of upper extremities (started walking at track).  Has tried voltaren in left arm, no relief.   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 Has been taking tramadol (1 all day yesterday, feels like it isn't helping) + tylenol (none yesterday) + ibuprofen (four $RemoveBeforeD'200mg'MMWSvCqlGNxARi$  pills yesterday - took 2 pills BID due to dental issues) -- felt that nothing has helped. Has tried tylenol $RemoveBefor'1000mg'JqpfOQMhUFRy$  TID but no improvement of back pain. Can't reach back.    HM: flu, A1c --> declines flu shot, A1c today  Review of Systems: Constitutional: energy ok, stays tired, appetite unchanged. HEENT: Denies photophobia, eye pain, redness, hearing loss, ear pain, congestion, sore throat, rhinorrhea, sneezing, mouth sores, trouble swallowing, neck pain, neck stiffness and tinnitus.  Respiratory: Denies SOB, cough, and wheezing. Burning sensation in chest sometimes when walking. Cardiovascular: Denies palpitations; minimal leg swelling, depending on how long standing - good in AM Gastrointestinal: feels bloated after eating, continues to use laxative before bed Genitourinary: Denies dysuria, urgency, frequency, hematuria, flank pain and difficulty urinating.  Musculoskeletal: chronic back and joint pains Skin: Denies pallor, rash and wound.  Neurological: Denies dizziness, seizures, syncope, weakness, lightheadedness, numbness; chronic HA in back of head - have discussed that this is tension HA    Past Medical History  Diagnosis Date  . Hypertension   . Peripheral neuropathy   . Cervicalgia   . Low back pain syndrome   . Hyperlipidemia   . Vitamin D deficiency   . Vitamin B12 deficiency   . Iliotibial band  syndrome   . Anemia   . Borderline personality disorder   . Nonorganic psychosis   . GERD (gastroesophageal reflux disease)   . Diabetes mellitus   . Depression   . Diabetic gastroparesis   . Sleep apnea   . CAD (coronary artery disease)     Catherization 11/18/09>nonobstructive CAD , normal LV function, EF 65%  . Health maintenance examination     Colonoscopy 2009> normal;  Diabetic eye exam 12/2008. No diabetic retinopathy; Mammogram 11/10> No evidence of malignancy, DXA 03/14/13 : normal  . Headache(784.0)   . Arthritis   . SBO (small bowel obstruction) 01/09/2013  . Diastolic CHF     Grade I, on Echo 06/2013, EF 55-60%   Current Outpatient Prescriptions  Medication Sig Dispense Refill  . ACCU-CHEK FASTCLIX LANCETS MISC 1 each by Does not apply route 3 (three) times daily before meals. Dx code 250.00  102 each  12  . acetaminophen (TYLENOL) 325 MG tablet Take 325-650 mg by mouth every 6 (six) hours as needed for mild pain or headache.      Marland Kitchen aspirin 81 MG chewable tablet Chew 81 mg by mouth daily.      Marland Kitchen buPROPion (WELLBUTRIN XL) 150 MG 24 hr tablet Take 3 tablets (450 mg total) by mouth daily.  90 tablet  5  . dexlansoprazole (DEXILANT) 60 MG capsule Take 60 mg by mouth daily.        Tery Sanfilippo Sodium (COLACE PO) Take 2 tablets by mouth daily as needed (constipation).      Marland Kitchen FLUoxetine (PROZAC) 20 MG capsule Take 1 capsule (20 mg total) by mouth every morning.  30 capsule  5  . furosemide (LASIX) 40 MG tablet Take 1 tablet (40 mg total) by mouth daily.  30 tablet  11  . gabapentin (NEURONTIN) 800 MG tablet Take 800-1,600 mg by mouth 2 (two) times daily. 800 mg in the am and 1600 mg in the evening.      Marland Kitchen glucose blood (ACCU-CHEK SMARTVIEW) test strip Use to check blood sugar before meals dx code 250.00  100 each  12  . glucose monitoring kit (FREESTYLE) monitoring kit 1 each by Does not apply route as needed for other.  1 each  0  . insulin aspart (NOVOLOG FLEXPEN) 100 UNIT/ML FlexPen Inject 17 Units into the skin 3 (three) times daily with meals.  5 pen  12  . Insulin Glargine (LANTUS) 100 UNIT/ML Solostar Pen Inject 67 Units into the skin daily at 10 pm.  15 mL  11  . Insulin Pen Needle 31G X 5 MM MISC Box of 100 - uses up to 5 times per day according to CBG  100 each  11  . isosorbide mononitrate (IMDUR) 60 MG 24 hr tablet Take 1 tablet (60 mg total) by mouth  daily.  30 tablet  6  . losartan (COZAAR) 100 MG tablet Take 1 tablet (100 mg total) by mouth daily.  30 tablet  11  . metoprolol succinate (TOPROL-XL) 25 MG 24 hr tablet Take 1 tablet (25 mg total) by mouth daily.  30 tablet  11  . promethazine (PHENERGAN) 25 MG tablet Take 1 tablet (25 mg total) by mouth every 6 (six) hours as needed for nausea.  20 tablet  1  . senna (SENOKOT) 8.6 MG TABS Take 4 tablets by mouth daily as needed (constipation).      . temazepam (RESTORIL) 15 MG capsule Take 1 capsule (15 mg total) by mouth at bedtime  as needed for sleep.  30 capsule  3  . traMADol (ULTRAM) 50 MG tablet Take 1 tablet (50 mg total) by mouth every 8 (eight) hours as needed.  90 tablet  1   No current facility-administered medications for this visit.   Family History  Problem Relation Age of Onset  . Diabetes Mother   . Hypertension Mother   . Hyperlipidemia Mother   . Arthritis Mother   . Depression Mother   . Heart disease Father   . Diabetes Sister   . Diabetes Brother   . Heart disease Brother   . Heart disease Paternal Grandmother   . Seizures Brother    History   Social History  . Marital Status: Married    Spouse Name: N/A    Number of Children: N/A  . Years of Education: N/A   Social History Main Topics  . Smoking status: Former Smoker    Types: Cigarettes    Quit date: 10/25/2002  . Smokeless tobacco: Not on file  . Alcohol Use: No  . Drug Use: No  . Sexual Activity: Yes   Other Topics Concern  . Not on file   Social History Narrative  . No narrative on file    Objective:  Physical Exam: Filed Vitals:   01/02/14 0840  BP: 123/69  Pulse: 84  Temp: 97.7 F (36.5 C)  TempSrc: Oral  Weight: 211 lb 4.8 oz (95.845 kg)  SpO2: 98%   General: appears to be in good spirits today  HEENT: PERRL, EOMI, no scleral icterus, top dentures in place  Cardiac: RRR, no rubs, murmurs or gallops  Pulm: clear to auscultation bilaterally, moving normal volumes of air    Abd: soft, nontender, nondistended, BS normoactive  Ext: warm and well perfused, b/l dorsal forearms muscles TTP but with good strength and ROM Neuro: alert and oriented X3, cranial nerves II-XII grossly intact  Assessment & Plan:  Case and care discussed with Dr. Ellwood Dense.  Please see problem oriented charting for further details. Patient to return in early May for BP/hot flashes follow up.

## 2014-01-02 NOTE — Assessment & Plan Note (Addendum)
BP Readings from Last 3 Encounters:  01/02/14 123/69  11/15/13 161/89  10/16/13 171/93    Lab Results  Component Value Date   NA 135 11/22/2013   K 3.9 11/22/2013   CREATININE 1.36* 11/22/2013    Assessment: Blood pressure control: controlled Progress toward BP goal:  at goal Comments: Cr 1.5 --> 1.36 after re-initation of ACEI  Plan: Medications:  continue current medications - losartan (decrease from 100-->50 temporarily while starting clonidine patch for hot flashes), metop 25 daily, lasix 40 daily, imdur 60 daily

## 2014-01-02 NOTE — Assessment & Plan Note (Signed)
Off simvastatin given myalgias, repeat blood work today. Restart statin at next visit (consider crestor)

## 2014-01-06 NOTE — Addendum Note (Signed)
Addended by: Belia HemanSHARDA, Jearlean Demauro K on: 01/06/2014 03:36 AM   Modules accepted: Orders

## 2014-01-11 ENCOUNTER — Other Ambulatory Visit: Payer: Self-pay

## 2014-01-16 ENCOUNTER — Encounter: Payer: Self-pay | Admitting: Internal Medicine

## 2014-01-17 ENCOUNTER — Other Ambulatory Visit (INDEPENDENT_AMBULATORY_CARE_PROVIDER_SITE_OTHER): Payer: PRIVATE HEALTH INSURANCE

## 2014-01-17 ENCOUNTER — Encounter: Payer: Self-pay | Admitting: *Deleted

## 2014-01-17 DIAGNOSIS — M791 Myalgia, unspecified site: Secondary | ICD-10-CM

## 2014-01-17 DIAGNOSIS — IMO0001 Reserved for inherently not codable concepts without codable children: Secondary | ICD-10-CM

## 2014-01-17 LAB — RHEUMATOID FACTOR: Rhuematoid fact SerPl-aCnc: <10 IU/mL (ref <=14)

## 2014-01-17 NOTE — Progress Notes (Signed)
Pt here for lab only visit, but wanted to inform MD of the the following.  She forgot to decrease her losartan 100mg  tabs to 50mg  (1/2 tab)daily when she started the clonidine patches for her hot flashes.   Blood pressure at home was 123/60 and I obtained a reading of 118/67 pulse 72 today in clinic.  Pt also stated patches are not working for her hot flashes.  Would like to know what to do about the losartan dose and if any alternatives for hot flashes?  Please advise.Kingsley SpittleGoldston, Eliyah Bazzi Cassady3/26/201511:55 AM

## 2014-01-18 ENCOUNTER — Other Ambulatory Visit: Payer: Self-pay | Admitting: Internal Medicine

## 2014-01-18 DIAGNOSIS — R232 Flushing: Secondary | ICD-10-CM

## 2014-01-18 DIAGNOSIS — I10 Essential (primary) hypertension: Secondary | ICD-10-CM

## 2014-01-18 LAB — CYCLIC CITRUL PEPTIDE ANTIBODY, IGG: Cyclic Citrullin Peptide Ab: <2 U/mL (ref 0.0–5.0)

## 2014-01-18 LAB — TSH: TSH: 2.58 u[IU]/mL (ref 0.350–4.500)

## 2014-01-18 LAB — ANCA SCREEN W REFLEX TITER
Atypical p-ANCA Screen: NEGATIVE
c-ANCA Screen: NEGATIVE
p-ANCA Screen: NEGATIVE

## 2014-01-18 LAB — VITAMIN D 25 HYDROXY (VIT D DEFICIENCY, FRACTURES): VIT D 25 HYDROXY: 28 ng/mL — AB (ref 30–89)

## 2014-01-18 LAB — ANA: Anti Nuclear Antibody(ANA): NEGATIVE

## 2014-01-18 MED ORDER — CLONIDINE HCL 0.2 MG/24HR TD PTWK: 0.2000 mg | MEDICATED_PATCH | TRANSDERMAL | Status: DC

## 2014-01-18 NOTE — Progress Notes (Signed)
Please advise patient to decrease losartan to 0.5 tab and start clonidine 0.2mg  patches (script sent in). Ask her to let us know how she's doing with this medication in about a week after starting.  Thanks!

## 2014-01-18 NOTE — Assessment & Plan Note (Signed)
Pt presented for lab visit on 01/17/14 and wanted to inform MD of the the following: She forgot to decrease her losartan 100mg  tabs to 50mg  (1/2 tab)daily when she started the clonidine patches for her hot flashes. Blood pressure at home was 123/60 and in clinic was 118/67 pulse 72. Pt also stated patches are not working for her hot flashes.   Plan: decrease losartan to 50mg , increase patch to 0.2mg  --> pt to inform us of how she is doing in a week

## 2014-01-18 NOTE — Progress Notes (Signed)
Pt informed and verbalized understanding, phone call complete.Criss AlvineGoldston, Kyndra Condron Cassady3/27/201510:23 AM

## 2014-01-24 ENCOUNTER — Telehealth: Payer: Self-pay | Admitting: Dietician

## 2014-01-24 NOTE — Telephone Encounter (Signed)
Called patient and left message to offer appointment on 02/22/14 same day she sees her doctor

## 2014-01-25 ENCOUNTER — Other Ambulatory Visit (HOSPITAL_COMMUNITY): Payer: Self-pay | Admitting: Psychiatry

## 2014-01-25 DIAGNOSIS — F3289 Other specified depressive episodes: Secondary | ICD-10-CM

## 2014-01-25 DIAGNOSIS — F329 Major depressive disorder, single episode, unspecified: Secondary | ICD-10-CM

## 2014-01-30 ENCOUNTER — Telehealth: Payer: Self-pay | Admitting: *Deleted

## 2014-01-30 ENCOUNTER — Ambulatory Visit (HOSPITAL_COMMUNITY)
Admission: RE | Admit: 2014-01-30 | Discharge: 2014-01-30 | Disposition: A | Discharge status: Home / Self Care | Payer: PRIVATE HEALTH INSURANCE | Source: Ambulatory Visit | Attending: Internal Medicine | Admitting: Internal Medicine

## 2014-01-30 ENCOUNTER — Ambulatory Visit (HOSPITAL_COMMUNITY)
Admission: AD | Admit: 2014-01-30 | Discharge: 2014-01-30 | Disposition: A | Discharge status: Home / Self Care | Payer: PRIVATE HEALTH INSURANCE | Source: Ambulatory Visit | Admitting: Internal Medicine

## 2014-01-30 ENCOUNTER — Encounter (HOSPITAL_COMMUNITY): Payer: Self-pay | Admitting: General Practice

## 2014-01-30 ENCOUNTER — Ambulatory Visit (INDEPENDENT_AMBULATORY_CARE_PROVIDER_SITE_OTHER): Payer: PRIVATE HEALTH INSURANCE | Admitting: Internal Medicine

## 2014-01-30 ENCOUNTER — Observation Stay (HOSPITAL_COMMUNITY)
Admission: AD | Admit: 2014-01-30 | Discharge: 2014-02-02 | Disposition: A | Discharge status: Home / Self Care | Payer: PRIVATE HEALTH INSURANCE | Source: Ambulatory Visit | Attending: Internal Medicine | Admitting: Internal Medicine

## 2014-01-30 ENCOUNTER — Observation Stay (HOSPITAL_COMMUNITY): Payer: PRIVATE HEALTH INSURANCE

## 2014-01-30 ENCOUNTER — Encounter: Payer: Self-pay | Admitting: Internal Medicine

## 2014-01-30 VITALS — BP 173/84 | HR 87 | Temp 97.2°F | Wt 208.0 lb

## 2014-01-30 DIAGNOSIS — E119 Type 2 diabetes mellitus without complications: Secondary | ICD-10-CM

## 2014-01-30 DIAGNOSIS — G609 Hereditary and idiopathic neuropathy, unspecified: Secondary | ICD-10-CM | POA: Diagnosis not present

## 2014-01-30 DIAGNOSIS — F3289 Other specified depressive episodes: Secondary | ICD-10-CM | POA: Insufficient documentation

## 2014-01-30 DIAGNOSIS — F603 Borderline personality disorder: Secondary | ICD-10-CM | POA: Diagnosis not present

## 2014-01-30 DIAGNOSIS — E559 Vitamin D deficiency, unspecified: Secondary | ICD-10-CM | POA: Diagnosis not present

## 2014-01-30 DIAGNOSIS — M549 Dorsalgia, unspecified: Secondary | ICD-10-CM

## 2014-01-30 DIAGNOSIS — I10 Essential (primary) hypertension: Secondary | ICD-10-CM

## 2014-01-30 DIAGNOSIS — I5030 Unspecified diastolic (congestive) heart failure: Secondary | ICD-10-CM | POA: Insufficient documentation

## 2014-01-30 DIAGNOSIS — F341 Dysthymic disorder: Secondary | ICD-10-CM

## 2014-01-30 DIAGNOSIS — K219 Gastro-esophageal reflux disease without esophagitis: Secondary | ICD-10-CM | POA: Insufficient documentation

## 2014-01-30 DIAGNOSIS — E538 Deficiency of other specified B group vitamins: Secondary | ICD-10-CM | POA: Insufficient documentation

## 2014-01-30 DIAGNOSIS — K429 Umbilical hernia without obstruction or gangrene: Secondary | ICD-10-CM | POA: Diagnosis not present

## 2014-01-30 DIAGNOSIS — M545 Low back pain, unspecified: Secondary | ICD-10-CM | POA: Diagnosis not present

## 2014-01-30 DIAGNOSIS — Z87891 Personal history of nicotine dependence: Secondary | ICD-10-CM | POA: Diagnosis not present

## 2014-01-30 DIAGNOSIS — IMO0001 Reserved for inherently not codable concepts without codable children: Secondary | ICD-10-CM | POA: Diagnosis not present

## 2014-01-30 DIAGNOSIS — IMO0002 Reserved for concepts with insufficient information to code with codable children: Secondary | ICD-10-CM

## 2014-01-30 DIAGNOSIS — F329 Major depressive disorder, single episode, unspecified: Secondary | ICD-10-CM | POA: Diagnosis not present

## 2014-01-30 DIAGNOSIS — G4730 Sleep apnea, unspecified: Secondary | ICD-10-CM | POA: Diagnosis not present

## 2014-01-30 DIAGNOSIS — R109 Unspecified abdominal pain: Secondary | ICD-10-CM

## 2014-01-30 DIAGNOSIS — I509 Heart failure, unspecified: Secondary | ICD-10-CM | POA: Diagnosis not present

## 2014-01-30 DIAGNOSIS — N183 Chronic kidney disease, stage 3 unspecified: Secondary | ICD-10-CM | POA: Diagnosis not present

## 2014-01-30 DIAGNOSIS — I129 Hypertensive chronic kidney disease with stage 1 through stage 4 chronic kidney disease, or unspecified chronic kidney disease: Secondary | ICD-10-CM | POA: Diagnosis not present

## 2014-01-30 DIAGNOSIS — Z794 Long term (current) use of insulin: Secondary | ICD-10-CM | POA: Diagnosis not present

## 2014-01-30 DIAGNOSIS — F411 Generalized anxiety disorder: Secondary | ICD-10-CM | POA: Insufficient documentation

## 2014-01-30 DIAGNOSIS — L299 Pruritus, unspecified: Secondary | ICD-10-CM | POA: Diagnosis not present

## 2014-01-30 DIAGNOSIS — E785 Hyperlipidemia, unspecified: Secondary | ICD-10-CM | POA: Diagnosis not present

## 2014-01-30 DIAGNOSIS — Z9049 Acquired absence of other specified parts of digestive tract: Secondary | ICD-10-CM | POA: Insufficient documentation

## 2014-01-30 DIAGNOSIS — R51 Headache: Secondary | ICD-10-CM | POA: Insufficient documentation

## 2014-01-30 DIAGNOSIS — I2510 Atherosclerotic heart disease of native coronary artery without angina pectoris: Secondary | ICD-10-CM

## 2014-01-30 DIAGNOSIS — R079 Chest pain, unspecified: Secondary | ICD-10-CM | POA: Diagnosis not present

## 2014-01-30 DIAGNOSIS — R1013 Epigastric pain: Secondary | ICD-10-CM

## 2014-01-30 DIAGNOSIS — G8929 Other chronic pain: Secondary | ICD-10-CM | POA: Diagnosis not present

## 2014-01-30 DIAGNOSIS — M542 Cervicalgia: Secondary | ICD-10-CM | POA: Insufficient documentation

## 2014-01-30 DIAGNOSIS — M1990 Unspecified osteoarthritis, unspecified site: Secondary | ICD-10-CM

## 2014-01-30 DIAGNOSIS — N179 Acute kidney failure, unspecified: Secondary | ICD-10-CM | POA: Diagnosis present

## 2014-01-30 DIAGNOSIS — E1165 Type 2 diabetes mellitus with hyperglycemia: Secondary | ICD-10-CM

## 2014-01-30 LAB — CBC WITH DIFFERENTIAL/PLATELET
BASOS ABS: 0 10*3/uL (ref 0.0–0.1)
Basophils Relative: 0 % (ref 0–1)
Eosinophils Absolute: 0.3 10*3/uL (ref 0.0–0.7)
Eosinophils Relative: 4 % (ref 0–5)
HCT: 34.2 % — ABNORMAL LOW (ref 36.0–46.0)
HEMOGLOBIN: 11.2 g/dL — AB (ref 12.0–15.0)
LYMPHS PCT: 39 % (ref 12–46)
Lymphs Abs: 2.7 10*3/uL (ref 0.7–4.0)
MCH: 24.5 pg — ABNORMAL LOW (ref 26.0–34.0)
MCHC: 32.7 g/dL (ref 30.0–36.0)
MCV: 74.7 fL — ABNORMAL LOW (ref 78.0–100.0)
MONOS PCT: 5 % (ref 3–12)
Monocytes Absolute: 0.3 10*3/uL (ref 0.1–1.0)
NEUTROS ABS: 3.6 10*3/uL (ref 1.7–7.7)
Neutrophils Relative %: 52 % (ref 43–77)
Platelets: 270 10*3/uL (ref 150–400)
RBC: 4.58 MIL/uL (ref 3.87–5.11)
RDW: 16.2 % — ABNORMAL HIGH (ref 11.5–15.5)
WBC: 6.9 10*3/uL (ref 4.0–10.5)

## 2014-01-30 LAB — URINE MICROSCOPIC-ADD ON

## 2014-01-30 LAB — URINALYSIS, ROUTINE W REFLEX MICROSCOPIC
Bilirubin Urine: NEGATIVE
GLUCOSE, UA: NEGATIVE mg/dL
Hgb urine dipstick: NEGATIVE
KETONES UR: NEGATIVE mg/dL
Leukocytes, UA: NEGATIVE
Nitrite: NEGATIVE
PH: 5.5 (ref 5.0–8.0)
PROTEIN: 30 mg/dL — AB
Specific Gravity, Urine: 1.022 (ref 1.005–1.030)
Urobilinogen, UA: 0.2 mg/dL (ref 0.0–1.0)

## 2014-01-30 LAB — COMPLETE METABOLIC PANEL WITH GFR
ALBUMIN: 3.6 g/dL (ref 3.5–5.2)
ALT: 15 U/L (ref 0–35)
AST: 16 U/L (ref 0–37)
Alkaline Phosphatase: 92 U/L (ref 39–117)
BUN: 22 mg/dL (ref 6–23)
CALCIUM: 9.9 mg/dL (ref 8.4–10.5)
CHLORIDE: 104 meq/L (ref 96–112)
CO2: 22 mEq/L (ref 19–32)
Creat: 1.17 mg/dL — ABNORMAL HIGH (ref 0.50–1.10)
GFR, Est African American: 57 mL/min — ABNORMAL LOW
GFR, Est Non African American: 50 mL/min — ABNORMAL LOW
GLUCOSE: 206 mg/dL — AB (ref 70–99)
Potassium: 5.2 mEq/L (ref 3.5–5.3)
SODIUM: 140 meq/L (ref 135–145)
Total Bilirubin: <0.2 mg/dL — ABNORMAL LOW (ref 0.3–1.2)
Total Protein: 8.1 g/dL (ref 6.0–8.3)

## 2014-01-30 LAB — GLUCOSE, CAPILLARY
GLUCOSE-CAPILLARY: 191 mg/dL — AB (ref 70–99)
GLUCOSE-CAPILLARY: 146 mg/dL — AB (ref 70–99)
Glucose-Capillary: 115 mg/dL — ABNORMAL HIGH (ref 70–99)

## 2014-01-30 LAB — TROPONIN I
Troponin I: <0.3 ng/mL (ref <0.30)
Troponin I: <0.3 ng/mL (ref <0.30)

## 2014-01-30 LAB — LIPASE: LIPASE: 66 U/L — AB (ref 11–59)

## 2014-01-30 MED ORDER — ONDANSETRON HCL 4 MG/2ML IJ SOLN
4.0000 mg | Freq: Four times a day (QID) | INTRAMUSCULAR | Status: DC | PRN
  Administered 2014-01-31 – 2014-02-01 (×2): 4 mg via INTRAVENOUS
  Filled 2014-01-30 (×2): qty 2

## 2014-01-30 MED ORDER — ACETAMINOPHEN 325 MG PO TABS
650.0000 mg | ORAL_TABLET | Freq: Three times a day (TID) | ORAL | Status: DC | PRN
  Administered 2014-01-30: 650 mg via ORAL
  Filled 2014-01-30: qty 2

## 2014-01-30 MED ORDER — SODIUM CHLORIDE 0.9 % IJ SOLN
3.0000 mL | Freq: Two times a day (BID) | INTRAMUSCULAR | Status: DC
  Administered 2014-01-31 – 2014-02-02 (×4): 3 mL via INTRAVENOUS

## 2014-01-30 MED ORDER — MORPHINE SULFATE 2 MG/ML IJ SOLN
1.0000 mg | INTRAMUSCULAR | Status: DC | PRN
  Administered 2014-01-30 – 2014-01-31 (×3): 2 mg via INTRAVENOUS
  Filled 2014-01-30 (×3): qty 1

## 2014-01-30 MED ORDER — CLONIDINE HCL 0.2 MG/24HR TD PTWK
0.2000 mg | MEDICATED_PATCH | TRANSDERMAL | Status: DC
  Administered 2014-01-30: 0.2 mg via TRANSDERMAL
  Filled 2014-01-30: qty 1

## 2014-01-30 MED ORDER — INSULIN ASPART 100 UNIT/ML ~~LOC~~ SOLN
0.0000 [IU] | SUBCUTANEOUS | Status: DC
  Administered 2014-01-30 – 2014-01-31 (×3): 2 [IU] via SUBCUTANEOUS
  Administered 2014-01-31: 3 [IU] via SUBCUTANEOUS
  Administered 2014-01-31 – 2014-02-01 (×3): 2 [IU] via SUBCUTANEOUS

## 2014-01-30 MED ORDER — SODIUM CHLORIDE 0.9 % IV SOLN
INTRAVENOUS | Status: DC
Start: 2014-01-30 — End: 2014-01-31
  Administered 2014-01-30: 125 mL/h via INTRAVENOUS
  Administered 2014-01-30 – 2014-01-31 (×2): via INTRAVENOUS

## 2014-01-30 MED ORDER — ONDANSETRON HCL 4 MG PO TABS: 4.0000 mg | ORAL_TABLET | Freq: Four times a day (QID) | ORAL | Status: DC | PRN

## 2014-01-30 MED ORDER — IOHEXOL 300 MG/ML  SOLN
25.0000 mL | INTRAMUSCULAR | Status: AC
  Administered 2014-01-30 (×2): 25 mL via ORAL
  Filled 2014-01-30: qty 25

## 2014-01-30 MED ORDER — HYDRALAZINE HCL 20 MG/ML IJ SOLN: 5.0000 mg | INTRAMUSCULAR | Status: DC | PRN

## 2014-01-30 MED ORDER — ENOXAPARIN SODIUM 40 MG/0.4ML ~~LOC~~ SOLN
40.0000 mg | SUBCUTANEOUS | Status: DC
  Administered 2014-01-30 – 2014-02-01 (×3): 40 mg via SUBCUTANEOUS
  Filled 2014-01-30 (×4): qty 0.4

## 2014-01-30 MED ORDER — PANTOPRAZOLE SODIUM 40 MG IV SOLR
40.0000 mg | Freq: Every day | INTRAVENOUS | Status: DC
  Administered 2014-01-30 – 2014-02-01 (×3): 40 mg via INTRAVENOUS
  Filled 2014-01-30 (×4): qty 40

## 2014-01-30 MED ORDER — IOHEXOL 300 MG/ML  SOLN
80.0000 mL | Freq: Once | INTRAMUSCULAR | Status: AC | PRN
  Administered 2014-01-30: 80 mL via INTRAVENOUS

## 2014-01-30 MED ORDER — ACETAMINOPHEN 650 MG RE SUPP: 650.0000 mg | Freq: Three times a day (TID) | RECTAL | Status: DC | PRN

## 2014-01-30 NOTE — H&P (Signed)
Date: 01/30/2014               Patient Name:  [name] MRN: [id number]  DOB: Sep 11, 1951 Age / Sex: 63 y.o., female   PCP: Othella Boyer, MD              Medical Service: Internal Medicine Teaching Service              Attending Physician: Dr. Axel Filler, MD    First Contact: Durene Fruits, MS 3 Pager: [phone]  Second Contact: Dr. Mechele Claude Pager: [phone]  Third Contact Dr. Eulas Post Pager: [phone]       After Hours (After 5p/  First Contact Pager: [phone]  weekends / holidays): Second Contact Pager: [phone]   Chief Complaint: Severe abdominal Pain  History of Present Illness:  [name] is a 63 y/o female complaining of severe abdominal pain with a past medical history significant for hypertension, coronary artery disease, DM2 poorly controled, CKD stage 2, history of bowel obstruction status post laparotomy with the lysis of adhesions, and small bowel resection in March 2014.    Mrs. Esquilin reports that on Monday she felt well before going to bed. She was awakened that night at 1am with a severe abdominal pain that she describes as sharp and predominantly located in the middle of her stomach, but motions that the pain is also diffuse. After being unable to go to sleep due to discomfort she was able to "doze off" at around 6am and awake at 12:30. She then attempted to eat lunch and after a few bites she felt the pain and discomfort again and decided to go back to sleep. She has not had any food since then and reports feeling sleepy all day yesterday and today. She states that she has had a bowel movement today which was regular for her and non-bloody. She also endorses nausea, sweating, and feeling overall warmer but did not have a thermometer at home to measure her temperature. She denies vomiting, diarrhea, constipation, dysuria, shortness of breath, or dizziness.    [name] also complains of a diffuse headache, back pain, and an achy chest pain that began yesterday. She  describes her headache as being located in the front and back on both sides and that it feels like there is tension. She also says that lights make it worse. She denies an aura before the headache and reports that she does not typically have headaches. The chest pain which started yesterday morning she describes as achy pain which she states is in her central in her upper chest. She denies chills and shortness of breath, but does endorse that her left hand has numbness which she states is not a new symptom. Her back pain has been an ongoing problem, but she feels that her pain has been more severe since the abdominal pain began.   Meds: . cloNIDine  0.2 mg Transdermal Q7 days  . enoxaparin (LOVENOX) injection  40 mg Subcutaneous Q24H  . insulin aspart  0-15 Units Subcutaneous 6 times per day  . sodium chloride  3 mL Intravenous Q12H   . sodium chloride     hydrALAZINE, morphine injection, ondansetron (ZOFRAN) IV, ondansetron  Allergies: Allergies as of 01/30/2014 - Review Complete 01/30/2014  Allergen Reaction Noted  . Indomethacin Diarrhea 03/11/2011  . Penicillins Hives   . Cimetidine    . Sulfonamide derivatives     Past Medical History  Diagnosis Date  . Hypertension   . Peripheral neuropathy   .  Cervicalgia   . Low back pain syndrome   . Hyperlipidemia   . Vitamin D deficiency   . Vitamin B12 deficiency   . Iliotibial band syndrome   . Anemia   . Borderline personality disorder   . Nonorganic psychosis   . GERD (gastroesophageal reflux disease)   . Diabetes mellitus   . Depression   . Diabetic gastroparesis   . Sleep apnea   . CAD (coronary artery disease)     Catherization 11/18/09>nonobstructive CAD , normal LV function, EF 65%  . Health maintenance examination     Colonoscopy 2009> normal; Diabetic eye exam 12/2008. No diabetic retinopathy; Mammogram 11/10> No evidence of malignancy, DXA 03/14/13 : normal  . Headache(784.0)   . Arthritis   . SBO (small bowel  obstruction) 01/09/2013  . Diastolic CHF     Grade I, on Echo 06/2013, EF 55-60%   Past Surgical History  Procedure Laterality Date  . Abdominal hysterectomy    . Hand surgery    . Bowel resection N/A 01/10/2013    Procedure: SMALL BOWEL RESECTION;  Surgeon: Madilyn Hook, DO;  Location: [facility];  Service: General;  Laterality: N/A;  . Lysis of adhesion N/A 01/10/2013    Procedure: LYSIS OF ADHESION;  Surgeon: Madilyn Hook, DO;  Location: [facility];  Service: General;  Laterality: N/A;  . Laparotomy N/A 01/10/2013    Procedure: EXPLORATORY LAPAROTOMY;  Surgeon: Madilyn Hook, DO;  Location: [facility];  Service: General;  Laterality: N/A;   Family History  Problem Relation Age of Onset  . Diabetes Mother   . Hypertension Mother   . Hyperlipidemia Mother   . Arthritis Mother   . Depression Mother   . Heart disease Father   . Diabetes Sister   . Diabetes Brother   . Heart disease Brother   . Heart disease Paternal Grandmother   . Seizures Brother    History   Social History  . Marital Status: Married    Spouse Name: N/A    Number of Children: N/A  . Years of Education: N/A   Occupational History  . Not on file.   Social History Main Topics  . Smoking status: Former Smoker    Types: Cigarettes    Quit date: 10/25/2002  . Smokeless tobacco: Not on file  . Alcohol Use: No  . Drug Use: No  . Sexual Activity: Yes   Other Topics Concern  . Not on file   Social History Narrative  . No narrative on file    Review of Systems: Pertinent items are noted in HPI. Complete review of symptoms was not obtained as the patient was in distress from abdominal pain and sleepy.  Physical Exam: Blood pressure 158/72, pulse 71, temperature 98.7 F (37.1 C), temperature source Oral, resp. rate 18, height _0  (1.651 m), weight 92.987 kg (205 lb), SpO2 96.00%. Physical Exam: General: Resting in a dark room on exam table in supine position on right side in acute distress HEENT: PEERL, MMM, no  scleral icterus, small non-erythematous non-inflamed lesion on the top of her mouth Neck: Supple, no lymphadenopathy, no tenderness, no wheezes, rales, or rhonchi.  Cardio: RRR, No M/R/G, no carotid bruits or JVD Pulm: Lungs clear to auscultation bilaterally Abdominal: Soft, mildly distended, diffusely tender abdomen that was significantly more tender in the epigastric and periumbilical region, no guarding or rebound tenderness, normal abdominal bowel sounds GU: No CVA tenderness Extremities: 2+ radial pulses, no edema Neuro: Alert and Oriented x3, Normal Cranial nerves  II-XII  Lab results: Results for orders placed in visit on 01/30/14 (from the past 24 hour(s))  GLUCOSE, CAPILLARY     Status: Abnormal   Collection Time    01/30/14 11:20 AM      Result Value Ref Range   Glucose-Capillary 191 (*) 70 - 99 mg/dL  CBC WITH DIFFERENTIAL     Status: Abnormal   Collection Time    01/30/14 12:48 PM      Result Value Ref Range   WBC 6.9  4.0 - 10.5 K/uL   RBC 4.58  3.87 - 5.11 MIL/uL   Hemoglobin 11.2 (*) 12.0 - 15.0 g/dL   HCT 34.2 (*) 36.0 - 46.0 %   MCV 74.7 (*) 78.0 - 100.0 fL   MCH 24.5 (*) 26.0 - 34.0 pg   MCHC 32.7  30.0 - 36.0 g/dL   RDW 16.2 (*) 11.5 - 15.5 %   Platelets 270  150 - 400 K/uL   Neutrophils Relative % 52  43 - 77 %   Neutro Abs 3.6  1.7 - 7.7 K/uL   Lymphocytes Relative 39  12 - 46 %   Lymphs Abs 2.7  0.7 - 4.0 K/uL   Monocytes Relative 5  3 - 12 %   Monocytes Absolute 0.3  0.1 - 1.0 K/uL   Eosinophils Relative 4  0 - 5 %   Eosinophils Absolute 0.3  0.0 - 0.7 K/uL   Basophils Relative 0  0 - 1 %   Basophils Absolute 0.0  0.0 - 0.1 K/uL   Smear Review Criteria for review not met     Narrative:    Performed at:  [facility]                [place], [place] [address]  COMPLETE METABOLIC PANEL WITH GFR     Status: Abnormal   Collection Time    01/30/14 12:48 PM      Result Value Ref Range   Sodium 140  135 - 145 mEq/L    Potassium 5.2  3.5 - 5.3 mEq/L   Chloride 104  96 - 112 mEq/L   CO2 22  19 - 32 mEq/L   Glucose, Bld 206 (*) 70 - 99 mg/dL   BUN 22  6 - 23 mg/dL   Creat 1.17 (*) 0.50 - 1.10 mg/dL   Total Bilirubin <0.2 (*) 0.3 - 1.2 mg/dL   Alkaline Phosphatase 92  39 - 117 U/L   AST 16  0 - 37 U/L   ALT 15  0 - 35 U/L   Total Protein 8.1  6.0 - 8.3 g/dL   Albumin 3.6  3.5 - 5.2 g/dL   Calcium 9.9  8.4 - 10.5 mg/dL   GFR, Est African American 57 (*)    GFR, Est Non African American 50 (*)    Narrative:    Performed at:  [facility]                [place], [place] [address]  LIPASE     Status: Abnormal   Collection Time    01/30/14 12:48 PM      Result Value Ref Range   Lipase 66 (*) 11 - 59 U/L   Narrative:  Performed at:  [facility]                [place], [place] [address]  TROPONIN I     Status: None   Collection Time    01/30/14  3:16 PM      Result Value Ref Range   Troponin I <0.30  <0.30 ng/mL   Narrative:    Performed at:  [facility]                [place],  [address]    Imaging results:  Dg Chest 2 View  01/30/2014   CLINICAL DATA:  abdominal pain, R/O PNA  EXAM: CHEST  2 VIEW  COMPARISON:  DG CHEST 2 VIEW dated 08/08/2013  FINDINGS: The heart size and mediastinal contours are within normal limits. Both lungs are clear. The visualized skeletal structures are unremarkable.  IMPRESSION: No active cardiopulmonary disease.   Electronically Signed   By: Margaree Mackintosh M.D.   On: 01/30/2014 16:29   Dg Abd 2 Views  01/30/2014   CLINICAL DATA:  Abdominal pain.  EXAM: ABDOMEN - 2 VIEW  COMPARISON:  CT abdomen and pelvis 08/08/2013. Plain films the abdomen 01/13/2013.  FINDINGS: No free intraperitoneal air is identified. The bowel gas pattern is normal. Injection granulomata in the buttocks are identified as on CT scan. No focal bony abnormality is  identified.  IMPRESSION: Negative exam.   Electronically Signed   By: Inge Rise M.D.   On: 01/30/2014 16:26    Other results: EKG: Pending  Abdominal 2 view X-Ray - No free intraperitoneal air is identified. The bowel gas pattern is normal. Injection granulomata in the buttocks are identified as on CT scan. No focal bony abnormality is identified.  Assessment & Plan by Problem: Mrs. Esperanza is a 63 y/o woman with a chief complaint of severe abdominal pain and past medical history significant for hypertension, coronary artery disease, poorly controlled diabetes, chronic kidney disease stage II, history of bowel obstruction status post laparotomy with the lysis of adhesions, small bowel resection in march 2014.  #Acute Abdominal Pain: Patient has severe diffuse abdominal pain which is more tender in the epigastric/periumbilical region with no fever or constipation. Abdominal X-ray shows no free intraperitoneal air or bowel obstruction making SBO obstruction less likely. Ruptured AAA contained by a hematoma would be most problematic, patient has severe abdominal and back pain and has decreased H/H since last visit in 2014 with low MCV which would indicate an iron deficiency or some type of acute blood loss, risk factors for AAA would include HTN, hyperlipidemia, diabetes. Atypical presentation of MI unlikely due to normal troponin levels, but would need serial troponin levels and 12-lead EKG results, has risk factors including HTN, hyperlipidemia, diabetes, CAD, CHF, and family history of MI and heart disease. Appendicitis is a possibility with with the location of the pain and the acuteness, no increase in WBC. LFTs are within a normal range and pain is not located in the RUQ making a infection or disease per liver or gallbladder etiology unlikely. Lipase levels are slightly elevated, but there is no pain which radiates to the back and patient denies alcohol use for many years and does not have signs of  gallstone obstruction via  LFTs making acute pancreatitis less likely. Chronic pancreatitis unlikely due to no calcifications seen on abdominal x-ray and from lack of alcohol use. UTI unlikely due to clinical exam and ROS, will obtain an UA.  - CT with contrast to rule out AAA, Appendicitis, and Pancratitis (Speak with nephrology first about whether or not patient can have contrast) - Continue to trend troponin levels x3 Q6h - Fecal occult blood test - CBC w/diff daily - CMP daily - UA to rule out cystitis, UTI - Start morphine PRN Q4h for pain  - Start Telemetry for 24 hours to monitor for abnormal tracings  - 12-lead EKG reading - Zofran PRN for nausea/vomiting Q4hrs  #Headache: Diffuse tension headache located bilaterally in the frontal and occipital areas.  - Morphine PRN - Consider tylenol when able to tolerate PO   #Hypertension: Patient has uncontrolled hypertension and has had elevated BP readings since admission.  - Continue clonidine patch - Hydralazine PRN for SBP > 170 or DBP >110. Hold if HR > 95.   #Diabetes - uncontrolled: Patient has had blood sugars ranging from 250 to 59 last week. Previous A1c of 10.4.  - SSI, sensitive  - CBG monitoring Q4 hours  #Chronic Kidney Disease Stage 3: Serum creatinine at 1.17, which is less than her typical baseline which is typically ~ 1.5 - Avoid nephrotoxic medications - Trend serum creatinine daily   #DVT PPX: - Lovenox  #Dispo: Disposition is deferred at this time, awaiting improvement of current medical problems. Anticipated discharge in approximately 1-3 day(s).   This is a Careers information officer Note.  The care of the patient was discussed with Dr. Mechele Claude and the assessment and plan was formulated with their assistance.  Please see their note for official documentation of the patient encounter.   Signed: Durene Fruits, Med Student 01/30/2014, 4:55 PM

## 2014-01-30 NOTE — Addendum Note (Signed)
Addended by: Remus BlakeBARROW, Sharmila Wrobleski K on: 01/30/2014 03:15 PM   Modules accepted: Orders

## 2014-01-30 NOTE — H&P (Signed)
Date: 01/30/2014               Patient Name:  [name] MRN: [id number]  DOB: [birth year] Age / Sex: 63 y.o., female   PCP: Belia Heman, MD         Medical Service: Internal Medicine Teaching Service         Attending Physician: Dr. Farley Ly, MD    First Contact: Dr. Darci Needle Pager: [phone]  Second Contact: Dr. Sherrine Maples Pager: [phone]       After Hours (After 5p/  First Contact Pager: [phone]  weekends / holidays): Second Contact Pager: [phone]   Chief Complaint: abdominal pain   History of Present Illness:   Mrs. Sharea Guinther is 63yo F w/ PMH HTN, CAD, DM2, CKD stage 2, dCHF, GERD, SBO s/p exlap w/ bowel resection 12/2012 who presents with epigastric abd pain. Patient at a hamburger 2 nights ago and was awoken by sharp epigastric pain at around 1am that night. Pain radiates around R side into her back. No N/V/D, F/C, dysuria, lightheadedness, SOB, hematochezia.  Last normal bowel movement was yesterday.   Patient had normal colonoscopy 2009.   Meds: Medications Prior to Admission  Medication Sig Dispense Refill  . aspirin 81 MG chewable tablet Chew 81 mg by mouth daily.      Marland Kitchen buPROPion (WELLBUTRIN XL) 150 MG 24 hr tablet Take 3 tablets (450 mg total) by mouth daily.  90 tablet  5  . cloNIDine (CATAPRES - DOSED IN MG/24 HR) 0.2 mg/24hr patch Place 1 patch (0.2 mg total) onto the skin every 7 (seven) days.  4 patch  0  . dexlansoprazole (DEXILANT) 60 MG capsule Take 60 mg by mouth daily.        Tery Sanfilippo Sodium (COLACE PO) Take 2 tablets by mouth daily as needed (constipation).      Marland Kitchen FLUoxetine (PROZAC) 20 MG capsule Take 1 capsule (20 mg total) by mouth every morning.  30 capsule  5  . furosemide (LASIX) 40 MG tablet Take 1 tablet (40 mg total) by mouth daily.  30 tablet  11  . gabapentin (NEURONTIN) 800 MG tablet Take 800-1,600 mg by mouth 2 (two) times daily. 800 mg in the am and 1600 mg in the evening.      Marland Kitchen HYDROcodone-acetaminophen (NORCO/VICODIN) 5-325 MG  per tablet Take 1 tablet by mouth 3 (three) times daily as needed for moderate pain.  90 tablet  0  . insulin aspart (NOVOLOG FLEXPEN) 100 UNIT/ML FlexPen Inject 17 Units into the skin 3 (three) times daily with meals.  5 pen  12  . Insulin Glargine (LANTUS) 100 UNIT/ML Solostar Pen Inject 67 Units into the skin daily at 10 pm.  15 mL  11  . isosorbide mononitrate (IMDUR) 60 MG 24 hr tablet Take 1 tablet (60 mg total) by mouth daily.  30 tablet  6  . losartan (COZAAR) 100 MG tablet Take 0.5 tablets (50 mg total) by mouth daily.  30 tablet  11  . metoprolol succinate (TOPROL-XL) 25 MG 24 hr tablet Take 1 tablet (25 mg total) by mouth daily.  30 tablet  11  . promethazine (PHENERGAN) 25 MG tablet Take 1 tablet (25 mg total) by mouth every 6 (six) hours as needed for nausea.  20 tablet  1  . senna (SENOKOT) 8.6 MG TABS Take 4 tablets by mouth daily as needed (constipation).      . temazepam (RESTORIL) 15 MG capsule take 1 capsule by  mouth at bedtime if needed for sleep  30 capsule  0    Allergies: Allergies as of 01/30/2014 - Review Complete 01/30/2014  Allergen Reaction Noted  . Cimetidine Other (See Comments)   . Indomethacin Diarrhea 03/11/2011  . Penicillins Hives and Itching   . Indomethacin Other (See Comments) 01/30/2014  . Sulfonamide derivatives     Past Medical History  Diagnosis Date  . Hypertension   . Peripheral neuropathy   . Cervicalgia   . Low back pain syndrome   . Hyperlipidemia   . Vitamin D deficiency   . Vitamin B12 deficiency   . Iliotibial band syndrome   . Anemia   . Borderline personality disorder   . Nonorganic psychosis   . GERD (gastroesophageal reflux disease)   . Diabetes mellitus   . Depression   . Diabetic gastroparesis   . Sleep apnea   . CAD (coronary artery disease)     Catherization 11/18/09>nonobstructive CAD , normal LV function, EF 65%  . Health maintenance examination     Colonoscopy 2009> normal; Diabetic eye exam 12/2008. No diabetic  retinopathy; Mammogram 11/10> No evidence of malignancy, DXA 03/14/13 : normal  . Headache(784.0)   . Arthritis   . SBO (small bowel obstruction) 01/09/2013  . Diastolic CHF     Grade I, on Echo 06/2013, EF 55-60%   Past Surgical History  Procedure Laterality Date  . Abdominal hysterectomy    . Hand surgery    . Bowel resection N/A 01/10/2013    Procedure: SMALL BOWEL RESECTION;  Surgeon: Lodema Pilot, DO;  Location: [facility] OR;  Service: General;  Laterality: N/A;  . Lysis of adhesion N/A 01/10/2013    Procedure: LYSIS OF ADHESION;  Surgeon: Lodema Pilot, DO;  Location: [facility] OR;  Service: General;  Laterality: N/A;  . Laparotomy N/A 01/10/2013    Procedure: EXPLORATORY LAPAROTOMY;  Surgeon: Lodema Pilot, DO;  Location: [facility] OR;  Service: General;  Laterality: N/A;   Family History  Problem Relation Age of Onset  . Diabetes Mother   . Hypertension Mother   . Hyperlipidemia Mother   . Arthritis Mother   . Depression Mother   . Heart disease Father   . Diabetes Sister   . Diabetes Brother   . Heart disease Brother   . Heart disease Paternal Grandmother   . Seizures Brother    History   Social History  . Marital Status: Married    Spouse Name: N/A    Number of Children: N/A  . Years of Education: N/A   Occupational History  . Not on file.   Social History Main Topics  . Smoking status: Former Smoker    Types: Cigarettes    Quit date: 10/25/2002  . Smokeless tobacco: Never Used  . Alcohol Use: No  . Drug Use: No  . Sexual Activity: Yes   Other Topics Concern  . Not on file   Social History Narrative  . No narrative on file    Review of Systems: General: no fevers, chills Skin: no rash HEENT: +HA, no vision changes, ST Pulm: no dyspnea, coughing CV: chronic chest pain Abd: see HPI GU: no dysuria, hematuria, polyuria Ext: +back pain, chronic  Neuro: no weakness, numbness, or tingling  Physical Exam: Blood pressure 158/86, pulse 71, temperature 98.7 F (37.1 C),  temperature source Oral, resp. rate 18, height 5\' 5"  (1.651 m), weight 205 lb (92.987 kg), SpO2 96.00%. General: alert, cooperative, drowsy appearing HEENT: pupils equal round and reactive to light, vision  grossly intact, oropharynx clear and non-erythematous, MMM  Neck: supple Lungs: CTAB Heart: regular rate and rhythm, no murmurs, gallops, or rubs Abdomen: soft, TTP to epigastrium and periumbilical region, non-distended, decreased bowel sounds; no rebound/guarding  Extremities: warm, no pedal edema Neurologic: alert & oriented X3, cranial nerves II-XII grossly intact, strength grossly intact, sensation intact to light touch  Lab results: Basic Metabolic Panel:  Recent Labs  09/81/19 1248  NA 140  K 5.2  CL 104  CO2 22  GLUCOSE 206*  BUN 22  CREATININE 1.17*  CALCIUM 9.9   Liver Function Tests:  Recent Labs  01/30/14 1248  AST 16  ALT 15  ALKPHOS 92  BILITOT <0.2*  PROT 8.1  ALBUMIN 3.6    Recent Labs  01/30/14 1248  LIPASE 66*   CBC:  Recent Labs  01/30/14 1248  WBC 6.9  NEUTROABS 3.6  HGB 11.2*  HCT 34.2*  MCV 74.7*  PLT 270   Cardiac Enzymes:  Recent Labs  01/30/14 1516  TROPONINI <0.30   CBG:  Recent Labs  01/30/14 1120 01/30/14 1756  GLUCAP 191* 146*   Urinalysis:  Recent Labs  01/30/14 1807  COLORURINE YELLOW  LABSPEC 1.022  PHURINE 5.5  GLUCOSEU NEGATIVE  HGBUR NEGATIVE  BILIRUBINUR NEGATIVE  KETONESUR NEGATIVE  PROTEINUR 30*  UROBILINOGEN 0.2  NITRITE NEGATIVE  LEUKOCYTESUR NEGATIVE   Imaging results:  Dg Chest 2 View  01/30/2014   CLINICAL DATA:  abdominal pain, R/O PNA  EXAM: CHEST  2 VIEW  COMPARISON:  DG CHEST 2 VIEW dated 08/08/2013  FINDINGS: The heart size and mediastinal contours are within normal limits. Both lungs are clear. The visualized skeletal structures are unremarkable.  IMPRESSION: No active cardiopulmonary disease.   Electronically Signed   By: Salome Holmes M.D.   On: 01/30/2014 16:29   Dg Abd 2  Views  01/30/2014   CLINICAL DATA:  Abdominal pain.  EXAM: ABDOMEN - 2 VIEW  COMPARISON:  CT abdomen and pelvis 08/08/2013. Plain films the abdomen 01/13/2013.  FINDINGS: No free intraperitoneal air is identified. The bowel gas pattern is normal. Injection granulomata in the buttocks are identified as on CT scan. No focal bony abnormality is identified.  IMPRESSION: Negative exam.   Electronically Signed   By: Drusilla Kanner M.D.   On: 01/30/2014 16:26    Other results: EKG: NSR, unchanged from prior   Assessment & Plan by Problem:  # Epigastric abd pain: Patient presents with sharp constant abd pain w/ radiation to L back. VSS, afebrile. No leukocytosis. Labs fairly unremarkable, normal LFTs. Patient denies passing flatus since yesterday, but did have a bowel movement this morning. Acute abdominal series negative for signs of obstruction or perforation, so SOB less likely but patient does have hx of prior SBO so partial SBO cannot be ruled out per se. Patient's lipase also slightly elevated at 66, so acute pancreatitis is possible, though less likely given no N/V. Of note, lipase was also elevated in November 2014 at 75. No hx of heavy alcohol use. UA negative and no CVAT on exam, so no concern for UTI/pyelonephritis at this time. She does have many RFs for ACS, so will need to rule out. Troponin negative x 1. Patient does have abd pain w/ radiation to back, so aortic dissection or AAA is possible, though pt is only slightly hypertensive and blood pressure in both arms is equal. Pt reports h/o appendectomy and hysterectomy. Still has gall bladder, so pain possibly related to gall bladder (  cholecystitis vs choledocholithiasis).  We will obtain CT abd/pelvis to further evaluate intraabdominal pathology.  -admit to tele -NPO; gentle hydration with NS at 75cc/hr -cycle troponin -morphine 2mg  q4hprn -zofran prn  -Ct abd/pelvis w/ contrast -BMP in AM -protonix 40mg  IV (on dexlansoprazole 60mg  daily at  home)  # H/o CAD: Patient with cath in 2011 that showed nonobstructive CAD. Patient with RF for ACS including DM, HTN, HLD so will rule out.  -cycle troponin -EKG  # dCHF: Patient with grade 1 diastolic dysfunction with preserved EF as of Echo 06/2013. Euvolemic currently and no concern for exacerbation.  -gentle hydration with caution for volume overload -holding lasix while NPO, likely need to restart tomorrow  # HTN: Blood pressure stable and equal in both arms. Patient is on IMDUR 60mg  daily, cozaar 50mg  dialy, metoprolol 25mg  daily, clonidine patch, lasix 40mg  daily -continue clonidine patch -holding oral medications while NPO -hydralazine 5mg  IV prn for BP >170/110  #CKD stage 3: Baseline Cr around 1.5. Cr on admission 1.17.   # depression/anxiety: Mood stable.  -hold prozac and wellbutrin while NPO  #DM type 2: Last A1c 10.4 12/2013. She is on lantus 67U qHS and novolog 17U TID w/ meals. Also on gabapentin 300mg  TID at home. -SSI, mod -hold lantus while NPO -hold gabapentin while NPO  # VTE: lovenox  # Diet: NPO  Code status: full  Dispo: Disposition is deferred at this time, awaiting improvement of current medical problems. Anticipated discharge in approximately 1 day(s).   The patient does have a current PCP (Neema Davina PokeK Sharda, MD) and does need an [facility] hospital follow-up appointment after discharge.  The patient does not have transportation limitations that hinder transportation to clinic appointments.  Signed: Windell Hummingbirdachel Sandon Yoho, MD 01/30/2014, 7:07 PM

## 2014-01-30 NOTE — Addendum Note (Signed)
Addended by: Bufford SpikesFULCHER, Lorin Gawron N on: 01/30/2014 05:02 PM   Modules accepted: Orders

## 2014-01-30 NOTE — Telephone Encounter (Signed)
Pt called with c/o not feeling well.  She is not eating or drinking.  Cold and hot.  Headache and low back.  Onset yesterday. She has not taken any meds.  Sleeping helps.   Pt offered an appointment this morning but can not come in. She will call and see when she can get a ride here.

## 2014-01-30 NOTE — Progress Notes (Signed)
Subjective:   Patient ID: [name] female   DOB: 01-24-1951 63 y.o.   MRN: [id number]  HPI: [name] is a 63 y.o. African American woman with PMH significant for hypertension, coronary artery disease, poorly controlled diabetes, chronic kidney disease stage II, history of bowel obstruction status post laparotomy with the lysis of adhesions, small bowel resection in march 2014 comes to the office with chief complaints of a severe abdominal pain.  Patient reports that she was feeling well on Monday when she went to bed. She woke up in the middle of the night at 1 AM on Monday with the severe abdominal pain. The pain is diffuse but most severe in the mid abdomen, 8-9/10 in severity, nonradiating, "piercing" type of pain,  intermittent (the frequency and duration changes from position to position), aggravated in supine position and slightly relieved in right lateral and left lateral position. She reports that her abdomen is slightly distended. She reports passing gas. She denies any nausea, vomiting, diarrhea, constipation, dysuria, difficulty breathing.   She also complains of diffuse headaches, body pains, back pains, chest discomfort since yesterday. Headaches are diffuse, constant, started since yesterday morning. Chest discomfort is mid-sternal, burning type, non-radiating, no associated SOB, nausea or vomiting. She denies any fevers, chills.   She reports that her CBG was 145 yesterday afternoon when she ate some salad. But she did not feel like eating or drinking anything since yesterday afternoon.  She reports taking short-acting insulin 17 units prior to eating salad yesterday. Other than Insulin, she denies taking any medications yesterday and today. She reports checking her blood sugars daily at least 3 times a day and the her highest blood sugar in the last last one week was 250 and the lowest is a 59.   She reports that she has been feeling sleepy all day yesterday and today.     Past Medical History  Diagnosis Date  . Hypertension   . Peripheral neuropathy   . Cervicalgia   . Low back pain syndrome   . Hyperlipidemia   . Vitamin D deficiency   . Vitamin B12 deficiency   . Iliotibial band syndrome   . Anemia   . Borderline personality disorder   . Nonorganic psychosis   . GERD (gastroesophageal reflux disease)   . Diabetes mellitus   . Depression   . Diabetic gastroparesis   . Sleep apnea   . CAD (coronary artery disease)     Catherization 11/18/09>nonobstructive CAD , normal LV function, EF 65%  . Health maintenance examination     Colonoscopy 2009> normal; Diabetic eye exam 12/2008. No diabetic retinopathy; Mammogram 11/10> No evidence of malignancy, DXA 03/14/13 : normal  . Headache(784.0)   . Arthritis   . SBO (small bowel obstruction) 01/09/2013  . Diastolic CHF     Grade I, on Echo 06/2013, EF 55-60%   Current Outpatient Prescriptions  Medication Sig Dispense Refill  . ACCU-CHEK FASTCLIX LANCETS MISC 1 each by Does not apply route 3 (three) times daily before meals. Dx code 250.00  102 each  12  . aspirin 81 MG chewable tablet Chew 81 mg by mouth daily.      Marland Kitchen buPROPion (WELLBUTRIN XL) 150 MG 24 hr tablet Take 3 tablets (450 mg total) by mouth daily.  90 tablet  5  . cloNIDine (CATAPRES - DOSED IN MG/24 HR) 0.2 mg/24hr patch Place 1 patch (0.2 mg total) onto the skin every 7 (seven) days.  4 patch  0  .  dexlansoprazole (DEXILANT) 60 MG capsule Take 60 mg by mouth daily.        Mariane Baumgarten Sodium (COLACE PO) Take 2 tablets by mouth daily as needed (constipation).      Marland Kitchen FLUoxetine (PROZAC) 20 MG capsule Take 1 capsule (20 mg total) by mouth every morning.  30 capsule  5  . furosemide (LASIX) 40 MG tablet Take 1 tablet (40 mg total) by mouth daily.  30 tablet  11  . gabapentin (NEURONTIN) 800 MG tablet Take 800-1,600 mg by mouth 2 (two) times daily. 800 mg in the am and 1600 mg in the evening.      Marland Kitchen glucose blood (ACCU-CHEK SMARTVIEW) test strip  Use to check blood sugar before meals dx code 250.00  100 each  12  . glucose monitoring kit (FREESTYLE) monitoring kit 1 each by Does not apply route as needed for other.  1 each  0  . HYDROcodone-acetaminophen (NORCO/VICODIN) 5-325 MG per tablet Take 1 tablet by mouth 3 (three) times daily as needed for moderate pain.  90 tablet  0  . insulin aspart (NOVOLOG FLEXPEN) 100 UNIT/ML FlexPen Inject 17 Units into the skin 3 (three) times daily with meals.  5 pen  12  . Insulin Glargine (LANTUS) 100 UNIT/ML Solostar Pen Inject 67 Units into the skin daily at 10 pm.  15 mL  11  . Insulin Pen Needle 31G X 5 MM MISC Box of 100 - uses up to 5 times per day according to CBG  100 each  11  . isosorbide mononitrate (IMDUR) 60 MG 24 hr tablet Take 1 tablet (60 mg total) by mouth daily.  30 tablet  6  . losartan (COZAAR) 100 MG tablet Take 0.5 tablets (50 mg total) by mouth daily.  30 tablet  11  . metoprolol succinate (TOPROL-XL) 25 MG 24 hr tablet Take 1 tablet (25 mg total) by mouth daily.  30 tablet  11  . promethazine (PHENERGAN) 25 MG tablet Take 1 tablet (25 mg total) by mouth every 6 (six) hours as needed for nausea.  20 tablet  1  . senna (SENOKOT) 8.6 MG TABS Take 4 tablets by mouth daily as needed (constipation).      . temazepam (RESTORIL) 15 MG capsule take 1 capsule by mouth at bedtime if needed for sleep  30 capsule  0   No current facility-administered medications for this visit.   Family History  Problem Relation Age of Onset  . Diabetes Mother   . Hypertension Mother   . Hyperlipidemia Mother   . Arthritis Mother   . Depression Mother   . Heart disease Father   . Diabetes Sister   . Diabetes Brother   . Heart disease Brother   . Heart disease Paternal Grandmother   . Seizures Brother    History   Social History  . Marital Status: Married    Spouse Name: N/A    Number of Children: N/A  . Years of Education: N/A   Social History Main Topics  . Smoking status: Former Smoker     Types: Cigarettes    Quit date: 10/25/2002  . Smokeless tobacco: None  . Alcohol Use: No  . Drug Use: No  . Sexual Activity: Yes   Other Topics Concern  . None   Social History Narrative  . None   Review of Systems: Pertinent items are noted in HPI. Complete review of symptoms was not obtained as the patient was in moderate distress from abdominal  pain and sleepy.  Objective:  Physical Exam: Filed Vitals:   01/30/14 1119  BP: 173/84  Pulse: 87  Temp: 97.2 F (36.2 C)  TempSrc: Oral  Weight: 208 lb (94.348 kg)  SpO2: 99%   Constitutional: Vital signs reviewed.  Patient is a well-developed and well-nourished and in acute distress from abdominal pain. She is very lethargic but co-operative for the examination. Alert and oriented x3.  Head: Normocephalic and atraumatic Mouth: no erythema or exudates, slightly moist mucous membranes.  No dentition noted. Eyes: PERRL, EOMI, conjunctivae normal, No scleral icterus.  Neck: Supple, Trachea midline.  Cardiovascular: RRR, S1, S2 normal, no MRG. Pulmonary/Chest: normal respiratory effort, CTAB, no wheezes, rales, or rhonchi. Abdominal:  Soft, mildly distended. Severe tenderness to palpation was elicited mostly in the umbilical area but mild diffuse tenderness was noted as well. There is no rigidity noted but mild guarding noticed. Could not look for organomegaly as the patient was in distress. Percussion was also not done as she was in severe pain.  GU: no CVA tenderness Neurological: A&O x3. Lethargic but neurological exam is grossly normal.  Skin: Warm, dry and intact.   Assessment & Plan:

## 2014-01-30 NOTE — Assessment & Plan Note (Addendum)
Most likely secondary to bowel obstruction. Other differentials include acute gastritis, GERD, pancreatitis, ACS. Given her history of small bowel obstruction, laparotomy, lysis of adhesions bowel obstruction seems most likely.  Plans 1. Admit the patient to the hospital for observation. 2. Get abdominal x-rays, CBC,CMP, serum lipase, urinalysis.  3. Make patient n.p.o. pending AXR and serum lipase studies.  4. Will get a 12 lead ECG once she is on floors. 5. Remaining orders as per the admitting team.

## 2014-01-30 NOTE — Progress Notes (Signed)
Dyke Maeslice Wigfall [id number] Admission Data: 01/30/2014 5:23 PM Attending Provider: Farley LyJerry Dale Joines, MD  QMV:HQIONGPCP:SHARDA, Anselm LisNEEMA, MD Consults/ Treatment Team:  Teaching Service.  Dyke Maeslice Seymore is a 63 y.o. female patient admitted from ED awake, alert  & orientated  X 3,  Prior, VSS - Blood pressure 158/72, pulse 71, temperature 98.7 F (37.1 C), temperature source Oral, resp. rate 18, height 5\' 5"  (1.651 m), weight 92.987 kg (205 lb), SpO2 96.00%.,  no c/o shortness of breath, no c/o chest pain, no distress noted. Tele # 20 placed, running SR.    IV site WDL: Left anterior forearm, NS at 11225ml/hour, dated 01/30/14.  Allergies:   Allergies  Allergen Reactions  . Indomethacin Diarrhea    Caused severe diarrhea with episodes of incontinance  . Penicillins Hives  . Cimetidine     Breast swelling  . Sulfonamide Derivatives     unknown     Past Medical History  Diagnosis Date  . Hypertension   . Peripheral neuropathy   . Cervicalgia   . Low back pain syndrome   . Hyperlipidemia   . Vitamin D deficiency   . Vitamin B12 deficiency   . Iliotibial band syndrome   . Anemia   . Borderline personality disorder   . Nonorganic psychosis   . GERD (gastroesophageal reflux disease)   . Diabetes mellitus   . Depression   . Diabetic gastroparesis   . Sleep apnea   . CAD (coronary artery disease)     Catherization 11/18/09>nonobstructive CAD , normal LV function, EF 65%  . Health maintenance examination     Colonoscopy 2009> normal; Diabetic eye exam 12/2008. No diabetic retinopathy; Mammogram 11/10> No evidence of malignancy, DXA 03/14/13 : normal  . Headache(784.0)   . Arthritis   . SBO (small bowel obstruction) 01/09/2013  . Diastolic CHF     Grade I, on Echo 06/2013, EF 55-60%      Pt orientation to unit, room and routine. Information packet given to patient/family and safety video watched.  Admission INP armband ID verified with patient/family, and in place. SR up x 2, fall risk assessment complete  with Patient and family verbalizing understanding of risks associated with falls. Pt verbalizes an understanding of how to use the call bell and to call for help before getting out of bed.  Skin, clean-dry- intact without evidence of bruising, or skin tears.   No evidence of skin break down noted on exam. Scar to abdomin noted from previous surgery in patient's history.      Will cont to monitor and assist as needed.  Kern ReapKatie L Analeigha Nauman, RN 01/30/2014 5:23 PM

## 2014-01-30 NOTE — Addendum Note (Signed)
Addended by: Arturo MortonBOGGALA, Izek Corvino on: 01/30/2014 02:55 PM   Modules accepted: Orders

## 2014-01-31 ENCOUNTER — Encounter (HOSPITAL_COMMUNITY): Payer: Self-pay | Admitting: General Surgery

## 2014-01-31 DIAGNOSIS — K429 Umbilical hernia without obstruction or gangrene: Secondary | ICD-10-CM | POA: Diagnosis not present

## 2014-01-31 DIAGNOSIS — K432 Incisional hernia without obstruction or gangrene: Secondary | ICD-10-CM

## 2014-01-31 LAB — BASIC METABOLIC PANEL
BUN: 18 mg/dL (ref 6–23)
CALCIUM: 9.3 mg/dL (ref 8.4–10.5)
CO2: 21 mEq/L (ref 19–32)
Chloride: 102 mEq/L (ref 96–112)
Creatinine, Ser: 1.17 mg/dL — ABNORMAL HIGH (ref 0.50–1.10)
GFR calc Af Amer: 56 mL/min — ABNORMAL LOW (ref >90)
GFR calc non Af Amer: 49 mL/min — ABNORMAL LOW (ref >90)
Glucose, Bld: 165 mg/dL — ABNORMAL HIGH (ref 70–99)
Potassium: 4.5 mEq/L (ref 3.7–5.3)
Sodium: 138 mEq/L (ref 137–147)

## 2014-01-31 LAB — FERRITIN: Ferritin: 15 ng/mL (ref 10–291)

## 2014-01-31 LAB — LIPASE, BLOOD: Lipase: 78 U/L — ABNORMAL HIGH (ref 11–59)

## 2014-01-31 LAB — TROPONIN I: Troponin I: <0.3 ng/mL (ref <0.30)

## 2014-01-31 LAB — GLUCOSE, CAPILLARY
GLUCOSE-CAPILLARY: 123 mg/dL — AB (ref 70–99)
GLUCOSE-CAPILLARY: 125 mg/dL — AB (ref 70–99)
GLUCOSE-CAPILLARY: 125 mg/dL — AB (ref 70–99)
Glucose-Capillary: 108 mg/dL — ABNORMAL HIGH (ref 70–99)
Glucose-Capillary: 120 mg/dL — ABNORMAL HIGH (ref 70–99)
Glucose-Capillary: 159 mg/dL — ABNORMAL HIGH (ref 70–99)

## 2014-01-31 MED ORDER — HYDROXYZINE HCL 10 MG PO TABS
10.0000 mg | ORAL_TABLET | Freq: Three times a day (TID) | ORAL | Status: DC | PRN
  Administered 2014-01-31 (×2): 10 mg via ORAL
  Filled 2014-01-31 (×2): qty 1

## 2014-01-31 MED ORDER — DIPHENHYDRAMINE HCL 12.5 MG/5ML PO ELIX
12.5000 mg | ORAL_SOLUTION | Freq: Once | ORAL | Status: AC
  Administered 2014-01-31: 12.5 mg via ORAL
  Filled 2014-01-31: qty 5

## 2014-01-31 MED ORDER — HYDROMORPHONE HCL PF 1 MG/ML IJ SOLN
1.0000 mg | Freq: Once | INTRAMUSCULAR | Status: AC
  Administered 2014-01-31: 1 mg via INTRAVENOUS
  Filled 2014-01-31: qty 1

## 2014-01-31 MED ORDER — SODIUM CHLORIDE 0.9 % IV SOLN
INTRAVENOUS | Status: DC
  Administered 2014-01-31 – 2014-02-01 (×3): 1000 mL via INTRAVENOUS

## 2014-01-31 MED ORDER — DIPHENHYDRAMINE HCL 50 MG/ML IJ SOLN
25.0000 mg | Freq: Three times a day (TID) | INTRAMUSCULAR | Status: DC | PRN
  Administered 2014-02-01 (×3): 25 mg via INTRAVENOUS
  Filled 2014-01-31 (×3): qty 1

## 2014-01-31 MED ORDER — HYDROXYZINE HCL 50 MG/ML IM SOLN
25.0000 mg | Freq: Once | INTRAMUSCULAR | Status: DC
  Filled 2014-01-31: qty 0.5

## 2014-01-31 MED ORDER — OXYCODONE-ACETAMINOPHEN 5-325 MG PO TABS: 1.0000 | ORAL_TABLET | ORAL | Status: DC | PRN

## 2014-01-31 MED ORDER — LORATADINE 10 MG PO TABS
10.0000 mg | ORAL_TABLET | Freq: Once | ORAL | Status: AC
  Administered 2014-01-31: 10 mg via ORAL
  Filled 2014-01-31: qty 1

## 2014-01-31 MED ORDER — LORATADINE 10 MG PO TABS
10.0000 mg | ORAL_TABLET | Freq: Every day | ORAL | Status: DC | PRN
  Administered 2014-01-31: 10 mg via ORAL
  Filled 2014-01-31: qty 1

## 2014-01-31 MED ORDER — HYDROMORPHONE HCL PF 1 MG/ML IJ SOLN
1.0000 mg | INTRAMUSCULAR | Status: DC | PRN
  Administered 2014-01-31 – 2014-02-01 (×3): 1 mg via INTRAVENOUS
  Filled 2014-01-31 (×3): qty 1

## 2014-01-31 MED ORDER — MORPHINE SULFATE 2 MG/ML IJ SOLN: 2.0000 mg | INTRAMUSCULAR | Status: DC | PRN

## 2014-01-31 NOTE — Consult Note (Signed)
Reason for Consult:incisional hernia Referring Physician: Dr. Bertha Stakes   HPI: [name] is a 63 year old female with a history of CAD, HTN, DM2, CKD, dCHF, SBO s/p exploratory laparotomy with SBR and LOA by Dr. Lilyan Punt in March of 2014.  We have been asked to evaluate the patient for abdominal pain and incisional hernia found on CT.  The patient reports developing sudden periumbilical and epigastric pain on Tuesday morning with radiation to LUQ and back.  She was able to fall back asleep, woke up at 7AM, ate and her pain worsened.  It was constant, persistent, sharp.  No aggravating or alleviating factors.  She gets little relief with percocet.  She denies nausea or vomiting.  Last BM was yesterday, passing flatus.  She complains of ongoing constipation for which she takes stool softeners for.  CBC dated 4/8 showed a normal white count.  A CT of abdomen showed a periumbilical hernia containing bowel without an obstruction.    Past Medical History  Diagnosis Date  . Hypertension   . Peripheral neuropathy   . Cervicalgia   . Low back pain syndrome   . Hyperlipidemia   . Vitamin D deficiency   . Vitamin B12 deficiency   . Iliotibial band syndrome   . Anemia   . Borderline personality disorder   . Nonorganic psychosis   . GERD (gastroesophageal reflux disease)   . Diabetes mellitus   . Depression   . Diabetic gastroparesis   . Sleep apnea   . CAD (coronary artery disease)     Catherization 11/18/09>nonobstructive CAD , normal LV function, EF 65%  . Health maintenance examination     Colonoscopy 2009> normal; Diabetic eye exam 12/2008. No diabetic retinopathy; Mammogram 11/10> No evidence of malignancy, DXA 03/14/13 : normal  . Headache(784.0)   . Arthritis   . SBO (small bowel obstruction) 01/09/2013  . Diastolic CHF     Grade I, on Echo 06/2013, EF 55-60%    Past Surgical History  Procedure Laterality Date  . Abdominal hysterectomy    . Hand surgery    . Bowel resection N/A  01/10/2013    Procedure: SMALL BOWEL RESECTION;  Surgeon: Madilyn Hook, DO;  Location: [facility];  Service: General;  Laterality: N/A;  . Lysis of adhesion N/A 01/10/2013    Procedure: LYSIS OF ADHESION;  Surgeon: Madilyn Hook, DO;  Location: [facility];  Service: General;  Laterality: N/A;  . Laparotomy N/A 01/10/2013    Procedure: EXPLORATORY LAPAROTOMY;  Surgeon: Madilyn Hook, DO;  Location: [facility];  Service: General;  Laterality: N/A;    Family History  Problem Relation Age of Onset  . Diabetes Mother   . Hypertension Mother   . Hyperlipidemia Mother   . Arthritis Mother   . Depression Mother   . Heart disease Father   . Diabetes Sister   . Diabetes Brother   . Heart disease Brother   . Heart disease Paternal Grandmother   . Seizures Brother     Social History:  reports that she quit smoking about 11 years ago. Her smoking use included Cigarettes. She smoked 0.00 packs per day. She has never used smokeless tobacco. She reports that she does not drink alcohol or use illicit drugs.  Allergies:  Allergies  Allergen Reactions  . Cimetidine Other (See Comments)    Breast swelling  . Indomethacin Diarrhea    Caused severe diarrhea with episodes of incontinance  . Penicillins Hives and Itching    Other reaction(s): Delusions (  intolerance)  . Indomethacin Other (See Comments)    Severe diarrhea  . Sulfonamide Derivatives     unknown    Medications:  Scheduled Meds: . cloNIDine  0.2 mg Transdermal Q7 days  . enoxaparin (LOVENOX) injection  40 mg Subcutaneous Q24H  .  HYDROmorphone (DILAUDID) injection  1 mg Intravenous Once  . insulin aspart  0-15 Units Subcutaneous 6 times per day  . pantoprazole (PROTONIX) IV  40 mg Intravenous QHS  . sodium chloride  3 mL Intravenous Q12H   Continuous Infusions:  PRN Meds:.acetaminophen, acetaminophen, hydrALAZINE, HYDROmorphone (DILAUDID) injection, loratadine, ondansetron (ZOFRAN) IV, ondansetron, oxyCODONE-acetaminophen  Results for orders  placed during the hospital encounter of 01/30/14 (from the past 48 hour(s))  GLUCOSE, CAPILLARY     Status: Abnormal   Collection Time    01/30/14  5:56 PM      Result Value Ref Range   Glucose-Capillary 146 (*) 70 - 99 mg/dL  URINALYSIS, ROUTINE W REFLEX MICROSCOPIC     Status: Abnormal   Collection Time    01/30/14  6:07 PM      Result Value Ref Range   Color, Urine YELLOW  YELLOW   APPearance HAZY (*) CLEAR   Specific Gravity, Urine 1.022  1.005 - 1.030   pH 5.5  5.0 - 8.0   Glucose, UA NEGATIVE  NEGATIVE mg/dL   Hgb urine dipstick NEGATIVE  NEGATIVE   Bilirubin Urine NEGATIVE  NEGATIVE   Ketones, ur NEGATIVE  NEGATIVE mg/dL   Protein, ur 30 (*) NEGATIVE mg/dL   Urobilinogen, UA 0.2  0.0 - 1.0 mg/dL   Nitrite NEGATIVE  NEGATIVE   Leukocytes, UA NEGATIVE  NEGATIVE  URINE MICROSCOPIC-ADD ON     Status: Abnormal   Collection Time    01/30/14  6:07 PM      Result Value Ref Range   Squamous Epithelial / LPF FEW (*) RARE   WBC, UA 0-2  <3 WBC/hpf   Bacteria, UA RARE  RARE   Casts HYALINE CASTS (*) NEGATIVE   Urine-Other MUCOUS PRESENT    GLUCOSE, CAPILLARY     Status: Abnormal   Collection Time    01/30/14  8:11 PM      Result Value Ref Range   Glucose-Capillary 115 (*) 70 - 99 mg/dL   Comment 1 Documented in Chart     Comment 2 Notify RN    TROPONIN I     Status: None   Collection Time    01/30/14  9:58 PM      Result Value Ref Range   Troponin I <0.30  <0.30 ng/mL   Comment:            Due to the release kinetics of cTnI,     a negative result within the first hours     of the onset of symptoms does not rule out     myocardial infarction with certainty.     If myocardial infarction is still suspected,     repeat the test at appropriate intervals.  GLUCOSE, CAPILLARY     Status: Abnormal   Collection Time    01/31/14 12:21 AM      Result Value Ref Range   Glucose-Capillary 108 (*) 70 - 99 mg/dL  TROPONIN I     Status: None   Collection Time    01/31/14  3:15 AM       Result Value Ref Range   Troponin I <0.30  <0.30 ng/mL   Comment:  Due to the release kinetics of cTnI,     a negative result within the first hours     of the onset of symptoms does not rule out     myocardial infarction with certainty.     If myocardial infarction is still suspected,     repeat the test at appropriate intervals.  GLUCOSE, CAPILLARY     Status: Abnormal   Collection Time    01/31/14  4:07 AM      Result Value Ref Range   Glucose-Capillary 123 (*) 70 - 99 mg/dL   Comment 1 Documented in Chart     Comment 2 Notify RN    GLUCOSE, CAPILLARY     Status: Abnormal   Collection Time    01/31/14  7:37 AM      Result Value Ref Range   Glucose-Capillary 120 (*) 70 - 99 mg/dL  BASIC METABOLIC PANEL     Status: Abnormal   Collection Time    01/31/14  9:50 AM      Result Value Ref Range   Sodium 138  137 - 147 mEq/L   Potassium 4.5  3.7 - 5.3 mEq/L   Chloride 102  96 - 112 mEq/L   CO2 21  19 - 32 mEq/L   Glucose, Bld 165 (*) 70 - 99 mg/dL   BUN 18  6 - 23 mg/dL   Creatinine, Ser 1.17 (*) 0.50 - 1.10 mg/dL   Calcium 9.3  8.4 - 10.5 mg/dL   GFR calc non Af Amer 49 (*) >90 mL/min   GFR calc Af Amer 56 (*) >90 mL/min   Comment: (NOTE)     The eGFR has been calculated using the CKD EPI equation.     This calculation has not been validated in all clinical situations.     eGFR's persistently <90 mL/min signify possible Chronic Kidney     Disease.    Dg Chest 2 View  01/30/2014   CLINICAL DATA:  abdominal pain, R/O PNA  EXAM: CHEST  2 VIEW  COMPARISON:  DG CHEST 2 VIEW dated 08/08/2013  FINDINGS: The heart size and mediastinal contours are within normal limits. Both lungs are clear. The visualized skeletal structures are unremarkable.  IMPRESSION: No active cardiopulmonary disease.   Electronically Signed   By: Margaree Mackintosh M.D.   On: 01/30/2014 16:29   Ct Abdomen Pelvis W Contrast  01/31/2014   CLINICAL DATA:  Acute abdominal pain.  EXAM: CT ABDOMEN AND  PELVIS WITH CONTRAST  TECHNIQUE: Multidetector CT imaging of the abdomen and pelvis was performed using the standard protocol following bolus administration of intravenous contrast.  CONTRAST:  38mL OMNIPAQUE IOHEXOL 300 MG/ML  SOLN  COMPARISON:  08/08/2013  FINDINGS: BODY WALL: There is a periumbilical hernia, with 2 closely neighboring sacs, that contains nonobstructed small bowel. There are additional smaller, fatty hernias in the upper abdominal midline.  LOWER CHEST: Coronary atherosclerosis.  Mild dependent atelectasis.  ABDOMEN/PELVIS:  Liver: No focal abnormality.  Biliary: No evidence of biliary obstruction or stone.  Pancreas: Unremarkable.  Spleen: Unremarkable.  Adrenals: Unremarkable.  Kidneys and ureters: No hydronephrosis or stone.  Bladder: Unremarkable.  Reproductive: Hysterectomy.  Bowel: Enteroenterostomy noted in the central abdomen. No obstruction. Negative appendix.  Retroperitoneum: No mass or adenopathy.  Peritoneum: No free fluid or gas.  Vascular: Diffuse aortic and branch vessel atherosclerosis.  OSSEOUS: No acute abnormalities.  IMPRESSION: 1. No acute intra-abdominal findings. 2. Periumbilical hernia containing nonobstructed small bowel.   Electronically Signed  By: Jorje Guild M.D.   On: 01/31/2014 03:10   Dg Abd 2 Views  01/30/2014   CLINICAL DATA:  Abdominal pain.  EXAM: ABDOMEN - 2 VIEW  COMPARISON:  CT abdomen and pelvis 08/08/2013. Plain films the abdomen 01/13/2013.  FINDINGS: No free intraperitoneal air is identified. The bowel gas pattern is normal. Injection granulomata in the buttocks are identified as on CT scan. No focal bony abnormality is identified.  IMPRESSION: Negative exam.   Electronically Signed   By: Inge Rise M.D.   On: 01/30/2014 16:26    Review of Systems  All other systems reviewed and are negative.  Blood pressure 145/78, pulse 66, temperature 98.2 F (36.8 C), temperature source Oral, resp. rate 16, height $RemoveBe'5\' 5"'[id number]$  (1.651 m), weight 205 lb  (92.987 kg), SpO2 98.00%. Physical Exam  Constitutional: She appears well-developed and well-nourished. No distress.  HENT:  Head: Normocephalic and atraumatic.  Cardiovascular: Normal rate, regular rhythm and normal heart sounds.  Exam reveals no gallop and no friction rub.   No murmur heard. Respiratory: Effort normal and breath sounds normal. No respiratory distress. She has no wheezes. She has no rales.  GI: Soft. Bowel sounds are normal. She exhibits no distension.  Easily reducible periumbilical hernia, very tender to umbilicus, diastasis rectus.  She also exhibited tenderness to the epigastric region.    Musculoskeletal: Normal range of motion. She exhibits no edema.  Lymphadenopathy:    She has no cervical adenopathy.  Skin: Skin is warm and dry. No rash noted. She is not diaphoretic. No erythema. No pallor.  Psychiatric: She has a normal mood and affect. Thought content normal.    Assessment/Plan: Hx laparotomy SBR, LOA 12/2012 Incisional hernia, easily reducible, tender The hernia will need to be fixed at some point.  Given the amount of pain she is having, she may need surgical intervention this admission. Keep her NPO for now, pain control, IV hydration. I will discuss with Dr. Hulen Skains.  Thank you for the consult.    Newel Oien  ANP-BC Pager [phone] 01/31/2014, 10:58 AM

## 2014-01-31 NOTE — Progress Notes (Signed)
Subjective: Patient with mild improvement of her abdominal pain overnight, no 7/10. Pain remains sharp, centrally located and constant. No N/V/D, SOB. Mild chest pain that appears to be chronic.  Objective: Vital signs in last 24 hours: Filed Vitals:   01/30/14 1830 01/30/14 1832 01/30/14 2009 01/31/14 0402  BP: 161/84 158/86 137/78 145/78  Pulse:   70 66  Temp:   98.5 F (36.9 C) 98.2 F (36.8 C)  TempSrc:   Oral Oral  Resp:   16 16  Height:      Weight:      SpO2:   96% 98%   Weight change:   Intake/Output Summary (Last 24 hours) at 01/31/14 0955 Last data filed at 01/31/14 0605  Gross per 24 hour  Intake 951.25 ml  Output    250 ml  Net 701.25 ml   Physical Exam General: alert, cooperative, appears to be somewhat uncomfortable HEENT: pupils equal round and reactive to light, vision grossly intact, oropharynx clear and non-erythematous, MMM  Neck: supple Lungs: CTAB Heart: regular rate and rhythm, no murmurs, gallops, or rubs; there is some TTP over central sternum Abdomen: soft, TTP to epigastrium and periumbilical region and LUQ, non-distended, decreased bowel sounds; no rebound/guarding; no palpable masses Extremities: warm, no pedal edema Neurologic: alert & oriented X3, cranial nerves II-XII grossly intact, strength grossly intact, sensation intact to light touch  Lab Results: Basic Metabolic Panel:  Recent Labs Lab 01/30/14 1248  NA 140  K 5.2  CL 104  CO2 22  GLUCOSE 206*  BUN 22  CREATININE 1.17*  CALCIUM 9.9   Liver Function Tests:  Recent Labs Lab 01/30/14 1248  AST 16  ALT 15  ALKPHOS 92  BILITOT <0.2*  PROT 8.1  ALBUMIN 3.6    Recent Labs Lab 01/30/14 1248  LIPASE 66*   CBC:  Recent Labs Lab 01/30/14 1248  WBC 6.9  NEUTROABS 3.6  HGB 11.2*  HCT 34.2*  MCV 74.7*  PLT 270   Cardiac Enzymes:  Recent Labs Lab 01/30/14 1516 01/30/14 2158 01/31/14 0315  TROPONINI <0.30 <0.30 <0.30   CBG:  Recent Labs Lab  01/30/14 1120 01/30/14 1756 01/30/14 2011 01/31/14 0021 01/31/14 0407 01/31/14 0737  GLUCAP 191* 146* 115* 108* 123* 120*   Urinalysis:  Recent Labs Lab 01/30/14 1807  COLORURINE YELLOW  LABSPEC 1.022  PHURINE 5.5  GLUCOSEU NEGATIVE  HGBUR NEGATIVE  BILIRUBINUR NEGATIVE  KETONESUR NEGATIVE  PROTEINUR 30*  UROBILINOGEN 0.2  NITRITE NEGATIVE  LEUKOCYTESUR NEGATIVE   Studies/Results: Dg Chest 2 View  01/30/2014   CLINICAL DATA:  abdominal pain, R/O PNA  EXAM: CHEST  2 VIEW  COMPARISON:  DG CHEST 2 VIEW dated 08/08/2013  FINDINGS: The heart size and mediastinal contours are within normal limits. Both lungs are clear. The visualized skeletal structures are unremarkable.  IMPRESSION: No active cardiopulmonary disease.   Electronically Signed   By: Salome Holmes M.D.   On: 01/30/2014 16:29   Ct Abdomen Pelvis W Contrast  01/31/2014   CLINICAL DATA:  Acute abdominal pain.  EXAM: CT ABDOMEN AND PELVIS WITH CONTRAST  TECHNIQUE: Multidetector CT imaging of the abdomen and pelvis was performed using the standard protocol following bolus administration of intravenous contrast.  CONTRAST:  80mL OMNIPAQUE IOHEXOL 300 MG/ML  SOLN  COMPARISON:  08/08/2013  FINDINGS: BODY WALL: There is a periumbilical hernia, with 2 closely neighboring sacs, that contains nonobstructed small bowel. There are additional smaller, fatty hernias in the upper abdominal midline.  LOWER CHEST:  Coronary atherosclerosis.  Mild dependent atelectasis.  ABDOMEN/PELVIS:  Liver: No focal abnormality.  Biliary: No evidence of biliary obstruction or stone.  Pancreas: Unremarkable.  Spleen: Unremarkable.  Adrenals: Unremarkable.  Kidneys and ureters: No hydronephrosis or stone.  Bladder: Unremarkable.  Reproductive: Hysterectomy.  Bowel: Enteroenterostomy noted in the central abdomen. No obstruction. Negative appendix.  Retroperitoneum: No mass or adenopathy.  Peritoneum: No free fluid or gas.  Vascular: Diffuse aortic and branch vessel  atherosclerosis.  OSSEOUS: No acute abnormalities.  IMPRESSION: 1. No acute intra-abdominal findings. 2. Periumbilical hernia containing nonobstructed small bowel.   Electronically Signed   By: Tiburcio PeaJonathan  Watts M.D.   On: 01/31/2014 03:10   Dg Abd 2 Views  01/30/2014   CLINICAL DATA:  Abdominal pain.  EXAM: ABDOMEN - 2 VIEW  COMPARISON:  CT abdomen and pelvis 08/08/2013. Plain films the abdomen 01/13/2013.  FINDINGS: No free intraperitoneal air is identified. The bowel gas pattern is normal. Injection granulomata in the buttocks are identified as on CT scan. No focal bony abnormality is identified.  IMPRESSION: Negative exam.   Electronically Signed   By: Drusilla Kannerhomas  Dalessio M.D.   On: 01/30/2014 16:26   Medications: I have reviewed the patient's current medications. Scheduled Meds: . cloNIDine  0.2 mg Transdermal Q7 days  . enoxaparin (LOVENOX) injection  40 mg Subcutaneous Q24H  . insulin aspart  0-15 Units Subcutaneous 6 times per day  . pantoprazole (PROTONIX) IV  40 mg Intravenous QHS  . sodium chloride  3 mL Intravenous Q12H   Continuous Infusions:  PRN Meds:.acetaminophen, acetaminophen, hydrALAZINE, loratadine, morphine injection, ondansetron (ZOFRAN) IV, ondansetron Assessment/Plan:  # Epigastric abd pain: Patient with improved, but continued abd pain overnight. No N/V/D. VSS. Patient with mildly elevated lipase, which is somewhat nonspecific. S/S not necessarily c/w pancreatitis. Will repeat lipase today. ACS ruled out with normal EKGs and troponin negative x 3. CT Abd pelvis showed periumbilical hernia containing nonobstructed small bowel. This is more likely the cause of patient's symptoms. Abdomen remains soft, no guarding or distension. No concern for surgical abdomen. The hernia is not palpable on my exam.  -advance diet as tolerated- CLD today -d/c IVF -morphine 2mg  q4hprn -->transition to percocet today  -zofran prn  -repeat lipase -protonix 40mg  IV (on dexlansoprazole 60mg  daily  at home)   # H/o CAD: Patient with cath in 2011 that showed nonobstructive CAD. Patient with RF for ACS including DM, HTN, HLD so ACS was ruled out with three negative troponins. EKG this morning unremarkable.   # dCHF: Patient with grade 1 diastolic dysfunction with preserved EF as of Echo 06/2013. Euvolemic currently and no concern for exacerbation.  -restart home lasix if tolerates diet   # HTN:  Patient is on IMDUR 60mg  daily, cozaar 50mg  daily, metoprolol 25mg  daily, clonidine patch, lasix 40mg  daily  -continue clonidine patch  -restart home antihypertensives if tolerating diet later today -hydralazine 5mg  IV prn for BP >170/110   #CKD stage 3: Baseline Cr around 1.5. Cr on admission 1.17.   # depression/anxiety: Mood stable.  -restart home prozac and wellbutrin if tolerates PO today  #DM type 2: Last A1c 10.4 12/2013. She is on lantus 67U qHS and novolog 17U TID w/ meals. Also on gabapentin 300mg  TID at home. CBGs 108-123 overnight while NPO. May need to add back some lantus since advancing diet. -SSI, mod  -hold lantus for now with plan to add back likely later today or tomorrow -hold gabapentin until sure she is tolerating PO  #  VTE: lovenox   # Diet: CLD  Code status: full  Dispo: Disposition is deferred at this time, awaiting improvement of current medical problems.  Anticipated discharge in approximately 1 day(s).   The patient does have a current PCP (Neema Davina Poke, MD) and does need an [facility] hospital follow-up appointment after discharge.  The patient does not have transportation limitations that hinder transportation to clinic appointments.  .Services Needed at time of discharge: Y = Yes, Blank = No PT:   OT:   RN:   Equipment:   Other:     LOS: 1 day   Windell Hummingbird, MD 01/31/2014, 9:55 AM

## 2014-01-31 NOTE — Progress Notes (Signed)
I have seen the patient and reviewed the daily progress note by Jimmy Chen MS3 and discussed the care of the patient with them.  See my note for documentation of my findings, assessment, and plans. 

## 2014-01-31 NOTE — H&P (Signed)
I have seen the patient and reviewed the daily progress note by Jimmy Chen MS3 and discussed the care of the patient with them.  See my note for documentation of my findings, assessment, and plans. 

## 2014-01-31 NOTE — Discharge Summary (Signed)
Name: [name] MRN: [id number] DOB: 09/28/1951 63 y.o. PCP: Belia HemanNeema K Sharda, MD  Date of Admission: 01/30/2014  4:49 PM Date of Discharge: 02/02/2014 Attending Physician: Farley LyJerry Dale Joines, MD  Discharge Diagnosis: Principal Problem:   Periumbilical hernia Active Problems:   Uncontrolled type 2 diabetes mellitus with insulin therapy   HYPERTENSION   CKD (chronic kidney disease) stage 3, GFR 30-59 ml/min   Chronic chest pain   Abdominal pain, acute    Discharge Medications:   Medication List    STOP taking these medications       insulin aspart 100 UNIT/ML FlexPen  Commonly known as:  NOVOLOG FLEXPEN     Insulin Glargine 100 UNIT/ML Solostar Pen  Commonly known as:  LANTUS      TAKE these medications       aspirin 81 MG chewable tablet  Chew 81 mg by mouth daily.     buPROPion 150 MG 24 hr tablet  Commonly known as:  WELLBUTRIN XL  Take 3 tablets (450 mg total) by mouth daily.     cloNIDine 0.2 mg/24hr patch  Commonly known as:  CATAPRES - Dosed in mg/24 hr  Place 1 patch (0.2 mg total) onto the skin every 7 (seven) days.     COLACE PO  Take 2 tablets by mouth daily as needed (constipation).     DEXILANT 60 MG capsule  Generic drug:  dexlansoprazole  Take 60 mg by mouth daily.     FLUoxetine 20 MG capsule  Commonly known as:  PROZAC  Take 1 capsule (20 mg total) by mouth every morning.     furosemide 40 MG tablet  Commonly known as:  LASIX  Take 1 tablet (40 mg total) by mouth daily.     gabapentin 800 MG tablet  Commonly known as:  NEURONTIN  Take 800-1,600 mg by mouth 2 (two) times daily. 800 mg in the am and 1600 mg in the evening.     HYDROcodone-acetaminophen 5-325 MG per tablet  Commonly known as:  NORCO/VICODIN  Take 1 tablet by mouth 3 (three) times daily as needed for moderate pain.     isosorbide mononitrate 60 MG 24 hr tablet  Commonly known as:  IMDUR  Take 1 tablet (60 mg total) by mouth daily.     losartan 100 MG tablet    Commonly known as:  COZAAR  Take 0.5 tablets (50 mg total) by mouth daily.     metoprolol succinate 25 MG 24 hr tablet  Commonly known as:  TOPROL-XL  Take 1 tablet (25 mg total) by mouth daily.     ondansetron 4 MG tablet  Commonly known as:  ZOFRAN  Take 1 tablet (4 mg total) by mouth every 6 (six) hours as needed for nausea.     promethazine 25 MG tablet  Commonly known as:  PHENERGAN  Take 1 tablet (25 mg total) by mouth every 6 (six) hours as needed for nausea.     senna 8.6 MG Tabs tablet  Commonly known as:  SENOKOT  Take 4 tablets by mouth daily as needed (constipation).     temazepam 15 MG capsule  Commonly known as:  RESTORIL  take 1 capsule by mouth at bedtime if needed for sleep        Disposition and follow-up:   [name] was [facility] in St[facility].  At the hospital follow up visit please address:  1.  Her blood sugars: Her Lantus was restarted  at 5 units as she has not been requiring any here in the hospital, likely 2/2 to her decreased po intake. I suspect that once she returns home and her eating habits return to normal her insulin requirement will increase. She was advised to check her CBGs TID before meals and give herself 2u of Novolog for CBGs >150 with her poor po intake. She is to f/u early next week for insulin adjustment.  2.  Labs / imaging needed at time of follow-up: Check CBGs  3.  Pending labs/ test needing follow-up: None  Follow-up Appointments: Follow-up Information   Follow up with Ky Barban, MD On 02/07/2014. (@8 :45 Hospital follow up)    Specialty:  Internal Medicine   Contact information:   [address] [place] [address] [phone]       Follow up with Lajean Saver, MD On 02/05/2014. (@ 2:15pm Hospital follow with surgery up for hernia)    Specialty:  General Surgery   Contact information:   [address] [place] [address] [phone]        Discharge Instructions: Discharge Orders   Future Appointments Provider Department Dept Phone   02/05/2014 2:15 PM Axel Filler, MD [facility], [place] [phone]   02/07/2014 8:45 AM Ky Barban, MD [facility] [phone]   02/22/2014 2:00 PM Belia Heman, MD [facility] [phone]   02/22/2014 2:30 PM Cecil Cranker Plyler, RD [facility] [phone]   Future Orders Complete By Expires   Call MD for:  persistant nausea and vomiting  As directed    Call MD for:  severe uncontrolled pain  As directed    Call MD for:  temperature >100.4  As directed    Diet - low sodium heart healthy  As directed    Discharge instructions  As directed    Increase activity slowly  As directed       Consultations:  General Surgery   Procedures Performed:  Dg Chest 2 View  01/30/2014   CLINICAL DATA:  abdominal pain, R/O PNA  EXAM: CHEST  2 VIEW  COMPARISON:  DG CHEST 2 VIEW dated 08/08/2013  FINDINGS: The heart size and mediastinal contours are within normal limits. Both lungs are clear. The visualized skeletal structures are unremarkable.  IMPRESSION: No active cardiopulmonary disease.   Electronically Signed   By: Salome Holmes M.D.   On: 01/30/2014 16:29   Ct Abdomen Pelvis W Contrast  01/31/2014   CLINICAL DATA:  Acute abdominal pain.  EXAM: CT ABDOMEN AND PELVIS WITH CONTRAST  TECHNIQUE: Multidetector CT imaging of the abdomen and pelvis was performed using the standard protocol following bolus administration of intravenous contrast.  CONTRAST:  80mL OMNIPAQUE IOHEXOL 300 MG/ML  SOLN  COMPARISON:  08/08/2013  FINDINGS: BODY WALL: There is a periumbilical hernia, with 2 closely neighboring sacs, that contains nonobstructed small bowel. There are additional smaller, fatty hernias in the upper abdominal midline.  LOWER CHEST: Coronary atherosclerosis.  Mild dependent atelectasis.  ABDOMEN/PELVIS:  Liver: No focal  abnormality.  Biliary: No evidence of biliary obstruction or stone.  Pancreas: Unremarkable.  Spleen: Unremarkable.  Adrenals: Unremarkable.  Kidneys and ureters: No hydronephrosis or stone.  Bladder: Unremarkable.  Reproductive: Hysterectomy.  Bowel: Enteroenterostomy noted in the central abdomen. No obstruction. Negative appendix.  Retroperitoneum: No mass or adenopathy.  Peritoneum: No free fluid or gas.  Vascular: Diffuse aortic and branch vessel atherosclerosis.  OSSEOUS: No acute abnormalities.  IMPRESSION: 1. No acute intra-abdominal findings. 2. Periumbilical hernia containing nonobstructed small bowel.   Electronically Signed   By: Tiburcio Pea M.D.   On: 01/31/2014 03:10   Dg Abd 2 Views  01/30/2014   CLINICAL DATA:  Abdominal pain.  EXAM: ABDOMEN - 2 VIEW  COMPARISON:  CT abdomen and pelvis 08/08/2013. Plain films the abdomen 01/13/2013.  FINDINGS: No free intraperitoneal air is identified. The bowel gas pattern is normal. Injection granulomata in the buttocks are identified as on CT scan. No focal bony abnormality is identified.  IMPRESSION: Negative exam.   Electronically Signed   By: Drusilla Kanner M.D.   On: 01/30/2014 16:26   Admission HPI:  [name] is 63yo F w/ PMH HTN, CAD, DM2, CKD stage 2, dCHF, GERD, SBO s/p exlap w/ bowel resection 12/2012 who presents with epigastric abd pain. Patient at a hamburger 2 nights ago and was awoken by sharp epigastric pain at around 1am that night. Pain radiates around R side into her back. No N/V/D, F/C, dysuria, lightheadedness, SOB, hematochezia. Last normal bowel movement was yesterday.  Patient had normal colonoscopy 2009.   Hospital Course by problem list: # Periumbilical hernia with pain: Pt presented with severe abdominal pain and was admitted from the clinic. Despite the pain, she denies N/V and was having BM and flatus, and lipase only 66. However with her h/o SBO, we were concerened that she may have a reoccurrence. CT abdomen and  pelvis revealed a periumbilical hernia that was not incarcerated. She was not having any N/V/D on admission. However, she was made NPO and General Surgery was consulted for evaluation. They felt that the pt could f/u as an outpatient for hernia repair. Her pain has been fairly well controlled with oral pain medications. Her diet was advanced, and while she has not been taking in large amounts of food, she is able to tolerate a regular diet with minimal pain and no N/V. She was ready for d/c home today. Will resume her home Vicodin; she was given a prescription for some additional Vicodin and for Zofran.  - Diet as tolerated  - Vicodin (home med) - Zofran or phenergan prn  - Restarting home Dexalant  #DM type 2: Last A1c 10.4 12/2013. She is on lantus 67U qHS and novolog 17U TID w/ meals. Also on gabapentin 300mg  TID at home. Despite starting a diet, she has not taken in much by mouth and has therefore not required much insulin. Her Lantus was held this admission and she only required about 4-5 units of Novolog a day total. Restarting her Lantus at discharge but at a fraction of her home dose, 5 units qhs. This will definitely need to be increased once her appetite improves and she is eating more. - Restarting Lantus at 5 units qhs - CBGs TID before meals at home - She was advised to take 2 units of the Novolog if her blood sugars are >150, and if they remain presistently elevated, she was asked to call the clinic. - Restarting home gabapentin   # H/o CAD: Patient with cath in 2011 that showed nonobstructive CAD. Patient with risk factor for ACS including DM, HTN, HLD so ruled out this admission. Troponin negative x3 and EKG w/o significant changes.  # dCHF: Patient with grade 1 diastolic dysfunction with preserved EF as of Echo 06/2013. Her lasix was held this admission, and we gently hydrated with IVF while she was NPO. She remained euvolemic this admission w/o concern for  exacerbation. Her Lasix was  restarted today and she is to continue it at discharge.  # HTN: BP slightly elevated this admission. Patient is on  lasix 40mg , IMDUR 60mg , cozaar 50mg , and metoprolol 25mg  daily, with a clonidine patch at home. We held all po meds until she was able to tolerate po, but did continue her Clonidine patch. The BB and Lasix were restarted prior to discharge and she is to resume the rest of her meds tomorrow.  - Continue all home med after discharge  #CKD stage 3: Baseline Cr around 1.5. Cr on admission 1.17.   # Depression/anxiety: Mood stable.  - Restarted home prozac and wellbutrin once she was tolerating po     Discharge Vitals:   BP 144/72  Pulse 75  Temp(Src) 98.5 F (36.9 C) (Oral)  Resp 16  Ht 5\' 5"  (1.651 m)  Wt 205 lb (92.987 kg)  BMI 34.11 kg/m2  SpO2 100%  Discharge Labs:  Results for orders placed during the hospital encounter of 01/30/14 (from the past 24 hour(s))  GLUCOSE, CAPILLARY     Status: Abnormal   Collection Time    02/01/14  4:34 PM      Result Value Ref Range   Glucose-Capillary 121 (*) 70 - 99 mg/dL  GLUCOSE, CAPILLARY     Status: Abnormal   Collection Time    02/01/14  9:48 PM      Result Value Ref Range   Glucose-Capillary 136 (*) 70 - 99 mg/dL   Comment 1 Documented in Chart     Comment 2 Notify RN    GLUCOSE, CAPILLARY     Status: Abnormal   Collection Time    02/02/14  7:36 AM      Result Value Ref Range   Glucose-Capillary 149 (*) 70 - 99 mg/dL  GLUCOSE, CAPILLARY     Status: Abnormal   Collection Time    02/02/14  1:25 PM      Result Value Ref Range   Glucose-Capillary 142 (*) 70 - 99 mg/dL    Signed: Genelle Gather, MD 02/02/2014, 3:05 PM   Time Spent on Discharge: 35 minutes Services Ordered on Discharge: None Equipment Ordered on Discharge: None

## 2014-01-31 NOTE — Progress Notes (Signed)
Pharmacist unable to verify oral CT contrast in the computer - spoke with pharmacist on the phone - state okay to give contrast. Scanned in MAR.

## 2014-01-31 NOTE — Progress Notes (Signed)
UR completed 

## 2014-01-31 NOTE — Progress Notes (Signed)
Subjective:  Overnight [name] had an episode of itching all over her body which was not relieved with benadryl but was improved with loratidine. She states that the itching has been under control but is beginning to start again and that she is in less pain then before reporting her pain at a 7/10. She has not had another bowel movement overnight and has been urinating without difficulty. [name] denies sob, nausea, vomiting, diarrhea, constipation, dysuria, or dizziness.  Objective: Vital signs in last 24 hours: Filed Vitals:   01/30/14 1830 01/30/14 1832 01/30/14 2009 01/31/14 0402  BP: 161/84 158/86 137/78 145/78  Pulse:   70 66  Temp:   98.5 F (36.9 C) 98.2 F (36.8 C)  TempSrc:   Oral Oral  Resp:   16 16  Height:      Weight:      SpO2:   96% 98%   Weight change:   Intake/Output Summary (Last 24 hours) at 01/31/14 0902 Last data filed at 01/31/14 1610  Gross per 24 hour  Intake 951.25 ml  Output    250 ml  Net 701.25 ml   Physical Exam: General: Resting comfortably in bed in no acute distress HEENT: PEERL, MMM Neck: Supple, no lymphadenopathy Cardio: RRR, No M/R/G Pulm: Lungs clear to auscultation bilaterally Abdominal: Soft, tender periumbilical and LUQ upon palpation, midline periumbilical scar from previous surgery, normal abdominal bowel sounds GU: No CVA tenderness Extremities: 2+ pedal and radial pulses, no edema Neuro: Alert and Oriented x3   Lab Results: @LABTEST2 @ Micro Results: No results found for this or any previous visit (from the past 240 hour(s)). Studies/Results: Dg Chest 2 View  01/30/2014   CLINICAL DATA:  abdominal pain, R/O PNA  EXAM: CHEST  2 VIEW  COMPARISON:  DG CHEST 2 VIEW dated 08/08/2013  FINDINGS: The heart size and mediastinal contours are within normal limits. Both lungs are clear. The visualized skeletal structures are unremarkable.  IMPRESSION: No active cardiopulmonary disease.   Electronically Signed   By: Salome Holmes  M.D.   On: 01/30/2014 16:29   Ct Abdomen Pelvis W Contrast  01/31/2014   CLINICAL DATA:  Acute abdominal pain.  EXAM: CT ABDOMEN AND PELVIS WITH CONTRAST  TECHNIQUE: Multidetector CT imaging of the abdomen and pelvis was performed using the standard protocol following bolus administration of intravenous contrast.  CONTRAST:  80mL OMNIPAQUE IOHEXOL 300 MG/ML  SOLN  COMPARISON:  08/08/2013  FINDINGS: BODY WALL: There is a periumbilical hernia, with 2 closely neighboring sacs, that contains nonobstructed small bowel. There are additional smaller, fatty hernias in the upper abdominal midline.  LOWER CHEST: Coronary atherosclerosis.  Mild dependent atelectasis.  ABDOMEN/PELVIS:  Liver: No focal abnormality.  Biliary: No evidence of biliary obstruction or stone.  Pancreas: Unremarkable.  Spleen: Unremarkable.  Adrenals: Unremarkable.  Kidneys and ureters: No hydronephrosis or stone.  Bladder: Unremarkable.  Reproductive: Hysterectomy.  Bowel: Enteroenterostomy noted in the central abdomen. No obstruction. Negative appendix.  Retroperitoneum: No mass or adenopathy.  Peritoneum: No free fluid or gas.  Vascular: Diffuse aortic and branch vessel atherosclerosis.  OSSEOUS: No acute abnormalities.  IMPRESSION: 1. No acute intra-abdominal findings. 2. Periumbilical hernia containing nonobstructed small bowel.   Electronically Signed   By: Tiburcio Pea M.D.   On: 01/31/2014 03:10   Dg Abd 2 Views  01/30/2014   CLINICAL DATA:  Abdominal pain.  EXAM: ABDOMEN - 2 VIEW  COMPARISON:  CT abdomen and pelvis 08/08/2013. Plain films the abdomen 01/13/2013.  FINDINGS: No  free intraperitoneal air is identified. The bowel gas pattern is normal. Injection granulomata in the buttocks are identified as on CT scan. No focal bony abnormality is identified.  IMPRESSION: Negative exam.   Electronically Signed   By: Drusilla Kannerhomas  Dalessio M.D.   On: 01/30/2014 16:26   Medications:  Scheduled: . cloNIDine  0.2 mg Transdermal Q7 days  .  enoxaparin (LOVENOX) injection  40 mg Subcutaneous Q24H  . insulin aspart  0-15 Units Subcutaneous 6 times per day  . pantoprazole (PROTONIX) IV  40 mg Intravenous QHS  . sodium chloride  3 mL Intravenous Q12H   Continuous:  ZOX:[id number]:acetaminophen, acetaminophen, hydrALAZINE, morphine injection, ondansetron (ZOFRAN) IV, ondansetron Scheduled Meds: . cloNIDine  0.2 mg Transdermal Q7 days  . enoxaparin (LOVENOX) injection  40 mg Subcutaneous Q24H  . insulin aspart  0-15 Units Subcutaneous 6 times per day  . pantoprazole (PROTONIX) IV  40 mg Intravenous QHS  . sodium chloride  3 mL Intravenous Q12H   Continuous Infusions:  PRN Meds:.acetaminophen, acetaminophen, hydrALAZINE, morphine injection, ondansetron (ZOFRAN) IV, ondansetron  Assessment/Plan: [name] is a 63 y/o woman with a chief complaint of severe abdominal pain and past medical history significant for hypertension, coronary artery disease, poorly controlled diabetes, chronic kidney disease stage II, history of bowel obstruction status post laparotomy with the lysis of adhesions, small bowel resection in march 2014.   Principal Problem:   Abdominal pain, acute Active Problems:   Uncontrolled type 2 diabetes mellitus with insulin therapy   HYPERTENSION   CKD (chronic kidney disease) stage 3, GFR 30-59 ml/min   Chronic chest pain   Abdominal pain  #Acute Abdominal Pain: Patient has severe diffuse abdominal pain which is more tender in the epigastric/periumbilical region with no fever or constipation. Abdominal X-ray shows no free intraperitoneal air or bowel obstruction making SBO obstruction less likely. CT shows no acute intra-abdominal findings and a periumbilical hernia with non-obstructed small bowel. Ruptured AAA contained by a hematoma less likely per CT. Atypical presentation of MI less likely due to negative trending troponin and stable EKGs. Appendicitis is not possible, patient has had an appendectomy. LFTs are within a  normal range and pain is not located in the RUQ making a infection or disease per liver or gallbladder etiology unlikely. Lipase levels are slightly elevated, but there is no pain which radiates to the back and patient denies alcohol use for many years and does not have signs of gallstone obstruction via LFTs making acute pancreatitis less likely. Chronic pancreatitis unlikely due to no calcifications seen on abdominal x-ray, CT scan, and from lack of alcohol use. UTI unlikely due to clinical exam and ROS, will obtain an UA. Patient reports a history of acute pancreatitis in her past when she used alcohol.  - CT with contrast to rule out AAA, Appendicitis, and Pancratitis (Speak with nephrology first about whether or not patient can have contrast)  - Negative troponin levels x3 - CBC w/diff daily  - CMP daily  - Obtain Ferritin levels - UA show protein and hyaline casts - Start morphine PRN Q4h for pain  - Start Telemetry for 24 hours to monitor for abnormal tracings  - 12-lead EKG reading  - Zofran PRN for nausea/vomiting Q4hrs  - Advance diet from NPO to clears - D/C IV fluids - Consult general surgery, recs appreciated  #Headache: Diffuse tension headache located bilaterally in the frontal and occipital areas.  - D/C Morphine PRN and switch to PO percocet due to itching - Consider  tylenol when able to tolerate PO   #Itching: Began to have itching overnight, most likely due to morphine - D/C morphine - Start percocet PO Q4h PRN - Continue Loratadine PRN - Benadryl did not help with itching  #Hypertension: Patient has uncontrolled hypertension and has had elevated BP readings since admission.  - Continue clonidine patch  - Hydralazine PRN for SBP > 170 or DBP >110. Hold if HR > 95.   #Diabetes - uncontrolled: Patient has had blood sugars ranging from 250 to 59 last week. Previous A1c of 10.4.  - SSI, sensitive  - CBG monitoring Q4 hours   #Chronic Kidney Disease Stage 3: Serum  creatinine at 1.17, which is less than her typical baseline which is typically ~ 1.5  - Avoid nephrotoxic medications  - Trend serum creatinine daily   #DVT PPX:  - Lovenox   #Dispo: Disposition is deferred at this time, awaiting improvement of current medical problems. Anticipated discharge in approximately 1-3 day(s).    This is a Psychologist, occupational Note.  The care of the patient was discussed with Dr. Darci Needle and the assessment and plan formulated with their assistance.  Please see their attached note for official documentation of the daily encounter.   LOS: 1 day   Camille Bal, Med Student 01/31/2014, 9:02 AM

## 2014-01-31 NOTE — H&P (Signed)
Internal Medicine Attending Admission Note Date: 01/31/2014  Patient name: [name] Medical record number: [id number] Date of birth: 1951/08/25 Age: 63 y.o. Gender: female  I saw and evaluated the patient. I reviewed the resident's note and I agree with the resident's findings and plan as documented in the resident's note, with the following additional comments.  Chief Complaint(s): Abdominal pain  History - key components related to admission: Patient is a 63 year old woman with history of diabetes mellitus, hypertension, hyperlipidemia, nonobstructive coronary artery disease, GERD, small bowel obstruction in March of 2014 status post exploratory laparotomy with lysis of adhesions and small bowel resection on 01/10/2013, and other problems as outlined in the medical history admitted with complaint of abdominal pain which started on the day prior to admission.  Her pain is mainly in the periumbilical area and epigastric area.  She reports nausea but no vomiting.  She reports 2 bowel movements on the day of admission; she denies visible blood or melena.  Patient also reports some pain in her upper chest aggravated by positional changes.  She denies any exertional chest pain or shortness of breath.    Physical Exam - key components related to admission:  Filed Vitals:   01/30/14 1830 01/30/14 1832 01/30/14 2009 01/31/14 0402  BP: 161/84 158/86 137/78 145/78  Pulse:   70 66  Temp:   98.5 F (36.9 C) 98.2 F (36.8 C)  TempSrc:   Oral Oral  Resp:   16 16  Height:      Weight:      SpO2:   96% 98%   General: Alert, oriented Lungs: Clear Chest: Tender to palpation in upper midsternal area, which patient says reproduces her chest pain Heart: Regular; no extra sounds or murmurs Abdomen: Bowel sounds present, soft; moderate periumbilical tenderness and moderate epigastric tenderness; no rebound; no hepatosplenomegaly  Extremities: Trace ankle edema  Lab results:   Basic Metabolic  Panel:  Recent Labs  01/30/14 1248  NA 140  K 5.2  CL 104  CO2 22  GLUCOSE 206*  BUN 22  CREATININE 1.17*  CALCIUM 9.9    Liver Function Tests:  Recent Labs  01/30/14 1248  AST 16  ALT 15  ALKPHOS 92  BILITOT <0.2*  PROT 8.1  ALBUMIN 3.6    Recent Labs  01/30/14 1248  LIPASE 66*    CBC:  Recent Labs  01/30/14 1248  WBC 6.9  HGB 11.2*  HCT 34.2*  MCV 74.7*  PLT 270    Recent Labs  01/30/14 1248  NEUTROABS 3.6  LYMPHSABS 2.7  MONOABS 0.3  EOSABS 0.3  BASOSABS 0.0    Cardiac Enzymes:  Recent Labs  01/30/14 1516 01/30/14 2158 01/31/14 0315  TROPONINI <0.30 <0.30 <0.30    CBG:  Recent Labs  01/30/14 1120 01/30/14 1756 01/30/14 2011 01/31/14 0021 01/31/14 0407 01/31/14 0737  GLUCAP 191* 146* 115* 108* 123* 120*       Urinalysis    Component Value Date/Time   COLORURINE YELLOW 01/30/2014 1807   APPEARANCEUR HAZY* 01/30/2014 1807   LABSPEC 1.022 01/30/2014 1807   PHURINE 5.5 01/30/2014 1807   GLUCOSEU NEGATIVE 01/30/2014 1807   GLUCOSEU NEG mg/dL 40/06/8118 1478   HGBUR NEGATIVE 01/30/2014 1807   BILIRUBINUR NEGATIVE 01/30/2014 1807   KETONESUR NEGATIVE 01/30/2014 1807   PROTEINUR 30* 01/30/2014 1807   UROBILINOGEN 0.2 01/30/2014 1807   NITRITE NEGATIVE 01/30/2014 1807   LEUKOCYTESUR NEGATIVE 01/30/2014 1807    Urine microscopic:  Recent Labs  01/30/14 1807  EPIU FEW*  WBCU 0-2  BACTERIA RARE  LABCAST HYALINE CASTS*  OTHERU MUCOUS PRESENT     Imaging results:  Dg Chest 2 View  01/30/2014   CLINICAL DATA:  abdominal pain, R/O PNA  EXAM: CHEST  2 VIEW  COMPARISON:  DG CHEST 2 VIEW dated 08/08/2013  FINDINGS: The heart size and mediastinal contours are within normal limits. Both lungs are clear. The visualized skeletal structures are unremarkable.  IMPRESSION: No active cardiopulmonary disease.   Electronically Signed   By: Salome HolmesHector  Cooper M.D.   On: 01/30/2014 16:29   Ct Abdomen Pelvis W Contrast  01/31/2014   CLINICAL DATA:  Acute  abdominal pain.  EXAM: CT ABDOMEN AND PELVIS WITH CONTRAST  TECHNIQUE: Multidetector CT imaging of the abdomen and pelvis was performed using the standard protocol following bolus administration of intravenous contrast.  CONTRAST:  80mL OMNIPAQUE IOHEXOL 300 MG/ML  SOLN  COMPARISON:  08/08/2013  FINDINGS: BODY WALL: There is a periumbilical hernia, with 2 closely neighboring sacs, that contains nonobstructed small bowel. There are additional smaller, fatty hernias in the upper abdominal midline.  LOWER CHEST: Coronary atherosclerosis.  Mild dependent atelectasis.  ABDOMEN/PELVIS:  Liver: No focal abnormality.  Biliary: No evidence of biliary obstruction or stone.  Pancreas: Unremarkable.  Spleen: Unremarkable.  Adrenals: Unremarkable.  Kidneys and ureters: No hydronephrosis or stone.  Bladder: Unremarkable.  Reproductive: Hysterectomy.  Bowel: Enteroenterostomy noted in the central abdomen. No obstruction. Negative appendix.  Retroperitoneum: No mass or adenopathy.  Peritoneum: No free fluid or gas.  Vascular: Diffuse aortic and branch vessel atherosclerosis.  OSSEOUS: No acute abnormalities.  IMPRESSION: 1. No acute intra-abdominal findings. 2. Periumbilical hernia containing nonobstructed small bowel.   Electronically Signed   By: Tiburcio PeaJonathan  Watts M.D.   On: 01/31/2014 03:10   Dg Abd 2 Views  01/30/2014   CLINICAL DATA:  Abdominal pain.  EXAM: ABDOMEN - 2 VIEW  COMPARISON:  CT abdomen and pelvis 08/08/2013. Plain films the abdomen 01/13/2013.  FINDINGS: No free intraperitoneal air is identified. The bowel gas pattern is normal. Injection granulomata in the buttocks are identified as on CT scan. No focal bony abnormality is identified.  IMPRESSION: Negative exam.   Electronically Signed   By: Drusilla Kannerhomas  Dalessio M.D.   On: 01/30/2014 16:26    Other results: EKG: Normal sinus rhythm; normal ECG; no significant change since last tracing  Assessment & Plan by Problem:  1.  Abdominal pain, mainly periumbilical  and epigastric.  There is no evidence of small bowel obstruction or other acute intra-abdominal process on CT scan.  The mildly elevated lipase is nonspecific especially in the setting of renal insufficiency.  Although there is no evidence of obstruction, her pain may be due to the periumbilical hernia.  Plan is general surgery consult for their opinion about this; recheck lipase today.  2.  Atypical chest pain.  There is no evidence of acute coronary syndrome by enzymes or EKGs.  Patient is tender in the upper mid sternal area, and palpation reproduces her pain.  This is unlikely to be cardiac in origin.  Plan is symptomatic treatment.  3.  Other problems and plans as per the resident physician's note.

## 2014-02-01 DIAGNOSIS — K429 Umbilical hernia without obstruction or gangrene: Secondary | ICD-10-CM | POA: Diagnosis not present

## 2014-02-01 LAB — BASIC METABOLIC PANEL
BUN: 19 mg/dL (ref 6–23)
CHLORIDE: 105 meq/L (ref 96–112)
CO2: 22 mEq/L (ref 19–32)
Calcium: 9.4 mg/dL (ref 8.4–10.5)
Creatinine, Ser: 1.2 mg/dL — ABNORMAL HIGH (ref 0.50–1.10)
GFR calc non Af Amer: 47 mL/min — ABNORMAL LOW (ref >90)
GFR, EST AFRICAN AMERICAN: 55 mL/min — AB (ref >90)
Glucose, Bld: 115 mg/dL — ABNORMAL HIGH (ref 70–99)
Potassium: 4 mEq/L (ref 3.7–5.3)
SODIUM: 141 meq/L (ref 137–147)

## 2014-02-01 LAB — GLUCOSE, CAPILLARY
GLUCOSE-CAPILLARY: 111 mg/dL — AB (ref 70–99)
GLUCOSE-CAPILLARY: 136 mg/dL — AB (ref 70–99)
GLUCOSE-CAPILLARY: 98 mg/dL (ref 70–99)
Glucose-Capillary: 119 mg/dL — ABNORMAL HIGH (ref 70–99)
Glucose-Capillary: 136 mg/dL — ABNORMAL HIGH (ref 70–99)
Glucose-Capillary: 121 mg/dL — ABNORMAL HIGH (ref 70–99)

## 2014-02-01 MED ORDER — DEXTROSE-NACL 5-0.45 % IV SOLN
INTRAVENOUS | Status: DC
  Administered 2014-02-01: 1000 mL via INTRAVENOUS

## 2014-02-01 MED ORDER — OXYCODONE-ACETAMINOPHEN 5-325 MG PO TABS
1.0000 | ORAL_TABLET | Freq: Four times a day (QID) | ORAL | Status: DC | PRN
  Administered 2014-02-01 (×2): 2 via ORAL
  Filled 2014-02-01 (×2): qty 2

## 2014-02-01 MED ORDER — INSULIN ASPART 100 UNIT/ML ~~LOC~~ SOLN
0.0000 [IU] | Freq: Three times a day (TID) | SUBCUTANEOUS | Status: DC
  Administered 2014-02-02 (×2): 1 [IU] via SUBCUTANEOUS

## 2014-02-01 NOTE — Progress Notes (Signed)
I have seen the patient and reviewed the daily progress note by Jimmy Chen MS3 and discussed the care of the patient with them.  See my note for documentation of my findings, assessment, and plans. 

## 2014-02-01 NOTE — Progress Notes (Signed)
Internal Medicine Attending  Date: 02/01/2014  Patient name: [name] Medical record number: [id number] Date of birth: 29-Mar-1951 Age: 63 y.o. Gender: female  I saw and evaluated the patient, and discussed her care on A.M rounds with housestaff.  I reviewed the resident's note by Dr. Darci Needlehikowski and I agree with the resident's findings and plans as documented in her note.

## 2014-02-01 NOTE — Progress Notes (Signed)
Case discussed and patient seen with Dr. Benita StabileBogalla at time of visit.  We reviewed the resident's history and exam and pertinent patient test results.  I agree with the assessment, diagnosis, and plan of care documented in the resident's note.

## 2014-02-01 NOTE — Progress Notes (Signed)
I have seen and examined the pt and agree with NP-Riebock's progress note. No obstructive sx Can f/u as outpt for repair

## 2014-02-01 NOTE — Progress Notes (Signed)
Subjective: Less pain today, but still sore.  No n/v.    Objective: Vital signs in last 24 hours: Temp:  [98.2 F (36.8 C)-98.9 F (37.2 C)] 98.5 F (36.9 C) (04/10 0421) Pulse Rate:  [75-86] 75 (04/10 0421) Resp:  [15-16] 15 (04/10 0421) BP: (122-167)/(67-85) 122/69 mmHg (04/10 0421) SpO2:  [93 %-99 %] 95 % (04/10 0421) Last BM Date: 01/30/14  Intake/Output from previous day: 04/09 0701 - 04/10 0700 In: 865 [I.V.:865] Out: 1600 [Urine:1600] Intake/Output this shift: Total I/O In: -  Out: 400 [Urine:400]   PE General appearance: alert, cooperative and no distress GI: +bs, abdomen is soft, easily reducible umbilical hernia.    Lab Results:   Recent Labs  01/30/14 1248  WBC 6.9  HGB 11.2*  HCT 34.2*  PLT 270   BMET  Recent Labs  01/31/14 0950 02/01/14 0716  NA 138 141  K 4.5 4.0  CL 102 105  CO2 21 22  GLUCOSE 165* 115*  BUN 18 19  CREATININE 1.17* 1.20*  CALCIUM 9.3 9.4   PT/INR No results found for this basename: LABPROT, INR,  in the last 72 hours ABG No results found for this basename: PHART, PCO2, PO2, HCO3,  in the last 72 hours  Studies/Results: Dg Chest 2 View  01/30/2014   CLINICAL DATA:  abdominal pain, R/O PNA  EXAM: CHEST  2 VIEW  COMPARISON:  DG CHEST 2 VIEW dated 08/08/2013  FINDINGS: The heart size and mediastinal contours are within normal limits. Both lungs are clear. The visualized skeletal structures are unremarkable.  IMPRESSION: No active cardiopulmonary disease.   Electronically Signed   By: Salome HolmesHector  Cooper M.D.   On: 01/30/2014 16:29   Ct Abdomen Pelvis W Contrast  01/31/2014   CLINICAL DATA:  Acute abdominal pain.  EXAM: CT ABDOMEN AND PELVIS WITH CONTRAST  TECHNIQUE: Multidetector CT imaging of the abdomen and pelvis was performed using the standard protocol following bolus administration of intravenous contrast.  CONTRAST:  80mL OMNIPAQUE IOHEXOL 300 MG/ML  SOLN  COMPARISON:  08/08/2013  FINDINGS: BODY WALL: There is a  periumbilical hernia, with 2 closely neighboring sacs, that contains nonobstructed small bowel. There are additional smaller, fatty hernias in the upper abdominal midline.  LOWER CHEST: Coronary atherosclerosis.  Mild dependent atelectasis.  ABDOMEN/PELVIS:  Liver: No focal abnormality.  Biliary: No evidence of biliary obstruction or stone.  Pancreas: Unremarkable.  Spleen: Unremarkable.  Adrenals: Unremarkable.  Kidneys and ureters: No hydronephrosis or stone.  Bladder: Unremarkable.  Reproductive: Hysterectomy.  Bowel: Enteroenterostomy noted in the central abdomen. No obstruction. Negative appendix.  Retroperitoneum: No mass or adenopathy.  Peritoneum: No free fluid or gas.  Vascular: Diffuse aortic and branch vessel atherosclerosis.  OSSEOUS: No acute abnormalities.  IMPRESSION: 1. No acute intra-abdominal findings. 2. Periumbilical hernia containing nonobstructed small bowel.   Electronically Signed   By: Tiburcio PeaJonathan  Watts M.D.   On: 01/31/2014 03:10   Dg Abd 2 Views  01/30/2014   CLINICAL DATA:  Abdominal pain.  EXAM: ABDOMEN - 2 VIEW  COMPARISON:  CT abdomen and pelvis 08/08/2013. Plain films the abdomen 01/13/2013.  FINDINGS: No free intraperitoneal air is identified. The bowel gas pattern is normal. Injection granulomata in the buttocks are identified as on CT scan. No focal bony abnormality is identified.  IMPRESSION: Negative exam.   Electronically Signed   By: Drusilla Kannerhomas  Dalessio M.D.   On: 01/30/2014 16:26    Anti-infectives: Anti-infectives   None      Assessment/Plan: Hx laparotomy SBR,  LOA 12/2012  Incisional hernia, easily reducible D/w Dr. Lindie Spruce, she is not obstructed and the hernia is reducible.  Advance diet Outpatient follow up for elective repair of hernia   LOS: 2 days    Emma Schupp ANP-BC 02/01/2014 10:51 AM

## 2014-02-01 NOTE — Progress Notes (Signed)
Subjective: Patient with mild improvement of abd pain. Dilaudid seems to be helping. No N/V. She is passing flatus. Patient's main complaint is itching.   Objective: Vital signs in last 24 hours: Filed Vitals:   01/31/14 0402 01/31/14 1322 01/31/14 2021 02/01/14 0421  BP: 145/78 167/67 156/85 122/69  Pulse: 66 83 86 75  Temp: 98.2 F (36.8 C) 98.2 F (36.8 C) 98.9 F (37.2 C) 98.5 F (36.9 C)  TempSrc: Oral Oral Oral Oral  Resp: 16 16 16 15   Height:      Weight:      SpO2: 98% 99% 93% 95%   Weight change:   Intake/Output Summary (Last 24 hours) at 02/01/14 0750 Last data filed at 02/01/14 0600  Gross per 24 hour  Intake    865 ml  Output   1600 ml  Net   -735 ml   Physical Exam General: alert, cooperative, NAD; scratching her arms and head HEENT: pupils equal round and reactive to light, vision grossly intact, oropharynx clear and non-erythematous, MMM  Neck: supple Lungs: CTAB Heart: regular rate and rhythm, no murmurs, gallops, or rubs Abdomen: soft, mild TTP periumbilical/LUQ, non-distended, normal bowel sounds; no rebound/guarding; no palpable masses Extremities: warm, no pedal edema Neurologic: alert & oriented X3, cranial nerves II-XII grossly intact, strength grossly intact, sensation intact to light touch  Lab Results: Basic Metabolic Panel:  Recent Labs Lab 01/30/14 1248 01/31/14 0950  NA 140 138  K 5.2 4.5  CL 104 102  CO2 22 21  GLUCOSE 206* 165*  BUN 22 18  CREATININE 1.17* 1.17*  CALCIUM 9.9 9.3   Liver Function Tests:  Recent Labs Lab 01/30/14 1248  AST 16  ALT 15  ALKPHOS 92  BILITOT <0.2*  PROT 8.1  ALBUMIN 3.6    Recent Labs Lab 01/30/14 1248 01/31/14 0950  LIPASE 66* 78*   CBC:  Recent Labs Lab 01/30/14 1248  WBC 6.9  NEUTROABS 3.6  HGB 11.2*  HCT 34.2*  MCV 74.7*  PLT 270   Cardiac Enzymes:  Recent Labs Lab 01/30/14 1516 01/30/14 2158 01/31/14 0315  TROPONINI <0.30 <0.30 <0.30   CBG:  Recent  Labs Lab 01/31/14 0737 01/31/14 1155 01/31/14 1645 01/31/14 2017 02/01/14 0019 02/01/14 0416  GLUCAP 120* 159* 125* 125* 98 111*   Urinalysis:  Recent Labs Lab 01/30/14 1807  COLORURINE YELLOW  LABSPEC 1.022  PHURINE 5.5  GLUCOSEU NEGATIVE  HGBUR NEGATIVE  BILIRUBINUR NEGATIVE  KETONESUR NEGATIVE  PROTEINUR 30*  UROBILINOGEN 0.2  NITRITE NEGATIVE  LEUKOCYTESUR NEGATIVE   Studies/Results: Dg Chest 2 View  01/30/2014   CLINICAL DATA:  abdominal pain, R/O PNA  EXAM: CHEST  2 VIEW  COMPARISON:  DG CHEST 2 VIEW dated 08/08/2013  FINDINGS: The heart size and mediastinal contours are within normal limits. Both lungs are clear. The visualized skeletal structures are unremarkable.  IMPRESSION: No active cardiopulmonary disease.   Electronically Signed   By: Salome Holmes M.D.   On: 01/30/2014 16:29   Ct Abdomen Pelvis W Contrast  01/31/2014   CLINICAL DATA:  Acute abdominal pain.  EXAM: CT ABDOMEN AND PELVIS WITH CONTRAST  TECHNIQUE: Multidetector CT imaging of the abdomen and pelvis was performed using the standard protocol following bolus administration of intravenous contrast.  CONTRAST:  80mL OMNIPAQUE IOHEXOL 300 MG/ML  SOLN  COMPARISON:  08/08/2013  FINDINGS: BODY WALL: There is a periumbilical hernia, with 2 closely neighboring sacs, that contains nonobstructed small bowel. There are additional smaller, fatty hernias in  the upper abdominal midline.  LOWER CHEST: Coronary atherosclerosis.  Mild dependent atelectasis.  ABDOMEN/PELVIS:  Liver: No focal abnormality.  Biliary: No evidence of biliary obstruction or stone.  Pancreas: Unremarkable.  Spleen: Unremarkable.  Adrenals: Unremarkable.  Kidneys and ureters: No hydronephrosis or stone.  Bladder: Unremarkable.  Reproductive: Hysterectomy.  Bowel: Enteroenterostomy noted in the central abdomen. No obstruction. Negative appendix.  Retroperitoneum: No mass or adenopathy.  Peritoneum: No free fluid or gas.  Vascular: Diffuse aortic and  branch vessel atherosclerosis.  OSSEOUS: No acute abnormalities.  IMPRESSION: 1. No acute intra-abdominal findings. 2. Periumbilical hernia containing nonobstructed small bowel.   Electronically Signed   By: Tiburcio PeaJonathan  Watts M.D.   On: 01/31/2014 03:10   Dg Abd 2 Views  01/30/2014   CLINICAL DATA:  Abdominal pain.  EXAM: ABDOMEN - 2 VIEW  COMPARISON:  CT abdomen and pelvis 08/08/2013. Plain films the abdomen 01/13/2013.  FINDINGS: No free intraperitoneal air is identified. The bowel gas pattern is normal. Injection granulomata in the buttocks are identified as on CT scan. No focal bony abnormality is identified.  IMPRESSION: Negative exam.   Electronically Signed   By: Drusilla Kannerhomas  Dalessio M.D.   On: 01/30/2014 16:26   Medications: I have reviewed the patient's current medications. Scheduled Meds: . cloNIDine  0.2 mg Transdermal Q7 days  . enoxaparin (LOVENOX) injection  40 mg Subcutaneous Q24H  . insulin aspart  0-15 Units Subcutaneous 6 times per day  . pantoprazole (PROTONIX) IV  40 mg Intravenous QHS  . sodium chloride  3 mL Intravenous Q12H   Continuous Infusions: . sodium chloride 1,000 mL (01/31/14 1829)   PRN Meds:.acetaminophen, acetaminophen, diphenhydrAMINE, hydrALAZINE, HYDROmorphone (DILAUDID) injection, ondansetron (ZOFRAN) IV, ondansetron  Assessment/Plan:  # Epigastric abd pain with periumbilical hernia found on CT scan: Patient with improved, though not resolved. No N/V/D. No obstructive S/S. VSS. Surgery evaluated patient this AM and recommend advancing diet. Pt will need to follow up with surgery for outpatient elective hernia repair. Dilaudid likely the cause of her itching. Will try PO pain medications. -advance diet as tolerated- CLD today -d/c IVF -will try percocet today  -zofran prn  -protonix 40mg  IV (on dexlansoprazole 60mg  daily at home)  -benadryl prn for itching  # H/o CAD: Stable. ACS ruled out this admission.   # dCHF: Patient with grade 1 diastolic dysfunction  with preserved EF as of Echo 06/2013. Euvolemic currently and no concern for exacerbation.  -restart home lasix if tolerates diet   # HTN:  Patient is on IMDUR 60mg  daily, cozaar 50mg  daily, metoprolol 25mg  daily, clonidine patch, lasix 40mg  daily  -continue clonidine patch  -restart home antihypertensives if tolerating diet later today -hydralazine 5mg  IV prn for BP >170/110   #CKD stage 3: Baseline Cr around 1.5. Cr stable, 1.2 today.   # depression/anxiety: Mood stable.  -restart home prozac and wellbutrin if tolerates PO today  #DM type 2: Last A1c 10.4 12/2013. She is on lantus 67U qHS and novolog 17U TID w/ meals at home. Also on gabapentin 300mg  TID at home. CBGs 98-125 overnight while NPO (required 9U SSI). May need to add back some lantus since advancing diet. -SSI, mod  -hold lantus for now with plan to add back likely later today or tomorrow -hold gabapentin until sure she is tolerating PO  # VTE: lovenox   # Diet: CLD  Code status: full  Dispo: Disposition is deferred at this time, awaiting improvement of current medical problems.  Anticipated discharge in approximately 1  day(s).   The patient does have a current PCP (Neema Davina Poke, MD) and does need an [facility] hospital follow-up appointment after discharge.  The patient does not have transportation limitations that hinder transportation to clinic appointments.  .Services Needed at time of discharge: Y = Yes, Blank = No PT:   OT:   RN:   Equipment:   Other:     LOS: 2 days   Windell Hummingbird, MD 02/01/2014, 7:50 AM

## 2014-02-01 NOTE — Progress Notes (Signed)
Subjective:  [name] notes that her pain continues to be a 7/10 and remains located in her periumbilical region and is slightly diffuse with more pain to the left quadrant. She also endorses itchiness which continues from yesterday. Linens and sheets have been changed so she does not believe that is the cause. She was seen by surgery NP yesterday and the physician will be coming by to see her this morning. Per surgery recommendations she has been put back on NPO. [name] states that she is still having a chest soreness which she points to is in between her clavicles, midsternum. She says that her headache has resolved and states that she has not had any sob, dysuria, nausea, vomiting, or diarrhea. She has not had a bowel movement since before her admission.  Objective: Vital signs in last 24 hours: Filed Vitals:   01/31/14 0402 01/31/14 1322 01/31/14 2021 02/01/14 0421  BP: 145/78 167/67 156/85 122/69  Pulse: 66 83 86 75  Temp: 98.2 F (36.8 C) 98.2 F (36.8 C) 98.9 F (37.2 C) 98.5 F (36.9 C)  TempSrc: Oral Oral Oral Oral  Resp: 16 16 16 15   Height:      Weight:      SpO2: 98% 99% 93% 95%   Weight change:   Intake/Output Summary (Last 24 hours) at 02/01/14 0819 Last data filed at 02/01/14 0600  Gross per 24 hour  Intake    865 ml  Output   1600 ml  Net   -735 ml   Physical Exam: General: Resting comfortably in bed in no acute distress HEENT: PEERL, MMM Neck: Supple, no lymphadenopathy Cardio: RRR, No M/R/G Pulm: Lungs clear to auscultation bilaterally Abdominal: Soft, Tender at periumbilical and epigastric regions upon palpation with diffuse tenderness throughout , normal abdominal bowel sounds, slightly distended Extremities: 2+ pedal and radial pulses, no edema Neuro: Alert and Oriented x3  Lab Results: @LABTEST2 @ Micro Results: No results found for this or any previous visit (from the past 240 hour(s)). Studies/Results: Dg Chest 2 View  01/30/2014   CLINICAL  DATA:  abdominal pain, R/O PNA  EXAM: CHEST  2 VIEW  COMPARISON:  DG CHEST 2 VIEW dated 08/08/2013  FINDINGS: The heart size and mediastinal contours are within normal limits. Both lungs are clear. The visualized skeletal structures are unremarkable.  IMPRESSION: No active cardiopulmonary disease.   Electronically Signed   By: Salome Holmes M.D.   On: 01/30/2014 16:29   Ct Abdomen Pelvis W Contrast  01/31/2014   CLINICAL DATA:  Acute abdominal pain.  EXAM: CT ABDOMEN AND PELVIS WITH CONTRAST  TECHNIQUE: Multidetector CT imaging of the abdomen and pelvis was performed using the standard protocol following bolus administration of intravenous contrast.  CONTRAST:  80mL OMNIPAQUE IOHEXOL 300 MG/ML  SOLN  COMPARISON:  08/08/2013  FINDINGS: BODY WALL: There is a periumbilical hernia, with 2 closely neighboring sacs, that contains nonobstructed small bowel. There are additional smaller, fatty hernias in the upper abdominal midline.  LOWER CHEST: Coronary atherosclerosis.  Mild dependent atelectasis.  ABDOMEN/PELVIS:  Liver: No focal abnormality.  Biliary: No evidence of biliary obstruction or stone.  Pancreas: Unremarkable.  Spleen: Unremarkable.  Adrenals: Unremarkable.  Kidneys and ureters: No hydronephrosis or stone.  Bladder: Unremarkable.  Reproductive: Hysterectomy.  Bowel: Enteroenterostomy noted in the central abdomen. No obstruction. Negative appendix.  Retroperitoneum: No mass or adenopathy.  Peritoneum: No free fluid or gas.  Vascular: Diffuse aortic and branch vessel atherosclerosis.  OSSEOUS: No acute abnormalities.  IMPRESSION: 1.  No acute intra-abdominal findings. 2. Periumbilical hernia containing nonobstructed small bowel.   Electronically Signed   By: Tiburcio PeaJonathan  Watts M.D.   On: 01/31/2014 03:10   Dg Abd 2 Views  01/30/2014   CLINICAL DATA:  Abdominal pain.  EXAM: ABDOMEN - 2 VIEW  COMPARISON:  CT abdomen and pelvis 08/08/2013. Plain films the abdomen 01/13/2013.  FINDINGS: No free intraperitoneal air  is identified. The bowel gas pattern is normal. Injection granulomata in the buttocks are identified as on CT scan. No focal bony abnormality is identified.  IMPRESSION: Negative exam.   Electronically Signed   By: Drusilla Kannerhomas  Dalessio M.D.   On: 01/30/2014 16:26   Medications:  Scheduled: . cloNIDine  0.2 mg Transdermal Q7 days  . enoxaparin (LOVENOX) injection  40 mg Subcutaneous Q24H  . insulin aspart  0-15 Units Subcutaneous 6 times per day  . pantoprazole (PROTONIX) IV  40 mg Intravenous QHS  . sodium chloride  3 mL Intravenous Q12H   Continuous: . sodium chloride 1,000 mL (02/01/14 0807)   ZOX:[id number]:acetaminophen, acetaminophen, diphenhydrAMINE, hydrALAZINE, HYDROmorphone (DILAUDID) injection, ondansetron (ZOFRAN) IV, ondansetron Scheduled Meds: . cloNIDine  0.2 mg Transdermal Q7 days  . enoxaparin (LOVENOX) injection  40 mg Subcutaneous Q24H  . insulin aspart  0-15 Units Subcutaneous 6 times per day  . pantoprazole (PROTONIX) IV  40 mg Intravenous QHS  . sodium chloride  3 mL Intravenous Q12H   Continuous Infusions: . sodium chloride 1,000 mL (02/01/14 0807)   PRN Meds:.acetaminophen, acetaminophen, diphenhydrAMINE, hydrALAZINE, HYDROmorphone (DILAUDID) injection, ondansetron (ZOFRAN) IV, ondansetron Assessment/Plan: Principal Problem:   Abdominal pain, acute Active Problems:   Uncontrolled type 2 diabetes mellitus with insulin therapy   HYPERTENSION   CKD (chronic kidney disease) stage 3, GFR 30-59 ml/min   Chronic chest pain   Abdominal pain  #Acute Abdominal Pain: Patient has severe diffuse abdominal pain which is more tender in the epigastric/periumbilical region with no fever or constipation. Abdominal X-ray shows no free intraperitoneal air or bowel obstruction making SBO obstruction less likely. CT shows no acute intra-abdominal findings and a periumbilical hernia with non-obstructed small bowel. Ruptured AAA contained by a hematoma less likely per CT. Atypical presentation of  MI less likely due to negative trending troponin and stable EKGs. Appendicitis is not possible, patient has had an appendectomy. LFTs are within a normal range and pain is not located in the RUQ making a infection or disease per liver or gallbladder etiology unlikely. Lipase levels are slightly elevated, but there is no pain which radiates to the back and patient denies alcohol use for many years and does not have signs of gallstone obstruction via LFTs making acute pancreatitis less likely. Chronic pancreatitis unlikely due to no calcifications seen on abdominal x-ray, CT scan, and from lack of alcohol use. UTI unlikely due to clinical exam and ROS, will obtain an UA. Patient reports a history of acute pancreatitis in her past when she used alcohol.  - CT with contrast to rule out AAA, Appendicitis, and Pancratitis (Speak with nephrology first about whether or not patient can have contrast)  - Negative troponin levels x3  - CBC w/diff daily   - CMP daily  - Obtain Ferritin levels  - UA show protein and hyaline casts  - Start morphine PRN Q4h for pain  - Start Telemetry for 24 hours to monitor for abnormal tracings  - 12-lead EKG reading  - Zofran PRN for nausea/vomiting Q4hrs  - Consult general surgery, recs appreciated - NPO and Continue IV  fluids per surgery  - Swith fluids to DS 1/2 normal @ 75 ml/hr  #Headache: Diffuse tension headache located bilaterally in the frontal and occipital areas. Resolved - D/C PO percocet to dilaudid due to itching  - Consider tylenol when able to tolerate PO   #Itching: Began to have itching overnight, most likely due to morphine. She was switched to IV dilaudid per surgery for NPO. - D/C morphine  - D/C percocet PO Q4h PRN  - Continue Dilaudid - Continue Loratadine PRN  - Benadryl did not help with itching   #Hypertension: Patient has uncontrolled hypertension and has had elevated BP readings since admission.  - Continue clonidine patch  - Hydralazine  PRN for SBP > 170 or DBP >110. Hold if HR > 95.   #Diabetes - uncontrolled: Patient has had blood sugars ranging from 250 to 59 last week. Previous A1c of 10.4.  - SSI, sensitive  - CBG monitoring Q4 hours   #Chronic Kidney Disease Stage 3: Serum creatinine at 1.17, which is less than her typical baseline which is typically ~ 1.5  - Avoid nephrotoxic medications  - Trend serum creatinine daily   #DVT PPX:  - Lovenox   #Dispo: Disposition is deferred at this time, awaiting improvement of current medical problems. Anticipated discharge in approximately 1-3 day(s).    This is a Psychologist, occupational Note.  The care of the patient was discussed with Dr. Darci Needle and the assessment and plan formulated with their assistance.  Please see their attached note for official documentation of the daily encounter.   LOS: 2 days   Camille Bal, Med Student 02/01/2014, 8:19 AM

## 2014-02-02 DIAGNOSIS — K429 Umbilical hernia without obstruction or gangrene: Secondary | ICD-10-CM | POA: Diagnosis present

## 2014-02-02 LAB — GLUCOSE, CAPILLARY
GLUCOSE-CAPILLARY: 149 mg/dL — AB (ref 70–99)
Glucose-Capillary: 142 mg/dL — ABNORMAL HIGH (ref 70–99)

## 2014-02-02 MED ORDER — METOPROLOL SUCCINATE ER 25 MG PO TB24
25.0000 mg | ORAL_TABLET | Freq: Every day | ORAL | Status: DC
  Administered 2014-02-02: 25 mg via ORAL
  Filled 2014-02-02: qty 1

## 2014-02-02 MED ORDER — HYDROCODONE-ACETAMINOPHEN 5-325 MG PO TABS: 1.0000 | ORAL_TABLET | Freq: Three times a day (TID) | ORAL | Status: DC | PRN

## 2014-02-02 MED ORDER — PANTOPRAZOLE SODIUM 20 MG PO TBEC
20.0000 mg | DELAYED_RELEASE_TABLET | Freq: Every day | ORAL | Status: DC
  Administered 2014-02-02: 20 mg via ORAL
  Filled 2014-02-02: qty 1

## 2014-02-02 MED ORDER — BUPROPION HCL ER (XL) 150 MG PO TB24
450.0000 mg | ORAL_TABLET | Freq: Every day | ORAL | Status: DC
  Administered 2014-02-02: 450 mg via ORAL
  Filled 2014-02-02: qty 1

## 2014-02-02 MED ORDER — FLUOXETINE HCL 20 MG PO CAPS
20.0000 mg | ORAL_CAPSULE | Freq: Every morning | ORAL | Status: DC
  Administered 2014-02-02: 20 mg via ORAL
  Filled 2014-02-02: qty 1

## 2014-02-02 MED ORDER — FUROSEMIDE 40 MG PO TABS
40.0000 mg | ORAL_TABLET | Freq: Every day | ORAL | Status: DC
  Administered 2014-02-02: 40 mg via ORAL
  Filled 2014-02-02: qty 1

## 2014-02-02 MED ORDER — ONDANSETRON HCL 4 MG PO TABS: 4.0000 mg | ORAL_TABLET | Freq: Four times a day (QID) | ORAL | Status: DC | PRN

## 2014-02-02 MED ORDER — ISOSORBIDE MONONITRATE ER 60 MG PO TB24: 60.0000 mg | ORAL_TABLET | Freq: Every day | ORAL | Status: DC

## 2014-02-02 NOTE — Progress Notes (Signed)
I have seen the patient and reviewed the daily progress note by Camille Bal, MS 3 and discussed the care of the patient with him.  See below for documentation of my findings, assessment, and plans.  Subjective: No acute events. General Surgery wants the pt to f/u as an outpatient for hernia repair. Pt feeling better overall. She denies any further itching on the percocet. The nausea and vomiting have resolved and she was able to tolerate a small amount of po this morning. However, she does endorse pain with po intake that is primarily at the site of her hernia. She wanted to see how she did with lunch before being ready to go home.   Objective: Vital signs in last 24 hours: Filed Vitals:   02/01/14 0421 02/01/14 1300 02/01/14 2011 02/02/14 0407  BP: 122/69 147/63 154/85 149/68  Pulse: 75 69 73 76  Temp: 98.5 F (36.9 C) 99.3 F (37.4 C) 98.2 F (36.8 C) 98.3 F (36.8 C)  TempSrc: Oral Oral Axillary Oral  Resp: 15 16 16 16   Height:      Weight:      SpO2: 95% 97% 98% 93%   Weight change:   Intake/Output Summary (Last 24 hours) at 02/02/14 1343 Last data filed at 02/02/14 1217  Gross per 24 hour  Intake    123 ml  Output   2700 ml  Net  -2577 ml   Vitals reviewed. General: Sitting in a chair, NAD HEENT: PERRL, EOMI, no scleral icterus Cardiac: RRR, no rubs, murmurs or gallops Pulm: Clear to auscultation bilaterally, no wheezes, rales, or rhonchi Abd: Soft, TTP at umbilicus at the site of her hernia (sen on CT imaging), +BS Ext: Warm and well perfused Neuro: AA&Ox4, cranial nerves II-XII grossly intact, strength and sensation equal in bilateral upper and lower extremities.  Lab Results: Reviewed and documented in Electronic Record Micro Results: Reviewed and documented in Electronic Record Studies/Results: Reviewed and documented in Electronic Record  Medications: I have reviewed the patient's current medications. Scheduled Meds: . cloNIDine  0.2 mg Transdermal Q7  days  . enoxaparin (LOVENOX) injection  40 mg Subcutaneous Q24H  . insulin aspart  0-9 Units Subcutaneous TID WC  . pantoprazole (PROTONIX) IV  40 mg Intravenous QHS  . sodium chloride  3 mL Intravenous Q12H   Continuous Infusions:  PRN Meds:.acetaminophen, acetaminophen, diphenhydrAMINE, hydrALAZINE, ondansetron (ZOFRAN) IV, ondansetron, oxyCODONE-acetaminophen  Assessment/Plan: 63yo F w/ PMH CHF, HTN, CAD, and previous SBO requiring exlap with bowel resection 12/2012 presents with severe abdominal pain.   # Periumbilical hernia with pain: Pt presented with severe abdominal pain from the clinic CT abdomin and pelvis revealed a periumbilical hernia that was not incarcerated.  She was not having any N/V/D on admission. However, she was made NPO and General Surgery was consulted for evaluation. They felt that the pt can f/u as an outpatient for hernia repair. advancing diet. Her pain has been fairly well controlled with oral pain medications. Her diet has been advanced, but she is still having some pain with food. Will see how she does this afternoon and hopefully send her home today.  - diet as tolerated - percocet  - zofran prn  - protonix 40mg  IV --> po  # H/o CAD: Stable. ACS ruled out this admission.   # dCHF: Patient with grade 1 diastolic dysfunction with preserved EF as of Echo 06/2013. Euvolemic currently and no concern for exacerbation.  -Restarting home lasix now that she is tolerating a diet   #  HTN: Slightly elevated BP today. Patient is on IMDUR 60mg  daily, cozaar 50mg  daily, metoprolol 25mg  daily, clonidine patch, lasix 40mg  daily  - Continue clonidine patch  - Restarting home lasix, and metoprolol  #CKD stage 3: Baseline Cr around 1.5. Cr stable.   Recent Labs Lab 01/30/14 1248 01/31/14 0950 02/01/14 0716  CREATININE 1.17* 1.17* 1.20*    # Depression/anxiety: Mood stable.  - Restarting home prozac and wellbutrin today now that she is tolerating po  #DM type 2: Last  A1c 10.4 12/2013. She is on lantus 67U qHS and novolog 17U TID w/ meals at home. Also on gabapentin 300mg  TID at home. May need to add back some lantus once she begins to eat more, but she only required 4u Novolog yesterday and 1 unit today so far.  - SSI, mod  - Holding lantus for now with plan to add back likely later today or tomorrow  - Will restart gabapentin as needed   # DVT PPx: Lovenox     Dispo: D/c to home today or tomorrow if tolerating a diet    The patient does have a current PCP (Neema Davina PokeK Sharda, MD) and [facility].  The patient does not have transportation limitations that hinder transportation to clinic appointments.  .Services Needed at time of discharge: Y = Yes, Blank = No PT:   OT:   RN:   Equipment:   Other:     LOS: 3 days   Genelle GatherKathryn F Seraiah Nowack, MD 02/02/2014, 1:43 PM

## 2014-02-02 NOTE — Progress Notes (Signed)
NURSING PROGRESS NOTE  [name] [id number] Discharge Data: 02/02/2014 4:05 PM Attending Provider: Farley LyJerry Dale Joines, MD WUJ:[id number]:SHARDA, Anselm LisNEEMA, MD     [name] to be D/C'd Home with husband per MD order.  Discussed with the patient the After Visit Summary and all questions fully answered. All IV's discontinued with no bleeding noted. All belongings returned to patient for patient to take home.   Last Vital Signs:  Blood pressure 161/79, pulse 82, temperature 98.5 F (36.9 C), temperature source Oral, resp. rate 16, height 5\' 5"  (1.651 m), weight 92.987 kg (205 lb), SpO2 100.00%.  Discharge Medication List   Medication List    STOP taking these medications       insulin aspart 100 UNIT/ML FlexPen  Commonly known as:  NOVOLOG FLEXPEN     Insulin Glargine 100 UNIT/ML Solostar Pen  Commonly known as:  LANTUS      TAKE these medications       aspirin 81 MG chewable tablet  Chew 81 mg by mouth daily.     buPROPion 150 MG 24 hr tablet  Commonly known as:  WELLBUTRIN XL  Take 3 tablets (450 mg total) by mouth daily.     cloNIDine 0.2 mg/24hr patch  Commonly known as:  CATAPRES - Dosed in mg/24 hr  Place 1 patch (0.2 mg total) onto the skin every 7 (seven) days.     COLACE PO  Take 2 tablets by mouth daily as needed (constipation).     DEXILANT 60 MG capsule  Generic drug:  dexlansoprazole  Take 60 mg by mouth daily.     FLUoxetine 20 MG capsule  Commonly known as:  PROZAC  Take 1 capsule (20 mg total) by mouth every morning.     furosemide 40 MG tablet  Commonly known as:  LASIX  Take 1 tablet (40 mg total) by mouth daily.     gabapentin 800 MG tablet  Commonly known as:  NEURONTIN  Take 800-1,600 mg by mouth 2 (two) times daily. 800 mg in the am and 1600 mg in the evening.     HYDROcodone-acetaminophen 5-325 MG per tablet  Commonly known as:  NORCO/VICODIN  Take 1 tablet by mouth 3 (three) times daily as needed for moderate pain.     isosorbide mononitrate 60 MG  24 hr tablet  Commonly known as:  IMDUR  Take 1 tablet (60 mg total) by mouth daily.     losartan 100 MG tablet  Commonly known as:  COZAAR  Take 0.5 tablets (50 mg total) by mouth daily.     metoprolol succinate 25 MG 24 hr tablet  Commonly known as:  TOPROL-XL  Take 1 tablet (25 mg total) by mouth daily.     ondansetron 4 MG tablet  Commonly known as:  ZOFRAN  Take 1 tablet (4 mg total) by mouth every 6 (six) hours as needed for nausea.     promethazine 25 MG tablet  Commonly known as:  PHENERGAN  Take 1 tablet (25 mg total) by mouth every 6 (six) hours as needed for nausea.     senna 8.6 MG Tabs tablet  Commonly known as:  SENOKOT  Take 4 tablets by mouth daily as needed (constipation).     temazepam 15 MG capsule  Commonly known as:  RESTORIL  take 1 capsule by mouth at bedtime if needed for sleep         Cathlyn Parsonsattha Jurell Basista, RN

## 2014-02-02 NOTE — Progress Notes (Signed)
Subjective:  [name] did not have any significant overnight events. She reports that her pain has decreased from yesterday at a 5-6/10. She reports that she has had a bowel movement overnight that was not bloody and that she has not had any pain or burning with urination. She also reports that she is not having anymore chest soreness or headache and denies sob, nausea, vomiting.   Objective: Vital signs in last 24 hours: Filed Vitals:   02/01/14 0421 02/01/14 1300 02/01/14 2011 02/02/14 0407  BP: 122/69 147/63 154/85 149/68  Pulse: 75 69 73 76  Temp: 98.5 F (36.9 C) 99.3 F (37.4 C) 98.2 F (36.8 C) 98.3 F (36.8 C)  TempSrc: Oral Oral Axillary Oral  Resp: 15 16 16 16   Height:      Weight:      SpO2: 95% 97% 98% 93%   Weight change:   Intake/Output Summary (Last 24 hours) at 02/02/14 0845 Last data filed at 02/02/14 [id number]  Gross per 24 hour  Intake      0 ml  Output   1300 ml  Net  -1300 ml   Physical Exam: General: Resting in bed on her right side in no acute distress HEENT: PEERL, MMM Neck: Supple, no lymphadenopathy Cardio: RRR, No M/R/G Pulm: Lungs clear to auscultation bilaterally Abdominal: Soft, Tender at periumbilical and epigastric regions upon palpation with diffuse tenderness throughout (less tender than previous exams), normal abdominal bowel sounds, slightly distended GU: No CVA tenderness Extremities: 2+ pedal and radial pulses, no edema Neuro: Alert and Oriented x3  Lab Results: @LABTEST2 @ Micro Results: No results found for this or any previous visit (from the past 240 hour(s)). Studies/Results: No results found. Medications:  Scheduled: . cloNIDine  0.2 mg Transdermal Q7 days  . enoxaparin (LOVENOX) injection  40 mg Subcutaneous Q24H  . insulin aspart  0-9 Units Subcutaneous TID WC  . pantoprazole (PROTONIX) IV  40 mg Intravenous QHS  . sodium chloride  3 mL Intravenous Q12H   Continuous:  JYN:[id number]:acetaminophen, acetaminophen, diphenhydrAMINE,  hydrALAZINE, ondansetron (ZOFRAN) IV, ondansetron, oxyCODONE-acetaminophen Scheduled Meds: . cloNIDine  0.2 mg Transdermal Q7 days  . enoxaparin (LOVENOX) injection  40 mg Subcutaneous Q24H  . insulin aspart  0-9 Units Subcutaneous TID WC  . pantoprazole (PROTONIX) IV  40 mg Intravenous QHS  . sodium chloride  3 mL Intravenous Q12H   Continuous Infusions:  PRN Meds:.acetaminophen, acetaminophen, diphenhydrAMINE, hydrALAZINE, ondansetron (ZOFRAN) IV, ondansetron, oxyCODONE-acetaminophen  Assessment/Plan:  [name] is a 63 y/o woman with a chief complaint of severe abdominal pain and past medical history significant for hypertension, coronary artery disease, poorly controlled diabetes, chronic kidney disease stage II, history of bowel obstruction status post laparotomy with the lysis of adhesions, small bowel resection in march 2014.  Principal Problem:   Abdominal pain, acute Active Problems:   Uncontrolled type 2 diabetes mellitus with insulin therapy   HYPERTENSION   CKD (chronic kidney disease) stage 3, GFR 30-59 ml/min   Chronic chest pain   Abdominal pain  #Acute Abdominal Pain: Patient has severe diffuse abdominal pain which is more tender in the epigastric/periumbilical region with no fever or constipation. Abdominal X-ray shows no free intraperitoneal air or bowel obstruction making SBO obstruction less likely. CT shows no acute intra-abdominal findings and a periumbilical hernia with non-obstructed small bowel. Ruptured AAA contained by a hematoma less likely per CT. Atypical presentation of MI less likely due to negative trending troponin and stable EKGs. Appendicitis is not possible, patient has had  an appendectomy. LFTs are within a normal range and pain is not located in the RUQ making a infection or disease per liver or gallbladder etiology unlikely. Lipase levels are slightly elevated, but there is no pain which radiates to the back and patient denies alcohol use for many years  and does not have signs of gallstone obstruction via LFTs making acute pancreatitis less likely. Chronic pancreatitis unlikely due to no calcifications seen on abdominal x-ray, CT scan, and from lack of alcohol use. UTI unlikely due to clinical exam and ROS, will obtain an UA. Patient reports a history of acute pancreatitis in her past when she used alcohol.  - CT with contrast shows periumbilical hernia with no small bowel obstruction - Negative troponin levels x3  - CBC w/diff daily  - CMP daily  - UA show protein and hyaline casts  - Start morphine PRN Q4h for pain  - Start Telemetry for 24 hours to monitor for abnormal tracings  - 12-lead EKG reading  - Zofran PRN for nausea/vomiting Q4hrs  - Consult general surgery, recommends outpatient follow up for periumbilical hernia repair     #Headache: Diffuse tension headache located bilaterally in the frontal and occipital areas. Resolved  - D/C PO percocet to dilaudid due to itching  - Consider tylenol when able to tolerate PO   #Itching: Began to have itching overnight, most likely due to morphine. She was switched to IV dilaudid per surgery for NPO.  - D/C morphine   - Continue percocet PO Q4h PRN  - D/C Dilaudid  - Continue Loratadine PRN  - Benadryl did not help with itching   #Hypertension: Patient has uncontrolled hypertension and has had elevated BP readings since admission.  - Continue clonidine patch  - Hydralazine PRN for SBP > 170 or DBP >110. Hold if HR > 95.   #Diabetes - uncontrolled: Patient has had blood sugars ranging from 250 to 59 last week. Previous A1c of 10.4.  - SSI, sensitive   - CBG monitoring Q4 hours   #Chronic Kidney Disease Stage 3: Serum creatinine at 1.17, which is less than her typical baseline which is typically ~ 1.5  - Avoid nephrotoxic medications  - Trend serum creatinine daily   #DVT PPX:  - Lovenox   #Dispo: Disposition will go home today and followed up with by surgery for a periumbilical  hernia repair and with her PCP.  This is a Psychologist, occupational Note.  The care of the patient was discussed with Dr. Sherrine Maples and the assessment and plan formulated with their assistance.  Please see their attached note for official documentation of the daily encounter.   LOS: 3 days   Camille Bal, Med Student 02/02/2014, 8:45 AM

## 2014-02-02 NOTE — Discharge Instructions (Signed)
Please return go to the emergency department or see your doctor right away if you experience any of the following: - Pain that gets worse or moves to just one location - Inability to keep liquids down - especially if you are making less urine - Fainting - Blood in the vomit or stools, or you are unable to pass gas or have a bowel movement - High fever >101 or shaking chills - Swelling of the abdomen  Hernia A hernia occurs when an internal organ pushes out through a weak spot in the abdominal wall. Hernias most commonly occur in the groin and around the navel. Hernias often can be pushed back into place (reduced). Most hernias tend to get worse over time. Some abdominal hernias can get stuck in the opening (irreducible or incarcerated hernia) and cannot be reduced. An irreducible abdominal hernia which is tightly squeezed into the opening is at risk for impaired blood supply (strangulated hernia). A strangulated hernia is a medical emergency. Because of the risk for an irreducible or strangulated hernia, surgery may be recommended to repair a hernia.  CAUSES   Heavy lifting.  Prolonged coughing.  Straining to have a bowel movement.  A cut (incision) made during an abdominal surgery.  HOME CARE INSTRUCTIONS   Bed rest is not required. You may continue your normal activities.  Avoid lifting more than 10 pounds (4.5 kg) or straining.  Cough gently. If you are a smoker it is best to stop. Even the best hernia repair can break down with the continual strain of coughing. Even if you do not have your hernia repaired, a cough will continue to aggravate the problem.  Do not wear anything tight over your hernia. Do not try to keep it in with an outside bandage or truss. These can damage abdominal contents if they are trapped within the hernia sac.  Eat a normal diet.  Avoid constipation. Straining over long periods of time will increase hernia size and encourage breakdown of repairs. If you  cannot do this with diet alone, stool softeners may be used.  SEEK IMMEDIATE MEDICAL CARE IF:   You have a fever.  You develop increasing abdominal pain.  You feel nauseous or vomit.  Your hernia is stuck outside the abdomen, looks discolored, feels hard, or is tender.  You have any changes in your bowel habits or in the hernia that are unusual for you.  You have increased pain or swelling around the hernia.  You cannot push the hernia back in place by applying gentle pressure while lying down.  MAKE SURE YOU:   Understand these instructions.  Will watch your condition.  Will get help right away if you are not doing well or get worse. Document Released: 10/11/2005 Document Revised: 01/03/2012 Document Reviewed: 05/30/2008 [facility] Patient Information 2014 [place].

## 2014-02-04 ENCOUNTER — Telehealth: Payer: Self-pay | Admitting: *Deleted

## 2014-02-04 NOTE — Telephone Encounter (Signed)
Reviewed discharge instructions and for her insulin they are for what patient says she is doing . She has an appointment Thursday with Dr.kennerly. Left message for Fulton Molelice to get back to us with actual blood sugar numbers so we can determine better if she xcan wait until than for insulin adjustment or if it should be made sooner.

## 2014-02-04 NOTE — Telephone Encounter (Signed)
Pt called stating she was d/c from the  hospital on Saturday,  She was instructed to take 2 units of Novolog is BS over 150, TID and Lantus 5 units at PM. She states her BS has been over 150 since she got home.  She is trying to increase PO intake and wanted a review of instructions. I will ask Norm Parcelonna Plyler, Diabetic educator  to call pt.  Pt # [phone]

## 2014-02-05 ENCOUNTER — Encounter (INDEPENDENT_AMBULATORY_CARE_PROVIDER_SITE_OTHER): Payer: Self-pay | Admitting: General Surgery

## 2014-02-05 ENCOUNTER — Ambulatory Visit (INDEPENDENT_AMBULATORY_CARE_PROVIDER_SITE_OTHER): Payer: Medicare Other | Admitting: General Surgery

## 2014-02-05 VITALS — BP 150/90 | HR 89 | Temp 97.6°F | Resp 16 | Ht 65.0 in | Wt 201.0 lb

## 2014-02-05 DIAGNOSIS — K432 Incisional hernia without obstruction or gangrene: Secondary | ICD-10-CM

## 2014-02-05 NOTE — Progress Notes (Signed)
Patient ID: [name], female   DOB: Nov 08, 1950, 63 y.o.   MRN: [id number]  Chief Complaint  Patient presents with  . Follow-up    HPI [name] is a 63 y.o. female.  The patient is a 63 year old female who is processing the hospital manner for abdominal pain. Patient underwent CT scan revealed incisional hernia in the midline, near the umbilicus. The patient was not obstructed and/or incarcerated/strangulated. Patient was just discharged. Patient presents today for surgical evaluation.  HPI  Past Medical History  Diagnosis Date  . Hypertension   . Peripheral neuropathy   . Cervicalgia   . Low back pain syndrome   . Hyperlipidemia   . Vitamin D deficiency   . Vitamin B12 deficiency   . Iliotibial band syndrome   . Anemia   . Borderline personality disorder   . Nonorganic psychosis   . GERD (gastroesophageal reflux disease)   . Diabetes mellitus   . Depression   . Diabetic gastroparesis   . Sleep apnea   . CAD (coronary artery disease)     Catherization 11/18/09>nonobstructive CAD , normal LV function, EF 65%  . Health maintenance examination     Colonoscopy 2009> normal; Diabetic eye exam 12/2008. No diabetic retinopathy; Mammogram 11/10> No evidence of malignancy, DXA 03/14/13 : normal  . Headache(784.0)   . Arthritis   . SBO (small bowel obstruction) 01/09/2013  . Diastolic CHF     Grade I, on Echo 06/2013, EF 55-60%    Past Surgical History  Procedure Laterality Date  . Abdominal hysterectomy    . Hand surgery    . Bowel resection N/A 01/10/2013    Procedure: SMALL BOWEL RESECTION;  Surgeon: Lodema PilotBrian Layton, DO;  Location: [facility] OR;  Service: General;  Laterality: N/A;  . Lysis of adhesion N/A 01/10/2013    Procedure: LYSIS OF ADHESION;  Surgeon: Lodema PilotBrian Layton, DO;  Location: [facility] OR;  Service: General;  Laterality: N/A;  . Laparotomy N/A 01/10/2013    Procedure: EXPLORATORY LAPAROTOMY;  Surgeon: Lodema PilotBrian Layton, DO;  Location: [facility] OR;  Service: General;  Laterality: N/A;     Family History  Problem Relation Age of Onset  . Diabetes Mother   . Hypertension Mother   . Hyperlipidemia Mother   . Arthritis Mother   . Depression Mother   . Heart disease Father   . Diabetes Sister   . Diabetes Brother   . Heart disease Brother   . Heart disease Paternal Grandmother   . Seizures Brother     Social History History  Substance Use Topics  . Smoking status: Former Smoker    Types: Cigarettes    Quit date: 10/25/2002  . Smokeless tobacco: Never Used  . Alcohol Use: No    Allergies  Allergen Reactions  . Cimetidine Other (See Comments)    Breast swelling  . Indomethacin Diarrhea    Caused severe diarrhea with episodes of incontinance  . Penicillins Hives and Itching    Other reaction(s): Delusions (intolerance)  . Indomethacin Other (See Comments)    Severe diarrhea  . Sulfonamide Derivatives     unknown    Current Outpatient Prescriptions  Medication Sig Dispense Refill  . ACCU-CHEK FASTCLIX LANCETS MISC       . ACCU-CHEK SMARTVIEW test strip       . aspirin 81 MG chewable tablet Chew 81 mg by mouth daily.      Marland Kitchen. buPROPion (WELLBUTRIN XL) 150 MG 24 hr tablet Take 3 tablets (450 mg total) by  mouth daily.  90 tablet  5  . cloNIDine (CATAPRES - DOSED IN MG/24 HR) 0.2 mg/24hr patch Place 1 patch (0.2 mg total) onto the skin every 7 (seven) days.  4 patch  0  . dexlansoprazole (DEXILANT) 60 MG capsule Take 60 mg by mouth daily.        Tery Sanfilippo. Docusate Sodium (COLACE PO) Take 2 tablets by mouth daily as needed (constipation).      Marland Kitchen. FLUoxetine (PROZAC) 20 MG capsule Take 1 capsule (20 mg total) by mouth every morning.  30 capsule  5  . furosemide (LASIX) 40 MG tablet Take 1 tablet (40 mg total) by mouth daily.  30 tablet  11  . gabapentin (NEURONTIN) 800 MG tablet Take 800-1,600 mg by mouth 2 (two) times daily. 800 mg in the am and 1600 mg in the evening.      Marland Kitchen. HYDROcodone-acetaminophen (NORCO/VICODIN) 5-325 MG per tablet Take 1 tablet by mouth 3 (three)  times daily as needed for moderate pain.  15 tablet  0  . insulin aspart (NOVOLOG) 100 UNIT/ML injection Inject 10 Units into the skin.      Marland Kitchen. isosorbide mononitrate (IMDUR) 60 MG 24 hr tablet Take 1 tablet (60 mg total) by mouth daily.  30 tablet  6  . LANTUS SOLOSTAR 100 UNIT/ML Solostar Pen       . losartan (COZAAR) 100 MG tablet Take 0.5 tablets (50 mg total) by mouth daily.  30 tablet  11  . metoprolol succinate (TOPROL-XL) 25 MG 24 hr tablet Take 1 tablet (25 mg total) by mouth daily.  30 tablet  11  . ondansetron (ZOFRAN) 4 MG tablet Take 1 tablet (4 mg total) by mouth every 6 (six) hours as needed for nausea.  20 tablet  0  . senna (SENOKOT) 8.6 MG TABS Take 4 tablets by mouth daily as needed (constipation).      . temazepam (RESTORIL) 15 MG capsule take 1 capsule by mouth at bedtime if needed for sleep  30 capsule  0   No current facility-administered medications for this visit.    Review of Systems Review of Systems  Constitutional: Negative.   HENT: Negative.   Respiratory: Negative.   Cardiovascular: Negative.   Gastrointestinal: Negative.   Neurological: Negative.   All other systems reviewed and are negative.   Blood pressure 150/90, pulse 89, temperature 97.6 F (36.4 C), resp. rate 16, height 5\' 5"  (1.651 m), weight 201 lb (91.173 kg).  Physical Exam Physical Exam  Constitutional: She is oriented to person, place, and time. She appears well-developed and well-nourished.  HENT:  Head: Normocephalic and atraumatic.  Eyes: Conjunctivae and EOM are normal. Pupils are equal, round, and reactive to light.  Neck: Normal range of motion. Neck supple.  Cardiovascular: Normal rate, regular rhythm and normal heart sounds.   Pulmonary/Chest: Effort normal and breath sounds normal.  Abdominal: Soft. Bowel sounds are normal. A hernia is present. Hernia confirmed positive in the ventral area.    Musculoskeletal: Normal range of motion.  Neurological: She is alert and oriented  to person, place, and time.  Skin: Skin is warm and dry.  Psychiatric: She has a normal mood and affect.    Data Reviewed CT scan revealed midline incisional hernia from previous operation.  Assessment    63 year old female with an incisional hernia.     Plan    1. We'll proceed to operative for laparoscopic incisional hernia repair with Mesh. 2. Discussed with the patient the possibility of  having convert to an open procedure should the scar tissue and/or anatomy be difficult. 3. All risks and benefits were discussed with the patient, to generally include infection, bleeding, damage to surrounding structures, acute and chronic nerve pain, and recurrence. Alternatives were offered and described.  All questions were answered and the patient voiced understanding of the procedure and wishes to proceed at this point.         Axel Fillerrmando Chrisette Man 02/05/2014, 2:54 PM

## 2014-02-07 ENCOUNTER — Ambulatory Visit (INDEPENDENT_AMBULATORY_CARE_PROVIDER_SITE_OTHER): Payer: PRIVATE HEALTH INSURANCE | Admitting: Internal Medicine

## 2014-02-07 ENCOUNTER — Encounter: Payer: Self-pay | Admitting: Internal Medicine

## 2014-02-07 VITALS — BP 139/78 | HR 78 | Temp 97.7°F | Ht 65.0 in | Wt 206.1 lb

## 2014-02-07 DIAGNOSIS — E1165 Type 2 diabetes mellitus with hyperglycemia: Secondary | ICD-10-CM

## 2014-02-07 DIAGNOSIS — M479 Spondylosis, unspecified: Secondary | ICD-10-CM

## 2014-02-07 DIAGNOSIS — M1990 Unspecified osteoarthritis, unspecified site: Secondary | ICD-10-CM

## 2014-02-07 DIAGNOSIS — M549 Dorsalgia, unspecified: Secondary | ICD-10-CM

## 2014-02-07 DIAGNOSIS — IMO0002 Reserved for concepts with insufficient information to code with codable children: Secondary | ICD-10-CM

## 2014-02-07 DIAGNOSIS — IMO0001 Reserved for inherently not codable concepts without codable children: Secondary | ICD-10-CM

## 2014-02-07 DIAGNOSIS — E119 Type 2 diabetes mellitus without complications: Secondary | ICD-10-CM

## 2014-02-07 DIAGNOSIS — Z794 Long term (current) use of insulin: Secondary | ICD-10-CM

## 2014-02-07 LAB — GLUCOSE, CAPILLARY: Glucose-Capillary: 205 mg/dL — ABNORMAL HIGH (ref 70–99)

## 2014-02-07 MED ORDER — INSULIN ASPART 100 UNIT/ML ~~LOC~~ SOLN: 6.0000 [IU] | Freq: Three times a day (TID) | SUBCUTANEOUS | Status: DC

## 2014-02-07 MED ORDER — INSULIN GLARGINE 100 UNIT/ML SOLOSTAR PEN: 20.0000 [IU] | PEN_INJECTOR | Freq: Every day | SUBCUTANEOUS | Status: DC

## 2014-02-07 MED ORDER — OXYCODONE-ACETAMINOPHEN 5-325 MG PO TABS: 1.0000 | ORAL_TABLET | Freq: Three times a day (TID) | ORAL | Status: DC | PRN

## 2014-02-07 NOTE — Progress Notes (Signed)
   Subjective:    Patient ID: [name], female    DOB: 1951-04-26, 63 y.o.   MRN: [id number]  HPI Ms. [name] is a 63 yr old woman with PMH of dCHF, CKD stage 3, DJD of the back, CAD, HTN, DM2, who presents for HFU visit. She was hospitalized pn 4/8 for evaluation of abdominal pain and discharged on 4/11. She was found to have incisional ventral hernia and will have lap surgery on 5/8 for this problem. She has regular and soft BMs and denies N/V/D.  Her abdominal pain is not well controlled with Vicodin which has been prescribed to her monthly for her chronic knee and back pain. She requests a stronger pain medication.   Her back pain has been worse since her hospitalization. She states that she has severe muscles cramps that radiate to her abdomen. She has been unable to prepare her own meals or clean her house.   Her insulin regimen was significantly reduced during her recent hospitalization due to concerns of possible hypoglycemia with her decreased intake per mouth. She is slowly increasing her intake and has noticed BS persistently in the 200s with the reduced insulin doses.   Review of Systems  Constitutional: Positive for activity change. Negative for fever, chills, diaphoresis, appetite change, fatigue and unexpected weight change.  Respiratory: Negative for cough, shortness of breath and wheezing.   Cardiovascular: Negative for chest pain, palpitations and leg swelling.  Gastrointestinal: Positive for abdominal pain. Negative for nausea, vomiting, diarrhea, constipation, blood in stool and rectal pain.  Genitourinary: Negative for dysuria and difficulty urinating.  Musculoskeletal: Positive for back pain.  Neurological: Negative for dizziness, weakness and light-headedness.  Psychiatric/Behavioral: Negative for agitation.       Objective:   Physical Exam  Nursing note and vitals reviewed. Constitutional: She is oriented to person, place, and time. No distress.  Obese, poor eye  contact  Cardiovascular: Normal rate and regular rhythm.   Pulmonary/Chest: Effort normal and breath sounds normal. No respiratory distress. She has no wheezes. She has no rales.  Abdominal: Soft. Bowel sounds are normal. She exhibits no distension and no mass. There is tenderness. There is no rebound and no guarding.  TTP at the LLQ and epigastric area extending down to her umbilicus   Musculoskeletal: She exhibits tenderness. She exhibits no edema.  Paraspinal tenderness of the lumbar spine with muscle tightness.    Neurological: She is alert and oriented to person, place, and time.  Skin: Skin is warm and dry. She is not diaphoretic.  Psychiatric: She has a normal mood and affect.          Assessment & Plan:

## 2014-02-07 NOTE — Patient Instructions (Signed)
-  Start using Lantus 20 units a bedtime.  -Start using Novolog 6 units with meals for blood sugars over 150.  -Keep checking your blood sugars 3 times per day before meals. -Take Percocet as needed for back pain. Do not take Hydrocodon-acetominaphen with Percocet.  -Apply heating pad to your lower back 4 times per day for 20 minutes at a time to relax your lower back muscles.  -Follow up with Dr. Everardo BealsSharda in 2 weeks.   Thank you for bringing your medicines today. This helps us keep you safe from mistakes.

## 2014-02-08 NOTE — Assessment & Plan Note (Signed)
Lab Results  Component Value Date   HGBA1C 10.4 01/02/2014   HGBA1C 13.4 09/12/2013   HGBA1C 7.2 05/23/2013     Assessment: Diabetes control:  No controlled Progress toward A1C goal:   Not at goal Comments: She was on Lantus 67 units qHS and Novolog 17 units TID ac prior to her hospitalization. She has bee on Lantus 5 units qHS and Novolog 2 units TID ac after her discharge with BS persistently over 200s.   Plan: Medications:  Will increase Lantus to 20 units qHS and Novolog to 6 units TIC ac. Pt to continue checking her BS TID before meals.  Home glucose monitoring: Frequency:   Timing:   Instruction/counseling given: reminded to bring blood glucose meter & log to each visit Educational resources provided: brochure;handout Self management tools provided: copy of home glucose meter download Other plans: Patient to follow up in 2 weeks.

## 2014-02-08 NOTE — Assessment & Plan Note (Addendum)
Pt to apply heat pad to back 4 times per day for 20 minutes Pt to avoid back exercises given current ventral hernia Rx of Percocet 5-325mg  1-2 tablet q8hr PRN for back/abdominal pain #45 (pt has signed medication contract for Vicodin but needs something stronger for abd pain as well. She will discuss the need for Percocet refill during her next visit with her PCP in 2 weeks). Pt advised not to take both Percocet and Vicodin--she verbalized understanding.

## 2014-02-09 NOTE — Progress Notes (Signed)
Case discussed with Dr. Kennerly soon after the resident saw the patient.  We reviewed the resident's history and exam and pertinent patient test results.  I agree with the assessment, diagnosis, and plan of care documented in the resident's note. 

## 2014-02-12 ENCOUNTER — Other Ambulatory Visit: Payer: Self-pay | Admitting: Internal Medicine

## 2014-02-12 DIAGNOSIS — Z1231 Encounter for screening mammogram for malignant neoplasm of breast: Secondary | ICD-10-CM

## 2014-02-14 ENCOUNTER — Telehealth: Payer: Self-pay | Admitting: *Deleted

## 2014-02-14 DIAGNOSIS — I10 Essential (primary) hypertension: Secondary | ICD-10-CM

## 2014-02-14 MED ORDER — CLONIDINE HCL 0.2 MG/24HR TD PTWK: 0.2000 mg | MEDICATED_PATCH | TRANSDERMAL | Status: DC

## 2014-02-14 NOTE — Telephone Encounter (Signed)
Pt has requested a refill on her clonidine patches.  She has an upcoming appt in a couple of weeks. Feels the patches helps "a little" and will discuss it at follow up visit.Darlene C Goldston4/23/20158:46 AM

## 2014-02-15 ENCOUNTER — Encounter (HOSPITAL_COMMUNITY): Payer: Self-pay | Admitting: Pharmacy Technician

## 2014-02-22 ENCOUNTER — Ambulatory Visit (INDEPENDENT_AMBULATORY_CARE_PROVIDER_SITE_OTHER): Payer: PRIVATE HEALTH INSURANCE | Admitting: Dietician

## 2014-02-22 ENCOUNTER — Encounter: Payer: Self-pay | Admitting: Internal Medicine

## 2014-02-22 ENCOUNTER — Ambulatory Visit (INDEPENDENT_AMBULATORY_CARE_PROVIDER_SITE_OTHER): Payer: PRIVATE HEALTH INSURANCE | Admitting: Internal Medicine

## 2014-02-22 VITALS — BP 124/67 | HR 78 | Temp 97.3°F | Wt 200.5 lb

## 2014-02-22 DIAGNOSIS — E1165 Type 2 diabetes mellitus with hyperglycemia: Secondary | ICD-10-CM

## 2014-02-22 DIAGNOSIS — M546 Pain in thoracic spine: Secondary | ICD-10-CM

## 2014-02-22 DIAGNOSIS — D509 Iron deficiency anemia, unspecified: Secondary | ICD-10-CM

## 2014-02-22 DIAGNOSIS — IMO0002 Reserved for concepts with insufficient information to code with codable children: Secondary | ICD-10-CM

## 2014-02-22 DIAGNOSIS — Z794 Long term (current) use of insulin: Secondary | ICD-10-CM

## 2014-02-22 DIAGNOSIS — M549 Dorsalgia, unspecified: Secondary | ICD-10-CM

## 2014-02-22 DIAGNOSIS — M479 Spondylosis, unspecified: Secondary | ICD-10-CM

## 2014-02-22 DIAGNOSIS — M1990 Unspecified osteoarthritis, unspecified site: Secondary | ICD-10-CM

## 2014-02-22 DIAGNOSIS — E119 Type 2 diabetes mellitus without complications: Secondary | ICD-10-CM

## 2014-02-22 DIAGNOSIS — E559 Vitamin D deficiency, unspecified: Secondary | ICD-10-CM

## 2014-02-22 DIAGNOSIS — IMO0001 Reserved for inherently not codable concepts without codable children: Secondary | ICD-10-CM

## 2014-02-22 DIAGNOSIS — I10 Essential (primary) hypertension: Secondary | ICD-10-CM

## 2014-02-22 MED ORDER — OXYCODONE-ACETAMINOPHEN 5-325 MG PO TABS: 2.0000 | ORAL_TABLET | Freq: Three times a day (TID) | ORAL | Status: DC | PRN

## 2014-02-22 MED ORDER — INSULIN ASPART 100 UNIT/ML ~~LOC~~ SOLN: 8.0000 [IU] | Freq: Three times a day (TID) | SUBCUTANEOUS | Status: DC

## 2014-02-22 MED ORDER — INSULIN GLARGINE 100 UNIT/ML SOLOSTAR PEN: 25.0000 [IU] | PEN_INJECTOR | Freq: Every day | SUBCUTANEOUS | Status: DC

## 2014-02-22 NOTE — Progress Notes (Signed)
Medical Nutrition Therapy:  Appt start time: 1500 end time:  1530.  Assessment:  Primary concerns today: Blood sugar control.  Patient's main concern is having her blood sugars under control so her surgery next week will not be cancelled. She has historically had  fluctuating blood sugars form high to low due to inconsistent eating and lack of knowledge of how to cover cars with insulin. Discussed correction insulin and carb counting that may help to prevent. Diabetes distress on the 2 item screen is 12- 6 on both responsies which is the highest stressed response on both questions. She is seeing a counseler and agrees to call next week if she desires assistance with her blood sugars Usual eating pattern includes 1-3 meals and 1-3 snacks per day. Usual physical activity [facility]   Progress Towards Goal(s):  Some progress.   Nutritional Diagnosis:  [place]-2.3 Food-medication interaction As related to lack of coordiantion between food intake and insulin dosages.  As evidenced by her fluctuating blood sugars and frustration with being restricted on what and when she eats to have to control her blood sugars.    Intervention:  Nutrition education about [employer] expert meter and carb counting. Patient not ready sue to upcoming surgery and multiple stresses in her life. .  Monitoring/Evaluation:  Dietary intake, exercise, blood sugars, and body weight in 4 week(s).

## 2014-02-22 NOTE — Patient Instructions (Signed)
-   Increase lantus to 25u before bed, and increase novolog to 8units three times daily before meals.    - I am writing for percocet 10-650mg  every 8 hours as needed to get you to surgery, and then pain regimen will need to be reassessed.  We also need to get you back into pain clinic after surgery.  Please make an appointment with the pain management doctor.   Please be sure to bring all of your medications with you to every visit.  Should you have any new or worsening symptoms, please be sure to call the clinic at [phone].

## 2014-02-22 NOTE — Assessment & Plan Note (Signed)
BP Readings from Last 3 Encounters:  02/22/14 124/67  02/07/14 139/78  02/05/14 150/90    Lab Results  Component Value Date   NA 141 02/01/2014   K 4.0 02/01/2014   CREATININE 1.20* 02/01/2014    Assessment: Blood pressure control:  controlled Progress toward BP goal:   at goal Plan: Medications:  continue current medications - losartan, clonodine patch (hot flashes), metop 25, lasix 40, imdur 60

## 2014-02-22 NOTE — Patient Instructions (Addendum)
[name]  02/22/2014   Your procedure is scheduled on:  03/01/2014   246pm-412pm  [facility]     1230[facility].  Call this number if you have problems the morning of surgery: [phone]   Remember:   Do not eat food after midnite.              May have clear liquids until 0815am then npo.    Take these medicines the morning of surgery with A SIP OF WATER:              Take 1/2 of evening dose of Insulin nite before surgery.     Do not wear jewelry, make-up or nail polish.  Do not wear lotions, powders, or perfumes.   Do not shave 48 hours prior to surgery.   Do not bring valuables to the hospital.  Contacts, dentures or bridgework may not be worn into surgery.       Patients discharged the day of surgery will not be allowed to drive  home.  Name and phone number of your driver:      [facility] Before surgery, you can play an important role.  Because skin is not sterile, your skin needs to be as free of germs as possible.  You can reduce the number of germs on your skin by washing with CHG (chlorahexidine gluconate) soap before surgery.  CHG is an antiseptic cleaner which kills germs and bonds with the skin to continue killing germs even after washing. Please DO NOT use if you have an allergy to CHG or antibacterial soaps.  If your skin becomes reddened/irritated stop using the CHG and inform your nurse when you arrive at Short Stay. Do not shave (including legs and underarms) for at least 48 hours prior to the first CHG shower.  You may shave your face. Please follow these instructions carefully:  1.  Shower with CHG Soap the night before surgery and the  morning of Surgery.  2.  If you choose to wash your hair, wash your hair first as usual with your  normal  shampoo.  3.  After you shampoo, rinse your hair and body thoroughly to remove the  shampoo.                           4.  Use CHG as you would any other liquid soap.  You can  apply chg directly  to the skin and wash                       Gently with a scrungie or clean washcloth.  5.  Apply the CHG Soap to your body ONLY FROM THE NECK DOWN.   Do not use on open                           Wound or open sores. Avoid contact with eyes, ears mouth and genitals (private parts).                        Genitals (private parts) with your normal soap.             6.  Wash thoroughly, paying special attention to the area where your surgery  will be performed.  7.  Thoroughly rinse your body with warm water from the neck  down.  8.  DO NOT shower/wash with your normal soap after using and rinsing off  the CHG Soap.                9.  Pat yourself dry with a clean towel.            10.  Wear clean pajamas.            11.  Place clean sheets on your bed the night of your first shower and do not  sleep with pets. Day of Surgery : Do not apply any lotions/deodorants the morning of surgery.  Please wear clean clothes to the hospital/surgery center.  FAILURE TO FOLLOW THESE INSTRUCTIONS MAY RESULT IN THE CANCELLATION OF YOUR SURGERY PATIENT SIGNATURE_________________________________  NURSE SIGNATURE__________________________________  ________________________________________________________________________    CLEAR LIQUID DIET   Foods Allowed                                                                     Foods Excluded  Coffee and tea, regular and decaf                             liquids that you cannot  Plain Jell-O in any flavor                                             see through such as: Fruit ices (not with fruit pulp)                                     milk, soups, orange juice  Iced Popsicles                                    All solid food Carbonated beverages, regular and diet                                    Cranberry, grape and apple juices Sports drinks like Gatorade Lightly seasoned clear broth or consume(fat free) Sugar, honey syrup  Sample  Menu Breakfast                                Lunch                                     Supper Cranberry juice                    Beef broth                            Chicken broth Jell-O  Grape juice                           Apple juice Coffee or tea                        Jell-O                                      Popsicle                                                Coffee or tea                        Coffee or tea  _____________________________________________________________________

## 2014-02-22 NOTE — Assessment & Plan Note (Signed)
Provided script for percocet 5-325, 2 tabs q8h prn, #60, with plans for reassessment after bowel surgery and then will make appt with pain clinic MD.  We will need to transition her pain mgmt to pain clinic (she was supposed to undergo injection by them which was cancelled d/t elevated CBGs, has not gone back since).

## 2014-02-22 NOTE — Progress Notes (Signed)
Subjective:   Patient ID: [name] female   DOB: 03-04-1951 63 y.o.   MRN: [id number]  HPI: [name] is a [age] y.o. woman with DM, CAD, GERD & HTN who presents for DM follow up.  She was recently seen in clinic on 02/07/14 for HFU for abdominal pain and found to have a ventral hernia (scheduled for lap surgery on 5/8).  Also at that visit, percocet was added to vicodin for pain mgmt of worsening back pain. Her insulin regimen has been altered in the setting of acute illness as well.   Taking two 5-325 percocet daily and Not taking hydrocodone.   Regarding low back pain, has been having mid back that's new, can't differentiate if it is muscle.  Seems to radiate to the abdomen, feels like a band of pain, that's been occurring for a few months.   Taking lantus 20u qHS and novolog 6u TIDWC, usually >150 but has had some lows to 50-60s in trying not to eat. CBGs usually 200s.   Regular BMs with senna, not sleeping well, sleep medicine not working, needs another appt with Dr. Donell BeersPlovsky. Her therapist left.    Past Medical History  Diagnosis Date  . Hypertension   . Peripheral neuropathy   . Cervicalgia   . Low back pain syndrome   . Hyperlipidemia   . Vitamin D deficiency   . Vitamin B12 deficiency   . Iliotibial band syndrome   . Anemia   . Borderline personality disorder   . Nonorganic psychosis   . GERD (gastroesophageal reflux disease)   . Diabetes mellitus   . Depression   . Diabetic gastroparesis   . Sleep apnea   . CAD (coronary artery disease)     Catherization 11/18/09>nonobstructive CAD , normal LV function, EF 65%  . Health maintenance examination     Colonoscopy 2009> normal; Diabetic eye exam 12/2008. No diabetic retinopathy; Mammogram 11/10> No evidence of malignancy, DXA 03/14/13 : normal  . Headache(784.0)   . Arthritis   . SBO (small bowel obstruction) 01/09/2013  . Diastolic CHF     Grade I, on Echo 06/2013, EF 55-60%   Current Outpatient Prescriptions    Medication Sig Dispense Refill  . aspirin 81 MG chewable tablet Chew 81 mg by mouth daily.      Marland Kitchen. buPROPion (WELLBUTRIN XL) 150 MG 24 hr tablet Take 450 mg by mouth every morning.      . cloNIDine (CATAPRES - DOSED IN MG/24 HR) 0.2 mg/24hr patch Place 1 patch (0.2 mg total) onto the skin every 7 (seven) days.  4 patch  1  . dexlansoprazole (DEXILANT) 60 MG capsule Take 60 mg by mouth daily.        Tery Sanfilippo. Docusate Sodium (COLACE PO) Take 2 tablets by mouth daily as needed (constipation).      Marland Kitchen. FLUoxetine (PROZAC) 20 MG capsule Take 1 capsule (20 mg total) by mouth every morning.  30 capsule  5  . furosemide (LASIX) 40 MG tablet Take 40 mg by mouth every morning.      . gabapentin (NEURONTIN) 800 MG tablet Take 800-1,600 mg by mouth 2 (two) times daily. 800 mg in the am and 1600 mg in the evening.      . insulin aspart (NOVOLOG) 100 UNIT/ML injection Inject 6 Units into the skin 3 (three) times daily with meals.  10 mL  0  . Insulin Glargine (LANTUS SOLOSTAR) 100 UNIT/ML Solostar Pen Inject 20 Units into the skin daily at 10  pm.  15 mL  0  . isosorbide mononitrate (IMDUR) 60 MG 24 hr tablet Take 60 mg by mouth every morning.      Marland Kitchen. losartan (COZAAR) 100 MG tablet Take 50 mg by mouth every morning.      . metoprolol succinate (TOPROL-XL) 25 MG 24 hr tablet Take 25 mg by mouth every morning.      . ondansetron (ZOFRAN) 4 MG tablet Take 1 tablet (4 mg total) by mouth every 6 (six) hours as needed for nausea.  20 tablet  0  . oxyCODONE-acetaminophen (PERCOCET/ROXICET) 5-325 MG per tablet Take 1-2 tablets by mouth every 8 (eight) hours as needed for moderate pain or severe pain.  45 tablet  0  . senna (SENOKOT) 8.6 MG TABS Take 4 tablets by mouth daily as needed (constipation).      . temazepam (RESTORIL) 15 MG capsule take 1 capsule by mouth at bedtime if needed for sleep  30 capsule  0   No current facility-administered medications for this visit.   Family History  Problem Relation Age of Onset  .  Diabetes Mother   . Hypertension Mother   . Hyperlipidemia Mother   . Arthritis Mother   . Depression Mother   . Heart disease Father   . Diabetes Sister   . Diabetes Brother   . Heart disease Brother   . Heart disease Paternal Grandmother   . Seizures Brother    History   Social History  . Marital Status: Married    Spouse Name: N/A    Number of Children: N/A  . Years of Education: N/A   Social History Main Topics  . Smoking status: Former Smoker    Types: Cigarettes    Quit date: 10/25/2002  . Smokeless tobacco: Never Used  . Alcohol Use: No  . Drug Use: No  . Sexual Activity: Yes   Other Topics Concern  . Not on file   Social History Narrative  . No narrative on file    Objective:  Physical Exam: Filed Vitals:   02/22/14 1407  BP: 124/67  Pulse: 78  Temp: 97.3 F (36.3 C)  TempSrc: Oral  Weight: 200 lb 8 oz (90.946 kg)  SpO2: 100%   General: pleasant, no acute distress HEENT: PERRL, EOMI, no scleral icterus Cardiac: RRR, no rubs, murmurs or gallops Pulm: clear to auscultation bilaterally, moving normal volumes of air Abd: soft, nontender, nondistended, BS normoactive Ext: warm and well perfused, no pedal edema Neuro: alert and oriented X3, cranial nerves II-XII grossly intact  Assessment & Plan:  Case and care discussed with Dr. Dalphine HandingBhardwaj.  Please see problem oriented charting for further details. Patient to return in 2 weeks for follow up.

## 2014-02-22 NOTE — Assessment & Plan Note (Signed)
Increase lantus 20 --> 25u qHS, and novolog 8u TIDWC, met with Butch Penny, considering smart meter.  Return in 2 weeks

## 2014-02-22 NOTE — Assessment & Plan Note (Signed)
Encouraged PO Fe, but monitor for regular BMs

## 2014-02-25 ENCOUNTER — Encounter (HOSPITAL_COMMUNITY)
Admission: RE | Admit: 2014-02-25 | Discharge: 2014-02-25 | Disposition: A | Discharge status: Home / Self Care | Payer: PRIVATE HEALTH INSURANCE | Source: Ambulatory Visit | Attending: General Surgery | Admitting: General Surgery

## 2014-02-25 ENCOUNTER — Encounter (HOSPITAL_COMMUNITY): Payer: Self-pay

## 2014-02-25 DIAGNOSIS — Z01812 Encounter for preprocedural laboratory examination: Secondary | ICD-10-CM | POA: Insufficient documentation

## 2014-02-25 LAB — BASIC METABOLIC PANEL
BUN: 28 mg/dL — ABNORMAL HIGH (ref 6–23)
CALCIUM: 9.9 mg/dL (ref 8.4–10.5)
CHLORIDE: 102 meq/L (ref 96–112)
CO2: 27 meq/L (ref 19–32)
Creatinine, Ser: 1.81 mg/dL — ABNORMAL HIGH (ref 0.50–1.10)
GFR calc Af Amer: 33 mL/min — ABNORMAL LOW (ref >90)
GFR calc non Af Amer: 29 mL/min — ABNORMAL LOW (ref >90)
GLUCOSE: 171 mg/dL — AB (ref 70–99)
Potassium: 5.1 mEq/L (ref 3.7–5.3)
Sodium: 142 mEq/L (ref 137–147)

## 2014-02-25 LAB — CBC
HCT: 34.1 % — ABNORMAL LOW (ref 36.0–46.0)
HEMOGLOBIN: 10.7 g/dL — AB (ref 12.0–15.0)
MCH: 23.8 pg — AB (ref 26.0–34.0)
MCHC: 31.4 g/dL (ref 30.0–36.0)
MCV: 75.8 fL — ABNORMAL LOW (ref 78.0–100.0)
PLATELETS: 263 10*3/uL (ref 150–400)
RBC: 4.5 MIL/uL (ref 3.87–5.11)
RDW: 16.7 % — ABNORMAL HIGH (ref 11.5–15.5)
WBC: 7 10*3/uL (ref 4.0–10.5)

## 2014-02-25 NOTE — Progress Notes (Addendum)
ECHO 07/06/13- [facility]   Cath - 2011  LOV with PCP on 02/22/14- [facility]  CXR 01/30/14- [facility]  EKG 01/30/14 [facility]

## 2014-02-25 NOTE — Progress Notes (Signed)
Cbc and BMP results faxed via  [facility] to Dr Derrell Lollingamirez.

## 2014-02-25 NOTE — Progress Notes (Signed)
Case discussed with Dr. Sharda at the time of the visit.  We reviewed the resident's history and exam and pertinent patient test results.  I agree with the assessment, diagnosis, and plan of care documented in the resident's note.    

## 2014-02-25 NOTE — Progress Notes (Signed)
Patient was instructed at time of preop appointment to eat a good healthy snack prior to bedtime nite before surgery. Patient also instructed on clear liquids until 0815am morning of surgery.  Patient made aware on day of surgery to check glucose and to keep check on glucose and if glucose starts to decrease to come on in to Short Stay earlier than arrival time so IV can be started.

## 2014-02-26 NOTE — Assessment & Plan Note (Signed)
Will need to readdress Vit D supplementation at next visit- patient to have laparoscopic surgery this week, and given that this is not an urgent matter, can be post poned until next follow up.

## 2014-02-27 ENCOUNTER — Other Ambulatory Visit (HOSPITAL_COMMUNITY): Payer: Self-pay | Admitting: Psychiatry

## 2014-03-01 ENCOUNTER — Ambulatory Visit (HOSPITAL_COMMUNITY)
Admission: RE | Admit: 2014-03-01 | Discharge: 2014-03-01 | Disposition: A | Discharge status: Home / Self Care | Payer: PRIVATE HEALTH INSURANCE | Source: Ambulatory Visit | Attending: General Surgery | Admitting: General Surgery

## 2014-03-01 ENCOUNTER — Ambulatory Visit (HOSPITAL_COMMUNITY): Payer: PRIVATE HEALTH INSURANCE | Admitting: Anesthesiology

## 2014-03-01 ENCOUNTER — Encounter (HOSPITAL_COMMUNITY): Payer: PRIVATE HEALTH INSURANCE | Admitting: Anesthesiology

## 2014-03-01 ENCOUNTER — Encounter (HOSPITAL_COMMUNITY): Payer: Self-pay | Admitting: *Deleted

## 2014-03-01 ENCOUNTER — Encounter (HOSPITAL_COMMUNITY)
Admission: RE | Disposition: A | Discharge status: Home / Self Care | Payer: Self-pay | Source: Ambulatory Visit | Attending: General Surgery

## 2014-03-01 DIAGNOSIS — K430 Incisional hernia with obstruction, without gangrene: Secondary | ICD-10-CM | POA: Insufficient documentation

## 2014-03-01 DIAGNOSIS — E785 Hyperlipidemia, unspecified: Secondary | ICD-10-CM | POA: Insufficient documentation

## 2014-03-01 DIAGNOSIS — K660 Peritoneal adhesions (postprocedural) (postinfection): Secondary | ICD-10-CM | POA: Insufficient documentation

## 2014-03-01 DIAGNOSIS — Z87891 Personal history of nicotine dependence: Secondary | ICD-10-CM | POA: Insufficient documentation

## 2014-03-01 DIAGNOSIS — Z882 Allergy status to sulfonamides status: Secondary | ICD-10-CM | POA: Insufficient documentation

## 2014-03-01 DIAGNOSIS — I10 Essential (primary) hypertension: Secondary | ICD-10-CM | POA: Insufficient documentation

## 2014-03-01 DIAGNOSIS — E1149 Type 2 diabetes mellitus with other diabetic neurological complication: Secondary | ICD-10-CM | POA: Insufficient documentation

## 2014-03-01 DIAGNOSIS — K219 Gastro-esophageal reflux disease without esophagitis: Secondary | ICD-10-CM | POA: Insufficient documentation

## 2014-03-01 DIAGNOSIS — I509 Heart failure, unspecified: Secondary | ICD-10-CM | POA: Insufficient documentation

## 2014-03-01 DIAGNOSIS — E559 Vitamin D deficiency, unspecified: Secondary | ICD-10-CM | POA: Insufficient documentation

## 2014-03-01 DIAGNOSIS — K3184 Gastroparesis: Secondary | ICD-10-CM | POA: Insufficient documentation

## 2014-03-01 DIAGNOSIS — G609 Hereditary and idiopathic neuropathy, unspecified: Secondary | ICD-10-CM | POA: Insufficient documentation

## 2014-03-01 DIAGNOSIS — Z79899 Other long term (current) drug therapy: Secondary | ICD-10-CM | POA: Insufficient documentation

## 2014-03-01 DIAGNOSIS — Z7982 Long term (current) use of aspirin: Secondary | ICD-10-CM | POA: Insufficient documentation

## 2014-03-01 DIAGNOSIS — E538 Deficiency of other specified B group vitamins: Secondary | ICD-10-CM | POA: Insufficient documentation

## 2014-03-01 DIAGNOSIS — I5030 Unspecified diastolic (congestive) heart failure: Secondary | ICD-10-CM | POA: Insufficient documentation

## 2014-03-01 DIAGNOSIS — Z794 Long term (current) use of insulin: Secondary | ICD-10-CM | POA: Insufficient documentation

## 2014-03-01 DIAGNOSIS — G4730 Sleep apnea, unspecified: Secondary | ICD-10-CM | POA: Insufficient documentation

## 2014-03-01 DIAGNOSIS — I2510 Atherosclerotic heart disease of native coronary artery without angina pectoris: Secondary | ICD-10-CM | POA: Insufficient documentation

## 2014-03-01 DIAGNOSIS — Z888 Allergy status to other drugs, medicaments and biological substances status: Secondary | ICD-10-CM | POA: Insufficient documentation

## 2014-03-01 DIAGNOSIS — Z880 Allergy status to penicillin: Secondary | ICD-10-CM | POA: Insufficient documentation

## 2014-03-01 HISTORY — PX: HERNIA REPAIR: SHX51

## 2014-03-01 HISTORY — PX: INSERTION OF MESH: SHX5868

## 2014-03-01 HISTORY — PX: INCISIONAL HERNIA REPAIR: SHX193

## 2014-03-01 LAB — GLUCOSE, CAPILLARY
GLUCOSE-CAPILLARY: 181 mg/dL — AB (ref 70–99)
Glucose-Capillary: 187 mg/dL — ABNORMAL HIGH (ref 70–99)

## 2014-03-01 SURGERY — REPAIR, HERNIA, INCISIONAL, LAPAROSCOPIC
Anesthesia: General | Site: Abdomen

## 2014-03-01 MED ORDER — SODIUM CHLORIDE 0.9 % IV SOLN: 250.0000 mL | INTRAVENOUS | Status: DC | PRN

## 2014-03-01 MED ORDER — PROPOFOL 10 MG/ML IV BOLUS
INTRAVENOUS | Status: DC | PRN
  Administered 2014-03-01: 180 mg via INTRAVENOUS

## 2014-03-01 MED ORDER — SODIUM CHLORIDE 0.9 % IJ SOLN
INTRAMUSCULAR | Status: AC
  Filled 2014-03-01: qty 10

## 2014-03-01 MED ORDER — GLYCOPYRROLATE 0.2 MG/ML IJ SOLN
INTRAMUSCULAR | Status: DC | PRN
  Administered 2014-03-01: .6 mg via INTRAVENOUS

## 2014-03-01 MED ORDER — OXYCODONE HCL 5 MG PO TABS
ORAL_TABLET | ORAL | Status: AC
  Filled 2014-03-01: qty 2

## 2014-03-01 MED ORDER — NEOSTIGMINE METHYLSULFATE 10 MG/10ML IV SOLN
INTRAVENOUS | Status: AC
  Filled 2014-03-01: qty 1

## 2014-03-01 MED ORDER — ACETAMINOPHEN 325 MG PO TABS: 650.0000 mg | ORAL_TABLET | ORAL | Status: DC | PRN

## 2014-03-01 MED ORDER — HYDROMORPHONE HCL PF 2 MG/ML IJ SOLN
INTRAMUSCULAR | Status: AC
  Filled 2014-03-01: qty 1

## 2014-03-01 MED ORDER — ONDANSETRON HCL 4 MG/2ML IJ SOLN
INTRAMUSCULAR | Status: DC | PRN
  Administered 2014-03-01: 4 mg via INTRAVENOUS

## 2014-03-01 MED ORDER — SUCCINYLCHOLINE CHLORIDE 20 MG/ML IJ SOLN
INTRAMUSCULAR | Status: DC | PRN
  Administered 2014-03-01: 100 mg via INTRAVENOUS

## 2014-03-01 MED ORDER — NEOSTIGMINE METHYLSULFATE 10 MG/10ML IV SOLN
INTRAVENOUS | Status: DC | PRN
  Administered 2014-03-01: 4 mg via INTRAVENOUS

## 2014-03-01 MED ORDER — 0.9 % SODIUM CHLORIDE (POUR BTL) OPTIME
TOPICAL | Status: DC | PRN
  Administered 2014-03-01: 1000 mL

## 2014-03-01 MED ORDER — HYDROMORPHONE HCL PF 1 MG/ML IJ SOLN
INTRAMUSCULAR | Status: DC | PRN
  Administered 2014-03-01 (×5): .4 mg via INTRAVENOUS

## 2014-03-01 MED ORDER — MIDAZOLAM HCL 2 MG/2ML IJ SOLN
INTRAMUSCULAR | Status: AC
  Filled 2014-03-01: qty 2

## 2014-03-01 MED ORDER — DEXAMETHASONE SODIUM PHOSPHATE 10 MG/ML IJ SOLN
INTRAMUSCULAR | Status: DC | PRN
  Administered 2014-03-01: 6 mg via INTRAVENOUS

## 2014-03-01 MED ORDER — MIDAZOLAM HCL 5 MG/5ML IJ SOLN
INTRAMUSCULAR | Status: DC | PRN
  Administered 2014-03-01: 2 mg via INTRAVENOUS

## 2014-03-01 MED ORDER — EPHEDRINE SULFATE 50 MG/ML IJ SOLN
INTRAMUSCULAR | Status: DC | PRN
  Administered 2014-03-01 (×2): 10 mg via INTRAVENOUS

## 2014-03-01 MED ORDER — LIDOCAINE HCL (CARDIAC) 20 MG/ML IV SOLN
INTRAVENOUS | Status: AC
  Filled 2014-03-01: qty 5

## 2014-03-01 MED ORDER — CHLORHEXIDINE GLUCONATE 4 % EX LIQD: 1.0000 "application " | Freq: Once | CUTANEOUS | Status: DC

## 2014-03-01 MED ORDER — ACETAMINOPHEN 10 MG/ML IV SOLN
1000.0000 mg | Freq: Once | INTRAVENOUS | Status: AC
  Administered 2014-03-01: 1000 mg via INTRAVENOUS
  Filled 2014-03-01: qty 100

## 2014-03-01 MED ORDER — FENTANYL CITRATE 0.05 MG/ML IJ SOLN
INTRAMUSCULAR | Status: AC
  Filled 2014-03-01: qty 2

## 2014-03-01 MED ORDER — FENTANYL CITRATE 0.05 MG/ML IJ SOLN
25.0000 ug | INTRAMUSCULAR | Status: DC | PRN
  Administered 2014-03-01 (×2): 25 ug via INTRAVENOUS

## 2014-03-01 MED ORDER — VANCOMYCIN HCL IN DEXTROSE 1-5 GM/200ML-% IV SOLN
1000.0000 mg | INTRAVENOUS | Status: AC
  Administered 2014-03-01: 1000 mg via INTRAVENOUS

## 2014-03-01 MED ORDER — FENTANYL CITRATE 0.05 MG/ML IJ SOLN
INTRAMUSCULAR | Status: AC
  Filled 2014-03-01: qty 5

## 2014-03-01 MED ORDER — CISATRACURIUM BESYLATE (PF) 10 MG/5ML IV SOLN
INTRAVENOUS | Status: DC | PRN
  Administered 2014-03-01: 3 mg via INTRAVENOUS
  Administered 2014-03-01: 4 mg via INTRAVENOUS
  Administered 2014-03-01: 6 mg via INTRAVENOUS
  Administered 2014-03-01: 2 mg via INTRAVENOUS

## 2014-03-01 MED ORDER — HYDROMORPHONE HCL PF 1 MG/ML IJ SOLN
INTRAMUSCULAR | Status: AC
  Filled 2014-03-01: qty 1

## 2014-03-01 MED ORDER — CISATRACURIUM BESYLATE 20 MG/10ML IV SOLN
INTRAVENOUS | Status: AC
  Filled 2014-03-01: qty 10

## 2014-03-01 MED ORDER — PHENYLEPHRINE 40 MCG/ML (10ML) SYRINGE FOR IV PUSH (FOR BLOOD PRESSURE SUPPORT)
PREFILLED_SYRINGE | INTRAVENOUS | Status: AC
Start: 2014-03-01 — End: 2014-03-01
  Filled 2014-03-01: qty 10

## 2014-03-01 MED ORDER — FENTANYL CITRATE 0.05 MG/ML IJ SOLN
INTRAMUSCULAR | Status: DC | PRN
  Administered 2014-03-01: 150 ug via INTRAVENOUS
  Administered 2014-03-01 (×2): 50 ug via INTRAVENOUS

## 2014-03-01 MED ORDER — SODIUM CHLORIDE 0.9 % IJ SOLN: 3.0000 mL | INTRAMUSCULAR | Status: DC | PRN

## 2014-03-01 MED ORDER — OXYCODONE HCL 5 MG PO TABS
5.0000 mg | ORAL_TABLET | ORAL | Status: DC | PRN
  Administered 2014-03-01: 10 mg via ORAL

## 2014-03-01 MED ORDER — ACETAMINOPHEN 10 MG/ML IV SOLN: 1000.0000 mg | Freq: Once | INTRAVENOUS | Status: DC

## 2014-03-01 MED ORDER — ACETAMINOPHEN 10 MG/ML IV SOLN
1000.0000 mg | Freq: Once | INTRAVENOUS | Status: DC
  Filled 2014-03-01: qty 100

## 2014-03-01 MED ORDER — GLYCOPYRROLATE 0.2 MG/ML IJ SOLN
INTRAMUSCULAR | Status: AC
  Filled 2014-03-01: qty 3

## 2014-03-01 MED ORDER — LIDOCAINE HCL (CARDIAC) 20 MG/ML IV SOLN
INTRAVENOUS | Status: DC | PRN
  Administered 2014-03-01: 100 mg via INTRAVENOUS

## 2014-03-01 MED ORDER — SODIUM CHLORIDE 0.9 % IJ SOLN: 3.0000 mL | Freq: Two times a day (BID) | INTRAMUSCULAR | Status: DC

## 2014-03-01 MED ORDER — PROMETHAZINE HCL 25 MG/ML IJ SOLN: 6.2500 mg | INTRAMUSCULAR | Status: DC | PRN

## 2014-03-01 MED ORDER — DEXAMETHASONE SODIUM PHOSPHATE 10 MG/ML IJ SOLN
INTRAMUSCULAR | Status: AC
  Filled 2014-03-01: qty 1

## 2014-03-01 MED ORDER — BUPIVACAINE-EPINEPHRINE 0.25% -1:200000 IJ SOLN
INTRAMUSCULAR | Status: DC | PRN
  Administered 2014-03-01: 35 mL

## 2014-03-01 MED ORDER — VANCOMYCIN HCL IN DEXTROSE 1-5 GM/200ML-% IV SOLN
INTRAVENOUS | Status: AC
  Filled 2014-03-01: qty 200

## 2014-03-01 MED ORDER — ACETAMINOPHEN 650 MG RE SUPP
650.0000 mg | RECTAL | Status: DC | PRN
  Filled 2014-03-01: qty 1

## 2014-03-01 MED ORDER — OXYCODONE-ACETAMINOPHEN 10-325 MG PO TABS: 1.0000 | ORAL_TABLET | ORAL | Status: DC | PRN

## 2014-03-01 MED ORDER — BUPIVACAINE-EPINEPHRINE 0.25% -1:200000 IJ SOLN
INTRAMUSCULAR | Status: AC
  Filled 2014-03-01: qty 1

## 2014-03-01 MED ORDER — LACTATED RINGERS IV SOLN
INTRAVENOUS | Status: DC
  Administered 2014-03-01: 1000 mL via INTRAVENOUS
  Administered 2014-03-01: 16:00:00 via INTRAVENOUS

## 2014-03-01 MED ORDER — ONDANSETRON HCL 4 MG/2ML IJ SOLN
INTRAMUSCULAR | Status: AC
  Filled 2014-03-01: qty 2

## 2014-03-01 MED ORDER — HYDROMORPHONE HCL PF 1 MG/ML IJ SOLN: 0.2500 mg | INTRAMUSCULAR | Status: DC | PRN

## 2014-03-01 MED ORDER — PROPOFOL 10 MG/ML IV BOLUS
INTRAVENOUS | Status: AC
  Filled 2014-03-01: qty 20

## 2014-03-01 SURGICAL SUPPLY — 52 items
ADH SKN CLS APL DERMABOND .7 (GAUZE/BANDAGES/DRESSINGS)
APL SKNCLS STERI-STRIP NONHPOA (GAUZE/BANDAGES/DRESSINGS) ×1
APPLIER CLIP 5 13 M/L LIGAMAX5 (MISCELLANEOUS)
APR CLP MED LRG 5 ANG JAW (MISCELLANEOUS)
BENZOIN TINCTURE PRP APPL 2/3 (GAUZE/BANDAGES/DRESSINGS) ×3 IMPLANT
BINDER ABDOMINAL 12 ML 46-62 (SOFTGOODS) ×2 IMPLANT
CANISTER SUCTION 2500CC (MISCELLANEOUS) ×1 IMPLANT
CLIP APPLIE 5 13 M/L LIGAMAX5 (MISCELLANEOUS) IMPLANT
CLOSURE WOUND 1/2 X4 (GAUZE/BANDAGES/DRESSINGS) ×2
DECANTER SPIKE VIAL GLASS SM (MISCELLANEOUS) ×3 IMPLANT
DERMABOND ADVANCED (GAUZE/BANDAGES/DRESSINGS)
DERMABOND ADVANCED .7 DNX12 (GAUZE/BANDAGES/DRESSINGS) IMPLANT
DEVICE SECURE STRAP 25 ABSORB (INSTRUMENTS) ×5 IMPLANT
DEVICE TROCAR PUNCTURE CLOSURE (ENDOMECHANICALS) ×3 IMPLANT
DRAPE LAPAROSCOPIC ABDOMINAL (DRAPES) ×3 IMPLANT
ELECT REM PT RETURN 9FT ADLT (ELECTROSURGICAL) ×3
ELECTRODE REM PT RTRN 9FT ADLT (ELECTROSURGICAL) ×1 IMPLANT
GAUZE SPONGE 2X2 8PLY STRL LF (GAUZE/BANDAGES/DRESSINGS) ×1 IMPLANT
GLOVE BIO SURGEON STRL SZ7.5 (GLOVE) ×6 IMPLANT
GLOVE BIOGEL PI IND STRL 7.0 (GLOVE) ×1 IMPLANT
GLOVE BIOGEL PI INDICATOR 7.0 (GLOVE) ×6
GLOVE SURG SS PI 6.5 STRL IVOR (GLOVE) ×6 IMPLANT
GOWN STRL REUS W/ TWL XL LVL3 (GOWN DISPOSABLE) ×1 IMPLANT
GOWN STRL REUS W/TWL LRG LVL3 (GOWN DISPOSABLE) ×3 IMPLANT
GOWN STRL REUS W/TWL XL LVL3 (GOWN DISPOSABLE) ×8 IMPLANT
KIT BASIN OR (CUSTOM PROCEDURE TRAY) ×3 IMPLANT
MARKER SKIN DUAL TIP RULER LAB (MISCELLANEOUS) ×3 IMPLANT
MESH VENTRALIGHT ST 6X8 (Mesh Specialty) ×3 IMPLANT
MESH VENTRLGHT ELLIPSE 8X6XMFL (Mesh Specialty) IMPLANT
NDL INSUFFLATION 14GA 120MM (NEEDLE) ×1 IMPLANT
NDL SPNL 22GX3.5 QUINCKE BK (NEEDLE) ×1 IMPLANT
NEEDLE INSUFFLATION 14GA 120MM (NEEDLE) ×3 IMPLANT
NEEDLE SPNL 22GX3.5 QUINCKE BK (NEEDLE) ×3 IMPLANT
NS IRRIG 1000ML POUR BTL (IV SOLUTION) ×3 IMPLANT
PENCIL BUTTON HOLSTER BLD 10FT (ELECTRODE) IMPLANT
SET IRRIG TUBING LAPAROSCOPIC (IRRIGATION / IRRIGATOR) IMPLANT
SHEARS HARMONIC ACE PLUS 36CM (ENDOMECHANICALS) IMPLANT
SLEEVE XCEL OPT CAN 5 100 (ENDOMECHANICALS) ×8 IMPLANT
SOLUTION ANTI FOG 6CC (MISCELLANEOUS) ×3 IMPLANT
SPONGE GAUZE 2X2 STER 10/PKG (GAUZE/BANDAGES/DRESSINGS) ×2
STRIP CLOSURE SKIN 1/2X4 (GAUZE/BANDAGES/DRESSINGS) ×4 IMPLANT
SUT CHROMIC 2 0 SH (SUTURE) ×5 IMPLANT
SUT MNCRL AB 4-0 PS2 18 (SUTURE) ×3 IMPLANT
SUT NOVA NAB DX-16 0-1 5-0 T12 (SUTURE) ×4 IMPLANT
SUT PROLENE 2 0 KS (SUTURE) ×8 IMPLANT
TOWEL OR 17X26 10 PK STRL BLUE (TOWEL DISPOSABLE) ×3 IMPLANT
TRAY FOLEY CATH 14FRSI W/METER (CATHETERS) ×3 IMPLANT
TRAY LAP CHOLE (CUSTOM PROCEDURE TRAY) ×3 IMPLANT
TROCAR BLADELESS OPT 5 100 (ENDOMECHANICALS) ×2 IMPLANT
TROCAR BLADELESS OPT 5 75 (ENDOMECHANICALS) ×3 IMPLANT
TROCAR XCEL 12X100 BLDLESS (ENDOMECHANICALS) ×1 IMPLANT
TROCAR XCEL NON-BLD 11X100MML (ENDOMECHANICALS) ×2 IMPLANT

## 2014-03-01 NOTE — Anesthesia Preprocedure Evaluation (Signed)
Anesthesia Evaluation  Patient identified by MRN, date of birth, ID band Patient awake    Reviewed: Allergy & Precautions, H&P , NPO status , Patient's Chart, lab work & pertinent test results  Airway Mallampati: III TM Distance: <3 FB Neck ROM: Full    Dental no notable dental hx.    Pulmonary sleep apnea , former smoker,  breath sounds clear to auscultation  Pulmonary exam normal       Cardiovascular hypertension, Pt. on home beta blockers and Pt. on medications + CAD Rhythm:Regular Rate:Normal     Neuro/Psych negative neurological ROS  negative psych ROS   GI/Hepatic negative GI ROS, Neg liver ROS,   Endo/Other  diabetes, Insulin Dependent  Renal/GU Renal InsufficiencyRenal disease  negative genitourinary   Musculoskeletal negative musculoskeletal ROS (+)   Abdominal   Peds negative pediatric ROS (+)  Hematology negative hematology ROS (+)   Anesthesia Other Findings   Reproductive/Obstetrics negative OB ROS                           Anesthesia Physical Anesthesia Plan  ASA: III  Anesthesia Plan: General   Post-op Pain Management:    Induction: Intravenous  Airway Management Planned: Oral ETT  Additional Equipment:   Intra-op Plan:   Post-operative Plan: Extubation in OR  Informed Consent: I have reviewed the patients History and Physical, chart, labs and discussed the procedure including the risks, benefits and alternatives for the proposed anesthesia with the patient or authorized representative who has indicated his/her understanding and acceptance.   Dental advisory given  Plan Discussed with: CRNA and Surgeon  Anesthesia Plan Comments:         Anesthesia Quick Evaluation

## 2014-03-01 NOTE — Transfer of Care (Signed)
Immediate Anesthesia Transfer of Care Note  Patient: [name]  Procedure(s) Performed: Procedure(s): LAPAROSCOPIC INCISIONAL HERNIA (N/A) INSERTION OF MESH (N/A)  Patient Location: PACU  Anesthesia Type:General  Level of Consciousness: awake and alert   Airway & Oxygen Therapy: Patient Spontanous Breathing and Patient connected to face mask oxygen  Post-op Assessment: Report given to PACU RN and Post -op Vital signs reviewed and stable  Post vital signs: Reviewed and stable  Complications: No apparent anesthesia complications

## 2014-03-01 NOTE — Discharge Instructions (Signed)
[facility] _______[facility]  UMBILICAL OR INGUINAL HERNIA REPAIR: POST OP INSTRUCTIONS  Always review your discharge instruction sheet given to you by the facility where your surgery was performed. IF YOU HAVE DISABILITY OR FAMILY LEAVE FORMS, YOU MUST BRING THEM TO THE OFFICE FOR PROCESSING.   DO NOT GIVE THEM TO YOUR DOCTOR.  1. A  prescription for pain medication may be given to you upon discharge.  Take your pain medication as prescribed, if needed.  If narcotic pain medicine is not needed, then you may take acetaminophen (Tylenol) or ibuprofen (Advil) as needed. 2. Take your usually prescribed medications unless otherwise directed. 3. If you need a refill on your pain medication, please contact your pharmacy.  They will contact our office to request authorization. Prescriptions will not be filled after 5 pm or on week-ends. 4. You should follow a light diet the first 24 hours after arrival home, such as soup and crackers, etc.  Be sure to include lots of fluids daily.  Resume your normal diet the day after surgery. 5. Most patients will experience some swelling and bruising around the umbilicus or in the groin and scrotum.  Ice packs and reclining will help.  Swelling and bruising can take several days to resolve.  6. It is common to experience some constipation if taking pain medication after surgery.  Increasing fluid intake and taking a stool softener (such as Colace) will usually help or prevent this problem from occurring.  A mild laxative (Milk of Magnesia or Miralax) should be taken according to package directions if there are no bowel movements after 48 hours. 7. Unless discharge instructions indicate otherwise, you may remove your bandages 24-48 hours after surgery, and you may shower at that time.  You may have steri-strips (small skin tapes) in place directly over the incision.  These strips should be left on the skin for 7-10 days.  If your surgeon used skin glue on the  incision, you may shower in 24 hours.  The glue will flake off over the next 2-3 weeks.  Any sutures or staples will be removed at the office during your follow-up visit. 8. ACTIVITIES:  You may resume regular (light) daily activities beginning the next day--such as daily self-care, walking, climbing stairs--gradually increasing activities as tolerated.  You may have sexual intercourse when it is comfortable.  Refrain from any heavy lifting or straining until approved by your doctor. a. You may drive when you are no longer taking prescription pain medication, you can comfortably wear a seatbelt, and you can safely maneuver your car and apply brakes. b. RETURN TO WORK:  __________________________________________________________ 9. You should see your doctor in the office for a follow-up appointment approximately 2-3 weeks after your surgery.  Make sure that you call for this appointment within a day or two after you arrive home to insure a convenient appointment time. 10. OTHER INSTRUCTIONS:  __________________________________________________________________________________________________________________________________________________________________________________________  WHEN TO CALL YOUR DOCTOR: 1. Fever over 101.0 2. Inability to urinate 3. Nausea and/or vomiting 4. Extreme swelling or bruising 5. Continued bleeding from incision. 6. Increased pain, redness, or drainage from the incision  The clinic staff is available to answer your questions during regular business hours.  Please don't hesitate to call and ask to speak to one of the nurses for clinical concerns.  If you have a medical emergency, go to the nearest emergency room or call 911.  A surgeon from [facility] is always on call at the hospital     [address], [address], [place], [place]  [address] ?  [address] [address], [place], [place]   [address] [phone] ? [phone] ? FAX [phone] Web site:  [email or web address]  

## 2014-03-01 NOTE — Interval H&P Note (Signed)
History and Physical Interval Note:  03/01/2014 1:38 PM  [name]  has presented today for surgery, with the diagnosis of incisional hernia   The various methods of treatment have been discussed with the patient and family. After consideration of risks, benefits and other options for treatment, the patient has consented to  Procedure(s): LAPAROSCOPIC INCISIONAL HERNIA (N/A) INSERTION OF MESH (N/A) as a surgical intervention .  The patient's history has been reviewed, patient examined, no change in status, stable for surgery.  I have reviewed the patient's chart and labs.  Questions were answered to the patient's satisfaction.     Axel FillerArmando Reighan Hipolito

## 2014-03-01 NOTE — Anesthesia Procedure Notes (Signed)
Procedure Name: Intubation Date/Time: 03/01/2014 2:12 PM Performed by: Leroy LibmanEARDON, Marisella Puccio L Patient Re-evaluated:Patient Re-evaluated prior to inductionOxygen Delivery Method: Circle system utilized Preoxygenation: Pre-oxygenation with 100% oxygen Intubation Type: IV induction Ventilation: Mask ventilation without difficulty and Oral airway inserted - appropriate to patient size Laryngoscope Size: Hyacinth MeekerMiller and 2 Grade View: Grade I Tube type: Oral Tube size: 7.5 mm Number of attempts: 1 Airway Equipment and Method: Stylet Placement Confirmation: ETT inserted through vocal cords under direct vision,  breath sounds checked- equal and bilateral and positive ETCO2 Secured at: 22 cm Dental Injury: Teeth and Oropharynx as per pre-operative assessment

## 2014-03-01 NOTE — Anesthesia Postprocedure Evaluation (Signed)
  Anesthesia Post-op Note  Patient: [name]  Procedure(s) Performed: Procedure(s) (LRB): LAPAROSCOPIC INCISIONAL HERNIA (N/A) INSERTION OF MESH (N/A)  Patient Location: PACU  Anesthesia Type: General  Level of Consciousness: awake and alert   Airway and Oxygen Therapy: Patient Spontanous Breathing  Post-op Pain: mild  Post-op Assessment: Post-op Vital signs reviewed, Patient's Cardiovascular Status Stable, Respiratory Function Stable, Patent Airway and No signs of Nausea or vomiting  Last Vitals:  Filed Vitals:   03/01/14 1700  BP:   Pulse:   Temp:   Resp: 12    Post-op Vital Signs: stable   Complications: No apparent anesthesia complications

## 2014-03-01 NOTE — Op Note (Signed)
03/01/2014  4:28 PM  PATIENT:  Dyke MaesAlice Mccamy  63 y.o. female  PRE-OPERATIVE DIAGNOSIS:  incisional hernia   POST-OPERATIVE DIAGNOSIS:  incisional hernia  PROCEDURE:  Procedure(s): LAPAROSCOPIC INCARCERATED INCISIONAL HERNIA (N/A) INSERTION OF MESH (N/A) AND LYSIS OF ADHESIONS  SURGEON:  Surgeon(s) and Role:    * Axel FillerArmando Althea Backs, MD - Primary  PHYSICIAN ASSISTANT:   ASSISTANTS: none   ANESTHESIA:   local and general  EBL:  Total I/O In: 1000 [I.V.:1000] Out: 400 [Urine:350; Blood:50]  BLOOD ADMINISTERED:none  DRAINS: none   LOCAL MEDICATIONS USED:  BUPIVICAINE   SPECIMEN:  No Specimen  DISPOSITION OF SPECIMEN:  N/A  COUNTS:  YES  TOURNIQUET:  * No tourniquets in log *  DICTATION: .Dragon Dictation Details of the procedure:   After the patient was consented patient was taken back to the operating room patient was then placed in supine position bilateral SCDs in place. After antibiotics were confirmed a timeout was called and all facts were verified. The Veress needle technique was used to insuflate the abdomen at Palmer's point. The abdomen was insufflated to 14 mm mercury. Subsequently a 5 mm trocar was placed a camera inserted there was no injury to any intra-abdominal organs.  There is large amount of adhesions, omental and small bowel adhesions, to the anterior abdominal wall.   A second camera port was in placed into the left lower quadrant.  At this time I proceeded to take down the adhesions to the anterior abdominal wall bluntly and sharply. This was approximately 1.75 hours of lysis of adhesions.  To assist with lysis of adhesions a third left lower quadrant port was placed, as was a right upper quadrant and right lower quadrant. Once the small bowel and the omentum was taken down from the anterior abdominal wall it was obvious there was 2 incisional hernia defect at the umbilicus. This defect was brought together using 0 Novafils via an Endo Close device and  interrupted fashion x3.  Once the hernia defects were reapproximated piece of Bard ventralex, 20 x 15 cm mesh was then introduced into the abdomen. The right upper quadrant 5 mm port site was then upsized to a 10 mm port site.  The mesh was secured circumferentially with a Securestrap tacker in a double crown fashion.  The mesh overlay the hernia defects by more than 4 cm in all directions. 2-0 Prolene are then used to take the mesh in a transfacial manner and a 12:00, 3:00, 6:00, 9:00 positions. At this time proceeded to reexamine the small bowel the omentum which was hemostatic. The insufflation was evacuated & all trocars  were removed. The skin was reapproximated with 4-0  Monocryl sutures in a subcuticular fashion. The skin was dressed with Steri-Strips tape and gauze.  The patient was taken to the recovery room in stable condition.    PLAN OF CARE: Discharge to home after PACU  PATIENT DISPOSITION:  PACU - hemodynamically stable.   Delay start of Pharmacological VTE agent (>24hrs) due to surgical blood loss or risk of bleeding: not applicable

## 2014-03-01 NOTE — H&P (View-Only) (Signed)
Patient ID: [name], female   DOB: [birth year], 63 y.o.   MRN: [id number]  Chief Complaint  Patient presents with  . Follow-up    HPI [name] is a 63 y.o. female.  The patient is a 63 year old female who is processing the hospital manner for abdominal pain. Patient underwent CT scan revealed incisional hernia in the midline, near the umbilicus. The patient was not obstructed and/or incarcerated/strangulated. Patient was just discharged. Patient presents today for surgical evaluation.  HPI  Past Medical History  Diagnosis Date  . Hypertension   . Peripheral neuropathy   . Cervicalgia   . Low back pain syndrome   . Hyperlipidemia   . Vitamin D deficiency   . Vitamin B12 deficiency   . Iliotibial band syndrome   . Anemia   . Borderline personality disorder   . Nonorganic psychosis   . GERD (gastroesophageal reflux disease)   . Diabetes mellitus   . Depression   . Diabetic gastroparesis   . Sleep apnea   . CAD (coronary artery disease)     Catherization 11/18/09>nonobstructive CAD , normal LV function, EF 65%  . Health maintenance examination     Colonoscopy 2009> normal; Diabetic eye exam 12/2008. No diabetic retinopathy; Mammogram 11/10> No evidence of malignancy, DXA 03/14/13 : normal  . Headache(784.0)   . Arthritis   . SBO (small bowel obstruction) 01/09/2013  . Diastolic CHF     Grade I, on Echo 06/2013, EF 55-60%    Past Surgical History  Procedure Laterality Date  . Abdominal hysterectomy    . Hand surgery    . Bowel resection N/A 01/10/2013    Procedure: SMALL BOWEL RESECTION;  Surgeon: Lodema PilotBrian Layton, DO;  Location: [facility] OR;  Service: General;  Laterality: N/A;  . Lysis of adhesion N/A 01/10/2013    Procedure: LYSIS OF ADHESION;  Surgeon: Lodema PilotBrian Layton, DO;  Location: [facility] OR;  Service: General;  Laterality: N/A;  . Laparotomy N/A 01/10/2013    Procedure: EXPLORATORY LAPAROTOMY;  Surgeon: Lodema PilotBrian Layton, DO;  Location: [facility] OR;  Service: General;  Laterality: N/A;     Family History  Problem Relation Age of Onset  . Diabetes Mother   . Hypertension Mother   . Hyperlipidemia Mother   . Arthritis Mother   . Depression Mother   . Heart disease Father   . Diabetes Sister   . Diabetes Brother   . Heart disease Brother   . Heart disease Paternal Grandmother   . Seizures Brother     Social History History  Substance Use Topics  . Smoking status: Former Smoker    Types: Cigarettes    Quit date: 10/25/2002  . Smokeless tobacco: Never Used  . Alcohol Use: No    Allergies  Allergen Reactions  . Cimetidine Other (See Comments)    Breast swelling  . Indomethacin Diarrhea    Caused severe diarrhea with episodes of incontinance  . Penicillins Hives and Itching    Other reaction(s): Delusions (intolerance)  . Indomethacin Other (See Comments)    Severe diarrhea  . Sulfonamide Derivatives     unknown    Current Outpatient Prescriptions  Medication Sig Dispense Refill  . ACCU-CHEK FASTCLIX LANCETS MISC       . ACCU-CHEK SMARTVIEW test strip       . aspirin 81 MG chewable tablet Chew 81 mg by mouth daily.      Marland Kitchen. buPROPion (WELLBUTRIN XL) 150 MG 24 hr tablet Take 3 tablets (450 mg total) by  mouth daily.  90 tablet  5  . cloNIDine (CATAPRES - DOSED IN MG/24 HR) 0.2 mg/24hr patch Place 1 patch (0.2 mg total) onto the skin every 7 (seven) days.  4 patch  0  . dexlansoprazole (DEXILANT) 60 MG capsule Take 60 mg by mouth daily.        Tery Sanfilippo. Docusate Sodium (COLACE PO) Take 2 tablets by mouth daily as needed (constipation).      Marland Kitchen. FLUoxetine (PROZAC) 20 MG capsule Take 1 capsule (20 mg total) by mouth every morning.  30 capsule  5  . furosemide (LASIX) 40 MG tablet Take 1 tablet (40 mg total) by mouth daily.  30 tablet  11  . gabapentin (NEURONTIN) 800 MG tablet Take 800-1,600 mg by mouth 2 (two) times daily. 800 mg in the am and 1600 mg in the evening.      Marland Kitchen. HYDROcodone-acetaminophen (NORCO/VICODIN) 5-325 MG per tablet Take 1 tablet by mouth 3 (three)  times daily as needed for moderate pain.  15 tablet  0  . insulin aspart (NOVOLOG) 100 UNIT/ML injection Inject 10 Units into the skin.      Marland Kitchen. isosorbide mononitrate (IMDUR) 60 MG 24 hr tablet Take 1 tablet (60 mg total) by mouth daily.  30 tablet  6  . LANTUS SOLOSTAR 100 UNIT/ML Solostar Pen       . losartan (COZAAR) 100 MG tablet Take 0.5 tablets (50 mg total) by mouth daily.  30 tablet  11  . metoprolol succinate (TOPROL-XL) 25 MG 24 hr tablet Take 1 tablet (25 mg total) by mouth daily.  30 tablet  11  . ondansetron (ZOFRAN) 4 MG tablet Take 1 tablet (4 mg total) by mouth every 6 (six) hours as needed for nausea.  20 tablet  0  . senna (SENOKOT) 8.6 MG TABS Take 4 tablets by mouth daily as needed (constipation).      . temazepam (RESTORIL) 15 MG capsule take 1 capsule by mouth at bedtime if needed for sleep  30 capsule  0   No current facility-administered medications for this visit.    Review of Systems Review of Systems  Constitutional: Negative.   HENT: Negative.   Respiratory: Negative.   Cardiovascular: Negative.   Gastrointestinal: Negative.   Neurological: Negative.   All other systems reviewed and are negative.   Blood pressure 150/90, pulse 89, temperature 97.6 F (36.4 C), resp. rate 16, height 5\' 5"  (1.651 m), weight 201 lb (91.173 kg).  Physical Exam Physical Exam  Constitutional: She is oriented to person, place, and time. She appears well-developed and well-nourished.  HENT:  Head: Normocephalic and atraumatic.  Eyes: Conjunctivae and EOM are normal. Pupils are equal, round, and reactive to light.  Neck: Normal range of motion. Neck supple.  Cardiovascular: Normal rate, regular rhythm and normal heart sounds.   Pulmonary/Chest: Effort normal and breath sounds normal.  Abdominal: Soft. Bowel sounds are normal. A hernia is present. Hernia confirmed positive in the ventral area.    Musculoskeletal: Normal range of motion.  Neurological: She is alert and oriented  to person, place, and time.  Skin: Skin is warm and dry.  Psychiatric: She has a normal mood and affect.    Data Reviewed CT scan revealed midline incisional hernia from previous operation.  Assessment    63 year old female with an incisional hernia.     Plan    1. We'll proceed to operative for laparoscopic incisional hernia repair with Mesh. 2. Discussed with the patient the possibility of  having convert to an open procedure should the scar tissue and/or anatomy be difficult. 3. All risks and benefits were discussed with the patient, to generally include infection, bleeding, damage to surrounding structures, acute and chronic nerve pain, and recurrence. Alternatives were offered and described.  All questions were answered and the patient voiced understanding of the procedure and wishes to proceed at this point.         Axel Fillerrmando Alondra Sahni 02/05/2014, 2:54 PM

## 2014-03-01 NOTE — Progress Notes (Addendum)
Patient up to recliner chair after returning from PACU after incisional hernia repair. Ambulated around entire unit. Patient is unable to void. Foley discontinued at 1430. 240 ml in by PO. 500 by IV.  2000   Total in 1384 by IV. 240 PO.  Bladder scanned for 145 cc. Pt instructed to come back to ER if unable to void by AM. She and husband verbalize understanding.

## 2014-03-04 ENCOUNTER — Encounter (HOSPITAL_COMMUNITY): Payer: Self-pay | Admitting: General Surgery

## 2014-03-08 ENCOUNTER — Encounter: Payer: Self-pay | Admitting: Internal Medicine

## 2014-03-08 ENCOUNTER — Ambulatory Visit (INDEPENDENT_AMBULATORY_CARE_PROVIDER_SITE_OTHER): Payer: PRIVATE HEALTH INSURANCE | Admitting: Internal Medicine

## 2014-03-08 VITALS — BP 130/81 | HR 67 | Ht 65.0 in | Wt 203.4 lb

## 2014-03-08 DIAGNOSIS — M549 Dorsalgia, unspecified: Secondary | ICD-10-CM

## 2014-03-08 DIAGNOSIS — M479 Spondylosis, unspecified: Secondary | ICD-10-CM

## 2014-03-08 DIAGNOSIS — E1165 Type 2 diabetes mellitus with hyperglycemia: Secondary | ICD-10-CM

## 2014-03-08 DIAGNOSIS — IMO0002 Reserved for concepts with insufficient information to code with codable children: Secondary | ICD-10-CM

## 2014-03-08 DIAGNOSIS — M1990 Unspecified osteoarthritis, unspecified site: Secondary | ICD-10-CM

## 2014-03-08 DIAGNOSIS — I10 Essential (primary) hypertension: Secondary | ICD-10-CM

## 2014-03-08 DIAGNOSIS — E785 Hyperlipidemia, unspecified: Secondary | ICD-10-CM

## 2014-03-08 DIAGNOSIS — K429 Umbilical hernia without obstruction or gangrene: Secondary | ICD-10-CM

## 2014-03-08 DIAGNOSIS — I129 Hypertensive chronic kidney disease with stage 1 through stage 4 chronic kidney disease, or unspecified chronic kidney disease: Secondary | ICD-10-CM

## 2014-03-08 DIAGNOSIS — E559 Vitamin D deficiency, unspecified: Secondary | ICD-10-CM

## 2014-03-08 DIAGNOSIS — N183 Chronic kidney disease, stage 3 unspecified: Secondary | ICD-10-CM

## 2014-03-08 DIAGNOSIS — IMO0001 Reserved for inherently not codable concepts without codable children: Secondary | ICD-10-CM

## 2014-03-08 DIAGNOSIS — Z794 Long term (current) use of insulin: Secondary | ICD-10-CM

## 2014-03-08 LAB — GLUCOSE, CAPILLARY: Glucose-Capillary: 124 mg/dL — ABNORMAL HIGH (ref 70–99)

## 2014-03-08 MED ORDER — ROSUVASTATIN CALCIUM 10 MG PO TABS: 10.0000 mg | ORAL_TABLET | Freq: Every day | ORAL | Status: DC

## 2014-03-08 MED ORDER — OXYCODONE-ACETAMINOPHEN 5-325 MG PO TABS: 1.0000 | ORAL_TABLET | Freq: Three times a day (TID) | ORAL | Status: DC | PRN

## 2014-03-08 MED ORDER — ERGOCALCIFEROL 1.25 MG (50000 UT) PO CAPS: 50000.0000 [IU] | ORAL_CAPSULE | ORAL | Status: DC

## 2014-03-08 MED ORDER — CALCIUM CITRATE-VITAMIN D 315-200 MG-UNIT PO TABS: 2.0000 | ORAL_TABLET | Freq: Two times a day (BID) | ORAL | Status: DC

## 2014-03-08 NOTE — Progress Notes (Signed)
Case discussed with Dr. Sharda soon after the resident saw the patient.  We reviewed the resident's history and exam and pertinent patient test results.  I agree with the assessment, diagnosis, and plan of care documented in the resident's note. 

## 2014-03-08 NOTE — Patient Instructions (Addendum)
-  Regarding your Diabetes: If your mealtime sugar is more than 250, add 5 units of mealtime insulin (novolog) to the 8 units you were already planning to take  -Regarding your back pain: I am prescribing Oxycodone-acetaminophen 1- 2 tablets (of the 5-325 dosing) every 8 house as needed, #180.  This should last until your pain clinic appointment - hopefully they will reconsider injection and you won't need as much pain medicine.  Be sure to keep having daily bowel movements, if you get constipated, be more aggressive!  -Regarding your cholesterol, we are starting crestor 10mg  every night  -Regarding your low vitamin D - take 50,000 units once weekly for 3-4 months.  Thank you for bringing your medicines today. This helps us keep you safe from mistakes.  Please be sure to bring all of your medications with you to every visit.  Should you have any new or worsening symptoms, please be sure to call the clinic at [phone].

## 2014-03-08 NOTE — Assessment & Plan Note (Addendum)
CBGs significantly improved, but she feels overly constricted with her diet.  Will continue lantus 25u qHS and novolog 8u TIDWC, add 5u to mealtime insulin if CBG>250. RTC in 1 month for A1c

## 2014-03-08 NOTE — Assessment & Plan Note (Signed)
BP Readings from Last 3 Encounters:  03/08/14 130/81  03/01/14 157/68  03/01/14 157/68    Lab Results  Component Value Date   NA 142 02/25/2014   K 5.1 02/25/2014   CREATININE 1.81* 02/25/2014    Assessment: Blood pressure control:  controlled Progress toward BP goal:   at goal  Plan: Medications:  continue current medications - losartan 50, clonidine patch 0.2, metop 25 daily, lasix 40, imdur 60 Educational resources provided: Education officer, [employer] Self management tools provided:   Other plans: BMET today

## 2014-03-08 NOTE — Progress Notes (Signed)
Subjective:   Patient ID: [name] female   DOB: 1951-10-17 [age] y.o.   MRN: [id number]  HPI: [name] is a 63 y.o. woman with DM, CAD, GERD & HTN who presents for DM follow up.  Recently underwent lab incisional hernia repair on 03/01/14 - surgery went well, pain improving day by day, some moments bad, some moments good.    Back pain (L4-L5, L5-S1 degen joint disease): Made appt with pain clinic, next month - June 22nd?  Good with staying still, worse with movement.  Tries ice, rest, meds.  Feels like it is limiting her participation in activities.  Taking percocet 10-325, 1 q4h #30, given by surgeon, took last one this morning - last 3 days has been taking three/day.  I provided percocet 5-325 on 02/22/14 #60, she still has some of those (24 in bottle today, was taking 2 at a time)  She has some "taking charge of my health" paperwork from her insurance company.     Review of Systems: Constitutional: Denies fever, chills, diaphoresis, appetite change and fatigue.  HEENT: Denies photophobia, eye pain, redness, hearing loss, ear pain, congestion, sore throat, rhinorrhea, sneezing, mouth sores, trouble swallowing, neck pain, neck stiffness and tinnitus.  Respiratory: Denies SOB, DOE, cough, chest tightness, and wheezing.  Cardiovascular: Denies chest pain, palpitations. Leg swelling if standing for a long time Gastrointestinal: Denies vomiting, abdominal pain, diarrhea, constipation,blood in stool and abdominal distention. Daily BMs.  +occ nausea Genitourinary: Denies dysuria, urgency, frequency, hematuria, flank pain and difficulty urinating. A little hesitation right now Musculoskeletal: Myalgias improved, +chronic back pain Skin: Denies pallor, rash and wound.  Neurological: Denies seizures, syncope, weakness, numbness and headaches. "wave" sometimes when standing quickly, occ HA in back of head Hematological: No s/s of bleeding Psychiatric/Behavioral: Denies suicidal ideation, has  been dismissing negativity, trying to get out and do things, but limited by pain   Past Medical History  Diagnosis Date  . Hypertension   . Peripheral neuropathy   . Cervicalgia   . Low back pain syndrome   . Hyperlipidemia   . Vitamin D deficiency   . Vitamin B12 deficiency   . Iliotibial band syndrome   . Anemia   . Borderline personality disorder   . Nonorganic psychosis   . GERD (gastroesophageal reflux disease)   . Diabetes mellitus   . Depression   . Diabetic gastroparesis   . CAD (coronary artery disease)     Catherization 11/18/09>nonobstructive CAD , normal LV function, EF 65%  . Health maintenance examination     Colonoscopy 2009> normal; Diabetic eye exam 12/2008. No diabetic retinopathy; Mammogram 11/10> No evidence of malignancy, DXA 03/14/13 : normal  . Headache(784.0)   . Arthritis   . SBO (small bowel obstruction) 01/09/2013  . Diastolic CHF     Grade I, on Echo 06/2013, EF 55-60%  . Sleep apnea     does not wear CPAP    Current Outpatient Prescriptions  Medication Sig Dispense Refill  . aspirin 81 MG chewable tablet Chew 81 mg by mouth daily.      Marland Kitchen. buPROPion (WELLBUTRIN XL) 150 MG 24 hr tablet Take 450 mg by mouth every morning.      . cloNIDine (CATAPRES - DOSED IN MG/24 HR) 0.2 mg/24hr patch Place 1 patch (0.2 mg total) onto the skin every 7 (seven) days.  4 patch  1  . dexlansoprazole (DEXILANT) 60 MG capsule Take 60 mg by mouth daily.        .Marland Kitchen  Docusate Sodium (COLACE PO) Take 2 tablets by mouth daily as needed (constipation).      Marland Kitchen. FLUoxetine (PROZAC) 20 MG capsule Take 1 capsule (20 mg total) by mouth every morning.  30 capsule  5  . furosemide (LASIX) 40 MG tablet Take 40 mg by mouth every morning.      . gabapentin (NEURONTIN) 800 MG tablet Take 800-1,600 mg by mouth 2 (two) times daily. 800 mg in the am and 1600 mg in the evening.      . insulin aspart (NOVOLOG) 100 UNIT/ML injection Inject 8 Units into the skin 3 (three) times daily with meals.  10 mL   11  . Insulin Glargine (LANTUS SOLOSTAR) 100 UNIT/ML Solostar Pen Inject 25 Units into the skin daily at 10 pm.  15 mL  11  . isosorbide mononitrate (IMDUR) 60 MG 24 hr tablet Take 60 mg by mouth every morning.      Marland Kitchen. losartan (COZAAR) 100 MG tablet Take 50 mg by mouth every morning.      . metoprolol succinate (TOPROL-XL) 25 MG 24 hr tablet Take 25 mg by mouth every morning.      . ondansetron (ZOFRAN) 4 MG tablet Take 1 tablet (4 mg total) by mouth every 6 (six) hours as needed for nausea.  20 tablet  0  . oxyCODONE-acetaminophen (PERCOCET) 10-325 MG per tablet Take 1 tablet by mouth every 4 (four) hours as needed for pain.  30 tablet  0  . oxyCODONE-acetaminophen (PERCOCET/ROXICET) 5-325 MG per tablet Take 2 tablets by mouth every 8 (eight) hours as needed for moderate pain or severe pain.  60 tablet  0  . senna (SENOKOT) 8.6 MG TABS Take 4 tablets by mouth daily as needed (constipation).      . temazepam (RESTORIL) 15 MG capsule take 1 capsule by mouth at bedtime if needed for sleep  30 capsule  0   No current facility-administered medications for this visit.   Family History  Problem Relation Age of Onset  . Diabetes Mother   . Hypertension Mother   . Hyperlipidemia Mother   . Arthritis Mother   . Depression Mother   . Heart disease Father   . Diabetes Sister   . Diabetes Brother   . Heart disease Brother   . Heart disease Paternal Grandmother   . Seizures Brother    History   Social History  . Marital Status: Married    Spouse Name: N/A    Number of Children: N/A  . Years of Education: N/A   Social History Main Topics  . Smoking status: Former Smoker    Types: Cigarettes    Quit date: 10/25/2002  . Smokeless tobacco: Never Used  . Alcohol Use: No  . Drug Use: No  . Sexual Activity: Yes   Other Topics Concern  . None   Social History Narrative  . None    Objective:  Physical Exam: Filed Vitals:   03/08/14 1039  BP: 130/81  Pulse: 67  Height: 5\' 5"  (1.651  m)  Weight: 203 lb 6.4 oz (92.262 kg)  SpO2: 98%   General: pleasant, appear as stated age,  HEENT: PERRL, EOMI, no scleral icterus Cardiac: RRR, no rubs, murmurs or gallops Pulm: clear to auscultation bilaterally, moving normal volumes of air Abd: soft, nondistended, BS normoactive, mildly tender throughout, several steri-strips in place, no bleeding Ext: warm and well perfused, trace pretibial pitting edema Neuro: alert and oriented X3, cranial nerves II-XII grossly intact  Assessment &  Plan:   Case and care discussed with Dr. Meredith Pel.  Please see problem oriented charting for further details. Patient to return in next month for A1c check.

## 2014-03-08 NOTE — Assessment & Plan Note (Signed)
S/p repair 03/01/14 by Dr. Derrell Lollingamirez

## 2014-03-08 NOTE — Assessment & Plan Note (Signed)
She has 24 tabs of percocet 5-325 remaining (at 2 pills/dose TID, she has 12 doses that would last 4 days).  She has pain mgmt appt on 04/15/14.    -Encouraged her to use as little narcotics as possible, but enough to not limit her enjoyable activities like church activities -Prescribed Percocet 5-325, 1-2 tabs q8h prn #180 - this should hold her until her pain mgmt appt ,where hopefully they can reschedule back injection -Her current pain contract is essentially void as we are trying to bridge her to pain clinic, but to note, this patient has no red flags

## 2014-03-08 NOTE — Assessment & Plan Note (Signed)
Myalgias improved off simvastatin.  Start Crestor 10mg  qHS.

## 2014-03-08 NOTE — Assessment & Plan Note (Signed)
Ergocalciferol 50,000u qWeekly x 3-4 months, then recheck.

## 2014-03-09 LAB — BASIC METABOLIC PANEL
BUN: 35 mg/dL — ABNORMAL HIGH (ref 6–23)
CHLORIDE: 100 meq/L (ref 96–112)
CO2: 26 mEq/L (ref 19–32)
Calcium: 9.4 mg/dL (ref 8.4–10.5)
Creat: 1.83 mg/dL — ABNORMAL HIGH (ref 0.50–1.10)
Glucose, Bld: 102 mg/dL — ABNORMAL HIGH (ref 70–99)
POTASSIUM: 4.3 meq/L (ref 3.5–5.3)
Sodium: 137 mEq/L (ref 135–145)

## 2014-03-11 NOTE — Assessment & Plan Note (Signed)
Cr stable at 1.8, though higher than it has been recently.  I have f/u with patient on 04/10/14 - will recheck BMET at that time.

## 2014-03-20 ENCOUNTER — Ambulatory Visit (INDEPENDENT_AMBULATORY_CARE_PROVIDER_SITE_OTHER): Payer: Medicare Other | Admitting: General Surgery

## 2014-03-20 ENCOUNTER — Encounter (INDEPENDENT_AMBULATORY_CARE_PROVIDER_SITE_OTHER): Payer: Self-pay | Admitting: General Surgery

## 2014-03-20 VITALS — BP 128/74 | HR 80 | Temp 97.6°F | Resp 18 | Ht 65.0 in | Wt 196.4 lb

## 2014-03-20 DIAGNOSIS — Z9889 Other specified postprocedural states: Secondary | ICD-10-CM

## 2014-03-20 NOTE — Progress Notes (Signed)
Patient ID: [name], female   DOB: 1951-09-24, 63 y.o.   MRN: [id number] Post op course The patient is a 63 year old female status post left upper incisional hernia repair with mesh. Patient has been doing well aside from some normal expected abdominal pain and soreness. The patient is increasing her activity as tolerated  On Exam: Incisions are clean dry and intact, there is no hernia on palpation   Assessment and Plan 63 year old female status post laparoscopic incisional hernia repair with mesh 1. We discussed weightlifting restriction for another month. Patient can resume aerobic activities 2. Patient to follow up in one month's   Axel Filler, MD [facility], [place] [facility] Trauma & Emergency Surgery

## 2014-03-29 ENCOUNTER — Ambulatory Visit (INDEPENDENT_AMBULATORY_CARE_PROVIDER_SITE_OTHER): Payer: 59 | Admitting: Psychiatry

## 2014-03-29 DIAGNOSIS — F332 Major depressive disorder, recurrent severe without psychotic features: Secondary | ICD-10-CM

## 2014-03-29 DIAGNOSIS — F329 Major depressive disorder, single episode, unspecified: Secondary | ICD-10-CM

## 2014-03-29 DIAGNOSIS — F32A Depression, unspecified: Secondary | ICD-10-CM

## 2014-03-29 MED ORDER — FLUOXETINE HCL 20 MG PO CAPS: 20.0000 mg | ORAL_CAPSULE | Freq: Every morning | ORAL | Status: DC

## 2014-03-29 MED ORDER — BUPROPION HCL ER (XL) 150 MG PO TB24: 450.0000 mg | ORAL_TABLET | Freq: Every morning | ORAL | Status: DC

## 2014-03-29 NOTE — Progress Notes (Signed)
[facility]  03/29/2014 11:36 AM [name]  MRN:  [id number] Subjective: Better At this time the patient's mood is improved. She did benefit by increasing her Wellbutrin to 450. Interestingly since her last visit she's had abdominal surgery and hernia repair. She was still having abdominal pain but now is much better. The patient missed her last appointment because she had surgery. At this time the patient continues to have some disruption in her sleep and that she has a hard time falling asleep and staying asleep. At the patient does not have any daytime dysfunction. She takes no naps. She's not sleepy. She is eating well but has a low energy level. The patient is thinking and concentrating well the patient denies worthlessness she is not suicidal. She enjoys watching TV and reading. Overall the patient seems to be doing fairly well. Her biggest issue is related to her diabetes. Her last hemoglobin A1c was 10.4. The patient seems to be noncompliant with diabetes care. For me she's taking 450 Wellbutrin and 20 mg of Prozac. The patient ran out of her Restoril but in reality it didn't make much difference. I really didn't expect it to. The patient says she try some over-the-counter sleeping aids at work just as well but not clear exactly what he did. At this time we will not prescribe any sleeping aids but will continue her Wellbutrin and her Prozac. This patient is going to start in therapy with Victorino DikeJennifer brown in the next week or 2. I will see her again for in 3 months. Diagnosis:   DSM5: Schizophrenia Disorders:   Obsessive-Compulsive Disorders:   Trauma-Stressor Disorders:   Substance/Addictive Disorders:   Depressive Disorders:  Major Depressive Disorder - Mild (296.21) Total Time spent with patient: 30 minutes  Axis I: Major Depression, Recurrent severe  ADL's:  Intact  Sleep: Good  Appetite:  Good  Suicidal Ideation:  n0 Homicidal Ideation:  none AEB (as evidenced  by):  Psychiatric Specialty Exam: Physical Exam  ROS  There were no vitals taken for this visit.There is no weight on file to calculate BMI.  General Appearance: Casual  Eye Contact::  Good  Speech:  Clear and Coherent  Volume:  Normal  Mood:  Euthymic  Affect:  Congruent  Thought Process:  Coherent  Orientation:  Full (Time, Place, and Person)  Thought Content:  WDL  Suicidal Thoughts:  No  Homicidal Thoughts:  No  Memory:  NA  Judgement:  Intact  Insight:  Good  Psychomotor Activity:  Normal  Concentration:  Good  Recall:  Good  Fund of Knowledge:Good  Language: Good  Akathisia:  No  Handed:  Right  AIMS (if indicated):     Assets:  Desire for Improvement  Sleep:      Musculoskeletal: Strength & Muscle Tone:  Gait & Station:  Patient leans:   Current Medications: Current Outpatient Prescriptions  Medication Sig Dispense Refill  . ACCU-CHEK FASTCLIX LANCETS MISC       . ACCU-CHEK SMARTVIEW test strip       . aspirin 81 MG chewable tablet Chew 81 mg by mouth daily.      Marland Kitchen. buPROPion (WELLBUTRIN XL) 150 MG 24 hr tablet Take 3 tablets (450 mg total) by mouth every morning.  90 tablet  5  . cloNIDine (CATAPRES - DOSED IN MG/24 HR) 0.2 mg/24hr patch Place 1 patch (0.2 mg total) onto the skin every 7 (seven) days.  4 patch  1  . dexlansoprazole (DEXILANT) 60 MG capsule  Take 60 mg by mouth daily.        Tery Sanfilippo Sodium (COLACE PO) Take 2 tablets by mouth daily as needed (constipation).      Marland Kitchen ergocalciferol (VITAMIN D2) 50000 UNITS capsule Take 1 capsule (50,000 Units total) by mouth once a week.  4 capsule  3  . FLUoxetine (PROZAC) 20 MG capsule Take 1 capsule (20 mg total) by mouth every morning.  30 capsule  10  . furosemide (LASIX) 40 MG tablet Take 40 mg by mouth every morning.      . gabapentin (NEURONTIN) 800 MG tablet Take 800-1,600 mg by mouth 2 (two) times daily. 800 mg in the am and 1600 mg in the evening.      . insulin aspart (NOVOLOG) 100 UNIT/ML injection  Inject 8 Units into the skin 3 (three) times daily with meals.  10 mL  11  . Insulin Glargine (LANTUS SOLOSTAR) 100 UNIT/ML Solostar Pen Inject 25 Units into the skin daily at 10 pm.  15 mL  11  . isosorbide mononitrate (IMDUR) 60 MG 24 hr tablet Take 60 mg by mouth every morning.      Marland Kitchen losartan (COZAAR) 100 MG tablet Take 50 mg by mouth every morning.      . metoprolol succinate (TOPROL-XL) 25 MG 24 hr tablet Take 25 mg by mouth every morning.      Marland Kitchen oxyCODONE-acetaminophen (PERCOCET/ROXICET) 5-325 MG per tablet Take 1-2 tablets by mouth every 8 (eight) hours as needed for moderate pain or severe pain.  180 tablet  0  . promethazine (PHENERGAN) 25 MG tablet       . rosuvastatin (CRESTOR) 10 MG tablet Take 1 tablet (10 mg total) by mouth at bedtime.  30 tablet  11  . senna (SENOKOT) 8.6 MG TABS Take 4 tablets by mouth daily as needed (constipation).      . temazepam (RESTORIL) 15 MG capsule take 1 capsule by mouth at bedtime if needed for sleep  30 capsule  0   No current facility-administered medications for this visit.    Lab Results: No results found for this or any previous visit (from the past 48 hour(s)).  Physical Findings: AIMS:  , ,  ,  ,    CIWA:    COWS:     Treatment Plan Summary: At this time the patient will continue the higher dose of Wellbutrin 450 mg. The patient continue Prozac 20 mg. She would not be put on a sleeping aid. The patient will try to focus on her diabetes and will see her Victorino Dike brown in the near future. This patient is very stable. When she returns next time I will begin Krista Blue every 5-6 months.  Plan:  Medical Decision Making Problem Points:  Established problem, stable/improving (1) Data Points:  Review of medication regiment & side effects (2)  I certify that inpatient services furnished can reasonably be expected to improve the patient's condition.   Earvin Hansen Richie Bonanno 03/29/2014, 11:36 AM

## 2014-04-05 ENCOUNTER — Telehealth: Payer: Self-pay | Admitting: Dietician

## 2014-04-05 NOTE — Telephone Encounter (Signed)
Left message request patient to write down amount of Novolog takes each meal for 3 days and bring with her to appointment next Thursday.

## 2014-04-07 ENCOUNTER — Other Ambulatory Visit: Payer: Self-pay | Admitting: Internal Medicine

## 2014-04-10 ENCOUNTER — Ambulatory Visit (INDEPENDENT_AMBULATORY_CARE_PROVIDER_SITE_OTHER): Payer: PRIVATE HEALTH INSURANCE | Admitting: Dietician

## 2014-04-10 ENCOUNTER — Encounter: Payer: Self-pay | Admitting: Internal Medicine

## 2014-04-10 ENCOUNTER — Ambulatory Visit (INDEPENDENT_AMBULATORY_CARE_PROVIDER_SITE_OTHER): Payer: PRIVATE HEALTH INSURANCE | Admitting: Internal Medicine

## 2014-04-10 ENCOUNTER — Encounter: Payer: Self-pay | Admitting: Dietician

## 2014-04-10 VITALS — BP 142/67 | HR 84 | Temp 96.8°F | Wt 198.2 lb

## 2014-04-10 DIAGNOSIS — E119 Type 2 diabetes mellitus without complications: Secondary | ICD-10-CM

## 2014-04-10 DIAGNOSIS — IMO0001 Reserved for inherently not codable concepts without codable children: Secondary | ICD-10-CM

## 2014-04-10 DIAGNOSIS — E1165 Type 2 diabetes mellitus with hyperglycemia: Secondary | ICD-10-CM

## 2014-04-10 DIAGNOSIS — E785 Hyperlipidemia, unspecified: Secondary | ICD-10-CM

## 2014-04-10 DIAGNOSIS — IMO0002 Reserved for concepts with insufficient information to code with codable children: Secondary | ICD-10-CM

## 2014-04-10 DIAGNOSIS — Z794 Long term (current) use of insulin: Secondary | ICD-10-CM

## 2014-04-10 DIAGNOSIS — I10 Essential (primary) hypertension: Secondary | ICD-10-CM

## 2014-04-10 LAB — POCT GLYCOSYLATED HEMOGLOBIN (HGB A1C): Hemoglobin A1C: 9.2

## 2014-04-10 LAB — GLUCOSE, CAPILLARY: Glucose-Capillary: 376 mg/dL — ABNORMAL HIGH (ref 70–99)

## 2014-04-10 MED ORDER — INSULIN ASPART 100 UNIT/ML ~~LOC~~ SOLN: 9.0000 [IU] | Freq: Three times a day (TID) | SUBCUTANEOUS | Status: DC

## 2014-04-10 MED ORDER — INSULIN GLARGINE 100 UNIT/ML SOLOSTAR PEN: 30.0000 [IU] | PEN_INJECTOR | Freq: Every day | SUBCUTANEOUS | Status: DC

## 2014-04-10 NOTE — Assessment & Plan Note (Signed)
BP Readings from Last 3 Encounters:  04/10/14 142/67  03/20/14 128/74  03/08/14 130/81    Lab Results  Component Value Date   NA 137 03/08/2014   K 4.3 03/08/2014   CREATININE 1.83* 03/08/2014    Assessment: Blood pressure control: controlled Progress toward BP goal:  at goal  Plan: Medications:  continue current medications - toprol 25 daily (xr), lasix 40mg  daily, losartan 10mg  daily, imdur 60 mgdaily ,clonidine 0.2mg  patch daily (hot flashes) Educational resources provided: brochure Self management tools provided: home blood pressure logbook Other: CMET today

## 2014-04-10 NOTE — Progress Notes (Signed)
Patient left without being seen today.

## 2014-04-10 NOTE — Assessment & Plan Note (Signed)
No myalgias with crestor 10mg  qHS.

## 2014-04-10 NOTE — Progress Notes (Signed)
Subjective:   Patient ID: [name] female   DOB: [birth year] 63 y.o.   MRN: [id number]  HPI: [name] is a 63 y.o. woman with DM, CAD, GERD & HTN who presents for DM follow up. I last saw pt on 03/08/14  lantus 25 qHS novolog 8 &13 depending on cbg >250. No hypoglycemia.  Dr. Matthias HughsBuccini tomorrow, Dr. Derrell Lollingamirez next week. Pain Center next week. Mammo next week.  New therapist in Aug. Foot exam with podiatry last week!!  Review of Systems: Constitutional: Denies fever, chills, diaphoresis, appetite change and fatigue.  HEENT: Denies photophobia, eye pain, redness, hearing loss, ear pain, congestion, sore throat, rhinorrhea, sneezing, mouth sores, trouble swallowing, neck pain, neck stiffness and tinnitus.  Respiratory: Denies SOB, DOE, cough, chest tightness, and wheezing.  Cardiovascular: Denies chest pain, palpitations. Leg swelling if standing for a long time  Gastrointestinal: Denies vomiting, abdominal pain, diarrhea, constipation,blood in stool and abdominal distention. Daily BMs. +occ nausea   Genitourinary: Denies dysuria, urgency, frequency, hematuria, flank pain and difficulty urinating. Musculoskeletal: No Myalgias with crestor, +chronic back pain  Skin: Denies pallor, rash and wound.  Neurological: Denies seizures, syncope, weakness, numbness and headaches.  Hematological: No s/s of bleeding    Past Medical History  Diagnosis Date  . Hypertension   . Peripheral neuropathy   . Cervicalgia   . Low back pain syndrome   . Hyperlipidemia   . Vitamin D deficiency   . Vitamin B12 deficiency   . Iliotibial band syndrome   . Anemia   . Borderline personality disorder   . Nonorganic psychosis   . GERD (gastroesophageal reflux disease)   . Diabetes mellitus   . Depression   . Diabetic gastroparesis   . CAD (coronary artery disease)     Catherization 11/18/09>nonobstructive CAD , normal LV function, EF 65%  . Health maintenance examination     Colonoscopy 2009>  normal; Diabetic eye exam 12/2008. No diabetic retinopathy; Mammogram 11/10> No evidence of malignancy, DXA 03/14/13 : normal  . Headache(784.0)   . Arthritis   . SBO (small bowel obstruction) 01/09/2013  . Diastolic CHF     Grade I, on Echo 06/2013, EF 55-60%  . Sleep apnea     does not wear CPAP    Current Outpatient Prescriptions  Medication Sig Dispense Refill  . ACCU-CHEK FASTCLIX LANCETS MISC       . ACCU-CHEK SMARTVIEW test strip       . aspirin 81 MG chewable tablet Chew 81 mg by mouth daily.      Marland Kitchen. buPROPion (WELLBUTRIN XL) 150 MG 24 hr tablet Take 3 tablets (450 mg total) by mouth every morning.  90 tablet  5  . cloNIDine (CATAPRES - DOSED IN MG/24 HR) 0.2 mg/24hr patch PLACE 1 PATCH ONTO THE SKIN EVERY 7 DAYS  4 patch  3  . dexlansoprazole (DEXILANT) 60 MG capsule Take 60 mg by mouth daily.        Tery Sanfilippo. Docusate Sodium (COLACE PO) Take 2 tablets by mouth daily as needed (constipation).      Marland Kitchen. ergocalciferol (VITAMIN D2) 50000 UNITS capsule Take 1 capsule (50,000 Units total) by mouth once a week.  4 capsule  3  . FLUoxetine (PROZAC) 20 MG capsule Take 1 capsule (20 mg total) by mouth every morning.  30 capsule  10  . furosemide (LASIX) 40 MG tablet Take 40 mg by mouth every morning.      . gabapentin (NEURONTIN) 800 MG tablet Take 800-1,600 mg by  mouth 2 (two) times daily. 800 mg in the am and 1600 mg in the evening.      . insulin aspart (NOVOLOG) 100 UNIT/ML injection Inject 8 Units into the skin 3 (three) times daily with meals.  10 mL  11  . Insulin Glargine (LANTUS SOLOSTAR) 100 UNIT/ML Solostar Pen Inject 25 Units into the skin daily at 10 pm.  15 mL  11  . isosorbide mononitrate (IMDUR) 60 MG 24 hr tablet Take 60 mg by mouth every morning.      Marland Kitchen. losartan (COZAAR) 100 MG tablet Take 50 mg by mouth every morning.      . metoprolol succinate (TOPROL-XL) 25 MG 24 hr tablet Take 25 mg by mouth every morning.      Marland Kitchen. oxyCODONE-acetaminophen (PERCOCET/ROXICET) 5-325 MG per tablet Take  1-2 tablets by mouth every 8 (eight) hours as needed for moderate pain or severe pain.  180 tablet  0  . promethazine (PHENERGAN) 25 MG tablet       . rosuvastatin (CRESTOR) 10 MG tablet Take 1 tablet (10 mg total) by mouth at bedtime.  30 tablet  11  . senna (SENOKOT) 8.6 MG TABS Take 4 tablets by mouth daily as needed (constipation).      . temazepam (RESTORIL) 15 MG capsule take 1 capsule by mouth at bedtime if needed for sleep  30 capsule  0   No current facility-administered medications for this visit.   Family History  Problem Relation Age of Onset  . Diabetes Mother   . Hypertension Mother   . Hyperlipidemia Mother   . Arthritis Mother   . Depression Mother   . Heart disease Father   . Diabetes Sister   . Diabetes Brother   . Heart disease Brother   . Heart disease Paternal Grandmother   . Seizures Brother    History   Social History  . Marital Status: Married    Spouse Name: N/A    Number of Children: N/A  . Years of Education: N/A   Social History Main Topics  . Smoking status: Former Smoker    Types: Cigarettes    Quit date: 10/25/2002  . Smokeless tobacco: Never Used  . Alcohol Use: No  . Drug Use: No  . Sexual Activity: Yes   Other Topics Concern  . Not on file   Social History Narrative  . No narrative on file    Objective:  Physical Exam: Filed Vitals:   04/10/14 1358  BP: 142/67  Pulse: 84  Temp: 96.8 F (36 C)  TempSrc: Oral  Weight: 198 lb 3.2 oz (89.903 kg)  SpO2: 98%   General: pleasant, appears happy today,great grandson with her HEENT: PERRL, EOMI, no scleral icterus Cardiac: RRR, no rubs, murmurs or gallops Pulm: clear to auscultation bilaterally, moving normal volumes of air Abd: soft, nontender, nondistended, BS present Ext: warm and well perfused, trace pretibial edema b/l Neuro: alert and oriented X3, cranial nerves II-XII grossly intact  Assessment & Plan:  Case and care discussed with Dr. Rogelia BogaButcher.  Please see problem  oriented charting for further details. Patient to return in 3 months for routine.

## 2014-04-10 NOTE — Patient Instructions (Signed)
General Instructions: -Your diabetes is much improved!!  Please increase lantus to 30units before bed, and increase mealtime insulin to 9units 20 minutes before each meal.  You may take 14 units still if your blood sugar is more than 250.  -No other changes to your medications!  Thank you for bringing your medicines today. This helps us keep you safe from mistakes.  Please be sure to bring all of your medications with you to every visit.  Should you have any new or worsening symptoms, please be sure to call the clinic at [phone].   Progress Toward Treatment Goals:  Treatment Goal 04/10/2014  Hemoglobin A1C improved  Blood pressure at goal    Self Care Goals & Plans:  Self Care Goal 04/10/2014  Manage my medications take my medicines as prescribed; bring my medications to every visit  Monitor my health bring my glucose meter and log to each visit; keep track of my blood glucose  Eat healthy foods eat baked foods instead of fried foods; eat foods that are low in salt  Be physically active find an activity I enjoy  Meeting treatment goals -    Home Blood Glucose Monitoring 01/02/2014  Check my blood sugar 3 times a day  When to check my blood sugar before breakfast; at bedtime; before lunch     Care Management & Community Referrals:  Referral 11/15/2013  Referrals made for care management support diabetes educator  Referrals made to community resources weight management; exercise/physical therapy; nutrition

## 2014-04-10 NOTE — Assessment & Plan Note (Signed)
Lab Results  Component Value Date   HGBA1C 9.2 04/10/2014   HGBA1C 10.4 01/02/2014   HGBA1C 13.4 09/12/2013     Assessment: Diabetes control: poor control (HgbA1C >9%) Progress toward A1C goal:  improved  Plan: Medications:  increase lantus to 30u qHS and novolog to 9u TIDWC (may take 13u if CBG>250) Instruction/counseling given: reminded to bring blood glucose meter & log to each visit, reminded to bring medications to each visit and discussed diet Educational resources provided: brochure Self management tools provided: copy of home glucose meter download

## 2014-04-10 NOTE — Progress Notes (Signed)
Case discussed with Dr. Sharda soon after the resident saw the patient.  We reviewed the resident's history and exam and pertinent patient test results.  I agree with the assessment, diagnosis, and plan of care documented in the resident's note. 

## 2014-04-11 ENCOUNTER — Ambulatory Visit (INDEPENDENT_AMBULATORY_CARE_PROVIDER_SITE_OTHER): Payer: PRIVATE HEALTH INSURANCE | Admitting: Dietician

## 2014-04-11 ENCOUNTER — Encounter: Payer: Self-pay | Admitting: Dietician

## 2014-04-11 DIAGNOSIS — IMO0001 Reserved for inherently not codable concepts without codable children: Secondary | ICD-10-CM

## 2014-04-11 DIAGNOSIS — E1165 Type 2 diabetes mellitus with hyperglycemia: Secondary | ICD-10-CM

## 2014-04-11 DIAGNOSIS — IMO0002 Reserved for concepts with insufficient information to code with codable children: Secondary | ICD-10-CM

## 2014-04-11 DIAGNOSIS — Z794 Long term (current) use of insulin: Secondary | ICD-10-CM

## 2014-04-11 LAB — COMPREHENSIVE METABOLIC PANEL
ALT: 14 U/L (ref 0–35)
AST: 15 U/L (ref 0–37)
Albumin: 4.3 g/dL (ref 3.5–5.2)
Alkaline Phosphatase: 102 U/L (ref 39–117)
BUN: 31 mg/dL — ABNORMAL HIGH (ref 6–23)
CALCIUM: 9.8 mg/dL (ref 8.4–10.5)
CHLORIDE: 101 meq/L (ref 96–112)
CO2: 22 meq/L (ref 19–32)
Creat: 1.61 mg/dL — ABNORMAL HIGH (ref 0.50–1.10)
Glucose, Bld: 324 mg/dL — ABNORMAL HIGH (ref 70–99)
Potassium: 5.2 mEq/L (ref 3.5–5.3)
SODIUM: 137 meq/L (ref 135–145)
TOTAL PROTEIN: 7.8 g/dL (ref 6.0–8.3)
Total Bilirubin: 0.3 mg/dL (ref 0.2–1.2)

## 2014-04-11 NOTE — Patient Instructions (Signed)
For the next week: please record the number of carbs you eat with what and how much to practice carb gram counting.   Make a follow up to learn to use the Accu expert meter when you are ready to use it.        Vitamin D Deficiency  Not having enough vitamin D is called a deficiency. Your body needs this vitamin to keep your bones strong and healthy. Having too little of it can make your bones soft or can cause other health problems.  HOME CARE  Take all vitamins, herbs, or nutrition drinks (supplements) as told by your doctor.  Have your blood tested 2 months after taking vitamins, herbs, or nutrition drinks.  Eat foods that have vitamin D. This includes:  Dairy products, cereals, or juices with added vitamin D. Check the label.  Fatty fish like salmon or trout.  Eggs.  Oysters.  Do not use tanning beds.  Stay at a healthy weight. Lose weight if needed.  Keep all doctor visits as told. GET HELP IF:  You have questions.  You continue to have problems.  You feel sick to your stomach (nauseous) or throw up (vomit).  You cannot go poop (constipated).  You feel confused.  You have severe belly (abdominal) or back pain. MAKE SURE YOU:  Understand these instructions.  Will watch your condition.  Will get help right away if you are not doing well or get worse.  SUNLIGHT for vitmamin D- 30 minutes with arms and face exposed should be fine in the summer.    Iron-Rich Diet An iron-rich diet contains foods that are good sources of iron. Iron is an important mineral that helps your body produce hemoglobin. Hemoglobin is a protein in red blood cells that carries oxygen to the body's tissues. Sometimes, the iron level in your blood can be low. This may be caused by:  A lack of iron in your diet.  Blood loss.  Times of growth, such as during pregnancy or during a child's growth and development. Low levels of iron can cause a decrease in the number of red blood cells.  This can result in iron deficiency anemia. Iron deficiency anemia symptoms include:  Tiredness.  Weakness.  Irritability.  Increased chance of infection. Here are some recommendations for daily iron intake:  Males older than 64 years of age need 8 mg of iron per day.  Women ages 119 to 5950 need 18 mg of iron per day.  Pregnant women need 27 mg of iron per day, and women who are over 64 years of age and breastfeeding need 9 mg of iron per day.  Women over the age of 64 need 8 mg of iron per day. SOURCES OF IRON There are 2 types of iron that are found in food: heme iron and nonheme iron. Heme iron is absorbed by the body better than nonheme iron. Heme iron is found in meat, poultry, and fish. Nonheme iron is found in grains, beans, and vegetables. Heme Iron Sources Food / Iron (mg)  Chicken liver, 3 oz (85 g)/ 10 mg  Beef liver, 3 oz (85 g)/ 5.5 mg  Oysters, 3 oz (85 g)/ 8 mg  Beef, 3 oz (85 g)/ 2 to 3 mg  Shrimp, 3 oz (85 g)/ 2.8 mg  [place], 3 oz (85 g)/ 2 mg  Chicken, 3 oz (85 g) / 1 mg  Fish (tuna, halibut), 3 oz (85 g)/ 1 mg  Pork, 3 oz (85 g)/ 0.9  mg Nonheme Iron Sources Food / Iron (mg)  Ready-to-eat breakfast cereal, iron-fortified / 3.9 to 7 mg  Tofu,  cup / 3.4 mg  Kidney beans,  cup / 2.6 mg  Baked potato with skin / 2.7 mg  Asparagus,  cup / 2.2 mg  Avocado / 2 mg  Dried peaches,  cup / 1.6 mg  Raisins,  cup / 1.5 mg  Soy milk, 1 cup / 1.5 mg  Whole-wheat bread, 1 slice / 1.2 mg  Spinach, 1 cup / 0.8 mg  Broccoli,  cup / 0.6 mg IRON ABSORPTION Certain foods can decrease the body's absorption of iron. Try to avoid these foods and beverages while eating meals with iron-containing foods:  Coffee.  Tea.  Fiber.  Soy. Foods containing vitamin C can help increase the amount of iron your body absorbs from iron sources, especially from nonheme sources. Eat foods with vitamin C along with iron-containing foods to increase your iron  absorption. Foods that are high in vitamin C include many fruits and vegetables. Some good sources are:  Fresh orange juice.  Oranges.  Strawberries.  Mangoes.  Grapefruit.  Red bell peppers.  Green bell peppers.  Broccoli.  Potatoes with skin.  Tomato juice. Document Released: 05/25/2005 Document Revised: 01/03/2012 Document Reviewed: 04/01/2011 [facility] Patient Information 2015 [employer], [place]. This information is not intended to replace advice given to you by your health care provider. Make sure you discuss any questions you have with your health care provider.

## 2014-04-11 NOTE — Progress Notes (Signed)
Medical Nutrition Therapy:  Appt start time: 1030 end time:  1130.  Assessment:  Primary concerns today: Blood sugar control.  Patient's main concern is having her blood sugars under better control and more freedom in food choice.  Demonstrated good ability to read labels for carbohydrate content of food. No problems identifying foods that contiain carbs. She counted carbs without problem for a meal demonstrating good math skills. Reviewed blood sugars from recent hospitalization that demonstrate controlled blood sugars on no diabetes medicine in the fasting state indicating endogenous insulin production. Good mood today and optimistic about using Accu chek Aviva Expert meter for bolus advice.  Usual eating pattern includes 1-3 meals and 1-3 snacks per day with variability in timing and amounts. Usual physical activity was not addressed today other than she;'d like to get back to exercising regularly in the future. Currently caring for grandchild. Labs/ CBGs/logbook reviewed- shows variability in CBGs,  CBG high today fasting likely from no lantus yesterday, A1C 9.2%, Cr 1.6 INSULIN:  Current basal is 30 units, current bolus average is  ~ 32 units for a TDD of 62 units(0.69 units/kg), CIR: 1 unit to 8 grams carbs, Correction factor: 1 unit lowers CBG ~ 29-30 mg/dl   Progress Towards Goal(s):  Some progress.   Nutritional Diagnosis:  [place]shaw-2.3 Food-medication interaction As related to lack of coordiantion between food intake and insulin dosages  As evidenced by her fluctuating blood sugars and frustration with being restricted on what and when she eats to have to control her blood sugars.    Intervention:  Nutrition education about Aviva expert meter and carb counting. Patient to practice counting carbs for next week. Will schedule follow up to learn how to use new meter for bolus advice. CDE to set up meter parameters per physician orders.   Monitoring/Evaluation:  Dietary intake, exercise, blood  sugars, and body weight in 4 week(s).

## 2014-04-12 ENCOUNTER — Other Ambulatory Visit: Payer: Self-pay | Admitting: Internal Medicine

## 2014-04-12 DIAGNOSIS — IMO0002 Reserved for concepts with insufficient information to code with codable children: Secondary | ICD-10-CM

## 2014-04-12 DIAGNOSIS — E1165 Type 2 diabetes mellitus with hyperglycemia: Secondary | ICD-10-CM

## 2014-04-12 DIAGNOSIS — Z794 Long term (current) use of insulin: Principal | ICD-10-CM

## 2014-04-15 ENCOUNTER — Other Ambulatory Visit: Payer: Self-pay | Admitting: Internal Medicine

## 2014-04-15 ENCOUNTER — Ambulatory Visit (HOSPITAL_BASED_OUTPATIENT_CLINIC_OR_DEPARTMENT_OTHER): Payer: PRIVATE HEALTH INSURANCE | Admitting: Physical Medicine & Rehabilitation

## 2014-04-15 ENCOUNTER — Encounter: Payer: PRIVATE HEALTH INSURANCE | Attending: Physical Medicine & Rehabilitation

## 2014-04-15 ENCOUNTER — Ambulatory Visit (HOSPITAL_COMMUNITY)
Admission: RE | Admit: 2014-04-15 | Discharge: 2014-04-15 | Disposition: A | Discharge status: Home / Self Care | Payer: PRIVATE HEALTH INSURANCE | Source: Ambulatory Visit | Attending: Internal Medicine | Admitting: Internal Medicine

## 2014-04-15 ENCOUNTER — Encounter: Payer: Self-pay | Admitting: Physical Medicine & Rehabilitation

## 2014-04-15 VITALS — BP 125/51 | HR 87 | Resp 16 | Ht 65.0 in | Wt 198.0 lb

## 2014-04-15 DIAGNOSIS — Z1231 Encounter for screening mammogram for malignant neoplasm of breast: Secondary | ICD-10-CM | POA: Insufficient documentation

## 2014-04-15 DIAGNOSIS — M545 Low back pain, unspecified: Secondary | ICD-10-CM

## 2014-04-15 DIAGNOSIS — E785 Hyperlipidemia, unspecified: Secondary | ICD-10-CM | POA: Insufficient documentation

## 2014-04-15 DIAGNOSIS — Z79899 Other long term (current) drug therapy: Secondary | ICD-10-CM | POA: Insufficient documentation

## 2014-04-15 DIAGNOSIS — I10 Essential (primary) hypertension: Secondary | ICD-10-CM | POA: Insufficient documentation

## 2014-04-15 DIAGNOSIS — G609 Hereditary and idiopathic neuropathy, unspecified: Secondary | ICD-10-CM

## 2014-04-15 DIAGNOSIS — E1149 Type 2 diabetes mellitus with other diabetic neurological complication: Secondary | ICD-10-CM | POA: Insufficient documentation

## 2014-04-15 DIAGNOSIS — F3289 Other specified depressive episodes: Secondary | ICD-10-CM | POA: Insufficient documentation

## 2014-04-15 DIAGNOSIS — G4730 Sleep apnea, unspecified: Secondary | ICD-10-CM | POA: Insufficient documentation

## 2014-04-15 DIAGNOSIS — G8929 Other chronic pain: Secondary | ICD-10-CM | POA: Insufficient documentation

## 2014-04-15 DIAGNOSIS — Z87891 Personal history of nicotine dependence: Secondary | ICD-10-CM | POA: Insufficient documentation

## 2014-04-15 DIAGNOSIS — E1142 Type 2 diabetes mellitus with diabetic polyneuropathy: Secondary | ICD-10-CM | POA: Insufficient documentation

## 2014-04-15 DIAGNOSIS — I2510 Atherosclerotic heart disease of native coronary artery without angina pectoris: Secondary | ICD-10-CM | POA: Insufficient documentation

## 2014-04-15 DIAGNOSIS — K219 Gastro-esophageal reflux disease without esophagitis: Secondary | ICD-10-CM | POA: Insufficient documentation

## 2014-04-15 DIAGNOSIS — F329 Major depressive disorder, single episode, unspecified: Secondary | ICD-10-CM | POA: Insufficient documentation

## 2014-04-15 DIAGNOSIS — M47817 Spondylosis without myelopathy or radiculopathy, lumbosacral region: Secondary | ICD-10-CM | POA: Insufficient documentation

## 2014-04-15 DIAGNOSIS — Z5181 Encounter for therapeutic drug level monitoring: Secondary | ICD-10-CM

## 2014-04-15 NOTE — Patient Instructions (Signed)
Will do urine drug screen today  Continue oxycodone  Assuming the urine drug screen looks fine, we will try Tylenol #3 when you are out of oxycodone  Next visit will be for medial branch blocks. This is a way of blocking pain from arthritic joints in your low back

## 2014-04-15 NOTE — Progress Notes (Signed)
Subjective:    Patient ID: [name], female    DOB: 11-02-1950, 63 y.o.   MRN: [id number]  HPI 63 year old female who is been seen in this clinic between 2005 to 2008 for axial back pain. Was treated with tramadol 100 mg 4 times a day, amitriptyline 50 mg at night, no injections were performed at that time. Patient was lost medical followup at this clinic for a number of years. She now returns saying that her back pain has increased again.  Interval history positive for coronary artery disease, diabetes with neuropathy. Now is on gabapentin.  Has been recently restarted on tramadol 50 mg 3 times a day which does not seem to be very helpful.   Now on Oxycodone 5/325 BID, helps with activity related pain for ~4 hour No pain that wakes her at night  Interval medical history, incisional hernia repair May 8,2015.  Not fully healed, last visit with surgeon this thurs Diabetes is now well controlled.  Back on cholesterol medication, no side effects  Doing well from depression standpoint sees psychiatry for this.  Patient needed to cancel medial branch blocks L3 L4-L5 scheduled with me in December of 2014 secondary to out of controlled diabetes at that time.  Pain Inventory Average Pain 10 Pain Right Now 3 My pain is n/a  In the last 24 hours, has pain interfered with the following? General activity 7 Relation with others 4 Enjoyment of life 8 What TIME of day is your pain at its worst? n/a Sleep (in general) Good  Pain is worse with: walking, standing and some activites Pain improves with: rest and medication Relief from Meds: 5  Mobility how many minutes can you walk? 20 ability to climb steps?  yes do you drive?  yes transfers alone Do you have any goals in this area?  yes  Function not employed: date last employed .  Neuro/Psych numbness depression  Prior Studies x-rays  Physicians involved in your care Primary care . Psychiatrist .   Family History    Problem Relation Age of Onset  . Diabetes Mother   . Hypertension Mother   . Hyperlipidemia Mother   . Arthritis Mother   . Depression Mother   . Heart disease Father   . Diabetes Sister   . Diabetes Brother   . Heart disease Brother   . Heart disease Paternal Grandmother   . Seizures Brother    History   Social History  . Marital Status: Married    Spouse Name: N/A    Number of Children: N/A  . Years of Education: N/A   Social History Main Topics  . Smoking status: Former Smoker    Types: Cigarettes    Quit date: 10/25/2002  . Smokeless tobacco: Never Used  . Alcohol Use: No  . Drug Use: No  . Sexual Activity: Yes   Other Topics Concern  . None   Social History Narrative  . None   Past Surgical History  Procedure Laterality Date  . Abdominal hysterectomy    . Hand surgery    . Bowel resection N/A 01/10/2013    Procedure: SMALL BOWEL RESECTION;  Surgeon: Lodema PilotBrian Layton, DO;  Location: [facility] OR;  Service: General;  Laterality: N/A;  . Lysis of adhesion N/A 01/10/2013    Procedure: LYSIS OF ADHESION;  Surgeon: Lodema PilotBrian Layton, DO;  Location: [facility] OR;  Service: General;  Laterality: N/A;  . Laparotomy N/A 01/10/2013    Procedure: EXPLORATORY LAPAROTOMY;  Surgeon: Lodema PilotBrian Layton, DO;  Location: [facility] OR;  Service: General;  Laterality: N/A;  . Incisional hernia repair N/A 03/01/2014    Procedure: LAPAROSCOPIC INCISIONAL HERNIA;  Surgeon: Axel Filler, MD;  Location: [facility] ORS;  Service: General;  Laterality: N/A;  . Insertion of mesh N/A 03/01/2014    Procedure: INSERTION OF MESH;  Surgeon: Axel Filler, MD;  Location: [facility] ORS;  Service: General;  Laterality: N/A;  . Hernia repair  03/01/14    Lap incisional hernia repair w/mesh   Past Medical History  Diagnosis Date  . Hypertension   . Peripheral neuropathy   . Cervicalgia   . Low back pain syndrome   . Hyperlipidemia   . Vitamin D deficiency   . Vitamin B12 deficiency   . Iliotibial band syndrome   . Anemia   . Borderline  personality disorder   . Nonorganic psychosis   . GERD (gastroesophageal reflux disease)   . Diabetes mellitus   . Depression   . Diabetic gastroparesis   . CAD (coronary artery disease)     Catherization 11/18/09>nonobstructive CAD , normal LV function, EF 65%  . Health maintenance examination     Colonoscopy 2009> normal; Diabetic eye exam 12/2008. No diabetic retinopathy; Mammogram 11/10> No evidence of malignancy, DXA 03/14/13 : normal  . Headache(784.0)   . Arthritis   . SBO (small bowel obstruction) 01/09/2013  . Diastolic CHF     Grade I, on Echo 06/2013, EF 55-60%  . Sleep apnea     does not wear CPAP    BP 125/51  Pulse 87  Resp 16  Ht 5\' 5"  (1.651 m)  Wt 198 lb (89.812 kg)  BMI 32.95 kg/m2  SpO2 97%  Opioid Risk Score:  3 Fall Risk Score: Moderate Fall Risk (6-13 points) (patient educated handout declined)   Review of Systems  Constitutional: Positive for diaphoresis.  Cardiovascular: Positive for leg swelling.  Gastrointestinal: Positive for nausea, abdominal pain and constipation.  Neurological: Positive for numbness.  Psychiatric/Behavioral: Positive for dysphoric mood.  All other systems reviewed and are negative.      Objective:   Physical Exam  Nursing note and vitals reviewed. Constitutional: She is oriented to person, place, and time. She appears well-developed and well-nourished.  HENT:  Head: Normocephalic and atraumatic.  Right Ear: External ear normal.  Left Ear: External ear normal.  Eyes: Conjunctivae and EOM are normal. Pupils are equal, round, and reactive to light.  Neck: Normal range of motion.  Cardiovascular: Normal rate, regular rhythm and normal heart sounds.   Pulmonary/Chest: Effort normal and breath sounds normal.  Abdominal: Soft.  Musculoskeletal:       Cervical back: She exhibits normal range of motion and no tenderness.       Lumbar back: She exhibits decreased range of motion, tenderness and pain. She exhibits no deformity and  no spasm.  Neurological: She is alert and oriented to person, place, and time. She has normal strength. A sensory deficit is present. She exhibits normal muscle tone. Gait normal.  Reflex Scores:      Tricep reflexes are 0 on the right side and 0 on the left side.      Bicep reflexes are 0 on the right side and 0 on the left side.      Brachioradialis reflexes are 0 on the right side and 0 on the left side.      Patellar reflexes are 0 on the right side and 0 on the left side.      Achilles reflexes  are 0 on the right side and 0 on the left side. Decreased sensation in bilateral feet to the lower leg  Decreased sensation in fingers to the mid hand  Psychiatric: She has a normal mood and affect.          Assessment & Plan:  1. Lumbar spondylosis will eval/tx with Bilateral L3-4-5 MBB next visit- cont oxycodone 5 mg/325 one per os twice a day when necessary for activity related pain. Patient is greater than one month supply at this point. Will check urine drug screen. Given that she is on a relatively low dose of oxycodone she may actually do well with Tylenol #3, 1 tablet 3 times per day as needed. This would allow her to be seen on a less frequent basis since it is not a schedule II medication 2. Diabetic neuropathy with pain and numbness, sleep disturbance- gabapentin, diabetic control, has yearly podiatry eval Will do medial branch blocks without steroid

## 2014-04-16 NOTE — Addendum Note (Signed)
Addended by: Judd GaudierLEVENS, SHANNON M on: 04/16/2014 09:12 AM   Modules accepted: Orders

## 2014-04-18 ENCOUNTER — Ambulatory Visit (INDEPENDENT_AMBULATORY_CARE_PROVIDER_SITE_OTHER): Payer: Medicare Other | Admitting: General Surgery

## 2014-04-18 ENCOUNTER — Encounter (INDEPENDENT_AMBULATORY_CARE_PROVIDER_SITE_OTHER): Payer: Self-pay | Admitting: General Surgery

## 2014-04-18 VITALS — BP 138/70 | HR 72 | Resp 16 | Ht 65.0 in | Wt 196.2 lb

## 2014-04-18 DIAGNOSIS — Z9889 Other specified postprocedural states: Secondary | ICD-10-CM

## 2014-04-18 NOTE — Progress Notes (Signed)
Patient ID: [name], female   DOB: [birth year], 63 y.o.   MRN: [id number] Post op course The patient is a 63 year old female status post incisional hernia repair with Mesh. Patient has been good we'll switch her last clinic visit. She's had less pain. She started to resume normal activities.  On Exam: wounds clean dry and intact, and there is no hernia on palpation   Assessment and Plan 63 year old female status post laparoscopic incisional hernia repair with Mesh 1. The patient has no lifting restrictions. 2. Patient follow up as needed   Axel FillerArmando Ramirez, MD [facility] Trauma & Emergency Surgery

## 2014-05-03 ENCOUNTER — Other Ambulatory Visit: Payer: Self-pay | Admitting: Internal Medicine

## 2014-05-06 ENCOUNTER — Other Ambulatory Visit: Payer: Self-pay | Admitting: *Deleted

## 2014-05-06 NOTE — Telephone Encounter (Signed)
Error

## 2014-05-14 ENCOUNTER — Encounter: Payer: Self-pay | Admitting: Registered Nurse

## 2014-05-14 ENCOUNTER — Encounter: Payer: PRIVATE HEALTH INSURANCE | Attending: Physical Medicine & Rehabilitation | Admitting: Registered Nurse

## 2014-05-14 VITALS — BP 145/69 | HR 88 | Resp 16 | Ht 65.0 in | Wt 197.0 lb

## 2014-05-14 DIAGNOSIS — F3289 Other specified depressive episodes: Secondary | ICD-10-CM | POA: Diagnosis not present

## 2014-05-14 DIAGNOSIS — E1149 Type 2 diabetes mellitus with other diabetic neurological complication: Secondary | ICD-10-CM | POA: Diagnosis not present

## 2014-05-14 DIAGNOSIS — Z87891 Personal history of nicotine dependence: Secondary | ICD-10-CM | POA: Insufficient documentation

## 2014-05-14 DIAGNOSIS — K219 Gastro-esophageal reflux disease without esophagitis: Secondary | ICD-10-CM | POA: Diagnosis not present

## 2014-05-14 DIAGNOSIS — M545 Low back pain, unspecified: Secondary | ICD-10-CM

## 2014-05-14 DIAGNOSIS — G609 Hereditary and idiopathic neuropathy, unspecified: Secondary | ICD-10-CM

## 2014-05-14 DIAGNOSIS — M47817 Spondylosis without myelopathy or radiculopathy, lumbosacral region: Secondary | ICD-10-CM | POA: Diagnosis not present

## 2014-05-14 DIAGNOSIS — Z79899 Other long term (current) drug therapy: Secondary | ICD-10-CM | POA: Insufficient documentation

## 2014-05-14 DIAGNOSIS — G4730 Sleep apnea, unspecified: Secondary | ICD-10-CM | POA: Diagnosis not present

## 2014-05-14 DIAGNOSIS — F329 Major depressive disorder, single episode, unspecified: Secondary | ICD-10-CM | POA: Insufficient documentation

## 2014-05-14 DIAGNOSIS — Z5181 Encounter for therapeutic drug level monitoring: Secondary | ICD-10-CM

## 2014-05-14 DIAGNOSIS — E1142 Type 2 diabetes mellitus with diabetic polyneuropathy: Secondary | ICD-10-CM | POA: Diagnosis not present

## 2014-05-14 DIAGNOSIS — I2510 Atherosclerotic heart disease of native coronary artery without angina pectoris: Secondary | ICD-10-CM | POA: Insufficient documentation

## 2014-05-14 DIAGNOSIS — E785 Hyperlipidemia, unspecified: Secondary | ICD-10-CM | POA: Diagnosis not present

## 2014-05-14 DIAGNOSIS — I10 Essential (primary) hypertension: Secondary | ICD-10-CM | POA: Insufficient documentation

## 2014-05-14 DIAGNOSIS — G8929 Other chronic pain: Secondary | ICD-10-CM

## 2014-05-14 MED ORDER — ACETAMINOPHEN-CODEINE #3 300-30 MG PO TABS: 1.0000 | ORAL_TABLET | Freq: Three times a day (TID) | ORAL | Status: DC | PRN

## 2014-05-14 NOTE — Progress Notes (Signed)
Subjective:    Patient ID: [name] Maeslice Gent, female    DOB: [birth year], 63 y.o.   MRN: [id number]  HPI: [name] is a 63 year old female who returns for follow up for chronic pain and medication refill. She says her pain is located in her lower back. She rates her pain 3. Her current exercise regime is walking. She has been encouraged to increase her activity level slowly. She verbalized understanding. She is schedule for MBB with Dr. Wynn BankerKirsteins on on 06/11/2014. She was receiving her pain medications from Dr. Everardo BealsSharda. She states the oxycodone not offering much relief. She will be started on Tylenol #3 one tablet three times a day. Her oxycodone has been discarded. The Narcotic contract was given and she verbalizes understanding. [place] Controlled Substance Reporting System was checked. Last oxycodone script was written on 03/08/2014 .   Pain Inventory Average Pain 8 Pain Right Now 3 My pain is pulling  In the last 24 hours, has pain interfered with the following? General activity 7 Relation with others 6 Enjoyment of life 7 What TIME of day is your pain at its worst? n/a Sleep (in general) Good  Pain is worse with: walking, sitting, standing and some activites Pain improves with: rest and medication Relief from Meds: 2  Mobility walk without assistance how many minutes can you walk? 10 ability to climb steps?  yes do you drive?  yes transfers alone Do you have any goals in this area?  yes  Function disabled: date disabled 1999  Neuro/Psych numbness tingling spasms depression anxiety  Prior Studies Any changes since last visit?  no  Physicians involved in your care Primary care . Psychiatrist .   Family History  Problem Relation Age of Onset  . Diabetes Mother   . Hypertension Mother   . Hyperlipidemia Mother   . Arthritis Mother   . Depression Mother   . Heart disease Father   . Diabetes Sister   . Diabetes Brother   . Heart disease Brother   .  Heart disease Paternal Grandmother   . Seizures Brother    History   Social History  . Marital Status: Married    Spouse Name: N/A    Number of Children: N/A  . Years of Education: N/A   Social History Main Topics  . Smoking status: Former Smoker    Types: Cigarettes    Quit date: 10/25/2002  . Smokeless tobacco: Never Used  . Alcohol Use: No  . Drug Use: No  . Sexual Activity: Yes   Other Topics Concern  . None   Social History Narrative  . None   Past Surgical History  Procedure Laterality Date  . Abdominal hysterectomy    . Hand surgery    . Bowel resection N/A 01/10/2013    Procedure: SMALL BOWEL RESECTION;  Surgeon: Lodema PilotBrian Layton, DO;  Location: [facility] OR;  Service: General;  Laterality: N/A;  . Lysis of adhesion N/A 01/10/2013    Procedure: LYSIS OF ADHESION;  Surgeon: Lodema PilotBrian Layton, DO;  Location: [facility] OR;  Service: General;  Laterality: N/A;  . Laparotomy N/A 01/10/2013    Procedure: EXPLORATORY LAPAROTOMY;  Surgeon: Lodema PilotBrian Layton, DO;  Location: [facility] OR;  Service: General;  Laterality: N/A;  . Incisional hernia repair N/A 03/01/2014    Procedure: LAPAROSCOPIC INCISIONAL HERNIA;  Surgeon: Axel FillerArmando Ramirez, MD;  Location: [facility] ORS;  Service: General;  Laterality: N/A;  . Insertion of mesh N/A 03/01/2014    Procedure: INSERTION OF MESH;  Surgeon: Axel Filler, MD;  Location: [facility]S;  Service: General;  Laterality: N/A;  . Hernia repair  03/01/14    Lap incisional hernia repair w/mesh   Past Medical History  Diagnosis Date  . Hypertension   . Peripheral neuropathy   . Cervicalgia   . Low back pain syndrome   . Hyperlipidemia   . Vitamin D deficiency   . Vitamin B12 deficiency   . Iliotibial band syndrome   . Anemia   . Borderline personality disorder   . Nonorganic psychosis   . GERD (gastroesophageal reflux disease)   . Diabetes mellitus   . Depression   . Diabetic gastroparesis   . CAD (coronary artery disease)     Catherization 11/18/09>nonobstructive CAD , normal LV  function, EF 65%  . Health maintenance examination     Colonoscopy 2009> normal; Diabetic eye exam 12/2008. No diabetic retinopathy; Mammogram 11/10> No evidence of malignancy, DXA 03/14/13 : normal  . Headache(784.0)   . Arthritis   . SBO (small bowel obstruction) 01/09/2013  . Diastolic CHF     Grade I, on Echo 06/2013, EF 55-60%  . Sleep apnea     does not wear CPAP    BP 145/69  Pulse 88  Resp 16  Ht 5\' 5"  (1.651 m)  Wt 197 lb (89.359 kg)  BMI 32.78 kg/m2  SpO2 100%  Opioid Risk Score:   Fall Risk Score: Moderate Fall Risk (6-13 points) (patient educated handout declined)   Review of Systems  Gastrointestinal: Positive for nausea and constipation.  Neurological: Positive for numbness.       Spasm  Psychiatric/Behavioral: Positive for dysphoric mood. The patient is nervous/anxious.   All other systems reviewed and are negative.      Objective:   Physical Exam  Nursing note and vitals reviewed. Constitutional: She is oriented to person, place, and time. She appears well-developed and well-nourished.  HENT:  Head: Normocephalic and atraumatic.  Neck: Normal range of motion. Neck supple.  Cardiovascular: Normal rate and regular rhythm.   Pulmonary/Chest: Effort normal and breath sounds normal.  Musculoskeletal:  Normal Muscle Bulk and Muscle testing Reveals: Upper Extremities: Full ROM and Muscle strength 5/5. Spinal Forward Flexion: 45 Degrees and Extension 20 Degrees. Thoracic Paraspinal Tenderness: T-10- T-12 Lower Extremities: Full ROM and Muscle strength 5/5 Arises from chair with ease Narrow Based Gait  Neurological: She is alert and oriented to person, place, and time.  Skin: Skin is warm and dry.  Psychiatric: She has a normal mood and affect.          Assessment & Plan:  1. Lumbar spondylosis will eval/tx with Bilateral L3-4-5. Scheduled for a MBB  MBB on 06/11/2014 with Dr. Wynn Banker. Oxycodone was dicontinued. RX: Tylenol #3 one tablet three times a  day.  2. Diabetic Neuropathy:  Continue Gabapentin and Tight Glucose Control.   20 minutes of face to face patient care time was spent during this visit. All questions were encouraged and answered.  F/U in 1 month for MBB

## 2014-05-15 ENCOUNTER — Ambulatory Visit (INDEPENDENT_AMBULATORY_CARE_PROVIDER_SITE_OTHER): Payer: PRIVATE HEALTH INSURANCE | Admitting: Dietician

## 2014-05-15 DIAGNOSIS — IMO0002 Reserved for concepts with insufficient information to code with codable children: Secondary | ICD-10-CM

## 2014-05-15 DIAGNOSIS — Z794 Long term (current) use of insulin: Secondary | ICD-10-CM

## 2014-05-15 DIAGNOSIS — IMO0001 Reserved for inherently not codable concepts without codable children: Secondary | ICD-10-CM

## 2014-05-15 DIAGNOSIS — E1165 Type 2 diabetes mellitus with hyperglycemia: Secondary | ICD-10-CM

## 2014-05-15 NOTE — Progress Notes (Signed)
Medical Nutrition Therapy:  Appt start time: 1405 end time:  1505  Assessment:  Primary concerns today: Blood sugar control.  Patient's concerns are having her blood sugars under better control, more freedom in food choice and weight loss.  She brought food records today demonstrating good ability to count carbs. Good mood today and optimistic about using Accu chek Aviva Expert meter for bolus advice. Home health nurse asked her to ask for dietary carb recommendations/restrictions. None given as using the new meter should allow patient to vary her carb intake and patient has consistently verbalized high diabetes distress and food limitations seems to worsen this for her.  She says she is often in pain and we discussed that this does not increase her blood sugar so she does not need to use the illness setting for pain. She is thinking about getting back to her daily activity.   CBG 253 today premeal, fasting often high, patient says they are high even on nights when she does not eat nocturnally. Suggested noctural CBgs 2x/week.  INSULIN:  Current basal is 30 units, current bolus average is  ~ 31 units for a TDD of 61 units(0.69 units/kg), CIR: 1 unit to 7.3 grams carbs, Correction factor: 1 unit lowers CBG ~ 29-30 mg/dl   Meter instructions provided with settings to assist patient with calculating her mealtime insulin based on her carb intake per orders from Dr. Everardo BealsSharda which will be scanned.  Preferred Learning Style:   No preference indicated  Learning Readiness:   Contemplating  Ready  Progress Towards Goal(s):  Some progress.   Nutritional Diagnosis:  -2.3 Food-medication interaction As related to lack of coordiantion between food intake and insulin dosages  As evidenced by her fluctuating blood sugars and frustration with being restricted on what and when she eats to have to control her blood sugars.    Intervention:  Nutrition education about Aviva expert meter and carb counting. Patient  to use new meter and call Friday to let us know how she likes using the meter and schedule a follow up visit. Teaching Method Utilized: Visual, Auditory, Hands on Handouts given during visit include:  Training book for aviva Expert meter  Barriers to learning/adherence to lifestyle change: Mood/depression/diabetes distress  Demonstrated degree of understanding via:  Teach Back   Monitoring/Evaluation:  Dietary intake, exercise, blood sugars, and body weight prn.

## 2014-05-15 NOTE — Patient Instructions (Signed)
Dear [name],  I forgot to remind you that to test blood sugar 2x/week in the next week in the middle of the night (2-4 am) to see if high or low blood sugars are causing you to be hungry at this time.   You can use your new meter for bolus advice for both meals and snacks as long as you know the carbohydrate content of what you ate- the more you use the meter the better advice it will give you!    I am including the warranty card for the meter that I forgot to give you.   Call anytime!   Sincerely, Lupita LeashDonna  [phone]

## 2014-05-17 ENCOUNTER — Telehealth: Payer: Self-pay | Admitting: Dietician

## 2014-05-17 NOTE — Telephone Encounter (Signed)
Left message for patient that she should go ahead and use the accu chek aviva expert meter for bolus advice for snacks and meals and give herself the insulin it recommends according to the amount of carbohydrate she is going to eat and enters in the meter. It may be more injections than she was taking, but she should feel better and gets much better blood sugars by dosing her insulin more closely to the way her pancreas would. Asked her to call me if she can to discuss before we close.

## 2014-05-17 NOTE — Telephone Encounter (Signed)
Spoke with patient about using her new meter and covering snacks with insulin as well as her meals by using the bolus advice on the meter. She likes the meter so far, blood sugars are trending down but were high to start. She verbalized understandng to the information provided.

## 2014-05-21 ENCOUNTER — Other Ambulatory Visit: Payer: Self-pay

## 2014-05-21 ENCOUNTER — Ambulatory Visit (HOSPITAL_BASED_OUTPATIENT_CLINIC_OR_DEPARTMENT_OTHER): Payer: PRIVATE HEALTH INSURANCE | Admitting: Physical Medicine & Rehabilitation

## 2014-05-21 ENCOUNTER — Encounter: Payer: Self-pay | Admitting: Physical Medicine & Rehabilitation

## 2014-05-21 ENCOUNTER — Telehealth: Payer: Self-pay | Admitting: Dietician

## 2014-05-21 VITALS — BP 137/61 | HR 86 | Resp 14 | Ht 65.0 in | Wt 197.0 lb

## 2014-05-21 DIAGNOSIS — Z794 Long term (current) use of insulin: Principal | ICD-10-CM

## 2014-05-21 DIAGNOSIS — E1165 Type 2 diabetes mellitus with hyperglycemia: Secondary | ICD-10-CM

## 2014-05-21 DIAGNOSIS — M47817 Spondylosis without myelopathy or radiculopathy, lumbosacral region: Secondary | ICD-10-CM | POA: Diagnosis not present

## 2014-05-21 DIAGNOSIS — IMO0002 Reserved for concepts with insufficient information to code with codable children: Secondary | ICD-10-CM

## 2014-05-21 MED ORDER — GLUCOSE BLOOD VI STRP: ORAL_STRIP | Status: DC

## 2014-05-21 NOTE — Telephone Encounter (Signed)
Needs test strips and Blood sugar a ittle high in the morning-wonders if her lantus can be increased? Then went on to say how new meter is working for her "eating only two meals day so at night eats a real good snack and takes the insulin to cover it. Has not gone over 13 units and thinks that's pretty good.  blood sugars coming down. Having back injections today ( ? If she is anticipating increased blood sugars- the correction in the meter should help her to cover that)  Can we have patient increase lantus 1 unit every 2 days until fasting CBGs 100-140? Also, suggest test strips Rx be increased as new meter depends on glucose values to assist with bolus advice.

## 2014-05-21 NOTE — Progress Notes (Signed)
  PROCEDURE RECORD [facility]   Name: Dyke Maeslice Bucknam DOB:[birth year] MRN: [id number]  Date:05/21/2014  Physician: Claudette LawsAndrew Kirsteins, MD    Nurse/CMA: Kamari Bilek CMA Allergies:  Allergies  Allergen Reactions  . Cimetidine Other (See Comments)    Breast swelling  . Indomethacin Diarrhea    Caused severe diarrhea with episodes of incontinance  . Penicillins Hives and Itching    Other reaction(s): Delusions (intolerance)  . Indomethacin Other (See Comments)    Severe diarrhea  . Sulfonamide Derivatives     unknown    Consent Signed: Yes.    Is patient diabetic? Yes.    CBG today? 166  Pregnant: No. LMP: No LMP recorded. Patient has had a hysterectomy. (age 63-55)  Anticoagulants: no Anti-inflammatory: no Antibiotics: no  Procedure: Bilateral L3-4-5 Medial Branch Block Position: Prone Start Time: 354 End Time: 404 Fluoro Time: 36  RN/CMA Blessed Cotham CMA Kyna Blahnik CMA    Time 321 406    BP 137/61 160/73    Pulse 86 78    Respirations 14 14    O2 Sat 98 96    S/S 6 6    Pain Level 8/10 0/10     D/C home with Synetta FailAnita, patient A & O X 3, D/C instructions reviewed, and sits independently.

## 2014-05-21 NOTE — Telephone Encounter (Signed)
Discussed high fasting blood sugars with dr. Earlie RavelingNarenda and he wants patient to make an appointment and bring meter in for lantus adjustment. Asked front office to schedule her with Dr. Danella Pentonruong and CDE same day in August. Also, the test strip Rx may be rejected because it says she I not insulin requiring and she is. It may need to be resent or called in correctly.

## 2014-05-21 NOTE — Patient Instructions (Signed)

## 2014-05-21 NOTE — Progress Notes (Signed)
Bilateral Lumbar L3, L4  medial branch blocks and L 5 dorsal ramus injection under fluoroscopic guidance  Indication: Lumbar pain which is not relieved by medication management or other conservative care and interfering with self-care and mobility.  Informed consent was obtained after describing risks and benefits of the procedure with the patient, this includes bleeding, infection, paralysis and medication side effects.  The patient wishes to proceed and has given written consent.  The patient was placed in prone position.  The lumbar area was marked and prepped with Betadine.  One mL of 1% lidocaine was injected into each of 6 areas into the skin and subcutaneous tissue.  Then a 22-gauge 3.5  spinal needle was inserted targeting the junction of the left S1 superior articular process and sacral ala junction. Needle was advanced under fluoroscopic guidance.  Bone contact was made.  Omnipaque 180 was injected x 0.5 mL demonstrating no intravascular uptake.  Then a solution containing one mL of 4 mg per mL dexamethasone and 3 mL of 2% MPF lidocaine was injected x 0.5 mL.  Then the left L5 superior articular process in transverse process junction was targeted.  Bone contact was made.  Omnipaque 180 was injected x 0.5 mL demonstrating no intravascular uptake. Then a solution containing one mL of 4 mg per mL dexamethasone and 3 mL of 2% MPF lidocaine was injected x 0.5 mL.  Then the left L4 superior articular process in transverse process junction was targeted.  Bone contact was made.  Omnipaque 180 was injected x 0.5 mL demonstrating no intravascular uptake.  Then a solution containing one mL of 4 mg per mL dexamethasone and 3 mL if 2% MPF lidocaine was injected x 0.5 mL.  This same procedure was performed on the right side using the same needle, technique and injectate.  Patient tolerated procedure well.  Post procedure instructions were given. 

## 2014-05-22 ENCOUNTER — Other Ambulatory Visit: Payer: Self-pay | Admitting: Dietician

## 2014-05-22 DIAGNOSIS — Z794 Long term (current) use of insulin: Principal | ICD-10-CM

## 2014-05-22 DIAGNOSIS — IMO0002 Reserved for concepts with insufficient information to code with codable children: Secondary | ICD-10-CM

## 2014-05-22 DIAGNOSIS — E1165 Type 2 diabetes mellitus with hyperglycemia: Secondary | ICD-10-CM

## 2014-05-22 MED ORDER — GLUCOSE BLOOD VI STRP: ORAL_STRIP | Status: DC

## 2014-05-22 NOTE — Telephone Encounter (Addendum)
Per Triage nurse, patient called to let us know the strips Rx was sent incorrectly. Patient needed strips for her new Accu chek aviva expert meter ( aviva plus strips) rather than her nano ( smartview strips) meter. Her max testing frequency is 2.4 checks/day so 3x/day testing is reasonable.

## 2014-05-22 NOTE — Addendum Note (Signed)
Addended by: Baird CancerPLYLER, [name] M on: 05/22/2014 04:39 PM   Modules accepted: Orders

## 2014-05-22 NOTE — Telephone Encounter (Signed)
Ordered

## 2014-05-22 NOTE — Telephone Encounter (Signed)
See telephone note from 05/21/14

## 2014-05-31 ENCOUNTER — Ambulatory Visit (HOSPITAL_COMMUNITY): Payer: Self-pay | Admitting: Psychiatry

## 2014-06-02 ENCOUNTER — Telehealth: Payer: Self-pay | Admitting: Internal Medicine

## 2014-06-02 NOTE — Telephone Encounter (Signed)
Pt called on Sat in am. C/o LBP: T#3 bid is not strong enough. I suggested Excedrin 1 bid to add (she can tolerate ASA and Tylenol ok) and f/u w/Dr Wynn BankerKirsteins next week. AP

## 2014-06-03 ENCOUNTER — Telehealth: Payer: Self-pay

## 2014-06-03 MED ORDER — ACETAMINOPHEN-CODEINE #4 300-60 MG PO TABS: 1.0000 | ORAL_TABLET | Freq: Three times a day (TID) | ORAL | Status: DC

## 2014-06-03 NOTE — Addendum Note (Signed)
Addended by: Sherre PootWALSTON, Lynelle Weiler N on: 06/03/2014 02:07 PM   Modules accepted: Orders

## 2014-06-03 NOTE — Telephone Encounter (Signed)
Patient states she is having increased back pain, and she recently had hernia repair surgery. Patient is requesting a stronger pain medication. She said the Tylenol #3 is not helping. Please advise.

## 2014-06-03 NOTE — Telephone Encounter (Signed)
If the patient is experiencing postoperative pain from her hernia operation, she should get pain medication for the next month from her surgeon

## 2014-06-03 NOTE — Telephone Encounter (Signed)
Contacted patient to inform her that Dr. Wynn BankerKirsteins recommended a repeat MBB. Patient has appt schedule for 9/15. Tylenol #4 called in at pharmacy. Patient is aware.

## 2014-06-03 NOTE — Telephone Encounter (Signed)
Schedule for repeat medial branch block If the pain is low back pain, we may change Tylenol 3 to  Tylenol #4, 1 per os 3 times a day #90 one refill

## 2014-06-04 ENCOUNTER — Other Ambulatory Visit: Payer: Self-pay | Admitting: Dietician

## 2014-06-04 ENCOUNTER — Encounter (HOSPITAL_COMMUNITY): Payer: Self-pay | Admitting: Licensed Clinical Social Worker

## 2014-06-04 ENCOUNTER — Ambulatory Visit (INDEPENDENT_AMBULATORY_CARE_PROVIDER_SITE_OTHER): Payer: PRIVATE HEALTH INSURANCE | Admitting: Dietician

## 2014-06-04 ENCOUNTER — Ambulatory Visit (INDEPENDENT_AMBULATORY_CARE_PROVIDER_SITE_OTHER): Payer: PRIVATE HEALTH INSURANCE | Admitting: Internal Medicine

## 2014-06-04 ENCOUNTER — Encounter: Payer: Self-pay | Admitting: Internal Medicine

## 2014-06-04 ENCOUNTER — Other Ambulatory Visit: Payer: Self-pay | Admitting: Internal Medicine

## 2014-06-04 ENCOUNTER — Ambulatory Visit (INDEPENDENT_AMBULATORY_CARE_PROVIDER_SITE_OTHER): Payer: 59 | Admitting: Licensed Clinical Social Worker

## 2014-06-04 VITALS — BP 114/65 | HR 84 | Temp 98.3°F | Wt 197.8 lb

## 2014-06-04 DIAGNOSIS — Z794 Long term (current) use of insulin: Principal | ICD-10-CM

## 2014-06-04 DIAGNOSIS — IMO0002 Reserved for concepts with insufficient information to code with codable children: Secondary | ICD-10-CM

## 2014-06-04 DIAGNOSIS — E1165 Type 2 diabetes mellitus with hyperglycemia: Secondary | ICD-10-CM

## 2014-06-04 DIAGNOSIS — S0300XA Dislocation of jaw, unspecified side, initial encounter: Secondary | ICD-10-CM

## 2014-06-04 DIAGNOSIS — F3341 Major depressive disorder, recurrent, in partial remission: Secondary | ICD-10-CM

## 2014-06-04 DIAGNOSIS — I5032 Chronic diastolic (congestive) heart failure: Secondary | ICD-10-CM

## 2014-06-04 DIAGNOSIS — IMO0001 Reserved for inherently not codable concepts without codable children: Secondary | ICD-10-CM

## 2014-06-04 DIAGNOSIS — R1013 Epigastric pain: Secondary | ICD-10-CM

## 2014-06-04 DIAGNOSIS — I509 Heart failure, unspecified: Secondary | ICD-10-CM

## 2014-06-04 MED ORDER — CYCLOBENZAPRINE HCL 10 MG PO TABS: 10.0000 mg | ORAL_TABLET | Freq: Every day | ORAL | Status: DC

## 2014-06-04 MED ORDER — FUROSEMIDE 40 MG PO TABS: 40.0000 mg | ORAL_TABLET | Freq: Every morning | ORAL | Status: DC
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
## 2014-06-04 NOTE — Assessment & Plan Note (Signed)
Lab Results  Component Value Date   HGBA1C 9.2 04/10/2014   HGBA1C 10.4 01/02/2014   HGBA1C 13.4 09/12/2013     Assessment: Diabetes control:  in progress Progress toward A1C goal:    not at goal Comments: pt has been working w/ Lupita Leashonna for carb counting with insulin therapy. She currently takes lantus 30units qhs and on average takes 10 unit bolus with meals based on carb count. Glucometer readings range from 76-382 w/ most high readings in the morning. Denies sx of hypoglycemia.    Plan: Medications:  increase night time lantus to 35 units, instructed pt to increase lantus dose 1 unit each night until she gets to 35units, then continue until next appt in 2 weeks for adjustments as needed Instruction/counseling given: reminded to bring blood glucose meter & log to each visit Educational resources provided:  Pt meeting with Lupita LeashDonna today.

## 2014-06-04 NOTE — Patient Instructions (Signed)
Dacey,   Keep up the great work!   And yes, taking care of diabetes is work!!! You are correct about that.  Your target for your before breakfast readings is less than 130 mg/dl.  If you get 2 out of 3 readings under 130 then you have reached the appropriate maount of lantus and do not need to increase it any more.  I look forward to seeing you in two weeks.   Take care,   Lupita LeashDonna [phone]

## 2014-06-04 NOTE — Patient Instructions (Addendum)
Please increase your Lantus nightly 1 unit every night until you get to 35 units a night. Then continue 35 units of Lantus at night.   Start taking flexeril 10mg  once at night for 2 weeks to help with your left side jaw pain.   Bring your glucometer to your next appointment in 2 weeks.   Also, please let us know if you would like us to schedule you an appointment to see Dr. Derrell Lollingamirez for your abdominal pain.   General Instructions:   Thank you for bringing your medicines today. This helps us keep you safe from mistakes.   Progress Toward Treatment Goals:  Treatment Goal 04/10/2014  Hemoglobin A1C improved  Blood pressure at goal    Self Care Goals & Plans:  Self Care Goal 04/10/2014  Manage my medications take my medicines as prescribed; bring my medications to every visit  Monitor my health bring my glucose meter and log to each visit; keep track of my blood glucose  Eat healthy foods eat baked foods instead of fried foods; eat foods that are low in salt  Be physically active find an activity I enjoy  Meeting treatment goals -    Home Blood Glucose Monitoring 01/02/2014  Check my blood sugar 3 times a day  When to check my blood sugar before breakfast; at bedtime; before lunch     Care Management & Community Referrals:  Referral 11/15/2013  Referrals made for care management support diabetes educator  Referrals made to community resources weight management; exercise/physical therapy; nutrition

## 2014-06-04 NOTE — Assessment & Plan Note (Signed)
Pt reports lt sided jaw pain associated with ear pain and she also describes posterior neck pain radiating to occiput. She describes feeling stressed out all the time and has noticed that HA's are occuring almost daily now (pt has hx of chronic HA's likely 2/2 to medication overuse from chronic back pain). Pt was also tender to palpation of lt TMJ and posterior neck.  - given rx for flexeril 10mg  QHS x 2 weeks. Will f/u in 2 weeks, if pain is not better pt informed that she will have to take her upper dentures out at the visit so that her upper gums can be visualized.

## 2014-06-04 NOTE — Progress Notes (Signed)
Medical Nutrition Therapy:  Appt start time: 1505 end time:  1530  Assessment:  Primary concerns today: Blood sugar control.  Patient's concerns are having her blood sugars under better control, more freedom in food choice and weight loss.  She rates her self confidence in using the meter at a "3' and in controlling her diabetes at a "2" today.  She says some comfort foods still bother her and sometimes she hates to think about taking insulin every time she eats something that contains carbs. Saw her new counselor today and their goal is "healthy eating". Has been trying to eat less carbs for that reason and verbalizes using this meter has brought her awareness to needing to limit portions more.  Weight is stable.  Meter download for past 30 days: average of 8 days of bolus insulin was 38 unit/day, average blood sugar 186 with range of 76-382 most between 100 and 250, 53% in target range and 47% above target.  INSULIN:  Current basal is 30 units, current bolus average is  ~ 38 units for a TDD of 68 units(0.75 units/kg), CIR: 1 unit to 6.6 grams carbs, Correction factor: 1 unit lowers CBG ~ 26.4 mg/dl   Meter setting adjusted from CIR of 1:8 to 1: 7 and CF from 1: 30 to 1: 28 .  Preferred Learning Style:  No preference indicated  Learning Readiness: Contemplating/Ready  Progress Towards Goal(s):  Some progress.   Nutritional Diagnosis:  [place]-2.3 Food-medication interaction As related to lack of coordination between food intake and insulin dosages is improving  As evidenced by her improved blood sugars and less frustration with being restricted on what and when she eats to have to control her blood sugars.    Intervention:  Nutrition support and encouragement about using [employer] expert meter and carb counting. Coordination of care: Discussed blood sugars with physician today . Also patient requests testing 4 times a day.  Teaching Method Utilized: Visual, Auditory, Hands on Handouts given during visit  include: AVS Barriers to learning/adherence to lifestyle change: Mood/depression/diabetes distress  Demonstrated degree of understanding via:  Teach Back   Monitoring/Evaluation:  Dietary intake, exercise, blood sugars, and body weight in 2 week(s).

## 2014-06-04 NOTE — Telephone Encounter (Signed)
request increase in testing frequency

## 2014-06-04 NOTE — Progress Notes (Signed)
Subjective:     Patient ID: [name], female   DOB: 03/06/1951, 63 y.o.   MRN: [id number]  HPI Pt is a 63 y/o female w/ PMHx of fibromyalgia, chronic HA's, DM2, lumbar spondylosis, GERD, HTN, and dCHF who presents to clinic for diabetes f/u. Her last HbA1c was 9.2 on 6/19. Her last BMET on 6/17 revealed glucose of 324. Patient has met with Butch Penny to try carb counting strategy for insulin administration, which pt does not voice any concerns over. Per glucometer report her sugars range from 76-382. She states that her morning CBGs are usually high, she denies any sx of hypoglycemia. She currently takes lantus 30units QHS and on average a 10 unit bolus with meals. She denies any difficulty with insulin regimen and does not need any diabetes supplies at this time.   Back pain: recently switched from tylenol #3 to tylenol #4 yesterday which she has not picked up yet. Follows w/ Dr. Letta Pate for back pain and recently had b/l lumbar nerve block  Abdominal pain: Pt had recent laparoscopic hernia repair on 5/27 w/ Dr. Rosendo Gros as well as post op visit on 6/25 at which time pt was instructed to f/u with Dr. Rosendo Gros as needed. Pt reports periumbilical pain that is a sharp in nature and moves around. Pain is intermittent and started after hernia repair sx. She feels like pain has progressively improved since sx.  Jaw pain: Pt reports left sided jaw pain that radiates to ear. At one point she reports she could not stick her pinky finger into her left ear b/c it was swollen. She is also having pain in the back of her neck that radiates up to occiput area. She denies grinding her teeth and takes her upper dentures out at night to sleep. She reports feeling stressed but this is not new for her.   HAs: Pt has chronic HA's that feels like a tightness around her frontal forehead. She is on pain meds for chronic back pain and has not tried anything new to alleviate HA's. Sleeping makes HA's go away. HA's are intermittent  and she reports that they are starting to occur daily. She denies any changes in vision.   Review of Systems  HENT: Positive for ear pain. Negative for mouth sores.   Eyes: Negative for visual disturbance.  Cardiovascular: Negative for leg swelling.  Gastrointestinal: Positive for abdominal pain.  Endocrine:       Hot flashes   Musculoskeletal: Positive for neck pain.  Neurological: Positive for headaches. Negative for dizziness, syncope and light-headedness.       Objective:   Physical Exam  Constitutional: She appears well-developed and well-nourished.  HENT:  Head: Normocephalic.  Mouth/Throat: Oropharynx is clear and moist.  Tender to palpation of lt TMJ, no clicking/cracking palpated on opening of jaw  Eyes: Pupils are equal, round, and reactive to light.  Neck: Normal range of motion.  Tender to palpation of posterior neck   Cardiovascular: Normal rate and regular rhythm.   No murmur heard. Pulmonary/Chest: Effort normal and breath sounds normal. No respiratory distress.  Abdominal: Bowel sounds are normal. There is tenderness (tender to palpation in periumbilical region, midline incision at umbilicus, trochar incision scars on  rt and lt middle quadrants). There is no rebound and no guarding.  Musculoskeletal: She exhibits no edema.       Assessment:     Please see problem based assessment and plan.     Plan:     Please see problem based  assessment and plan.       

## 2014-06-04 NOTE — Assessment & Plan Note (Signed)
Pt given refill of lasix. Reports compliance and denies pedal edema.

## 2014-06-04 NOTE — Assessment & Plan Note (Signed)
Pt had recent hernia repair sx on 5/27. Pt reporting sharp periumbilical pain that has progressively gotten better after sx.   -Informed patient if pain gets worse she will need to f/u with Dr. Derrell Lollingamirez who performed her hernia repair sx, otherwise sx seem to be improving.

## 2014-06-05 MED ORDER — GLUCOSE BLOOD VI STRP: ORAL_STRIP | Status: DC

## 2014-06-10 ENCOUNTER — Telehealth: Payer: Self-pay

## 2014-06-10 NOTE — Telephone Encounter (Signed)
Patient states she is having increased back pain. She said the medication nor the injections seem to help with her pain. She is asking for other suggestions to help with the pain. Please advise.

## 2014-06-11 ENCOUNTER — Ambulatory Visit (INDEPENDENT_AMBULATORY_CARE_PROVIDER_SITE_OTHER): Payer: 59 | Admitting: Licensed Clinical Social Worker

## 2014-06-11 ENCOUNTER — Ambulatory Visit: Payer: Self-pay | Admitting: Physical Medicine & Rehabilitation

## 2014-06-11 DIAGNOSIS — F330 Major depressive disorder, recurrent, mild: Secondary | ICD-10-CM

## 2014-06-11 NOTE — Progress Notes (Signed)
   THERAPIST PROGRESS NOTE  Session Time: 9:08AM -10:01AM  Participation Level: Active  Behavioral Response: Neat and Well GroomedAlertTearful  Type of Therapy: Individual Therapy  Treatment Goals addressed: Diagnosis: Depression  Interventions: CBT, Motivational Interviewing, Strength-based and Supportive  Summary: [name] is a 63 y.o. female who presents with symptoms of depression. Patient was tearful for the majority of the session while talking about her past. Patient discussed her childhood/family. Patient discussed difficult relationships with her parents and how she feels that has affected her as a parent.  Patient discussed her relationship with her two children and having custody of her two grandchildren. Patient discussed how her past has affected her current marriage and expressed some of the goals that she has for her present marriage. Patient reports that she would like to be more disciplined to study the bible. Patient reports that she would like to work towards managing her emotions and have the ability to express her emotions without threatening others.  Patient reports that she is hopeful and willing to work on her goals. Patient denied SI with no intent or plan.  Suicidal/Homicidal: Nowithout intent/plan  Therapist Response:  Therapist completed the patients assessment by asking the remaining questions. Therapist engaged the patient in recalling and retelling her past. Therapist encouraged the patient to tell the narrative of her past.  Therapist informed the patient that this will assist in providing background information and providing a foundation for the goals the patient will develop while in therapy. The therapist listened actively and provided positive feedback as needed.   Plan: Return again in 1 week.  Diagnosis: Axis I: Major Depression, Recurrent    Axis II: No diagnosis    Genice RougeHerbin, Nivan Melendrez M, LCSW 06/11/2014

## 2014-06-12 NOTE — Telephone Encounter (Signed)
Pt with excelllent short term relief with [facility] would rec repeat to eval for RF

## 2014-06-12 NOTE — Telephone Encounter (Signed)
Patient called again. Contacted patient to inform her that Dr. Wynn BankerKirsteins is out of the office until Thursday.

## 2014-06-12 NOTE — Telephone Encounter (Signed)
Contacted patient to inform her per Dr. Wynn BankerKirsteins he would recommend a evaluation for a RF. Patient has appt on 9/15 with AK.

## 2014-06-18 ENCOUNTER — Other Ambulatory Visit: Payer: Self-pay | Admitting: Internal Medicine

## 2014-06-18 ENCOUNTER — Ambulatory Visit (HOSPITAL_COMMUNITY)
Admission: RE | Admit: 2014-06-18 | Discharge: 2014-06-18 | Disposition: A | Discharge status: Home / Self Care | Payer: PRIVATE HEALTH INSURANCE | Source: Ambulatory Visit | Attending: Internal Medicine | Admitting: Internal Medicine

## 2014-06-18 ENCOUNTER — Encounter: Payer: Self-pay | Admitting: Dietician

## 2014-06-18 ENCOUNTER — Encounter: Payer: Self-pay | Admitting: Internal Medicine

## 2014-06-18 ENCOUNTER — Ambulatory Visit (INDEPENDENT_AMBULATORY_CARE_PROVIDER_SITE_OTHER): Payer: PRIVATE HEALTH INSURANCE | Admitting: Internal Medicine

## 2014-06-18 ENCOUNTER — Ambulatory Visit (INDEPENDENT_AMBULATORY_CARE_PROVIDER_SITE_OTHER): Payer: PRIVATE HEALTH INSURANCE | Admitting: Dietician

## 2014-06-18 ENCOUNTER — Inpatient Hospital Stay (HOSPITAL_COMMUNITY)
Admission: RE | Admit: 2014-06-18 | Discharge: 2014-06-18 | Disposition: A | Discharge status: Home / Self Care | Payer: PRIVATE HEALTH INSURANCE | Source: Ambulatory Visit

## 2014-06-18 VITALS — BP 156/79 | HR 78 | Temp 97.5°F | Wt 203.4 lb

## 2014-06-18 DIAGNOSIS — G5603 Carpal tunnel syndrome, bilateral upper limbs: Secondary | ICD-10-CM

## 2014-06-18 DIAGNOSIS — G8929 Other chronic pain: Secondary | ICD-10-CM

## 2014-06-18 DIAGNOSIS — R079 Chest pain, unspecified: Secondary | ICD-10-CM | POA: Insufficient documentation

## 2014-06-18 DIAGNOSIS — Z794 Long term (current) use of insulin: Secondary | ICD-10-CM

## 2014-06-18 DIAGNOSIS — IMO0001 Reserved for inherently not codable concepts without codable children: Secondary | ICD-10-CM

## 2014-06-18 DIAGNOSIS — E1165 Type 2 diabetes mellitus with hyperglycemia: Secondary | ICD-10-CM

## 2014-06-18 DIAGNOSIS — IMO0002 Reserved for concepts with insufficient information to code with codable children: Secondary | ICD-10-CM

## 2014-06-18 DIAGNOSIS — I10 Essential (primary) hypertension: Secondary | ICD-10-CM

## 2014-06-18 DIAGNOSIS — M2669 Other specified disorders of temporomandibular joint: Secondary | ICD-10-CM

## 2014-06-18 DIAGNOSIS — M26629 Arthralgia of temporomandibular joint, unspecified side: Secondary | ICD-10-CM

## 2014-06-18 DIAGNOSIS — G5600 Carpal tunnel syndrome, unspecified upper limb: Secondary | ICD-10-CM

## 2014-06-18 DIAGNOSIS — R0789 Other chest pain: Secondary | ICD-10-CM

## 2014-06-18 LAB — GLUCOSE, CAPILLARY: Glucose-Capillary: 169 mg/dL — ABNORMAL HIGH (ref 70–99)

## 2014-06-18 MED ORDER — ISOSORBIDE MONONITRATE ER 60 MG PO TB24: 60.0000 mg | ORAL_TABLET | Freq: Every morning | ORAL | Status: DC

## 2014-06-18 MED ORDER — METOPROLOL SUCCINATE ER 25 MG PO TB24
25.0000 mg | ORAL_TABLET | Freq: Every morning | ORAL | Status: DC
Start: 2014-06-18 — End: 2015-03-20

## 2014-06-18 MED ORDER — CYCLOBENZAPRINE HCL 10 MG PO TABS: 10.0000 mg | ORAL_TABLET | Freq: Every day | ORAL | Status: DC

## 2014-06-18 NOTE — Patient Instructions (Signed)
General Instructions:  I have refilled you a prescription of flexeril that will last for 2 more weeks, as well as refilled your blood pressure medications - Imdur and metoprolol.   I will placed a referral for you to see an orthopedic physician for your carpal tunnel syndrome.  Please follow up with your cardiologist for possible stress test that was ordered earlier this year considering you have diabetes which is a risk factor for heart disease.   Continue taking your insulin as directed, make sure to take in to account how much activity you will be doing to prevent low blood sugars.    Thank you for bringing your medicines today. This helps [place] keep you safe from mistakes.   Self Care Goals & Plans:  Self Care Goal 04/10/2014  Manage my medications take my medicines as prescribed; bring my medications to every visit  Monitor my health bring my glucose meter and log to each visit; keep track of my blood glucose  Eat healthy foods eat baked foods instead of fried foods; eat foods that are low in salt  Be physically active find an activity I enjoy  Meeting treatment goals -    Home Blood Glucose Monitoring 01/02/2014  Check my blood sugar 3 times a day  When to check my blood sugar before breakfast; at bedtime; before lunch     Care Management & Community Referrals:  Referral 11/15/2013  Referrals made for care management support diabetes educator  Referrals made to community resources weight management; exercise/physical therapy; nutrition

## 2014-06-18 NOTE — Progress Notes (Signed)
Medical Nutrition Therapy:  Appt start time: 1010 end time:  1025  Assessment:  Primary concerns today: Blood sugar control.  Patient's concerns are having her blood sugars under better control, more freedom in food choice and weight loss. She is not happy with a 5# weight gain.  She gets tired of writing everything down all the time and counting her carbs. Meter download for past 30 days:overal her blood sugars are much improved, she had one documented low blood sugar and one undocumented low blood sugar on days when she was busier than usual activity wise. Her  average of 11 days of bolus insulin was 31 unit/day, average blood sugar 148, 73% in target range and 24% above target.  INSULIN:  Current basal is 35 units, current bolus average is  ~ 31 units for a TDD of 67 units, CIR: 1 unit to 7 grams carbs, Correction factor: 1 unit lowers CBG ~ 27 mg/dl  Maintain Meter settings: CIR of 1: 7 and  to CF of 1: 28 .   Progress Towards Goal(s):  Some progress.   Nutritional Diagnosis:  [place]-2.3 Food-medication interaction As related to lack of coordination between food intake and insulin dosages is much improved As evidenced by her improved blood sugars and less frustration with being restricted on what and when she eats to have to control her blood sugars.    Intervention:  Nutrition support and encouragement about using Aviva expert meter and carb counting. Coordination of care: Discussed blood sugars with physician today . Patient to use exercise adjustment for "busy" days. Take a break from recording food intake except when having lows or highs. Teaching Method Utilized: Visual, Auditory, Hands on Handouts given during visit include: AVS Barriers to learning/adherence to lifestyle change: Mood/depression/diabetes distress  Demonstrated degree of understanding via:  Teach Back   Monitoring/Evaluation:  Dietary intake, exercise, blood sugars, and body weight in 6 week(s).

## 2014-06-18 NOTE — Progress Notes (Signed)
   Subjective:    Patient ID: [name], female    DOB: September 22, 1951, 63 y.o.   MRN: [id number]  HPI Pt is a 63 y/o female w/ PMHx of fibromyalgia, chronic HA's, DM2, lumbar spondylosis, GERD, HTN, and dCHF who presents to clinic for diabetes f/u. Her lantus was increased to 35 units during her last visit, and she is continuing carb counting with novolog supplementation. Her CBG today is 169, her last A1c in June 2015 was 9.2. Pt brought her glucometer log in today which revealed one low reading of 63 and the highest reading of 273. Pt has been exercising but has not accounted for exercise by decreasing insulin bolus dose by 5% when carb counting. She noted that the low reading of 63 was when she was active during a church picnic. Otherwise, the majority of her readings have been w/in goal versus during her last visit when it was about 50/50 that were within goal.   Carpal Tunnel: Pt reports increase in numbness and tingling in both hands with increased swelling of rt hand since June. She states that she will wake up with numbness in her hands.  She has seen Dr. Dion Saucier w/ ortho in the past for her carpal tunnel and was suppose to get a nerve conduction study but never got it. She was given wrist braces but she took the metal wiring out of them b/c it hurt her forearms. Numbness and tingling start at ring fingers and do not affect her pinky fingers.   TMJ: Pt was given flexeril  QHS for her TMJ syndrome during last visit which she states helped significantly but pain has not gone away completely, she is requesting refill of flexeril.   Chest pain: She reports having substernal chest pain located above her diaphragm last week during bible study. Pt was diaphoretic during chest pain episode but denies radiation of pain down arms or to back and denies SOB. She then went home and went to sleep after which pain resolved. She describes chest pain is a grabbing sensation "like something was breaking." Pt has  had similar episodes such as this before. Of note pt has hx of chronic chest pain w/ negative work up to date. She sees Dr. Matthias Hughs for her acid reflux, and he takes care of her dexilant refills.   Pt is also requesting refills of her BP med and imdur.     Review of Systems  Respiratory: Negative for shortness of breath.   Cardiovascular: Positive for chest pain (intermittent).  Musculoskeletal: Positive for back pain (chronic).  Neurological: Positive for numbness (hands).       Objective:   Physical Exam  Constitutional: She appears well-developed and well-nourished.  Eyes: Pupils are equal, round, and reactive to light.  Cardiovascular: Normal rate and regular rhythm.   Pulmonary/Chest: Effort normal and breath sounds normal. She exhibits tenderness.  Abdominal: Soft. Bowel sounds are normal.  Musculoskeletal:  Equal strength in upper extremities and hand grip b/l, postive phalen's test, negative tinnels, no hand swelling noted          Assessment & Plan:  Please see problem based assessment and plan.

## 2014-06-18 NOTE — Patient Instructions (Signed)
You are doing fantastic at caring for your diabetes!!!  Please start using the exercise adjustment for "busy" days.   Give yourself a break from recording food intake except when having lows or highs.

## 2014-06-19 ENCOUNTER — Ambulatory Visit (INDEPENDENT_AMBULATORY_CARE_PROVIDER_SITE_OTHER): Payer: 59 | Admitting: Licensed Clinical Social Worker

## 2014-06-19 DIAGNOSIS — F331 Major depressive disorder, recurrent, moderate: Secondary | ICD-10-CM

## 2014-06-19 NOTE — Progress Notes (Addendum)
   THERAPIST PROGRESS NOTE  Session Time: 9:20AM-9:50AM  Participation Level: Active  Behavioral Response: Neat and Well GroomedAlertAnxious  Type of Therapy: Individual Therapy  Treatment Goals addressed: Anxiety  Interventions: Motivational Interviewing, Strength-based and Supportive  Summary: [name] is a 63 y.o. female who presents with symptoms of anxiety and depression. Patient was casual and well groomed. Patient appeared to be nervous and rubbed her hands together during the majority of the session. Passion eyes appeared flighty from side to side at times with minimal eye contact. Patient reports that she feels "weird" and "very nervous" for the past few days. Patient reports that she feels fine physically. Patient discussed her relationship with her husband and how that relationship affects her. Patient discussed her goals and completed a treatment plan and received a copy. Patient reports that she will see the psychiatrist here in a week or so and discuss her "nervous feeling" with the psychiatrist. Patient reports that she has been able to stick to her diet more and is ready to engage in her treatment plan, but would like to get over this "nervous" feeling first.    Suicidal/Homicidal: Nowithout intent/plan  Therapist Response: Therapist assessed the patients feelings and emotions since the last session. Therapist completed the treatment plan with the patient and assessed the patients readiness for change. Therapist assessed patient's mood. Patient reports that she feels "fine, but nervous."Therapist listened actively and encouraged the patient to speak with the Psychiatrist about how she has been feeling. Therapist reflected the patients thought and displayed cues of listening.   Plan: Return again in 2 weeks.  Diagnosis: Axis I: Major Depression, Recurrent     Axis II: No diagnosis    Genice Rouge, LCSW 06/19/2014

## 2014-06-20 NOTE — Assessment & Plan Note (Signed)
BP Readings from Last 3 Encounters:  06/18/14 156/79  06/04/14 114/65  05/21/14 137/61    Lab Results  Component Value Date   NA 137 04/10/2014   K 5.2 04/10/2014   CREATININE 1.61* 04/10/2014    Assessment: Blood pressure control:  not well controlled Progress toward BP goal:   deteriorated  Comments: requesting refills of imdur  qAM and toprol XL 25 qAM  Plan: Medications:  continue current medications Other plans: given refills of her BP meds above

## 2014-06-20 NOTE — Assessment & Plan Note (Signed)
Lab Results  Component Value Date   HGBA1C 9.2 04/10/2014   HGBA1C 10.4 01/02/2014   HGBA1C 13.4 09/12/2013     Assessment: Diabetes control:  uncontrolled Progress toward A1C goal:   progressing towards goal Comments: pt is taking lantus 35 units at night and carb counting w/ novolog supplementation (average of 31units per day per glucometer report)  Plan: Medications:  continue current medications Instruction/counseling given: reminded to bring blood glucose meter & log to each visit Other plans: Pt meets w/ Lupita Leash regularly to discuss carb counting w/ aviva glucometer which instructs patient on how much novolog to take w/ meals. She is doing well with method. She was educated to deducted 5% off her insulin bolus when exercising and increase insulin bolus by 5% when she is sick.

## 2014-06-20 NOTE — Assessment & Plan Note (Signed)
Pt complaining of carpal tunnel syndrome w/ worsening of symptoms since June. She follows w/ Dr. Dion Saucier w/ ortho for her carpal tunnel. She never went back to get her nerve conduction studies. She also is wearing her wrist splints but without the metal wiring to keep her wrists extended.  - will schedule pt appt to see hand specialist at Dr. Shelba Flake office for nerve conduction studies - informed patient she needs to try to put back in metal supports in her splints so that it can keep her wrists extended or get new splints.

## 2014-06-20 NOTE — Assessment & Plan Note (Signed)
Pt was given flexeril  qhs for her TMJ syndrome during last visit. Pt feels improvement with medication but not complete resolution and would like more flexeril.   - refilled flexeril  qhs x 14 days

## 2014-06-20 NOTE — Assessment & Plan Note (Addendum)
Pt describing having intermittent chest pain. She had one episode last week when she had to leave bible study and go to sleep after which chest pain resolved the next morning. This is a chronic problem for patient and she has had a negative work up to date. EKG in clinic today negative for acute changes, no ST elevations. Likely MSK in etiology as she was tender to rt chest wall palpation or GERD for which she sees a GI specialist for. She was also scheduled to get a stress test that was ordered by Dr. Eden Emms but never went to get it.   - will refer to cardiology to get stress test.

## 2014-06-21 NOTE — Progress Notes (Signed)
Internal Medicine Clinic Attending Date of visit: 06/18/2014  I saw and evaluated the patient.  I personally confirmed the key portions of the history and exam documented by Dr. Truong and I reviewed pertinent patient test results.  The assessment, diagnosis, and plan were formulated together and I agree with the documentation in the resident's note. 

## 2014-07-02 ENCOUNTER — Telehealth (HOSPITAL_COMMUNITY): Payer: Self-pay | Admitting: Licensed Clinical Social Worker

## 2014-07-02 NOTE — Telephone Encounter (Signed)
LCSW contacted the patient to check in to see how she has been since the last visit, patient reports that she is no longer feeling "jittery inside" but she feels that "it has changed to more frustration and anger." Patient reports that she is scheduled to meet with the Psychiatrist on Friday and then scheduled with the LCSW on Tuesday. LCSW assessed the patients status and patient reports that she is "fine right now." LCSW reminded the patient that she could call in for an earlier appointment, or call mobile crisis, or go upstairs if needed. Patient reports that she thought about going upstairs the day after her last appointment but "bypassed it" and she feels fine now. [facility]

## 2014-07-03 ENCOUNTER — Encounter: Payer: Self-pay | Admitting: Internal Medicine

## 2014-07-04 ENCOUNTER — Telehealth: Payer: Self-pay

## 2014-07-04 NOTE — Telephone Encounter (Addendum)
Patient is requesting permission to fill RX from dentist. Patient had a dental procedure today. Attempted to contact patient to see which medication was given by dentist. Left a voicemail to return call.  Patient returned call. The dentist prescribed Norco 5-325 mg. Please advise if this is okay?

## 2014-07-05 ENCOUNTER — Ambulatory Visit (INDEPENDENT_AMBULATORY_CARE_PROVIDER_SITE_OTHER): Payer: 59 | Admitting: Psychiatry

## 2014-07-05 VITALS — BP 115/65 | HR 75 | Ht 65.0 in | Wt 198.4 lb

## 2014-07-05 DIAGNOSIS — F329 Major depressive disorder, single episode, unspecified: Secondary | ICD-10-CM

## 2014-07-05 DIAGNOSIS — F332 Major depressive disorder, recurrent severe without psychotic features: Secondary | ICD-10-CM

## 2014-07-05 DIAGNOSIS — F32A Depression, unspecified: Secondary | ICD-10-CM

## 2014-07-05 MED ORDER — FLUOXETINE HCL 20 MG PO CAPS: 20.0000 mg | ORAL_CAPSULE | Freq: Every morning | ORAL | Status: DC

## 2014-07-05 MED ORDER — BUSPIRONE HCL 10 MG PO TABS: ORAL_TABLET | ORAL | Status: DC

## 2014-07-05 MED ORDER — BUPROPION HCL ER (XL) 150 MG PO TB24: 450.0000 mg | ORAL_TABLET | Freq: Every morning | ORAL | Status: DC

## 2014-07-05 NOTE — Telephone Encounter (Signed)
Contacted patient to inform her per Dr. Wynn Banker she could take the Norco post op period, but no more than 1-2 weeks. Patient verbalized understanding.

## 2014-07-05 NOTE — Telephone Encounter (Signed)
Ok for post op period no more than 1-2 wks

## 2014-07-05 NOTE — Progress Notes (Signed)
[facility] MD Progress Note  07/05/2014 11:34 AM [name]  MRN:  [id number] Subjective: Little more sad The patient says at this time her mood is fairly stable. A few weeks ago however she did feel much more depressed. She went on trip with her husband but he insisted on drinking wine. 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 She really does not want her husband to drink because he changes when he drinks. There is also a threat to her sobriety. Generally the patient has somewhat of an abnormal sleep pattern. She does not have daytime dysfunction but she has broken sleep. The patient is eating well. Her energy level is fair to poor. She is having some problems concentrating. The patient doesn't seem to be enjoying things quite as well but describes moderate anxiety. The patient is not suicidal. Patient is very distressed be mainly because of pain and physical complaints. She also is bothered or brachial by her husband's drinking. The patient has just entered into therapy and seems to be benefiting from. The patient is not suicidal. Diagnosis:   DSM5: Schizophrenia Disorders:   Obsessive-Compulsive Disorders:   Trauma-Stressor Disorders:   Substance/Addictive Disorders:   Depressive Disorders:  Major Depressive Disorder - Mild (296.21) Total Time spent with patient: 20 minutes  Axis I: Major Depression, Recurrent severe  ADL's:  Intact  Sleep: Good  Appetite:  Good  Suicidal Ideation:  no Homicidal Ideation:  none AEB (as evidenced by):  Psychiatric Specialty Exam: Physical Exam  ROS  Blood pressure 115/65, pulse 75, height  (1.651 m), weight 198 lb 6.4 oz (89.994 kg).Body mass index is 33.02 kg/(m^2).  General Appearance: Casual  Eye Contact::  Good  Speech:  Clear and Coherent  Volume:  Normal  Mood:  Depressed  Affect:  Appropriate  Thought Process:  Coherent  Orientation:  Full (Time, Place, and Person)  Thought Content:  WDL  Suicidal Thoughts:  No  Homicidal  Thoughts:  No  Memory:  NA  Judgement:  Good  Insight:  Good  Psychomotor Activity:  Normal  Concentration:  Good  Recall:  Good  Fund of Knowledge:Good  Language: Good  Akathisia:  No  Handed:  Right  AIMS (if indicated):     Assets:  Communication Skills  Sleep:      Musculoskeletal: Strength & Muscle Tone:  Gait & Station:  Patient leans:   Current Medications: Current Outpatient Prescriptions  Medication Sig Dispense Refill  . ACCU-CHEK FASTCLIX LANCETS MISC       . acetaminophen-codeine (TYLENOL #4) 300-60 MG per tablet Take 1 tablet by mouth 3 (three) times daily.  90 tablet  1  . aspirin 81 MG chewable tablet Chew 81 mg by mouth daily.      . BD PEN NEEDLE NANO U/F 32G X 4 MM MISC USE TO TEST UP TO 5 TIMES A DAY ACCORDING TO CBG  100 each  4  . buPROPion (WELLBUTRIN XL) 150 MG 24 hr tablet Take 3 tablets (450 mg total) by mouth every morning.  90 tablet  5  . busPIRone (BUSPAR) 10 MG tablet 1 qam 1  q dinner  With food for 1 week then 1 qam 2 qpm with dinner therafter  90 tablet  5  . cloNIDine (CATAPRES - DOSED IN MG/24 HR) 0.2 mg/24hr patch PLACE 1 PATCH ONTO THE SKIN EVERY 7 DAYS  4 patch  3  . cyclobenzaprine (FLEXERIL) 10 MG tablet Take 1 tablet (10 mg total) by mouth  at bedtime.  14 tablet  0  . dexlansoprazole (DEXILANT) 60 MG capsule Take 60 mg by mouth daily.       Tery Sanfilippo Sodium (COLACE PO) Take 2 tablets by mouth daily as needed (constipation).      Marland Kitchen ergocalciferol (VITAMIN D2) 50000 UNITS capsule Take 1 capsule (50,000 Units total) by mouth once a week.  4 capsule  3  . FLUoxetine (PROZAC) 20 MG capsule Take 1 capsule (20 mg total) by mouth every morning.  30 capsule  10  . furosemide (LASIX) 40 MG tablet Take 1 tablet (40 mg total) by mouth every morning.  30 tablet  3  . gabapentin (NEURONTIN) 800 MG tablet Take 1 tablet (800 mg total) by mouth as directed.  90 tablet  11  . glucose blood (ACCU-CHEK AVIVA PLUS) test strip Check blood sugar up to 4 times a  day as instructed  125 each  5  . insulin aspart (NOVOLOG) 100 UNIT/ML injection Inject into the skin 3 (three) times daily with meals. Take as directed by your aviva glucometer.      . Insulin Glargine (LANTUS SOLOSTAR) 100 UNIT/ML Solostar Pen Inject 30 Units into the skin daily at 10 pm.  15 mL  11  . isosorbide mononitrate (IMDUR) 60 MG 24 hr tablet Take 1 tablet (60 mg total) by mouth every morning.  30 tablet  6  . losartan (COZAAR) 100 MG tablet Take 50 mg by mouth every morning.      . metoprolol succinate (TOPROL-XL) 25 MG 24 hr tablet Take 1 tablet (25 mg total) by mouth every morning.  30 tablet  6  . ondansetron (ZOFRAN) 4 MG tablet       . rosuvastatin (CRESTOR) 10 MG tablet Take 1 tablet (10 mg total) by mouth at bedtime.  30 tablet  11  . senna (SENOKOT) 8.6 MG TABS Take 4 tablets by mouth daily as needed (constipation).       No current facility-administered medications for this visit.    Lab Results: No results found for this or any previous visit (from the past 48 hour(s)).  Physical Findings: AIMS:  , ,  ,  ,    CIWA:    COWS:     Treatment Plan Summary: At this time we will add BuSpar 10 mg one in the morning and 2 at supper time. At this time I choose not to give this patient a benzodiazepine given her history of alcohol and that her anxiety really has not dysfunctional. I think the patient has some difficult issues to deal with including physical complaints and alcohol issues with her husband. I think the best intervention would be to refer the patient to an Al-Anon group. My hope is her therapist will do just that. At this time the patient will discontinue the Prozac which had no benefit. Noted that she's on maximum dose Wellbutrin. I would like to give this patient some time to respond to the BuSpar for anxiety and when she returns her next visit I would consider increasing it to the maximum dose. Hopefully by that time she has had more sessions in  therapy.  Plan:  Medical Decision Making Problem Points:  Established problem, worsening (2) Data Points:  Review of new medications or change in dosage (2)  I certify that inpatient services furnished can reasonably be expected to improve the patient's condition.   Raidyn Breiner IRVING 07/05/2014, 11:34 AM

## 2014-07-09 ENCOUNTER — Ambulatory Visit (INDEPENDENT_AMBULATORY_CARE_PROVIDER_SITE_OTHER): Payer: 59 | Admitting: Licensed Clinical Social Worker

## 2014-07-09 ENCOUNTER — Encounter: Payer: Self-pay | Admitting: Physical Medicine & Rehabilitation

## 2014-07-09 ENCOUNTER — Ambulatory Visit (HOSPITAL_BASED_OUTPATIENT_CLINIC_OR_DEPARTMENT_OTHER): Payer: PRIVATE HEALTH INSURANCE | Admitting: Physical Medicine & Rehabilitation

## 2014-07-09 ENCOUNTER — Encounter: Payer: PRIVATE HEALTH INSURANCE | Attending: Physical Medicine & Rehabilitation

## 2014-07-09 VITALS — BP 145/87 | HR 95 | Resp 16 | Ht 65.0 in | Wt 198.0 lb

## 2014-07-09 DIAGNOSIS — M47817 Spondylosis without myelopathy or radiculopathy, lumbosacral region: Secondary | ICD-10-CM

## 2014-07-09 DIAGNOSIS — Z79899 Other long term (current) drug therapy: Secondary | ICD-10-CM | POA: Insufficient documentation

## 2014-07-09 DIAGNOSIS — F3289 Other specified depressive episodes: Secondary | ICD-10-CM | POA: Insufficient documentation

## 2014-07-09 DIAGNOSIS — K219 Gastro-esophageal reflux disease without esophagitis: Secondary | ICD-10-CM | POA: Diagnosis not present

## 2014-07-09 DIAGNOSIS — E1142 Type 2 diabetes mellitus with diabetic polyneuropathy: Secondary | ICD-10-CM | POA: Insufficient documentation

## 2014-07-09 DIAGNOSIS — I2510 Atherosclerotic heart disease of native coronary artery without angina pectoris: Secondary | ICD-10-CM | POA: Diagnosis not present

## 2014-07-09 DIAGNOSIS — G4730 Sleep apnea, unspecified: Secondary | ICD-10-CM | POA: Diagnosis not present

## 2014-07-09 DIAGNOSIS — E1149 Type 2 diabetes mellitus with other diabetic neurological complication: Secondary | ICD-10-CM | POA: Diagnosis not present

## 2014-07-09 DIAGNOSIS — Z87891 Personal history of nicotine dependence: Secondary | ICD-10-CM | POA: Insufficient documentation

## 2014-07-09 DIAGNOSIS — I10 Essential (primary) hypertension: Secondary | ICD-10-CM | POA: Diagnosis not present

## 2014-07-09 DIAGNOSIS — F329 Major depressive disorder, single episode, unspecified: Secondary | ICD-10-CM | POA: Diagnosis not present

## 2014-07-09 DIAGNOSIS — E785 Hyperlipidemia, unspecified: Secondary | ICD-10-CM | POA: Insufficient documentation

## 2014-07-09 DIAGNOSIS — F331 Major depressive disorder, recurrent, moderate: Secondary | ICD-10-CM

## 2014-07-09 MED ORDER — ACETAMINOPHEN-CODEINE #4 300-60 MG PO TABS: 1.0000 | ORAL_TABLET | Freq: Three times a day (TID) | ORAL | Status: DC

## 2014-07-09 NOTE — Progress Notes (Signed)
Bilateral Lumbar L3, L4  medial branch blocks and L 5 dorsal ramus injection under fluoroscopic guidance  Indication: Lumbar pain which is not relieved by medication management or other conservative care and interfering with self-care and mobility.  Informed consent was obtained after describing risks and benefits of the procedure with the patient, this includes bleeding, infection, paralysis and medication side effects.  The patient wishes to proceed and has given written consent.  The patient was placed in prone position.  The lumbar area was marked and prepped with Betadine.  One mL of 1% lidocaine was injected into each of 6 areas into the skin and subcutaneous tissue.  Then a 22-gauge 3.5  spinal needle was inserted targeting the junction of the left S1 superior articular process and sacral ala junction. Needle was advanced under fluoroscopic guidance.  Bone contact was made.  Omnipaque 180 was injected x 0.5 mL demonstrating no intravascular uptake.  Then a solution containing one mL of 4 mg per mL dexamethasone and 3 mL of 2% MPF lidocaine was injected x 0.5 mL.  Then the left L5 superior articular process in transverse process junction was targeted.  Bone contact was made.  Omnipaque 180 was injected x 0.5 mL demonstrating no intravascular uptake. Then a solution containing one mL of 4 mg per mL dexamethasone and 3 mL of 2% MPF lidocaine was injected x 0.5 mL.  Then the left L4 superior articular process in transverse process junction was targeted.  Bone contact was made.  Omnipaque 180 was injected x 0.5 mL demonstrating no intravascular uptake.  Then a solution containing one mL of 4 mg per mL dexamethasone and 3 mL if 2% MPF lidocaine was injected x 0.5 mL.  This same procedure was performed on the right side using the same needle, technique and injectate.  Patient tolerated procedure well.  Post procedure instructions were given. 

## 2014-07-09 NOTE — Progress Notes (Addendum)
   THERAPIST PROGRESS NOTE  Session Time: 9:10AM - 10:00AM  Participation Level: Active  Behavioral Response: Neat and Well GroomedAlertAnxious  Type of Therapy: Individual Therapy  Treatment Goals addressed: Anxiety  Interventions: CBT, Strength-based and Supportive  Summary: [name] is a 63 y.o. female who presents with symptoms of anxiety and depression. Patient was well groomed and neat, with thought process intact.  Patient made poor eye contact and her eyes were shifty throughout the session. Patient reports that her anxiety was reduced but she still experiences pain on a regular basis. Patient reports that the treatment for her pain has not been helpful. Patient reports that she feels that the constant pain is the reason for her constant frustration and anger.  Patient discussed her relationship with her husband and how that effects her emotions. Patient reports that she feels that she would like to live alone, but she "flip flops back and forth" and that she wants her marriage to work.  Patient discussed her relationship with her other family members and how she feels they are "messed up." Patient reports that she has no natural supports besides her pastor who she talks to on occassions. Patient discussed a wedding over the weekend and described different encounters with her family members to discuss the relationship that she has with each of them.  Patient became tearful and reports that she often "wonder what it feels like on the other side." Patient reports that she feels that she would have "no pain and be blissful." Patient reports that she has no current or recent SI, with no intent or plan, but she was listening to a song on the radio that made her think of that. Patient reports that she does not want to harm herself, she just thinks about what that would be like "from time to time." Patient reviewed information to contact various crisis lines in the case of an emergency. Patient  also received information for the Warm Line and Al-Anon for support, patient requested the Al- Anon information at the end of the session and reports that she feels that is her husbands "weakness." Patient denies SI/HI and psychosis. Patient endorsed that that understood the no harm contract and that she would utilize this if needed.   Suicidal/Homicidal: Nowithout intent/plan  Therapist Response: LCSW assessed the patient for recent and current symptoms of depression.  Patient inquired about the patients support system and familial relationships.  LCSW listened actively and asked clarifying questions.  LCSW provided reflective statements and encouraged the patient to be honest about her feelings with her family members. LCSW assessed the patients relationships and her thoughts about her familial relationships. LCSW reviewed emergency numbers with the patient.  LCSW assessed the patient for SI/HI and psychosis. LCSW explored and discussed the patients thoughts and views of "the other side" and assessed if the patient had these thoughts currently and asked if the patient understood the no harm contract.    Plan: Return again in 1 weeks, continue to take medication as prescribed, keep appointments as scheduled and utilize the emergency numbers if she has thoughts of self harm.    Diagnosis:  Major Depression, Recurrent episode, moderate        Yitta Gongaware, Mindi Curling, LCSW 07/09/2014

## 2014-07-09 NOTE — Progress Notes (Signed)
  PROCEDURE RECORD [facility]   Name: [name] DOB:[birth year] MRN: [id number]  Date:07/09/2014  Physician: Claudette Laws, MD    Nurse/CMA: Shebra Muldrow CMA  Allergies:  Allergies  Allergen Reactions  . Cimetidine Other (See Comments)    Breast swelling  . Indomethacin Diarrhea    Caused severe diarrhea with episodes of incontinance  . Penicillins Hives and Itching    Other reaction(s): Delusions (intolerance)  . Indomethacin Other (See Comments)    Severe diarrhea  . Sulfonamide Derivatives     unknown    Consent Signed: Yes.    Is patient diabetic? Yes.    CBG today? 178  Pregnant: No. LMP: No LMP recorded. Patient has had a hysterectomy. (age 82-55)  Anticoagulants: no Anti-inflammatory: no Antibiotics: no  Procedure:  Position: Prone Start Time: 231 End Time: 241Fluoro Time: 31  RN/CMA Rodricus Candelaria CMA Django Nguyen CMA    Time 151 246    BP 145/87 140/72    Pulse 95 93    Respirations 16 16    O2 Sat 95 97    S/S 6 6    Pain Level 4/10 0/10     D/C home with [place], patient A & O X 3, D/C instructions reviewed, and sits independently.

## 2014-07-09 NOTE — Patient Instructions (Signed)

## 2014-07-11 ENCOUNTER — Encounter: Payer: Self-pay | Admitting: Physical Medicine & Rehabilitation

## 2014-07-16 ENCOUNTER — Ambulatory Visit (INDEPENDENT_AMBULATORY_CARE_PROVIDER_SITE_OTHER): Payer: 59 | Admitting: Licensed Clinical Social Worker

## 2014-07-16 DIAGNOSIS — F330 Major depressive disorder, recurrent, mild: Secondary | ICD-10-CM

## 2014-07-16 NOTE — Progress Notes (Signed)
   THERAPIST PROGRESS NOTE  Session Time: 9:06AM-10:04AM  Participation Level: Active  Behavioral Response: Neat and Well GroomedAlertAnxious  Type of Therapy: Individual Therapy  Treatment Goals addressed: Anxiety and Coping Diminished interest in or enjoyment of activities  Interventions: Motivational Interviewing, Strength-based and Supportive Reinforce social activities and verbalization of patient's feelings needs and desires.   Summary: [name] is a 63 y.o. female who presents with symptoms of anxiety and depression. Patient reports that she has not been feeling as anxious since her medication has been adjusted. Patient reports that she has been pacing back and forth and she feels that may be a result of the medication change. Patient reports that she has not had symptoms of depression, but reports some sadness. Patient reports that she completed her homework assignment from last week and she reached out to a church member via telephone and she read verses in her Bible. Patient discussed how rekindling the relationship  Made her feel more positive and how she would like to continue to do that and plan activities out with her church friend. Patient reports that she enjoyed reading her Bible and she would like to work on reading the same verses in her study Bible.  Patient discussed her communication concerns with her husband.  Patient discussed planned vacations with her husband and how she plans to communicate on the vacation and that a vacation would be good for their relationship. Patient discussed her relationship with various family members and how she would like to surround herself with positivity.  Patient denies SI/HI and psychosis at the time of the assessment.  .   Suicidal/Homicidal: Nowithout intent/plan  Therapist Response:   LCSW assessed patients symptoms of anxiety and depression. LCSW assessed the completion of the patients homework and reviewed the results. LCSW assigned  homework of reaching out to church members and going to participate in social activities. LCSW also assigned the patient to read in her Study Bible. LCSW processed the feelings associated with the patient being active socially and working towards the patient's goal of reading her Bible regularly. LCSW discussed the importance of clear communication in her relationship and role played various methods of communication to use during her current vacation. LCSW listened actively, asked questions, and provided encouraging feedback as needed.   Plan: Return again in 2 weeks, complete homework as assigned. Continue to keep scheduled appointments, utilize mobile crisis in a mental health emergency. Take medication as prescribed.   Diagnosis:  Major Depression, Recurrent mild       Genice Rouge, LCSW 07/16/2014

## 2014-07-26 ENCOUNTER — Telehealth: Payer: Self-pay | Admitting: *Deleted

## 2014-07-26 NOTE — Telephone Encounter (Signed)
Pt left a message to call her -  NA 5PM [phone] on 07/25/14 and left message on ID recording. [name]. Called [phone] at 11:40AM on 07/26/14  NA - left message on home phone ID recording.[name] KidneyDebra Elenie Coven RN 07/26/14 11:45AM

## 2014-07-30 ENCOUNTER — Ambulatory Visit (INDEPENDENT_AMBULATORY_CARE_PROVIDER_SITE_OTHER): Payer: 59 | Admitting: Licensed Clinical Social Worker

## 2014-07-30 DIAGNOSIS — F330 Major depressive disorder, recurrent, mild: Secondary | ICD-10-CM

## 2014-07-30 NOTE — Progress Notes (Signed)
THERAPIST PROGRESS NOTE  Session Time: 1:09PM-2:11PM  Participation Level: Active  Behavioral Response: Neat and Well GroomedAlertAnxious and Depressed  Type of Therapy: Individual Therapy  Treatment Goals addressed: Anger and Communication: with her spouse address the diminished interest in or enjoyment of activities  Interventions: Motivational Interviewing, Strength-based and Supportive Reinforce social activities and verbalization of patient's feelings needs and desires.   Summary: [name] is a 63 y.o. female who presents with symptoms of depression and anxiety. Patient was tearful during the majority of the session and reported that she was not having a good day. Patient reports that she read her bible verse and she plans to speak with her friend from church later today, but she hasn't spoke due to lack of time and interest. Patient reports that she communicated her expectations with her husband about her birthday and he was receptive of her expectations. Patient discussed her ongoing communication problems with her husband. Patient reports that she does not feel that her husband is considerate and she feels that he complains too often. Patient reports that her husband has accumulated more bills although they are on a fixed income and she feels overwhelmed. Patient reports that she enjoyed her vacations but she feels that they have other priorities. Patient reports that she has not been feeling well physically and has been attending her physicians appointments, but she has been anxious about a surgery that she is having that will lead to a lack of use of her right hand. Patient reports that she feels that she will have to depend on her husband and discussed how this has strained their relationship in the past. Patient reports that she vowed to make her marriage work and she wants to put in the effort, but expressed the difficulty she experiences. Patient reports that she has not been  attending church or communicating with other people because she does not "want to go behind his back and talk about him" referring to her husband. Patient reports that she will attempt to be more social, but will not discuss her relationship problems with anyone. Patient reports that she will discuss the option for therapy with her husband and bring a list of things that she would like to address to the next session. Patient reports that she is not sure that he will be open to therapy, but she will ask. Patient denies SI/HI and psychosis. Patient reports that she has the information for Mobile Crisis in the case of a mental health emergency.   Suicidal/Homicidal: Nowithout intent/plan  Therapist Response: LCSW followed-up with the patient regarding her homework from the last session and provided positive feedback. LCSW discussed how the homework plays a role in accomplishing her goals and the importance of identifying social support and hobbies while coping with depression. LCSW assessed the patient for her current symptoms and suggested the patient speak with a physician if she is not feeling well physically. LCSW listened actively to the patient discuss her relationship and communication difficulty with her husband and asked probing and clarifying questions.  LCSW and the patient to identify her goals for the relationship. LCSW listened actively and provided positive feedback. LCSW acknowledged and validated the patients emotions related to the lack of communication with her husband. LCSW reinforced social activities and interests. LCSW provided the patient with a list of coping skills for her to review and try when she becomes irritated, depressed, or angry.  LCSW encouraged the patient to continue to engage with others as a part of  her homework. LCSW discussed the options of couples therapy with the patient and informed her that they could continue to discuss this in detail during the next session. LCSW  encouraged the patient to write a list of things that she would like to discuss with her husband in couples therapy to discuss during the next session.   Plan: Return again in 1 weeks, continue to take medications as prescribed, keep all scheduled appointments and schedule medical appointments as needed, contact mobile crisis in the vase of a mental health emergency. Review list of coping skills, continue to read one bible verse and communicate with at least one positive friend from church per week. Provide a list of things that she would like to discuss in couples therapy to the next session.   Diagnosis:  Major Depression, Recurrent mild       Genice RougeHerbin, Gini Caputo M, LCSW 07/30/2014

## 2014-08-02 ENCOUNTER — Encounter: Payer: Self-pay | Admitting: Cardiology

## 2014-08-02 ENCOUNTER — Ambulatory Visit (INDEPENDENT_AMBULATORY_CARE_PROVIDER_SITE_OTHER): Payer: PRIVATE HEALTH INSURANCE | Admitting: Cardiology

## 2014-08-02 VITALS — BP 112/78 | HR 76 | Ht 65.0 in | Wt 194.0 lb

## 2014-08-02 DIAGNOSIS — E785 Hyperlipidemia, unspecified: Secondary | ICD-10-CM

## 2014-08-02 DIAGNOSIS — I25119 Atherosclerotic heart disease of native coronary artery with unspecified angina pectoris: Secondary | ICD-10-CM

## 2014-08-02 DIAGNOSIS — I10 Essential (primary) hypertension: Secondary | ICD-10-CM

## 2014-08-02 NOTE — Patient Instructions (Signed)
We will schedule you for a nuclear stress test.  Continue your current medication   

## 2014-08-02 NOTE — Progress Notes (Signed)
[name] Date of Birth: July 07, 1951 Medical Record #[id number]  History of Present Illness: [name] is seen at the request of Dr. Dion SaucierLandau for pre op clearance for carpel tunnel surgery. She is a pleasant 63 yo BF with history of IDDM, HTN, HL, and coronary artery disease. She has a history of chronic chest pain. She underwent cardiac cath in 1/11 that showed diffuse nonobstructive CAD. This included a 60% stenosis in the ostial second diagonal, 40-50% mid LCx, and 50% proximal to mid RCA. The PLOM had 60-70% disease. She has been managed medically. In 1/12 she had a Lexiscan myoview study that was normal. She reports that in Jan this year she had intense chest pain occuring every day. Since then she has had intermittent chest pain. This is localized to the mid sternum. It is heavy without radiation. No diaphoresis or SOB. She takes pain medication and it will usually go away in 30 minutes. Pain typically occurs at rest and is not associated with activity. She does report a history of reflux but thinks those symptoms are different.     Medication List       This list is accurate as of: 08/02/14  1:11 PM.  Always use your most recent med list.               ACCU-CHEK FASTCLIX LANCETS Misc     acetaminophen-codeine 300-60 MG per tablet  Commonly known as:  TYLENOL #4  Take 1 tablet by mouth 3 (three) times daily.     aspirin 81 MG chewable tablet  Chew 81 mg by mouth daily.     BD PEN NEEDLE NANO U/F 32G X 4 MM Misc  Generic drug:  Insulin Pen Needle  USE TO TEST UP TO 5 TIMES A DAY ACCORDING TO CBG     buPROPion 150 MG 24 hr tablet  Commonly known as:  WELLBUTRIN XL  Take 3 tablets (450 mg total) by mouth every morning.     busPIRone 10 MG tablet  Commonly known as:  BUSPAR  1 qam 1  q dinner  With food for 1 week then 1 qam 2 qpm with dinner therafter     cloNIDine 0.2 mg/24hr patch  Commonly known as:  CATAPRES - Dosed in mg/24 hr  PLACE 1 PATCH ONTO THE SKIN EVERY 7 DAYS      COLACE PO  Take 2 tablets by mouth daily as needed (constipation).     DEXILANT 60 MG capsule  Generic drug:  dexlansoprazole  Take 60 mg by mouth daily.     furosemide 40 MG tablet  Commonly known as:  LASIX  Take 1 tablet (40 mg total) by mouth every morning.     gabapentin 800 MG tablet  Commonly known as:  NEURONTIN  Take 1 tablet (800 mg total) by mouth as directed.     glucose blood test strip  Commonly known as:  ACCU-CHEK AVIVA PLUS  Check blood sugar up to 4 times a day as instructed     insulin aspart 100 UNIT/ML injection  Commonly known as:  novoLOG  Inject into the skin 3 (three) times daily with meals. Take as directed by your aviva glucometer.     Insulin Glargine 100 UNIT/ML Solostar Pen  Commonly known as:  LANTUS SOLOSTAR  Inject 30 Units into the skin daily at 10 pm.     isosorbide mononitrate 60 MG 24 hr tablet  Commonly known as:  IMDUR  Take 1 tablet (60 mg  total) by mouth every morning.     losartan 100 MG tablet  Commonly known as:  COZAAR  Take 50 mg by mouth every morning.     metoprolol succinate 25 MG 24 hr tablet  Commonly known as:  TOPROL-XL  Take 1 tablet (25 mg total) by mouth every morning.     ondansetron 4 MG tablet  Commonly known as:  ZOFRAN     rosuvastatin 10 MG tablet  Commonly known as:  CRESTOR  Take 1 tablet (10 mg total) by mouth at bedtime.     senna 8.6 MG Tabs tablet  Commonly known as:  SENOKOT  Take 4 tablets by mouth daily as needed (constipation).         Allergies  Allergen Reactions  . Cimetidine Other (See Comments)    Breast swelling  . Indomethacin Diarrhea    Caused severe diarrhea with episodes of incontinance  . Penicillins Hives and Itching    Other reaction(s): Delusions (intolerance)  . Indomethacin Other (See Comments)    Severe diarrhea  . Sulfonamide Derivatives     unknown    Past Medical History  Diagnosis Date  . Hypertension   . Peripheral neuropathy   . Cervicalgia   .  Low back pain syndrome   . Hyperlipidemia   . Vitamin D deficiency   . Vitamin B12 deficiency   . Iliotibial band syndrome   . Anemia   . Borderline personality disorder   . Nonorganic psychosis   . GERD (gastroesophageal reflux disease)   . Diabetes mellitus   . Depression   . Diabetic gastroparesis   . CAD (coronary artery disease)     Catherization 11/18/09>nonobstructive CAD , normal LV function, EF 65%  . Health maintenance examination     Colonoscopy 2009> normal; Diabetic eye exam 12/2008. No diabetic retinopathy; Mammogram 11/10> No evidence of malignancy, DXA 03/14/13 : normal  . Headache(784.0)   . Arthritis   . SBO (small bowel obstruction) 01/09/2013  . Diastolic CHF     Grade I, on Echo 06/2013, EF 55-60%  . Sleep apnea     does not wear CPAP   . Diabetes mellitus, type II     Past Surgical History  Procedure Laterality Date  . Abdominal hysterectomy    . Hand surgery    . Bowel resection N/A 01/10/2013    Procedure: SMALL BOWEL RESECTION;  Surgeon: Lodema Pilot, DO;  Location: [facility] OR;  Service: General;  Laterality: N/A;  . Lysis of adhesion N/A 01/10/2013    Procedure: LYSIS OF ADHESION;  Surgeon: Lodema Pilot, DO;  Location: [facility] OR;  Service: General;  Laterality: N/A;  . Laparotomy N/A 01/10/2013    Procedure: EXPLORATORY LAPAROTOMY;  Surgeon: Lodema Pilot, DO;  Location: [facility] OR;  Service: General;  Laterality: N/A;  . Incisional hernia repair N/A 03/01/2014    Procedure: LAPAROSCOPIC INCISIONAL HERNIA;  Surgeon: Axel Filler, MD;  Location: [facility] OR[facility];  Service: General;  Laterality: N/A;  . Insertion of mesh N/A 03/01/2014    Procedure: INSERTION OF MESH;  Surgeon: Axel Filler, MD;  Location: [facility];  Service: General;  Laterality: N/A;  . Hernia repair  03/01/14    Lap incisional hernia repair w/mesh    History   Social History  . Marital Status: Married    Spouse Name: N/A    Number of Children: N/A  . Years of Education: N/A   Social History Main Topics    . Smoking status: Former Smoker  Types: Cigarettes    Quit date: 10/25/2002  . Smokeless tobacco: Never Used  . Alcohol Use: No  . Drug Use: No  . Sexual Activity: Yes   Other Topics Concern  . None   Social History Narrative  . None    Family History  Problem Relation Age of Onset  . Diabetes Mother   . Hypertension Mother   . Hyperlipidemia Mother   . Arthritis Mother   . Depression Mother   . Heart disease Father   . Diabetes Sister   . Diabetes Brother   . Heart disease Brother   . Heart disease Paternal Grandmother   . Seizures Brother     Review of Systems: The review of systems is positive for history of GERD.  All other systems were reviewed and are negative.  Physical Exam: BP 112/78  Pulse 76  Ht 5\' 5"  (1.651 m)  Wt 194 lb (87.998 kg)  BMI 32.28 kg/m2 Filed Weights   08/02/14 1046  Weight: 194 lb (87.998 kg)  GENERAL:  Well appearing, obese BF in NAD HEENT:  PERRL, EOMI, sclera are clear. Oropharynx is clear. NECK:  No jugular venous distention, carotid upstroke brisk and symmetric, no bruits, no thyromegaly or adenopathy LUNGS:  Clear to auscultation bilaterally CHEST:  Unremarkable HEART:  RRR,  PMI not displaced or sustained,S1 and S2 within normal limits, no S3, no S4: no clicks, no rubs, no murmurs ABD:  Soft, nontender. BS +, no masses or bruits. No hepatomegaly, no splenomegaly EXT:  2 + pulses throughout, no edema, no cyanosis no clubbing SKIN:  Warm and dry.  No rashes NEURO:  Alert and oriented x 3. Cranial nerves II through XII intact. PSYCH:  Cognitively intact    LABORATORY DATA: Lab Results  Component Value Date   WBC 7.0 02/25/2014   HGB 10.7* 02/25/2014   HCT 34.1* 02/25/2014   PLT 263 02/25/2014   GLUCOSE 324* 04/10/2014   CHOL 252* 07/19/2013   TRIG 352* 07/19/2013   HDL 44 07/19/2013   LDLDIRECT 60 06/16/2012   LDLCALC 138* 07/19/2013   ALT 14 04/10/2014   AST 15 04/10/2014   NA 137 04/10/2014   K 5.2 04/10/2014   CL 101  04/10/2014   CREATININE 1.61* 04/10/2014   BUN 31* 04/10/2014   CO2 22 04/10/2014   TSH 2.580 01/17/2014   INR 0.96 11/17/2009   HGBA1C 9.2 04/10/2014   MICROALBUR 5.59* 10/29/2013   Ecg: 06/18/14: normal.    Assessment / Plan: 1. Chest pain with atypical features. Suspect a component of musculoskeletal pain given relief with pain meds. She does have known CAD and last evaluation 2/12. I have recommended a Lexiscan myoview to stratify her cardiac risk. If normal she will be cleared for carpel tunnel surgery.  I would continue medical therapy with ASA, isosorbide, metoprolol an statin. If stress test is abnormal she would need cardiac cath.   2. IDDM  3. HTN controlled.   4. Hyperlipidemia. No lipid panel recorded in over one year. On Crestor. Goal LDL less than 70.

## 2014-08-05 ENCOUNTER — Telehealth: Payer: Self-pay | Admitting: Dietician

## 2014-08-05 NOTE — Telephone Encounter (Signed)
Patient calls and says her blood sugars are much higher in 200 and 300s more opften. She says her accu chek meter is not working for her anymore. Getting snacks in between meals and not taking insulin, nor taking insulin in between meals when blood sugars are high post meal and amounts she is taking to correct at meals does not seem to be working. Blood sugar was ~300 before her lunch today she ate 60 carbs, and her blood sugar is  357 now and she has not eaten anything.  She agreed to try taking more insulin in between meals to address high sugars and snacks for a few days and we'd tough base again Thursday Note her weight is stable, not recent A1C, she may need appointment to sort out cause of high blood sugar.

## 2014-08-06 ENCOUNTER — Other Ambulatory Visit: Payer: Self-pay | Admitting: *Deleted

## 2014-08-06 ENCOUNTER — Ambulatory Visit (HOSPITAL_BASED_OUTPATIENT_CLINIC_OR_DEPARTMENT_OTHER): Payer: PRIVATE HEALTH INSURANCE | Admitting: Physical Medicine & Rehabilitation

## 2014-08-06 ENCOUNTER — Encounter: Payer: Self-pay | Admitting: Physical Medicine & Rehabilitation

## 2014-08-06 ENCOUNTER — Ambulatory Visit (INDEPENDENT_AMBULATORY_CARE_PROVIDER_SITE_OTHER): Payer: 59 | Admitting: Licensed Clinical Social Worker

## 2014-08-06 ENCOUNTER — Encounter: Payer: PRIVATE HEALTH INSURANCE | Attending: Physical Medicine & Rehabilitation

## 2014-08-06 VITALS — BP 154/82 | HR 81 | Resp 14 | Ht 65.0 in | Wt 193.0 lb

## 2014-08-06 DIAGNOSIS — M47816 Spondylosis without myelopathy or radiculopathy, lumbar region: Secondary | ICD-10-CM | POA: Insufficient documentation

## 2014-08-06 DIAGNOSIS — F330 Major depressive disorder, recurrent, mild: Secondary | ICD-10-CM

## 2014-08-06 NOTE — Progress Notes (Addendum)
   THERAPIST PROGRESS NOTE  Session Time: 9:20AM - 10:05  Participation Level: Active  Behavioral Response: Neat and Well GroomedAlertAnxious and Irritable  Type of Therapy: Individual Therapy  Treatment Goals addressed: Coping gaining interest in or enjoyment of activities Communication with her husband  Interventions: CBT, Motivational Interviewing, Strength-based and Supportive   Summary: [name] is a 63 y.o. female who presents with symptoms of depression and anxious. Patient reports that she has been feeling more stable lately and she has been using some of her coping skills. Patient reports that she has not been reading in her study Bible, but she has been engaging with her friends from church in person and over the phone. Patient processed how she feels these friendships have been helpful. Patient reports that she continues to experience difficulty communicating with her husband. Patient processed the things that she would discuss with him in marriage counseling and how those things affect her on a daily basis. Patient reports that her husbands drinking bothers her the most and she reports that she would like to attend an [employer] session this week. Patient reports that she is hoping to gain support for the group. Patient began to work on her "I statements" when discussing the things that she wanted to address with her husband and took the rest of them home to complete for homework. Patient denies SI/HI and psychosis.   Suicidal/Homicidal: Nowithout intent/plan  Therapist Response: LCSW assessed the patient and her current symptoms as well as the coping skills used since last session. LCSW followed up with the patient regarding her homewwork to develop interest in things that she once enjoyed. LCSW asked the patient to review her homework regarding the  Various topics she would discuss with her husband during a family session. LCSW assisted the patient with reshaping those topics into "I  statements." LCSW listened actively and discussed the pros of attending the [employer] meeting. LCSW provided positive feedback and asked engaging questions.    Plan: Return again in 2 weeks, continue to take medications as prescribed, keep scheduled appointments, continue developing "I statements" attend [employer] meetings. Utilize mobile crisis in the case of a mental health emergency.   Diagnosis:  Major Depression, recurrent episode, mild       Genice RougeHerbin, Eiko Mcgowen M, LCSW 08/06/2014

## 2014-08-06 NOTE — Progress Notes (Signed)
Subjective:    Patient ID: [name], female    DOB: 1950/12/02, 63 y.o.   MRN: [id number] CC low back pain HPI 07/09/2014 Bilateral MBB L3,4,5 resulted in 50% pain relief ~3wk relief until last Friday when caring for 2.5 yo grandson. Awaiting cardiac clearance for right carpal tunnel release  Patient is awaiting date for carpal tunnel release. Has had bilateral EMGs. Left hand looks more like neuropathy according to patient's report the Pain Inventory Average Pain 4 Pain Right Now 0 My pain is intermittent and pulling  In the last 24 hours, has pain interfered with the following? General activity 0 Relation with others 0 Enjoyment of life 0 What TIME of day is your pain at its worst? Depends on activity Sleep (in general) Fair  Pain is worse with: some activites Pain improves with: rest, medication and injections Relief from Meds: 2  Mobility walk without assistance  Function Disabled 1999  Neuro/Psych numbness tingling depression anxiety  Prior Studies Any changes since last visit?  no  Physicians involved in your care Any changes since last visit?  no   Family History  Problem Relation Age of Onset  . Diabetes Mother   . Hypertension Mother   . Hyperlipidemia Mother   . Arthritis Mother   . Depression Mother   . Heart disease Father   . Diabetes Sister   . Diabetes Brother   . Heart disease Brother   . Heart disease Paternal Grandmother   . Seizures Brother    History   Social History  . Marital Status: Married    Spouse Name: N/A    Number of Children: N/A  . Years of Education: N/A   Social History Main Topics  . Smoking status: Former Smoker    Types: Cigarettes    Quit date: 10/25/2002  . Smokeless tobacco: Never Used  . Alcohol Use: No  . Drug Use: No  . Sexual Activity: Yes   Other Topics Concern  . None   Social History Narrative  . None   Past Surgical History  Procedure Laterality Date  . Abdominal hysterectomy    .  Hand surgery    . Bowel resection N/A 01/10/2013    Procedure: SMALL BOWEL RESECTION;  Surgeon: Lodema PilotBrian Layton, DO;  Location: [facility] OR;  Service: General;  Laterality: N/A;  . Lysis of adhesion N/A 01/10/2013    Procedure: LYSIS OF ADHESION;  Surgeon: Lodema PilotBrian Layton, DO;  Location: [facility] OR;  Service: General;  Laterality: N/A;  . Laparotomy N/A 01/10/2013    Procedure: EXPLORATORY LAPAROTOMY;  Surgeon: Lodema PilotBrian Layton, DO;  Location: [facility] OR;  Service: General;  Laterality: N/A;  . Incisional hernia repair N/A 03/01/2014    Procedure: LAPAROSCOPIC INCISIONAL HERNIA;  Surgeon: Axel FillerArmando Ramirez, MD;  Location: [facility];  Service: General;  Laterality: N/A;  . Insertion of mesh N/A 03/01/2014    Procedure: INSERTION OF MESH;  Surgeon: Axel FillerArmando Ramirez, MD;  Location: [facility];  Service: General;  Laterality: N/A;  . Hernia repair  03/01/14    Lap incisional hernia repair w/mesh   Past Medical History  Diagnosis Date  . Hypertension   . Peripheral neuropathy   . Cervicalgia   . Low back pain syndrome   . Hyperlipidemia   . Vitamin D deficiency   . Vitamin B12 deficiency   . Iliotibial band syndrome   . Anemia   . Borderline personality disorder   . Nonorganic psychosis   . GERD (gastroesophageal reflux disease)   .  Diabetes mellitus   . Depression   . Diabetic gastroparesis   . CAD (coronary artery disease)     Catherization 11/18/09>nonobstructive CAD , normal LV function, EF 65%  . Health maintenance examination     Colonoscopy 2009> normal; Diabetic eye exam 12/2008. No diabetic retinopathy; Mammogram 11/10> No evidence of malignancy, DXA 03/14/13 : normal  . Headache(784.0)   . Arthritis   . SBO (small bowel obstruction) 01/09/2013  . Diastolic CHF     Grade I, on Echo 06/2013, EF 55-60%  . Sleep apnea     does not wear CPAP   . Diabetes mellitus, type II    BP 154/82  Pulse 81  Resp 14  Ht 5\' 5"  (1.651 m)  Wt 193 lb (87.544 kg)  BMI 32.12 kg/m2  SpO2 97%  Opioid Risk Score:   Fall Risk Score:  Low Fall Risk (0-5 points)  Review of Systems     Objective:   Physical Exam  No tenderness to palpation in the lumbar paraspinal muscles. Reduced range of motion with flexion of the lumbar spine, extension is full however pain with and range. Side bending is 75% and painful at end range  Negative straight leg raising Normal sensation in the lower extremities Decreased sensation right median nerve distribution     Assessment & Plan:  1. Lumbar spondylosis discussed radiofrequency procedure would start with left-sided low back Then one month later to the right side. We discussed the typical timeframe of improvement. Also approximately one month recovery time.  We will wait until after the carpal tunnel release so this will be sometime in December  Post carpal tunnel release orthopedic surgeon will prescribe pain medication and once adequate healing of the surgery is achieved we can resume usual pain medication

## 2014-08-06 NOTE — Patient Instructions (Signed)
.   Lumbar spondylosis discussed radiofrequency procedure would start with left-sided low back Then one month later to the right side. We discussed the typical timeframe of improvement. Also approximately one month recovery time.  We will wait until after the carpal tunnel release so this will be sometime in December  Post carpal tunnel release orthopedic surgeon will prescribe pain medication and once adequate healing of the surgery is achieved we can resume usual pain medication

## 2014-08-07 MED ORDER — CLONIDINE HCL 0.2 MG/24HR TD PTWK: MEDICATED_PATCH | TRANSDERMAL | Status: DC

## 2014-08-07 MED ORDER — ACCU-CHEK FASTCLIX LANCETS MISC: Status: DC

## 2014-08-09 ENCOUNTER — Telehealth (HOSPITAL_COMMUNITY): Payer: Self-pay

## 2014-08-09 NOTE — Telephone Encounter (Signed)
Encounter complete. 

## 2014-08-13 ENCOUNTER — Ambulatory Visit (INDEPENDENT_AMBULATORY_CARE_PROVIDER_SITE_OTHER): Payer: 59 | Admitting: Licensed Clinical Social Worker

## 2014-08-13 DIAGNOSIS — F330 Major depressive disorder, recurrent, mild: Secondary | ICD-10-CM

## 2014-08-13 NOTE — Progress Notes (Signed)
THERAPIST PROGRESS NOTE  Session Time: 9:04AM - 10:00AM  Participation Level: Active  Behavioral Response: Neat and Well GroomedAlertAnxious  Type of Therapy: Individual Therapy  Treatment Goals addressed: Communication: With her husband, how miscommunication can lead to anger and then depression Recognizing and understanding the symptoms of depression  Interventions: CBT, Motivational Interviewing and Strength-based  Summary: [name] is a 63 y.o. female who presents with some symptoms of depression. Patient reports that she has been keeping her scheduled appointments in reference to her physical pain and she is feeling much better. Patient reports that her feelings of anxiety have reduced significantly since her appointment with her Psychiatrist and anxiety is not a problem at this time. Patient discussed her ongoing communication problems with her husband. Patient processed her feelings associated with the ongoing communication troubles with her husband. Patient reports that she is not attending any Al-An[employer] meetings because she feels that she should not have to continue to seek help for her husband's problems. Patient reports that as an alternative to her attending Al-Anon she will let her husband know that if he does not stop drinking or seek help, there "will be consequences" which include divorce. Patient discussed the connection between lack of communication and depression. Patient reviewed her homework and developed "I statements" that she would like to use while in a session with her husband. Patient processed the emotions related to the "I statements" as well as her feelings while writing the letters. Patient reports that she feels "stronger" now that she has completed the "I statements" although she felt weak while writing the statements  . Patient discussed her husband attending the next session and explored her fears. Patient role-played using her "I statements" how she could invite  her husband to therapy without him feeling threatened. Patient reports that in the past she has not had the ability to cope with her husband drinking and confronting him without engaging in self injurious behavior. Patient reports that she has not had thoughts of harming herself, and she has been able to use coping skills or walk away. Patient reports that she feels that better communication with help her convey her feelings without feelings of frustration. Patient denies SI/HI and Psychosis and reviewed a plan to contact 911 or go to the nearest emergency room if she experienced thoughts of harming herself.    Suicidal/Homicidal: Nowithout intent/plan  Therapist Response: LCSW assessed the patients symptoms and progress since last visit. LCSW followed up with patient regarding her anxiety concerns. LCSW followed-up with the patient regarding her ongoing troubles with communication with her husband. LCSW asked patient if she was using her coping skills. LCSW listened actively to the patient and validated her feelings of distress and anger as they relate to depression. LCSW followed up with the patient regarding her [employer] meetings and discussed the pros and cons of attending meetings. LCSW reviewed the patients homework and discussed the benefits of using "I statements" when communicating. LCSW role played with he patient using the statements until patient indicated that she was comfortable. LCSW assessed the possibility of the patient's husband coming in for a session and role played various ways to ask him to come into the session in a non threatening manner. LCSW processed with the patient  How different forms of communication can lead to different results. LCSW compared this form of communication with ways the patient and her husband have communicated in the past.  LCSW processed how the patient's husband drinking would impact the  relationship based on how it has impacted the relationship in the past.   LCSW reviewed a crisis plan with the patient in case she had thoughts of harming herself in the future. LCSW assigned homework based on healthy communication and instructed the patient to review the worksheet and answer the questions on the back.   Plan: Return again in 2 weeks, keep scheduled appointments, take medications as prescribed, and utilize crisis plan in the case of a mental health emergency.  [place] homework for the next session.   Diagnosis: Major Depression, Recurrent Episode, Mild       Zoe Goonan, Mindi CurlingJoVea M, LCSW 08/13/2014

## 2014-08-14 ENCOUNTER — Encounter: Payer: Self-pay | Admitting: Internal Medicine

## 2014-08-14 ENCOUNTER — Ambulatory Visit (HOSPITAL_COMMUNITY)
Admission: RE | Admit: 2014-08-14 | Discharge: 2014-08-14 | Disposition: A | Discharge status: Home / Self Care | Payer: PRIVATE HEALTH INSURANCE | Source: Ambulatory Visit | Attending: Internal Medicine | Admitting: Internal Medicine

## 2014-08-14 ENCOUNTER — Ambulatory Visit (HOSPITAL_COMMUNITY)
Admission: RE | Admit: 2014-08-14 | Discharge: 2014-08-14 | Disposition: A | Discharge status: Home / Self Care | Payer: PRIVATE HEALTH INSURANCE | Source: Ambulatory Visit | Attending: Cardiovascular Disease | Admitting: Cardiovascular Disease

## 2014-08-14 ENCOUNTER — Ambulatory Visit (INDEPENDENT_AMBULATORY_CARE_PROVIDER_SITE_OTHER): Payer: PRIVATE HEALTH INSURANCE | Admitting: Internal Medicine

## 2014-08-14 VITALS — BP 154/81 | HR 71 | Temp 97.7°F | Wt 198.7 lb

## 2014-08-14 DIAGNOSIS — R0609 Other forms of dyspnea: Secondary | ICD-10-CM | POA: Insufficient documentation

## 2014-08-14 DIAGNOSIS — I10 Essential (primary) hypertension: Secondary | ICD-10-CM

## 2014-08-14 DIAGNOSIS — M797 Fibromyalgia: Secondary | ICD-10-CM

## 2014-08-14 DIAGNOSIS — I2510 Atherosclerotic heart disease of native coronary artery without angina pectoris: Secondary | ICD-10-CM | POA: Insufficient documentation

## 2014-08-14 DIAGNOSIS — E785 Hyperlipidemia, unspecified: Secondary | ICD-10-CM

## 2014-08-14 DIAGNOSIS — E663 Overweight: Secondary | ICD-10-CM | POA: Diagnosis not present

## 2014-08-14 DIAGNOSIS — R079 Chest pain, unspecified: Secondary | ICD-10-CM | POA: Insufficient documentation

## 2014-08-14 DIAGNOSIS — Z794 Long term (current) use of insulin: Secondary | ICD-10-CM | POA: Insufficient documentation

## 2014-08-14 DIAGNOSIS — IMO0002 Reserved for concepts with insufficient information to code with codable children: Secondary | ICD-10-CM

## 2014-08-14 DIAGNOSIS — J449 Chronic obstructive pulmonary disease, unspecified: Secondary | ICD-10-CM | POA: Diagnosis not present

## 2014-08-14 DIAGNOSIS — M159 Polyosteoarthritis, unspecified: Secondary | ICD-10-CM | POA: Insufficient documentation

## 2014-08-14 DIAGNOSIS — I25119 Atherosclerotic heart disease of native coronary artery with unspecified angina pectoris: Secondary | ICD-10-CM

## 2014-08-14 DIAGNOSIS — Z8249 Family history of ischemic heart disease and other diseases of the circulatory system: Secondary | ICD-10-CM | POA: Insufficient documentation

## 2014-08-14 DIAGNOSIS — E119 Type 2 diabetes mellitus without complications: Secondary | ICD-10-CM | POA: Insufficient documentation

## 2014-08-14 DIAGNOSIS — E1165 Type 2 diabetes mellitus with hyperglycemia: Secondary | ICD-10-CM

## 2014-08-14 LAB — LIPID PANEL
Cholesterol: 129 mg/dL (ref 0–200)
HDL: 37 mg/dL — ABNORMAL LOW (ref >39)
LDL CALC: 31 mg/dL (ref 0–99)
Total CHOL/HDL Ratio: 3.5 Ratio
Triglycerides: 305 mg/dL — ABNORMAL HIGH (ref <150)
VLDL: 61 mg/dL — ABNORMAL HIGH (ref 0–40)

## 2014-08-14 LAB — POCT GLYCOSYLATED HEMOGLOBIN (HGB A1C): HEMOGLOBIN A1C: 9.5

## 2014-08-14 LAB — GLUCOSE, CAPILLARY: Glucose-Capillary: 313 mg/dL — ABNORMAL HIGH (ref 70–99)

## 2014-08-14 MED ORDER — TECHNETIUM TC 99M SESTAMIBI GENERIC - CARDIOLITE
30.3000 | Freq: Once | INTRAVENOUS | Status: AC | PRN
  Administered 2014-08-14: 30.3 via INTRAVENOUS

## 2014-08-14 MED ORDER — REGADENOSON 0.4 MG/5ML IV SOLN
0.4000 mg | Freq: Once | INTRAVENOUS | Status: AC
  Administered 2014-08-14: 0.4 mg via INTRAVENOUS

## 2014-08-14 MED ORDER — INSULIN GLARGINE 100 UNIT/ML SOLOSTAR PEN: 35.0000 [IU] | PEN_INJECTOR | Freq: Every day | SUBCUTANEOUS | Status: DC

## 2014-08-14 MED ORDER — TECHNETIUM TC 99M SESTAMIBI GENERIC - CARDIOLITE
10.8000 | Freq: Once | INTRAVENOUS | Status: AC | PRN
  Administered 2014-08-14: 11 via INTRAVENOUS

## 2014-08-14 MED ORDER — AMINOPHYLLINE 25 MG/ML IV SOLN
75.0000 mg | Freq: Once | INTRAVENOUS | Status: AC
  Administered 2014-08-14: 75 mg via INTRAVENOUS

## 2014-08-14 NOTE — Assessment & Plan Note (Addendum)
Pt currently on 35 units of lantus in PM and on a SS novolog with meals.   -check HA1c and lipid panel today and follow up with PCP in December

## 2014-08-14 NOTE — Progress Notes (Signed)
Case discussed with Dr. Delane GingerGill soon after the resident saw the patient. We reviewed the resident's history and exam and pertinent patient test results. I agree with the assessment, diagnosis, and plan of care documented in the resident's note.  Elevated blood pressure may be from the heel pain.  Will address the pain as outlined in Dr. Shiela MayerGill's note, but if hypertension persists she will require escalation of her antihypertensive regimen.

## 2014-08-14 NOTE — Patient Instructions (Signed)
Thank you for your visit today.   Please return to the internal medicine clinic in December to see Dr. Vivianne Masterroung.      Your current medical regimen is effective;  continue present plan and take all medications as prescribed.   We will check your labs today and send you to have an x-ray of your right foot.   Please be sure to bring all of your medications with you to every visit; this includes herbal supplements, vitamins, eye drops, and any over-the-counter medications.   Should you have any questions regarding your medications and/or any new or worsening symptoms, please be sure to call the clinic at [phone].   If you believe that you are suffering from a life threatening condition or one that may result in the loss of limb or function, then you should call 911 or proceed to the nearest Emergency Department.     A healthy lifestyle and preventative care can promote health and wellness.   Maintain regular health, dental, and eye exams.  Eat a healthy diet. Foods like vegetables, fruits, whole grains, low-fat dairy products, and lean protein foods contain the nutrients you need without too many calories. Decrease your intake of foods high in solid fats, added sugars, and salt. Get information about a proper diet from your caregiver, if necessary.  Regular physical exercise is one of the most important things you can do for your health. Most adults should get at least 150 minutes of moderate-intensity exercise (any activity that increases your heart rate and causes you to sweat) each week. In addition, most adults need muscle-strengthening exercises on 2 or more days a week.   Maintain a healthy weight. The body mass index (BMI) is a screening tool to identify possible weight problems. It provides an estimate of body fat based on height and weight. Your caregiver can help determine your BMI, and can help you achieve or maintain a healthy weight. For adults 20 years and older:  A BMI  below 18.5 is considered underweight.  A BMI of 18.5 to 24.9 is normal.  A BMI of 25 to 29.9 is considered overweight.  A BMI of 30 and above is considered obese.

## 2014-08-14 NOTE — Assessment & Plan Note (Signed)
-  check lipid panel -continue crestor 10mg

## 2014-08-14 NOTE — Assessment & Plan Note (Signed)
-  continue gabapentin

## 2014-08-14 NOTE — Progress Notes (Signed)
Patient ID: [name], female   DOB: 1951-08-14, 63 y.o.   MRN: [id number]    Subjective:   Patient ID: [name] female    DOB: 1951-08-14 63 y.o.    MRN: [id number] Health Maintenance Due: Health Maintenance Due  Topic Date Due  . Influenza Vaccine  05/25/2014  . Hemoglobin A1c  07/11/2014  . Foot Exam  07/12/2014  . Lipid Panel  07/19/2014    _________________________________________________  HPI: [name] is a 63 y.o. female here for a acute visit.  Pt has a PMH outlined below.  Please see problem-based charting assessment and plan note for further details of medical issues addressed at today's visit.  PMH: Past Medical History  Diagnosis Date  . Hypertension   . Peripheral neuropathy   . Cervicalgia   . Low back pain syndrome   . Hyperlipidemia   . Vitamin D deficiency   . Vitamin B12 deficiency   . Iliotibial band syndrome   . Anemia   . Borderline personality disorder   . Nonorganic psychosis   . GERD (gastroesophageal reflux disease)   . Diabetes mellitus   . Depression   . Diabetic gastroparesis   . CAD (coronary artery disease)     Catherization 11/18/09>nonobstructive CAD , normal LV function, EF 65%  . Health maintenance examination     Colonoscopy 2009> normal; Diabetic eye exam 12/2008. No diabetic retinopathy; Mammogram 11/10> No evidence of malignancy, DXA 03/14/13 : normal  . Headache(784.0)   . Arthritis   . SBO (small bowel obstruction) 01/09/2013  . Diastolic CHF     Grade I, on Echo 06/2013, EF 55-60%  . Sleep apnea     does not wear CPAP   . Diabetes mellitus, type II     Medications: Current Outpatient Prescriptions on File Prior to Visit  Medication Sig Dispense Refill  . ACCU-CHEK FASTCLIX LANCETS MISC Use to Check Blood Sugars up to 4 Times a Day. ICD-10 Code: E11.65  102 each  6  . acetaminophen-codeine (TYLENOL #4) 300-60 MG per tablet Take 1 tablet by mouth 3 (three) times daily.  90 tablet  1  . aspirin 81 MG chewable  tablet Chew 81 mg by mouth daily.      . BD PEN NEEDLE NANO U/F 32G X 4 MM MISC USE TO TEST UP TO 5 TIMES A DAY ACCORDING TO CBG  100 each  4  . buPROPion (WELLBUTRIN XL) 150 MG 24 hr tablet Take 3 tablets (450 mg total) by mouth every morning.  90 tablet  5  . busPIRone (BUSPAR) 10 MG tablet 1 qam 1  q dinner  With food for 1 week then 1 qam 2 qpm with dinner therafter  90 tablet  5  . cloNIDine (CATAPRES - DOSED IN MG/24 HR) 0.2 mg/24hr patch PLACE 1 PATCH ONTO THE SKIN EVERY 7 DAYS  4 patch  2  . dexlansoprazole (DEXILANT) 60 MG capsule Take 60 mg by mouth daily.       Tery Sanfilippo. Docusate Sodium (COLACE PO) Take 2 tablets by mouth daily as needed (constipation).      . furosemide (LASIX) 40 MG tablet Take 1 tablet (40 mg total) by mouth every morning.  30 tablet  3  . gabapentin (NEURONTIN) 800 MG tablet Take 1 tablet (800 mg total) by mouth as directed.  90 tablet  11  . glucose blood (ACCU-CHEK AVIVA PLUS) test strip Check blood sugar up to 4 times a day as instructed  125 each  5  . insulin aspart (NOVOLOG) 100 UNIT/ML injection Inject into the skin 3 (three) times daily with meals. Take as directed by your aviva glucometer.      . Insulin Glargine (LANTUS SOLOSTAR) 100 UNIT/ML Solostar Pen Inject 30 Units into the skin daily at 10 pm.  15 mL  11  . isosorbide mononitrate (IMDUR) 60 MG 24 hr tablet Take 1 tablet (60 mg total) by mouth every morning.  30 tablet  6  . losartan (COZAAR) 100 MG tablet Take 50 mg by mouth every morning.      . metoprolol succinate (TOPROL-XL) 25 MG 24 hr tablet Take 1 tablet (25 mg total) by mouth every morning.  30 tablet  6  . ondansetron (ZOFRAN) 4 MG tablet       . rosuvastatin (CRESTOR) 10 MG tablet Take 1 tablet (10 mg total) by mouth at bedtime.  30 tablet  11  . senna (SENOKOT) 8.6 MG TABS Take 4 tablets by mouth daily as needed (constipation).       No current facility-administered medications on file prior to visit.    Allergies: Allergies  Allergen  Reactions  . Cimetidine Other (See Comments)    Breast swelling  . Indomethacin Diarrhea    Caused severe diarrhea with episodes of incontinance  . Penicillins Hives and Itching    Other reaction(s): Delusions (intolerance)  . Indomethacin Other (See Comments)    Severe diarrhea  . Sulfonamide Derivatives     unknown    FH: Family History  Problem Relation Age of Onset  . Diabetes Mother   . Hypertension Mother   . Hyperlipidemia Mother   . Arthritis Mother   . Depression Mother   . Heart disease Father   . Diabetes Sister   . Diabetes Brother   . Heart disease Brother   . Heart disease Paternal Grandmother   . Seizures Brother     SH: History   Social History  . Marital Status: Married    Spouse Name: N/A    Number of Children: N/A  . Years of Education: N/A   Social History Main Topics  . Smoking status: Former Smoker    Types: Cigarettes    Quit date: 10/25/2002  . Smokeless tobacco: Never Used  . Alcohol Use: No  . Drug Use: No  . Sexual Activity: Yes   Other Topics Concern  . Not on file   Social History Narrative  . No narrative on file    Review of Systems: Constitutional: Negative for fever, chills and weight loss.  Eyes: Negative for blurred vision.  Respiratory: Negative for cough and shortness of breath.  Cardiovascular: Negative for chest pain, palpitations and leg swelling.  Gastrointestinal: Negative for nausea, vomiting, abdominal pain, diarrhea, constipation and blood in stool.  Genitourinary: Negative for dysuria, urgency and frequency.  Musculoskeletal: Negative for myalgias and back pain.  Neurological: Negative for dizziness, weakness and headaches.     Objective:   Vital Signs: There were no vitals filed for this visit.    BP Readings from Last 3 Encounters:  08/06/14 154/82  08/02/14 112/78  07/09/14 145/87    Physical Exam: Constitutional: Vital signs reviewed.  Patient is well-developed and well-nourished in NAD and  cooperative with exam.  Head: Normocephalic and atraumatic. Eyes: PERRL, EOMI, conjunctivae nl, no scleral icterus.  Neck: Supple. Cardiovascular: RRR, no MRG. Pulmonary/Chest: normal effort, non-tender to palpation, CTAB, no wheezes, rales, or rhonchi. Abdominal: Soft. NT/ND +BS. Musculoskeletal: Full range ofmotion. no pain,edema,or  deformity.  Nocyanosis,clubbing,oredema. Neurological: A&O x3, cranial nerves II-XII are grossly intact, moving all extremities. Extremities: 2+DP b/l; no pitting edema. Skin: Warm, dry and intact. No rash.   Assessment & Plan:   Assessment and plan was discussed and formulated with my attending.

## 2014-08-14 NOTE — Assessment & Plan Note (Signed)
Pt comes in clinic today complains of worsening right heel pain and right great toe swelling.  She reports the pain has been there for a "long time" but has recently gotten worse.  She describes the pain as stabbing, that comes and goes, and is worse at the end of the day and worse with weight bearing.  Denies any trauma to the area or new shoes.  She does have a h/o OA of the knees and I suspect this is arthritis.  Also has a h/o peripheral neuropathy due to uncontrolled DMII.  Does not seem like plantar fasciitis as the pain is not worse first thing in the morning and it worsens with activity which would not be c/w plantar fasciitis.  On exam, I do not appreciate any swelling of the great toe or any erythema, warmth, or tenderness.  No h/o gout.  The right heel is tender to palpation but there is no other significant findings.  Denies any fever/chills, N/V or other systemic s/s.  Looking through her chart she had a previous XR of the foot which revealed DJD.   -XR of R foot to rule out any acute fracture -continue gabapentin -already followed by Dr. Dion SaucierLandau so will send her there for further evaluation if needed

## 2014-08-14 NOTE — Assessment & Plan Note (Addendum)
BP Readings from Last 3 Encounters:  08/14/14 181/72  08/06/14 154/82  08/02/14 112/78    Lab Results  Component Value Date   NA 137 04/10/2014   K 5.2 04/10/2014   CREATININE 1.61* 04/10/2014    Assessment: Blood pressure control: moderately elevated Progress toward BP goal:  deteriorated  Plan: Medications:  continue current medications, may need further adjustments at OV in December with PCP

## 2014-08-14 NOTE — Procedures (Addendum)
[facility] [address] [place] [address] [phone]  Cardiology Nuclear Med Study  [name] is a 63 y.o. female     MRN : [id number]     DOB: 11/02/1950  Procedure Date: 08/14/2014  Nuclear Med Background Indication for Stress Test:  Surgical Clearance History:  COPD and CAD;Diastolic CHF;Last NUC MPI on 02/03/2010-nonischemic;EF=60% Cardiac Risk Factors: Family History - CAD, History of Smoking, Hypertension, IDDM Type 2, Lipids and Overweight  Symptoms:  Chest Pain and DOE   Nuclear Pre-Procedure Caffeine/Decaff Intake:  1:00am NPO After: 11am   IV Site: R Forearm  IV 0.9% NS with Angio Cath:  22g  Chest Size (in):  n/a IV Started by: Berdie OgrenAmanda Wease, RN  Height: 5\' 5"  (1.651 m)  Cup Size: D  BMI:  Body mass index is 32.28 kg/(m^2). Weight:  194 lb (87.998 kg)   Tech Comments:  n/a    Nuclear Med Study 1 or 2 day study: 1 day  Stress Test Type:  Lexiscan  Order Authorizing Provider:  Peter SwazilandJordan, MD   Resting Radionuclide: Technetium 3518m Sestamibi  Resting Radionuclide Dose: 10.8 mCi   Stress Radionuclide:  Technetium 8518m Sestamibi  Stress Radionuclide Dose: 30.3 mCi           Stress Protocol Rest HR: 73 Stress HR: 86  Rest BP: 129/73 Stress BP:148/76  Exercise Time (min): n/a METS: n/a          Dose of Adenosine (mg):  n/a Dose of Lexiscan: 0.4 mg  Dose of Atropine (mg): n/a Dose of Dobutamine: n/a mcg/kg/min (at max HR)  Stress Test Technologist: Ernestene MentionGwen Farrington, CCT Nuclear Technologist: Gonzella LexPam Phillips, CNMT   Rest Procedure:  Myocardial perfusion imaging was performed at rest 45 minutes following the intravenous administration of Technetium 2118m Sestamibi. Stress Procedure:  The patient received IV Lexiscan 0.4 mg over 15-seconds.  Technetium 5018m Sestamibi injected IV at 30-seconds.  Patient experienced eye pain, pressure, lower back discomfort and was administered 75 mg of Aminophylline IV at 5  minutes. There were no significant changes with Lexiscan.  Quantitative spect images were obtained after a 45 minute delay.  Transient Ischemic Dilatation (Normal <1.22):  1.25  QGS EDV:  71 ml QGS ESV:  29 ml LV Ejection Fraction: 59%      Rest ECG: NSR - Normal EKG  Stress ECG: No significant change from baseline ECG  QPS Raw Data Images:  Normal; no motion artifact; normal heart/lung ratio. Stress Images:  There is a small area of mildly reduced tracer uptake in the mid-apical inferior wall Rest Images:  Normal homogeneous uptake in all areas of the myocardium. Subtraction (SDS):  These findings are consistent with mild ischemia in the distal RCA distribution. LV Wall Motion:  NL LV Function; NL Wall Motion  Impression Exercise Capacity:  Lexiscan with no exercise. BP Response:  Normal blood pressure response. Clinical Symptoms:  No significant symptoms noted. ECG Impression:  No significant ST segment change suggestive of ischemia. Comparison with Prior Nuclear Study: No images to compare   Overall Impression:  Low risk stress nuclear study with a small area of mild ischemia in the inferior wall.[name].   [name] Nanna, MD  08/14/2014 4:07 PM

## 2014-08-20 ENCOUNTER — Telehealth: Payer: Self-pay

## 2014-08-20 NOTE — Telephone Encounter (Signed)
[facility]Dr.Jordan cleared patient for upcoming surgery.Form signed and faxed back to fax # [phone].

## 2014-08-21 ENCOUNTER — Other Ambulatory Visit: Payer: Self-pay | Admitting: Orthopedic Surgery

## 2014-08-22 ENCOUNTER — Ambulatory Visit: Payer: Self-pay | Admitting: Cardiology

## 2014-08-26 ENCOUNTER — Other Ambulatory Visit: Payer: Self-pay | Admitting: *Deleted

## 2014-08-26 MED ORDER — INSULIN PEN NEEDLE 32G X 4 MM MISC: Status: DC

## 2014-08-27 ENCOUNTER — Telehealth: Payer: Self-pay | Admitting: Dietician

## 2014-08-27 ENCOUNTER — Ambulatory Visit (HOSPITAL_COMMUNITY): Payer: Self-pay | Admitting: Licensed Clinical Social Worker

## 2014-08-27 DIAGNOSIS — IMO0002 Reserved for concepts with insufficient information to code with codable children: Secondary | ICD-10-CM

## 2014-08-27 DIAGNOSIS — Z794 Long term (current) use of insulin: Principal | ICD-10-CM

## 2014-08-27 DIAGNOSIS — E1165 Type 2 diabetes mellitus with hyperglycemia: Secondary | ICD-10-CM

## 2014-08-27 NOTE — Telephone Encounter (Addendum)
Patient says that she needs to get blood sugar under better control before her surgery on 09-13-14. Wants to schedule an appointment with CDE.  Blood sugars high- because she eats in the middle of the night and doesn't  take insulin for it. Also does this during day as well. Wants to know if it is okay for her to take Novolog nocturnally when she eats Needs more needles for Novolog pen and strips for additional testing and injections, also needs directions on rapid acting to reflect her current doses.  Insurance is changing to Humalog in 2016. Is wondering if the incretins will help her and hopes to discuss this further with Dr. Danella Pentonruong in December. Appointment scheduled for 09-04-14 with CDE

## 2014-08-28 ENCOUNTER — Encounter (HOSPITAL_COMMUNITY): Payer: Self-pay | Admitting: Emergency Medicine

## 2014-08-28 ENCOUNTER — Telehealth: Payer: Self-pay | Admitting: *Deleted

## 2014-08-28 ENCOUNTER — Emergency Department (HOSPITAL_COMMUNITY)
Admission: EM | Admit: 2014-08-28 | Discharge: 2014-08-28 | Disposition: A | Discharge status: Home / Self Care | Payer: PRIVATE HEALTH INSURANCE | Attending: Emergency Medicine | Admitting: Emergency Medicine

## 2014-08-28 ENCOUNTER — Emergency Department (HOSPITAL_COMMUNITY): Payer: PRIVATE HEALTH INSURANCE

## 2014-08-28 DIAGNOSIS — I10 Essential (primary) hypertension: Secondary | ICD-10-CM | POA: Diagnosis not present

## 2014-08-28 DIAGNOSIS — Z8669 Personal history of other diseases of the nervous system and sense organs: Secondary | ICD-10-CM | POA: Diagnosis not present

## 2014-08-28 DIAGNOSIS — I5030 Unspecified diastolic (congestive) heart failure: Secondary | ICD-10-CM | POA: Diagnosis not present

## 2014-08-28 DIAGNOSIS — R52 Pain, unspecified: Secondary | ICD-10-CM

## 2014-08-28 DIAGNOSIS — I2510 Atherosclerotic heart disease of native coronary artery without angina pectoris: Secondary | ICD-10-CM | POA: Diagnosis not present

## 2014-08-28 DIAGNOSIS — M1990 Unspecified osteoarthritis, unspecified site: Secondary | ICD-10-CM | POA: Diagnosis not present

## 2014-08-28 DIAGNOSIS — Z862 Personal history of diseases of the blood and blood-forming organs and certain disorders involving the immune mechanism: Secondary | ICD-10-CM | POA: Insufficient documentation

## 2014-08-28 DIAGNOSIS — Z87891 Personal history of nicotine dependence: Secondary | ICD-10-CM | POA: Insufficient documentation

## 2014-08-28 DIAGNOSIS — Z880 Allergy status to penicillin: Secondary | ICD-10-CM | POA: Insufficient documentation

## 2014-08-28 DIAGNOSIS — M25572 Pain in left ankle and joints of left foot: Secondary | ICD-10-CM | POA: Insufficient documentation

## 2014-08-28 DIAGNOSIS — K219 Gastro-esophageal reflux disease without esophagitis: Secondary | ICD-10-CM | POA: Insufficient documentation

## 2014-08-28 DIAGNOSIS — Z794 Long term (current) use of insulin: Secondary | ICD-10-CM | POA: Diagnosis not present

## 2014-08-28 DIAGNOSIS — Z7982 Long term (current) use of aspirin: Secondary | ICD-10-CM | POA: Insufficient documentation

## 2014-08-28 DIAGNOSIS — F329 Major depressive disorder, single episode, unspecified: Secondary | ICD-10-CM | POA: Insufficient documentation

## 2014-08-28 DIAGNOSIS — E119 Type 2 diabetes mellitus without complications: Secondary | ICD-10-CM | POA: Insufficient documentation

## 2014-08-28 DIAGNOSIS — E785 Hyperlipidemia, unspecified: Secondary | ICD-10-CM | POA: Insufficient documentation

## 2014-08-28 DIAGNOSIS — Z79899 Other long term (current) drug therapy: Secondary | ICD-10-CM | POA: Insufficient documentation

## 2014-08-28 MED ORDER — OXYCODONE-ACETAMINOPHEN 5-325 MG PO TABS
1.0000 | ORAL_TABLET | ORAL | Status: DC | PRN
  Administered 2014-08-28: 1 via ORAL
  Filled 2014-08-28: qty 1

## 2014-08-28 MED ORDER — OXYCODONE-ACETAMINOPHEN 5-325 MG PO TABS: 1.0000 | ORAL_TABLET | ORAL | Status: DC | PRN

## 2014-08-28 NOTE — Discharge Instructions (Signed)
Please follow up with your primary care doctor if your pain does not improve.   If you develop fever, chills, increased swelling/redness then please return to the emergency room.

## 2014-08-28 NOTE — Telephone Encounter (Signed)
[name] called because she has a Community education officercontract with us and is taking tylenol #4.  She is having a problem with her foot--it is painful and swollen and she went [facility] and they have given her an rx for #20 percocet.  She wants to kno if it is ok for her to fill this and not take the tyl#4 until she has finished the #20 percocet.  Please advise.

## 2014-08-28 NOTE — Telephone Encounter (Signed)
OK for percocet, would like to see pt in office unless she has follow up with Ortho

## 2014-08-28 NOTE — ED Notes (Signed)
Pt back from x-ray.

## 2014-08-28 NOTE — ED Notes (Signed)
Pt experienced no relief from pain medicine. MD notified.

## 2014-08-28 NOTE — Telephone Encounter (Signed)
No fracture

## 2014-08-28 NOTE — Telephone Encounter (Signed)
I spoke with [name] and she is going to follow up with the ortho.  She will take percocet and then resume the tylenol #4 after she completes the rx.

## 2014-08-28 NOTE — Telephone Encounter (Signed)
Is it ok for her to fill the rx?

## 2014-08-28 NOTE — ED Provider Notes (Signed)
CSN: [id number]     Arrival date & time 08/28/14  0720 History   First MD Initiated Contact with Patient 08/28/14 [phone]     Chief Complaint  Patient presents with  . Foot Pain   HPI Ms. Riga is a 63yo woman w/ PMHx of HTN, Type 2 DM with peripheral neuropathy, and arthritis who presents to the ED with left foot pain. Patient states her foot pain started yesterday. She reports the pain awoke her last night during sleep. She states this morning she got up and when she placed weight on her foot she felt a sharp, shooting pain at the top of her left foot. She describes the pain as 9/10 in severity, sharp and shooting in quality, and worse when pressure is applied or when she moves her toes. She reports she took Tylenol this morning with no relief of the pain. She also reports some tingling/numbness in her toes, which is not new and she attributes to her diabetes. She denies any trauma to the foot. She states she has had pain in her right foot previously and had an x-ray last month which showed arthritis.   Past Medical History  Diagnosis Date  . Hypertension   . Peripheral neuropathy   . Cervicalgia   . Low back pain syndrome   . Hyperlipidemia   . Vitamin D deficiency   . Vitamin B12 deficiency   . Iliotibial band syndrome   . Anemia   . Borderline personality disorder   . Nonorganic psychosis   . GERD (gastroesophageal reflux disease)   . Diabetes mellitus   . Depression   . Diabetic gastroparesis   . CAD (coronary artery disease)     Catherization 11/18/09>nonobstructive CAD , normal LV function, EF 65%  . Health maintenance examination     Colonoscopy 2009> normal; Diabetic eye exam 12/2008. No diabetic retinopathy; Mammogram 11/10> No evidence of malignancy, DXA 03/14/13 : normal  . Headache(784.0)   . Arthritis   . SBO (small bowel obstruction) 01/09/2013  . Diastolic CHF     Grade I, on Echo 06/2013, EF 55-60%  . Sleep apnea     does not wear CPAP   . Diabetes mellitus, type II     Past Surgical History  Procedure Laterality Date  . Abdominal hysterectomy    . Hand surgery    . Bowel resection N/A 01/10/2013    Procedure: SMALL BOWEL RESECTION;  Surgeon: Lodema Pilot, DO;  Location: [facility] OR;  Service: General;  Laterality: N/A;  . Lysis of adhesion N/A 01/10/2013    Procedure: LYSIS OF ADHESION;  Surgeon: Lodema Pilot, DO;  Location: [facility] OR;  Service: General;  Laterality: N/A;  . Laparotomy N/A 01/10/2013    Procedure: EXPLORATORY LAPAROTOMY;  Surgeon: Lodema Pilot, DO;  Location: [facility] OR;  Service: General;  Laterality: N/A;  . Incisional hernia repair N/A 03/01/2014    Procedure: LAPAROSCOPIC INCISIONAL HERNIA;  Surgeon: Axel Filler, MD;  Location: [facility] ORS;  Service: General;  Laterality: N/A;  . Insertion of mesh N/A 03/01/2014    Procedure: INSERTION OF MESH;  Surgeon: Axel Filler, MD;  Location: [facility] ORS;  Service: General;  Laterality: N/A;  . Hernia repair  03/01/14    Lap incisional hernia repair w/mesh   Family History  Problem Relation Age of Onset  . Diabetes Mother   . Hypertension Mother   . Hyperlipidemia Mother   . Arthritis Mother   . Depression Mother   . Heart disease Father   .  Diabetes Sister   . Diabetes Brother   . Heart disease Brother   . Heart disease Paternal Grandmother   . Seizures Brother    History  Substance Use Topics  . Smoking status: Former Smoker    Types: Cigarettes    Quit date: 10/25/2002  . Smokeless tobacco: Never Used  . Alcohol Use: No   OB History    No data available     Review of Systems General: Denies fever, chills, night sweats, changes in weight, changes in appetite HEENT: Denies headaches, ear pain, changes in vision, rhinorrhea, sore throat CV: Denies CP, palpitations, SOB, orthopnea Pulm: Denies SOB, cough, wheezing GI: Denies abdominal pain, nausea, vomiting, diarrhea, constipation, melena, hematochezia GU: Denies dysuria, hematuria, frequency Msk: See HPI Neuro: See HPI Skin: Denies rashes,  bruising   Allergies  Cimetidine; Indomethacin; Penicillins; Indomethacin; and Sulfonamide derivatives  Home Medications   Prior to Admission medications   Medication Sig Start Date End Date Taking? Authorizing Provider  ACCU-CHEK FASTCLIX LANCETS MISC Use to Check Blood Sugars up to 4 Times a Day. ICD-10 Code: E11.65 08/07/14   Gara Kroneriana Truong, MD  acetaminophen-codeine (TYLENOL #4) 300-60 MG per tablet Take 1 tablet by mouth 3 (three) times daily. 07/09/14   Erick ColaceAndrew E Kirsteins, MD  aspirin 81 MG chewable tablet Chew 81 mg by mouth daily.    Historical Provider, MD  buPROPion (WELLBUTRIN XL) 150 MG 24 hr tablet Take 3 tablets (450 mg total) by mouth every morning. 07/05/14   Archer AsaGerald Plovsky, MD  busPIRone (BUSPAR) 10 MG tablet 1 qam 1  q dinner  With food for 1 week then 1 qam 2 qpm with dinner therafter 07/05/14   Archer AsaGerald Plovsky, MD  cloNIDine (CATAPRES - DOSED IN MG/24 HR) 0.2 mg/24hr patch PLACE 1 PATCH ONTO THE SKIN EVERY 7 DAYS 08/07/14   Gara Kroneriana Truong, MD  dexlansoprazole (DEXILANT) 60 MG capsule Take 60 mg by mouth daily.     Historical Provider, MD  Docusate Sodium (COLACE PO) Take 2 tablets by mouth daily as needed (constipation).    Historical Provider, MD  furosemide (LASIX) 40 MG tablet Take 1 tablet (40 mg total) by mouth every morning. 06/04/14   Gara Kroneriana Truong, MD  gabapentin (NEURONTIN) 800 MG tablet Take 1 tablet (800 mg total) by mouth as directed. 05/03/14   Inez CatalinaEmily B Mullen, MD  glucose blood (ACCU-CHEK AVIVA PLUS) test strip Check blood sugar up to 4 times a day as instructed 06/05/14   Gara Kroneriana Truong, MD  Insulin Glargine (LANTUS SOLOSTAR) 100 UNIT/ML Solostar Pen Inject 35 Units into the skin daily at 10 pm. 08/14/14   Marrian SalvageJacquelyn S Gill, MD  Insulin Pen Needle (BD PEN NEEDLE NANO U/F) 32G X 4 MM MISC USE TO TEST UP TO 5 TIMES A DAY ACCORDING TO CBG 08/26/14   Gara Kroneriana Truong, MD  isosorbide mononitrate (IMDUR) 60 MG 24 hr tablet Take 1 tablet (60 mg total) by mouth every morning. 06/18/14    Gara Kroneriana Truong, MD  losartan (COZAAR) 100 MG tablet Take 50 mg by mouth every morning.    Historical Provider, MD  metoprolol succinate (TOPROL-XL) 25 MG 24 hr tablet Take 1 tablet (25 mg total) by mouth every morning. 06/18/14   Gara Kroneriana Truong, MD  ondansetron [facility](ZOFRAN) 4 MG tablet  04/11/14   Historical Provider, MD  rosuvastatin (CRESTOR) 10 MG tablet Take 1 tablet (10 mg total) by mouth at bedtime. 03/08/14 03/08/15  Belia HemanNeema K Sharda, MD  senna (SENOKOT) 8.6 MG TABS Take 4  tablets by mouth daily as needed (constipation).    Historical Provider, MD   BP 157/74 mmHg  Temp(Src) 97.8 F (36.6 C) (Oral)  Resp 18  Ht 5\' 5"  (1.651 m)  Wt 198 lb (89.812 kg)  BMI 32.95 kg/m2  SpO2 98% Physical Exam General: alert, sitting up in bed HEENT: [place]/AT, EOMI, sclera anicteric, mucus membranes moist CV: RRR, normal S1/S2, no m/g/r Pulm: CTA bilaterally, breaths non-labored Abd: BS+, soft, obese, non-tender Ext: Left foot has area of mild erythema on dorsum of foot. Left foot is swollen compared to right and tender to palpation of dorsum. Tenderness also elicited with movement of toes and with dorsi- and plantarflexion of the left foot. No wounds present. Neuro: alert and oriented x 3, CNs II-XII intact, strength 5/5 in upper extremities and right lower extremity and 4+/5 in left lower extremity   ED Course  Procedures (including critical care time) Labs Review Labs Reviewed - No data to display  Imaging Review No results found.   EKG Interpretation None      MDM   Final diagnoses:  Pain   Ms. [name] is a 63yo woman w/ PMHx of HTN, Type 2 DM with peripheral neuropathy, and arthritis who presented with left foot pain. Differential includes pain 2/2 diabetic peripheral neuropathy and/or arthritis vs. Gout. Left foot does not appear to be infected. X-ray of left foot showed no evidence of fracture. Patient given Percocet 5-325 mg Q4H PRN pain and recommended to follow up with PCP if pain does not  improve.     Rich Numberarly Rivet, MD 08/28/14 [id number]  Purvis SheffieldForrest Harrison, MD 08/28/14 [phone]

## 2014-08-28 NOTE — ED Notes (Signed)
Pt states her left foot started hurting yesterday. The top part of her foot has a localized area of redness. Pt denies injuring her foot. Pt has hx of arthritis. Pt states it is painful to walk and pain shoots down her foot. Painful to palpation.

## 2014-08-30 ENCOUNTER — Ambulatory Visit: Payer: Self-pay | Admitting: Internal Medicine

## 2014-09-03 ENCOUNTER — Other Ambulatory Visit: Payer: Self-pay | Admitting: Internal Medicine

## 2014-09-03 DIAGNOSIS — E1165 Type 2 diabetes mellitus with hyperglycemia: Secondary | ICD-10-CM

## 2014-09-03 DIAGNOSIS — Z794 Long term (current) use of insulin: Principal | ICD-10-CM

## 2014-09-03 DIAGNOSIS — IMO0002 Reserved for concepts with insufficient information to code with codable children: Secondary | ICD-10-CM

## 2014-09-03 MED ORDER — INSULIN ASPART 100 UNIT/ML FLEXPEN: PEN_INJECTOR | SUBCUTANEOUS | Status: DC

## 2014-09-03 MED ORDER — GLUCOSE BLOOD VI STRP: ORAL_STRIP | Status: DC

## 2014-09-03 MED ORDER — INSULIN PEN NEEDLE 32G X 4 MM MISC: Status: DC

## 2014-09-03 NOTE — Telephone Encounter (Signed)
Hi Lupita LeashDonna,  Just signed the rx for the pens. Thanks. Dr. Danella Pentonruong

## 2014-09-03 NOTE — Telephone Encounter (Signed)
Hello [name], I signLupita Leashed 2 of the pending orders but could not find the pending order for the novolog flex pen in her current meds. Will forward this to Dr. Danella Pentonruong as well

## 2014-09-04 ENCOUNTER — Ambulatory Visit (INDEPENDENT_AMBULATORY_CARE_PROVIDER_SITE_OTHER): Payer: PRIVATE HEALTH INSURANCE | Admitting: Dietician

## 2014-09-04 ENCOUNTER — Other Ambulatory Visit: Payer: Self-pay | Admitting: Internal Medicine

## 2014-09-04 VITALS — Wt 192.7 lb

## 2014-09-04 DIAGNOSIS — Z794 Long term (current) use of insulin: Principal | ICD-10-CM

## 2014-09-04 DIAGNOSIS — IMO0002 Reserved for concepts with insufficient information to code with codable children: Secondary | ICD-10-CM

## 2014-09-04 DIAGNOSIS — E1165 Type 2 diabetes mellitus with hyperglycemia: Secondary | ICD-10-CM

## 2014-09-04 NOTE — Patient Instructions (Signed)
Per Dr. Danella Pentonruong you can:  increase your lantus to 47 units each day, IF you have any blood sugars less  Than 80 please drop this back to 43 units immediately.   For your information- your meter settings are now:  Your Insulin to carb ratio is now 1 unit for every 5 grams of carb Your Correction dose is 1 unit of each 20 mg/dl above 409140 before meals and 220 after meals.

## 2014-09-04 NOTE — Progress Notes (Addendum)
Medical Nutrition Therapy:  Appt start time: 1335 end time:  1445  Assessment:  Primary concerns today: Blood sugar control.  Patient's concerns are having her blood sugars under better control. She has been having a difficult time emotionally and has been eating at night, sweets, ice cream. Doing well with meter and carb counting except she was not using meter or insulin for nocturnal eating. Showed her how to get bolus advice when not eating as well today. Denies  signs or symptoms of hypoglycemia. Reports keeping treatment available to her at all times- in car, in purse and at home. She has an upcoming appointment with GI for nausea, without vomiting or constipation that has been increasing this past week. She also reports that she has been getting steroid injections last dose yesterday.  Meter download for past 30 days: average of  80 readings is 272, range 88-575, standard deviation ~100    INSULIN:  Current basal is 35 units, current bolus average is  ~ 51 units for a TDD of 86 units, after discussion with doctor, increase by 10% = new TDD of 95 units/day, new CIR: 1 unit to 5 grams carbs, new Correction factor: 1 unit lowers CBG ~ 20 mg/dl  Lantus increase to 47 units/day .   Progress Towards Goal(s):  No progress.   Nutritional Diagnosis:  [place]-2.3 Food-medication interaction As related to lack of coordination between food intake and insulin dosages is less improved due to multiple factors As evidenced by her deterioration in  blood sugars,( but seems  less frustrated with being restricted on what and when she eats to have to control her blood sugars).    Intervention:  Nutrition support and encouragement about using Aviva expert meter and carb counting. Coordination of care: Discussed blood sugars with physician today . Patient to use exercise adjustment for "busy" days. Take a break from recording food intake except when having lows or highs. Teaching Method Utilized: Visual, Auditory, Hands  on Handouts given during visit include: AVS Barriers to learning/adherence to lifestyle change: Mood/depression/diabetes distress  Demonstrated degree of understanding via:  Teach Back   Monitoring/Evaluation:  Dietary intake, exercise, blood sugars, and body weight in 6 week(s).

## 2014-09-05 ENCOUNTER — Ambulatory Visit (INDEPENDENT_AMBULATORY_CARE_PROVIDER_SITE_OTHER): Payer: 59 | Admitting: Psychiatry

## 2014-09-05 ENCOUNTER — Telehealth: Payer: Self-pay | Admitting: Physical Medicine & Rehabilitation

## 2014-09-05 ENCOUNTER — Ambulatory Visit (HOSPITAL_COMMUNITY): Payer: Self-pay | Admitting: Psychiatry

## 2014-09-05 VITALS — BP 131/86 | HR 70 | Ht 65.0 in | Wt 196.6 lb

## 2014-09-05 DIAGNOSIS — F332 Major depressive disorder, recurrent severe without psychotic features: Secondary | ICD-10-CM

## 2014-09-05 DIAGNOSIS — F331 Major depressive disorder, recurrent, moderate: Secondary | ICD-10-CM

## 2014-09-05 MED ORDER — BUPROPION HCL ER (XL) 150 MG PO TB24: 450.0000 mg | ORAL_TABLET | Freq: Every morning | ORAL | Status: DC

## 2014-09-05 MED ORDER — BUSPIRONE HCL 15 MG PO TABS: ORAL_TABLET | ORAL | Status: DC

## 2014-09-05 NOTE — Progress Notes (Signed)
[facility]  09/05/2014 4:29 PM [name]  MRN:  [id number] Subjective: nauseated The patient just came from her gastroenterologist seeing him for unexplainable nausea. The nausea is unrelated to the medication she's receiving here. She said it started way before her medicines. When her last visit we started BuSpar and the patient says her anxiety is improved a small but measurable degree. She denies daily depression. She sleeping and eating well now her concentration is good. The patient still experiences some moderate anhedonia. She has a good sense of worth and her psychomotor functioning is normal. She's not suicidal. 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 The patient is in therapy here. She's having some ongoing issues related to her husband. He apparently is drinking. The patient also has some physical problems going on besides her GI tract she's been having hand surgery in the next week. The patient isn't particularly looking forward to the holidays. Overall though her mood is somewhat improved. Diagnosis:   DSM5: Schizophrenia Disorders:   Obsessive-Compulsive Disorders:   Trauma-Stressor Disorders:   Substance/Addictive Disorders:   Depressive Disorders:  Major Depressive Disorder - Mild (296.21) Total Time spent with patient: 30 minutes  Axis I: Major Depression, Recurrent severe  ADL's:  Intact  Sleep: Good  Appetite:  Good  Suicidal Ideation:  no Homicidal Ideation:  none AEB (as evidenced by):  Psychiatric Specialty Exam: Physical Exam  ROS  There were no vitals taken for this visit.There is no weight on file to calculate BMI.  General Appearance: Casual  Eye Contact::  Good  Speech:  Clear and Coherent  Volume:  Normal  Mood:  Euthymic  Affect:  Congruent  Thought Process:  Goal Directed  Orientation:  NA  Thought Content:  WDL  Suicidal Thoughts:  No  Homicidal Thoughts:  No  Memory:  NA  Judgement:  Good  Insight:  Good   Psychomotor Activity:  Normal  Concentration:  Good  Recall:  Good  Fund of Knowledge:Good  Language: Good  Akathisia:  No  Handed:  Right  AIMS (if indicated):     Assets:  Communication Skills  Sleep:      Musculoskeletal: Strength & Muscle Tone:  Gait & Station:  Patient leans:   Current Medications: Current Outpatient Prescriptions  Medication Sig Dispense Refill  . ACCU-CHEK FASTCLIX LANCETS MISC Use to Check Blood Sugars up to 4 Times a Day. ICD-10 Code: E11.65 102 each 6  . acetaminophen-codeine (TYLENOL #4) 300-60 MG per tablet Take 1 tablet by mouth 3 (three) times daily. 90 tablet 1  . aspirin 81 MG chewable tablet Chew 81 mg by mouth daily.    Marland Kitchen. buPROPion (WELLBUTRIN XL) 150 MG 24 hr tablet Take 3 tablets (450 mg total) by mouth every morning. 90 tablet 5  . busPIRone (BUSPAR) 10 MG tablet Take 10-20 mg by mouth 2 (two) times daily. Take 10 mg every morning and 20 mg every evening    . busPIRone (BUSPAR) 15 MG tablet 1 qam 1  q dinner  With food for 1 week then 1 qam 2 qpm with dinner therafter 90 tablet 10  . cloNIDine (CATAPRES - DOSED IN MG/24 HR) 0.2 mg/24hr patch PLACE 1 PATCH ONTO THE SKIN EVERY 7 DAYS (Patient not taking: Reported on 08/28/2014) 4 patch 2  . cloNIDine (CATAPRES - DOSED IN MG/24 HR) 0.2 mg/24hr patch Place 0.2 mg onto the skin every Tuesday.    Marland Kitchen. dexlansoprazole (DEXILANT) 60 MG capsule Take 60  mg by mouth daily.     Tery Sanfilippo. Docusate Sodium (COLACE PO) Take 2 tablets by mouth daily as needed (for constipation).     . furosemide (LASIX) 40 MG tablet Take 1 tablet (40 mg total) by mouth every morning. 30 tablet 3  . gabapentin (NEURONTIN) 800 MG tablet Take 1 tablet (800 mg total) by mouth as directed. 90 tablet 11  . glucose blood (ACCU-CHEK AVIVA PLUS) test strip Check blood sugar up to 4 times a day as instructed 125 each 5  . insulin aspart (NOVOLOG FLEXPEN) 100 UNIT/ML FlexPen inject with meals and snacks up to 8 times a day as directed by your aviva  glucometer. 30 mL 11  . insulin aspart (NOVOLOG) 100 UNIT/ML injection Inject into the skin 3 (three) times daily before meals. Sliding scale according to carb intake    . Insulin Glargine (LANTUS SOLOSTAR) 100 UNIT/ML Solostar Pen Inject 35 Units into the skin daily at 10 pm. 15 mL 11  . Insulin Pen Needle (BD PEN NEEDLE NANO U/F) 32G X 4 MM MISC USE TO TEST UP TO 5 TIMES A DAY ACCORDING TO CBG 100 each 4  . isosorbide mononitrate (IMDUR) 60 MG 24 hr tablet Take 1 tablet (60 mg total) by mouth every morning. 30 tablet 6  . losartan (COZAAR) 100 MG tablet Take 50 mg by mouth every morning.    . metoprolol succinate (TOPROL-XL) 25 MG 24 hr tablet Take 1 tablet (25 mg total) by mouth every morning. 30 tablet 6  . ondansetron (ZOFRAN) 4 MG tablet Take 4 mg by mouth every 8 (eight) hours as needed for nausea.     Marland Kitchen. oxyCODONE-acetaminophen (PERCOCET/ROXICET) 5-325 MG per tablet Take 1 tablet by mouth every 4 (four) hours as needed for severe pain. 20 tablet 0  . rosuvastatin (CRESTOR) 10 MG tablet Take 1 tablet (10 mg total) by mouth at bedtime. 30 tablet 11  . senna (SENOKOT) 8.6 MG TABS Take 4 tablets by mouth daily as needed (for constipation).      No current facility-administered medications for this visit.    Lab Results: No results found for this or any previous visit (from the past 48 hour(s)).  Physical Findings: AIMS:  , ,  ,  ,    CIWA:    COWS:     Treatment Plan Summary: At this time we'll go ahead and increase her BuSpar to one more level. She'll start taking 15 mg 1 in the morning and 2 at suppertime. She's instructed to take the BuSpar always with food. I do not think her BuSpar has anything to do with her nausea. The patient takes 4 and 50 mg of Wellbutrin and seems to do well. The patient will continue in psychotherapy in the setting and return to see me in approximately 2 months. Today went over there were pros and cons of her medications and she agreed to take them as is. The  patient denies any shortness of breath or chest pain or any other physical complaints other than nausea.  Plan:  Medical Decision Making Problem Points:  Established problem, stable/improving (1) Data Points:  Review of medication regiment & side effects (2)  I certify that inpatient services furnished can reasonably be expected to improve the patient's condition.   Lucas MallowLOVSKY, Palin Tristan IRVING 09/05/2014, 4:29 PM

## 2014-09-05 NOTE — Telephone Encounter (Signed)
Pt is stating her back hurting constantly and the medication is not working. Patient is unable to lay down all the time.. She has to still live her life and she is having back pain and nausea.Marland Kitchen..Marland Kitchen

## 2014-09-05 NOTE — Telephone Encounter (Signed)
Not sure how to proceed.  Looks like she is preop for CTS with Dr D. Eulah PontMurphy

## 2014-09-05 NOTE — Telephone Encounter (Signed)
Place her on schedule for L3 L4 L5 radiofrequency We'll try to work around carpal tunnel release

## 2014-09-06 ENCOUNTER — Ambulatory Visit (HOSPITAL_COMMUNITY): Payer: Self-pay | Admitting: Psychiatry

## 2014-09-10 ENCOUNTER — Other Ambulatory Visit: Payer: Self-pay | Admitting: Physical Medicine & Rehabilitation

## 2014-09-10 ENCOUNTER — Encounter: Payer: Self-pay | Admitting: Physical Medicine & Rehabilitation

## 2014-09-10 ENCOUNTER — Ambulatory Visit (HOSPITAL_BASED_OUTPATIENT_CLINIC_OR_DEPARTMENT_OTHER): Payer: PRIVATE HEALTH INSURANCE | Admitting: Physical Medicine & Rehabilitation

## 2014-09-10 ENCOUNTER — Encounter (HOSPITAL_COMMUNITY): Payer: Self-pay | Admitting: Licensed Clinical Social Worker

## 2014-09-10 ENCOUNTER — Encounter: Payer: PRIVATE HEALTH INSURANCE | Attending: Physical Medicine & Rehabilitation

## 2014-09-10 VITALS — HR 82 | Resp 14 | Ht 65.0 in | Wt 198.8 lb

## 2014-09-10 DIAGNOSIS — Z5181 Encounter for therapeutic drug level monitoring: Secondary | ICD-10-CM

## 2014-09-10 DIAGNOSIS — M47817 Spondylosis without myelopathy or radiculopathy, lumbosacral region: Secondary | ICD-10-CM

## 2014-09-10 DIAGNOSIS — G894 Chronic pain syndrome: Secondary | ICD-10-CM

## 2014-09-10 DIAGNOSIS — Z79899 Other long term (current) drug therapy: Secondary | ICD-10-CM | POA: Diagnosis not present

## 2014-09-10 MED ORDER — TIZANIDINE HCL 2 MG PO TABS: 2.0000 mg | ORAL_TABLET | Freq: Three times a day (TID) | ORAL | Status: DC | PRN

## 2014-09-10 NOTE — Addendum Note (Signed)
Addended by: Orson SlickGINKEL, Monika Chestang on: 09/10/2014 01:55 PM   Modules accepted: Orders

## 2014-09-10 NOTE — Patient Instructions (Signed)
Good lunch with Carpal tunnel surgery  Will try muscle relaxer for back

## 2014-09-10 NOTE — Progress Notes (Signed)
Subjective:    Patient ID: [name], female    DOB: [birth year], 63 y.o.   MRN: [id number]  HPI  Low back still hurting. Has tried Tylenol 3, Tylenol 4, tramadol, oxycodone, hydrocodone all without significant improvement. In the past has also tried cyclobenzaprine with a little bit of improvement as well as Medrol dose pack which also helped her back a little bit but mainly her other joints Unable to take nonsteroidal secondary to renal insufficiency Scheduled for lumbar radiofrequency procedure next month since she is undergoing right carpal tunnel release this month  Reviewed psychiatry notes, doing well from that standpoint Pain Inventory Average Pain 7 Pain Right Now 6 My pain is constant, burning, dull, stabbing and aching  In the last 24 hours, has pain interfered with the following? General activity 6 Relation with others 7 Enjoyment of life 7 What TIME of day is your pain at its worst? varies Sleep (in general) Fair  Pain is worse with: walking, bending, sitting, standing and some activites Pain improves with: rest, heat/ice and medication Relief from Meds: 4  Mobility walk without assistance how many minutes can you walk? 10 ability to climb steps?  yes do you drive?  yes  Function disabled: date disabled .  Neuro/Psych trouble walking depression anxiety  Prior Studies Any changes since last visit?  yes  Physicians involved in your care Any changes since last visit?  yes   Family History  Problem Relation Age of Onset  . Diabetes Mother   . Hypertension Mother   . Hyperlipidemia Mother   . Arthritis Mother   . Depression Mother   . Heart disease Father   . Diabetes Sister   . Diabetes Brother   . Heart disease Brother   . Heart disease Paternal Grandmother   . Seizures Brother    History   Social History  . Marital Status: Married    Spouse Name: N/A    Number of Children: N/A  . Years of Education: N/A   Social History Main Topics    . Smoking status: Former Smoker    Types: Cigarettes    Quit date: 10/25/2002  . Smokeless tobacco: Never Used  . Alcohol Use: No  . Drug Use: No  . Sexual Activity: Yes   Other Topics Concern  . None   Social History Narrative   Past Surgical History  Procedure Laterality Date  . Abdominal hysterectomy    . Hand surgery    . Bowel resection N/A 01/10/2013    Procedure: SMALL BOWEL RESECTION;  Surgeon: Lodema PilotBrian Layton, DO;  Location: [facility];  Service: General;  Laterality: N/A;  . Lysis of adhesion N/A 01/10/2013    Procedure: LYSIS OF ADHESION;  Surgeon: Lodema PilotBrian Layton, DO;  Location: [facility];  Service: General;  Laterality: N/A;  . Laparotomy N/A 01/10/2013    Procedure: EXPLORATORY LAPAROTOMY;  Surgeon: Lodema PilotBrian Layton, DO;  Location: [facility];  Service: General;  Laterality: N/A;  . Incisional hernia repair N/A 03/01/2014    Procedure: LAPAROSCOPIC INCISIONAL HERNIA;  Surgeon: Axel FillerArmando Ramirez, MD;  Location: [facility];  Service: General;  Laterality: N/A;  . Insertion of mesh N/A 03/01/2014    Procedure: INSERTION OF MESH;  Surgeon: Axel FillerArmando Ramirez, MD;  Location: [facility];  Service: General;  Laterality: N/A;  . Hernia repair  03/01/14    Lap incisional hernia repair w/mesh   Past Medical History  Diagnosis Date  . Hypertension   . Peripheral neuropathy   . Cervicalgia   .  Low back pain syndrome   . Hyperlipidemia   . Vitamin D deficiency   . Vitamin B12 deficiency   . Iliotibial band syndrome   . Anemia   . Borderline personality disorder   . Nonorganic psychosis   . GERD (gastroesophageal reflux disease)   . Diabetes mellitus   . Depression   . Diabetic gastroparesis   . CAD (coronary artery disease)     Catherization 11/18/09>nonobstructive CAD , normal LV function, EF 65%  . Health maintenance examination     Colonoscopy 2009> normal; Diabetic eye exam 12/2008. No diabetic retinopathy; Mammogram 11/10> No evidence of malignancy, DXA 03/14/13 : normal  . Headache(784.0)   . Arthritis    . SBO (small bowel obstruction) 01/09/2013  . Diastolic CHF     Grade I, on Echo 06/2013, EF 55-60%  . Sleep apnea     does not wear CPAP   . Diabetes mellitus, type II    Pulse 82  Resp 14  Ht 5\' 5"  (1.651 m)  Wt 198 lb 12.8 oz (90.175 kg)  BMI 33.08 kg/m2  SpO2 100%  Opioid Risk Score:   Fall Risk Score: Low Fall Risk (0-5 points)   Review of Systems  Constitutional: Negative.   HENT: Negative.   Eyes: Negative.   Cardiovascular: Negative.   Gastrointestinal: Negative.   Endocrine: Positive for cold intolerance.  Genitourinary: Negative.   Musculoskeletal: Positive for myalgias, back pain and arthralgias.  Skin: Negative.   Allergic/Immunologic: Negative.   Neurological: Negative.   Hematological: Negative.   Psychiatric/Behavioral: Negative.        Objective:   Physical Exam  Constitutional: She is oriented to person, place, and time. She appears well-developed.  Musculoskeletal:       Lumbar back: She exhibits decreased range of motion and tenderness. She exhibits no deformity.  Tenderness with very light palpation Lumbar paraspinals   Neurological: She is alert and oriented to person, place, and time. She has normal strength. Gait normal.  Reflex Scores:      Patellar reflexes are 0 on the right side and 0 on the left side.      Achilles reflexes are 0 on the right side and 0 on the left side. Decreased sensation R foot      Psychiatric: She has a normal mood and affect.  Nursing note and vitals reviewed.         Assessment & Plan:  1. Lumbar spondylosis. Had responded short-term to medial branch blocks L3 and L4 as well as L5 dorsal ramus injections. She is scheduled for radiofrequency procedure after carpal tunnel release. We discussed medication management until that time. She has not responded well to narcotic analgesics such as codeine, oxycodone, hydrocodone. Because of renal insufficiency will not try nonsteroidal anti-inflammatory Has had  some real limited results with cyclobenzaprine in the past, we'll try tizanidine. 2. Diabetic neuropathy with reduced reflexes and sensation, not painful

## 2014-09-11 ENCOUNTER — Encounter (HOSPITAL_BASED_OUTPATIENT_CLINIC_OR_DEPARTMENT_OTHER)
Admission: RE | Admit: 2014-09-11 | Discharge: 2014-09-11 | Disposition: A | Discharge status: Home / Self Care | Payer: PRIVATE HEALTH INSURANCE | Source: Ambulatory Visit | Attending: Orthopedic Surgery | Admitting: Orthopedic Surgery

## 2014-09-11 ENCOUNTER — Encounter (HOSPITAL_BASED_OUTPATIENT_CLINIC_OR_DEPARTMENT_OTHER): Payer: Self-pay | Admitting: *Deleted

## 2014-09-11 DIAGNOSIS — E538 Deficiency of other specified B group vitamins: Secondary | ICD-10-CM | POA: Diagnosis not present

## 2014-09-11 DIAGNOSIS — M109 Gout, unspecified: Secondary | ICD-10-CM | POA: Diagnosis not present

## 2014-09-11 DIAGNOSIS — E559 Vitamin D deficiency, unspecified: Secondary | ICD-10-CM | POA: Diagnosis not present

## 2014-09-11 DIAGNOSIS — K219 Gastro-esophageal reflux disease without esophagitis: Secondary | ICD-10-CM | POA: Diagnosis not present

## 2014-09-11 DIAGNOSIS — I5030 Unspecified diastolic (congestive) heart failure: Secondary | ICD-10-CM | POA: Diagnosis not present

## 2014-09-11 DIAGNOSIS — M25532 Pain in left wrist: Secondary | ICD-10-CM | POA: Diagnosis not present

## 2014-09-11 DIAGNOSIS — G4730 Sleep apnea, unspecified: Secondary | ICD-10-CM | POA: Diagnosis not present

## 2014-09-11 DIAGNOSIS — G5601 Carpal tunnel syndrome, right upper limb: Secondary | ICD-10-CM | POA: Diagnosis not present

## 2014-09-11 DIAGNOSIS — I10 Essential (primary) hypertension: Secondary | ICD-10-CM | POA: Diagnosis not present

## 2014-09-11 DIAGNOSIS — Z7982 Long term (current) use of aspirin: Secondary | ICD-10-CM | POA: Diagnosis not present

## 2014-09-11 DIAGNOSIS — E785 Hyperlipidemia, unspecified: Secondary | ICD-10-CM | POA: Diagnosis not present

## 2014-09-11 DIAGNOSIS — F329 Major depressive disorder, single episode, unspecified: Secondary | ICD-10-CM | POA: Diagnosis not present

## 2014-09-11 DIAGNOSIS — E1143 Type 2 diabetes mellitus with diabetic autonomic (poly)neuropathy: Secondary | ICD-10-CM | POA: Diagnosis not present

## 2014-09-11 DIAGNOSIS — Z87891 Personal history of nicotine dependence: Secondary | ICD-10-CM | POA: Diagnosis not present

## 2014-09-11 DIAGNOSIS — I2510 Atherosclerotic heart disease of native coronary artery without angina pectoris: Secondary | ICD-10-CM | POA: Diagnosis not present

## 2014-09-11 DIAGNOSIS — K3184 Gastroparesis: Secondary | ICD-10-CM | POA: Diagnosis not present

## 2014-09-11 DIAGNOSIS — M1990 Unspecified osteoarthritis, unspecified site: Secondary | ICD-10-CM | POA: Diagnosis not present

## 2014-09-11 LAB — BASIC METABOLIC PANEL
Anion gap: 15 (ref 5–15)
BUN: 23 mg/dL (ref 6–23)
CO2: 25 mEq/L (ref 19–32)
Calcium: 9.5 mg/dL (ref 8.4–10.5)
Chloride: 104 mEq/L (ref 96–112)
Creatinine, Ser: 1.41 mg/dL — ABNORMAL HIGH (ref 0.50–1.10)
GFR, EST AFRICAN AMERICAN: 45 mL/min — AB (ref >90)
GFR, EST NON AFRICAN AMERICAN: 39 mL/min — AB (ref >90)
Glucose, Bld: 125 mg/dL — ABNORMAL HIGH (ref 70–99)
POTASSIUM: 4.7 meq/L (ref 3.7–5.3)
SODIUM: 144 meq/L (ref 137–147)

## 2014-09-11 LAB — PMP ALCOHOL METABOLITE (ETG): Ethyl Glucuronide (EtG): NEGATIVE ng/mL

## 2014-09-11 NOTE — Progress Notes (Signed)
Pt came in for bmet-had ekg-8/15-had mild OSA-no cpap

## 2014-09-13 ENCOUNTER — Ambulatory Visit (HOSPITAL_BASED_OUTPATIENT_CLINIC_OR_DEPARTMENT_OTHER)
Admission: RE | Admit: 2014-09-13 | Discharge: 2014-09-13 | Disposition: A | Discharge status: Home / Self Care | Payer: PRIVATE HEALTH INSURANCE | Source: Ambulatory Visit | Attending: Orthopedic Surgery | Admitting: Orthopedic Surgery

## 2014-09-13 ENCOUNTER — Ambulatory Visit (HOSPITAL_BASED_OUTPATIENT_CLINIC_OR_DEPARTMENT_OTHER): Payer: PRIVATE HEALTH INSURANCE | Admitting: Certified Registered"

## 2014-09-13 ENCOUNTER — Encounter (HOSPITAL_BASED_OUTPATIENT_CLINIC_OR_DEPARTMENT_OTHER)
Admission: RE | Disposition: A | Discharge status: Home / Self Care | Payer: Self-pay | Source: Ambulatory Visit | Attending: Orthopedic Surgery

## 2014-09-13 ENCOUNTER — Encounter (HOSPITAL_BASED_OUTPATIENT_CLINIC_OR_DEPARTMENT_OTHER): Payer: Self-pay | Admitting: *Deleted

## 2014-09-13 DIAGNOSIS — G5601 Carpal tunnel syndrome, right upper limb: Secondary | ICD-10-CM | POA: Diagnosis not present

## 2014-09-13 DIAGNOSIS — E538 Deficiency of other specified B group vitamins: Secondary | ICD-10-CM | POA: Insufficient documentation

## 2014-09-13 DIAGNOSIS — E559 Vitamin D deficiency, unspecified: Secondary | ICD-10-CM | POA: Diagnosis not present

## 2014-09-13 DIAGNOSIS — E1143 Type 2 diabetes mellitus with diabetic autonomic (poly)neuropathy: Secondary | ICD-10-CM | POA: Insufficient documentation

## 2014-09-13 DIAGNOSIS — F329 Major depressive disorder, single episode, unspecified: Secondary | ICD-10-CM | POA: Insufficient documentation

## 2014-09-13 DIAGNOSIS — G4730 Sleep apnea, unspecified: Secondary | ICD-10-CM | POA: Insufficient documentation

## 2014-09-13 DIAGNOSIS — E785 Hyperlipidemia, unspecified: Secondary | ICD-10-CM | POA: Insufficient documentation

## 2014-09-13 DIAGNOSIS — Z87891 Personal history of nicotine dependence: Secondary | ICD-10-CM | POA: Insufficient documentation

## 2014-09-13 DIAGNOSIS — K219 Gastro-esophageal reflux disease without esophagitis: Secondary | ICD-10-CM | POA: Insufficient documentation

## 2014-09-13 DIAGNOSIS — M25532 Pain in left wrist: Secondary | ICD-10-CM | POA: Insufficient documentation

## 2014-09-13 DIAGNOSIS — I2510 Atherosclerotic heart disease of native coronary artery without angina pectoris: Secondary | ICD-10-CM | POA: Insufficient documentation

## 2014-09-13 DIAGNOSIS — Z7982 Long term (current) use of aspirin: Secondary | ICD-10-CM | POA: Insufficient documentation

## 2014-09-13 DIAGNOSIS — I5030 Unspecified diastolic (congestive) heart failure: Secondary | ICD-10-CM | POA: Insufficient documentation

## 2014-09-13 DIAGNOSIS — R202 Paresthesia of skin: Secondary | ICD-10-CM | POA: Diagnosis present

## 2014-09-13 DIAGNOSIS — K3184 Gastroparesis: Secondary | ICD-10-CM | POA: Insufficient documentation

## 2014-09-13 DIAGNOSIS — R200 Anesthesia of skin: Secondary | ICD-10-CM | POA: Diagnosis present

## 2014-09-13 DIAGNOSIS — M1990 Unspecified osteoarthritis, unspecified site: Secondary | ICD-10-CM | POA: Insufficient documentation

## 2014-09-13 DIAGNOSIS — I10 Essential (primary) hypertension: Secondary | ICD-10-CM | POA: Insufficient documentation

## 2014-09-13 DIAGNOSIS — M109 Gout, unspecified: Secondary | ICD-10-CM | POA: Insufficient documentation

## 2014-09-13 HISTORY — PX: STERIOD INJECTION: SHX5046

## 2014-09-13 HISTORY — DX: Presence of spectacles and contact lenses: Z97.3

## 2014-09-13 HISTORY — DX: Carpal tunnel syndrome, right upper limb: G56.01

## 2014-09-13 HISTORY — PX: CARPAL TUNNEL RELEASE: SHX101

## 2014-09-13 HISTORY — DX: Gout, unspecified: M10.9

## 2014-09-13 HISTORY — DX: Presence of dental prosthetic device (complete) (partial): Z97.2

## 2014-09-13 LAB — POCT HEMOGLOBIN-HEMACUE
HEMOGLOBIN: 8.7 g/dL — AB (ref 12.0–15.0)
Hemoglobin: 10 g/dL — ABNORMAL LOW (ref 12.0–15.0)

## 2014-09-13 LAB — GLUCOSE, CAPILLARY
GLUCOSE-CAPILLARY: 98 mg/dL (ref 70–99)
Glucose-Capillary: 122 mg/dL — ABNORMAL HIGH (ref 70–99)

## 2014-09-13 SURGERY — CARPAL TUNNEL RELEASE
Anesthesia: General | Site: Wrist | Laterality: Right

## 2014-09-13 MED ORDER — LIDOCAINE HCL 1 % IJ SOLN
INTRAMUSCULAR | Status: DC | PRN
Start: 2014-09-13 — End: 2014-09-13
  Administered 2014-09-13: 3 mL

## 2014-09-13 MED ORDER — BETAMETHASONE SOD PHOS & ACET 6 (3-3) MG/ML IJ SUSP
INTRAMUSCULAR | Status: AC
  Filled 2014-09-13: qty 1

## 2014-09-13 MED ORDER — FENTANYL CITRATE 0.05 MG/ML IJ SOLN
25.0000 ug | INTRAMUSCULAR | Status: DC | PRN
  Administered 2014-09-13: 50 ug via INTRAVENOUS
  Administered 2014-09-13: 25 ug via INTRAVENOUS

## 2014-09-13 MED ORDER — FENTANYL CITRATE 0.05 MG/ML IJ SOLN
INTRAMUSCULAR | Status: AC
  Filled 2014-09-13: qty 2

## 2014-09-13 MED ORDER — BUPIVACAINE HCL (PF) 0.5 % IJ SOLN
INTRAMUSCULAR | Status: AC
  Filled 2014-09-13: qty 30

## 2014-09-13 MED ORDER — OXYCODONE-ACETAMINOPHEN 5-325 MG PO TABS: 1.0000 | ORAL_TABLET | Freq: Four times a day (QID) | ORAL | Status: DC | PRN

## 2014-09-13 MED ORDER — SENNA-DOCUSATE SODIUM 8.6-50 MG PO TABS: 2.0000 | ORAL_TABLET | Freq: Every day | ORAL | Status: DC

## 2014-09-13 MED ORDER — FENTANYL CITRATE 0.05 MG/ML IJ SOLN
INTRAMUSCULAR | Status: DC | PRN
  Administered 2014-09-13: 100 ug via INTRAVENOUS

## 2014-09-13 MED ORDER — CEFAZOLIN SODIUM-DEXTROSE 2-3 GM-% IV SOLR
INTRAVENOUS | Status: AC
  Filled 2014-09-13: qty 50

## 2014-09-13 MED ORDER — ONDANSETRON HCL 4 MG/2ML IJ SOLN: 4.0000 mg | Freq: Once | INTRAMUSCULAR | Status: DC | PRN

## 2014-09-13 MED ORDER — LIDOCAINE HCL (CARDIAC) 20 MG/ML IV SOLN
INTRAVENOUS | Status: DC | PRN
  Administered 2014-09-13: 100 mg via INTRAVENOUS

## 2014-09-13 MED ORDER — ONDANSETRON HCL 4 MG/2ML IJ SOLN
INTRAMUSCULAR | Status: DC | PRN
  Administered 2014-09-13: 4 mg via INTRAVENOUS

## 2014-09-13 MED ORDER — MIDAZOLAM HCL 2 MG/2ML IJ SOLN: 1.0000 mg | INTRAMUSCULAR | Status: DC | PRN

## 2014-09-13 MED ORDER — EPHEDRINE SULFATE 50 MG/ML IJ SOLN
INTRAMUSCULAR | Status: DC | PRN
  Administered 2014-09-13: 15 mg via INTRAVENOUS
  Administered 2014-09-13: 10 mg via INTRAVENOUS
  Administered 2014-09-13: 15 mg via INTRAVENOUS

## 2014-09-13 MED ORDER — PROPOFOL 10 MG/ML IV BOLUS
INTRAVENOUS | Status: DC | PRN
  Administered 2014-09-13: 200 mg via INTRAVENOUS

## 2014-09-13 MED ORDER — ONDANSETRON HCL 4 MG PO TABS: 4.0000 mg | ORAL_TABLET | Freq: Three times a day (TID) | ORAL | Status: DC | PRN

## 2014-09-13 MED ORDER — FENTANYL CITRATE 0.05 MG/ML IJ SOLN: 50.0000 ug | INTRAMUSCULAR | Status: DC | PRN

## 2014-09-13 MED ORDER — FENTANYL CITRATE 0.05 MG/ML IJ SOLN
INTRAMUSCULAR | Status: AC
  Filled 2014-09-13: qty 4

## 2014-09-13 MED ORDER — BUPIVACAINE HCL (PF) 0.25 % IJ SOLN
INTRAMUSCULAR | Status: DC | PRN
  Administered 2014-09-13: 10 mL

## 2014-09-13 MED ORDER — MIDAZOLAM HCL 2 MG/2ML IJ SOLN
INTRAMUSCULAR | Status: AC
  Filled 2014-09-13: qty 2

## 2014-09-13 MED ORDER — LIDOCAINE HCL (PF) 1 % IJ SOLN
INTRAMUSCULAR | Status: AC
  Filled 2014-09-13: qty 30

## 2014-09-13 MED ORDER — BUPIVACAINE HCL (PF) 0.25 % IJ SOLN
INTRAMUSCULAR | Status: AC
  Filled 2014-09-13: qty 30

## 2014-09-13 MED ORDER — BETAMETHASONE SOD PHOS & ACET 6 (3-3) MG/ML IJ SUSP
INTRAMUSCULAR | Status: DC | PRN
Start: 2014-09-13 — End: 2014-09-13
  Administered 2014-09-13: 1 mL via INTRA_ARTICULAR

## 2014-09-13 MED ORDER — MIDAZOLAM HCL 5 MG/5ML IJ SOLN
INTRAMUSCULAR | Status: DC | PRN
  Administered 2014-09-13: 2 mg via INTRAVENOUS

## 2014-09-13 MED ORDER — LACTATED RINGERS IV SOLN
INTRAVENOUS | Status: DC
  Administered 2014-09-13: 08:00:00 via INTRAVENOUS
  Administered 2014-09-13: 10 mL/h via INTRAVENOUS

## 2014-09-13 SURGICAL SUPPLY — 50 items
BANDAGE ELASTIC 3 VELCRO ST LF (GAUZE/BANDAGES/DRESSINGS) ×4 IMPLANT
BLADE SURG 15 STRL LF DISP TIS (BLADE) ×2 IMPLANT
BLADE SURG 15 STRL SS (BLADE) ×4
BLADE VORTEX 6.0 (BLADE) IMPLANT
BNDG CMPR 9X4 STRL LF SNTH (GAUZE/BANDAGES/DRESSINGS) ×2
BNDG ESMARK 4X9 LF (GAUZE/BANDAGES/DRESSINGS) ×2 IMPLANT
CORDS BIPOLAR (ELECTRODE) IMPLANT
COVER BACK TABLE 60X90IN (DRAPES) ×4 IMPLANT
CUFF TOURNIQUET SINGLE 18IN (TOURNIQUET CUFF) ×2 IMPLANT
DRAPE EXTREMITY T 121X128X90 (DRAPE) ×4 IMPLANT
DRAPE SURG 17X23 STRL (DRAPES) ×4 IMPLANT
DRAPE U 20/CS (DRAPES) ×2 IMPLANT
DURAPREP 26ML APPLICATOR (WOUND CARE) ×4 IMPLANT
GAUZE SPONGE 4X4 12PLY STRL (GAUZE/BANDAGES/DRESSINGS) ×4 IMPLANT
GAUZE XEROFORM 1X8 LF (GAUZE/BANDAGES/DRESSINGS) ×4 IMPLANT
GLOVE BIO SURGEON STRL SZ8 (GLOVE) ×4 IMPLANT
GLOVE BIOGEL PI IND STRL 7.0 (GLOVE) IMPLANT
GLOVE BIOGEL PI IND STRL 8 (GLOVE) ×4 IMPLANT
GLOVE BIOGEL PI INDICATOR 7.0 (GLOVE) ×2
GLOVE BIOGEL PI INDICATOR 8 (GLOVE) ×4
GLOVE ECLIPSE 6.5 STRL STRAW (GLOVE) ×2 IMPLANT
GLOVE ORTHO TXT STRL SZ7.5 (GLOVE) ×4 IMPLANT
GOWN STRL REUS W/ TWL LRG LVL3 (GOWN DISPOSABLE) ×2 IMPLANT
GOWN STRL REUS W/ TWL XL LVL3 (GOWN DISPOSABLE) ×4 IMPLANT
GOWN STRL REUS W/TWL LRG LVL3 (GOWN DISPOSABLE) ×4
GOWN STRL REUS W/TWL XL LVL3 (GOWN DISPOSABLE) ×8
NDL HYPO 25X1 1.5 SAFETY (NEEDLE) IMPLANT
NDL HYPO 25X5/8 SAFETYGLIDE (NEEDLE) IMPLANT
NDL SAFETY ECLIPSE 18X1.5 (NEEDLE) IMPLANT
NEEDLE HYPO 18GX1.5 SHARP (NEEDLE) ×4
NEEDLE HYPO 25X1 1.5 SAFETY (NEEDLE) ×4 IMPLANT
NEEDLE HYPO 25X5/8 SAFETYGLIDE (NEEDLE) ×4 IMPLANT
NS IRRIG 1000ML POUR BTL (IV SOLUTION) ×4 IMPLANT
PACK BASIN DAY SURGERY FS (CUSTOM PROCEDURE TRAY) ×4 IMPLANT
PAD CAST 3X4 CTTN HI CHSV (CAST SUPPLIES) ×2 IMPLANT
PADDING CAST ABS 3INX4YD NS (CAST SUPPLIES)
PADDING CAST ABS 4INX4YD NS (CAST SUPPLIES) ×2
PADDING CAST ABS COTTON 3X4 (CAST SUPPLIES) ×2 IMPLANT
PADDING CAST ABS COTTON 4X4 ST (CAST SUPPLIES) ×2 IMPLANT
PADDING CAST COTTON 3X4 STRL (CAST SUPPLIES) ×4
SPLINT PLASTER CAST XFAST 3X15 (CAST SUPPLIES) IMPLANT
SPLINT PLASTER XTRA FASTSET 3X (CAST SUPPLIES)
STOCKINETTE 4X48 STRL (DRAPES) ×4 IMPLANT
SUT ETHILON 3 0 PS 1 (SUTURE) ×2 IMPLANT
SUT ETHILON 4 0 PS 2 18 (SUTURE) ×2 IMPLANT
SYR 5ML LL (SYRINGE) ×2 IMPLANT
SYR BULB 3OZ (MISCELLANEOUS) ×4 IMPLANT
SYR CONTROL 10ML LL (SYRINGE) IMPLANT
TOWEL OR 17X24 6PK STRL BLUE (TOWEL DISPOSABLE) ×4 IMPLANT
UNDERPAD 30X30 INCONTINENT (UNDERPADS AND DIAPERS) ×4 IMPLANT

## 2014-09-13 NOTE — Anesthesia Postprocedure Evaluation (Signed)
  Anesthesia Post-op Note  Patient: [name]  Procedure(s) Performed: Procedure(s): RIGHT WRIST CARPAL TUNNEL RELEASE (Right) STEROID INJECTION LEFT HAND (Left)  Patient Location: PACU  Anesthesia Type: General   Level of Consciousness: awake, alert  and oriented  Airway and Oxygen Therapy: Patient Spontanous Breathing  Post-op Pain: mild  Post-op Assessment: Post-op Vital signs reviewed  Post-op Vital Signs: Reviewed  Last Vitals:  Filed Vitals:   09/13/14 1030  BP: 144/71  Pulse: 72  Temp: 36.4 C  Resp: 16    Complications: No apparent anesthesia complications

## 2014-09-13 NOTE — Op Note (Signed)
09/13/2014  8:24 AM  PATIENT:  [name] MaesAlice Schoof    PRE-OPERATIVE DIAGNOSIS:  carpal tunnel syndrome right upper limb, left wrist pain  POST-OPERATIVE DIAGNOSIS:  Same  PROCEDURE:  RIGHT WRIST CARPAL TUNNEL RELEASE, STEROID INJECTION LEFT HAND  SURGEON:  Eulas PostLANDAU,Jalik Gellatly P, MD  PHYSICIAN ASSISTANT: Janace LittenBrandon Parry, OPA-C, present and scrubbed throughout the case, critical for completion in a timely fashion, and for retraction, instrumentation, and closure.  ANESTHESIA:   General  PREOPERATIVE INDICATIONS:  [name] Maeslice Steckman is a  63 y.o. female with a diagnosis of carpal tunnel syndrome right upper limb and left-sided wrist pain, possible gout who failed conservative measures and elected for surgical management.    The risks benefits and alternatives were discussed with the patient preoperatively including but not limited to the risks of infection, bleeding, nerve injury, incomplete relief of symptoms, pillar pain, cardiopulmonary complications, the need for revision surgery, among others, and the patient was willing to proceed.  OPERATIVE FINDINGS: no significant atrophy of median nerve  OPERATIVE PROCEDURE: The patient is brought to the operating room placed in the supine position. General anesthesia was administered. The left upper extremity was prepped with alcohol and a 27-gauge needle was used to inject 3 mL of Xylocaine with 40 mg of Celestone after time out was performed. This was injected into the radial carpal joint from a dorsal approach.  The right upper extremity was then prepped and draped in usual sterile fashion. Time out was performed. The arm was elevated and exsanguinated and the tourniquet was inflated. Incision was made in line with the radial border of the ring finger. The carpal tunnel transverse fascia was identified, cleaned, and incised sharply. The common sensory branches were visualized along with the superficial palmar arch and protected.  The median nerve was protected below.  Deep retractors were placed underneath the transverse carpal ligament, protecting the nerve. I released the ligament completely, and then released the proximal distal volar forearm fascia. The nerve was identified, and visualized, and protected throughout the case. No masses or abnormalities were identified in ulnar bursa.  The wounds were irrigated copiously, and the wounds injected, and the skin closed with nylon followed by a volar splint and sterile gauze. She tolerated this well, with no complications.

## 2014-09-13 NOTE — H&P (Signed)
PREOPERATIVE H&P  Chief Complaint: carpal tunnel syndrome right upper limb, left wrist pain  HPI: [name] is a 63 y.o. female who presents for preoperative history and physical with a diagnosis of carpal tunnel syndrome right upper limb. Symptoms are rated as moderate to severe, and have been worsening.  This is significantly impairing activities of daily living.  She has elected for surgical management. Preop NCS showed CTS.  Failed injections and bracing and nonsurgical mgmt.  Also has left wrist pain with movement, h/o gout.    Past Medical History  Diagnosis Date  . Hypertension   . Peripheral neuropathy   . Cervicalgia   . Low back pain syndrome   . Hyperlipidemia   . Vitamin D deficiency   . Vitamin B12 deficiency   . Iliotibial band syndrome   . Anemia   . Borderline personality disorder   . Nonorganic psychosis   . GERD (gastroesophageal reflux disease)   . Diabetes mellitus   . Depression   . Diabetic gastroparesis   . CAD (coronary artery disease)     Catherization 11/18/09>nonobstructive CAD , normal LV function, EF 65%  . Health maintenance examination     Colonoscopy 2009> normal; Diabetic eye exam 12/2008. No diabetic retinopathy; Mammogram 11/10> No evidence of malignancy, DXA 03/14/13 : normal  . Headache(784.0)   . Arthritis   . SBO (small bowel obstruction) 01/09/2013  . Diastolic CHF     Grade I, on Echo 06/2013, EF 55-60%  . Sleep apnea     does not wear CPAP   . Diabetes mellitus, type II   . Wears glasses   . Wears dentures     upper  . Gout    Past Surgical History  Procedure Laterality Date  . Abdominal hysterectomy    . Hand surgery  1992    rt  . Bowel resection N/A 01/10/2013    Procedure: SMALL BOWEL RESECTION;  Surgeon: Lodema PilotBrian Layton, DO;  Location: [facility];  Service: General;  Laterality: N/A;  . Lysis of adhesion N/A 01/10/2013    Procedure: LYSIS OF ADHESION;  Surgeon: Lodema PilotBrian Layton, DO;  Location: [facility];  Service: General;  Laterality: N/A;   . Laparotomy N/A 01/10/2013    Procedure: EXPLORATORY LAPAROTOMY;  Surgeon: Lodema PilotBrian Layton, DO;  Location: [facility];  Service: General;  Laterality: N/A;  . Incisional hernia repair N/A 03/01/2014    Procedure: LAPAROSCOPIC INCISIONAL HERNIA;  Surgeon: Axel FillerArmando Ramirez, MD;  Location: [facility];  Service: General;  Laterality: N/A;  . Insertion of mesh N/A 03/01/2014    Procedure: INSERTION OF MESH;  Surgeon: Axel FillerArmando Ramirez, MD;  Location: [facility];  Service: General;  Laterality: N/A;  . Hernia repair  03/01/14    Lap incisional hernia repair w/mesh  . Appendectomy     History   Social History  . Marital Status: Married    Spouse Name: N/A    Number of Children: N/A  . Years of Education: N/A   Social History Main Topics  . Smoking status: Former Smoker    Types: Cigarettes    Quit date: 10/25/2002  . Smokeless tobacco: Never Used  . Alcohol Use: No  . Drug Use: No  . Sexual Activity: Yes   Other Topics Concern  . None   Social History Narrative   Family History  Problem Relation Age of Onset  . Diabetes Mother   . Hypertension Mother   . Hyperlipidemia Mother   . Arthritis Mother   . Depression Mother   .  Heart disease Father   . Diabetes Sister   . Diabetes Brother   . Heart disease Brother   . Heart disease Paternal Grandmother   . Seizures Brother    Allergies  Allergen Reactions  . Cimetidine Other (See Comments)    Breast swelling  . Indomethacin Diarrhea    Caused severe diarrhea with episodes of incontinance  . Penicillins Hives and Itching    Other reaction(s): Delusions (intolerance)  . Indomethacin Other (See Comments)    Severe diarrhea  . Sulfonamide Derivatives     unknown   Prior to Admission medications   Medication Sig Start Date End Date Taking? Authorizing Provider  colchicine 0.6 MG tablet Take 0.6 mg by mouth daily.   Yes Historical Provider, MD  ACCU-CHEK FASTCLIX LANCETS MISC Use to Check Blood Sugars up to 4 Times a Day. ICD-10 Code: E11.65  08/07/14   Gara Kroneriana Truong, MD  acetaminophen-codeine (TYLENOL #4) 300-60 MG per tablet Take 1 tablet by mouth 3 (three) times daily. 07/09/14   Erick ColaceAndrew E Kirsteins, MD  aspirin 81 MG chewable tablet Chew 81 mg by mouth daily.    Historical Provider, MD  buPROPion (WELLBUTRIN XL) 150 MG 24 hr tablet Take 3 tablets (450 mg total) by mouth every morning. 09/05/14   Archer AsaGerald Plovsky, MD  busPIRone (BUSPAR) 10 MG tablet Take 10-20 mg by mouth 2 (two) times daily. Take 15 mg every morning and30 mg every evening    Historical Provider, MD  busPIRone (BUSPAR) 15 MG tablet 1 qam 1  q dinner  With food for 1 week then 1 qam 2 qpm with dinner therafter 09/05/14   Archer AsaGerald Plovsky, MD  cloNIDine (CATAPRES - DOSED IN MG/24 HR) 0.2 mg/24hr patch PLACE 1 PATCH ONTO THE SKIN EVERY 7 DAYS Patient not taking: Reported on 08/28/2014 08/07/14   Gara Kroneriana Truong, MD  cloNIDine (CATAPRES - DOSED IN MG/24 HR) 0.2 mg/24hr patch Place 0.2 mg onto the skin every Tuesday.    Historical Provider, MD  dexlansoprazole (DEXILANT) 60 MG capsule Take 60 mg by mouth daily.     Historical Provider, MD  Docusate Sodium (COLACE PO) Take 2 tablets by mouth daily as needed (for constipation).     Historical Provider, MD  furosemide (LASIX) 40 MG tablet Take 1 tablet (40 mg total) by mouth every morning. 06/04/14   Gara Kroneriana Truong, MD  gabapentin (NEURONTIN) 800 MG tablet Take 1 tablet (800 mg total) by mouth as directed. Patient taking differently: Take 800 mg by mouth as directed. 800mg  am and 1600mg  evening 05/03/14   Inez CatalinaEmily B Mullen, MD  glucose blood (ACCU-CHEK AVIVA PLUS) test strip Check blood sugar up to 4 times a day as instructed 09/03/14   Earl LagosNischal Narendra, MD  insulin aspart (NOVOLOG FLEXPEN) 100 UNIT/ML FlexPen inject with meals and snacks up to 8 times a day as directed by your aviva glucometer. 09/03/14   Gara Kroneriana Truong, MD  insulin aspart (NOVOLOG) 100 UNIT/ML injection Inject into the skin 3 (three) times daily before meals. Sliding scale  according to carb intake    Historical Provider, MD  Insulin Glargine (LANTUS SOLOSTAR) 100 UNIT/ML Solostar Pen Inject 35 Units into the skin daily at 10 pm. Patient taking differently: Inject 47 Units into the skin daily at 10 pm. If below 250 -43u 08/14/14   Marrian SalvageJacquelyn S Gill, MD  Insulin Pen Needle (BD PEN NEEDLE NANO U/F) 32G X 4 MM MISC USE TO TEST UP TO 5 TIMES A DAY ACCORDING TO CBG 09/03/14  Earl Lagos, MD  isosorbide mononitrate (IMDUR) 60 MG 24 hr tablet Take 1 tablet (60 mg total) by mouth every morning. 06/18/14   Gara Kroner, MD  losartan (COZAAR) 100 MG tablet Take 50 mg by mouth every morning.    Historical Provider, MD  metoprolol succinate (TOPROL-XL) 25 MG 24 hr tablet Take 1 tablet (25 mg total) by mouth every morning. 06/18/14   Gara Kroner, MD  ondansetron (ZOFRAN) 4 MG tablet Take 4 mg by mouth every 8 (eight) hours as needed for nausea.  04/11/14   Historical Provider, MD  rosuvastatin (CRESTOR) 10 MG tablet Take 1 tablet (10 mg total) by mouth at bedtime. 03/08/14 03/08/15  Belia Heman, MD  senna (SENOKOT) 8.6 MG TABS Take 4 tablets by mouth daily as needed (for constipation).     Historical Provider, MD  tiZANidine (ZANAFLEX) 2 MG tablet Take 1 tablet (2 mg total) by mouth every 8 (eight) hours as needed for muscle spasms. 09/10/14   Erick Colace, MD     Positive ROS: All other systems have been reviewed and were otherwise negative with the exception of those mentioned in the HPI and as above.  Physical Exam: General: Alert, no acute distress Cardiovascular: No pedal edema Respiratory: No cyanosis, no use of accessory musculature GI: No organomegaly, abdomen is soft and non-tender Skin: No lesions in the area of chief complaint Neurologic: Mild decreased sensation in the median nerve distribution of the right hand.  Psychiatric: Patient is competent for consent with normal mood and affect Lymphatic: No axillary or cervical  lymphadenopathy  MUSCULOSKELETAL: Right hand with positive Phalen's test, thenar eminences still intact, left wrist has pain with dorsiflexion and palmar flexion, no redness or cellulitis.  Assessment: carpal tunnel syndrome right upper limb, left wrist pain, possible gout  Plan: Plan for Procedure(s): RIGHT WRIST CARPAL TUNNEL RELEASE, left wrist intra-articular injection  The risks benefits and alternatives were discussed with the patient including but not limited to the risks of nonoperative treatment, versus surgical intervention including infection, bleeding, nerve injury,  blood clots, cardiopulmonary complications, morbidity, mortality, among others, and they were willing to proceed.   Eulas Post, MD Cell [phone]   09/13/2014 6:07 AM

## 2014-09-13 NOTE — Discharge Instructions (Signed)
Diet: As you were doing prior to hospitalization   Shower:  May shower but keep the wounds dry, use an occlusive plastic wrap, NO SOAKING IN TUB.  If the bandage gets wet, change with a clean dry gauze.  Dressing:  Leave splint intact  There are sticky tapes (steri-strips) on your wounds and all the stitches are absorbable.  Leave the steri-strips in place when changing your dressings, they will peel off with time, usually 2-3 weeks.  Activity:  Increase activity slowly as tolerated, but follow the weight bearing instructions below.  No lifting or driving for 6 weeks.  Weight Bearing:   As tolerated.    To prevent constipation: you may use a stool softener such as -  Colace (over the counter) 100 mg by mouth twice a day  Drink plenty of fluids (prune juice may be helpful) and high fiber foods Miralax (over the counter) for constipation as needed.    Itching:  If you experience itching with your medications, try taking only a single pain pill, or even half a pain pill at a time.  You may take up to 10 pain pills per day, and you can also use benadryl over the counter for itching or also to help with sleep.   Precautions:  If you experience chest pain or shortness of breath - call 911 immediately for transfer to the hospital emergency department!!  If you develop a fever greater that 101 F, purulent drainage from wound, increased redness or drainage from wound, or calf pain -- Call the office at [phone]                                                Follow- Up Appointment:  Please call for an [place] [phone]   Post Anesthesia Home Care Instructions  Activity: Get plenty of rest for the remainder of the day. A responsible adult should stay with you for 24 hours following the procedure.  For the next 24 hours, DO NOT: -Drive a car -Advertising copywriterperate machinery -Drink alcoholic beverages -Take any medication unless instructed by your  physician -Make any legal decisions or sign important papers.  Meals: Start with liquid foods such as gelatin or soup. Progress to regular foods as tolerated. Avoid greasy, spicy, heavy foods. If nausea and/or vomiting occur, drink only clear liquids until the nausea and/or vomiting subsides. Call your physician if vomiting continues.  Special Instructions/Symptoms: Your throat may feel dry or sore from the anesthesia or the breathing tube placed in your throat during surgery. If this causes discomfort, gargle with warm salt water. The discomfort should disappear within 24 hours.

## 2014-09-13 NOTE — Anesthesia Preprocedure Evaluation (Addendum)
Anesthesia Evaluation  Patient identified by MRN, date of birth, ID band Patient awake    Reviewed: Allergy & Precautions, H&P , NPO status , Patient's Chart, lab work & pertinent test results  Airway Mallampati: I  TM Distance: >3 FB Neck ROM: Full    Dental  (+) Teeth Intact, Upper Dentures   Pulmonary Sleep apnea: o need for CPAP after repeat testing. , former smoker,  breath sounds clear to auscultation        Cardiovascular hypertension, Pt. on medications + CAD + Valvular Problems/Murmurs AI Rhythm:Regular Rate:Normal     Neuro/Psych    GI/Hepatic GERD-  Medicated and Controlled,  Endo/Other  diabetes  Renal/GU      Musculoskeletal   Abdominal   Peds  Hematology   Anesthesia Other Findings   Reproductive/Obstetrics                             Anesthesia Physical Anesthesia Plan  ASA: III  Anesthesia Plan: General   Post-op Pain Management:    Induction: Intravenous  Airway Management Planned: LMA  Additional Equipment:   Intra-op Plan:   Post-operative Plan: Extubation in OR  Informed Consent: I have reviewed the patients History and Physical, chart, labs and discussed the procedure including the risks, benefits and alternatives for the proposed anesthesia with the patient or authorized representative who has indicated his/her understanding and acceptance.   Dental advisory given  Plan Discussed with: CRNA, Surgeon and Anesthesiologist  Anesthesia Plan Comments: (Will not use Decadron due to DM)       Anesthesia Quick Evaluation

## 2014-09-13 NOTE — Anesthesia Procedure Notes (Signed)
Procedure Name: LMA Insertion Performed by: Elaysha Bevard W Pre-anesthesia Checklist: Patient identified, Timeout performed, Emergency Drugs available, Suction available and Patient being monitored Patient Re-evaluated:Patient Re-evaluated prior to inductionOxygen Delivery Method: Circle system utilized Preoxygenation: Pre-oxygenation with 100% oxygen Intubation Type: IV induction Ventilation: Mask ventilation without difficulty LMA: LMA inserted LMA Size: 4.0 Tube type: Oral Number of attempts: 1 Placement Confirmation: positive ETCO2 Tube secured with: Tape Dental Injury: Teeth and Oropharynx as per pre-operative assessment      

## 2014-09-13 NOTE — Transfer of Care (Signed)
Immediate Anesthesia Transfer of Care Note  Patient: [name]  Procedure(s) Performed: Procedure(s): RIGHT WRIST CARPAL TUNNEL RELEASE (Right) STEROID INJECTION LEFT HAND (Left)  Patient Location: PACU  Anesthesia Type:General  Level of Consciousness: sedated  Airway & Oxygen Therapy: Patient Spontanous Breathing and Patient connected to face mask oxygen  Post-op Assessment: Report given to PACU RN and Post -op Vital signs reviewed and stable  Post vital signs: Reviewed and stable  Complications: No apparent anesthesia complications

## 2014-09-14 LAB — OPIATES/OPIOIDS (LC/MS-MS)
CODEINE URINE: 289 ng/mL — AB (ref <50)
HYDROCODONE: NEGATIVE ng/mL (ref <50)
Hydromorphone: NEGATIVE ng/mL (ref <50)
Morphine Urine: NEGATIVE ng/mL (ref <50)
NORHYDROCODONE, UR: NEGATIVE ng/mL (ref <50)
Noroxycodone, Ur: NEGATIVE ng/mL — AB (ref <50)
Oxycodone, ur: NEGATIVE ng/mL — AB (ref <50)
Oxymorphone: NEGATIVE ng/mL — AB (ref <50)

## 2014-09-16 ENCOUNTER — Other Ambulatory Visit: Payer: Self-pay | Admitting: Physical Medicine & Rehabilitation

## 2014-09-16 ENCOUNTER — Encounter (HOSPITAL_BASED_OUTPATIENT_CLINIC_OR_DEPARTMENT_OTHER): Payer: Self-pay | Admitting: Orthopedic Surgery

## 2014-09-16 NOTE — Telephone Encounter (Signed)
Pharmacy requesting refill on her tylenol #4.  By your note 09/10/14 I am not sure whether to refill.  She has follow up appt 10/01/14 for lumbar injection.  Looks like in notes she has recently had CTS release (09/13/14)  Please advise

## 2014-09-17 NOTE — Telephone Encounter (Addendum)
Tylenol # 4 approved by Dr Wynn BankerKirsteins. (he printed rx but I destroyed it and called this in to the pharmacy) [facility] [address]

## 2014-09-19 ENCOUNTER — Emergency Department (HOSPITAL_COMMUNITY)
Admission: EM | Admit: 2014-09-19 | Discharge: 2014-09-20 | Disposition: A | Discharge status: Home / Self Care | Payer: PRIVATE HEALTH INSURANCE | Attending: Emergency Medicine | Admitting: Emergency Medicine

## 2014-09-19 ENCOUNTER — Emergency Department (HOSPITAL_COMMUNITY): Payer: PRIVATE HEALTH INSURANCE

## 2014-09-19 ENCOUNTER — Encounter (HOSPITAL_COMMUNITY): Payer: Self-pay | Admitting: Emergency Medicine

## 2014-09-19 DIAGNOSIS — E119 Type 2 diabetes mellitus without complications: Secondary | ICD-10-CM | POA: Diagnosis not present

## 2014-09-19 DIAGNOSIS — Z862 Personal history of diseases of the blood and blood-forming organs and certain disorders involving the immune mechanism: Secondary | ICD-10-CM | POA: Insufficient documentation

## 2014-09-19 DIAGNOSIS — B349 Viral infection, unspecified: Secondary | ICD-10-CM | POA: Insufficient documentation

## 2014-09-19 DIAGNOSIS — M1990 Unspecified osteoarthritis, unspecified site: Secondary | ICD-10-CM | POA: Diagnosis not present

## 2014-09-19 DIAGNOSIS — F329 Major depressive disorder, single episode, unspecified: Secondary | ICD-10-CM | POA: Diagnosis not present

## 2014-09-19 DIAGNOSIS — E785 Hyperlipidemia, unspecified: Secondary | ICD-10-CM | POA: Insufficient documentation

## 2014-09-19 DIAGNOSIS — Z7982 Long term (current) use of aspirin: Secondary | ICD-10-CM | POA: Insufficient documentation

## 2014-09-19 DIAGNOSIS — I5030 Unspecified diastolic (congestive) heart failure: Secondary | ICD-10-CM | POA: Insufficient documentation

## 2014-09-19 DIAGNOSIS — R05 Cough: Secondary | ICD-10-CM

## 2014-09-19 DIAGNOSIS — Z79899 Other long term (current) drug therapy: Secondary | ICD-10-CM | POA: Insufficient documentation

## 2014-09-19 DIAGNOSIS — R509 Fever, unspecified: Secondary | ICD-10-CM

## 2014-09-19 DIAGNOSIS — Z87891 Personal history of nicotine dependence: Secondary | ICD-10-CM | POA: Diagnosis not present

## 2014-09-19 DIAGNOSIS — I10 Essential (primary) hypertension: Secondary | ICD-10-CM | POA: Diagnosis not present

## 2014-09-19 DIAGNOSIS — Z973 Presence of spectacles and contact lenses: Secondary | ICD-10-CM | POA: Insufficient documentation

## 2014-09-19 DIAGNOSIS — K219 Gastro-esophageal reflux disease without esophagitis: Secondary | ICD-10-CM | POA: Diagnosis not present

## 2014-09-19 DIAGNOSIS — I2510 Atherosclerotic heart disease of native coronary artery without angina pectoris: Secondary | ICD-10-CM | POA: Insufficient documentation

## 2014-09-19 DIAGNOSIS — Z880 Allergy status to penicillin: Secondary | ICD-10-CM | POA: Diagnosis not present

## 2014-09-19 DIAGNOSIS — Z972 Presence of dental prosthetic device (complete) (partial): Secondary | ICD-10-CM | POA: Diagnosis not present

## 2014-09-19 DIAGNOSIS — Z8669 Personal history of other diseases of the nervous system and sense organs: Secondary | ICD-10-CM | POA: Insufficient documentation

## 2014-09-19 DIAGNOSIS — R058 Other specified cough: Secondary | ICD-10-CM

## 2014-09-19 DIAGNOSIS — Z794 Long term (current) use of insulin: Secondary | ICD-10-CM | POA: Insufficient documentation

## 2014-09-19 DIAGNOSIS — M545 Low back pain: Secondary | ICD-10-CM | POA: Diagnosis not present

## 2014-09-19 LAB — URINE MICROSCOPIC-ADD ON

## 2014-09-19 LAB — CBC WITH DIFFERENTIAL/PLATELET
BASOS ABS: 0 10*3/uL (ref 0.0–0.1)
Basophils Relative: 0 % (ref 0–1)
Eosinophils Absolute: 0.1 10*3/uL (ref 0.0–0.7)
Eosinophils Relative: 1 % (ref 0–5)
HEMATOCRIT: 35.8 % — AB (ref 36.0–46.0)
Hemoglobin: 11.6 g/dL — ABNORMAL LOW (ref 12.0–15.0)
LYMPHS ABS: 2.3 10*3/uL (ref 0.7–4.0)
LYMPHS PCT: 40 % (ref 12–46)
MCH: 24.5 pg — ABNORMAL LOW (ref 26.0–34.0)
MCHC: 32.4 g/dL (ref 30.0–36.0)
MCV: 75.5 fL — ABNORMAL LOW (ref 78.0–100.0)
MONO ABS: 0.4 10*3/uL (ref 0.1–1.0)
MONOS PCT: 7 % (ref 3–12)
NEUTROS ABS: 2.9 10*3/uL (ref 1.7–7.7)
Neutrophils Relative %: 51 % (ref 43–77)
Platelets: 151 10*3/uL (ref 150–400)
RBC: 4.74 MIL/uL (ref 3.87–5.11)
RDW: 17.3 % — ABNORMAL HIGH (ref 11.5–15.5)
WBC: 5.7 10*3/uL (ref 4.0–10.5)

## 2014-09-19 LAB — COMPREHENSIVE METABOLIC PANEL
ALT: 18 U/L (ref 0–35)
AST: 23 U/L (ref 0–37)
Albumin: 3.5 g/dL (ref 3.5–5.2)
Alkaline Phosphatase: 82 U/L (ref 39–117)
Anion gap: 17 — ABNORMAL HIGH (ref 5–15)
BUN: 24 mg/dL — AB (ref 6–23)
CHLORIDE: 97 meq/L (ref 96–112)
CO2: 22 meq/L (ref 19–32)
CREATININE: 1.81 mg/dL — AB (ref 0.50–1.10)
Calcium: 9.2 mg/dL (ref 8.4–10.5)
GFR, EST AFRICAN AMERICAN: 33 mL/min — AB (ref >90)
GFR, EST NON AFRICAN AMERICAN: 29 mL/min — AB (ref >90)
GLUCOSE: 61 mg/dL — AB (ref 70–99)
Potassium: 4.5 mEq/L (ref 3.7–5.3)
Sodium: 136 mEq/L — ABNORMAL LOW (ref 137–147)
Total Protein: 8 g/dL (ref 6.0–8.3)

## 2014-09-19 LAB — URINALYSIS, ROUTINE W REFLEX MICROSCOPIC
BILIRUBIN URINE: NEGATIVE
Glucose, UA: NEGATIVE mg/dL
Hgb urine dipstick: NEGATIVE
Ketones, ur: NEGATIVE mg/dL
Leukocytes, UA: NEGATIVE
NITRITE: NEGATIVE
PH: 6 (ref 5.0–8.0)
Protein, ur: 30 mg/dL — AB
SPECIFIC GRAVITY, URINE: 1.018 (ref 1.005–1.030)
UROBILINOGEN UA: 0.2 mg/dL (ref 0.0–1.0)

## 2014-09-19 LAB — I-STAT CG4 LACTIC ACID, ED: LACTIC ACID, VENOUS: 2.73 mmol/L — AB (ref 0.5–2.2)

## 2014-09-19 MED ORDER — ACETAMINOPHEN 325 MG PO TABS
650.0000 mg | ORAL_TABLET | Freq: Once | ORAL | Status: AC
  Administered 2014-09-19: 650 mg via ORAL
  Filled 2014-09-19: qty 2

## 2014-09-19 MED ORDER — SODIUM CHLORIDE 0.9 % IV SOLN
INTRAVENOUS | Status: DC
  Administered 2014-09-19: 22:00:00 via INTRAVENOUS

## 2014-09-19 NOTE — ED Provider Notes (Signed)
TIME SEEN: 9:50 PM  CHIEF COMPLAINT: Cough, fever, body aches  HPI: Pt is a 63 y.o. F with history of hypertension, hyperlipidemia, coronary artery disease, diabetes who presents to the emergency department with complaints of cough for the past 2 days, chest pain with coughing, fever that started tonight, throbbing headache, body aches. Patient denies any vomiting or diarrhea. She does have some abdominal pain from coughing. No sick contacts or recent travel. She has not had influenza vaccination this year. She recently underwent right carpal tunnel release with Dr. Dion SaucierLandau 6 days ago but reports that she has no hand or wrist pain.  ROS: See HPI Constitutional:  fever  Eyes: no drainage  ENT: no runny nose   Cardiovascular:  chest pain  Resp: no SOB  GI: no vomiting GU: no dysuria Integumentary: no rash  Allergy: no hives  Musculoskeletal: no leg swelling  Neurological: no slurred speech ROS otherwise negative  PAST MEDICAL HISTORY/PAST SURGICAL HISTORY:  Past Medical History  Diagnosis Date  . Hypertension   . Peripheral neuropathy   . Cervicalgia   . Low back pain syndrome   . Hyperlipidemia   . Vitamin D deficiency   . Vitamin B12 deficiency   . Iliotibial band syndrome   . Anemia   . Borderline personality disorder   . Nonorganic psychosis   . GERD (gastroesophageal reflux disease)   . Diabetes mellitus   . Depression   . Diabetic gastroparesis   . CAD (coronary artery disease)     Catherization 11/18/09>nonobstructive CAD , normal LV function, EF 65%  . Health maintenance examination     Colonoscopy 2009> normal; Diabetic eye exam 12/2008. No diabetic retinopathy; Mammogram 11/10> No evidence of malignancy, DXA 03/14/13 : normal  . Headache(784.0)   . Arthritis   . SBO (small bowel obstruction) 01/09/2013  . Diastolic CHF     Grade I, on Echo 06/2013, EF 55-60%  . Sleep apnea     does not wear CPAP   . Diabetes mellitus, type II   . Wears glasses   . Wears dentures      upper  . Gout   . Right carpal tunnel syndrome 09/13/2014    MEDICATIONS:  Prior to Admission medications   Medication Sig Start Date End Date Taking? Authorizing Provider  ACCU-CHEK FASTCLIX LANCETS MISC Use to Check Blood Sugars up to 4 Times a Day. ICD-10 Code: E11.65 08/07/14   Gara Kroneriana Truong, MD  acetaminophen-codeine (TYLENOL #4) 300-60 MG per tablet take 1 tablet by mouth three times a day 09/16/14   Erick ColaceAndrew E Kirsteins, MD  aspirin 81 MG chewable tablet Chew 81 mg by mouth daily.    Historical Provider, MD  buPROPion (WELLBUTRIN XL) 150 MG 24 hr tablet Take 3 tablets (450 mg total) by mouth every morning. 09/05/14   Archer AsaGerald Plovsky, MD  busPIRone (BUSPAR) 10 MG tablet Take 10-20 mg by mouth 2 (two) times daily. Take 15 mg every morning and30 mg every evening    Historical Provider, MD  busPIRone (BUSPAR) 15 MG tablet 1 qam 1  q dinner  With food for 1 week then 1 qam 2 qpm with dinner therafter 09/05/14   Archer AsaGerald Plovsky, MD  cloNIDine (CATAPRES - DOSED IN MG/24 HR) 0.2 mg/24hr patch PLACE 1 PATCH ONTO THE SKIN EVERY 7 DAYS Patient not taking: Reported on 08/28/2014 08/07/14   Gara Kroneriana Truong, MD  cloNIDine (CATAPRES - DOSED IN MG/24 HR) 0.2 mg/24hr patch Place 0.2 mg onto the skin every Tuesday.  Historical Provider, MD  colchicine 0.6 MG tablet Take 0.6 mg by mouth daily.    Historical Provider, MD  dexlansoprazole (DEXILANT) 60 MG capsule Take 60 mg by mouth daily.     Historical Provider, MD  Docusate Sodium (COLACE PO) Take 2 tablets by mouth daily as needed (for constipation).     Historical Provider, MD  furosemide (LASIX) 40 MG tablet Take 1 tablet (40 mg total) by mouth every morning. 06/04/14   Gara Kroner, MD  gabapentin (NEURONTIN) 800 MG tablet Take 1 tablet (800 mg total) by mouth as directed. Patient taking differently: Take 800 mg by mouth as directed. 800mg  am and 1600mg  evening 05/03/14   Inez Catalina, MD  glucose blood (ACCU-CHEK AVIVA PLUS) test strip Check blood  sugar up to 4 times a day as instructed 09/03/14   Earl Lagos, MD  insulin aspart (NOVOLOG FLEXPEN) 100 UNIT/ML FlexPen inject with meals and snacks up to 8 times a day as directed by your aviva glucometer. 09/03/14   Gara Kroner, MD  insulin aspart (NOVOLOG) 100 UNIT/ML injection Inject into the skin 3 (three) times daily before meals. Sliding scale according to carb intake    Historical Provider, MD  Insulin Glargine (LANTUS SOLOSTAR) 100 UNIT/ML Solostar Pen Inject 35 Units into the skin daily at 10 pm. Patient taking differently: Inject 47 Units into the skin daily at 10 pm. If below 250 -43u 08/14/14   Marrian Salvage, MD  Insulin Pen Needle (BD PEN NEEDLE NANO U/F) 32G X 4 MM MISC USE TO TEST UP TO 5 TIMES A DAY ACCORDING TO CBG 09/03/14   Nischal Narendra, MD  isosorbide mononitrate (IMDUR) 60 MG 24 hr tablet Take 1 tablet (60 mg total) by mouth every morning. 06/18/14   Gara Kroner, MD  losartan (COZAAR) 100 MG tablet Take 50 mg by mouth every morning.    Historical Provider, MD  metoprolol succinate (TOPROL-XL) 25 MG 24 hr tablet Take 1 tablet (25 mg total) by mouth every morning. 06/18/14   Gara Kroner, MD  ondansetron (ZOFRAN) 4 MG tablet Take 4 mg by mouth every 8 (eight) hours as needed for nausea.  04/11/14   Historical Provider, MD  oxyCODONE-acetaminophen (ROXICET) 5-325 MG per tablet Take 1-2 tablets by mouth every 6 (six) hours as needed for severe pain. 09/13/14   Eulas Post, MD  rosuvastatin (CRESTOR) 10 MG tablet Take 1 tablet (10 mg total) by mouth at bedtime. 03/08/14 03/08/15  Belia Heman, MD  senna (SENOKOT) 8.6 MG TABS Take 4 tablets by mouth daily as needed (for constipation).     Historical Provider, MD  sennosides-docusate sodium (SENOKOT-S) 8.6-50 MG tablet Take 2 tablets by mouth daily. 09/13/14   Eulas Post, MD  tiZANidine (ZANAFLEX) 2 MG tablet Take 1 tablet (2 mg total) by mouth every 8 (eight) hours as needed for muscle spasms. 09/10/14   Erick Colace, MD    ALLERGIES:  Allergies  Allergen Reactions  . Cimetidine Other (See Comments)    Breast swelling  . Indomethacin Diarrhea    Caused severe diarrhea with episodes of incontinance  . Penicillins Hives and Itching    Other reaction(s): Delusions (intolerance)  . Indomethacin Other (See Comments)    Severe diarrhea  . Sulfonamide Derivatives     unknown    SOCIAL HISTORY:  History  Substance Use Topics  . Smoking status: Former Smoker    Types: Cigarettes    Quit date: 10/25/2002  . Smokeless  tobacco: Never Used  . Alcohol Use: No    FAMILY HISTORY: Family History  Problem Relation Age of Onset  . Diabetes Mother   . Hypertension Mother   . Hyperlipidemia Mother   . Arthritis Mother   . Depression Mother   . Heart disease Father   . Diabetes Sister   . Diabetes Brother   . Heart disease Brother   . Heart disease Paternal Grandmother   . Seizures Brother     EXAM: BP 144/63 mmHg  Pulse 95  Temp(Src) 102.8 F (39.3 C) (Oral)  Resp 16  SpO2 97% CONSTITUTIONAL: Alert and oriented and responds appropriately to questions. Well-appearing; well-nourished; nontoxic HEAD: Normocephalic EYES: Conjunctivae clear, PERRL ENT: normal nose; no rhinorrhea; moist mucous membranes; pharynx without lesions noted NECK: Supple, no meningismus, no LAD  CARD: Regular and tachycardic; S1 and S2 appreciated; no murmurs, no clicks, no rubs, no gallops RESP: Normal chest excursion without splinting or tachypnea; breath sounds equal bilaterally, no hypoxia or respiratory distress, speaking full sentences, patient does have some crackles at her right lung base, no wheezing or rhonchi ABD/GI: Normal bowel sounds; non-distended; soft, non-tender, no rebound, no guarding BACK:  The back appears normal and is non-tender to palpation, there is no CVA tenderness EXT: Normal ROM in all joints; non-tender to palpation; no edema; normal capillary refill; no cyanosis    SKIN: Normal  color for age and race; warm NEURO: Moves all extremities equally PSYCH: The patient's mood and manner are appropriate. Grooming and personal hygiene are appropriate.  MEDICAL DECISION MAKING: Pt here with cough, fever, myalgias. Different diagnosis includes pneumonia, influenza, viral URI. We'll obtain labs, urine, chest x-ray. We'll give Tylenol, IV fluids. She is otherwise well-appearing, nontoxic, no meningismus, no respiratory distress.  ED PROGRESS: Patient's labs are unremarkable other than very mild elevation of her lactate. She has received IV fluids. Creatinine is at baseline. No leukocytosis. Urine shows no sign of infection. Chest x-ray shows bronchitic changes without infiltrate. Suspect viral illness. Will discharge home with instructions to use Tylenol as needed for fever, pain, encourage oral fluids. She verbalized understanding and is comfortable with plan.    Date: 09/19/2014 21:56  Rate: 90  Rhythm: normal sinus rhythm  QRS Axis: normal  Intervals: normal  ST/T Wave abnormalities: normal  Conduction Disutrbances: none  Narrative Interpretation: unremarkable; artifact, no ischemic changes      Layla MawKristen N Shemuel Harkleroad, DO 09/19/14 [id number]

## 2014-09-19 NOTE — Discharge Instructions (Signed)
You may take Tylenol 1000 mg every 6 hours as needed for fever and pain. This medication is found over-the-counter.    Viral Infections A viral infection can be caused by different types of viruses.Most viral infections are not serious and resolve on their own. However, some infections may cause severe symptoms and may lead to further complications. SYMPTOMS Viruses can frequently cause:  Minor sore throat.  Aches and pains.  Headaches.  Runny nose.  Different types of rashes.  Watery eyes.  Tiredness.  Cough.  Loss of appetite.  Gastrointestinal infections, resulting in nausea, vomiting, and diarrhea. These symptoms do not respond to antibiotics because the infection is not caused by bacteria. However, you might catch a bacterial infection following the viral infection. This is sometimes called a "superinfection." Symptoms of such a bacterial infection may include:  Worsening sore throat with pus and difficulty swallowing.  Swollen neck glands.  Chills and a high or persistent fever.  Severe headache.  Tenderness over the sinuses.  Persistent overall ill feeling (malaise), muscle aches, and tiredness (fatigue).  Persistent cough.  Yellow, green, or brown mucus production with coughing. HOME CARE INSTRUCTIONS   Only take over-the-counter or prescription medicines for pain, discomfort, diarrhea, or fever as directed by your caregiver.  Drink enough water and fluids to keep your urine clear or pale yellow. Sports drinks can provide valuable electrolytes, sugars, and hydration.  Get plenty of rest and maintain proper nutrition. Soups and broths with crackers or rice are fine. SEEK IMMEDIATE MEDICAL CARE IF:   You have severe headaches, shortness of breath, chest pain, neck pain, or an unusual rash.  You have uncontrolled vomiting, diarrhea, or you are unable to keep down fluids.  You or your child has an oral temperature above 102 F (38.9 C), not controlled  by medicine.  Your baby is older than 3 months with a rectal temperature of 102 F (38.9 C) or higher.  Your baby is 633 months old or younger with a rectal temperature of 100.4 F (38 C) or higher. MAKE SURE YOU:   Understand these instructions.  Will watch your condition.  Will get help right away if you are not doing well or get worse. Document Released: 07/21/2005 Document Revised: 01/03/2012 Document Reviewed: 02/15/2011 [facility] Patient Information 2015 [place]. This information is not intended to replace advice given to you by your health care provider. Make sure you discuss any questions you have with your health care provider.   Possible Influenza Influenza ("the flu") is a viral infection of the respiratory tract. It occurs more often in winter months because people spend more time in close contact with one another. Influenza can make you feel very sick. Influenza easily spreads from person to person (contagious). CAUSES  Influenza is caused by a virus that infects the respiratory tract. You can catch the virus by breathing in droplets from an infected person's cough or sneeze. You can also catch the virus by touching something that was recently contaminated with the virus and then touching your mouth, nose, or eyes. RISKS AND COMPLICATIONS You may be at risk for a more severe case of influenza if you smoke cigarettes, have diabetes, have chronic heart disease (such as heart failure) or lung disease (such as asthma), or if you have a weakened immune system. Elderly people and pregnant women are also at risk for more serious infections. The most common problem of influenza is a lung infection (pneumonia). Sometimes, this problem can require emergency medical care and  may be life threatening. SIGNS AND SYMPTOMS  Symptoms typically last 4 to 10 days and may include:  Fever.  Chills.  Headache, body aches, and muscle aches.  Sore throat.  Chest discomfort and  cough.  Poor appetite.  Weakness or feeling tired.  Dizziness.  Nausea or vomiting. DIAGNOSIS  Diagnosis of influenza is often made based on your history and a physical exam. A nose or throat swab test can be done to confirm the diagnosis. TREATMENT  In mild cases, influenza goes away on its own. Treatment is directed at relieving symptoms. For more severe cases, your health care provider may prescribe antiviral medicines to shorten the sickness. Antibiotic medicines are not effective because the infection is caused by a virus, not by bacteria. HOME CARE INSTRUCTIONS  Take medicines only as directed by your health care provider.  Use a cool mist humidifier to make breathing easier.  Get plenty of rest until your temperature returns to normal. This usually takes 3 to 4 days.  Drink enough fluid to keep your urine clear or pale yellow.  Cover yourmouth and nosewhen coughing or sneezing,and wash your handswellto prevent thevirusfrom spreading.  Stay homefromwork orschool untilthe fever is gonefor at least 601full day. PREVENTION  An annual influenza vaccination (flu shot) is the best way to avoid getting influenza. An annual flu shot is now routinely recommended for all adults in the [place] SEEK MEDICAL CARE IF:  You experiencechest pain, yourcough worsens,or you producemore mucus.  Youhave nausea,vomiting, ordiarrhea.  Your fever returns or gets worse. SEEK IMMEDIATE MEDICAL CARE IF:  You havetrouble breathing, you become short of breath,or your skin ornails becomebluish.  You have severe painor stiffnessin the neck.  You develop a sudden headache, or pain in the face or ear.  You have nausea or vomiting that you cannot control. MAKE SURE YOU:   Understand these instructions.  Will watch your condition.  Will get help right away if you are not doing well or get worse. Document Released: 10/08/2000 Document Revised: 02/25/2014 Document Reviewed:  01/10/2012 [facility] Patient Information 2015 [place], [place]. This information is not intended to replace advice given to you by your health care provider. Make sure you discuss any questions you have with your health care provider.

## 2014-09-19 NOTE — ED Notes (Signed)
EDP at bedside  

## 2014-09-19 NOTE — ED Notes (Signed)
Pt. reports persistent dry cough with chest congestion , mid back pain , and headache onset this week .

## 2014-09-19 NOTE — ED Notes (Signed)
Patient transported to X-ray 

## 2014-09-24 ENCOUNTER — Ambulatory Visit (INDEPENDENT_AMBULATORY_CARE_PROVIDER_SITE_OTHER): Payer: 59 | Admitting: Licensed Clinical Social Worker

## 2014-09-24 DIAGNOSIS — F330 Major depressive disorder, recurrent, mild: Secondary | ICD-10-CM

## 2014-09-24 NOTE — Progress Notes (Signed)
   THERAPIST PROGRESS NOTE  Session Time: 9:10AM-10:07AM  Participation Level: Active  Behavioral Response: Neat and Well GroomedAlertDepressed  Type of Therapy: Individual Therapy  Treatment Goals addressed: Communication: with her spouse and Coping  Interventions: CBT, Strength-based and Supportive  Summary: [name] is a 63 y.o. female who presents with symptoms of depression. Patient discussed her increase in symptoms as crying spells, isolating herself from others, feeling hopeless and helpless. Patient reports that she has attended church although she does not find joy in it anymore. Patient sites ongoing communication problems with her husband as a contributing factor to her depression. Patient reports that her depression has been increasing with the limited ability to use her coping skills. Patient was tearful for the session and reports that she is "tired of being miserable" and she feels that her depression will continue to worsen without the support of her husband. Patient discussed coping skills that she could use as reading books that she recently purchased, talking to church members, being with her great grandson, and and listening to music. Patient reports that she does not currently have a radio, but would like to request one for her birthday.  Patient reports that she is open to attending group and she feels that it will be helpful for her at this time. Patient denies SI/HI and psychosis and reviewed her safety plan. Patient reports that if her depressions worsens to the point that she has SI she feels comfortable using her safety plan and contacting mobile crisis or going to the nearest emergency room.  Patient reports that she has been reviewing her "I statements" and is prepared to have a joint sessions with her husband if he agreed.    Suicidal/Homicidal: Nowithout intent/plan  Therapist Response: LCSW assessed patient and her symptoms since her last visit. LCSW inquired  about possible contributing factors to her depression as well as coping skills that she can use.  LCSW discussed the patient attending IOP group and asked the patient if she would be interested. LCSW assessed the patients current safety and reviewed the safety plan. LCSW asked the patient's husband if he would be willing to attend a session once a two way communication release was signed.  LCSW will see family in a joint session and patient will begin the IOP group.      Plan: Return again in 1 weeks, take medications as prescribed, review orientation materials for IOP group, review "I statements" to discuss in family session, utilize mobile crisis or the emergency if patient feels that she may harm herself.    Diagnosis: Major depressive disorder, recurrent episode, mild    Genice RougeHerbin, Oniya Mandarino M, LCSW 09/24/2014

## 2014-09-25 ENCOUNTER — Ambulatory Visit (INDEPENDENT_AMBULATORY_CARE_PROVIDER_SITE_OTHER): Payer: PRIVATE HEALTH INSURANCE | Admitting: Internal Medicine

## 2014-09-25 ENCOUNTER — Encounter: Payer: Self-pay | Admitting: Internal Medicine

## 2014-09-25 VITALS — BP 148/68 | HR 87 | Temp 98.2°F | Wt 195.2 lb

## 2014-09-25 DIAGNOSIS — Z794 Long term (current) use of insulin: Principal | ICD-10-CM

## 2014-09-25 DIAGNOSIS — R232 Flushing: Secondary | ICD-10-CM

## 2014-09-25 DIAGNOSIS — N951 Menopausal and female climacteric states: Secondary | ICD-10-CM

## 2014-09-25 DIAGNOSIS — E1165 Type 2 diabetes mellitus with hyperglycemia: Secondary | ICD-10-CM

## 2014-09-25 DIAGNOSIS — I5032 Chronic diastolic (congestive) heart failure: Secondary | ICD-10-CM

## 2014-09-25 DIAGNOSIS — IMO0002 Reserved for concepts with insufficient information to code with codable children: Secondary | ICD-10-CM

## 2014-09-25 LAB — GLUCOSE, CAPILLARY: Glucose-Capillary: 171 mg/dL — ABNORMAL HIGH (ref 70–99)

## 2014-09-25 MED ORDER — FUROSEMIDE 40 MG PO TABS: 40.0000 mg | ORAL_TABLET | Freq: Every morning | ORAL | Status: DC

## 2014-09-25 MED ORDER — CLONIDINE HCL 0.3 MG/24HR TD PTWK: 0.3000 mg | MEDICATED_PATCH | TRANSDERMAL | Status: DC

## 2014-09-25 NOTE — Patient Instructions (Signed)
I have increased your clonidine patch to 0.3mg . If you noticed dizziness or feel that your blood pressures are dropping low call the clinic.  Continue take losartan 50mg  for your blood pressure.  We will follow up with you in 4 weeks for your diabetes.

## 2014-09-26 LAB — CULTURE, BLOOD (ROUTINE X 2)
Culture: NO GROWTH
Culture: NO GROWTH

## 2014-09-26 NOTE — Assessment & Plan Note (Signed)
Pt here for f/u on diabetes. Glucometer report ranges from 46-575. Pt has been working w/ Lupita Leashonna on carb counting. Per review of glucometer report she started having low CBGs after insulin adjustment was made on 11/11.   - will decrease insulin dose per carb amt during the lunch time and dinner time. Spoke w/ Lupita Leashonna who changed glucometer insulin/carb dose - will f/u with Lupita Leashonna in 2 week - [facility], can check A1c at that visit - instructed to stop eating sweets which pt states is her comfort food. Pt needs to stick to a better diabetic diet, likely reason why DM is uncontrolled.

## 2014-09-26 NOTE — Assessment & Plan Note (Signed)
Pt on clonidine 0.2mg  patch for hot flashes. States it is not working for her. BP today was 148/68  - increased clonidine patch to 0.3mg . BP high normal and likely BP will not be significantly affected by increase in clonidine patch. Instructed not to abruptly stop taking clonidine 2/2 rebound HTN. Also instructed to contact clinic if she notices dizziness/light headedness or other sx of low blood pressure.

## 2014-09-26 NOTE — Progress Notes (Signed)
   Subjective:    Patient ID: [name], female    DOB: [birth year], 63 y.o.   MRN: [id number]  HPI Pt is a 63 y/o F w/ PMHx of uncontrolled DM2, CAD, and HTN who presents to clinic for DM f/u. Pt has had recent night time eating w/ difficult to control blood sugars. She then spoke w/ Lupita Leashonna on 09/04/14 who increased amt of insulin given per carb amt to compensate for pt's night time eating habits. Pt states she is frustrated with diabetes but denies difficulties w/ carb counting. She occasionally will have to estimate carbs such as when her husband cooks mac and cheese since she cannot count carbs based on the box nutrition labels.   Review of pt's glucometer report reveal that after increased insulin adjustment on 11/11 pt started having low CBGs particularly after lunch time and around dinner. CBGs range from 46-575. Pt can tell when she gets hypoglycemic and knows to drink juice or eat candy.   Pt is also request refill on clonidine patch for hot flashes, states they are not working for her. Given refill for lasix 40mg  QD as well.    Review of Systems  HENT: Negative for trouble swallowing.   Endocrine: Positive for polydipsia. Negative for polyuria.  Neurological: Negative for weakness.       Objective:   Physical Exam  Constitutional: She appears well-developed and well-nourished.  HENT:  Head: Normocephalic.  Cardiovascular: Normal rate and regular rhythm.   Pulmonary/Chest: Effort normal and breath sounds normal.  Abdominal: Soft. Bowel sounds are normal.  Skin: Skin is warm and dry.          Assessment & Plan:  Please see problem based assessment and plan.

## 2014-09-27 NOTE — Progress Notes (Signed)
INTERNAL MEDICINE TEACHING ATTENDING ADDENDUM - Earl LagosNischal Tamaj Jurgens, MD: I personally saw and evaluated Ms. [name] in this clinic visit in conjunction with the resident, Dr. Danella Pentonruong. I have discussed patient's plan of care with medical resident during this visit. I have confirmed the physical exam findings and have read and agree with the clinic note including the plan with the following addition: - Pt with labile blood sugars. Will decrease pre lunch and dinner insulin for now - [facility]

## 2014-10-01 ENCOUNTER — Ambulatory Visit (INDEPENDENT_AMBULATORY_CARE_PROVIDER_SITE_OTHER): Payer: 59 | Admitting: Licensed Clinical Social Worker

## 2014-10-01 ENCOUNTER — Ambulatory Visit (HOSPITAL_BASED_OUTPATIENT_CLINIC_OR_DEPARTMENT_OTHER): Payer: PRIVATE HEALTH INSURANCE | Admitting: Physical Medicine & Rehabilitation

## 2014-10-01 ENCOUNTER — Encounter: Payer: Self-pay | Admitting: Physical Medicine & Rehabilitation

## 2014-10-01 ENCOUNTER — Encounter: Payer: PRIVATE HEALTH INSURANCE | Attending: Physical Medicine & Rehabilitation

## 2014-10-01 VITALS — BP 128/80 | HR 88 | Resp 14 | Ht 65.0 in | Wt 195.0 lb

## 2014-10-01 DIAGNOSIS — Z5181 Encounter for therapeutic drug level monitoring: Secondary | ICD-10-CM | POA: Insufficient documentation

## 2014-10-01 DIAGNOSIS — Z79899 Other long term (current) drug therapy: Secondary | ICD-10-CM | POA: Diagnosis not present

## 2014-10-01 DIAGNOSIS — M47817 Spondylosis without myelopathy or radiculopathy, lumbosacral region: Secondary | ICD-10-CM | POA: Diagnosis not present

## 2014-10-01 DIAGNOSIS — M47816 Spondylosis without myelopathy or radiculopathy, lumbar region: Secondary | ICD-10-CM

## 2014-10-01 DIAGNOSIS — G894 Chronic pain syndrome: Secondary | ICD-10-CM | POA: Diagnosis present

## 2014-10-01 DIAGNOSIS — F330 Major depressive disorder, recurrent, mild: Secondary | ICD-10-CM

## 2014-10-01 MED ORDER — DIAZEPAM 5 MG PO TABS: 5.0000 mg | ORAL_TABLET | Freq: Once | ORAL | Status: DC

## 2014-10-01 NOTE — Patient Instructions (Signed)
You had a radio frequency procedure today This was done to alleviate joint pain in your lumbar area We injected a combination of dexamethasone which is a steroid as well as lidocaine which is a local anesthetic. Dexamethasone made increased blood sugars you are diabetic You may experience soreness at the injection sites. You may also experienced some irritation of the nerves that were heated I'm recommending ice for 30 minutes every 2 hours as needed for the next 24-48 hours   

## 2014-10-01 NOTE — Progress Notes (Signed)
  PROCEDURE RECORD [facility]   Name: [name] DOB:[birth year] MRN: [id number]  Date:10/01/2014  Physician: Claudette LawsAndrew Kirsteins, MD    Nurse/CMA: Mary Beth Ziaire Bieser   Allergies:  Allergies  Allergen Reactions  . Cimetidine Other (See Comments)    Breast swelling  . Indomethacin Diarrhea    Caused severe diarrhea with episodes of incontinance  . Penicillins Hives and Itching    Other reaction(s): Delusions (intolerance)  . Indomethacin Other (See Comments)    Severe diarrhea  . Sulfonamide Derivatives     unknown    Consent Signed: Yes.    Is patient diabetic? Yes.    CBG today? 124  Pregnant: No. LMP: No LMP recorded. Patient has had a hysterectomy. (age [age])  Anticoagulants: no Anti-inflammatory: no Antibiotics: no  Procedure: Radiofrequency L3,4 &5 Position: Prone Start Time: 1:50pm  End Time: 2:09pm  Fluoro Time: 46  RN/CMA Sagan Wurzel   Alveena Taira    Time 1:11pm 2:15pm    BP 124/80 122/60    Pulse 88 76    Respirations 14 14    O2 Sat 98 99    S/S 6 6    Pain Level 8/10 5/10     D/C home with husband, patient A & O X 3, D/C instructions reviewed, and sits independently.  Diazepam 5 mg tablet  With one refillcalled to pharmacy to be taken prior to NEXT injection. SWS RN

## 2014-10-01 NOTE — Progress Notes (Signed)
   THERAPIST PROGRESS NOTE  Session Time: 2:55PM-3:55PM  Participation Level: Active  Behavioral Response: Neat and Well GroomedAlertAnxious and Irritable  Type of Therapy: Family Therapy  Treatment Goals addressed: Communication: with her husband and Diagnosis: Major depressive disorder, recurrent episode, mild  Interventions: Strength-based and Supportive  Summary: [name] is a 63 y.o. female who presents with symptoms of depression. Patient presented in this session to communicate with her husband in an assertive matter regarding her symptoms of depression and how she would like for him to support her in her recovery.  Patient was tearful for the majority of the session while speaking with her husband but she used her statements as role-played in previous sessions. Patient discussed her anger and how that has led to her lashing out at him and caused her to feel more depressed. Patient discussed how she would like for her husband to communicate with her regularly in a calm tone and show her more attention and affection. Patient discussed how she will not tolerate drinking in her home and how she would like for her husband to lead bible study in the home and teach her. Patient's husband was guarded and reports that he feels that this conversation could have been held at home. Patient's husband agreed to work on all of the things that the patient discussed during the session.  Patient stressed the importance of therapy and how she wants to learn tools to manage her emotions and cope with depression.Patient reports that she feels confident in her session and she is ready to attend group to learn skills to manage her day to day symptoms and discuss topics with others who may have similar experiences.  Patient denies SI/HI and psychosis.  .   Suicidal/Homicidal: Nowithout intent/plan  Therapist Response: LCSW met with the patient and her husband to discuss how depression has played a role in  their relationship and address things that she would like to see change for the future. LCSW introduced herself and set the ground rules for the session.  LCSW listened actively and provided support to the patient. LCSW clarified the patients requests upon her requests and redirected the patient's husband to follow the rules of the session and stay on topic as needed.  LCSW followed up the patient's request with asking how the patient's husband felt and asking if he would be willing to work with the patient on her journey to recovery. LCSW asked the patient's husband what he got out of the session and thanked him for attending. LCSW checked in with the patient alone and asked how she felt about the session and discussed her transition to IOP.  LCSW assessed the patients symptoms and her confidence in the family session.   Plan: Return again once IOP is complete, take medications as prescribed, used coping skills, keep scheduled appointments, attend IOP as scheduled, utilize mobile crisis in the case of a mental health emergency. .  Diagnosis: Major depressive disorder, recurrent episode, mild     Irean Hong, LCSW 10/01/2014

## 2014-10-01 NOTE — Progress Notes (Signed)
Left L5 dorsal ramus., left L4 and left L3 medial branch radio frequency neuropathy under fluoroscopic guidance  Indication: Low back pain due to lumbar spondylosis which has been relieved on 2 occasions by greater than 50% by lumbar medial branch blocks at corresponding levels.  Informed consent was obtained after describing risks and benefits of the procedure with the patient, this includes bleeding, bruising, infection, paralysis and medication side effects. The patient wishes to proceed and has given written consent. The patient was placed in a prone position. The lumbar and sacral area was marked and prepped with Betadine. A 25-gauge 1-1/2 inch needle was inserted into the skin and subcutaneous tissue at 3 sites in one ML of 1% lidocaine was injected into each site. Then a 20-gauge 10 cm radio frequency needle with a 1 cm curved active tip was inserted targeting the left S1 SAP/sacral ala junction. Bone contact was made and confirmed with lateral imaging. Sensory stimulation at 50 Hz followed by motor stimulation at 2 Hz confirm proper needle location followed by injection of one ML of the solution containing one ML of 4 mg per mL dexamethasone and 3 mL of 1% MPF lidocaine. Then the left L5 SAP/transverse process junction was targeted. Bone contact was made and confirmed with lateral imaging. Sensory stimulation at 50 Hz followed by motor stimulation at 2 Hz confirm proper needle location followed by injection of one ML of the solution containing one ML of 4 mg per mL dexamethasone and 3 mL of 1% MPF lidocaine. Then the left L4 SAP/transverse process junction was targeted. Bone contact was made and confirmed with lateral imaging. Sensory stimulation at 50 Hz followed by motor stimulation at 2 Hz confirm proper needle location followed by injection of one ML of the solution containing one ML of 4 mg per mL dexamethasone and 3 mL of 1% MPF lidocaine. Radio frequency lesion being at 80C for 90 seconds was  performed. Needles were removed. Post procedure instructions and vital signs were performed. Patient tolerated procedure well. Followup appointment was given.  

## 2014-10-03 ENCOUNTER — Encounter (HOSPITAL_COMMUNITY): Payer: Self-pay

## 2014-10-03 ENCOUNTER — Other Ambulatory Visit (HOSPITAL_COMMUNITY): Payer: 59 | Attending: Psychiatry | Admitting: Psychiatry

## 2014-10-03 DIAGNOSIS — I10 Essential (primary) hypertension: Secondary | ICD-10-CM | POA: Insufficient documentation

## 2014-10-03 DIAGNOSIS — K219 Gastro-esophageal reflux disease without esophagitis: Secondary | ICD-10-CM | POA: Diagnosis not present

## 2014-10-03 DIAGNOSIS — E785 Hyperlipidemia, unspecified: Secondary | ICD-10-CM | POA: Diagnosis not present

## 2014-10-03 DIAGNOSIS — E559 Vitamin D deficiency, unspecified: Secondary | ICD-10-CM | POA: Insufficient documentation

## 2014-10-03 DIAGNOSIS — F331 Major depressive disorder, recurrent, moderate: Secondary | ICD-10-CM

## 2014-10-03 DIAGNOSIS — F419 Anxiety disorder, unspecified: Secondary | ICD-10-CM | POA: Insufficient documentation

## 2014-10-03 DIAGNOSIS — G4730 Sleep apnea, unspecified: Secondary | ICD-10-CM | POA: Insufficient documentation

## 2014-10-03 DIAGNOSIS — Z794 Long term (current) use of insulin: Secondary | ICD-10-CM | POA: Diagnosis not present

## 2014-10-03 DIAGNOSIS — G629 Polyneuropathy, unspecified: Secondary | ICD-10-CM | POA: Diagnosis not present

## 2014-10-03 DIAGNOSIS — E1143 Type 2 diabetes mellitus with diabetic autonomic (poly)neuropathy: Secondary | ICD-10-CM | POA: Diagnosis not present

## 2014-10-03 DIAGNOSIS — F411 Generalized anxiety disorder: Secondary | ICD-10-CM

## 2014-10-03 DIAGNOSIS — K3184 Gastroparesis: Secondary | ICD-10-CM | POA: Insufficient documentation

## 2014-10-03 DIAGNOSIS — I2510 Atherosclerotic heart disease of native coronary artery without angina pectoris: Secondary | ICD-10-CM | POA: Diagnosis not present

## 2014-10-03 NOTE — Progress Notes (Signed)
Psychiatric Assessment Adult  Patient Identification:  [name] Date of Evaluation:  10/03/2014 Chief Complaint: increasing anxiety and depression related to health and marital stress History of Chief Complaint:   Chief Complaint  Patient presents with  . Depression  . Anxiety  . Stress    Anxiety     Review of Systems Physical Exam  Depressive Symptoms: depressed mood, fatigue, difficulty concentrating, anxiety, loss of energy/fatigue, disturbed sleep, decreased labido,  (Hypo) Manic Symptoms:   Elevated Mood:  Negative Irritable Mood:  Yes Grandiosity:  Negative Distractibility:  Negative Labiality of Mood:  Yes Delusions:  Negative Hallucinations:  Negative Impulsivity:  Negative Sexually Inappropriate Behavior:  Negative Financial Extravagance:  Negative Flight of Ideas:  Negative  Anxiety Symptoms: Excessive Worry:  Yes Panic Symptoms:  Yes Agoraphobia:  Negative Obsessive Compulsive: Negative  Symptoms: None, Specific Phobias:  Negative Social Anxiety:  Negative  Psychotic Symptoms:  Hallucinations: Negative None Delusions:  Negative Paranoia:  Negative   Ideas of Reference:  Negative  PTSD Symptoms: Ever had a traumatic exposure:  Negative Had a traumatic exposure in the last month:  Negative Re-experiencing: Negative None Hypervigilance:  Negative Hyperarousal: Negative None Avoidance: Negative None  Traumatic Brain Injury: Negative NA  Past Psychiatric History: Diagnosis: Major depression, recurrent, moderate and anxiety disorder, unspecified  Hospitalizations: many  Outpatient Care: sees therapist and psychiatrist  Substance Abuse Care: none  Self-Mutilation: none  Suicidal Attempts: none recently, attempt in the past years ago  Violent Behaviors: none   Past Medical History:   Past Medical History  Diagnosis Date  . Hypertension   . Peripheral neuropathy   . Cervicalgia   . Low back pain syndrome   . Hyperlipidemia   .  Vitamin D deficiency   . Vitamin B12 deficiency   . Iliotibial band syndrome   . Anemia   . Borderline personality disorder   . Nonorganic psychosis   . GERD (gastroesophageal reflux disease)   . Diabetes mellitus   . Depression   . Diabetic gastroparesis   . CAD (coronary artery disease)     Catherization 11/18/09>nonobstructive CAD , normal LV function, EF 65%  . Health maintenance examination     Colonoscopy 2009> normal; Diabetic eye exam 12/2008. No diabetic retinopathy; Mammogram 11/10> No evidence of malignancy, DXA 03/14/13 : normal  . Headache(784.0)   . Arthritis   . SBO (small bowel obstruction) 01/09/2013  . Diastolic CHF     Grade I, on Echo 06/2013, EF 55-60%  . Sleep apnea     does not wear CPAP   . Diabetes mellitus, type II   . Wears glasses   . Wears dentures     upper  . Gout   . Right carpal tunnel syndrome 09/13/2014   History of Loss of Consciousness:  Negative Seizure History:  Negative Cardiac History:  Negative Allergies:   Allergies  Allergen Reactions  . Cimetidine Other (See Comments)    Breast swelling  . Indomethacin Diarrhea    Caused severe diarrhea with episodes of incontinance  . Penicillins Hives and Itching    Other reaction(s): Delusions (intolerance)  . Indomethacin Other (See Comments)    Severe diarrhea  . Sulfonamide Derivatives     unknown   Current Medications:  Current Outpatient Prescriptions  Medication Sig Dispense Refill  . ACCU-CHEK FASTCLIX LANCETS MISC Use to Check Blood Sugars up to 4 Times a Day. ICD-10 Code: E11.65 102 each 6  . acetaminophen-codeine (TYLENOL #4) 300-60 MG per tablet take 1  tablet by mouth three times a day 90 tablet 1  . aspirin 81 MG chewable tablet Chew 81 mg by mouth daily.    Marland Kitchen. buPROPion (WELLBUTRIN XL) 150 MG 24 hr tablet Take 3 tablets (450 mg total) by mouth every morning. 90 tablet 5  . busPIRone (BUSPAR) 10 MG tablet Take 10-20 mg by mouth 2 (two) times daily. Take 15 mg every morning  and30 mg every evening    . cloNIDine (CATAPRES - DOSED IN MG/24 HR) 0.3 mg/24hr patch Place 1 patch (0.3 mg total) onto the skin every 7 (seven) days. 4 patch 11  . dexlansoprazole (DEXILANT) 60 MG capsule Take 60 mg by mouth daily.     . diazepam (VALIUM) 5 MG tablet Take 1 tablet (5 mg total) by mouth once. Take prior to injection 2 tablet 0  . Docusate Sodium (COLACE PO) Take 2 tablets by mouth daily as needed (for constipation).     . furosemide (LASIX) 40 MG tablet Take 1 tablet (40 mg total) by mouth every morning. 30 tablet 3  . gabapentin (NEURONTIN) 800 MG tablet Take 1 tablet (800 mg total) by mouth as directed. (Patient taking differently: Take 800 mg by mouth as directed. 800mg  am and 1600mg  evening) 90 tablet 11  . glucose blood (ACCU-CHEK AVIVA PLUS) test strip Check blood sugar up to 4 times a day as instructed 125 each 5  . insulin aspart (NOVOLOG FLEXPEN) 100 UNIT/ML FlexPen inject with meals and snacks up to 8 times a day as directed by your aviva glucometer. 30 mL 11  . Insulin Glargine (LANTUS SOLOSTAR) 100 UNIT/ML Solostar Pen Inject 35 Units into the skin daily at 10 pm. (Patient taking differently: Inject 47 Units into the skin daily at 10 pm. If below 250 -43u) 15 mL 11  . Insulin Pen Needle (BD PEN NEEDLE NANO U/F) 32G X 4 MM MISC USE TO TEST UP TO 5 TIMES A DAY ACCORDING TO CBG 100 each 4  . isosorbide mononitrate (IMDUR) 60 MG 24 hr tablet Take 1 tablet (60 mg total) by mouth every morning. 30 tablet 6  . losartan (COZAAR) 100 MG tablet Take 50 mg by mouth every morning.    . metoprolol succinate (TOPROL-XL) 25 MG 24 hr tablet Take 1 tablet (25 mg total) by mouth every morning. 30 tablet 6  . ondansetron (ZOFRAN) 4 MG tablet Take 4 mg by mouth every 8 (eight) hours as needed for nausea.     . rosuvastatin (CRESTOR) 10 MG tablet Take 1 tablet (10 mg total) by mouth at bedtime. 30 tablet 11  . senna (SENOKOT) 8.6 MG TABS Take 4 tablets by mouth daily as needed (for  constipation).     . sennosides-docusate sodium (SENOKOT-S) 8.6-50 MG tablet Take 2 tablets by mouth daily. 30 tablet 1  . tiZANidine (ZANAFLEX) 2 MG tablet Take 1 tablet (2 mg total) by mouth every 8 (eight) hours as needed for muscle spasms. 90 tablet 1   No current facility-administered medications for this visit.    Previous Psychotropic Medications:  Medication Dose   taking medications as listed just above  NA                     Substance Abuse History in the last 12 months: Substance Age of 1st Use Last Use Amount Specific Type  Medical Consequences of Substance Abuse: none  Legal Consequences of Substance Abuse: none  Family Consequences of Substance Abuse: none  Blackouts:  Negative DT's:  Negative Withdrawal Symptoms:  Negative None  Social History: Current Place of Residence: [place] Place of Birth: [place] Family Members: husband,  Marital Status:  Married Children: 2   Sons: 1  Daughters: 1 Relationships: husband and particularly one great grand son 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 Legal History: none Hobbies/Interests: church and family  Family History:   Family History  Problem Relation Age of Onset  . Diabetes Mother   . Hypertension Mother   . Hyperlipidemia Mother   . Arthritis Mother   . Depression Mother   . Heart disease Father   . Diabetes Sister   . Diabetes Brother   . Heart disease Brother   . Heart disease Paternal Grandmother   . Seizures Brother     Mental Status Examination/Evaluation: Objective:  Appearance: Well Groomed  Patent attorney::  Good  Speech:  Clear and Coherent  Volume:  Normal  Mood:  Depressed and anxious  Affect:  Appropriate  Thought Process:   Coherent and Logical  Orientation:  Full (Time, Place, and Person)  Thought Content:  Negative  Suicidal Thoughts:  No  Homicidal Thoughts:  No  Judgement:  Good  Insight:  Good  Psychomotor Activity:  Normal  Akathisia:  Negative  Handed:  Right  AIMS (if indicated):  0  Assets:  Communication Skills Desire for Improvement Financial Resources/Insurance Housing Intimacy Leisure Time Physical Health Resilience Social Support Talents/Skills Transportation Vocational/Educational    Laboratory/X-Ray Psychological Evaluation(s)   none  none   Assessment:  Major depression, recurrent, moderate and anxiety disorder, unspecified  AXIS I Major depression, recurrent, moderate and anxiety disorder, unspecified               Treatment Plan/Recommendations:  Plan of Care: group therapy daily  Laboratory:  none  Psychotherapy: group therapy daily  Medications:  Continue current medications  Routine PRN Medications:  Negative  Consultations: none  Safety Concerns:  Brittle diabetic  Other: none     Mailee Klaas D, MD 12/10/201510:40 AM

## 2014-10-03 NOTE — Progress Notes (Signed)
    Daily Group Progress Note  Program: IOP  Group Time: 9:00-10:30  Participation Level: Active  Behavioral Response: Appropriate  Type of Therapy:  Group Therapy  Summary of Progress: Pt. Met with case manager and psychiatrist.      Group Time: 10:30-12:00  Participation Level:  Active  Behavioral Response: Appropriate  Type of Therapy: Psycho-education Group  Summary of Progress: Pt. Participated in discussion about body image and self-acceptance.   Nancie Neas, LPC
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
## 2014-10-04 ENCOUNTER — Other Ambulatory Visit (HOSPITAL_COMMUNITY): Payer: 59 | Admitting: Psychiatry

## 2014-10-04 DIAGNOSIS — F331 Major depressive disorder, recurrent, moderate: Secondary | ICD-10-CM

## 2014-10-04 NOTE — Progress Notes (Signed)
    Daily Group Progress Note  Program: IOP  Group Time: 9:00-10:30  Participation Level: Active  Behavioral Response: Appropriate  Type of Therapy:  Group Therapy  Summary of Progress: Pt. Reported that she was doing "good". Pt. Reported that she continues to experience pain because of arthritis. Pt. Discussed decision to stop drinking several years ago and distance from family and husband's family because she does not drink anymore. Pt. Discussed plans to spend time with her mother and grandson over the weekend.      Group Time: 10:30-12:00  Participation Level:  Active  Behavioral Response: Appropriate  Type of Therapy: Psycho-education Group  Summary of Progress: Pt. Participated in discussion about developing motivation. Pt. Watched and discussed Mel Robbins video.   Shaune PollackBrown, Jennifer B, LPC

## 2014-10-07 ENCOUNTER — Other Ambulatory Visit (HOSPITAL_COMMUNITY): Payer: 59 | Admitting: Psychiatry

## 2014-10-07 DIAGNOSIS — F331 Major depressive disorder, recurrent, moderate: Secondary | ICD-10-CM

## 2014-10-07 NOTE — Progress Notes (Signed)
    Daily Group Progress Note  Program: IOP  Group Time: 9:00-10:30  Participation Level: Active  Behavioral Response: Appropriate  Type of Therapy:  Group Therapy  Summary of Progress: Pt. Reported that she had an ok weekend. Pt. Reported that she is having difficulty regulating her blood sugar which is frustrating. Pt. Reported that she has an appointment with her diabetes educator to assist with her meals and blood sugar monitoring.      Group Time: 10:30-12:00  Participation Level:  Active  Behavioral Response: Appropriate  Type of Therapy: Psycho-education Group  Summary of Progress: Pt. Participated in discussion about developing healthy relationship boundaries.   Shaune PollackBrown, Jennifer B, LPC

## 2014-10-08 ENCOUNTER — Other Ambulatory Visit (HOSPITAL_COMMUNITY): Payer: 59 | Admitting: Psychiatry

## 2014-10-08 ENCOUNTER — Telehealth (HOSPITAL_COMMUNITY): Payer: Self-pay | Admitting: Psychiatry

## 2014-10-08 ENCOUNTER — Encounter: Payer: Self-pay | Admitting: Dietician

## 2014-10-09 ENCOUNTER — Other Ambulatory Visit (HOSPITAL_COMMUNITY): Payer: 59

## 2014-10-10 ENCOUNTER — Other Ambulatory Visit (HOSPITAL_COMMUNITY): Payer: 59

## 2014-10-11 ENCOUNTER — Other Ambulatory Visit (HOSPITAL_COMMUNITY): Payer: 59

## 2014-10-14 ENCOUNTER — Other Ambulatory Visit (HOSPITAL_COMMUNITY): Payer: 59 | Admitting: Psychiatry

## 2014-10-14 DIAGNOSIS — F331 Major depressive disorder, recurrent, moderate: Secondary | ICD-10-CM

## 2014-10-15 ENCOUNTER — Other Ambulatory Visit: Payer: Self-pay | Admitting: Dietician

## 2014-10-15 ENCOUNTER — Other Ambulatory Visit (HOSPITAL_COMMUNITY): Payer: 59 | Admitting: Psychiatry

## 2014-10-15 DIAGNOSIS — Z794 Long term (current) use of insulin: Principal | ICD-10-CM

## 2014-10-15 DIAGNOSIS — F331 Major depressive disorder, recurrent, moderate: Secondary | ICD-10-CM | POA: Diagnosis not present

## 2014-10-15 DIAGNOSIS — IMO0002 Reserved for concepts with insufficient information to code with codable children: Secondary | ICD-10-CM

## 2014-10-15 DIAGNOSIS — E1165 Type 2 diabetes mellitus with hyperglycemia: Secondary | ICD-10-CM

## 2014-10-15 MED ORDER — ACCU-CHEK FASTCLIX LANCET KIT: PACK | Status: DC

## 2014-10-15 NOTE — Telephone Encounter (Signed)
Dropped her lancing device in the toilet. Needs a new one because hers will not work anymore. Also says she continues to have low blood sugar when she eats less after breakfast. Instructed her to lower her breakfast insulin dose by 2-4 units. She scheduled an appointment with CDE for Wednesday 10/23/14.  Reports she is now checking blood sugar as many as 5 times a day

## 2014-10-16 ENCOUNTER — Other Ambulatory Visit (HOSPITAL_COMMUNITY): Payer: 59 | Admitting: Psychiatry

## 2014-10-16 DIAGNOSIS — F331 Major depressive disorder, recurrent, moderate: Secondary | ICD-10-CM | POA: Diagnosis not present

## 2014-10-16 NOTE — Progress Notes (Signed)
    Daily Group Progress Note  Program: IOP  Group Time: 9:00-10:30  Participation Level: Active  Behavioral Response: Appropriate  Type of Therapy:  Group Therapy  Summary of Progress: Pt. Talked and smiled appropriately. Pt. Reported about her trip to [place]. Pt. Reported that she had body pain and felt low energy and depressed during most of the trip.      Group Time: 10:30-12:00  Participation Level:  Active  Behavioral Response: Appropriate  Type of Therapy: Psycho-education Group  Summary of Progress: Pt. Participated in group about countering cognitive distortions.   Shaune PollackBrown, Jennifer B, LPC

## 2014-10-17 ENCOUNTER — Other Ambulatory Visit (HOSPITAL_COMMUNITY): Payer: 59 | Admitting: Psychiatry

## 2014-10-17 DIAGNOSIS — F331 Major depressive disorder, recurrent, moderate: Secondary | ICD-10-CM

## 2014-10-17 NOTE — Progress Notes (Signed)
    Daily Group Progress Note  Program: IOP  Group Time: 9:00-10:30  Participation Level: Active  Behavioral Response: Appropriate  Type of Therapy:  Group Therapy  Summary of Progress: Pt. Presented with bright mood, talked and laughed appropriately. Pt. Discussed challenge of managing her diabetes and onset of most recent depressive episode with physical health conditions. Pt. Also challenged by husband who is not supportive of her.      Group Time: 10:30-12:00  Participation Level:  Active  Behavioral Response: Appropriate  Type of Therapy: Psycho-education Group  Summary of Progress: Pt. Participated in discussion about developing self-care routine and planning of self-care holiday party for tomorrow's group.   Shaune PollackBrown, Jennifer B, LPC

## 2014-10-17 NOTE — Progress Notes (Signed)
    Daily Group Progress Note  Program: IOP  Group Time: 9:00-10:30  Participation Level: Active  Behavioral Response: Appropriate  Type of Therapy:  Group Therapy  Summary of Progress: Pt. Presented with brightened mood, talkative, and receptive to feedback. Pt. Reported plans to spend time by herself over the Christmas holiday. Pt. Plans to come tomorrow for an individual session.      Group Time: 10:30-12:00  Participation Level:  Active  Behavioral Response: Appropriate  Type of Therapy: Psycho-education Group  Summary of Progress: Pt. Participated in self-care holiday party (i.e., aromatherapy, guided imagery, and benefits of chocolate).  Shaune PollackBrown, Jennifer B, LPC
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
## 2014-10-21 ENCOUNTER — Other Ambulatory Visit (HOSPITAL_COMMUNITY): Payer: 59

## 2014-10-22 ENCOUNTER — Other Ambulatory Visit (HOSPITAL_COMMUNITY): Payer: 59 | Admitting: Psychiatry

## 2014-10-22 DIAGNOSIS — F331 Major depressive disorder, recurrent, moderate: Secondary | ICD-10-CM | POA: Diagnosis not present

## 2014-10-22 NOTE — Progress Notes (Signed)
    Daily Group Progress Note  Program: IOP  Group Time: 9:00-10:30  Participation Level: Active  Behavioral Response: Appropriate  Type of Therapy:  Group Therapy  Summary of Progress: Pt. Presented with brightened affect. Pt. Reported that she believes new medication is working, but hoping to have adjusted. Pt. Reports that she continues to take the prn for anxiety in the morning and at bedtime. Pt. Continuing to work on setting relationship boundaries and assertive communication with spouse.      Group Time: 10:30-12:00  Participation Level:  Active  Behavioral Response: Appropriate  Type of Therapy: Psycho-education Group  Summary of Progress: Pt. Working on developing vulnerability to assist with assertive communication. Pt. Watched and discussed Dewain PenningBrene Avery Klingbeil video on vulnerability and shame resilience.   Shaune PollackBrown, Dondrell Loudermilk B, LPC

## 2014-10-23 ENCOUNTER — Encounter: Payer: Self-pay | Admitting: Internal Medicine

## 2014-10-23 ENCOUNTER — Encounter: Payer: Self-pay | Admitting: Dietician

## 2014-10-23 ENCOUNTER — Other Ambulatory Visit: Payer: Self-pay | Admitting: Internal Medicine

## 2014-10-23 ENCOUNTER — Ambulatory Visit (INDEPENDENT_AMBULATORY_CARE_PROVIDER_SITE_OTHER): Payer: PRIVATE HEALTH INSURANCE | Admitting: Internal Medicine

## 2014-10-23 ENCOUNTER — Ambulatory Visit (INDEPENDENT_AMBULATORY_CARE_PROVIDER_SITE_OTHER): Payer: PRIVATE HEALTH INSURANCE | Admitting: Dietician

## 2014-10-23 ENCOUNTER — Other Ambulatory Visit (HOSPITAL_COMMUNITY): Payer: 59 | Admitting: Psychiatry

## 2014-10-23 VITALS — BP 133/64 | HR 73 | Ht 65.0 in | Wt 197.7 lb

## 2014-10-23 DIAGNOSIS — E1165 Type 2 diabetes mellitus with hyperglycemia: Secondary | ICD-10-CM

## 2014-10-23 DIAGNOSIS — IMO0002 Reserved for concepts with insufficient information to code with codable children: Secondary | ICD-10-CM

## 2014-10-23 DIAGNOSIS — Z794 Long term (current) use of insulin: Principal | ICD-10-CM

## 2014-10-23 DIAGNOSIS — F331 Major depressive disorder, recurrent, moderate: Secondary | ICD-10-CM | POA: Diagnosis not present

## 2014-10-23 LAB — GLUCOSE, CAPILLARY
GLUCOSE-CAPILLARY: 572 mg/dL — AB (ref 70–99)
Glucose-Capillary: 571 mg/dL (ref 70–99)

## 2014-10-23 LAB — POCT GLYCOSYLATED HEMOGLOBIN (HGB A1C): HEMOGLOBIN A1C: 9.3

## 2014-10-23 MED ORDER — INSULIN GLARGINE 100 UNIT/ML SOLOSTAR PEN: 51.0000 [IU] | PEN_INJECTOR | Freq: Every day | SUBCUTANEOUS | Status: DC

## 2014-10-23 MED ORDER — INSULIN ASPART 100 UNIT/ML ~~LOC~~ SOLN
20.0000 [IU] | Freq: Once | SUBCUTANEOUS | Status: AC
  Administered 2014-10-23: 20 [IU] via SUBCUTANEOUS

## 2014-10-23 NOTE — Patient Instructions (Signed)
General Instructions: -Start using Lantus 51 units at bedtime.  -Eat a larger snack on Sundays to prevent low blood sugars in the afternoon.  -Continuing avoiding candy as you are.  -Follow up in 1 month with Dr. Danella Pentonruong-- we will work on the forms for the diabetic shoes, you should be contacted once the order is ready.    Happy New Year!  Thank you for bringing your medicines today. This helps us keep you safe from mistakes.   Progress Toward Treatment Goals:  Treatment Goal 08/14/2014  Hemoglobin A1C -  Blood pressure deteriorated    Self Care Goals & Plans:  Self Care Goal 10/23/2014  Manage my medications take my medicines as prescribed; bring my medications to every visit; refill my medications on time  Monitor my health keep track of my blood glucose; bring my glucose meter and log to each visit  Eat healthy foods drink diet soda or water instead of juice or soda; eat more vegetables; eat foods that are low in salt; eat baked foods instead of fried foods; eat fruit for snacks and desserts  Be physically active -  Meeting treatment goals -    Home Blood Glucose Monitoring 01/02/2014  Check my blood sugar 3 times a day  When to check my blood sugar before breakfast; at bedtime; before lunch     Care Management & Community Referrals:  Referral 11/15/2013  Referrals made for care management support diabetes educator  Referrals made to community resources weight management; exercise/physical therapy; nutrition

## 2014-10-23 NOTE — Progress Notes (Signed)
Medical Nutrition Therapy:  Appt start time: 1435 end time:  1500  Assessment:  Primary concerns today: Blood sugar control.  Patient is concerned with low blood sugars midday. She has been having a difficult time emotionally and says she feels burned out on using meter and carb counting.Reports symptoms of  Hypoglycemia when CBgs are < 100 mg/dl.  Her goal is to [facility] Meter download for past 30 days: average of  101 readings is 124, range 56-255, standard deviation ~47, no data from 12/24 through 12/29 when meter read HI A1C today 9.3%    INSULIN:  Current basal is Lantus 51 units/day, current bolus average is  ~ 48 units for a TDD of 99 units, new CIR: 1 unit to 6 from 1200 am to 1350 PM, 1:5 1351 to 2400, new Correction factor: 1 unit lowers CBG ~ 18 mg/dl    Progress Towards Goal(s):  No progress.   Nutritional Diagnosis:  Buchtel-2.3 Food-medication interaction As related to lack of coordination between food intake and insulin dosages is less improved due to multiple factors As evidenced by her deterioration in  blood sugars,( but seems  less frustrated with being restricted on what and when she eats to have to control her blood sugars).    Intervention:  There is descrepency between her A1C and her last months's CBg record. Unsure if this is slanted by the last 5 days of very high readings. Consider weekly break from recording food intake and using meter, but not taking basal insulin except when having symptoms. Consider less intensive diabetes regimen Teaching Method Utilized: Visual, Auditory  Barriers to learning/adherence to lifestyle change: Mood/depression/diabetes distress  Demonstrated degree of understanding via:  Teach Back   Monitoring/Evaluation:  Dietary intake, exercise, blood sugars, and body weight in 6 week(s).

## 2014-10-23 NOTE — Progress Notes (Signed)
    Daily Group Progress Note  Program: IOP  Group Time: 9:00-10:30  Participation Level: Active  Behavioral Response: Appropriate  Type of Therapy:  Group Therapy  Summary of Progress: Pt. Presented with brightened mood, talked and laughed appropriately. Pt. Reports dissatisfaction in relationship with spouse and is learning to be assertive in advocating for herself in her marriage. Pt. Finds comfort in caregiving for great grandson, volunteer work in [employer] and preparing meals for herself and her great-grandson. Pt. Has set goals of returning to the silver sneakers program and eating healthier so that she can better manage her diabetes.      Group Time: 10:30-12:00  Participation Level:  Active  Behavioral Response: Appropriate  Type of Therapy: Psycho-education Group  Summary of Progress: Pt. Participated in discussion of self-care including importance of healthy diet and exercise.   Shaune PollackBrown, Jennifer B, LPC

## 2014-10-24 ENCOUNTER — Other Ambulatory Visit (HOSPITAL_COMMUNITY): Payer: 59 | Admitting: Psychiatry

## 2014-10-24 ENCOUNTER — Encounter (HOSPITAL_COMMUNITY): Payer: Self-pay | Admitting: Psychiatry

## 2014-10-24 DIAGNOSIS — F331 Major depressive disorder, recurrent, moderate: Secondary | ICD-10-CM

## 2014-10-24 DIAGNOSIS — F411 Generalized anxiety disorder: Secondary | ICD-10-CM

## 2014-10-24 MED ORDER — LORAZEPAM 0.5 MG PO TABS: 0.5000 mg | ORAL_TABLET | Freq: Three times a day (TID) | ORAL | Status: DC

## 2014-10-24 NOTE — Assessment & Plan Note (Signed)
Lab Results  Component Value Date   HGBA1C 9.3 10/23/2014   HGBA1C 9.5 08/14/2014   HGBA1C 9.2 04/10/2014     Assessment: Diabetes control:  not controlled Progress toward A1C goal:   not at goal Comments: She needs further education for diabetes care. She has not used insulin in 5 days because she ran out of lancets with CBG of 571. She was given Novolog 20 units subC with blood sugar of 572 1 hr later. She met with Debera Lat and was given lancets. She affirmed that she had all her supplies to resume her insulin regimen at home.  In reviewing her CBG meter log for the past 3 weeks, she continues to have high blood sugar of >200 before breakfast. She has had hypoglycemia to 50s on Sunday afternoons--she explains that she goes to church on Sundays and has only a small "snack" with a late lunch.   Plan: Medications:  continue current medications and increase Lantus to 51 units qHS. She was encouraged to eat a larger snack on Sundays to prevent low blood sugar.  Home glucose monitoring: Frequency:   Timing:   Instruction/counseling given: reminded to bring blood glucose meter & log to each visit, reminded to bring medications to each visit and discussed diet Educational resources provided: brochure (has information) Self management tools provided: copy of home glucose meter download, home glucose logbook Other plans: Follow up in 2-3 weeks.

## 2014-10-24 NOTE — Progress Notes (Signed)
   Subjective:    Patient ID: [name], female    DOB: April 11, 1951, 63 y.o.   MRN: [id number]  HPI [name] is a 63 yr old woman with PMH significant for DM2 who presents for follow up diabetes care. She states that because she ran out of lancets she has not used insulin in 5 days. Her CBG during this visit is 571 (she had lunch immediately before this visit).  She also requests a prescription for diabetic shoes.    Review of Systems  Constitutional: Negative for appetite change.  Eyes: Negative for visual disturbance.  Respiratory: Negative for cough and shortness of breath.   Cardiovascular: Negative for chest pain and palpitations.  Gastrointestinal: Negative for abdominal pain.  Endocrine: Negative for polydipsia and polyuria.  Genitourinary: Negative for dysuria.  Skin: Negative for rash.  Neurological: Negative for dizziness, weakness and light-headedness.  Psychiatric/Behavioral: Negative for confusion and agitation.       Objective:   Physical Exam  Constitutional: She is oriented to person, place, and time. She appears well-developed and well-nourished. No distress.  Cardiovascular: Normal rate.   Pulmonary/Chest: Effort normal. No respiratory distress.  Musculoskeletal: She exhibits no edema.  Neurological: She is alert and oriented to person, place, and time.  Skin: Skin is warm and dry. She is not diaphoretic.  Psychiatric: She has a normal mood and affect.  Nursing note and vitals reviewed.         Assessment & Plan:

## 2014-10-24 NOTE — Progress Notes (Signed)
[name] is a 63 y.o. , married, African American, disabled, female who was referred per therapist (Jovea Herbin, LCSW); treaDyke Maestment for depressive and anxiety symptoms. Symptoms included: Sadness, low self esteem, indecisiveness, irritability, anhedonia, appetite and sleep changes, crying spells, isolating herself from others, feeling hopelessness and helplessness. Denied SI/HI or A/V hallucinations. Reported worsening symptoms for a couple of years. Stressors/Triggers Included: 1) Medical Issues: In 2013 pt started having chest pains and stomach issues. Pt had bowel obstruction surgery in 2013; then in 2015 had hernia repair surgery. In November 2015, pt had carpel tunnel surgery. Pt has been struggling with her diabetes; type 2. 2) Marriage: Pt has been married to her fourth husband for eighteen years. He is an Radiographer, therapeuticalcoholic minister. Hx of DUI's. Pt stated they have previously separated. According to pt, she set boundaries in a marital session a couple of weeks ago. "I told him that I wouldn't be staying in the marriage if he continues to drink." Patient sites ongoing communication problems with her husband as a contributing factor to her depression. Pt stated she feels that her depression will continue to worsen without the support of her husband. 3) No support system. [facility] and [facility]. Admitted to attempting suicide ten times by way of OD. Has seen Jovea Herbin, LCSW a few times and Dr. Donell BeersPlovsky since June 2014.  Pt completed MH-IOP today.  Reports overall mood is improving.  States she has upcoming goals to obtain (ie. Attend a support group, exercising, etc).  Pt states she is worried about her finances.  Denies SI/HI or A/V hallucinations.  A:  D/C today.  Will f/u with Jovea Herbin, LCSW on 11-08-14 @ 3pm and Dr. Donell BeersPlovsky on 11-13-14 @ 4pm.  Encouraged support groups.  Offered a referral to [facility] [employer] for assistance  with budgeting.  Pt declined; stating she knows how to budget already.  R:  Pt receptive.

## 2014-10-24 NOTE — Progress Notes (Signed)
    Daily Group Progress Note  Program: IOP  Group Time: 9:00-10:30  Participation Level: Active  Behavioral Response: Sharing  Type of Therapy:  Group Therapy  Summary of Progress: Pt. Prepared for discharge. Pt. Reported stress regarding financial problems. Pt. Reported commitment to setting new boundaries with husband, care-giving for great grandchildren, church relationships, and initiating social interaction with friends,and returning to the gym to exercise self-care.      Group Time: 10:30-12:00  Participation Level:  Active  Behavioral Response: Appropriate  Type of Therapy: Psycho-education Group  Summary of Progress: Pt. Participated in discussion about developing self-compassion. Pt. Watched and discussed Clovia CuffKristin Neff video.   Shaune PollackBrown, Jennifer B, LPC

## 2014-10-24 NOTE — Progress Notes (Signed)
Medicine attending: Medical history, presenting problems, physical findings, and medications, reviewed with Dr Kennerly on the day of the patient encounter and I concur with her evaluation and management plan. 

## 2014-10-24 NOTE — Progress Notes (Signed)
  [facility] Discharge Summary  [name] [id number]  Admission date: 10/03/2014 Discharge date: 10/24/2014  Reason for admission: depression and anxiety  Chemical Use History: none  Family of Origin Issues: none  Progress in Program Toward Treatment Goals: less depressed,has goals to exercise more, more interested in dressing up and getting out, interested in controlling her diet better, finds more happiness.  Anxiety persists and financial issues predominate there.    Progress (rationale): routine helped, sharing and being supported helped, learning coping skills helped    Benjaman PottAYLOR,Rufus Cypert D, MD 10/24/2014

## 2014-10-24 NOTE — Patient Instructions (Signed)
Patient completed MH-IOP today.  Will follow up with Dr. Donell BeersPlovsky on 11-13-14 @ 4pm and Jovea Herbin, LCSW (to be scheduled).  Encouraged support groups.

## 2014-10-28 ENCOUNTER — Other Ambulatory Visit (HOSPITAL_COMMUNITY): Payer: 59

## 2014-10-29 ENCOUNTER — Other Ambulatory Visit (HOSPITAL_COMMUNITY): Payer: 59

## 2014-10-30 ENCOUNTER — Other Ambulatory Visit (HOSPITAL_COMMUNITY): Payer: 59

## 2014-10-31 ENCOUNTER — Encounter: Payer: Self-pay | Admitting: Physical Medicine & Rehabilitation

## 2014-10-31 ENCOUNTER — Ambulatory Visit (HOSPITAL_BASED_OUTPATIENT_CLINIC_OR_DEPARTMENT_OTHER): Payer: Medicare Other | Admitting: Physical Medicine & Rehabilitation

## 2014-10-31 ENCOUNTER — Encounter: Payer: Medicare Other | Attending: Physical Medicine & Rehabilitation

## 2014-10-31 VITALS — BP 130/68 | HR 84 | Resp 14

## 2014-10-31 DIAGNOSIS — Z79899 Other long term (current) drug therapy: Secondary | ICD-10-CM | POA: Diagnosis not present

## 2014-10-31 DIAGNOSIS — M47817 Spondylosis without myelopathy or radiculopathy, lumbosacral region: Secondary | ICD-10-CM | POA: Insufficient documentation

## 2014-10-31 DIAGNOSIS — G894 Chronic pain syndrome: Secondary | ICD-10-CM | POA: Diagnosis not present

## 2014-10-31 DIAGNOSIS — Z5181 Encounter for therapeutic drug level monitoring: Secondary | ICD-10-CM | POA: Diagnosis not present

## 2014-10-31 DIAGNOSIS — M533 Sacrococcygeal disorders, not elsewhere classified: Secondary | ICD-10-CM

## 2014-10-31 NOTE — Progress Notes (Signed)
  PROCEDURE RECORD [facility]   Name: [name] DOB:Jun 27, 1951 MRN: [id number]  Date:10/31/2014  Physician: Claudette LawsAndrew Kirsteins, MD    Nurse/CMA: Kaedan Richert   Allergies:  Allergies  Allergen Reactions  . Cimetidine Other (See Comments)    Breast swelling  . Indomethacin Diarrhea    Caused severe diarrhea with episodes of incontinance  . Penicillins Hives and Itching    Other reaction(s): Delusions (intolerance)  . Indomethacin Other (See Comments)    Severe diarrhea  . Sulfonamide Derivatives     unknown    Consent Signed: Yes.    Is patient diabetic? Yes.    CBG today? 285  Pregnant: No. LMP: No LMP recorded. Patient has had a hysterectomy. (age [age])  Anticoagulants: no Anti-inflammatory: no Antibiotics: no  Procedure: Sacroiliac ESI Position: Prone Start Time: 11:33am End Time:11:42 Fluoro Time: 18  RN/CMA Russie Gulledge Terran Klinke    Time 11:13am 11:48am    BP 130/64 136/60    Pulse 84 86    Respirations 14 14    O2 Sat 98 98    S/S 6/6 6/6    Pain Level 9/10  7/10     D/C home with Husband, patient A & O X 3, D/C instructions reviewed, and sits independently.

## 2014-10-31 NOTE — Progress Notes (Signed)
Left sacroiliac injection under fluoroscopic guidance  Indication: Left Low back and buttocks pain not relieved by medication management and other conservative care.  Informed consent was obtained after describing risks and benefits of the procedure with the patient, this includes bleeding, bruising, infection, paralysis and medication side effects. The patient wishes to proceed and has given written consent. The patient was placed in a prone position. The lumbar and sacral area was marked and prepped with Betadine. A 25-gauge 1-1/2 inch needle was inserted into the skin and subcutaneous tissue and 1 mL of 1% lidocaine was injected. Then a 25-gauge 3 inch spinal needle was inserted under fluoroscopic guidance into the left sacroiliac joint. AP and lateral images were utilized. Omnipaque 180x0.5 mL under live fluoroscopy demonstrated no intravascular uptake. Then a solution containing one ML of 6 mg per mL betamethasone and 2 ML of 1% lidocaine MPF was injected x1.5 mL. Patient tolerated the procedure well. Post procedure instructions were given. Please see post procedure form. 

## 2014-11-01 ENCOUNTER — Other Ambulatory Visit (HOSPITAL_COMMUNITY): Payer: 59

## 2014-11-04 ENCOUNTER — Other Ambulatory Visit (HOSPITAL_COMMUNITY): Payer: 59

## 2014-11-04 ENCOUNTER — Encounter (HOSPITAL_COMMUNITY): Payer: Self-pay | Admitting: Licensed Clinical Social Worker

## 2014-11-05 ENCOUNTER — Other Ambulatory Visit (HOSPITAL_COMMUNITY): Payer: 59

## 2014-11-06 ENCOUNTER — Other Ambulatory Visit (HOSPITAL_COMMUNITY): Payer: 59

## 2014-11-07 ENCOUNTER — Encounter: Payer: Self-pay | Admitting: Internal Medicine

## 2014-11-07 ENCOUNTER — Other Ambulatory Visit (HOSPITAL_COMMUNITY): Payer: 59

## 2014-11-07 ENCOUNTER — Ambulatory Visit (INDEPENDENT_AMBULATORY_CARE_PROVIDER_SITE_OTHER): Payer: Medicare Other | Admitting: Internal Medicine

## 2014-11-07 VITALS — BP 132/57 | HR 88 | Temp 98.2°F | Ht 65.0 in | Wt 204.6 lb

## 2014-11-07 DIAGNOSIS — F329 Major depressive disorder, single episode, unspecified: Secondary | ICD-10-CM

## 2014-11-07 DIAGNOSIS — IMO0002 Reserved for concepts with insufficient information to code with codable children: Secondary | ICD-10-CM

## 2014-11-07 DIAGNOSIS — Z794 Long term (current) use of insulin: Secondary | ICD-10-CM

## 2014-11-07 DIAGNOSIS — M545 Low back pain: Secondary | ICD-10-CM

## 2014-11-07 DIAGNOSIS — G8929 Other chronic pain: Secondary | ICD-10-CM

## 2014-11-07 DIAGNOSIS — E1165 Type 2 diabetes mellitus with hyperglycemia: Secondary | ICD-10-CM

## 2014-11-07 DIAGNOSIS — I10 Essential (primary) hypertension: Secondary | ICD-10-CM

## 2014-11-07 DIAGNOSIS — F32A Depression, unspecified: Secondary | ICD-10-CM

## 2014-11-07 DIAGNOSIS — M549 Dorsalgia, unspecified: Principal | ICD-10-CM

## 2014-11-07 LAB — GLUCOSE, CAPILLARY: GLUCOSE-CAPILLARY: 155 mg/dL — AB (ref 70–99)

## 2014-11-07 LAB — PRESCRIPTION MONITORING PROFILE (SOLSTAS)
Amphetamine/Meth: NEGATIVE ng/mL
BUPRENORPHINE, URINE: NEGATIVE ng/mL
Barbiturate Screen, Urine: NEGATIVE ng/mL
Benzodiazepine Screen, Urine: NEGATIVE ng/mL
Cannabinoid Scrn, Ur: NEGATIVE ng/mL
Carisoprodol, Urine: NEGATIVE ng/mL
Cocaine Metabolites: NEGATIVE ng/mL
Creatinine, Urine: 47.76 mg/dL (ref >20.0)
ECSTASY: NEGATIVE ng/mL
FENTANYL URINE: NEGATIVE ng/mL
Meperidine, Ur: NEGATIVE ng/mL
Methadone Screen, Urine: NEGATIVE ng/mL
Nitrites, Initial: NEGATIVE ug/mL
OXYCODONE SCRN UR: NEGATIVE ng/mL
PH URINE, INITIAL: 4.9 pH (ref 4.5–8.9)
Propoxyphene: NEGATIVE ng/mL
TAPENTADOLUR: NEGATIVE ng/mL
TRAMADOL UR: NEGATIVE ng/mL
Zolpidem, Urine: NEGATIVE ng/mL

## 2014-11-07 LAB — BASIC METABOLIC PANEL WITH GFR
BUN: 34 mg/dL — AB (ref 6–23)
CHLORIDE: 104 meq/L (ref 96–112)
CO2: 27 meq/L (ref 19–32)
CREATININE: 1.78 mg/dL — AB (ref 0.50–1.10)
Calcium: 9.1 mg/dL (ref 8.4–10.5)
GFR, EST NON AFRICAN AMERICAN: 30 mL/min — AB
GFR, Est African American: 34 mL/min — ABNORMAL LOW
Glucose, Bld: 126 mg/dL — ABNORMAL HIGH (ref 70–99)
POTASSIUM: 4.4 meq/L (ref 3.5–5.3)
SODIUM: 140 meq/L (ref 135–145)

## 2014-11-07 NOTE — Progress Notes (Signed)
Internal Medicine Clinic Attending  Case discussed with Dr. Mallory at the time of the visit.  We reviewed the resident's history and exam and pertinent patient test results.  I agree with the assessment, diagnosis, and plan of care documented in the resident's note. 

## 2014-11-07 NOTE — Patient Instructions (Addendum)
Please continue with your current diabetes regimen; we will recheck your A1c in 2 months.  You will receive a call about physical therapy for your back from our office.  If you need to decrease your gabapentin dose, I will call you in the next few days.  General Instructions:   Thank you for bringing your medicines today. This helps us keep you safe from mistakes.   Progress Toward Treatment Goals:  Treatment Goal 08/14/2014  Hemoglobin A1C -  Blood pressure deteriorated    Self Care Goals & Plans:  Self Care Goal 11/07/2014  Manage my medications bring my medications to every visit; refill my medications on time; take my medicines as prescribed  Monitor my health keep track of my blood glucose; bring my glucose meter and log to each visit  Eat healthy foods drink diet soda or water instead of juice or soda; eat more vegetables; eat foods that are low in salt; eat baked foods instead of fried foods  Be physically active find an activity I enjoy  Meeting treatment goals -    Home Blood Glucose Monitoring 01/02/2014  Check my blood sugar 3 times a day  When to check my blood sugar before breakfast; at bedtime; before lunch     Care Management & Community Referrals:  Referral 11/15/2013  Referrals made for care management support diabetes educator  Referrals made to community resources weight management; exercise/physical therapy; nutrition

## 2014-11-07 NOTE — Progress Notes (Signed)
Subjective:    Patient ID: [name], female    DOB: 08-29-1951, 64 y.o.   MRN: [id number]  HPI Ms. Eddis Pingleton is a 64 yo [place] American woman with a history of uncontrolled DM2, chronic back pain (with lumbar spondylosis), depression, anxiety, CAD and HTN who presents to clinic for DM follow up.   Patient was seen on 12/2 with CBGs ranging from 46-575. Her insulin was decreased at that time and diet was discussed. Then, on her return visit on 12/30, she again had an elevated CBG (this time to 571). At this point, she had run out of lancets. Because her blood sugars had been consistently high on glucometer review at that visit, she was given an increased dose of lantus (51 unis qhs); she was also encouraged to eat a bigger snack on Sundays to prevent hypoglycemia (which she had experienced during long church services on Sundays in December). Today, she has her glucometer with her and thinks her control has been better since her last visit. She has had no hypoglycemic episodes (which for her, present with sweating, exhaustion and feeling "out of it"). She has had no difficulties with her insulin adjustment.  Her main concern today is her mood. She has been followed closely by psychiatry for depression and anxiety (was recently a part of the [facility]) and currently attends a support group (last there 10/24/14), therapy sessions and psychiatric appointments. She has an therapy appointment with Davina Poke tomorrow (11/08/14) and a psychiatry appointment with Dr. Donell Beers on 11/13/14. She feels "down" today and that her stressors at home are weighing on her. She does not wish to speak at length about these stressors, but per prior notes, they include some marital struggles. She denies SI or HI and is hopeful that her therapy session will help her tomorrow.   Finally, she is concerned that no treatments have helped her back pain. The pain initially only affected  her lower back, but more recently, she has been in constant pain in her mid and lower back. The pain is worst when she is active, as when she does housework. She has a history of chronic back pain with lumbar spondylosis. She has been treated with joint injections through PM&R (as recently as 10/31/14), oxycodone, Zanaflex and gabapentin. In the past, notes indicate that the joint injections helped to relieve her pain; however, today she says that this is not the case, and none of her treatments have helped at all.   Review of Systems  Constitutional: Negative for fever, chills, activity change, appetite change and fatigue.       Gaining weight  HENT: Negative.   Eyes: Negative.   Respiratory: Negative.  Negative for shortness of breath.   Cardiovascular: Negative for chest pain, palpitations and leg swelling.  Gastrointestinal: Negative.   Genitourinary: Negative.   Musculoskeletal: Positive for back pain and arthralgias.       Mid and lower back pain worst when moving or doing housework  Skin: Negative for rash and wound.  Neurological: Negative for dizziness, syncope, weakness and numbness.  Psychiatric/Behavioral: Positive for dysphoric mood and decreased concentration. Negative for suicidal ideas. The patient is nervous/anxious.        Objective:   Physical Exam  Constitutional: She is oriented to person, place, and time. She appears well-developed and well-nourished.  Teary throughout interview  HENT:  Head: Normocephalic and atraumatic.  Eyes: Conjunctivae and EOM are normal. Pupils are equal, round, and reactive to  light.  Neck: Normal range of motion.  Cardiovascular: Normal rate, regular rhythm and normal heart sounds.   No murmur heard. Pulmonary/Chest: Effort normal and breath sounds normal. She has no wheezes.  Abdominal: Soft. Bowel sounds are normal. There is no tenderness.  Musculoskeletal: She exhibits no edema.  Tenderness paraspinally on both sides in lower-thoracic  and lumbar regions. No pain or difficulty on getting up and moving to examining table.  Neurological: She is alert and oriented to person, place, and time.  Skin: Skin is warm and dry. No rash noted.  Psychiatric:  Depressed mood; tearing; trouble concentrating (reported by patient); denies SI and HI       Assessment & Plan:  Please see problem oriented charting for assessment & plan  Eleonore ChiquitoJulie Maurizio Geno, MD

## 2014-11-08 ENCOUNTER — Encounter (HOSPITAL_COMMUNITY): Payer: Self-pay | Admitting: Emergency Medicine

## 2014-11-08 ENCOUNTER — Ambulatory Visit (INDEPENDENT_AMBULATORY_CARE_PROVIDER_SITE_OTHER): Payer: 59 | Admitting: Licensed Clinical Social Worker

## 2014-11-08 ENCOUNTER — Other Ambulatory Visit (HOSPITAL_COMMUNITY): Payer: 59

## 2014-11-08 ENCOUNTER — Ambulatory Visit (HOSPITAL_COMMUNITY)
Admission: RE | Admit: 2014-11-08 | Discharge: 2014-11-08 | Disposition: A | Discharge status: Home / Self Care | Payer: 59 | Attending: Psychiatry | Admitting: Psychiatry

## 2014-11-08 ENCOUNTER — Emergency Department (HOSPITAL_COMMUNITY)
Admission: EM | Admit: 2014-11-08 | Discharge: 2014-11-09 | Disposition: A | Discharge status: Home / Self Care | Payer: Medicare Other | Attending: Emergency Medicine | Admitting: Emergency Medicine

## 2014-11-08 DIAGNOSIS — R45851 Suicidal ideations: Secondary | ICD-10-CM | POA: Diagnosis not present

## 2014-11-08 DIAGNOSIS — Z880 Allergy status to penicillin: Secondary | ICD-10-CM | POA: Insufficient documentation

## 2014-11-08 DIAGNOSIS — Z008 Encounter for other general examination: Secondary | ICD-10-CM | POA: Diagnosis present

## 2014-11-08 DIAGNOSIS — Z79899 Other long term (current) drug therapy: Secondary | ICD-10-CM | POA: Diagnosis not present

## 2014-11-08 DIAGNOSIS — I5032 Chronic diastolic (congestive) heart failure: Secondary | ICD-10-CM | POA: Insufficient documentation

## 2014-11-08 DIAGNOSIS — E1143 Type 2 diabetes mellitus with diabetic autonomic (poly)neuropathy: Secondary | ICD-10-CM | POA: Insufficient documentation

## 2014-11-08 DIAGNOSIS — Z87891 Personal history of nicotine dependence: Secondary | ICD-10-CM | POA: Diagnosis not present

## 2014-11-08 DIAGNOSIS — I509 Heart failure, unspecified: Secondary | ICD-10-CM | POA: Diagnosis not present

## 2014-11-08 DIAGNOSIS — K3184 Gastroparesis: Secondary | ICD-10-CM | POA: Insufficient documentation

## 2014-11-08 DIAGNOSIS — E785 Hyperlipidemia, unspecified: Secondary | ICD-10-CM | POA: Diagnosis not present

## 2014-11-08 DIAGNOSIS — K219 Gastro-esophageal reflux disease without esophagitis: Secondary | ICD-10-CM | POA: Diagnosis not present

## 2014-11-08 DIAGNOSIS — E119 Type 2 diabetes mellitus without complications: Secondary | ICD-10-CM | POA: Insufficient documentation

## 2014-11-08 DIAGNOSIS — I2510 Atherosclerotic heart disease of native coronary artery without angina pectoris: Secondary | ICD-10-CM | POA: Insufficient documentation

## 2014-11-08 DIAGNOSIS — Z862 Personal history of diseases of the blood and blood-forming organs and certain disorders involving the immune mechanism: Secondary | ICD-10-CM | POA: Diagnosis not present

## 2014-11-08 DIAGNOSIS — F339 Major depressive disorder, recurrent, unspecified: Secondary | ICD-10-CM | POA: Insufficient documentation

## 2014-11-08 DIAGNOSIS — F330 Major depressive disorder, recurrent, mild: Secondary | ICD-10-CM

## 2014-11-08 DIAGNOSIS — I10 Essential (primary) hypertension: Secondary | ICD-10-CM | POA: Insufficient documentation

## 2014-11-08 DIAGNOSIS — G4730 Sleep apnea, unspecified: Secondary | ICD-10-CM | POA: Diagnosis not present

## 2014-11-08 LAB — URINALYSIS, ROUTINE W REFLEX MICROSCOPIC
Bilirubin Urine: NEGATIVE
Glucose, UA: NEGATIVE mg/dL
Hgb urine dipstick: NEGATIVE
Ketones, ur: NEGATIVE mg/dL
LEUKOCYTES UA: NEGATIVE
Nitrite: NEGATIVE
Protein, ur: NEGATIVE mg/dL
Specific Gravity, Urine: 1.014 (ref 1.005–1.030)
Urobilinogen, UA: 0.2 mg/dL (ref 0.0–1.0)
pH: 5 (ref 5.0–8.0)

## 2014-11-08 LAB — ACETAMINOPHEN LEVEL

## 2014-11-08 LAB — COMPREHENSIVE METABOLIC PANEL
ALBUMIN: 3.9 g/dL (ref 3.5–5.2)
ALK PHOS: 84 U/L (ref 39–117)
ALT: 15 U/L (ref 0–35)
ANION GAP: 10 (ref 5–15)
AST: 20 U/L (ref 0–37)
BUN: 37 mg/dL — AB (ref 6–23)
CALCIUM: 9.1 mg/dL (ref 8.4–10.5)
CO2: 22 mmol/L (ref 19–32)
CREATININE: 1.83 mg/dL — AB (ref 0.50–1.10)
Chloride: 103 mEq/L (ref 96–112)
GFR calc Af Amer: 33 mL/min — ABNORMAL LOW (ref >90)
GFR, EST NON AFRICAN AMERICAN: 28 mL/min — AB (ref >90)
Glucose, Bld: 242 mg/dL — ABNORMAL HIGH (ref 70–99)
Potassium: 4.4 mmol/L (ref 3.5–5.1)
Sodium: 135 mmol/L (ref 135–145)
Total Bilirubin: 0.2 mg/dL — ABNORMAL LOW (ref 0.3–1.2)
Total Protein: 7.5 g/dL (ref 6.0–8.3)

## 2014-11-08 LAB — CBC
HCT: 33.1 % — ABNORMAL LOW (ref 36.0–46.0)
HEMOGLOBIN: 10.6 g/dL — AB (ref 12.0–15.0)
MCH: 24.8 pg — ABNORMAL LOW (ref 26.0–34.0)
MCHC: 32 g/dL (ref 30.0–36.0)
MCV: 77.5 fL — ABNORMAL LOW (ref 78.0–100.0)
Platelets: 231 10*3/uL (ref 150–400)
RBC: 4.27 MIL/uL (ref 3.87–5.11)
RDW: 17.3 % — ABNORMAL HIGH (ref 11.5–15.5)
WBC: 9.4 10*3/uL (ref 4.0–10.5)

## 2014-11-08 LAB — RAPID URINE DRUG SCREEN, HOSP PERFORMED
Amphetamines: NOT DETECTED
Barbiturates: NOT DETECTED
Benzodiazepines: POSITIVE — AB
COCAINE: NOT DETECTED
Opiates: NOT DETECTED
Tetrahydrocannabinol: NOT DETECTED

## 2014-11-08 LAB — SALICYLATE LEVEL: Salicylate Lvl: <4 mg/dL (ref 2.8–20.0)

## 2014-11-08 LAB — ETHANOL: Alcohol, Ethyl (B): <5 mg/dL (ref 0–9)

## 2014-11-08 LAB — CBG MONITORING, ED: Glucose-Capillary: 265 mg/dL — ABNORMAL HIGH (ref 70–99)

## 2014-11-08 MED ORDER — GABAPENTIN 800 MG PO TABS: 800.0000 mg | ORAL_TABLET | Freq: Two times a day (BID) | ORAL | Status: DC

## 2014-11-08 MED ORDER — LOSARTAN POTASSIUM 50 MG PO TABS: 50.0000 mg | ORAL_TABLET | Freq: Every morning | ORAL | Status: DC

## 2014-11-08 MED ORDER — GABAPENTIN 800 MG PO TABS
800.0000 mg | ORAL_TABLET | Freq: Two times a day (BID) | ORAL | Status: DC
  Filled 2014-11-08: qty 1

## 2014-11-08 MED ORDER — ASPIRIN 81 MG PO CHEW
81.0000 mg | CHEWABLE_TABLET | Freq: Every day | ORAL | Status: DC
  Administered 2014-11-09: 81 mg via ORAL
  Filled 2014-11-08 (×2): qty 1

## 2014-11-08 MED ORDER — INSULIN ASPART 100 UNIT/ML ~~LOC~~ SOLN
0.0000 [IU] | Freq: Three times a day (TID) | SUBCUTANEOUS | Status: DC
  Administered 2014-11-09: 5 [IU] via SUBCUTANEOUS
  Administered 2014-11-09: 3 [IU] via SUBCUTANEOUS
  Filled 2014-11-08: qty 1

## 2014-11-08 MED ORDER — INSULIN ASPART 100 UNIT/ML ~~LOC~~ SOLN
0.0000 [IU] | Freq: Every day | SUBCUTANEOUS | Status: DC
  Administered 2014-11-08: 3 [IU] via SUBCUTANEOUS
  Filled 2014-11-08 (×2): qty 1

## 2014-11-08 MED ORDER — SENNA 8.6 MG PO TABS
4.0000 | ORAL_TABLET | Freq: Every day | ORAL | Status: DC | PRN
  Filled 2014-11-08: qty 4

## 2014-11-08 MED ORDER — BUPROPION HCL ER (XL) 300 MG PO TB24
450.0000 mg | ORAL_TABLET | Freq: Every morning | ORAL | Status: DC
  Administered 2014-11-09: 450 mg via ORAL
  Filled 2014-11-08: qty 1

## 2014-11-08 MED ORDER — LORAZEPAM 0.5 MG PO TABS
0.5000 mg | ORAL_TABLET | Freq: Three times a day (TID) | ORAL | Status: DC
Start: 2014-11-08 — End: 2014-11-09
  Administered 2014-11-08 – 2014-11-09 (×2): 0.5 mg via ORAL
  Filled 2014-11-08 (×3): qty 1

## 2014-11-08 MED ORDER — ROSUVASTATIN CALCIUM 10 MG PO TABS
10.0000 mg | ORAL_TABLET | Freq: Every day | ORAL | Status: DC
  Administered 2014-11-08: 10 mg via ORAL
  Filled 2014-11-08 (×4): qty 1

## 2014-11-08 MED ORDER — DOCUSATE SODIUM 50 MG PO CAPS
50.0000 mg | ORAL_CAPSULE | Freq: Every day | ORAL | Status: DC | PRN
  Filled 2014-11-08: qty 1

## 2014-11-08 MED ORDER — INSULIN ASPART 100 UNIT/ML ~~LOC~~ SOLN: 4.0000 [IU] | Freq: Three times a day (TID) | SUBCUTANEOUS | Status: DC

## 2014-11-08 MED ORDER — LOSARTAN POTASSIUM 50 MG PO TABS
50.0000 mg | ORAL_TABLET | Freq: Every day | ORAL | Status: DC
  Administered 2014-11-09: 50 mg via ORAL
  Filled 2014-11-08: qty 1

## 2014-11-08 MED ORDER — PANTOPRAZOLE SODIUM 40 MG PO TBEC
40.0000 mg | DELAYED_RELEASE_TABLET | Freq: Every day | ORAL | Status: DC
  Administered 2014-11-08 – 2014-11-09 (×2): 40 mg via ORAL
  Filled 2014-11-08 (×2): qty 1

## 2014-11-08 MED ORDER — ISOSORBIDE MONONITRATE ER 60 MG PO TB24
60.0000 mg | ORAL_TABLET | Freq: Every morning | ORAL | Status: DC
  Administered 2014-11-09: 60 mg via ORAL
  Filled 2014-11-08: qty 1

## 2014-11-08 MED ORDER — ONDANSETRON HCL 4 MG PO TABS: 4.0000 mg | ORAL_TABLET | Freq: Three times a day (TID) | ORAL | Status: DC | PRN

## 2014-11-08 MED ORDER — CLONIDINE HCL 0.3 MG/24HR TD PTWK
0.3000 mg | MEDICATED_PATCH | TRANSDERMAL | Status: DC
  Administered 2014-11-08: 0.3 mg via TRANSDERMAL
  Filled 2014-11-08 (×2): qty 1

## 2014-11-08 MED ORDER — INSULIN GLARGINE 100 UNIT/ML ~~LOC~~ SOLN
51.0000 [IU] | Freq: Every day | SUBCUTANEOUS | Status: DC
  Administered 2014-11-08: 51 [IU] via SUBCUTANEOUS
  Filled 2014-11-08 (×3): qty 0.51

## 2014-11-08 MED ORDER — INSULIN GLARGINE 100 UNIT/ML SOLOSTAR PEN: 51.0000 [IU] | PEN_INJECTOR | Freq: Every day | SUBCUTANEOUS | Status: DC

## 2014-11-08 MED ORDER — GABAPENTIN 400 MG PO CAPS
800.0000 mg | ORAL_CAPSULE | Freq: Two times a day (BID) | ORAL | Status: DC
  Administered 2014-11-08 – 2014-11-09 (×2): 800 mg via ORAL
  Filled 2014-11-08 (×3): qty 2

## 2014-11-08 MED ORDER — BUSPIRONE HCL 10 MG PO TABS
10.0000 mg | ORAL_TABLET | Freq: Two times a day (BID) | ORAL | Status: DC
  Administered 2014-11-08: 10 mg via ORAL
  Filled 2014-11-08: qty 1

## 2014-11-08 MED ORDER — METOPROLOL SUCCINATE ER 25 MG PO TB24
25.0000 mg | ORAL_TABLET | Freq: Every morning | ORAL | Status: DC
  Administered 2014-11-09: 25 mg via ORAL
  Filled 2014-11-08: qty 1

## 2014-11-08 MED ORDER — FUROSEMIDE 40 MG PO TABS
40.0000 mg | ORAL_TABLET | Freq: Every morning | ORAL | Status: DC
  Administered 2014-11-09: 40 mg via ORAL
  Filled 2014-11-08: qty 1

## 2014-11-08 NOTE — Assessment & Plan Note (Signed)
Lab Results  Component Value Date   HGBA1C 9.3 10/23/2014   HGBA1C 9.5 08/14/2014   HGBA1C 9.2 04/10/2014     Assessment: Diabetes control:  not controlled Progress toward A1C goal:   not at goal Comments: Lantus recently increased to 51 U and appears to have better control today. CBG 155 (as opposed to 500s on last visit). Had no lows on meter today. Verbalizes better understanding and better strategies for managing as outpatient (including during long church services).  Plan: Medications:  continue current medications Home glucose monitoring: Frequency:   Timing:   Instruction/counseling given: reminded to bring blood glucose meter & log to each visit Educational resources provided:  Yes Self management tools provided: copy of home glucose meter download Other plans: Recheck A1c at visit in 2 months

## 2014-11-08 NOTE — ED Notes (Signed)
Pt states "I need my stuff and I need to go"; pt advised that she is awaiting placement; pt states "You guys are to busy and maybe its not meant for me to be safe. I will just go home and take care of it myself"; Dr Romeo AppleHarrison made aware; Dr Romeo AppleHarrison to come speak to pt.

## 2014-11-08 NOTE — Assessment & Plan Note (Addendum)
Patient has had a wide variety of treatments for her chronic back pain. Per PM&R notes, she responded well to joint injections, but patient denies that these helped; she continues to receive them (most recently on 10/31/14). Oxycodone, zanaflex and gabapentin do not seem to be helping either.   - Referral to PT  - Continue to follow with PM&R  - Given patient's decreased GFR (34 today); need to continue to taper, as recommended dose for this GFR is 200-700 mg BID. Decrease gabapentin from 800 mg TID to 800 mg BID for 3 weeks. Will need to decrease dose further to 700 mg BID (maximum dose) at next appointment. Patient has follow up in 3 weeks.   Called patients to deliver these results and give direction to decrease frequency of dose but patient not home; left message for her to call clinic with her husband
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
## 2014-11-08 NOTE — ED Provider Notes (Signed)
CSN: [id number]     Arrival date & time 11/08/14  1748 History   First MD Initiated Contact with Patient 11/08/14 1812     Chief Complaint  Patient presents with  . Medical Clearance     (Consider location/radiation/quality/duration/timing/severity/associated sxs/prior Treatment) Patient is a 64 y.o. female presenting with mental health disorder. The history is provided by the patient.  Mental Health Problem Presenting symptoms: suicidal thoughts   Degree of incapacity (severity):  Mild Onset quality:  Gradual Timing:  Constant Progression:  Worsening Chronicity:  Chronic Context: stressful life event   Treatment compliance:  All of the time Relieved by:  Nothing Worsened by:  Nothing tried Ineffective treatments:  None tried Associated symptoms: no abdominal pain, no chest pain, no fatigue and no headaches     Past Medical History  Diagnosis Date  . Hypertension   . Peripheral neuropathy   . Cervicalgia   . Low back pain syndrome   . Hyperlipidemia   . Vitamin D deficiency   . Vitamin B12 deficiency   . Iliotibial band syndrome   . Anemia   . Borderline personality disorder   . Nonorganic psychosis   . GERD (gastroesophageal reflux disease)   . Diabetes mellitus   . Depression   . Diabetic gastroparesis   . CAD (coronary artery disease)     Catherization 11/18/09>nonobstructive CAD , normal LV function, EF 65%  . Health maintenance examination     Colonoscopy 2009> normal; Diabetic eye exam 12/2008. No diabetic retinopathy; Mammogram 11/10> No evidence of malignancy, DXA 03/14/13 : normal  . Headache(784.0)   . Arthritis   . SBO (small bowel obstruction) 01/09/2013  . Diastolic CHF     Grade I, on Echo 06/2013, EF 55-60%  . Sleep apnea     does not wear CPAP   . Diabetes mellitus, type II   . Wears glasses   . Wears dentures     upper  . Gout   . Right carpal tunnel syndrome 09/13/2014   Past Surgical History  Procedure Laterality Date  . Abdominal  hysterectomy    . Hand surgery  1992    rt  . Bowel resection N/A 01/10/2013    Procedure: SMALL BOWEL RESECTION;  Surgeon: Lodema Pilot, DO;  Location: [facility];  Service: General;  Laterality: N/A;  . Lysis of adhesion N/A 01/10/2013    Procedure: LYSIS OF ADHESION;  Surgeon: Lodema Pilot, DO;  Location: [facility];  Service: General;  Laterality: N/A;  . Laparotomy N/A 01/10/2013    Procedure: EXPLORATORY LAPAROTOMY;  Surgeon: Lodema Pilot, DO;  Location: [facility];  Service: General;  Laterality: N/A;  . Incisional hernia repair N/A 03/01/2014    Procedure: LAPAROSCOPIC INCISIONAL HERNIA;  Surgeon: Axel Filler, MD;  Location: [facility];  Service: General;  Laterality: N/A;  . Insertion of mesh N/A 03/01/2014    Procedure: INSERTION OF MESH;  Surgeon: Axel Filler, MD;  Location: [facility];  Service: General;  Laterality: N/A;  . Hernia repair  03/01/14    Lap incisional hernia repair w/mesh  . Appendectomy    . Carpal tunnel release Right 09/13/2014    Procedure: RIGHT WRIST CARPAL TUNNEL RELEASE;  Surgeon: Eulas Post, MD;  Location: [facility];  Service: Orthopedics;  Laterality: Right;  . Steriod injection Left 09/13/2014    Procedure: STEROID INJECTION LEFT HAND;  Surgeon: Eulas Post, MD;  Location: [facility];  Service: Orthopedics;  Laterality: Left;   Family History  Problem Relation Age of Onset  . Diabetes Mother   . Hypertension Mother   . Hyperlipidemia Mother   . Arthritis Mother   . Depression Mother   . Heart disease Father   . Diabetes Sister   . Diabetes Brother   . Heart disease Brother   . Heart disease Paternal Grandmother   . Seizures Brother    History  Substance Use Topics  . Smoking status: Former Smoker    Types: Cigarettes    Quit date: 10/25/2002  . Smokeless tobacco: Never Used  . Alcohol Use: No   OB History    No data available     Review of Systems  Constitutional: Negative for fever and fatigue.  HENT: Negative for  congestion and drooling.   Eyes: Negative for pain.  Respiratory: Negative for cough and shortness of breath.   Cardiovascular: Negative for chest pain.  Gastrointestinal: Negative for nausea, vomiting, abdominal pain and diarrhea.  Genitourinary: Negative for dysuria and hematuria.  Musculoskeletal: Negative for back pain, gait problem and neck pain.  Skin: Negative for color change.  Neurological: Negative for dizziness and headaches.  Hematological: Negative for adenopathy.  Psychiatric/Behavioral: Positive for suicidal ideas. Negative for behavioral problems.  All other systems reviewed and are negative.     Allergies  Cimetidine; Indomethacin; Penicillins; Indomethacin; and Sulfonamide derivatives  Home Medications   Prior to Admission medications   Medication Sig Start Date End Date Taking? Authorizing Provider  ACCU-CHEK FASTCLIX LANCETS MISC Use to Check Blood Sugars up to 4 Times a Day. ICD-10 Code: E11.65 08/07/14   Julious Oka, MD  acetaminophen-codeine (TYLENOL #4) 300-60 MG per tablet take 1 tablet by mouth three times a day 09/16/14   Charlett Blake, MD  aspirin 81 MG chewable tablet Chew 81 mg by mouth daily.    Historical Provider, MD  buPROPion (WELLBUTRIN XL) 150 MG 24 hr tablet Take 3 tablets (450 mg total) by mouth every morning. 09/05/14   Norma Fredrickson, MD  busPIRone (BUSPAR) 10 MG tablet Take 10-20 mg by mouth 2 (two) times daily. Take 15 mg every morning and30 mg every evening    Historical Provider, MD  cloNIDine (CATAPRES - DOSED IN MG/24 HR) 0.3 mg/24hr patch Place 1 patch (0.3 mg total) onto the skin every 7 (seven) days. 09/25/14   Julious Oka, MD  dexlansoprazole (DEXILANT) 60 MG capsule Take 60 mg by mouth daily.     Historical Provider, MD  diazepam (VALIUM) 5 MG tablet Take 1 tablet (5 mg total) by mouth once. Take prior to injection 10/01/14   Charlett Blake, MD  Docusate Sodium (COLACE PO) Take 2 tablets by mouth daily as needed (for  constipation).     Historical Provider, MD  furosemide (LASIX) 40 MG tablet Take 1 tablet (40 mg total) by mouth every morning. 09/25/14   Julious Oka, MD  gabapentin (NEURONTIN) 800 MG tablet Take 1 tablet (800 mg total) by mouth 2 (two) times daily. 11/08/14   Drucilla Schmidt, MD  glucose blood (ACCU-CHEK AVIVA PLUS) test strip Check blood sugar up to 4 times a day as instructed 09/03/14   Aldine Contes, MD  insulin aspart (NOVOLOG FLEXPEN) 100 UNIT/ML FlexPen inject with meals and snacks up to 8 times a day as directed by your aviva glucometer. 09/03/14   Julious Oka, MD  Insulin Glargine (LANTUS SOLOSTAR) 100 UNIT/ML Solostar Pen Inject 51 Units into the skin at bedtime. 10/23/14   Blain Pais, MD  Insulin Pen Needle (  BD PEN NEEDLE NANO U/F) 32G X 4 MM MISC USE TO TEST UP TO 5 TIMES A DAY ACCORDING TO CBG 09/03/14   Nischal Narendra, MD  isosorbide mononitrate (IMDUR) 60 MG 24 hr tablet Take 1 tablet (60 mg total) by mouth every morning. 06/18/14   Julious Oka, MD  Lancets Misc. (ACCU-CHEK FASTCLIX LANCET) KIT Use to check blood sugars up to 5 times a day 10/15/14   Julious Oka, MD  LORazepam (ATIVAN) 0.5 MG tablet Take 1 tablet (0.5 mg total) by mouth every 8 (eight) hours. 10/24/14   Clarene Reamer, MD  losartan (COZAAR) 100 MG tablet Take 50 mg by mouth every morning.    Historical Provider, MD  metoprolol succinate (TOPROL-XL) 25 MG 24 hr tablet Take 1 tablet (25 mg total) by mouth every morning. 06/18/14   Julious Oka, MD  ondansetron (ZOFRAN) 4 MG tablet Take 4 mg by mouth every 8 (eight) hours as needed for nausea.  04/11/14   Historical Provider, MD  rosuvastatin (CRESTOR) 10 MG tablet Take 1 tablet (10 mg total) by mouth at bedtime. 03/08/14 03/08/15  Othella Boyer, MD  senna (SENOKOT) 8.6 MG TABS Take 4 tablets by mouth daily as needed (for constipation).     Historical Provider, MD  sennosides-docusate sodium (SENOKOT-S) 8.6-50 MG tablet Take 2 tablets by mouth daily. 09/13/14    Johnny Bridge, MD  tiZANidine (ZANAFLEX) 2 MG tablet Take 1 tablet (2 mg total) by mouth every 8 (eight) hours as needed for muscle spasms. 09/10/14   Charlett Blake, MD   BP 118/65 mmHg  Pulse 85  Temp(Src) 98.1 F (36.7 C) (Oral)  Resp 20  SpO2 100% Physical Exam  Constitutional: She is oriented to person, place, and time. She appears well-developed and well-nourished.  HENT:  Head: Normocephalic.  Mouth/Throat: Oropharynx is clear and moist. No oropharyngeal exudate.  Eyes: Conjunctivae and EOM are normal. Pupils are equal, round, and reactive to light.  Neck: Normal range of motion. Neck supple.  Cardiovascular: Normal rate, regular rhythm, normal heart sounds and intact distal pulses.  Exam reveals no gallop and no friction rub.   No murmur heard. Pulmonary/Chest: Effort normal and breath sounds normal. No respiratory distress. She has no wheezes.  Abdominal: Soft. Bowel sounds are normal. There is no tenderness. There is no rebound and no guarding.  Musculoskeletal: Normal range of motion. She exhibits no edema or tenderness.  Neurological: She is alert and oriented to person, place, and time.  Skin: Skin is warm and dry.  Psychiatric: She has a normal mood and affect. Her behavior is normal.  Nursing note and vitals reviewed.   ED Course  Procedures (including critical care time) Labs Review Labs Reviewed  ACETAMINOPHEN LEVEL - Abnormal; Notable for the following:    Acetaminophen (Tylenol), Serum <10.0 (*)    All other components within normal limits  CBC - Abnormal; Notable for the following:    Hemoglobin 10.6 (*)    HCT 33.1 (*)    MCV 77.5 (*)    MCH 24.8 (*)    RDW 17.3 (*)    All other components within normal limits  COMPREHENSIVE METABOLIC PANEL - Abnormal; Notable for the following:    Glucose, Bld 242 (*)    BUN 37 (*)    Creatinine, Ser 1.83 (*)    Total Bilirubin 0.2 (*)    GFR calc non Af Amer 28 (*)    GFR calc Af Amer 33 (*)  All other  components within normal limits  URINE RAPID DRUG SCREEN (HOSP PERFORMED) - Abnormal; Notable for the following:    Benzodiazepines POSITIVE (*)    All other components within normal limits  CBG MONITORING, ED - Abnormal; Notable for the following:    Glucose-Capillary 265 (*)    All other components within normal limits  ETHANOL  SALICYLATE LEVEL  URINALYSIS, ROUTINE W REFLEX MICROSCOPIC    Imaging Review No results found.   EKG Interpretation None      MDM   Final diagnoses:  Suicidal ideations    6:34 PM 64 y.o. female w hx of HTN, anemia, DM, CAD who presents with suicidal ideations. She was seen by her therapist today who left a note. She notes that she has had worsening depression due to marital issues and medical issues. She states that she has thoughts of discontinuing her medications so that she would die. She is afebrile and vital signs are unremarkable here. She has no other complaints on exam. Will get psych screening labs and consult TTS.  9:00 PM patient requesting to leave. Will IVC.  The patient's creatinine is proximal to baseline. Labs otherwise unremarkable. TTS has been consulted. I placed orders for the patient's home medications and started her on sliding scale insulin as well as her normal basal insulin. Pt medically cleared from my perspective.   Pamella Pert, MD 11/08/14 [phone]

## 2014-11-08 NOTE — ED Notes (Signed)
Pt. To [facility] from ED ambulatory without difficulty, to room  . Pt. Is alert and oriented, warm and dry in no distress. Pt. Denies HI, and AVH. Pt. Still endorsing SI to stop taking meds. Pt. Calm and cooperative. Pt. Made aware of security cameras and Q15 minute rounds. Pt. Encouraged to let Nursing staff know of any concerns or needs.

## 2014-11-08 NOTE — Assessment & Plan Note (Signed)
Patient has suffered from MDD, anxiety and suicide attempts (1990-2000), currently appears depressed by history and exam. Followed closely by Dr. Donell BeersPlovsky. Also takes part in group therapy and individual therapy. Has therapy today and appointment with Dr. Donell BeersPlovsky next week. Her MDD may be playing a role in the worsening of her chronic back pain (shows elements of hopelessness about the pain).  - Continue current medication regimen (Wellbutrin, Buspar, Valium, Ativan) - Continue to follow with therapy and Dr. Donell BeersPlovsky

## 2014-11-08 NOTE — ED Notes (Signed)
Pt seen her therapist this evening and told them that she was through with life and was down with life. Pt was sent over here to have medical clearance done until a bed was avaliable

## 2014-11-08 NOTE — Assessment & Plan Note (Signed)
BP Readings from Last 3 Encounters:  11/07/14 132/57  10/31/14 130/68  10/23/14 133/64    Lab Results  Component Value Date   NA 140 11/07/2014   K 4.4 11/07/2014   CREATININE 1.78* 11/07/2014    Assessment: Blood pressure control:  stable and well-controlled Progress toward BP goal:   at goal Comments: Patient's BP has been steadily at goal for last several visits.  Plan: Medications:  continue current medications -- clonidine, lasix, imdur, cozaar, toprol xl

## 2014-11-08 NOTE — Progress Notes (Signed)
   THERAPIST PROGRESS NOTE  Session Time: 3:00PM-3:51PM  Participation Level: Active  Behavioral Response: Neat and Well GroomedAlertDepressed and Hopeless  Type of Therapy: Individual Therapy  Treatment Goals addressed: Diagnosis: Major depressive disorder, recurrent episode, mild  Interventions: Motivational Interviewing and Supportive  Summary: [name] is a 64 y.o. female who presents with symptoms of depression. Patient reports that she has been feeling pain and yesterday she went to the doctor for feeling like things were crawling on her, but they did not find anything. Patient discussed the vacation that she went on with her husband and reports that the relationship has not progressed. Patient reports that she has no desire to go out of the house or engage in activities or be around or talk to other people. Patient reports that she has been having suicidal thoughts and crying spells and began to cry. Patient reports that she is tired of dealing with pain, diabetes, and depression. Patient reports that her sugar has dropped down into the fifties and she plans to not take her medication and "let nature take its course" meaning that she would like to duie by not caring for herself and checking her blood pressure or diabetes.  .   Suicidal/Homicidal: Yeswith intent/plan  Therapist Response: LCSW informed patient that this would be her last day at this location and the patient will transfer to another LCSW. LCSW followed up with patient about group. LCSW followed up with the patient about her trip and her relationship with her husband. LCSW listened actively. LCSW assessed patient for safety and walked the patient upstairs after patient discussed suicidal ideations with a plan of self neglect.  [facility] and sat with her until she was called to the back around 4:24PM.  Plan: [facility]. Patient will see another LCSW. .  Diagnosis: Major depressive disorder, recurrent episode, mild    Genice RougeHerbin, [name] Rix M, LCSW 11/08/2014

## 2014-11-09 ENCOUNTER — Encounter (HOSPITAL_COMMUNITY): Payer: Self-pay | Admitting: *Deleted

## 2014-11-09 ENCOUNTER — Inpatient Hospital Stay (HOSPITAL_COMMUNITY)
Admission: AD | Admit: 2014-11-09 | Discharge: 2014-11-13 | DRG: 885 | Disposition: A | Discharge status: Home / Self Care | Payer: 59 | Attending: Psychiatry | Admitting: Psychiatry

## 2014-11-09 DIAGNOSIS — K3184 Gastroparesis: Secondary | ICD-10-CM | POA: Diagnosis present

## 2014-11-09 DIAGNOSIS — Z833 Family history of diabetes mellitus: Secondary | ICD-10-CM | POA: Diagnosis not present

## 2014-11-09 DIAGNOSIS — F603 Borderline personality disorder: Secondary | ICD-10-CM | POA: Diagnosis present

## 2014-11-09 DIAGNOSIS — Z8249 Family history of ischemic heart disease and other diseases of the circulatory system: Secondary | ICD-10-CM

## 2014-11-09 DIAGNOSIS — G4730 Sleep apnea, unspecified: Secondary | ICD-10-CM | POA: Diagnosis present

## 2014-11-09 DIAGNOSIS — I2510 Atherosclerotic heart disease of native coronary artery without angina pectoris: Secondary | ICD-10-CM | POA: Diagnosis present

## 2014-11-09 DIAGNOSIS — M1990 Unspecified osteoarthritis, unspecified site: Secondary | ICD-10-CM | POA: Diagnosis present

## 2014-11-09 DIAGNOSIS — Z609 Problem related to social environment, unspecified: Secondary | ICD-10-CM | POA: Diagnosis present

## 2014-11-09 DIAGNOSIS — F419 Anxiety disorder, unspecified: Secondary | ICD-10-CM | POA: Diagnosis present

## 2014-11-09 DIAGNOSIS — E785 Hyperlipidemia, unspecified: Secondary | ICD-10-CM | POA: Diagnosis present

## 2014-11-09 DIAGNOSIS — I10 Essential (primary) hypertension: Secondary | ICD-10-CM | POA: Diagnosis present

## 2014-11-09 DIAGNOSIS — F332 Major depressive disorder, recurrent severe without psychotic features: Principal | ICD-10-CM | POA: Diagnosis present

## 2014-11-09 DIAGNOSIS — Z87891 Personal history of nicotine dependence: Secondary | ICD-10-CM

## 2014-11-09 DIAGNOSIS — R059 Cough, unspecified: Secondary | ICD-10-CM | POA: Insufficient documentation

## 2014-11-09 DIAGNOSIS — E1143 Type 2 diabetes mellitus with diabetic autonomic (poly)neuropathy: Secondary | ICD-10-CM | POA: Diagnosis present

## 2014-11-09 DIAGNOSIS — Z794 Long term (current) use of insulin: Secondary | ICD-10-CM

## 2014-11-09 DIAGNOSIS — K219 Gastro-esophageal reflux disease without esophagitis: Secondary | ICD-10-CM | POA: Diagnosis present

## 2014-11-09 DIAGNOSIS — G4700 Insomnia, unspecified: Secondary | ICD-10-CM | POA: Diagnosis present

## 2014-11-09 DIAGNOSIS — I5032 Chronic diastolic (congestive) heart failure: Secondary | ICD-10-CM | POA: Diagnosis present

## 2014-11-09 DIAGNOSIS — R05 Cough: Secondary | ICD-10-CM | POA: Insufficient documentation

## 2014-11-09 DIAGNOSIS — R45851 Suicidal ideations: Secondary | ICD-10-CM | POA: Diagnosis not present

## 2014-11-09 LAB — GLUCOSE, CAPILLARY
Glucose-Capillary: 147 mg/dL — ABNORMAL HIGH (ref 70–99)
Glucose-Capillary: 268 mg/dL — ABNORMAL HIGH (ref 70–99)

## 2014-11-09 LAB — CBG MONITORING, ED
GLUCOSE-CAPILLARY: 163 mg/dL — AB (ref 70–99)
Glucose-Capillary: 226 mg/dL — ABNORMAL HIGH (ref 70–99)

## 2014-11-09 MED ORDER — ACETAMINOPHEN 325 MG PO TABS
650.0000 mg | ORAL_TABLET | Freq: Four times a day (QID) | ORAL | Status: DC | PRN
  Administered 2014-11-10: 650 mg via ORAL
  Filled 2014-11-09: qty 2

## 2014-11-09 MED ORDER — GABAPENTIN 400 MG PO CAPS
800.0000 mg | ORAL_CAPSULE | Freq: Two times a day (BID) | ORAL | Status: DC
  Administered 2014-11-10: 800 mg via ORAL
  Filled 2014-11-09 (×4): qty 2

## 2014-11-09 MED ORDER — INSULIN ASPART 100 UNIT/ML ~~LOC~~ SOLN
0.0000 [IU] | Freq: Three times a day (TID) | SUBCUTANEOUS | Status: DC
  Administered 2014-11-10 (×2): 5 [IU] via SUBCUTANEOUS
  Administered 2014-11-10: 3 [IU] via SUBCUTANEOUS
  Administered 2014-11-11: 5 [IU] via SUBCUTANEOUS
  Administered 2014-11-11: 3 [IU] via SUBCUTANEOUS
  Administered 2014-11-11: 8 [IU] via SUBCUTANEOUS
  Administered 2014-11-12 (×2): 3 [IU] via SUBCUTANEOUS
  Administered 2014-11-12: 5 [IU] via SUBCUTANEOUS
  Administered 2014-11-13: 2 [IU] via SUBCUTANEOUS

## 2014-11-09 MED ORDER — ONDANSETRON HCL 4 MG PO TABS: 4.0000 mg | ORAL_TABLET | Freq: Three times a day (TID) | ORAL | Status: DC | PRN

## 2014-11-09 MED ORDER — LORAZEPAM 0.5 MG PO TABS
0.5000 mg | ORAL_TABLET | Freq: Three times a day (TID) | ORAL | Status: DC
  Administered 2014-11-09 – 2014-11-10 (×3): 0.5 mg via ORAL
  Filled 2014-11-09 (×2): qty 1

## 2014-11-09 MED ORDER — BUPROPION HCL ER (XL) 300 MG PO TB24
450.0000 mg | ORAL_TABLET | Freq: Every morning | ORAL | Status: DC
  Administered 2014-11-10 – 2014-11-13 (×4): 450 mg via ORAL
  Filled 2014-11-09 (×7): qty 1

## 2014-11-09 MED ORDER — FUROSEMIDE 40 MG PO TABS
40.0000 mg | ORAL_TABLET | Freq: Every morning | ORAL | Status: DC
  Administered 2014-11-10 – 2014-11-13 (×4): 40 mg via ORAL
  Filled 2014-11-09 (×2): qty 1
  Filled 2014-11-09: qty 2
  Filled 2014-11-09 (×3): qty 1

## 2014-11-09 MED ORDER — ISOSORBIDE MONONITRATE ER 60 MG PO TB24
60.0000 mg | ORAL_TABLET | Freq: Every morning | ORAL | Status: DC
  Administered 2014-11-10 – 2014-11-13 (×4): 60 mg via ORAL
  Filled 2014-11-09 (×5): qty 1

## 2014-11-09 MED ORDER — LOSARTAN POTASSIUM 50 MG PO TABS
50.0000 mg | ORAL_TABLET | Freq: Every day | ORAL | Status: DC
  Administered 2014-11-10 – 2014-11-13 (×4): 50 mg via ORAL
  Filled 2014-11-09 (×5): qty 1

## 2014-11-09 MED ORDER — INSULIN ASPART 100 UNIT/ML ~~LOC~~ SOLN
0.0000 [IU] | Freq: Every day | SUBCUTANEOUS | Status: DC
  Administered 2014-11-09: 3 [IU] via SUBCUTANEOUS
  Administered 2014-11-12: 2 [IU] via SUBCUTANEOUS

## 2014-11-09 MED ORDER — CLONIDINE HCL 0.3 MG/24HR TD PTWK: 0.3000 mg | MEDICATED_PATCH | TRANSDERMAL | Status: DC

## 2014-11-09 MED ORDER — SENNA 8.6 MG PO TABS: 4.0000 | ORAL_TABLET | Freq: Every day | ORAL | Status: DC | PRN

## 2014-11-09 MED ORDER — METOPROLOL SUCCINATE ER 25 MG PO TB24
25.0000 mg | ORAL_TABLET | Freq: Every morning | ORAL | Status: DC
  Administered 2014-11-10 – 2014-11-13 (×4): 25 mg via ORAL
  Filled 2014-11-09 (×6): qty 1

## 2014-11-09 MED ORDER — BUSPIRONE HCL 10 MG PO TABS
20.0000 mg | ORAL_TABLET | Freq: Every evening | ORAL | Status: DC
  Filled 2014-11-09 (×2): qty 2

## 2014-11-09 MED ORDER — INSULIN ASPART 100 UNIT/ML ~~LOC~~ SOLN
4.0000 [IU] | Freq: Three times a day (TID) | SUBCUTANEOUS | Status: DC
  Administered 2014-11-10 – 2014-11-12 (×8): 4 [IU] via SUBCUTANEOUS

## 2014-11-09 MED ORDER — INSULIN GLARGINE 100 UNIT/ML ~~LOC~~ SOLN
51.0000 [IU] | Freq: Every day | SUBCUTANEOUS | Status: DC
  Administered 2014-11-09 – 2014-11-10 (×2): 51 [IU] via SUBCUTANEOUS

## 2014-11-09 MED ORDER — MAGNESIUM HYDROXIDE 400 MG/5ML PO SUSP: 30.0000 mL | Freq: Every day | ORAL | Status: DC | PRN

## 2014-11-09 MED ORDER — ROSUVASTATIN CALCIUM 10 MG PO TABS
10.0000 mg | ORAL_TABLET | Freq: Every day | ORAL | Status: DC
  Administered 2014-11-11 – 2014-11-12 (×2): 10 mg via ORAL
  Filled 2014-11-09 (×6): qty 1

## 2014-11-09 MED ORDER — BUSPIRONE HCL 10 MG PO TABS: 10.0000 mg | ORAL_TABLET | Freq: Two times a day (BID) | ORAL | Status: DC

## 2014-11-09 MED ORDER — ALUM & MAG HYDROXIDE-SIMETH 200-200-20 MG/5ML PO SUSP
30.0000 mL | ORAL | Status: DC | PRN
Start: 2014-11-09 — End: 2014-11-13

## 2014-11-09 MED ORDER — PANTOPRAZOLE SODIUM 40 MG PO TBEC
40.0000 mg | DELAYED_RELEASE_TABLET | Freq: Every day | ORAL | Status: DC
  Administered 2014-11-10 – 2014-11-13 (×4): 40 mg via ORAL
  Filled 2014-11-09 (×5): qty 1

## 2014-11-09 MED ORDER — BUSPIRONE HCL 10 MG PO TABS
10.0000 mg | ORAL_TABLET | ORAL | Status: DC
  Administered 2014-11-10: 10 mg via ORAL
  Filled 2014-11-09: qty 1
  Filled 2014-11-09: qty 2
  Filled 2014-11-09: qty 1

## 2014-11-09 MED ORDER — ASPIRIN 81 MG PO CHEW
81.0000 mg | CHEWABLE_TABLET | Freq: Every day | ORAL | Status: DC
  Administered 2014-11-10 – 2014-11-13 (×4): 81 mg via ORAL
  Filled 2014-11-09 (×5): qty 1

## 2014-11-09 MED ORDER — DOCUSATE SODIUM 50 MG PO CAPS
50.0000 mg | ORAL_CAPSULE | Freq: Every day | ORAL | Status: DC | PRN
  Filled 2014-11-09: qty 1

## 2014-11-09 NOTE — ED Notes (Addendum)
Dr Tenny Crawross and josephone NP into see, Pt has not eaten and reports that she is not ready to eat yet, but will eat shortly

## 2014-11-09 NOTE — ED Notes (Signed)
Up to the bathroom 

## 2014-11-09 NOTE — ED Notes (Signed)
Pt ambulatory w/o difficulty to [facility] w/ [employer], belongings given to officers 
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
## 2014-11-09 NOTE — Progress Notes (Signed)
Inpatient Diabetes Program Recommendations  AACE/ADA: New Consensus Statement on Inpatient Glycemic Control (2013)  Target Ranges:  Prepandial:   less than 140 mg/dL      Peak postprandial:   less than 180 mg/dL (1-2 hours)      Critically ill patients:  140 - 180 mg/dL   Results for [name], [name] (MRN [id number]) as of 11/09/2014 09:01  Ref. Range 11/07/2014 14:34 11/08/2014 22:20 11/09/2014 08:05  Glucose-Capillary Latest Range: 70-99 mg/dL 517155 (H) 616265 (H) 073226 (H)    Diabetes history: DM2 Outpatient Diabetes medications: Lantus 51 units QHS Current orders for Inpatient glycemic control: Lantus 51 units QHS, Novolog 0-15 units TID with meals, Novolog 0-5 units QHS, Novolog 4 units TID with meals for meal coverage  Inpatient Diabetes Program Recommendations Insulin - Basal: Please consider increasing Lantus to 53 units QHS. Correction (SSI): Please consider increasing Novolog correction to Resistant scale.  Thanks, [name] PennerMarie Jaxtyn Linville, RN, MSN, CCRN, CDE Diabetes Coordinator Inpatient Diabetes Program [phone] (Team Pager) [phone] ([facility] office) [phone] [facility] office)

## 2014-11-09 NOTE — Tx Team (Signed)
Initial Interdisciplinary Treatment Plan   PATIENT STRESSORS: Health problems Medication change or noncompliance   PATIENT STRENGTHS: Ability for insight Average or above average intelligence   PROBLEM LIST: Problem List/Patient Goals Date to be addressed Date deferred Reason deferred Estimated date of resolution  depression 11/09/14                                                      DISCHARGE CRITERIA:  Improved stabilization in mood, thinking, and/or behavior Need for constant or close observation no longer present  PRELIMINARY DISCHARGE PLAN: Return to previous living arrangement  PATIENT/FAMIILY INVOLVEMENT: This treatment plan has been presented to and reviewed with the patient, [name], and/or family member.  The patient and family have been given the opportunity to ask questions and make suggestions.  Jacquelyne BalintForrest, Aaren Atallah Shanta 11/09/2014, 5:16 PM

## 2014-11-09 NOTE — ED Notes (Signed)
Pt did not eat lunch, sandwich given
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
## 2014-11-09 NOTE — Progress Notes (Signed)
Patient ID: [name], female   DOB: 23-Dec-1950, 64 y.o.   MRN: [id number]  64 year old black female [facility] that she was having some SI with plan to stop taking her medication. Pt reported that at time of admission that she was tired and that she was not going to take any medication. Pt reported that she was suicidal at time of admission, however was able to contract for safety. Pt reported that all she was going to say was that she was just tired, and that she was not going to answer any more questions. Pt reported being negative HI, and no AH/VH noted. This writer attempted to encourage patient to take her medications, patient reported that at this time she was going to refuse. Pt was not receptive to staff encouragement, staff will continue to encourage patient. Pt was very guarded and depressed at time of admission. Once on the unit pt did allow staff to check blood sugar it was 147, however refused to take medication.

## 2014-11-09 NOTE — BHH Counselor (Signed)
Fransisca KaufmannLaura Davis, NP recommends inpatient geriatric-psychiatry placement. The following facilities were contacted:  BED AVAILABLE, FAXED CLINICAL INFORMATION: [facility], per [facility], per [facility]  AT CAPACITY: [facility], per [facility], per [facility], per [facility], per [facility], per [facility]   [place]ata Guess Ellis Cerys Winget Jr, [facility], [facility] Triage Specialist [phone]

## 2014-11-09 NOTE — ED Notes (Signed)
[facility] here to transport 

## 2014-11-10 DIAGNOSIS — F332 Major depressive disorder, recurrent severe without psychotic features: Principal | ICD-10-CM

## 2014-11-10 LAB — GLUCOSE, CAPILLARY
GLUCOSE-CAPILLARY: 209 mg/dL — AB (ref 70–99)
Glucose-Capillary: 207 mg/dL — ABNORMAL HIGH (ref 70–99)
Glucose-Capillary: 192 mg/dL — ABNORMAL HIGH (ref 70–99)
Glucose-Capillary: 156 mg/dL — ABNORMAL HIGH (ref 70–99)

## 2014-11-10 MED ORDER — BUSPIRONE HCL 15 MG PO TABS
30.0000 mg | ORAL_TABLET | Freq: Every evening | ORAL | Status: DC
  Administered 2014-11-10: 30 mg via ORAL
  Filled 2014-11-10 (×2): qty 2

## 2014-11-10 MED ORDER — BENZONATATE 100 MG PO CAPS
100.0000 mg | ORAL_CAPSULE | Freq: Three times a day (TID) | ORAL | Status: DC | PRN
  Administered 2014-11-10: 100 mg via ORAL
  Filled 2014-11-10: qty 1

## 2014-11-10 MED ORDER — CLONAZEPAM 0.5 MG PO TABS
0.5000 mg | ORAL_TABLET | Freq: Three times a day (TID) | ORAL | Status: DC
  Administered 2014-11-10 – 2014-11-13 (×10): 0.5 mg via ORAL
  Filled 2014-11-10 (×10): qty 1

## 2014-11-10 MED ORDER — GABAPENTIN 400 MG PO CAPS
1600.0000 mg | ORAL_CAPSULE | Freq: Every evening | ORAL | Status: DC
  Administered 2014-11-10 – 2014-11-12 (×3): 1600 mg via ORAL
  Filled 2014-11-10 (×4): qty 4

## 2014-11-10 MED ORDER — GUAIFENESIN ER 600 MG PO TB12
600.0000 mg | ORAL_TABLET | Freq: Two times a day (BID) | ORAL | Status: DC
  Administered 2014-11-10 – 2014-11-13 (×7): 600 mg via ORAL
  Filled 2014-11-10 (×9): qty 1

## 2014-11-10 MED ORDER — BUSPIRONE HCL 15 MG PO TABS
15.0000 mg | ORAL_TABLET | ORAL | Status: DC
  Administered 2014-11-11: 15 mg via ORAL
  Filled 2014-11-10 (×2): qty 1

## 2014-11-10 MED ORDER — GABAPENTIN 400 MG PO CAPS
800.0000 mg | ORAL_CAPSULE | Freq: Every morning | ORAL | Status: DC
  Administered 2014-11-10 – 2014-11-13 (×4): 800 mg via ORAL
  Filled 2014-11-10 (×6): qty 2

## 2014-11-10 NOTE — BHH Suicide Risk Assessment (Signed)
   Nursing information obtained from:  Patient Demographic factors:  Age 65 or older Current Mental Status:  Suicidal ideation indicated by patient Loss Factors:  Decline in physical health Historical Factors:  Prior suicide attempts Risk Reduction Factors:  NA Total Time spent with patient: 45 minutes  CLINICAL FACTORS:   Severe Anxiety and/or Agitation Depression:   Anhedonia Hopelessness Impulsivity Insomnia Recent sense of peace/wellbeing Severe Chronic Pain Previous Psychiatric Diagnoses and Treatments Medical Diagnoses and Treatments/Surgeries  Psychiatric Specialty Exam: Physical Exam  ROS  Blood pressure 122/76, pulse 82, temperature 98 F (36.7 C), temperature source Oral, resp. rate 18, height 5\' 5"  (1.651 m), weight 91.627 kg (202 lb), SpO2 100 %.Body mass index is 33.61 kg/(m^2).  General Appearance: Guarded  Eye Contact::  Good  Speech:  Clear and Coherent and Slow  Volume:  Decreased  Mood:  Anxious, Depressed, Hopeless and Worthless  Affect:  Congruent and Depressed  Thought Process:  Coherent and Goal Directed  Orientation:  Full (Time, Place, and Person)  Thought Content:  Rumination  Suicidal Thoughts:  Yes.  with intent/plan  Homicidal Thoughts:  No  Memory:  Immediate;   Good Recent;   Good  Judgement:  Impaired  Insight:  Lacking  Psychomotor Activity:  Decreased  Concentration:  Fair  Recall:  Good  Fund of Knowledge:Good  Language: Good  Akathisia:  NA  Handed:  Right  AIMS (if indicated):     Assets:  Communication Skills Desire for Improvement Financial Resources/Insurance Housing Intimacy Leisure Time Resilience Social Support Talents/Skills Transportation  Sleep:  Number of Hours: 4.75   Musculoskeletal: Strength & Muscle Tone: within normal limits Gait & Station: normal Patient leans: N/A  COGNITIVE FEATURES THAT CONTRIBUTE TO RISK:  Closed-mindedness Loss of executive function Polarized thinking Thought constriction  (tunnel vision)    SUICIDE RISK:   Severe:  Frequent, intense, and enduring suicidal ideation, specific plan, no subjective intent, but some objective markers of intent (i.e., choice of lethal method), the method is accessible, some limited preparatory behavior, evidence of impaired self-control, severe dysphoria/symptomatology, multiple risk factors present, and few if any protective factors, particularly a lack of social support.  PLAN OF CARE: Admit for crisis stabilization, safety monitoring and medication management of depression with suicide ideation.   I certify that inpatient services furnished can reasonably be expected to improve the patient's condition.  [name],[name] R. 11/10/2014, 2:42 PM
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
## 2014-11-10 NOTE — BHH Group Notes (Signed)
BHH Group Notes:  (Nursing/MHT/Case Management/Adjunct)  Date:  11/10/2014  Time:  3:36 PM  Type of Therapy:  Psychoeducational Skills  Participation Level:  Active  Participation Quality:  Appropriate  Affect:  Appropriate  Cognitive:  Appropriate  Insight:  Appropriate  Engagement in Group:  Engaged  Modes of Intervention:  Discussion  Summary of Progress/Problems: Pt did attend self inventory group, pt reported that she was negative SI/HI, no AH/VH noted. Pt rated would not rate her depression or her  helplessness/hopelessness.     Pt reported no issues or concerns.   Jacquelyne BalintForrest, Imane Burrough Shanta 11/10/2014, 3:36 PM

## 2014-11-10 NOTE — H&P (Signed)
Psychiatric Admission Assessment Adult  Patient Identification:  [name] Date of Evaluation:  11/10/2014 Chief Complaint:  MDD,RECURRENT SEVERE   History of Present Illness:: 64 year old female admitted after reportedly stopping her medications in hopes that she would die as a result.  She states that her symptoms are likely a result of her faith not being as string as it should be. Reports that she is a [place] and has not ben as involved in church and activities within [employer] and expressing some concerns in regards to this.  Patient acknowledges that her husband is somewhat supportive of her, her psychiatric illnesses, and her medical illnesses. States that she is tired of being sick and administering insulin injections. States that financial concerns are also a burden and that aforementioned issues have contributed to her feeling like not being here anymore was best "at the time".  Spoke with patient for an hour, providing encouragement and supporting and discussed utilization of coping mechanisms. Good insight.  She denies SI, HI, or AVH.    Elements:  Location:  MDD, recurrent severe. Quality:  taking po medications and insulin. Severity:  severe. Timing:  acute. Duration:  persistent, improving. Context:  was debating discontinuing medications in hopes to die as a result . Associated Signs/Synptoms: Depression Symptoms:  depressed mood, feelings of worthlessness/guilt, (Hypo) Manic Symptoms:  NA Anxiety Symptoms:  Excessive Worry, Psychotic Symptoms:  NA PTSD Symptoms: NA Total Time spent with patient: 1 hour  Psychiatric Specialty Exam: Physical Exam  Review of Systems  Constitutional: Negative.   HENT: Negative.   Eyes: Negative.   Respiratory: Positive for cough.   Cardiovascular: Negative.   Gastrointestinal: Negative.   Genitourinary: Negative.   Musculoskeletal: Negative.   Skin: Negative.   Neurological: Negative.   Endo/Heme/Allergies: Negative.    Psychiatric/Behavioral: Positive for depression. The patient is nervous/anxious.     Blood pressure 127/69, pulse 76, temperature 98 F (36.7 C), temperature source Oral, resp. rate 18, height $RemoveBe'5\' 5"'XsCsHLYyZ$  (1.651 m), weight 91.627 kg (202 lb), SpO2 100 %.Body mass index is 33.61 kg/(m^2).  General Appearance: Well Groomed  Engineer, water::  Good  Speech:  Clear and Coherent and Normal Rate  Volume:  Normal  Mood:  Depressed  Affect:  Depressed  Thought Process:  Goal Directed  Orientation:  Full (Time, Place, and Person)  Thought Content:  Negative  Suicidal Thoughts:  No  Homicidal Thoughts:  No  Memory:  Immediate;   Good Recent;   Good  Judgement:  Good  Insight:  Good  Psychomotor Activity:  Normal  Concentration:  Good  Recall:  Good  Fund of Knowledge:Good  Language: Negative  Akathisia:  No  Handed:  Right  AIMS (if indicated):     Assets:  Communication Skills Desire for Improvement Housing Intimacy Social Support Transportation Vocational/Educational  Sleep:  Number of Hours: 4.75    Musculoskeletal: Strength & Muscle Tone: within normal limits Gait & Station: normal Patient leans: NA  Past Psychiatric History: Diagnosis:  Hospitalizations:  Outpatient Care:  Substance Abuse Care:  Self-Mutilation:  Suicidal Attempts:  Violent Behaviors:   Past Medical History:   Past Medical History  Diagnosis Date  . Hypertension   . Peripheral neuropathy   . Cervicalgia   . Low back pain syndrome   . Hyperlipidemia   . Vitamin D deficiency   . Vitamin B12 deficiency   . Iliotibial band syndrome   . Anemia   . Borderline personality disorder   . Nonorganic psychosis   .  GERD (gastroesophageal reflux disease)   . Diabetes mellitus   . Depression   . Diabetic gastroparesis   . CAD (coronary artery disease)     Catherization 11/18/09>nonobstructive CAD , normal LV function, EF 65%  . Health maintenance examination     Colonoscopy 2009> normal; Diabetic eye exam  12/2008. No diabetic retinopathy; Mammogram 11/10> No evidence of malignancy, DXA 03/14/13 : normal  . Headache(784.0)   . Arthritis   . SBO (small bowel obstruction) 01/09/2013  . Diastolic CHF     Grade I, on Echo 06/2013, EF 55-60%  . Sleep apnea     does not wear CPAP   . Diabetes mellitus, type II   . Wears glasses   . Wears dentures     upper  . Gout   . Right carpal tunnel syndrome 09/13/2014   Allergies:   Allergies  Allergen Reactions  . Cimetidine Other (See Comments)    Breast swelling  . Indomethacin Diarrhea    Caused severe diarrhea with episodes of incontinance  . Penicillins Hives and Itching    Other reaction(s): Delusions (intolerance)  . Indomethacin Other (See Comments)    Severe diarrhea  . Sulfonamide Derivatives     unknown   PTA Medications: Prescriptions prior to admission  Medication Sig Dispense Refill Last Dose  . ACCU-CHEK FASTCLIX LANCETS MISC Use to Check Blood Sugars up to 4 Times a Day. ICD-10 Code: E11.65 102 each 6 Unknown at Unknown time  . acetaminophen-codeine (TYLENOL #4) 300-60 MG per tablet take 1 tablet by mouth three times a day 90 tablet 1 Unknown at Unknown time  . aspirin 81 MG chewable tablet Chew 81 mg by mouth daily.   Unknown at Unknown time  . buPROPion (WELLBUTRIN XL) 150 MG 24 hr tablet Take 3 tablets (450 mg total) by mouth every morning. 90 tablet 5 Unknown at Unknown time  . busPIRone (BUSPAR) 10 MG tablet Take 10-20 mg by mouth 2 (two) times daily. Take 15 mg every morning and30 mg every evening   Unknown at Unknown time  . cloNIDine (CATAPRES - DOSED IN MG/24 HR) 0.3 mg/24hr patch Place 1 patch (0.3 mg total) onto the skin every 7 (seven) days. 4 patch 11 Unknown at Unknown time  . dexlansoprazole (DEXILANT) 60 MG capsule Take 60 mg by mouth daily.    Unknown at Unknown time  . diazepam (VALIUM) 5 MG tablet Take 1 tablet (5 mg total) by mouth once. Take prior to injection 2 tablet 0 Unknown at Unknown time  . Docusate  Sodium (COLACE PO) Take 2 tablets by mouth daily as needed (for constipation).    Unknown at Unknown time  . furosemide (LASIX) 40 MG tablet Take 1 tablet (40 mg total) by mouth every morning. 30 tablet 3 Unknown at Unknown time  . gabapentin (NEURONTIN) 800 MG tablet Take 1 tablet (800 mg total) by mouth 2 (two) times daily. (Patient taking differently: Take 800-1,600 mg by mouth 2 (two) times daily. $RemoveBefo'800mg'hOgYgJVUDdy$  in the morning $RemoveBef'1600mg'bpeHhYAdiJ$  at bedtime) 60 tablet 2 Unknown at Unknown time  . glucose blood (ACCU-CHEK AVIVA PLUS) test strip Check blood sugar up to 4 times a day as instructed 125 each 5 Unknown at Unknown time  . insulin aspart (NOVOLOG FLEXPEN) 100 UNIT/ML FlexPen inject with meals and snacks up to 8 times a day as directed by your aviva glucometer. (Patient taking differently: Inject into the skin. inject with meals and snacks up to 8 times a day as directed by  your aviva glucometer. Sliding scale) 30 mL 11 Unknown at Unknown time  . Insulin Glargine (LANTUS SOLOSTAR) 100 UNIT/ML Solostar Pen Inject 51 Units into the skin at bedtime. 15 mL 11 Unknown at Unknown time  . Insulin Pen Needle (BD PEN NEEDLE NANO U/F) 32G X 4 MM MISC USE TO TEST UP TO 5 TIMES A DAY ACCORDING TO CBG 100 each 4 Unknown at Unknown time  . isosorbide mononitrate (IMDUR) 60 MG 24 hr tablet Take 1 tablet (60 mg total) by mouth every morning. 30 tablet 6 Unknown at Unknown time  . Lancets Misc. (ACCU-CHEK FASTCLIX LANCET) KIT Use to check blood sugars up to 5 times a day 1 kit 2 Unknown at Unknown time  . LORazepam (ATIVAN) 0.5 MG tablet Take 1 tablet (0.5 mg total) by mouth every 8 (eight) hours. 30 tablet 0 Unknown at Unknown time  . losartan (COZAAR) 100 MG tablet Take 50 mg by mouth every morning.   Unknown at Unknown time  . metoprolol succinate (TOPROL-XL) 25 MG 24 hr tablet Take 1 tablet (25 mg total) by mouth every morning. 30 tablet 6 Unknown at Unknown time  . ondansetron (ZOFRAN) 4 MG tablet Take 4 mg by mouth every 8  (eight) hours as needed for nausea.    Unknown at Unknown time  . rosuvastatin (CRESTOR) 10 MG tablet Take 1 tablet (10 mg total) by mouth at bedtime. 30 tablet 11 Unknown at Unknown time  . senna (SENOKOT) 8.6 MG TABS Take 4 tablets by mouth daily as needed (for constipation).    Unknown at Unknown time  . sennosides-docusate sodium (SENOKOT-S) 8.6-50 MG tablet Take 2 tablets by mouth daily. 30 tablet 1 Unknown at Unknown time  . Simethicone (GAS-X PO) Take 2 tablets by mouth as needed (gas).   Unknown at Unknown time  . tiZANidine (ZANAFLEX) 2 MG tablet Take 1 tablet (2 mg total) by mouth every 8 (eight) hours as needed for muscle spasms. 90 tablet 1 Unknown at Unknown time                       Substance Abuse History in the last 12 months:  No.  Consequences of Substance Abuse: NA  Social History:  reports that she quit smoking about 12 years ago. Her smoking use included Cigarettes. She has never used smokeless tobacco. She reports that she does not drink alcohol or use illicit drugs. Additional Social History: Pain Medications: none noted Prescriptions: none noted Over the Counter: none noted History of alcohol / drug use?: No history of alcohol / drug abuse                    Current Place of Residence: [facility] with husband   Marital Status:  Married Education:  [employer] Graduate Educational Problems/Performance: Religious Beliefs/Practices: Darrick Meigs; attends Regulatory affairs officer History:  NG  Family History:   Family History  Problem Relation Age of Onset  . Diabetes Mother   . Hypertension Mother   . Hyperlipidemia Mother   . Arthritis Mother   . Depression Mother   . Heart disease Father   . Diabetes Sister   . Diabetes Brother   . Heart disease Brother   . Heart disease Paternal Grandmother   . Seizures Brother     Results for orders placed or performed during the hospital encounter of 11/09/14 (from the past 72 hour(s))  Glucose,  capillary     Status: Abnormal  Collection Time: 11/09/14  5:07 PM  Result Value Ref Range   Glucose-Capillary 147 (H) 70 - 99 mg/dL   Comment 1 Notify RN   Glucose, capillary     Status: Abnormal   Collection Time: 11/09/14 10:13 PM  Result Value Ref Range   Glucose-Capillary 268 (H) 70 - 99 mg/dL   Comment 1 Notify RN   Glucose, capillary     Status: Abnormal   Collection Time: 11/10/14  6:10 AM  Result Value Ref Range   Glucose-Capillary 209 (H) 70 - 99 mg/dL   Comment 1 Notify RN    Psychological Evaluations:  Assessment:   DSM5:  Schizophrenia Disorders:  NA Obsessive-Compulsive Disorders:  NA Trauma-Stressor Disorders:  NA Substance/Addictive Disorders:  NA Depressive Disorders:  Major Depressive Disorder - Severe (296.23)  AXIS I:  Major Depression, Recurrent severe AXIS II:  Deferred AXIS III:   Past Medical History  Diagnosis Date  . Hypertension   . Peripheral neuropathy   . Cervicalgia   . Low back pain syndrome   . Hyperlipidemia   . Vitamin D deficiency   . Vitamin B12 deficiency   . Iliotibial band syndrome   . Anemia   . Borderline personality disorder   . Nonorganic psychosis   . GERD (gastroesophageal reflux disease)   . Diabetes mellitus   . Depression   . Diabetic gastroparesis   . CAD (coronary artery disease)     Catherization 11/18/09>nonobstructive CAD , normal LV function, EF 65%  . Health maintenance examination     Colonoscopy 2009> normal; Diabetic eye exam 12/2008. No diabetic retinopathy; Mammogram 11/10> No evidence of malignancy, DXA 03/14/13 : normal  . Headache(784.0)   . Arthritis   . SBO (small bowel obstruction) 01/09/2013  . Diastolic CHF     Grade I, on Echo 06/2013, EF 55-60%  . Sleep apnea     does not wear CPAP   . Diabetes mellitus, type II   . Wears glasses   . Wears dentures     upper  . Gout   . Right carpal tunnel syndrome 09/13/2014   AXIS IV:  other psychosocial or environmental problems and problems related  to social environment AXIS V:  41-50 serious symptoms  Treatment Plan/Recommendations:  Daily contact and medication management Increase Buspirone and gabapentin D/c'd Ativan and begin Klonopin  Consider adding additional anti-depressant   Treatment Plan Summary: Daily contact with patient to assess and evaluate symptoms and progress in treatment Medication management Current Medications:  Current Facility-Administered Medications  Medication Dose Route Frequency Provider Last Rate Last Dose  . acetaminophen (TYLENOL) tablet 650 mg  650 mg Oral Q6H PRN Delfin Gant, NP      . alum & mag hydroxide-simeth (MAALOX/MYLANTA) 200-200-20 MG/5ML suspension 30 mL  30 mL Oral Q4H PRN Delfin Gant, NP      . aspirin chewable tablet 81 mg  81 mg Oral Daily Delfin Gant, NP   81 mg at 11/10/14 0811  . buPROPion (WELLBUTRIN XL) 24 hr tablet 450 mg  450 mg Oral q morning - 10a Delfin Gant, NP   450 mg at 11/10/14 0811  . [START ON 11/11/2014] busPIRone (BUSPAR) tablet 15 mg  15 mg Oral BH-q7a Evanna Cori Burkett, NP      . busPIRone (BUSPAR) tablet 30 mg  30 mg Oral QPM Evanna Cori Burkett, NP      . clonazePAM (KLONOPIN) tablet 0.5 mg  0.5 mg Oral TID Evanna Glenda Chroman, NP      . [  START ON 11/15/2014] cloNIDine (CATAPRES - Dosed in mg/24 hr) patch 0.3 mg  0.3 mg Transdermal Q7 days Delfin Gant, NP      . docusate sodium (COLACE) capsule 50 mg  50 mg Oral Daily PRN Delfin Gant, NP      . furosemide (LASIX) tablet 40 mg  40 mg Oral q morning - 10a Delfin Gant, NP   40 mg at 11/10/14 0811  . gabapentin (NEURONTIN) capsule 800 mg  800 mg Oral BID Delfin Gant, NP   800 mg at 11/10/14 9563  . gabapentin (NEURONTIN) tablet 1,600 mg  1,600 mg Oral QPM Evanna Cori Burkett, NP      . insulin aspart (novoLOG) injection 0-15 Units  0-15 Units Subcutaneous TID WC Delfin Gant, NP   5 Units at 11/10/14 0629  . insulin aspart (novoLOG) injection 0-5 Units   0-5 Units Subcutaneous QHS Delfin Gant, NP   3 Units at 11/09/14 2247  . insulin aspart (novoLOG) injection 4 Units  4 Units Subcutaneous TID WC Delfin Gant, NP   4 Units at 11/10/14 0700  . insulin glargine (LANTUS) injection 51 Units  51 Units Subcutaneous QHS Delfin Gant, NP   51 Units at 11/09/14 2247  . isosorbide mononitrate (IMDUR) 24 hr tablet 60 mg  60 mg Oral q morning - 10a Delfin Gant, NP   60 mg at 11/10/14 8756  . losartan (COZAAR) tablet 50 mg  50 mg Oral Daily Delfin Gant, NP   50 mg at 11/10/14 4332  . magnesium hydroxide (MILK OF MAGNESIA) suspension 30 mL  30 mL Oral Daily PRN Delfin Gant, NP      . metoprolol succinate (TOPROL-XL) 24 hr tablet 25 mg  25 mg Oral q morning - 10a Delfin Gant, NP   25 mg at 11/10/14 0813  . ondansetron (ZOFRAN) tablet 4 mg  4 mg Oral Q8H PRN Delfin Gant, NP      . pantoprazole (PROTONIX) EC tablet 40 mg  40 mg Oral Daily Delfin Gant, NP   40 mg at 11/10/14 0814  . rosuvastatin (CRESTOR) tablet 10 mg  10 mg Oral QHS Delfin Gant, NP   10 mg at 11/09/14 2200  . senna (SENOKOT) tablet 34.4 mg  4 tablet Oral Daily PRN Delfin Gant, NP        Observation Level/Precautions:  15 minute checks  Laboratory:  no new orders  Psychotherapy: continue group sessions    Medications: see plan  Consultations:  none  Discharge Concerns: safety   Estimated LOS:5-7 days  Other:     I certify that inpatient services furnished can reasonably be expected to improve the patient's condition.    Gypsy Lore CORI 1/17/20168:52 AM  Patient seen face to face for the psychiatric evaluation, case discussed with physician extender and formulated treatment plan. Reviewed the information documented and agree with the treatment plan.  Malynn Lucy,JANARDHAHA R. 11/20/2014 10:55 AM

## 2014-11-10 NOTE — Progress Notes (Addendum)
Pt went to group this am. She rates her depression and anxiety 8/10. She wants to be able to feel better. Pt is soft spoken and speaks when spoken to. She was given a tessalin pearl for her cough which she stated started last pm. No production. Pt denies Si and HI and contracts for safety. Pts affect remains depressed and blunted. Pt continues to cough after given tessalin perals. NPmade aware. Pt has scattered wheezes throughout but does not appear short of breath. Pt has a nonproductive cough. 2pm-NP evaluated pt. Pt was given three pillows to elevate her head. Sat 97% temp. 98.2po. Pt was given a pitcher of diet sprite. Will continue to monitor.

## 2014-11-10 NOTE — [facility] Group Notes (Signed)
[facility] LCSW Group Therapy  11/10/2014 1:15 PM   Type of Therapy:  Group Therapy  Participation Level:  Did Not Attend  Tomie Spizzirri Horton, LCSW 11/10/2014 2:26 PM    

## 2014-11-11 ENCOUNTER — Other Ambulatory Visit (HOSPITAL_COMMUNITY): Payer: 59

## 2014-11-11 DIAGNOSIS — F332 Major depressive disorder, recurrent severe without psychotic features: Secondary | ICD-10-CM | POA: Diagnosis present

## 2014-11-11 LAB — GLUCOSE, CAPILLARY
GLUCOSE-CAPILLARY: 159 mg/dL — AB (ref 70–99)
GLUCOSE-CAPILLARY: 237 mg/dL — AB (ref 70–99)
Glucose-Capillary: 249 mg/dL — ABNORMAL HIGH (ref 70–99)

## 2014-11-11 MED ORDER — INSULIN GLARGINE 100 UNIT/ML ~~LOC~~ SOLN
53.0000 [IU] | Freq: Every day | SUBCUTANEOUS | Status: DC
  Administered 2014-11-11 – 2014-11-12 (×2): 53 [IU] via SUBCUTANEOUS

## 2014-11-11 MED ORDER — PHENOL 1.4 % MT LIQD
1.0000 | OROMUCOSAL | Status: DC | PRN
  Filled 2014-11-11: qty 177

## 2014-11-11 MED ORDER — BUSPIRONE HCL 10 MG PO TABS
20.0000 mg | ORAL_TABLET | ORAL | Status: DC
  Administered 2014-11-11 – 2014-11-13 (×6): 20 mg via ORAL
  Filled 2014-11-11: qty 2
  Filled 2014-11-11: qty 18
  Filled 2014-11-11: qty 2
  Filled 2014-11-11 (×2): qty 18
  Filled 2014-11-11 (×2): qty 2
  Filled 2014-11-11: qty 4
  Filled 2014-11-11: qty 2
  Filled 2014-11-11: qty 18
  Filled 2014-11-11 (×3): qty 2
  Filled 2014-11-11: qty 4
  Filled 2014-11-11: qty 2

## 2014-11-11 MED ORDER — LORATADINE 10 MG PO TABS
10.0000 mg | ORAL_TABLET | Freq: Every day | ORAL | Status: DC
  Administered 2014-11-11 – 2014-11-13 (×3): 10 mg via ORAL
  Filled 2014-11-11 (×4): qty 1

## 2014-11-11 MED ORDER — TRAZODONE HCL 50 MG PO TABS
50.0000 mg | ORAL_TABLET | Freq: Every day | ORAL | Status: DC
  Administered 2014-11-11 – 2014-11-12 (×2): 50 mg via ORAL
  Filled 2014-11-11 (×3): qty 1

## 2014-11-11 NOTE — [facility] Group Notes (Signed)
[facility] LCSW Group Therapy  11/11/2014 3:40 PM  Type of Therapy:  Group Therapy  Participation Level:  Active  Participation Quality:  Attentive  Affect:  Depressed and Tearful  Cognitive:  Alert and Oriented  Insight:  Improving  Engagement in Therapy:  Engaged  Modes of Intervention:  Confrontation, Discussion, Education, Exploration, Problem-solving, Rapport Building, Socialization and Support  Summary of Progress/Problems: Today's Topic: Overcoming Obstacles. Pt identified obstacles faced currently and processed barriers involved in overcoming these obstacles. Pt identified steps necessary for overcoming these obstacles and explored motivation (internal and external) for facing these difficulties head on. Pt further identified one area of concern in their lives and chose a skill of focus pulled from their "toolbox." Xochilt shared that her biggest obstacle involves "figuring out why I'm so hopeless and angry." Jakaylah shared that although her medical illnesses are not life threatening, she feels a loss of control and loss of power over her body "because new illnesses just keep popping up." She talked about how her husband gets the brunt of her anger and responds with indifference, making her more upset. Fulton Molelice shows progress in the group setting and improving insight AEB her ability to process the need for her to grieve the loss of some physical abilities and find new things to do that give her a sense of purpose. She was open to suggestions from other group members and was open to sharing in the group setting.    Smart, Kenley Rettinger LCSWA  11/11/2014, 3:40 PM

## 2014-11-11 NOTE — [facility] Group Notes (Signed)
[facility]   11/11/2014 10:15 AM  Participation Quality:  Appropriate   Mood/Affect:  Appropriate  Depression Rating:  9  Anxiety Rating:  9  Thoughts of Suicide:  No Will you contract for safety?   NA  Current AVH:  No  Plan for Discharge/Comments:  Pt plans to return home with her husband at d/c but "I haven't allowed him to see me yet." Pt resistant to talking more about marital issues. [facility] and would like to be linked with another therapist. Pt reports that she is disabled. No SI/HI/AVH reported.   Transportation Means: unknown at this time.   Supports: none identified at this time.   Smart, American FinancialHeather LCSWA

## 2014-11-11 NOTE — Progress Notes (Signed)
[facility] MD Progress Note  11/11/2014 12:34 PM [name]  MRN:  [id number] Subjective:Patient states "I am feeling better ,I came here because of some problems with my husband and I mentioned to my therapist that I wanted to die and would stop my medications ,but I did not.' Objective; Patient seen and chart reviewed.Patient discussed with treatment team. Patient reports situations at home as a stressor. Patient reports marital discord which is the main trigger for her depression. Patient reports feeling better and reports medications as working. Patient does report sleep issues ,would like to try a sleep aid. Patient denies SI/HI/AH/VH today. Patient per staff has been compliant on medications after being here. Patient does complaint of a cough. Patient however reports that this is improving. She denies any fever ,chills, SOB. Patient on Guafenacin 600 mg po bid which seems to help. Patient reports cough as productive. Patient attending group activities. Denies any side effects of medications. Would like to pursue psychotherapy once discharged.   Diagnosis:   DSM5: Depressive Disorders:  Major Depressive Disorder - Severe (296.23),with out psychosis    Total Time spent with patient: 45 minutes   ADL's:  Intact  Sleep: Poor  Appetite:  Fair   Psychiatric Specialty Exam: Physical Exam  Review of Systems  Constitutional: Negative.   Eyes: Negative.   Respiratory: Positive for cough and sputum production. Negative for shortness of breath and wheezing.   Cardiovascular: Negative.   Gastrointestinal: Negative.   Genitourinary: Negative.   Musculoskeletal: Negative.   Skin: Negative.   Neurological: Negative.   Endo/Heme/Allergies: Negative.   Psychiatric/Behavioral: Positive for depression. The patient has insomnia.     Blood pressure 134/64, pulse 89, temperature 98.2 F (36.8 C), temperature source Oral, resp. rate 18, height  (1.651 m), weight 91.627 kg (202 lb), SpO2 100  %.Body mass index is 33.61 kg/(m^2).  General Appearance: Fairly Groomed  Patent attorney::  Fair  Speech:  Clear and Coherent  Volume:  Normal  Mood:  Depressed  Affect:  Tearful  Thought Process:  Coherent  Orientation:  Full (Time, Place, and Person)  Thought Content:  WDL  Suicidal Thoughts:  No  Homicidal Thoughts:  No  Memory:  Immediate;   Fair Recent;   Fair Remote;   Fair  Judgement:  Fair  Insight:  Fair  Psychomotor Activity:  Normal  Concentration:  Fair  Recall:  [employer] of Knowledge:Fair  Language: Fair  Akathisia:  No  Handed:  Right  AIMS (if indicated):     Assets:  Communication Skills Desire for Improvement  Sleep:  Number of Hours: 4.75   Musculoskeletal: Strength & Muscle Tone: within normal limits Gait & Station: normal Patient leans: N/A  Current Medications: Current Facility-Administered Medications  Medication Dose Route Frequency Provider Last Rate Last Dose  . acetaminophen (TYLENOL) tablet 650 mg  650 mg Oral Q6H PRN Earney Navy, NP   650 mg at 11/10/14 1249  . alum & mag hydroxide-simeth (MAALOX/MYLANTA) 200-200-20 MG/5ML suspension 30 mL  30 mL Oral Q4H PRN Earney Navy, NP      . aspirin chewable tablet 81 mg  81 mg Oral Daily Earney Navy, NP   81 mg at 11/11/14 0852  . buPROPion (WELLBUTRIN XL) 24 hr tablet 450 mg  450 mg Oral q morning - 10a Earney Navy, NP   450 mg at 11/11/14 0853  . busPIRone (BUSPAR) tablet 20 mg  20 mg Oral [id number] Jomarie Longs, MD      .  clonazePAM (KLONOPIN) tablet 0.5 mg  0.5 mg Oral TID Audrea MuscatEvanna Cori Burkett, NP   0.5 mg at 11/11/14 0852  . [START ON 11/15/2014] cloNIDine (CATAPRES - Dosed in mg/24 hr) patch 0.3 mg  0.3 mg Transdermal Q7 days Earney NavyJosephine C Onuoha, NP      . docusate sodium (COLACE) capsule 50 mg  50 mg Oral Daily PRN Earney NavyJosephine C Onuoha, NP      . furosemide (LASIX) tablet 40 mg  40 mg Oral q morning - 10a Earney NavyJosephine C Onuoha, NP   40 mg at 11/11/14 0852  . gabapentin  (NEURONTIN) capsule 1,600 mg  1,600 mg Oral QPM Evanna Cori Merry ProudBurkett, NP   1,600 mg at 11/10/14 1723  . gabapentin (NEURONTIN) capsule 800 mg  800 mg Oral q morning - 10a Evanna Janann Augustori Burkett, NP   800 mg at 11/11/14 0852  . guaiFENesin (MUCINEX) 12 hr tablet 600 mg  600 mg Oral BID Sanjuana KavaAgnes I Nwoko, NP   600 mg at 11/11/14 0853  . insulin aspart (novoLOG) injection 0-15 Units  0-15 Units Subcutaneous TID WC Earney NavyJosephine C Onuoha, NP   3 Units at 11/11/14 [phone]  . insulin aspart (novoLOG) injection 0-5 Units  0-5 Units Subcutaneous QHS Earney NavyJosephine C Onuoha, NP   3 Units at 11/09/14 2247  . insulin aspart (novoLOG) injection 4 Units  4 Units Subcutaneous TID WC Earney NavyJosephine C Onuoha, NP   4 Units at 11/11/14 0654  . insulin glargine (LANTUS) injection 53 Units  53 Units Subcutaneous QHS Theta Leaf, MD      . isosorbide mononitrate (IMDUR) 24 hr tablet 60 mg  60 mg Oral q morning - 10a Earney NavyJosephine C Onuoha, NP   60 mg at 11/11/14 0853  . loratadine (CLARITIN) tablet 10 mg  10 mg Oral Daily Chilton Sallade, MD      . losartan (COZAAR) tablet 50 mg  50 mg Oral Daily Earney NavyJosephine C Onuoha, NP   50 mg at 11/11/14 0852  . magnesium hydroxide (MILK OF MAGNESIA) suspension 30 mL  30 mL Oral Daily PRN Earney NavyJosephine C Onuoha, NP      . metoprolol succinate (TOPROL-XL) 24 hr tablet 25 mg  25 mg Oral q morning - 10a Earney NavyJosephine C Onuoha, NP   25 mg at 11/11/14 0851  . ondansetron (ZOFRAN) tablet 4 mg  4 mg Oral Q8H PRN Earney NavyJosephine C Onuoha, NP      . pantoprazole (PROTONIX) EC tablet 40 mg  40 mg Oral Daily Earney NavyJosephine C Onuoha, NP   40 mg at 11/11/14 0851  . phenol (CHLORASEPTIC) mouth spray 1 spray  1 spray Mouth/Throat PRN Merrianne Mccumbers, MD      . rosuvastatin (CRESTOR) tablet 10 mg  10 mg Oral QHS Earney NavyJosephine C Onuoha, NP   10 mg at 11/09/14 2200  . senna (SENOKOT) tablet 34.4 mg  4 tablet Oral Daily PRN Earney NavyJosephine C Onuoha, NP      . traZODone (DESYREL) tablet 50 mg  50 mg Oral QHS Jomarie LongsSaramma Legion Discher, MD        Lab Results:  Results  for orders placed or performed during the hospital encounter of 11/09/14 (from the past 48 hour(s))  Glucose, capillary     Status: Abnormal   Collection Time: 11/09/14  5:07 PM  Result Value Ref Range   Glucose-Capillary 147 (H) 70 - 99 mg/dL   Comment 1 Notify RN   Glucose, capillary     Status: Abnormal   Collection Time: 11/09/14 10:13 PM  Result Value  Ref Range   Glucose-Capillary 268 (H) 70 - 99 mg/dL   Comment 1 Notify RN   Glucose, capillary     Status: Abnormal   Collection Time: 11/10/14  6:10 AM  Result Value Ref Range   Glucose-Capillary 209 (H) 70 - 99 mg/dL   Comment 1 Notify RN   Glucose, capillary     Status: Abnormal   Collection Time: 11/10/14 11:44 AM  Result Value Ref Range   Glucose-Capillary 207 (H) 70 - 99 mg/dL  Glucose, capillary     Status: Abnormal   Collection Time: 11/10/14  5:10 PM  Result Value Ref Range   Glucose-Capillary 192 (H) 70 - 99 mg/dL  Glucose, capillary     Status: Abnormal   Collection Time: 11/10/14  9:18 PM  Result Value Ref Range   Glucose-Capillary 156 (H) 70 - 99 mg/dL  Glucose, capillary     Status: Abnormal   Collection Time: 11/11/14  5:58 AM  Result Value Ref Range   Glucose-Capillary 159 (H) 70 - 99 mg/dL    Physical Findings: AIMS: Facial and Oral Movements Muscles of Facial Expression: None, normal Lips and Perioral Area: None, normal Jaw: None, normal Tongue: None, normal,Extremity Movements Upper (arms, wrists, hands, fingers): None, normal Lower (legs, knees, ankles, toes): None, normal, Trunk Movements Neck, shoulders, hips: None, normal, Overall Severity Severity of abnormal movements (highest score from questions above): None, normal Incapacitation due to abnormal movements: None, normal Patient's awareness of abnormal movements (rate only patient's report): No Awareness, Dental Status Current problems with teeth and/or dentures?: No Does patient usually wear dentures?: No  CIWA:    COWS:     Treatment  Plan Summary: Daily contact with patient to assess and evaluate symptoms and progress in treatment Medication management  Assessment: Patient presented with worsening depression as well as SI. Patient is improving on current treatment. Would like to pursue CBT on discharge.   Plan: Will continue Wellbutrin XL 450 mg daily for depression. Change Buspar to 20 mg po tid for anxiety sx. Add Trazodone 50 mg po qhs for sleep. Discussed sleep restriction. Klonopin  0.5 mg as scheduled. Discussed risks,side effects of BZD, discussed alternative treatment as well as the need to taper off klonopin . Patient voices understanding. Continue Mucinex for cough sx, pt reports improvement. Add Phenol spray as well as Loratidine 10 mg daily. CSW will work on disposition Patient to participate in therapeutic milieu.   Medical Decision Making Problem Points:  Established problem, stable/improving (1), Review of last therapy session (1) and Review of psycho-social stressors (1) Data Points:  Review or order medicine tests (1) Review and summation of old records (2) Review of medication regiment & side effects (2) Review of new medications or change in dosage (2)  I certify that inpatient services furnished can reasonably be expected to improve the patient's condition.   Kenwood Rosiak MD 11/11/2014, 12:34 PM

## 2014-11-11 NOTE — Progress Notes (Signed)
Inpatient Diabetes Program Recommendations  AACE/ADA: New Consensus Statement on Inpatient Glycemic Control (2013)  Target Ranges:  Prepandial:   less than 140 mg/dL      Peak postprandial:   less than 180 mg/dL (1-2 hours)      Critically ill patients:  140 - 180 mg/dL   Reason for Visit: Hyperglycemia  Results for [name], [name] (MRN [id number]) as of 11/11/2014 16:33  Ref. Range 11/10/2014 06:10 11/10/2014 11:44 11/10/2014 17:10 11/10/2014 21:18 11/11/2014 05:58  Glucose-Capillary Latest Range: 70-99 mg/dL 409209 (H) 811207 (H) 914192 (H) 156 (H) 159 (H)   Post-prandial blood sugars elevated. Increase meal coverage insulin.  Recommendations: Increase Novolog to 6 units tidwc.  Will continue to follow. Thank you. Ailene Ardshonda Jonathan Kirkendoll, RD, LDN, CDE Inpatient Diabetes Coordinator [phone]        I

## 2014-11-11 NOTE — Tx Team (Signed)
Interdisciplinary Treatment Plan Update (Adult)   Date: 11/11/2014   Time Reviewed: 10:23 AM  Progress in Treatment:  Attending groups: Yes  Participating in groups: yes    Taking medication as prescribed: Yes  Tolerating medication: Yes  Family/Significant othe contact made: Not yet. SPE required for this pt.   Patient understands diagnosis: Yes, AEB seeking treatment for SI with plan to stop taking medical meds, mood instability/depression, and for medication stabilization.  Discussing patient identified problems/goals with staff: Yes  Medical problems stabilized or resolved: Yes  Denies suicidal/homicidal ideation: Yes during group/self report.  Patient has not harmed self or Others: Yes  New problem(s) identified:  Discharge Plan or Barriers: Pt reports that she plans to return home and [facility] with Dr. Donell BeersPlovsky. Pt saw [facility] for therapy but stated that she no longer works there and needs to be referred to new therapist. CSW assessing.  Additional comments: 64 year old female admitted after reportedly stopping her medications in hopes that she would die as a result. She states that her symptoms are likely a result of her faith not being as string as it should be. Reports that she is a [place] and has not ben as involved in church and activities within [employer] and expressing some concerns in regards to this. Patient acknowledges that her husband is somewhat supportive of her, her psychiatric illnesses, and her medical illnesses. States that she is tired of being sick and administering insulin injections. States that financial concerns are also a burden and that aforementioned issues have contributed to her feeling like not being here anymore was best "at the time". Spoke with patient for an hour, providing encouragement and supporting and discussed utilization of coping mechanisms. Good insight. She denies SI, HI, or AVH.  Reason for Continuation of  Hospitalization: Depression/mood instability Medication stabilization  Estimated length of stay: 2-4 days  For review of initial/current patient goals, please see plan of care.  Attendees:  Patient:    Family:    Physician: Dr. Elna BreslowEappen, MD 11/11/2014 10:23 AM   Nursing: Rodell PernaPatrice RN 11/11/2014 10:23 AM   Clinical Social Worker Jeanita Carneiro Smart, LCSWA  11/11/2014 10:23 AM   Other:    Other:    Other:    Other:    Scribe for Treatment Team:  Trula SladeHeather Smart LCSWA 11/11/2014 10:23 AM

## 2014-11-11 NOTE — Progress Notes (Signed)
Patient ID: [name], female   DOB: 01/23/1951, 64 y.o.   MRN: [id number]   D: Pt has been appropriate on the unit today, she has attended all groups and engaged in treatment. Pt reported that she still hates taking her medication, but knows that she has too. Pt reported that her cough was getting better, but that she hates that she started feeling bad once [facility]. Pt reported her depression as a 9, and her helplessness/hopelessness as a 8. Pt reported being negative SI/HI, no AH/VH noted. A: 15 min checks continued for patient safety. R: Pt safety maintained.

## 2014-11-11 NOTE — Progress Notes (Signed)
Pt alert and cooperative. Affect/mood anxious, depressed and hopeless. Pt  attended group. -SI/HI, -A/[place], verbally contracts for safety.  C/o cough and back pain, see Mar. Minimum interaction with peers. Emotional support and encouragement given. Will continue to monitor closely and evaluate for stabilization.

## 2014-11-12 ENCOUNTER — Other Ambulatory Visit (HOSPITAL_COMMUNITY): Payer: 59

## 2014-11-12 DIAGNOSIS — R05 Cough: Secondary | ICD-10-CM

## 2014-11-12 LAB — GLUCOSE, CAPILLARY
GLUCOSE-CAPILLARY: 216 mg/dL — AB (ref 70–99)
Glucose-Capillary: 200 mg/dL — ABNORMAL HIGH (ref 70–99)
Glucose-Capillary: 171 mg/dL — ABNORMAL HIGH (ref 70–99)
Glucose-Capillary: 181 mg/dL — ABNORMAL HIGH (ref 70–99)
Glucose-Capillary: 215 mg/dL — ABNORMAL HIGH (ref 70–99)

## 2014-11-12 MED ORDER — INSULIN ASPART 100 UNIT/ML ~~LOC~~ SOLN
6.0000 [IU] | Freq: Three times a day (TID) | SUBCUTANEOUS | Status: DC
  Administered 2014-11-12 – 2014-11-13 (×2): 6 [IU] via SUBCUTANEOUS

## 2014-11-12 MED ORDER — OLANZAPINE 5 MG PO TBDP
ORAL_TABLET | ORAL | Status: AC
  Filled 2014-11-12: qty 1

## 2014-11-12 MED ORDER — ALBUTEROL SULFATE (2.5 MG/3ML) 0.083% IN NEBU
2.5000 mg | INHALATION_SOLUTION | RESPIRATORY_TRACT | Status: DC | PRN
  Administered 2014-11-13: 2.5 mg via RESPIRATORY_TRACT
  Filled 2014-11-12 (×2): qty 3

## 2014-11-12 NOTE — [facility] Group Notes (Signed)
The focus of this group is to educate the patient on the purpose and policies of crisis stabilization and provide a format to answer questions about their admission.  The group details unit policies and expectations of patients while admitted.  Patient attended 0900 nurse education orientation group this morning.  Patient actively participated, appropriate affect, alert, appropriate insight and engagement.  Today patient will work on 3 goals for discharge.  

## 2014-11-12 NOTE — Progress Notes (Signed)
Pt attended spiritual care group on grief and loss facilitated by chaplain Burnis KingfisherMatthew Stalnaker and counseling intern [place] Cris Gibby. Group opened with brief discussion and psycho-social ed around grief and loss in relationships and in relation to self - identifying life patterns, circumstances, changes that cause losses. Established group norm of speaking from own life experience. Group goal of establishing open and affirming space for members to share loss and experience with grief, normalize grief experience and provide psycho social education and grief support. Group drew on narrative and Alderian therapeutic modalities.   [name] identified with discussion regarding grieving the loss of a father figure, and shared about meeting her father later in life. One group member shared about loosing someone to suicide, and [name] asked how God views suicide. Expressed concerns because she had not had suidicial thoughts for many years and questioned what caused them now. [name] identified a loss of faith and church community as one contributing factor.   [place] Athenia Rys Counseling Intern

## 2014-11-12 NOTE — Progress Notes (Signed)
   D: Pt was observed in the dayroom interacting with her husband. Pt informed the writer that her husband says he's waiting for her to come home. Pt smiled continuously during the assessment. Informed the writer that she feels better today than she did on her adm date. Pt still continues to have a cough at times during the night.   A:  Support and encouragement was offered. 15 min checks continued for safety.  R: Pt remains safe.

## 2014-11-12 NOTE — [facility] Group Notes (Signed)
[facility] LCSW Group Therapy  11/12/2014 3:42 PM  Type of Therapy:  Group Therapy  Participation Level:  Active  Participation Quality:  Attentive  Affect:  Appropriate  Cognitive:  Alert and Oriented  Insight:  Engaged  Engagement in Therapy:  Engaged  Modes of Intervention:  Confrontation, Discussion, Education, Exploration, Problem-solving, Rapport Building, Socialization and Support  Summary of Progress/Problems:  Finding Balance in Life. Today's group focused on defining balance in one's own words, identifying things that can knock one off balance, and exploring healthy ways to maintain balance in life. Group members were asked to provide an example of a time when they felt off balance, describe how they handled that situation,and process healthier ways to regain balance in the future. Group members were asked to share the most important tool for maintaining balance that they learned while [facility] and how they plan to apply this method after discharge. [place] shared that she is feeling more balanced than she did upon admission. She talked about her strained relationship with her husband and lack of support and how this affects her sense of wellbeing. Leshonda also talked about how medical problems and deteriorating health contributes to her depression and subsequent loss of balance. She continues to show progress in the group setting and improving insight AEB her ability to identify ways that she can improve her sense of balance in what she is able to control--"[facility] to [facility], reading my books, and getting into a support group." Kalandra was pleasant and open to discussion in the group setting.    Smart, Sankalp Ferrell LCSWA  11/12/2014, 3:42 PM

## 2014-11-12 NOTE — Progress Notes (Signed)
Adult Psychoeducational Group Note  Date:  11/12/2014 Time:  9:33 PM  Group Topic/Focus:  Wrap-Up Group:   The focus of this group is to help patients review their daily goal of treatment and discuss progress on daily workbooks.  Participation Level:  Active  Participation Quality:  Appropriate and Attentive  Affect:  Appropriate  Cognitive:  Appropriate  Insight: Appropriate  Engagement in Group:  Engaged  Modes of Intervention:  Discussion  Additional Comments:  Pts discussed the theme for the day "Recovery." Pt was able to come up with 2 things she can do to help maintain her recovery: 1) look for more support groups 2) have better self-care and reading.  Caswell CorwinOwen, Pearce Littlefield C 11/12/2014, 9:33 PM

## 2014-11-12 NOTE — Progress Notes (Signed)
D: Pt informed the writer that she may be discharged tomorrow. Pt smiled and informed the writer that she's ready to go, and plans to continue taking her medications. Pt stated she feels better each day.  Pt continues to cough. Writer gave pt prn nebulizer, and pt went to group.   A:  Support and encouragement was offered. 15 min checks continued for safety.  R: Pt remains safe.

## 2014-11-12 NOTE — Progress Notes (Signed)
[facility] MD Progress Note  11/12/2014 1:11 PM [name]  MRN:  [id number] Subjective:Patient states "I am feeling better, my husband came to visit me and it went well. I still have cough ,it is improved a little bit.'  Objective; Patient seen and chart reviewed.Patient discussed with treatment team. Patient reports her depression as improving. Patient also reports that she slept well initially  last night on the Trazodone ,but sleep was disrupted later on due to cough. Patient had visit from husband (main reason she is in the hospital is due to relational struggles with husband.) last night and it went well. Patient is looking forward to getting psychotherapy /marital therapy once discharged . Would like to pursue this with husband. Patient denies SI/HI/AH/VH today. Patient attending group activities. Denies any side effects of medications.   Diagnosis:   DSM5: Depressive Disorders:  Major Depressive Disorder - Severe (296.23),with out psychosis    Total Time spent with patient: 45 minutes   ADL's:  Intact  Sleep: Poor  Appetite:  Fair   Psychiatric Specialty Exam: Physical Exam  Review of Systems  Constitutional: Negative.   HENT: Negative.   Eyes: Negative.   Respiratory: Positive for cough. Negative for sputum production.   Cardiovascular: Negative.   Gastrointestinal: Negative.   Genitourinary: Negative.   Musculoskeletal: Negative.   Skin: Negative.   Neurological: Negative.   Psychiatric/Behavioral: Positive for depression. The patient is nervous/anxious.     Blood pressure 134/64, pulse 89, temperature 98.2 F (36.8 C), temperature source Oral, resp. rate 18, height  (1.651 m), weight 91.627 kg (202 lb), SpO2 100 %.Body mass index is 33.61 kg/(m^2).  General Appearance: Fairly Groomed  Patent attorney::  Fair  Speech:  Clear and Coherent  Volume:  Normal  Mood:  Depressed  Affect:  Tearful  Thought Process:  Coherent  Orientation:  Full (Time, Place, and Person)   Thought Content:  WDL  Suicidal Thoughts:  No  Homicidal Thoughts:  No  Memory:  Immediate;   Fair Recent;   Fair Remote;   Fair  Judgement:  Fair  Insight:  Fair  Psychomotor Activity:  Normal  Concentration:  Fair  Recall:  [employer] of Knowledge:Fair  Language: Fair  Akathisia:  No  Handed:  Right  AIMS (if indicated):     Assets:  Communication Skills Desire for Improvement  Sleep:  Number of Hours: 6.25   Musculoskeletal: Strength & Muscle Tone: within normal limits Gait & Station: normal Patient leans: N/A  Current Medications: Current Facility-Administered Medications  Medication Dose Route Frequency Provider Last Rate Last Dose  . acetaminophen (TYLENOL) tablet 650 mg  650 mg Oral Q6H PRN Earney Navy, NP   650 mg at 11/10/14 1249  . albuterol (PROVENTIL) (2.5 MG/3ML) 0.083% nebulizer solution 2.5 mg  2.5 mg Nebulization Q4H PRN Jomarie Longs, MD      . alum & mag hydroxide-simeth (MAALOX/MYLANTA) 200-200-20 MG/5ML suspension 30 mL  30 mL Oral Q4H PRN Earney Navy, NP      . aspirin chewable tablet 81 mg  81 mg Oral Daily Earney Navy, NP   81 mg at 11/12/14 0800  . buPROPion (WELLBUTRIN XL) 24 hr tablet 450 mg  450 mg Oral q morning - 10a Earney Navy, NP   450 mg at 11/12/14 1007  . busPIRone (BUSPAR) tablet 20 mg  20 mg Oral [id number] Keiondre Colee, MD   20 mg at 11/12/14 0800  . clonazePAM (KLONOPIN) tablet 0.5 mg  0.5 mg Oral TID Audrea Muscat, NP   0.5 mg at 11/12/14 1207  . [START ON 11/15/2014] cloNIDine (CATAPRES - Dosed in mg/24 hr) patch 0.3 mg  0.3 mg Transdermal Q7 days Earney Navy, NP      . docusate sodium (COLACE) capsule 50 mg  50 mg Oral Daily PRN Earney Navy, NP      . furosemide (LASIX) tablet 40 mg  40 mg Oral q morning - 10a Earney Navy, NP   40 mg at 11/12/14 1007  . gabapentin (NEURONTIN) capsule 1,600 mg  1,600 mg Oral QPM Evanna Janann August, NP   1,600 mg at 11/11/14 1806  . gabapentin  (NEURONTIN) capsule 800 mg  800 mg Oral q morning - 10a Evanna Janann August, NP   800 mg at 11/12/14 1006  . guaiFENesin (MUCINEX) 12 hr tablet 600 mg  600 mg Oral BID Sanjuana Kava, NP   600 mg at 11/12/14 0800  . insulin aspart (novoLOG) injection 0-15 Units  0-15 Units Subcutaneous TID WC Earney Navy, NP   5 Units at 11/12/14 1208  . insulin aspart (novoLOG) injection 0-5 Units  0-5 Units Subcutaneous QHS Earney Navy, NP   3 Units at 11/09/14 2247  . insulin aspart (novoLOG) injection 6 Units  6 Units Subcutaneous TID WC Clearance Chenault, MD      . insulin glargine (LANTUS) injection 53 Units  53 Units Subcutaneous QHS Jomarie Longs, MD   53 Units at 11/11/14 2158  . isosorbide mononitrate (IMDUR) 24 hr tablet 60 mg  60 mg Oral q morning - 10a Earney Navy, NP   60 mg at 11/12/14 1006  . loratadine (CLARITIN) tablet 10 mg  10 mg Oral Daily Stephfon Bovey, MD   10 mg at 11/12/14 0800  . losartan (COZAAR) tablet 50 mg  50 mg Oral Daily Earney Navy, NP   50 mg at 11/12/14 0800  . magnesium hydroxide (MILK OF MAGNESIA) suspension 30 mL  30 mL Oral Daily PRN Earney Navy, NP      . metoprolol succinate (TOPROL-XL) 24 hr tablet 25 mg  25 mg Oral q morning - 10a Earney Navy, NP   25 mg at 11/12/14 1006  . OLANZapine zydis (ZYPREXA) 5 MG disintegrating tablet           . ondansetron (ZOFRAN) tablet 4 mg  4 mg Oral Q8H PRN Earney Navy, NP      . pantoprazole (PROTONIX) EC tablet 40 mg  40 mg Oral Daily Earney Navy, NP   40 mg at 11/12/14 0800  . phenol (CHLORASEPTIC) mouth spray 1 spray  1 spray Mouth/Throat PRN Elynore Dolinski, MD      . rosuvastatin (CRESTOR) tablet 10 mg  10 mg Oral QHS Earney Navy, NP   10 mg at 11/11/14 2156  . senna (SENOKOT) tablet 34.4 mg  4 tablet Oral Daily PRN Earney Navy, NP      . traZODone (DESYREL) tablet 50 mg  50 mg Oral QHS Jomarie Longs, MD   50 mg at 11/11/14 2157    Lab Results:  Results for orders  placed or performed during the hospital encounter of 11/09/14 (from the past 48 hour(s))  Glucose, capillary     Status: Abnormal   Collection Time: 11/10/14  5:10 PM  Result Value Ref Range   Glucose-Capillary 192 (H) 70 - 99 mg/dL  Glucose, capillary     Status: Abnormal  Collection Time: 11/10/14  9:18 PM  Result Value Ref Range   Glucose-Capillary 156 (H) 70 - 99 mg/dL  Glucose, capillary     Status: Abnormal   Collection Time: 11/11/14  5:58 AM  Result Value Ref Range   Glucose-Capillary 159 (H) 70 - 99 mg/dL  Glucose, capillary     Status: Abnormal   Collection Time: 11/11/14 11:57 AM  Result Value Ref Range   Glucose-Capillary 249 (H) 70 - 99 mg/dL   Comment 1 Documented in Chart   Glucose, capillary     Status: Abnormal   Collection Time: 11/11/14  5:15 PM  Result Value Ref Range   Glucose-Capillary 237 (H) 70 - 99 mg/dL   Comment 1 Documented in Chart   Glucose, capillary     Status: Abnormal   Collection Time: 11/11/14  9:09 PM  Result Value Ref Range   Glucose-Capillary 200 (H) 70 - 99 mg/dL   Comment 1 Notify RN   Glucose, capillary     Status: Abnormal   Collection Time: 11/12/14  6:20 AM  Result Value Ref Range   Glucose-Capillary 171 (H) 70 - 99 mg/dL    Physical Findings: AIMS: Facial and Oral Movements Muscles of Facial Expression: None, normal Lips and Perioral Area: None, normal Jaw: None, normal Tongue: None, normal,Extremity Movements Upper (arms, wrists, hands, fingers): None, normal Lower (legs, knees, ankles, toes): None, normal, Trunk Movements Neck, shoulders, hips: None, normal, Overall Severity Severity of abnormal movements (highest score from questions above): None, normal Incapacitation due to abnormal movements: None, normal Patient's awareness of abnormal movements (rate only patient's report): No Awareness, Dental Status Current problems with teeth and/or dentures?: No Does patient usually wear dentures?: No  CIWA:    COWS:      Treatment Plan Summary: Daily contact with patient to assess and evaluate symptoms and progress in treatment Medication management  Assessment: Patient presented with worsening depression as well as SI. Patient is improving on current treatment. Would like to pursue CBT on discharge.   Plan: Will continue Wellbutrin XL 450 mg daily for depression. Change Buspar to 20 mg po tid for anxiety sx. ContinueTrazodone 50 mg po qhs for sleep. Discussed sleep restriction. Klonopin  0.5 mg as scheduled. Discussed risks,side effects of BZD, discussed alternative treatment as well as the need to taper off klonopin . Patient voices understanding. Continue medications as scheduled for cough- improving. Will add albuterol prn. Reviewed Diabetic coordinator notes - will follow their recommendations. CSW will work on disposition Patient to participate in therapeutic milieu.   Medical Decision Making Problem Points:  Established problem, stable/improving (1), Review of last therapy session (1) and Review of psycho-social stressors (1) Data Points:  Review or order medicine tests (1) Review and summation of old records (2) Review of medication regiment & side effects (2) Review of new medications or change in dosage (2)  I certify that inpatient services furnished can reasonably be expected to improve the patient's condition.   La Shehan MD 11/12/2014, 1:11 PM

## 2014-11-12 NOTE — Progress Notes (Signed)
D: Patient presents with appropriate affect and anxious mood. She reported on the self inventory sheet that her sleep and appetite are both fair, energy level is low and ability to concentrate is good. Patient rates depression "4", feelings of hopelessness "2" and anxiety "6". She's actively participating in groups, visible in the milieu and interactive with peers in the dayroom. Patient is compliant with medication regimen.  A: Support and encouragement provided to patient. Scheduled medications given per MD orders. Maintain Q15 minute checks for safety.  R: Patient receptive. Denies SI/HI and AVH. Patient remains safe on the hall.

## 2014-11-13 ENCOUNTER — Other Ambulatory Visit (HOSPITAL_COMMUNITY): Payer: 59

## 2014-11-13 ENCOUNTER — Telehealth: Payer: Self-pay | Admitting: *Deleted

## 2014-11-13 ENCOUNTER — Encounter (HOSPITAL_COMMUNITY): Payer: Self-pay | Admitting: Registered Nurse

## 2014-11-13 ENCOUNTER — Ambulatory Visit (INDEPENDENT_AMBULATORY_CARE_PROVIDER_SITE_OTHER): Payer: 59 | Admitting: Psychiatry

## 2014-11-13 VITALS — BP 129/60 | HR 95 | Ht 65.0 in | Wt 209.0 lb

## 2014-11-13 DIAGNOSIS — F331 Major depressive disorder, recurrent, moderate: Secondary | ICD-10-CM

## 2014-11-13 DIAGNOSIS — R05 Cough: Secondary | ICD-10-CM | POA: Insufficient documentation

## 2014-11-13 DIAGNOSIS — F329 Major depressive disorder, single episode, unspecified: Secondary | ICD-10-CM

## 2014-11-13 DIAGNOSIS — R059 Cough, unspecified: Secondary | ICD-10-CM | POA: Insufficient documentation

## 2014-11-13 LAB — GLUCOSE, CAPILLARY: Glucose-Capillary: 145 mg/dL — ABNORMAL HIGH (ref 70–99)

## 2014-11-13 MED ORDER — CLONAZEPAM 0.5 MG PO TABS: 0.5000 mg | ORAL_TABLET | Freq: Three times a day (TID) | ORAL | Status: DC

## 2014-11-13 MED ORDER — DEXLANSOPRAZOLE 60 MG PO CPDR: 60.0000 mg | DELAYED_RELEASE_CAPSULE | Freq: Every day | ORAL | Status: AC

## 2014-11-13 MED ORDER — TRAZODONE HCL 50 MG PO TABS: 50.0000 mg | ORAL_TABLET | Freq: Every day | ORAL | Status: DC

## 2014-11-13 MED ORDER — AZITHROMYCIN 500 MG PO TABS
500.0000 mg | ORAL_TABLET | Freq: Every day | ORAL | Status: AC
  Administered 2014-11-13: 500 mg via ORAL
  Filled 2014-11-13: qty 1
  Filled 2014-11-13: qty 2

## 2014-11-13 MED ORDER — BUSPIRONE HCL 10 MG PO TABS: 20.0000 mg | ORAL_TABLET | ORAL | Status: DC

## 2014-11-13 MED ORDER — AZITHROMYCIN 250 MG PO TABS
250.0000 mg | ORAL_TABLET | Freq: Every day | ORAL | Status: DC
  Filled 2014-11-13: qty 4

## 2014-11-13 MED ORDER — GUAIFENESIN ER 600 MG PO TB12: 600.0000 mg | ORAL_TABLET | Freq: Two times a day (BID) | ORAL | Status: DC

## 2014-11-13 MED ORDER — BUPROPION HCL ER (XL) 450 MG PO TB24: 450.0000 mg | ORAL_TABLET | Freq: Every morning | ORAL | Status: DC

## 2014-11-13 MED ORDER — SERTRALINE HCL 50 MG PO TABS: 50.0000 mg | ORAL_TABLET | Freq: Every day | ORAL | Status: DC

## 2014-11-13 MED ORDER — BUPROPION HCL ER (XL) 150 MG PO TB24
450.0000 mg | ORAL_TABLET | Freq: Every day | ORAL | Status: DC
  Filled 2014-11-13: qty 9

## 2014-11-13 MED ORDER — GABAPENTIN 400 MG PO CAPS: ORAL_CAPSULE | ORAL | Status: DC

## 2014-11-13 MED ORDER — AZITHROMYCIN 250 MG PO TABS: ORAL_TABLET | ORAL | Status: DC

## 2014-11-13 MED ORDER — BENZONATATE 100 MG PO CAPS
200.0000 mg | ORAL_CAPSULE | Freq: Three times a day (TID) | ORAL | Status: DC | PRN
  Administered 2014-11-13: 200 mg via ORAL
  Filled 2014-11-13: qty 2
  Filled 2014-11-13: qty 18

## 2014-11-13 MED ORDER — LORATADINE 10 MG PO TABS: 10.0000 mg | ORAL_TABLET | Freq: Every day | ORAL | Status: DC

## 2014-11-13 MED ORDER — ALBUTEROL SULFATE (2.5 MG/3ML) 0.083% IN NEBU: 2.5000 mg | INHALATION_SOLUTION | RESPIRATORY_TRACT | Status: DC | PRN

## 2014-11-13 MED ORDER — CLONAZEPAM 0.5 MG PO TABS
0.5000 mg | ORAL_TABLET | Freq: Three times a day (TID) | ORAL | Status: DC
Start: 2014-11-13 — End: 2014-11-13

## 2014-11-13 MED ORDER — BENZONATATE 200 MG PO CAPS: 200.0000 mg | ORAL_CAPSULE | Freq: Three times a day (TID) | ORAL | Status: DC | PRN

## 2014-11-13 NOTE — [facility] Group Notes (Signed)
[facility]   11/13/2014 10:06 AM  Participation Quality:  Appropriate   Mood/Affect: pleasant/calm   Depression Rating:  2  Anxiety Rating:  2  Thoughts of Suicide:  No Will you contract for safety?   NA  Current AVH:  No  Plan for Discharge/Comments:  Pt reports that she is happy to be d/cing home today and is worried about her cough "I think I have bronchitis." PT reports that she was given antibiotic. [facility] and was linked with a new therapist. Pt denies SI/HI/AVH and presents with pleasant mood.   Transportation Means: Pt's husband to pick her up at 11:30AM    Supports: husband/some family supports.   Smart, American FinancialHeather LCSWA

## 2014-11-13 NOTE — Progress Notes (Signed)
[facility] MD Progress Note  01/25/4741 5:95 PM [name]  MRN:  [id number] Subjective:  Doing fair Since I've seen this patient she's been in the IOP program and then was on the inpatient psychiatric unit at this facility up until today. She was apparently discharged yesterday. The patient says she is somewhat improved in that she feels less weepy. I translate that as meaning she cries less. The patient still describes being persistently depressed. She started with a new therapist in the next few days. The patient says she sleeping fairly well and eating okay. The patient says she feels significant anxiety and was given Klonopin only a few days ago. In essence it as of yet a chance to work. Interview the patient says she's been on Wellbutrin for well over a year and is not sure if it had any effect. For the time being I think will continue all her medications. In the hospital she had the Wellbutrin 450, had BuSpar increased to 20 mg 3 times a day was begun on Klonopin 0.5 3 times a day. I will go slow. At this time the patient is not suicidal. She seems to be functioning fairly well but she's been out of the hospital only a day. Principal Problem: @PPROB @ Diagnosis:   Patient Active Problem List   Diagnosis Date Noted  . Cough [R05]   . MDD (major depressive disorder), recurrent severe, without psychosis [F33.2] 11/11/2014  . Major depressive disorder, recurrent severe without psychotic features [F33.2]   . Left wrist pain [M25.532] 09/13/2014  . Right carpal tunnel syndrome [G56.01] 09/13/2014  . Degenerative joint disease involving multiple joints on both sides of body (right foot pain) [M15.9] 08/14/2014  . Spondylosis of lumbar region without myelopathy or radiculopathy [M47.816] 08/06/2014  . Lumbosacral spondylosis without myelopathy [M47.817] 07/09/2014  . 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 TMJ (dislocation of temporomandibular joint) [S03.0XXA] 06/04/2014  . Chronic low back pain [M54.5,  G89.29] 04/15/2014  . Periumbilical hernia [I95.1] 02/02/2014  . Hot flashes [N95.1] 01/02/2014  . Diastolic CHF [O84.16] 60/63/0160  . Major depressive disorder, recurrent episode, unspecified [F33.9] 06/20/2013  . Fibromyalgia syndrome [M79.7] 06/18/2013  . Abdominal pain, epigastric [R10.13] 03/29/2013  . Myalgia [M79.1] 12/21/2012  . Chronic chest pain [R07.9, G89.29] 11/21/2012  . Carpal tunnel syndrome, bilateral [G56.01, G56.02] 07/25/2012  . Tension headache, chronic [G44.229] 05/24/2012  . Iron deficiency anemia [D50.9] 09/22/2011  . Depression [F32.9] 07/14/2011  . Anxiety [F41.9] 06/02/2011  . CKD (chronic kidney disease) stage 3, GFR 30-59 ml/min [N18.3] 01/22/2011  . Lumbar spondylosis with myelopathy [M47.16] 11/20/2010  . Coronary atherosclerosis [I25.10] 03/10/2010  . Dyslipidemia [E78.5] 12/11/2008  . VITAMIN D DEFICIENCY [E55.9] 11/26/2008  . Uncontrolled type 2 diabetes mellitus with insulin therapy [E11.65] 05/25/2007  . PERIPHERAL NEUROPATHY [G60.9] 05/25/2007  . Essential hypertension [I10] 05/25/2007  . GERD [K21.9] 05/25/2007  . SYMPTOM, APNEA, SLEEP NOS [G47.30] 05/25/2007   Total Time spent with patient: 30 minutes   Past Medical History:  Past Medical History  Diagnosis Date  . Hypertension   . Peripheral neuropathy   . Cervicalgia   . Low back pain syndrome   . Hyperlipidemia   . Vitamin D deficiency   . Vitamin B12 deficiency   . Iliotibial band syndrome   . Anemia   . Borderline personality disorder   . Nonorganic psychosis   . GERD (gastroesophageal reflux disease)   . Diabetes mellitus   . Depression   . Diabetic gastroparesis   . CAD (coronary  artery disease)     Catherization 11/18/09>nonobstructive CAD , normal LV function, EF 65%  . Health maintenance examination     Colonoscopy 2009> normal; Diabetic eye exam 12/2008. No diabetic retinopathy; Mammogram 11/10> No evidence of malignancy, DXA 03/14/13 : normal  . Headache(784.0)   .  Arthritis   . SBO (small bowel obstruction) 01/09/2013  . Diastolic CHF     Grade I, on Echo 06/2013, EF 55-60%  . Sleep apnea     does not wear CPAP   . Diabetes mellitus, type II   . Wears glasses   . Wears dentures     upper  . Gout   . Right carpal tunnel syndrome 09/13/2014    Past Surgical History  Procedure Laterality Date  . Abdominal hysterectomy    . Hand surgery  1992    rt  . Bowel resection N/A 01/10/2013    Procedure: SMALL BOWEL RESECTION;  Surgeon: Lodema Pilot, DO;  Location: [facility] OR;  Service: General;  Laterality: N/A;  . Lysis of adhesion N/A 01/10/2013    Procedure: LYSIS OF ADHESION;  Surgeon: Lodema Pilot, DO;  Location: [facility] OR;  Service: General;  Laterality: N/A;  . Laparotomy N/A 01/10/2013    Procedure: EXPLORATORY LAPAROTOMY;  Surgeon: Lodema Pilot, DO;  Location: [facility] OR;  Service: General;  Laterality: N/A;  . Incisional hernia repair N/A 03/01/2014    Procedure: LAPAROSCOPIC INCISIONAL HERNIA;  Surgeon: Axel Filler, MD;  Location: [facility] ORS;  Service: General;  Laterality: N/A;  . Insertion of mesh N/A 03/01/2014    Procedure: INSERTION OF MESH;  Surgeon: Axel Filler, MD;  Location: [facility] ORS;  Service: General;  Laterality: N/A;  . Hernia repair  03/01/14    Lap incisional hernia repair w/mesh  . Appendectomy    . Carpal tunnel release Right 09/13/2014    Procedure: RIGHT WRIST CARPAL TUNNEL RELEASE;  Surgeon: Eulas Post, MD;  Location: [facility];  Service: Orthopedics;  Laterality: Right;  . Steriod injection Left 09/13/2014    Procedure: STEROID INJECTION LEFT HAND;  Surgeon: Eulas Post, MD;  Location: [facility];  Service: Orthopedics;  Laterality: Left;   Family History:  Family History  Problem Relation Age of Onset  . Diabetes Mother   . Hypertension Mother   . Hyperlipidemia Mother   . Arthritis Mother   . Depression Mother   . Heart disease Father   . Diabetes Sister   . Diabetes Brother   . Heart  disease Brother   . Heart disease Paternal Grandmother   . Seizures Brother    Social History:  History  Alcohol Use No     History  Drug Use No    History   Social History  . Marital Status: Married    Spouse Name: N/A    Number of Children: N/A  . Years of Education: N/A   Social History Main Topics  . Smoking status: Former Smoker    Types: Cigarettes    Quit date: 10/25/2002  . Smokeless tobacco: Never Used  . Alcohol Use: No  . Drug Use: No  . Sexual Activity: Not Currently   Other Topics Concern  . Not on file   Social History Narrative   Additional History:    Sleep: Fair  Appetite:  Good   Assessment:   Musculoskeletal: Strength & Muscle Tone:  Gait & Station: normal Patient leans:    Psychiatric Specialty Exam: Physical Exam  ROS  Blood pressure 129/60,  pulse 95, height $RemoveBe'5\' 5"'UynMLrBvr$  (1.651 m), weight 209 lb (94.802 kg).Body mass index is 34.78 kg/(m^2).  General Appearance: Casual  Eye Contact::  Good  Speech:  Clear and Coherent  Volume:  Normal  Mood:  Euthymic  Affect:  Appropriate  Thought Process:  Coherent  Orientation:  Full (Time, Place, and Person)  Thought Content:  WDL  Suicidal Thoughts:  No  Homicidal Thoughts:  No  Memory:  NA  Judgement:  NA  Insight:  Fair  Psychomotor Activity:  Normal  Concentration:  Good  Recall:  Good  Fund of Knowledge:Good  Language: Good  Akathisia:  No  Handed:  Right  AIMS (if indicated):     Assets:  Communication Skills  ADL's:  Intact  Cognition: WNL  Sleep:        Current Medications: Current Outpatient Prescriptions  Medication Sig Dispense Refill  . ACCU-CHEK FASTCLIX LANCETS MISC Use to Check Blood Sugars up to 4 Times a Day. ICD-10 Code: E11.65 102 each 6  . acetaminophen-codeine (TYLENOL #4) 300-60 MG per tablet take 1 tablet by mouth three times a day 90 tablet 1  . albuterol (PROVENTIL) (2.5 MG/3ML) 0.083% nebulizer solution Take 3 mLs (2.5 mg total) by nebulization every 4  (four) hours as needed for wheezing or shortness of breath (cough). 75 mL 12  . aspirin 81 MG chewable tablet Chew 81 mg by mouth daily.    Derrill Memo ON 11/14/2014] azithromycin (ZITHROMAX) 250 MG tablet On first day take 2 tablets (500 mg) on the next four days take 1 tablet (250 mg) for bacterial infection 6 each 0  . benzonatate (TESSALON) 200 MG capsule Take 1 capsule (200 mg total) by mouth 3 (three) times daily as needed for cough. 20 capsule 0  . BuPROPion HCl ER, XL, 450 MG TB24 Take 450 mg by mouth every morning. 30 tablet 5  . busPIRone (BUSPAR) 10 MG tablet Take 2 tablets (20 mg total) by mouth 3 (three) times daily at 8am, 2pm and bedtime. For anxiety 30 tablet 0  . clonazePAM (KLONOPIN) 0.5 MG tablet Take 1 tablet (0.5 mg total) by mouth 3 (three) times daily. For anxiety 90 tablet 4  . cloNIDine (CATAPRES - DOSED IN MG/24 HR) 0.3 mg/24hr patch Place 1 patch (0.3 mg total) onto the skin every 7 (seven) days. 4 patch 11  . dexlansoprazole (DEXILANT) 60 MG capsule Take 1 capsule (60 mg total) by mouth daily. For reflux 30 capsule   . Docusate Sodium (COLACE PO) Take 2 tablets by mouth daily as needed (for constipation).     . furosemide (LASIX) 40 MG tablet Take 1 tablet (40 mg total) by mouth every morning. 30 tablet 3  . gabapentin (NEURONTIN) 400 MG capsule Take 2 tablets (800 mg) every morning and 4 tablets (1,600 mg) every night 180 capsule 0  . glucose blood (ACCU-CHEK AVIVA PLUS) test strip Check blood sugar up to 4 times a day as instructed 125 each 5  . guaiFENesin (MUCINEX) 600 MG 12 hr tablet Take 1 tablet (600 mg total) by mouth 2 (two) times daily. 28 tablet 0  . insulin aspart (NOVOLOG FLEXPEN) 100 UNIT/ML FlexPen inject with meals and snacks up to 8 times a day as directed by your aviva glucometer. (Patient taking differently: Inject into the skin. inject with meals and snacks up to 8 times a day as directed by your aviva glucometer. Sliding scale) 30 mL 11  . Insulin Glargine  (LANTUS SOLOSTAR) 100 UNIT/ML Solostar Pen  Inject 51 Units into the skin at bedtime. 15 mL 11  . Insulin Pen Needle (BD PEN NEEDLE NANO U/F) 32G X 4 MM MISC USE TO TEST UP TO 5 TIMES A DAY ACCORDING TO CBG 100 each 4  . isosorbide mononitrate (IMDUR) 60 MG 24 hr tablet Take 1 tablet (60 mg total) by mouth every morning. 30 tablet 6  . Lancets Misc. (ACCU-CHEK FASTCLIX LANCET) KIT Use to check blood sugars up to 5 times a day 1 kit 2  . loratadine (CLARITIN) 10 MG tablet Take 1 tablet (10 mg total) by mouth daily. For seasonal allergic rhinitis 30 tablet 0  . losartan (COZAAR) 100 MG tablet Take 50 mg by mouth every morning.    . metoprolol succinate (TOPROL-XL) 25 MG 24 hr tablet Take 1 tablet (25 mg total) by mouth every morning. 30 tablet 6  . ondansetron (ZOFRAN) 4 MG tablet Take 4 mg by mouth every 8 (eight) hours as needed for nausea.     . rosuvastatin (CRESTOR) 10 MG tablet Take 1 tablet (10 mg total) by mouth at bedtime. 30 tablet 11  . senna (SENOKOT) 8.6 MG TABS Take 4 tablets by mouth daily as needed (for constipation).     . sertraline (ZOLOFT) 50 MG tablet Take 1 tablet (50 mg total) by mouth daily. 30 tablet 5  . Simethicone (GAS-X PO) Take 2 tablets by mouth as needed (gas).    Marland Kitchen tiZANidine (ZANAFLEX) 2 MG tablet Take 1 tablet (2 mg total) by mouth every 8 (eight) hours as needed for muscle spasms. 90 tablet 1  . traZODone (DESYREL) 50 MG tablet Take 1 tablet (50 mg total) by mouth at bedtime. 30 tablet 0   No current facility-administered medications for this visit.    Lab Results:  Results for orders placed or performed during the hospital encounter of 11/09/14 (from the past 48 hour(s))  Glucose, capillary     Status: Abnormal   Collection Time: 11/11/14  5:15 PM  Result Value Ref Range   Glucose-Capillary 237 (H) 70 - 99 mg/dL   Comment 1 Documented in Chart   Glucose, capillary     Status: Abnormal   Collection Time: 11/11/14  9:09 PM  Result Value Ref Range    Glucose-Capillary 200 (H) 70 - 99 mg/dL   Comment 1 Notify RN   Glucose, capillary     Status: Abnormal   Collection Time: 11/12/14  6:20 AM  Result Value Ref Range   Glucose-Capillary 171 (H) 70 - 99 mg/dL  Glucose, capillary     Status: Abnormal   Collection Time: 11/12/14 11:53 AM  Result Value Ref Range   Glucose-Capillary 216 (H) 70 - 99 mg/dL   Comment 1 Notify RN   Glucose, capillary     Status: Abnormal   Collection Time: 11/12/14  5:02 PM  Result Value Ref Range   Glucose-Capillary 181 (H) 70 - 99 mg/dL   Comment 1 Documented in Chart   Glucose, capillary     Status: Abnormal   Collection Time: 11/12/14  9:04 PM  Result Value Ref Range   Glucose-Capillary 215 (H) 70 - 99 mg/dL   Comment 1 Notify RN   Glucose, capillary     Status: Abnormal   Collection Time: 11/13/14  6:08 AM  Result Value Ref Range   Glucose-Capillary 145 (H) 70 - 99 mg/dL    Physical Findings: AIMS:  , ,  ,  ,    CIWA:    COWS:  Treatment Plan Summary:    Medical Decision Making:  Established Problem, Stable/Improving (1) Problem Points:  Review of last therapy session (1) Data Points:  Review of medication regiment & side effects (2)    Benjamen Koelling IRVING 11/13/2014, 4:26 PM

## 2014-11-13 NOTE — [facility] Suicide Risk Assessment (Addendum)
[facility] INPATIENT:  Family/Significant Other Suicide Prevention Education  Suicide Prevention Education:  Contact Attempts: Brent BullaLawrence Esselman (pt's husband) [phone] has been identified by the patient as the family member/significant other with whom the patient will be residing, and identified as the person(s) who will aid the patient in the event of a mental health crisis.  With written consent from the patient, two attempts were made to provide suicide prevention education, prior to and/or following the patient's discharge.  We were unsuccessful in providing suicide prevention education.  A suicide education pamphlet was given to the patient to share with family/significant other.  Date and time of first attempt: 10:30AM 10/1914 Date and time of second attempt: 9:50AM 11/13/14-vm left requesting call back at earliest convenience.   Smart, Ion Gonnella LCSWA  11/13/2014, 9:51 AM   Pt's husband called back. SPE completed. CSW encouraged pt's husband to check in with pt daily to make sure sure is doing well and work on communication (Per pt's request). Lawrence verbalized understanding of information and was encouraged to ask questions. At this time, he had no concerns about SI or about pt returning home today.   The Sherwin-WilliamsHeather Smart, LCSWA  11/13/2014 10:01 AM

## 2014-11-13 NOTE — Progress Notes (Signed)
Discharge Note: Discharge instructions/prescriptions/medication samples given to patient. Patient verbalized understanding of discharge instructions and prescriptions. Returned belongings to patient. Denies SI/HI/AVH. Patient d/c without incident to the lobby and transported home with husband.

## 2014-11-13 NOTE — Discharge Summary (Signed)
Physician Discharge Summary Note  Patient:  [name] is an 64 y.o., female MRN:  [id number] DOB:  August 12, 1951 Patient phone:  [phone] (home)  Patient address:   [place]. [place] [address],  Total Time spent with patient: Greater than 30 min  Date of Admission:  11/09/2014 Date of Discharge: 11/13/2014  Reason for Admission:  64 year old female admitted after reportedly stopping her medications in hopes that she would die as a result. She states that her symptoms are likely a result of her faith not being as string as it should be. Reports that she is a [place] and has not ben as involved in church and activities within [employer] and expressing some concerns in regards to this. Patient acknowledges that her husband is somewhat supportive of her, her psychiatric illnesses, and her medical illnesses. States that she is tired of being sick and administering insulin injections. States that financial concerns are also a burden and that aforementioned issues have contributed to her feeling like not being here anymore was best "at the time". Spoke with patient for an hour, providing encouragement and supporting and discussed utilization of coping mechanisms. Good insight. She denies SI, HI, or AVH.   Principal Problem: MDD (major depressive disorder), recurrent severe, without psychosis Discharge Diagnoses: Patient Active Problem List   Diagnosis Date Noted  . Cough [R05]   . MDD (major depressive disorder), recurrent severe, without psychosis [F33.2] 11/11/2014  . Major depressive disorder, recurrent severe without psychotic features [F33.2]   . Left wrist pain [M25.532] 09/13/2014  . Right carpal tunnel syndrome [G56.01] 09/13/2014  . Degenerative joint disease involving multiple joints on both sides of body (right foot pain) [M15.9] 08/14/2014  . Spondylosis of lumbar region without myelopathy or radiculopathy [M47.816] 08/06/2014  . Lumbosacral spondylosis  without myelopathy [M47.817] 07/09/2014  . 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 TMJ (dislocation of temporomandibular joint) [S03.0XXA] 06/04/2014  . Chronic low back pain [M54.5, G89.29] 04/15/2014  . Periumbilical hernia [J15.5] 02/02/2014  . Hot flashes [N95.1] 01/02/2014  . Diastolic CHF [M08.02] 23/36/1224  . Major depressive disorder, recurrent episode, unspecified [F33.9] 06/20/2013  . Fibromyalgia syndrome [M79.7] 06/18/2013  . Abdominal pain, epigastric [R10.13] 03/29/2013  . Myalgia [M79.1] 12/21/2012  . Chronic chest pain [R07.9, G89.29] 11/21/2012  . Carpal tunnel syndrome, bilateral [G56.01, G56.02] 07/25/2012  . Tension headache, chronic [G44.229] 05/24/2012  . Iron deficiency anemia [D50.9] 09/22/2011  . Depression [F32.9] 07/14/2011  . Anxiety [F41.9] 06/02/2011  . CKD (chronic kidney disease) stage 3, GFR 30-59 ml/min [N18.3] 01/22/2011  . Lumbar spondylosis with myelopathy [M47.16] 11/20/2010  . Coronary atherosclerosis [I25.10] 03/10/2010  . Dyslipidemia [E78.5] 12/11/2008  . VITAMIN D DEFICIENCY [E55.9] 11/26/2008  . Uncontrolled type 2 diabetes mellitus with insulin therapy [E11.65] 05/25/2007  . PERIPHERAL NEUROPATHY [G60.9] 05/25/2007  . Essential hypertension [I10] 05/25/2007  . GERD [K21.9] 05/25/2007  . SYMPTOM, APNEA, SLEEP NOS [G47.30] 05/25/2007    Musculoskeletal: Strength & Muscle Tone: within normal limits Gait & Station: normal Patient leans: N/A  Psychiatric Specialty Exam:  See Suicide Risk Assessment  Physical Exam  Review of Systems  Psychiatric/Behavioral: Negative for suicidal ideas, hallucinations and memory loss. Depression: stable. Nervous/anxious: stable. Insomnia: stable.   All other systems reviewed and are negative.   Blood pressure 139/63, pulse 87, temperature 97.8 F (36.6 C), temperature source Oral, resp. rate 18, height 5' 5" (1.651 m), weight 91.627 kg (202 lb), SpO2 100 %.Body mass index is 33.61 kg/(m^2).   Past  Medical  History:  Past Medical History  Diagnosis Date  . Hypertension   . Peripheral neuropathy   . Cervicalgia   . Low back pain syndrome   . Hyperlipidemia   . Vitamin D deficiency   . Vitamin B12 deficiency   . Iliotibial band syndrome   . Anemia   . Borderline personality disorder   . Nonorganic psychosis   . GERD (gastroesophageal reflux disease)   . Diabetes mellitus   . Depression   . Diabetic gastroparesis   . CAD (coronary artery disease)     Catherization 11/18/09>nonobstructive CAD , normal LV function, EF 65%  . Health maintenance examination     Colonoscopy 2009> normal; Diabetic eye exam 12/2008. No diabetic retinopathy; Mammogram 11/10> No evidence of malignancy, DXA 03/14/13 : normal  . Headache(784.0)   . Arthritis   . SBO (small bowel obstruction) 01/09/2013  . Diastolic CHF     Grade I, on Echo 06/2013, EF 55-60%  . Sleep apnea     does not wear CPAP   . Diabetes mellitus, type II   . Wears glasses   . Wears dentures     upper  . Gout   . Right carpal tunnel syndrome 09/13/2014    Past Surgical History  Procedure Laterality Date  . Abdominal hysterectomy    . Hand surgery  1992    rt  . Bowel resection N/A 01/10/2013    Procedure: SMALL BOWEL RESECTION;  Surgeon: Madilyn Hook, DO;  Location: [place];  Service: General;  Laterality: N/A;  . Lysis of adhesion N/A 01/10/2013    Procedure: LYSIS OF ADHESION;  Surgeon: Madilyn Hook, DO;  Location: [place];  Service: General;  Laterality: N/A;  . Laparotomy N/A 01/10/2013    Procedure: EXPLORATORY LAPAROTOMY;  Surgeon: Madilyn Hook, DO;  Location: [place];  Service: General;  Laterality: N/A;  . Incisional hernia repair N/A 03/01/2014    Procedure: LAPAROSCOPIC INCISIONAL HERNIA;  Surgeon: Ralene Ok, MD;  Location: [facility];  Service: General;  Laterality: N/A;  . Insertion of mesh N/A 03/01/2014    Procedure: INSERTION OF MESH;  Surgeon: Ralene Ok, MD;  Location: [facility];  Service: General;  Laterality: N/A;  .  Hernia repair  03/01/14    Lap incisional hernia repair w/mesh  . Appendectomy    . Carpal tunnel release Right 09/13/2014    Procedure: RIGHT WRIST CARPAL TUNNEL RELEASE;  Surgeon: Johnny Bridge, MD;  Location: [facility];  Service: Orthopedics;  Laterality: Right;  . Steriod injection Left 09/13/2014    Procedure: STEROID INJECTION LEFT HAND;  Surgeon: Johnny Bridge, MD;  Location: [place];  Service: Orthopedics;  Laterality: Left;   Family History:  Family History  Problem Relation Age of Onset  . Diabetes Mother   . Hypertension Mother   . Hyperlipidemia Mother   . Arthritis Mother   . Depression Mother   . Heart disease Father   . Diabetes Sister   . Diabetes Brother   . Heart disease Brother   . Heart disease Paternal Grandmother   . Seizures Brother    Social History:  History  Alcohol Use No     History  Drug Use No    History   Social History  . Marital Status: Married    Spouse Name: N/A    Number of Children: N/A  . Years of Education: N/A   Social History Main Topics  . Smoking status: Former Smoker  Types: Cigarettes    Quit date: 10/25/2002  . Smokeless tobacco: Never Used  . Alcohol Use: No  . Drug Use: No  . Sexual Activity: Not Currently   Other Topics Concern  . None   Social History Narrative    Risk to Self: Is patient at risk for suicide?: Yes What has been your use of drugs/alcohol within the last 12 months?: Pt denies Risk to Others:   Prior Inpatient Therapy:   Prior Outpatient Therapy:    Level of Care:  OP  Hospital Course:    Shaquan Puerta was admitted forMDD,RECURRENT SEVERE  and crisis management.  Buspirone for depression, Neurontin for aggressive behavior and agitation, Trazodone for sleep.  Medical problems were identified and treated as needed.  Home medications were restarted as appropriate.  Improvement was monitored by observation and Carles Collet daily report of symptom reduction.   Emotional and mental status was monitored by daily self inventory reports completed by Carles Collet and clinical staff.         Beverly Ferner was evaluated by the treatment team for stability and plans for continued recovery upon discharge.  She was offered further treatment options upon discharge including Residential, Intensive Outpatient and Outpatient treatment. She will follow up with at [facility] (Dr. Casimiro Needle) for medication management and Tharon Aquas) for therapy.       Danyele Smejkal motivation was an integral factor for scheduling further treatment.  Employment, transportation, bed availability, health status, family support, and any pending legal issues were also considered during her hospital stay.  Upon completion of this admission the patient was both mentally and medically stable for discharge denying suicidal/homicidal ideation, auditory/visual/tactile hallucinations, delusional thoughts and paranoia.       Consults:  psychiatry  Significant Diagnostic Studies:  labs: UDS, ETOH, CBC/Diff, CMET, Urinalysis  Discharge Vitals:   Blood pressure 139/63, pulse 87, temperature 97.8 F (36.6 C), temperature source Oral, resp. rate 18, height 5' 5" (1.651 m), weight 91.627 kg (202 lb), SpO2 100 %. Body mass index is 33.61 kg/(m^2). Lab Results:   Results for orders placed or performed during the hospital encounter of 11/09/14 (from the past 72 hour(s))  Glucose, capillary     Status: Abnormal   Collection Time: 11/10/14 11:44 AM  Result Value Ref Range   Glucose-Capillary 207 (H) 70 - 99 mg/dL  Glucose, capillary     Status: Abnormal   Collection Time: 11/10/14  5:10 PM  Result Value Ref Range   Glucose-Capillary 192 (H) 70 - 99 mg/dL  Glucose, capillary     Status: Abnormal   Collection Time: 11/10/14  9:18 PM  Result Value Ref Range   Glucose-Capillary 156 (H) 70 - 99 mg/dL  Glucose, capillary     Status: Abnormal   Collection Time: 11/11/14  5:58 AM   Result Value Ref Range   Glucose-Capillary 159 (H) 70 - 99 mg/dL  Glucose, capillary     Status: Abnormal   Collection Time: 11/11/14 11:57 AM  Result Value Ref Range   Glucose-Capillary 249 (H) 70 - 99 mg/dL   Comment 1 Documented in Chart   Glucose, capillary     Status: Abnormal   Collection Time: 11/11/14  5:15 PM  Result Value Ref Range   Glucose-Capillary 237 (H) 70 - 99 mg/dL   Comment 1 Documented in Chart   Glucose, capillary     Status: Abnormal   Collection Time: 11/11/14  9:09 PM  Result Value Ref Range  Glucose-Capillary 200 (H) 70 - 99 mg/dL   Comment 1 Notify RN   Glucose, capillary     Status: Abnormal   Collection Time: 11/12/14  6:20 AM  Result Value Ref Range   Glucose-Capillary 171 (H) 70 - 99 mg/dL  Glucose, capillary     Status: Abnormal   Collection Time: 11/12/14 11:53 AM  Result Value Ref Range   Glucose-Capillary 216 (H) 70 - 99 mg/dL   Comment 1 Notify RN   Glucose, capillary     Status: Abnormal   Collection Time: 11/12/14  5:02 PM  Result Value Ref Range   Glucose-Capillary 181 (H) 70 - 99 mg/dL   Comment 1 Documented in Chart   Glucose, capillary     Status: Abnormal   Collection Time: 11/12/14  9:04 PM  Result Value Ref Range   Glucose-Capillary 215 (H) 70 - 99 mg/dL   Comment 1 Notify RN   Glucose, capillary     Status: Abnormal   Collection Time: 11/13/14  6:08 AM  Result Value Ref Range   Glucose-Capillary 145 (H) 70 - 99 mg/dL    Physical Findings: AIMS: Facial and Oral Movements Muscles of Facial Expression: None, normal Lips and Perioral Area: None, normal Jaw: None, normal Tongue: None, normal,Extremity Movements Upper (arms, wrists, hands, fingers): None, normal Lower (legs, knees, ankles, toes): None, normal, Trunk Movements Neck, shoulders, hips: None, normal, Overall Severity Severity of abnormal movements (highest score from questions above): None, normal Incapacitation due to abnormal movements: None, normal Patient's  awareness of abnormal movements (rate only patient's report): No Awareness, Dental Status Current problems with teeth and/or dentures?: No Does patient usually wear dentures?: No  CIWA:    COWS:      See Psychiatric Specialty Exam and Suicide Risk Assessment completed by Attending Physician prior to discharge.  Discharge destination:  Home  Is patient on multiple antipsychotic therapies at discharge:  No   Has Patient had three or more failed trials of antipsychotic monotherapy by history:  No    Recommended Plan for Multiple Antipsychotic Therapies: NA  Discharge Instructions    Discharge instructions    Complete by:  As directed   Take all of you medications as prescribed by your mental healthcare provider.  Report any adverse effects and reactions from your medications to your outpatient provider promptly. Do not engage in alcohol and or illegal drug use while on prescription medicines. In the event of worsening symptoms call the crisis hotline, 911, and or go to the nearest emergency department for appropriate evaluation and treatment of symptoms. Follow-up with your primary care provider for your medical issues, concerns and or health care needs.   Keep all scheduled appointments.  If you are unable to keep an appointment call to reschedule.  Let the nurse know if you will need medications before next scheduled appointment.            Medication List    STOP taking these medications        diazepam 5 MG tablet  Commonly known as:  VALIUM     gabapentin 800 MG tablet  Commonly known as:  NEURONTIN  Replaced by:  gabapentin 400 MG capsule     LORazepam 0.5 MG tablet  Commonly known as:  ATIVAN     sennosides-docusate sodium 8.6-50 MG tablet  Commonly known as:  SENOKOT-S      TAKE these medications      Indication   ACCU-CHEK FASTCLIX LANCET Kit  Use to  check blood sugars up to 5 times a day      ACCU-CHEK FASTCLIX LANCETS Misc  Use to Check Blood Sugars up  to 4 Times a Day. ICD-10 Code: E11.65      acetaminophen-codeine 300-60 MG per tablet  Commonly known as:  TYLENOL #4  take 1 tablet by mouth three times a day      albuterol (2.5 MG/3ML) 0.083% nebulizer solution  Commonly known as:  PROVENTIL  Take 3 mLs (2.5 mg total) by nebulization every 4 (four) hours as needed for wheezing or shortness of breath (cough).   Indication:  Acute Bronchospasm     aspirin 81 MG chewable tablet  Chew 81 mg by mouth daily.      azithromycin 250 MG tablet  Commonly known as:  ZITHROMAX  On first day take 2 tablets (500 mg) on the next four days take 1 tablet (250 mg) for bacterial infection  Start taking on:  11/14/2014      benzonatate 200 MG capsule  Commonly known as:  TESSALON  Take 1 capsule (200 mg total) by mouth 3 (three) times daily as needed for cough.   Indication:  Cough     BuPROPion HCl ER (XL) 450 MG Tb24  Take 450 mg by mouth every morning.   Indication:  Major Depressive Disorder     busPIRone 10 MG tablet  Commonly known as:  BUSPAR  Take 2 tablets (20 mg total) by mouth 3 (three) times daily at 8am, 2pm and bedtime. For anxiety   Indication:  Anxiety Disorder, Depression     clonazePAM 0.5 MG tablet  Commonly known as:  KLONOPIN  Take 1 tablet (0.5 mg total) by mouth 3 (three) times daily. For anxiety   Indication:  anxiety     cloNIDine 0.3 mg/24hr patch  Commonly known as:  CATAPRES - Dosed in mg/24 hr  Place 1 patch (0.3 mg total) onto the skin every 7 (seven) days.      COLACE PO  Take 2 tablets by mouth daily as needed (for constipation).      dexlansoprazole 60 MG capsule  Commonly known as:  DEXILANT  Take 1 capsule (60 mg total) by mouth daily. For reflux   Indication:  Gastroesophageal Reflux Disease     furosemide 40 MG tablet  Commonly known as:  LASIX  Take 1 tablet (40 mg total) by mouth every morning.      gabapentin 400 MG capsule  Commonly known as:  NEURONTIN  Take 2 tablets (800 mg) every  morning and 4 tablets (1,600 mg) every night   Indication:  Aggressive Behavior, Agitation     GAS-X PO  Take 2 tablets by mouth as needed (gas).      glucose blood test strip  Commonly known as:  ACCU-CHEK AVIVA PLUS  Check blood sugar up to 4 times a day as instructed   Indication:  High Blood Sugar     guaiFENesin 600 MG 12 hr tablet  Commonly known as:  MUCINEX  Take 1 tablet (600 mg total) by mouth 2 (two) times daily.   Indication:  Cough     insulin aspart 100 UNIT/ML FlexPen  Commonly known as:  NOVOLOG FLEXPEN  inject with meals and snacks up to 8 times a day as directed by your aviva glucometer.      Insulin Glargine 100 UNIT/ML Solostar Pen  Commonly known as:  LANTUS SOLOSTAR  Inject 51 Units into the skin at bedtime.  Insulin Pen Needle 32G X 4 MM Misc  Commonly known as:  BD PEN NEEDLE NANO U/F  USE TO TEST UP TO 5 TIMES A DAY ACCORDING TO CBG      isosorbide mononitrate 60 MG 24 hr tablet  Commonly known as:  IMDUR  Take 1 tablet (60 mg total) by mouth every morning.      loratadine 10 MG tablet  Commonly known as:  CLARITIN  Take 1 tablet (10 mg total) by mouth daily. For seasonal allergic rhinitis   Indication:  Hayfever     losartan 100 MG tablet  Commonly known as:  COZAAR  Take 50 mg by mouth every morning.      metoprolol succinate 25 MG 24 hr tablet  Commonly known as:  TOPROL-XL  Take 1 tablet (25 mg total) by mouth every morning.      ondansetron 4 MG tablet  Commonly known as:  ZOFRAN  Take 4 mg by mouth every 8 (eight) hours as needed for nausea.      rosuvastatin 10 MG tablet  Commonly known as:  CRESTOR  Take 1 tablet (10 mg total) by mouth at bedtime.      senna 8.6 MG Tabs tablet  Commonly known as:  SENOKOT  Take 4 tablets by mouth daily as needed (for constipation).      tiZANidine 2 MG tablet  Commonly known as:  ZANAFLEX  Take 1 tablet (2 mg total) by mouth every 8 (eight) hours as needed for muscle spasms.       traZODone 50 MG tablet  Commonly known as:  DESYREL  Take 1 tablet (50 mg total) by mouth at bedtime.   Indication:  Anxiety Disorder           Follow-up Information    Follow up with [facility] In 1 day.   Why:  Appt for med management with Dr. Casimiro Needle on  Wednesday, 11/13/14 at 4pm.    Contact information:   ATTN: Dr. Casimiro Needle  [address] [place], [place] [address] Phone: [phone] Fax: [phone]      Follow up with [facility]  In 2 days.   Why:  9:00AM Frankie therapy appt.    Contact information:   [address] [place], [place] [address] Phone: [phone] Fax: [phone]      Follow-up recommendations:  Activity:  As tolerated Diet:  As tolerated Other:  Follow up with [facility]  Comments:   Patient has been instructed to take medications as prescribed; and report adverse effects to outpatient provider.  Follow up with primary doctor for any medical issues and If symptoms recur report to nearest emergency or crisis hot line.    Total Discharge Time: Greater than 30 Minutes  Signed: Earleen Newport, FNP-BC 11/13/2014, 10:05 AM

## 2014-11-13 NOTE — Progress Notes (Signed)
  [facility]  Will you be returning to the same living situation after discharge:  Yes,  home with husband At discharge, do you have transportation home?: Yes,  husband coming at 11:30AM  Do you have the ability to pay for your medications: Yes,  Private insurance  Release of information consent forms completed and submitted to Medical Records by CSW. Patient to Follow up at: Follow-up Information    Follow up with [facility] In 1 day.   Why:  Appt for med management with Dr. Donell BeersPlovsky on  Wednesday, 11/13/14 at 4pm.    Contact information:   ATTN: Dr. Donell BeersPlovsky  [address] [place], [place] [address] Phone: [phone] Fax: [phone]      Follow up with [facility]  In 2 days.   Why:  9:00AM Frankie therapy appt.    Contact information:   [address] [place], [place] [address] Phone: [phone] Fax: [phone]      Patient denies SI/HI: Yes,  during group/self report.     Safety Planning and Suicide Prevention discussed: Yes,  SPE completed with pt's husband and with pt. SPI pamphlet also provided to pt.   Has patient been referred to the Quitline?: N/A patient is not a smoker  Smart, Lebron QuamHeather LCSWA  11/13/2014, 9:52 AM

## 2014-11-13 NOTE — Telephone Encounter (Signed)
Pt calls and leaves message that she has questions about her disch meds, called back, no answer, left message

## 2014-11-13 NOTE — [facility] Suicide Risk Assessment (Signed)
[facility]   Demographic Factors:  NA  Total Time spent with patient: 30 minutes  Musculoskeletal: Strength & Muscle Tone: within normal limits Gait & Station: normal Patient leans: N/A  Psychiatric Specialty Exam: Physical Exam  Review of Systems  Constitutional: Negative.   HENT: Negative.   Eyes: Negative.   Respiratory: Positive for cough (improving).   Cardiovascular: Negative.   Gastrointestinal: Negative.   Genitourinary: Negative.   Musculoskeletal: Negative.   Skin: Negative.   Neurological: Negative.   Psychiatric/Behavioral: Negative for depression, suicidal ideas, hallucinations and substance abuse.    Blood pressure 139/63, pulse 87, temperature 97.8 F (36.6 C), temperature source Oral, resp. rate 18, height 5\' 5"  (1.651 m), weight 91.627 kg (202 lb), SpO2 100 %.Body mass index is 33.61 kg/(m^2).  General Appearance: Fairly Groomed  Patent attorneyye Contact::  Fair  Speech:  Clear and Coherent  Volume:  Normal  Mood:  Euthymic  Affect:  Congruent  Thought Process:  Coherent  Orientation:  Full (Time, Place, and Person)  Thought Content:  WDL  Suicidal Thoughts:  No  Homicidal Thoughts:  No  Memory:  Immediate;   Fair Recent;   Fair Remote;   Fair  Judgement:  Fair  Insight:  Fair  Psychomotor Activity:  Normal  Concentration:  Fair  Recall:  FiservFair  Fund of Knowledge:Fair  Language: Fair  Akathisia:  No  Handed:  Right  AIMS (if indicated):     Assets:  Communication Skills Desire for Improvement  Sleep:  Number of Hours: 5.5  Cognition: WNL  ADL's:  Intact   Have you used any form of tobacco in the last 30 days? (Cigarettes, Smokeless Tobacco, Cigars, and/or Pipes): No  Has this patient used any form of tobacco in the last 30 days? (Cigarettes, Smokeless Tobacco, Cigars, and/or Pipes) No  Mental Status Per Nursing Assessment::   On Admission:  Suicidal ideation indicated by patient  Current Mental Status by  Physician: Patient is alert ,ox4, denies SI/HI/AH/VH  Loss Factors: NA  Historical Factors: Impulsivity  Risk Reduction Factors:   Positive social support  Continued Clinical Symptoms:  Previous Psychiatric Diagnoses and Treatments Medical Diagnoses and Treatments/Surgeries  Cognitive Features That Contribute To Risk:  Polarized thinking    Suicide Risk:  Minimal: No identifiable suicidal ideation.  Patients presenting with no risk factors but with morbid ruminations; may be classified as minimal risk based on the severity of the depressive symptoms  Principal Problem: MDD (major depressive disorder), recurrent severe, without psychosis    Discharge Diagnoses: MDD (major depressive disorder), recurrent severe, without psychosis ( improved) Patient Active Problem List    Plan Of Care/Follow-up recommendations:  Activity:  NO RESTRICTIONS Diet: carb modified  Is patient on multiple antipsychotic therapies at discharge:  No   Has Patient had three or more failed trials of antipsychotic monotherapy by history:  No  Recommended Plan for Multiple Antipsychotic Therapies: NA    [name] md 11/13/2014, 9:21 AM

## 2014-11-18 NOTE — Progress Notes (Signed)
Patient Discharge Instructions:  Next Level Care Provider Has Access to the EMR, 11/18/14  [facility] access.  Jerelene ReddenSheena E Panguitch, 11/18/2014, 1:36 PM

## 2014-11-19 ENCOUNTER — Ambulatory Visit (HOSPITAL_COMMUNITY)
Admission: RE | Admit: 2014-11-19 | Discharge: 2014-11-19 | Disposition: A | Discharge status: Home / Self Care | Payer: Medicare Other | Source: Ambulatory Visit | Attending: Internal Medicine | Admitting: Internal Medicine

## 2014-11-19 ENCOUNTER — Ambulatory Visit (INDEPENDENT_AMBULATORY_CARE_PROVIDER_SITE_OTHER): Payer: Medicare Other | Admitting: Internal Medicine

## 2014-11-19 ENCOUNTER — Encounter: Payer: Self-pay | Admitting: Internal Medicine

## 2014-11-19 VITALS — BP 125/86 | HR 71 | Temp 98.0°F | Wt 209.6 lb

## 2014-11-19 DIAGNOSIS — R05 Cough: Secondary | ICD-10-CM

## 2014-11-19 DIAGNOSIS — R059 Cough, unspecified: Secondary | ICD-10-CM

## 2014-11-19 DIAGNOSIS — R918 Other nonspecific abnormal finding of lung field: Secondary | ICD-10-CM | POA: Diagnosis not present

## 2014-11-19 DIAGNOSIS — I5032 Chronic diastolic (congestive) heart failure: Secondary | ICD-10-CM

## 2014-11-19 DIAGNOSIS — I10 Essential (primary) hypertension: Secondary | ICD-10-CM

## 2014-11-19 MED ORDER — ALBUTEROL SULFATE HFA 108 (90 BASE) MCG/ACT IN AERS
2.0000 | INHALATION_SPRAY | Freq: Four times a day (QID) | RESPIRATORY_TRACT | Status: DC | PRN
Start: 2014-11-19 — End: 2016-12-06

## 2014-11-19 MED ORDER — BENZONATATE 200 MG PO CAPS: 200.0000 mg | ORAL_CAPSULE | Freq: Three times a day (TID) | ORAL | Status: DC | PRN

## 2014-11-19 NOTE — Progress Notes (Signed)
Patient ID: [name], female   DOB: [birth year], 64 y.o.   MRN: [id number]    Subjective:   Patient ID: [name] female   DOB: [birth year] 64 y.o.   MRN: [id number]  HPI: [name] is a 64 y.o. pleasant woman with past medical history of hypertension, hyperlipidemia, insulin-dependent Type 2 DM, chronic grade 1 diastolic CHF, vitamin T03 deficiency, chronic low back pain, anxiety/depression, CAD, CKD Stage 3, chronic microcytic anemia, OSA, and GERD who presents with chief complaint of cough.   She was [facility] 1/16-1/20/16 with stable mood since discharge and complaint with her medications. She reports developing a cough the day after she was hospitalized that has been hacking and non-productive in nature without fever, chills, wheezing, dyspnea, or hemoptysis.  She reports being unable to sleep at night due to the cough and vomited due to coughing so much. She was thought to have bronchitis (no CXR obtained) and given azithromycin to take for 5 days which she just recently completed. She was also given a few albuterol nebulizer treatments which helped and was discharged on albuterol nebulizer but does not have a machine at home. She has never used albuterol inhaler in the past and denies having cough, dyspnea, or wheezing in the past. She was also given tessalon and mucinex which have not helped. Her cough continues to persist and is now ongoing for 1 week and 2 days. She has never had pulmonary function testing and denies history of asthma or COPD. She is a past tobacco smoker 0.5 ppd for approximately 25 years (quit in 2004). Her last chest xray on 09/19/14 revealed mild bronchitis changes. She denies history of allergies, rhinorrhea, or PND. She has history of GERD and compliant with taking dexilant daily.   She has also been having bilateral lower extremity swelling and bilateral hand swelling since discharge. She has history of  chronic grade 1 diastolic CHF which she is unaware of. She is compliant with taking lasix 40 mg daily and received this while she was hospitalized. She denies dyspnea with exertion, orthopnea from baseline 2 pillows, recent weight gain, or dietary indiscretion. She reports decreased urinary output for the past 2 months with no other urinary symptoms. Her renal function was stable during recent hospitalization with Cr near baseline (CKD Stage 3).   She is compliant with taking lasix, losartan, toprol-xl, and clonidine patch for hypertension.  She denies chest pain, blurry vision, or lightheadedness.       Past Medical History  Diagnosis Date  . Hypertension   . Peripheral neuropathy   . Cervicalgia   . Low back pain syndrome   . Hyperlipidemia   . Vitamin D deficiency   . Vitamin B12 deficiency   . Iliotibial band syndrome   . Anemia   . Borderline personality disorder   . Nonorganic psychosis   . GERD (gastroesophageal reflux disease)   . Diabetes mellitus   . Depression   . Diabetic gastroparesis   . CAD (coronary artery disease)     Catherization 11/18/09>nonobstructive CAD , normal LV function, EF 65%  . Health maintenance examination     Colonoscopy 2009> normal; Diabetic eye exam 12/2008. No diabetic retinopathy; Mammogram 11/10> No evidence of malignancy, DXA 03/14/13 : normal  . Headache(784.0)   . Arthritis   . SBO (small bowel obstruction) 01/09/2013  . Diastolic CHF     Grade I, on Echo 06/2013, EF 55-60%  . Sleep apnea  does not wear CPAP   . Diabetes mellitus, type II   . Wears glasses   . Wears dentures     upper  . Gout   . Right carpal tunnel syndrome 09/13/2014   Current Outpatient Prescriptions  Medication Sig Dispense Refill  . ACCU-CHEK FASTCLIX LANCETS MISC Use to Check Blood Sugars up to 4 Times a Day. ICD-10 Code: E11.65 102 each 6  . acetaminophen-codeine (TYLENOL #4) 300-60 MG per tablet take 1 tablet by mouth three times a day 90 tablet 1  .  albuterol (PROVENTIL) (2.5 MG/3ML) 0.083% nebulizer solution Take 3 mLs (2.5 mg total) by nebulization every 4 (four) hours as needed for wheezing or shortness of breath (cough). 75 mL 12  . aspirin 81 MG chewable tablet Chew 81 mg by mouth daily.    Marland Kitchen azithromycin (ZITHROMAX) 250 MG tablet On first day take 2 tablets (500 mg) on the next four days take 1 tablet (250 mg) for bacterial infection 6 each 0  . benzonatate (TESSALON) 200 MG capsule Take 1 capsule (200 mg total) by mouth 3 (three) times daily as needed for cough. 20 capsule 0  . BuPROPion HCl ER, XL, 450 MG TB24 Take 450 mg by mouth every morning. 30 tablet 5  . busPIRone (BUSPAR) 10 MG tablet Take 2 tablets (20 mg total) by mouth 3 (three) times daily at 8am, 2pm and bedtime. For anxiety 30 tablet 0  . clonazePAM (KLONOPIN) 0.5 MG tablet Take 1 tablet (0.5 mg total) by mouth 3 (three) times daily. For anxiety 90 tablet 4  . cloNIDine (CATAPRES - DOSED IN MG/24 HR) 0.3 mg/24hr patch Place 1 patch (0.3 mg total) onto the skin every 7 (seven) days. 4 patch 11  . dexlansoprazole (DEXILANT) 60 MG capsule Take 1 capsule (60 mg total) by mouth daily. For reflux 30 capsule   . Docusate Sodium (COLACE PO) Take 2 tablets by mouth daily as needed (for constipation).     . furosemide (LASIX) 40 MG tablet Take 1 tablet (40 mg total) by mouth every morning. 30 tablet 3  . gabapentin (NEURONTIN) 400 MG capsule Take 2 tablets (800 mg) every morning and 4 tablets (1,600 mg) every night 180 capsule 0  . glucose blood (ACCU-CHEK AVIVA PLUS) test strip Check blood sugar up to 4 times a day as instructed 125 each 5  . guaiFENesin (MUCINEX) 600 MG 12 hr tablet Take 1 tablet (600 mg total) by mouth 2 (two) times daily. 28 tablet 0  . insulin aspart (NOVOLOG FLEXPEN) 100 UNIT/ML FlexPen inject with meals and snacks up to 8 times a day as directed by your aviva glucometer. (Patient taking differently: Inject into the skin. inject with meals and snacks up to 8 times  a day as directed by your aviva glucometer. Sliding scale) 30 mL 11  . Insulin Glargine (LANTUS SOLOSTAR) 100 UNIT/ML Solostar Pen Inject 51 Units into the skin at bedtime. 15 mL 11  . Insulin Pen Needle (BD PEN NEEDLE NANO U/F) 32G X 4 MM MISC USE TO TEST UP TO 5 TIMES A DAY ACCORDING TO CBG 100 each 4  . isosorbide mononitrate (IMDUR) 60 MG 24 hr tablet Take 1 tablet (60 mg total) by mouth every morning. 30 tablet 6  . Lancets Misc. (ACCU-CHEK FASTCLIX LANCET) KIT Use to check blood sugars up to 5 times a day 1 kit 2  . loratadine (CLARITIN) 10 MG tablet Take 1 tablet (10 mg total) by mouth daily. For seasonal allergic rhinitis  30 tablet 0  . losartan (COZAAR) 100 MG tablet Take 50 mg by mouth every morning.    . metoprolol succinate (TOPROL-XL) 25 MG 24 hr tablet Take 1 tablet (25 mg total) by mouth every morning. 30 tablet 6  . ondansetron (ZOFRAN) 4 MG tablet Take 4 mg by mouth every 8 (eight) hours as needed for nausea.     . rosuvastatin (CRESTOR) 10 MG tablet Take 1 tablet (10 mg total) by mouth at bedtime. 30 tablet 11  . senna (SENOKOT) 8.6 MG TABS Take 4 tablets by mouth daily as needed (for constipation).     . sertraline (ZOLOFT) 50 MG tablet Take 1 tablet (50 mg total) by mouth daily. 30 tablet 5  . Simethicone (GAS-X PO) Take 2 tablets by mouth as needed (gas).    Marland Kitchen tiZANidine (ZANAFLEX) 2 MG tablet Take 1 tablet (2 mg total) by mouth every 8 (eight) hours as needed for muscle spasms. 90 tablet 1  . traZODone (DESYREL) 50 MG tablet Take 1 tablet (50 mg total) by mouth at bedtime. 30 tablet 0   No current facility-administered medications for this visit.   Family History  Problem Relation Age of Onset  . Diabetes Mother   . Hypertension Mother   . Hyperlipidemia Mother   . Arthritis Mother   . Depression Mother   . Heart disease Father   . Diabetes Sister   . Diabetes Brother   . Heart disease Brother   . Heart disease Paternal Grandmother   . Seizures Brother    History    Social History  . Marital Status: Married    Spouse Name: N/A    Number of Children: N/A  . Years of Education: N/A   Social History Main Topics  . Smoking status: Former Smoker    Types: Cigarettes    Quit date: 10/25/2002  . Smokeless tobacco: Never Used  . Alcohol Use: No  . Drug Use: No  . Sexual Activity: Not Currently   Other Topics Concern  . None   Social History Narrative   Review of Systems: Review of Systems  Constitutional: Negative for fever, chills and weight loss.  Respiratory: Positive for cough (dry and hacking). Negative for hemoptysis, sputum production, shortness of breath and wheezing.   Cardiovascular: Positive for leg swelling (bilateral ). Negative for chest pain, orthopnea and PND.       Bilateral hand swelling  Gastrointestinal: Positive for constipation. Negative for nausea, vomiting, abdominal pain and diarrhea.  Genitourinary: Negative for dysuria, urgency, frequency and hematuria.       Decreased urinary output for 2 months  Neurological: Positive for headaches (due to coughing). Negative for dizziness.  Psychiatric/Behavioral: Negative for depression and suicidal ideas.     Objective:  Physical Exam: Filed Vitals:   11/19/14 0931  BP: 125/86  Pulse: 71  Temp: 98 F (36.7 C)  TempSrc: Oral  Weight: 209 lb 9.6 oz (95.074 kg)  SpO2: 97%   Physical Exam  Constitutional: She is oriented to person, place, and time. She appears well-developed and well-nourished. No distress.  HENT:  Head: Normocephalic and atraumatic.  Right Ear: External ear normal.  Left Ear: External ear normal.  Nose: Nose normal.  Mouth/Throat: Oropharynx is clear and moist. No oropharyngeal exudate.  Eyes: Conjunctivae and EOM are normal. Pupils are equal, round, and reactive to light. Right eye exhibits no discharge. Left eye exhibits no discharge. No scleral icterus.  Neck: Normal range of motion. Neck supple.  Cardiovascular: Normal  rate and regular rhythm.    Pulmonary/Chest: Effort normal. No respiratory distress. She has no wheezes. She has no rales.  Decreased breath sounds at bases  Abdominal: Soft. Bowel sounds are normal. She exhibits no distension. There is no tenderness. There is no rebound and no guarding.  Musculoskeletal: Normal range of motion. She exhibits edema (trace +1 biilateral LE ). She exhibits no tenderness.  Neurological: She is alert and oriented to person, place, and time.  Skin: Skin is warm and dry. No rash noted. She is not diaphoretic. No erythema. No pallor.  Psychiatric: She has a normal mood and affect. Her behavior is normal. Judgment and thought content normal.    Assessment & Plan:   Please see problem list for problem-based assessment and plan

## 2014-11-19 NOTE — Assessment & Plan Note (Signed)
Assessment: Pt with well-controlled hypertension compliant with four-class (diuretic, BB, ARB, nitrate) anti-hypertensive therapy who presents with blood pressure of 125/86.  Plan:  -Due to increasing swelling since hospitalization, pt instructed to increase furosemide from 40 mg daily to BID until swelling improves and then resume 40 mg daily  -Continue imdur 60 mg daily, metoprolol succinate 25 mg daily, and losartan 50 mg daily  -Last CMP on 11/08/14 with stable CKD Stage 3

## 2014-11-19 NOTE — Assessment & Plan Note (Addendum)
Assessment: Pt with 9-day history of non-productive cough that began during recent hospitalization treated with 5-day course of antibiotics who presents with persistent cough most likely due to post-viral pneumonitis.   Plan:  -Obtain SpO2 with ambulation ---> 98% on room air  -Obtain CXR to assess for pneumonia---->Minimal interstitial prominence noted in the lung bases suggesting mild pneumonitis -Refer for pulmonary function testing in setting of past tobacco abuse to assess for COPD -Refill benzonatate 200 mg TID PRN cough  -Prescribe albuterol inhaler 2 puffs Q 6 hr PRN acute bronchospasm -No indication for albuterol nebulizer machine at this time -Pt instructed to return if symptoms do not improve to prescribe short course of corticosteroid for pneumonitis

## 2014-11-19 NOTE — Assessment & Plan Note (Addendum)
Assessment: Pt with chronic diastolic CHF with last 2D-echo on 07/06/13 with grade 1 diastolic dysfunction who presents with worsening edema.   Plan:  -Wt unchanged at 209 from hospital discharge -Due to increasing swelling since hospitalization, pt instructed to increase furosemide from 40 mg daily to BID until swelling improves and then resume 40 mg daily  -Continue imdur 60 mg daily, metoprolol succinate 25 mg daily, and losartan 50 mg daily  -Last CMP on 11/08/14 with stable CKD Stage 3

## 2014-11-19 NOTE — Patient Instructions (Signed)
-  Will get a chest xray and refer you for breathing test  -I reordered your tessaslon pearls  -Start using albuterol inhaler as needed for wheezing and shortness of breath -Increase your lasix to 40 mg twice a day for the next few days and see if your swelling improves then go back to 40 mg daily -Please come back if your cough does not improve   General Instructions:   Please bring your medicines with you each time you come to clinic.  Medicines may include prescription medications, over-the-counter medications, herbal remedies, eye drops, vitamins, or other pills.   Progress Toward Treatment Goals:  Treatment Goal 08/14/2014  Hemoglobin A1C -  Blood pressure deteriorated    Self Care Goals & Plans:  Self Care Goal 11/07/2014  Manage my medications bring my medications to every visit; refill my medications on time; take my medicines as prescribed  Monitor my health keep track of my blood glucose; bring my glucose meter and log to each visit  Eat healthy foods drink diet soda or water instead of juice or soda; eat more vegetables; eat foods that are low in salt; eat baked foods instead of fried foods  Be physically active find an activity I enjoy  Meeting treatment goals -    Home Blood Glucose Monitoring 01/02/2014  Check my blood sugar 3 times a day  When to check my blood sugar before breakfast; at bedtime; before lunch     Care Management & Community Referrals:  Referral 11/15/2013  Referrals made for care management support diabetes educator  Referrals made to community resources weight management; exercise/physical therapy; nutrition

## 2014-11-20 NOTE — Progress Notes (Signed)
Case discussed with Dr. Johna Rolesabbani soon after the resident saw the patient.  We reviewed the resident's history and exam and pertinent patient test results.  I agree with the assessment, diagnosis, and plan of care documented in the resident's note with the following modification:  Acute cough likely secondary to a viral bronchitis with a delayed resolution.  If it persists past 8 weeks we should consider discontinuance of the ARB and reassessment of effectiveness of therapy for GERD.

## 2014-11-22 ENCOUNTER — Encounter (HOSPITAL_COMMUNITY): Payer: Self-pay | Admitting: Clinical

## 2014-11-22 ENCOUNTER — Ambulatory Visit (INDEPENDENT_AMBULATORY_CARE_PROVIDER_SITE_OTHER): Payer: 59 | Admitting: Clinical

## 2014-11-22 DIAGNOSIS — F331 Major depressive disorder, recurrent, moderate: Secondary | ICD-10-CM | POA: Diagnosis not present
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
## 2014-11-26 ENCOUNTER — Ambulatory Visit: Payer: Medicare Other

## 2014-11-28 ENCOUNTER — Ambulatory Visit: Payer: Medicare Other | Attending: Internal Medicine | Admitting: Physical Therapy

## 2014-11-28 DIAGNOSIS — M545 Low back pain, unspecified: Secondary | ICD-10-CM

## 2014-11-28 DIAGNOSIS — M6281 Muscle weakness (generalized): Secondary | ICD-10-CM | POA: Diagnosis not present

## 2014-11-28 NOTE — Therapy (Signed)
[facility] [address] [place], [place], [address] Phone: [phone]   Fax:  [phone]  Physical Therapy Evaluation  Patient Details  Name: [name] MRN: [id number] Date of Birth: 03-27-1951 Referring Provider:  Dionne Ano, MD  Encounter Date: 11/28/2014      PT End of Session - 11/28/14 1644    Visit Number 1   Number of Visits 12   Date for PT Re-Evaluation 01/27/15   PT Start Time 1415   PT Stop Time 1453   PT Time Calculation (min) 38 min   Activity Tolerance Patient tolerated treatment well   Behavior During Therapy [facility] for tasks assessed/performed      Past Medical History  Diagnosis Date  . Hypertension   . Peripheral neuropathy   . Cervicalgia   . Low back pain syndrome   . Hyperlipidemia   . Vitamin D deficiency   . Vitamin B12 deficiency   . Iliotibial band syndrome   . Anemia   . Borderline personality disorder   . Nonorganic psychosis   . GERD (gastroesophageal reflux disease)   . Diabetes mellitus   . Depression   . Diabetic gastroparesis   . CAD (coronary artery disease)     Catherization 11/18/09>nonobstructive CAD , normal LV function, EF 65%  . Health maintenance examination     Colonoscopy 2009> normal; Diabetic eye exam 12/2008. No diabetic retinopathy; Mammogram 11/10> No evidence of malignancy, DXA 03/14/13 : normal  . Headache(784.0)   . Arthritis   . SBO (small bowel obstruction) 01/09/2013  . Diastolic CHF     Grade I, on Echo 06/2013, EF 55-60%  . Sleep apnea     does not wear CPAP   . Diabetes mellitus, type II   . Wears glasses   . Wears dentures     upper  . Gout   . Right carpal tunnel syndrome 09/13/2014    Past Surgical History  Procedure Laterality Date  . Abdominal hysterectomy    . Hand surgery  1992    rt  . Bowel resection N/A 01/10/2013    Procedure: SMALL BOWEL RESECTION;  Surgeon: Lodema Pilot, DO;  Location: [facility] OR;  Service: General;  Laterality: N/A;   . Lysis of adhesion N/A 01/10/2013    Procedure: LYSIS OF ADHESION;  Surgeon: Lodema Pilot, DO;  Location: [facility] OR;  Service: General;  Laterality: N/A;  . Laparotomy N/A 01/10/2013    Procedure: EXPLORATORY LAPAROTOMY;  Surgeon: Lodema Pilot, DO;  Location: [facility] OR;  Service: General;  Laterality: N/A;  . Incisional hernia repair N/A 03/01/2014    Procedure: LAPAROSCOPIC INCISIONAL HERNIA;  Surgeon: Axel Filler, MD;  Location: [facility];  Service: General;  Laterality: N/A;  . Insertion of mesh N/A 03/01/2014    Procedure: INSERTION OF MESH;  Surgeon: Axel Filler, MD;  Location: [facility];  Service: General;  Laterality: N/A;  . Hernia repair  03/01/14    Lap incisional hernia repair w/mesh  . Appendectomy    . Carpal tunnel release Right 09/13/2014    Procedure: RIGHT WRIST CARPAL TUNNEL RELEASE;  Surgeon: Eulas Post, MD;  Location: [facility];  Service: Orthopedics;  Laterality: Right;  . Steriod injection Left 09/13/2014    Procedure: STEROID INJECTION LEFT HAND;  Surgeon: Eulas Post, MD;  Location: [facility];  Service: Orthopedics;  Laterality: Left;    There were no vitals taken for this visit.  Visit Diagnosis:  Midline low back pain without  sciatica - Plan: PT plan of care cert/re-cert  Muscle weakness (generalized) - Plan: PT plan of care cert/re-cert      Subjective Assessment - 11/28/14 1418    Symptoms Pt is a 64 y/o female who presents to OPPT for low back and mid back pain.  Pt reports difficulty with ADLs (especially with laundry, dishes, etc).  Pt reports injections and medications have been unsuccessful.   Pertinent History see epic snapshot   Limitations Sitting;Standing;Walking;House hold activities   How long can you sit comfortably? 30 min; longer in a soft chair   How long can you stand comfortably? 30 min   How long can you walk comfortably? 1/4 mile   Diagnostic tests xrays: OA of spine   Currently in Pain? Yes   Pain Score 4     Pain Location Back   Pain Orientation Mid;Lower   Pain Descriptors / Indicators Aching   Pain Type Chronic pain   Pain Onset More than a month ago   Pain Frequency Intermittent   Aggravating Factors  ADLs, cooking, cleaning, prolonged static positioning   Pain Relieving Factors sitting still, lying in bed          [facility] PT Assessment - 11/28/14 1422    Assessment   Medical Diagnosis low back pain   Onset Date --  2013   Next MD Visit March 2016   Prior Therapy "a long time ago but it wasn't that bad"   Precautions   Precautions None   Restrictions   Weight Bearing Restrictions No   Balance Screen   Has the patient fallen in the past 6 months Yes   How many times? 2   Has the patient had a decrease in activity level because of a fear of falling?  No   Is the patient reluctant to leave their home because of a fear of falling?  No   Home Environment   Living Enviornment Private residence   Living Arrangements Spouse/significant other   Available Help at Discharge Family;Available 24 hours/day   Type of Home Apartment   Home Access Stairs to enter   Entrance Stairs-Number of Steps 5   Entrance Stairs-Rails Left   Home Layout One level   Prior Function   Level of Independence Independent with basic ADLs;Independent with gait;Independent with transfers   Vocation On disability   Leisure n/a   Cognition   Overall Cognitive Status Within Functional Limits for tasks assessed   Observation/Other Assessments   Focus on Therapeutic Outcomes (FOTO)  40 (60% limited; predicted 44% limited)   Posture/Postural Control   Posture/Postural Control Postural limitations   Postural Limitations Rounded Shoulders;Forward head;Decreased lumbar lordosis   AROM   Lumbar Flexion 27  pain   Lumbar Extension 14  pain   Lumbar - Right Side Bend 19  pain   Lumbar - Left Side Bend 21  pain   Strength   Overall Strength Comments weak core noted   Right Hip Flexion 3+/5   Right Hip  Extension 3-/5   Right Hip ABduction 3+/5   Left Hip Flexion 3+/5   Left Hip Extension 3-/5   Left Hip ABduction 3+/5   Right Knee Flexion 3+/5   Right Knee Extension 4/5   Left Knee Flexion 4-/5   Left Knee Extension 4/5   Right Ankle Dorsiflexion 5/5   Left Ankle Dorsiflexion 5/5   Palpation   Palpation muscle tightness and tenderness noted bil parapinals; L > R; tight hamstrings bil  PT Education - 12-12-2014 1643    Education provided Yes   Education Details clinical findings; POC and goals of care (extensive education with pt)   Person(s) Educated Patient   Methods Explanation   Comprehension Verbalized understanding             PT Long Term Goals - 12/12/2014 1648    PT LONG TERM GOAL #1   Title independent with HEP (01/09/15)   Time 6   Period Weeks   Status New   PT LONG TERM GOAL #2   Title improve lumbar ROM (flex, ext, and bil sidebending) by at least 5 degrees for improved mobility and without increase in pain (01/09/15)   Time 6   Period Weeks   Status New   PT LONG TERM GOAL #3   Title verbalize understanding of posture/body mechanics to reduce risk of reinjury (01/09/15)   Time 6   Period Weeks   Status New   PT LONG TERM GOAL #4   Title tolerate ability to amb/stand > 45 min without increase in pain (01/09/15)   Time 6   Period Weeks   Status New               Plan - 12-12-2014 1644    Clinical Impression Statement Pt presents to OPPT with chronic low back pain and demonstrates lower extremity and core weakness.  Will benefit from PT to maximize function and decrease pain.  Pt also expressed ongoing issues with depression/anxiety and feels this is contributing to pain.  Educated on benefits of exercise and pt verbalized understanding.   Pt will benefit from skilled therapeutic intervention in order to improve on the following deficits Postural dysfunction;Decreased strength;Decreased range of  motion;Decreased endurance;Decreased activity tolerance;Decreased mobility;Improper body mechanics;Pain;Impaired flexibility   Rehab Potential Good   PT Frequency 2x / week   PT Duration 6 weeks   PT Treatment/Interventions ADLs/Self Care Home Management;Cryotherapy;Ultrasound;Electrical Stimulation;Gait training;Patient/family education;Therapeutic exercise;Stair training;Functional mobility training;Neuromuscular re-education;Manual techniques;Therapeutic activities;Moist Heat   PT Next Visit Plan core and hip stability exercises, lumbar and hamstring stretches   Consulted and Agree with Plan of Care Patient          G-Codes - 2014-12-12 1651    Functional Assessment Tool Used FOTO 60% limited; predicted 44% limited   Functional Limitation Mobility: Walking and moving around   Mobility: Walking and Moving Around Current Status ([id number]) At least 40 percent but less than 60 percent impaired, limited or restricted   Mobility: Walking and Moving Around Goal Status [phone]) At least 40 percent but less than 60 percent impaired, limited or restricted       Problem List Patient Active Problem List   Diagnosis Date Noted  . Cough   . MDD (major depressive disorder), recurrent severe, without psychosis 11/11/2014  . Major depressive disorder, recurrent severe without psychotic features   . Left wrist pain 09/13/2014  . Right carpal tunnel syndrome 09/13/2014  . Degenerative joint disease involving multiple joints on both sides of body (right foot pain) 08/14/2014  . Spondylosis of lumbar region without myelopathy or radiculopathy 08/06/2014  . Lumbosacral spondylosis without myelopathy 07/09/2014  . TMJ syndrome 06/18/2014  . TMJ (dislocation of temporomandibular joint) 06/04/2014  . Chronic low back pain 04/15/2014  . Periumbilical hernia 02/02/2014  . Hot flashes 01/02/2014  . Diastolic CHF 07/11/2013  . Major depressive disorder, recurrent episode, unspecified 06/20/2013  .  Fibromyalgia syndrome 06/18/2013  . Abdominal pain, epigastric 03/29/2013  . Myalgia 12/21/2012  .  Chronic chest pain 11/21/2012  . Carpal tunnel syndrome, bilateral 07/25/2012  . Tension headache, chronic 05/24/2012  . Iron deficiency anemia 09/22/2011  . Depression 07/14/2011  . Anxiety 06/02/2011  . CKD (chronic kidney disease) stage 3, GFR 30-59 ml/min 01/22/2011  . Lumbar spondylosis with myelopathy 11/20/2010  . Coronary atherosclerosis 03/10/2010  . Dyslipidemia 12/11/2008  . VITAMIN D DEFICIENCY 11/26/2008  . Uncontrolled type 2 diabetes mellitus with insulin therapy 05/25/2007  . PERIPHERAL NEUROPATHY 05/25/2007  . Essential hypertension 05/25/2007  . GERD 05/25/2007  . SYMPTOM, APNEA, SLEEP NOS 05/25/2007   Clarita CraneStephanie F Westin Knotts, PT, DPT 11/28/2014 4:54 PM  [facility] [address] [place], [place], [id number] Phone: [phone]   Fax:  [phone]

## 2014-12-04 ENCOUNTER — Telehealth (HOSPITAL_COMMUNITY): Payer: Self-pay | Admitting: *Deleted

## 2014-12-04 ENCOUNTER — Ambulatory Visit (INDEPENDENT_AMBULATORY_CARE_PROVIDER_SITE_OTHER): Payer: 59 | Admitting: Clinical

## 2014-12-04 DIAGNOSIS — F331 Major depressive disorder, recurrent, moderate: Secondary | ICD-10-CM

## 2014-12-04 NOTE — Telephone Encounter (Signed)
Dr. Donell BeersPlovsky,   Patient was here today seeing [name].  Patient request that her prescriptions of: Buspirone 10 mg, Clonazepam 0.5 mg, and Zoloft 50 mg [facility] as [facility] prescriptions because it will be less expensive. Patient also requested refill on Trazadone. Please advise?  Thank you

## 2014-12-05 ENCOUNTER — Encounter (HOSPITAL_COMMUNITY): Payer: Self-pay | Admitting: Clinical

## 2014-12-05 NOTE — Progress Notes (Signed)
THERAPIST PROGRESS NOTE  Session Time: 2:32- 3:30  Participation Level: Active  Behavioral Response: Casual and NeatAlertDepressed  Type of Therapy: Individual Therapy  Treatment Goals addressed: improve psychiatric symptoms,   Interventions: Motivational Interviewing and Other: Grounding and mindfulness techniques  Summary: [name] is a 64 y.o. female who presents with Major depressive disorder, recurrent episode, moderate .   Suicidal/Homicidal: Nowithout intent/plan  Therapist Response: [name] met with clinician for an individual session. She discussed her psychiatric symptoms and her current life events. [name] shared that about her depression since our first meeting. She discussed her living situation with her husband and their plans to move. [name] reported that while her church friends have called to check on her she has been unable to bring herself to visit with anyone. [name] shared about her grandson, [name] (age 54) . She shared that he is both a joy and that she her energy level is low so that sometimes she feels overwhelmed taking care of him. She teared up talking about the possibility of him becoming more independent and not wanting to spend as much time with her. Clinician introduced grounding techniques and client and clinician practiced two of them together. Client and clinician discussed the purpose and application of the techniques. [name] agreed to practice the techniques until next session. [name] And clinician discussed a gratitude journal and [name] agreed to keep one until next session.  Plan: Return again in 1-2 weeks.  Diagnosis: Axis I:  Major depressive disorder, recurrent episode, moderate       Keene Gilkey A, LCSW 12/05/2014

## 2014-12-09 ENCOUNTER — Ambulatory Visit: Payer: Medicare Other | Admitting: Rehabilitation

## 2014-12-10 NOTE — Telephone Encounter (Signed)
Patient left a message to follow up on request for new 90 day supply orders for medications.  Patient reported currently having medications but would like new orders written for 90 day supplies.

## 2014-12-11 ENCOUNTER — Ambulatory Visit: Payer: Medicare Other | Admitting: Rehabilitation

## 2014-12-11 ENCOUNTER — Ambulatory Visit (HOSPITAL_COMMUNITY)
Admission: RE | Admit: 2014-12-11 | Discharge: 2014-12-11 | Disposition: A | Discharge status: Home / Self Care | Payer: Medicare Other | Source: Ambulatory Visit | Attending: Internal Medicine | Admitting: Internal Medicine

## 2014-12-11 DIAGNOSIS — R05 Cough: Secondary | ICD-10-CM | POA: Diagnosis present

## 2014-12-11 DIAGNOSIS — M545 Low back pain, unspecified: Secondary | ICD-10-CM

## 2014-12-11 DIAGNOSIS — M6281 Muscle weakness (generalized): Secondary | ICD-10-CM

## 2014-12-11 DIAGNOSIS — F17210 Nicotine dependence, cigarettes, uncomplicated: Secondary | ICD-10-CM | POA: Insufficient documentation

## 2014-12-11 DIAGNOSIS — R059 Cough, unspecified: Secondary | ICD-10-CM

## 2014-12-11 LAB — PULMONARY FUNCTION TEST
DL/VA % pred: 74 %
DL/VA: 3.49 ml/min/mmHg/L
DLCO COR % PRED: 54 %
DLCO cor: 12.39 ml/min/mmHg
DLCO unc % pred: 54 %
DLCO unc: 12.39 ml/min/mmHg
FEF 25-75 PRE: 2.3 L/s
FEF 25-75 Post: 2.5 L/sec
FEF2575-%Change-Post: 8 %
FEF2575-%Pred-Post: 135 %
FEF2575-%Pred-Pre: 125 %
FEV1-%Change-Post: 2 %
FEV1-%Pred-Post: 109 %
FEV1-%Pred-Pre: 107 %
FEV1-Post: 2.08 L
FEV1-Pre: 2.04 L
FEV1FVC-%CHANGE-POST: 3 %
FEV1FVC-%Pred-Pre: 104 %
FEV6-%CHANGE-POST: 0 %
FEV6-%Pred-Post: 104 %
FEV6-%Pred-Pre: 104 %
FEV6-POST: 2.44 L
FEV6-PRE: 2.44 L
FEV6FVC-%Change-Post: 1 %
FEV6FVC-%Pred-Post: 103 %
FEV6FVC-%Pred-Pre: 102 %
FVC-%CHANGE-POST: -1 %
FVC-%PRED-POST: 100 %
FVC-%PRED-PRE: 102 %
FVC-POST: 2.44 L
FVC-PRE: 2.48 L
POST FEV6/FVC RATIO: 100 %
PRE FEV1/FVC RATIO: 82 %
Post FEV1/FVC ratio: 85 %
Pre FEV6/FVC Ratio: 98 %
RV % pred: 81 %
RV: 1.64 L
TLC % pred: 86 %
TLC: 4.23 L

## 2014-12-11 MED ORDER — ALBUTEROL SULFATE (2.5 MG/3ML) 0.083% IN NEBU
2.5000 mg | INHALATION_SOLUTION | Freq: Once | RESPIRATORY_TRACT | Status: AC
  Administered 2014-12-11: 2.5 mg via RESPIRATORY_TRACT

## 2014-12-11 NOTE — Patient Instructions (Signed)
   Knee to Chest (Flexion)   Pull knee toward chest. Feel stretch in lower back or buttock area. Breathing deeply, Hold __30__ seconds. Repeat with other knee. Repeat ___2_ times. Do _2___ sessions per day.  Isometric Abdominal   Lying on back with knees bent, tighten stomach by pressing elbows down. Hold ___5_ seconds. Repeat _10__ times per set. Do __2__ sets per session. Do __2__ sessions per day.  Lumbar Rotation: Caudal - Bilateral (Supine)   Feet and knees together, arms outstretched, rock knees left and right, staying within shoulder distance ( man in picture is going too far) ,relaxing muscles of low back. Perform for 1 minute. Relax. Repeat _3___ times per set.  Do __3__ sessions per day. Hamstring Step 1   Straighten left knee. Keep knee level with other knee or on bolster. Hold _30__ seconds. Relax knee by returning foot to start. Repeat __3_ times each side  Copyright  VHI. All rights reserved.     FLEXION: Supine (Isometric)   Lie on back, feet flat. Draw left knee toward chest push gently with same side arm. Hold __5_ seconds and feel abdominals tighten Complete __2_ sets of __10_ repetitions each side. Perform __1-2_ sessions per day.  Copyright  VHI. All rights reserved.

## 2014-12-11 NOTE — Therapy (Addendum)
[facility] [place], [place], [address] Phone: [phone]   Fax:  [phone]  Physical Therapy Treatment  Patient Details  Name: [name] MRN: [id number] Date of Birth: [birth year] Referring Provider:  Julious Oka, MD  Encounter Date: 12/11/2014      PT End of Session - 12/11/14 1558    Visit Number 2   Number of Visits 12   Date for PT Re-Evaluation 01/27/15   PT Start Time 0350      Past Medical History  Diagnosis Date  . Hypertension   . Peripheral neuropathy   . Cervicalgia   . Low back pain syndrome   . Hyperlipidemia   . Vitamin D deficiency   . Vitamin B12 deficiency   . Iliotibial band syndrome   . Anemia   . Borderline personality disorder   . Nonorganic psychosis   . GERD (gastroesophageal reflux disease)   . Diabetes mellitus   . Depression   . Diabetic gastroparesis   . CAD (coronary artery disease)     Catherization 11/18/09>nonobstructive CAD , normal LV function, EF 65%  . Health maintenance examination     Colonoscopy 2009> normal; Diabetic eye exam 12/2008. No diabetic retinopathy; Mammogram 11/10> No evidence of malignancy, DXA 03/14/13 : normal  . Headache(784.0)   . Arthritis   . SBO (small bowel obstruction) 01/09/2013  . Diastolic CHF     Grade I, on Echo 06/2013, EF 55-60%  . Sleep apnea     does not wear CPAP   . Diabetes mellitus, type II   . Wears glasses   . Wears dentures     upper  . Gout   . Right carpal tunnel syndrome 09/13/2014    Past Surgical History  Procedure Laterality Date  . Abdominal hysterectomy    . Hand surgery  1992    rt  . Bowel resection N/A 01/10/2013    Procedure: SMALL BOWEL RESECTION;  Surgeon: Madilyn Hook, DO;  Location: [facility];  Service: General;  Laterality: N/A;  . Lysis of adhesion N/A 01/10/2013    Procedure: LYSIS OF ADHESION;  Surgeon: Madilyn Hook, DO;  Location: [facility];  Service: General;  Laterality: N/A;  . Laparotomy N/A  01/10/2013    Procedure: EXPLORATORY LAPAROTOMY;  Surgeon: Madilyn Hook, DO;  Location: [facility];  Service: General;  Laterality: N/A;  . Incisional hernia repair N/A 03/01/2014    Procedure: LAPAROSCOPIC INCISIONAL HERNIA;  Surgeon: Ralene Ok, MD;  Location: [facility];  Service: General;  Laterality: N/A;  . Insertion of mesh N/A 03/01/2014    Procedure: INSERTION OF MESH;  Surgeon: Ralene Ok, MD;  Location: [facility];  Service: General;  Laterality: N/A;  . Hernia repair  03/01/14    Lap incisional hernia repair w/mesh  . Appendectomy    . Carpal tunnel release Right 09/13/2014    Procedure: RIGHT WRIST CARPAL TUNNEL RELEASE;  Surgeon: Johnny Bridge, MD;  Location: [facility];  Service: Orthopedics;  Laterality: Right;  . Steriod injection Left 09/13/2014    Procedure: STEROID INJECTION LEFT HAND;  Surgeon: Johnny Bridge, MD;  Location: [facility];  Service: Orthopedics;  Laterality: Left;    There were no vitals taken for this visit.  Visit Diagnosis:  Midline low back pain without sciatica  Muscle weakness (generalized)      Subjective Assessment - 12/11/14 1555    Currently in Pain? Yes   Pain Score 10-Worst pain ever  Pain Location Back   Pain Orientation Mid;Lower   Pain Descriptors / Indicators Aching   Pain Type Chronic pain   Aggravating Factors  ADLs cooking, cleaning, static positions.    Pain Relieving Factors lying in bed                    [facility] Adult PT Treatment/Exercise - 12/11/14 1606    Lumbar Exercises: Stretches   Active Hamstring Stretch 3 reps;30 seconds   Single Knee to Chest Stretch 3 reps;30 seconds   Lower Trunk Rotation 5 reps;20 seconds   Lumbar Exercises: Aerobic   Stationary Bike Nustel Level3 x 5 min   Lumbar Exercises: Supine   Ab Set 10 reps  max cues for technique   Isometric Hip Flexion 10 reps  to isolate abdominals   Shoulder Exercises: Seated   Retraction Both;10 reps  for postural  training.    Modalities   Modalities Electrical Stimulation;Moist Heat   Moist Heat Therapy   Number Minutes Moist Heat 15 Minutes   Moist Heat Location --  lumbar   Electrical Stimulation   Electrical Stimulation Location lumbar   Electrical Stimulation Action IFC   Electrical Stimulation Parameters to tolerance   Electrical Stimulation Goals Pain                     PT Long Term Goals - 11/28/14 1648    PT LONG TERM GOAL #1   Title independent with HEP (01/09/15)   Time 6   Period Weeks   Status New   PT LONG TERM GOAL #2   Title improve lumbar ROM (flex, ext, and bil sidebending) by at least 5 degrees for improved mobility and without increase in pain (01/09/15)   Time 6   Period Weeks   Status New   PT LONG TERM GOAL #3   Title verbalize understanding of posture/body mechanics to reduce risk of reinjury (01/09/15)   Time 6   Period Weeks   Status New   PT LONG TERM GOAL #4   Title tolerate ability to amb/stand > 45 min without increase in pain (01/09/15)   Time 6   Period Weeks   Status New               Plan - 12/11/14 1558    Clinical Impression Statement Able to establish HEP for lumbar stretches without c/o increased pain despite pt c/o 10/10 pain. Pt reports good pain decrease after Estim and may be interested in home TENS.   PT Next Visit Plan core and hip stability exercises, lumbar and hamstring stretches, cont modalities.         Problem List Patient Active Problem List   Diagnosis Date Noted  . Cough   . MDD (major depressive disorder), recurrent severe, without psychosis 11/11/2014  . Major depressive disorder, recurrent severe without psychotic features   . Left wrist pain 09/13/2014  . Right carpal tunnel syndrome 09/13/2014  . Degenerative joint disease involving multiple joints on both sides of body (right foot pain) 08/14/2014  . Spondylosis of lumbar region without myelopathy or radiculopathy 08/06/2014  . Lumbosacral  spondylosis without myelopathy 07/09/2014  . TMJ syndrome 06/18/2014  . TMJ (dislocation of temporomandibular joint) 06/04/2014  . Chronic low back pain 04/15/2014  . Periumbilical hernia 19/50/9326  . Hot flashes 01/02/2014  . Diastolic CHF 71/24/5809  . Major depressive disorder, recurrent episode, unspecified 06/20/2013  . Fibromyalgia syndrome 06/18/2013  . Abdominal pain, epigastric 03/29/2013  .  Myalgia 12/21/2012  . Chronic chest pain 11/21/2012  . Carpal tunnel syndrome, bilateral 07/25/2012  . Tension headache, chronic 05/24/2012  . Iron deficiency anemia 09/22/2011  . Depression 07/14/2011  . Anxiety 06/02/2011  . CKD (chronic kidney disease) stage 3, GFR 30-59 ml/min 01/22/2011  . Lumbar spondylosis with myelopathy 11/20/2010  . Coronary atherosclerosis 03/10/2010  . Dyslipidemia 12/11/2008  . VITAMIN D DEFICIENCY 11/26/2008  . Uncontrolled type 2 diabetes mellitus with insulin therapy 05/25/2007  . PERIPHERAL NEUROPATHY 05/25/2007  . Essential hypertension 05/25/2007  . GERD 05/25/2007  . SYMPTOM, APNEA, SLEEP NOS 05/25/2007    Dorene Ar, PTA 12/11/2014, 5:17 PM  [facility] [address] [place], [place], [address] Phone: [phone]   Fax:  [phone]      PHYSICAL THERAPY DISCHARGE SUMMARY  Visits from Start of Care: 2  Current functional level related to goals / functional outcomes: See above; pt did not return to PT after 2nd visit   Remaining deficits: unknown   Education / Equipment: HEP  Plan: Patient agrees to discharge.  Patient goals were not met. Patient is being discharged due to not returning since the last visit.  ?????     Laureen Abrahams, PT, DPT 02/20/2015 3:21 PM  [facility] [address] [place] [address]  [phone] (office) [phone] (fax)

## 2014-12-12 ENCOUNTER — Encounter (HOSPITAL_COMMUNITY): Payer: Self-pay | Admitting: Clinical

## 2014-12-12 ENCOUNTER — Ambulatory Visit (INDEPENDENT_AMBULATORY_CARE_PROVIDER_SITE_OTHER): Payer: 59 | Admitting: Clinical

## 2014-12-12 DIAGNOSIS — F331 Major depressive disorder, recurrent, moderate: Secondary | ICD-10-CM | POA: Diagnosis not present

## 2014-12-12 NOTE — Progress Notes (Signed)
   THERAPIST PROGRESS NOTE  Session Time: 2:33 - 3:30  Participation Level: Active  Behavioral Response: Casual and NeatAlertDepressed  Type of Therapy: Individual Therapy  Treatment Goals addressed: psychiatric symptoms, calming skills , emotional regulations skills  Interventions: Motivational Interviewing and Other: Grounding techniques  Summary: [name] is a 64 y.o. female who presents with Major depressive disorder, recurrent episode, moderate.   Suicidal/Homicidal: Nowithout intent/plan  Therapist Response:  Wateen met with clinician for an individual session. She discussed her psychiatric symptoms and her current life events. She shared that she has been feeling low energy since our last session. She shared that she has appointments to see the Dr. about her fatigue and some issues with her stomach. Tangy stated that her husband is not supportive of her going to the Dr., that he believes she should just endure the pain. She stated  That she has learned to tune out some of his comments. Client and clinician discussed her emotions about seeing the Dr.  [name] shared that she was able to make her self speak to one or two church friends but that she is still very reluctant to interact with others. Hailynn and clinician discussed the possibility of trying again this coming week. Karima shared that her Yolanda Bonine has been with her all week. She shared that she believes that having him so long is also tiring her at the same time Quanisha's face lights up when she speaks about him. Client and clinician discussed Alices homework. She reported keeping her gratitude journal. She shared some of her entries and client and clinician discussed how she could deepen her practice. Shardai and clinician also reviewed her grounding techniques. Clinician introduced some addition techniques. Client and clinician practiced them together and then [facility] taught them back to clinician. Tolulope agreed to continue her homework  until next session.     Plan: Return again in 1-2 weeks.  Diagnosis: Axis I: Major depressive disorder, recurrent episode, moderate      Taya Ashbaugh A, LCSW 12/12/2014  .

## 2014-12-15 ENCOUNTER — Other Ambulatory Visit: Payer: Self-pay | Admitting: Internal Medicine

## 2014-12-16 ENCOUNTER — Ambulatory Visit: Payer: Medicare Other | Admitting: Rehabilitation

## 2014-12-18 ENCOUNTER — Ambulatory Visit (HOSPITAL_COMMUNITY): Payer: Self-pay | Admitting: Psychiatry

## 2014-12-19 ENCOUNTER — Ambulatory Visit: Payer: Medicare Other

## 2014-12-19 MED ORDER — CLONAZEPAM 0.5 MG PO TABS: 0.5000 mg | ORAL_TABLET | Freq: Three times a day (TID) | ORAL | Status: DC

## 2014-12-19 MED ORDER — SERTRALINE HCL 50 MG PO TABS: 50.0000 mg | ORAL_TABLET | Freq: Every day | ORAL | Status: DC

## 2014-12-19 MED ORDER — TRAZODONE HCL 50 MG PO TABS: 50.0000 mg | ORAL_TABLET | Freq: Every day | ORAL | Status: DC

## 2014-12-19 NOTE — Addendum Note (Signed)
Addended by: Wilder GladeAYLOR, Keonte Daubenspeck V on: 12/19/2014 05:27 PM   Modules accepted: Orders

## 2014-12-19 NOTE — Addendum Note (Signed)
Addended by: Wilder GladeAYLOR, Laurina Fischl V on: 12/19/2014 05:44 PM   Modules accepted: Orders

## 2014-12-19 NOTE — Telephone Encounter (Signed)
Ok  With me  Can you send this for me

## 2014-12-19 NOTE — Telephone Encounter (Signed)
Called patient to verify orders that need a 90 day supply.  E-scribed in 90 day supply orders to [facility] for patient's Trazadone and Zoloft refills.  Patient would like to know if Dr. Donell BeersPlovsky would refill her Neurontin or should she get this from her provider that treats her neuropathy?  Informed I would question this to Dr. Donell BeersPlovsky but patient should be getting it from her other provider who is treating that condition.

## 2014-12-19 NOTE — Telephone Encounter (Signed)
Patient's authorized change to a 90 day supply of Clonazepam called in to [facility] per instruction of Dr. Donell BeersPlovsky.

## 2014-12-20 ENCOUNTER — Telehealth: Payer: Self-pay | Admitting: *Deleted

## 2014-12-20 NOTE — Telephone Encounter (Signed)
Pt called 11:35AM - having stomach pain since 12/14/14 - just under rib cage area. Has not eaten or taken meds since  12/16/14. Pt denies n/v or diarrhea. Clinic is closed this PM. Instructed pt to go to ER - has someone to drive her. States CBG this AM 199 - has not taken diabetic med since 12/16/14. [name] KidneyDebra Nathanal Hermiz RN 12/20/14 11:39AM

## 2014-12-21 ENCOUNTER — Emergency Department (HOSPITAL_COMMUNITY): Payer: Medicare Other

## 2014-12-21 ENCOUNTER — Encounter (HOSPITAL_COMMUNITY): Payer: Self-pay | Admitting: *Deleted

## 2014-12-21 ENCOUNTER — Emergency Department (HOSPITAL_COMMUNITY)
Admission: EM | Admit: 2014-12-21 | Discharge: 2014-12-21 | Disposition: A | Discharge status: Home / Self Care | Payer: Medicare Other | Attending: Emergency Medicine | Admitting: Emergency Medicine

## 2014-12-21 DIAGNOSIS — R1013 Epigastric pain: Secondary | ICD-10-CM | POA: Diagnosis not present

## 2014-12-21 DIAGNOSIS — M1990 Unspecified osteoarthritis, unspecified site: Secondary | ICD-10-CM | POA: Diagnosis not present

## 2014-12-21 DIAGNOSIS — Z8669 Personal history of other diseases of the nervous system and sense organs: Secondary | ICD-10-CM | POA: Diagnosis not present

## 2014-12-21 DIAGNOSIS — Z880 Allergy status to penicillin: Secondary | ICD-10-CM | POA: Diagnosis not present

## 2014-12-21 DIAGNOSIS — I2510 Atherosclerotic heart disease of native coronary artery without angina pectoris: Secondary | ICD-10-CM | POA: Insufficient documentation

## 2014-12-21 DIAGNOSIS — Z7982 Long term (current) use of aspirin: Secondary | ICD-10-CM | POA: Diagnosis not present

## 2014-12-21 DIAGNOSIS — R109 Unspecified abdominal pain: Secondary | ICD-10-CM | POA: Diagnosis present

## 2014-12-21 DIAGNOSIS — I5030 Unspecified diastolic (congestive) heart failure: Secondary | ICD-10-CM | POA: Diagnosis not present

## 2014-12-21 DIAGNOSIS — F329 Major depressive disorder, single episode, unspecified: Secondary | ICD-10-CM | POA: Insufficient documentation

## 2014-12-21 DIAGNOSIS — R630 Anorexia: Secondary | ICD-10-CM | POA: Diagnosis not present

## 2014-12-21 DIAGNOSIS — Z794 Long term (current) use of insulin: Secondary | ICD-10-CM | POA: Diagnosis not present

## 2014-12-21 DIAGNOSIS — K219 Gastro-esophageal reflux disease without esophagitis: Secondary | ICD-10-CM | POA: Diagnosis not present

## 2014-12-21 DIAGNOSIS — R110 Nausea: Secondary | ICD-10-CM | POA: Diagnosis not present

## 2014-12-21 DIAGNOSIS — I10 Essential (primary) hypertension: Secondary | ICD-10-CM | POA: Insufficient documentation

## 2014-12-21 DIAGNOSIS — Z862 Personal history of diseases of the blood and blood-forming organs and certain disorders involving the immune mechanism: Secondary | ICD-10-CM | POA: Insufficient documentation

## 2014-12-21 DIAGNOSIS — Z87891 Personal history of nicotine dependence: Secondary | ICD-10-CM | POA: Insufficient documentation

## 2014-12-21 DIAGNOSIS — Z79899 Other long term (current) drug therapy: Secondary | ICD-10-CM | POA: Insufficient documentation

## 2014-12-21 DIAGNOSIS — E785 Hyperlipidemia, unspecified: Secondary | ICD-10-CM | POA: Insufficient documentation

## 2014-12-21 DIAGNOSIS — E1165 Type 2 diabetes mellitus with hyperglycemia: Secondary | ICD-10-CM | POA: Diagnosis not present

## 2014-12-21 LAB — CBC WITH DIFFERENTIAL/PLATELET
BASOS PCT: 0 % (ref 0–1)
Basophils Absolute: 0 10*3/uL (ref 0.0–0.1)
EOS ABS: 0.1 10*3/uL (ref 0.0–0.7)
EOS PCT: 2 % (ref 0–5)
HCT: 40 % (ref 36.0–46.0)
HEMOGLOBIN: 12.8 g/dL (ref 12.0–15.0)
LYMPHS PCT: 41 % (ref 12–46)
Lymphs Abs: 2.8 10*3/uL (ref 0.7–4.0)
MCH: 24.1 pg — AB (ref 26.0–34.0)
MCHC: 32 g/dL (ref 30.0–36.0)
MCV: 75.2 fL — AB (ref 78.0–100.0)
MONO ABS: 0.3 10*3/uL (ref 0.1–1.0)
Monocytes Relative: 4 % (ref 3–12)
NEUTROS PCT: 53 % (ref 43–77)
Neutro Abs: 3.7 10*3/uL (ref 1.7–7.7)
Platelets: 236 10*3/uL (ref 150–400)
RBC: 5.32 MIL/uL — ABNORMAL HIGH (ref 3.87–5.11)
RDW: 16 % — AB (ref 11.5–15.5)
WBC: 7 10*3/uL (ref 4.0–10.5)

## 2014-12-21 LAB — URINE MICROSCOPIC-ADD ON

## 2014-12-21 LAB — COMPREHENSIVE METABOLIC PANEL
ALT: 18 U/L (ref 0–35)
AST: 24 U/L (ref 0–37)
Albumin: 4.4 g/dL (ref 3.5–5.2)
Alkaline Phosphatase: 98 U/L (ref 39–117)
Anion gap: 10 (ref 5–15)
BUN: 23 mg/dL (ref 6–23)
CHLORIDE: 102 mmol/L (ref 96–112)
CO2: 24 mmol/L (ref 19–32)
CREATININE: 1.65 mg/dL — AB (ref 0.50–1.10)
Calcium: 10 mg/dL (ref 8.4–10.5)
GFR calc Af Amer: 37 mL/min — ABNORMAL LOW (ref >90)
GFR, EST NON AFRICAN AMERICAN: 32 mL/min — AB (ref >90)
GLUCOSE: 206 mg/dL — AB (ref 70–99)
Potassium: 4.6 mmol/L (ref 3.5–5.1)
SODIUM: 136 mmol/L (ref 135–145)
TOTAL PROTEIN: 8.7 g/dL — AB (ref 6.0–8.3)
Total Bilirubin: 0.8 mg/dL (ref 0.3–1.2)

## 2014-12-21 LAB — URINALYSIS, ROUTINE W REFLEX MICROSCOPIC
GLUCOSE, UA: NEGATIVE mg/dL
HGB URINE DIPSTICK: NEGATIVE
Ketones, ur: 15 mg/dL — AB
Leukocytes, UA: NEGATIVE
NITRITE: NEGATIVE
Protein, ur: 30 mg/dL — AB
SPECIFIC GRAVITY, URINE: 1.025 (ref 1.005–1.030)
UROBILINOGEN UA: 1 mg/dL (ref 0.0–1.0)
pH: 5.5 (ref 5.0–8.0)

## 2014-12-21 LAB — LIPASE, BLOOD: Lipase: 46 U/L (ref 11–59)

## 2014-12-21 MED ORDER — ONDANSETRON HCL 4 MG/2ML IJ SOLN
4.0000 mg | Freq: Once | INTRAMUSCULAR | Status: AC
  Administered 2014-12-21: 4 mg via INTRAVENOUS
  Filled 2014-12-21: qty 2

## 2014-12-21 MED ORDER — SUCRALFATE 1 G PO TABS: 1.0000 g | ORAL_TABLET | Freq: Three times a day (TID) | ORAL | Status: DC

## 2014-12-21 MED ORDER — IOHEXOL 300 MG/ML  SOLN
80.0000 mL | Freq: Once | INTRAMUSCULAR | Status: AC | PRN
  Administered 2014-12-21: 80 mL via INTRAVENOUS

## 2014-12-21 MED ORDER — DICYCLOMINE HCL 20 MG PO TABS
20.0000 mg | ORAL_TABLET | Freq: Once | ORAL | Status: AC
  Administered 2014-12-21: 20 mg via ORAL
  Filled 2014-12-21: qty 1

## 2014-12-21 MED ORDER — GI COCKTAIL ~~LOC~~
30.0000 mL | Freq: Once | ORAL | Status: AC
  Administered 2014-12-21: 30 mL via ORAL
  Filled 2014-12-21: qty 30

## 2014-12-21 MED ORDER — IOHEXOL 300 MG/ML  SOLN
25.0000 mL | Freq: Once | INTRAMUSCULAR | Status: AC | PRN
  Administered 2014-12-21: 25 mL via INTRAVENOUS

## 2014-12-21 MED ORDER — MORPHINE SULFATE 4 MG/ML IJ SOLN
4.0000 mg | Freq: Once | INTRAMUSCULAR | Status: AC
  Administered 2014-12-21: 4 mg via INTRAVENOUS
  Filled 2014-12-21: qty 1

## 2014-12-21 NOTE — ED Notes (Signed)
CT notified patient finished with contrast 

## 2014-12-21 NOTE — ED Notes (Signed)
Pt reports mid abd pain since Monday. Denies n/v/d but pain increases after eating.

## 2014-12-21 NOTE — ED Provider Notes (Signed)
CSN: [id number]     Arrival date & time 12/21/14  1045 History   First MD Initiated Contact with Patient 12/21/14 1102     Chief Complaint  Patient presents with  . Abdominal Pain     (Consider location/radiation/quality/duration/timing/severity/associated sxs/prior Treatment) HPI   This is a 64 year old female with past medical history of hypertension, history of mental illness, diabetes, gastroparesis, previous small bowel obstruction and hernia repair, and borderline personality disorder. The patient complains of epigastric abdominal pain which has been ongoing for the past week. She has had very little appetite. Patient reports bowel movements every other day and states that they're small. She denies diarrhea, melena or hematochezia, she denies vomiting. She has had some intermittent nausea. Patient denies fevers, chills, chest pain, shortness of breath. She states that her pain is worse with eating. She denies any history of peptic ulcer disease, heavy NSAID use, alcohol abuse or smoking. She has not tried anything to relieve the pain.  Past Medical History  Diagnosis Date  . Hypertension   . Peripheral neuropathy   . Cervicalgia   . Low back pain syndrome   . Hyperlipidemia   . Vitamin D deficiency   . Vitamin B12 deficiency   . Iliotibial band syndrome   . Anemia   . Borderline personality disorder   . Nonorganic psychosis   . GERD (gastroesophageal reflux disease)   . Diabetes mellitus   . Depression   . Diabetic gastroparesis   . CAD (coronary artery disease)     Catherization 11/18/09>nonobstructive CAD , normal LV function, EF 65%  . Health maintenance examination     Colonoscopy 2009> normal; Diabetic eye exam 12/2008. No diabetic retinopathy; Mammogram 11/10> No evidence of malignancy, DXA 03/14/13 : normal  . Headache(784.0)   . Arthritis   . SBO (small bowel obstruction) 01/09/2013  . Diastolic CHF     Grade I, on Echo 06/2013, EF 55-60%  . Sleep apnea     does not  wear CPAP   . Diabetes mellitus, type II   . Wears glasses   . Wears dentures     upper  . Gout   . Right carpal tunnel syndrome 09/13/2014   Past Surgical History  Procedure Laterality Date  . Abdominal hysterectomy    . Hand surgery  1992    rt  . Bowel resection N/A 01/10/2013    Procedure: SMALL BOWEL RESECTION;  Surgeon: Lodema Pilot, DO;  Location: [facility];  Service: General;  Laterality: N/A;  . Lysis of adhesion N/A 01/10/2013    Procedure: LYSIS OF ADHESION;  Surgeon: Lodema Pilot, DO;  Location: [facility] OR;  Service: General;  Laterality: N/A;  . Laparotomy N/A 01/10/2013    Procedure: EXPLORATORY LAPAROTOMY;  Surgeon: Lodema Pilot, DO;  Location: [facility] OR;  Service: General;  Laterality: N/A;  . Incisional hernia repair N/A 03/01/2014    Procedure: LAPAROSCOPIC INCISIONAL HERNIA;  Surgeon: Axel Filler, MD;  Location: [facility];  Service: General;  Laterality: N/A;  . Insertion of mesh N/A 03/01/2014    Procedure: INSERTION OF MESH;  Surgeon: Axel Filler, MD;  Location: [facility];  Service: General;  Laterality: N/A;  . Hernia repair  03/01/14    Lap incisional hernia repair w/mesh  . Appendectomy    . Carpal tunnel release Right 09/13/2014    Procedure: RIGHT WRIST CARPAL TUNNEL RELEASE;  Surgeon: Eulas Post, MD;  Location: [facility];  Service: Orthopedics;  Laterality: Right;  . Steriod injection Left  09/13/2014    Procedure: STEROID INJECTION LEFT HAND;  Surgeon: Eulas Post, MD;  Location: [facility];  Service: Orthopedics;  Laterality: Left;   Family History  Problem Relation Age of Onset  . Diabetes Mother   . Hypertension Mother   . Hyperlipidemia Mother   . Arthritis Mother   . Depression Mother   . Heart disease Father   . Diabetes Brother   . Heart disease Brother   . Heart disease Paternal Grandmother   . Diabetes Brother    History  Substance Use Topics  . Smoking status: Former Smoker    Types: Cigarettes    Quit date:  10/25/2002  . Smokeless tobacco: Never Used  . Alcohol Use: No   OB History    No data available     Review of Systems  Ten systems reviewed and are negative for acute change, except as noted in the HPI.    Allergies  Cimetidine; Indomethacin; Penicillins; Indomethacin; and Sulfonamide derivatives  Home Medications   Prior to Admission medications   Medication Sig Start Date End Date Taking? Authorizing Provider  acetaminophen-codeine (TYLENOL #4) 300-60 MG per tablet take 1 tablet by mouth three times a day 09/16/14  Yes Erick Colace, MD  albuterol (PROVENTIL HFA;VENTOLIN HFA) 108 (90 BASE) MCG/ACT inhaler Inhale 2 puffs into the lungs every 6 (six) hours as needed for wheezing or shortness of breath. 11/19/14  Yes Otis Brace, MD  aspirin 81 MG chewable tablet Chew 81 mg by mouth daily.   Yes Historical Provider, MD  benzonatate (TESSALON) 200 MG capsule Take 1 capsule (200 mg total) by mouth 3 (three) times daily as needed for cough. 11/19/14  Yes Marjan Rabbani, MD  buPROPion (WELLBUTRIN XL) 150 MG 24 hr tablet Take 450 mg by mouth daily.   Yes Historical Provider, MD  busPIRone (BUSPAR) 10 MG tablet Take 2 tablets (20 mg total) by mouth 3 (three) times daily at 8am, 2pm and bedtime. For anxiety 11/13/14  Yes Shuvon Rankin, NP  clonazePAM (KLONOPIN) 0.5 MG tablet Take 1 tablet (0.5 mg total) by mouth 3 (three) times daily. For anxiety 12/19/14  Yes Archer Asa, MD  cloNIDine (CATAPRES - DOSED IN MG/24 HR) 0.3 mg/24hr patch Place 1 patch (0.3 mg total) onto the skin every 7 (seven) days. 09/25/14  Yes Gara Kroner, MD  dexlansoprazole (DEXILANT) 60 MG capsule Take 1 capsule (60 mg total) by mouth daily. For reflux 11/13/14  Yes Shuvon Rankin, NP  Docusate Sodium (COLACE PO) Take 2 tablets by mouth daily as needed (for constipation).    Yes Historical Provider, MD  furosemide (LASIX) 40 MG tablet Take 1 tablet (40 mg total) by mouth every morning. 09/25/14  Yes Gara Kroner, MD   gabapentin (NEURONTIN) 400 MG capsule Take 2 tablets (800 mg) every morning and 4 tablets (1,600 mg) every night 11/13/14  Yes Shuvon Rankin, NP  insulin aspart (NOVOLOG FLEXPEN) 100 UNIT/ML FlexPen inject with meals and snacks up to 8 times a day as directed by your aviva glucometer. Patient taking differently: Inject 6-21 Units into the skin. inject with meals and snacks up to 8 times a day as directed by your aviva glucometer. Sliding scale 09/03/14  Yes Gara Kroner, MD  Insulin Glargine (LANTUS SOLOSTAR) 100 UNIT/ML Solostar Pen Inject 51 Units into the skin at bedtime. 10/23/14  Yes Ky Barban, MD  isosorbide mononitrate (IMDUR) 60 MG 24 hr tablet Take 1 tablet (60 mg total) by mouth every morning.  06/18/14  Yes Gara Kroner, MD  losartan (COZAAR) 100 MG tablet Take 50 mg by mouth every morning.   Yes Historical Provider, MD  metoprolol succinate (TOPROL-XL) 25 MG 24 hr tablet Take 1 tablet (25 mg total) by mouth every morning. 06/18/14  Yes Gara Kroner, MD  ondansetron (ZOFRAN) 4 MG tablet Take 4 mg by mouth every 8 (eight) hours as needed for nausea.  04/11/14  Yes Historical Provider, MD  rosuvastatin (CRESTOR) 10 MG tablet Take 1 tablet (10 mg total) by mouth at bedtime. 03/08/14 03/08/15 Yes Neema Davina Poke, MD  senna (SENOKOT) 8.6 MG TABS Take 4 tablets by mouth daily as needed (for constipation).    Yes Historical Provider, MD  sertraline (ZOLOFT) 50 MG tablet Take 1 tablet (50 mg total) by mouth daily. 12/19/14  Yes Archer Asa, MD  Simethicone (GAS-X PO) Take 2 tablets by mouth as needed (gas).   Yes Historical Provider, MD  traZODone (DESYREL) 50 MG tablet Take 1 tablet (50 mg total) by mouth at bedtime. 12/19/14  Yes Archer Asa, MD  ACCU-CHEK FASTCLIX LANCETS MISC Use to Check Blood Sugars up to 4 Times a Day. ICD-10 Code: E11.65 08/07/14   Gara Kroner, MD  ACCU-CHEK FASTCLIX LANCETS MISC TEST five times a day 12/16/14   Gara Kroner, MD  albuterol (PROVENTIL) (2.5 MG/3ML)  0.083% nebulizer solution Take 3 mLs (2.5 mg total) by nebulization every 4 (four) hours as needed for wheezing or shortness of breath (cough). Patient not taking: Reported on 11/28/2014 11/13/14   Shuvon Rankin, NP  BuPROPion HCl ER, XL, 450 MG TB24 Take 450 mg by mouth every morning. 11/13/14   Archer Asa, MD  glucose blood (ACCU-CHEK AVIVA PLUS) test strip Check blood sugar up to 4 times a day as instructed 09/03/14   Earl Lagos, MD  guaiFENesin (MUCINEX) 600 MG 12 hr tablet Take 1 tablet (600 mg total) by mouth 2 (two) times daily. Patient not taking: Reported on 11/28/2014 11/13/14   Shuvon Rankin, NP  Insulin Pen Needle (BD PEN NEEDLE NANO U/F) 32G X 4 MM MISC USE TO TEST UP TO 5 TIMES A DAY ACCORDING TO CBG 09/03/14   Nischal Narendra, MD  loratadine (CLARITIN) 10 MG tablet Take 1 tablet (10 mg total) by mouth daily. For seasonal allergic rhinitis Patient not taking: Reported on 11/28/2014 11/13/14   Shuvon Rankin, NP  sucralfate (CARAFATE) 1 G tablet Take 1 tablet (1 g total) by mouth 4 (four) times daily -  with meals and at bedtime. 12/21/14   Arthor Captain, PA-C  tiZANidine (ZANAFLEX) 2 MG tablet Take 1 tablet (2 mg total) by mouth every 8 (eight) hours as needed for muscle spasms. Patient not taking: Reported on 11/28/2014 09/10/14   Erick Colace, MD   BP 161/70 mmHg  Pulse 68  Temp(Src) 98.6 F (37 C) (Oral)  Resp 14  SpO2 100% Physical Exam  Constitutional: She is oriented to person, place, and time. She appears well-developed and well-nourished. No distress.  HENT:  Head: Normocephalic and atraumatic.  Eyes: Conjunctivae are normal. No scleral icterus.  Neck: Normal range of motion.  Cardiovascular: Normal rate, regular rhythm and normal heart sounds.  Exam reveals no gallop and no friction rub.   No murmur heard. Pulmonary/Chest: Effort normal and breath sounds normal. No respiratory distress.  Abdominal: Soft. Bowel sounds are normal. She exhibits no distension and no  mass. There is tenderness ( epigastric). There is no guarding.  No CVA tenderness  Neurological: She  is alert and oriented to person, place, and time.  Skin: Skin is warm and dry. She is not diaphoretic.    ED Course  Procedures (including critical care time) Labs Review Labs Reviewed  CBC WITH DIFFERENTIAL/PLATELET - Abnormal; Notable for the following:    RBC 5.32 (*)    MCV 75.2 (*)    MCH 24.1 (*)    RDW 16.0 (*)    All other components within normal limits  COMPREHENSIVE METABOLIC PANEL - Abnormal; Notable for the following:    Glucose, Bld 206 (*)    Creatinine, Ser 1.65 (*)    Total Protein 8.7 (*)    GFR calc non Af Amer 32 (*)    GFR calc Af Amer 37 (*)    All other components within normal limits  URINALYSIS, ROUTINE W REFLEX MICROSCOPIC - Abnormal; Notable for the following:    Bilirubin Urine SMALL (*)    Ketones, ur 15 (*)    Protein, ur 30 (*)    All other components within normal limits  URINE MICROSCOPIC-ADD ON - Abnormal; Notable for the following:    Bacteria, UA FEW (*)    All other components within normal limits  LIPASE, BLOOD    Imaging Review No results found.   EKG Interpretation None      MDM   Final diagnoses:  Abdominal pain  Epigastric pain    Patient with hyperglycemia, labs are otherwise unremarkable. CT and acute abdominal films without acute abnormality. Patient is nontoxic, nonseptic appearing, in no apparent distress.  Patient's pain and other symptoms adequately managed in emergency department.  Fluid bolus given.  Labs, imaging and vitals reviewed.  Patient does not meet the SIRS or Sepsis criteria.  On repeat exam patient does not have a surgical abdomin and there are no peritoneal signs.  No indication of appendicitis, bowel obstruction, bowel perforation, cholecystitis, diverticulitis, PID or ectopic pregnancy.  Patient discharged home with symptomatic treatment and given strict instructions for follow-up with their primary  care physician.  I have also discussed reasons to return immediately to the ER.  Patient expresses understanding and agrees with plan.     ']  Arthor CaptainAbigail Alizah Sills, PA-C 12/26/14 1036  Arby BarretteMarcy Pfeiffer, MD 12/26/14 [phone]

## 2014-12-21 NOTE — Discharge Instructions (Signed)
Gastritis, Adult Gastritis is soreness and swelling (inflammation) of the lining of the stomach. Gastritis can develop as a sudden onset (acute) or long-term (chronic) condition. If gastritis is not treated, it can lead to stomach bleeding and ulcers. CAUSES  Gastritis occurs when the stomach lining is weak or damaged. Digestive juices from the stomach then inflame the weakened stomach lining. The stomach lining may be weak or damaged due to viral or bacterial infections. One common bacterial infection is the Helicobacter pylori infection. Gastritis can also result from excessive alcohol consumption, taking certain medicines, or having too much acid in the stomach.  SYMPTOMS  In some cases, there are no symptoms. When symptoms are present, they may include:  Pain or a burning sensation in the upper abdomen.  Nausea.  Vomiting.  An uncomfortable feeling of fullness after eating. DIAGNOSIS  Your caregiver may suspect you have gastritis based on your symptoms and a physical exam. To determine the cause of your gastritis, your caregiver may perform the following:  Blood or stool tests to check for the H pylori bacterium.  Gastroscopy. A thin, flexible tube (endoscope) is passed down the esophagus and into the stomach. The endoscope has a light and camera on the end. Your caregiver uses the endoscope to view the inside of the stomach.  Taking a tissue sample (biopsy) from the stomach to examine under a microscope. TREATMENT  Depending on the cause of your gastritis, medicines may be prescribed. If you have a bacterial infection, such as an H pylori infection, antibiotics may be given. If your gastritis is caused by too much acid in the stomach, H2 blockers or antacids may be given. Your caregiver may recommend that you stop taking aspirin, ibuprofen, or other nonsteroidal anti-inflammatory drugs (NSAIDs). HOME CARE INSTRUCTIONS  Only take over-the-counter or prescription medicines as directed by  your caregiver.  If you were given antibiotic medicines, take them as directed. Finish them even if you start to feel better.  Drink enough fluids to keep your urine clear or pale yellow.  Avoid foods and drinks that make your symptoms worse, such as:  Caffeine or alcoholic drinks.  Chocolate.  Peppermint or mint flavorings.  Garlic and onions.  Spicy foods.  Citrus fruits, such as oranges, lemons, or limes.  Tomato-based foods such as sauce, chili, salsa, and pizza.  Fried and fatty foods.  Eat small, frequent meals instead of large meals. SEEK IMMEDIATE MEDICAL CARE IF:   You have black or dark red stools.  You vomit blood or material that looks like coffee grounds.  You are unable to keep fluids down.  Your abdominal pain gets worse.  You have a fever.  You do not feel better after 1 week.  You have any other questions or concerns. MAKE SURE YOU:  Understand these instructions.  Will watch your condition.  Will get help right away if you are not doing well or get worse. Document Released: 10/05/2001 Document Revised: 04/11/2012 Document Reviewed: 11/24/2011 ExitCare Patient Information 2015 [employer]. This information is not intended to replace advice given to you by your health care provider. Make sure you discuss any questions you have with your health care provider.  Food Choices for Gastroesophageal Reflux Disease When you have gastroesophageal reflux disease (GERD), the foods you eat and your eating habits are very important. Choosing the right foods can help ease the discomfort of GERD. WHAT GENERAL GUIDELINES DO I NEED TO FOLLOW?  Choose fruits, vegetables, whole grains, low-fat dairy products, and low-fat   meat, fish, and poultry.  Limit fats such as oils, salad dressings, butter, nuts, and avocado.  Keep a food diary to identify foods that cause symptoms.  Avoid foods that cause reflux. These may be different for different people.  Eat  frequent small meals instead of three large meals each day.  Eat your meals slowly, in a relaxed setting.  Limit fried foods.  Cook foods using methods other than frying.  Avoid drinking alcohol.  Avoid drinking large amounts of liquids with your meals.  Avoid bending over or lying down until 2-3 hours after eating. WHAT FOODS ARE NOT RECOMMENDED? The following are some foods and drinks that may worsen your symptoms: Vegetables Tomatoes. Tomato juice. Tomato and spaghetti sauce. Chili peppers. Onion and garlic. Horseradish. Fruits Oranges, grapefruit, and lemon (fruit and juice). Meats High-fat meats, fish, and poultry. This includes hot dogs, ribs, ham, sausage, salami, and bacon. Dairy Whole milk and chocolate milk. Sour cream. Cream. Butter. Ice cream. Cream cheese.  Beverages Coffee and tea, with or without caffeine. Carbonated beverages or energy drinks. Condiments Hot sauce. Barbecue sauce.  Sweets/Desserts Chocolate and cocoa. Donuts. Peppermint and spearmint. Fats and Oils High-fat foods, including French fries and potato chips. Other Vinegar. Strong spices, such as black pepper, white pepper, red pepper, cayenne, curry powder, cloves, ginger, and chili powder. The items listed above may not be a complete list of foods and beverages to avoid. Contact your dietitian for more information. Document Released: 10/11/2005 Document Revised: 10/16/2013 Document Reviewed: 08/15/2013 ExitCare Patient Information 2015 [employer]. This information is not intended to replace advice given to you by your health care provider. Make sure you discuss any questions you have with your health care provider.   

## 2014-12-23 ENCOUNTER — Telehealth: Payer: Self-pay | Admitting: *Deleted

## 2014-12-23 ENCOUNTER — Encounter (HOSPITAL_COMMUNITY): Payer: Self-pay | Admitting: Clinical

## 2014-12-23 ENCOUNTER — Ambulatory Visit (INDEPENDENT_AMBULATORY_CARE_PROVIDER_SITE_OTHER): Payer: 59 | Admitting: Clinical

## 2014-12-23 DIAGNOSIS — F331 Major depressive disorder, recurrent, moderate: Secondary | ICD-10-CM

## 2014-12-23 NOTE — Telephone Encounter (Signed)
Pt called - went to ER 12/20/14 and ER suggest to stop Aspirin 81mg  till she see Dr Matthias HughsBuccini. Pt has a call  In to Dr Matthias HughsBuccini for an appt. Pt is not sure if she needs a referral - [facility] after office calls pt. [name] KidneyDebra Treyton Slimp RN 12/23/14 9:40AM

## 2014-12-23 NOTE — Progress Notes (Signed)
   THERAPIST PROGRESS NOTE  Session Time: 2:30-3:30  Participation Level: Active  Behavioral Response: Casual and NeatAlertDepressed  Type of Therapy: Individual Therapy  Treatment Goals addressed: improve psychiatric symptoms, emotional regulation skills, healthy coping skills (self care)  Interventions: CBT and Motivational Interviewing  Summary: [name] is a 64 y.o. female who presents with Major depressive disorder, recurrent episode, moderate.   Suicidal/Homicidal: Nowithout intent/plan  Therapist Response: Albirda met with clinician for an individual session. Keiondra discussed her psychiatric symptoms, her current life events and her homework. Zuleima shared that she had been very depressed and physically unwell  this past week. She shared that she had been finding it difficult to do her daily tasks.  She shared that she had taken her daughter out but her daughter had spent more time on the phone with others than interacting with her which made her feel worse than not going out at all. She stated  that though she made herself talk to one member of her church family, she did not want to visit with anyone. She stated that she did not even want to have her grandbaby visit, but her husband had brought him home towards the end of the week. She also shared that her and her husband should be moving this week but she is having a difficult time motivating herself to pack. She shared that she felt physically fatigued and was concerned about her health. She discussed some of her health issues and her plans to share the information with her doctor. Maury and clinician discussed the coping skills she had been using. She shared that she had used her grounding skills occasionally but not as much as "could" have. She reported that she had a difficult time motivating herself to do "anything" including her homework. Clinician asked what Jamilya could do to improve her mood. Nakayla shared that if she did some self  care she would likely feel better. Tarae and clinician discussed self care. Kloe identified some self care that she would commit to trying. Nyjah stated that she would be likely to try her homework again this week. Anasophia stated that she felt a little better after the session.      Plan: Return again in 1-2 weeks.  Diagnosis: Axis I:Major depressive disorder, recurrent episode, moderate      Legend Pecore A, LCSW 12/23/2014

## 2014-12-25 ENCOUNTER — Telehealth (HOSPITAL_COMMUNITY): Payer: Self-pay

## 2014-12-25 NOTE — Telephone Encounter (Signed)
Patient called stating she had been out of BuSpar for the past 2 weeks and was requesting a refill.  Patient stated "no change in my anxiety or depression since being out" and reported she was "doing about the same".  Patient stated taking klonopin daily and doing fine with anxiety just with klonopin.  Agreed to send a message to Dr. Donell BeersPlovsky to question need for new Buspar order or if he would like to patient to continue without it since states still "doing okay" without the Buspar medication.  Patient to return for next evaluation on 01/01/15.

## 2014-12-26 NOTE — Telephone Encounter (Signed)
First attempted to reach patient at her home phone number and requested her husband have her call this nurse back.  Next attempted to reach patient on her cell phone and left a message of Dr. Caprice RenshawPlovsky's agreement not to return to taking Buspar at this time since patient reporting doing fine without it and with no increase in anxiety.  Informed patient to call back if any problems and to keep appointment on 01/01/15 to assess her status with continued anxiety level as patient did state she was doing fine with current Klonopin only.

## 2014-12-30 ENCOUNTER — Encounter: Payer: Self-pay | Admitting: Physical Medicine & Rehabilitation

## 2014-12-30 ENCOUNTER — Other Ambulatory Visit: Payer: Self-pay | Admitting: Physical Medicine & Rehabilitation

## 2014-12-30 ENCOUNTER — Encounter: Payer: Medicare Other | Attending: Physical Medicine & Rehabilitation

## 2014-12-30 ENCOUNTER — Ambulatory Visit (HOSPITAL_BASED_OUTPATIENT_CLINIC_OR_DEPARTMENT_OTHER): Payer: Medicare Other | Admitting: Physical Medicine & Rehabilitation

## 2014-12-30 VITALS — BP 119/63 | HR 74 | Resp 14

## 2014-12-30 DIAGNOSIS — Z5181 Encounter for therapeutic drug level monitoring: Secondary | ICD-10-CM | POA: Diagnosis not present

## 2014-12-30 DIAGNOSIS — M47817 Spondylosis without myelopathy or radiculopathy, lumbosacral region: Secondary | ICD-10-CM | POA: Diagnosis not present

## 2014-12-30 DIAGNOSIS — Z79899 Other long term (current) drug therapy: Secondary | ICD-10-CM

## 2014-12-30 DIAGNOSIS — G894 Chronic pain syndrome: Secondary | ICD-10-CM | POA: Diagnosis present

## 2014-12-30 DIAGNOSIS — M51369 Other intervertebral disc degeneration, lumbar region without mention of lumbar back pain or lower extremity pain: Secondary | ICD-10-CM | POA: Insufficient documentation

## 2014-12-30 DIAGNOSIS — M47816 Spondylosis without myelopathy or radiculopathy, lumbar region: Secondary | ICD-10-CM

## 2014-12-30 DIAGNOSIS — M5136 Other intervertebral disc degeneration, lumbar region: Secondary | ICD-10-CM

## 2014-12-30 DIAGNOSIS — M4716 Other spondylosis with myelopathy, lumbar region: Secondary | ICD-10-CM

## 2014-12-30 DIAGNOSIS — M533 Sacrococcygeal disorders, not elsewhere classified: Secondary | ICD-10-CM

## 2014-12-30 MED ORDER — ACETAMINOPHEN-CODEINE #4 300-60 MG PO TABS: 2.0000 | ORAL_TABLET | Freq: Two times a day (BID) | ORAL | Status: DC | PRN

## 2014-12-30 NOTE — Progress Notes (Signed)
Subjective:    Patient ID: [name], female    DOB: 1950/11/10, 64 y.o.   MRN: [id number]  HPI Stomach problems over the last week Referred to PT by PCP but has only had 2 sessions "working on" depression with behavioral health Doesn't feel like radiofrequency was very helpful for left side. She did have good response to the left sided medial branch blocks to the same nerves. Also did not get much relief with the left sacroiliac injection. Pain Inventory Average Pain 7 Pain Right Now 7 My pain is burning  In the last 24 hours, has pain interfered with the following? General activity 6 Relation with others 7 Enjoyment of life 7 What TIME of day is your pain at its worst? varies Sleep (in general) Good  Pain is worse with: walking, standing and some activites Pain improves with: rest and medication Relief from Meds: 2  Mobility walk without assistance  Function Do you have any goals in this area?  no  Neuro/Psych numbness depression anxiety  Prior Studies Any changes since last visit?  no  Physicians involved in your care Any changes since last visit?  no   Family History  Problem Relation Age of Onset  . Diabetes Mother   . Hypertension Mother   . Hyperlipidemia Mother   . Arthritis Mother   . Depression Mother   . Heart disease Father   . Diabetes Brother   . Heart disease Brother   . Heart disease Paternal Grandmother   . Diabetes Brother    History   Social History  . Marital Status: Married    Spouse Name: N/A  . Number of Children: N/A  . Years of Education: N/A   Social History Main Topics  . Smoking status: Former Smoker    Types: Cigarettes    Quit date: 10/25/2002  . Smokeless tobacco: Never Used  . Alcohol Use: No  . Drug Use: No  . Sexual Activity: Not Currently   Other Topics Concern  . None   Social History Narrative   Past Surgical History  Procedure Laterality Date  . Abdominal hysterectomy    . Hand surgery  1992      rt  . Bowel resection N/A 01/10/2013    Procedure: SMALL BOWEL RESECTION;  Surgeon: Lodema PilotBrian Layton, DO;  Location: [facility];  Service: General;  Laterality: N/A;  . Lysis of adhesion N/A 01/10/2013    Procedure: LYSIS OF ADHESION;  Surgeon: Lodema PilotBrian Layton, DO;  Location: [facility];  Service: General;  Laterality: N/A;  . Laparotomy N/A 01/10/2013    Procedure: EXPLORATORY LAPAROTOMY;  Surgeon: Lodema PilotBrian Layton, DO;  Location: [facility];  Service: General;  Laterality: N/A;  . Incisional hernia repair N/A 03/01/2014    Procedure: LAPAROSCOPIC INCISIONAL HERNIA;  Surgeon: Axel FillerArmando Ramirez, MD;  Location: [facility];  Service: General;  Laterality: N/A;  . Insertion of mesh N/A 03/01/2014    Procedure: INSERTION OF MESH;  Surgeon: Axel FillerArmando Ramirez, MD;  Location: [facility];  Service: General;  Laterality: N/A;  . Hernia repair  03/01/14    Lap incisional hernia repair w/mesh  . Appendectomy    . Carpal tunnel release Right 09/13/2014    Procedure: RIGHT WRIST CARPAL TUNNEL RELEASE;  Surgeon: Eulas PostJoshua P Landau, MD;  Location: [facility];  Service: Orthopedics;  Laterality: Right;  . Steriod injection Left 09/13/2014    Procedure: STEROID INJECTION LEFT HAND;  Surgeon: Eulas PostJoshua P Landau, MD;  Location: [facility];  Service:  Orthopedics;  Laterality: Left;   Past Medical History  Diagnosis Date  . Hypertension   . Peripheral neuropathy   . Cervicalgia   . Low back pain syndrome   . Hyperlipidemia   . Vitamin D deficiency   . Vitamin B12 deficiency   . Iliotibial band syndrome   . Anemia   . Borderline personality disorder   . Nonorganic psychosis   . GERD (gastroesophageal reflux disease)   . Diabetes mellitus   . Depression   . Diabetic gastroparesis   . CAD (coronary artery disease)     Catherization 11/18/09>nonobstructive CAD , normal LV function, EF 65%  . Health maintenance examination     Colonoscopy 2009> normal; Diabetic eye exam 12/2008. No diabetic retinopathy; Mammogram 11/10> No  evidence of malignancy, DXA 03/14/13 : normal  . Headache(784.0)   . Arthritis   . SBO (small bowel obstruction) 01/09/2013  . Diastolic CHF     Grade I, on Echo 06/2013, EF 55-60%  . Sleep apnea     does not wear CPAP   . Diabetes mellitus, type II   . Wears glasses   . Wears dentures     upper  . Gout   . Right carpal tunnel syndrome 09/13/2014   BP 119/63 mmHg  Pulse 74  Resp 14  SpO2 99%  Opioid Risk Score: 5 Fall Risk Score: Moderate Fall Risk (6-13 points) (previously educated and given handout)  Review of Systems  Neurological: Positive for numbness.  Psychiatric/Behavioral: Positive for dysphoric mood. The patient is nervous/anxious.   All other systems reviewed and are negative.      Objective:   Physical Exam  Constitutional: She is oriented to person, place, and time. She appears well-developed and well-nourished.  HENT:  Head: Normocephalic and atraumatic.  Eyes: Conjunctivae and EOM are normal. Pupils are equal, round, and reactive to light.  Neurological: She is alert and oriented to person, place, and time. She has normal reflexes.  Psychiatric: She has a normal mood and affect.  Nursing note and vitals reviewed.  Giveaway weakness bilateral lower extremities 4/5 in hip flexion and extensor ankle dorsal flexor Hip range of motion reduce both internal and external rotation.       Assessment & Plan:  1.  Lumbar spondylosis and degenerative disc I agree with physical therapy. No significant improvements with radiofrequency procedure even though that the medial branch blocks were at least temporarily helpful. Patient has tried oxycodone and hydrocodone in the past without significant benefit She gets some partial benefit with codeine, Tylenol 4, 2 tablets at a time We discussed timing of medications to coincide with activity. She should take her Tylenol No. 4 about 45 minutes prior to exercise or housework activity.  Follow-up 3 months with nurse  practitioner If patient develops any type of radicular symptoms would recommend repeat imaging 2. Hip contracture We discussed hip stretching, Cross leg stretching for external rotation, physical therapy can address this as well

## 2014-12-30 NOTE — Patient Instructions (Addendum)
Please do hip stretching exercises crossing one leg over the other and holding it for about 1 minute twice a day  Take 2 Tylenol fours twice a day about 45 minutes prior to activity

## 2014-12-31 ENCOUNTER — Ambulatory Visit (INDEPENDENT_AMBULATORY_CARE_PROVIDER_SITE_OTHER): Payer: 59 | Admitting: Clinical

## 2014-12-31 DIAGNOSIS — F331 Major depressive disorder, recurrent, moderate: Secondary | ICD-10-CM

## 2015-01-01 ENCOUNTER — Encounter (HOSPITAL_COMMUNITY): Payer: Self-pay | Admitting: Clinical

## 2015-01-01 ENCOUNTER — Ambulatory Visit (INDEPENDENT_AMBULATORY_CARE_PROVIDER_SITE_OTHER): Payer: 59 | Admitting: Psychiatry

## 2015-01-01 ENCOUNTER — Encounter (HOSPITAL_COMMUNITY): Payer: Self-pay | Admitting: Psychiatry

## 2015-01-01 VITALS — BP 111/70 | HR 74 | Ht 65.0 in | Wt 199.0 lb

## 2015-01-01 DIAGNOSIS — F330 Major depressive disorder, recurrent, mild: Secondary | ICD-10-CM

## 2015-01-01 DIAGNOSIS — F332 Major depressive disorder, recurrent severe without psychotic features: Secondary | ICD-10-CM

## 2015-01-01 DIAGNOSIS — F331 Major depressive disorder, recurrent, moderate: Secondary | ICD-10-CM

## 2015-01-01 LAB — PMP ALCOHOL METABOLITE (ETG): Ethyl Glucuronide (EtG): NEGATIVE ng/mL

## 2015-01-01 MED ORDER — BUPROPION HCL ER (XL) 150 MG PO TB24: 450.0000 mg | ORAL_TABLET | Freq: Every day | ORAL | Status: DC

## 2015-01-01 MED ORDER — CLONAZEPAM 0.5 MG PO TABS: 0.5000 mg | ORAL_TABLET | Freq: Three times a day (TID) | ORAL | Status: DC

## 2015-01-01 MED ORDER — TRAZODONE HCL 50 MG PO TABS: 50.0000 mg | ORAL_TABLET | Freq: Every day | ORAL | Status: DC

## 2015-01-01 MED ORDER — SERTRALINE HCL 50 MG PO TABS: 50.0000 mg | ORAL_TABLET | Freq: Every day | ORAL | Status: DC

## 2015-01-01 NOTE — Progress Notes (Addendum)
[facility] MD Progress Note  01/01/2015 3:25 PM [name]  MRN:  [id number] Subjective:  Well The patient is actually doing fairly well. She denies daily depression. She says since she saw me she slowly made progress. She is sleeping well but having problems eating due to a GI illness. She's being evaluated for abdominal pain. The patient does admit that she gets fatigued easily. She is concentrating fairly well. She denies feeling worthless. She denies being suicidal. She doesn't seem to enjoy all that much but she denies complete anhedonia. She enjoys being with her 68 her old grandson. The patient does not seem to get much enjoyment from reading her TV. She seems to be fairly happy in her marriage. The patient denies being oversedated. She denies psychotic symptoms. The patient denies anxiety is that is improved taking Klonopin on a regular basis. At this time is clear that she's moving in the right direction. She has to define some purpose and meaning in her life and she is working on that issue with her therapist here. The patient's depression this year came seemingly added nowhere. I think her passes to figure out what stimulated and led to her downfall in her mood state. Partially she did respond to inpatient care and to medication adjustments. At this time she is stable and continues to slowly improve. Principal Problem: Major depression disorder, recurrent severe without psychosis Diagnosis:   Patient Active Problem List   Diagnosis Date Noted  . Degenerative lumbar disc [M51.36] 12/30/2014  . Cough [R05]   . MDD (major depressive disorder), recurrent severe, without psychosis [F33.2] 11/11/2014  . Major depressive disorder, recurrent severe without psychotic features [F33.2]   . Left wrist pain [M25.532] 09/13/2014  . Right carpal tunnel syndrome [G56.01] 09/13/2014  . Degenerative joint disease involving multiple joints on both sides of body (right foot pain) [M15.9] 08/14/2014  . Spondylosis of  lumbar region without myelopathy or radiculopathy [M47.816] 08/06/2014  . Lumbosacral spondylosis without myelopathy [M47.817] 07/09/2014  . 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 TMJ (dislocation of temporomandibular joint) [S03.0XXA] 06/04/2014  . Chronic low back pain [M54.5, G89.29] 04/15/2014  . Periumbilical hernia [K42.9] 02/02/2014  . Hot flashes [N95.1] 01/02/2014  . Diastolic CHF [I50.30] 07/11/2013  . Major depressive disorder, recurrent episode, unspecified [F33.9] 06/20/2013  . Fibromyalgia syndrome [M79.7] 06/18/2013  . Abdominal pain, epigastric [R10.13] 03/29/2013  . Myalgia [M79.1] 12/21/2012  . Chronic chest pain [R07.9, G89.29] 11/21/2012  . Carpal tunnel syndrome, bilateral [G56.01, G56.02] 07/25/2012  . Tension headache, chronic [G44.229] 05/24/2012  . Iron deficiency anemia [D50.9] 09/22/2011  . Depression [F32.9] 07/14/2011  . Anxiety [F41.9] 06/02/2011  . CKD (chronic kidney disease) stage 3, GFR 30-59 ml/min [N18.3] 01/22/2011  . Lumbar spondylosis with myelopathy [M47.16] 11/20/2010  . Coronary atherosclerosis [I25.10] 03/10/2010  . Dyslipidemia [E78.5] 12/11/2008  . VITAMIN D DEFICIENCY [E55.9] 11/26/2008  . Uncontrolled type 2 diabetes mellitus with insulin therapy [E11.65] 05/25/2007  . PERIPHERAL NEUROPATHY [G60.9] 05/25/2007  . Essential hypertension [I10] 05/25/2007  . GERD [K21.9] 05/25/2007  . SYMPTOM, APNEA, SLEEP NOS [G47.30] 05/25/2007   Total Time spent with patient: 30 minutes   Past Medical History:  Past Medical History  Diagnosis Date  . Hypertension   . Peripheral neuropathy   . Cervicalgia   . Low back pain syndrome   . Hyperlipidemia   . Vitamin D deficiency   . Vitamin B12 deficiency   . Iliotibial band syndrome   . Anemia   . Borderline personality disorder   .  Nonorganic psychosis   . GERD (gastroesophageal reflux disease)   . Diabetes mellitus   . Depression   . Diabetic gastroparesis   . CAD (coronary artery disease)      Catherization 11/18/09>nonobstructive CAD , normal LV function, EF 65%  . Health maintenance examination     Colonoscopy 2009> normal; Diabetic eye exam 12/2008. No diabetic retinopathy; Mammogram 11/10> No evidence of malignancy, DXA 03/14/13 : normal  . Headache(784.0)   . Arthritis   . SBO (small bowel obstruction) 01/09/2013  . Diastolic CHF     Grade I, on Echo 06/2013, EF 55-60%  . Sleep apnea     does not wear CPAP   . Diabetes mellitus, type II   . Wears glasses   . Wears dentures     upper  . Gout   . Right carpal tunnel syndrome 09/13/2014    Past Surgical History  Procedure Laterality Date  . Abdominal hysterectomy    . Hand surgery  1992    rt  . Bowel resection N/A 01/10/2013    Procedure: SMALL BOWEL RESECTION;  Surgeon: Lodema PilotBrian Layton, DO;  Location: [facility];  Service: General;  Laterality: N/A;  . Lysis of adhesion N/A 01/10/2013    Procedure: LYSIS OF ADHESION;  Surgeon: Lodema PilotBrian Layton, DO;  Location: [facility];  Service: General;  Laterality: N/A;  . Laparotomy N/A 01/10/2013    Procedure: EXPLORATORY LAPAROTOMY;  Surgeon: Lodema PilotBrian Layton, DO;  Location: [facility];  Service: General;  Laterality: N/A;  . Incisional hernia repair N/A 03/01/2014    Procedure: LAPAROSCOPIC INCISIONAL HERNIA;  Surgeon: Axel FillerArmando Ramirez, MD;  Location: [facility];  Service: General;  Laterality: N/A;  . Insertion of mesh N/A 03/01/2014    Procedure: INSERTION OF MESH;  Surgeon: Axel FillerArmando Ramirez, MD;  Location: [facility];  Service: General;  Laterality: N/A;  . Hernia repair  03/01/14    Lap incisional hernia repair w/mesh  . Appendectomy    . Carpal tunnel release Right 09/13/2014    Procedure: RIGHT WRIST CARPAL TUNNEL RELEASE;  Surgeon: Eulas PostJoshua P Landau, MD;  Location: [facility];  Service: Orthopedics;  Laterality: Right;  . Steriod injection Left 09/13/2014    Procedure: STEROID INJECTION LEFT HAND;  Surgeon: Eulas PostJoshua P Landau, MD;  Location: [facility];  Service: Orthopedics;  Laterality:  Left;   Family History:  Family History  Problem Relation Age of Onset  . Diabetes Mother   . Hypertension Mother   . Hyperlipidemia Mother   . Arthritis Mother   . Depression Mother   . Heart disease Father   . Diabetes Brother   . Heart disease Brother   . Heart disease Paternal Grandmother   . Diabetes Brother    Social History:  History  Alcohol Use No     History  Drug Use No    History   Social History  . Marital Status: Married    Spouse Name: N/A  . Number of Children: N/A  . Years of Education: N/A   Social History Main Topics  . Smoking status: Former Smoker    Types: Cigarettes    Quit date: 10/25/2002  . Smokeless tobacco: Never Used  . Alcohol Use: No  . Drug Use: No  . Sexual Activity: Not Currently   Other Topics Concern  . None   Social History Narrative   Additional History:    Sleep: Good  Appetite:  Fair   Assessment:   Musculoskeletal: Strength & Muscle Tone:  Gait & Station:  Patient leans:    Psychiatric Specialty Exam: Physical Exam  ROS  Blood pressure 111/70, pulse 74, height 5\' 5"  (1.651 m), weight 199 lb (90.266 kg).Body mass index is 33.12 kg/(m^2).  General Appearance: Casual  Eye Contact::  Good  Speech:  Clear and Coherent  Volume:  Normal  Mood:  Euthymic  Affect:  Congruent  Thought Process:  Coherent  Orientation:  Full (Time, Place, and Person)  Thought Content:  WDL  Suicidal Thoughts:  No  Homicidal Thoughts:  No  Memory:  NA  Judgement:  Good  Insight:  Good  Psychomotor Activity:  Normal  Concentration:  Good  Recall:  Fair  Fund of Knowledge:Good  Language: Good  Akathisia:  No  Handed:  Right  AIMS (if indicated):     Assets:  Desire for Improvement  ADL's:  Intact  Cognition: WNL  Sleep:        Current Medications: Current Outpatient Prescriptions  Medication Sig Dispense Refill  . ACCU-CHEK FASTCLIX LANCETS MISC TEST five times a day 102 each 2  . acetaminophen-codeine (TYLENOL  #4) 300-60 MG per tablet Take 2 tablets by mouth 2 (two) times daily as needed for moderate pain. 120 tablet 2  . albuterol (PROVENTIL HFA;VENTOLIN HFA) 108 (90 BASE) MCG/ACT inhaler Inhale 2 puffs into the lungs every 6 (six) hours as needed for wheezing or shortness of breath. 1 Inhaler 2  . aspirin 81 MG chewable tablet Chew 81 mg by mouth daily.    Marland Kitchen buPROPion (WELLBUTRIN XL) 150 MG 24 hr tablet Take 3 tablets (450 mg total) by mouth daily. 90 tablet 3  . clonazePAM (KLONOPIN) 0.5 MG tablet Take 1 tablet (0.5 mg total) by mouth 3 (three) times daily. For anxiety 270 tablet 0  . cloNIDine (CATAPRES - DOSED IN MG/24 HR) 0.3 mg/24hr patch Place 1 patch (0.3 mg total) onto the skin every 7 (seven) days. 4 patch 11  . dexlansoprazole (DEXILANT) 60 MG capsule Take 1 capsule (60 mg total) by mouth daily. For reflux 30 capsule   . diazepam (VALIUM) 5 MG tablet Take 1 tablet by mouth. Take prior to injection  0  . Docusate Sodium (COLACE PO) Take 2 tablets by mouth daily as needed (for constipation).     . furosemide (LASIX) 40 MG tablet Take 1 tablet (40 mg total) by mouth every morning. 30 tablet 3  . glucose blood (ACCU-CHEK AVIVA PLUS) test strip Check blood sugar up to 4 times a day as instructed 125 each 5  . insulin aspart (NOVOLOG FLEXPEN) 100 UNIT/ML FlexPen inject with meals and snacks up to 8 times a day as directed by your aviva glucometer. (Patient taking differently: Inject 6-21 Units into the skin. inject with meals and snacks up to 8 times a day as directed by your aviva glucometer. Sliding scale) 30 mL 11  . Insulin Glargine (LANTUS SOLOSTAR) 100 UNIT/ML Solostar Pen Inject 51 Units into the skin at bedtime. 15 mL 11  . Insulin Pen Needle (BD PEN NEEDLE NANO U/F) 32G X 4 MM MISC USE TO TEST UP TO 5 TIMES A DAY ACCORDING TO CBG 100 each 4  . isosorbide mononitrate (IMDUR) 60 MG 24 hr tablet Take 1 tablet (60 mg total) by mouth every morning. 30 tablet 6  . LORazepam (ATIVAN) 0.5 MG tablet  Take 0.5 mg by mouth 2 (two) times daily as needed.  0  . losartan (COZAAR) 100 MG tablet Take 50 mg by mouth every morning.    Marland Kitchen  metoprolol succinate (TOPROL-XL) 25 MG 24 hr tablet Take 1 tablet (25 mg total) by mouth every morning. 30 tablet 6  . ondansetron (ZOFRAN) 4 MG tablet Take 4 mg by mouth every 8 (eight) hours as needed for nausea.     . rosuvastatin (CRESTOR) 10 MG tablet Take 1 tablet (10 mg total) by mouth at bedtime. 30 tablet 11  . senna (SENOKOT) 8.6 MG TABS Take 4 tablets by mouth daily as needed (for constipation).     . sertraline (ZOLOFT) 50 MG tablet Take 1 tablet (50 mg total) by mouth daily. 90 tablet 0  . sucralfate (CARAFATE) 1 G tablet Take 1 tablet (1 g total) by mouth 4 (four) times daily -  with meals and at bedtime. 90 tablet 0  . tiZANidine (ZANAFLEX) 2 MG tablet Take 1 tablet (2 mg total) by mouth every 8 (eight) hours as needed for muscle spasms. 90 tablet 1  . traZODone (DESYREL) 50 MG tablet Take 1 tablet (50 mg total) by mouth at bedtime. 90 tablet 0   No current facility-administered medications for this visit.    Lab Results: No results found for this or any previous visit (from the past 48 hour(s)).  Physical Findings: AIMS:  , ,  ,  ,    CIWA:    COWS:     Treatment Plan Summary: Overall the patient seems to be doing quite well. She'll continue taking Klonopin 0.5 mg 3 times a day continue Wellbutrin 450 in the morning and Zoloft 50 mg at night. We will not add BuSpar. The patient continue in therapy and return to see me in 3 months. Overall the patient has shown slow but progressive improvement.   Medical Decision Making:  Review of Medication Regimen & Side Effects (2)     Wende Longstreth IRVING 01/01/2015, 3:25 PM

## 2015-01-01 NOTE — Progress Notes (Signed)
   THERAPIST PROGRESS NOTE  Session Time: 12:30 - 1:30  Participation Level: Active  Behavioral Response: Neat and Well GroomedAlertDepressed  Type of Therapy: Individual Therapy  Treatment Goals addressed: Improve psychiatric symptoms,  Interventions: CBT and Motivational Interviewing  Summary: [name] is a 64 y.o. female who presents with Major Depressive Disorder.   Suicidal/Homicidal: Nowithout intent/plan  Therapist Response:  [name] met with clinician for an individual session. [name] shared about her psychiatric symptoms, her current life events and her homework. [name] shared that her and her husband were in the physical process of moving. She shard that the new apartment will allow her to have her room away from the tv room which makes her happy. She shared that they are 1/2 moved and that she will now be two blocks away from her mother whom she takes care of. She was back to back with her before the move. She shared that she has been more active during the move, she reports that she also attended church and spoke with church friends. While she reports she is still depressed she has been being more social. She shared some about her relationship challenges. She discussed some interactions she has with her husband that irritates her. She shared that she would like more conversation with him. Client and clinician discussed her approach to him and ways she may improve her side of the issue. Client and clinician discussed [name]'s homework. [name] shared some entries from her gratitude journal, she reported that the practice makes it easier to see the good things in her life. [name] shared that she had used the grounding techniques to control her anger with her husband. Client and clinician discussed some anger management techniques. [name] and clinician discussed her happiness. She shared some of her thoughts on the keys to happiness and ways she can improve. [name] agreed to continue her  homework until next session.  Plan: Return again in 1-2 weeks.  Diagnosis: Axis I: Major Depressive Disorder      Berit Raczkowski A, LCSW 01/01/2015

## 2015-01-02 LAB — BENZODIAZEPINES (GC/LC/MS), URINE
Alprazolam metabolite (GC/LC/MS), ur confirm: NEGATIVE ng/mL (ref <25)
Clonazepam metabolite (GC/LC/MS), ur confirm: 212 ng/mL (ref <25)
Flurazepam metabolite (GC/LC/MS), ur confirm: NEGATIVE ng/mL (ref <50)
LORAZEPAMU: NEGATIVE ng/mL (ref <50)
Midazolam (GC/LC/MS), ur confirm: NEGATIVE ng/mL (ref <50)
NORDIAZEPAMU: NEGATIVE ng/mL (ref <50)
OXAZEPAMU: NEGATIVE ng/mL (ref <50)
Temazepam (GC/LC/MS), ur confirm: NEGATIVE ng/mL (ref <50)
Triazolam metabolite (GC/LC/MS), ur confirm: NEGATIVE ng/mL (ref <50)

## 2015-01-02 LAB — OPIATES/OPIOIDS (LC/MS-MS)
Codeine Urine: 42180 ng/mL — AB (ref <50)
Hydrocodone: NEGATIVE ng/mL (ref <50)
Hydromorphone: NEGATIVE ng/mL (ref <50)
Morphine Urine: 386 ng/mL — AB (ref <50)
NOROXYCODONE, UR: NEGATIVE ng/mL (ref <50)
Norhydrocodone, Ur: 247 ng/mL — AB (ref <50)
OXYMORPHONE, URINE: NEGATIVE ng/mL (ref <50)
Oxycodone, ur: NEGATIVE ng/mL (ref <50)

## 2015-01-03 LAB — PRESCRIPTION MONITORING PROFILE (SOLSTAS)
AMPHETAMINE/METH: NEGATIVE ng/mL
Barbiturate Screen, Urine: NEGATIVE ng/mL
Buprenorphine, Urine: NEGATIVE ng/mL
CREATININE, URINE: 85.46 mg/dL (ref >20.0)
Cannabinoid Scrn, Ur: NEGATIVE ng/mL
Carisoprodol, Urine: NEGATIVE ng/mL
Cocaine Metabolites: NEGATIVE ng/mL
Fentanyl, Ur: NEGATIVE ng/mL
MDMA URINE: NEGATIVE ng/mL
METHADONE SCREEN, URINE: NEGATIVE ng/mL
Meperidine, Ur: NEGATIVE ng/mL
NITRITES URINE, INITIAL: NEGATIVE ug/mL
OXYCODONE SCRN UR: NEGATIVE ng/mL
PH URINE, INITIAL: 5 pH (ref 4.5–8.9)
PROPOXYPHENE: NEGATIVE ng/mL
TAPENTADOLUR: NEGATIVE ng/mL
TRAMADOL UR: NEGATIVE ng/mL
Zolpidem, Urine: NEGATIVE ng/mL

## 2015-01-08 ENCOUNTER — Ambulatory Visit (INDEPENDENT_AMBULATORY_CARE_PROVIDER_SITE_OTHER): Payer: Medicare Other | Admitting: Internal Medicine

## 2015-01-08 VITALS — BP 142/66 | HR 73 | Temp 98.2°F | Wt 203.3 lb

## 2015-01-08 DIAGNOSIS — E1165 Type 2 diabetes mellitus with hyperglycemia: Secondary | ICD-10-CM

## 2015-01-08 DIAGNOSIS — Z794 Long term (current) use of insulin: Secondary | ICD-10-CM | POA: Diagnosis not present

## 2015-01-08 DIAGNOSIS — I10 Essential (primary) hypertension: Secondary | ICD-10-CM

## 2015-01-08 DIAGNOSIS — IMO0002 Reserved for concepts with insufficient information to code with codable children: Secondary | ICD-10-CM

## 2015-01-08 LAB — GLUCOSE, CAPILLARY
GLUCOSE-CAPILLARY: 55 mg/dL — AB (ref 70–99)
GLUCOSE-CAPILLARY: 72 mg/dL (ref 70–99)

## 2015-01-08 LAB — POCT GLYCOSYLATED HEMOGLOBIN (HGB A1C): Hemoglobin A1C: 7.8

## 2015-01-08 MED ORDER — SITAGLIPTIN PHOSPHATE 50 MG PO TABS: 50.0000 mg | ORAL_TABLET | Freq: Every day | ORAL | Status: DC

## 2015-01-08 MED ORDER — LOSARTAN POTASSIUM 100 MG PO TABS: 50.0000 mg | ORAL_TABLET | Freq: Every morning | ORAL | Status: DC

## 2015-01-08 MED ORDER — INSULIN LISPRO 100 UNIT/ML (KWIKPEN): PEN_INJECTOR | SUBCUTANEOUS | Status: DC

## 2015-01-08 NOTE — Patient Instructions (Signed)
Make sure to schedule a follow up with Lupita Leash to check your glucometer in 1 week.    Sitagliptin oral tablet What is this medicine? SITAGLIPTIN (sit a GLIP tin) helps to treat type 2 diabetes. It helps to control blood sugar. Treatment is combined with diet and exercise. This medicine may be used for other purposes; ask your health care provider or pharmacist if you have questions. COMMON BRAND NAME(S): Januvia What should I tell my health care provider before I take this medicine? They need to know if you have any of these conditions: -diabetic ketoacidosis -kidney disease -pancreatitis -previous swelling of the tongue, face, or lips with difficulty breathing, difficulty swallowing, hoarseness, or tightening of the throat -type 1 diabetes -an unusual or allergic reaction to sitagliptin, other medicines, foods, dyes, or preservatives -pregnant or trying to get pregnant -breast-feeding How should I use this medicine? Take this medicine by mouth with a glass of water. Follow the directions on the prescription label. You can take it with or without food. Do not cut, crush or chew this medicine. Take your dose at the same time each day. Do not take more often than directed. Do not stop taking except on your doctor's advice. Talk to your pediatrician regarding the use of this medicine in children. Special care may be needed. Overdosage: If you think you have taken too much of this medicine contact a poison control center or emergency room at once. NOTE: This medicine is only for you. Do not share this medicine with others. What if I miss a dose? If you miss a dose, take it as soon as you can. If it is almost time for your next dose, take only that dose. Do not take double or extra doses. What may interact with this medicine? Do not take this medicine with any of the following medications: -gatifloxacin This medicine may also interact with the following  medications: -alcohol -digoxin -insulin -sulfonylureas like glimepiride, glipizide, glyburide This list may not describe all possible interactions. Give your health care provider a list of all the medicines, herbs, non-prescription drugs, or dietary supplements you use. Also tell them if you smoke, drink alcohol, or use illegal drugs. Some items may interact with your medicine. What should I watch for while using this medicine? Visit your doctor or health care professional for regular checks on your progress. A test called the HbA1C (A1C) will be monitored. This is a simple blood test. It measures your blood sugar control over the last 2 to 3 months. You will receive this test every 3 to 6 months. Learn how to check your blood sugar. Learn the symptoms of low and high blood sugar and how to manage them. Always carry a quick-source of sugar with you in case you have symptoms of low blood sugar. Examples include hard sugar candy or glucose tablets. Make sure others know that you can choke if you eat or drink when you develop serious symptoms of low blood sugar, such as seizures or unconsciousness. They must get medical help at once. Tell your doctor or health care professional if you have high blood sugar. You might need to change the dose of your medicine. If you are sick or exercising more than usual, you might need to change the dose of your medicine. Do not skip meals. Ask your doctor or health care professional if you should avoid alcohol. Many nonprescription cough and cold products contain sugar or alcohol. These can affect blood sugar. Wear a medical ID bracelet or  chain, and carry a card that describes your disease and details of your medicine and dosage times. What side effects may I notice from receiving this medicine? Side effects that you should report to your doctor or health care professional as soon as possible: -allergic reactions like skin rash, itching or hives, swelling of the face,  lips, or tongue -breathing problems -fever, chills -loss of appetite -signs and symptoms of low blood sugar such as feeling anxious, confusion, dizziness, increased hunger, unusually weak or tired, sweating, shakiness, cold, irritable, headache, blurred vision, fast heartbeat, loss of consciousness -unusual stomach pain or discomfort -vomiting Side effects that usually do not require medical attention (report to your doctor or health care professional if they continue or are bothersome): -diarrhea -headache -sore throat -stomach upset -stuffy or runny nose This list may not describe all possible side effects. Call your doctor for medical advice about side effects. You may report side effects to FDA at 1-800-FDA-1088. Where should I keep my medicine? Keep out of the reach of children. Store at room temperature between 15 and 30 degrees C (59 and 86 degrees F). Throw away any unused medicine after the expiration date. NOTE: This sheet is a summary. It may not cover all possible information. If you have questions about this medicine, talk to your doctor, pharmacist, or health care provider.  2015, [employer]. (2013-01-24 12:04:01)

## 2015-01-08 NOTE — Assessment & Plan Note (Signed)
BP Readings from Last 3 Encounters:  01/08/15 142/66  01/01/15 111/70  12/30/14 119/63    Lab Results  Component Value Date   NA 136 12/21/2014   K 4.6 12/21/2014   CREATININE 1.65* 12/21/2014    Assessment: Blood pressure control:  controlled Progress toward BP goal:   at goal  Comments: requesting refill of losartan Plan: Medications:  continue current medications Educational resources provided:   Self management tools provided:   Other plans: refilled losartan 50mg  daily.

## 2015-01-08 NOTE — Assessment & Plan Note (Addendum)
Lab Results  Component Value Date   HGBA1C 7.8 01/08/2015   HGBA1C 9.3 10/23/2014   HGBA1C 9.5 08/14/2014     Assessment: Diabetes control:  not controlled Progress toward A1C goal:   improved Comments: States she has a decreased appetite, called the clinic b/c she was not feeling well and was not taking her insulin. Nurse had her check her CBG and it was 199. She was then instructed to go to the ED for further eval as clinic was closing early. In the ED she received a CT of abd that was neg and d/c'd home with sulfacrate. Has not eaten anything since breakfast this morning. CBG was 47 this morning when she checked it and she drank a soda. States her weight her weight fluctuates but despite a decrease in appetite pts weight has remained stable. Today her weight is 203 and on 3/9 weight was 199lbs. She is taking lantus 51 units qhs and novolog TID based on her carb counting method. Interested in invokona however CI due to low GFR. CBG check in clinic was 17. Given orange juice and CBG rechecked and was 72  Glucometer report ranges from 40-252. Low CBGs mainly at lunch and the evenings.   Plan: Medications: started on januvia $RemoveBe'50mg'CKAcSLVCn$  daily ( renal dose as creatinine clearance is 49.9). Continue lantus 51 qhs. Switched novolog flex pen to [employer] due to insurance coverage. Met with Butch Penny during exam and changed glucometer carb counting settings to adjust for low CBGs in the afternoon and dinner.  Home glucose monitoring: Frequency:  QID Timing:  w/meals and qhs Instruction/counseling given: reminded to bring blood glucose meter & log to each visit and provided printed educational material Other plans: Pt to follow up in person with Butch Penny in 1 week and f/u in clinic in 1 month for diabetes

## 2015-01-08 NOTE — Progress Notes (Signed)
   Subjective:    Patient ID: [name], female    DOB: Jan 29, 1951, 64 y.o.   MRN: [id number]  HPI Pt is a 64 y/o female w/ PMHx of HTN, CAD, GERD, b/l carpal tunnel syndrome, anxiety, and depression who presents for diabetes f/u. Please see problem list for further details.     Review of Systems  Constitutional: Positive for appetite change (decreased). Negative for unexpected weight change.  Endocrine: Negative for polydipsia and polyuria.  Musculoskeletal:       Left hand numbness- chronic due to carpal tunnel       Objective:   Physical Exam  Constitutional: She appears well-developed and well-nourished.  HENT:  Mouth/Throat: Oropharynx is clear and moist. No oropharyngeal exudate.  Cardiovascular: Normal rate and regular rhythm.   Pulmonary/Chest: Effort normal and breath sounds normal.  Abdominal: Soft. Bowel sounds are normal. There is no tenderness.  Skin: Skin is warm and dry.          Assessment & Plan:  Please see problem based assessment and plan.

## 2015-01-08 NOTE — Progress Notes (Signed)
Hypoglycemic Event  CBG: 55  Treatment: 15 GM carbohydrate snack  Symptoms: None  Follow-up CBG: Time:.1422 CBG Result:72  Possible Reasons for Event: Inadequate meal intake  Comments/MD notified:DrTruong    Joslyn DevonGoldston, Darlene Cassady  Remember to initiate Hypoglycemia Order Set & complete

## 2015-01-08 NOTE — Progress Notes (Signed)
Patient's Accu-chek Aviva Expert Meter setting adjusted today: time frame from 12 midnight to 1:30 Pm            CIR: carb to insulin ration changed from 1unit to 6 grams to 1unit to :8 grams     CF correction factor changed form 1unit:20mg /dl to 1unit:23mg /dl

## 2015-01-09 ENCOUNTER — Ambulatory Visit (HOSPITAL_COMMUNITY): Payer: Self-pay | Admitting: Clinical

## 2015-01-10 NOTE — Progress Notes (Signed)
Urine drug screen for this encounter is consistent for prescribed medication 

## 2015-01-10 NOTE — Progress Notes (Signed)
Internal Medicine Clinic Attending  Case discussed with Dr. Truong at the time of the visit.  We reviewed the resident's history and exam and pertinent patient test results.  I agree with the assessment, diagnosis, and plan of care documented in the resident's note.  

## 2015-01-14 ENCOUNTER — Telehealth: Payer: Self-pay | Admitting: Dietician

## 2015-01-14 ENCOUNTER — Other Ambulatory Visit: Payer: Self-pay | Admitting: Gastroenterology

## 2015-01-14 ENCOUNTER — Other Ambulatory Visit: Payer: Self-pay

## 2015-01-14 VITALS — BP 140/70 | HR 72 | Resp 16 | Ht 65.0 in | Wt 195.2 lb

## 2015-01-14 DIAGNOSIS — I509 Heart failure, unspecified: Secondary | ICD-10-CM

## 2015-01-14 DIAGNOSIS — R1084 Generalized abdominal pain: Secondary | ICD-10-CM

## 2015-01-14 NOTE — Telephone Encounter (Signed)
Call to patient to confirm appointment for 01/15/15 at 8:30 lmtcb

## 2015-01-14 NOTE — Patient Instructions (Addendum)
Heart Failure   Heart failure is a condition in which the heart has trouble pumping blood. This means your heart does not pump blood efficiently for your body to work well. In some cases of heart failure, fluid may back up into your lungs or you may have swelling (edema) in your lower legs. Heart failure is usually a long-term (chronic) condition. It is important for you to take good care of yourself and follow your health care provider's treatment plan. CAUSES  Some health conditions can cause heart failure. Those health conditions include:  High blood pressure (hypertension). Hypertension causes the heart muscle to work harder than normal. When pressure in the blood vessels is high, the heart needs to pump (contract) with more force in order to circulate blood throughout the body. High blood pressure eventually causes the heart to become stiff and weak.  Coronary artery disease (CAD). CAD is the buildup of cholesterol and fat (plaque) in the arteries of the heart. The blockage in the arteries deprives the heart muscle of oxygen and blood. This can cause chest pain and may lead to a heart attack. High blood pressure can also contribute to CAD.  Heart attack (myocardial infarction). A heart attack occurs when one or more arteries in the heart become blocked. The loss of oxygen damages the muscle tissue of the heart. When this happens, part of the heart muscle dies. The injured tissue does not contract as well and weakens the heart's ability to pump blood.  Abnormal heart valves. When the heart valves do not open and close properly, it can cause heart failure. This makes the heart muscle pump harder to keep the blood flowing.  Heart muscle disease (cardiomyopathy or myocarditis). Heart muscle disease is damage to the heart muscle from a variety of causes. These can include drug or alcohol abuse, infections, or unknown reasons. These can increase the risk of heart failure.  Lung disease. Lung disease  makes the heart work harder because the lungs do not work properly. This can cause a strain on the heart, leading it to fail.  Diabetes. Diabetes increases the risk of heart failure. High blood sugar contributes to high fat (lipid) levels in the blood. Diabetes can also cause slow damage to tiny blood vessels that carry important nutrients to the heart muscle. When the heart does not get enough oxygen and food, it can cause the heart to become weak and stiff. This leads to a heart that does not contract efficiently.  Other conditions can contribute to heart failure. These include abnormal heart rhythms, thyroid problems, and low blood counts (anemia). Certain unhealthy behaviors can increase the risk of heart failure, including:  Being overweight.  Smoking or chewing tobacco.  Eating foods high in fat and cholesterol.  Abusing illicit drugs or alcohol.  Lacking physical activity. SYMPTOMS  Heart failure symptoms may vary and can be hard to detect. Symptoms may include:  Shortness of breath with activity, such as climbing stairs.  Persistent cough.  Swelling of the feet, ankles, legs, or abdomen.  Unexplained weight gain.  Difficulty breathing when lying flat (orthopnea).  Waking from sleep because of the need to sit up and get more air.  Rapid heartbeat.  Fatigue and loss of energy.  Feeling light-headed, dizzy, or close to fainting.  Loss of appetite.  Nausea.  Increased urination during the night (nocturia). DIAGNOSIS  A diagnosis of heart failure is based on your history, symptoms, physical examination, and diagnostic tests. Diagnostic tests for heart failure may  include:  Echocardiography.  Electrocardiography.  Chest X-ray.  Blood tests.  Exercise stress test.  Cardiac angiography.  Radionuclide scans. TREATMENT  Treatment is aimed at managing the symptoms of heart failure. Medicines, behavioral changes, or surgical intervention may be necessary to  treat heart failure.  Medicines to help treat heart failure may include:  Angiotensin-converting enzyme (ACE) inhibitors. This type of medicine blocks the effects of a blood protein called angiotensin-converting enzyme. ACE inhibitors relax (dilate) the blood vessels and help lower blood pressure.  Angiotensin receptor blockers (ARBs). This type of medicine blocks the actions of a blood protein called angiotensin. Angiotensin receptor blockers dilate the blood vessels and help lower blood pressure.  Water pills (diuretics). Diuretics cause the kidneys to remove salt and water from the blood. The extra fluid is removed through urination. This loss of extra fluid lowers the volume of blood the heart pumps.  Beta blockers. These prevent the heart from beating too fast and improve heart muscle strength.  Digitalis. This increases the force of the heartbeat.  Healthy behavior changes include:  Obtaining and maintaining a healthy weight.  Stopping smoking or chewing tobacco.  Eating heart-healthy foods.  Limiting or avoiding alcohol.  Stopping illicit drug use.  Physical activity as directed by your health care provider.  Surgical treatment for heart failure may include:  A procedure to open blocked arteries, repair damaged heart valves, or remove damaged heart muscle tissue.  A pacemaker to improve heart muscle function and control certain abnormal heart rhythms.  An internal cardioverter defibrillator to treat certain serious abnormal heart rhythms.  A left ventricular assist device (LVAD) to assist the pumping ability of the heart. HOME CARE INSTRUCTIONS   Take medicines only as directed by your health care provider. Medicines are important in reducing the workload of your heart, slowing the progression of heart failure, and improving your symptoms.  Do not stop taking your medicine unless directed by your health care provider.  Do not skip any dose of medicine.  Refill your  prescriptions before you run out of medicine. Your medicines are needed every day.  Engage in moderate physical activity if directed by your health care provider. Moderate physical activity can benefit some people. The elderly and people with severe heart failure should consult with a health care provider for physical activity recommendations.  Eat heart-healthy foods. Food choices should be free of trans fat and low in saturated fat, cholesterol, and salt (sodium). Healthy choices include fresh or frozen fruits and vegetables, fish, lean meats, legumes, fat-free or low-fat dairy products, and whole grain or high fiber foods. Talk to a dietitian to learn more about heart-healthy foods.  Limit sodium if directed by your health care provider. Sodium restriction may reduce symptoms of heart failure in some people. Talk to a dietitian to learn more about heart-healthy seasonings.  Use healthy cooking methods. Healthy cooking methods include roasting, grilling, broiling, baking, poaching, steaming, or stir-frying. Talk to a dietitian to learn more about healthy cooking methods.  Limit fluids if directed by your health care provider. Fluid restriction may reduce symptoms of heart failure in some people.  Weigh yourself every day. Daily weights are important in the early recognition of excess fluid. You should weigh yourself every morning after you urinate and before you eat breakfast. Wear the same amount of clothing each time you weigh yourself. Record your daily weight. Provide your health care provider with your weight record.  Monitor and record your blood pressure if directed by your  health care provider.  Check your pulse if directed by your health care provider.  Lose weight if directed by your health care provider. Weight loss may reduce symptoms of heart failure in some people.  Stop smoking or chewing tobacco. Nicotine makes your heart work harder by causing your blood vessels to constrict.  Do not use nicotine gum or patches before talking to your health care provider.  Keep all follow-up visits as directed by your health care provider. This is important.  Limit alcohol intake to no more than 1 drink per day for nonpregnant women and 2 drinks per day for men. One drink equals 12 ounces of beer, 5 ounces of wine, or 1 ounces of hard liquor. Drinking more than that is harmful to your heart. Tell your health care provider if you drink alcohol several times a week. Talk with your health care provider about whether alcohol is safe for you. If your heart has already been damaged by alcohol or you have severe heart failure, drinking alcohol should be stopped completely.  Stop illicit drug use.  Stay up-to-date with immunizations. It is especially important to prevent respiratory infections through current pneumococcal and influenza immunizations.  Manage other health conditions such as hypertension, diabetes, thyroid disease, or abnormal heart rhythms as directed by your health care provider.  Learn to manage stress.  Plan rest periods when fatigued.  Learn strategies to manage high temperatures. If the weather is extremely hot:  Avoid vigorous physical activity.  Use air conditioning or fans or seek a cooler location.  Avoid caffeine and alcohol.  Wear loose-fitting, lightweight, and light-colored clothing.  Learn strategies to manage cold temperatures. If the weather is extremely cold:  Avoid vigorous physical activity.  Layer clothes.  Wear mittens or gloves, a hat, and a scarf when going outside.  Avoid alcohol.  Obtain ongoing education and support as needed.  Participate in or seek rehabilitation as needed to maintain or improve independence and quality of life. SEEK MEDICAL CARE IF:   Your weight increases by 03 lb/1.4 kg in 1 day or 05 lb/2.3 kg in a week.  You have increasing shortness of breath that is unusual for you.  You are unable to participate in  your usual physical activities.  You tire easily.  You cough more than normal, especially with physical activity.  You have any or more swelling in areas such as your hands, feet, ankles, or abdomen.  You are unable to sleep because it is hard to breathe.  You feel like your heart is beating fast (palpitations).  You become dizzy or light-headed upon standing up. SEEK IMMEDIATE MEDICAL CARE IF:   You have difficulty breathing.  There is a change in mental status such as decreased alertness or difficulty with concentration.  You have a pain or discomfort in your chest.  You have an episode of fainting (syncope). MAKE SURE YOU:   Understand these instructions.  Will watch your condition.  Will get help right away if you are not doing well or get worse. Document Released: 10/11/2005 Document Revised: 02/25/2014 Document Reviewed: 11/10/2012 [facility] Patient Information 2015 [employer], [place]. This information is not intended to replace advice given to you by your health care provider. Make sure you discuss any questions you have with your health care provider.  Continue to weigh yourself daily. Contact Primary care physician for any signs/symptoms of Heart Failure exacerrbation. Continue to take medications as prescribed. Speak with your doctor to clarify when to take an extra dose  of Furosemide. Continue to Seek low sodium food choice options.

## 2015-01-14 NOTE — Patient Outreach (Signed)
01/14/2015   [name] 1951-09-29 [id number]  Subjective: Member reports doing better and feeling some better. Member states she is getting out more and has been very busy. Member recently moved into another home and is pleased with this move. Member reports that she continues to have stomach pains that are a little better.   Objective: Lungs clear bilaterally, trace edema to feet; ankles puffy, non pitting.   Current Medications: Current Outpatient Prescriptions  Medication Sig Dispense Refill  . acetaminophen-codeine (TYLENOL #4) 300-60 MG per tablet Take 2 tablets by mouth 2 (two) times daily as needed for moderate pain. 120 tablet 2  . albuterol (PROVENTIL HFA;VENTOLIN HFA) 108 (90 BASE) MCG/ACT inhaler Inhale 2 puffs into the lungs every 6 (six) hours as needed for wheezing or shortness of breath. 1 Inhaler 2  . aspirin 81 MG chewable tablet Chew 81 mg by mouth daily.    Marland Kitchen buPROPion (WELLBUTRIN XL) 150 MG 24 hr tablet Take 3 tablets (450 mg total) by mouth daily. 90 tablet 3  . clonazePAM (KLONOPIN) 0.5 MG tablet Take 1 tablet (0.5 mg total) by mouth 3 (three) times daily. For anxiety 270 tablet 0  . cloNIDine (CATAPRES - DOSED IN MG/24 HR) 0.3 mg/24hr patch Place 1 patch (0.3 mg total) onto the skin every 7 (seven) days. 4 patch 11  . dexlansoprazole (DEXILANT) 60 MG capsule Take 1 capsule (60 mg total) by mouth daily. For reflux 30 capsule   . Docusate Sodium (COLACE PO) Take 2 tablets by mouth daily as needed (for constipation).     . furosemide (LASIX) 40 MG tablet Take 1 tablet (40 mg total) by mouth every morning. 30 tablet 3  . furosemide (LASIX) 40 MG tablet Take 40 mg by mouth 2 (two) times daily. Member states she takes  daily, but "on occasion" will take one tablet twice a day when she notices increased swelling in lower extremities. "Maybe twice a week".    Marland Kitchen glucose blood (ACCU-CHEK AVIVA PLUS) test strip Check blood sugar up to 4 times a day as instructed 125 each 5   . insulin aspart (NOVOLOG FLEXPEN) 100 UNIT/ML FlexPen inject with meals and snacks up to 8 times a day as directed by your aviva glucometer. (Patient taking differently: Inject 6-21 Units into the skin. inject with meals and snacks up to 8 times a day as directed by your aviva glucometer. Sliding scale) 30 mL 11  . Insulin Glargine (LANTUS SOLOSTAR) 100 UNIT/ML Solostar Pen Inject 51 Units into the skin at bedtime. 15 mL 11  . Insulin Pen Needle (BD PEN NEEDLE NANO U/F) 32G X 4 MM MISC USE TO TEST UP TO 5 TIMES A DAY ACCORDING TO CBG 100 each 4  . isosorbide mononitrate (IMDUR) 60 MG 24 hr tablet Take 1 tablet (60 mg total) by mouth every morning. 30 tablet 6  . losartan (COZAAR) 100 MG tablet Take 0.5 tablets (50 mg total) by mouth every morning. 90 tablet 4  . metoprolol succinate (TOPROL-XL) 25 MG 24 hr tablet Take 1 tablet (25 mg total) by mouth every morning. 30 tablet 6  . ondansetron (ZOFRAN) 4 MG tablet Take 4 mg by mouth every 8 (eight) hours as needed for nausea.     . rosuvastatin (CRESTOR) 10 MG tablet Take 1 tablet (10 mg total) by mouth at bedtime. 30 tablet 11  . senna (SENOKOT) 8.6 MG TABS Take 4 tablets by mouth daily as needed (for constipation).     . sertraline (ZOLOFT)  50 MG tablet Take 1 tablet (50 mg total) by mouth daily. 90 tablet 0  . sitaGLIPtin (JANUVIA) 50 MG tablet Take 1 tablet (50 mg total) by mouth daily. 90 tablet 0  . traZODone (DESYREL) 50 MG tablet Take 1 tablet (50 mg total) by mouth at bedtime. 90 tablet 0  . ACCU-CHEK FASTCLIX LANCETS MISC TEST five times a day 102 each 2  . insulin lispro (HUMALOG) 100 UNIT/ML KiwkPen Use as directed according to carb counting method (Patient not taking: Reported on 01/14/2015) 15 mL 11  . sucralfate (CARAFATE) 1 G tablet Take 1 tablet (1 g total) by mouth 4 (four) times daily -  with meals and at bedtime. (Patient not taking: Reported on 01/14/2015) 90 tablet 0   No current facility-administered medications for this visit.      Functional Status: In your present state of health, do you have any difficulty performing the following activities: 01/14/2015 01/08/2015  Is the patient deaf or have difficulty hearing? - N  Hearing - N  Vision - N  Difficulty concentrating or making decisions - N  Walking or climbing stairs? - N  Doing errands, shopping? - N  Quarry managerreparing Food and eating ? Y -  Using the Toilet? N -  In the past six months, have you accidently leaked urine? N -  Do you have problems with loss of bowel control? N -  Managing your Medications? N -  Managing your Finances? N -  Housekeeping or managing your Housekeeping? N -  Some encounter information is confidential and restricted. Go to Review Flowsheets activity to see all data.    Fall/Depression Screening: PHQ 2/9 Scores 01/08/2015 11/07/2014 10/23/2014 08/14/2014 06/18/2014 06/04/2014 04/10/2014  PHQ - 2 Score 1 5 6 1 1 1  0  PHQ- 9 Score - 15 15 - - - -   Assessment: 64 year old with multiple medical conditions including diabetes and depression, Chronic pain, stomach pain. Member is being seen by Norm Parcelonna Plyler for diabetes management. Pain clinic managing chronic pain. Member is being followed by behavior health/therapist for depression. Member has had problem with "stomach pain" and is seeing Dr. Matthias HughsBuccini today. [facility] Care [facility] currently seeing member for heart failure education. Member with a history of heart failure, but did not recognize diagnosis until a few months ago.  Plan: Continue heart failure education, Home visit next month.   [facility]        Patient Outreach from 01/14/2015 in [facility]   Care Plan Problem One  knowledge deficit regarding heart failure management   Care Plan for Problem One  Active   Interventions for Problem One Long Term Goal  Reinforced the signs/symptoms of heart failure exacerbation, Reinforced heart failure zone tool   THN Long Term Goal (31-90 days)  member will states zone tool land  at least 1-2 actions associated with each zone within the next 60 days.   THN Long Term Goal Start Date  12/23/14   THN CM Short Term Goal #1 (0-30 days)  Member will call primary care provider with any signs/symptoms of heart faillure exacerbation within the next 30 days   THN CM Short Term Goal #1 Start Date  01/14/15   THN CM Short Term Goal #2 (0-30 days)  Member will call primary care provider to clarify when to take an extra dose of furosemide.   THN CM Short Term Goal #2 Start Date  01/14/15

## 2015-01-15 ENCOUNTER — Ambulatory Visit (INDEPENDENT_AMBULATORY_CARE_PROVIDER_SITE_OTHER): Payer: Medicare Other | Admitting: Dietician

## 2015-01-15 ENCOUNTER — Encounter: Payer: Self-pay | Admitting: Dietician

## 2015-01-15 DIAGNOSIS — Z794 Long term (current) use of insulin: Secondary | ICD-10-CM | POA: Diagnosis not present

## 2015-01-15 DIAGNOSIS — E1165 Type 2 diabetes mellitus with hyperglycemia: Secondary | ICD-10-CM | POA: Diagnosis not present

## 2015-01-15 DIAGNOSIS — IMO0002 Reserved for concepts with insufficient information to code with codable children: Secondary | ICD-10-CM

## 2015-01-15 NOTE — Progress Notes (Signed)
Medical Nutrition Therapy:  Appt start time: 830 end time:  905  Assessment:  Primary concerns today: Blood sugar control.  Patient  continues with low blood sugars midday after recent decrease in both carb to insulin ratio and insulin sensitivity factor in time period 12 am to 130 PM. She would like her fasting blood sugars to be lower. She has been having constant nausea, abdominal discomfort. She saw a gastroenterologiost who suggested senekot every night x2 at bedtime. Patient thinks she has gastroparesis, but note recent stomach emptying scan within nornal limits. Reported intake yesterday 2 hard boiled eggs, strawberries, water, diet pepsi, ribs and cantekope Meter download for past 7 days: average of  112  for  26 readings, range 56-215, 4 in last week < 70 mg.dl. started [name] last week which could be playing a role in mealtime insulin needs, fasting average is ~ 180 which is too high A1C  7.8% , GFR 37   INSULIN:  Current basal is Lantus 51 units/day at bedtime, current bolus average is  ~ 30-35 units for a TDD of 81-86 units, new CIR: 1 unit to 10 from 1200 am to 1350 PM, 1:5 1401 to 2400, new Correction factor: 1 unit lowers CBG ~ 25 mg/dl   Refused weight today.  Progress Towards Goal(s):  Some progress.   Nutritional Diagnosis:  [place]-2.3 Food-medication interaction As related to lack of coordination between food intake and insulin dosages is less improved due to multiple factors As evidenced by her deterioration in  blood sugars,( but seems  less frustrated with being restricted on what and when she eats to have to control her blood sugars).    Intervention:  She is doing well with accu-chek expert meter, a1C dropped significantly since she started using it. Change settings to:  time periods 12midnight to 2 PM and 2:01 pm to 12 midnight,  In 12- 2 Pm time period, changed Carb to insulin ration to 1:10 and insulin sensitivity factor to 1:25 to keep CBGs > 70 mg/dl. Teaching Method  Utilized: Visual, Auditory  Barriers to learning/adherence to lifestyle change: Mood/depression/diabetes distress  Demonstrated degree of understanding via:  Teach Back   Monitoring/Evaluation:  Dietary intake, exercise, blood sugars, and body weight in 10 day(s).to assure no readings< 70 mg/dl

## 2015-01-20 ENCOUNTER — Other Ambulatory Visit: Payer: Self-pay | Admitting: *Deleted

## 2015-01-20 NOTE — Patient Outreach (Signed)
[facility]OST form.  Phone call to patient who confirms that she has completed an Advanced Directive, however is in need of a Medical Orders for Scope of Treatment  (MOST)  form.   This social worker explained that the MOST form once complete must be reviewed and signed by a licensed physician. Home visit to be scheduled in the next two weeks to bring MOST form to patient .  Patient to bring form to her next appointment with her doctor on 02/24/15 to be reviewed and signed.

## 2015-01-21 ENCOUNTER — Other Ambulatory Visit: Payer: Self-pay | Admitting: Gastroenterology

## 2015-01-21 ENCOUNTER — Ambulatory Visit
Admission: RE | Admit: 2015-01-21 | Discharge: 2015-01-21 | Disposition: A | Discharge status: Home / Self Care | Payer: Medicare Other | Source: Ambulatory Visit | Attending: Gastroenterology | Admitting: Gastroenterology

## 2015-01-21 DIAGNOSIS — R1084 Generalized abdominal pain: Secondary | ICD-10-CM

## 2015-01-24 ENCOUNTER — Ambulatory Visit (INDEPENDENT_AMBULATORY_CARE_PROVIDER_SITE_OTHER): Payer: 59 | Admitting: Clinical

## 2015-01-24 ENCOUNTER — Encounter (HOSPITAL_COMMUNITY): Payer: Self-pay | Admitting: Clinical

## 2015-01-24 DIAGNOSIS — F331 Major depressive disorder, recurrent, moderate: Secondary | ICD-10-CM

## 2015-01-24 NOTE — Progress Notes (Signed)
   THERAPIST PROGRESS NOTE  Session Time: 2:30 - 3:28  Participation Level: Active  Behavioral Response: Casual and NeatAlertDepressed  Type of Therapy: Individual Therapy  Treatment Goals addressed: Improve Psychiatric Symptoms, Calming Skills, Emotional Regulation Skills, Interpersonal Relationship Skills  Interventions: Motivational Interviewing, CBT, Grounding & Mindfulness Techniques  Summary: [name] is a 64 y.o. female who presents with Major Depressive Disorder.   Suicidal/Homicidal: No -without intent/plan  Therapist Response:  Issabela met with clinician for an individual session. Ruchel shared about her psychiatric symptoms, her current life events and her homework. Prisha shared that she just got back from the doctor with her husband and that she was concerned about what the doctor found. She shared that she was equally concerned about herself because she continues to have pain in her stomach but the doctors tell her they cannot find anything wrong. Radley also shared other concerns about her husband.  Shaine stated that she continues to be depressed. Client and clinician discussed her attempts to socialize. Irelynn shared about her attempts and also about her inability to share with others what is going on with her due to fear of saying something wrong or bursting out crying. Aaliyana shared that the people she knows wouldn't be good to share with because of relationship dynamics. Kalika shared the things that she would share with a friend if she had a  safe one. Monalisa cried as she spoke. She reported she felt better after speaking.Client and clinician discussed how to interact with others without sharing more than she wants to. Client and clinician discussed some of the things that are going well like getting her house together and her grandson. Ariyannah shared about her gratitude journal and her self care. Lidya agreed to continue her homework as well as to continue to interact with  others.   Plan: Return again in 1-2 weeks.  Diagnosis:     Axis I: Major Depressive Disorder    Valincia Touch A, LCSW 01/24/2015

## 2015-01-29 ENCOUNTER — Other Ambulatory Visit: Payer: Self-pay

## 2015-01-29 NOTE — Patient Outreach (Signed)
CSW called patient for initial telephone encounter. CSW confirmed that patient was interested in receiving a most form. CSW also confirmed patient was interested in learning more about advanced directives. CSW scheduled a initial home visit for 02/07/15. CSW will complete initial assessment at next home visit.

## 2015-02-03 ENCOUNTER — Telehealth: Payer: Self-pay

## 2015-02-03 NOTE — Patient Outreach (Signed)
[facility]) Care Management  02/03/2015  [name] 1951-10-13 [id number]   CSW called to inform patient that [facility] not [facility]. CSW provided patient information about how to access this list of providers. CSW explained that because patient did not meet criteria that CSW would need to discharge. Patient was understanding and reported that she would follow-up with her doctor.

## 2015-02-04 ENCOUNTER — Ambulatory Visit: Payer: Self-pay

## 2015-02-04 ENCOUNTER — Other Ambulatory Visit: Payer: Self-pay

## 2015-02-04 NOTE — Patient Outreach (Signed)
[name]) Care Management  [id number]  [name] [id number]  Assessment: Discharge call-Care manager was following member to instruct reinforce heart failure education. Member states she has been weighing self daily. Weight today 193 pounds. Member states she was 193.4 on yesterday. Member states weight at last visit was 196. Care manager reviewed and reinforced signs/symptoms of heart failure exacerbation and the zone tool. Member reports she has a copy of the zone tool to refer to if needed. Member reports a physician assistant from [employer] will be coming out to see her tomorrow and she is going to be joining a program for a Transport planner to follow her from [employer]. No additional care management needs identified at this time.   Care Manager will close case and send letter to primary care.  Norlina        Patient Outreach Telephone from 02/04/2015 in [place] for Problem One  Not Active   Interventions for Problem One Long Term Goal  Reinforced the signs/symptoms of heart failure exacerbation, Reinforced heart failure zone tool. Reinforced importance of continued follow up with all providers as needed.   [facility]N Long Term Goal (31-90 days)  member will states zone tool land at least 1-2 actions associated with each zone within the next 60 days.   THN Long Term Goal Start Date  12/23/14   THN Long Term Goal Met Date  02/04/15   [facility] CM Short Term Goal #1 (0-30 days)  Member will call primary care provider with any signs/symptoms of heart faillure exacerbation within the next 30 days   THN CM Short Term Goal #1 Start Date  01/14/15   [facility] CM Short Term Goal #1 Met Date  02/04/15 [no signs/symptoms of heart failure exacerbation]   [facility]N CM Short Term Goal #2 (0-30 days)  Member will call primary care provider to clarify when to take an extra dose of furosemide.   [facility] CM Short Term Goal #2 Start Date  01/14/15    [facility] CM Short Term Goal #2 Met Date  -- [denies needing extra dose and therefore did not call.]       Thea Silversmith, RN, MSN, [place] Coordinator Cell: [phone]

## 2015-02-06 ENCOUNTER — Encounter (HOSPITAL_COMMUNITY): Payer: Self-pay | Admitting: Clinical

## 2015-02-06 ENCOUNTER — Ambulatory Visit (INDEPENDENT_AMBULATORY_CARE_PROVIDER_SITE_OTHER): Payer: 59 | Admitting: Clinical

## 2015-02-06 ENCOUNTER — Telehealth: Payer: Self-pay | Admitting: *Deleted

## 2015-02-06 ENCOUNTER — Telehealth: Payer: Self-pay | Admitting: Dietician

## 2015-02-06 DIAGNOSIS — F331 Major depressive disorder, recurrent, moderate: Secondary | ICD-10-CM | POA: Diagnosis not present

## 2015-02-06 NOTE — Telephone Encounter (Signed)
Pt called having problems with both hands and forearm - numbness and sore feeling. Appt made 02/10/15 9:15AM Dr Shirlee LatchMcLean. Working with D. Plyler - CBG are low due to appetite - doing sl better now. [name] KidneyDebra Isaid Salvia RN 02/06/15 10:10AM

## 2015-02-06 NOTE — Telephone Encounter (Signed)
Says she is stilling having low blood sugars, is not eating well, wants to see CDE on Monday same day as her doctor's appointment. Appointment scheduled for Monday ~ 945 am, 02-10-15 after she sees the doctor.

## 2015-02-06 NOTE — Progress Notes (Signed)
   THERAPIST PROGRESS NOTE  Session Time: 4:32 - 5:28  Participation Level: Active  Behavioral Response: CasualAlertDepressed  Type of Therapy: Individual Therapy  Treatment Goals addressed: Improve Psychiatric Symptoms,  Emotional Regulation Skills, Interpersonal Relationship Skills  Interventions: Motivational Interviewing  Summary: Damonique Brunelle is a 64 y.o. female who presents with Major Depressive Disorder.   Suicidal/Homicidal: No -without intent/plan  Therapist Response:  Suellyn met with clinician for an individual session. Danielly shared about her psychiatric symptoms, her current life events and her homework. Charliee shared that she continues to practice her grounding techniques and her gratitude journal. She shared that she was able to do some self care this week. She shared that she had been feeling unhappy about having to take so many medications. She shared that sometimes she feels like stopping taking them. Clinician asked what the outcome would be if she stopped and Ahmaya shared that it would not be good, she shared what  She recognized that she is frustrated due to her sense of lack of control over her health, due to a lot pain and unanswered questions about the pain. She shared what the doctors said would happen.Client and clinician discussed what she had the power to change and what she didn't. Ali recognized that how she cared for herself was in her power. Client and clinician discussed when she feels the pain and when she doesn't. She reported that her stomach pain has been constant lately.  Shekinah stated that he grandson isn't staying at their house as much and that she misses him when he's gone. She also discussed that while she has some friends she does not have anyone she feels safe in confiding in. This has led her to feel very lonely and isolated. Kenadi shared that she did speak on the phone with church people. Client and clinician discussed options for building healthy  relationships.    Plan: Return again in 1-2 weeks.  Diagnosis:     Axis I: Major Depressive Disorder    Emarion Toral A, LCSW 02/06/2015

## 2015-02-07 ENCOUNTER — Ambulatory Visit: Payer: Self-pay

## 2015-02-10 ENCOUNTER — Ambulatory Visit (INDEPENDENT_AMBULATORY_CARE_PROVIDER_SITE_OTHER): Payer: Medicare Other | Admitting: Internal Medicine

## 2015-02-10 ENCOUNTER — Encounter: Payer: Self-pay | Admitting: Dietician

## 2015-02-10 ENCOUNTER — Ambulatory Visit (INDEPENDENT_AMBULATORY_CARE_PROVIDER_SITE_OTHER): Payer: Medicare Other | Admitting: Dietician

## 2015-02-10 ENCOUNTER — Encounter: Payer: Self-pay | Admitting: Internal Medicine

## 2015-02-10 VITALS — BP 114/66 | HR 68 | Temp 97.9°F | Ht 65.0 in | Wt 198.6 lb

## 2015-02-10 DIAGNOSIS — IMO0002 Reserved for concepts with insufficient information to code with codable children: Secondary | ICD-10-CM

## 2015-02-10 DIAGNOSIS — R1013 Epigastric pain: Secondary | ICD-10-CM

## 2015-02-10 DIAGNOSIS — E1165 Type 2 diabetes mellitus with hyperglycemia: Secondary | ICD-10-CM

## 2015-02-10 DIAGNOSIS — Z794 Long term (current) use of insulin: Secondary | ICD-10-CM | POA: Diagnosis not present

## 2015-02-10 DIAGNOSIS — R079 Chest pain, unspecified: Secondary | ICD-10-CM | POA: Diagnosis not present

## 2015-02-10 DIAGNOSIS — R200 Anesthesia of skin: Secondary | ICD-10-CM

## 2015-02-10 DIAGNOSIS — R202 Paresthesia of skin: Secondary | ICD-10-CM

## 2015-02-10 DIAGNOSIS — Z713 Dietary counseling and surveillance: Secondary | ICD-10-CM | POA: Diagnosis not present

## 2015-02-10 DIAGNOSIS — I10 Essential (primary) hypertension: Secondary | ICD-10-CM

## 2015-02-10 DIAGNOSIS — G8929 Other chronic pain: Secondary | ICD-10-CM

## 2015-02-10 MED ORDER — ISOSORBIDE MONONITRATE ER 60 MG PO TB24: 60.0000 mg | ORAL_TABLET | Freq: Every morning | ORAL | Status: DC

## 2015-02-10 NOTE — Progress Notes (Signed)
Medical Nutrition Therapy:  Appt start time: 900 end time:  915  Assessment:  Primary concerns today: Blood sugar control.  Patient  continues with low blood sugars midday after recent decrease in both carb to insulin ratio and insulin sensitivity factor in time period 12 am to 130 PM. Her fasting blood sugars are lower. She has decreased appetite, decreased intake( may be januvia effect)  and no symptoms of low blood sugar despite many afternoon blood sugars 50s and 60s. Fasting now in range with average of 131 Meter download: average 118, range 46-223,29% below range is low, 73% in target.   INSULIN:  Current basal: Lantus 51 units/day at bedtime, current bolus average: ~ 29 units for a TDD of 79 units, new CIR: 1 unit to 6, but she is usign 1:12 form 12am to 2 PM and 1:10 from 2 Pm to 12 am. New Correction factor for time period 12 am to 2 PM is : 1:30 mg/dl   And 2 Pm to 12 am is 1: 25  Progress Towards Goal(s):  Some progress.   Nutritional Diagnosis:  [place]-2.3 Food-medication interaction As related to lack of coordination between food intake and insulin dosages is less improved due to multiple factors As evidenced by her deterioration in  blood sugars,( but seems  less frustrated with being restricted on what and when she eats to have to control her blood sugars).    Intervention:  She is doing well with accu-chek expert meter, a1C dropped significantly since she started using it. Now prandial CBG are dropping significantly since starting [name] and not eating. Goal is to keep CBGs > 70 mg/dl. Teaching Method Utilized: Visual, Auditory  Barriers to learning/adherence to lifestyle change: Mood/depression/diabetes distress  Demonstrated degree of understanding via:  Teach Back   Monitoring/Evaluation:  Dietary intake, exercise, blood sugars, and body weight in 1 week(s).to assure no readings< 70 mg/dl

## 2015-02-10 NOTE — Patient Instructions (Signed)
General Instructions: Please follow up with Dr. Dion SaucierLandau for possible carpal tunnel left hand  Please follow with Dr. Matthias HughsBuccini for epigastric pain Take care Follow with PCP 03/2015  Call Lupita LeashDonna if you are having low blood sugars within 1 week and see her within 2 weeks Try to wear your wrist splint most of the time for help the numbness and tingling    Treatment Goals:  Goals (1 Years of Data) as of 02/10/15          As of Today 01/14/15 01/08/15 01/01/15 12/30/14     Blood Pressure   . Blood Pressure < 150/90  114/66 140/70 142/66 111/70 119/63     Result Component   . HEMOGLOBIN A1C < 7.0    7.8     . LDL CALC < 100            Progress Toward Treatment Goals:  Treatment Goal 02/10/2015  Hemoglobin A1C improved  Blood pressure at goal    Self Care Goals & Plans:  Self Care Goal 02/10/2015  Manage my medications take my medicines as prescribed; bring my medications to every visit; refill my medications on time; follow the sick day instructions if I am sick  Monitor my health keep track of my blood pressure; keep track of my blood glucose; bring my glucose meter and log to each visit; bring my blood pressure log to each visit; keep track of my weight; check my feet daily  Eat healthy foods drink diet soda or water instead of juice or soda; eat more vegetables; eat foods that are low in salt; eat baked foods instead of fried foods; eat fruit for snacks and desserts; eat smaller portions  Be physically active find an activity I enjoy  Meeting treatment goals maintain the current self-care plan    Home Blood Glucose Monitoring 02/10/2015  Check my blood sugar 3 times a day  When to check my blood sugar before meals     Care Management & Community Referrals:  Referral 02/10/2015  Referrals made for care management support none needed  Referrals made to community resources none     Hypoglycemia Hypoglycemia occurs when the glucose in your blood is too low. Glucose is a type of sugar  that is your body's main energy source. Hormones, such as insulin and glucagon, control the level of glucose in the blood. Insulin lowers blood glucose and glucagon increases blood glucose. Having too much insulin in your blood stream, or not eating enough food containing sugar, can result in hypoglycemia. Hypoglycemia can happen to people with or without diabetes. It can develop quickly and can be a medical emergency.  CAUSES   Missing or delaying meals.  Not eating enough carbohydrates at meals.  Taking too much diabetes medicine.  Not timing your oral diabetes medicine or insulin doses with meals, snacks, and exercise.  Nausea and vomiting.  Certain medicines.  Severe illnesses, such as hepatitis, kidney disorders, and certain eating disorders.  Increased activity or exercise without eating something extra or adjusting medicines.  Drinking too much alcohol.  A nerve disorder that affects body functions like your heart rate, blood pressure, and digestion (autonomic neuropathy).  A condition where the stomach muscles do not function properly (gastroparesis). Therefore, medicines and food may not absorb properly.  Rarely, a tumor of the pancreas can produce too much insulin. SYMPTOMS   Hunger.  Sweating (diaphoresis).  Change in body temperature.  Shakiness.  Headache.  Anxiety.  Lightheadedness.  Irritability.  Difficulty  concentrating.  Dry mouth.  Tingling or numbness in the hands or feet.  Restless sleep or sleep disturbances.  Altered speech and coordination.  Change in mental status.  Seizures or prolonged convulsions.  Combativeness.  Drowsiness (lethargic).  Weakness.  Increased heart rate or palpitations.  Confusion.  Pale, gray skin color.  Blurred or double vision.  Fainting. DIAGNOSIS  A physical exam and medical history will be performed. Your caregiver may make a diagnosis based on your symptoms. Blood tests and other lab tests  may be performed to confirm a diagnosis. Once the diagnosis is made, your caregiver will see if your signs and symptoms go away once your blood glucose is raised.  TREATMENT  Usually, you can easily treat your hypoglycemia when you notice symptoms.  Check your blood glucose. If it is less than 70 mg/dl, take one of the following:   3-4 glucose tablets.    cup juice.    cup regular soda.   1 cup skim milk.   -1 tube of glucose gel.   5-6 hard candies.   Avoid high-fat drinks or food that may delay a rise in blood glucose levels.  Do not take more than the recommended amount of sugary foods, drinks, gel, or tablets. Doing so will cause your blood glucose to go too high.   Wait 10-15 minutes and recheck your blood glucose. If it is still less than 70 mg/dl or below your target range, repeat treatment.   Eat a snack if it is more than 1 hour until your next meal.  There may be a time when your blood glucose may go so low that you are unable to treat yourself at home when you start to notice symptoms. You may need someone to help you. You may even faint or be unable to swallow. If you cannot treat yourself, someone will need to bring you to the hospital.  HOME CARE INSTRUCTIONS  If you have diabetes, follow your diabetes management plan by:  Taking your medicines as directed.  Following your exercise plan.  Following your meal plan. Do not skip meals. Eat on time.  Testing your blood glucose regularly. Check your blood glucose before and after exercise. If you exercise longer or different than usual, be sure to check blood glucose more frequently.  Wearing your medical alert jewelry that says you have diabetes.  Identify the cause of your hypoglycemia. Then, develop ways to prevent the recurrence of hypoglycemia.  Do not take a hot bath or shower right after an insulin shot.  Always carry treatment with you. Glucose tablets are the easiest to carry.  If you are  going to drink alcohol, drink it only with meals.  Tell friends or family members ways to keep you safe during a seizure. This may include removing hard or sharp objects from the area or turning you on your side.  Maintain a healthy weight. SEEK MEDICAL CARE IF:   You are having problems keeping your blood glucose in your target range.  You are having frequent episodes of hypoglycemia.  You feel you might be having side effects from your medicines.  You are not sure why your blood glucose is dropping so low.  You notice a change in vision or a new problem with your vision. SEEK IMMEDIATE MEDICAL CARE IF:   Confusion develops.  A change in mental status occurs.  The inability to swallow develops.  Fainting occurs. Document Released: 10/11/2005 Document Revised: 10/16/2013 Document Reviewed: 02/07/2012 ExitCare Patient Information  2015 [employer]. This information is not intended to replace advice given to you by your health care provider. Make sure you discuss any questions you have with your health care provider.

## 2015-02-11 NOTE — Patient Outreach (Signed)
[facility] ([facility] Care Management  02/11/2015  [name] 03/28/1951 [id number]   Received notification from Juana Wallace, RN to close case as patient has met goals with [facility] Care Management.  Case closed at this time.   O. Moore [facility] Care Management [facility] CM Assistant Phone: [phone] Fax: [phone]  

## 2015-02-11 NOTE — Assessment & Plan Note (Addendum)
Chronic at least since 2014 referred back to GI (Dr. Matthias HughsBuccini) who has worked this up in the past significantly as well as [facility]  Pt sch 02/26/15 at 11:15 am

## 2015-02-11 NOTE — Assessment & Plan Note (Signed)
Rx refill of Imdur today

## 2015-02-11 NOTE — Assessment & Plan Note (Addendum)
BP Readings from Last 3 Encounters:  02/10/15 114/66  01/14/15 140/70  01/08/15 142/66    Lab Results  Component Value Date   NA 136 12/21/2014   K 4.6 12/21/2014   CREATININE 1.65* 12/21/2014    Assessment: Blood pressure control: controlled Progress toward BP goal:  at goal Comments: none  Plan: Medications:  continue current medicationsClonidine 0.3 patch, Lasix 40 qd, Imdur 60 XL, Cozaar 50 mg qd, Toprol 25 mg  Other plans: f/u with PCP mid June 2016

## 2015-02-11 NOTE — Assessment & Plan Note (Addendum)
S/p good response to surgery on the right now with numbness and tingling on the LEFT with decreased sensation hand digits which could be manifestation on CTS on left Encouraged use of wrist splint since pt does not want to resume Gabapentin Will have pt f/u/refer with ortho(Dr. Landau) for further rec. Of tx she may need nerve conduction studies, steroid inj and if severe surgery on the left   

## 2015-02-11 NOTE — Assessment & Plan Note (Signed)
S/p good response to surgery on the right now with numbness and tingling on the LEFT with decreased sensation hand digits which could be manifestation on CTS on left Encouraged use of wrist splint since pt does not want to resume Gabapentin Will have pt f/u/refer with ortho(Dr. Landau) for further rec. Of tx she may need nerve conduction studies, steroid inj and if severe surgery on the left   

## 2015-02-11 NOTE — Progress Notes (Signed)
Subjective:    Patient ID: [name], female    DOB: 06-Oct-1951, 64 y.o.   MRN: [id number]  HPI Comments: 64 y.o with significant PMH i.e DM 2with peripheral neuropathy, HTN (BP 114/66 today), HLD, depression, GERD, chronic pain, diastolic CHF, CAD, CKD 3, h/o carpal tunnel s/p surgery (right hand), chronic epigastric ab pain, vit d def, slow transit constipation, hot flashes  She presents for f/u  1. DM 2 with neuropathy and hypoglycemia.  -Last A1C 3/16 7.8. She is taking Januvia 50 mg qd, short acting insulin based on CHO consumption anywhere from 6-11 units with meals, and Lantus 51 units qhs (around 6-7 pm).  Her meter readings range 46-223 with 40s-50s noted with lunch and dinner.  Previous correction per Lupita Leash was 1:25 and insulin to CHO was 1:10.  We made changes today per Lupita Leash recommendations.  She states due to chronic ab pain issues at times she does not have an appetite, feels nauseated, has epigastric pain assoc. With food, and feels like the food balls up in her mouth and does not want to go down though denies dysphagia. She is not having hypoglycemic sx's with her lows which is concerning. She is compliant with checking blood glucose levels.   -will check urine micro/Alb/Cr today  -eye MD appt with Dr. Dione Booze coming soon   2. Left hand numbness and tingling in all digits and entire hand except 5th digit. Prev. dx'ed carpal tunnel on the right hand and had surgery. She has also had steroid inj. In the wrist by ortho (Dr Dion Saucier) which temporarily relieved sx's but is not helping now for the right hand. The symptoms wake her up at night in her sleep.  Sx's are worse with driving.  Being still makes her sx's better. She does not have on wrist splints today but does have them at home.  She is no longer taking Gabapentin which resumption was disc'ed and she prefers to limit medications today.  Her sx's have been worse for the last 1.5 months and states it is painful to touch the skin.  She  last saw Dr. Dion Saucier either 08/2014 or 09/2014.    3.Chronic epigastric ab pain (worse w/in the last 2 months)/ chronic slow transit constipation followed by [facility] (Dr. Matthias Hughs).  She takes Dexilant 60 mg qd.  She stopped taking Carafate b/c it was not helping.  She states due to chronic ab pain issues at times she does not have an appetite, feels nauseated (takes Zofran with relief), has epigastric pain assoc. With food, and feels like the food balls up in her mouth and does not want to go down and she does not want to swallow it though denies dysphagia.  For relief of sx's she tries to lie down.  For the constipation she is no longer taking Senna but states it helps her bloating sx's and she intermittently takes Colace.  One time she even tried Xlax suppositories but did not get relief or have stool for hours.   -of note we sch appt with Dr. Matthias Hughs 02/26/15 at 11:15 am today  4. Pt needs Rx refill Imdur     Review of Systems  Respiratory: Negative for shortness of breath.   Cardiovascular: Negative for chest pain.  Gastrointestinal: Positive for abdominal pain and constipation.  Neurological: Positive for numbness.       Objective:   Physical Exam  Constitutional: She is oriented to person, place, and time. Vital signs are normal. She appears well-developed and  well-nourished. She is cooperative. No distress.  HENT:  Head: Normocephalic and atraumatic.  Mouth/Throat: No oropharyngeal exudate.  Eyes: Conjunctivae are normal. Right eye exhibits no discharge. Left eye exhibits no discharge. No scleral icterus.  Cardiovascular: Normal rate, regular rhythm, S1 normal, S2 normal and normal heart sounds.   No murmur heard. Pulmonary/Chest: Effort normal and breath sounds normal. No respiratory distress. She has no wheezes.  Abdominal: Soft. Bowel sounds are normal. There is tenderness.    Musculoskeletal:       Left wrist: She exhibits no bony tenderness, no swelling, no effusion and no  deformity.  Neurological: She is alert and oriented to person, place, and time. She has normal strength. A sensory deficit is present. Gait normal.  Sensation decreased in left hand all 4 digits excluding 5th/pinky; motor strength intact left hand   Skin: Skin is warm and dry. No rash noted. She is not diaphoretic.  Psychiatric: She has a normal mood and affect. Her speech is normal and behavior is normal. Judgment and thought content normal. Cognition and memory are normal.  Nursing note and vitals reviewed.         Assessment & Plan:  F/u 1 week call Lupita LeashDonna with meter readings then 2 weeks see Lupita LeashDonna in clinic  F/u with PCP mid 03/2015

## 2015-02-11 NOTE — Assessment & Plan Note (Signed)
S/p good response to surgery on the right now with numbness and tingling on the LEFT with decreased sensation hand digits which could be manifestation on CTS on left Encouraged use of wrist splint since pt does not want to resume Gabapentin Will have pt f/u/refer with ortho(Dr. Dion SaucierLandau) for further rec. Of tx she may need nerve conduction studies, steroid inj and if severe surgery on the left

## 2015-02-11 NOTE — Assessment & Plan Note (Signed)
Lab Results  Component Value Date   HGBA1C 7.8 01/08/2015   HGBA1C 9.3 10/23/2014   HGBA1C 9.5 08/14/2014     Assessment: Diabetes control: good control (HgbA1C at goal) Progress toward A1C goal:  improved Comments: DM more controlled now having silent hypoglycemic episodes w/o sx's   Plan: Medications:  continue current medications with changes.  Butch Penny met with patient today and changed meter so the insulin correction is 1:30 instead of 1:25 and insulin to CHO ratio is 1:12 instead of 1:10. She inputed these changes into pts meter which tells her how much short acting insulin to given herself.  She will continue Lantus 51 units around 6-7 PM and Januvia for now.   Home glucose monitoring: Frequency: 3 times a day Timing: before meals Instruction/counseling given: other instruction/counseling: hypoglycemia sx's  Educational resources provided: Ed hypoglycemia  Self management tools provided: copy of home glucose meter download Other plans: pt to call Butch Penny in 1 week with glucose readings and see Butch Penny in 2 weeks then f/u with PCP in mid June 2016

## 2015-02-11 NOTE — Progress Notes (Signed)
INTERNAL MEDICINE TEACHING ATTENDING ADDENDUM - Geraldene Eisel, MD: I reviewed and discussed at the time of visit with the resident Dr. McLean, the patient's medical history, physical examination, diagnosis and results of pertinent tests and treatment and I agree with the patient's care as documented.  

## 2015-02-14 ENCOUNTER — Telehealth: Payer: Self-pay | Admitting: Dietician

## 2015-02-14 ENCOUNTER — Other Ambulatory Visit: Payer: Self-pay | Admitting: Internal Medicine

## 2015-02-14 NOTE — Telephone Encounter (Signed)
Patient called to update us on occurences of low blood sugar since last visit: She is having a good week with no low blood sugars and is eating a little better. She confirmed she will be here in 2  weeks for her appointment

## 2015-02-18 ENCOUNTER — Telehealth: Payer: Self-pay | Admitting: Dietician

## 2015-02-18 ENCOUNTER — Telehealth: Payer: Self-pay | Admitting: Cardiology

## 2015-02-18 DIAGNOSIS — E1165 Type 2 diabetes mellitus with hyperglycemia: Secondary | ICD-10-CM

## 2015-02-18 DIAGNOSIS — IMO0002 Reserved for concepts with insufficient information to code with codable children: Secondary | ICD-10-CM

## 2015-02-18 DIAGNOSIS — Z794 Long term (current) use of insulin: Principal | ICD-10-CM

## 2015-02-18 LAB — HM DIABETES EYE EXAM

## 2015-02-18 NOTE — Telephone Encounter (Signed)
Pt enrolling in HF program w/ insurance co. Courtesy call to inform us.

## 2015-02-18 NOTE — Telephone Encounter (Addendum)
Patient called at the urging of her health care nurse; Despite call last week, was having a few low blood sugars and on Sunday night ~ 5 PM had a blood sugar of 33, by 528 it was 53, by 643 it was 82. Left her a message to hold her meal time insulin until we talk and get other blood glucose numbers.  Note last visit Meter download: average  Was 118, range 46-223,29% below range is low, 73% in target.

## 2015-02-21 ENCOUNTER — Other Ambulatory Visit: Payer: Self-pay | Admitting: *Deleted

## 2015-02-21 MED ORDER — INSULIN GLARGINE 100 UNIT/ML SOLOSTAR PEN: 51.0000 [IU] | PEN_INJECTOR | Freq: Every day | SUBCUTANEOUS | Status: DC

## 2015-02-21 NOTE — Telephone Encounter (Signed)
Not taking lantus for past week and   blood sugars doing okay. Taking Novolog a little bit every now and then and [name].  Ran out of lantus because drug store does not have her updated dose of 51 units.  CBG 194 this am at 10:43 am, 172 yesterday at 10:57, the rest have been lower than these and not below 70.  Told her I would ask to have her lantus dose RX updated and sent to pharmacy and she reminded me she has an appointment here with CDE on Monday. She agreed to continue the same diabetes medicine until then.

## 2015-02-21 NOTE — Addendum Note (Signed)
Addended by: Denton BrickRUONG, Vanecia Limpert M on: 02/21/2015 11:42 AM   Modules accepted: Orders

## 2015-02-24 ENCOUNTER — Ambulatory Visit (INDEPENDENT_AMBULATORY_CARE_PROVIDER_SITE_OTHER): Payer: Medicare Other | Admitting: Dietician

## 2015-02-24 ENCOUNTER — Other Ambulatory Visit: Payer: Self-pay | Admitting: Dietician

## 2015-02-24 ENCOUNTER — Ambulatory Visit: Payer: Self-pay | Admitting: Internal Medicine

## 2015-02-24 ENCOUNTER — Encounter: Payer: Self-pay | Admitting: *Deleted

## 2015-02-24 VITALS — Wt 190.6 lb

## 2015-02-24 DIAGNOSIS — Z713 Dietary counseling and surveillance: Secondary | ICD-10-CM

## 2015-02-24 DIAGNOSIS — E118 Type 2 diabetes mellitus with unspecified complications: Secondary | ICD-10-CM | POA: Diagnosis not present

## 2015-02-24 DIAGNOSIS — IMO0002 Reserved for concepts with insufficient information to code with codable children: Secondary | ICD-10-CM

## 2015-02-24 DIAGNOSIS — Z794 Long term (current) use of insulin: Secondary | ICD-10-CM

## 2015-02-24 DIAGNOSIS — E1165 Type 2 diabetes mellitus with hyperglycemia: Secondary | ICD-10-CM

## 2015-02-24 MED ORDER — ACCU-CHEK FASTCLIX LANCETS MISC: Status: DC

## 2015-02-24 MED ORDER — INSULIN GLARGINE 100 UNIT/ML SOLOSTAR PEN: 51.0000 [IU] | PEN_INJECTOR | Freq: Every day | SUBCUTANEOUS | Status: DC

## 2015-02-24 NOTE — Progress Notes (Unsigned)
Patient is having low blood sugar off lantus and with ~ 25 units split in 3 doses of Novolog and 50 mg januvua a day. Attending physician advised her to hold Novolog and restart lantus since fasting blood sugars are higher off lantus and lows are in daytime.   Patient says her insurance wants her to get 90 day supplies. Pharmacy says they needs prescriptions for 90 days for lantus and will need refill on januvia soon.

## 2015-02-24 NOTE — Patient Instructions (Signed)
Stop the Novolog for now.  Restart Lantus- 51 units at bedtime.  Continue the [name].  Great job with weight loss, sorry you are having constipation and nausea! :(  PLease call us is you have any blood sugars less than 70.

## 2015-02-24 NOTE — Progress Notes (Signed)
Medical Nutrition Therapy:  Appt start time: 1030 end time:  1115  Assessment:  Primary concerns today: Blood sugar control. And constipation Patient  continues with low blood sugars mid and late ay after recent decrease in both carb to insulin ratio and insulin sensitivity factor. Her fasting blood sugars are higher again off lantus. She has decreased appetite/intake due to constipation and nausea. She does not think it is a [name] effect. No symptoms of low blood sugar unil it gets to 40s and 50 s.  Meter download: average 119, range 33-223,25% below range, 3.4% is low, 69% in target.  [facility] who recommended  linzess for her constipation. She is also providing Catelyn with a scale for her CHF that she can make payments on. Fulton Molelice says she enjoys having someone to talk to between counseling sessions.  Stomach hurts, feels as though she has tried everything for her constipation. Senna does not work, finally used magnesim citrate.  Has not restarted lantus yet,  highest amount of novolog is 13 units and is taking it 2-3 times a day  INSULIN:  Current basal: none for past 10+ days, current bolus average: ~ 25 units for a TDD of 25 units, new CIR: 1 unit to 18,  New Correction factor  : 1:70 mg/dl     Progress Towards Goal(s):  Some progress.   Nutritional Diagnosis:  [place]-2.3 Food-medication interaction As related to lack of coordination between food intake and insulin dosages is improved due to multiple factors As evidenced by her improvement  in  blood sugars with need to make further adjustment for blood sugars that are too low. .    Intervention:  She is doing well with [name], her blood sugars have dropped since she started using it. Discussed blood sugars with her PCP Dr. Danella Pentonruong and the attending physician Dr Heide SparkNarendra. . Goal is to keep CBGs > 70 mg/dl. Teaching Method Utilized: Visual, Auditory  Barriers to learning/adherence to lifestyle change:  Mood/depression/diabetes distress  Demonstrated degree of understanding via:  Teach Back   Monitoring/Evaluation:  Dietary intake, exercise, blood sugars, and body weight in 10 week(s).to assure no readings< 70 mg/dl

## 2015-02-25 ENCOUNTER — Ambulatory Visit (INDEPENDENT_AMBULATORY_CARE_PROVIDER_SITE_OTHER): Payer: 59 | Admitting: Clinical

## 2015-02-25 ENCOUNTER — Encounter (HOSPITAL_COMMUNITY): Payer: Self-pay | Admitting: Clinical

## 2015-02-25 DIAGNOSIS — F331 Major depressive disorder, recurrent, moderate: Secondary | ICD-10-CM

## 2015-02-25 NOTE — Progress Notes (Signed)
   THERAPIST PROGRESS NOTE  Session Time:  4:31 -5:30  Participation Level: Active  Behavioral Response: CasualAlertDepressed  Type of Therapy: Individual Therapy  Treatment Goals addressed: Improve Psychiatric Symptoms, Emotional Regulation Skills, Interpersonal Relationship Skills  Interventions: Motivational Interviewing, CBT, Grounding & Mindfulness Techniques  Summary: [name] is a 64 y.o. female who presents with Major Depressive Disorder.   Suicidal/Homicidal: No -without intent/plan  Therapist Response:  [name] met with clinician for an individual session. [name] shared about her psychiatric symptoms, her current life events and her homework. [name] shared that she has completed her homework half the time. [name] shared that she is feeling lonesome. When asked [name] shared that she recognizes that she would benefit from more social interactions. She shared some of the reasons that she is less engaged with her church friends. She however was able to identify other places that she could have healthy interactions. Client and clinician discussed the different kinds of friends. Client and clinician discussed the importance of interaction even if it is not to share deep thoughts and feelings. [name] stated that she would like to return to her [employer] class. She stated that she would consider doing so. [name] shared about her relationships with her husband, grand baby , and mother. She discussed some of the adjustments she has made to her relationships. Client and clinician discussed what is and what is not working. [name] agreed to continue her homework until next session.    Plan: Return again in 1-2 weeks.  Diagnosis:     Axis I: Major Depressive Disorder    Liliani Bobo A, LCSW 02/25/2015

## 2015-02-26 ENCOUNTER — Encounter: Payer: Self-pay | Admitting: Internal Medicine

## 2015-02-26 ENCOUNTER — Encounter (HOSPITAL_BASED_OUTPATIENT_CLINIC_OR_DEPARTMENT_OTHER): Payer: Self-pay | Admitting: *Deleted

## 2015-02-26 NOTE — Progress Notes (Signed)
Pt here 11/15-did well-needs [employer]

## 2015-02-27 ENCOUNTER — Telehealth: Payer: Self-pay | Admitting: *Deleted

## 2015-02-27 NOTE — Telephone Encounter (Signed)
FYI - Patient called to let us know that she is having carpel tunnel surgery tomorrow.  She will take the pain medication that the surgeon prescribes her and not take her Tylenol #4 until that medication is finished.

## 2015-02-28 ENCOUNTER — Ambulatory Visit (HOSPITAL_BASED_OUTPATIENT_CLINIC_OR_DEPARTMENT_OTHER): Payer: Medicare Other | Admitting: Anesthesiology

## 2015-02-28 ENCOUNTER — Encounter (HOSPITAL_BASED_OUTPATIENT_CLINIC_OR_DEPARTMENT_OTHER)
Admission: RE | Disposition: A | Discharge status: Home / Self Care | Payer: Self-pay | Source: Ambulatory Visit | Attending: Orthopedic Surgery

## 2015-02-28 ENCOUNTER — Encounter (HOSPITAL_BASED_OUTPATIENT_CLINIC_OR_DEPARTMENT_OTHER): Payer: Self-pay | Admitting: *Deleted

## 2015-02-28 ENCOUNTER — Ambulatory Visit (HOSPITAL_BASED_OUTPATIENT_CLINIC_OR_DEPARTMENT_OTHER)
Admission: RE | Admit: 2015-02-28 | Discharge: 2015-02-28 | Disposition: A | Discharge status: Home / Self Care | Payer: Medicare Other | Source: Ambulatory Visit | Attending: Orthopedic Surgery | Admitting: Orthopedic Surgery

## 2015-02-28 DIAGNOSIS — F329 Major depressive disorder, single episode, unspecified: Secondary | ICD-10-CM | POA: Insufficient documentation

## 2015-02-28 DIAGNOSIS — G4730 Sleep apnea, unspecified: Secondary | ICD-10-CM | POA: Insufficient documentation

## 2015-02-28 DIAGNOSIS — Z888 Allergy status to other drugs, medicaments and biological substances status: Secondary | ICD-10-CM | POA: Diagnosis not present

## 2015-02-28 DIAGNOSIS — D649 Anemia, unspecified: Secondary | ICD-10-CM | POA: Diagnosis not present

## 2015-02-28 DIAGNOSIS — Z882 Allergy status to sulfonamides status: Secondary | ICD-10-CM | POA: Diagnosis not present

## 2015-02-28 DIAGNOSIS — G5602 Carpal tunnel syndrome, left upper limb: Secondary | ICD-10-CM | POA: Diagnosis present

## 2015-02-28 DIAGNOSIS — F603 Borderline personality disorder: Secondary | ICD-10-CM | POA: Diagnosis not present

## 2015-02-28 DIAGNOSIS — G5603 Carpal tunnel syndrome, bilateral upper limbs: Secondary | ICD-10-CM | POA: Diagnosis present

## 2015-02-28 DIAGNOSIS — K219 Gastro-esophageal reflux disease without esophagitis: Secondary | ICD-10-CM | POA: Diagnosis not present

## 2015-02-28 DIAGNOSIS — Z87891 Personal history of nicotine dependence: Secondary | ICD-10-CM | POA: Diagnosis not present

## 2015-02-28 DIAGNOSIS — E119 Type 2 diabetes mellitus without complications: Secondary | ICD-10-CM | POA: Insufficient documentation

## 2015-02-28 DIAGNOSIS — Z7982 Long term (current) use of aspirin: Secondary | ICD-10-CM | POA: Insufficient documentation

## 2015-02-28 DIAGNOSIS — M109 Gout, unspecified: Secondary | ICD-10-CM | POA: Insufficient documentation

## 2015-02-28 DIAGNOSIS — I10 Essential (primary) hypertension: Secondary | ICD-10-CM | POA: Diagnosis not present

## 2015-02-28 DIAGNOSIS — E559 Vitamin D deficiency, unspecified: Secondary | ICD-10-CM | POA: Diagnosis not present

## 2015-02-28 DIAGNOSIS — Z794 Long term (current) use of insulin: Secondary | ICD-10-CM | POA: Insufficient documentation

## 2015-02-28 DIAGNOSIS — Z9049 Acquired absence of other specified parts of digestive tract: Secondary | ICD-10-CM | POA: Diagnosis not present

## 2015-02-28 DIAGNOSIS — Z90710 Acquired absence of both cervix and uterus: Secondary | ICD-10-CM | POA: Insufficient documentation

## 2015-02-28 DIAGNOSIS — I2510 Atherosclerotic heart disease of native coronary artery without angina pectoris: Secondary | ICD-10-CM | POA: Insufficient documentation

## 2015-02-28 DIAGNOSIS — Z880 Allergy status to penicillin: Secondary | ICD-10-CM | POA: Insufficient documentation

## 2015-02-28 DIAGNOSIS — E785 Hyperlipidemia, unspecified: Secondary | ICD-10-CM | POA: Diagnosis not present

## 2015-02-28 HISTORY — PX: CARPAL TUNNEL RELEASE: SHX101

## 2015-02-28 LAB — GLUCOSE, CAPILLARY: Glucose-Capillary: 115 mg/dL — ABNORMAL HIGH (ref 70–99)

## 2015-02-28 SURGERY — CARPAL TUNNEL RELEASE
Anesthesia: General | Site: Wrist | Laterality: Left

## 2015-02-28 MED ORDER — HYDROMORPHONE HCL 1 MG/ML IJ SOLN
INTRAMUSCULAR | Status: AC
  Filled 2015-02-28: qty 1

## 2015-02-28 MED ORDER — MEPERIDINE HCL 25 MG/ML IJ SOLN: 6.2500 mg | INTRAMUSCULAR | Status: DC | PRN

## 2015-02-28 MED ORDER — BUPIVACAINE HCL (PF) 0.5 % IJ SOLN
INTRAMUSCULAR | Status: DC | PRN
  Administered 2015-02-28: 7 mL

## 2015-02-28 MED ORDER — OXYCODONE-ACETAMINOPHEN 5-325 MG PO TABS: 1.0000 | ORAL_TABLET | Freq: Four times a day (QID) | ORAL | Status: DC | PRN

## 2015-02-28 MED ORDER — MIDAZOLAM HCL 2 MG/2ML IJ SOLN
INTRAMUSCULAR | Status: AC
  Filled 2015-02-28: qty 2

## 2015-02-28 MED ORDER — ONDANSETRON HCL 4 MG/2ML IJ SOLN
INTRAMUSCULAR | Status: DC | PRN
  Administered 2015-02-28: 4 mg via INTRAVENOUS

## 2015-02-28 MED ORDER — EPHEDRINE SULFATE 50 MG/ML IJ SOLN
INTRAMUSCULAR | Status: DC | PRN
  Administered 2015-02-28: 10 mg via INTRAVENOUS

## 2015-02-28 MED ORDER — HYDROMORPHONE HCL 1 MG/ML IJ SOLN
0.2500 mg | INTRAMUSCULAR | Status: DC | PRN
  Administered 2015-02-28 (×2): 0.5 mg via INTRAVENOUS

## 2015-02-28 MED ORDER — LACTATED RINGERS IV SOLN
INTRAVENOUS | Status: DC
  Administered 2015-02-28: 08:00:00 via INTRAVENOUS

## 2015-02-28 MED ORDER — OXYCODONE HCL 5 MG PO TABS
ORAL_TABLET | ORAL | Status: AC
  Filled 2015-02-28: qty 1

## 2015-02-28 MED ORDER — LIDOCAINE HCL (CARDIAC) 20 MG/ML IV SOLN
INTRAVENOUS | Status: DC | PRN
  Administered 2015-02-28: 100 mg via INTRAVENOUS

## 2015-02-28 MED ORDER — FENTANYL CITRATE (PF) 100 MCG/2ML IJ SOLN
INTRAMUSCULAR | Status: DC | PRN
  Administered 2015-02-28 (×2): 100 ug via INTRAVENOUS

## 2015-02-28 MED ORDER — ONDANSETRON HCL 4 MG PO TABS: 4.0000 mg | ORAL_TABLET | Freq: Three times a day (TID) | ORAL | Status: DC | PRN

## 2015-02-28 MED ORDER — ONDANSETRON HCL 4 MG/2ML IJ SOLN
4.0000 mg | Freq: Once | INTRAMUSCULAR | Status: DC | PRN
Start: 2015-02-28 — End: 2015-02-28

## 2015-02-28 MED ORDER — OXYCODONE HCL 5 MG PO TABS
5.0000 mg | ORAL_TABLET | Freq: Once | ORAL | Status: AC
  Administered 2015-02-28: 5 mg via ORAL

## 2015-02-28 MED ORDER — PROPOFOL 10 MG/ML IV BOLUS
INTRAVENOUS | Status: DC | PRN
  Administered 2015-02-28: 20 mg via INTRAVENOUS
  Administered 2015-02-28: 150 mg via INTRAVENOUS

## 2015-02-28 MED ORDER — MIDAZOLAM HCL 5 MG/5ML IJ SOLN
INTRAMUSCULAR | Status: DC | PRN
  Administered 2015-02-28: 2 mg via INTRAVENOUS

## 2015-02-28 MED ORDER — FENTANYL CITRATE (PF) 100 MCG/2ML IJ SOLN
INTRAMUSCULAR | Status: AC
  Filled 2015-02-28: qty 4

## 2015-02-28 SURGICAL SUPPLY — 42 items
BANDAGE ELASTIC 3 VELCRO ST LF (GAUZE/BANDAGES/DRESSINGS) ×3 IMPLANT
BLADE SURG 15 STRL LF DISP TIS (BLADE) ×1 IMPLANT
BLADE SURG 15 STRL SS (BLADE) ×3
BNDG CMPR 9X4 STRL LF SNTH (GAUZE/BANDAGES/DRESSINGS) ×1
BNDG COHESIVE 3X5 TAN STRL LF (GAUZE/BANDAGES/DRESSINGS) ×3 IMPLANT
BNDG ESMARK 4X9 LF (GAUZE/BANDAGES/DRESSINGS) ×2 IMPLANT
CORDS BIPOLAR (ELECTRODE) ×2 IMPLANT
COVER BACK TABLE 60X90IN (DRAPES) ×3 IMPLANT
CUFF TOURNIQUET SINGLE 18IN (TOURNIQUET CUFF) ×2 IMPLANT
DRAPE EXTREMITY T 121X128X90 (DRAPE) ×3 IMPLANT
DRAPE SURG 17X23 STRL (DRAPES) ×3 IMPLANT
DRAPE U 20/CS (DRAPES) ×3 IMPLANT
DURAPREP 26ML APPLICATOR (WOUND CARE) ×3 IMPLANT
GAUZE SPONGE 4X4 12PLY STRL (GAUZE/BANDAGES/DRESSINGS) ×3 IMPLANT
GAUZE XEROFORM 1X8 LF (GAUZE/BANDAGES/DRESSINGS) ×3 IMPLANT
GLOVE BIO SURGEON STRL SZ 6.5 (GLOVE) ×1 IMPLANT
GLOVE BIO SURGEON STRL SZ8 (GLOVE) ×3 IMPLANT
GLOVE BIO SURGEONS STRL SZ 6.5 (GLOVE) ×1
GLOVE BIOGEL PI IND STRL 7.0 (GLOVE) IMPLANT
GLOVE BIOGEL PI IND STRL 8 (GLOVE) ×2 IMPLANT
GLOVE BIOGEL PI INDICATOR 7.0 (GLOVE) ×4
GLOVE BIOGEL PI INDICATOR 8 (GLOVE) ×4
GLOVE ORTHO TXT STRL SZ7.5 (GLOVE) ×3 IMPLANT
GOWN STRL REUS W/ TWL LRG LVL3 (GOWN DISPOSABLE) ×1 IMPLANT
GOWN STRL REUS W/ TWL XL LVL3 (GOWN DISPOSABLE) ×2 IMPLANT
GOWN STRL REUS W/TWL LRG LVL3 (GOWN DISPOSABLE) ×3
GOWN STRL REUS W/TWL XL LVL3 (GOWN DISPOSABLE) ×6
NDL HYPO 25X1 1.5 SAFETY (NEEDLE) IMPLANT
NEEDLE HYPO 25X1 1.5 SAFETY (NEEDLE) ×3 IMPLANT
NS IRRIG 1000ML POUR BTL (IV SOLUTION) ×3 IMPLANT
PACK BASIN DAY SURGERY FS (CUSTOM PROCEDURE TRAY) ×3 IMPLANT
PAD CAST 3X4 CTTN HI CHSV (CAST SUPPLIES) ×1 IMPLANT
PADDING CAST COTTON 3X4 STRL (CAST SUPPLIES) ×3
SLING ARM FOAM STRAP LRG (SOFTGOODS) ×2 IMPLANT
SPLINT PLASTER CAST XFAST 3X15 (CAST SUPPLIES) IMPLANT
SPLINT PLASTER XTRA FASTSET 3X (CAST SUPPLIES)
SUT ETHILON 3 0 PS 1 (SUTURE) ×2 IMPLANT
SUT ETHILON 4 0 PS 2 18 (SUTURE) IMPLANT
SYR BULB 3OZ (MISCELLANEOUS) ×3 IMPLANT
SYR CONTROL 10ML LL (SYRINGE) ×2 IMPLANT
TOWEL OR 17X24 6PK STRL BLUE (TOWEL DISPOSABLE) ×3 IMPLANT
UNDERPAD 30X30 (UNDERPADS AND DIAPERS) ×3 IMPLANT

## 2015-02-28 NOTE — Discharge Instructions (Signed)
Diet: As you were doing prior to hospitalization   Shower:  May shower but keep the wounds dry, use an occlusive plastic wrap, NO SOAKING IN TUB.  If the bandage gets wet, change with a clean dry gauze.  Dressing:  Leave splint in place  Activity:  Increase activity slowly as tolerated, but follow the weight bearing instructions below.  No lifting or driving for 6 weeks.  Weight Bearing:   Keep hand and splint dry..    To prevent constipation: you may use a stool softener such as -  Colace (over the counter) 100 mg by mouth twice a day  Drink plenty of fluids (prune juice may be helpful) and high fiber foods Miralax (over the counter) for constipation as needed.    Itching:  If you experience itching with your medications, try taking only a single pain pill, or even half a pain pill at a time.  You may take up to 10 pain pills per day, and you can also use benadryl over the counter for itching or also to help with sleep.   Precautions:  If you experience chest pain or shortness of breath - call 911 immediately for transfer to the hospital emergency department!!  If you develop a fever greater that 101 F, purulent drainage from wound, increased redness or drainage from wound, or calf pain -- Call the office at [phone]                                                Follow- Up Appointment:  Please call for an appointment to be [place] in 2 [place] [phone]      Post Anesthesia Home Care Instructions  Activity: Get plenty of rest for the remainder of the day. A responsible adult should stay with you for 24 hours following the procedure.  For the next 24 hours, DO NOT: -Drive a car -Advertising copywriterperate machinery -Drink alcoholic beverages -Take any medication unless instructed by your physician -Make any legal decisions or sign important papers.  Meals: Start with liquid foods such as gelatin or soup. Progress to regular foods as tolerated. Avoid greasy, spicy, heavy foods. If  nausea and/or vomiting occur, drink only clear liquids until the nausea and/or vomiting subsides. Call your physician if vomiting continues.  Special Instructions/Symptoms: Your throat may feel dry or sore from the anesthesia or the breathing tube placed in your throat during surgery. If this causes discomfort, gargle with warm salt water. The discomfort should disappear within 24 hours.  If you had a scopolamine patch placed behind your ear for the management of post- operative nausea and/or vomiting:  1. The medication in the patch is effective for 72 hours, after which it should be removed.  Wrap patch in a tissue and discard in the trash. Wash hands thoroughly with soap and water. 2. You may remove the patch earlier than 72 hours if you experience unpleasant side effects which may include dry mouth, dizziness or visual disturbances. 3. Avoid touching the patch. Wash your hands with soap and water after contact with the patch.

## 2015-02-28 NOTE — Anesthesia Preprocedure Evaluation (Addendum)
Anesthesia Evaluation  Patient identified by MRN, date of birth, ID band Patient awake    Reviewed: Allergy & Precautions, NPO status , Patient's Chart, lab work & pertinent test results  Airway Mallampati: I  TM Distance: >3 FB Neck ROM: Full    Dental   Pulmonary former smoker,    Pulmonary exam normal       Cardiovascular hypertension, Pt. on medications + CAD Normal cardiovascular exam    Neuro/Psych    GI/Hepatic GERD-  Medicated and Controlled,  Endo/Other  diabetes, Type 2, Insulin Dependent  Renal/GU CRFRenal disease     Musculoskeletal   Abdominal   Peds  Hematology   Anesthesia Other Findings   Reproductive/Obstetrics                            Anesthesia Physical Anesthesia Plan  ASA: III  Anesthesia Plan: General   Post-op Pain Management:    Induction: Intravenous  Airway Management Planned: LMA  Additional Equipment:   Intra-op Plan:   Post-operative Plan: Extubation in OR  Informed Consent: I have reviewed the patients History and Physical, chart, labs and discussed the procedure including the risks, benefits and alternatives for the proposed anesthesia with the patient or authorized representative who has indicated his/her understanding and acceptance.     Plan Discussed with: CRNA and Surgeon  Anesthesia Plan Comments:         Anesthesia Quick Evaluation

## 2015-02-28 NOTE — Op Note (Signed)
02/28/2015  9:49 AM  PATIENT:  [name] MaesAlice [name]    PRE-OPERATIVE DIAGNOSIS:  LEFT CARPAL TUNNEL SYNDROME  POST-OPERATIVE DIAGNOSIS:  Same  PROCEDURE:  LEFT CARPAL TUNNEL RELEASE  SURGEON:  Eulas PostLANDAU,Cecil Vandyke P, MD  PHYSICIAN ASSISTANT: Janace LittenBrandon Parry, OPA-C, present and scrubbed throughout the case, critical for completion in a timely fashion, and for retraction, instrumentation, and closure.  ANESTHESIA:   General  PREOPERATIVE INDICATIONS:  [name] is a  64 y.o. female with a diagnosis of CARPAL TUNNEL SYNDROME,LEFT UPPER LIMB who failed conservative measures and elected for surgical management.    The risks benefits and alternatives were discussed with the patient preoperatively including but not limited to the risks of infection, bleeding, nerve injury, incomplete relief of symptoms, pillar pain, cardiopulmonary complications, the need for revision surgery, among others, and the patient was willing to proceed.  OPERATIVE FINDINGS: The nerve was still in good continuity, minimal atrophy, no significant injection. The transverse carpal ligament was thickened.  OPERATIVE PROCEDURE: The patient is brought to the operating room placed in the supine position. General anesthesia was administered. The upper extremity was prepped and draped in usual sterile fashion. Time out was performed. The arm was elevated and exsanguinated and the tourniquet was inflated. Incision was made in line with the radial border of the ring finger. The carpal tunnel transverse fascia was identified, cleaned, and incised sharply. The common sensory branches were visualized along with the superficial palmar arch and protected.  The median nerve was protected below. Deep retractors were placed underneath the transverse carpal ligament, protecting the nerve. I released the ligament completely, and then released the distal volar forearm fascia. The nerve was identified, and visualized, and protected throughout the case. No masses  or abnormalities were identified in ulnar bursa.  The wounds were irrigated copiously, and the wounds injected, and the skin closed with nylon followed by a volar splint and sterile gauze. She tolerated this well, with no complications.

## 2015-02-28 NOTE — Anesthesia Postprocedure Evaluation (Signed)
Anesthesia Post Note  Patient: [name]  Procedure(s) Performed: Procedure(s) (LRB): LEFT CARPAL TUNNEL RELEASE (Left)  Anesthesia type: general  Patient location: PACU  Post pain: Pain level controlled  Post assessment: Patient's Cardiovascular Status Stable  Last Vitals:  Filed Vitals:   02/28/15 1114  BP: 115/80  Pulse: 73  Temp: 36.9 C  Resp: 18    Post vital signs: Reviewed and stable  Level of consciousness: sedated  Complications: No apparent anesthesia complications

## 2015-02-28 NOTE — Transfer of Care (Signed)
Immediate Anesthesia Transfer of Care Note  Patient: [name]  Procedure(s) Performed: Procedure(s): LEFT CARPAL TUNNEL RELEASE (Left)  Patient Location: PACU  Anesthesia Type:General  Level of Consciousness: awake, alert  and oriented  Airway & Oxygen Therapy: Patient Spontanous Breathing and Patient connected to nasal cannula oxygen  Post-op Assessment: Report given to RN  Post vital signs: Reviewed and stable  Last Vitals:  Filed Vitals:   02/28/15 1004  BP:   Pulse: 78  Temp: 36.7 C  Resp: 17    Complications: No apparent anesthesia complications

## 2015-02-28 NOTE — Anesthesia Procedure Notes (Signed)
Procedure Name: LMA Insertion Date/Time: 02/28/2015 9:25 AM Performed by: Maris BergerENENNY, Nelline Lio T Pre-anesthesia Checklist: Patient identified, Emergency Drugs available, Suction available and Patient being monitored Patient Re-evaluated:Patient Re-evaluated prior to inductionOxygen Delivery Method: Circle System Utilized Preoxygenation: Pre-oxygenation with 100% oxygen Intubation Type: IV induction Ventilation: Mask ventilation without difficulty LMA: LMA inserted LMA Size: 4.0 Number of attempts: 1 Airway Equipment and Method: Bite block Placement Confirmation: positive ETCO2 Dental Injury: Teeth and Oropharynx as per pre-operative assessment

## 2015-02-28 NOTE — H&P (Signed)
PREOPERATIVE H&P  Chief Complaint: CARPAL TUNNEL SYNDROME,LEFT UPPER LIMB  HPI: [name] is a 64 y.o. female who presents for preoperative history and physical with a diagnosis of CARPAL TUNNEL SYNDROME,LEFT UPPER LIMB. Symptoms are rated as moderate to severe, and have been worsening.  This is significantly impairing activities of daily living.  She has elected for surgical management. She has failed injections and activity modification and bracing. We did a previous right carpal tunnel release which she has done well with, and wishes to have the same thing done on the left side.  Past Medical History  Diagnosis Date  . Hypertension   . Peripheral neuropathy   . Cervicalgia   . Low back pain syndrome   . Hyperlipidemia   . Vitamin D deficiency   . Vitamin B12 deficiency   . Iliotibial band syndrome   . Anemia   . Borderline personality disorder   . Nonorganic psychosis   . GERD (gastroesophageal reflux disease)   . Diabetes mellitus   . Depression   . Diabetic gastroparesis   . CAD (coronary artery disease)     Catherization 11/18/09>nonobstructive CAD , normal LV function, EF 65%  . Health maintenance examination     Colonoscopy 2009> normal; Diabetic eye exam 12/2008. No diabetic retinopathy; Mammogram 11/10> No evidence of malignancy, DXA 03/14/13 : normal  . Headache(784.0)   . Arthritis   . SBO (small bowel obstruction) 01/09/2013  . Diastolic CHF     Grade I, on Echo 06/2013, EF 55-60%  . Sleep apnea     does not wear CPAP   . Diabetes mellitus, type II   . Wears glasses   . Wears dentures     upper  . Gout   . Right carpal tunnel syndrome 09/13/2014   Past Surgical History  Procedure Laterality Date  . Abdominal hysterectomy    . Hand surgery  1992    rt  . Bowel resection N/A 01/10/2013    Procedure: SMALL BOWEL RESECTION;  Surgeon: Lodema PilotBrian Layton, DO;  Location: [facility] OR;  Service: General;  Laterality: N/A;  . Lysis of adhesion N/A 01/10/2013    Procedure: LYSIS  OF ADHESION;  Surgeon: Lodema PilotBrian Layton, DO;  Location: [facility] OR;  Service: General;  Laterality: N/A;  . Laparotomy N/A 01/10/2013    Procedure: EXPLORATORY LAPAROTOMY;  Surgeon: Lodema PilotBrian Layton, DO;  Location: [facility] OR;  Service: General;  Laterality: N/A;  . Incisional hernia repair N/A 03/01/2014    Procedure: LAPAROSCOPIC INCISIONAL HERNIA;  Surgeon: Axel FillerArmando Ramirez, MD;  Location: [facility];  Service: General;  Laterality: N/A;  . Insertion of mesh N/A 03/01/2014    Procedure: INSERTION OF MESH;  Surgeon: Axel FillerArmando Ramirez, MD;  Location: [facility];  Service: General;  Laterality: N/A;  . Hernia repair  03/01/14    Lap incisional hernia repair w/mesh  . Appendectomy    . Carpal tunnel release Right 09/13/2014    Procedure: RIGHT WRIST CARPAL TUNNEL RELEASE;  Surgeon: Eulas PostJoshua P Noami Bove, MD;  Location: [facility];  Service: Orthopedics;  Laterality: Right;  . Steriod injection Left 09/13/2014    Procedure: STEROID INJECTION LEFT HAND;  Surgeon: Eulas PostJoshua P Haylea Schlichting, MD;  Location: [facility];  Service: Orthopedics;  Laterality: Left;   History   Social History  . Marital Status: Married    Spouse Name: N/A  . Number of Children: N/A  . Years of Education: N/A   Social History Main Topics  . Smoking status: Former Smoker  Types: Cigarettes    Quit date: 10/25/2002  . Smokeless tobacco: Never Used  . Alcohol Use: No  . Drug Use: No  . Sexual Activity: Not Currently   Other Topics Concern  . None   Social History Narrative   Family History  Problem Relation Age of Onset  . Diabetes Mother   . Hypertension Mother   . Hyperlipidemia Mother   . Arthritis Mother   . Depression Mother   . Heart disease Father   . Diabetes Brother   . Heart disease Brother   . Heart disease Paternal Grandmother   . Diabetes Brother    Allergies  Allergen Reactions  . Cimetidine Other (See Comments)    Breast swelling  . Indomethacin Diarrhea    Caused severe diarrhea with episodes of  incontinance  . Penicillins Hives and Itching    Other reaction(s): Delusions (intolerance)  . Indomethacin Other (See Comments)    Severe diarrhea  . Sulfonamide Derivatives     unknown   Prior to Admission medications   Medication Sig Start Date End Date Taking? Authorizing Provider  ACCU-CHEK FASTCLIX LANCETS MISC TEST five times a day 02/24/15   Denton Brickiana M Truong, MD  acetaminophen-codeine (TYLENOL #4) 300-60 MG per tablet Take 2 tablets by mouth 2 (two) times daily as needed for moderate pain. 12/30/14   Erick ColaceAndrew E Kirsteins, MD  albuterol (PROVENTIL HFA;VENTOLIN HFA) 108 (90 BASE) MCG/ACT inhaler Inhale 2 puffs into the lungs every 6 (six) hours as needed for wheezing or shortness of breath. 11/19/14   Otis BraceMarjan Rabbani, MD  aspirin 81 MG chewable tablet Chew 81 mg by mouth daily.    Historical Provider, MD  buPROPion (WELLBUTRIN XL) 150 MG 24 hr tablet Take 3 tablets (450 mg total) by mouth daily. 01/01/15   Archer AsaGerald Plovsky, MD  clonazePAM (KLONOPIN) 0.5 MG tablet Take 1 tablet (0.5 mg total) by mouth 3 (three) times daily. For anxiety 01/01/15   Archer AsaGerald Plovsky, MD  cloNIDine (CATAPRES - DOSED IN MG/24 HR) 0.3 mg/24hr patch Place 1 patch (0.3 mg total) onto the skin every 7 (seven) days. 09/25/14   Denton Brickiana M Truong, MD  dexlansoprazole (DEXILANT) 60 MG capsule Take 1 capsule (60 mg total) by mouth daily. For reflux 11/13/14   Shuvon B Rankin, NP  Docusate Sodium (COLACE PO) Take 2 tablets by mouth daily as needed (for constipation).     Historical Provider, MD  furosemide (LASIX) 40 MG tablet take 1 tablet by mouth every morning 02/14/15   Denton Brickiana M Truong, MD  glucose blood (ACCU-CHEK AVIVA PLUS) test strip Check blood sugar up to 4 times a day as instructed 09/03/14   Earl LagosNischal Narendra, MD  Insulin Glargine (LANTUS SOLOSTAR) 100 UNIT/ML Solostar Pen Inject 51 Units into the skin at bedtime. Patient not taking: Reported on 02/24/2015 02/24/15   Denton Brickiana M Truong, MD  Insulin Pen Needle (BD PEN NEEDLE NANO U/F) 32G X 4  MM MISC USE TO TEST UP TO 5 TIMES A DAY ACCORDING TO [facility] 09/03/14   Nischal Narendra, MD  isosorbide mononitrate (IMDUR) 60 MG 24 hr tablet Take 1 tablet (60 mg total) by mouth every morning. 02/10/15   Annett Gularacy N McLean, MD  losartan (COZAAR) 100 MG tablet Take 0.5 tablets (50 mg total) by mouth every morning. 01/08/15   Denton Brickiana M Truong, MD  metoprolol succinate (TOPROL-XL) 25 MG 24 hr tablet Take 1 tablet (25 mg total) by mouth every morning. 06/18/14   Denton Brickiana M Truong, MD  ondansetron [facility](ZOFRAN)  4 MG tablet Take 4 mg by mouth every 8 (eight) hours as needed for nausea.  04/11/14   Historical Provider, MD  rosuvastatin (CRESTOR) 10 MG tablet Take 1 tablet (10 mg total) by mouth at bedtime. 03/08/14 03/08/15  Belia Heman, MD  senna (SENOKOT) 8.6 MG TABS Take 4 tablets by mouth daily as needed (for constipation).     Historical Provider, MD  sertraline (ZOLOFT) 50 MG tablet Take 1 tablet (50 mg total) by mouth daily. 01/01/15   Archer Asa, MD  sitaGLIPtin (JANUVIA) 50 MG tablet Take 1 tablet (50 mg total) by mouth daily. 01/08/15   Denton Brick, MD  sucralfate (CARAFATE) 1 G tablet Take 1 tablet (1 g total) by mouth 4 (four) times daily -  with meals and at bedtime. Patient not taking: Reported on 01/14/2015 12/21/14   Arthor Captain, PA-C  traZODone (DESYREL) 50 MG tablet Take 1 tablet (50 mg total) by mouth at bedtime. 01/01/15   Archer Asa, MD     Positive ROS: All other systems have been reviewed and were otherwise negative with the exception of those mentioned in the HPI and as above.  Physical Exam: General: Alert, no acute distress Cardiovascular: No pedal edema Respiratory: No cyanosis, no use of accessory musculature GI: No organomegaly, abdomen is soft and non-tender Skin: No lesions in the area of chief complaint Neurologic: Sensation intact distally Psychiatric: Patient is competent for consent with normal mood and affect Lymphatic: No axillary or cervical  lymphadenopathy  MUSCULOSKELETAL: Left hand has subjective numbness in the index long and thumb fingers. Positive Phalen's test. No significant thenar atrophy. She can make a full fist.  Assessment: CARPAL TUNNEL SYNDROME,LEFT WRIST  Plan: Plan for Procedure(s): LEFT CARPAL TUNNEL RELEASE  The risks benefits and alternatives were discussed with the patient including but not limited to the risks of nonoperative treatment, versus surgical intervention including infection, bleeding, nerve injury,  blood clots, cardiopulmonary complications, morbidity, mortality, among others, and they were willing to proceed. We also discussed the risks for incomplete relief of symptoms, persistent neuropathy, prolonged healing, among others. She is willing to proceed.   Eulas Post, MD Cell [phone]   02/28/2015 7:24 AM

## 2015-03-03 ENCOUNTER — Telehealth: Payer: Self-pay | Admitting: Internal Medicine

## 2015-03-03 LAB — POCT I-STAT, CHEM 8
BUN: 38 mg/dL — ABNORMAL HIGH (ref 6–20)
CALCIUM ION: 1.27 mmol/L (ref 1.13–1.30)
CREATININE: 2.3 mg/dL — AB (ref 0.44–1.00)
Chloride: 105 mmol/L (ref 101–111)
Glucose, Bld: 103 mg/dL — ABNORMAL HIGH (ref 70–99)
HCT: 41 % (ref 36.0–46.0)
Hemoglobin: 13.9 g/dL (ref 12.0–15.0)
Potassium: 4.9 mmol/L (ref 3.5–5.1)
Sodium: 145 mmol/L (ref 135–145)
TCO2: 25 mmol/L (ref 0–100)

## 2015-03-03 NOTE — Telephone Encounter (Signed)
Called patient and discussed results of BMP done before carpal tunnel surgery. Patient was noted to have increased creatinine from baseline and she has an appointment on Wednesday for repeat BMP and follow up. She expresses understanding. Questions were answered

## 2015-03-04 ENCOUNTER — Encounter (HOSPITAL_BASED_OUTPATIENT_CLINIC_OR_DEPARTMENT_OTHER): Payer: Self-pay | Admitting: Orthopedic Surgery

## 2015-03-05 ENCOUNTER — Ambulatory Visit (INDEPENDENT_AMBULATORY_CARE_PROVIDER_SITE_OTHER): Payer: Medicare Other | Admitting: Internal Medicine

## 2015-03-05 ENCOUNTER — Encounter: Payer: Self-pay | Admitting: Internal Medicine

## 2015-03-05 VITALS — BP 110/56 | HR 60 | Temp 97.9°F | Ht 65.0 in | Wt 190.0 lb

## 2015-03-05 DIAGNOSIS — Z9889 Other specified postprocedural states: Secondary | ICD-10-CM

## 2015-03-05 DIAGNOSIS — G5601 Carpal tunnel syndrome, right upper limb: Secondary | ICD-10-CM

## 2015-03-05 DIAGNOSIS — E1122 Type 2 diabetes mellitus with diabetic chronic kidney disease: Secondary | ICD-10-CM | POA: Diagnosis not present

## 2015-03-05 DIAGNOSIS — I10 Essential (primary) hypertension: Secondary | ICD-10-CM

## 2015-03-05 DIAGNOSIS — N183 Chronic kidney disease, stage 3 (moderate): Secondary | ICD-10-CM

## 2015-03-05 DIAGNOSIS — E11649 Type 2 diabetes mellitus with hypoglycemia without coma: Secondary | ICD-10-CM

## 2015-03-05 DIAGNOSIS — I130 Hypertensive heart and chronic kidney disease with heart failure and stage 1 through stage 4 chronic kidney disease, or unspecified chronic kidney disease: Secondary | ICD-10-CM | POA: Diagnosis not present

## 2015-03-05 DIAGNOSIS — IMO0002 Reserved for concepts with insufficient information to code with codable children: Secondary | ICD-10-CM

## 2015-03-05 DIAGNOSIS — G5603 Carpal tunnel syndrome, bilateral upper limbs: Secondary | ICD-10-CM

## 2015-03-05 DIAGNOSIS — R1013 Epigastric pain: Secondary | ICD-10-CM

## 2015-03-05 DIAGNOSIS — I5030 Unspecified diastolic (congestive) heart failure: Secondary | ICD-10-CM

## 2015-03-05 DIAGNOSIS — Z794 Long term (current) use of insulin: Secondary | ICD-10-CM

## 2015-03-05 DIAGNOSIS — E1165 Type 2 diabetes mellitus with hyperglycemia: Secondary | ICD-10-CM

## 2015-03-05 DIAGNOSIS — G5602 Carpal tunnel syndrome, left upper limb: Secondary | ICD-10-CM

## 2015-03-05 DIAGNOSIS — I5032 Chronic diastolic (congestive) heart failure: Secondary | ICD-10-CM

## 2015-03-05 DIAGNOSIS — N179 Acute kidney failure, unspecified: Secondary | ICD-10-CM

## 2015-03-05 LAB — BASIC METABOLIC PANEL WITH GFR
BUN: 33 mg/dL — AB (ref 6–23)
CALCIUM: 9.7 mg/dL (ref 8.4–10.5)
CHLORIDE: 101 meq/L (ref 96–112)
CO2: 25 mEq/L (ref 19–32)
Creat: 2.45 mg/dL — ABNORMAL HIGH (ref 0.50–1.10)
GFR, Est African American: 23 mL/min — ABNORMAL LOW
GFR, Est Non African American: 20 mL/min — ABNORMAL LOW
Glucose, Bld: 102 mg/dL — ABNORMAL HIGH (ref 70–99)
Potassium: 4.4 mEq/L (ref 3.5–5.3)
Sodium: 136 mEq/L (ref 135–145)

## 2015-03-05 NOTE — Assessment & Plan Note (Signed)
BP Readings from Last 3 Encounters:  03/05/15 110/56  02/28/15 115/80  02/10/15 114/66   Lab Results  Component Value Date   NA 136 03/05/2015   K 4.4 03/05/2015   CREATININE 2.45* 03/05/2015   Assessment: Blood pressure control: controlled Progress toward BP goal:  at goal  Plan: Medications:  continue current medications toprol xl 25mg , imdur 60mg  daily, clonidine 0.3mg  patch applied today  Educational resources provided: brochure, handout, video Self management tools provided:   Other plans: HOLDING Lasix 40mg  daily dose for now given rise in Cr. Repeat bmet today showed Cr up to 2.45. Will also hold ACEi for now until repeat on labs on Friday--will need to discuss with patient on phone

## 2015-03-05 NOTE — Patient Instructions (Signed)
General Instructions:  Dear [name],  Thank you for coming in today. I hope you start feeling better soon.   Please hold your lasix starting today and follow up on Monday for repeat lab work to see if your kidney function improves  I will call you with results of your lab work from today as well  If you start noticing swelling or shortness of breath or chest pain, please call and let us know right away [phone] since we are holding your lasix  Please try to keep up with your water and food intake  Follow up with GI for your abdominal pain and constipation  Thank you for bringing your medicines today. This helps us keep you safe from mistakes.   Progress Toward Treatment Goals:  Treatment Goal 03/05/2015  Hemoglobin A1C -  Blood pressure at goal    Self Care Goals & Plans:  Self Care Goal 03/05/2015  Manage my medications take my medicines as prescribed; bring my medications to every visit; refill my medications on time; follow the sick day instructions if I am sick  Monitor my health keep track of my blood glucose; bring my glucose meter and log to each visit; keep track of my weight; check my feet daily  Eat healthy foods eat more vegetables; eat fruit for snacks and desserts; eat baked foods instead of fried foods; eat smaller portions; drink diet soda or water instead of juice or soda  Be physically active find an activity I enjoy  Meeting treatment goals -    Home Blood Glucose Monitoring 03/05/2015  Check my blood sugar -  When to check my blood sugar before meals     Care Management & Community Referrals:  Referral 02/10/2015  Referrals made for care management support none needed  Referrals made to community resources none

## 2015-03-05 NOTE — Assessment & Plan Note (Signed)
Given Acute on chronic renal failure and volume status may be somewhat dry given decreased po intake and continued diuresis. We will hold lasix 40mg  for now and also ARB. Caution for signs and symptoms of volume overload.

## 2015-03-05 NOTE — Assessment & Plan Note (Addendum)
Lab Results  Component Value Date   HGBA1C 7.8 01/08/2015   HGBA1C 9.3 10/23/2014   HGBA1C 9.5 08/14/2014    Assessment: Diabetes control: fair control Progress toward A1C goal:    was having multiple hypoglycemic episodes in May but since novolog was stopped since CDE visit in early this month, low blood sugars have stopped so far.  Comments: review of meter today showed average blood glucose of 107 with no more lows in May and cbg's ranging from 89-206.   Plan: Medications:  continue current medications januvia 50mg  qd and lantus 51 units daily Home glucose monitoring: Frequency:   Timing: before meals Instruction/counseling given: other instruction/counseling: thanked her for bringing her meter and medications to clinic today Educational resources provided: brochure, handout Self management tools provided: copy of home glucose meter download Other plans: recheck a1c June and continue to monitor for hypoglycemia. Urine microalbumin pending

## 2015-03-05 NOTE — Assessment & Plan Note (Signed)
S/p left carpal tunnel release 02/28/15 by Dr. Dion SaucierLandau  Follow up with Dr. Dion SaucierLandau Taking roxicet for pain

## 2015-03-05 NOTE — Progress Notes (Addendum)
Subjective:   Patient ID: [name] female   DOB: 08-Sep-1951 64 y.o.   MRN: [id number]  HPI: [name] is a 64 y.o. female with PMH as listed below who presents to opc today for acute visit s/p recent carpal tunnel surgery on 5/6 and asked to come to pcp office for elevated Cr noted on bmet day of surgery 5/6. Cr up to 2.3 from 1.6 2 months ago and baseline is ~1.6-1.7. She reports chronic abdominal pain and constipation and as such admits to decreased po intake and water intake that could be cause of rise in Cr in addition to her being on lasix which she is compliant with. She is also on an ACEi. Of note, she did received IV contrast on 12/21/14 for CT A/P and her Cr that day was 1.65  S/p L carpal tunnel release surgery 5/6 by Dr. Dion Saucier. She had a prior R carpal tunnel release in the past. This time she says she is experiencing more "achy" pain.   Hypoglycemia--novolog recently discontinued after CDE visit due to multiple hypoglycemic episodes in April. Saw CDE 5/2. So far this month, no more hypoglycemic episodes on current medications.   Past Medical History  Diagnosis Date  . Hypertension   . Peripheral neuropathy   . Cervicalgia   . Low back pain syndrome   . Hyperlipidemia   . Vitamin D deficiency   . Vitamin B12 deficiency   . Iliotibial band syndrome   . Anemia   . Borderline personality disorder   . Nonorganic psychosis   . GERD (gastroesophageal reflux disease)   . Diabetes mellitus   . Depression   . Diabetic gastroparesis   . CAD (coronary artery disease)     Catherization 11/18/09>nonobstructive CAD , normal LV function, EF 65%  . Health maintenance examination     Colonoscopy 2009> normal; Diabetic eye exam 12/2008. No diabetic retinopathy; Mammogram 11/10> No evidence of malignancy, DXA 03/14/13 : normal  . Headache(784.0)   . Arthritis   . SBO (small bowel obstruction) 01/09/2013  . Diastolic CHF     Grade I, on Echo 06/2013, EF 55-60%  . Sleep apnea    does not wear CPAP   . Diabetes mellitus, type II   . Wears glasses   . Wears dentures     upper  . Gout   . Right carpal tunnel syndrome 09/13/2014   Current Outpatient Prescriptions  Medication Sig Dispense Refill  . ACCU-CHEK FASTCLIX LANCETS MISC TEST five times a day 102 each 11  . albuterol (PROVENTIL HFA;VENTOLIN HFA) 108 (90 BASE) MCG/ACT inhaler Inhale 2 puffs into the lungs every 6 (six) hours as needed for wheezing or shortness of breath. 1 Inhaler 2  . aspirin 81 MG chewable tablet Chew 81 mg by mouth daily.    Marland Kitchen buPROPion (WELLBUTRIN XL) 150 MG 24 hr tablet Take 3 tablets (450 mg total) by mouth daily. 90 tablet 3  . clonazePAM (KLONOPIN) 0.5 MG tablet Take 1 tablet (0.5 mg total) by mouth 3 (three) times daily. For anxiety 270 tablet 0  . cloNIDine (CATAPRES - DOSED IN MG/24 HR) 0.3 mg/24hr patch Place 1 patch (0.3 mg total) onto the skin every 7 (seven) days. 4 patch 11  . dexlansoprazole (DEXILANT) 60 MG capsule Take 1 capsule (60 mg total) by mouth daily. For reflux 30 capsule   . Docusate Sodium (COLACE PO) Take 2 tablets by mouth daily as needed (for constipation).     Marland Kitchen glucose blood (ACCU-CHEK  AVIVA PLUS) test strip Check blood sugar up to 4 times a day as instructed 125 each 5  . Insulin Glargine (LANTUS SOLOSTAR) 100 UNIT/ML Solostar Pen Inject 51 Units into the skin at bedtime. 45 mL 4  . Insulin Pen Needle (BD PEN NEEDLE NANO U/F) 32G X 4 MM MISC USE TO TEST UP TO 5 TIMES A DAY ACCORDING TO CBG 100 each 4  . isosorbide mononitrate (IMDUR) 60 MG 24 hr tablet Take 1 tablet (60 mg total) by mouth every morning. 90 tablet 1  . Linaclotide (LINZESS) 145 MCG CAPS capsule Take 145 mcg by mouth daily.    Marland Kitchen. losartan (COZAAR) 100 MG tablet Take 0.5 tablets (50 mg total) by mouth every morning. 90 tablet 4  . metoprolol succinate (TOPROL-XL) 25 MG 24 hr tablet Take 1 tablet (25 mg total) by mouth every morning. 30 tablet 6  . ondansetron (ZOFRAN) 4 MG tablet Take 4 mg by mouth  every 8 (eight) hours as needed for nausea.     Marland Kitchen. oxyCODONE-acetaminophen (ROXICET) 5-325 MG per tablet Take 1-2 tablets by mouth every 6 (six) hours as needed for severe pain. 50 tablet 0  . rosuvastatin (CRESTOR) 10 MG tablet Take 1 tablet (10 mg total) by mouth at bedtime. 30 tablet 11  . sertraline (ZOLOFT) 50 MG tablet Take 1 tablet (50 mg total) by mouth daily. 90 tablet 0  . sitaGLIPtin (JANUVIA) 50 MG tablet Take 1 tablet (50 mg total) by mouth daily. 90 tablet 0  . traZODone (DESYREL) 50 MG tablet Take 1 tablet (50 mg total) by mouth at bedtime. 90 tablet 0  . acetaminophen-codeine (TYLENOL #4) 300-60 MG per tablet   0  . senna (SENOKOT) 8.6 MG TABS Take 4 tablets by mouth daily as needed (for constipation).     . sucralfate (CARAFATE) 1 G tablet Take 1 tablet (1 g total) by mouth 4 (four) times daily -  with meals and at bedtime. (Patient not taking: Reported on 01/14/2015) 90 tablet 0   No current facility-administered medications for this visit.   Family History  Problem Relation Age of Onset  . Diabetes Mother   . Hypertension Mother   . Hyperlipidemia Mother   . Arthritis Mother   . Depression Mother   . Heart disease Father   . Diabetes Brother   . Heart disease Brother   . Heart disease Paternal Grandmother   . Diabetes Brother    History   Social History  . Marital Status: Married    Spouse Name: N/A  . Number of Children: N/A  . Years of Education: N/A   Social History Main Topics  . Smoking status: Former Smoker    Types: Cigarettes    Quit date: 10/25/2002  . Smokeless tobacco: Never Used  . Alcohol Use: No  . Drug Use: No  . Sexual Activity: Not Currently   Other Topics Concern  . None   Social History Narrative   Review of Systems:  Constitutional:  Denies fever, chills. Decreased po intake.   Respiratory:  Denies SOB  Cardiovascular:  Denies chest pain  Gastrointestinal:  Chronic abdominal pain and constipation, occasional nausea.     Genitourinary:  Denies dysuria but reports she may have decreased urination lately   Musculoskeletal:  S/p left carpal tunnel surgery  Neurological:  Occasional headaches   Objective:  Physical Exam: Filed Vitals:   03/05/15 1126  BP: 110/56  Pulse: 60  Temp: 97.9 F (36.6 C)  TempSrc: Oral  Height: 5\' 5"  (1.651 m)  Weight: 190 lb (86.183 kg)  SpO2: 98%   Vitals reviewed. General: sitting in chair, NAD HEENT: [place]/AT Cardiac: RRR Pulm: clear to auscultation bilaterally Abd: soft, diffuse tenderness to palpation, BS present Ext: moving all extremities, -pitting edema, -tenderness to palpation of lower extremities. Left arm in brace and bandage Neuro: alert and oriented X3, good grip strength of right hand  Assessment & Plan:  Discussed with Dr. Cyndie ChimeGranfortuna Hold lasix bmet Repeat labs Friday

## 2015-03-05 NOTE — Progress Notes (Signed)
Medicine attending: Medical history, presenting problems, physical findings, and medications, reviewed with Dr Samaya Qureshi and I concur with her evaluation and management plan. 

## 2015-03-05 NOTE — Assessment & Plan Note (Addendum)
Baseline Cr estimated to be 1.6-1.7 although has been variable over the years. Cr from day or surgery 5/6 was noted to be up to 2.3 from 1.65 2 months prior. Home medications include lasix and losartan and she endorses decreased po intake of solid food and water given chronic abdominal pain and constipation; thus pre-renal etiology could be possible with additional diuresis ongoing from lasix. Additionally, contrast nephropathy would also be possible give that she received contrast for CT A/P on 2/27 when her Cr was 1.65. She denies dysuria but reports urinating less than before but probably because she is not drinking much she thinks.   Repeat BMET was done today that showed further rise in Cr to  2.45 with BUN of 33. Therefore, we initially decided to just hold lasix for now, however given further rise in Cr, will advise her to hold ARB for now as well and recheck labs on Friday or Monday whenever she can return to clinic for follow up on those days. If Cr continues to rise on follow up would recommend further work up and start with renal ultrasound. Fe Urea work up also pending from today.  -I tried calling the patient this evening to review labs from today, however, no answer.  I left a voicemail for her to call back clinic and she will need to be instructed to hold ARB at that time.  -she was advised to try to drink water and keep her po intake  Addendum 03/06/15: I called [name] this morning and was able to talk to her. I reviewed her lab work with her and explained the worsening renal function. She has been holding her lasix and we discussed also holding her losartan until her clinic follow up. She did take her dose of losartan today but will hold going forward. She does not know if she can get to clinic tomorrow but will try to get here on Monday if she can arrange transportation for follow up. She voiced understanding of results and plan for medications and will call the clinic for follow up  appointment once she talks to her husband and arranges transportation.  -repeat BMET needed on follow up

## 2015-03-05 NOTE — Assessment & Plan Note (Addendum)
Continues to have chronic abdominal pain and chronic constipation (last BM Saturday per patient). She is also on opiates right now s/p surgery.   We did not address her abdominal pain in detail today as she was here for an acute visit for worsening renal function and she states she is followed closely by GI who recently started her on Linzess and she will follow up with Dr. Matthias HughsBuccini. Of note, Januvia can cause abdominal pain, however this pain is chronic based on history and likely before [name] was started   Follow up with PCP and GI

## 2015-03-06 LAB — UREA NITROGEN, URINE: Urea Nitrogen, Ur: 473 mg/dL

## 2015-03-06 LAB — MICROALBUMIN / CREATININE URINE RATIO
Creatinine, Urine: 148.1 mg/dL
Microalb Creat Ratio: 12.8 mg/g (ref 0.0–30.0)
Microalb, Ur: 1.9 mg/dL (ref <2.0)

## 2015-03-07 NOTE — Addendum Note (Signed)
Addended by: Baltazar ApoQURESHI, Jennie Bolar J on: 03/07/2015 07:36 PM   Modules accepted: Orders

## 2015-03-07 NOTE — Addendum Note (Signed)
Addended by: Bufford SpikesFULCHER, Timeka Goette N on: 03/07/2015 09:06 AM   Modules accepted: Orders

## 2015-03-10 ENCOUNTER — Other Ambulatory Visit (INDEPENDENT_AMBULATORY_CARE_PROVIDER_SITE_OTHER): Payer: Medicare Other

## 2015-03-10 DIAGNOSIS — N183 Chronic kidney disease, stage 3 unspecified: Secondary | ICD-10-CM

## 2015-03-10 DIAGNOSIS — N179 Acute kidney failure, unspecified: Secondary | ICD-10-CM | POA: Diagnosis not present

## 2015-03-10 LAB — BASIC METABOLIC PANEL WITH GFR
BUN: 20 mg/dL (ref 6–23)
CALCIUM: 9.2 mg/dL (ref 8.4–10.5)
CO2: 25 mEq/L (ref 19–32)
Chloride: 107 mEq/L (ref 96–112)
Creat: 1.49 mg/dL — ABNORMAL HIGH (ref 0.50–1.10)
GFR, EST AFRICAN AMERICAN: 42 mL/min — AB
GFR, Est Non African American: 37 mL/min — ABNORMAL LOW
GLUCOSE: 82 mg/dL (ref 70–99)
Potassium: 4.5 mEq/L (ref 3.5–5.3)
Sodium: 141 mEq/L (ref 135–145)

## 2015-03-11 ENCOUNTER — Telehealth: Payer: Self-pay | Admitting: Internal Medicine

## 2015-03-11 NOTE — Telephone Encounter (Signed)
I called [name] this evening to review her recent lab results. Her renal function has improved down to 1.49 and she has been holding her ARB and lasix. Baseline Cr is variable but ranges between ~1.4-1.7 since 2014. She reports feeling well and denies any headaches. She weighs herself daily and does not feel volume overloaded. Weight today was 184.6lb's and yesterday was also 184lbs and weight in clinic was 190lbs on 5/11. I did ask the front desk to schedule an office visit for her as this was not done yet and she says she is scheduled for next week. We discussed continuing to hold the lasix until clinic follow up and she will also hold her ARB until then unless she is able to get her BP checked in the meantime and if her values are elevated, she can let her PCP know and consider restarting her ARB. I will forward this note to PCP for her review as well incase she wishes to adjust medications differently and can touch base with the patient.

## 2015-03-14 ENCOUNTER — Other Ambulatory Visit: Payer: Self-pay | Admitting: *Deleted

## 2015-03-14 DIAGNOSIS — E785 Hyperlipidemia, unspecified: Secondary | ICD-10-CM

## 2015-03-14 DIAGNOSIS — F331 Major depressive disorder, recurrent, moderate: Secondary | ICD-10-CM

## 2015-03-17 MED ORDER — ROSUVASTATIN CALCIUM 10 MG PO TABS: 10.0000 mg | ORAL_TABLET | Freq: Every day | ORAL | Status: DC

## 2015-03-19 ENCOUNTER — Telehealth: Payer: Self-pay | Admitting: Internal Medicine

## 2015-03-19 NOTE — Telephone Encounter (Signed)
Call to patient to confirm appointment for 03/20/15 at 8:15 lmtcb

## 2015-03-20 ENCOUNTER — Encounter: Payer: Self-pay | Admitting: Internal Medicine

## 2015-03-20 ENCOUNTER — Ambulatory Visit (INDEPENDENT_AMBULATORY_CARE_PROVIDER_SITE_OTHER): Payer: Medicare Other | Admitting: Internal Medicine

## 2015-03-20 VITALS — BP 149/73 | HR 63 | Temp 98.4°F | Ht 65.0 in | Wt 188.8 lb

## 2015-03-20 DIAGNOSIS — N179 Acute kidney failure, unspecified: Secondary | ICD-10-CM | POA: Diagnosis not present

## 2015-03-20 DIAGNOSIS — I129 Hypertensive chronic kidney disease with stage 1 through stage 4 chronic kidney disease, or unspecified chronic kidney disease: Secondary | ICD-10-CM

## 2015-03-20 DIAGNOSIS — N183 Chronic kidney disease, stage 3 unspecified: Secondary | ICD-10-CM

## 2015-03-20 DIAGNOSIS — E1165 Type 2 diabetes mellitus with hyperglycemia: Secondary | ICD-10-CM | POA: Diagnosis present

## 2015-03-20 DIAGNOSIS — I10 Essential (primary) hypertension: Secondary | ICD-10-CM

## 2015-03-20 DIAGNOSIS — E1122 Type 2 diabetes mellitus with diabetic chronic kidney disease: Secondary | ICD-10-CM

## 2015-03-20 DIAGNOSIS — Z794 Long term (current) use of insulin: Secondary | ICD-10-CM

## 2015-03-20 DIAGNOSIS — IMO0002 Reserved for concepts with insufficient information to code with codable children: Secondary | ICD-10-CM

## 2015-03-20 LAB — GLUCOSE, CAPILLARY: Glucose-Capillary: 86 mg/dL (ref 65–99)

## 2015-03-20 MED ORDER — METOPROLOL SUCCINATE ER 25 MG PO TB24: 25.0000 mg | ORAL_TABLET | Freq: Every morning | ORAL | Status: DC

## 2015-03-20 MED ORDER — SITAGLIPTIN PHOSPHATE 50 MG PO TABS: 50.0000 mg | ORAL_TABLET | Freq: Every day | ORAL | Status: DC

## 2015-03-20 MED ORDER — LOSARTAN POTASSIUM 50 MG PO TABS: 50.0000 mg | ORAL_TABLET | Freq: Every day | ORAL | Status: DC

## 2015-03-20 NOTE — Assessment & Plan Note (Addendum)
BP Readings from Last 3 Encounters:  03/20/15 149/73  03/05/15 110/56  02/28/15 115/80    Lab Results  Component Value Date   NA 141 03/10/2015   K 4.5 03/10/2015   CREATININE 1.49* 03/10/2015    Assessment: Blood pressure control:  controlled Progress toward BP goal:   at goal Comments: Lasix and cozaar were held at last visit due to AKI. On clonidine 0.3mg  patch and toprol xl 25mg . Pt complaining of feeling "draining" sensation that lasts for a few mins while walking when she is outside. Denies chest palpitations, blurry vision, and diaphoresis. She will sit down or slow down when she notices draining sensation.   Plan: Medications:  Continue clonidine and toprol xl. Resumed cozaar 50mg  qd now that AKI has resolved. Lasix was discontinued as BP is controlled and diuretic may have contributed to pre renal AKI.  Other plans: Orthostatic vitals neg, possible that draining sensation is due to heat or over exertion. Advised to increase po intake. f/u in 1 month for BP check.

## 2015-03-20 NOTE — Assessment & Plan Note (Signed)
Pt had repeat BMET on 5/16 with Cr of 1.49 which is at her baseline. FeUrea 23.7 on 5/11 indicating pre renal etiology. Likely AKI due to poor po intake along with lasix use. Orthostatic vitals negative this visit.   - AKI resolved - lasix has been discontinued - restarted cozaar 50mg  qd

## 2015-03-20 NOTE — Progress Notes (Signed)
   Subjective:    Patient ID: Dyke Maeslice Licht, female    DOB: [birth year], 64 y.o.   MRN: [id number]  HPI  Pt is a 64 y/o female w/ PMHx of DM, HTN, grade 1 dCHF (EF 55-60 2014), and depression who presents to clinic for f/u on acute on chronic kidney disease and DM. Please see problem list for further details.     Review of Systems  Respiratory: Negative for shortness of breath.   Cardiovascular: Negative for leg swelling.  Gastrointestinal: Positive for nausea and constipation. Negative for vomiting and diarrhea.  Endocrine: Negative for polydipsia and polyuria.  Neurological: Negative for headaches.       Objective:   Physical Exam  Constitutional: She appears well-developed and well-nourished. No distress.  Cardiovascular: Normal rate and regular rhythm.   Pulmonary/Chest: Effort normal and breath sounds normal.  Abdominal: Soft. Bowel sounds are normal.  Musculoskeletal: She exhibits no edema.  Skin: Skin is warm and dry.          Assessment & Plan:  Please see problem based assessment and plan.

## 2015-03-20 NOTE — Progress Notes (Signed)
Case discussed with Dr. Danella Pentonruong at time of visit. We reviewed the resident's history and exam and pertinent patient test results. I agree with the assessment, diagnosis, and plan of care documented in the resident's note.  "Feeling drained" could also be secondary to the combination of the beta blocker and the central alpha blocker which can cause bradycardia.  If these symptoms persist we could consider weaning clonidine as we titrate up his other antihypertensives.  There appears to be no indication for the lasix and this has been discontinued.

## 2015-03-20 NOTE — Patient Instructions (Signed)
Start taking cozaar 50mg  daily. I have discontinued Lasix.

## 2015-03-20 NOTE — Assessment & Plan Note (Signed)
Lab Results  Component Value Date   HGBA1C 7.8 01/08/2015   HGBA1C 9.3 10/23/2014   HGBA1C 9.5 08/14/2014     Assessment: Diabetes control:  not controlled Progress toward A1C goal:   progressing towards goal Comments: on januvia 50mg  qd and lantus 51 units qhs. Glucometer report ranges from 73-199.   Plan: Medications:  continue current medications Home glucose monitoring: Frequency:  TID  Timing:  before meals Instruction/counseling given: reminded to bring blood glucose meter & log to each visit Educational resources provided:   Self management tools provided: copy of home glucose meter download Other plans: f/u in 1 month for A1c

## 2015-03-21 ENCOUNTER — Telehealth: Payer: Self-pay | Admitting: Internal Medicine

## 2015-03-21 NOTE — Telephone Encounter (Signed)
Called Dr. Laruth BouchardGroat's office patient's LOV was 02/18/2015.  Report has been rec'd

## 2015-03-26 ENCOUNTER — Ambulatory Visit (HOSPITAL_COMMUNITY): Payer: Self-pay | Admitting: Clinical

## 2015-03-26 ENCOUNTER — Other Ambulatory Visit: Payer: Self-pay | Admitting: Internal Medicine

## 2015-03-26 DIAGNOSIS — Z1231 Encounter for screening mammogram for malignant neoplasm of breast: Secondary | ICD-10-CM

## 2015-03-26 DIAGNOSIS — G5601 Carpal tunnel syndrome, right upper limb: Secondary | ICD-10-CM | POA: Diagnosis not present

## 2015-03-26 DIAGNOSIS — G5602 Carpal tunnel syndrome, left upper limb: Secondary | ICD-10-CM | POA: Diagnosis not present

## 2015-03-27 ENCOUNTER — Other Ambulatory Visit: Payer: Self-pay | Admitting: Internal Medicine

## 2015-03-27 ENCOUNTER — Encounter (HOSPITAL_COMMUNITY): Payer: Self-pay | Admitting: Clinical

## 2015-03-27 ENCOUNTER — Ambulatory Visit (INDEPENDENT_AMBULATORY_CARE_PROVIDER_SITE_OTHER): Payer: 59 | Admitting: Clinical

## 2015-03-27 DIAGNOSIS — F331 Major depressive disorder, recurrent, moderate: Secondary | ICD-10-CM

## 2015-03-27 NOTE — Progress Notes (Signed)
THERAPIST PROGRESS NOTE  Session Time: 10:00 -10:58  Participation Level: Active  Behavioral Response: CasualAlertDepressed  Type of Therapy: Individual Therapy  Treatment Goals addressed: Improve Psychiatric Symptoms,  Emotional Regulation Skills, Interpersonal Relationship Skills  Interventions: Motivational Interviewing and  CBT  Summary: [name] is a 64 y.o. female who presents with Major Depressive Disorder.   Suicidal/Homicidal: No -without intent/plan  Therapist Response:  Ardelia met with clinician for an individual session. Demari shared about her psychiatric symptoms, her current life events and her homework.Client and clinician discussed how isolation affects her mental health and her efforts to improve it. Latorya shared that she had been working to reach out to others as discussed in the last session.  Han shared about her positive experiences, what she has noticed, and what is most challenging for her.Client and clinician discussed alternative ways to address and challenge her views. Rubye agreed to continue to try to connect with others. Autymn shared that she had also been working to do more self care. Saniah reported that on the days she is able to do so she feels better. Client and clinician discussed how she has been improving her experience through her own thoughts and actions. Andra shared her thoughts and insights about her ability to regulate her emotions. Harlie shared that on some days she is able to do so and on other days she has not been able to do so. Desma shared her thoughts about what caused the difference. Mickaela shared that sometimes her medical issues contribute to her symptoms. Hallelujah shared about her efforts to address her medical issues. Zoriana agreed to continues to do her  Homework -grounding and mindfulness and gratitude journal, interacting with others.  Plan: Return again in 2 weeks.  Diagnosis:     Axis I: Major Depressive Disorder   Velda Wendt  A, LCSW 03/27/2015

## 2015-03-28 DIAGNOSIS — R1013 Epigastric pain: Secondary | ICD-10-CM | POA: Diagnosis not present

## 2015-03-28 DIAGNOSIS — R110 Nausea: Secondary | ICD-10-CM | POA: Diagnosis not present

## 2015-03-28 DIAGNOSIS — K5900 Constipation, unspecified: Secondary | ICD-10-CM | POA: Diagnosis not present

## 2015-04-01 ENCOUNTER — Telehealth: Payer: Self-pay | Admitting: *Deleted

## 2015-04-01 ENCOUNTER — Ambulatory Visit (HOSPITAL_BASED_OUTPATIENT_CLINIC_OR_DEPARTMENT_OTHER): Payer: Medicare Other | Admitting: Physical Medicine & Rehabilitation

## 2015-04-01 ENCOUNTER — Encounter: Payer: Medicare Other | Attending: Physical Medicine & Rehabilitation

## 2015-04-01 ENCOUNTER — Encounter: Payer: Self-pay | Admitting: Physical Medicine & Rehabilitation

## 2015-04-01 VITALS — BP 142/76 | HR 80 | Resp 14

## 2015-04-01 DIAGNOSIS — Z79899 Other long term (current) drug therapy: Secondary | ICD-10-CM | POA: Diagnosis not present

## 2015-04-01 DIAGNOSIS — M47817 Spondylosis without myelopathy or radiculopathy, lumbosacral region: Secondary | ICD-10-CM | POA: Diagnosis not present

## 2015-04-01 DIAGNOSIS — M5136 Other intervertebral disc degeneration, lumbar region: Secondary | ICD-10-CM

## 2015-04-01 DIAGNOSIS — M7711 Lateral epicondylitis, right elbow: Secondary | ICD-10-CM

## 2015-04-01 DIAGNOSIS — M4716 Other spondylosis with myelopathy, lumbar region: Secondary | ICD-10-CM

## 2015-04-01 DIAGNOSIS — G894 Chronic pain syndrome: Secondary | ICD-10-CM | POA: Insufficient documentation

## 2015-04-01 DIAGNOSIS — Z5181 Encounter for therapeutic drug level monitoring: Secondary | ICD-10-CM | POA: Insufficient documentation

## 2015-04-01 MED ORDER — DICLOFENAC SODIUM 1 % TD GEL: 2.0000 g | Freq: Four times a day (QID) | TRANSDERMAL | Status: DC

## 2015-04-01 NOTE — Telephone Encounter (Signed)
Recd PA request for Diclofenac Sodium 1% gel from Pharmacy.  Completed PA on cover my meds - Contact plan to follow up on [id number] (key). Waiting on response

## 2015-04-01 NOTE — Patient Instructions (Addendum)

## 2015-04-01 NOTE — Progress Notes (Signed)
Subjective:    Patient ID: [name], female    DOB: [birth year], 64 y.o.   MRN: [id number]  HPI Chief complaint is right forearm pain near the elbow. Patient denies any trauma to that area. It started about 3 months ago. Patient did not complain about this at the last visit which was on 12/30/2014. Pain increases when she twists her arm. The motion that she describes as both supination and pronation. Wrist motions also hurt the same spot. Patient denies any numbness or tingling that area. No weakness in the arm. No neck pain. No previous episodes of that. Past surgical history had carpal tunnel release in November 2015 on the right hand and in May 2016 in the left hand. She's had no right elbow surgery Pain Inventory Average Pain 10 Pain Right Now 4 My pain is constant, sharp and aching  In the last 24 hours, has pain interfered with the following? General activity 4 Relation with others 5 Enjoyment of life 4 What TIME of day is your pain at its worst? daytime and evening Sleep (in general) Good  Pain is worse with: some activites Pain improves with: rest Relief from Meds: 1  Mobility walk without assistance how many minutes can you walk? 10 ability to climb steps?  yes do you drive?  yes  Function disabled: date disabled .  Neuro/Psych bowel control problems weakness depression anxiety  Prior Studies Any changes since last visit?  no  Physicians involved in your care Any changes since last visit?  no   Family History  Problem Relation Age of Onset  . Diabetes Mother   . Hypertension Mother   . Hyperlipidemia Mother   . Arthritis Mother   . Depression Mother   . Heart disease Father   . Diabetes Brother   . Heart disease Brother   . Heart disease Paternal Grandmother   . Diabetes Brother    History   Social History  . Marital Status: Married    Spouse Name: N/A  . Number of Children: N/A  . Years of Education: N/A   Social History Main Topics    . Smoking status: Former Smoker    Types: Cigarettes    Quit date: 10/25/2002  . Smokeless tobacco: Never Used  . Alcohol Use: No  . Drug Use: No  . Sexual Activity: Not Currently   Other Topics Concern  . None   Social History Narrative   Past Surgical History  Procedure Laterality Date  . Abdominal hysterectomy    . Hand surgery  1992    rt  . Bowel resection N/A 01/10/2013    Procedure: SMALL BOWEL RESECTION;  Surgeon: Lodema PilotBrian Layton, DO;  Location: [facility];  Service: General;  Laterality: N/A;  . Lysis of adhesion N/A 01/10/2013    Procedure: LYSIS OF ADHESION;  Surgeon: Lodema PilotBrian Layton, DO;  Location: [facility];  Service: General;  Laterality: N/A;  . Laparotomy N/A 01/10/2013    Procedure: EXPLORATORY LAPAROTOMY;  Surgeon: Lodema PilotBrian Layton, DO;  Location: [facility];  Service: General;  Laterality: N/A;  . Incisional hernia repair N/A 03/01/2014    Procedure: LAPAROSCOPIC INCISIONAL HERNIA;  Surgeon: Axel FillerArmando Ramirez, MD;  Location: [facility];  Service: General;  Laterality: N/A;  . Insertion of mesh N/A 03/01/2014    Procedure: INSERTION OF MESH;  Surgeon: Axel FillerArmando Ramirez, MD;  Location: [facility];  Service: General;  Laterality: N/A;  . Hernia repair  03/01/14    Lap incisional hernia repair w/mesh  . Appendectomy    .  Carpal tunnel release Right 09/13/2014    Procedure: RIGHT WRIST CARPAL TUNNEL RELEASE;  Surgeon: Eulas Post, MD;  Location: [facility];  Service: Orthopedics;  Laterality: Right;  . Steriod injection Left 09/13/2014    Procedure: STEROID INJECTION LEFT HAND;  Surgeon: Eulas Post, MD;  Location: [facility];  Service: Orthopedics;  Laterality: Left;  . Carpal tunnel release Left 02/28/2015    Procedure: LEFT CARPAL TUNNEL RELEASE;  Surgeon: Teryl Lucy, MD;  Location: [facility];  Service: Orthopedics;  Laterality: Left;   Past Medical History  Diagnosis Date  . Hypertension   . Peripheral neuropathy   . Cervicalgia   . Low back  pain syndrome   . Hyperlipidemia   . Vitamin D deficiency   . Vitamin B12 deficiency   . Iliotibial band syndrome   . Anemia   . Borderline personality disorder   . Nonorganic psychosis   . GERD (gastroesophageal reflux disease)   . Diabetes mellitus   . Depression   . Diabetic gastroparesis   . CAD (coronary artery disease)     Catherization 11/18/09>nonobstructive CAD , normal LV function, EF 65%  . Health maintenance examination     Colonoscopy 2009> normal; Diabetic eye exam 12/2008. No diabetic retinopathy; Mammogram 11/10> No evidence of malignancy, DXA 03/14/13 : normal  . Headache(784.0)   . Arthritis   . SBO (small bowel obstruction) 01/09/2013  . Diastolic CHF     Grade I, on Echo 06/2013, EF 55-60%  . Sleep apnea     does not wear CPAP   . Diabetes mellitus, type II   . Wears glasses   . Wears dentures     upper  . Gout   . Right carpal tunnel syndrome 09/13/2014   BP 142/76 mmHg  Pulse 80  Resp 14  SpO2 98%  Opioid Risk Score:   Fall Risk Score: Low Fall Risk (0-5 points)`1  Depression screen PHQ 2/9  Depression screen [facility] 2/9 03/20/2015 03/05/2015 02/10/2015 01/08/2015 11/07/2014 10/23/2014 08/14/2014  Decreased Interest 0 0 1 0 2 3 0  Down, Depressed, Hopeless 0 0 PHQ - 2 Score 0 0 Altered sleeping - - 0 - 1 1 -  Tired, decreased energy - - 1 - 3 3 -  Change in appetite - - 1 - 1 0 -  Feeling bad or failure about yourself  - - 0 - 3 1 -  Trouble concentrating - - 0 - 0 0 -  Moving slowly or fidgety/restless - - 0 - 1 3 -  Suicidal thoughts - - 0 - 1 1 -  PHQ-9 Score - - 5 - 15 15 -     Review of Systems  HENT: Negative.   Eyes: Negative.   Respiratory: Negative.   Cardiovascular: Negative.   Gastrointestinal: Positive for nausea, abdominal pain and constipation.       Bowel control problems  Endocrine: Negative.   Genitourinary: Negative.   Musculoskeletal: Positive for myalgias, back pain and arthralgias.  Skin: Negative.     Allergic/Immunologic: Negative.   Neurological: Positive for weakness.  Hematological: Negative.   Psychiatric/Behavioral: Positive for dysphoric mood. The patient is nervous/anxious.        Objective:   Physical Exam  Constitutional: She is oriented to person, place, and time. She appears well-developed and well-nourished.  HENT:  Head: Normocephalic and atraumatic.  Eyes: Conjunctivae are normal. Pupils  are equal, round, and reactive to light.  Musculoskeletal:       Right elbow: She exhibits normal range of motion, no swelling and no effusion. Tenderness found. Lateral epicondyle tenderness noted.  Neurological: She is alert and oriented to person, place, and time.  Psychiatric: She has a normal mood and affect.  Nursing note and vitals reviewed.  Patient has pain with resisted wrist extension. No pain along the medial epicondyle. Normal strength in bilateral upper extremities Normal sensation bilateral upper extremities Left wrist has healing carpal tunnel incision       Assessment & Plan:  1. Lateral epicondylitis right elbow-Trial Diclofenac gel, exercises given, may need PT or OT if this does not help  2. Chronic low back pain. Appears to be combination of discogenic and spondylosis. She did get some relief with Medial branch blocks but none with radiofrequency. Continue Tylenol No. 4 2 tablets twice a day No signs of misuse Continue opioid monitoring program. This consists of regular clinic visits, examinations, urine drug screen, pill counts as well as use of [place] controlled substance reporting System.

## 2015-04-02 NOTE — Telephone Encounter (Signed)
rec'd PA approval today, pt notified, pharmacy notified

## 2015-04-04 ENCOUNTER — Ambulatory Visit (INDEPENDENT_AMBULATORY_CARE_PROVIDER_SITE_OTHER): Payer: 59 | Admitting: Psychiatry

## 2015-04-04 VITALS — BP 132/72 | HR 71 | Ht 65.0 in | Wt 184.4 lb

## 2015-04-04 DIAGNOSIS — F332 Major depressive disorder, recurrent severe without psychotic features: Secondary | ICD-10-CM | POA: Diagnosis not present

## 2015-04-04 DIAGNOSIS — F331 Major depressive disorder, recurrent, moderate: Secondary | ICD-10-CM

## 2015-04-04 MED ORDER — CLONAZEPAM 0.5 MG PO TABS: 0.5000 mg | ORAL_TABLET | Freq: Three times a day (TID) | ORAL | Status: DC

## 2015-04-04 MED ORDER — BUPROPION HCL ER (XL) 150 MG PO TB24: 450.0000 mg | ORAL_TABLET | Freq: Every day | ORAL | Status: DC

## 2015-04-04 MED ORDER — TRAZODONE HCL 50 MG PO TABS: 50.0000 mg | ORAL_TABLET | Freq: Every day | ORAL | Status: DC

## 2015-04-04 MED ORDER — SERTRALINE HCL 100 MG PO TABS: 100.0000 mg | ORAL_TABLET | Freq: Every day | ORAL | Status: DC

## 2015-04-04 NOTE — Progress Notes (Signed)
[facility] MD Progress Note  04/04/2015 10:31 AM [name]  MRN:  [id number] Subjective: Fair The patient is doing fairly well. She slowly showing a slow improvement. She continues in therapy working on her issues on a weekly basis. It is noted this patient was psychiatrically hospitalized in the beginning of this year 6 months ago when she was depressed and suicidal. At this time she acknowledges that she 60-70% better. She is not yet back to her baseline. She takes the maximum dose of Wellbutrin but only takes 50 mg of Zoloft. The patient denies persistent daily depression. She describes some degree of anhedonia but in reality she does really enjoy watching television looking at forensic shows. She still enjoys her 9--year-old grandson. The patient says that she sleeping fine but her appetite is somewhat reduced. She's having a lot of GI problems and has an endoscopy scheduled in a month or 2. The patient is dealing with a lot of pain. She recently had carpal tunnel surgery in her left wrist which is slowly improving. The patient's energy level is low. She is able to think and concentrate fairly well. She is no persistent suicidal thinking. The patient has multiple medical illnesses including diabetes and heart failure. Overall is noted the patient is progressing and getting better but now after 6 months is not near her baseline. She has a reasonably good relationship with her husband. Principal Problem: Major depression, recurrent severe without psychotic features Diagnosis:   Patient Active Problem List   Diagnosis Date Noted  . Degenerative lumbar disc [M51.36] 12/30/2014  . Cough [R05]   . MDD (major depressive disorder), recurrent severe, without psychosis [F33.2] 11/11/2014  . Major depressive disorder, recurrent severe without psychotic features [F33.2]   . Numbness and tingling in left hand [R20.2] 09/13/2014  . Right carpal tunnel syndrome [G56.01] 09/13/2014  . Degenerative joint disease  involving multiple joints on both sides of body (right foot pain) [M15.9] 08/14/2014  . Spondylosis of lumbar region without myelopathy or radiculopathy [M47.816] 08/06/2014  . Lumbosacral spondylosis without myelopathy [M47.817] 07/09/2014  . 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 TMJ (dislocation of temporomandibular joint) [S03.0XXA] 06/04/2014  . Chronic low back pain [M54.5, G89.29] 04/15/2014  . Periumbilical hernia [K42.9] 02/02/2014  . Hot flashes [N95.1] 01/02/2014  . Diastolic CHF [I50.30] 07/11/2013  . Major depressive disorder, recurrent episode, unspecified [F33.9] 06/20/2013  . Fibromyalgia syndrome [M79.7] 06/18/2013  . Abdominal pain, epigastric [R10.13] 03/29/2013  . Myalgia [M79.1] 12/21/2012  . Chronic chest pain [R07.9, G89.29] 11/21/2012  . Carpal tunnel syndrome, bilateral [G56.01, G56.02] 07/25/2012  . Tension headache, chronic [G44.229] 05/24/2012  . Iron deficiency anemia [D50.9] 09/22/2011  . Depression [F32.9] 07/14/2011  . Anxiety [F41.9] 06/02/2011  . Acute renal failure superimposed on stage 3 chronic kidney disease [N17.9, N18.3] 01/22/2011  . Lumbar spondylosis with myelopathy [M47.16] 11/20/2010  . Coronary atherosclerosis [I25.10] 03/10/2010  . Dyslipidemia [E78.5] 12/11/2008  . VITAMIN D DEFICIENCY [E55.9] 11/26/2008  . Uncontrolled type 2 diabetes mellitus with insulin therapy [E11.65] 05/25/2007  . PERIPHERAL NEUROPATHY [G60.9] 05/25/2007  . Essential hypertension [I10] 05/25/2007  . GERD [K21.9] 05/25/2007  . SYMPTOM, APNEA, SLEEP NOS [G47.30] 05/25/2007   Total Time spent with patient: 30 minutes   Past Medical History:  Past Medical History  Diagnosis Date  . Hypertension   . Peripheral neuropathy   . Cervicalgia   . Low back pain syndrome   . Hyperlipidemia   . Vitamin D deficiency   . Vitamin B12 deficiency   .  Iliotibial band syndrome   . Anemia   . Borderline personality disorder   . Nonorganic psychosis   . GERD  (gastroesophageal reflux disease)   . Diabetes mellitus   . Depression   . Diabetic gastroparesis   . CAD (coronary artery disease)     Catherization 11/18/09>nonobstructive CAD , normal LV function, EF 65%  . Health maintenance examination     Colonoscopy 2009> normal; Diabetic eye exam 12/2008. No diabetic retinopathy; Mammogram 11/10> No evidence of malignancy, DXA 03/14/13 : normal  . Headache(784.0)   . Arthritis   . SBO (small bowel obstruction) 01/09/2013  . Diastolic CHF     Grade I, on Echo 06/2013, EF 55-60%  . Sleep apnea     does not wear CPAP   . Diabetes mellitus, type II   . Wears glasses   . Wears dentures     upper  . Gout   . Right carpal tunnel syndrome 09/13/2014    Past Surgical History  Procedure Laterality Date  . Abdominal hysterectomy    . Hand surgery  1992    rt  . Bowel resection N/A 01/10/2013    Procedure: SMALL BOWEL RESECTION;  Surgeon: Lodema Pilot, DO;  Location: [facility];  Service: General;  Laterality: N/A;  . Lysis of adhesion N/A 01/10/2013    Procedure: LYSIS OF ADHESION;  Surgeon: Lodema Pilot, DO;  Location: [facility];  Service: General;  Laterality: N/A;  . Laparotomy N/A 01/10/2013    Procedure: EXPLORATORY LAPAROTOMY;  Surgeon: Lodema Pilot, DO;  Location: [facility];  Service: General;  Laterality: N/A;  . Incisional hernia repair N/A 03/01/2014    Procedure: LAPAROSCOPIC INCISIONAL HERNIA;  Surgeon: Axel Filler, MD;  Location: [facility];  Service: General;  Laterality: N/A;  . Insertion of mesh N/A 03/01/2014    Procedure: INSERTION OF MESH;  Surgeon: Axel Filler, MD;  Location: [facility];  Service: General;  Laterality: N/A;  . Hernia repair  03/01/14    Lap incisional hernia repair w/mesh  . Appendectomy    . Carpal tunnel release Right 09/13/2014    Procedure: RIGHT WRIST CARPAL TUNNEL RELEASE;  Surgeon: Eulas Post, MD;  Location: [facility];  Service: Orthopedics;  Laterality: Right;  . Steriod injection Left 09/13/2014     Procedure: STEROID INJECTION LEFT HAND;  Surgeon: Eulas Post, MD;  Location: [facility];  Service: Orthopedics;  Laterality: Left;  . Carpal tunnel release Left 02/28/2015    Procedure: LEFT CARPAL TUNNEL RELEASE;  Surgeon: Teryl Lucy, MD;  Location: [facility];  Service: Orthopedics;  Laterality: Left;   Family History:  Family History  Problem Relation Age of Onset  . Diabetes Mother   . Hypertension Mother   . Hyperlipidemia Mother   . Arthritis Mother   . Depression Mother   . Heart disease Father   . Diabetes Brother   . Heart disease Brother   . Heart disease Paternal Grandmother   . Diabetes Brother    Social History:  History  Alcohol Use No     History  Drug Use No    History   Social History  . Marital Status: Married    Spouse Name: N/A  . Number of Children: N/A  . Years of Education: N/A   Social History Main Topics  . Smoking status: Former Smoker    Types: Cigarettes    Quit date: 10/25/2002  . Smokeless tobacco: Never Used  . Alcohol Use: No  .  Drug Use: No  . Sexual Activity: Not Currently   Other Topics Concern  . Not on file   Social History Narrative   Additional History:    Sleep: Good  Appetite:  Good   Assessment:   Musculoskeletal: Strength & Muscle Tone:  Gait & Station:  Patient leans:    Psychiatric Specialty Exam: Physical Exam  ROS  Blood pressure 132/72, pulse 71, height  (1.651 m), weight 184 lb 6.4 oz (83.643 kg).Body mass index is 30.69 kg/(m^2).  General Appearance: Casual  Eye Contact::  Good  Speech:  Clear and Coherent  Volume:  Normal  Mood ; fair  Affect:  Blunt  Thought Process:  Coherent  Orientation:  Full (Time, Place, and Person)  Thought Content:  WDL  Suicidal Thoughts:  No  Homicidal Thoughts:  No  Memory:  NA  Judgement:  NA  Insight:  Good  Psychomotor Activity:  NA and Normal  Concentration:  Fair  Recall:  Good  Fund of Knowledge:Fair   Language: Good  Akathisia:  No  Handed:  Right  AIMS (if indicated):     Assets:  Desire for Improvement  ADL's:  Intact  Cognition: WNL  Sleep:        Current Medications: Current Outpatient Prescriptions  Medication Sig Dispense Refill  . ACCU-CHEK FASTCLIX LANCETS MISC TEST five times a day 102 each 11  . acetaminophen-codeine (TYLENOL #4) 300-60 MG per tablet   0  . albuterol (PROVENTIL HFA;VENTOLIN HFA) 108 (90 BASE) MCG/ACT inhaler Inhale 2 puffs into the lungs every 6 (six) hours as needed for wheezing or shortness of breath. 1 Inhaler 2  . aspirin 81 MG chewable tablet Chew 81 mg by mouth daily.    . BD PEN NEEDLE NANO U/F 32G X 4 MM MISC TEST UPTO 5 TIMES DAILY 100 each 4  . buPROPion (WELLBUTRIN XL) 150 MG 24 hr tablet Take 3 tablets (450 mg total) by mouth daily. 270 tablet 1  . clonazePAM (KLONOPIN) 0.5 MG tablet Take 1 tablet (0.5 mg total) by mouth 3 (three) times daily. For anxiety 270 tablet 1  . cloNIDine (CATAPRES - DOSED IN MG/24 HR) 0.3 mg/24hr patch Place 1 patch (0.3 mg total) onto the skin every 7 (seven) days. 4 patch 11  . dexlansoprazole (DEXILANT) 60 MG capsule Take 1 capsule (60 mg total) by mouth daily. For reflux 30 capsule   . diclofenac sodium (VOLTAREN) 1 % GEL Apply 2 g topically 4 (four) times daily. 3 Tube 1  . Docusate Sodium (COLACE PO) Take 2 tablets by mouth daily as needed (for constipation).     Marland Kitchen glucose blood (ACCU-CHEK AVIVA PLUS) test strip Check blood sugar up to 4 times a day as instructed 125 each 5  . Insulin Glargine (LANTUS SOLOSTAR) 100 UNIT/ML Solostar Pen Inject 51 Units into the skin at bedtime. 45 mL 4  . isosorbide mononitrate (IMDUR) 60 MG 24 hr tablet Take 1 tablet (60 mg total) by mouth every morning. 90 tablet 1  . Linaclotide (LINZESS) 145 MCG CAPS capsule Take 145 mcg by mouth daily.    Marland Kitchen losartan (COZAAR) 50 MG tablet Take 1 tablet (50 mg total) by mouth daily. 90 tablet 4  . metoprolol succinate (TOPROL-XL) 25 MG 24 hr  tablet Take 1 tablet (25 mg total) by mouth every morning. 90 tablet 4  . ondansetron (ZOFRAN) 4 MG tablet Take 4 mg by mouth every 8 (eight) hours as needed for nausea.     Marland Kitchen  rosuvastatin (CRESTOR) 10 MG tablet Take 1 tablet (10 mg total) by mouth at bedtime. 30 tablet 11  . senna (SENOKOT) 8.6 MG TABS Take 4 tablets by mouth daily as needed (for constipation).     . sertraline (ZOLOFT) 100 MG tablet Take 1 tablet (100 mg total) by mouth daily. 90 tablet 1  . sitaGLIPtin (JANUVIA) 50 MG tablet Take 1 tablet (50 mg total) by mouth daily. 90 tablet 0  . traZODone (DESYREL) 50 MG tablet Take 1 tablet (50 mg total) by mouth at bedtime. 90 tablet 1   No current facility-administered medications for this visit.    Lab Results: No results found for this or any previous visit (from the past 48 hour(s)).  Physical Findings: AIMS:  , ,  ,  ,    CIWA:    COWS:     Treatment Plan Summary: At this time we will go ahead and increase this patient's Zoloft from 50 mg 2 a dose of 100 mg. She'll continue on 450 mg of Wellbutrin. The patient also takes 50 mg of trazodone and continues to take Klonopin0.5 mg 3 times a day. The patient will continue in talking therapy in the setting. This patient to return to see me in 3 months. This patient is not suicidal not psychotic and continues to slowly improve.   Medical Decision Making:  Self-Limited or Minor (1)     Herberta Pickron IRVING 04/04/2015, 10:31 AM

## 2015-04-07 ENCOUNTER — Other Ambulatory Visit (HOSPITAL_COMMUNITY): Payer: Self-pay | Admitting: Psychiatry

## 2015-04-10 ENCOUNTER — Ambulatory Visit (HOSPITAL_COMMUNITY): Payer: Self-pay | Admitting: Clinical

## 2015-04-14 ENCOUNTER — Telehealth (HOSPITAL_COMMUNITY): Payer: Self-pay

## 2015-04-14 NOTE — Telephone Encounter (Signed)
Telephone message left for patient after she called earlier today and left a message questioning if Dr. Donell Beers called in her Klonopin from 04/04/15 visit.  Left message it appeared Dr. Donell Beers printed and gave patient a 21 dy order that date plus one refill.  Requested patient call back if she is unable to locate prescription as this nurse would then have to have Dr. Donell Beers authorize this nurse to call in requested order.

## 2015-04-15 IMAGING — CR DG ABDOMEN ACUTE W/ 1V CHEST
3 series · 3 of 3 positions shown · non-contrast
Comparison: [DATE] [DATE], [DATE]; [DATE] [DATE], [DATE].

CLINICAL DATA: Lower abdominal pain for 5 days.

EXAM:
ACUTE ABDOMEN SERIES (ABDOMEN 2 VIEW & CHEST 1 VIEW)

[chest pa]
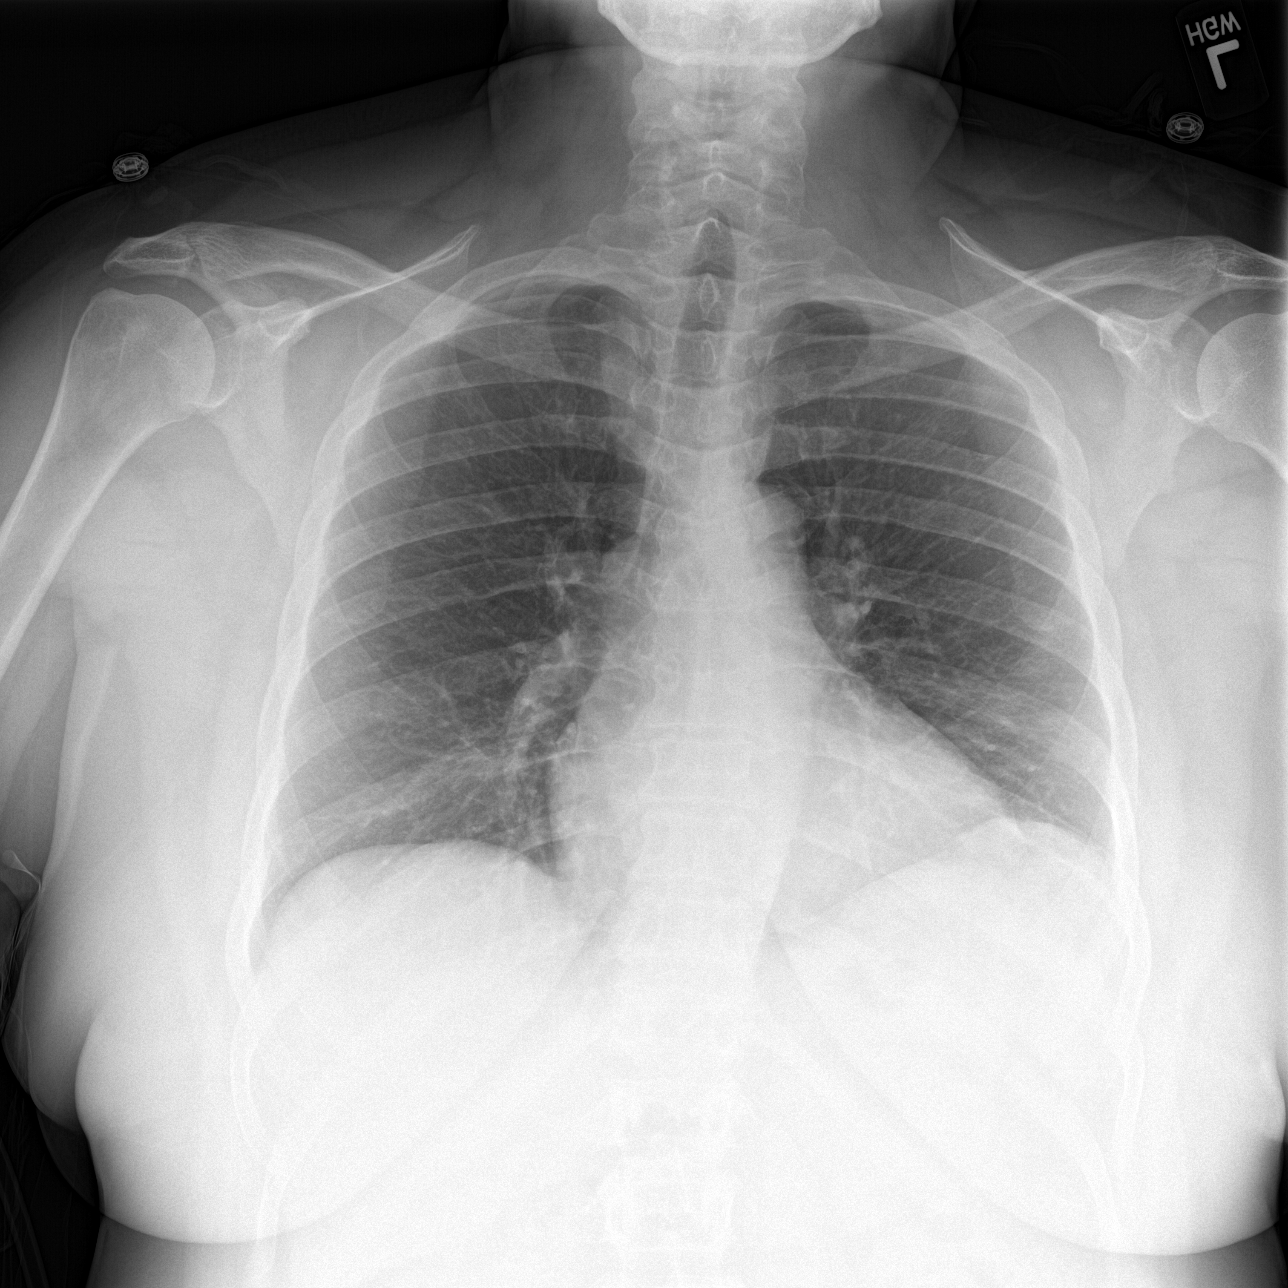

[abdomen erect]
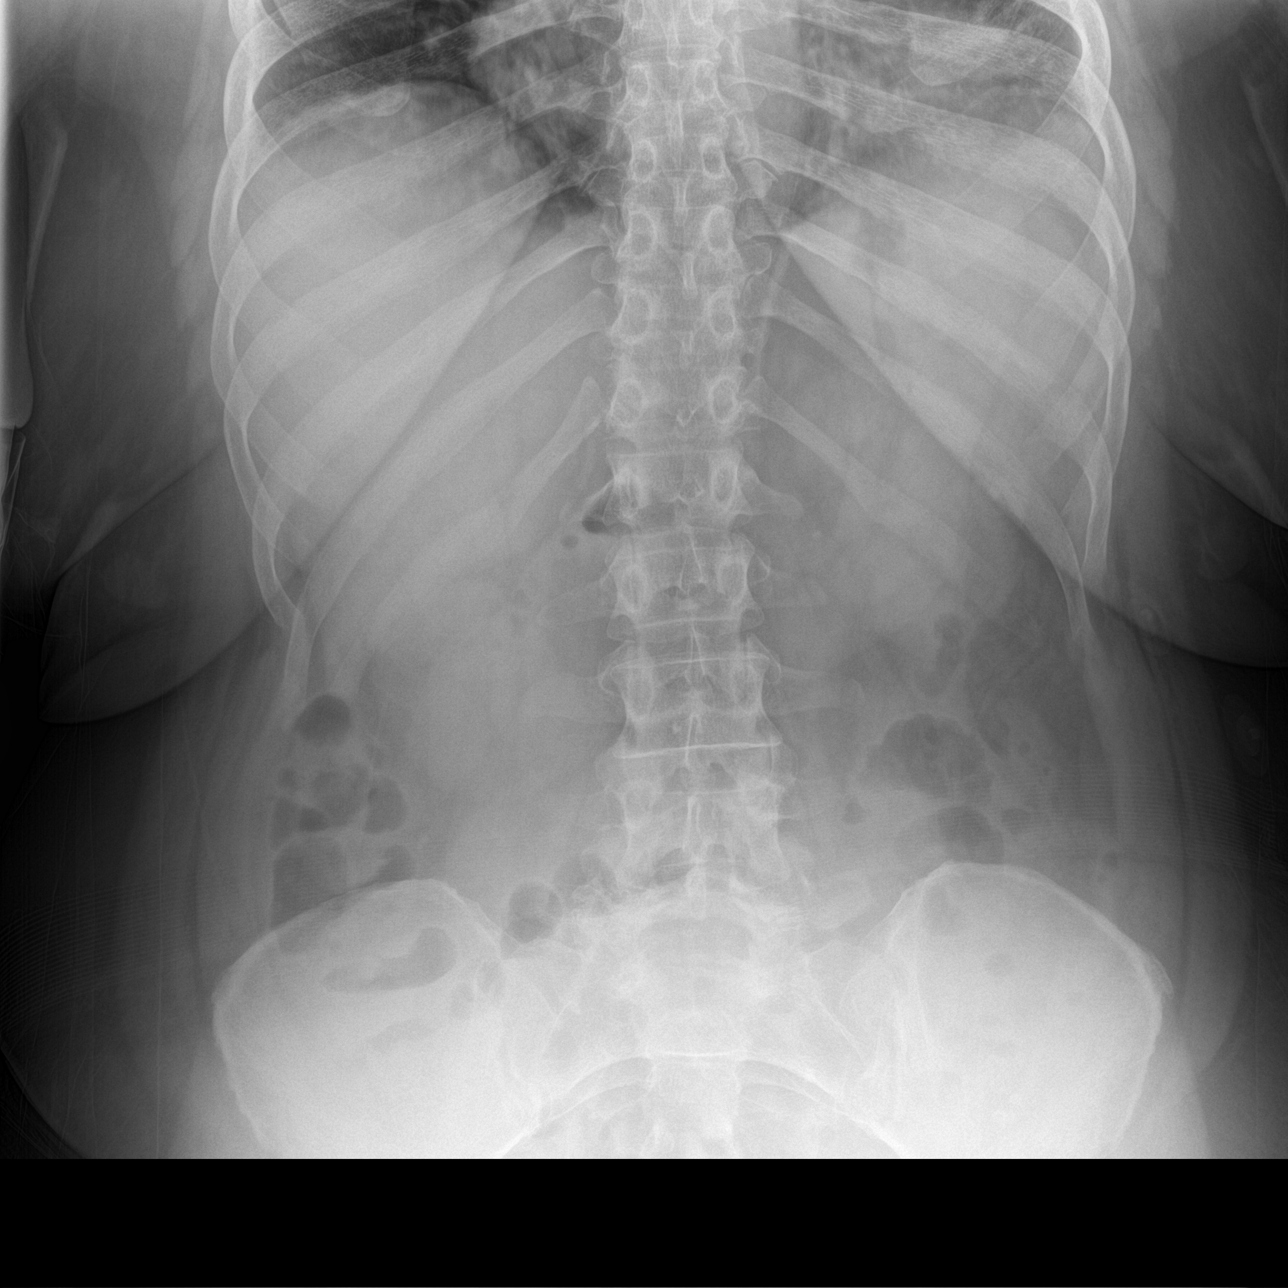

[abdomen supine]
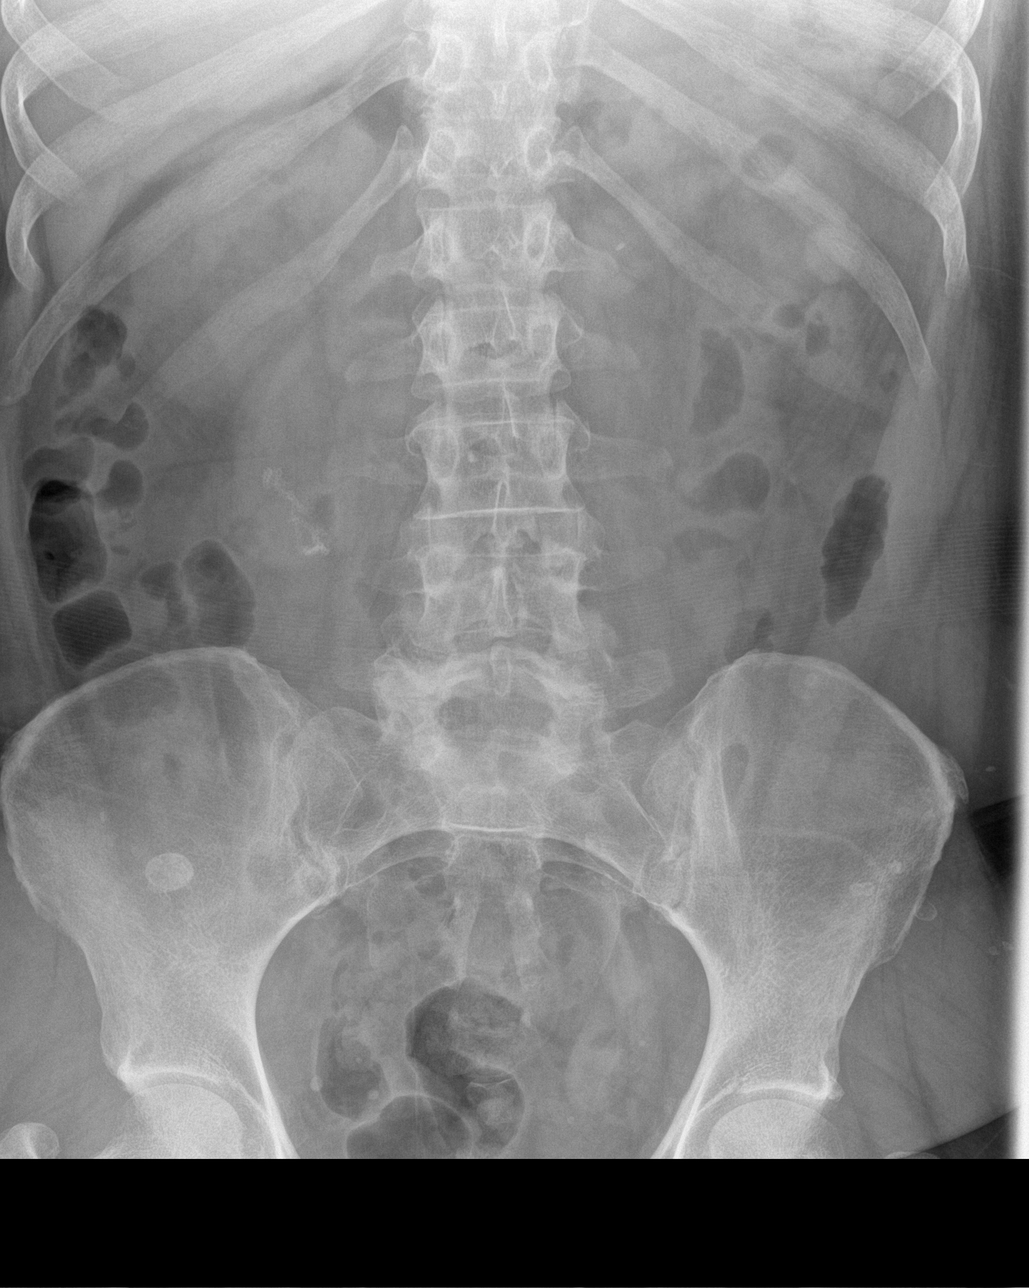

[3 of 3 positions shown; findings below may reference images not displayed]

FINDINGS: There is no evidence of dilated bowel loops or free intraperitoneal
air. Phleboliths are noted in the pelvis. Surgical clips are seen in
the right side of the abdomen. Heart size and mediastinal contours
are within normal limits. Both lungs are clear.
IMPRESSION: No evidence of bowel obstruction or ileus. No acute cardiopulmonary
disease.

## 2015-04-15 IMAGING — CT CT ABD-PELV W/ CM
2 of 5 series · 16 of 46 positions shown, 18 images · IV contrast (Omni 300)
Comparison: CT 01/30/2014

CLINICAL DATA: Five days of epigastric pain, loss of appetite

EXAM:
CT ABDOMEN AND PELVIS WITH CONTRAST
TECHNIQUE: Multidetector CT imaging of the abdomen and pelvis was performed
using the standard protocol following bolus administration of
intravenous contrast.
CONTRAST:  80mL OMNIPAQUE IOHEXOL 300 MG/ML  SOLN

[Series 2: abd/ pelvis 5.0 i30f 1 · axial · 0.74mm/px · z∈[-274,+126]mm · 13 of 90 slices shown, 15 images]
[im 5/90  soft-tissue]
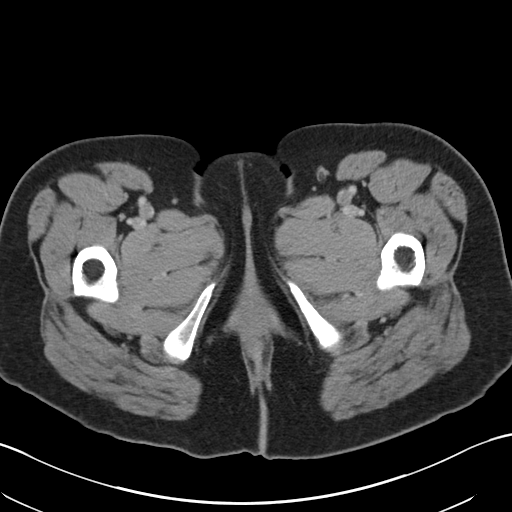
[im 5/90  bone]
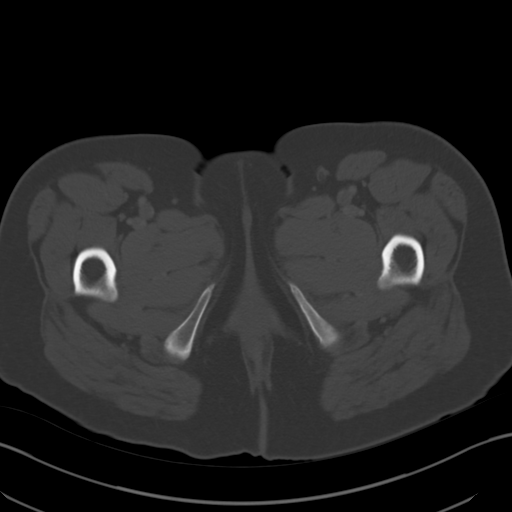
[im 15/90  soft-tissue]
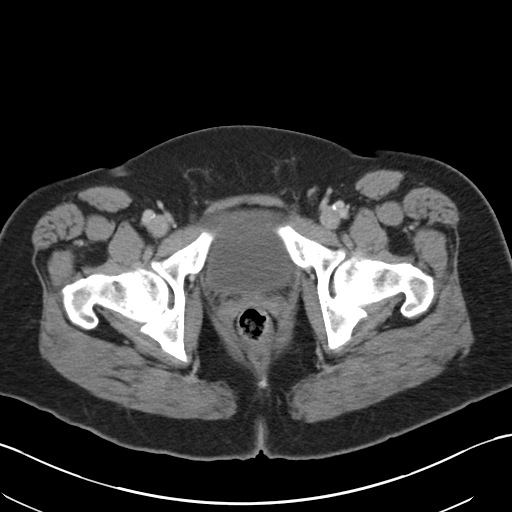
[im 19/90  soft-tissue]
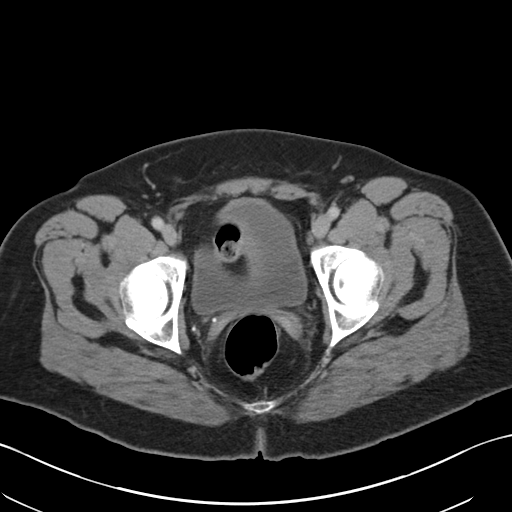
[im 24/90  soft-tissue]
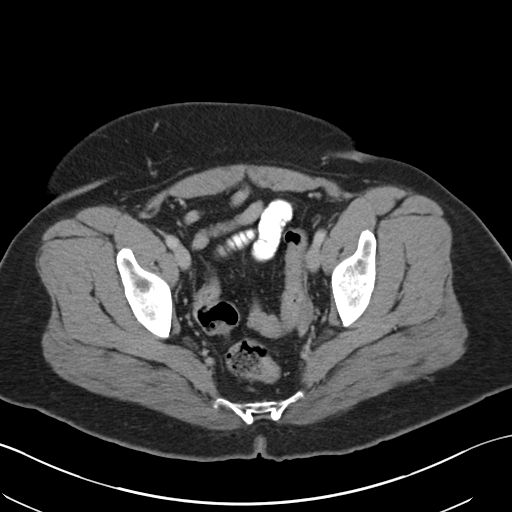
[im 33/90  soft-tissue]
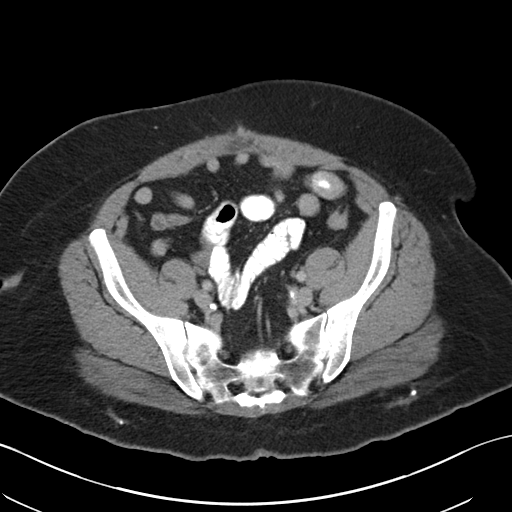
[im 38/90  soft-tissue]
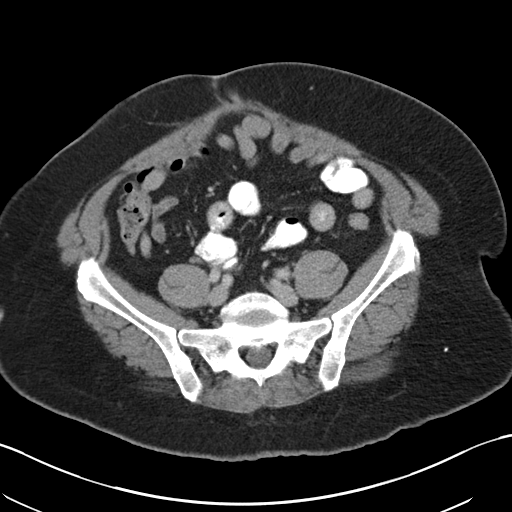
[im 47/90  soft-tissue]
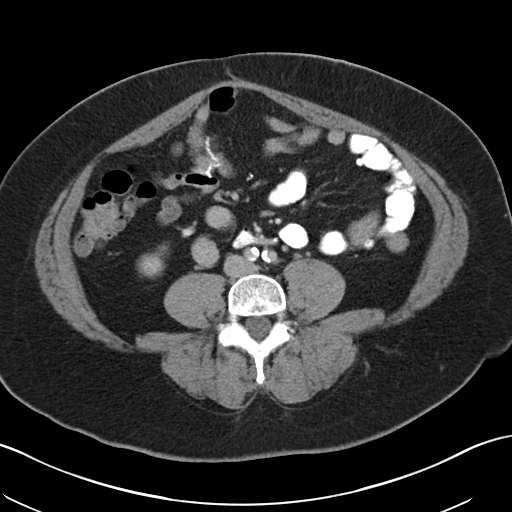
[im 52/90  soft-tissue]
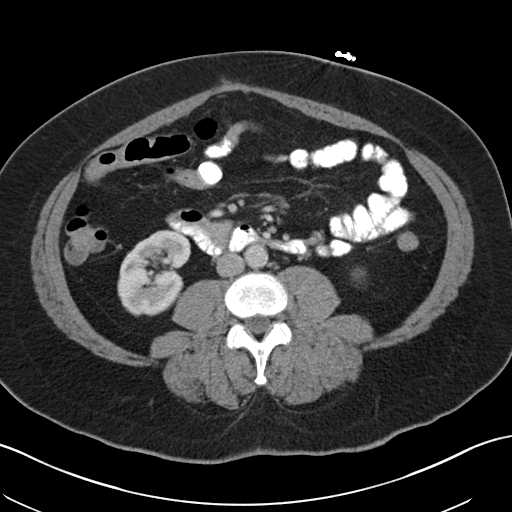
[im 57/90  soft-tissue]
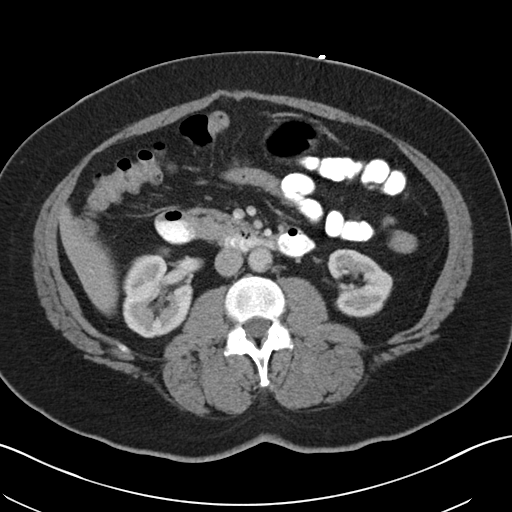
[im 57/90  bone]
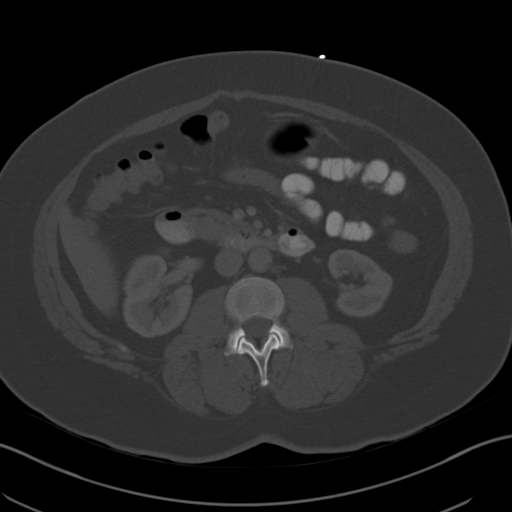
[im 66/90  soft-tissue]
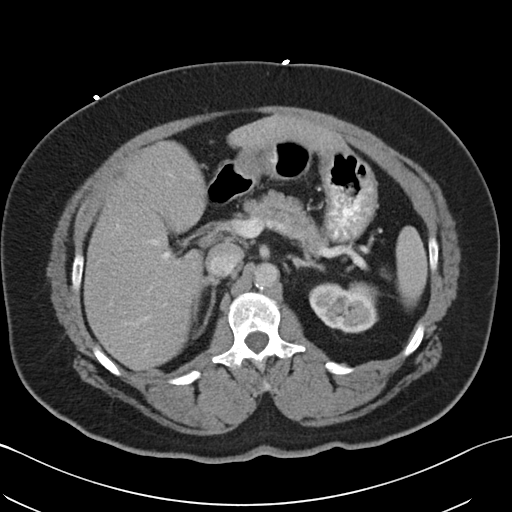
[im 71/90  soft-tissue]
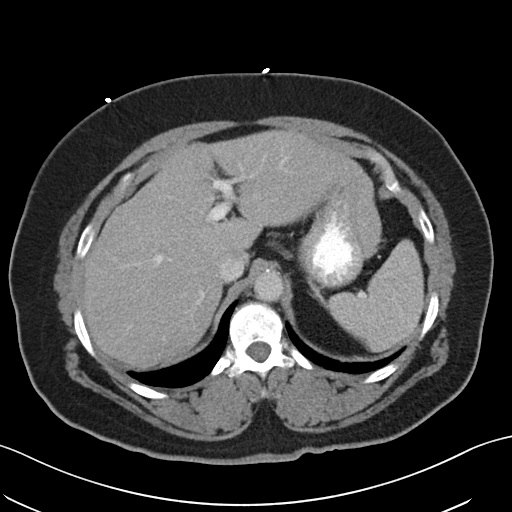
[im 75/90  soft-tissue]
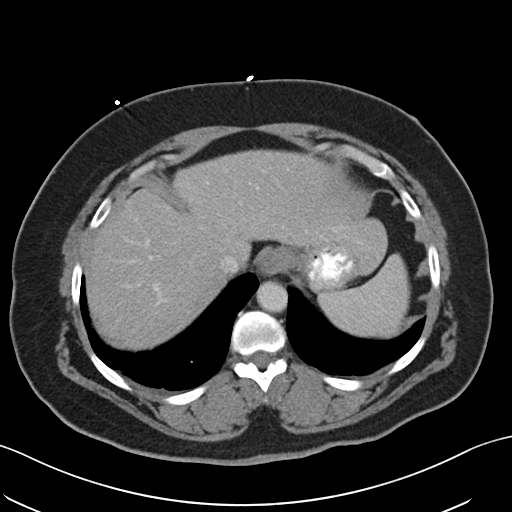
[im 85/90  soft-tissue]
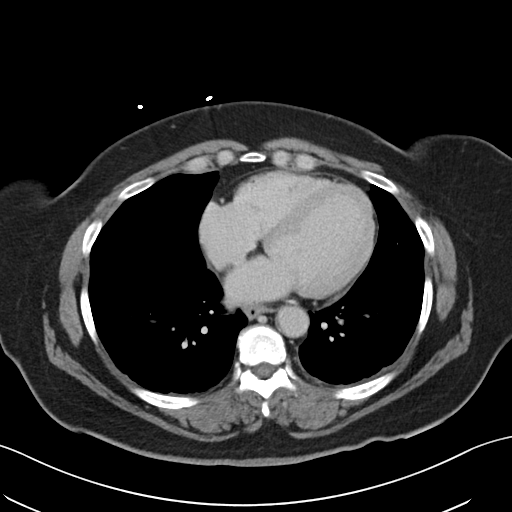

[Series 5: coronals · coronal · 0.75mm/px · 3 of 151 slices shown]
[im 51/151  soft-tissue]
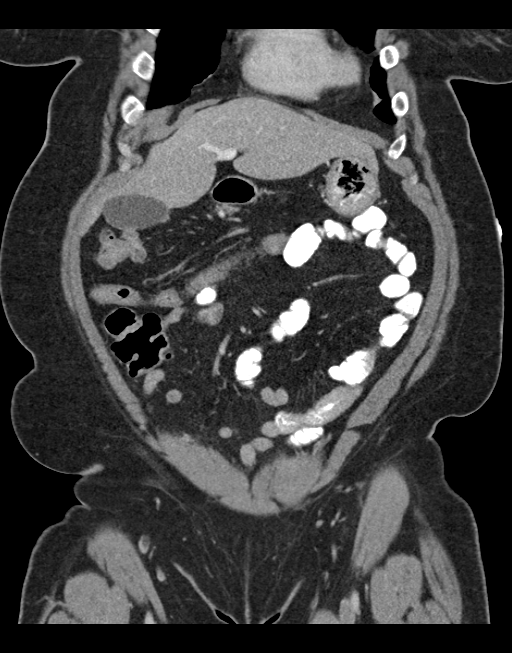
[im 67/151  soft-tissue]
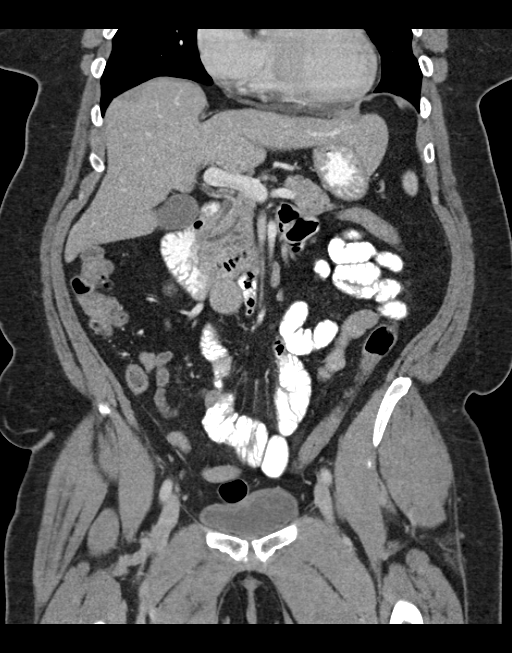
[im 84/151  soft-tissue]
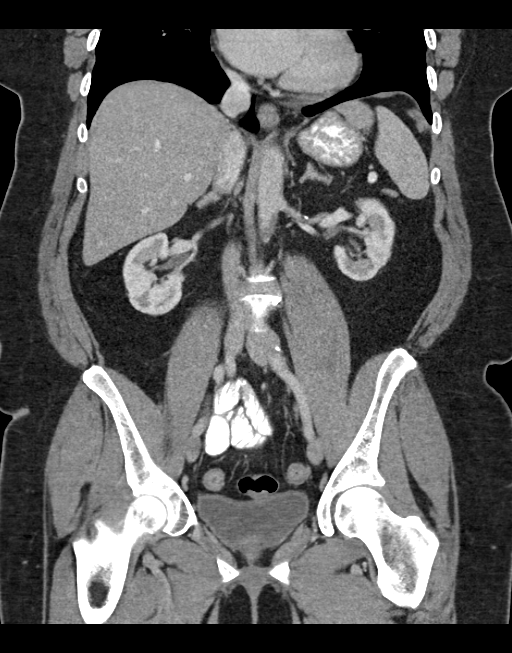

[16 of 46 positions shown; findings below may reference images not displayed]

FINDINGS: Lower chest: Lung bases are clear. Small focus of atelectasis at the
right lung base.

Hepatobiliary: No focal hepatic lesion.  Normal gallbladder.

Pancreas: Pancreas is normal. No ductal dilatation. No pancreatic
inflammation.

Spleen: Normal spleen.

Adrenals/urinary tract: Adrenal glands are normal. Kidneys, ureters,
and bladder normal.

Stomach/Bowel: Stomach, small bowel, cecum are normal. The colon and
rectosigmoid colon are normal. There is a bowel anastomosis in the
central abdomen along the small bowel without obstruction (image 42,
series 2).

Vascular/Lymphatic: Abdominal aorta is normal caliber. There is no
retroperitoneal or periportal lymphadenopathy. No pelvic
lymphadenopathy.

Reproductive: Post hysterectomy anatomy.  No pelvic lymphadenopathy.

Musculoskeletal: No aggressive osseous lesion.

Other: No free fluid in the abdomen or pelvis.
IMPRESSION: 1. No evidence of bowel obstruction.
2. Bowel anastomosis in the mid small bowel without dilatation or
obstruction.
3. No acute findings in the abdomen or pelvis.
4. No ventral hernia.

## 2015-04-16 NOTE — Telephone Encounter (Signed)
Met with Dr. Casimiro Needle to inform patient called stating she does not have Klonopin prescription from 04/04/15 and needs a refill.  Attempted to reach patient and left message Dr. Casimiro Needle authorized this nurse to call in order from 04/04/15 to her [employer]  Called and spoke with Juliann Pulse, pharmacist at [facility] on [facility] who verified patient has not had any other Klonopin orders filled since February 90 day supply.  Gave new 90 day order for patient's Klonopin plus one refill per Dr. Casimiro Needle order.

## 2015-04-17 ENCOUNTER — Ambulatory Visit (INDEPENDENT_AMBULATORY_CARE_PROVIDER_SITE_OTHER): Payer: Medicare Other | Admitting: Internal Medicine

## 2015-04-17 ENCOUNTER — Encounter: Payer: Self-pay | Admitting: Internal Medicine

## 2015-04-17 VITALS — BP 137/65 | HR 66 | Temp 98.1°F | Ht 65.0 in | Wt 181.8 lb

## 2015-04-17 DIAGNOSIS — R1013 Epigastric pain: Secondary | ICD-10-CM | POA: Diagnosis not present

## 2015-04-17 DIAGNOSIS — M7711 Lateral epicondylitis, right elbow: Secondary | ICD-10-CM | POA: Diagnosis not present

## 2015-04-17 DIAGNOSIS — Z794 Long term (current) use of insulin: Secondary | ICD-10-CM | POA: Diagnosis not present

## 2015-04-17 DIAGNOSIS — IMO0002 Reserved for concepts with insufficient information to code with codable children: Secondary | ICD-10-CM

## 2015-04-17 DIAGNOSIS — E119 Type 2 diabetes mellitus without complications: Secondary | ICD-10-CM | POA: Diagnosis not present

## 2015-04-17 DIAGNOSIS — E1165 Type 2 diabetes mellitus with hyperglycemia: Secondary | ICD-10-CM

## 2015-04-17 LAB — GLUCOSE, CAPILLARY: GLUCOSE-CAPILLARY: 79 mg/dL (ref 65–99)

## 2015-04-17 LAB — POCT GLYCOSYLATED HEMOGLOBIN (HGB A1C): HEMOGLOBIN A1C: 6.1

## 2015-04-17 MED ORDER — SIMETHICONE 80 MG PO CHEW: 80.0000 mg | CHEWABLE_TABLET | Freq: Four times a day (QID) | ORAL | Status: AC | PRN

## 2015-04-17 MED ORDER — DICLOFENAC SODIUM 1 % TD GEL: 2.0000 g | Freq: Four times a day (QID) | TRANSDERMAL | Status: DC

## 2015-04-17 NOTE — Patient Instructions (Addendum)
1. Please schedule a follow up appointment for 3 months.   2. Please take all medications as previously prescribed with the following changes:  Someone will call you regarding physical therapy.  Use Voltaren gel on your elbow in order to improve inflammation.   Check your blood sugars daily. IF YOU HAVE FREQUENT BLOOD SUGARS BELOW 80 OR SYMPTOMS OF LOW BLOOD SUGAR, DECREASE YOUR LANTUS BY 5 UNITS   3. If you have worsening of your symptoms or new symptoms arise, please call the clinic [phone]), or go to the ER immediately if symptoms are severe.  You have done a great job in taking all your medications. Please continue to do this.

## 2015-04-17 NOTE — Progress Notes (Signed)
Subjective:   Patient ID: [name] female   DOB: [birth year] 64 y.o.   MRN: [id number]  HPI: [name] is a 64 y.o. female w/ PMHx of HTN, HLD, DM type II, GERD, CAD, chronic low back pain, depression, anxiety, and borderline personality disorder, presents to the clinic today for a follow-up visit. Today she is complaining of ongoing right elbow pain, something which has been bothering her for quite some time now. She describes increased pain w/ extension and flexion, but more w/ pronation and supination. She recently was seen by [facility] Linton Flemings) who felt this was most likely lateral epicondylitis, supported by her history and exam findings, gave patient an Rx for Voltaren gel which she has not used. No erythema, swelling, fever, chills, or paraesthesias.  [name] also complains of epigastric/periumbilical pain today. Per chart review, this seems to be more of a chronic problem for which she has seen [facility] (Buccini) as recently as 02/26/15. She takes a PPI, as well as Linzess since her last visit and apparently has taken Movantik in the past as well, for issues involving constipation. She states the pain is a crampy pain, usually worse w/ food, no recent change in her bowel habits (still intermittently constipated), nausea, or vomiting.    Current Outpatient Prescriptions  Medication Sig Dispense Refill  . ACCU-CHEK FASTCLIX LANCETS MISC TEST five times a day 102 each 11  . acetaminophen-codeine (TYLENOL #4) 300-60 MG per tablet   0  . albuterol (PROVENTIL HFA;VENTOLIN HFA) 108 (90 BASE) MCG/ACT inhaler Inhale 2 puffs into the lungs every 6 (six) hours as needed for wheezing or shortness of breath. 1 Inhaler 2  . aspirin 81 MG chewable tablet Chew 81 mg by mouth daily.    . BD PEN NEEDLE NANO U/F 32G X 4 MM MISC TEST UPTO 5 TIMES DAILY 100 each 4  . buPROPion (WELLBUTRIN XL) 150 MG 24 hr tablet Take 3 tablets (450 mg total) by mouth daily. 270 tablet 1  . clonazePAM  (KLONOPIN) 0.5 MG tablet Take 1 tablet (0.5 mg total) by mouth 3 (three) times daily. For anxiety 270 tablet 1  . cloNIDine (CATAPRES - DOSED IN MG/24 HR) 0.3 mg/24hr patch Place 1 patch (0.3 mg total) onto the skin every 7 (seven) days. 4 patch 11  . dexlansoprazole (DEXILANT) 60 MG capsule Take 1 capsule (60 mg total) by mouth daily. For reflux 30 capsule   . diclofenac sodium (VOLTAREN) 1 % GEL Apply 2 g topically 4 (four) times daily. 3 Tube 1  . Docusate Sodium (COLACE PO) Take 2 tablets by mouth daily as needed (for constipation).     Marland Kitchen glucose blood (ACCU-CHEK AVIVA PLUS) test strip Check blood sugar up to 4 times a day as instructed 125 each 5  . Insulin Glargine (LANTUS SOLOSTAR) 100 UNIT/ML Solostar Pen Inject 51 Units into the skin at bedtime. 45 mL 4  . isosorbide mononitrate (IMDUR) 60 MG 24 hr tablet Take 1 tablet (60 mg total) by mouth every morning. 90 tablet 1  . Linaclotide (LINZESS) 145 MCG CAPS capsule Take 145 mcg by mouth daily.    Marland Kitchen losartan (COZAAR) 50 MG tablet Take 1 tablet (50 mg total) by mouth daily. 90 tablet 4  . metoprolol succinate (TOPROL-XL) 25 MG 24 hr tablet Take 1 tablet (25 mg total) by mouth every morning. 90 tablet 4  . ondansetron (ZOFRAN) 4 MG tablet Take 4 mg by mouth every 8 (eight) hours as needed for nausea.     Marland Kitchen  rosuvastatin (CRESTOR) 10 MG tablet Take 1 tablet (10 mg total) by mouth at bedtime. 30 tablet 11  . senna (SENOKOT) 8.6 MG TABS Take 4 tablets by mouth daily as needed (for constipation).     . sertraline (ZOLOFT) 100 MG tablet Take 1 tablet (100 mg total) by mouth daily. 90 tablet 1  . simethicone (GAS-X) 80 MG chewable tablet Chew 1 tablet (80 mg total) by mouth 4 (four) times daily as needed for flatulence. 100 tablet 2  . sitaGLIPtin (JANUVIA) 50 MG tablet Take 1 tablet (50 mg total) by mouth daily. 90 tablet 0  . traZODone (DESYREL) 50 MG tablet Take 1 tablet (50 mg total) by mouth at bedtime. 90 tablet 1   No current  facility-administered medications for this visit.   Review of Systems  General: Denies fever, diaphoresis, appetite change, and fatigue.  Respiratory: Denies SOB, cough, and wheezing.   Cardiovascular: Denies chest pain and palpitations.  Gastrointestinal: Positive for abdominal pain. Denies nausea, vomiting, and diarrhea Musculoskeletal: Positive for right elbow pain. Denies back pain and gait problem.  Neurological: Denies dizziness, syncope, weakness, lightheadedness, and headaches.  Psychiatric/Behavioral: Denies mood changes, sleep disturbance, and agitation.   Objective:   Physical Exam: Filed Vitals:   04/17/15 0822  BP: 137/65  Pulse: 66  Temp: 98.1 F (36.7 C)  TempSrc: Oral  Height: 5\' 5"  (1.651 m)  Weight: 181 lb 12.8 oz (82.464 kg)  SpO2: 97%    General: AA female, alert, cooperative, NAD. HEENT: PERRL, EOMI. Moist mucus membranes Neck: Full range of motion without pain, supple, no lymphadenopathy or carotid bruits Lungs: Clear to ascultation bilaterally, normal work of respiration, no wheezes, rales, rhonchi Heart: RRR, no murmurs, gallops, or rubs Abdomen: Soft, mildly render in the epigastrium/periumbilical region, non-distended, BS + Extremities: No cyanosis, clubbing, or edema. Pain in the right elbow w/ extension/flexion, but mostly w/ supination/pronation. Pain w/ resistance of finger flexion. Mild tenderness over lateral forearm/elbow. ROM somewhat limited only d/t pain.  Neurologic: Alert & oriented X3, cranial nerves II-XII intact, strength grossly intact, sensation intact to light touch   Assessment & Plan:   Please see problem based assessment and plan.

## 2015-04-17 NOTE — Addendum Note (Signed)
Addended byCourtney Paris on: 04/17/2015 02:50 PM   Modules accepted: Level of Service

## 2015-04-17 NOTE — Assessment & Plan Note (Signed)
Complaining of epigastric/periumbilical pain today. Seems to be a chronic issue. Follows w/ Dr. Matthias Hughs. Supposed to see again soon. States she is to have EGD in August. Pain is crampy, sometimes worsened w/ food. No fever, chills, nausea, or vomiting. Has chronic constipation, recently started on Linzess. Still having constipation. Given h/o anxiety and depression, suspect patient has IBS-C which would explain both her pain and constipation. However, given her apparent change in weight, association of pain w/ food, and h/o GERD, also possibility this may be PUD. Only mildly tender centrally on exam. Good BS. Denies NSAID use. May also be having gas pain given her crampy pain that she describes.  -Continue Linzess, PPI -Will give Rx for Simethicone for gas -Patient to return to [facility] in next 4-8 weeks for further evaluation and possible EGD.

## 2015-04-17 NOTE — Assessment & Plan Note (Signed)
Patient w/ ongoing right elbow pain mostly w/ pronation and supination. Seen by Dr. Clydene Pugh some time ago, suggested lateral epicondylitis. Diagnosis supported by history and exam. Was given Rx for Voltaren gel, she says she did not know about this and has not used it yet. Still having significant elbow and forearm pain. No erythema, swelling, or paraesthesias. Distal pulse intact.  -Rx for Voltaren gel -Referral to PT for strengthening and further management.

## 2015-04-17 NOTE — Assessment & Plan Note (Signed)
Lab Results  Component Value Date   HGBA1C 6.1 04/17/2015   HGBA1C 7.8 01/08/2015   HGBA1C 9.3 10/23/2014     Assessment: Diabetes control:  Significantly improved Comments: Patient brought her meter today, CBG's b/w 75 and 170, average 115, this would clearly explain HbA1c of 6.1. Patient denies any symptoms of hypoglycemia, however, she has apparently not had symptoms w/ blood sugars as low as 50's in the past, at which time she was taken off of Novolog. She is currently on Lantus 51 units + Januvia 50 mg daily.  Plan: Medications:  continue current medications; Discussed control at length; if several CBG's <80, instructed patient to decrease Lantus by 5 units (decrease to 46 units) to avoid possible unknown episodes of hypoglycemia given that she does not have symptoms w/ significant lows.  Home glucose monitoring: Frequency:  qAM Instruction/counseling given: discussed diet Educational resources provided: brochure, handout Self management tools provided:   Other plans: RTC in 3 months. If HbA1c still well controlled, would consider significant decrease in Lantus dosing.

## 2015-04-18 ENCOUNTER — Ambulatory Visit (HOSPITAL_COMMUNITY)
Admission: RE | Admit: 2015-04-18 | Discharge: 2015-04-18 | Disposition: A | Discharge status: Home / Self Care | Payer: Medicare Other | Source: Ambulatory Visit | Attending: Internal Medicine | Admitting: Internal Medicine

## 2015-04-18 DIAGNOSIS — Z1231 Encounter for screening mammogram for malignant neoplasm of breast: Secondary | ICD-10-CM | POA: Insufficient documentation

## 2015-04-22 NOTE — Progress Notes (Signed)
Internal Medicine Clinic Attending  Case discussed with Dr. Jones soon after the resident saw the patient.  We reviewed the resident's history and exam and pertinent patient test results.  I agree with the assessment, diagnosis, and plan of care documented in the resident's note. 

## 2015-04-26 ENCOUNTER — Other Ambulatory Visit: Payer: Self-pay | Admitting: Physical Medicine & Rehabilitation

## 2015-04-29 NOTE — Telephone Encounter (Signed)
Refilled Tylenol # 4 per request from pharmacy.  Noted she received oxycodone 5/325 # 50 from Dr Dion SaucierLandau after documented left carpal tunnel release. No further fills have occurred.

## 2015-04-30 ENCOUNTER — Other Ambulatory Visit: Payer: Self-pay | Admitting: Physical Medicine & Rehabilitation

## 2015-05-05 DIAGNOSIS — G5602 Carpal tunnel syndrome, left upper limb: Secondary | ICD-10-CM | POA: Diagnosis not present

## 2015-05-05 DIAGNOSIS — G5601 Carpal tunnel syndrome, right upper limb: Secondary | ICD-10-CM | POA: Diagnosis not present

## 2015-05-07 ENCOUNTER — Encounter: Payer: Self-pay | Admitting: *Deleted

## 2015-05-07 ENCOUNTER — Ambulatory Visit: Payer: Medicare Other | Attending: Internal Medicine | Admitting: Physical Therapy

## 2015-05-07 DIAGNOSIS — M25531 Pain in right wrist: Secondary | ICD-10-CM | POA: Insufficient documentation

## 2015-05-07 DIAGNOSIS — R29898 Other symptoms and signs involving the musculoskeletal system: Secondary | ICD-10-CM | POA: Insufficient documentation

## 2015-05-07 DIAGNOSIS — M25532 Pain in left wrist: Secondary | ICD-10-CM | POA: Insufficient documentation

## 2015-05-07 DIAGNOSIS — M79642 Pain in left hand: Secondary | ICD-10-CM | POA: Insufficient documentation

## 2015-05-07 DIAGNOSIS — M25632 Stiffness of left wrist, not elsewhere classified: Secondary | ICD-10-CM | POA: Insufficient documentation

## 2015-05-07 DIAGNOSIS — M25621 Stiffness of right elbow, not elsewhere classified: Secondary | ICD-10-CM | POA: Insufficient documentation

## 2015-05-07 DIAGNOSIS — M25521 Pain in right elbow: Secondary | ICD-10-CM | POA: Insufficient documentation

## 2015-05-07 NOTE — Patient Instructions (Signed)
Pt instructed to bring Rx for bilat. CTS to Neuro[facility]hab site, she can be seen by OT for associated joint pain Advised her to continue stretches she has been doing to maintain mobility.

## 2015-05-07 NOTE — Therapy (Addendum)
[facility] [address] [place], [place], [address] Phone: [phone]   Fax:  [phone]  Physical Therapy Evaluation/DISCHARGE  Patient Details  Name: [name] MRN: [id number] Date of Birth: November 18, 1950 Referring Provider:  Courtney Paris, MD  Encounter Date: 05/07/2015      PT End of Session - 05/07/15 1315    Visit Number 1   Number of Visits 16   Date for PT Re-Evaluation 07/02/15   PT Start Time 1155   PT Stop Time 1246   PT Time Calculation (min) 51 min   Activity Tolerance Patient limited by pain   Behavior During Therapy [facility] for tasks assessed/performed      Past Medical History  Diagnosis Date  . Hypertension   . Peripheral neuropathy   . Cervicalgia   . Low back pain syndrome   . Hyperlipidemia   . Vitamin D deficiency   . Vitamin B12 deficiency   . Iliotibial band syndrome   . Anemia   . Borderline personality disorder   . Nonorganic psychosis   . GERD (gastroesophageal reflux disease)   . Diabetes mellitus   . Depression   . Diabetic gastroparesis   . CAD (coronary artery disease)     Catherization 11/18/09>nonobstructive CAD , normal LV function, EF 65%  . Health maintenance examination     Colonoscopy 2009> normal; Diabetic eye exam 12/2008. No diabetic retinopathy; Mammogram 11/10> No evidence of malignancy, DXA 03/14/13 : normal  . Headache(784.0)   . Arthritis   . SBO (small bowel obstruction) 01/09/2013  . Diastolic CHF     Grade I, on Echo 06/2013, EF 55-60%  . Sleep apnea     does not wear CPAP   . Diabetes mellitus, type II   . Wears glasses   . Wears dentures     upper  . Gout   . Right carpal tunnel syndrome 09/13/2014    Past Surgical History  Procedure Laterality Date  . Abdominal hysterectomy    . Hand surgery  1992    rt  . Bowel resection N/A 01/10/2013    Procedure: SMALL BOWEL RESECTION;  Surgeon: Lodema Pilot, DO;  Location: [facility] OR;  Service: General;  Laterality: N/A;   . Lysis of adhesion N/A 01/10/2013    Procedure: LYSIS OF ADHESION;  Surgeon: Lodema Pilot, DO;  Location: [facility] OR;  Service: General;  Laterality: N/A;  . Laparotomy N/A 01/10/2013    Procedure: EXPLORATORY LAPAROTOMY;  Surgeon: Lodema Pilot, DO;  Location: [facility] OR;  Service: General;  Laterality: N/A;  . Incisional hernia repair N/A 03/01/2014    Procedure: LAPAROSCOPIC INCISIONAL HERNIA;  Surgeon: Axel Filler, MD;  Location: [facility];  Service: General;  Laterality: N/A;  . Insertion of mesh N/A 03/01/2014    Procedure: INSERTION OF MESH;  Surgeon: Axel Filler, MD;  Location: [facility];  Service: General;  Laterality: N/A;  . Hernia repair  03/01/14    Lap incisional hernia repair w/mesh  . Appendectomy    . Carpal tunnel release Right 09/13/2014    Procedure: RIGHT WRIST CARPAL TUNNEL RELEASE;  Surgeon: Eulas Post, MD;  Location: [facility];  Service: Orthopedics;  Laterality: Right;  . Steriod injection Left 09/13/2014    Procedure: STEROID INJECTION LEFT HAND;  Surgeon: Eulas Post, MD;  Location: [facility];  Service: Orthopedics;  Laterality: Left;  . Carpal tunnel release Left 02/28/2015    Procedure: LEFT CARPAL TUNNEL RELEASE;  Surgeon: Teryl Lucy,  MD;  Location: [facility];  Service: Orthopedics;  Laterality: Left;    There were no vitals filed for this visit.  Visit Diagnosis:  Pain in elbow, right  Elbow stiffness, right  Pain in both wrists  Decreased grip strength      Subjective Assessment - 05/07/15 1200    Subjective [place] 6/7 Dr. Yetta Barre referred.  for elbow.  She complains of swelling and pain She was in PT months ago for her low back pain but did not complete.   Currently in Pain? Yes   Pain Score 8   with movement   Pain Location Elbow   Pain Orientation Right   Pain Type Chronic pain   Pain Onset More than a month ago  Mid May 2016   Pain Frequency Constant   Aggravating Factors  bending, lifting,  twisting wrist   Pain Relieving Factors meds (ointment), ice, keeping it still, wrapping it   Effect of Pain on Daily Activities diff driving, cooking and with ADLs   Multiple Pain Sites Yes   Pain Location Wrist   Pain Orientation Lateral   Pain Descriptors / Indicators Aching   Pain Type Chronic pain;Acute pain   Pain Onset More than a month ago   Pain Frequency Constant   Effect of Pain on Daily Activities see above            [facility] PT Assessment - 05/07/15 1206    Assessment   Medical Diagnosis lateral epicondylitits  also bilateral carpal tunnel release   Onset Date/Surgical Date 02/28/15   Hand Dominance Right   Precautions   Precautions None   Restrictions   Weight Bearing Restrictions No   Balance Screen   Has the patient fallen in the past 6 months No   How many times? careless   Has the patient had a decrease in activity level because of a fear of falling?  No   Is the patient reluctant to leave their home because of a fear of falling?  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   Leisure play with grandson   Cognition   Overall Cognitive Status Within Functional Limits for tasks assessed   Observation/Other Assessments-Edema    Edema Circumferential  10.25 inches bilateral   Sensation   Light Touch Appears Intact   Additional Comments improved L wrist since surgery   Coordination   Gross Motor Movements are Fluid and Coordinated Not tested   Posture/Postural Control   Posture Comments not  observed   AROM   Right Elbow Flexion 138   Right Elbow Extension 16  pain   Left Elbow Flexion 140   Left Elbow Extension 14   Right/Left Wrist --  grip Rt 10, 11, 13kg Lt 12, 11, 12 kg   Right Wrist Extension 52 Degrees  pain   Right Wrist Flexion 60 Degrees  pain/stretch   Left Wrist Extension 62 Degrees   Left Wrist Flexion 64 Degrees   Strength   Right Elbow Flexion 4+/5   Right  Elbow Extension 3/5   Left Elbow Flexion 4+/5   Left Elbow Extension 4/5   Right Wrist Flexion 3+/5   Right Wrist Extension 3+/5   Left Wrist Flexion 4/5   Left Wrist Extension 4/5   Palpation   Palpation comment tender/painful in wrist extensors but min at lateral epicondyle, pain also prox to elbow, biceps tender.  L wrist tender in incision and Rt. dorsal surface of thumb    Pt has intense pain with combined supination and extension (elbow) Rt. Forearm, 10/10          PT Education - 2015-06-03 1314    Education provided Yes   Education Details PT, POC, OT vs PT and ice vs heat, IFC, maintaining mobility   Person(s) Educated Patient   Methods Explanation;Demonstration   Comprehension Verbalized understanding;Returned demonstration          PT Short Term Goals - 03-Jun-2015 1327    PT SHORT TERM GOAL #1   Title Goals to be set by OT               Plan - 2015-06-03 1316    Clinical Impression Statement Patient reports with 2 referrals from 2 separate MDs.  I feel she would be better served with an OT to address these related functional deficits.  She likely began to overuse her Rt. elbow after her Lt wrist surgery.  She presents with pain, decreased strength and flexibility in Rt. elbow, bilat.  wrist .    She will benefit from skilled PT to improve functional use of bilateral UEs  to allow her more comfort at rest and with home activities.    Pt will benefit from skilled therapeutic intervention in order to improve on the following deficits Increased fascial restricitons;Impaired UE  functional use;Pain;Decreased activity tolerance;Decreased scar mobility;Impaired flexibility;Increased edema;Decreased strength   Rehab Potential Good   PT Frequency 2x / week   PT Duration 8 weeks   PT Treatment/Interventions ADLs/Self Care Home Management;Iontophoresis 4mg /ml Dexamethasone;Ultrasound;Cryotherapy;Electrical Stimulation;Manual techniques;Taping;Scar mobilization;Therapeutic activities;Therapeutic exercise;Patient/family education;Passive range of motion   PT Next Visit Plan establish HEP and re-assess as needed, pt did not report a significant improvement with modalities today (No ICE!)   PT Home Exercise Plan AROM wrist and elbow   Recommended Other Services OT    Consulted and Agree with Plan of Care Patient          G-Codes - 03-Jun-2015 1330    Functional Assessment Tool Used clinical judgement   Functional Limitation Mobility: Walking and moving around;Other PT primary   Mobility: Walking and Moving Around Current Status [phone]) At least 40 percent but less than 60 percent impaired, limited or restricted   Mobility: Walking and Moving Around Goal Status [phone]) At least 40 percent but less than 60 percent impaired, limited or restricted   Mobility: Walking and Moving Around Discharge Status [phone]) At least 40 percent but less than 60 percent impaired, limited or restricted   Other PT Primary Current Status (B1478) At least 60 percent but less than 80 percent impaired, limited or restricted   Other PT Primary Goal Status (G9562) At least 40 percent but less than 60 percent impaired, limited or restricted       Problem List Patient Active Problem List   Diagnosis Date Noted  . Lateral epicondylitis of right elbow 04/17/2015  . Degenerative lumbar disc 12/30/2014  . Cough   . Major depressive disorder, recurrent severe without psychotic features   . Right carpal tunnel syndrome 09/13/2014  .  Degenerative joint disease involving multiple joints on both sides of body  (right foot pain) 08/14/2014  . TMJ syndrome 06/18/2014  . Chronic low back pain 04/15/2014  . Periumbilical hernia 02/02/2014  . Hot flashes 01/02/2014  . Diastolic CHF 07/11/2013  . Fibromyalgia syndrome 06/18/2013  . Abdominal pain, epigastric 03/29/2013  . Myalgia 12/21/2012  . Chronic chest pain 11/21/2012  . Carpal tunnel syndrome, bilateral 07/25/2012  . Tension headache, chronic 05/24/2012  . Iron deficiency anemia 09/22/2011  . Depression 07/14/2011  . Anxiety 06/02/2011  . Acute renal failure superimposed on stage 3 chronic kidney disease 01/22/2011  . Lumbar spondylosis with myelopathy 11/20/2010  . Coronary atherosclerosis 03/10/2010  . Dyslipidemia 12/11/2008  . VITAMIN D DEFICIENCY 11/26/2008  . Uncontrolled type 2 diabetes mellitus with insulin therapy 05/25/2007  . PERIPHERAL NEUROPATHY 05/25/2007  . Essential hypertension 05/25/2007  . GERD 05/25/2007  . SYMPTOM, APNEA, SLEEP NOS 05/25/2007    Faisal Stradling 05/07/2015, 1:37 PM  [facility] [address] [place], [place], [address] Phone: [phone]   Fax:  [phone]  Karie MainlandJennifer Yahayra Geis, PT 05/07/2015 1:37 PM Phone: [phone] Fax: [phone]   Addendum: Patient will NOT be seen for PT but will instead need a referral for the patient to be seen by OT for both diagnoses.  This is in process and will contact patient to schedule.    Karie MainlandJennifer Vanisha Whiten, PT 05/09/2015 1:05 PM Phone: [phone] Fax: [phone]

## 2015-05-09 DIAGNOSIS — R110 Nausea: Secondary | ICD-10-CM | POA: Diagnosis not present

## 2015-05-09 DIAGNOSIS — R634 Abnormal weight loss: Secondary | ICD-10-CM | POA: Diagnosis not present

## 2015-05-09 DIAGNOSIS — K5901 Slow transit constipation: Secondary | ICD-10-CM | POA: Diagnosis not present

## 2015-05-09 DIAGNOSIS — R6881 Early satiety: Secondary | ICD-10-CM | POA: Diagnosis not present

## 2015-05-13 ENCOUNTER — Encounter: Payer: Self-pay | Admitting: Occupational Therapy

## 2015-05-13 ENCOUNTER — Ambulatory Visit: Payer: Medicare Other | Admitting: Occupational Therapy

## 2015-05-13 DIAGNOSIS — M25632 Stiffness of left wrist, not elsewhere classified: Secondary | ICD-10-CM

## 2015-05-13 DIAGNOSIS — M25521 Pain in right elbow: Secondary | ICD-10-CM | POA: Diagnosis not present

## 2015-05-13 DIAGNOSIS — M25621 Stiffness of right elbow, not elsewhere classified: Secondary | ICD-10-CM

## 2015-05-13 DIAGNOSIS — M25532 Pain in left wrist: Secondary | ICD-10-CM | POA: Diagnosis not present

## 2015-05-13 DIAGNOSIS — R29898 Other symptoms and signs involving the musculoskeletal system: Secondary | ICD-10-CM

## 2015-05-13 DIAGNOSIS — M25531 Pain in right wrist: Secondary | ICD-10-CM | POA: Diagnosis not present

## 2015-05-13 DIAGNOSIS — M79642 Pain in left hand: Secondary | ICD-10-CM | POA: Diagnosis not present

## 2015-05-13 NOTE — Therapy (Signed)
[facility] [address] [place], [place], [address] Phone: [phone]   Fax:  [phone]  Occupational Therapy Evaluation  Patient Details  Name: [name] MRN: [id number] Date of Birth: 1950/11/10 Referring Provider:  Courtney Paris, MD  Encounter Date: 05/13/2015      OT End of Session - 05/13/15 2046    Visit Number 1   Number of Visits 16   Date for OT Re-Evaluation 07/12/15   Authorization Type UHC Medicare/Medicaid, G-code needed   Authorization Time Period 05/13/15-07/12/15   Authorization - Visit Number 1   Authorization - Number of Visits 10   OT Start Time 0850   OT Stop Time 0930   OT Time Calculation (min) 40 min   Activity Tolerance Patient limited by pain   Behavior During Therapy [facility] for tasks assessed/performed      Past Medical History  Diagnosis Date  . Hypertension   . Peripheral neuropathy   . Cervicalgia   . Low back pain syndrome   . Hyperlipidemia   . Vitamin D deficiency   . Vitamin B12 deficiency   . Iliotibial band syndrome   . Anemia   . Borderline personality disorder   . Nonorganic psychosis   . GERD (gastroesophageal reflux disease)   . Diabetes mellitus   . Depression   . Diabetic gastroparesis   . CAD (coronary artery disease)     Catherization 11/18/09>nonobstructive CAD , normal LV function, EF 65%  . Health maintenance examination     Colonoscopy 2009> normal; Diabetic eye exam 12/2008. No diabetic retinopathy; Mammogram 11/10> No evidence of malignancy, DXA 03/14/13 : normal  . Headache(784.0)   . Arthritis   . SBO (small bowel obstruction) 01/09/2013  . Diastolic CHF     Grade I, on Echo 06/2013, EF 55-60%  . Sleep apnea     does not wear CPAP   . Diabetes mellitus, type II   . Wears glasses   . Wears dentures     upper  . Gout   . Right carpal tunnel syndrome 09/13/2014    Past Surgical History  Procedure Laterality Date  . Abdominal hysterectomy    .  Hand surgery  1992    rt  . Bowel resection N/A 01/10/2013    Procedure: SMALL BOWEL RESECTION;  Surgeon: Lodema Pilot, DO;  [facility];  Service: General;  Laterality: N/A;  . Lysis of adhesion N/A 01/10/2013    Procedure: LYSIS OF ADHESION;  Surgeon: Lodema Pilot, DO;  Location: [facility];  Service: General;  Laterality: N/A;  . Laparotomy N/A 01/10/2013    Procedure: EXPLORATORY LAPAROTOMY;  Surgeon: Lodema Pilot, DO;  Location: [facility];  Service: General;  Laterality: N/A;  . Incisional hernia repair N/A 03/01/2014    Procedure: LAPAROSCOPIC INCISIONAL HERNIA;  Surgeon: Axel Filler, MD;  Location: [facility];  Service: General;  Laterality: N/A;  . Insertion of mesh N/A 03/01/2014    Procedure: INSERTION OF MESH;  Surgeon: Axel Filler, MD;  Location: [facility];  Service: General;  Laterality: N/A;  . Hernia repair  03/01/14    Lap incisional hernia repair w/mesh  . Appendectomy    . Carpal tunnel release Right 09/13/2014    Procedure: RIGHT WRIST CARPAL TUNNEL RELEASE;  Surgeon: Eulas Post, MD;  Location: [facility];  Service: Orthopedics;  Laterality: Right;  . Steriod injection Left 09/13/2014    Procedure: STEROID INJECTION LEFT HAND;  Surgeon: Eulas Post, MD;  Location: [facility];  Service: Orthopedics;  Laterality: Left;  . Carpal tunnel release Left 02/28/2015    Procedure: LEFT CARPAL TUNNEL RELEASE;  Surgeon: Teryl Lucy, MD;  Location: [facility];  Service: Orthopedics;  Laterality: Left;    There were no vitals filed for this visit.  Visit Diagnosis:  Right elbow pain - Plan: CANCELED: Ot plan of care cert/re-cert, CANCELED: Ot plan of care cert/re-cert  Bilateral arm weakness - Plan: CANCELED: Ot plan of care cert/re-cert, CANCELED: Ot plan of care cert/re-cert  Joint stiffness of elbow, right - Plan: CANCELED: Ot plan of care cert/re-cert, CANCELED: Ot plan of care cert/re-cert  Stiffness of left wrist joint - Plan:  CANCELED: Ot plan of care cert/re-cert, CANCELED: Ot plan of care cert/re-cert      Subjective Assessment - 05/13/15 0903    Subjective  I hurt all the time   Pertinent History R CTR 09/14/14, L CTR 02/28/15, R elbow pain begain after L CTR    Repetition Increases Symptoms   Patient Stated Goals improve pain and hand use   Currently in Pain? Yes   Pain Score --  3-10/10   Pain Location Elbow   Pain Descriptors / Indicators Sharp   Pain Onset More than a month ago   Pain Frequency Constant   Aggravating Factors  moving arm/wrist   Pain Relieving Factors rest, Voltaren gel   Multiple Pain Sites Yes   Pain Score 7  2-9/10   Pain Location Wrist   Pain Orientation Right;Left;Lateral  radially   Pain Descriptors / Indicators Sharp;Constant   Pain Onset More than a month ago   Aggravating Factors  movement   Pain Relieving Factors rest           [facility] OT Assessment - 05/13/15 0001    Assessment   Diagnosis bilateral CTR, R lateral epicondylitis   Onset Date 09/14/14  R CTR, 02/28/15 L CTR with R epicondylitis after L CTR   Prior Therapy no ongoing   Precautions   Precautions None   Balance Screen   Has the patient fallen in the past 6 months No   Home  Environment   Family/patient expects to be discharged to: Private residence   Lives With Spouse   Prior Function   Level of Independence Independent with basic ADLs;Independent with homemaking with ambulation   Vocation On disability   Vocation Requirements 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     Mobility   Mobility Status Independent   Written Expression   Dominant Hand Right   Handwriting 100% legible  but with pain   Cognition   Overall Cognitive Status --   Observation/Other Assessments   Other Surveys  Select   Quick  DASH  57.5%   Sensation   Light Touch Appears Intact   Coordination   9 Hole Peg Test Right;Left   Right 9 Hole Peg Test 27.72   Left 9 Hole Peg Test 28.78   Edema   Edema mild noted at R proximal elbow/dorsal forearm    ROM / Strength   AROM / PROM / Strength AROM;Strength   Palpation   Palpation comment tender/painful in R wrist extensors, L hand tender/painful at incision site   AROM  Overall AROM Comments pain appears to be primary factor limiting AROM and strength, finger ROM WNL except unable to oppose to base of 5th digits bilaterally   Right Elbow Flexion --  WNL   Right Elbow Extension --  -20   Left Elbow Flexion --  WNL   Left Elbow Extension --  WNL   Right Wrist Extension 50 Degrees   Right Wrist Flexion --  WNL   Left Wrist Extension 30 Degrees   Left Wrist Flexion --  WNL   Strength   Right Elbow Flexion 4+/5   Right Elbow Extension 3/5   Left Elbow Flexion --   Left Elbow Extension --   Right Wrist Flexion 3+/5   Right Wrist Extension 3+/5   Left Wrist Flexion 4/5   Left Wrist Extension 3+/5   Hand Function   Right Hand Grip (lbs) 32   Left Hand Grip (lbs) 33                           OT Short Term Goals - 05/13/15 2054    OT SHORT TERM GOAL #1   Title Pt will be independent with initial HEP.--check STGs 06/12/15   Time 4   Period Weeks   Status New   OT SHORT TERM GOAL #2   Title Pt will report pain less than or equal to 5/10 in R elbow and L wrist/hand.   Time 4   Period Weeks   Status New   OT SHORT TERM GOAL #3   Title Pt will be independent with splint wear/care prn.   Time 4   Period Weeks   Status New   OT SHORT TERM GOAL #4   Title Pt will verbalize understanding of AE/adaptive strategies for ADLs to incr ease/reduce pain prn.   Time 4   Period Weeks   Status New   OT SHORT TERM GOAL #5   Title Pt will improve L wrist extension to at least 50 degrees for ADLs.   Baseline 30   Time 4   Period Weeks    Status New           OT Long Term Goals - 05/13/15 2057    OT LONG TERM GOAL #1   Title Pt will be independent with updated HEP.--check LTGs 07/12/15   Time 8   Period Weeks   Status New   OT LONG TERM GOAL #2   Title Pt will report pain less than or equal to 5/10 in R elbow and L wrist/hand.   Time 8   Period Weeks   Status New   OT LONG TERM GOAL #3   Title Pt will improve UE functional use as shown by improving score on quick DASH to 35% or less.   Baseline 57.5%   Time 8   Period Weeks   Status New   OT LONG TERM GOAL #4   Title Pt will improve bilateral grip strength by at least 5lbs for ADLs.   Baseline R-32lbs, L-33lbs   Time 8   Period Weeks   Status New               Plan - 05/13/15 2048    Clinical Impression Statement Pt diagnosed with R lateral epicondylitis that began with L CTR 02/28/15.  Pt also s/p R CTR 09/14/14.  Pt presents with pain, particularly in L hand and R elbow, decreased ROM, decreased strength, decreased UE functional use and  ADL/IADL performance.  Pt would benefit from occupational therapy to improve pain and UE functional use for ADLs.   Pt will benefit from skilled therapeutic intervention in order to improve on the following deficits (Retired) Decreased strength;Pain;Impaired UE functional use;Decreased scar mobility;Decreased knowledge of use of DME;Increased edema;Decreased range of motion;Decreased coordination   Rehab Potential Good   OT Frequency 2x / week   OT Duration 8 weeks  +eval   OT Treatment/Interventions Self-care/ADL training;Moist Heat;Fluidtherapy;DME and/or AE instruction;Splinting;Patient/family education;Therapeutic exercises;Contrast Bath;Ultrasound;Therapeutic exercise;Scar mobilization;Therapeutic activities;Passive range of motion;Neuromuscular education;Cryotherapy;Electrical Stimulation;Parrafin;Manual Therapy   Plan splinting for R wrist/counterforce band, initiate HEP   Consulted and Agree with Plan of Care  Patient          G-Codes - 05/21/2015 02-Mar-2100    Functional Assessment Tool Used Quick DASH 57.5%   Functional Limitation Carrying, moving and handling objects   Carrying, Moving and Handling Objects Current Status [phone]) At least 40 percent but less than 60 percent impaired, limited or restricted   Carrying, Moving and Handling Objects Goal Status (U0454) At least 20 percent but less than 40 percent impaired, limited or restricted      Problem List Patient Active Problem List   Diagnosis Date Noted  . Lateral epicondylitis of right elbow 04/17/2015  . Degenerative lumbar disc 12/30/2014  . Cough   . Major depressive disorder, recurrent severe without psychotic features   . Right carpal tunnel syndrome 09/13/2014  . Degenerative joint disease involving multiple joints on both sides of body (right foot pain) 08/14/2014  . TMJ syndrome 06/18/2014  . Chronic low back pain 04/15/2014  . Periumbilical hernia 02/02/2014  . Hot flashes 01/02/2014  . Diastolic CHF 07/11/2013  . Fibromyalgia syndrome 06/18/2013  . Abdominal pain, epigastric 03/29/2013  . Myalgia 12/21/2012  . Chronic chest pain 11/21/2012  . Carpal tunnel syndrome, bilateral 07/25/2012  . Tension headache, chronic 05/24/2012  . Iron deficiency anemia 09/22/2011  . Depression 07/14/2011  . Anxiety 06/02/2011  . Acute renal failure superimposed on stage 3 chronic kidney disease 01/22/2011  . Lumbar spondylosis with myelopathy 11/20/2010  . Coronary atherosclerosis 03/10/2010  . Dyslipidemia 12/11/2008  . VITAMIN D DEFICIENCY 11/26/2008  . Uncontrolled type 2 diabetes mellitus with insulin therapy 05/25/2007  . PERIPHERAL NEUROPATHY 05/25/2007  . Essential hypertension 05/25/2007  . GERD 05/25/2007  . SYMPTOM, APNEA, SLEEP NOS 05/25/2007    [facility] May 21, 2015, 9:09 PM  [facility] [address] [place], [place], [address] Phone: [phone]    Fax:  [phone]    Willa Frater, OTR/L 2015/05/21 9:09 PM

## 2015-05-19 ENCOUNTER — Ambulatory Visit: Payer: Medicare Other | Admitting: Occupational Therapy

## 2015-05-19 ENCOUNTER — Encounter: Payer: Self-pay | Admitting: Occupational Therapy

## 2015-05-19 DIAGNOSIS — M25521 Pain in right elbow: Secondary | ICD-10-CM | POA: Diagnosis not present

## 2015-05-19 DIAGNOSIS — M25632 Stiffness of left wrist, not elsewhere classified: Secondary | ICD-10-CM | POA: Diagnosis not present

## 2015-05-19 DIAGNOSIS — M25621 Stiffness of right elbow, not elsewhere classified: Secondary | ICD-10-CM

## 2015-05-19 DIAGNOSIS — M79642 Pain in left hand: Secondary | ICD-10-CM | POA: Diagnosis not present

## 2015-05-19 DIAGNOSIS — M25531 Pain in right wrist: Secondary | ICD-10-CM | POA: Diagnosis not present

## 2015-05-19 DIAGNOSIS — R29898 Other symptoms and signs involving the musculoskeletal system: Secondary | ICD-10-CM

## 2015-05-19 DIAGNOSIS — M25532 Pain in left wrist: Secondary | ICD-10-CM | POA: Diagnosis not present

## 2015-05-19 NOTE — Patient Instructions (Addendum)
1. Flexor Tendon Gliding (Active Hook Fist)-- Do #1-3 in order then straighten fingers fully.  10x, 2x/day   With fingers and knuckles straight, bend middle and tip joints. Do not bend large knuckles. Repeat 10 times. Do 2 sessions per day.   2. Flexor Tendon Gliding (Active Full Fist)   Straighten all fingers, then make a fist, bending all joints. Repeat 10 times. Do 2sessions per day.   3. Flexor Tendon Gliding (Active Straight Fist)   Start with fingers straight. Bend knuckles and middle joints. Keep fingertip joints straight to touch base of palm. Repeat 10 times. Do 2sessions per day.       MP Flexion (Active Isolated)   Bend ALL fingers at large knuckle, keeping other fingers straight. Do not bend tips. Repeat 10 times. Do 2 sessions per day.    5.  Perform scar massage on left palm x48min, 2x/day

## 2015-05-19 NOTE — Therapy (Signed)
[facility] [address] [place], [place], [address] Phone: [phone]   Fax:  [phone]  Occupational Therapy Treatment  Patient Details  Name: [name] MRN: [id number] Date of Birth: 1951/08/28 Referring Provider:  Denton Brick, MD  Encounter Date: 05/19/2015      OT End of Session - 05/19/15 1710    Visit Number 2   Number of Visits 16   Date for OT Re-Evaluation 07/12/15   Authorization Type UHC Medicare/Medicaid, G-code needed   Authorization Time Period 05/13/15-07/12/15   Authorization - Visit Number 2   Authorization - Number of Visits 10   OT Start Time 0933   OT Stop Time 1015   OT Time Calculation (min) 42 min   Activity Tolerance Patient limited by pain   Behavior During Therapy [facility] for tasks assessed/performed      Past Medical History  Diagnosis Date  . Hypertension   . Peripheral neuropathy   . Cervicalgia   . Low back pain syndrome   . Hyperlipidemia   . Vitamin D deficiency   . Vitamin B12 deficiency   . Iliotibial band syndrome   . Anemia   . Borderline personality disorder   . Nonorganic psychosis   . GERD (gastroesophageal reflux disease)   . Diabetes mellitus   . Depression   . Diabetic gastroparesis   . CAD (coronary artery disease)     Catherization 11/18/09>nonobstructive CAD , normal LV function, EF 65%  . Health maintenance examination     Colonoscopy 2009> normal; Diabetic eye exam 12/2008. No diabetic retinopathy; Mammogram 11/10> No evidence of malignancy, DXA 03/14/13 : normal  . Headache(784.0)   . Arthritis   . SBO (small bowel obstruction) 01/09/2013  . Diastolic CHF     Grade I, on Echo 06/2013, EF 55-60%  . Sleep apnea     does not wear CPAP   . Diabetes mellitus, type II   . Wears glasses   . Wears dentures     upper  . Gout   . Right carpal tunnel syndrome 09/13/2014    Past Surgical History  Procedure Laterality Date  . Abdominal hysterectomy     . Hand surgery  1992    rt  . Bowel resection N/A 01/10/2013    Procedure: SMALL BOWEL RESECTION;  Surgeon: Lodema Pilot, DO;  Location: [facility];  Service: General;  Laterality: N/A;  . Lysis of adhesion N/A 01/10/2013    Procedure: LYSIS OF ADHESION;  Surgeon: Lodema Pilot, DO;  Location: [facility];  Service: General;  Laterality: N/A;  . Laparotomy N/A 01/10/2013    Procedure: EXPLORATORY LAPAROTOMY;  Surgeon: Lodema Pilot, DO;  Location: [facility];  Service: General;  Laterality: N/A;  . Incisional hernia repair N/A 03/01/2014    Procedure: LAPAROSCOPIC INCISIONAL HERNIA;  Surgeon: Axel Filler, MD;  [facility]: [facility];  Service: General;  Laterality: N/A;  . Insertion of mesh N/A 03/01/2014    Procedure: INSERTION OF MESH;  Surgeon: Axel Filler, MD;  [facility]: [facility];  Service: General;  Laterality: N/A;  . Hernia repair  03/01/14    Lap incisional hernia repair w/mesh  . Appendectomy    . Carpal tunnel release Right 09/13/2014    Procedure: RIGHT WRIST CARPAL TUNNEL RELEASE;  Surgeon: Eulas Post, MD;  Location:  SUR[facility]ERY [facility];  Service: Orthopedics;  Laterality: Right;  . Steriod injection Left 09/13/2014    Procedure: STEROID INJECTION LEFT HAND;  Surgeon: Eulas Post, MD;  Holiday [phone] SurVeterinary surgeonenteriVerlon [phone] LovelessTEXTTAG>eless ACentral New York Eye Center LtdVeterinary surgeonHoliday repr al Center - EastENT>tur Hospital1B UREMENT>etann Love 960Verlon AuVeterinary surgeonMclaren MacombDarci [phone] representativeWashingtonPacific Ambulatory [facility] Judi Saa [facility] Margaretann [phone] AuVeterinary surgeonConstitution Surgery Center East LLCDarci [phone] representativeWashingtonMemorial [facility] Judi Saa [facility] Margaretann [phone] AuVeterinary surgeonThree Rivers HospitalDarci [phone] representativeWashingtonEast Bay Endoscopy Center Judi Saa  OT LONG TERM GOAL #3   Title Pt will improve UE functional use as shown by improving score on quick DASH to 35% or less.   Baseline 57.5%   Time 8   Period Weeks   Status New   OT LONG TERM GOAL #4   Title Pt will improve bilateral grip strength by at least 5lbs for ADLs.   Baseline R-32lbs, L-33lbs   Time 8   Period Weeks   Status New               Plan - 05/19/15 1710    Clinical Impression Statement Pt reports decr pain after heat, but continues to be sensitive to touch L hand and R forearm with movement limited by pain.   Plan check pt's current R wrist splint and update prn, counterforce band R elbow, review/add to HEP prn, modalities   OT Home Exercise Plan issued:  05/19/15--HEP for RUE (wrist flex with elbow bent, heat, massage), LUE (tendon glides, scar massage)   Consulted and Agree with Plan of Care Patient        Problem List Patient Active Problem List   Diagnosis Date Noted  . Lateral epicondylitis of right elbow 04/17/2015  . Degenerative lumbar disc 12/30/2014  . Cough   . Major depressive disorder, recurrent severe without psychotic features   . Right carpal tunnel syndrome 09/13/2014  . Degenerative joint disease involving multiple joints on both sides of body (right foot pain) 08/14/2014  . TMJ syndrome 06/18/2014  . Chronic low back pain 04/15/2014  . Periumbilical hernia 02/02/2014  . Hot flashes 01/02/2014  . Diastolic CHF 07/11/2013  . Fibromyalgia syndrome 06/18/2013  . Abdominal pain, epigastric 03/29/2013  . Myalgia 12/21/2012  . Chronic chest pain 11/21/2012  . Carpal tunnel syndrome, bilateral 07/25/2012  . Tension headache, chronic 05/24/2012  . Iron deficiency anemia 09/22/2011  . Depression 07/14/2011  . Anxiety 06/02/2011  . Acute renal failure superimposed on  stage 3 chronic kidney disease 01/22/2011  . Lumbar spondylosis with myelopathy 11/20/2010  . Coronary atherosclerosis 03/10/2010  . Dyslipidemia 12/11/2008  . VITAMIN D DEFICIENCY 11/26/2008  . Uncontrolled type 2 diabetes mellitus with insulin therapy 05/25/2007  . PERIPHERAL NEUROPATHY 05/25/2007  . Essential hypertension 05/25/2007  . GERD 05/25/2007  . SYMPTOM, APNEA, SLEEP NOS 05/25/2007    [facility] 05/19/2015, 5:14 PM  [facility] [address] [place], [place], [address] Phone: [phone]   Fax:  [phone]   Willa Frater, OTR/L 05/19/2015 5:14 PM

## 2015-05-21 ENCOUNTER — Ambulatory Visit: Payer: Medicare Other | Admitting: Occupational Therapy

## 2015-05-21 DIAGNOSIS — M25531 Pain in right wrist: Secondary | ICD-10-CM | POA: Diagnosis not present

## 2015-05-21 DIAGNOSIS — M25532 Pain in left wrist: Secondary | ICD-10-CM | POA: Diagnosis not present

## 2015-05-21 DIAGNOSIS — M25621 Stiffness of right elbow, not elsewhere classified: Secondary | ICD-10-CM | POA: Diagnosis not present

## 2015-05-21 DIAGNOSIS — R29898 Other symptoms and signs involving the musculoskeletal system: Secondary | ICD-10-CM | POA: Diagnosis not present

## 2015-05-21 DIAGNOSIS — M79642 Pain in left hand: Secondary | ICD-10-CM

## 2015-05-21 DIAGNOSIS — M25632 Stiffness of left wrist, not elsewhere classified: Secondary | ICD-10-CM

## 2015-05-21 DIAGNOSIS — M25521 Pain in right elbow: Secondary | ICD-10-CM | POA: Diagnosis not present

## 2015-05-21 NOTE — Therapy (Signed)
[facility] Health [facility] Rehabilitation [facility] [address] [place], [place], [address] Phone: [phone]   Fax:  [phone]  Occupational Therapy Treatment  Patient Details  Name: [name] MRN: [id number] Date of Birth: 01/08/1951 Referring Provider:  Denton Brick, MD  Encounter Date: 05/21/2015      OT End of Session - 05/21/15 1032    Visit Number 3   Number of Visits 16   Date for OT Re-Evaluation 07/12/15   Authorization Type UHC Medicare/Medicaid, G-code needed   Authorization Time Period 05/13/15-07/12/15   Authorization - Visit Number 3   Authorization - Number of Visits 10   OT Start Time 0930   OT Stop Time 1025   OT Time Calculation (min) 55 min   Activity Tolerance Patient limited by pain      Past Medical History  Diagnosis Date  . Hypertension   . Peripheral neuropathy   . Cervicalgia   . Low back pain syndrome   . Hyperlipidemia   . Vitamin D deficiency   . Vitamin B12 deficiency   . Iliotibial band syndrome   . Anemia   . Borderline personality disorder   . Nonorganic psychosis   . GERD (gastroesophageal reflux disease)   . Diabetes mellitus   . Depression   . Diabetic gastroparesis   . CAD (coronary artery disease)     Catherization 11/18/09>nonobstructive CAD , normal LV function, EF 65%  . Health maintenance examination     Colonoscopy 2009> normal; Diabetic eye exam 12/2008. No diabetic retinopathy; Mammogram 11/10> No evidence of malignancy, DXA 03/14/13 : normal  . Headache(784.0)   . Arthritis   . SBO (small bowel obstruction) 01/09/2013  . Diastolic CHF     Grade I, on Echo 06/2013, EF 55-60%  . Sleep apnea     does not wear CPAP   . Diabetes mellitus, type II   . Wears glasses   . Wears dentures     upper  . Gout   . Right carpal tunnel syndrome 09/13/2014    Past Surgical History  Procedure Laterality Date  . Abdominal hysterectomy    . Hand surgery  1992    rt  . Bowel resection N/A  01/10/2013    Procedure: SMALL BOWEL RESECTION;  Surgeon: Lodema Pilot, DO;  Location: [facility];  Service: General;  Laterality: N/A;  . Lysis of adhesion N/A 01/10/2013    Procedure: LYSIS OF ADHESION;  Surgeon: Lodema Pilot, DO;  Location: [facility] OR;  Service: General;  Laterality: N/A;  . Laparotomy N/A 01/10/2013    Procedure: EXPLORATORY LAPAROTOMY;  Surgeon: Lodema Pilot, DO;  Location: [facility];  Service: General;  Laterality: N/A;  . Incisional hernia repair N/A 03/01/2014    Procedure: LAPAROSCOPIC INCISIONAL HERNIA;  Surgeon: Axel Filler, MD;  Location: [facility];  Service: General;  Laterality: N/A;  . Insertion of mesh N/A 03/01/2014    Procedure: INSERTION OF MESH;  Surgeon: Axel Filler, MD;  Location: [facility];  Service: General;  Laterality: N/A;  . Hernia repair  03/01/14    Lap incisional hernia repair w/mesh  . Appendectomy    . Carpal tunnel release Right 09/13/2014    Procedure: RIGHT WRIST CARPAL TUNNEL RELEASE;  Surgeon: Eulas Post, MD;  Location: [facility];  Service: Orthopedics;  Laterality: Right;  . Steriod injection Left 09/13/2014    Procedure: STEROID INJECTION LEFT HAND;  Surgeon: Eulas Post, MD;  Location: [facility];  Service: Orthopedics;  Laterality: Left;  . Carpal tunnel release Left 02/28/2015    Procedure: LEFT CARPAL TUNNEL RELEASE;  Surgeon: Teryl Lucy, MD;  Location: [facility];  Service: Orthopedics;  Laterality: Left;    There were no vitals filed for this visit.  Visit Diagnosis:  Joint stiffness of elbow, right  Stiffness of left wrist joint  Right elbow pain  Pain of left hand      Subjective Assessment - 05/21/15 0936    Pertinent History R CTR 09/14/14, L CTR 02/28/15, R elbow pain begain after L CTR    Repetition Increases Symptoms   Patient Stated Goals improve pain and hand use   Currently in Pain? Yes   Pain Score --  0-8/10 with exercise   Pain Location Elbow   Pain Orientation Right    Pain Descriptors / Indicators Aching   Pain Type Chronic pain   Pain Onset More than a month ago   Pain Frequency Constant   Aggravating Factors  moving, use   Pain Relieving Factors rest, voltaren gel   Effect of Pain on Daily Activities driving, cooking, and with ADLS   Multiple Pain Sites Yes   Pain Score 2   Pain Location Hand   Pain Orientation Left   Pain Descriptors / Indicators Aching   Pain Type Acute pain;Chronic pain   Pain Onset More than a month ago   Pain Frequency Intermittent   Aggravating Factors  movement, use   Pain Relieving Factors rest   Effect of Pain on Daily Activities see above                      OT Treatments/Exercises (OP) - 05/21/15 0001    Exercises   Exercises Elbow   Elbow Exercises   Other elbow exercises Clarified exercises for Rt elbow pain: pt instructed to keep forearm neutral to perform gentle A/ROM in wrist flex/ext (pt was performing hand flex/ext) followed by turning forearm into pronation and perform gentle P/ROM in wrist flexion to stretch wrist extensors. (Pt advised to begin with heat, followed by massage, then these ex's)   Modalities   Modalities Fluidotherapy;Moist Heat   Moist Heat Therapy   Number Minutes Moist Heat 12 Minutes   Moist Heat Location Elbow  RUE while simultaneously rec'ing fluido Lt hand   LUE Fluidotherapy   Number Minutes Fluidotherapy 12 Minutes   LUE Fluidotherapy Location Hand;Wrist   Comments for pain, stiffness, and desensitization Lt hand/wrist (while simultaneously receiving hot pack to Rt elbow)   Splinting   Splinting Assessed current wrist splint for Rt however pt had taken metal stay out b/c it was so uncomfortable. Pt shown pre-fab D-ring wrist cock up splint we have in clinic. Pt preferred and reported increased comfort. Issued pre-fab D ring splint (for Rt lateral epicondylitis - pain relief) and reviewed wear and care with patient. (Awaiting arrival of counterforce band at elbow  to issue)   Manual Therapy   Manual therapy comments scar massage to Lt hand/wrist at incision and reviewed with pt. Pt instructed to follow up with tendon gliding exercises (previously issued)   Soft tissue mobilization Soft tissue mobilization, cross friction massage and myofascial release to wrist extensor musculature dorsal forearm. Pt noted to have increased pain and soreness/tenderness on medial/radial proximal dorsal side of forearm vs. at ECRB attachment (lateral proximal) associated more with lateral epicondylitis.                 OT Education -  05/21/15 1030    Education provided Yes   Education Details Splint wear and care (D-ring splint issued). Review of progression: heat, massage, ROM (and proper techniques) for Rt elbow, review of Lt hand scar massage followed by tendon glides   Person(s) Educated Patient   Methods Explanation;Demonstration   Comprehension Verbalized understanding;Returned demonstration          OT Short Term Goals - 05/13/15 2054    OT SHORT TERM GOAL #1   Title Pt will be independent with initial HEP.--check STGs 06/12/15   Time 4   Period Weeks   Status New   OT SHORT TERM GOAL #2   Title Pt will report pain less than or equal to 5/10 in R elbow and L wrist/hand.   Time 4   Period Weeks   Status New   OT SHORT TERM GOAL #3   Title Pt will be independent with splint wear/care prn.   Time 4   Period Weeks   Status New   OT SHORT TERM GOAL #4   Title Pt will verbalize understanding of AE/adaptive strategies for ADLs to incr ease/reduce pain prn.   Time 4   Period Weeks   Status New   OT SHORT TERM GOAL #5   Title Pt will improve L wrist extension to at least 50 degrees for ADLs.   Baseline 30   Time 4   Period Weeks   Status New           OT Long Term Goals - 05/13/15 2057    OT LONG TERM GOAL #1   Title Pt will be independent with updated HEP.--check LTGs 07/12/15   Time 8   Period Weeks   Status New   OT LONG TERM GOAL  #2   Title Pt will report pain less than or equal to 5/10 in R elbow and L wrist/hand.   Time 8   Period Weeks   Status New   OT LONG TERM GOAL #3   Title Pt will improve UE functional use as shown by improving score on quick DASH to 35% or less.   Baseline 57.5%   Time 8   Period Weeks   Status New   OT LONG TERM GOAL #4   Title Pt will improve bilateral grip strength by at least 5lbs for ADLs.   Baseline R-32lbs, L-33lbs   Time 8   Period Weeks   Status New               Plan - 05/21/15 1032    Clinical Impression Statement Pt tender/sore at dorsal proximal forearm along wrist extensor musculature (radial/medial > ulnar/lateral) and pt reports more pain with forearm rotation (sup/pron) more than wrist extension.    Plan assess D-ring splint issued today, Ultrasound (continuous for Rt dorsal forearm, pulsed Lt volar wrist at incision), issue counterband brace for Rt elbow if arrived, update/issue putty ex's Lt hand (per protocol CTR)    OT Home Exercise Plan issued:  05/19/15--HEP for RUE (wrist flex with elbow bent, heat, massage), LUE (tendon glides, scar massage). 05/21/15: review of all, splint wear and care   Consulted and Agree with Plan of Care Patient        Problem List Patient Active Problem List   Diagnosis Date Noted  . Lateral epicondylitis of right elbow 04/17/2015  . Degenerative lumbar disc 12/30/2014  . Cough   . Major depressive disorder, recurrent severe without psychotic features   . Right carpal tunnel syndrome  09/13/2014  . Degenerative joint disease involving multiple joints on both sides of body (right foot pain) 08/14/2014  . TMJ syndrome 06/18/2014  . Chronic low back pain 04/15/2014  . Periumbilical hernia 02/02/2014  . Hot flashes 01/02/2014  . Diastolic CHF 07/11/2013  . Fibromyalgia syndrome 06/18/2013  . Abdominal pain, epigastric 03/29/2013  . Myalgia 12/21/2012  . Chronic chest pain 11/21/2012  . Carpal tunnel syndrome, bilateral  07/25/2012  . Tension headache, chronic 05/24/2012  . Iron deficiency anemia 09/22/2011  . Depression 07/14/2011  . Anxiety 06/02/2011  . Acute renal failure superimposed on stage 3 chronic kidney disease 01/22/2011  . Lumbar spondylosis with myelopathy 11/20/2010  . Coronary atherosclerosis 03/10/2010  . Dyslipidemia 12/11/2008  . VITAMIN D DEFICIENCY 11/26/2008  . Uncontrolled type 2 diabetes mellitus with insulin therapy 05/25/2007  . PERIPHERAL NEUROPATHY 05/25/2007  . Essential hypertension 05/25/2007  . GERD 05/25/2007  . SYMPTOM, APNEA, SLEEP NOS 05/25/2007    Kelli Churn, OTR/L 05/21/2015, 10:57 AM  [facility] [address] [place], [place], [address] Phone: [phone]   Fax:  [phone]

## 2015-05-26 ENCOUNTER — Ambulatory Visit: Payer: Medicare Other | Attending: Internal Medicine | Admitting: Occupational Therapy

## 2015-05-26 DIAGNOSIS — M25521 Pain in right elbow: Secondary | ICD-10-CM | POA: Diagnosis not present

## 2015-05-26 DIAGNOSIS — M25532 Pain in left wrist: Secondary | ICD-10-CM | POA: Diagnosis not present

## 2015-05-26 DIAGNOSIS — R29898 Other symptoms and signs involving the musculoskeletal system: Secondary | ICD-10-CM | POA: Diagnosis not present

## 2015-05-26 DIAGNOSIS — M25632 Stiffness of left wrist, not elsewhere classified: Secondary | ICD-10-CM | POA: Diagnosis not present

## 2015-05-26 DIAGNOSIS — M25621 Stiffness of right elbow, not elsewhere classified: Secondary | ICD-10-CM | POA: Diagnosis not present

## 2015-05-26 DIAGNOSIS — M25531 Pain in right wrist: Secondary | ICD-10-CM | POA: Insufficient documentation

## 2015-05-26 DIAGNOSIS — M79642 Pain in left hand: Secondary | ICD-10-CM | POA: Diagnosis not present

## 2015-05-26 NOTE — Patient Instructions (Addendum)
AROM: Wrist Extension   .  With LEFT palm down, bend wrist up. Repeat __15__ times per set.  Do __3__ sessions per day  Extension (Passive)   Using other hand, lift hand at wrist as far as possible. Hold 10 seconds. Repeat 5 times. Do 3 sessions per day.

## 2015-05-26 NOTE — Therapy (Signed)
[facility] Health Out[facility] Rehabilitation [facility] [address] [place], [place], [address] Phone: [phone]   Fax:  [phone]  Occupational Therapy Treatment  Patient Details  Name: [name] MRN: [id number] Date of Birth: 07/31/1951 Referring Provider:  Denton Brick, MD  Encounter Date: 05/26/2015      OT End of Session - 05/26/15 0858    Visit Number 4   Number of Visits 16   Date for OT Re-Evaluation 07/12/15   Authorization Type UHC Medicare/Medicaid, G-code needed   Authorization Time Period 05/13/15-07/12/15   Authorization - Visit Number 4   Authorization - Number of Visits 10   OT Start Time 0849   OT Stop Time 0930   OT Time Calculation (min) 41 min   Activity Tolerance Patient limited by pain      Past Medical History  Diagnosis Date  . Hypertension   . Peripheral neuropathy   . Cervicalgia   . Low back pain syndrome   . Hyperlipidemia   . Vitamin D deficiency   . Vitamin B12 deficiency   . Iliotibial band syndrome   . Anemia   . Borderline personality disorder   . Nonorganic psychosis   . GERD (gastroesophageal reflux disease)   . Diabetes mellitus   . Depression   . Diabetic gastroparesis   . CAD (coronary artery disease)     Catherization 11/18/09>nonobstructive CAD , normal LV function, EF 65%  . Health maintenance examination     Colonoscopy 2009> normal; Diabetic eye exam 12/2008. No diabetic retinopathy; Mammogram 11/10> No evidence of malignancy, DXA 03/14/13 : normal  . Headache(784.0)   . Arthritis   . SBO (small bowel obstruction) 01/09/2013  . Diastolic CHF     Grade I, on Echo 06/2013, EF 55-60%  . Sleep apnea     does not wear CPAP   . Diabetes mellitus, type II   . Wears glasses   . Wears dentures     upper  . Gout   . Right carpal tunnel syndrome 09/13/2014    Past Surgical History  Procedure Laterality Date  . Abdominal hysterectomy    . Hand surgery  1992    rt  . Bowel resection N/A 01/10/2013     Procedure: SMALL BOWEL RESECTION;  Surgeon: Lodema Pilot, DO;  Location: [facility];  Service: General;  Laterality: N/A;  . Lysis of adhesion N/A 01/10/2013    Procedure: LYSIS OF ADHESION;  Surgeon: Lodema Pilot, DO;  Location: [facility];  Service: General;  Laterality: N/A;  . Laparotomy N/A 01/10/2013    Procedure: EXPLORATORY LAPAROTOMY;  Surgeon: Lodema Pilot, DO;  Location: [facility];  Service: General;  Laterality: N/A;  . Incisional hernia repair N/A 03/01/2014    Procedure: LAPAROSCOPIC INCISIONAL HERNIA;  Surgeon: Axel Filler, MD;  Location: [facility];  Service: General;  Laterality: N/A;  . Insertion of mesh N/A 03/01/2014    Procedure: INSERTION OF MESH;  Surgeon: Axel Filler, MD;  Location: [facility];  Service: General;  Laterality: N/A;  . Hernia repair  03/01/14    Lap incisional hernia repair w/mesh  . Appendectomy    . Carpal tunnel release Right 09/13/2014    Procedure: RIGHT WRIST CARPAL TUNNEL RELEASE;  Surgeon: Eulas Post, MD;  Location: [facility];  Service: Orthopedics;  Laterality: Right;  . Steriod injection Left 09/13/2014    Procedure: STEROID INJECTION LEFT HAND;  Surgeon: Eulas Post, MD;  Location: [facility];  Service: Orthopedics;  Laterality: Left;  . Carpal tunnel release Left 02/28/2015    Procedure: LEFT CARPAL TUNNEL RELEASE;  Surgeon: Teryl Lucy, MD;  Location: [facility];  Service: Orthopedics;  Laterality: Left;    There were no vitals filed for this visit.  Visit Diagnosis:  Right elbow pain  Elbow stiffness, right  Pain in both wrists  Bilateral arm weakness  Stiffness of left wrist joint      Subjective Assessment - 05/26/15 0856    Subjective  Pt reports that she is still having a lot of pain, but it's better than it was   Pertinent History R CTR 09/14/14, L CTR 02/28/15, R elbow pain begain after L CTR    Repetition Increases Symptoms   Patient Stated Goals improve pain and hand use   Currently  in Pain? Yes   Pain Score 6    Pain Location Elbow   Pain Orientation Right   Pain Descriptors / Indicators Aching   Pain Frequency Constant   Aggravating Factors  moving, use   Pain Relieving Factors heat   Pain Score 3   Pain Location Hand   Pain Orientation Left   Pain Descriptors / Indicators Aching   Aggravating Factors  movement, use    Pain Relieving Factors rest                      OT Treatments/Exercises (OP) - 05/26/15 0001    Exercises   Exercises Hand   Elbow Exercises   Other elbow exercises Reviewed R lateral epicondylitis AROM ex #1, 2   Hand Exercises   Tendon Glides x15 R hand   Moist Heat Therapy   Number Minutes Moist Heat 12 Minutes   Moist Heat Location R Elbow  with no adverse reactions for pain/stiffness   Ultrasound   Ultrasound Location L palm incision site/scar   Ultrasound Parameters , 1.0wts/cm2, 20% pulsed x58min with no adverse reactions for scar tissue   Ultrasound Goals Pain;Other (Comment)  scar tissue   LUE Fluidotherapy   Number Minutes Fluidotherapy 12 Minutes   LUE Fluidotherapy Location Hand;Wrist   Comments to left hand with no adverse reactions for pain/stiffness while hot pack to R elbow   Manual Therapy   Soft tissue mobilization myofascial release/soft tissue mobs to R dorsal forarm for pain/tightness                OT Education - 05/26/15 0922    Education Details AROM/PROM L wrist extension; Lateral Epicondylitis Ex #1-2 R wrist flex/ext with elbow bent (forearm neutral and in pronation)   Person(s) Educated Patient   Methods Explanation;Demonstration;Handout;Verbal cues   Comprehension Verbalized understanding;Returned demonstration;Verbal cues required          OT Short Term Goals - 05/13/15 2054    OT SHORT TERM GOAL #1   Title Pt will be independent with initial HEP.--check STGs 06/12/15   Time 4   Period Weeks   Status New   OT SHORT TERM GOAL #2   Title Pt will report pain less than  or equal to 5/10 in R elbow and L wrist/hand.   Time 4   Period Weeks   Status New   OT SHORT TERM GOAL #3   Title Pt will be independent with splint wear/care prn.   Time 4   Period Weeks   Status New   OT SHORT TERM GOAL #4   Title Pt will verbalize understanding of AE/adaptive strategies for ADLs to incr  ease/reduce pain prn.   Time 4   Period Weeks   Status New   OT SHORT TERM GOAL #5   Title Pt will improve L wrist extension to at least 50 degrees for ADLs.   Baseline 30   Time 4   Period Weeks   Status New           OT Long Term Goals - 05/13/15 2057    OT LONG TERM GOAL #1   Title Pt will be independent with updated HEP.--check LTGs 07/12/15   Time 8   Period Weeks   Status New   OT LONG TERM GOAL #2   Title Pt will report pain less than or equal to 5/10 in R elbow and L wrist/hand.   Time 8   Period Weeks   Status New   OT LONG TERM GOAL #3   Title Pt will improve UE functional use as shown by improving score on quick DASH to 35% or less.   Baseline 57.5%   Time 8   Period Weeks   Status New   OT LONG TERM GOAL #4   Title Pt will improve bilateral grip strength by at least 5lbs for ADLs.   Baseline R-32lbs, L-33lbs   Time 8   Period Weeks   Status New               Plan - 05/26/15 1301    Plan Ultrasound to R dorsal forarm and L incision site, issue counterforce band for R elbow (did not arrive be 8/1 appt), putty HEP for L hand   OT Home Exercise Plan issued:  05/19/15--HEP for RUE (wrist flex with elbow bent, heat, massage), LUE (tendon glides, scar massage). 05/21/15: review of all, splint wear and care; 05/26/15: AROM/PROM to L wrist in ext, lateral epicondylitis AROM ex #1-2   Consulted and Agree with Plan of Care Patient        Problem List Patient Active Problem List   Diagnosis Date Noted  . Lateral epicondylitis of right elbow 04/17/2015  . Degenerative lumbar disc 12/30/2014  . Cough   . Major depressive disorder, recurrent severe  without psychotic features   . Right carpal tunnel syndrome 09/13/2014  . Degenerative joint disease involving multiple joints on both sides of body (right foot pain) 08/14/2014  . TMJ syndrome 06/18/2014  . Chronic low back pain 04/15/2014  . Periumbilical hernia 02/02/2014  . Hot flashes 01/02/2014  . Diastolic CHF 07/11/2013  . Fibromyalgia syndrome 06/18/2013  . Abdominal pain, epigastric 03/29/2013  . Myalgia 12/21/2012  . Chronic chest pain 11/21/2012  . Carpal tunnel syndrome, bilateral 07/25/2012  . Tension headache, chronic 05/24/2012  . Iron deficiency anemia 09/22/2011  . Depression 07/14/2011  . Anxiety 06/02/2011  . Acute renal failure superimposed on stage 3 chronic kidney disease 01/22/2011  . Lumbar spondylosis with myelopathy 11/20/2010  . Coronary atherosclerosis 03/10/2010  . Dyslipidemia 12/11/2008  . VITAMIN D DEFICIENCY 11/26/2008  . Uncontrolled type 2 diabetes mellitus with insulin therapy 05/25/2007  . PERIPHERAL NEUROPATHY 05/25/2007  . Essential hypertension 05/25/2007  . GERD 05/25/2007  . SYMPTOM, APNEA, SLEEP NOS 05/25/2007    [facility] 05/26/2015, 1:03 PM  [facility] [address] [place], [place], [address] Phone: [phone]   Fax:  [phone]   Willa Frater, OTR/L 05/26/2015 1:03 PM

## 2015-05-28 ENCOUNTER — Ambulatory Visit: Payer: Medicare Other | Admitting: Occupational Therapy

## 2015-05-28 ENCOUNTER — Encounter: Payer: Self-pay | Admitting: Occupational Therapy

## 2015-05-28 DIAGNOSIS — M25532 Pain in left wrist: Secondary | ICD-10-CM | POA: Diagnosis not present

## 2015-05-28 DIAGNOSIS — R29898 Other symptoms and signs involving the musculoskeletal system: Secondary | ICD-10-CM

## 2015-05-28 DIAGNOSIS — M25521 Pain in right elbow: Secondary | ICD-10-CM

## 2015-05-28 DIAGNOSIS — M25531 Pain in right wrist: Secondary | ICD-10-CM | POA: Diagnosis not present

## 2015-05-28 DIAGNOSIS — M79642 Pain in left hand: Secondary | ICD-10-CM | POA: Diagnosis not present

## 2015-05-28 DIAGNOSIS — M25621 Stiffness of right elbow, not elsewhere classified: Secondary | ICD-10-CM | POA: Diagnosis not present

## 2015-05-28 DIAGNOSIS — M25632 Stiffness of left wrist, not elsewhere classified: Secondary | ICD-10-CM

## 2015-05-28 NOTE — Therapy (Signed)
[facility] [address] [place], [place], [address] Phone: [phone]   Fax:  [phone]  Occupational Therapy Treatment  Patient Details  Name: [name] MRN: [id number] Date of Birth: [birth year] Referring Provider:  Denton Brick, MD  Encounter Date: 05/28/2015      OT End of Session - 05/28/15 0933    Visit Number 5   Number of Visits 16   Date for OT Re-Evaluation 07/12/15   Authorization Type UHC Medicare/Medicaid, G-code needed   Authorization Time Period 05/13/15-07/12/15   Authorization - Visit Number 5   Authorization - Number of Visits 10   OT Start Time 0848   OT Stop Time 0930   OT Time Calculation (min) 42 min   Equipment Utilized During Treatment Hot pack, ultrasound   Activity Tolerance Patient limited by pain      Past Medical History  Diagnosis Date  . Hypertension   . Peripheral neuropathy   . Cervicalgia   . Low back pain syndrome   . Hyperlipidemia   . Vitamin D deficiency   . Vitamin B12 deficiency   . Iliotibial band syndrome   . Anemia   . Borderline personality disorder   . Nonorganic psychosis   . GERD (gastroesophageal reflux disease)   . Diabetes mellitus   . Depression   . Diabetic gastroparesis   . CAD (coronary artery disease)     Catherization 11/18/09>nonobstructive CAD , normal LV function, EF 65%  . Health maintenance examination     Colonoscopy 2009> normal; Diabetic eye exam 12/2008. No diabetic retinopathy; Mammogram 11/10> No evidence of malignancy, DXA 03/14/13 : normal  . Headache(784.0)   . Arthritis   . SBO (small bowel obstruction) 01/09/2013  . Diastolic CHF     Grade I, on Echo 06/2013, EF 55-60%  . Sleep apnea     does not wear CPAP   . Diabetes mellitus, type II   . Wears glasses   . Wears dentures     upper  . Gout   . Right carpal tunnel syndrome 09/13/2014    Past Surgical History  Procedure Laterality Date  . Abdominal hysterectomy    .  Hand surgery  1992    rt  . Bowel resection N/A 01/10/2013    Procedure: SMALL BOWEL RESECTION;  Surgeon: Lodema Pilot, DO;  Location: [facility] OR;  Service: General;  Laterality: N/A;  . Lysis of adhesion N/A 01/10/2013    Procedure: LYSIS OF ADHESION;  Surgeon: Lodema Pilot, DO;  Location: [facility] OR;  Service: General;  Laterality: N/A;  . Laparotomy N/A 01/10/2013    Procedure: EXPLORATORY LAPAROTOMY;  Surgeon: Lodema Pilot, DO;  Location: [facility] OR;  Service: General;  Laterality: N/A;  . Incisional hernia repair N/A 03/01/2014    Procedure: LAPAROSCOPIC INCISIONAL HERNIA;  Surgeon: Axel Filler, MD;  Location: [facility];  Service: General;  Laterality: N/A;  . Insertion of mesh N/A 03/01/2014    Procedure: INSERTION OF MESH;  Surgeon: Axel Filler, MD;  Location: [facility];  Service: General;  Laterality: N/A;  . Hernia repair  03/01/14    Lap incisional hernia repair w/mesh  . Appendectomy    . Carpal tunnel release Right 09/13/2014    Procedure: RIGHT WRIST CARPAL TUNNEL RELEASE;  Surgeon: Eulas Post, MD;  Location: [facility];  Service: Orthopedics;  Laterality: Right;  . Steriod injection Left 09/13/2014    Procedure: STEROID INJECTION LEFT HAND;  Surgeon: Eulas Post, MD;  Location: [facility];  Service: Orthopedics;  Laterality: Left;  . Carpal tunnel release Left 02/28/2015    Procedure: LEFT CARPAL TUNNEL RELEASE;  Surgeon: Teryl Lucy, MD;  Location: [facility];  Service: Orthopedics;  Laterality: Left;    There were no vitals filed for this visit.  Visit Diagnosis:  Right elbow pain  Stiffness of left wrist joint  Decreased grip strength      Subjective Assessment - 05/28/15 0850    Pertinent History R CTR 09/14/14, L CTR 02/28/15, R elbow pain begain after L CTR    Repetition Increases Symptoms   Patient Stated Goals improve pain and hand use   Currently in Pain? Yes   Pain Score 5    Pain Location Elbow   Pain Orientation Right    Pain Descriptors / Indicators Aching   Pain Type Chronic pain   Pain Onset More than a month ago   Pain Frequency Intermittent   Aggravating Factors  movement                      OT Treatments/Exercises (OP) - 05/28/15 0001    Hand Exercises   Other Hand Exercises Putty exercises (for CTR) issued as HEP including: intrinsic (-)/hook flexion, full flexion, thumb flexion, thumb extension, finger extension, and "twist". Pt demo each x 10 reps. Issued yellow putty. Pt instructed to precede these exercise with moist heat prn, and scar massage   Moist Heat Therapy   Number Minutes Moist Heat 10 Minutes   Moist Heat Location Elbow  Rt elbow for pain while during ultrasound   Ultrasound   Ultrasound Location Lt palm incision site/scar   Ultrasound Parameters 3 Mhz, 20%, 0.8 wts/cm2 x 8 min.   (while rec'ing hot pack to Rt elbow)                OT Education - 05/28/15 0931    Education Details Putty HEP for Lt hand (CTR protocol)   Person(s) Educated Patient   Methods Explanation;Demonstration;Handout  HANDOUT NOT IN EPIC   Comprehension Verbalized understanding;Returned demonstration          OT Short Term Goals - 05/13/15 2054    OT SHORT TERM GOAL #1   Title Pt will be independent with initial HEP.--check STGs 06/12/15   Time 4   Period Weeks   Status New   OT SHORT TERM GOAL #2   Title Pt will report pain less than or equal to 5/10 in R elbow and L wrist/hand.   Time 4   Period Weeks   Status New   OT SHORT TERM GOAL #3   Title Pt will be independent with splint wear/care prn.   Time 4   Period Weeks   Status New   OT SHORT TERM GOAL #4   Title Pt will verbalize understanding of AE/adaptive strategies for ADLs to incr ease/reduce pain prn.   Time 4   Period Weeks   Status New   OT SHORT TERM GOAL #5   Title Pt will improve L wrist extension to at least 50 degrees for ADLs.   Baseline 30   Time 4   Period Weeks   Status New            OT Long Term Goals - 05/13/15 2057    OT LONG TERM GOAL #1   Title Pt will be independent with updated HEP.--check LTGs 07/12/15   Time 8   Period Weeks  Status New   OT LONG TERM GOAL #2   Title Pt will report pain less than or equal to 5/10 in R elbow and L wrist/hand.   Time 8   Period Weeks   Status New   OT LONG TERM GOAL #3   Title Pt will improve UE functional use as shown by improving score on quick DASH to 35% or less.   Baseline 57.5%   Time 8   Period Weeks   Status New   OT LONG TERM GOAL #4   Title Pt will improve bilateral grip strength by at least 5lbs for ADLs.   Baseline R-32lbs, L-33lbs   Time 8   Period Weeks   Status New               Plan - 05/28/15 0934    Clinical Impression Statement Pt progressing with Lt hand and tolerating light strengthening.    Plan ultrasound to Rt dorsal forearm and Lt incision site, issue counterforce band if arrived, continue manual therapy for Rt forearm, review putty HEP prn   OT Home Exercise Plan issued:  05/19/15--HEP for RUE (wrist flex with elbow bent, heat, massage), LUE (tendon glides, scar massage). 05/21/15: review of all, splint wear and care; 05/26/15: AROM/PROM to L wrist in ext, lateral epicondylitis AROM ex #1-2. 05/28/15: Putty HEP for Lt hand (CTR protocol)   Consulted and Agree with Plan of Care Patient        Problem List Patient Active Problem List   Diagnosis Date Noted  . Lateral epicondylitis of right elbow 04/17/2015  . Degenerative lumbar disc 12/30/2014  . Cough   . Major depressive disorder, recurrent severe without psychotic features   . Right carpal tunnel syndrome 09/13/2014  . Degenerative joint disease involving multiple joints on both sides of body (right foot pain) 08/14/2014  . TMJ syndrome 06/18/2014  . Chronic low back pain 04/15/2014  . Periumbilical hernia 02/02/2014  . Hot flashes 01/02/2014  . Diastolic CHF 07/11/2013  . Fibromyalgia syndrome 06/18/2013  . Abdominal pain,  epigastric 03/29/2013  . Myalgia 12/21/2012  . Chronic chest pain 11/21/2012  . Carpal tunnel syndrome, bilateral 07/25/2012  . Tension headache, chronic 05/24/2012  . Iron deficiency anemia 09/22/2011  . Depression 07/14/2011  . Anxiety 06/02/2011  . Acute renal failure superimposed on stage 3 chronic kidney disease 01/22/2011  . Lumbar spondylosis with myelopathy 11/20/2010  . Coronary atherosclerosis 03/10/2010  . Dyslipidemia 12/11/2008  . VITAMIN D DEFICIENCY 11/26/2008  . Uncontrolled type 2 diabetes mellitus with insulin therapy 05/25/2007  . PERIPHERAL NEUROPATHY 05/25/2007  . Essential hypertension 05/25/2007  . GERD 05/25/2007  . SYMPTOM, APNEA, SLEEP NOS 05/25/2007    Kelli Churn, OTR/L 05/28/2015, 9:40 AM  [facility] [address] [place], [place], [address] Phone: [phone]   Fax:  [phone]

## 2015-05-29 ENCOUNTER — Encounter (HOSPITAL_COMMUNITY): Payer: Self-pay | Admitting: Clinical

## 2015-05-29 ENCOUNTER — Ambulatory Visit (INDEPENDENT_AMBULATORY_CARE_PROVIDER_SITE_OTHER): Payer: 59 | Admitting: Clinical

## 2015-05-29 DIAGNOSIS — F331 Major depressive disorder, recurrent, moderate: Secondary | ICD-10-CM
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
## 2015-06-02 ENCOUNTER — Emergency Department (HOSPITAL_COMMUNITY)
Admission: EM | Admit: 2015-06-02 | Discharge: 2015-06-02 | Disposition: A | Discharge status: Home / Self Care | Payer: Medicare Other | Attending: Emergency Medicine | Admitting: Emergency Medicine

## 2015-06-02 ENCOUNTER — Telehealth (HOSPITAL_COMMUNITY): Payer: Self-pay

## 2015-06-02 ENCOUNTER — Ambulatory Visit: Payer: Medicare Other | Admitting: Occupational Therapy

## 2015-06-02 ENCOUNTER — Encounter (HOSPITAL_COMMUNITY): Payer: Self-pay | Admitting: Emergency Medicine

## 2015-06-02 DIAGNOSIS — F331 Major depressive disorder, recurrent, moderate: Secondary | ICD-10-CM

## 2015-06-02 DIAGNOSIS — Z79899 Other long term (current) drug therapy: Secondary | ICD-10-CM | POA: Insufficient documentation

## 2015-06-02 DIAGNOSIS — Z87891 Personal history of nicotine dependence: Secondary | ICD-10-CM | POA: Diagnosis not present

## 2015-06-02 DIAGNOSIS — E1143 Type 2 diabetes mellitus with diabetic autonomic (poly)neuropathy: Secondary | ICD-10-CM | POA: Insufficient documentation

## 2015-06-02 DIAGNOSIS — E785 Hyperlipidemia, unspecified: Secondary | ICD-10-CM | POA: Insufficient documentation

## 2015-06-02 DIAGNOSIS — K219 Gastro-esophageal reflux disease without esophagitis: Secondary | ICD-10-CM | POA: Diagnosis not present

## 2015-06-02 DIAGNOSIS — I2510 Atherosclerotic heart disease of native coronary artery without angina pectoris: Secondary | ICD-10-CM | POA: Insufficient documentation

## 2015-06-02 DIAGNOSIS — Z880 Allergy status to penicillin: Secondary | ICD-10-CM | POA: Diagnosis not present

## 2015-06-02 DIAGNOSIS — F4321 Adjustment disorder with depressed mood: Secondary | ICD-10-CM

## 2015-06-02 DIAGNOSIS — I5030 Unspecified diastolic (congestive) heart failure: Secondary | ICD-10-CM | POA: Diagnosis not present

## 2015-06-02 DIAGNOSIS — F410 Panic disorder [episodic paroxysmal anxiety] without agoraphobia: Secondary | ICD-10-CM | POA: Diagnosis present

## 2015-06-02 DIAGNOSIS — F4320 Adjustment disorder, unspecified: Secondary | ICD-10-CM | POA: Insufficient documentation

## 2015-06-02 DIAGNOSIS — Z8669 Personal history of other diseases of the nervous system and sense organs: Secondary | ICD-10-CM | POA: Insufficient documentation

## 2015-06-02 DIAGNOSIS — Z862 Personal history of diseases of the blood and blood-forming organs and certain disorders involving the immune mechanism: Secondary | ICD-10-CM | POA: Diagnosis not present

## 2015-06-02 DIAGNOSIS — Z794 Long term (current) use of insulin: Secondary | ICD-10-CM | POA: Diagnosis not present

## 2015-06-02 DIAGNOSIS — F329 Major depressive disorder, single episode, unspecified: Secondary | ICD-10-CM | POA: Diagnosis not present

## 2015-06-02 DIAGNOSIS — I10 Essential (primary) hypertension: Secondary | ICD-10-CM | POA: Insufficient documentation

## 2015-06-02 DIAGNOSIS — Z7982 Long term (current) use of aspirin: Secondary | ICD-10-CM | POA: Diagnosis not present

## 2015-06-02 DIAGNOSIS — M1990 Unspecified osteoarthritis, unspecified site: Secondary | ICD-10-CM | POA: Insufficient documentation

## 2015-06-02 LAB — CBC WITH DIFFERENTIAL/PLATELET
Basophils Absolute: 0 10*3/uL (ref 0.0–0.1)
Basophils Relative: 0 % (ref 0–1)
Eosinophils Absolute: 0.2 10*3/uL (ref 0.0–0.7)
Eosinophils Relative: 2 % (ref 0–5)
HCT: 35.9 % — ABNORMAL LOW (ref 36.0–46.0)
Hemoglobin: 11.4 g/dL — ABNORMAL LOW (ref 12.0–15.0)
LYMPHS PCT: 34 % (ref 12–46)
Lymphs Abs: 2.7 10*3/uL (ref 0.7–4.0)
MCH: 24.8 pg — ABNORMAL LOW (ref 26.0–34.0)
MCHC: 31.8 g/dL (ref 30.0–36.0)
MCV: 78.2 fL (ref 78.0–100.0)
MONOS PCT: 5 % (ref 3–12)
Monocytes Absolute: 0.4 10*3/uL (ref 0.1–1.0)
NEUTROS PCT: 59 % (ref 43–77)
Neutro Abs: 4.6 10*3/uL (ref 1.7–7.7)
PLATELETS: 207 10*3/uL (ref 150–400)
RBC: 4.59 MIL/uL (ref 3.87–5.11)
RDW: 16.8 % — ABNORMAL HIGH (ref 11.5–15.5)
WBC: 8 10*3/uL (ref 4.0–10.5)

## 2015-06-02 LAB — COMPREHENSIVE METABOLIC PANEL
ALK PHOS: 88 U/L (ref 38–126)
ALT: 14 U/L (ref 14–54)
AST: 23 U/L (ref 15–41)
Albumin: 3.9 g/dL (ref 3.5–5.0)
Anion gap: 12 (ref 5–15)
BILIRUBIN TOTAL: 0.3 mg/dL (ref 0.3–1.2)
BUN: 23 mg/dL — ABNORMAL HIGH (ref 6–20)
CHLORIDE: 103 mmol/L (ref 101–111)
CO2: 22 mmol/L (ref 22–32)
CREATININE: 1.69 mg/dL — AB (ref 0.44–1.00)
Calcium: 9.3 mg/dL (ref 8.9–10.3)
GFR calc Af Amer: 36 mL/min — ABNORMAL LOW (ref >60)
GFR calc non Af Amer: 31 mL/min — ABNORMAL LOW (ref >60)
Glucose, Bld: 116 mg/dL — ABNORMAL HIGH (ref 65–99)
POTASSIUM: 4.5 mmol/L (ref 3.5–5.1)
SODIUM: 137 mmol/L (ref 135–145)
TOTAL PROTEIN: 7.8 g/dL (ref 6.5–8.1)

## 2015-06-02 LAB — CBG MONITORING, ED: Glucose-Capillary: 157 mg/dL — ABNORMAL HIGH (ref 65–99)

## 2015-06-02 LAB — ETHANOL

## 2015-06-02 MED ORDER — LORAZEPAM 2 MG/ML IJ SOLN
INTRAMUSCULAR | Status: AC
  Filled 2015-06-02: qty 1

## 2015-06-02 MED ORDER — CLONAZEPAM 0.5 MG PO TABS: ORAL_TABLET | ORAL | Status: DC

## 2015-06-02 MED ORDER — LORAZEPAM 2 MG/ML IJ SOLN
2.0000 mg | Freq: Once | INTRAMUSCULAR | Status: AC
  Administered 2015-06-02: 2 mg via INTRAMUSCULAR
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
## 2015-06-02 NOTE — Discharge Instructions (Signed)
Grief Reaction Grief is a normal response to the death of someone close to you. Feelings of fear, anger, and guilt can affect almost everyone who loses someone they love. Symptoms of depression are also common. These include problems with sleep, loss of appetite, and lack of energy. These grief reaction symptoms often last for weeks to months after a loss. They may also return during special times that remind you of the person you lost, such as an anniversary or birthday. Anxiety, insomnia, irritability, and deep depression may last beyond the period of normal grief. If you experience these feelings for 6 months or longer, you may have clinical depression. Clinical depression requires further medical attention. If you think that you have clinical depression, you should contact your caregiver. If you have a history of depression or a family history of depression, you are at greater risk of clinical depression. You are also at greater risk of developing clinical depression if the loss was traumatic or the loss was of someone with whom you had unresolved issues.  A grief reaction can become complicated by being blocked. This means being unable to cry or express extreme emotions. This may prolong the grieving period and worsen the emotional effects of the loss. Mourning is a natural event in human life. A healthy grief reaction is one that is not blocked. It requires a time of sadness and readjustment. It is very important to share your sorrow and fear with others, especially close friends and family. Professional counselors and clergy can also help you process your grief. Document Released: 10/11/2005 Document Revised: 02/25/2014 Document Reviewed: 06/21/2006 [employer] Patient Information 2015 [employer]. This information is not intended to replace advice given to you by your health care provider. Make sure you discuss any questions you have with your health care provider.  
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
## 2015-06-04 ENCOUNTER — Ambulatory Visit: Payer: Medicare Other | Admitting: Occupational Therapy

## 2015-06-05 ENCOUNTER — Ambulatory Visit: Payer: Medicare Other | Admitting: Occupational Therapy

## 2015-06-09 ENCOUNTER — Ambulatory Visit: Payer: Medicare Other | Admitting: Occupational Therapy

## 2015-06-10 ENCOUNTER — Ambulatory Visit: Payer: Medicare Other | Admitting: Occupational Therapy

## 2015-06-13 ENCOUNTER — Other Ambulatory Visit: Payer: Self-pay | Admitting: Gastroenterology

## 2015-06-13 DIAGNOSIS — R110 Nausea: Secondary | ICD-10-CM | POA: Diagnosis not present

## 2015-06-13 DIAGNOSIS — K2950 Unspecified chronic gastritis without bleeding: Secondary | ICD-10-CM | POA: Diagnosis not present

## 2015-06-15 ENCOUNTER — Other Ambulatory Visit: Payer: Self-pay | Admitting: Internal Medicine

## 2015-06-17 ENCOUNTER — Telehealth: Payer: Self-pay | Admitting: Internal Medicine

## 2015-06-17 NOTE — Telephone Encounter (Signed)
Call to patient to confirm appointment for 06/18/15 at 1:45 lmtcb

## 2015-06-18 ENCOUNTER — Ambulatory Visit (INDEPENDENT_AMBULATORY_CARE_PROVIDER_SITE_OTHER): Payer: Medicare Other | Admitting: Internal Medicine

## 2015-06-18 VITALS — BP 141/55 | HR 77 | Temp 97.6°F | Ht 65.0 in | Wt 179.7 lb

## 2015-06-18 DIAGNOSIS — Z0000 Encounter for general adult medical examination without abnormal findings: Secondary | ICD-10-CM

## 2015-06-18 DIAGNOSIS — IMO0002 Reserved for concepts with insufficient information to code with codable children: Secondary | ICD-10-CM

## 2015-06-18 DIAGNOSIS — E1165 Type 2 diabetes mellitus with hyperglycemia: Secondary | ICD-10-CM

## 2015-06-18 DIAGNOSIS — D509 Iron deficiency anemia, unspecified: Secondary | ICD-10-CM | POA: Diagnosis not present

## 2015-06-18 DIAGNOSIS — F329 Major depressive disorder, single episode, unspecified: Secondary | ICD-10-CM

## 2015-06-18 DIAGNOSIS — Z23 Encounter for immunization: Secondary | ICD-10-CM

## 2015-06-18 DIAGNOSIS — R079 Chest pain, unspecified: Secondary | ICD-10-CM | POA: Diagnosis not present

## 2015-06-18 DIAGNOSIS — Z794 Long term (current) use of insulin: Secondary | ICD-10-CM

## 2015-06-18 DIAGNOSIS — I10 Essential (primary) hypertension: Secondary | ICD-10-CM

## 2015-06-18 DIAGNOSIS — R1013 Epigastric pain: Secondary | ICD-10-CM | POA: Diagnosis not present

## 2015-06-18 DIAGNOSIS — G8929 Other chronic pain: Secondary | ICD-10-CM

## 2015-06-18 DIAGNOSIS — E559 Vitamin D deficiency, unspecified: Secondary | ICD-10-CM

## 2015-06-18 DIAGNOSIS — F32A Depression, unspecified: Secondary | ICD-10-CM

## 2015-06-18 DIAGNOSIS — E119 Type 2 diabetes mellitus without complications: Secondary | ICD-10-CM

## 2015-06-18 DIAGNOSIS — Z79899 Other long term (current) drug therapy: Secondary | ICD-10-CM

## 2015-06-18 LAB — GLUCOSE, CAPILLARY: Glucose-Capillary: 129 mg/dL — ABNORMAL HIGH (ref 65–99)

## 2015-06-18 MED ORDER — CLONIDINE HCL 0.3 MG/24HR TD PTWK: 0.3000 mg | MEDICATED_PATCH | TRANSDERMAL | Status: DC

## 2015-06-18 MED ORDER — SITAGLIPTIN PHOSPHATE 50 MG PO TABS: 50.0000 mg | ORAL_TABLET | Freq: Every day | ORAL | Status: DC

## 2015-06-18 MED ORDER — ISOSORBIDE MONONITRATE ER 60 MG PO TB24: 60.0000 mg | ORAL_TABLET | Freq: Every morning | ORAL | Status: DC
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
## 2015-06-18 NOTE — Assessment & Plan Note (Signed)
Follows w/ Dr. Matthias Hughs w/ [facility]. She had an EGD the beginning of this month, will wait for EGD report.

## 2015-06-18 NOTE — Assessment & Plan Note (Signed)
Lab Results  Component Value Date   HGBA1C 6.1 04/17/2015   HGBA1C 7.8 01/08/2015   HGBA1C 9.3 10/23/2014     Assessment: Diabetes control:  controlled  Progress toward A1C goal:   at goal Comments: on lantus 51 units qhs and januvia  daily. Glucometer report only has 7 reading ranging from 83-172.   Plan: Medications:  continue current medications Home glucose monitoring: Frequency:  TID Timing:  w meals Instruction/counseling given: reminded to bring blood glucose meter & log to each visit Educational resources provided: handout Self management tools provided: copy of home glucose meter download Other plans: f/u in 2 months for A1c

## 2015-06-18 NOTE — Assessment & Plan Note (Signed)
BP Readings from Last 3 Encounters:  06/18/15 141/55  06/02/15 140/89  04/17/15 137/65    Lab Results  Component Value Date   NA 137 06/02/2015   K 4.5 06/02/2015   CREATININE 1.69* 06/02/2015    Assessment: Blood pressure control:  controlled Progress toward BP goal:   at goal Comments: on cozaar, toprol, imdur, and clonidine.   Plan: Medications:  continue current medications Educational resources provided:   Self management tools provided:   Other plans: f/u in 2 months for BP check.

## 2015-06-18 NOTE — Assessment & Plan Note (Signed)
Hgb 11.4 on 8/8. She does not want to be on iron supplements at this time bc she wants to cut down on the number of meds she is on and does not want constipation side effects even though she knows she can take colace for it. Will cont to monitor.

## 2015-06-18 NOTE — Assessment & Plan Note (Signed)
Prevnar shot today. Less than 65 y/o however has DM and CKD. Also getting flu shot.

## 2015-06-18 NOTE — Assessment & Plan Note (Signed)
Can check Vit D level at next visit, not on any vit d supplements currently.

## 2015-06-18 NOTE — Assessment & Plan Note (Signed)
Refilled imdur, denies any chest pain today.

## 2015-06-18 NOTE — Progress Notes (Signed)
   Subjective:    Patient ID: [name], female    DOB: 1951-02-28, 64 y.o.   MRN: [id number]  HPI Pt is a 64 y/o female w/ PMHx of DM, HTN, grade 1 dCHF (EF 55-60 2014), and depression who presents to clinic for DM f/u. Please see problem list for further details.     Review of Systems  Constitutional: Negative for unexpected weight change.  HENT: Positive for sneezing.   Respiratory: Positive for cough. Negative for shortness of breath.   Cardiovascular: Negative for chest pain.  Gastrointestinal: Positive for abdominal pain (chronic).  Musculoskeletal: Positive for back pain.       Objective:   Physical Exam  Constitutional: She appears well-developed and well-nourished. No distress.  HENT:  Mouth/Throat: Oropharynx is clear and moist. No oropharyngeal exudate.  Cardiovascular: Normal rate, regular rhythm and normal heart sounds.   No murmur heard. Pulmonary/Chest: Effort normal and breath sounds normal. No respiratory distress. She has no wheezes.  Abdominal: Soft. Bowel sounds are normal. She exhibits distension (mild tenderness around periumblical region).  Neurological: She is alert.  Skin: Skin is warm and dry.  Psychiatric: She has a normal mood and affect.          Assessment & Plan:  Please see problem based assessment and plan.

## 2015-06-19 ENCOUNTER — Encounter: Payer: Self-pay | Admitting: Occupational Therapy

## 2015-06-19 ENCOUNTER — Ambulatory Visit: Payer: Medicare Other | Admitting: Occupational Therapy

## 2015-06-19 DIAGNOSIS — M79642 Pain in left hand: Secondary | ICD-10-CM

## 2015-06-19 DIAGNOSIS — M25632 Stiffness of left wrist, not elsewhere classified: Secondary | ICD-10-CM

## 2015-06-19 DIAGNOSIS — M25532 Pain in left wrist: Secondary | ICD-10-CM | POA: Diagnosis not present

## 2015-06-19 DIAGNOSIS — M25521 Pain in right elbow: Secondary | ICD-10-CM | POA: Diagnosis not present

## 2015-06-19 DIAGNOSIS — M25621 Stiffness of right elbow, not elsewhere classified: Secondary | ICD-10-CM

## 2015-06-19 DIAGNOSIS — M25531 Pain in right wrist: Secondary | ICD-10-CM | POA: Diagnosis not present

## 2015-06-19 DIAGNOSIS — R29898 Other symptoms and signs involving the musculoskeletal system: Secondary | ICD-10-CM

## 2015-06-19 NOTE — Addendum Note (Signed)
Addended by: Angelina Ok F on: 06/19/2015 11:29 AM   Modules accepted: Orders

## 2015-06-19 NOTE — Therapy (Signed)
[facility] [address] [place], [place], [address] Phone: [phone]   Fax:  [phone]  Occupational Therapy Treatment  Patient Details  Name: [name] MRN: [id number] Date of Birth: 12/18/1950 Referring Provider:  Norman Herrlich, MD  Encounter Date: 06/19/2015      OT End of Session - 06/19/15 0858    Visit Number 6   Number of Visits 16   Date for OT Re-Evaluation 07/12/15   Authorization Type [employer]HC Medicare/Medicaid, G-code needed   Authorization Time Period 05/13/15-07/12/15   Authorization - Visit Number 6   Authorization - Number of Visits 10   OT Start Time 0848   OT Stop Time 0930   OT Time Calculation (min) 42 min   Activity Tolerance Patient limited by pain   Behavior During Therapy [facility] for tasks assessed/performed      Past Medical History  Diagnosis Date  . Hypertension   . Peripheral neuropathy   . Cervicalgia   . Low back pain syndrome   . Hyperlipidemia   . Vitamin D deficiency   . Vitamin B12 deficiency   . Iliotibial band syndrome   . Anemia   . Borderline personality disorder   . Nonorganic psychosis   . GERD (gastroesophageal reflux disease)   . Diabetes mellitus   . Depression   . Diabetic gastroparesis   . CAD (coronary artery disease)     Catherization 11/18/09>nonobstructive CAD , normal LV function, EF 65%  . Health maintenance examination     Colonoscopy 2009> normal; Diabetic eye exam 12/2008. No diabetic retinopathy; Mammogram 11/10> No evidence of malignancy, DXA 03/14/13 : normal  . Headache(784.0)   . Arthritis   . SBO (small bowel obstruction) 01/09/2013  . Diastolic CHF     Grade I, on Echo 06/2013, EF 55-60%  . Sleep apnea     does not wear CPAP   . Diabetes mellitus, type II   . Wears glasses   . Wears dentures     upper  . Gout   . Right carpal tunnel syndrome 09/13/2014    Past Surgical History  Procedure Laterality Date  . Abdominal hysterectomy     . Hand surgery  1992    rt  . Bowel resection N/A 01/10/2013    Procedure: SMALL BOWEL RESECTION;  Surgeon: Madilyn Hook, DO;  Location: [facility];  Service: General;  Laterality: N/A;  . Lysis of adhesion N/A 01/10/2013    Procedure: LYSIS OF ADHESION;  Surgeon: Madilyn Hook, DO;  Location: [facility];  Service: General;  Laterality: N/A;  . Laparotomy N/A 01/10/2013    Procedure: EXPLORATORY LAPAROTOMY;  Surgeon: Madilyn Hook, DO;  Location: [facility];  Service: General;  Laterality: N/A;  . Incisional hernia repair N/A 03/01/2014    Procedure: LAPAROSCOPIC INCISIONAL HERNIA;  Surgeon: Ralene Ok, MD;  [facility];  Service: General;  Laterality: N/A;  . Insertion of mesh N/A 03/01/2014    Procedure: INSERTION OF MESH;  Surgeon: Ralene Ok, MD;  [facility];  Service: General;  Laterality: N/A;  . Hernia repair  03/01/14    Lap incisional hernia repair w/mesh  . Appendectomy    . Carpal tunnel release Right 09/13/2014    Procedure: RIGHT WRIST CARPAL TUNNEL RELEASE;  Surgeon: Johnny Bridge, MD;  Location: [facility];  Service: Orthopedics;  Laterality: Right;  . Steriod injection Left 09/13/2014    Procedure: STEROID INJECTION LEFT HAND;  Surgeon: Johnny Bridge, MD;  Location: [facility];  Service: Orthopedics;  Laterality: Left;  . Carpal tunnel release Left 02/28/2015    Procedure: LEFT CARPAL TUNNEL RELEASE;  Surgeon: Marchia Bond, MD;  Location: [facility];  Service: Orthopedics;  Laterality: Left;    There were no vitals filed for this visit.  Visit Diagnosis:  Right elbow pain  Stiffness of left wrist joint  Decreased grip strength  Elbow stiffness, right  Bilateral arm weakness  Pain of left hand      Subjective Assessment - 06/19/15 0855    Subjective  Pt reports that she cancelled appts due to daughter passing away suddenly and dealing with it, but feels that she could benefit from continuing OT due to continued pain.   Pt reports some medication changes, but unsure and plans to bring wrist next session.   Pertinent History R CTR 09/14/14, L CTR 02/28/15, R elbow pain begain after L CTR    Repetition Increases Symptoms   Patient Stated Goals improve pain and hand use   Currently in Pain? Yes   Pain Score 5    Pain Location Elbow  none at L wrist currently but has pain at times   Pain Orientation Right   Pain Descriptors / Indicators Aching  pulling   Pain Type Chronic pain   Aggravating Factors  movement    Pain Relieving Factors heat                      OT Treatments/Exercises (OP) - 06/19/15 0001    ADLs   Overall ADLs checked STGs (see goals section) and discussed progress.   Exercises   Exercises Wrist;Elbow   Elbow Exercises   Other elbow exercises Reviewed lateral epicondylitis AROM ex for RUE: #1 (elbow bent, forearm nutral with gentle wrist flex/ext) and #2 (forearm pronated with with gentle wrist flex/ext stretch)   Wrist Exercises   Other wrist exercises Gentle PROM wrist ext stretch L hand   Fine Motor Coordination   Tendon Glides x15 L hand, then isolated MP flex L hand   Ultrasound   Ultrasound Location L palm incision site/scar x68min followed by R proximal forearm (laterally) x33min   Ultrasound Parameters L hand:  3.47mhz, 20% pulsed, 0.8wts/cm2 x58min with no adverse reactions (with sound head warming on) for scar tissue/pain.  R elbow/promimal forearm:  3.62mhz, 1.0wts/cm2, continuous with soundhead warming on for pain/tightness with no adverse reactions.   Ultrasound Goals Pain;Other (Comment)  scar tissue, muscle tightness   Splinting   Splinting Pt fitted  for R counterforce brace and instructed in wear/care.                OT Education - 06/19/15 [phone]    Education Details counterforce band wear/care and use of removable hot/cold pack   Person(s) Educated Patient   Methods Explanation;Verbal cues   Comprehension Verbalized understanding          OT  Short Term Goals - 06/19/15 0903    OT SHORT TERM GOAL #1   Title Pt will be independent with initial HEP.--check STGs 06/12/15   Status On-going   OT SHORT TERM GOAL #2   Title Pt will report pain less than or equal to 5/10 in R elbow and L wrist/hand.   Status On-going  06/19/15:  improved but R elbow up to 8/10 at times with activity, up to 7/10 in L wrist with activity   OT SHORT TERM GOAL #3   Title Pt will  be independent with splint wear/care prn.   Status On-going  06/19/15:  pt has not been wearing last few days due to misplacing, will look again before next visit   OT [place] #4   Title Pt will verbalize understanding of AE/adaptive strategies for ADLs to incr ease/reduce pain prn.   Status On-going  06/19/15:  partially met, but needs review/further education   OT SHORT TERM GOAL #5   Title Pt will improve L wrist extension to at least 50 degrees for ADLs.   Status On-going  06/19/15:  40*           OT Long Term Goals - 05/13/15 2057    OT LONG TERM GOAL #1   Title Pt will be independent with updated HEP.--check LTGs 07/12/15   Time 8   Period Weeks   Status New   OT LONG TERM GOAL #2   Title Pt will report pain less than or equal to 5/10 in R elbow and L wrist/hand.   Time 8   Period Weeks   Status New   OT LONG TERM GOAL #3   Title Pt will improve UE functional use as shown by improving score on quick DASH to 35% or less.   Baseline 57.5%   Time 8   Period Weeks   Status New   OT LONG TERM GOAL #4   Title Pt will improve bilateral grip strength by at least 5lbs for ADLs.   Baseline R-32lbs, L-33lbs   Time 8   Period Weeks   Status New               Plan - 06/19/15 3220    Clinical Impression Statement Progress slowed due to family emergency and pt not being seen frequency or able to carry over HEP/recommendations during family emergency.  Pt would benefit from continued occupational therapy to address unmet goals.   Plan ultrasound to R  forearm and L palm, manual therapy for R forearm, review putty HEP   OT Home Exercise Plan issued:  05/19/15--HEP for RUE (wrist flex with elbow bent, heat, massage), LUE (tendon glides, scar massage). 05/21/15: review of all, splint wear and care; 05/26/15: AROM/PROM to L wrist in ext, lateral epicondylitis AROM ex #1-2. 05/28/15: Putty HEP for Lt hand (CTR protocol)   Consulted and Agree with Plan of Care Patient        Problem List Patient Active Problem List   Diagnosis Date Noted  . Preventative health care 06/18/2015  . Lateral epicondylitis of right elbow 04/17/2015  . Degenerative lumbar disc 12/30/2014  . Cough   . Degenerative joint disease involving multiple joints on both sides of body (right foot pain) 08/14/2014  . Chronic low back pain 04/15/2014  . Hot flashes 01/02/2014  . Diastolic CHF 25/42/7062  . Fibromyalgia syndrome 06/18/2013  . Abdominal pain, epigastric 03/29/2013  . Myalgia 12/21/2012  . Chronic chest pain 11/21/2012  . Carpal tunnel syndrome, bilateral 07/25/2012  . Tension headache, chronic 05/24/2012  . Iron deficiency anemia 09/22/2011  . Depression 07/14/2011  . Anxiety 06/02/2011  . Lumbar spondylosis with myelopathy 11/20/2010  . Coronary atherosclerosis 03/10/2010  . Dyslipidemia 12/11/2008  . Vitamin D deficiency 11/26/2008  . Uncontrolled type 2 diabetes mellitus with insulin therapy 05/25/2007  . PERIPHERAL NEUROPATHY 05/25/2007  . Essential hypertension 05/25/2007  . GERD 05/25/2007  . SYMPTOM, APNEA, SLEEP NOS 05/25/2007    [facility] 06/19/2015, 1:40 PM  [facility] [address]  [place], [place], [address] Phone: [phone]   Fax:  [place], OTR/L 06/19/2015 1:40 PM

## 2015-06-23 ENCOUNTER — Ambulatory Visit: Payer: Medicare Other | Admitting: Occupational Therapy

## 2015-06-23 ENCOUNTER — Encounter: Payer: Self-pay | Admitting: Occupational Therapy

## 2015-06-23 DIAGNOSIS — M25632 Stiffness of left wrist, not elsewhere classified: Secondary | ICD-10-CM

## 2015-06-23 DIAGNOSIS — M79642 Pain in left hand: Secondary | ICD-10-CM | POA: Diagnosis not present

## 2015-06-23 DIAGNOSIS — M25532 Pain in left wrist: Secondary | ICD-10-CM | POA: Diagnosis not present

## 2015-06-23 DIAGNOSIS — M25621 Stiffness of right elbow, not elsewhere classified: Secondary | ICD-10-CM

## 2015-06-23 DIAGNOSIS — M25531 Pain in right wrist: Secondary | ICD-10-CM | POA: Diagnosis not present

## 2015-06-23 DIAGNOSIS — M25521 Pain in right elbow: Secondary | ICD-10-CM | POA: Diagnosis not present

## 2015-06-23 DIAGNOSIS — R29898 Other symptoms and signs involving the musculoskeletal system: Secondary | ICD-10-CM | POA: Diagnosis not present

## 2015-06-23 NOTE — Progress Notes (Signed)
Internal Medicine Clinic Attending  Case discussed with Dr. Truong at the time of the visit.  We reviewed the resident's history and exam and pertinent patient test results.  I agree with the assessment, diagnosis, and plan of care documented in the resident's note.  

## 2015-06-23 NOTE — Therapy (Signed)
[employer] [address] [place], [place], [address] Phone: [phone]   Fax:  [phone]  Occupational Therapy Treatment  Patient Details  Name: [name] MRN: [id number] Date of Birth: November 21, 1950 Referring Provider:  Norman Herrlich, MD  Encounter Date: 06/23/2015      OT End of Session - 06/23/15 1110    Visit Number 7   Number of Visits 16   Date for OT Re-Evaluation 07/12/15   Authorization Type UHC Medicare/Medicaid, G-code needed   Authorization Time Period 05/13/15-07/12/15   Authorization - Visit Number 7   Authorization - Number of Visits 10   OT Start Time 0930   OT Stop Time 1013   OT Time Calculation (min) 43 min   Equipment Utilized During Treatment ultrasound   Activity Tolerance Patient tolerated treatment well      Past Medical History  Diagnosis Date  . Hypertension   . Peripheral neuropathy   . Cervicalgia   . Low back pain syndrome   . Hyperlipidemia   . Vitamin D deficiency   . Vitamin B12 deficiency   . Iliotibial band syndrome   . Anemia   . Borderline personality disorder   . Nonorganic psychosis   . GERD (gastroesophageal reflux disease)   . Diabetes mellitus   . Depression   . Diabetic gastroparesis   . CAD (coronary artery disease)     Catherization 11/18/09>nonobstructive CAD , normal LV function, EF 65%  . Health maintenance examination     Colonoscopy 2009> normal; Diabetic eye exam 12/2008. No diabetic retinopathy; Mammogram 11/10> No evidence of malignancy, DXA 03/14/13 : normal  . Headache(784.0)   . Arthritis   . SBO (small bowel obstruction) 01/09/2013  . Diastolic CHF     Grade I, on Echo 06/2013, EF 55-60%  . Sleep apnea     does not wear CPAP   . Diabetes mellitus, type II   . Wears glasses   . Wears dentures     upper  . Gout   . Right carpal tunnel syndrome 09/13/2014    Past Surgical History  Procedure Laterality Date  . Abdominal hysterectomy    .  Hand surgery  1992    rt  . Bowel resection N/A 01/10/2013    Procedure: SMALL BOWEL RESECTION;  Surgeon: Madilyn Hook, DO;  Location: [facility];  Service: General;  Laterality: N/A;  . Lysis of adhesion N/A 01/10/2013    Procedure: LYSIS OF ADHESION;  Surgeon: Madilyn Hook, DO;  Location: [facility];  Service: General;  Laterality: N/A;  . Laparotomy N/A 01/10/2013    Procedure: EXPLORATORY LAPAROTOMY;  Surgeon: Madilyn Hook, DO;  Location: [facility];  Service: General;  Laterality: N/A;  . Incisional hernia repair N/A 03/01/2014    Procedure: LAPAROSCOPIC INCISIONAL HERNIA;  Surgeon: Ralene Ok, MD;  Location: [facility];  Service: General;  Laterality: N/A;  . Insertion of mesh N/A 03/01/2014    Procedure: INSERTION OF MESH;  Surgeon: Ralene Ok, MD;  Location: [facility];  Service: General;  Laterality: N/A;  . Hernia repair  03/01/14    Lap incisional hernia repair w/mesh  . Appendectomy    . Carpal tunnel release Right 09/13/2014    Procedure: RIGHT WRIST CARPAL TUNNEL RELEASE;  Surgeon: Johnny Bridge, MD;  Location: [facility];  Service: Orthopedics;  Laterality: Right;  . Steriod injection Left 09/13/2014    Procedure: STEROID INJECTION LEFT HAND;  Surgeon: Johnny Bridge, MD;  Location: [facility];  Service: Orthopedics;  Laterality: Left;  . Carpal tunnel release Left 02/28/2015    Procedure: LEFT CARPAL TUNNEL RELEASE;  Surgeon: Marchia Bond, MD;  Location: [facility];  Service: Orthopedics;  Laterality: Left;    There were no vitals filed for this visit.  Visit Diagnosis:  Right elbow pain  Stiffness of left wrist joint  Elbow stiffness, right  Pain of left hand      Subjective Assessment - 06/23/15 0931    Subjective  I think the pain is definitely less and I am trying to do my exercises.    Pertinent History R CTR 09/14/14, L CTR 02/28/15, R elbow pain begain after L CTR    Repetition Increases Symptoms   Patient Stated Goals improve pain  and hand use   Currently in Pain? Yes   Pain Score 2    Pain Location Head   Pain Orientation Left   Pain Descriptors / Indicators Aching   Pain Type Chronic pain   Pain Onset More than a month ago   Pain Frequency Intermittent   Aggravating Factors  overuse hand   Pain Relieving Factors rest, heat,    Effect of Pain on Daily Activities driving, cooking and with ADL's                      OT Treatments/Exercises (OP) - 06/23/15 0001    ADLs   Overall ADLs education on important of HEP, working within pain limits and review of HEP   Exercises   Exercises Wrist;Elbow   Elbow Exercises   Other elbow exercises Pt able to do lateral epicondylitis exercises ( #1 elbow bent, forearm neutral, flexion/extension and #2, elbow bent, forearm pronated , wrist flexion/extension). Encouaged pt to work within limits of pain. Unable to progress pt just yet due to pain.     Wrist Exercises   Other wrist exercises Gentle PROM for wrist extension for L hand. Pt tolerated this better today.    Fine Motor Coordination   Tendon Glides x10, L hand, isolated MP flexion in L hand   Ultrasound   Ultrasound Location L palm incision site/scar x8 min followed by R proximal forearm (laterally) x8 min   Ultrasound Parameters L hand:  3.3 mhz, 20% pulsed, 0.8wts/cm2 x8 min no adverse reactions for scar tissue/pain.  R elbow/promixal forearm, 3.3 mhz, 1.0wts/cm2, continous for pain/tightness with no adverse reactions.     Ultrasound Goals Pain  scar tissue, muscle tightness                  OT Short Term Goals - 06/23/15 1101    OT SHORT TERM GOAL #1   Title Pt will be independent with initial HEP.--check STGs 06/12/15   Status On-going   OT SHORT TERM GOAL #2   Title Pt will report pain less than or equal to 5/10 in R elbow and L wrist/hand.   Status On-going  06/19/15:  improved but R elbow up to 8/10 at times with activity, up to 7/10 in L wrist with activity   OT SHORT TERM GOAL #3    Title Pt will be independent with splint wear/care prn.   Status On-going  06/19/15:  pt has not been wearing last few days due to misplacing, will look again before next visit   OT [place] #4   Title Pt will verbalize understanding of AE/adaptive strategies for ADLs to incr ease/reduce pain prn.   Status  On-going  06/19/15:  partially met, but needs review/further education   OT SHORT TERM GOAL #5   Title Pt will improve L wrist extension to at least 50 degrees for ADLs.   Status On-going  06/19/15:  40*           OT Long Term Goals - 06/23/15 1101    OT LONG TERM GOAL #1   Title Pt will be independent with updated HEP.--check LTGs 07/12/15   Time 8   Period Weeks   Status New   OT LONG TERM GOAL #2   Title Pt will report pain less than or equal to 5/10 in R elbow and L wrist/hand.   Time 8   Period Weeks   Status New   OT LONG TERM GOAL #3   Title Pt will improve UE functional use as shown by improving score on quick DASH to 35% or less.   Baseline 57.5%   Time 8   Period Weeks   Status New   OT LONG TERM GOAL #4   Title Pt will improve bilateral grip strength by at least 5lbs for ADLs.   Baseline R-32lbs, L-33lbs   Time 8   Period Weeks   Status New               Plan - 06/23/15 1107    Clinical Impression Statement Pt making slow progress toward goals. Pt reports less pain in both her R elbow and her L wrist however still has pain with exercises. Discussed working within her limits of pain and encouraged consistent completion of HEP if possible - pt has had significant events in her personal life.    Pt will benefit from skilled therapeutic intervention in order to improve on the following deficits (Retired) Decreased strength;Pain;Impaired UE functional use;Decreased scar mobility;Decreased knowledge of use of DME;Increased edema;Decreased range of motion;Decreased coordination   Rehab Potential Good   OT Frequency 2x / week   OT Duration 8 weeks    OT Treatment/Interventions Self-care/ADL training;Moist Heat;Fluidtherapy;DME and/or AE instruction;Splinting;Patient/family education;Therapeutic exercises;Contrast Bath;Ultrasound;Therapeutic exercise;Scar mobilization;Therapeutic activities;Passive range of motion;Neuromuscular education;Cryotherapy;Electrical Stimulation;Parrafin;Manual Therapy   Plan ultasound to R forearm and L palm, manual therapy for R forearm, advance exercises if possible.   OT Home Exercise Plan issued:  05/19/15--HEP for RUE (wrist flex with elbow bent, heat, massage), LUE (tendon glides, scar massage). 05/21/15: review of all, splint wear and care; 05/26/15: AROM/PROM to L wrist in ext, lateral epicondylitis AROM ex #1-2. 05/28/15: Putty HEP for Lt hand (CTR protocol)   Consulted and Agree with Plan of Care Patient        Problem List Patient Active Problem List   Diagnosis Date Noted  . Preventative health care 06/18/2015  . Lateral epicondylitis of right elbow 04/17/2015  . Degenerative lumbar disc 12/30/2014  . Cough   . Degenerative joint disease involving multiple joints on both sides of body (right foot pain) 08/14/2014  . Chronic low back pain 04/15/2014  . Hot flashes 01/02/2014  . Diastolic CHF 68/12/2120  . Fibromyalgia syndrome 06/18/2013  . Abdominal pain, epigastric 03/29/2013  . Myalgia 12/21/2012  . Chronic chest pain 11/21/2012  . Carpal tunnel syndrome, bilateral 07/25/2012  . Tension headache, chronic 05/24/2012  . Iron deficiency anemia 09/22/2011  . Depression 07/14/2011  . Anxiety 06/02/2011  . Lumbar spondylosis with myelopathy 11/20/2010  . Coronary atherosclerosis 03/10/2010  . Dyslipidemia 12/11/2008  . Vitamin D deficiency 11/26/2008  . Uncontrolled type 2 diabetes mellitus with insulin therapy 05/25/2007  .  PERIPHERAL NEUROPATHY 05/25/2007  . Essential hypertension 05/25/2007  . GERD 05/25/2007  . SYMPTOM, APNEA, SLEEP NOS 05/25/2007    Quay Burow,  OTR/L 06/23/2015, [facility] [address] [place], [place], [address] Phone: [phone]   Fax:  [phone]

## 2015-06-25 ENCOUNTER — Encounter: Payer: Self-pay | Admitting: Internal Medicine

## 2015-06-26 ENCOUNTER — Ambulatory Visit: Payer: Medicare Other | Admitting: Occupational Therapy

## 2015-06-27 ENCOUNTER — Telehealth: Payer: Self-pay | Admitting: Pharmacist

## 2015-06-27 ENCOUNTER — Ambulatory Visit (INDEPENDENT_AMBULATORY_CARE_PROVIDER_SITE_OTHER): Payer: Medicare Other | Admitting: Internal Medicine

## 2015-06-27 ENCOUNTER — Encounter: Payer: Self-pay | Admitting: Internal Medicine

## 2015-06-27 VITALS — BP 146/86 | HR 87 | Temp 98.0°F | Ht 65.0 in | Wt 181.1 lb

## 2015-06-27 DIAGNOSIS — M109 Gout, unspecified: Secondary | ICD-10-CM

## 2015-06-27 DIAGNOSIS — M10071 Idiopathic gout, right ankle and foot: Secondary | ICD-10-CM

## 2015-06-27 MED ORDER — PREDNISONE 20 MG PO TABS: 40.0000 mg | ORAL_TABLET | Freq: Every day | ORAL | Status: DC

## 2015-06-27 NOTE — Progress Notes (Signed)
   Subjective:    Patient ID: [name], female    DOB: 05/19/1951, 64 y.o.   MRN: [id number]  HPI 64 y/o F w/ PMHx of depression, CAD, dCHF, GERD, DM, and fibromyalgia who presents for new onset rt foot pain. Please see problem list for further details.      Review of Systems  Constitutional: Negative for fever and chills.  Respiratory: Negative for shortness of breath.   Cardiovascular: Negative for chest pain.  Gastrointestinal: Positive for abdominal pain (chronic).  Musculoskeletal:       Rt foot pain   Psychiatric/Behavioral:       Coping with recent death of daughter        Objective:   Physical Exam  Constitutional: She appears well-developed and well-nourished.  Cardiovascular: Normal rate, regular rhythm and normal heart sounds.   Pulmonary/Chest: Effort normal and breath sounds normal. No respiratory distress. She has no wheezes. She has no rales.  Abdominal: Soft. Bowel sounds are normal. She exhibits no distension.  Neurological: She is alert.  Skin:  Rt 1st MTP joint inflamed, erythematous, warm, and TTP          Assessment & Plan:  Please see problem based assessment and plan.

## 2015-06-27 NOTE — Telephone Encounter (Signed)
Steroid-Induced Hyperglycemia Prevention and Management [name] is a 64 y.o. female who meets criteria for [facility] quality improvement program (diabetes patient prescribed short courses of oral steroids).  A/P Current Regimen  Patient prescribed prednisone 40 mg daily x 4 days, starting on 06/28/15 in the morning.   Current DM regimen: insulin glargine + sitagliptin  Home BG Monitoring  Patient does check BG at home and does have a meter at home.  CBGs at home 100s, A1C prior to steroid course 6.1  Patient does not report s/sx of hyper- or hypoglycemia  Medication Management  Switch insulin glargine dose to morning  Due to holiday weekend, patient advised to increase insulin glargine on morning of 06/29/15 to 55 units if BG > 200 on 06/28/15 and morning of 06/29/15. If BG remains elevated, increase to 60 units on 06/30/15. Will call patient back on 07/01/15.  Patient Education  Advised patient to monitor BG while on steroid therapy (at least twice daily prior to first 2 meals of the day).  Patient educated about signs/symptoms and advised to contact clinic if hyper- or hypoglycemic.  Patient did  verbalize understanding of information and regimen by repeating back topics discussed.  Follow-up 07/01/15   Kim,Jennifer J 5:15 PM 06/27/2015

## 2015-06-27 NOTE — Patient Instructions (Signed)
Take prednisone  once daily for 4 days. Make sure to stay hydrated. We will see you in 1 month or sooner if your symptoms do not improve.

## 2015-06-30 DIAGNOSIS — M109 Gout, unspecified: Secondary | ICD-10-CM | POA: Insufficient documentation

## 2015-06-30 NOTE — Assessment & Plan Note (Addendum)
Pt complaining of rt 1st MTP pain that woke her up from sleep. Pain started 3 days ago with maximally intensity within the first 24 hours. Her 1st rt MTP joint was swollen, red, and warm. Shewas unable to put anything on it due to pain until yesterday when she was able to put a sock on at which time she called the clinic for an appt. She denies trauma or falls. She had a similar episode as this on the dorsal surface of her left foot last year and was prescribed a course of steroids. On exam her rt 1st MTP is erythematous, tender to palpation, and warm likely due to an acute gout flare. NSAIDs contraindicated due to renal failure.  - rx for prednisone  x 4 days - if more than 2 gout flares can consider gout prophylaxis

## 2015-07-01 ENCOUNTER — Ambulatory Visit: Payer: Medicare Other | Admitting: Occupational Therapy

## 2015-07-01 MED ORDER — PREDNISONE 20 MG PO TABS: 40.0000 mg | ORAL_TABLET | Freq: Every day | ORAL | Status: DC

## 2015-07-01 NOTE — Addendum Note (Signed)
Addended by: Erlinda Hong T on: 07/01/2015 09:19 AM   Modules accepted: Level of Service

## 2015-07-01 NOTE — Progress Notes (Signed)
Internal Medicine Clinic Attending  I saw and evaluated the patient.  I personally confirmed the key portions of the history and exam documented by Dr. Truong and I reviewed pertinent patient test results.  The assessment, diagnosis, and plan were formulated together and I agree with the documentation in the resident's note.  

## 2015-07-02 ENCOUNTER — Encounter: Payer: Self-pay | Admitting: Registered Nurse

## 2015-07-02 ENCOUNTER — Other Ambulatory Visit: Payer: Self-pay | Admitting: Registered Nurse

## 2015-07-02 ENCOUNTER — Encounter: Payer: Medicare Other | Attending: Physical Medicine & Rehabilitation | Admitting: Registered Nurse

## 2015-07-02 VITALS — BP 135/74 | HR 82 | Resp 14

## 2015-07-02 DIAGNOSIS — G894 Chronic pain syndrome: Secondary | ICD-10-CM | POA: Diagnosis not present

## 2015-07-02 DIAGNOSIS — M47817 Spondylosis without myelopathy or radiculopathy, lumbosacral region: Secondary | ICD-10-CM | POA: Diagnosis not present

## 2015-07-02 DIAGNOSIS — Z5181 Encounter for therapeutic drug level monitoring: Secondary | ICD-10-CM | POA: Insufficient documentation

## 2015-07-02 DIAGNOSIS — M5136 Other intervertebral disc degeneration, lumbar region: Secondary | ICD-10-CM

## 2015-07-02 DIAGNOSIS — Z79899 Other long term (current) drug therapy: Secondary | ICD-10-CM | POA: Insufficient documentation

## 2015-07-02 DIAGNOSIS — M4716 Other spondylosis with myelopathy, lumbar region: Secondary | ICD-10-CM | POA: Diagnosis not present

## 2015-07-02 MED ORDER — ACETAMINOPHEN-CODEINE #4 300-60 MG PO TABS: ORAL_TABLET | ORAL | Status: DC

## 2015-07-02 MED ORDER — ACETAMINOPHEN-CODEINE #4 300-60 MG PO TABS: 2.0000 | ORAL_TABLET | Freq: Three times a day (TID) | ORAL | Status: DC | PRN

## 2015-07-02 MED ORDER — ACETAMINOPHEN-CODEINE #4 300-60 MG PO TABS: 2.0000 | ORAL_TABLET | Freq: Four times a day (QID) | ORAL | Status: DC | PRN

## 2015-07-02 NOTE — Progress Notes (Signed)
Subjective:    Patient ID: [name], female    DOB: 05/31/1951, 64 y.o.   MRN: [id number]  HPI: [name] is a 64 year old female who returns for follow up for chronic pain and medication refill. She says her pain is located in her mid- lower back. Also states the pain in her lower back has intensified and she is not receiving any relief with the T&C#4. [name] was looking for something stronger to control her pain she states she can't even do her housework. Medication history reviewed. Dr. Wynn Banker recommended increasing her T&C #4 two tablets every 8 hours as needed, this was conveyed to Ms. Juran she verbalizes understanding. Instructed to call office with any questions she verbalizes understanding.She rates her pain 2. Her current exercise regime is attending physical therapy twice a week and walking.  Two scripts were discarded due to entry error.  Pain Inventory Average Pain 8 Pain Right Now 2 My pain is aching  In the last 24 hours, has pain interfered with the following? General activity 7 Relation with others 7 Enjoyment of life 7 What TIME of day is your pain at its worst? daytime Sleep (in general) Fair  Pain is worse with: walking, bending, sitting, standing and some activites Pain improves with: rest and medication Relief from Meds: 0  Mobility walk without assistance how many minutes can you walk? 40 ability to climb steps?  yes do you drive?  yes transfers alone  Function disabled: date disabled . I need assistance with the following:  household duties  Neuro/Psych depression anxiety  Prior Studies Any changes since last visit?  no  Physicians involved in your care Any changes since last visit?  no   Family History  Problem Relation Age of Onset  . Diabetes Mother   . Hypertension Mother   . Hyperlipidemia Mother   . Arthritis Mother   . Depression Mother   . Heart disease Father   . Diabetes Brother   . Heart disease Brother   .  Heart disease Paternal Grandmother   . Diabetes Brother    Social History   Social History  . Marital Status: Married    Spouse Name: N/A  . Number of Children: N/A  . Years of Education: N/A   Social History Main Topics  . Smoking status: Former Smoker    Types: Cigarettes    Quit date: 10/25/2002  . Smokeless tobacco: Never Used  . Alcohol Use: No  . Drug Use: No  . Sexual Activity: Not Currently   Other Topics Concern  . None   Social History Narrative   Past Surgical History  Procedure Laterality Date  . Abdominal hysterectomy    . Hand surgery  1992    rt  . Bowel resection N/A 01/10/2013    Procedure: SMALL BOWEL RESECTION;  Surgeon: Lodema Pilot, DO;  Location[facility];  Service: General;  Laterality: N/A;  . Lysis of adhesion N/A 01/10/2013    Procedure: LYSIS OF ADHESION;  Surgeon: Lodema Pilot, DO;  Location[facility];  Service: General;  Laterality: N/A;  . Laparotomy N/A 01/10/2013    Procedure: EXPLORATORY LAPAROTOMY;  Surgeon: Lodema Pilot, DO;  Location[facility];  Service: General;  Laterality: N/A;  . Incisional hernia repair N/A 03/01/2014    Procedure: LAPAROSCOPIC INCISIONAL HERNIA;  Surgeon: Axel Filler, MD;  Location[facility];  Service: General;  Laterality: N/A;  . Insertion of mesh N/A 03/01/2014    Procedure: INSERTION OF  MESH;  Surgeon: Axel Filler, MD;  Location[facility];  Service: General;  Laterality: N/A;  . Hernia repair  03/01/14    Lap incisional hernia repair w/mesh  . Appendectomy    . Carpal tunnel release Right 09/13/2014    Procedure: RIGHT WRIST CARPAL TUNNEL RELEASE;  Surgeon: Eulas Post, MD;  Location: [facility];  Service: Orthopedics;  Laterality: Right;  . Steriod injection Left 09/13/2014    Procedure: STEROID INJECTION LEFT HAND;  Surgeon: Eulas Post, MD;  Location: [facility];  Service: Orthopedics;  Laterality: Left;  . Carpal tunnel release Left 02/28/2015    Procedure: LEFT CARPAL TUNNEL  RELEASE;  Surgeon: Teryl Lucy, MD;  Location: [facility];  Service: Orthopedics;  Laterality: Left;   Past Medical History  Diagnosis Date  . Hypertension   . Peripheral neuropathy   . Cervicalgia   . Low back pain syndrome   . Hyperlipidemia   . Vitamin D deficiency   . Vitamin B12 deficiency   . Iliotibial band syndrome   . Anemia   . Borderline personality disorder   . Nonorganic psychosis   . GERD (gastroesophageal reflux disease)   . Diabetes mellitus   . Depression   . Diabetic gastroparesis   . CAD (coronary artery disease)     Catherization 11/18/09>nonobstructive CAD , normal LV function, EF 65%  . Health maintenance examination     Colonoscopy 2009> normal; Diabetic eye exam 12/2008. No diabetic retinopathy; Mammogram 11/10> No evidence of malignancy, DXA 03/14/13 : normal  . Headache(784.0)   . Arthritis   . SBO (small bowel obstruction) 01/09/2013  . Diastolic CHF     Grade I, on Echo 06/2013, EF 55-60%  . Sleep apnea     does not wear CPAP   . Diabetes mellitus, type II   . Wears glasses   . Wears dentures     upper  . Gout   . Right carpal tunnel syndrome 09/13/2014   BP 135/74 mmHg  Pulse 82  Resp 14  SpO2 98%  Opioid Risk Score:   Fall Risk Score:  `1  Depression screen PHQ 2/9  Depression screen [facility] 2/9 04/17/2015 03/20/2015 03/05/2015 02/10/2015 01/08/2015 11/07/2014 10/23/2014  Decreased Interest 0 0 0 1 0 2 3  Down, Depressed, Hopeless 0 0 0 PHQ - 2 Score 0 0 0 Altered sleeping - - - 0 - 1 1  Tired, decreased energy - - - 1 - 3 3  Change in appetite - - - 1 - 1 0  Feeling bad or failure about yourself  - - - 0 - 3 1  Trouble concentrating - - - 0 - 0 0  Moving slowly or fidgety/restless - - - 0 - 1 3  Suicidal thoughts - - - 0 - 1 1  PHQ-9 Score - - - 5 - 15 15     Review of Systems  Constitutional: Positive for appetite change and unexpected weight change.       Weight loss Poor appetite  Respiratory:  Positive for cough and wheezing.   Cardiovascular: Positive for leg swelling.  Gastrointestinal: Positive for nausea and abdominal pain.  Psychiatric/Behavioral: Positive for dysphoric mood. The patient is nervous/anxious.   All other systems reviewed and are negative.      Objective:   Physical Exam  Constitutional: She is oriented to person, place, and time. She appears well-developed and  well-nourished.  HENT:  Head: Normocephalic and atraumatic.  Neck: Normal range of motion. Neck supple.  Cardiovascular: Normal rate and regular rhythm.   Pulmonary/Chest: Effort normal and breath sounds normal.  Musculoskeletal:  Normal Muscle Bulk and Muscle Testing Reveals:  Upper Extremities: Full ROM and Muscle Strength on the right 3/5 and left  4/5 Thoracic Paraspinal Tenderness: T-10- T-12 Lumbar Paraspinal Tenderness: L-1- L-4 Lower Extremities: Full ROM and Muscle Strength on the right 4/5 and left  5/5 Antalgic Gait     Neurological: She is alert and oriented to person, place, and time.  Skin: Skin is warm and dry.  Psychiatric: She has a normal mood and affect.  Nursing note and vitals reviewed.         Assessment & Plan:  1. Lumbar spondylosis will eval/tx with Bilateral L3-4-5.  RX: Increased: Tylenol #4 two tablets every 8 hours as needed for moderate pain #180.  2. Diabetic Neuropathy: Continue Gabapentin and Tight Glucose Control.   20 minutes of face to face patient care time was spent during this visit. All questions were encouraged and answered.  F/U in 1 month

## 2015-07-03 ENCOUNTER — Ambulatory Visit (INDEPENDENT_AMBULATORY_CARE_PROVIDER_SITE_OTHER): Payer: 59 | Admitting: Clinical

## 2015-07-03 ENCOUNTER — Ambulatory Visit: Payer: Medicare Other | Attending: Internal Medicine | Admitting: Occupational Therapy

## 2015-07-03 ENCOUNTER — Other Ambulatory Visit (HOSPITAL_COMMUNITY): Payer: Self-pay | Admitting: Gastroenterology

## 2015-07-03 ENCOUNTER — Encounter: Payer: Self-pay | Admitting: Occupational Therapy

## 2015-07-03 ENCOUNTER — Encounter (HOSPITAL_COMMUNITY): Payer: Self-pay | Admitting: Clinical

## 2015-07-03 ENCOUNTER — Telehealth: Payer: Self-pay | Admitting: Pharmacist

## 2015-07-03 VITALS — BP 144/67 | HR 75

## 2015-07-03 DIAGNOSIS — M25621 Stiffness of right elbow, not elsewhere classified: Secondary | ICD-10-CM | POA: Insufficient documentation

## 2015-07-03 DIAGNOSIS — F331 Major depressive disorder, recurrent, moderate: Secondary | ICD-10-CM

## 2015-07-03 DIAGNOSIS — M6281 Muscle weakness (generalized): Secondary | ICD-10-CM | POA: Diagnosis not present

## 2015-07-03 DIAGNOSIS — M25632 Stiffness of left wrist, not elsewhere classified: Secondary | ICD-10-CM | POA: Diagnosis not present

## 2015-07-03 DIAGNOSIS — M25521 Pain in right elbow: Secondary | ICD-10-CM | POA: Diagnosis not present

## 2015-07-03 DIAGNOSIS — M79642 Pain in left hand: Secondary | ICD-10-CM | POA: Insufficient documentation

## 2015-07-03 DIAGNOSIS — R29898 Other symptoms and signs involving the musculoskeletal system: Secondary | ICD-10-CM | POA: Diagnosis not present

## 2015-07-03 DIAGNOSIS — R110 Nausea: Secondary | ICD-10-CM

## 2015-07-03 DIAGNOSIS — R1013 Epigastric pain: Secondary | ICD-10-CM

## 2015-07-03 LAB — PMP ALCOHOL METABOLITE (ETG): ETGU: NEGATIVE ng/mL

## 2015-07-03 NOTE — Telephone Encounter (Signed)
Contacted patient to help with medication management. Patient reports last dose of prednisone taken this morning. BG in the 130s, no signs/symptoms of hypo-/hyperglycemia. Gout symptoms improved.  Signing off of case at this time. Patient advised to contact clinic if symptoms worsen/persist. Patient verbalized understanding by repeating back concepts discussed.

## 2015-07-03 NOTE — Therapy (Signed)
[facility] [address] [place], [place], [address] Phone: [phone]   Fax:  [phone]  Occupational Therapy Treatment  Patient Details  Name: [name] MRN: [id number] Date of Birth: October 10, 1951 Referring Provider:  Norman Herrlich, MD  Encounter Date: 07/03/2015      OT End of Session - 07/03/15 1226    Visit Number 8   Number of Visits 16   Date for OT Re-Evaluation 07/12/15   Authorization Type UHC Medicare/Medicaid, G-code needed   OT Start Time 1017   OT Stop Time 1102   OT Time Calculation (min) 45 min   Activity Tolerance Patient tolerated treatment well      Past Medical History  Diagnosis Date  . Hypertension   . Peripheral neuropathy   . Cervicalgia   . Low back pain syndrome   . Hyperlipidemia   . Vitamin D deficiency   . Vitamin B12 deficiency   . Iliotibial band syndrome   . Anemia   . Borderline personality disorder   . Nonorganic psychosis   . GERD (gastroesophageal reflux disease)   . Diabetes mellitus   . Depression   . Diabetic gastroparesis   . CAD (coronary artery disease)     Catherization 11/18/09>nonobstructive CAD , normal LV function, EF 65%  . Health maintenance examination     Colonoscopy 2009> normal; Diabetic eye exam 12/2008. No diabetic retinopathy; Mammogram 11/10> No evidence of malignancy, DXA 03/14/13 : normal  . Headache(784.0)   . Arthritis   . SBO (small bowel obstruction) 01/09/2013  . Diastolic CHF     Grade I, on Echo 06/2013, EF 55-60%  . Sleep apnea     does not wear CPAP   . Diabetes mellitus, type II   . Wears glasses   . Wears dentures     upper  . Gout   . Right carpal tunnel syndrome 09/13/2014    Past Surgical History  Procedure Laterality Date  . Abdominal hysterectomy    . Hand surgery  1992    rt  . Bowel resection N/A 01/10/2013    Procedure: SMALL BOWEL RESECTION;  Surgeon: Madilyn Hook, DO;  Location: [facility];  Service: General;   Laterality: N/A;  . Lysis of adhesion N/A 01/10/2013    Procedure: LYSIS OF ADHESION;  Surgeon: Madilyn Hook, DO;  Location: [facility];  Service: General;  Laterality: N/A;  . Laparotomy N/A 01/10/2013    Procedure: EXPLORATORY LAPAROTOMY;  Surgeon: Madilyn Hook, DO;  Location: [facility];  Service: General;  Laterality: N/A;  . Incisional hernia repair N/A 03/01/2014    Procedure: LAPAROSCOPIC INCISIONAL HERNIA;  Surgeon: Ralene Ok, MD;  Location: [facility];  Service: General;  Laterality: N/A;  . Insertion of mesh N/A 03/01/2014    Procedure: INSERTION OF MESH;  Surgeon: Ralene Ok, MD;  Location: [facility];  Service: General;  Laterality: N/A;  . Hernia repair  03/01/14    Lap incisional hernia repair w/mesh  . Appendectomy    . Carpal tunnel release Right 09/13/2014    Procedure: RIGHT WRIST CARPAL TUNNEL RELEASE;  Surgeon: Johnny Bridge, MD;  Location: [facility];  Service: Orthopedics;  Laterality: Right;  . Steriod injection Left 09/13/2014    Procedure: STEROID INJECTION LEFT HAND;  Surgeon: Johnny Bridge, MD;  Location: [facility];  Service: Orthopedics;  Laterality: Left;  . Carpal tunnel release Left 02/28/2015    Procedure: LEFT CARPAL TUNNEL RELEASE;  Surgeon: Marchia Bond,  MD;  Location: [facility];  Service: Orthopedics;  Laterality: Left;    Filed Vitals:   07/03/15 1020  BP: 144/67  Pulse: 75    Visit Diagnosis:  Right elbow pain  Stiffness of left wrist joint  Elbow stiffness, right  Pain of left hand      Subjective Assessment - 07/03/15 1020    Subjective  I am feeling so much better - the pain is so much less   Pertinent History R CTR 09/14/14, L CTR 02/28/15, R elbow pain begain after L CTR    Repetition Increases Symptoms   Patient Stated Goals improve pain and hand use   Currently in Pain? Yes   Pain Score 1    Pain Location Elbow   Pain Orientation Right   Pain Descriptors / Indicators Aching   Pain Type Chronic  pain   Pain Onset More than a month ago   Pain Frequency Intermittent   Aggravating Factors  with wrist extension   Pain Relieving Factors avoid wrist extension, rest, heat   Multiple Pain Sites Yes   Pain Score 1   Pain Location Wrist   Pain Orientation Left   Pain Descriptors / Indicators Tender;Aching   Pain Type Chronic pain   Pain Onset More than a month ago   Pain Frequency Intermittent   Aggravating Factors  hitting against something, overuse, strong grip   Pain Relieving Factors avoid movements, rest, heat                      OT Treatments/Exercises (OP) - 07/03/15 0001    Exercises   Exercises Wrist;Elbow   Elbow Exercises   Other elbow exercises Pt with no pain at all today with epicondylitis tendon glides with elbow bent;  assuming no pain next session will progress pt to exercise #3 and #4 (elbow extended).  Pt reports overall she has very little pain at this point (1-2 max) and only gets elbow pain with end range R wrist extension and hand pain if she hits or wrist on something or loads L hand with hand in full wrist extension.    Ultrasound   Ultrasound Location R forearm/elbow followed by L wrist/palm both x8 min    Ultrasound Parameters L palm = 3.3 mhz, 20% pulsed, .8 wts/cm2, no adverse reactions.  R forearm/elbow 3.3 mhz, continuous 1.0 wt/cm2 with no adverse reactions.    Ultrasound Goals Pain;Other (Comment)  decrease stiffness and muscle tightness   Manual Therapy   Manual Therapy Soft tissue mobilization   Manual therapy comments Scar/soft tissue massage to Lt hand/wrist following ultrasound - pt with far less scar tissue and improved AROM (wrist extension to 55* with no pain).                    OT Short Term Goals - 07/03/15 1223    OT SHORT TERM GOAL #1   Title Pt will be independent with initial HEP.--check STGs 06/12/15   Status Achieved   OT SHORT TERM GOAL #2   Title Pt will report pain less than or equal to 5/10 in R elbow  and L wrist/hand.   Status Achieved  06/19/15:  improved but R elbow up to 8/10 at times with activity, up to 7/10 in L wrist with activity   OT SHORT TERM GOAL #3   Title Pt will be independent with splint wear/care prn.   Status Achieved  06/19/15:  pt has not been  wearing last few days due to misplacing, will look again before next visit   OT SHORT TERM GOAL #4   Title Pt will verbalize understanding of AE/adaptive strategies for ADLs to incr ease/reduce pain prn.   Status Achieved  06/19/15:  partially met, but needs review/further education   OT SHORT TERM GOAL #5   Title Pt will improve L wrist extension to at least 50 degrees for ADLs.  66* today   Status Achieved  06/19/15:  40*           OT Long Term Goals - 07/03/15 1223    OT LONG TERM GOAL #1   Title Pt will be independent with updated HEP.--check LTGs 07/12/15   Time 8   Period Weeks   Status On-going   OT LONG TERM GOAL #2   Title Pt will report pain less than or equal to 5/10 in R elbow and L wrist/hand.   Time 8   Period Weeks   Status Achieved   OT LONG TERM GOAL #3   Title Pt will improve UE functional use as shown by improving score on quick DASH to 35% or less.   Baseline 57.5%   Time 8   Period Weeks   Status On-going   OT LONG TERM GOAL #4   Title Pt will improve bilateral grip strength by at least 5lbs for ADLs.   Baseline R-32lbs, L-33lbs   Time 8   Period Weeks   Status On-going               Plan - 07/03/15 1224    Clinical Impression Statement Pt has significanty less pain in both L wrist and R elbow and reports much greater use of UE's at home.  Pt has met all STG's and working toward LTG's at this point.    Pt will benefit from skilled therapeutic intervention in order to improve on the following deficits (Retired) Decreased strength;Pain;Impaired UE functional use;Decreased scar mobility;Decreased knowledge of use of DME;Increased edema;Decreased range of motion;Decreased coordination    Rehab Potential Good   OT Frequency 2x / week   OT Duration 8 weeks   OT Treatment/Interventions Self-care/ADL training;Moist Heat;Fluidtherapy;DME and/or AE instruction;Splinting;Patient/family education;Therapeutic exercises;Contrast Bath;Ultrasound;Therapeutic exercise;Scar mobilization;Therapeutic activities;Passive range of motion;Neuromuscular education;Cryotherapy;Electrical Stimulation;Parrafin;Manual Therapy   Plan check remainng LTG's - assess strengthening program given less pain   OT Home Exercise Plan issued:  05/19/15--HEP for RUE (wrist flex with elbow bent, heat, massage), LUE (tendon glides, scar massage). 05/21/15: review of all, splint wear and care; 05/26/15: AROM/PROM to L wrist in ext, lateral epicondylitis AROM ex #1-2. 05/28/15: Putty HEP for Lt hand (CTR protocol)   Consulted and Agree with Plan of Care Patient        Problem List Patient Active Problem List   Diagnosis Date Noted  . Acute gout 06/30/2015  . Preventative health care 06/18/2015  . Lateral epicondylitis of right elbow 04/17/2015  . Degenerative lumbar disc 12/30/2014  . Cough   . Degenerative joint disease involving multiple joints on both sides of body (right foot pain) 08/14/2014  . Chronic low back pain 04/15/2014  . Hot flashes 01/02/2014  . Diastolic CHF 24/82/5003  . Fibromyalgia syndrome 06/18/2013  . Abdominal pain, epigastric 03/29/2013  . Myalgia 12/21/2012  . Chronic chest pain 11/21/2012  . Carpal tunnel syndrome, bilateral 07/25/2012  . Tension headache, chronic 05/24/2012  . Iron deficiency anemia 09/22/2011  . Depression 07/14/2011  . Anxiety 06/02/2011  . Lumbar spondylosis with myelopathy 11/20/2010  .  Coronary atherosclerosis 03/10/2010  . Dyslipidemia 12/11/2008  . Vitamin D deficiency 11/26/2008  . Uncontrolled type 2 diabetes mellitus with insulin therapy 05/25/2007  . PERIPHERAL NEUROPATHY 05/25/2007  . Essential hypertension 05/25/2007  . GERD 05/25/2007  . SYMPTOM,  APNEA, SLEEP NOS 05/25/2007    Quay Burow, OTR/L 07/03/2015, 12:30 PM  [facility] [address] [place], [place], [address] Phone: [phone]   Fax:  [phone]

## 2015-07-03 NOTE — Progress Notes (Signed)
   THERAPIST PROGRESS NOTE  Session Time: 7:59 -9:00  Participation Level: Active  Behavioral Response: CasualAlertDepressed  Type of Therapy: Individual Therapy  Treatment Goals addressed: Improve Psychiatric Symptoms, Calming Skills, Emotional Regulation Skills,   Interventions: Motivational Interviewing,  Grounding & Mindfulness Techniques  Summary: Lindalou Soltis is a 64 y.o. female who presents with Major Depressive Disorder.   Suicidal/Homicidal: No -without intent/plan  Therapist Response:  Nkenge met with clinician for an individual session. Inola shared about her psychiatric symptoms, her current life events and her homework. Corrinna shared that she had been very sad and depressed lately. She shared that her daughter was found dead 06-27-23. She shared that she had been sick off and on. When she hadn't heard from her daughter she thought that she was having another round of illness which she would recover from. She shared that her grandson (who has schizophrenia) had thought that someone had killed his mother. She shared that he went in the neighborhood and ended up in a shoot out with the police and will certainly be going to prison. Tanina shared that her grief for both losses weighs heavily on her. Cora shared about what she has been doing to take care of herself. She shared about attending church and taking calls from friends and family. Laneshia and clinician discussed the importance of continuing to be connected rather than isolating. Emalia shared about her grandson and that when she focuses on him she is able to have a break from her grief. Client and clinician practiced a grounding technique together. Victora agreed to practice her grounding and mindfulness techniques and to write in her gratitude journal daily until next session.  Plan: Return again in 1-2 weeks.  Diagnosis:     Axis I: Major Depressive Disorder   Lost daughter   Jerel Shepherd, LCSW 07/03/2015

## 2015-07-04 ENCOUNTER — Ambulatory Visit: Payer: Medicare Other | Admitting: Occupational Therapy

## 2015-07-04 ENCOUNTER — Encounter: Payer: Self-pay | Admitting: Occupational Therapy

## 2015-07-04 DIAGNOSIS — M25521 Pain in right elbow: Secondary | ICD-10-CM

## 2015-07-04 DIAGNOSIS — M25621 Stiffness of right elbow, not elsewhere classified: Secondary | ICD-10-CM | POA: Diagnosis not present

## 2015-07-04 DIAGNOSIS — R29898 Other symptoms and signs involving the musculoskeletal system: Secondary | ICD-10-CM | POA: Diagnosis not present

## 2015-07-04 DIAGNOSIS — M25632 Stiffness of left wrist, not elsewhere classified: Secondary | ICD-10-CM | POA: Diagnosis not present

## 2015-07-04 DIAGNOSIS — M6281 Muscle weakness (generalized): Secondary | ICD-10-CM | POA: Diagnosis not present

## 2015-07-04 DIAGNOSIS — M79642 Pain in left hand: Secondary | ICD-10-CM | POA: Diagnosis not present

## 2015-07-04 NOTE — Therapy (Signed)
[facility] [address] [place], [place], [address] Phone: [phone]   Fax:  [phone]  Occupational Therapy Treatment  Patient Details  Name: [name] MRN: [id number] Date of Birth: Oct 14, 1951 Referring Provider:  Norman Herrlich, MD  Encounter Date: 07/04/2015      OT End of Session - 07/04/15 1603    Visit Number 9   Number of Visits 16   Date for OT Re-Evaluation 07/12/15   Authorization Type UHC Medicare/Medicaid, G-code needed   Authorization - Visit Number 9   Authorization - Number of Visits 10   OT Start Time 1400   OT Stop Time 1443   OT Time Calculation (min) 43 min   Activity Tolerance Patient tolerated treatment well      Past Medical History  Diagnosis Date  . Hypertension   . Peripheral neuropathy   . Cervicalgia   . Low back pain syndrome   . Hyperlipidemia   . Vitamin D deficiency   . Vitamin B12 deficiency   . Iliotibial band syndrome   . Anemia   . Borderline personality disorder   . Nonorganic psychosis   . GERD (gastroesophageal reflux disease)   . Diabetes mellitus   . Depression   . Diabetic gastroparesis   . CAD (coronary artery disease)     Catherization 11/18/09>nonobstructive CAD , normal LV function, EF 65%  . Health maintenance examination     Colonoscopy 2009> normal; Diabetic eye exam 12/2008. No diabetic retinopathy; Mammogram 11/10> No evidence of malignancy, DXA 03/14/13 : normal  . Headache(784.0)   . Arthritis   . SBO (small bowel obstruction) 01/09/2013  . Diastolic CHF     Grade I, on Echo 06/2013, EF 55-60%  . Sleep apnea     does not wear CPAP   . Diabetes mellitus, type II   . Wears glasses   . Wears dentures     upper  . Gout   . Right carpal tunnel syndrome 09/13/2014    Past Surgical History  Procedure Laterality Date  . Abdominal hysterectomy    . Hand surgery  1992    rt  . Bowel resection N/A 01/10/2013    Procedure: SMALL BOWEL  RESECTION;  Surgeon: Madilyn Hook, DO;  Location: [facility];  Service: General;  Laterality: N/A;  . Lysis of adhesion N/A 01/10/2013    Procedure: LYSIS OF ADHESION;  Surgeon: Madilyn Hook, DO;  Location: [facility];  Service: General;  Laterality: N/A;  . Laparotomy N/A 01/10/2013    Procedure: EXPLORATORY LAPAROTOMY;  Surgeon: Madilyn Hook, DO;  Location: [facility];  Service: General;  Laterality: N/A;  . Incisional hernia repair N/A 03/01/2014    Procedure: LAPAROSCOPIC INCISIONAL HERNIA;  Surgeon: Ralene Ok, MD;  Location: [facility];  Service: General;  Laterality: N/A;  . Insertion of mesh N/A 03/01/2014    Procedure: INSERTION OF MESH;  Surgeon: Ralene Ok, MD;  Location: [facility];  Service: General;  Laterality: N/A;  . Hernia repair  03/01/14    Lap incisional hernia repair w/mesh  . Appendectomy    . Carpal tunnel release Right 09/13/2014    Procedure: RIGHT WRIST CARPAL TUNNEL RELEASE;  Surgeon: Johnny Bridge, MD;  Location: [facility];  Service: Orthopedics;  Laterality: Right;  . Steriod injection Left 09/13/2014    Procedure: STEROID INJECTION LEFT HAND;  Surgeon: Johnny Bridge, MD;  Location: [facility];  Service: Orthopedics;  Laterality: Left;  . Carpal tunnel  release Left 02/28/2015    Procedure: LEFT CARPAL TUNNEL RELEASE;  Surgeon: Marchia Bond, MD;  Location: [facility];  Service: Orthopedics;  Laterality: Left;    There were no vitals filed for this visit.  Visit Diagnosis:  Right elbow pain  Elbow stiffness, right  Muscle weakness (generalized)      Subjective Assessment - 07/04/15 1410    Subjective  It is easier to take care of my great grandson with less pain   Pertinent History R CTR 09/14/14, L CTR 02/28/15, R elbow pain begain after L CTR    Repetition Increases Symptoms   Patient Stated Goals improve pain and hand use   Currently in Pain? Yes   Pain Score 1    Pain Location Elbow   Pain Orientation Right   Pain  Descriptors / Indicators Aching   Pain Type Chronic pain   Pain Onset More than a month ago   Pain Frequency Intermittent   Aggravating Factors  with end wrist extension   Pain Relieving Factors avoid wrist extension, rest, heat                      OT Treatments/Exercises (OP) - 07/04/15 0001    ADLs   Overall ADLs Checked goals - see goal update . Pt has met or exceeded all goals however remains with weakness in R hand (dominant hand) and can benefit from Phase III of epicondylitis protocol for increasing overall strength in RUE.   Exercises   Exercises Elbow   Elbow Exercises   Other elbow exercises Able to progress pt to exercise #3 and #4 in Phase II for lateral epicondylitis (pt was able to do elbow bent, forearm in neutral and then pronated with wrist flexion #1 and #2 without pain last session and this session. Progressed to add same exercises with elbow extension).    Ultrasound   Ultrasound Location R elbow   Ultrasound Parameters 3.3 hmz, continuous, 1.0 wts/cm2.  x8 minutes no adverse reactions.    Ultrasound Goals Pain;Other (Comment)  reduce stiffness prior to exercises.                OT Education - 07/04/15 1551    Education provided Yes   Education Details #3 and #4 of Phase II for lateral epicondylitis   Person(s) Educated Patient   Methods Explanation;Demonstration;Verbal cues;Tactile cues;Handout   Comprehension Verbalized understanding;Returned demonstration          OT Short Term Goals - 07/04/15 1552    OT SHORT TERM GOAL #1   Title Pt will be independent with initial HEP.--check STGs 06/12/15   Status Achieved   OT SHORT TERM GOAL #2   Title Pt will report pain less than or equal to 5/10 in R elbow and L wrist/hand.   Status Achieved  06/19/15:  improved but R elbow up to 8/10 at times with activity, up to 7/10 in L wrist with activity   OT SHORT TERM GOAL #3   Title Pt will be independent with splint wear/care prn.   Status  Achieved  06/19/15:  pt has not been wearing last few days due to misplacing, will look again before next visit   OT [place] #4   Title Pt will verbalize understanding of AE/adaptive strategies for ADLs to incr ease/reduce pain prn.   Status Achieved  06/19/15:  partially met, but needs review/further education   OT SHORT TERM GOAL #5   Title Pt will  improve L wrist extension to at least 50 degrees for ADLs.  22* today   Status Achieved  06/19/15:  40*           OT Long Term Goals - 07/04/15 1552    OT LONG TERM GOAL #1   Title Pt will be independent with updated HEP.--check LTGs 07/12/15   Time 8   Period Weeks   Status On-going   OT LONG TERM GOAL #2   Title Pt will report pain less than or equal to 5/10 in R elbow and L wrist/hand.   Time 8   Period Weeks   Status Achieved   OT LONG TERM GOAL #3   Title Pt will improve UE functional use as shown by improving score on quick DASH to 35% or less.   Baseline 57.5%   Time 8   Period Weeks   Status Achieved  current score = 9% impaired   OT LONG TERM GOAL #4   Title Pt will improve bilateral grip strength in R dominant hand to at least 55 pounds to assist with functional tasks such as opening jars (new baseline = 50# on 06/03/2015)   Baseline R-32lbs, L-33lbs   Time 8   Period Weeks   Status Revised  R= 50 pounds, L= 60 pounds. Pt is  R handed               Plan - 07/04/15 1555    Clinical Impression Statement Pt making excellent progress. Pt has no pain L wrist at this point and only 1/10 in R elbow. Able to progress pt with exercises today. Pt does remain with weaker R grasp (dominant hand).   Pt will benefit from skilled therapeutic intervention in order to improve on the following deficits (Retired) Decreased strength;Pain;Impaired UE functional use;Decreased scar mobility;Decreased knowledge of use of DME;Increased edema;Decreased range of motion;Decreased coordination   Rehab Potential Good   OT Frequency  2x / week   OT Duration 8 weeks   OT Treatment/Interventions Self-care/ADL training;Moist Heat;Fluidtherapy;DME and/or AE instruction;Splinting;Patient/family education;Therapeutic exercises;Contrast Bath;Ultrasound;Therapeutic exercise;Scar mobilization;Therapeutic activities;Passive range of motion;Neuromuscular education;Cryotherapy;Electrical Stimulation;Parrafin;Manual Therapy   Plan check upgraded HEP, assess strengthening program for R hand   OT Home Exercise Plan issued:  05/19/15--HEP for RUE (wrist flex with elbow bent, heat, massage), LUE (tendon glides, scar massage). 05/21/15: review of all, splint wear and care; 05/26/15: AROM/PROM to L wrist in ext, lateral epicondylitis AROM ex #1-2. 05/28/15: Putty HEP for Lt hand (CTR protocol)   Consulted and Agree with Plan of Care Patient        Problem List Patient Active Problem List   Diagnosis Date Noted  . Acute gout 06/30/2015  . Preventative health care 06/18/2015  . Lateral epicondylitis of right elbow 04/17/2015  . Degenerative lumbar disc 12/30/2014  . Cough   . Degenerative joint disease involving multiple joints on both sides of body (right foot pain) 08/14/2014  . Chronic low back pain 04/15/2014  . Hot flashes 01/02/2014  . Diastolic CHF 36/62/9476  . Fibromyalgia syndrome 06/18/2013  . Abdominal pain, epigastric 03/29/2013  . Myalgia 12/21/2012  . Chronic chest pain 11/21/2012  . Carpal tunnel syndrome, bilateral 07/25/2012  . Tension headache, chronic 05/24/2012  . Iron deficiency anemia 09/22/2011  . Depression 07/14/2011  . Anxiety 06/02/2011  . Lumbar spondylosis with myelopathy 11/20/2010  . Coronary atherosclerosis 03/10/2010  . Dyslipidemia 12/11/2008  . Vitamin D deficiency 11/26/2008  . Uncontrolled type 2 diabetes mellitus with insulin therapy 05/25/2007  .  PERIPHERAL NEUROPATHY 05/25/2007  . Essential hypertension 05/25/2007  . GERD 05/25/2007  . SYMPTOM, APNEA, SLEEP NOS 05/25/2007    Quay Burow, OTR/L 07/04/2015, 4:05 PM  [facility] [address] [place], [place], [address] Phone: [phone]   Fax:  [phone]

## 2015-07-04 NOTE — Patient Instructions (Signed)
Added exercises #3 and #4 for Phase II of lateral epicondylitis   #3 - elbow extended, forearm in neutral, with wrist flexion, hold for a count of 15 with 10 reps #4 - elbow extended, forearm in pronation, with wrist flexion, hold for a count of 15 with 10 reps.

## 2015-07-07 ENCOUNTER — Ambulatory Visit: Payer: Medicare Other | Admitting: Occupational Therapy

## 2015-07-07 LAB — BENZODIAZEPINES (GC/LC/MS), URINE
ALPRAZOLAMU: NEGATIVE ng/mL (ref <25)
Clonazepam metabolite (GC/LC/MS), ur confirm: 514 ng/mL (ref <25)
Flurazepam metabolite (GC/LC/MS), ur confirm: NEGATIVE ng/mL (ref <50)
LORAZEPAMU: NEGATIVE ng/mL (ref <50)
MIDAZOLAMU: NEGATIVE ng/mL (ref <50)
Nordiazepam (GC/LC/MS), ur confirm: NEGATIVE ng/mL (ref <50)
Oxazepam (GC/LC/MS), ur confirm: NEGATIVE ng/mL (ref <50)
TEMAZEPAMU: NEGATIVE ng/mL (ref <50)
TRIAZOLAMU: NEGATIVE ng/mL (ref <50)

## 2015-07-07 LAB — FENTANYL (GC/LC/MS), URINE
Fentanyl, confirm: NEGATIVE ng/mL (ref <0.5)
Norfentanyl, confirm: NEGATIVE ng/mL (ref <0.5)

## 2015-07-08 LAB — PRESCRIPTION MONITORING PROFILE (SOLSTAS)
AMPHETAMINE/METH: NEGATIVE ng/mL
BUPRENORPHINE, URINE: NEGATIVE ng/mL
Barbiturate Screen, Urine: NEGATIVE ng/mL
CARISOPRODOL, URINE: NEGATIVE ng/mL
CREATININE, URINE: 200.49 mg/dL (ref >20.0)
Cannabinoid Scrn, Ur: NEGATIVE ng/mL
Cocaine Metabolites: NEGATIVE ng/mL
ECSTASY: NEGATIVE ng/mL
MEPERIDINE UR: NEGATIVE ng/mL
Methadone Screen, Urine: NEGATIVE ng/mL
Nitrites, Initial: NEGATIVE ug/mL
OPIATE SCREEN, URINE: NEGATIVE ng/mL
OXYCODONE SCRN UR: NEGATIVE ng/mL
PH URINE, INITIAL: 5.5 pH (ref 4.5–8.9)
Propoxyphene: NEGATIVE ng/mL
TRAMADOL UR: NEGATIVE ng/mL
Tapentadol, urine: NEGATIVE ng/mL
Zolpidem, Urine: NEGATIVE ng/mL

## 2015-07-09 ENCOUNTER — Ambulatory Visit (HOSPITAL_COMMUNITY): Payer: Self-pay | Admitting: Psychiatry

## 2015-07-10 ENCOUNTER — Ambulatory Visit: Payer: Medicare Other | Admitting: Occupational Therapy

## 2015-07-11 ENCOUNTER — Encounter (HOSPITAL_COMMUNITY)
Admission: RE | Admit: 2015-07-11 | Discharge: 2015-07-11 | Disposition: A | Discharge status: Home / Self Care | Payer: Medicare Other | Source: Ambulatory Visit | Attending: Gastroenterology | Admitting: Gastroenterology

## 2015-07-11 DIAGNOSIS — R1013 Epigastric pain: Secondary | ICD-10-CM

## 2015-07-11 DIAGNOSIS — R110 Nausea: Secondary | ICD-10-CM | POA: Diagnosis not present

## 2015-07-11 DIAGNOSIS — R140 Abdominal distension (gaseous): Secondary | ICD-10-CM | POA: Diagnosis not present

## 2015-07-11 MED ORDER — TECHNETIUM TC 99M SULFUR COLLOID
2.0000 | Freq: Once | INTRAVENOUS | Status: DC | PRN
  Administered 2015-07-11: 2.14 via ORAL
  Filled 2015-07-11: qty 2

## 2015-07-11 NOTE — Progress Notes (Signed)
Urine drug screen for this encounter is consistent for prescribed medication 

## 2015-07-19 ENCOUNTER — Other Ambulatory Visit: Payer: Self-pay | Admitting: Internal Medicine

## 2015-07-21 ENCOUNTER — Ambulatory Visit: Payer: Medicare Other | Admitting: Occupational Therapy

## 2015-07-21 ENCOUNTER — Telehealth: Payer: Self-pay | Admitting: Occupational Therapy

## 2015-07-21 ENCOUNTER — Encounter: Payer: Self-pay | Admitting: Occupational Therapy

## 2015-07-21 NOTE — Telephone Encounter (Signed)
Pt missed appointment today. Called to confirm that she planned to return to therapy as pt has not been here since 07/10/2015. Pt stated she knew she had an appointment tomorrow but not today and that she had been out of town. Pt confirmed that she will be here tomorrow for 11:45 appointment with OT.

## 2015-07-22 ENCOUNTER — Ambulatory Visit: Payer: Medicare Other | Admitting: Occupational Therapy

## 2015-07-22 ENCOUNTER — Encounter: Payer: Self-pay | Admitting: Occupational Therapy

## 2015-07-22 DIAGNOSIS — M25621 Stiffness of right elbow, not elsewhere classified: Secondary | ICD-10-CM

## 2015-07-22 DIAGNOSIS — R29898 Other symptoms and signs involving the musculoskeletal system: Secondary | ICD-10-CM

## 2015-07-22 DIAGNOSIS — M6281 Muscle weakness (generalized): Secondary | ICD-10-CM | POA: Diagnosis not present

## 2015-07-22 DIAGNOSIS — M25632 Stiffness of left wrist, not elsewhere classified: Secondary | ICD-10-CM

## 2015-07-22 DIAGNOSIS — M25521 Pain in right elbow: Secondary | ICD-10-CM

## 2015-07-22 DIAGNOSIS — M79642 Pain in left hand: Secondary | ICD-10-CM | POA: Diagnosis not present

## 2015-07-22 NOTE — Patient Instructions (Signed)
Pt has progressed to Phase III for lateral epicondylitis. Exercises are as follows:  Moist heat for 10 minutes prior to exercise.  Do 3-4 times per day, 10 repetitions and hold for count of 15.  #1 (is #4 exercise from Phase II) - elbow straight, palm down, actively bend wrist.  Phase III  Elbow bent, forearm in neutral, passively stretch wrist into flexion Elbow bent, palm facing floor, passively stretch wrist into flexion.

## 2015-07-22 NOTE — Therapy (Signed)
[facility] [address] [place], [place], [address] Phone: [phone]   Fax:  [phone]  Occupational Therapy Treatment  Patient Details  Name: [name] MRN: [id number] Date of Birth: [birth year] Referring Provider:  Norman Herrlich, MD  Encounter Date: 07/22/2015      OT End of Session - 07/22/15 1256    Visit Number 10   Number of Visits 16   Date for OT Re-Evaluation 08/19/15   Authorization Type [employer] Medicare/Medicaid, G-code needed, progress note needed every 1th visit   Authorization Time Period 07/22/2015 - 09/16/2015   Authorization - Visit Number 10   Authorization - Number of Visits 10   OT Start Time 1147   OT Stop Time 1230   OT Time Calculation (min) 43 min   Activity Tolerance Patient tolerated treatment well      Past Medical History  Diagnosis Date  . Hypertension   . Peripheral neuropathy   . Cervicalgia   . Low back pain syndrome   . Hyperlipidemia   . Vitamin D deficiency   . Vitamin B12 deficiency   . Iliotibial band syndrome   . Anemia   . Borderline personality disorder   . Nonorganic psychosis   . GERD (gastroesophageal reflux disease)   . Diabetes mellitus   . Depression   . Diabetic gastroparesis   . CAD (coronary artery disease)     Catherization 11/18/09>nonobstructive CAD , normal LV function, EF 65%  . Health maintenance examination     Colonoscopy 2009> normal; Diabetic eye exam 12/2008. No diabetic retinopathy; Mammogram 11/10> No evidence of malignancy, DXA 03/14/13 : normal  . Headache(784.0)   . Arthritis   . SBO (small bowel obstruction) 01/09/2013  . Diastolic CHF     Grade I, on Echo 06/2013, EF 55-60%  . Sleep apnea     does not wear CPAP   . Diabetes mellitus, type II   . Wears glasses   . Wears dentures     upper  . Gout   . Right carpal tunnel syndrome 09/13/2014    Past Surgical History  Procedure Laterality Date  . Abdominal hysterectomy    .  Hand surgery  1992    rt  . Bowel resection N/A 01/10/2013    Procedure: SMALL BOWEL RESECTION;  Surgeon: Madilyn Hook, DO;  Location: [facility];  Service: General;  Laterality: N/A;  . Lysis of adhesion N/A 01/10/2013    Procedure: LYSIS OF ADHESION;  Surgeon: Madilyn Hook, DO;  Location: [facility];  Service: General;  Laterality: N/A;  . Laparotomy N/A 01/10/2013    Procedure: EXPLORATORY LAPAROTOMY;  Surgeon: Madilyn Hook, DO;  Location: [facility];  Service: General;  Laterality: N/A;  . Incisional hernia repair N/A 03/01/2014    Procedure: LAPAROSCOPIC INCISIONAL HERNIA;  Surgeon: Ralene Ok, MD;  Location: [facility];  Service: General;  Laterality: N/A;  . Insertion of mesh N/A 03/01/2014    Procedure: INSERTION OF MESH;  Surgeon: Ralene Ok, MD;  Location: [facility];  Service: General;  Laterality: N/A;  . Hernia repair  03/01/14    Lap incisional hernia repair w/mesh  . Appendectomy    . Carpal tunnel release Right 09/13/2014    Procedure: RIGHT WRIST CARPAL TUNNEL RELEASE;  Surgeon: Johnny Bridge, MD;  Location: [facility];  Service: Orthopedics;  Laterality: Right;  . Steriod injection Left 09/13/2014    Procedure: STEROID INJECTION LEFT HAND;  Surgeon: Johnny Bridge, MD;  Location:  [facility];  Service: Orthopedics;  Laterality: Left;  . Carpal tunnel release Left 02/28/2015    Procedure: LEFT CARPAL TUNNEL RELEASE;  Surgeon: Marchia Bond, MD;  Location: [facility];  Service: Orthopedics;  Laterality: Left;    There were no vitals filed for this visit.  Visit Diagnosis:  Elbow stiffness, right  Right elbow pain  Muscle weakness (generalized)  Stiffness of left wrist joint  Pain of left hand  Decreased grip strength      Subjective Assessment - 07/22/15 1148    Subjective  I went on vacation to [facility]   Pertinent History R CTR 09/14/14, L CTR 02/28/15, R elbow pain begain after L CTR    Repetition Increases Symptoms   Patient  Stated Goals improve pain and hand use   Currently in Pain? No/denies                      OT Treatments/Exercises (OP) - 07/22/15 0001    Elbow Exercises   Other elbow exercises Pt progressed to Phase III of lateal epicondylitis protocol (inclusion of pasive stretch)  Added #1 and #2 to HEP. If pt tolerates well this week will add #3 and #4 next visit.  Pt also instructed in ight theraputty program to strengthen R hand - pt instructed to ramp this up slowly, wear band and stop if theraputty inflames her elbow in any way. Pt able to return demonstrate all changes to HEP and verbalize understanding. Pt also given instructions in writing.                OT Education - 07/22/15 1249    Education provided Yes   Education Details # 1 and #2 of Phase III of lateral epidcondylitis protocol as well as light theraputty program for R hand   Person(s) Educated Patient   Methods Explanation;Demonstration;Tactile cues;Verbal cues;Handout   Comprehension Verbalized understanding;Returned demonstration          OT Short Term Goals - 07/22/15 1250    OT SHORT TERM GOAL #1   Title Pt will be independent with initial HEP.--check STGs 06/12/15   Status Achieved   OT SHORT TERM GOAL #2   Title Pt will report pain less than or equal to 5/10 in R elbow and L wrist/hand.   Status Achieved  06/19/15:  improved but R elbow up to 8/10 at times with activity, up to 7/10 in L wrist with activity   OT SHORT TERM GOAL #3   Title Pt will be independent with splint wear/care prn.   Status Achieved  06/19/15:  pt has not been wearing last few days due to misplacing, will look again before next visit   OT [place] #4   Title Pt will verbalize understanding of AE/adaptive strategies for ADLs to incr ease/reduce pain prn.   Status Achieved  06/19/15:  partially met, but needs review/further education   OT SHORT TERM GOAL #5   Title Pt will improve L wrist extension to at least 50 degrees  for ADLs.  54* today   Status Achieved  06/19/15:  40*           OT Long Term Goals - 07/22/15 1250    OT LONG TERM GOAL #1   Title Pt will be independent with updated HEP.--08/19/2015  pt has missed several appointemnts due personal issues therefore will renew with current unmet goals as ongoing.   Time 8   Period Weeks  Status On-going   OT LONG TERM GOAL #2   Title Pt will report pain less than or equal to 5/10 in R elbow and L wrist/hand.   Time 8   Period Weeks   Status Achieved   OT LONG TERM GOAL #3   Title Pt will improve UE functional use as shown by improving score on quick DASH to 35% or less.   Baseline 57.5%   Time 8   Period Weeks   Status Achieved  current score = 9% impaired   OT LONG TERM GOAL #4   Title Pt will improve bilateral grip strength in R dominant hand to at least 55 pounds to assist with functional tasks such as opening jars (new baseline = 50# on 06/03/2015)   Baseline R-32lbs, L-33lbs   Time 8   Period Weeks   Status On-going  R= 50 pounds, L= 60 pounds. Pt is  R handed               Plan - 2015/08/18 1252    Clinical Impression Statement Pt is making slow progress toward goals. Pt has missed several appointments due to ongoing personal issues therefore will renew wtih current unmet goals as ongoing. Pt reports no pain again today therefore progressed to Phase III of protocol.    Pt will benefit from skilled therapeutic intervention in order to improve on the following deficits (Retired) Decreased strength;Pain;Impaired UE functional use;Decreased scar mobility;Decreased knowledge of use of DME;Increased edema;Decreased range of motion;Decreased coordination   OT Frequency 2x / week   OT Duration 8 weeks   OT Treatment/Interventions Self-care/ADL training;Moist Heat;Fluidtherapy;DME and/or AE instruction;Splinting;Patient/family education;Therapeutic exercises;Contrast Bath;Ultrasound;Therapeutic exercise;Scar mobilization;Therapeutic  activities;Passive range of motion;Neuromuscular education;Cryotherapy;Electrical Stimulation;Parrafin;Manual Therapy   Plan check HEP, if tolerating add #3 and #4 exercises of Phase III   OT Home Exercise Plan issued:  05/19/15--HEP for RUE (wrist flex with elbow bent, heat, massage), LUE (tendon glides, scar massage). 05/21/15: review of all, splint wear and care; 05/26/15: AROM/PROM to L wrist in ext, lateral epicondylitis AROM ex #1-2. 05/28/15: Putty HEP for Lt hand (CTR protocol)   Consulted and Agree with Plan of Care Patient          G-Codes - 08/18/15 1259    Functional Limitation Carrying, moving and handling objects   Carrying, Moving and Handling Objects Current Status (H9622) At least 40 percent but less than 60 percent impaired, limited or restricted   Carrying, Moving and Handling Objects Goal Status (W9798) At least 20 percent but less than 40 percent impaired, limited or restricted      Problem List Patient Active Problem List   Diagnosis Date Noted  . Acute gout 06/30/2015  . Preventative health care 06/18/2015  . Lateral epicondylitis of right elbow 04/17/2015  . Degenerative lumbar disc 12/30/2014  . Cough   . Degenerative joint disease involving multiple joints on both sides of body (right foot pain) 08/14/2014  . Chronic low back pain 04/15/2014  . Hot flashes 01/02/2014  . Diastolic CHF 92/08/9416  . Fibromyalgia syndrome 06/18/2013  . Abdominal pain, epigastric 03/29/2013  . Myalgia 12/21/2012  . Chronic chest pain 11/21/2012  . Carpal tunnel syndrome, bilateral 07/25/2012  . Tension headache, chronic 05/24/2012  . Iron deficiency anemia 09/22/2011  . Depression 07/14/2011  . Anxiety 06/02/2011  . Lumbar spondylosis with myelopathy 11/20/2010  . Coronary atherosclerosis 03/10/2010  . Dyslipidemia 12/11/2008  . Vitamin D deficiency 11/26/2008  . Uncontrolled type 2 diabetes mellitus with insulin therapy 05/25/2007  .  PERIPHERAL NEUROPATHY 05/25/2007  .  Essential hypertension 05/25/2007  . GERD 05/25/2007  . SYMPTOM, APNEA, SLEEP NOS 05/25/2007   Occupational Therapy Progress Note  Dates of Reporting Period: 05/13/2015 to 07/22/2015  Objective Reports of Subjective Statement: Pt has missed several appointments due to personal issues therefore will renew with unmet LTG's as ongoing. Pt has made good progress and is now pain free and has started last phase (Phase III) of protocol.  Objective Measurements: See above goals and current note.  Goal Update: See above goals - pt has met 5/5 STG's and 2/4 LTG's  Plan: renew POC as pt has missed several appointments due to personal issues.   Reason Skilled Services are Required: complete protocol, reach point of ability to strengthen R forearm to prevent re-occurrence.  Quay Burow, OTR/L 07/22/2015, 1:06 PM  [facility] [address] [place], [place], [address] Phone: [phone]   Fax:  [phone]

## 2015-07-24 ENCOUNTER — Ambulatory Visit (INDEPENDENT_AMBULATORY_CARE_PROVIDER_SITE_OTHER): Payer: Medicaid Other | Admitting: Clinical

## 2015-07-24 ENCOUNTER — Encounter (HOSPITAL_COMMUNITY): Payer: Self-pay | Admitting: Clinical

## 2015-07-24 DIAGNOSIS — F331 Major depressive disorder, recurrent, moderate: Secondary | ICD-10-CM | POA: Diagnosis not present

## 2015-07-24 NOTE — Progress Notes (Signed)
THERAPIST PROGRESS NOTE  Session Time: 8:00  Participation Level: Active  Behavioral Response: NeatAlertDepressed  Type of Therapy: Individual Therapy  Treatment Goals addressed: Improve Psychiatric Symptoms, Calming Skills, Emotional Regulation Skills, Interpersonal Relationship Skills  Interventions: Motivational Interviewing  Summary: [name] is a 64 y.o. female who presents with Major Depressive Disorder.   Suicidal/Homicidal: No -without intent/plan  Therapist Response:  [name] met with clinician for an individual session. [name] shared about her psychiatric symptoms, her current life events and her homework. [name] shared that she has been experiencing both depression and peace. She shared that her and her husband had gone to the beach for vacation and also to release the ashes of her daughter who died recently and unexpectedly. [name] shared that she had spent the whole vacation thinking a bout her daughter and working to remember the positives and not to focus just on her loss. She shared that her husband was extremely supportive and kind. She shared that she also worked to be very supportive of her husband. She shared that both of them worked to not fall in the trap of old arguments while on vacation in honoring their daughter. Client and clinician discussed that choice. [name] had the insight that she could continue to improve in that area. She shared she felt happier when they were not fighting. She shared about the ways that she honored her daughter. She shared that while on vacation she was able to do her homework. She discussed her grounding and mindfulness techniques . She shared that keeping a gratitude journal helped her to regulate her emotions.  [name] shared that she was glad to have her gratitude journal with her because it helped her to balance her grief out with her joy of having a really good vacation with her husband. [name] also shared that she talked to her grandson in  jail. She shared with him her boundaries about visiting and what she would be willing to do and not do for him. She shared that this also broke her heart to see him behind bars and likely to be gone for a long time. Client and clinician discussed the importance of setting healthy boundaries and of  loving. [name] agreed to continue her homework until next session.  Plan: Return again in 1-2 weeks.  Diagnosis:     Axis I: Major Depressive Disorder   Powell,Frances A, LCSW 07/24/2015  30 works

## 2015-07-25 ENCOUNTER — Ambulatory Visit (INDEPENDENT_AMBULATORY_CARE_PROVIDER_SITE_OTHER): Payer: 59 | Admitting: Psychiatry

## 2015-07-25 DIAGNOSIS — F331 Major depressive disorder, recurrent, moderate: Secondary | ICD-10-CM | POA: Diagnosis not present

## 2015-07-25 MED ORDER — CLONAZEPAM 1 MG PO TABS: ORAL_TABLET | ORAL | Status: DC

## 2015-07-25 NOTE — Progress Notes (Signed)
[facility] MD Progress Note  07/25/2015 12:14 PM [name]  MRN:  [id number] Subjective:  Not good Principal Problem: Major depression, chronic, mild Diagnosis:  Major depression, chronic mild Today the patient had a disaster. One month ago for 65 year old daughter died. She has insulin-dependent diabetes and apparently had an acute reaction of some nature. She died in her bathroom. The patient's grandson was also shot but survived and is in prison. Noted is the patient's son is also present area patient feels very stressed out. She has multiple responsibilities. Amazingly she is still sleeping and still eating. She feels very depressed. She is going to go to hospice. She still is in therapy here on a regular basis. Her last visit we increased her Zoloft 100 mg in conjunction with Wellbutrin 450. The patient still enjoys some things she watches TV. The patient however is overwhelmed with multiple financial responsibilities for her children. Her husband and her are stable. He is very supportive. She feels very anxious and overwhelmed.  Patient Active Problem List   Diagnosis Date Noted  . Acute gout [M10.9] 06/30/2015  . Preventative health care [Z00.00] 06/18/2015  . Lateral epicondylitis of right elbow [M77.11] 04/17/2015  . Degenerative lumbar disc [M51.36] 12/30/2014  . Cough [R05]   . Degenerative joint disease involving multiple joints on both sides of body (right foot pain) [M15.9] 08/14/2014  . Chronic low back pain [M54.5, G89.29] 04/15/2014  . Hot flashes [N95.1] 01/02/2014  . Diastolic CHF [I50.30] 07/11/2013  . Fibromyalgia syndrome [M79.7] 06/18/2013  . Abdominal pain, epigastric [R10.13] 03/29/2013  . Myalgia [M79.1] 12/21/2012  . Chronic chest pain [R07.9, G89.29] 11/21/2012  . Carpal tunnel syndrome, bilateral [G56.01, G56.02] 07/25/2012  . Tension headache, chronic [G44.229] 05/24/2012  . Iron deficiency anemia [D50.9] 09/22/2011  . Depression [F32.9] 07/14/2011  . Anxiety  [F41.9] 06/02/2011  . Lumbar spondylosis with myelopathy [M47.16] 11/20/2010  . Coronary atherosclerosis [I25.10] 03/10/2010  . Dyslipidemia [E78.5] 12/11/2008  . Vitamin D deficiency [E55.9] 11/26/2008  . Uncontrolled type 2 diabetes mellitus with insulin therapy [E11.65] 05/25/2007  . PERIPHERAL NEUROPATHY [G60.9] 05/25/2007  . Essential hypertension [I10] 05/25/2007  . GERD [K21.9] 05/25/2007  . SYMPTOM, APNEA, SLEEP NOS [G47.30] 05/25/2007   Total Time spent with patient: 30 minutes  Past Psychiatric History:   Past Medical History:  Past Medical History  Diagnosis Date  . Hypertension   . Peripheral neuropathy   . Cervicalgia   . Low back pain syndrome   . Hyperlipidemia   . Vitamin D deficiency   . Vitamin B12 deficiency   . Iliotibial band syndrome   . Anemia   . Borderline personality disorder   . Nonorganic psychosis   . GERD (gastroesophageal reflux disease)   . Diabetes mellitus   . Depression   . Diabetic gastroparesis   . CAD (coronary artery disease)     Catherization 11/18/09>nonobstructive CAD , normal LV function, EF 65%  . Health maintenance examination     Colonoscopy 2009> normal; Diabetic eye exam 12/2008. No diabetic retinopathy; Mammogram 11/10> No evidence of malignancy, DXA 03/14/13 : normal  . Headache(784.0)   . Arthritis   . SBO (small bowel obstruction) 01/09/2013  . Diastolic CHF     Grade I, on Echo 06/2013, EF 55-60%  . Sleep apnea     does not wear CPAP   . Diabetes mellitus, type II   . Wears glasses   . Wears dentures     upper  . Gout   . Right carpal  tunnel syndrome 09/13/2014    Past Surgical History  Procedure Laterality Date  . Abdominal hysterectomy    . Hand surgery  1992    rt  . Bowel resection N/A 01/10/2013    Procedure: SMALL BOWEL RESECTION;  Surgeon: Lodema Pilot, DO;  Location: [facility];  Service: General;  Laterality: N/A;  . Lysis of adhesion N/A 01/10/2013    Procedure: LYSIS OF ADHESION;  Surgeon: Lodema Pilot,  DO;  Location: [facility];  Service: General;  Laterality: N/A;  . Laparotomy N/A 01/10/2013    Procedure: EXPLORATORY LAPAROTOMY;  Surgeon: Lodema Pilot, DO;  Location[facility];  Service: General;  Laterality: N/A;  . Incisional hernia repair N/A 03/01/2014    Procedure: LAPAROSCOPIC INCISIONAL HERNIA;  Surgeon: Axel Filler, MD;  Location: [facility];  Service: General;  Laterality: N/A;  . Insertion of mesh N/A 03/01/2014    Procedure: INSERTION OF MESH;  Surgeon: Axel Filler, MD;  Location: [facility];  Service: General;  Laterality: N/A;  . Hernia repair  03/01/14    Lap incisional hernia repair w/mesh  . Appendectomy    . Carpal tunnel release Right 09/13/2014    Procedure: RIGHT WRIST CARPAL TUNNEL RELEASE;  Surgeon: Eulas Post, MD;  Location: [facility];  Service: Orthopedics;  Laterality: Right;  . Steriod injection Left 09/13/2014    Procedure: STEROID INJECTION LEFT HAND;  Surgeon: Eulas Post, MD;  Location: [facility];  Service: Orthopedics;  Laterality: Left;  . Carpal tunnel release Left 02/28/2015    Procedure: LEFT CARPAL TUNNEL RELEASE;  Surgeon: Teryl Lucy, MD;  Location: [facility];  Service: Orthopedics;  Laterality: Left;   Family History:  Family History  Problem Relation Age of Onset  . Diabetes Mother   . Hypertension Mother   . Hyperlipidemia Mother   . Arthritis Mother   . Depression Mother   . Heart disease Father   . Diabetes Brother   . Heart disease Brother   . Heart disease Paternal Grandmother   . Diabetes Brother    Family Psychiatric  History:  Social History:  History  Alcohol Use No     History  Drug Use No    Social History   Social History  . Marital Status: Married    Spouse Name: N/A  . Number of Children: N/A  . Years of Education: N/A   Social History Main Topics  . Smoking status: Former Smoker    Types: Cigarettes    Quit date: 10/25/2002  . Smokeless tobacco: Never Used  . Alcohol  Use: No  . Drug Use: No  . Sexual Activity: Not Currently   Other Topics Concern  . Not on file   Social History Narrative   Additional Social History:                         Sleep: Good  Appetite:  Good  Current Medications: Current Outpatient Prescriptions  Medication Sig Dispense Refill  . ACCU-CHEK AVIVA PLUS test strip TEST UPTO 4 TIMES DAILY 125 each 5  . ACCU-CHEK FASTCLIX LANCETS MISC TEST five times a day 102 each 11  . acetaminophen-codeine (TYLENOL #4) 300-60 MG per tablet Take 2 tablets by mouth every 8 (eight) hours as needed for moderate pain. 180 tablet 2  . albuterol (PROVENTIL HFA;VENTOLIN HFA) 108 (90 BASE) MCG/ACT inhaler Inhale 2 puffs into the lungs every 6 (six) hours as needed for wheezing or shortness  of breath. 1 Inhaler 2  . aspirin 81 MG chewable tablet Chew 81 mg by mouth daily.    . BD PEN NEEDLE NANO U/F 32G X 4 MM MISC USE UPTO 5 TIMES DAILY 100 each 4  . buPROPion (WELLBUTRIN XL) 150 MG 24 hr tablet Take 3 tablets (450 mg total) by mouth daily. 270 tablet 1  . clonazePAM (KLONOPIN) 1 MG tablet 1 tid 90 tablet 3  . cloNIDine (CATAPRES - DOSED IN MG/24 HR) 0.3 mg/24hr patch Place 1 patch (0.3 mg total) onto the skin every 7 (seven) days. 4 patch 11  . dexlansoprazole (DEXILANT) 60 MG capsule Take 1 capsule (60 mg total) by mouth daily. For reflux 30 capsule   . diclofenac sodium (VOLTAREN) 1 % GEL Apply 2 g topically 4 (four) times daily. 3 Tube 1  . Insulin Glargine (LANTUS SOLOSTAR) 100 UNIT/ML Solostar Pen Inject 51 Units into the skin at bedtime. 45 mL 4  . isosorbide mononitrate (IMDUR) 60 MG 24 hr tablet Take 1 tablet (60 mg total) by mouth every morning. 90 tablet 4  . losartan (COZAAR) 50 MG tablet Take 1 tablet (50 mg total) by mouth daily. 90 tablet 4  . metoprolol succinate (TOPROL-XL) 25 MG 24 hr tablet Take 1 tablet (25 mg total) by mouth every morning. 90 tablet 4  . ondansetron (ZOFRAN) 4 MG tablet Take 4 mg by mouth every 8  (eight) hours as needed for nausea.     . predniSONE (DELTASONE) 20 MG tablet Take 2 tablets (40 mg total) by mouth daily with breakfast. 4 tablet 0  . rosuvastatin (CRESTOR) 10 MG tablet Take 1 tablet (10 mg total) by mouth at bedtime. 30 tablet 11  . sertraline (ZOLOFT) 100 MG tablet Take 1 tablet (100 mg total) by mouth daily. 90 tablet 1  . simethicone (GAS-X) 80 MG chewable tablet Chew 1 tablet (80 mg total) by mouth 4 (four) times daily as needed for flatulence. 100 tablet 2  . sitaGLIPtin (JANUVIA) 50 MG tablet Take 1 tablet (50 mg total) by mouth daily. 90 tablet 4  . traZODone (DESYREL) 50 MG tablet Take 1 tablet (50 mg total) by mouth at bedtime. 90 tablet 1   No current facility-administered medications for this visit.    Lab Results: No results found. However, due to the size of the patient record, not all encounters were searched. Please check Results Review for a complete set of results.  Physical Findings: AIMS:  , ,  ,  ,    CIWA:    COWS:     Musculoskeletal: Strength & Muscle Tone: within normal limits Gait & Station: normal Patient leans: Right  Psychiatric Specialty Exam: ROS  There were no vitals taken for this visit.There is no weight on file to calculate BMI.  General Appearance: Casual  Eye Contact::  Good  Speech:  Clear and Coherent  Volume:  Normal  Mood:  Depressed  Affect:  Appropriate  Thought Process:  Coherent  Orientation:  Full (Time, Place, and Person)  Thought Content:  WDL  Suicidal Thoughts:  No  Homicidal Thoughts:  No  Memory:  NA  Judgement:  Good  Insight:  Good  Psychomotor Activity:  Normal  Concentration:  Good  Recall:  Fair  Fund of Knowledge:Good  Language: Fair  Akathisia:  No  Handed:  Right  AIMS (if indicated):     Assets:  Desire for Improvement  ADL's:  Intact  Cognition: WNL  Sleep:  Treatment Plan Summary: Medication management at this time we will go ahead and increase her Klonopin from 2 mg a day to  dose of 1 mg tablet taken 3 times a day. The patient will continue on 100 mg of Zoloft for the time being. She'll continue on Wellbutrin 450 and continue in therapy. The patient will also start hospice therapy. This patient is not suicidal. The patient denies shortness of breath or chest pain. Her #1 problem seems to be that of breathing. Going to hospice counseling is excellent. It is good that she has an ongoing relationship with a therapist here. The patient was instructed that if she was oversedated from a higher dose of Klonopin to cutting down from 3 pills back to original 2 pills. This patient she'll return to see me in 6 weeks. He  PLOVSKY, GERALD IRVING 07/25/2015, 12:14 PM

## 2015-07-28 ENCOUNTER — Ambulatory Visit: Payer: Medicare Other | Admitting: Occupational Therapy

## 2015-07-29 ENCOUNTER — Ambulatory Visit: Payer: Medicare Other | Attending: Internal Medicine | Admitting: Occupational Therapy

## 2015-07-29 DIAGNOSIS — M25621 Stiffness of right elbow, not elsewhere classified: Secondary | ICD-10-CM | POA: Insufficient documentation

## 2015-07-29 DIAGNOSIS — M79642 Pain in left hand: Secondary | ICD-10-CM | POA: Insufficient documentation

## 2015-07-29 DIAGNOSIS — R29898 Other symptoms and signs involving the musculoskeletal system: Secondary | ICD-10-CM | POA: Insufficient documentation

## 2015-07-29 DIAGNOSIS — M25632 Stiffness of left wrist, not elsewhere classified: Secondary | ICD-10-CM | POA: Insufficient documentation

## 2015-07-29 DIAGNOSIS — M6281 Muscle weakness (generalized): Secondary | ICD-10-CM | POA: Insufficient documentation

## 2015-07-29 DIAGNOSIS — M25521 Pain in right elbow: Secondary | ICD-10-CM | POA: Diagnosis not present

## 2015-07-29 NOTE — Therapy (Signed)
[facility] [address] [place], [place], [address] Phone: [phone]   Fax:  [phone]  Occupational Therapy Treatment  Patient Details  Name: [name] MRN: [id number] Date of Birth: July 02, 1951 Referring Provider:  Norman Herrlich, MD  Encounter Date: 07/29/2015      OT End of Session - 07/29/15 1617    Visit Number 11   Number of Visits 16   Date for OT Re-Evaluation 08/19/15   Authorization Type UHC Medicare/Medicaid, G-code needed, progress note needed every 1th visit   Authorization Time Period 07/22/2015 - 09/16/2015   Authorization - Visit Number 11   Authorization - Number of Visits 20   OT Start Time 7622   OT Stop Time 1400   OT Time Calculation (min) 43 min   Equipment Utilized During Treatment moist heat right elbow   Activity Tolerance Patient tolerated treatment well   Behavior During Therapy [facility] for tasks assessed/performed      Past Medical History  Diagnosis Date  . Hypertension   . Peripheral neuropathy   . Cervicalgia   . Low back pain syndrome   . Hyperlipidemia   . Vitamin D deficiency   . Vitamin B12 deficiency   . Iliotibial band syndrome   . Anemia   . Borderline personality disorder   . Nonorganic psychosis   . GERD (gastroesophageal reflux disease)   . Diabetes mellitus   . Depression   . Diabetic gastroparesis   . CAD (coronary artery disease)     Catherization 11/18/09>nonobstructive CAD , normal LV function, EF 65%  . Health maintenance examination     Colonoscopy 2009> normal; Diabetic eye exam 12/2008. No diabetic retinopathy; Mammogram 11/10> No evidence of malignancy, DXA 03/14/13 : normal  . Headache(784.0)   . Arthritis   . SBO (small bowel obstruction) 01/09/2013  . Diastolic CHF     Grade I, on Echo 06/2013, EF 55-60%  . Sleep apnea     does not wear CPAP   . Diabetes mellitus, type II   . Wears glasses   . Wears dentures     upper  . Gout   . Right  carpal tunnel syndrome 09/13/2014    Past Surgical History  Procedure Laterality Date  . Abdominal hysterectomy    . Hand surgery  1992    rt  . Bowel resection N/A 01/10/2013    Procedure: SMALL BOWEL RESECTION;  Surgeon: Madilyn Hook, DO;  Location: [facility];  Service: General;  Laterality: N/A;  . Lysis of adhesion N/A 01/10/2013    Procedure: LYSIS OF ADHESION;  Surgeon: Madilyn Hook, DO;  Location: [facility];  Service: General;  Laterality: N/A;  . Laparotomy N/A 01/10/2013    Procedure: EXPLORATORY LAPAROTOMY;  Surgeon: Madilyn Hook, DO;  Location: [facility];  Service: General;  Laterality: N/A;  . Incisional hernia repair N/A 03/01/2014    Procedure: LAPAROSCOPIC INCISIONAL HERNIA;  Surgeon: Ralene Ok, MD;  Location: [facility];  Service: General;  Laterality: N/A;  . Insertion of mesh N/A 03/01/2014    Procedure: INSERTION OF MESH;  Surgeon: Ralene Ok, MD;  Location: [facility];  Service: General;  Laterality: N/A;  . Hernia repair  03/01/14    Lap incisional hernia repair w/mesh  . Appendectomy    . Carpal tunnel release Right 09/13/2014    Procedure: RIGHT WRIST CARPAL TUNNEL RELEASE;  Surgeon: Johnny Bridge, MD;  Location: [facility];  Service: Orthopedics;  Laterality: Right;  . Steriod  injection Left 09/13/2014    Procedure: STEROID INJECTION LEFT HAND;  Surgeon: Eulas Post, MD;  Location: [facility];  Service: Orthopedics;  Laterality: Left;  . Carpal tunnel release Left 02/28/2015    Procedure: LEFT CARPAL TUNNEL RELEASE;  Surgeon: Teryl Lucy, MD;  Location: [facility];  Service: Orthopedics;  Laterality: Left;    There were no vitals filed for this visit.  Visit Diagnosis:  Elbow stiffness, right  Muscle weakness (generalized)      Subjective Assessment - 07/29/15 1328    Subjective  I feel a little detached today.  I t might be my new medicine.     Pertinent History R CTR 09/14/14, L CTR 02/28/15, R elbow pain begain after L  CTR    Repetition Increases Symptoms   Patient Stated Goals improve pain and hand use   Currently in Pain? No/denies   Pain Score 0-No pain   Pain Location Elbow   Pain Orientation Right                      OT Treatments/Exercises (OP) - 07/29/15 0001    Elbow Exercises   Other elbow exercises Added exercise 3 and 4 from lateral epicondylitis protocol phase III.  Patient able to safely complete with minimal cueing .     Other elbow exercises Very light resistance 1/2 lb weight to right arm, elbow as per protocol.  Patient does not have weights at home, but felt she could use a water bottle for light resistance.   Moist Heat Therapy   Number Minutes Moist Heat 10 Minutes   Moist Heat Location Elbow   Manual Therapy   Myofascial Release Myofascial release to right elbow to address tension lateral elbow.  Soft tissue mobilization                OT Education - 07/29/15 1616    Education provided Yes   Education Details #3 and #4 Phase III Lateral epicondyle protocol as well as light strengthening   Person(s) Educated Patient   Methods Explanation;Demonstration;Verbal cues;Handout   Comprehension Verbalized understanding;Returned demonstration          OT Short Term Goals - 07/22/15 1250    OT SHORT TERM GOAL #1   Title Pt will be independent with initial HEP.--check STGs 06/12/15   Status Achieved   OT SHORT TERM GOAL #2   Title Pt will report pain less than or equal to 5/10 in R elbow and L wrist/hand.   Status Achieved  06/19/15:  improved but R elbow up to 8/10 at times with activity, up to 7/10 in L wrist with activity   OT SHORT TERM GOAL #3   Title Pt will be independent with splint wear/care prn.   Status Achieved  06/19/15:  pt has not been wearing last few days due to misplacing, will look again before next visit   OT SHORT TERM GOAL #4   Title Pt will verbalize understanding of AE/adaptive strategies for ADLs to incr ease/reduce pain prn.    Status Achieved  06/19/15:  partially met, but needs review/further education   OT SHORT TERM GOAL #5   Title Pt will improve L wrist extension to at least 50 degrees for ADLs.  55* today   Status Achieved  06/19/15:  40*           OT Long Term Goals - 07/22/15 1250    OT LONG TERM GOAL #1  Title Pt will be independent with updated HEP.--08/19/2015  pt has missed several appointemnts due personal issues therefore will renew with current unmet goals as ongoing.   Time 8   Period Weeks   Status On-going   OT LONG TERM GOAL #2   Title Pt will report pain less than or equal to 5/10 in R elbow and L wrist/hand.   Time 8   Period Weeks   Status Achieved   OT LONG TERM GOAL #3   Title Pt will improve UE functional use as shown by improving score on quick DASH to 35% or less.   Baseline 57.5%   Time 8   Period Weeks   Status Achieved  current score = 9% impaired   OT LONG TERM GOAL #4   Title Pt will improve bilateral grip strength in R dominant hand to at least 55 pounds to assist with functional tasks such as opening jars (new baseline = 50# on 06/03/2015)   Baseline R-32lbs, L-33lbs   Time 8   Period Weeks   Status On-going  R= 50 pounds, L= 60 pounds. Pt is  R handed               Plan - 07/29/15 1619    Clinical Impression Statement Patient is tolerating her exercise program, and has progressed to Phase III lat epicondyle program.  Patient with minimal report of pain, although has tightness, and decreased strength which are slowly improving.   Pt will benefit from skilled therapeutic intervention in order to improve on the following deficits (Retired) Decreased strength;Pain;Impaired UE functional use;Decreased scar mobility;Decreased knowledge of use of DME;Increased edema;Decreased range of motion;Decreased coordination   Rehab Potential Good   OT Frequency 2x / week   OT Duration 8 weeks   OT Treatment/Interventions Self-care/ADL training;Moist  Heat;Fluidtherapy;DME and/or AE instruction;Splinting;Patient/family education;Therapeutic exercises;Contrast Bath;Ultrasound;Therapeutic exercise;Scar mobilization;Therapeutic activities;Passive range of motion;Neuromuscular education;Cryotherapy;Electrical Stimulation;Parrafin;Manual Therapy   Plan check HEP - add light resistive exercise if appropriate   OT Home Exercise Plan issued:  05/19/15--HEP for RUE (wrist flex with elbow bent, heat, massage), LUE (tendon glides, scar massage). 05/21/15: review of all, splint wear and care; 05/26/15: AROM/PROM to L wrist in ext, lateral epicondylitis AROM ex #1-2. 05/28/15: Putty HEP for Lt hand (CTR protocol)   Consulted and Agree with Plan of Care Patient        Problem List Patient Active Problem List   Diagnosis Date Noted  . Acute gout 06/30/2015  . Preventative health care 06/18/2015  . Lateral epicondylitis of right elbow 04/17/2015  . Degenerative lumbar disc 12/30/2014  . Cough   . Degenerative joint disease involving multiple joints on both sides of body (right foot pain) 08/14/2014  . Chronic low back pain 04/15/2014  . Hot flashes 01/02/2014  . Diastolic CHF ([place]) 76/81/1572  . Fibromyalgia syndrome 06/18/2013  . Abdominal pain, epigastric 03/29/2013  . Myalgia 12/21/2012  . Chronic chest pain 11/21/2012  . Carpal tunnel syndrome, bilateral 07/25/2012  . Tension headache, chronic 05/24/2012  . Iron deficiency anemia 09/22/2011  . Depression 07/14/2011  . Anxiety 06/02/2011  . Lumbar spondylosis with myelopathy 11/20/2010  . Coronary atherosclerosis 03/10/2010  . Dyslipidemia 12/11/2008  . Vitamin D deficiency 11/26/2008  . Uncontrolled type 2 diabetes mellitus with insulin therapy ([place]) 05/25/2007  . PERIPHERAL NEUROPATHY 05/25/2007  . Essential hypertension 05/25/2007  . GERD 05/25/2007  . SYMPTOM, APNEA, SLEEP NOS 05/25/2007    Mariah Milling, OTR/L 07/29/2015, 4:22 PM  [facility]  [facility] [address] [place], [place], [address] Phone: [phone]   Fax:  [phone]

## 2015-08-04 ENCOUNTER — Encounter: Payer: Self-pay | Admitting: Occupational Therapy

## 2015-08-04 ENCOUNTER — Ambulatory Visit: Payer: Medicare Other | Admitting: Occupational Therapy

## 2015-08-04 DIAGNOSIS — M6281 Muscle weakness (generalized): Secondary | ICD-10-CM

## 2015-08-04 DIAGNOSIS — M25621 Stiffness of right elbow, not elsewhere classified: Secondary | ICD-10-CM | POA: Diagnosis not present

## 2015-08-04 DIAGNOSIS — M79642 Pain in left hand: Secondary | ICD-10-CM | POA: Diagnosis not present

## 2015-08-04 DIAGNOSIS — R29898 Other symptoms and signs involving the musculoskeletal system: Secondary | ICD-10-CM | POA: Diagnosis not present

## 2015-08-04 DIAGNOSIS — M25521 Pain in right elbow: Secondary | ICD-10-CM

## 2015-08-04 DIAGNOSIS — M25632 Stiffness of left wrist, not elsewhere classified: Secondary | ICD-10-CM

## 2015-08-04 NOTE — Patient Instructions (Signed)
Reviewed all stretching exercises through Phase III. Instructed on strengthening for ER, abduction (elbow bent, forearm neutral) as well as bicep curls 20 reps with 1 pound weight every other day (per protocol).

## 2015-08-04 NOTE — Therapy (Signed)
[facility] [address] [place], [place], [address] Phone: [phone]   Fax:  [phone]  Occupational Therapy Treatment  Patient Details  Name: [name] MRN: [id number] Date of Birth: 1951/03/17 Referring Provider:  Norman Herrlich, MD  Encounter Date: 08/04/2015      OT End of Session - 08/04/15 1308    Visit Number 12   Number of Visits 16   Date for OT Re-Evaluation 08/19/15   Authorization Type UHC Medicare/Medicaid, G-code needed, progress note needed every 1th visit   Authorization Time Period 07/22/2015 - 09/16/2015   Authorization - Visit Number 12   Authorization - Number of Visits 20   OT Start Time 1103   OT Stop Time 1145   OT Time Calculation (min) 42 min   Activity Tolerance Patient tolerated treatment well      Past Medical History  Diagnosis Date  . Hypertension   . Peripheral neuropathy ([place])   . Cervicalgia   . Low back pain syndrome   . Hyperlipidemia   . Vitamin D deficiency   . Vitamin B12 deficiency   . Iliotibial band syndrome   . Anemia   . Borderline personality disorder   . Nonorganic psychosis   . GERD (gastroesophageal reflux disease)   . Diabetes mellitus   . Depression   . Diabetic gastroparesis ([place])   . CAD (coronary artery disease)     Catherization 11/18/09>nonobstructive CAD , normal LV function, EF 65%  . Health maintenance examination     Colonoscopy 2009> normal; Diabetic eye exam 12/2008. No diabetic retinopathy; Mammogram 11/10> No evidence of malignancy, DXA 03/14/13 : normal  . Headache(784.0)   . Arthritis   . SBO (small bowel obstruction) ([place]) 01/09/2013  . Diastolic CHF ([place])     Grade I, on Echo 06/2013, EF 55-60%  . Sleep apnea     does not wear CPAP   . Diabetes mellitus, type II ([place])   . Wears glasses   . Wears dentures     upper  . Gout   . Right carpal tunnel syndrome 09/13/2014    Past Surgical History  Procedure Laterality Date  .  Abdominal hysterectomy    . Hand surgery  1992    rt  . Bowel resection N/A 01/10/2013    Procedure: SMALL BOWEL RESECTION;  Surgeon: Madilyn Hook, DO;  Location: [facility];  Service: General;  Laterality: N/A;  . Lysis of adhesion N/A 01/10/2013    Procedure: LYSIS OF ADHESION;  Surgeon: Madilyn Hook, DO;  Location: [facility];  Service: General;  Laterality: N/A;  . Laparotomy N/A 01/10/2013    Procedure: EXPLORATORY LAPAROTOMY;  Surgeon: Madilyn Hook, DO;  Location: [facility];  Service: General;  Laterality: N/A;  . Incisional hernia repair N/A 03/01/2014    Procedure: LAPAROSCOPIC INCISIONAL HERNIA;  Surgeon: Ralene Ok, MD;  Location[facility];  Service: General;  Laterality: N/A;  . Insertion of mesh N/A 03/01/2014    Procedure: INSERTION OF MESH;  Surgeon: Ralene Ok, MD;  Location[facility];  Service: General;  Laterality: N/A;  . Hernia repair  03/01/14    Lap incisional hernia repair w/mesh  . Appendectomy    . Carpal tunnel release Right 09/13/2014    Procedure: RIGHT WRIST CARPAL TUNNEL RELEASE;  Surgeon: Johnny Bridge, MD;  Location: [facility];  Service: Orthopedics;  Laterality: Right;  . Steriod injection Left 09/13/2014    Procedure: STEROID INJECTION LEFT HAND;  Surgeon: Vonna Kotyk  Llana Aliment, MD;  Location: [facility];  Service: Orthopedics;  Laterality: Left;  . Carpal tunnel release Left 02/28/2015    Procedure: LEFT CARPAL TUNNEL RELEASE;  Surgeon: Marchia Bond, MD;  Location: [facility];  Service: Orthopedics;  Laterality: Left;    There were no vitals filed for this visit.  Visit Diagnosis:  Elbow stiffness, right  Muscle weakness (generalized)  Right elbow pain  Stiffness of left wrist joint  Pain of left hand  Decreased grip strength      Subjective Assessment - 08/04/15 1112    Subjective  It seems like it is always something - maybe I over did a little bit at home.    Pertinent History R CTR 09/14/14, L CTR 02/28/15, R elbow  pain begain after L CTR    Repetition Increases Symptoms   Patient Stated Goals improve pain and hand use   Currently in Pain? No/denies  pt reports no true pain just a little discomfort                      OT Treatments/Exercises (OP) - 08/04/15 0001    ADLs   ADL Comments Provided education to patient regarding next additions per protocol of additional stretching and beginning strengthening exercises while pt using moist heat (moist heat per protocol prior to stretching and strenthening initiation). Pt able to verablize understanding of new exercises and rationale for addition of  activities.    Exercises   Exercises Elbow   Elbow Exercises   Other elbow exercises Reviewed entire HEP with pt (#4 from Phase II, 1-4 from Phase III) - pt needed min vc's again today to complete correctly. Intiated 1 pound resistance with ER, abduction (both with bent elbow and forearm in neutral) as well as bicep curls (supination) all per protocol . Pt able to complete without any pain. Pt asked to do 20 reps with 1 pound resistance every other day per protocol. Pt able to return demonstrate all exercises.                 OT Education - 08/04/15 1305    Education provided Yes   Education Details strengthening program intiated per [employer]) Educated Patient   Methods Explanation;Demonstration;Tactile cues;Verbal cues;Handout   Comprehension Verbalized understanding;Returned demonstration          OT Short Term Goals - 08/04/15 1306    OT SHORT TERM GOAL #1   Title Pt will be independent with initial HEP.--check STGs 06/12/15   Status Achieved   OT SHORT TERM GOAL #2   Title Pt will report pain less than or equal to 5/10 in R elbow and L wrist/hand.   Status Achieved  06/19/15:  improved but R elbow up to 8/10 at times with activity, up to 7/10 in L wrist with activity   OT SHORT TERM GOAL #3   Title Pt will be independent with splint wear/care prn.   Status  Achieved  06/19/15:  pt has not been wearing last few days due to misplacing, will look again before next visit   OT [place] #4   Title Pt will verbalize understanding of AE/adaptive strategies for ADLs to incr ease/reduce pain prn.   Status Achieved  06/19/15:  partially met, but needs review/further education   OT SHORT TERM GOAL #5   Title Pt will improve L wrist extension to at least 50 degrees for ADLs.  61* today   Status  Achieved  06/19/15:  40*           OT Long Term Goals - 08/04/15 1306    OT LONG TERM GOAL #1   Title Pt will be independent with updated HEP.--08/19/2015  pt has missed several appointemnts due personal issues therefore will renew with current unmet goals as ongoing.   Time 8   Period Weeks   Status On-going   OT LONG TERM GOAL #2   Title Pt will report pain less than or equal to 5/10 in R elbow and L wrist/hand.   Time 8   Period Weeks   Status Achieved   OT LONG TERM GOAL #3   Title Pt will improve UE functional use as shown by improving score on quick DASH to 35% or less.   Baseline 57.5%   Time 8   Period Weeks   Status Achieved  current score = 9% impaired   OT LONG TERM GOAL #4   Title Pt will improve bilateral grip strength in R dominant hand to at least 55 pounds to assist with functional tasks such as opening jars (new baseline = 50# on 06/03/2015)   Baseline R-32lbs, L-33lbs   Time 8   Period Weeks   Status On-going  R= 50 pounds, L= 60 pounds. Pt is  R handed               Plan - 08/04/15 1306    Clinical Impression Statement Pt tolerating HEP and able to add more resistive strengthening today to HEP. Pt needs to be cautioned not to "overdo" and strongly enforced wearing band during strengthening program as well as any resistive home activities.    Pt will benefit from skilled therapeutic intervention in order to improve on the following deficits (Retired) Decreased strength;Pain;Impaired UE functional use;Decreased scar  mobility;Decreased knowledge of use of DME;Increased edema;Decreased range of motion;Decreased coordination   Rehab Potential Good   OT Frequency 2x / week   OT Duration 8 weeks   OT Treatment/Interventions Self-care/ADL training;Moist Heat;Fluidtherapy;DME and/or AE instruction;Splinting;Patient/family education;Therapeutic exercises;Contrast Bath;Ultrasound;Therapeutic exercise;Scar mobilization;Therapeutic activities;Passive range of motion;Neuromuscular education;Cryotherapy;Electrical Stimulation;Parrafin;Manual Therapy   Plan check HEP, add last three strengthening exercises.Consider d/c in next 1-2 visits   OT Home Exercise Plan issued:  05/19/15--HEP for RUE (wrist flex with elbow bent, heat, massage), LUE (tendon glides, scar massage). 05/21/15: review of all, splint wear and care; 05/26/15: AROM/PROM to L wrist in ext, lateral epicondylitis AROM ex #1-2. 05/28/15: Putty HEP for Lt hand (CTR protocol)   Consulted and Agree with Plan of Care Patient        Problem List Patient Active Problem List   Diagnosis Date Noted  . Acute gout 06/30/2015  . Preventative health care 06/18/2015  . Lateral epicondylitis of right elbow 04/17/2015  . Degenerative lumbar disc 12/30/2014  . Cough   . Degenerative joint disease involving multiple joints on both sides of body (right foot pain) 08/14/2014  . Chronic low back pain 04/15/2014  . Hot flashes 01/02/2014  . Diastolic CHF ([place]) 35/59/7416  . Fibromyalgia syndrome 06/18/2013  . Abdominal pain, epigastric 03/29/2013  . Myalgia 12/21/2012  . Chronic chest pain 11/21/2012  . Carpal tunnel syndrome, bilateral 07/25/2012  . Tension headache, chronic 05/24/2012  . Iron deficiency anemia 09/22/2011  . Depression 07/14/2011  . Anxiety 06/02/2011  . Lumbar spondylosis with myelopathy 11/20/2010  . Coronary atherosclerosis 03/10/2010  . Dyslipidemia 12/11/2008  . Vitamin D deficiency 11/26/2008  . Uncontrolled type 2 diabetes mellitus with  insulin  therapy ([place]) 05/25/2007  . PERIPHERAL NEUROPATHY 05/25/2007  . Essential hypertension 05/25/2007  . GERD 05/25/2007  . SYMPTOM, APNEA, SLEEP NOS 05/25/2007    Quay Burow, OTR/L 08/04/2015, 1:09 PM  [facility] [address] [place], [place], [address] Phone: [phone]   Fax:  [phone]

## 2015-08-05 ENCOUNTER — Encounter: Payer: Self-pay | Admitting: Internal Medicine

## 2015-08-05 ENCOUNTER — Ambulatory Visit (INDEPENDENT_AMBULATORY_CARE_PROVIDER_SITE_OTHER): Payer: Medicare Other | Admitting: Internal Medicine

## 2015-08-05 VITALS — BP 124/60 | HR 72 | Temp 97.9°F | Ht 65.0 in | Wt 182.8 lb

## 2015-08-05 DIAGNOSIS — N183 Chronic kidney disease, stage 3 unspecified: Secondary | ICD-10-CM | POA: Insufficient documentation

## 2015-08-05 DIAGNOSIS — D509 Iron deficiency anemia, unspecified: Secondary | ICD-10-CM

## 2015-08-05 DIAGNOSIS — Z794 Long term (current) use of insulin: Secondary | ICD-10-CM | POA: Diagnosis not present

## 2015-08-05 DIAGNOSIS — F329 Major depressive disorder, single episode, unspecified: Secondary | ICD-10-CM

## 2015-08-05 DIAGNOSIS — E1122 Type 2 diabetes mellitus with diabetic chronic kidney disease: Secondary | ICD-10-CM

## 2015-08-05 DIAGNOSIS — E559 Vitamin D deficiency, unspecified: Secondary | ICD-10-CM

## 2015-08-05 DIAGNOSIS — E119 Type 2 diabetes mellitus without complications: Secondary | ICD-10-CM

## 2015-08-05 DIAGNOSIS — I10 Essential (primary) hypertension: Secondary | ICD-10-CM

## 2015-08-05 DIAGNOSIS — F32A Depression, unspecified: Secondary | ICD-10-CM

## 2015-08-05 DIAGNOSIS — R079 Chest pain, unspecified: Secondary | ICD-10-CM

## 2015-08-05 DIAGNOSIS — G8929 Other chronic pain: Secondary | ICD-10-CM

## 2015-08-05 LAB — POCT GLYCOSYLATED HEMOGLOBIN (HGB A1C): Hemoglobin A1C: 6.3

## 2015-08-05 LAB — GLUCOSE, CAPILLARY: GLUCOSE-CAPILLARY: 125 mg/dL — AB (ref 65–99)

## 2015-08-05 MED ORDER — CLONIDINE HCL 0.3 MG/24HR TD PTWK: 0.3000 mg | MEDICATED_PATCH | TRANSDERMAL | Status: DC

## 2015-08-05 MED ORDER — ISOSORBIDE MONONITRATE ER 60 MG PO TB24: 60.0000 mg | ORAL_TABLET | Freq: Every morning | ORAL | Status: DC

## 2015-08-05 NOTE — Assessment & Plan Note (Signed)
Refilled Imdur per her request.

## 2015-08-05 NOTE — Assessment & Plan Note (Addendum)
Recheck Vitamin D today as a part of CKD 3 management.  ADDENDUM 08/08/2015  10:11 AM:  Vitamin D mildly low but stable. Review with patient at PCP follow-up.

## 2015-08-05 NOTE — Assessment & Plan Note (Addendum)
Recheck iron panel and ferritin today as it has been at least a year and she is no longer on Fe therapy. Last CBC in August with Hb 11, MCV 78.   ADDENDUM 08/08/2015  10:10 AM:  Fe studies show improvement.   Iron/TIBC/Ferritin/ %Sat    Component Value Date/Time   IRON 72 08/05/2015 0931   IRON 42 05/09/2013 1644   TIBC 330 08/05/2015 0931   TIBC 424 05/09/2013 1644   FERRITIN 56 08/05/2015 0931   FERRITIN 15 01/31/2014 0950   IRONPCTSAT 10* 05/09/2013 1644   IRONPCTSAT 7* 07/29/2008 2032

## 2015-08-05 NOTE — Progress Notes (Signed)
   Subjective:    Patient ID: Shanicka Oldenkamp, female    DOB: 08/07/1951, 64 y.o.   MRN: [id number]  HPI Ms. Friedhoff is a 64 year old female with HTN, diastolic CHF, GERD, degenerative joint disease, fibromyalgia, depression who presents today for follow-up visit. Please see assessment & plan for documentation of chronic medical problems.    Review of Systems  Constitutional: Positive for activity change, appetite change and fatigue.  Cardiovascular: Negative for chest pain.  Gastrointestinal: Positive for diarrhea and constipation. Negative for nausea and vomiting.  Musculoskeletal: Positive for back pain.  Psychiatric/Behavioral: Positive for sleep disturbance and dysphoric mood. Negative for suicidal ideas and self-injury.       Objective:   Physical Exam Constitutional: Obese, elderly African American female. No distress.  Head: Normocephalic and atraumatic.  Eyes: Conjunctivae are normal. Pupils are 4 mm, direct, consensual, near.  Cardiovascular: Normal rate, regular rhythm and normal heart sounds.  No gallop, friction rub, murmur heard. Pulmonary/Chest: Effort normal. No respiratory distress. No wheezes, rales.  Abdominal: Soft. Bowel sounds are normal. No distension. No tenderness.  Neurological: Alert and oriented to person, place, and time.  Coordination normal.  Skin: Warm and dry. Not diaphoretic.  Psychiatric: Affect mobile and congruent with thought content. Intermittently tearful and sad. No SI/HI.         Assessment & Plan:

## 2015-08-05 NOTE — Patient Instructions (Addendum)
Please follow-up with Dr. Danella Penton, and she will review the results of your labwork with you.   It was nice meeting you, and I wish you the best.

## 2015-08-05 NOTE — Assessment & Plan Note (Signed)
Repeat A1c today.  ADDENDUM 08/05/2015  6:23 PM:  A1c 6.3, mostly stable from 6.1 back in June. Continue current regimen.

## 2015-08-05 NOTE — Assessment & Plan Note (Addendum)
Agreeable to having PTH, Vitamin D, renal US checked.  ADDENDUM 09/24/2015  1:14 PM:  Renal US suggestive of scarring bilaterally. Not sure if this is clinically significant.

## 2015-08-05 NOTE — Assessment & Plan Note (Addendum)
Still coping with her daughter's death from back in Jul 23, 2023 as she reports not feeling well with changes in appetite, mood, energy, sleep. Feels her husband nags her about getting better. Prefers social isolation over mingling with others. Followed by Dr. Donell Beers and has been taking her current medications. No SI/HI.   Explained to her that her symptoms are likely related to her grieving. Recheck TSH today as it has been a year along with iron panel to r/o any other contributing factors to report of symptoms. Plan to follow-up with PCP to review results.  ADDENDUM 08/08/2015  10:11 AM:  TSH stable. Suspect her symptoms are related more to her grief.

## 2015-08-06 LAB — PTH, INTACT AND CALCIUM
Calcium: 9.5 mg/dL (ref 8.7–10.3)
PTH: 127 pg/mL — AB (ref 15–65)

## 2015-08-06 LAB — IRON AND TIBC
IRON: 72 ug/dL (ref 27–139)
Iron Saturation: 22 % (ref 15–55)
Total Iron Binding Capacity: 330 ug/dL (ref 250–450)
UIBC: 258 ug/dL (ref 118–369)

## 2015-08-06 LAB — VITAMIN D 25 HYDROXY (VIT D DEFICIENCY, FRACTURES): VIT D 25 HYDROXY: 27.5 ng/mL — AB (ref 30.0–100.0)

## 2015-08-06 LAB — TSH: TSH: 1.59 u[IU]/mL (ref 0.450–4.500)

## 2015-08-06 LAB — FERRITIN: FERRITIN: 56 ng/mL (ref 15–150)

## 2015-08-06 NOTE — Progress Notes (Signed)
Internal Medicine Clinic Attending  Case discussed with Dr. Patel,Rushil at the time of the visit.  We reviewed the resident's history and exam and pertinent patient test results.  I agree with the assessment, diagnosis, and plan of care documented in the resident's note.  

## 2015-08-07 ENCOUNTER — Encounter (HOSPITAL_COMMUNITY): Payer: Self-pay | Admitting: Clinical

## 2015-08-07 ENCOUNTER — Ambulatory Visit (INDEPENDENT_AMBULATORY_CARE_PROVIDER_SITE_OTHER): Payer: Medicaid Other | Admitting: Clinical

## 2015-08-07 DIAGNOSIS — F331 Major depressive disorder, recurrent, moderate: Secondary | ICD-10-CM | POA: Diagnosis not present

## 2015-08-07 NOTE — Progress Notes (Signed)
   THERAPIST PROGRESS NOTE  Session Time: 8:00 - 8:55  Participation Level: Active  Behavioral Response: Casual and NeatAlertDepressed  Type of Therapy: Individual Therapy  Treatment Goals addressed: Improve Psychiatric Symptoms, Calming Skills, Emotional Regulation Skills, Interpersonal Relationship Skills  Interventions: Motivational Interviewing, CBT, Grounding & Mindfulness Techniques  Summary: [name] is a 63 y.o. female who presents with Major Depressive Disorder.   Suicidal/Homicidal: No -without intent/plan  Therapist Response:  Dorenda met with clinician for an individual session. Rosibel shared about her psychiatric symptoms, her current life events and her homework. Zykeriah shared that she did not bring her homework with her today but that she is continuing to do her gratitude journal and her grounding techniques. She shared that she went to hospice for a group on grief. She shared that she enjoyed the group and that she was missing her daughter a lot. Mattilynn also shared that she has been having some conflict with her husband. She shared  some of her issues  and concerns about his drinking. Takeria shared that she continues to isolate though she desires to be more social. Client and clinician discussed some outlets for her. She shared that one of the challenges is that most of her friends are church friends. 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 Ymani and clinician discussed the different kinds of friends she might have.  Zen and clinician agreed that having some outside interaction would be helpful. Client and clinician discussed the possibility of her returning to the [employer] or attending the senior center that has other activities. Masae said that she would like to check out the senior center. Client and clinician discussed the fact that when you're making new friends it can be  uncomfortable. Rosabella and clinician discussed some of the challenges of being the new person in a class or situation. Client and clinician discussed the skills that she has been using to regulate her emotions. Nikoleta and clinician discussed what she could do to continue to grow her skills. Client and clinician practice a new mindfulness technique together. Odester agreed to continue her homework until next session.    Plan: Return again in 1-2 weeks.  Diagnosis:     Axis I: Major Depressive Disorder   Ellanie Oppedisano A, LCSW 08/07/2015

## 2015-08-08 DIAGNOSIS — N2581 Secondary hyperparathyroidism of renal origin: Secondary | ICD-10-CM | POA: Insufficient documentation

## 2015-08-11 ENCOUNTER — Telehealth: Payer: Self-pay | Admitting: Pharmacist

## 2015-08-11 ENCOUNTER — Encounter: Payer: Self-pay | Admitting: Occupational Therapy

## 2015-08-11 NOTE — Telephone Encounter (Signed)
Received message from Heywood Ilesushil Patel, MD about patients clonidine patches not sticking to skin.  Called pt to discuss administration of patches which have been falling off even with use of the adhesive cover. Pt was applying to dry clean area of upper breast before but there was some skin irritation.  Pt then switched to arms for patch application but could not put patches on side of arm due to being unable to see how she was applying them.  Pt has been applying patches at upper area of arm towards her bicep which may have too much movement leading to patches falling off.  Instructed pt to try application on upper chest above her breast or upper arm near the deltoid. Pt will call clinic if continues to have problems.

## 2015-08-12 ENCOUNTER — Ambulatory Visit: Payer: Medicare Other | Admitting: Occupational Therapy

## 2015-08-13 ENCOUNTER — Encounter: Payer: Self-pay | Admitting: Internal Medicine

## 2015-08-13 ENCOUNTER — Ambulatory Visit (INDEPENDENT_AMBULATORY_CARE_PROVIDER_SITE_OTHER): Payer: Medicare Other | Admitting: Internal Medicine

## 2015-08-13 VITALS — BP 120/54 | HR 69 | Temp 98.3°F | Wt 182.4 lb

## 2015-08-13 DIAGNOSIS — E559 Vitamin D deficiency, unspecified: Secondary | ICD-10-CM | POA: Diagnosis not present

## 2015-08-13 DIAGNOSIS — F329 Major depressive disorder, single episode, unspecified: Secondary | ICD-10-CM

## 2015-08-13 DIAGNOSIS — I10 Essential (primary) hypertension: Secondary | ICD-10-CM

## 2015-08-13 DIAGNOSIS — F32A Depression, unspecified: Secondary | ICD-10-CM

## 2015-08-13 MED ORDER — METOPROLOL SUCCINATE ER 25 MG PO TB24: 12.5000 mg | ORAL_TABLET | Freq: Every morning | ORAL | Status: DC

## 2015-08-13 NOTE — Progress Notes (Signed)
Subjective:   Patient ID: [name] female   DOB: 19-Feb-1951 64 y.o.   MRN: [id number]  HPI: [name] is a 64 y.o. w/ PMHx as outlined below who presents to clinic for lab work f/u. She has no new complaints today, she does have fatigue and feeling drained which is unchanged from last visit during which lab work up was WNL other than mildly low Vit D level.   Please see problem list for status of the pt's chronic medical problems.  Past Medical History  Diagnosis Date  . Hypertension   . Peripheral neuropathy (HCC)   . Cervicalgia   . Low back pain syndrome   . Hyperlipidemia   . Vitamin D deficiency   . Vitamin B12 deficiency   . Iliotibial band syndrome   . Anemia   . Borderline personality disorder   . Nonorganic psychosis   . GERD (gastroesophageal reflux disease)   . Diabetes mellitus   . Depression   . Diabetic gastroparesis (HCC)   . CAD (coronary artery disease)     Catherization 11/18/09>nonobstructive CAD , normal LV function, EF 65%  . Health maintenance examination     Colonoscopy 2009> normal; Diabetic eye exam 12/2008. No diabetic retinopathy; Mammogram 11/10> No evidence of malignancy, DXA 03/14/13 : normal  . Headache(784.0)   . Arthritis   . SBO (small bowel obstruction) (HCC) 01/09/2013  . Diastolic CHF (HCC)     Grade I, on Echo 06/2013, EF 55-60%  . Sleep apnea     does not wear CPAP   . Diabetes mellitus, type II (HCC)   . Wears glasses   . Wears dentures     upper  . Gout   . Right carpal tunnel syndrome 09/13/2014   Current Outpatient Prescriptions  Medication Sig Dispense Refill  . ACCU-CHEK AVIVA PLUS test strip TEST UPTO 4 TIMES DAILY 125 each 5  . ACCU-CHEK FASTCLIX LANCETS MISC TEST five times a day 102 each 11  . acetaminophen-codeine (TYLENOL #4) 300-60 MG per tablet Take 2 tablets by mouth every 8 (eight) hours as needed for moderate pain. 180 tablet 2  . albuterol (PROVENTIL HFA;VENTOLIN HFA) 108 (90 BASE) MCG/ACT inhaler Inhale  2 puffs into the lungs every 6 (six) hours as needed for wheezing or shortness of breath. 1 Inhaler 2  . aspirin 81 MG chewable tablet Chew 81 mg by mouth daily.    . BD PEN NEEDLE NANO U/F 32G X 4 MM MISC USE UPTO 5 TIMES DAILY 100 each 4  . BISACODYL PO Take 100 mg by mouth daily as needed.    Marland Kitchen buPROPion (WELLBUTRIN XL) 150 MG 24 hr tablet Take 3 tablets (450 mg total) by mouth daily. 270 tablet 1  . clonazePAM (KLONOPIN) 1 MG tablet 1 tid (Patient taking differently: 1 mg 3 (three) times daily as needed (morning, evening, bedtime). 1 tid) 90 tablet 3  . cloNIDine (CATAPRES - DOSED IN MG/24 HR) 0.3 mg/24hr patch Place 1 patch (0.3 mg total) onto the skin every 7 (seven) days. 4 patch 11  . dexlansoprazole (DEXILANT) 60 MG capsule Take 1 capsule (60 mg total) by mouth daily. For reflux 30 capsule   . diclofenac sodium (VOLTAREN) 1 % GEL Apply 2 g topically 4 (four) times daily. 3 Tube 1  . Insulin Glargine (LANTUS SOLOSTAR) 100 UNIT/ML Solostar Pen Inject 51 Units into the skin at bedtime. 45 mL 4  . isosorbide mononitrate (IMDUR) 60 MG 24 hr tablet Take 1 tablet (60  mg total) by mouth every morning. 90 tablet 4  . losartan (COZAAR) 50 MG tablet Take 1 tablet (50 mg total) by mouth daily. 90 tablet 4  . metoprolol succinate (TOPROL-XL) 25 MG 24 hr tablet Take 0.5 tablets (12.5 mg total) by mouth every morning. 90 tablet 4  . ondansetron (ZOFRAN) 4 MG tablet Take 4 mg by mouth every 8 (eight) hours as needed for nausea.     . rosuvastatin (CRESTOR) 10 MG tablet Take 1 tablet (10 mg total) by mouth at bedtime. 30 tablet 11  . sertraline (ZOLOFT) 100 MG tablet Take 1 tablet (100 mg total) by mouth daily. 90 tablet 1  . simethicone (GAS-X) 80 MG chewable tablet Chew 1 tablet (80 mg total) by mouth 4 (four) times daily as needed for flatulence. 100 tablet 2  . sitaGLIPtin (JANUVIA) 50 MG tablet Take 1 tablet (50 mg total) by mouth daily. 90 tablet 4  . traZODone (DESYREL) 50 MG tablet Take 1 tablet  (50 mg total) by mouth at bedtime. 90 tablet 1   No current facility-administered medications for this visit.   Family History  Problem Relation Age of Onset  . Diabetes Mother   . Hypertension Mother   . Hyperlipidemia Mother   . Arthritis Mother   . Depression Mother   . Heart disease Father   . Diabetes Brother   . Heart disease Brother   . Heart disease Paternal Grandmother   . Diabetes Brother    Social History   Social History  . Marital Status: Married    Spouse Name: N/A  . Number of Children: N/A  . Years of Education: N/A   Social History Main Topics  . Smoking status: Former Smoker    Types: Cigarettes    Quit date: 10/25/2002  . Smokeless tobacco: Never Used  . Alcohol Use: No  . Drug Use: No  . Sexual Activity: Not Currently   Other Topics Concern  . None   Social History Narrative   Review of Systems: Review of Systems  Constitutional: Positive for malaise/fatigue (unchanged from last visit).  Respiratory: Negative for shortness of breath.   Cardiovascular: Negative for chest pain.  Gastrointestinal: Positive for heartburn and nausea (since 02/2015). Negative for vomiting.  Musculoskeletal: Positive for back pain (chronic).  Psychiatric/Behavioral: Positive for depression. Negative for suicidal ideas.    Objective:  Physical Exam: Filed Vitals:   08/13/15 1434  BP: 120/54  Pulse: 69  Temp: 98.3 F (36.8 C)  TempSrc: Oral  Weight: 182 lb 6.4 oz (82.736 kg)  SpO2: 100%   Physical Exam  Constitutional: She appears well-developed and well-nourished. No distress.  Cardiovascular: Normal rate, regular rhythm and normal heart sounds.   No murmur heard. Pulmonary/Chest: Effort normal and breath sounds normal. No respiratory distress. She has no wheezes. She has no rales.  Abdominal: Soft. Bowel sounds are normal.  Neurological: She is alert.  Skin: Skin is warm and dry.   Assessment & Plan:   Please see problem based assessment and plan.

## 2015-08-13 NOTE — Assessment & Plan Note (Signed)
Vit D 27.5 checked 1 week ago. Advised to take OTC multivitamin with [phone] units of Vit D. Denies need for Vit D prescription.

## 2015-08-13 NOTE — Patient Instructions (Addendum)
Start taking metoprolol 12.5 mg once a day. We will see you in 1 month for a BP check.   GREAT work on your diabetes control.   Start taking an over the counter multivitamin with [phone] units of vitamin D.

## 2015-08-13 NOTE — Assessment & Plan Note (Signed)
Pt has depression on zoloft and wellbutrin. She is being followed by Dr. Donell BeersPlovsky whom she will see next month. She is also going to therapy twice a week. Denies SI or HI. Likely chronic abd pain and fatigue from her depression as work up to date has been negative. Will cont to monitor.

## 2015-08-13 NOTE — Assessment & Plan Note (Addendum)
BP Readings from Last 3 Encounters:  08/13/15 120/54  08/05/15 124/60  07/03/15 144/67    Lab Results  Component Value Date   NA 137 06/02/2015   K 4.5 06/02/2015   CREATININE 1.69* 06/02/2015    Assessment: Blood pressure control:  controlled Progress toward BP goal:   at goal Comments: pt complaining of fatigue most likely from her depression. However pt informed of side effect of BB causing fatigue and is willing to trial a decreased dose of her metoprolol 25mg .   Plan: Medications:  Decreased metoprolol from 25mg  to 12.5mg  daily.  Educational resources provided:   Self management tools provided:   Other plans: f/u in one month. If pt is still having fatigue with decreased dose will go back to metoprolol 25mg  as likely fatigue is from depression. Also pt has hx of CAD and is on imdur as well as grade 1 diastolic dysfunction w/ nl EF, thus would benefit from staying on a beta blocker.

## 2015-08-15 NOTE — Progress Notes (Signed)
Internal Medicine Clinic Attending  Case discussed with Dr. Truong soon after the resident saw the patient.  We reviewed the resident's history and exam and pertinent patient test results.  I agree with the assessment, diagnosis, and plan of care documented in the resident's note. 

## 2015-08-18 ENCOUNTER — Ambulatory Visit: Payer: Medicare Other | Admitting: Occupational Therapy

## 2015-08-18 DIAGNOSIS — M25632 Stiffness of left wrist, not elsewhere classified: Secondary | ICD-10-CM | POA: Diagnosis not present

## 2015-08-18 DIAGNOSIS — M25521 Pain in right elbow: Secondary | ICD-10-CM

## 2015-08-18 DIAGNOSIS — M6281 Muscle weakness (generalized): Secondary | ICD-10-CM | POA: Diagnosis not present

## 2015-08-18 DIAGNOSIS — M79642 Pain in left hand: Secondary | ICD-10-CM | POA: Diagnosis not present

## 2015-08-18 DIAGNOSIS — R29898 Other symptoms and signs involving the musculoskeletal system: Secondary | ICD-10-CM | POA: Diagnosis not present

## 2015-08-18 DIAGNOSIS — M25621 Stiffness of right elbow, not elsewhere classified: Secondary | ICD-10-CM | POA: Diagnosis not present

## 2015-08-18 NOTE — Therapy (Signed)
[facility] [address] [place], [place], [address] Phone: [phone]   Fax:  [phone]  Occupational Therapy Treatment  Patient Details  Name: Cordie Beazley MRN: [id number] Date of Birth: August 10, 1951 No Data Recorded  Encounter Date: 08/18/2015      OT End of Session - 08/18/15 1042    Visit Number 13   Number of Visits 16   Date for OT Re-Evaluation 08/19/15   Authorization Type UHC Medicare/Medicaid, G-code needed, progress note needed every 1th visit   Authorization Time Period 07/22/2015 - 09/16/2015   Authorization - Visit Number 13   Authorization - Number of Visits 20   OT Start Time 1021   OT Stop Time 1100   OT Time Calculation (min) 39 min   Activity Tolerance Patient tolerated treatment well      Past Medical History  Diagnosis Date  . Hypertension   . Peripheral neuropathy (HCC)   . Cervicalgia   . Low back pain syndrome   . Hyperlipidemia   . Vitamin D deficiency   . Vitamin B12 deficiency   . Iliotibial band syndrome   . Anemia   . Borderline personality disorder   . Nonorganic psychosis   . GERD (gastroesophageal reflux disease)   . Diabetes mellitus   . Depression   . Diabetic gastroparesis (HCC)   . CAD (coronary artery disease)     Catherization 11/18/09>nonobstructive CAD , normal LV function, EF 65%  . Health maintenance examination     Colonoscopy 2009> normal; Diabetic eye exam 12/2008. No diabetic retinopathy; Mammogram 11/10> No evidence of malignancy, DXA 03/14/13 : normal  . Headache(784.0)   . Arthritis   . SBO (small bowel obstruction) (HCC) 01/09/2013  . Diastolic CHF (HCC)     Grade I, on Echo 06/2013, EF 55-60%  . Sleep apnea     does not wear CPAP   . Diabetes mellitus, type II (HCC)   . Wears glasses   . Wears dentures     upper  . Gout   . Right carpal tunnel syndrome 09/13/2014    Past Surgical History  Procedure Laterality Date  . Abdominal hysterectomy     . Hand surgery  1992    rt  . Bowel resection N/A 01/10/2013    Procedure: SMALL BOWEL RESECTION;  Surgeon: Lodema Pilot, DO;  Location: [facility] OR;  Service: General;  Laterality: N/A;  . Lysis of adhesion N/A 01/10/2013    Procedure: LYSIS OF ADHESION;  Surgeon: Lodema Pilot, DO;  Location: [facility] OR;  Service: General;  Laterality: N/A;  . Laparotomy N/A 01/10/2013    Procedure: EXPLORATORY LAPAROTOMY;  Surgeon: Lodema Pilot, DO;  Location: [facility] OR;  Service: General;  Laterality: N/A;  . Incisional hernia repair N/A 03/01/2014    Procedure: LAPAROSCOPIC INCISIONAL HERNIA;  Surgeon: Axel Filler, MD;  Location: [facility];  Service: General;  Laterality: N/A;  . Insertion of mesh N/A 03/01/2014    Procedure: INSERTION OF MESH;  Surgeon: Axel Filler, MD;  Location: [facility];  Service: General;  Laterality: N/A;  . Hernia repair  03/01/14    Lap incisional hernia repair w/mesh  . Appendectomy    . Carpal tunnel release Right 09/13/2014    Procedure: RIGHT WRIST CARPAL TUNNEL RELEASE;  Surgeon: Eulas Post, MD;  Location: [facility];  Service: Orthopedics;  Laterality: Right;  . Steriod injection Left 09/13/2014    Procedure: STEROID INJECTION LEFT HAND;  Surgeon: Eulas Post, MD;  Location: [place];  Service: Orthopedics;  Laterality: Left;  . Carpal tunnel release Left 02/28/2015    Procedure: LEFT CARPAL TUNNEL RELEASE;  Surgeon: Marchia Bond, MD;  Location: [place];  Service: Orthopedics;  Laterality: Left;    There were no vitals filed for this visit.  Visit Diagnosis:  Right elbow pain  Muscle weakness (generalized)      Subjective Assessment - 08/18/15 1040    Subjective  "It's a lot better.  I probably should wear my bands more at home"   Pertinent History R CTR 09/14/14, L CTR 02/28/15, R elbow pain begain after L CTR    Repetition Increases Symptoms   Patient Stated Goals improve pain and hand use   Currently in Pain? Yes   Pain  Score 4   but more stiffness than pain   Pain Location Elbow   Pain Orientation Right   Pain Descriptors / Indicators Aching;Tightness   Aggravating Factors  with stretching   Pain Relieving Factors heat                      OT Treatments/Exercises (OP) - 08/18/15 0001    Elbow Exercises   Other elbow exercises Reviewed entire HEP with pt.  Reviewed #4 from Phase II, 1-4 from Phase III x5 each.  Pt needed occasional min v.c. today to complete correctly. Pt returned demo yellow putty HEP.  Reviewed 1 pound resistance with ER, abduction (both with bent elbow and forearm in neutral) as well as bicep curls (supination).  Then added shoulder flex (with elbow slightly bent), IR, supination/pronation, wrist flex/ext with 1lb wt. (per protocol).  Pt able to complete without any pain wearing counterforce band.  Pt performed 15-20 reps with 1 pound resistance today and instructed to perform strengthening exercises every other day at home.  Pt able to return demonstrate all exercises with min v.c.    Ultrasound   Ultrasound Location R elbow/proximal dorsal forearm   Ultrasound Parameters 75mhz, continuous, 1.0wts/cm2 x37min with no adverse reactions.  Followed by exercises.   Ultrasound Goals Pain  Stiffness reported today/mild soreness initially                OT Education - 08/18/15 1041    Education Details emphasized importance of continued splint/counterforce band wear; Updated strengthening HEP (per protocol--added shoulder flex with elbow slightly bent, supination/pronation, IR, wrist flex/ext)   Person(s) Educated Patient   Methods Explanation   Comprehension Verbalized understanding          OT Short Term Goals - 08/04/15 1306    OT SHORT TERM GOAL #1   Title Pt will be independent with initial HEP.--check STGs 06/12/15   Status Achieved   OT SHORT TERM GOAL #2   Title Pt will report pain less than or equal to 5/10 in R elbow and L wrist/hand.   Status Achieved   06/19/15:  improved but R elbow up to 8/10 at times with activity, up to 7/10 in L wrist with activity   OT SHORT TERM GOAL #3   Title Pt will be independent with splint wear/care prn.   Status Achieved  06/19/15:  pt has not been wearing last few days due to misplacing, will look again before next visit   OT [place] #4   Title Pt will verbalize understanding of AE/adaptive strategies for ADLs to incr ease/reduce pain prn.   Status Achieved  06/19/15:  partially met, but needs review/further  education   OT SHORT TERM GOAL #5   Title Pt will improve L wrist extension to at least 50 degrees for ADLs.  65* today   Status Achieved  06/19/15:  40*           OT Long Term Goals - 08/04/15 1306    OT LONG TERM GOAL #1   Title Pt will be independent with updated HEP.--08/19/2015  pt has missed several appointemnts due personal issues therefore will renew with current unmet goals as ongoing.   Time 8   Period Weeks   Status On-going   OT LONG TERM GOAL #2   Title Pt will report pain less than or equal to 5/10 in R elbow and L wrist/hand.   Time 8   Period Weeks   Status Achieved   OT LONG TERM GOAL #3   Title Pt will improve UE functional use as shown by improving score on quick DASH to 35% or less.   Baseline 57.5%   Time 8   Period Weeks   Status Achieved  current score = 9% impaired   OT LONG TERM GOAL #4   Title Pt will improve bilateral grip strength in R dominant hand to at least 55 pounds to assist with functional tasks such as opening jars (new baseline = 50# on 06/03/2015)   Baseline R-32lbs, L-33lbs   Time 8   Period Weeks   Status On-going  R= 50 pounds, L= 60 pounds. Pt is  R handed               Plan - 08/18/15 1043    Clinical Impression Statement Pt continues to progress with tolerance of strengthening without pain.  Pt demo difficulty returning demo strengthening exercises and would benefit from review.  Also reviewed importance of counterforce band  wear/splint wear.   Plan review strengthening, ? d/c next session   OT Home Exercise Plan issued:  05/19/15--HEP for RUE (wrist flex with elbow bent, heat, massage), LUE (tendon glides, scar massage). 05/21/15: review of all, splint wear and care; 05/26/15: AROM/PROM to L wrist in ext, lateral epicondylitis AROM ex #1-2. 05/28/15: Putty HEP for Lt hand (CTR protocol)   Consulted and Agree with Plan of Care Patient        Problem List Patient Active Problem List   Diagnosis Date Noted  . Secondary hyperparathyroidism of renal origin ([place]) 08/08/2015  . CKD (chronic kidney disease) stage 3, GFR 30-59 ml/min 08/05/2015  . Acute gout 06/30/2015  . Preventative health care 06/18/2015  . Lateral epicondylitis of right elbow 04/17/2015  . Degenerative lumbar disc 12/30/2014  . Cough   . Degenerative joint disease involving multiple joints on both sides of body (right foot pain) 08/14/2014  . Chronic low back pain 04/15/2014  . Hot flashes 01/02/2014  . Diastolic CHF ([place]) 03/00/9233  . Fibromyalgia syndrome 06/18/2013  . Abdominal pain, epigastric 03/29/2013  . Myalgia 12/21/2012  . Chronic chest pain 11/21/2012  . Carpal tunnel syndrome, bilateral 07/25/2012  . Tension headache, chronic 05/24/2012  . Iron deficiency anemia 09/22/2011  . Depression 07/14/2011  . Anxiety 06/02/2011  . Lumbar spondylosis with myelopathy 11/20/2010  . Coronary atherosclerosis 03/10/2010  . Dyslipidemia 12/11/2008  . Vitamin D deficiency 11/26/2008  . Controlled type 2 diabetes mellitus with insulin therapy ([place]) 05/25/2007  . PERIPHERAL NEUROPATHY 05/25/2007  . Essential hypertension 05/25/2007  . GERD 05/25/2007  . SYMPTOM, APNEA, SLEEP NOS 05/25/2007    [facility] 08/18/2015, 1:02 PM  [facility] [address] [place], [place], [address] Phone: [phone]   Fax:  [phone]  Name: Jackye Dever MRN: [id number] Date of Birth:  September 15, 1951  Vianne Bulls, OTR/L 08/18/2015 1:02 PM

## 2015-08-20 ENCOUNTER — Encounter: Payer: Self-pay | Admitting: Occupational Therapy

## 2015-08-20 ENCOUNTER — Ambulatory Visit: Payer: Medicare Other | Admitting: Occupational Therapy

## 2015-08-20 DIAGNOSIS — M6281 Muscle weakness (generalized): Secondary | ICD-10-CM

## 2015-08-20 DIAGNOSIS — R29898 Other symptoms and signs involving the musculoskeletal system: Secondary | ICD-10-CM

## 2015-08-20 DIAGNOSIS — M25632 Stiffness of left wrist, not elsewhere classified: Secondary | ICD-10-CM | POA: Diagnosis not present

## 2015-08-20 DIAGNOSIS — M25521 Pain in right elbow: Secondary | ICD-10-CM

## 2015-08-20 DIAGNOSIS — M79642 Pain in left hand: Secondary | ICD-10-CM

## 2015-08-20 DIAGNOSIS — M25621 Stiffness of right elbow, not elsewhere classified: Secondary | ICD-10-CM

## 2015-08-20 NOTE — Therapy (Signed)
[facility] [address] [place], [place], [address] Phone: [phone]   Fax:  [phone]  Occupational Therapy Treatment  Patient Details  Name: [name] MRN: [id number] Date of Birth: 04/12/1951 No Data Recorded  Encounter Date: 08/20/2015      OT End of Session - 08/20/15 1007    Visit Number 14   Number of Visits 16   Date for OT Re-Evaluation 09/16/15   Authorization Type [employer] Medicare/Medicaid, G-code needed, progress note needed every 1th visit   Authorization Time Period 07/22/2015 - 09/16/2015   Authorization - Visit Number 14   Authorization - Number of Visits 20   OT Start Time 0930   OT Stop Time 1000   OT Time Calculation (min) 30 min   Activity Tolerance Patient tolerated treatment well      Past Medical History  Diagnosis Date  . Hypertension   . Peripheral neuropathy (HCC)   . Cervicalgia   . Low back pain syndrome   . Hyperlipidemia   . Vitamin D deficiency   . Vitamin B12 deficiency   . Iliotibial band syndrome   . Anemia   . Borderline personality disorder   . Nonorganic psychosis   . GERD (gastroesophageal reflux disease)   . Diabetes mellitus   . Depression   . Diabetic gastroparesis (HCC)   . CAD (coronary artery disease)     Catherization 11/18/09>nonobstructive CAD , normal LV function, EF 65%  . Health maintenance examination     Colonoscopy 2009> normal; Diabetic eye exam 12/2008. No diabetic retinopathy; Mammogram 11/10> No evidence of malignancy, DXA 03/14/13 : normal  . Headache(784.0)   . Arthritis   . SBO (small bowel obstruction) (HCC) 01/09/2013  . Diastolic CHF (HCC)     Grade I, on Echo 06/2013, EF 55-60%  . Sleep apnea     does not wear CPAP   . Diabetes mellitus, type II (HCC)   . Wears glasses   . Wears dentures     upper  . Gout   . Right carpal tunnel syndrome 09/13/2014    Past Surgical History  Procedure Laterality Date  . Abdominal hysterectomy     . Hand surgery  1992    rt  . Bowel resection N/A 01/10/2013    Procedure: SMALL BOWEL RESECTION;  Surgeon: Brian Layton, DO;  Location: [facility] OR;  Service: General;  Laterality: N/A;  . Lysis of adhesion N/A 01/10/2013    Procedure: LYSIS OF ADHESION;  Surgeon: Brian Layton, DO;  Location: [facility] OR;  Service: General;  Laterality: N/A;  . Laparotomy N/A 01/10/2013    Procedure: EXPLORATORY LAPAROTOMY;  Surgeon: Brian Layton, DO;  Location: [facility];  Service: General;  Laterality: N/A;  . Incisional hernia repair N/A 03/01/2014    Procedure: LAPAROSCOPIC INCISIONAL HERNIA;  Surgeon: Armando Ramirez, MD;  Location: [facility];  Service: General;  Laterality: N/A;  . Insertion of mesh N/A 03/01/2014    Procedure: INSERTION OF MESH;  Surgeon: Armando Ramirez, MD;  Location: [facility];  Service: General;  Laterality: N/A;  . Hernia repair  03/01/14    Lap incisional hernia repair w/mesh  . Appendectomy    . Carpal tunnel release Right 09/13/2014    Procedure: RIGHT WRIST CARPAL TUNNEL RELEASE;  Surgeon: Joshua P Landau, MD;  Location: [facility];  Service: Orthopedics;  Laterality: Right;  . Steriod injection Left 09/13/2014    Procedure: STEROID INJECTION LEFT HAND;  Surgeon: Joshua P Landau, MD;    Location: [place];  Service: Orthopedics;  Laterality: Left;  . Carpal tunnel release Left 02/28/2015    Procedure: LEFT CARPAL TUNNEL RELEASE;  Surgeon: Marchia Bond, MD;  Location: [place];  Service: Orthopedics;  Laterality: Left;    There were no vitals filed for this visit.  Visit Diagnosis:  Right elbow pain  Muscle weakness (generalized)  Elbow stiffness, right  Stiffness of left wrist joint  Pain of left hand  Decreased grip strength      Subjective Assessment - 08/20/15 0934    Subjective  I think I have either a cold or the flu today   Pertinent History R CTR 09/14/14, L CTR 02/28/15, R elbow pain begain after L CTR    Repetition Increases Symptoms    Patient Stated Goals improve pain and hand use   Currently in Pain? No/denies                      OT Treatments/Exercises (OP) - 08/20/15 0001    Exercises   Exercises Elbow   Elbow Exercises   Other elbow exercises Reviewed all exercises from Phase III (stretching and strengthening) - pt able to return demostrate with min vc's. Strongly emphasized need for pt to be compliant with HEP to maintain gains. Pt verablized understanding. See protocol for specifics. Pt using 1 pound weight for all strengthening exercises and wearing band. Also reviewed stretching exercises. Checked remaining LTG's.                   OT Short Term Goals - 08/20/15 1005    OT SHORT TERM GOAL #1   Title Pt will be independent with initial HEP.--check STGs 06/12/15   Status Achieved   OT SHORT TERM GOAL #2   Title Pt will report pain less than or equal to 5/10 in R elbow and L wrist/hand.   Status Achieved  06/19/15:  improved but R elbow up to 8/10 at times with activity, up to 7/10 in L wrist with activity   OT SHORT TERM GOAL #3   Title Pt will be independent with splint wear/care prn.   Status Achieved  06/19/15:  pt has not been wearing last few days due to misplacing, will look again before next visit   OT [place] #4   Title Pt will verbalize understanding of AE/adaptive strategies for ADLs to incr ease/reduce pain prn.   Status Achieved  06/19/15:  partially met, but needs review/further education   OT SHORT TERM GOAL #5   Title Pt will improve L wrist extension to at least 50 degrees for ADLs.  62* today   Status Achieved  06/19/15:  40*           OT Long Term Goals - 08/20/15 1005    OT LONG TERM GOAL #1   Title Pt will be independent with updated HEP.--08/19/2015  pt has missed several appointemnts due personal issues therefore will renew with current unmet goals as ongoing.   Time 8   Period Weeks   Status Achieved   OT LONG TERM GOAL #2   Title Pt will  report pain less than or equal to 5/10 in R elbow and L wrist/hand.   Time 8   Period Weeks   Status Achieved   OT LONG TERM GOAL #3   Title Pt will improve UE functional use as shown by improving score on quick DASH to 35% or less.   Baseline  57.5%   Time 8   Period Weeks   Status Achieved  current score = 9% impaired   OT LONG TERM GOAL #4   Title Pt will improve bilateral grip strength in R dominant hand to at least 55 pounds to assist with functional tasks such as opening jars (new baseline = 50# on 06/03/2015)   Baseline R-32lbs, L-33lbs   Time 8   Period Weeks   Status Partially Met  R = 53 pounds. L = 60 pounds               Plan - 08/20/15 1006    Clinical Impression Statement Pt has met or partially met all goals and is ready for discharge. Pt in agreement. Pt is now pain free and able to work on strengthening wihout pain.    Pt will benefit from skilled therapeutic intervention in order to improve on the following deficits (Retired) Decreased strength;Pain;Impaired UE functional use;Decreased scar mobility;Decreased knowledge of use of DME;Increased edema;Decreased range of motion;Decreased coordination   Rehab Potential Good   OT Frequency 2x / week   OT Duration 8 weeks   OT Treatment/Interventions Self-care/ADL training;Moist Heat;Fluidtherapy;DME and/or AE instruction;Splinting;Patient/family education;Therapeutic exercises;Contrast Bath;Ultrasound;Therapeutic exercise;Scar mobilization;Therapeutic activities;Passive range of motion;Neuromuscular education;Cryotherapy;Electrical Stimulation;Parrafin;Manual Therapy   Plan d/c from OT   OT Home Exercise Plan issued:  05/19/15--HEP for RUE (wrist flex with elbow bent, heat, massage), LUE (tendon glides, scar massage). 05/21/15: review of all, splint wear and care; 05/26/15: AROM/PROM to L wrist in ext, lateral epicondylitis AROM ex #1-2. 05/28/15: Putty HEP for Lt hand (CTR protocol)   Consulted and Agree with Plan of Care  Patient          G-Codes - 08/20/15 1009    Functional Assessment Tool Used Quick Dash, Pain scale, dynamometer   Functional Limitation Carrying, moving and handling objects   Carrying, Moving and Handling Objects Current Status (G8984) At least 1 percent but less than 20 percent impaired, limited or restricted   Carrying, Moving and Handling Objects Goal Status (G8985) At least 20 percent but less than 40 percent impaired, limited or restricted   Carrying, Moving and Handling Objects Discharge Status (G8986) At least 1 percent but less than 20 percent impaired, limited or restricted      Problem List Patient Active Problem List   Diagnosis Date Noted  . Secondary hyperparathyroidism of renal origin (HCC) 08/08/2015  . CKD (chronic kidney disease) stage 3, GFR 30-59 ml/min 08/05/2015  . Acute gout 06/30/2015  . Preventative health care 06/18/2015  . Lateral epicondylitis of right elbow 04/17/2015  . Degenerative lumbar disc 12/30/2014  . Cough   . Degenerative joint disease involving multiple joints on both sides of body (right foot pain) 08/14/2014  . Chronic low back pain 04/15/2014  . Hot flashes 01/02/2014  . Diastolic CHF (HCC) 07/11/2013  . Fibromyalgia syndrome 06/18/2013  . Abdominal pain, epigastric 03/29/2013  . Myalgia 12/21/2012  . Chronic chest pain 11/21/2012  . Carpal tunnel syndrome, bilateral 07/25/2012  . Tension headache, chronic 05/24/2012  . Iron deficiency anemia 09/22/2011  . Depression 07/14/2011  . Anxiety 06/02/2011  . Lumbar spondylosis with myelopathy 11/20/2010  . Coronary atherosclerosis 03/10/2010  . Dyslipidemia 12/11/2008  . Vitamin D deficiency 11/26/2008  . Controlled type 2 diabetes mellitus with insulin therapy (HCC) 05/25/2007  . PERIPHERAL NEUROPATHY 05/25/2007  . Essential hypertension 05/25/2007  . GERD 05/25/2007  . SYMPTOM, APNEA, SLEEP NOS 05/25/2007   OCCUPATIONAL THERAPY DISCHARGE SUMMARY  Visits from Start   of Care:  14  Current functional level related to goals / functional outcomes: See above goals   Remaining deficits: None   Education / Equipment: HEP Plan: Patient agrees to discharge.  Patient goals were met. Patient is being discharged due to meeting the stated rehab goals.  ?????     Quay Burow, OTR/L 08/20/2015, 10:10 AM  [facility] [address] [place], [place], [address] Phone: [phone]   Fax:  [phone]  Name: Naina Sleeper MRN: [id number] Date of Birth: [birth year]

## 2015-08-21 ENCOUNTER — Ambulatory Visit (INDEPENDENT_AMBULATORY_CARE_PROVIDER_SITE_OTHER): Payer: 59 | Admitting: Clinical

## 2015-08-21 ENCOUNTER — Encounter (HOSPITAL_COMMUNITY): Payer: Self-pay | Admitting: Clinical

## 2015-08-21 DIAGNOSIS — F331 Major depressive disorder, recurrent, moderate: Secondary | ICD-10-CM | POA: Diagnosis not present

## 2015-08-21 NOTE — Progress Notes (Signed)
   THERAPIST PROGRESS NOTE  Session Time:  8:00 - 8:56  Participation Level: Active  Behavioral Response: CasualDrowsyAnxious and Depressed  Type of Therapy: Individual Therapy  Treatment Goals addressed: Improve Psychiatric Symptoms,  Emotional Regulation Skills, Interpersonal Relationship Skills  Interventions: Motivational Interviewing, CBT, Grounding & Mindfulness Techniques  Summary: [name] is a 64 y.o. female who presents with Major Depressive Disorder.   Suicidal/Homicidal: No -without intent/plan  Therapist Response:  [name] met with clinician for an individual session. [name] shared about her psychiatric symptoms, her current life events and her homework. [name] shared that she has been experiencing a lot of grief since last session. She shared that she has her daughters stuff from her Onnie Boer on a table and has not been able to bring herself to move it. Client and clinician discussed the importance of allowing time for her grief. Client and clinician also discussed the possibility of intentionally creating a space for her daughters objects. [name] spoke about the trip with her husband to the beach when they let the ashes go. Her mood brightened as she spoke about how well they had treated each other. [name] shared that she had not yet made attempts to increase her social situation but some had called to invite her out. While she did not go to most invites she shared that she had gone to one or two. When asked open ended questions about the outings she shared that her mood brightened. She stated that she feels better after interacting with others but has a difficult time saying yes to such interactions because she feels like an imposition to others. Client and clinician discussed the evidence for and against this thought. [name] was able to formulate a healthier new thought "they invited her because they enjoyed her company" however she said she couldn't really trust that it was true.  [name] and clinician discussed some basic cbt concepts - how her thoughts affect her interpretations. Clinician used her Newsoms baby as an example - she expects he loves her and spending time with her so she responds by doing so. [name] agreed to continue her homework. She stated that she plans to write more of her gratitude.    Plan: Return again in 1-2 weeks.  Diagnosis:     Axis I: Major Depressive Disorder    Anna Livers A, LCSW 08/21/2015

## 2015-09-01 DIAGNOSIS — R110 Nausea: Secondary | ICD-10-CM | POA: Diagnosis not present

## 2015-09-01 DIAGNOSIS — R630 Anorexia: Secondary | ICD-10-CM | POA: Diagnosis not present

## 2015-09-01 DIAGNOSIS — R634 Abnormal weight loss: Secondary | ICD-10-CM | POA: Diagnosis not present

## 2015-09-04 ENCOUNTER — Ambulatory Visit (HOSPITAL_COMMUNITY): Payer: Self-pay | Admitting: Clinical

## 2015-09-10 ENCOUNTER — Ambulatory Visit (INDEPENDENT_AMBULATORY_CARE_PROVIDER_SITE_OTHER): Payer: Medicare Other | Admitting: Internal Medicine

## 2015-09-10 VITALS — BP 147/71 | HR 80 | Temp 98.2°F | Wt 185.4 lb

## 2015-09-10 DIAGNOSIS — K219 Gastro-esophageal reflux disease without esophagitis: Secondary | ICD-10-CM

## 2015-09-10 DIAGNOSIS — I10 Essential (primary) hypertension: Secondary | ICD-10-CM | POA: Diagnosis not present

## 2015-09-10 DIAGNOSIS — F329 Major depressive disorder, single episode, unspecified: Secondary | ICD-10-CM | POA: Diagnosis not present

## 2015-09-10 DIAGNOSIS — F32A Depression, unspecified: Secondary | ICD-10-CM

## 2015-09-10 MED ORDER — METOPROLOL SUCCINATE ER 25 MG PO TB24: 25.0000 mg | ORAL_TABLET | Freq: Every morning | ORAL | Status: DC

## 2015-09-10 NOTE — Progress Notes (Signed)
Subjective:   Patient ID: [name] female   DOB: [birth year] 64 y.o.   MRN: [id number]  HPI: [name] is a 64 y.o. w/ PMHx as outlined below who presents for BP check.   Please see problem list for status of the pt's chronic medical problems.  Past Medical History  Diagnosis Date  . Hypertension   . Peripheral neuropathy (HCC)   . Cervicalgia   . Low back pain syndrome   . Hyperlipidemia   . Vitamin D deficiency   . Vitamin B12 deficiency   . Iliotibial band syndrome   . Anemia   . Borderline personality disorder   . Nonorganic psychosis   . GERD (gastroesophageal reflux disease)   . Diabetes mellitus   . Depression   . Diabetic gastroparesis (HCC)   . CAD (coronary artery disease)     Catherization 11/18/09>nonobstructive CAD , normal LV function, EF 65%  . Health maintenance examination     Colonoscopy 2009> normal; Diabetic eye exam 12/2008. No diabetic retinopathy; Mammogram 11/10> No evidence of malignancy, DXA 03/14/13 : normal  . Headache(784.0)   . Arthritis   . SBO (small bowel obstruction) (HCC) 01/09/2013  . Diastolic CHF (HCC)     Grade I, on Echo 06/2013, EF 55-60%  . Sleep apnea     does not wear CPAP   . Diabetes mellitus, type II (HCC)   . Wears glasses   . Wears dentures     upper  . Gout   . Right carpal tunnel syndrome 09/13/2014   Current Outpatient Prescriptions  Medication Sig Dispense Refill  . ACCU-CHEK AVIVA PLUS test strip TEST UPTO 4 TIMES DAILY 125 each 5  . ACCU-CHEK FASTCLIX LANCETS MISC TEST five times a day 102 each 11  . acetaminophen-codeine (TYLENOL #4) 300-60 MG per tablet Take 2 tablets by mouth every 8 (eight) hours as needed for moderate pain. 180 tablet 2  . albuterol (PROVENTIL HFA;VENTOLIN HFA) 108 (90 BASE) MCG/ACT inhaler Inhale 2 puffs into the lungs every 6 (six) hours as needed for wheezing or shortness of breath. 1 Inhaler 2  . aspirin 81 MG chewable tablet Chew 81 mg by mouth daily.    . BD PEN NEEDLE NANO U/F  32G X 4 MM MISC USE UPTO 5 TIMES DAILY 100 each 4  . BISACODYL PO Take 100 mg by mouth daily as needed.    Marland Kitchen buPROPion (WELLBUTRIN XL) 150 MG 24 hr tablet Take 3 tablets (450 mg total) by mouth daily. 270 tablet 1  . clonazePAM (KLONOPIN) 1 MG tablet 1 tid (Patient taking differently: 1 mg 3 (three) times daily as needed (morning, evening, bedtime). 1 tid) 90 tablet 3  . cloNIDine (CATAPRES - DOSED IN MG/24 HR) 0.3 mg/24hr patch Place 1 patch (0.3 mg total) onto the skin every 7 (seven) days. 4 patch 11  . dexlansoprazole (DEXILANT) 60 MG capsule Take 1 capsule (60 mg total) by mouth daily. For reflux 30 capsule   . diclofenac sodium (VOLTAREN) 1 % GEL Apply 2 g topically 4 (four) times daily. 3 Tube 1  . Insulin Glargine (LANTUS SOLOSTAR) 100 UNIT/ML Solostar Pen Inject 51 Units into the skin at bedtime. 45 mL 4  . isosorbide mononitrate (IMDUR) 60 MG 24 hr tablet Take 1 tablet (60 mg total) by mouth every morning. 90 tablet 4  . losartan (COZAAR) 50 MG tablet Take 1 tablet (50 mg total) by mouth daily. 90 tablet 4  . metoprolol succinate (TOPROL-XL) 25 MG 24  hr tablet Take 0.5 tablets (12.5 mg total) by mouth every morning. 90 tablet 4  . ondansetron (ZOFRAN) 4 MG tablet Take 4 mg by mouth every 8 (eight) hours as needed for nausea.     . rosuvastatin (CRESTOR) 10 MG tablet Take 1 tablet (10 mg total) by mouth at bedtime. 30 tablet 11  . sertraline (ZOLOFT) 100 MG tablet Take 1 tablet (100 mg total) by mouth daily. 90 tablet 1  . simethicone (GAS-X) 80 MG chewable tablet Chew 1 tablet (80 mg total) by mouth 4 (four) times daily as needed for flatulence. 100 tablet 2  . sitaGLIPtin (JANUVIA) 50 MG tablet Take 1 tablet (50 mg total) by mouth daily. 90 tablet 4  . traZODone (DESYREL) 50 MG tablet Take 1 tablet (50 mg total) by mouth at bedtime. 90 tablet 1   No current facility-administered medications for this visit.   Family History  Problem Relation Age of Onset  . Diabetes Mother   .  Hypertension Mother   . Hyperlipidemia Mother   . Arthritis Mother   . Depression Mother   . Heart disease Father   . Diabetes Brother   . Heart disease Brother   . Heart disease Paternal Grandmother   . Diabetes Brother    Social History   Social History  . Marital Status: Married    Spouse Name: N/A  . Number of Children: N/A  . Years of Education: N/A   Social History Main Topics  . Smoking status: Former Smoker    Types: Cigarettes    Quit date: 10/25/2002  . Smokeless tobacco: Never Used  . Alcohol Use: No  . Drug Use: No  . Sexual Activity: Not Currently   Other Topics Concern  . Not on file   Social History Narrative   Review of Systems: Review of Systems  Constitutional: Negative for weight loss.  Eyes: Positive for blurred vision (gradually, appt w/ eye dr. in Feb.).  Respiratory: Positive for cough (intermittent x few months). Negative for sputum production and shortness of breath.   Cardiovascular: Negative for chest pain.  Gastrointestinal: Positive for heartburn (chronic), nausea (chronic), vomiting (chronic), abdominal pain (chronic, unchanged) and constipation (takes daily laxatives ). Negative for diarrhea and blood in stool.  Genitourinary: Negative for dysuria, frequency and hematuria.  Neurological: Positive for headaches (everyday x 3 weeks, does away with sleep or rest).  Psychiatric/Behavioral: Positive for depression.   Objective:  Physical Exam: Filed Vitals:   09/10/15 1405  BP: 147/71  Pulse: 80  Temp: 98.2 F (36.8 C)  TempSrc: Oral  Weight: 185 lb 6.4 oz (84.097 kg)  SpO2: 98%   Physical Exam  Constitutional: She appears well-developed and well-nourished. No distress.  HENT:  Head: Normocephalic.  Mouth/Throat: Oropharynx is clear and moist. No oropharyngeal exudate.  Eyes: Conjunctivae are normal. Pupils are equal, round, and reactive to light.  Cardiovascular: Normal rate, regular rhythm and normal heart sounds.   No murmur  heard. Pulmonary/Chest: Effort normal and breath sounds normal. No respiratory distress. She has no wheezes.  Abdominal: Soft. Bowel sounds are normal. She exhibits no distension. There is no rebound.  Musculoskeletal: She exhibits no edema.  Lymphadenopathy:    She has no cervical adenopathy.  Neurological: She is alert.  Skin: Skin is warm and dry.  Psychiatric: She has a normal mood and affect.   Assessment & Plan:   Please see problem based assessment and plan.

## 2015-09-10 NOTE — Assessment & Plan Note (Signed)
Pt states she has reflux of "bile" and states she was told she had bile in her stomach by GI who she follows for her chronic abdominal pain with neg work up to date. She was told by GI to [facility] see Dr. Merri BrunetteFina for a second opinion for her pain. However, her main complaints today are more GERD related: globus sensation, reflux when bending over, cough, and abd bloating. She is taking dexilant and has failed prilosec and nexium.   - Will refer to ENT for evaluation of acid reflux - advised to sleep with a wedge pillow to keep upper body elevated - also counseled on depression and stress as factors that worsen GERD

## 2015-09-10 NOTE — Assessment & Plan Note (Addendum)
BP Readings from Last 3 Encounters:  09/10/15 147/71  08/13/15 120/54  08/05/15 124/60    Lab Results  Component Value Date   NA 137 06/02/2015   K 4.5 06/02/2015   CREATININE 1.69* 06/02/2015    Assessment: Blood pressure control:  not well controlled Progress toward BP goal:   near goal Comments: decreased lopressor dose last visit due to possibility of causing fatigue however pt has not noticed any difference in fatigue.   Plan: Medications: increase metoprolol dose back to 25mg . Educational resources provided:   Self management tools provided:   Other plans: f/u in 2 months for BP check when she gets her HbA1c

## 2015-09-10 NOTE — Assessment & Plan Note (Signed)
Pt has depression and recent grief over the death of her daughter. She follows w/ Dr. Donell BeersPlovsky who she will see in 2 weeks as well as with grief counseling. Informed pt of affects depression can cause on her whole body as she has multiple complaints with negative work ups and multiple providersw/ different specialities involved in her care. Will continue to monitor.

## 2015-09-10 NOTE — Patient Instructions (Signed)
Start taking metoprolol 25mg  daily.   I have referred you to [facility] for your acid reflux. They will call you for an appointment.

## 2015-09-11 NOTE — Progress Notes (Signed)
Internal Medicine Clinic Attending  Case discussed with Dr. Truong soon after the resident saw the patient.  We reviewed the resident's history and exam and pertinent patient test results.  I agree with the assessment, diagnosis, and plan of care documented in the resident's note. 

## 2015-09-17 ENCOUNTER — Ambulatory Visit (INDEPENDENT_AMBULATORY_CARE_PROVIDER_SITE_OTHER): Payer: Medicaid Other | Admitting: Clinical

## 2015-09-17 ENCOUNTER — Encounter (HOSPITAL_COMMUNITY): Payer: Self-pay | Admitting: Clinical

## 2015-09-17 DIAGNOSIS — F331 Major depressive disorder, recurrent, moderate: Secondary | ICD-10-CM

## 2015-09-17 NOTE — Progress Notes (Signed)
   THERAPIST PROGRESS NOTE  Session Time: 8:02 - 9:00  Participation Level: Active  Behavioral Response: NeatAlertDepressed  Type of Therapy: Individual Therapy  Treatment Goals addressed: Improve Psychiatric Symptoms, Calming Skills, Emotional Regulation Skills, Interpersonal Relationship Skills  Interventions: Motivational Interviewing, CBT, Grounding & Mindfulness Techniques  Summary: [name] is a 64 y.o. female who presents with Major Depressive Disorder.   Suicidal/Homicidal: No -without intent/plan  Therapist Response:  [name] met with clinician for an individual session. [name] shared about her psychiatric symptoms, her current life events and her homework. [name] shared that she has also been going to grief counseling. She shared she had planned to do something with her daughters picture and objects but every time she starts she is unable to finish. Client and clinician discussed grief as a process and being kind to herself through the process. She shared that she has avoided talking about her daughter at grief counseling but has made a decision to stay on the subject of her next session. She shared that she is very lonely at home. She shared that he husband is drinking more and does not converse with her. She shared that she really misses having adult conversation. She shared that he does not listen. She gave an example of falling in her room and not being able to get up. She called and cried for her husband but he did not come. She was stuck on the floor until her grandbaby came in and she was able to direct him to go get her husband. She shared that she has been talking to her friend Mother Inez Catalina from church which gives her some adult conversation, but she feels unsafe to share her true feelings and experiences because of how people at church talk. Client and clinician discussed the importance of relationships. [name] shared about others, like her mother, who she has been isolating  from. She shared that isolating makes her lonely and being lonely makes her want to isolate. Client and clinician discussed what might happen if she broke this cycle, even if she didn't share her deepest feelings. [name] agreed to try to be more social and to continue to practice her grounding skills until next session.   Plan: Return again in 1-2 weeks.  Diagnosis:     Axis I: Major Depressive Disorder   Efrat Zuidema A, LCSW 09/17/2015

## 2015-09-22 DIAGNOSIS — K219 Gastro-esophageal reflux disease without esophagitis: Secondary | ICD-10-CM | POA: Diagnosis not present

## 2015-09-23 ENCOUNTER — Ambulatory Visit (HOSPITAL_COMMUNITY)
Admission: RE | Admit: 2015-09-23 | Discharge: 2015-09-23 | Disposition: A | Discharge status: Home / Self Care | Payer: Medicare Other | Source: Ambulatory Visit | Attending: Internal Medicine | Admitting: Internal Medicine

## 2015-09-23 DIAGNOSIS — R93421 Abnormal radiologic findings on diagnostic imaging of right kidney: Secondary | ICD-10-CM | POA: Insufficient documentation

## 2015-09-23 DIAGNOSIS — N183 Chronic kidney disease, stage 3 unspecified: Secondary | ICD-10-CM

## 2015-09-23 DIAGNOSIS — R93422 Abnormal radiologic findings on diagnostic imaging of left kidney: Secondary | ICD-10-CM | POA: Diagnosis not present

## 2015-09-23 DIAGNOSIS — N189 Chronic kidney disease, unspecified: Secondary | ICD-10-CM | POA: Diagnosis not present

## 2015-09-24 ENCOUNTER — Ambulatory Visit (INDEPENDENT_AMBULATORY_CARE_PROVIDER_SITE_OTHER): Payer: 59 | Admitting: Psychiatry

## 2015-09-24 ENCOUNTER — Encounter: Payer: Self-pay | Admitting: Internal Medicine

## 2015-09-24 VITALS — BP 138/82 | HR 75 | Ht 65.0 in | Wt 186.2 lb

## 2015-09-24 DIAGNOSIS — F331 Major depressive disorder, recurrent, moderate: Secondary | ICD-10-CM | POA: Diagnosis not present

## 2015-09-24 MED ORDER — BUPROPION HCL ER (XL) 150 MG PO TB24: 450.0000 mg | ORAL_TABLET | Freq: Every day | ORAL | Status: DC

## 2015-09-24 MED ORDER — TRAZODONE HCL 50 MG PO TABS: 50.0000 mg | ORAL_TABLET | Freq: Every day | ORAL | Status: DC

## 2015-09-24 MED ORDER — SERTRALINE HCL 100 MG PO TABS: ORAL_TABLET | ORAL | Status: DC

## 2015-09-24 MED ORDER — CLONAZEPAM 1 MG PO TABS: 1.0000 mg | ORAL_TABLET | Freq: Three times a day (TID) | ORAL | Status: DC | PRN

## 2015-09-24 NOTE — Progress Notes (Signed)
[facility] MD Progress Note  09/24/2015 4:11 PM [name]  MRN:  [id number] Subjective: Not much better  rincipal Problem: major depression chronic mild  Diagnosis:   Major depression chronic, mild This patient is still grieving. She went to hospice 3 times. She still grieving over her daughter died suddenly. She'll 1 child who recently is gotten out of prison. He was in prison for 8 years. As best she can tell he is doing okay. He apparently is working. Her grandson also was in jail but recently got out as well. The patient spends a lot of her time taking care of her great grandson is 14 years old. The patient says that she feels depressed and anxious. Increase her Klonopin to 3 mg barely had an effect she sleeps and eats fairly well. The patient is gaining weight back. She's up 281 pounds. The patient enjoys TV but not much more. She doesn't read. She has a fairly good sense of worth and is not suicidal. She does not drink any alcohol use any drugs. She's very pessimistic and negative about life. She sees her therapist every other week. She's having a difficult time getting back into the run of things. Her husband continues to get her to go on trips and she reluctantly use. She doesn't seem to get as much joy out of much. She still feels like she is grieving. Patient Active Problem List   Diagnosis Date Noted  . Secondary hyperparathyroidism of renal origin (HCC) [N25.81] 08/08/2015  . CKD (chronic kidney disease) stage 3, GFR 30-59 ml/min [N18.3] 08/05/2015  . Acute gout [M10.9] 06/30/2015  . Preventative health care [Z00.00] 06/18/2015  . Lateral epicondylitis of right elbow [M77.11] 04/17/2015  . Degenerative lumbar disc [M51.36] 12/30/2014  . Cough [R05]   . Degenerative joint disease involving multiple joints on both sides of body (right foot pain) [M15.9] 08/14/2014  . Chronic low back pain [M54.5, G89.29] 04/15/2014  . Hot flashes [N95.1] 01/02/2014  . Diastolic CHF (HCC) [I50.30] 07/11/2013   . Fibromyalgia syndrome [M79.7] 06/18/2013  . Abdominal pain, epigastric [R10.13] 03/29/2013  . Myalgia [M79.1] 12/21/2012  . Chronic chest pain [R07.9, G89.29] 11/21/2012  . Carpal tunnel syndrome, bilateral [G56.03] 07/25/2012  . Tension headache, chronic [G44.229] 05/24/2012  . Iron deficiency anemia [D50.9] 09/22/2011  . Depression [F32.9] 07/14/2011  . Anxiety [F41.9] 06/02/2011  . Lumbar spondylosis with myelopathy [M47.16] 11/20/2010  . Coronary atherosclerosis [I25.10] 03/10/2010  . Dyslipidemia [E78.5] 12/11/2008  . Vitamin D deficiency [E55.9] 11/26/2008  . Controlled type 2 diabetes mellitus with insulin therapy (HCC) [E11.9, Z79.4] 05/25/2007  . PERIPHERAL NEUROPATHY [G60.9] 05/25/2007  . Essential hypertension [I10] 05/25/2007  . GERD [K21.9] 05/25/2007  . SYMPTOM, APNEA, SLEEP NOS [G47.30] 05/25/2007   Total Time spent with patient: 30 minutes  Past Psychiatric History:   Past Medical History:  Past Medical History  Diagnosis Date  . Hypertension   . Peripheral neuropathy (HCC)   . Cervicalgia   . Low back pain syndrome   . Hyperlipidemia   . Vitamin D deficiency   . Vitamin B12 deficiency   . Iliotibial band syndrome   . Anemia   . Borderline personality disorder   . Nonorganic psychosis   . GERD (gastroesophageal reflux disease)   . Diabetes mellitus   . Depression   . Diabetic gastroparesis (HCC)   . CAD (coronary artery disease)     Catherization 11/18/09>nonobstructive CAD , normal LV function, EF 65%  . Health maintenance examination  Colonoscopy 2009> normal; Diabetic eye exam 12/2008. No diabetic retinopathy; Mammogram 11/10> No evidence of malignancy, DXA 03/14/13 : normal  . Headache(784.0)   . Arthritis   . SBO (small bowel obstruction) (HCC) 01/09/2013  . Diastolic CHF (HCC)     Grade I, on Echo 06/2013, EF 55-60%  . Sleep apnea     does not wear CPAP   . Diabetes mellitus, type II (HCC)   . Wears glasses   . Wears dentures     upper   . Gout   . Right carpal tunnel syndrome 09/13/2014    Past Surgical History  Procedure Laterality Date  . Abdominal hysterectomy    . Hand surgery  1992    rt  . Bowel resection N/A 01/10/2013    Procedure: SMALL BOWEL RESECTION;  Surgeon: Lodema Pilot, DO;  Location: [facility] OR;  Service: General;  Laterality: N/A;  . Lysis of adhesion N/A 01/10/2013    Procedure: LYSIS OF ADHESION;  Surgeon: Lodema Pilot, DO;  Location: [facility] OR;  Service: General;  Laterality: N/A;  . Laparotomy N/A 01/10/2013    Procedure: EXPLORATORY LAPAROTOMY;  Surgeon: Lodema Pilot, DO;  Location: [facility] OR;  Service: General;  Laterality: N/A;  . Incisional hernia repair N/A 03/01/2014    Procedure: LAPAROSCOPIC INCISIONAL HERNIA;  Surgeon: Axel Filler, MD;  Location: [facility];  Service: General;  Laterality: N/A;  . Insertion of mesh N/A 03/01/2014    Procedure: INSERTION OF MESH;  Surgeon: Axel Filler, MD;  Location: [facility];  Service: General;  Laterality: N/A;  . Hernia repair  03/01/14    Lap incisional hernia repair w/mesh  . Appendectomy    . Carpal tunnel release Right 09/13/2014    Procedure: RIGHT WRIST CARPAL TUNNEL RELEASE;  Surgeon: Eulas Post, MD;  Location: [facility];  Service: Orthopedics;  Laterality: Right;  . Steriod injection Left 09/13/2014    Procedure: STEROID INJECTION LEFT HAND;  Surgeon: Eulas Post, MD;  Location: [facility];  Service: Orthopedics;  Laterality: Left;  . Carpal tunnel release Left 02/28/2015    Procedure: LEFT CARPAL TUNNEL RELEASE;  Surgeon: Teryl Lucy, MD;  Location: [facility];  Service: Orthopedics;  Laterality: Left;   Family History:  Family History  Problem Relation Age of Onset  . Diabetes Mother   . Hypertension Mother   . Hyperlipidemia Mother   . Arthritis Mother   . Depression Mother   . Heart disease Father   . Diabetes Brother   . Heart disease Brother   . Heart disease Paternal Grandmother   . Diabetes  Brother    Family Psychiatric  History:  Social History:  History  Alcohol Use No     History  Drug Use No    Social History   Social History  . Marital Status: Married    Spouse Name: N/A  . Number of Children: N/A  . Years of Education: N/A   Social History Main Topics  . Smoking status: Former Smoker    Types: Cigarettes    Quit date: 10/25/2002  . Smokeless tobacco: Never Used  . Alcohol Use: No  . Drug Use: No  . Sexual Activity: Not Currently   Other Topics Concern  . Not on file   Social History Narrative   Additional Social History:                         Sleep: Fair  Appetite:  Good  Current Medications: Current Outpatient Prescriptions  Medication Sig Dispense Refill  . ACCU-CHEK AVIVA PLUS test strip TEST UPTO 4 TIMES DAILY 125 each 5  . ACCU-CHEK FASTCLIX LANCETS MISC TEST five times a day 102 each 11  . acetaminophen-codeine (TYLENOL #4) 300-60 MG per tablet Take 2 tablets by mouth every 8 (eight) hours as needed for moderate pain. 180 tablet 2  . albuterol (PROVENTIL HFA;VENTOLIN HFA) 108 (90 BASE) MCG/ACT inhaler Inhale 2 puffs into the lungs every 6 (six) hours as needed for wheezing or shortness of breath. 1 Inhaler 2  . aspirin 81 MG chewable tablet Chew 81 mg by mouth daily.    . BD PEN NEEDLE NANO U/F 32G X 4 MM MISC USE UPTO 5 TIMES DAILY 100 each 4  . BISACODYL PO Take 100 mg by mouth daily as needed.    Marland Kitchen. buPROPion (WELLBUTRIN XL) 150 MG 24 hr tablet Take 3 tablets (450 mg total) by mouth daily. 270 tablet 1  . clonazePAM (KLONOPIN) 1 MG tablet Take 1 tablet (1 mg total) by mouth 3 (three) times daily as needed (morning, evening, bedtime). 1 tid 90 tablet 3  . cloNIDine (CATAPRES - DOSED IN MG/24 HR) 0.3 mg/24hr patch Place 1 patch (0.3 mg total) onto the skin every 7 (seven) days. 4 patch 11  . dexlansoprazole (DEXILANT) 60 MG capsule Take 1 capsule (60 mg total) by mouth daily. For reflux 30 capsule   . diclofenac sodium  (VOLTAREN) 1 % GEL Apply 2 g topically 4 (four) times daily. 3 Tube 1  . Insulin Glargine (LANTUS SOLOSTAR) 100 UNIT/ML Solostar Pen Inject 51 Units into the skin at bedtime. 45 mL 4  . isosorbide mononitrate (IMDUR) 60 MG 24 hr tablet Take 1 tablet (60 mg total) by mouth every morning. 90 tablet 4  . losartan (COZAAR) 50 MG tablet Take 1 tablet (50 mg total) by mouth daily. 90 tablet 4  . metoprolol succinate (TOPROL-XL) 25 MG 24 hr tablet Take 1 tablet (25 mg total) by mouth every morning. 90 tablet 4  . ondansetron (ZOFRAN) 4 MG tablet Take 4 mg by mouth every 8 (eight) hours as needed for nausea.     . rosuvastatin (CRESTOR) 10 MG tablet Take 1 tablet (10 mg total) by mouth at bedtime. 30 tablet 11  . sertraline (ZOLOFT) 100 MG tablet 1 and a half qam 135 tablet 1  . simethicone (GAS-X) 80 MG chewable tablet Chew 1 tablet (80 mg total) by mouth 4 (four) times daily as needed for flatulence. 100 tablet 2  . sitaGLIPtin (JANUVIA) 50 MG tablet Take 1 tablet (50 mg total) by mouth daily. 90 tablet 4  . traZODone (DESYREL) 50 MG tablet Take 1 tablet (50 mg total) by mouth at bedtime. 90 tablet 1   No current facility-administered medications for this visit.    Lab Results: No results found. However, due to the size of the patient record, not all encounters were searched. Please check Results Review for a complete set of results.  Physical Findings: AIMS:  , ,  ,  ,    CIWA:    COWS:     Musculoskeletal: Strength & Muscle Tone: within normal limits Gait & Station: normal Patient leans: Right  Psychiatric Specialty Exam: ROS  Blood pressure 138/82, pulse 75, height 5\' 5"  (1.651 m), weight 186 lb 3.2 oz (84.46 kg).Body mass index is 30.99 kg/(m^2).  General Appearance: Fairly Groomed  Patent attorneyye Contact::  Good  Speech:  Blocked and  Clear and Coherent  Volume:  Normal  Mood:  Anxious  Affect:  Depressed  Thought Process:  Goal Directed  Orientation:  Full (Time, Place, and Person)  Thought  Content:  WDL  Suicidal Thoughts:  No  Homicidal Thoughts:  No  Memory:  NA  Judgement:  Good  Insight:  Good  Psychomotor Activity  Concentration: fair   Recall: fair   Fund of Knowledge: Poor   Language: Good  Akathisia:  No  Handed:  Right  AIMS (if indicated):     Assets:  Communication Skills  ADL's:  Intact  Cognition: fair  Sleep:      Treatment Plan Summary: At this time we will increase this patient's Zoloft 150 mg. She'll continue taking Wellbutrin 450 mg. She'll continue taking Klonopin 1 mg 3 times a day. The patient will continue in therapy. Patient denies chest pain or shortness of breath. She denies any abdominal pain. She denies being suicidal. She denies any neurological symptoms. Patient continues to be in grieving mode. She'll continue her antidepressants with slight increase. The patient is not suicidal.   Jeffie Widdowson IRVING 09/24/2015, 4:11 PM

## 2015-09-29 ENCOUNTER — Ambulatory Visit: Payer: Medicare Other | Admitting: Physical Medicine & Rehabilitation

## 2015-09-30 ENCOUNTER — Ambulatory Visit (HOSPITAL_COMMUNITY): Payer: Self-pay | Admitting: Clinical

## 2015-10-14 ENCOUNTER — Ambulatory Visit (INDEPENDENT_AMBULATORY_CARE_PROVIDER_SITE_OTHER): Payer: 59 | Admitting: Clinical

## 2015-10-14 ENCOUNTER — Encounter (HOSPITAL_COMMUNITY): Payer: Self-pay | Admitting: Clinical

## 2015-10-14 DIAGNOSIS — F331 Major depressive disorder, recurrent, moderate: Secondary | ICD-10-CM

## 2015-10-14 NOTE — Progress Notes (Signed)
THERAPIST PROGRESS NOTE  Session Time: 8:04 - 9:00  Participation Level: Active  Behavioral Response: Neat and Well GroomedAlertHappier  Type of Therapy: Individual Therapy  Treatment Goals addressed: Improve Psychiatric Symptoms, Calming Skills, Emotional Regulation Skills, Interpersonal Relationship Skills  Interventions: Motivational Interviewing, CBT, Grounding & Mindfulness Techniques  Summary: [name] is a 64 y.o. female who presents with Major Depressive Disorder.   Suicidal/Homicidal: No -without intent/plan  Therapist Response:  [name] met with clinician for an individual session. [name] shared about her psychiatric symptoms, her current life events and her homework. [name] appeared happier and more polished than she has in a while. She wore earrings, stylish glasses and eye shadow. Clinician commented on clients fashion change and [name] shared that she has decided to make more of an effort to feel better and to present that to the world. [name] shared that her medication has been adjusted and that she feels like the change will help. [name] shared that she has has been thinking about the things discussed in therapy and decided to take some action. She shared that he husband continues to drink alcohol. She shared that this embarrasses her but has made the conscious decision to not be responsible for his behavior. She shared that she continues to be lonely at home when the grand baby isn't there. She that she made the decision and effort to connect with friends and visit her mother. She smiled when she shared that she Really enjoyed talking with others. She stated that she has made the decision to continue to be social. Client and clinician discussed her change and how it has affected her mood. She shared that it has helped her take her focus off her husband and has put it on her and her efforts. She shared that she had the insight that she had more power to improve her experience than  she thought she had. [name] also shared her homework. She shared her gratitude journal . She shared that she was especially grateful for her grandson who brings her joy and laughter. She stated that she was looking forward to going to her step sons house for Christmas. She agreed to continue her gratitude journal and to challenge herself to be more social - as homework until next session.   Plan: Return again in 1-2 weeks.  Diagnosis:     Axis I: Major Depressive Disorder    [name] A, LCSW 10/14/2015

## 2015-10-28 ENCOUNTER — Ambulatory Visit (HOSPITAL_COMMUNITY): Payer: Self-pay | Admitting: Clinical

## 2015-11-07 ENCOUNTER — Other Ambulatory Visit: Payer: Self-pay | Admitting: Internal Medicine

## 2015-11-11 ENCOUNTER — Encounter (HOSPITAL_COMMUNITY): Payer: Self-pay | Admitting: Clinical

## 2015-11-11 ENCOUNTER — Ambulatory Visit (INDEPENDENT_AMBULATORY_CARE_PROVIDER_SITE_OTHER): Payer: 59 | Admitting: Clinical

## 2015-11-11 DIAGNOSIS — F331 Major depressive disorder, recurrent, moderate: Secondary | ICD-10-CM

## 2015-11-11 NOTE — Progress Notes (Signed)
   THERAPIST PROGRESS NOTE  Session Time: 8:00 - 9:05  Participation Level: Active  Behavioral Response: CasualAlertDepressed and hopeful (fluctuated)  Type of Therapy: Individual Therapy  Treatment Goals addressed: Improve Psychiatric Symptoms, Calming Skills, Emotional Regulation Skills, Interpersonal Relationship Skills  Interventions: Motivational Interviewing, CBT  Summary: [name] is a 65 y.o. female who presents with Major Depressive Disorder.   Suicidal/Homicidal: No -without intent/plan  Therapist Response:  Breigh met with clinician for an individual session. Ceola shared about her psychiatric symptoms, her current life events and her homework. Rexanne shared that she had completed her homework packet. Client and clinician reviewed and discussed her homework packet. Karagan shared about the emotions she finds it difficult to feel and express.Zharia shared her thoughts and insights. She asked clarifying questions which clinician answered.  Lakeasha shared from her gratitude journal and about her grounding and mindfulness techniques.  Albina shared that she has continued to make effort to "put her self together" before going out. She stated that this has helped her mood. She excitedly told clinician that she began walking 1-2 times a week with her friend. She shared that this has been beneficial both for exercise and for companionship. She shared that she has continued to make an effort to be more social. She shared about the benefits and challenges of doing so. Client and clinician discussed her successes and the benefits. Client and clinician discussed how she could continue to improve. Kamylle shared that she is still having difficulty with her husband. She shared that she has been internalizing some anger and was afraid that she would later "explode". Client and clinician discussed her anger. Client and clinician discussed her negative automatic thoughts. Client and clinician discussed ways  that she could convey her concerns in a way he is more likely to hear her. Clinician asked open ended questions and Julieanna recognized that she did not have control over his thoughts and behaviors but she does over hers and her reactions.  Alailah agreed to complete the next packet (distress tolerance) prior to next session. She agreed to continue her gratitude journal and her grounding and mindfulness skills.  Plan: Return again in 1-2 weeks.  Diagnosis:     Axis I: Major Depressive Disorder   Garcia Dalzell A, LCSW 11/11/2015

## 2015-11-12 ENCOUNTER — Encounter: Payer: Self-pay | Admitting: Internal Medicine

## 2015-11-12 ENCOUNTER — Ambulatory Visit (INDEPENDENT_AMBULATORY_CARE_PROVIDER_SITE_OTHER): Payer: Medicare Other | Admitting: Internal Medicine

## 2015-11-12 VITALS — BP 161/85 | HR 73 | Temp 98.0°F | Ht 65.0 in | Wt 192.0 lb

## 2015-11-12 DIAGNOSIS — Z794 Long term (current) use of insulin: Secondary | ICD-10-CM | POA: Diagnosis not present

## 2015-11-12 DIAGNOSIS — F32A Depression, unspecified: Secondary | ICD-10-CM

## 2015-11-12 DIAGNOSIS — E119 Type 2 diabetes mellitus without complications: Secondary | ICD-10-CM

## 2015-11-12 DIAGNOSIS — G44229 Chronic tension-type headache, not intractable: Secondary | ICD-10-CM

## 2015-11-12 DIAGNOSIS — I10 Essential (primary) hypertension: Secondary | ICD-10-CM | POA: Diagnosis not present

## 2015-11-12 DIAGNOSIS — E1165 Type 2 diabetes mellitus with hyperglycemia: Secondary | ICD-10-CM

## 2015-11-12 DIAGNOSIS — G44221 Chronic tension-type headache, intractable: Secondary | ICD-10-CM

## 2015-11-12 DIAGNOSIS — Z7984 Long term (current) use of oral hypoglycemic drugs: Secondary | ICD-10-CM | POA: Diagnosis not present

## 2015-11-12 DIAGNOSIS — F329 Major depressive disorder, single episode, unspecified: Secondary | ICD-10-CM

## 2015-11-12 LAB — POCT GLYCOSYLATED HEMOGLOBIN (HGB A1C): Hemoglobin A1C: 7.5

## 2015-11-12 LAB — GLUCOSE, CAPILLARY: Glucose-Capillary: 124 mg/dL — ABNORMAL HIGH (ref 65–99)

## 2015-11-12 MED ORDER — SERTRALINE HCL 100 MG PO TABS: 150.0000 mg | ORAL_TABLET | Freq: Every day | ORAL | Status: DC

## 2015-11-12 MED ORDER — CYCLOBENZAPRINE HCL 5 MG PO TABS: 5.0000 mg | ORAL_TABLET | Freq: Every day | ORAL | Status: DC | PRN

## 2015-11-12 MED ORDER — LOSARTAN POTASSIUM 100 MG PO TABS: 100.0000 mg | ORAL_TABLET | Freq: Every day | ORAL | Status: DC

## 2015-11-12 NOTE — Patient Instructions (Signed)
Start taking losartan  daily.  Start taking flexeril  up to once a day as needed for your headaches.   Make sure to bring your glucometer with you to your next visit.

## 2015-11-12 NOTE — Assessment & Plan Note (Signed)
BP Readings from Last 3 Encounters:  11/12/15 161/85  09/24/15 138/82  09/10/15 147/71    Lab Results  Component Value Date   NA 137 06/02/2015   K 4.5 06/02/2015   CREATININE 1.69* 06/02/2015    Assessment: Blood pressure control:  elevated Progress toward BP goal:   deteriorated  Comments: pt is on losartan , lopressor25 mg daily  Plan: Medications:  increased losartan to  daily.  Educational resources provided:   Self management tools provided:   Other plans: f/u in 3 months, will need a BMET at that visit.

## 2015-11-12 NOTE — Assessment & Plan Note (Addendum)
Lab Results  Component Value Date   HGBA1C 7.5 11/12/2015   HGBA1C 6.3 08/05/2015   HGBA1C 6.1 04/17/2015     Assessment: Diabetes control:  not well controlled Progress toward A1C goal:   deteriorated  Comments: on januvia  and lantus 51 units at night. She only had one reading per month for this month, December and November ranging in the 120-140's.   Plan: Medications:  continue current medications, januvia cannot be increased due to decreased GFR.  Home glucose monitoring: Frequency:  TID Timing:  w/ meals Instruction/counseling given: reminded to get eye exam and reminded to bring blood glucose meter & log to each visit Educational resources provided: brochure Self management tools provided:   Other plans: f/u in 3 months

## 2015-11-12 NOTE — Progress Notes (Signed)
Subjective:   Patient ID: [name] female   DOB: [birth year] 65 y.o.   MRN: [id number]  HPI: [name] is a 65 y.o. with past medical history as outlined below who presents to clinic for DM f/u.   Please see problem list for status of the pt's chronic medical problems.  Past Medical History  Diagnosis Date  . Hypertension   . Peripheral neuropathy (HCC)   . Cervicalgia   . Low back pain syndrome   . Hyperlipidemia   . Vitamin D deficiency   . Vitamin B12 deficiency   . Iliotibial band syndrome   . Anemia   . Borderline personality disorder   . Nonorganic psychosis   . GERD (gastroesophageal reflux disease)   . Diabetes mellitus   . Depression   . Diabetic gastroparesis (HCC)   . CAD (coronary artery disease)     Catherization 11/18/09>nonobstructive CAD , normal LV function, EF 65%  . Health maintenance examination     Colonoscopy 2009> normal; Diabetic eye exam 12/2008. No diabetic retinopathy; Mammogram 11/10> No evidence of malignancy, DXA 03/14/13 : normal  . Headache(784.0)   . Arthritis   . SBO (small bowel obstruction) (HCC) 01/09/2013  . Diastolic CHF (HCC)     Grade I, on Echo 06/2013, EF 55-60%  . Sleep apnea     does not wear CPAP   . Diabetes mellitus, type II (HCC)   . Wears glasses   . Wears dentures     upper  . Gout   . Right carpal tunnel syndrome 09/13/2014   Current Outpatient Prescriptions  Medication Sig Dispense Refill  . ACCU-CHEK AVIVA PLUS test strip TEST UPTO 4 TIMES DAILY 125 each 5  . ACCU-CHEK FASTCLIX LANCETS MISC TEST five times a day 102 each 11  . albuterol (PROVENTIL HFA;VENTOLIN HFA) 108 (90 BASE) MCG/ACT inhaler Inhale 2 puffs into the lungs every 6 (six) hours as needed for wheezing or shortness of breath. 1 Inhaler 2  . aspirin 81 MG chewable tablet Chew 81 mg by mouth daily.    Marland Kitchen BISACODYL PO Take 100 mg by mouth daily as needed.    Marland Kitchen buPROPion (WELLBUTRIN XL) 150 MG 24 hr tablet Take 3 tablets (450 mg total) by mouth  daily. 270 tablet 1  . clonazePAM (KLONOPIN) 1 MG tablet Take 1 tablet (1 mg total) by mouth 3 (three) times daily as needed (morning, evening, bedtime). 1 tid 90 tablet 3  . cloNIDine (CATAPRES - DOSED IN MG/24 HR) 0.3 mg/24hr patch Place 1 patch (0.3 mg total) onto the skin every 7 (seven) days. 4 patch 11  . cyclobenzaprine (FLEXERIL) 5 MG tablet Take 1 tablet (5 mg total) by mouth daily as needed for muscle spasms (headache). 14 tablet 0  . dexlansoprazole (DEXILANT) 60 MG capsule Take 1 capsule (60 mg total) by mouth daily. For reflux 30 capsule   . diclofenac sodium (VOLTAREN) 1 % GEL Apply 2 g topically 4 (four) times daily. 3 Tube 1  . Insulin Glargine (LANTUS SOLOSTAR) 100 UNIT/ML Solostar Pen Inject 51 Units into the skin at bedtime. 45 mL 4  . Insulin Pen Needle (BD PEN NEEDLE NANO U/F) 32G X 4 MM MISC Use to inject insulin into the skin 100 each 6  . isosorbide mononitrate (IMDUR) 60 MG 24 hr tablet Take 1 tablet (60 mg total) by mouth every morning. 90 tablet 4  . losartan (COZAAR) 100 MG tablet Take 1 tablet (100 mg total) by mouth daily. 90 tablet  4  . metoprolol succinate (TOPROL-XL) 25 MG 24 hr tablet Take 1 tablet (25 mg total) by mouth every morning. 90 tablet 4  . ondansetron (ZOFRAN) 4 MG tablet Take 4 mg by mouth every 8 (eight) hours as needed for nausea.     . rosuvastatin (CRESTOR) 10 MG tablet Take 1 tablet (10 mg total) by mouth at bedtime. 30 tablet 11  . sertraline (ZOLOFT) 100 MG tablet Take 1.5 tablets (150 mg total) by mouth at bedtime. 1 and a half qam 135 tablet 1  . simethicone (GAS-X) 80 MG chewable tablet Chew 1 tablet (80 mg total) by mouth 4 (four) times daily as needed for flatulence. 100 tablet 2  . sitaGLIPtin (JANUVIA) 50 MG tablet Take 1 tablet (50 mg total) by mouth daily. 90 tablet 4  . traZODone (DESYREL) 50 MG tablet Take 1 tablet (50 mg total) by mouth at bedtime. 90 tablet 1   No current facility-administered medications for this visit.   Family  History  Problem Relation Age of Onset  . Diabetes Mother   . Hypertension Mother   . Hyperlipidemia Mother   . Arthritis Mother   . Depression Mother   . Heart disease Father   . Diabetes Brother   . Heart disease Brother   . Heart disease Paternal Grandmother   . Diabetes Brother    Social History   Social History  . Marital Status: Married    Spouse Name: N/A  . Number of Children: N/A  . Years of Education: N/A   Social History Main Topics  . Smoking status: Former Smoker    Types: Cigarettes    Quit date: 10/25/2002  . Smokeless tobacco: Never Used  . Alcohol Use: No  . Drug Use: No  . Sexual Activity: Not Currently   Other Topics Concern  . None   Social History Narrative   Review of Systems: Review of Systems  Constitutional: Negative for weight loss (weight stable).  Eyes: Positive for blurred vision (has visit w/ ophtho next month).  Cardiovascular: Negative for chest pain.  Gastrointestinal: Positive for heartburn (chronic, on dexilant ), nausea (uses zofran 5 days of the week) and abdominal pain (peri umbilical pain, chronic). Negative for vomiting.  Neurological: Positive for headaches (daily HAs since 08/2015).  Endo/Heme/Allergies: Positive for polydipsia.       Denies polyuria  Psychiatric/Behavioral: Positive for depression (states her mood has been better).    Objective:  Physical Exam: Filed Vitals:   11/12/15 1558  BP: 161/85  Pulse: 73  Temp: 98 F (36.7 C)  TempSrc: Oral  Height:  (1.651 m)  Weight: 192 lb (87.091 kg)  SpO2: 100%   Physical Exam  Constitutional: She appears well-developed and well-nourished. No distress.  HENT:  Head: Normocephalic and atraumatic.  Mouth/Throat: Oropharynx is clear and moist.  Eyes: Conjunctivae and EOM are normal. Right eye exhibits no discharge. Left eye exhibits no discharge. No scleral icterus.  Cardiovascular: Normal rate, regular rhythm and normal heart sounds.  Exam reveals no gallop  and no friction rub.   No murmur heard. Pulmonary/Chest: Effort normal and breath sounds normal. No respiratory distress. She has no wheezes. She has no rales.  Abdominal: Soft. Bowel sounds are normal. She exhibits no distension. There is no tenderness. There is no rebound and no guarding.  Musculoskeletal: She exhibits no edema.  Neurological: She is alert. No cranial nerve deficit (CN2-12 intact).  Intact sensation, 5/5 UE and LE strength, point tenderness at trapezius insertion  point of occipital skull  Skin: Skin is warm and dry. She is not diaphoretic.  Psychiatric:  Pt is more cheerful than all her previous visits      Assessment & Plan:   Please see problem based assessment and plan.

## 2015-11-14 DIAGNOSIS — G44229 Chronic tension-type headache, not intractable: Secondary | ICD-10-CM | POA: Insufficient documentation

## 2015-11-14 NOTE — Assessment & Plan Note (Signed)
Follows w/ psychiatry. States her mood is much better with recent medication changes by psych. Updated zoloft and wellbutrin doses on MAR.

## 2015-11-14 NOTE — Assessment & Plan Note (Addendum)
Pt fell while bending over in November and remembers waking up on the floor by her night stand. She did not have any urinary or bowel incontinence and was not confused when she woke up. Since then she has had daily headaches that occur at various times of the day. There were no focal deficits on neuro exam today, 5/5 UE and LE strength, and intact sensation. She was significantly tender when her occipital scalp region was palpated where the trapezius muscle inserts. Most likely her HAs are due to muscle strain.  - rx for flexeril  qd prn.

## 2015-11-14 NOTE — Progress Notes (Signed)
Internal Medicine Clinic Attending  Case discussed with Dr. Truong at the time of the visit.  We reviewed the resident's history and exam and pertinent patient test results.  I agree with the assessment, diagnosis, and plan of care documented in the resident's note.  

## 2015-11-25 ENCOUNTER — Ambulatory Visit (INDEPENDENT_AMBULATORY_CARE_PROVIDER_SITE_OTHER): Payer: 59 | Admitting: Clinical

## 2015-11-25 ENCOUNTER — Encounter (HOSPITAL_COMMUNITY): Payer: Self-pay | Admitting: Clinical

## 2015-11-25 DIAGNOSIS — F331 Major depressive disorder, recurrent, moderate: Secondary | ICD-10-CM | POA: Diagnosis not present

## 2015-11-25 NOTE — Progress Notes (Signed)
   THERAPIST PROGRESS NOTE  Session Time: 8:00 - 8:56  Participation Level: Active  Behavioral Response: CasualAlertDepressed and then happy  Type of Therapy: Individual Therapy  Treatment Goals addressed: Improve Psychiatric Symptoms, Calming Skills, Emotional Regulation Skills, Interpersonal Relationship Skills  Interventions: Motivational Interviewing, CBT,   Summary: [name] is a 65 y.o. female who presents with Major Depressive Disorder.   Suicidal/Homicidal: No -without intent/plan  Therapist Response:  [name] met with clinician for an individual session. [name] shared about her psychiatric symptoms, her current life events and her homework. [name] shared that she did not complete her homework because her hands have been hurting and she has not felt like writing. Client and clinician discussed her efforts to practice her skills. [name] shared that she has conciously been working on her interpersonal relationship skills. She shared that she told her husband that she would like more interaction and he responded by watching tv together. He asked he to pick out the movies Sunday. She shared that this was not perfect but better than before. She shared that she also told him how she felt about his drinking but he did not respond. Clinician asked open ended questions and [name] shared about her approach as well as how she felt about speaking up. [name] shared that she used her skills to help her be calm so that she felt good about the way she spoke up. She shared that even though he did not respond she felt less "weight" because she spoke her peace. [name] and clinician discussed the fact that his thoughts and actions belong to him, and hers to her. [name] and clinician discussed this fact in relationship to her response of feeling ashamed because of his drinking.  [name] shared that she has been making a continued effort to socialize. [place] [facility] [employer] [place] [facility]. She shared that  she has felt better having a friend to share and talk with. Client and clinician discussed the fact that [name]'s thoughts and activities are what is changing her mood. [name] stated that she could see that. She shared that she plans to continue her efforts.   Plan: Return again in 2 weeks.  Diagnosis:     Axis I: Major Depressive Disorder    Elyn Krogh A, LCSW 11/25/2015

## 2015-11-26 ENCOUNTER — Ambulatory Visit (INDEPENDENT_AMBULATORY_CARE_PROVIDER_SITE_OTHER): Payer: Medicare Other | Admitting: Internal Medicine

## 2015-11-26 ENCOUNTER — Encounter: Payer: Self-pay | Admitting: Internal Medicine

## 2015-11-26 DIAGNOSIS — R2681 Unsteadiness on feet: Secondary | ICD-10-CM

## 2015-11-26 LAB — GLUCOSE, CAPILLARY: Glucose-Capillary: 188 mg/dL — ABNORMAL HIGH (ref 65–99)

## 2015-11-26 MED ORDER — MECLIZINE HCL 32 MG PO TABS: 32.0000 mg | ORAL_TABLET | Freq: Three times a day (TID) | ORAL | Status: DC | PRN

## 2015-11-26 NOTE — Patient Instructions (Signed)
STOP taking flexeril.   You can take meclizine up to three times a day as needed for your unsteadiness.   We will get a MRI of your head and radiology will call you for an appointment.   Keep taking aspirin  everyday.   If your symptoms get worse call the clinic or go to the ED.    DO NOT DRIVE.

## 2015-11-26 NOTE — Progress Notes (Signed)
Subjective:   Patient ID: [name] female   DOB: 1951-01-09 65 y.o.   MRN: [id number]  HPI: [name] is a 65 y.o. with past medical history as outlined below who presents to clinic for unsteadiness for a few weeks that has gotten worse x 2 weeks. She states she feels like she has been staggering. She will try and reach for the door knob and then have to take 2 steps backwards. She also states she scratched the right side of her car 3 times in the past 2 weeks- once she ran into the trash can and another time she was driving around a parked truck and scratched her car on something which she is unsure of what it was but her husband states it was a screw. She denies feeling dizzy or the room spinning. She denies any new meds other than flexeril that was prescribed her last clinic visit on 11/12/2015 for muscle tension headaches.   Please see problem list for status of the pt's chronic medical problems.  Past Medical History  Diagnosis Date  . Hypertension   . Peripheral neuropathy (HCC)   . Cervicalgia   . Low back pain syndrome   . Hyperlipidemia   . Vitamin D deficiency   . Vitamin B12 deficiency   . Iliotibial band syndrome   . Anemia   . Borderline personality disorder   . Nonorganic psychosis   . GERD (gastroesophageal reflux disease)   . Diabetes mellitus   . Depression   . Diabetic gastroparesis (HCC)   . CAD (coronary artery disease)     Catherization 11/18/09>nonobstructive CAD , normal LV function, EF 65%  . Health maintenance examination     Colonoscopy 2009> normal; Diabetic eye exam 12/2008. No diabetic retinopathy; Mammogram 11/10> No evidence of malignancy, DXA 03/14/13 : normal  . Headache(784.0)   . Arthritis   . SBO (small bowel obstruction) (HCC) 01/09/2013  . Diastolic CHF (HCC)     Grade I, on Echo 06/2013, EF 55-60%  . Sleep apnea     does not wear CPAP   . Diabetes mellitus, type II (HCC)   . Wears glasses   . Wears dentures     upper  . Gout   .  Right carpal tunnel syndrome 09/13/2014   Current Outpatient Prescriptions  Medication Sig Dispense Refill  . ACCU-CHEK AVIVA PLUS test strip TEST UPTO 4 TIMES DAILY 125 each 5  . ACCU-CHEK FASTCLIX LANCETS MISC TEST five times a day 102 each 11  . albuterol (PROVENTIL HFA;VENTOLIN HFA) 108 (90 BASE) MCG/ACT inhaler Inhale 2 puffs into the lungs every 6 (six) hours as needed for wheezing or shortness of breath. 1 Inhaler 2  . aspirin 81 MG chewable tablet Chew 81 mg by mouth daily.    Marland Kitchen BISACODYL PO Take 100 mg by mouth daily as needed.    Marland Kitchen buPROPion (WELLBUTRIN XL) 150 MG 24 hr tablet Take 3 tablets (450 mg total) by mouth daily. 270 tablet 1  . clonazePAM (KLONOPIN) 1 MG tablet Take 1 tablet (1 mg total) by mouth 3 (three) times daily as needed (morning, evening, bedtime). 1 tid 90 tablet 3  . cloNIDine (CATAPRES - DOSED IN MG/24 HR) 0.3 mg/24hr patch Place 1 patch (0.3 mg total) onto the skin every 7 (seven) days. 4 patch 11  . cyclobenzaprine (FLEXERIL) 5 MG tablet Take 1 tablet (5 mg total) by mouth daily as needed for muscle spasms (headache). 14 tablet 0  . dexlansoprazole (DEXILANT) 60  MG capsule Take 1 capsule (60 mg total) by mouth daily. For reflux 30 capsule   . diclofenac sodium (VOLTAREN) 1 % GEL Apply 2 g topically 4 (four) times daily. 3 Tube 1  . Insulin Glargine (LANTUS SOLOSTAR) 100 UNIT/ML Solostar Pen Inject 51 Units into the skin at bedtime. 45 mL 4  . Insulin Pen Needle (BD PEN NEEDLE NANO U/F) 32G X 4 MM MISC Use to inject insulin into the skin 100 each 6  . isosorbide mononitrate (IMDUR) 60 MG 24 hr tablet Take 1 tablet (60 mg total) by mouth every morning. 90 tablet 4  . losartan (COZAAR) 100 MG tablet Take 1 tablet (100 mg total) by mouth daily. 90 tablet 4  . metoprolol succinate (TOPROL-XL) 25 MG 24 hr tablet Take 1 tablet (25 mg total) by mouth every morning. 90 tablet 4  . ondansetron (ZOFRAN) 4 MG tablet Take 4 mg by mouth every 8 (eight) hours as needed for  nausea.     . rosuvastatin (CRESTOR) 10 MG tablet Take 1 tablet (10 mg total) by mouth at bedtime. 30 tablet 11  . sertraline (ZOLOFT) 100 MG tablet Take 1.5 tablets (150 mg total) by mouth at bedtime. 1 and a half qam 135 tablet 1  . simethicone (GAS-X) 80 MG chewable tablet Chew 1 tablet (80 mg total) by mouth 4 (four) times daily as needed for flatulence. 100 tablet 2  . sitaGLIPtin (JANUVIA) 50 MG tablet Take 1 tablet (50 mg total) by mouth daily. 90 tablet 4  . traZODone (DESYREL) 50 MG tablet Take 1 tablet (50 mg total) by mouth at bedtime. 90 tablet 1   No current facility-administered medications for this visit.   Family History  Problem Relation Age of Onset  . Diabetes Mother   . Hypertension Mother   . Hyperlipidemia Mother   . Arthritis Mother   . Depression Mother   . Heart disease Father   . Diabetes Brother   . Heart disease Brother   . Heart disease Paternal Grandmother   . Diabetes Brother    Social History   Social History  . Marital Status: Married    Spouse Name: N/A  . Number of Children: N/A  . Years of Education: N/A   Social History Main Topics  . Smoking status: Former Smoker    Types: Cigarettes    Quit date: 10/25/2002  . Smokeless tobacco: Never Used  . Alcohol Use: No  . Drug Use: No  . Sexual Activity: Not Currently   Other Topics Concern  . None   Social History Narrative   Review of Systems: Review of Systems  Respiratory: Negative for shortness of breath.   Cardiovascular: Negative for chest pain.  Gastrointestinal: Negative for nausea and vomiting.  Neurological: Negative for weakness.    Objective:  Physical Exam: Filed Vitals:   11/26/15 0828  BP: 163/57  Pulse: 65  Temp: 97.8 F (36.6 C)  TempSrc: Oral  Height: 5\' 5"  (1.651 m)  Weight: 190 lb 4.8 oz (86.32 kg)  SpO2: 100%   Physical Exam  Constitutional: She appears well-developed and well-nourished. No distress.  HENT:  Head: Normocephalic.  Right Ear: External  ear normal.  Left Ear: External ear normal.  Eyes: Conjunctivae and EOM are normal. Right eye exhibits no discharge. Left eye exhibits no discharge. No scleral icterus.  Cardiovascular: Normal rate, regular rhythm and normal heart sounds.  Exam reveals no gallop and no friction rub.   No murmur heard. Pulmonary/Chest: No respiratory  distress. She has no wheezes. She has no rales.  Abdominal: Soft. Bowel sounds are normal. She exhibits no distension and no mass. There is no tenderness. There is no rebound and no guarding.  Neurological: She is alert. No cranial nerve deficit. Coordination (unable ambulate w/ heel to toe, able to normally ambulate, neg rombergs sign) abnormal.  Able to do rapid finger pinching b/l and nose to finger w/ eyes closes w/ left hand, difficulty touching nose with rt finger w/ eyes closes.   Skin: Skin is warm and dry.    Assessment & Plan:   Please see problem based assessment and plan.

## 2015-11-26 NOTE — Progress Notes (Signed)
Medicine attending: I personally interviewed and briefly examined this patient on the day of the patient visit and reviewed pertinent clinical ,laboratory, and radiographic data  with resident physician Dr.[name] Danella Penton and we discussed a management plan. This lady has developed the indolent onset of ataxia over the last few weeks. No nystagmus. Poor finger to nose coordination. Romberg negative but unable to tandem walk. She has risk factors for stroke. Already on ASA. We will get an MRI of her brain to rule out vertebrobasilar insufficient vs stroke. Short trial of meclizine to Rx symptoms. Advised not to drive.

## 2015-11-26 NOTE — Assessment & Plan Note (Addendum)
As per HPI pt has had unsteadiness for a few weeks that has gotten worse x 2 weeks. She denies the room spinning or dizziness. She denies any new medications other than flexeril that was prescribed on 1/18 which she only takes at night. She is on several centrally acting meds: zoloft, wellbutrin, trazodone, and clonopin. Most likley pt is having side effects of medications with the addition of flexeril. She is unable to walk heel to toe on exam today but otherwise neuro exam was unremarkable. Glucose 188 today, unlikely hypoglycemia causing unsteadiness. Possible that she had a cerebellar stroke.   - ordered MRI of head to look for stroke - rx for meclizine 32 mg TID prn - instructed to take asa daily which she has been - stopped flexeril - instructed not to drive until unsteadiness resolves - f/u in 2 weeks to ensure her sx are not worsening, can check a BMET for hyponatremia, TSH, and vitamin B12 if sx are not improved and MRI is negative.

## 2015-11-27 ENCOUNTER — Telehealth: Payer: Self-pay | Admitting: *Deleted

## 2015-11-27 NOTE — Telephone Encounter (Signed)
SPOKE WITH MS Marguarite AND GAVE HER X RAY APPOINTMENT. TOLD HER IF SHE NEED TO CHANGE HER APPOINTMENT SHE IS TO CALL [phone].

## 2015-11-28 ENCOUNTER — Other Ambulatory Visit: Payer: Self-pay | Admitting: Internal Medicine

## 2015-12-01 DIAGNOSIS — R1033 Periumbilical pain: Secondary | ICD-10-CM | POA: Diagnosis not present

## 2015-12-01 DIAGNOSIS — K5900 Constipation, unspecified: Secondary | ICD-10-CM | POA: Diagnosis not present

## 2015-12-01 DIAGNOSIS — K2960 Other gastritis without bleeding: Secondary | ICD-10-CM | POA: Diagnosis not present

## 2015-12-01 DIAGNOSIS — R110 Nausea: Secondary | ICD-10-CM | POA: Diagnosis not present

## 2015-12-03 ENCOUNTER — Ambulatory Visit (INDEPENDENT_AMBULATORY_CARE_PROVIDER_SITE_OTHER): Payer: 59 | Admitting: Psychiatry

## 2015-12-03 VITALS — BP 130/60 | HR 64 | Resp 12 | Ht 65.0 in | Wt 193.2 lb

## 2015-12-03 DIAGNOSIS — F329 Major depressive disorder, single episode, unspecified: Secondary | ICD-10-CM

## 2015-12-03 DIAGNOSIS — F330 Major depressive disorder, recurrent, mild: Secondary | ICD-10-CM

## 2015-12-03 DIAGNOSIS — F32A Depression, unspecified: Secondary | ICD-10-CM

## 2015-12-03 DIAGNOSIS — F331 Major depressive disorder, recurrent, moderate: Secondary | ICD-10-CM

## 2015-12-03 MED ORDER — BUPROPION HCL ER (XL) 150 MG PO TB24: 450.0000 mg | ORAL_TABLET | Freq: Every day | ORAL | Status: DC

## 2015-12-03 MED ORDER — CLONAZEPAM 1 MG PO TABS: 1.0000 mg | ORAL_TABLET | Freq: Three times a day (TID) | ORAL | Status: DC | PRN

## 2015-12-03 MED ORDER — SERTRALINE HCL 100 MG PO TABS
150.0000 mg | ORAL_TABLET | Freq: Every day | ORAL | Status: DC
Start: 2015-12-03 — End: 2016-03-04

## 2015-12-03 NOTE — Progress Notes (Signed)
[facility] MD Progress Note  12/03/2015 1:57 PM [name]  MRN:  [id number] Subjective:   Better  Happy Principal Problem: major depression , recurrent , mild Diagnosis:   Major depression, recurrent, mild   today the patient is doing somewhat better. She continues to grieve the death of her daughter who died complications of diabetes. The patient continues in therapy in the setting with [facility]. She still is about her daughter every day but it's less intense. The patient is sleeping and eating well. Her trazodone at night helps a great deal. The patient continues to gain weight as her appetite is somewhat better than normal. She no longer cries. The patient is able to get back to something she wasn't doing before. She now started back watching movies on TV. The patient has gone back to church. The patient is going back to the [facility] where she likes to walk. These are all her normal life activities and are beginning to return. The patient doesn't clock and she used to do the bills but she believes that to her husband. Patient takes care of her own medicines. The patient is having some neurological problems. She is not driving because she seems to be leaning to the right and has some visual defects on the right side of her visual fields. She's being evaluated by her primary care doctor and she is an MRI of her head scheduled for the next week. It is noted the patient is been on the Klonopin I prescribed for the last 2-3 months. She is not oversedated and has not had any falls. Overall she says her medicines of been very helpful. Her mood continues to improve. This patient does not drink any alcohol. She is no evidence of psychosis. Patient Active Problem List   Diagnosis Date Noted  . Unsteadiness [R26.81] 11/26/2015  . Secondary hyperparathyroidism of renal origin (HCC) [N25.81] 08/08/2015  . CKD (chronic kidney disease) stage 3, GFR 30-59 ml/min [N18.3] 08/05/2015  . Preventative health care [Z00.00] 06/18/2015   . Lateral epicondylitis of right elbow [M77.11] 04/17/2015  . Degenerative lumbar disc [M51.36] 12/30/2014  . Cough [R05]   . Degenerative joint disease involving multiple joints on both sides of body (right foot pain) [M15.9] 08/14/2014  . Chronic low back pain [M54.5, G89.29] 04/15/2014  . Hot flashes [N95.1] 01/02/2014  . Diastolic CHF (HCC) [I50.30] 07/11/2013  . Fibromyalgia syndrome [M79.7] 06/18/2013  . Abdominal pain, epigastric [R10.13] 03/29/2013  . Myalgia [M79.1] 12/21/2012  . Chronic chest pain [R07.9, G89.29] 11/21/2012  . Carpal tunnel syndrome, bilateral [G56.03] 07/25/2012  . Tension headache, chronic [G44.229] 05/24/2012  . Iron deficiency anemia [D50.9] 09/22/2011  . Depression [F32.9] 07/14/2011  . Anxiety [F41.9] 06/02/2011  . Lumbar spondylosis with myelopathy [M47.16] 11/20/2010  . Coronary atherosclerosis [I25.10] 03/10/2010  . Dyslipidemia [E78.5] 12/11/2008  . Vitamin D deficiency [E55.9] 11/26/2008  . Controlled type 2 diabetes mellitus with insulin therapy (HCC) [E11.9, Z79.4] 05/25/2007  . PERIPHERAL NEUROPATHY [G60.9] 05/25/2007  . Essential hypertension [I10] 05/25/2007  . GERD [K21.9] 05/25/2007  . SYMPTOM, APNEA, SLEEP NOS [G47.30] 05/25/2007   Total Time spent with patient: 30 minutes  Past Psychiatric History:   Past Medical History:  Past Medical History  Diagnosis Date  . Hypertension   . Peripheral neuropathy (HCC)   . Cervicalgia   . Low back pain syndrome   . Hyperlipidemia   . Vitamin D deficiency   . Vitamin B12 deficiency   . Iliotibial band syndrome   . Anemia   .  Borderline personality disorder   . Nonorganic psychosis   . GERD (gastroesophageal reflux disease)   . Diabetes mellitus   . Depression   . Diabetic gastroparesis (HCC)   . CAD (coronary artery disease)     Catherization 11/18/09>nonobstructive CAD , normal LV function, EF 65%  . Health maintenance examination     Colonoscopy 2009> normal; Diabetic eye exam  12/2008. No diabetic retinopathy; Mammogram 11/10> No evidence of malignancy, DXA 03/14/13 : normal  . Headache(784.0)   . Arthritis   . SBO (small bowel obstruction) (HCC) 01/09/2013  . Diastolic CHF (HCC)     Grade I, on Echo 06/2013, EF 55-60%  . Sleep apnea     does not wear CPAP   . Diabetes mellitus, type II (HCC)   . Wears glasses   . Wears dentures     upper  . Gout   . Right carpal tunnel syndrome 09/13/2014    Past Surgical History  Procedure Laterality Date  . Abdominal hysterectomy    . Hand surgery  1992    rt  . Bowel resection N/A 01/10/2013    Procedure: SMALL BOWEL RESECTION;  Surgeon: Lodema Pilot, DO;  Location: [facility] OR;  Service: General;  Laterality: N/A;  . Lysis of adhesion N/A 01/10/2013    Procedure: LYSIS OF ADHESION;  Surgeon: Lodema Pilot, DO;  Location: [facility] OR;  Service: General;  Laterality: N/A;  . Laparotomy N/A 01/10/2013    Procedure: EXPLORATORY LAPAROTOMY;  Surgeon: Lodema Pilot, DO;  Location: [facility] OR;  Service: General;  Laterality: N/A;  . Incisional hernia repair N/A 03/01/2014    Procedure: LAPAROSCOPIC INCISIONAL HERNIA;  Surgeon: Axel Filler, MD;  Location: [facility];  Service: General;  Laterality: N/A;  . Insertion of mesh N/A 03/01/2014    Procedure: INSERTION OF MESH;  Surgeon: Axel Filler, MD;  Location: [facility];  Service: General;  Laterality: N/A;  . Hernia repair  03/01/14    Lap incisional hernia repair w/mesh  . Appendectomy    . Carpal tunnel release Right 09/13/2014    Procedure: RIGHT WRIST CARPAL TUNNEL RELEASE;  Surgeon: Eulas Post, MD;  Location: [facility];  Service: Orthopedics;  Laterality: Right;  . Steriod injection Left 09/13/2014    Procedure: STEROID INJECTION LEFT HAND;  Surgeon: Eulas Post, MD;  Location: [facility];  Service: Orthopedics;  Laterality: Left;  . Carpal tunnel release Left 02/28/2015    Procedure: LEFT CARPAL TUNNEL RELEASE;  Surgeon: Teryl Lucy, MD;  Location: [facility];  Service: Orthopedics;  Laterality: Left;   Family History:  Family History  Problem Relation Age of Onset  . Diabetes Mother   . Hypertension Mother   . Hyperlipidemia Mother   . Arthritis Mother   . Depression Mother   . Heart disease Father   . Diabetes Brother   . Heart disease Brother   . Heart disease Paternal Grandmother   . Diabetes Brother    Family Psychiatric  History:  Social History:  History  Alcohol Use No     History  Drug Use No    Social History   Social History  . Marital Status: Married    Spouse Name: N/A  . Number of Children: N/A  . Years of Education: N/A   Social History Main Topics  . Smoking status: Former Smoker    Types: Cigarettes    Quit date: 10/25/2002  . Smokeless tobacco: Never Used  . Alcohol Use: No  .  Drug Use: No  . Sexual Activity: Not Currently   Other Topics Concern  . Not on file   Social History Narrative   Additional Social History:                         Sleep: Good  Appetite:  Good  Current Medications: Current Outpatient Prescriptions  Medication Sig Dispense Refill  . ACCU-CHEK AVIVA PLUS test strip TEST UPTO 4 TIMES DAILY 125 each 5  . ACCU-CHEK FASTCLIX LANCETS MISC TEST five times a day 102 each 11  . albuterol (PROVENTIL HFA;VENTOLIN HFA) 108 (90 BASE) MCG/ACT inhaler Inhale 2 puffs into the lungs every 6 (six) hours as needed for wheezing or shortness of breath. 1 Inhaler 2  . aspirin 81 MG chewable tablet Chew 81 mg by mouth daily.    Marland Kitchen BISACODYL PO Take 100 mg by mouth daily as needed.    Marland Kitchen buPROPion (WELLBUTRIN XL) 150 MG 24 hr tablet Take 3 tablets (450 mg total) by mouth daily. 270 tablet 1  . clonazePAM (KLONOPIN) 1 MG tablet Take 1 tablet (1 mg total) by mouth 3 (three) times daily as needed (morning, evening, bedtime). 1 tid 90 tablet 3  . cloNIDine (CATAPRES - DOSED IN MG/24 HR) 0.3 mg/24hr patch Place 1 patch (0.3 mg total) onto the skin every 7 (seven) days.  4 patch 11  . dexlansoprazole (DEXILANT) 60 MG capsule Take 1 capsule (60 mg total) by mouth daily. For reflux 30 capsule   . diclofenac sodium (VOLTAREN) 1 % GEL Apply 2 g topically 4 (four) times daily. 3 Tube 1  . Insulin Glargine (LANTUS SOLOSTAR) 100 UNIT/ML Solostar Pen Inject 51 Units into the skin at bedtime. 45 mL 4  . Insulin Pen Needle (BD PEN NEEDLE NANO U/F) 32G X 4 MM MISC Use to inject insulin into the skin 100 each 6  . isosorbide mononitrate (IMDUR) 60 MG 24 hr tablet Take 1 tablet (60 mg total) by mouth every morning. 90 tablet 4  . losartan (COZAAR) 100 MG tablet Take 1 tablet (100 mg total) by mouth daily. 90 tablet 4  . meclizine (ANTIVERT) 32 MG tablet Take 1 tablet (32 mg total) by mouth 3 (three) times daily as needed for dizziness. 30 tablet 0  . metoprolol succinate (TOPROL-XL) 25 MG 24 hr tablet Take 1 tablet (25 mg total) by mouth every morning. 90 tablet 4  . ondansetron (ZOFRAN) 4 MG tablet Take 4 mg by mouth every 8 (eight) hours as needed for nausea.     . rosuvastatin (CRESTOR) 10 MG tablet Take 1 tablet (10 mg total) by mouth at bedtime. 30 tablet 11  . sertraline (ZOLOFT) 100 MG tablet Take 1.5 tablets (150 mg total) by mouth at bedtime. 1 and a half qam 135 tablet 1  . simethicone (GAS-X) 80 MG chewable tablet Chew 1 tablet (80 mg total) by mouth 4 (four) times daily as needed for flatulence. 100 tablet 2  . sitaGLIPtin (JANUVIA) 50 MG tablet Take 1 tablet (50 mg total) by mouth daily. 90 tablet 4  . traZODone (DESYREL) 50 MG tablet Take 1 tablet (50 mg total) by mouth at bedtime. 90 tablet 1   No current facility-administered medications for this visit.    Lab Results: No results found. However, due to the size of the patient record, not all encounters were searched. Please check Results Review for a complete set of results.  Physical Findings: AIMS:  , ,  ,  ,  CIWA:    COWS:     Musculoskeletal: Strength & Muscle Tone: within normal limits Gait &  Station: normal Patient leans: N/A  Psychiatric Specialty Exam: ROS  Blood pressure 130/60, pulse 64, resp. rate 12, height 5\' 5"  (1.651 m), weight 193 lb 3.2 oz (87.635 kg).Body mass index is 32.15 kg/(m^2).  General Appearance: Casual  Eye Contact::  Good  Speech:  Clear and Coherent  Volume:  Normal  Mood:  Euthymic  Affect:  Congruent  Thought Process:  Coherent  Orientation:  Full (Time, Place, and Person)  Thought Content:  WDL  Suicidal Thoughts:  No  Homicidal Thoughts:  No  Memory:  NA  Judgement:  Good  Insight:  Good  Psychomotor Activity:  Normal  Concentration:  Good  Recall:  Good  Fund of Knowledge:Good  Language: Good  Akathisia:  No  Handed:  Right  AIMS (if indicated):     Assets:  Desire for Improvement  ADL's:  Intact  Cognition: WNL  Sleep:      Treatment Plan Summary:  at this time the patient is showing continuous improvement. She is not back to her baseline but she is going back to previous activities like [employer] activities church watching movies. Overall the patient is improving. Slight increase of Zoloft may help but I suspect she is doing well with her therapist. The patient is active and she continues to advance. Her #1 problem is depression and she takes the maximum dose of Wellbutrin 450 mg. Her last visit we increased her Zoloft 250 mg which she thinks is been helpful. The patient will continue taking Klonopin 1 mg 3 times a day but on her next visit in 3 months we will begin taper her off her Klonopin. The patient continue taking trazodone as ordered. Patient will be seen by her primary care doctor and will have an MRI of her head. She will continue in one-to-one therapy here. Overall the patient is better she is crying less while she still thinks about her daughter every day it is less distressing. The patient enjoys seeing her great-grandchild who is 8 years old.  Lucas Mallow, MD 12/03/2015, 1:57 PM

## 2015-12-11 ENCOUNTER — Ambulatory Visit (HOSPITAL_COMMUNITY)
Admission: RE | Admit: 2015-12-11 | Discharge: 2015-12-11 | Disposition: A | Discharge status: Home / Self Care | Payer: Medicare Other | Source: Ambulatory Visit | Attending: Oncology | Admitting: Oncology

## 2015-12-11 ENCOUNTER — Other Ambulatory Visit: Payer: Self-pay | Admitting: Internal Medicine

## 2015-12-11 ENCOUNTER — Ambulatory Visit (HOSPITAL_COMMUNITY): Payer: Self-pay | Admitting: Clinical

## 2015-12-11 DIAGNOSIS — R2681 Unsteadiness on feet: Secondary | ICD-10-CM | POA: Diagnosis not present

## 2015-12-11 DIAGNOSIS — R410 Disorientation, unspecified: Secondary | ICD-10-CM | POA: Diagnosis not present

## 2015-12-11 LAB — CREATININE, SERUM
CREATININE: 1.91 mg/dL — AB (ref 0.44–1.00)
GFR calc non Af Amer: 26 mL/min — ABNORMAL LOW (ref >60)
GFR, EST AFRICAN AMERICAN: 31 mL/min — AB (ref >60)

## 2015-12-12 ENCOUNTER — Telehealth: Payer: Self-pay | Admitting: Internal Medicine

## 2015-12-12 NOTE — Telephone Encounter (Signed)
I spoke to [name] regarding her MRI results which are normal. Given her continuing symptoms I advised her to follow up at [facility] next week. She will likely need referral to neurology for further w/u.

## 2015-12-12 NOTE — Telephone Encounter (Signed)
Pt requesting MRI result. Please call pt back.  °

## 2015-12-15 NOTE — Telephone Encounter (Signed)
Per dr Heide Spark pt needs appt next week

## 2015-12-22 ENCOUNTER — Encounter (HOSPITAL_COMMUNITY): Payer: Self-pay | Admitting: Clinical

## 2015-12-22 ENCOUNTER — Ambulatory Visit (INDEPENDENT_AMBULATORY_CARE_PROVIDER_SITE_OTHER): Payer: 59 | Admitting: Clinical

## 2015-12-22 DIAGNOSIS — F411 Generalized anxiety disorder: Secondary | ICD-10-CM | POA: Diagnosis not present

## 2015-12-22 DIAGNOSIS — F4321 Adjustment disorder with depressed mood: Secondary | ICD-10-CM | POA: Diagnosis not present

## 2015-12-22 DIAGNOSIS — F331 Major depressive disorder, recurrent, moderate: Secondary | ICD-10-CM | POA: Diagnosis not present

## 2015-12-22 NOTE — Progress Notes (Addendum)
   THERAPIST PROGRESS NOTE  Session Time: 11:03 - 12:00  Participation Level: Active  Behavioral Response: CasualAlertDepressed  Type of Therapy: Individual Therapy  Treatment Goals addressed: Improve Psychiatric Symptoms,   Interventions: Motivational Interviewing, CBT, Grounding & Mindfulness Techniques  Summary: [name] is a 65 y.o. female who presents with Major Depressive Disorder, recurrent moderate, Generalized Anxiety Disorder, Grief  Suicidal/Homicidal: No -without intent/plan  Therapist Response:  [name] met with clinician for an individual session. [name] shared about her psychiatric symptoms, and her current life events. Also and clinician worked together to update her assessment. [name] shared that she feels like she has been making progress things are still very difficult for her. This past year [name] lost her daughter which has increased her depression, she is also experiencing physical issues [name] shared that she has been having additional medical issues. She shared that she fell unexpectedly 2x since last session) and relationship issues with her husband (though they are improving). She shared that her anger has decreased since her last assessment and that she experiences it more as irritation and frustration. [name] shared more a bout her childhood and her young adulthood.  [name] shared that she was ready for the next Distress tolerance(2)  packet. She agreed to complete it before next session.   Plan: Return again in 1-2 weeks.  Diagnosis:     Axis I: Major Depressive Disorder, recurrent moderate, Generalized Anxiety Disorder, Grief   Elton Heid A, LCSW 12/22/2015

## 2015-12-24 ENCOUNTER — Ambulatory Visit: Payer: Self-pay | Admitting: Internal Medicine

## 2015-12-25 ENCOUNTER — Encounter: Payer: Self-pay | Admitting: Internal Medicine

## 2015-12-25 ENCOUNTER — Ambulatory Visit (INDEPENDENT_AMBULATORY_CARE_PROVIDER_SITE_OTHER): Payer: Medicare Other | Admitting: Internal Medicine

## 2015-12-25 VITALS — BP 144/67 | HR 86 | Temp 98.3°F | Ht 65.0 in | Wt 193.2 lb

## 2015-12-25 DIAGNOSIS — Z9181 History of falling: Secondary | ICD-10-CM | POA: Diagnosis not present

## 2015-12-25 DIAGNOSIS — R296 Repeated falls: Secondary | ICD-10-CM | POA: Diagnosis not present

## 2015-12-25 DIAGNOSIS — E119 Type 2 diabetes mellitus without complications: Secondary | ICD-10-CM

## 2015-12-25 DIAGNOSIS — Z794 Long term (current) use of insulin: Secondary | ICD-10-CM

## 2015-12-25 DIAGNOSIS — R2681 Unsteadiness on feet: Secondary | ICD-10-CM

## 2015-12-25 LAB — GLUCOSE, CAPILLARY: GLUCOSE-CAPILLARY: 131 mg/dL — AB (ref 65–99)

## 2015-12-25 NOTE — Assessment & Plan Note (Addendum)
History: She presents with a one-month history of gait instability, but denies vertigo does not have clinical signs of ataxia on exam. She had a normal MRI last week. Her daughter died in 07-11-2023, and since then she has been incredibly depressed. The last month, she stopped spending time with her friends, and feels tired all the time. She is taking all of her medications as prescribed, notably clonazepam, meclizine, ondansetron, sertraline, bupropion, and trazodone.  Assessment: I think her instability and gait is more likely from polypharmacy than an organic endocrine or neurologic cause.  Plan: I recommended she reduce her clonazepam to twice daily; it appears her psychiatrist was going to down titrate this medication anyway. I've stopped the meclizine as well. I've also referred her to physical therapy to help with gait instability. Her DEXA scan was normal back in 2014 so I don't feel strongly about starting a bisphosphonate.

## 2015-12-25 NOTE — Progress Notes (Signed)
Patient ID: [name], female   DOB: 11-29-1950, 65 y.o.   MRN: [id number] [facility] Subjective:   Patient ID: [name] female   DOB: 01-Oct-1951 65 y.o.   MRN: [id number]  HPI: [name] is a 65 y.o. female with a relevant history of chronic lower back pain and depression presenting with an unstable gait.  She is not smoking and I have reviewed her medications with her.  Please see the assessment and plan for the status of the patient's chronic medical problems.  Review of Systems  Constitutional: Positive for malaise/fatigue. Negative for fever and chills.  Eyes: Negative for photophobia and pain.  Cardiovascular: Negative for leg swelling.  Musculoskeletal: Positive for back pain, falls and neck pain. Negative for joint pain.  Neurological: Positive for headaches. Negative for dizziness, sensory change, focal weakness and loss of consciousness.  Psychiatric/Behavioral: Positive for depression. The patient is nervous/anxious.    Objective:  Physical Exam: Filed Vitals:   12/25/15 1502  BP: 144/67  Pulse: 86  Temp: 98.3 F (36.8 C)  TempSrc: Oral  Height:  (1.651 m)  Weight: 193 lb 3.2 oz (87.635 kg)  SpO2: 97%   General: resting in chair comfortably, appropriately conversational Cardiac: regular rate and rhythm, no rubs, murmurs or gallops Pulm: breathing well, clear to auscultation bilaterally Ext: warm and well perfused, without pedal edema Lymph: no cervical or supraclavicular lymphadenopathy Skin: no rash, hair, or nail changes Neuro: alert and oriented X3, cranial nerves II-XII grossly intact, moving all extremities well, straight leg raise negative bilaterally, able to walk across the room without a problem, but appears unstead on heel-to-toe walking; however she is able to correct herself without falling, normal finger-to-nose, no nystagus  Assessment & Plan:  Case discussed with Dr. Oswaldo Done  At high risk for falls History:  She presents with a one-month history of gait instability, but denies vertigo does not have clinical signs of ataxia on exam. She had a normal MRI last week. Her daughter died in 30-Jun-2023, and since then she has been incredibly depressed. The last month, she stopped spending time with her friends, and feels tired all the time. She is taking all of her medications as prescribed, notably clonazepam, meclizine, ondansetron, sertraline, bupropion, and trazodone.  Assessment: I think her instability and gait is more likely from polypharmacy than an organic endocrine or neurologic cause.  Plan: I recommended she reduce her clonazepam to twice daily; it appears her psychiatrist was going to down titrate this medication anyway. I've also referred her to physical therapy to help with gait instability. Her DEXA scan was normal back in 2014 so I don't feel strongly about starting a bisphosphonate.   Medications Ordered No orders of the defined types were placed in this encounter.   Other Orders Orders Placed This Encounter  Procedures  . Glucose, capillary  . Ambulatory referral to Physical Therapy    Referral Priority:  Routine    Referral Type:  Physical Medicine    Referral Reason:  Specialty Services Required    Requested Specialty:  Physical Therapy    Number of Visits Requested:  1   Follow Up: Return in about 4 weeks (around 01/22/2016).

## 2015-12-25 NOTE — Patient Instructions (Signed)
Ms. Janco,  It was very nice to meet you today!  I'm sorry to hear you're still feeling unstable while you walk. I think physical therapy will do wonders in strengthening your back and hips, and they can also get you a cane if you need it. I also recommend taking 2 Klonopin per day, but 3 if you need it. This can make you feel unsteady so I think taking less would help you get around better.  Again, it was great to meet you. I'd like to see you back in 1 month, but earlier if something comes up.  Take care, Dr. Earnest Conroy

## 2015-12-26 NOTE — Addendum Note (Signed)
Addended by: Erlinda HongVINCENT, Leafy Motsinger T on: 12/26/2015 10:16 AM   Modules accepted: Level of Service

## 2015-12-26 NOTE — Progress Notes (Signed)
Internal Medicine Clinic Attending  Case discussed with Dr. Flores at the time of the visit.  We reviewed the resident's history and exam and pertinent patient test results.  I agree with the assessment, diagnosis, and plan of care documented in the resident's note. 

## 2016-01-01 ENCOUNTER — Ambulatory Visit
Payer: Medicare Other | Attending: Student in an Organized Health Care Education/Training Program | Admitting: Rehabilitation

## 2016-01-01 DIAGNOSIS — Z789 Other specified health status: Secondary | ICD-10-CM | POA: Diagnosis not present

## 2016-01-01 DIAGNOSIS — R2681 Unsteadiness on feet: Secondary | ICD-10-CM | POA: Insufficient documentation

## 2016-01-01 DIAGNOSIS — M6281 Muscle weakness (generalized): Secondary | ICD-10-CM | POA: Insufficient documentation

## 2016-01-01 DIAGNOSIS — M545 Low back pain: Secondary | ICD-10-CM | POA: Diagnosis not present

## 2016-01-01 NOTE — Therapy (Signed)
[facility] [address] [place], [place], [address] Phone: [phone]   Fax:  [phone]  Physical Therapy Evaluation  Patient Details  Name: [name] MRN: [id number] Date of Birth: Feb 28, 1951 Referring Provider: Tyson Alias, MD  Encounter Date: 01/01/2016      PT End of Session - 01/01/16 1057    Visit Number 1   Number of Visits 9   Date for PT Re-Evaluation 01/31/16   Authorization Type [employer] MCR G codes every 10th visit   PT Start Time 1008   PT Stop Time 1053   PT Time Calculation (min) 45 min   Activity Tolerance Patient tolerated treatment well   Behavior During Therapy [facility] for tasks assessed/performed      Past Medical History  Diagnosis Date  . Hypertension   . Peripheral neuropathy (HCC)   . Cervicalgia   . Low back pain syndrome   . Hyperlipidemia   . Vitamin D deficiency   . Vitamin B12 deficiency   . Iliotibial band syndrome   . Anemia   . Borderline personality disorder   . Nonorganic psychosis   . GERD (gastroesophageal reflux disease)   . Diabetes mellitus   . Depression   . Diabetic gastroparesis (HCC)   . CAD (coronary artery disease)     Catherization 11/18/09>nonobstructive CAD , normal LV function, EF 65%  . Health maintenance examination     Colonoscopy 2009> normal; Diabetic eye exam 12/2008. No diabetic retinopathy; Mammogram 11/10> No evidence of malignancy, DXA 03/14/13 : normal  . Headache(784.0)   . Arthritis   . SBO (small bowel obstruction) (HCC) 01/09/2013  . Diastolic CHF (HCC)     Grade I, on Echo 06/2013, EF 55-60%  . Sleep apnea     does not wear CPAP   . Diabetes mellitus, type II (HCC)   . Wears glasses   . Wears dentures     upper  . Gout   . Right carpal tunnel syndrome 09/13/2014    Past Surgical History  Procedure Laterality Date  . Abdominal hysterectomy    . Hand surgery  1992    rt  . Bowel resection N/A 01/10/2013    Procedure: SMALL  BOWEL RESECTION;  Surgeon: Lodema Pilot, DO;  Location: [facility] OR;  Service: General;  Laterality: N/A;  . Lysis of adhesion N/A 01/10/2013    Procedure: LYSIS OF ADHESION;  Surgeon: Lodema Pilot, DO;  Location: [facility] OR;  Service: General;  Laterality: N/A;  . Laparotomy N/A 01/10/2013    Procedure: EXPLORATORY LAPAROTOMY;  Surgeon: Lodema Pilot, DO;  Location: [facility] OR;  Service: General;  Laterality: N/A;  . Incisional hernia repair N/A 03/01/2014    Procedure: LAPAROSCOPIC INCISIONAL HERNIA;  Surgeon: Axel Filler, MD;  Location: [facility];  Service: General;  Laterality: N/A;  . Insertion of mesh N/A 03/01/2014    Procedure: INSERTION OF MESH;  Surgeon: Axel Filler, MD;  Location: [facility];  Service: General;  Laterality: N/A;  . Hernia repair  03/01/14    Lap incisional hernia repair w/mesh  . Appendectomy    . Carpal tunnel release Right 09/13/2014    Procedure: RIGHT WRIST CARPAL TUNNEL RELEASE;  Surgeon: Eulas Post, MD;  Location: [facility];  Service: Orthopedics;  Laterality: Right;  . Steriod injection Left 09/13/2014    Procedure: STEROID INJECTION LEFT HAND;  Surgeon: Eulas Post, MD;  Location: [facility];  Service: Orthopedics;  Laterality: Left;  . Carpal  tunnel release Left 02/28/2015    Procedure: LEFT CARPAL TUNNEL RELEASE;  Surgeon: Teryl Lucy, MD;  Location: [facility];  Service: Orthopedics;  Laterality: Left;    There were no vitals filed for this visit.  Visit Diagnosis:  Unsteadiness - Plan: PT plan of care cert/re-cert  Muscle weakness (generalized) - Plan: PT plan of care cert/re-cert  Poor tolerance for activity - Plan: PT plan of care cert/re-cert  Right low back pain, with sciatica presence unspecified      Subjective Assessment - 01/01/16 1017    Subjective "I went to the doctor because I have been real unsteady on my feet.  Sometimes I feel like I'm going backwards.  I keep falling and the last time I fell, I  think I hurt my back."     Limitations Walking;Sitting   Currently in Pain? Yes   Pain Score 5    Pain Location Buttocks   Pain Orientation Right   Pain Descriptors / Indicators Aching   Pain Type Acute pain   Pain Radiating Towards going into R leg   Pain Onset 1 to 4 weeks ago   Pain Frequency Intermittent   Aggravating Factors  sitting a long time   Pain Relieving Factors laying down, resting, laying on other side            [facility] PT Assessment - 01/01/16 0001    Assessment   Medical Diagnosis Gait instability   Referring Provider Tyson Alias, MD   Onset Date/Surgical Date --  This began 6 months ago per pt report   Precautions   Precautions Fall   Restrictions   Weight Bearing Restrictions No   Balance Screen   Has the patient fallen in the past 6 months Yes   How many times? 3   Has the patient had a decrease in activity level because of a fear of falling?  Yes   Is the patient reluctant to leave their home because of a fear of falling?  Yes   Home Environment   Living Environment Private residence   Living Arrangements Spouse/significant other   Available Help at Discharge Family;Available 24 hours/day   Type of Home Apartment   Home Access Stairs to enter   Entrance Stairs-Number of Steps 5  none in the back (where she usually enters)   Entrance Stairs-Rails Right   Home Layout One level   Home Equipment Walker - 2 wheels   Prior Function   Level of Independence Independent  is limited by pain   Vocation On disability   Leisure Likes to [employer], read, watches TV, used to attend the [employer]   Overall Cognitive Status Within Functional Limits for tasks assessed  note that she has "stages" she doesn't want to be social   Observation/Other Assessments   Focus on Therapeutic Outcomes (FOTO)  ABC 58.1 %   Sensation   Light Touch Appears Intact   Hot/Cold Appears Intact   Proprioception Appears Intact   Coordination   Gross Motor  Movements are Fluid and Coordinated Yes  in LEs   Fine Motor Movements are Fluid and Coordinated Yes  in LEs   Posture/Postural Control   Posture/Postural Control Postural limitations   Postural Limitations Weight shift left  avoids R hip   ROM / Strength   AROM / PROM / Strength Strength   Strength   Overall Strength Within functional limits for tasks performed  mild proximal weakness   Transfers  Transfers Sit to Stand;Stand to Sit   Sit to Stand 7: Independent   Stand to Sit 7: Independent   Ambulation/Gait   Ambulation/Gait Yes   Ambulation/Gait Assistance 6: Modified independent (Device/Increase time)   Ambulation Distance (Feet) 345 Feet   Assistive device None   Gait Pattern Step-through pattern   Ambulation Surface Level;Indoor   Gait velocity 3.02 ft/sec   Stairs Yes   Stairs Assistance 6: Modified independent (Device/Increase time);5: Supervision   Stairs Assistance Details (indicate cue type and reason) Provided cues for sequencing to decrease pain in RLE   Stair Management Technique Two rails;Alternating pattern;Step to pattern;Forwards   Number of Stairs 4   Height of Stairs 6   Functional Gait  Assessment   Gait assessed  Yes   Gait Level Surface Walks 20 ft in less than 7 sec but greater than 5.5 sec, uses assistive device, slower speed, mild gait deviations, or deviates 6-10 in outside of the 12 in walkway width.   Change in Gait Speed Able to change speed, demonstrates mild gait deviations, deviates 6-10 in outside of the 12 in walkway width, or no gait deviations, unable to achieve a major change in velocity, or uses a change in velocity, or uses an assistive device.   Gait with Horizontal Head Turns Performs head turns smoothly with slight change in gait velocity (eg, minor disruption to smooth gait path), deviates 6-10 in outside 12 in walkway width, or uses an assistive device.   Gait with Vertical Head Turns Performs task with slight change in gait velocity  (eg, minor disruption to smooth gait path), deviates 6 - 10 in outside 12 in walkway width or uses assistive device   Gait and Pivot Turn Pivot turns safely in greater than 3 sec and stops with no loss of balance, or pivot turns safely within 3 sec and stops with mild imbalance, requires small steps to catch balance.   Step Over Obstacle Is able to step over one shoe box (4.5 in total height) but must slow down and adjust steps to clear box safely. May require verbal cueing.   Gait with Narrow Base of Support Ambulates 4-7 steps.   Gait with Eyes Closed Walks 20 ft, slow speed, abnormal gait pattern, evidence for imbalance, deviates 10-15 in outside 12 in walkway width. Requires more than 9 sec to ambulate 20 ft.   Ambulating Backwards Walks 20 ft, uses assistive device, slower speed, mild gait deviations, deviates 6-10 in outside 12 in walkway width.   Steps Two feet to a stair, must use rail.   Total Score 16                           PT Education - 01/01/16 1056    Education provided Yes   Education Details Education on evaluation findings, POC, goals, piriformis stretch   Person(s) Educated Patient   Methods Explanation;Demonstration;Handout   Comprehension Verbalized understanding;Returned demonstration             PT Long Term Goals - 01/01/16 1327    PT LONG TERM GOAL #1   Title Pt will be independent with HEP in order to decrease fall risk and improve functional mobility.  (Target Date: 01/29/16)   PT LONG TERM GOAL #2   Title Pt will increase FGA to >19/30 in order to indicate decreased fall risk.    PT LONG TERM GOAL #3   Title Pt will verbalize no more than 2  point increase in pain with all functional mobility to indicate pain no longer limiting factor in mobility.    PT LONG TERM GOAL #4   Title Pt will verbalize return to community fitness program in order to continue progress made in therapy.     PT LONG TERM GOAL #5   Title Pt will ambulate over all  outdoor surfaces at mod I level >1000' in order to indicate safe community negotiation.                Plan - 01/01/16 1057    Clinical Impression Statement Pt presents with increasing unsteadiness and decreased balance with reports of 3 falls in last 6 months.  She reports that she tends to fall to the R or backwards and note that last fall resulted in R hip/glute pain.  Note history of DM and peripheral neuropathy with depression (esp since daughter's death last year) which may impact progress in therapy.   Upon PT evaluation, note that pts gait speed is 3.02 ft/sec, indicative of community ambulator, but still below normal range, FGA of 16/30, indicative of high fall risk and increasing pain in RLE with R side bending and extension.   Based on findings, note pt to be of evolving presentation and moderate complexity as it relates to PT POC.  Pt will benefit from skilled OP neuro PT to address deficits.     Pt will benefit from skilled therapeutic intervention in order to improve on the following deficits Abnormal gait;Decreased activity tolerance;Decreased balance;Decreased endurance;Decreased mobility;Decreased strength;Impaired perceived functional ability;Impaired sensation;Impaired flexibility;Improper body mechanics;Postural dysfunction;Pain   Rehab Potential Good   Clinical Impairments Affecting Rehab Potential history of depression   PT Frequency 2x / week   PT Duration 4 weeks   PT Treatment/Interventions ADLs/Self Care Home Management;Electrical Stimulation;DME Instruction;Gait training;Stair training;Functional mobility training;Therapeutic activities;Therapeutic exercise;Neuromuscular re-education;Patient/family education   PT Next Visit Plan est HEP for corner balance and proximal hip strength   Consulted and Agree with Plan of Care Patient          G-Codes - 01/01/16 1339    Functional Assessment Tool Used FGA: 16/30   Functional Limitation Mobility: Walking and moving  around   Mobility: Walking and Moving Around Current Status [phone]([id number]) At least 40 percent but less than 60 percent impaired, limited or restricted   Mobility: Walking and Moving Around Goal Status [phone]([id number]) At least 20 percent but less than 40 percent impaired, limited or restricted       Problem List Patient Active Problem List   Diagnosis Date Noted  . At high risk for falls 12/25/2015  . Secondary hyperparathyroidism of renal origin (HCC) 08/08/2015  . CKD (chronic kidney disease) stage 3, GFR 30-59 ml/min 08/05/2015  . Preventative health care 06/18/2015  . Lateral epicondylitis of right elbow 04/17/2015  . Degenerative lumbar disc 12/30/2014  . Cough   . Degenerative joint disease involving multiple joints on both sides of body (right foot pain) 08/14/2014  . Chronic low back pain 04/15/2014  . Hot flashes 01/02/2014  . Fibromyalgia syndrome 06/18/2013  . Abdominal pain, epigastric 03/29/2013  . Myalgia 12/21/2012  . Chronic chest pain 11/21/2012  . Carpal tunnel syndrome, bilateral 07/25/2012  . Tension headache, chronic 05/24/2012  . Iron deficiency anemia 09/22/2011  . Depression 07/14/2011  . Anxiety 06/02/2011  . Lumbar spondylosis with myelopathy 11/20/2010  . Coronary atherosclerosis 03/10/2010  . Dyslipidemia 12/11/2008  . Vitamin D deficiency 11/26/2008  . Controlled type 2 diabetes mellitus with insulin therapy (  HCC) 05/25/2007  . PERIPHERAL NEUROPATHY 05/25/2007  . Essential hypertension 05/25/2007  . GERD 05/25/2007  . SYMPTOM, APNEA, SLEEP NOS 05/25/2007    Harriet Butte, PT, MPT [facility] [address] [place], [place], [address] Phone: [phone]   Fax:  [phone] 01/01/2016, 1:43 PM  Name: [name] MRN: [id number] Date of Birth: 06/03/1951

## 2016-01-01 NOTE — Patient Instructions (Signed)
Piriformis Stretch, Sitting    Sit, one ankle on opposite knee, same-side hand on crossed knee. Push down on knee, keeping spine straight. Lean torso forward, with flat back, until tension is felt in hamstrings and gluteals of crossed-leg side (right). Hold _60__ seconds.  Repeat _3__ times per session. Do _2__ sessions per day.  Copyright  VHI. All rights reserved.

## 2016-01-05 ENCOUNTER — Ambulatory Visit (HOSPITAL_COMMUNITY): Payer: Self-pay | Admitting: Clinical

## 2016-01-05 ENCOUNTER — Ambulatory Visit: Payer: Medicare Other | Admitting: Physical Therapy

## 2016-01-08 ENCOUNTER — Ambulatory Visit: Payer: Medicare Other | Admitting: Physical Therapy

## 2016-01-12 ENCOUNTER — Ambulatory Visit: Payer: Medicare Other | Admitting: Physical Therapy

## 2016-01-14 ENCOUNTER — Telehealth: Payer: Self-pay | Admitting: Internal Medicine

## 2016-01-14 ENCOUNTER — Telehealth: Payer: Self-pay | Admitting: Cardiology

## 2016-01-14 NOTE — Telephone Encounter (Signed)
Agree with recommendation. Should follow with primary care first.  Lourene Hoston [place] MD, [facility]

## 2016-01-14 NOTE — Telephone Encounter (Signed)
Thanks. I will see her on the 4th. She should come into the clinic earlier if there are any acute concerns.   Gara Kronerruong, Electa Sterry, MD Internal Medicine Resident, PGY [facility] Pager: [phone]

## 2016-01-14 NOTE — Telephone Encounter (Signed)
Had called and lm for rtc, tried again and pt answered, she states she has noticed several times that she would feel heavierin her abd and legs, she also weighs daily "it is hooked up to [facility] and they look at it", recently she was up 5lbs, she states she took some lasix she had at home and by the next day she lost that 5 lbs and felt better. She states she has spoken to her insurance nurse and called dr Elvis Coiljordan's office too. She is advised to schedule an appt with dr Swazilandjordan, to keep her 4/3 appt w/ dr Danella Pentontruong, to bring her medicines with her, to make sure dr Danella Pentontruong has a copy of the daily weights and most importantly if she has any more fluctuations of weight and doesn't feel well to call clinic, dr Elvis Coiljordan's office or got to ED, especially for shortness of breath, chest pain, h/a, N&V, vision changes, weakness, dizziness, upper back pain, changes in ambulation or thought process, she verbalized understanding and agreed

## 2016-01-14 NOTE — Telephone Encounter (Signed)
Advice communicated to patient. 

## 2016-01-14 NOTE — Telephone Encounter (Signed)
[facility]( [employer] Case Management Heart Failure Program)  Calling for [name] , because she is having some swelling in upper legs . She is currently not on a fluid pill, but she took one . Please call   Thanks

## 2016-01-14 NOTE — Telephone Encounter (Signed)
Pt has questions about fluid retention and CHF.

## 2016-01-14 NOTE — Telephone Encounter (Signed)
Returned call to patient.  Pt notes swelling in upper legs, wt fluctuations betw 1-3 lbs over 24 hrs for several weeks. She does not note overall gain.  She had been taken off lasix by PCP - she could not recall when - looks like May 2016 on chart review. She's not clear on whether she's used PRN dosing since that time previous to today. Pt had lasix on-hand, took this AM, at previously prescribed dose. She contacted PCP and our office.  Pt has not been seen by Dr. SwazilandJordan since 07/2014.  Noted overdue for return OV.  Advised pt to await PCP recommendation since they made medication adjustments.  Nothing further advised currently by this RN, as she has already taken the lasix. Will route to Dr. SwazilandJordan for further advice.

## 2016-01-15 ENCOUNTER — Ambulatory Visit: Payer: Medicare Other | Admitting: Physical Therapy

## 2016-01-15 DIAGNOSIS — R2681 Unsteadiness on feet: Secondary | ICD-10-CM

## 2016-01-15 DIAGNOSIS — M6281 Muscle weakness (generalized): Secondary | ICD-10-CM | POA: Diagnosis not present

## 2016-01-15 DIAGNOSIS — Z789 Other specified health status: Secondary | ICD-10-CM

## 2016-01-15 DIAGNOSIS — M545 Low back pain: Secondary | ICD-10-CM | POA: Diagnosis not present

## 2016-01-15 NOTE — Patient Instructions (Addendum)
STAND IN A CORNER WITH A CHAIR IN FRONT OF YOU FOR SAFETY.  Feet Apart (Compliant Surface) Varied Arm Positions - Eyes Closed    Stand on compliant surface: _pillow or cushion___ with feet shoulder width apart and arms out. Close eyes and stand still. Hold_10-15___ seconds. Repeat __5__ times per session. Do __1-2__ sessions per day.  Copyright  VHI. All rights reserved.  Feet Together (Compliant Surface) Head Motion - Eyes Open    With eyes open, standing on compliant surface: _pillow or cushion___, feet together, move head slowly: up and down 10 times and side to side 10 times. Repeat __1-2__ times per session. Do __1-2__ sessions per day.  Copyright  VHI. All rights reserved.  Feet Together, Head Motion - Eyes Closed    With eyes closed and feet together, move head slowly, up and down 10 times and side to side 10 times. Repeat __1-2__ times per session. Do _1-2___ sessions per day.  Copyright  VHI. All rights reserved.    Bridging    Slowly raise buttocks from floor, keeping stomach tight. Hold 5 seconds. Repeat _10___ times per set. Do __1__ sets per session. Do __1-2__ sessions per day.  http://orth.exer.us/1097   Copyright  VHI. All rights reserved.    Clam Shell 45 Degrees    Lying with hips and knees bent 45, one pillow between knees and ankles. Lift knee. Be sure pelvis does not roll backward. Do not arch back. Do _10__ times, each leg, _1-2__ times per day.  http://ss.exer.us/75   Copyright  VHI. All rights reserved.

## 2016-01-15 NOTE — Therapy (Signed)
[facility] [address] [place], [place], [address] Phone: [phone]   Fax:  [phone]  Physical Therapy Treatment  Patient Details  Name: [name] MRN: [id number] Date of Birth: 1951/04/06 Referring Provider: Tyson Aliasuncan Thomas Vincent, MD  Encounter Date: 01/15/2016      PT End of Session - 01/15/16 1355    Visit Number 2   Number of Visits 9   Date for PT Re-Evaluation 01/31/16   Authorization Type UHC MCR G codes every 10th visit   PT Start Time 1315   PT Stop Time 1355   PT Time Calculation (min) 40 min   Activity Tolerance Patient tolerated treatment well   Behavior [facility] tasks assessed/performed      Past Medical History  Diagnosis Date  . Hypertension   . Peripheral neuropathy (HCC)   . Cervicalgia   . Low back pain syndrome   . Hyperlipidemia   . Vitamin D deficiency   . Vitamin B12 deficiency   . Iliotibial band syndrome   . Anemia   . Borderline personality disorder   . Nonorganic psychosis   . GERD (gastroesophageal reflux disease)   . Diabetes mellitus   . Depression   . Diabetic gastroparesis (HCC)   . CAD (coronary artery disease)     Catherization 11/18/09>nonobstructive CAD , normal LV function, EF 65%  . Health maintenance examination     Colonoscopy 2009> normal; Diabetic eye exam 12/2008. No diabetic retinopathy; Mammogram 11/10> No evidence of malignancy, DXA 03/14/13 : normal  . Headache(784.0)   . Arthritis   . SBO (small bowel obstruction) (HCC) 01/09/2013  . Diastolic CHF (HCC)     Grade I, on Echo 06/2013, EF 55-60%  . Sleep apnea     does not wear CPAP   . Diabetes mellitus, type II (HCC)   . Wears glasses   . Wears dentures     upper  . Gout   . Right carpal tunnel syndrome 09/13/2014    Past Surgical History  Procedure Laterality Date  . Abdominal hysterectomy    . Hand surgery  1992    rt  . Bowel resection N/A 01/10/2013    Procedure: SMALL  BOWEL RESECTION;  Surgeon: Lodema PilotBrian Layton, DO;  Location: [facility];  Service: General;  Laterality: N/A;  . Lysis of adhesion N/A 01/10/2013    Procedure: LYSIS OF ADHESION;  Surgeon: Lodema PilotBrian Layton, DO;  Location: [facility];  Service: General;  Laterality: N/A;  . Laparotomy N/A 01/10/2013    Procedure: EXPLORATORY LAPAROTOMY;  Surgeon: Lodema PilotBrian Layton, DO;  Location: [facility];  Service: General;  Laterality: N/A;  . Incisional hernia repair N/A 03/01/2014    Procedure: LAPAROSCOPIC INCISIONAL HERNIA;  Surgeon: Axel FillerArmando Ramirez, MD;  Location: [facility];  Service: General;  Laterality: N/A;  . Insertion of mesh N/A 03/01/2014    Procedure: INSERTION OF MESH;  Surgeon: Axel FillerArmando Ramirez, MD;  Location: [facility];  Service: General;  Laterality: N/A;  . Hernia repair  03/01/14    Lap incisional hernia repair w/mesh  . Appendectomy    . Carpal tunnel release Right 09/13/2014    Procedure: RIGHT WRIST CARPAL TUNNEL RELEASE;  Surgeon: Eulas PostJoshua P Landau, MD;  Location: [facility];  Service: Orthopedics;  Laterality: Right;  . Steriod injection Left 09/13/2014    Procedure: STEROID INJECTION LEFT HAND;  Surgeon: Eulas PostJoshua P Landau, MD;  Location: [facility];  Service: Orthopedics;  Laterality: Left;  . Carpal  tunnel release Left 02/28/2015    Procedure: LEFT CARPAL TUNNEL RELEASE;  Surgeon: Teryl Lucy, MD;  Location: [facility];  Service: Orthopedics;  Laterality: Left;    There were no vitals filed for this visit.  Visit Diagnosis:  Muscle weakness (generalized)  Unsteadiness  Poor tolerance for activity      Subjective Assessment - 01/15/16 1319    Subjective doesn't remember having Monday appts scheduled.  has had 1 fall since eval-missed chair tyring to sit   Currently in Pain? Yes   Pain Location Buttocks   Pain Orientation Right   Pain Descriptors / Indicators Aching   Pain Type Acute pain   Pain Radiating Towards RLE   Pain Onset 1 to 4 weeks ago   Pain Frequency  Intermittent   Aggravating Factors  prolonged sitting   Pain Relieving Factors lying down, resting, lying on other side  will monitor however will not directly address           Neuro Re-ed: Corner balance exercises:    On Level Ground: Feet together with EC horizontal/vertical head turns x 10 On compliant surface: feet apart with EC 5x10 sec; feet together with EO horizontal/vertical head turns x 10             [facility] Adult PT Treatment/Exercise - 01/15/16 1340    Exercises   Exercises Knee/Hip   Knee/Hip Exercises: Aerobic   Nustep L4x5 min   Knee/Hip Exercises: Supine   Bridges Both;10 reps   Knee/Hip Exercises: Sidelying   Clams x10 bil                PT Education - 01/15/16 1355    Education provided Yes   Education Details HEP   Person(s) Educated Patient   Methods Explanation;Demonstration;Handout   Comprehension Verbalized understanding;Returned demonstration;Need further instruction             PT Long Term Goals - 01/01/16 1327    PT LONG TERM GOAL #1   Title Pt will be independent with HEP in order to decrease fall risk and improve functional mobility.  (Target Date: 01/29/16)   PT LONG TERM GOAL #2   Title Pt will increase FGA to >19/30 in order to indicate decreased fall risk.    PT LONG TERM GOAL #3   Title Pt will verbalize no more than 2 point increase in pain with all functional mobility to indicate pain no longer limiting factor in mobility.    PT LONG TERM GOAL #4   Title Pt will verbalize return to community fitness program in order to continue progress made in therapy.     PT LONG TERM GOAL #5   Title Pt will ambulate over all outdoor surfaces at mod I level >1000' in order to indicate safe community negotiation.                Plan - 01/15/16 1356    Clinical Impression Statement Pt demonstrates difficulty with narrow BOS and EC activities.  Increased sway with corner balance exercises.  Will continue to benefit from  PT to maximize function and improve balance.   PT Next Visit Plan review HEP; continue balance, gait and hip strengthening   Consulted and Agree with Plan of Care Patient        Problem List Patient Active Problem List   Diagnosis Date Noted  . At high risk for falls 12/25/2015  . Secondary hyperparathyroidism of renal origin (HCC) 08/08/2015  . CKD (chronic kidney  disease) stage 3, GFR 30-59 ml/min 08/05/2015  . Preventative health care 06/18/2015  . Lateral epicondylitis of right elbow 04/17/2015  . Degenerative lumbar disc 12/30/2014  . Cough   . Degenerative joint disease involving multiple joints on both sides of body (right foot pain) 08/14/2014  . Chronic low back pain 04/15/2014  . Hot flashes 01/02/2014  . Fibromyalgia syndrome 06/18/2013  . Abdominal pain, epigastric 03/29/2013  . Myalgia 12/21/2012  . Chronic chest pain 11/21/2012  . Carpal tunnel syndrome, bilateral 07/25/2012  . Tension headache, chronic 05/24/2012  . Iron deficiency anemia 09/22/2011  . Depression 07/14/2011  . Anxiety 06/02/2011  . Lumbar spondylosis with myelopathy 11/20/2010  . Coronary atherosclerosis 03/10/2010  . Dyslipidemia 12/11/2008  . Vitamin D deficiency 11/26/2008  . Controlled type 2 diabetes mellitus with insulin therapy (HCC) 05/25/2007  . PERIPHERAL NEUROPATHY 05/25/2007  . Essential hypertension 05/25/2007  . GERD 05/25/2007  . SYMPTOM, APNEA, SLEEP NOS 05/25/2007   Clarita Crane, PT, DPT 01/15/2016 2:01 PM  [facility] [address] [place], [place], [address] Phone: [phone]   Fax:  [phone]  Name: [name] MRN: [id number] Date of Birth: October 16, 1951

## 2016-01-19 ENCOUNTER — Ambulatory Visit (INDEPENDENT_AMBULATORY_CARE_PROVIDER_SITE_OTHER): Payer: 59 | Admitting: Clinical

## 2016-01-19 ENCOUNTER — Ambulatory Visit: Payer: Medicare Other | Admitting: Physical Therapy

## 2016-01-19 ENCOUNTER — Encounter: Payer: Self-pay | Admitting: Physical Therapy

## 2016-01-19 ENCOUNTER — Encounter (HOSPITAL_COMMUNITY): Payer: Self-pay | Admitting: Clinical

## 2016-01-19 DIAGNOSIS — R2681 Unsteadiness on feet: Secondary | ICD-10-CM

## 2016-01-19 DIAGNOSIS — F331 Major depressive disorder, recurrent, moderate: Secondary | ICD-10-CM | POA: Diagnosis not present

## 2016-01-19 DIAGNOSIS — M6281 Muscle weakness (generalized): Secondary | ICD-10-CM

## 2016-01-19 DIAGNOSIS — Z789 Other specified health status: Secondary | ICD-10-CM

## 2016-01-19 DIAGNOSIS — F4321 Adjustment disorder with depressed mood: Secondary | ICD-10-CM | POA: Diagnosis not present

## 2016-01-19 DIAGNOSIS — F411 Generalized anxiety disorder: Secondary | ICD-10-CM | POA: Diagnosis not present

## 2016-01-19 DIAGNOSIS — M545 Low back pain: Secondary | ICD-10-CM | POA: Diagnosis not present

## 2016-01-19 NOTE — Progress Notes (Addendum)
   THERAPIST PROGRESS NOTE  Session Time: 9:55 - 10:55  Participation Level: Active  Behavioral Response: CasualAlertNA  Type of Therapy: Individual Therapy  Treatment Goals addressed: Improve Psychiatric Symptoms, Calming Skills, Emotional Regulation Skills, Interpersonal Relationship Skills  Interventions: Motivational Interviewing, CBT, Grounding & Mindfulness Techniques  Summary: [name] is a 65 y.o. female who presents with Major Depressive Disorder, recurrent moderate, Generalized Anxiety Disorder, Grief   Suicidal/Homicidal: No -without intent/plan  Therapist Response:  [name] met with clinician for an individual session. [name] shared about her psychiatric symptoms, her current life events and her homework. [name] shared that she has been doing all right lately. She shared that she has been in a better mood even though life challenges continue. She shared that she has not been able to return to silver slippers ( physical activities) due to having issues with her equilibrium. She shared that her and her husband are getting a long better though he continues to drink. Client and clinician discussed the interpersonal relationship skills discussed in therapy. [name] shared that she continues to make efforts to connect with friends and church members. Client and clinician discussed the importance of friendship and connection for improving mood. [name] shared that she has not been keeping up with writing in her gratitude journal but has been "counting her blessing" when she recognizes them. She shared that she still misses her daughter, but feels more acceptance because of the positive head space she was when she died. She shared about her grief process and her work to balance allowing the grief process and also using her emotional regulation skills to help keep er balanced. Client and clinician discussed her balance and clinician suggested that after her next appointment (which is in 2 weeks)  that sessions be set 3 weeks apart.    Plan: Return again in 2-3 weeks.  Diagnosis:     Axis I: Major Depressive Disorder, recurrent moderate, Generalized Anxiety Disorder, Grief    Alonza Knisley A, LCSW 01/19/2016

## 2016-01-19 NOTE — Therapy (Signed)
[facility] [address] [place], [place], [address] Phone: [phone]   Fax:  [phone]  Physical Therapy Treatment  Patient Details  Name: [name] MRN: [id number] Date of Birth: 1951-04-27 Referring Provider: Tyson Alias, MD  Encounter Date: 01/19/2016      PT End of Session - 01/19/16 1538    Visit Number 3   Number of Visits 9   Date for PT Re-Evaluation 01/31/16   Authorization Type UHC MCR G codes every 10th visit   PT Start Time 1446   PT Stop Time 1529   PT Time Calculation (min) 43 min   Equipment Utilized During Treatment Gait belt   Activity Tolerance Patient tolerated treatment well   Behavior During Therapy [facility] for tasks assessed/performed      Past Medical History  Diagnosis Date  . Hypertension   . Peripheral neuropathy (HCC)   . Cervicalgia   . Low back pain syndrome   . Hyperlipidemia   . Vitamin D deficiency   . Vitamin B12 deficiency   . Iliotibial band syndrome   . Anemia   . Borderline personality disorder   . Nonorganic psychosis   . GERD (gastroesophageal reflux disease)   . Diabetes mellitus   . Depression   . Diabetic gastroparesis (HCC)   . CAD (coronary artery disease)     Catherization 11/18/09>nonobstructive CAD , normal LV function, EF 65%  . Health maintenance examination     Colonoscopy 2009> normal; Diabetic eye exam 12/2008. No diabetic retinopathy; Mammogram 11/10> No evidence of malignancy, DXA 03/14/13 : normal  . Headache(784.0)   . Arthritis   . SBO (small bowel obstruction) (HCC) 01/09/2013  . Diastolic CHF (HCC)     Grade I, on Echo 06/2013, EF 55-60%  . Sleep apnea     does not wear CPAP   . Diabetes mellitus, type II (HCC)   . Wears glasses   . Wears dentures     upper  . Gout   . Right carpal tunnel syndrome 09/13/2014    Past Surgical History  Procedure Laterality Date  . Abdominal hysterectomy    . Hand surgery  1992    rt  .  Bowel resection N/A 01/10/2013    Procedure: SMALL BOWEL RESECTION;  Surgeon: Lodema Pilot, DO;  [facility]: [facility];  Service: General;  Laterality: N/A;  . Lysis of adhesion N/A 01/10/2013    Procedure: LYSIS OF ADHESION;  Surgeon: Lodema Pilot, DO;  [facility]: [facility];  Service: General;  Laterality: N/A;  . Laparotomy N/A 01/10/2013    Procedure: EXPLORATORY LAPAROTOMY;  Surgeon: Lodema Pilot, DO;  Location: [facility];  Service: General;  Laterality: N/A;  . Incisional hernia repair N/A 03/01/2014    Procedure: LAPAROSCOPIC INCISIONAL HERNIA;  Surgeon: Axel Filler, MD;  Location: [facility];  Service: General;  Laterality: N/A;  . Insertion of mesh N/A 03/01/2014    Procedure: INSERTION OF MESH;  Surgeon: Axel Filler, MD;  Location: [facility];  Service: General;  Laterality: N/A;  . Hernia repair  03/01/14    Lap incisional hernia repair w/mesh  . Appendectomy    . Carpal tunnel release Right 09/13/2014    Procedure: RIGHT WRIST CARPAL TUNNEL RELEASE;  Surgeon: Eulas Post, MD;  Location: [facility];  Service: Orthopedics;  Laterality: Right;  . Steriod injection Left 09/13/2014    Procedure: STEROID INJECTION LEFT HAND;  Surgeon: Eulas Post, MD;  Location: [facility];  Service: Orthopedics;  Laterality: Left;  . Carpal tunnel release Left 02/28/2015    Procedure: LEFT CARPAL TUNNEL RELEASE;  Surgeon: Teryl LucyJoshua Landau, MD;  Location: [facility];  Service: Orthopedics;  Laterality: Left;    There were no vitals filed for this visit.  Visit Diagnosis:  Muscle weakness (generalized)  Unsteadiness  Poor tolerance for activity      Subjective Assessment - 01/19/16 1449    Subjective Patient reports no new falls. Patient reports feeling "okay" today.   Currently in Pain? Yes   Pain Score 6    Pain Location Buttocks   Pain Orientation Right   Pain Descriptors / Indicators Burning   Pain Onset More than a month ago   Pain Frequency Intermittent           [facility] Adult PT Treatment/Exercise - 01/19/16 1530    Neuro Re-ed    Neuro Re-ed Details  Corner exercises from HEP: on pillows head movement up/down, left/right, eyes closed head up/down with supervision. Tandem walk, side-stepping down/back, reverse walk, toe walk, heel walk, crossover down/back x 20' each min/guard to minA. At sink, tandem stance x 45" no UE support each foot forward min/guard. Hallway ambulation with head turns up/down, left/right, diagonals min/guard to minA with patient reporting some slight dizziness at end of diagonal trials. No significant LOB noted.    Exercises   Exercises Knee/Hip   Knee/Hip Exercises: Standing   Hip Abduction Stengthening;Both;1 set;10 reps   Knee/Hip Exercises: Supine   Bridges Both;10 reps   Knee/Hip Exercises: Sidelying   Clams x10 bil          PT Education - 01/19/16 1538    Education provided Yes   Education Details Reviewed HEP with patient with minimal cues for technique; patient grasped exercises well.   Person(s) Educated Patient   Methods Explanation;Verbal cues   Comprehension Verbalized understanding;Returned demonstration           PT Long Term Goals - 01/01/16 1327    PT LONG TERM GOAL #1   Title Pt will be independent with HEP in order to decrease fall risk and improve functional mobility.  (Target Date: 01/29/16)   PT LONG TERM GOAL #2   Title Pt will increase FGA to >19/30 in order to indicate decreased fall risk.    PT LONG TERM GOAL #3   Title Pt will verbalize no more than 2 point increase in pain with all functional mobility to indicate pain no longer limiting factor in mobility.    PT LONG TERM GOAL #4   Title Pt will verbalize return to community fitness program in order to continue progress made in therapy.     PT LONG TERM GOAL #5   Title Pt will ambulate over all outdoor surfaces at mod I level >1000' in order to indicate safe community negotiation.           Plan - 01/19/16 1539    Clinical  Impression Statement Skilled session reviewing HEP initiated last visit and addressing deficits in hip strength and static/dynamic balance. Patient performed all HEP exercises with only minimal cues for technique. Patient appears to excel in static balance activities but struggles with dynamic balance activities, particularly those which narrow the BOS, such as tandem walking and crossovers. Patient is making steady progress toward goals.   PT Next Visit Plan Continue dynamic balance, gait and hip strengthening.   PT Home Exercise Plan Consider addition of more hip strengthening exercises or more difficult corner exercises,  such as adding diagonal head movement.   Consulted and Agree with Plan of Care Patient      Problem List Patient Active Problem List   Diagnosis Date Noted  . At high risk for falls 12/25/2015  . Secondary hyperparathyroidism of renal origin (HCC) 08/08/2015  . CKD (chronic kidney disease) stage 3, GFR 30-59 ml/min 08/05/2015  . Preventative health care 06/18/2015  . Lateral epicondylitis of right elbow 04/17/2015  . Degenerative lumbar disc 12/30/2014  . Cough   . Degenerative joint disease involving multiple joints on both sides of body (right foot pain) 08/14/2014  . Chronic low back pain 04/15/2014  . Hot flashes 01/02/2014  . Fibromyalgia syndrome 06/18/2013  . Abdominal pain, epigastric 03/29/2013  . Myalgia 12/21/2012  . Chronic chest pain 11/21/2012  . Carpal tunnel syndrome, bilateral 07/25/2012  . Tension headache, chronic 05/24/2012  . Iron deficiency anemia 09/22/2011  . Depression 07/14/2011  . Anxiety 06/02/2011  . Lumbar spondylosis with myelopathy 11/20/2010  . Coronary atherosclerosis 03/10/2010  . Dyslipidemia 12/11/2008  . Vitamin D deficiency 11/26/2008  . Controlled type 2 diabetes mellitus with insulin therapy (HCC) 05/25/2007  . PERIPHERAL NEUROPATHY 05/25/2007  . Essential hypertension 05/25/2007  . GERD 05/25/2007  . SYMPTOM, APNEA,  SLEEP NOS 05/25/2007    Otis Dials, SPTA 01/19/2016, 3:43 PM  [facility] [address] [place], [place], [address] Phone: [phone]   Fax:  [phone]  Name: [name] MRN: [id number] Date of Birth: 02-23-1951  This note has been reviewed and edited by supervising CI.  Sallyanne Kuster, PTA, [facility] [address], [address] [place], [place] [address] [phone] 01/20/2016, 8:58 AM

## 2016-01-21 ENCOUNTER — Ambulatory Visit: Payer: Medicare Other | Admitting: Physical Therapy

## 2016-01-21 ENCOUNTER — Encounter: Payer: Self-pay | Admitting: Physical Therapy

## 2016-01-21 DIAGNOSIS — M6281 Muscle weakness (generalized): Secondary | ICD-10-CM | POA: Diagnosis not present

## 2016-01-21 DIAGNOSIS — Z789 Other specified health status: Secondary | ICD-10-CM

## 2016-01-21 DIAGNOSIS — R2681 Unsteadiness on feet: Secondary | ICD-10-CM

## 2016-01-21 DIAGNOSIS — M545 Low back pain: Secondary | ICD-10-CM | POA: Diagnosis not present

## 2016-01-21 NOTE — Therapy (Signed)
[facility] [address] [place], [place], [address] Phone: [phone]   Fax:  [phone]  Physical Therapy Treatment  Patient Details  Name: [name] MRN: [id number] Date of Birth: 07-10-1951 Referring Provider: Tyson Alias, MD  Encounter Date: 01/21/2016      PT End of Session - 01/21/16 1506    Visit Number 4   Number of Visits 9   Date for PT Re-Evaluation 01/31/16   Authorization Type UHC MCR G codes every 10th visit   PT Start Time 1316   PT Stop Time 1400   PT Time Calculation (min) 44 min   Equipment Utilized During Treatment Gait belt   Activity Tolerance Patient tolerated treatment well   Behavior During Therapy [facility] for tasks assessed/performed      Past Medical History  Diagnosis Date  . Hypertension   . Peripheral neuropathy (HCC)   . Cervicalgia   . Low back pain syndrome   . Hyperlipidemia   . Vitamin D deficiency   . Vitamin B12 deficiency   . Iliotibial band syndrome   . Anemia   . Borderline personality disorder   . Nonorganic psychosis   . GERD (gastroesophageal reflux disease)   . Diabetes mellitus   . Depression   . Diabetic gastroparesis (HCC)   . CAD (coronary artery disease)     Catherization 11/18/09>nonobstructive CAD , normal LV function, EF 65%  . Health maintenance examination     Colonoscopy 2009> normal; Diabetic eye exam 12/2008. No diabetic retinopathy; Mammogram 11/10> No evidence of malignancy, DXA 03/14/13 : normal  . Headache(784.0)   . Arthritis   . SBO (small bowel obstruction) (HCC) 01/09/2013  . Diastolic CHF (HCC)     Grade I, on Echo 06/2013, EF 55-60%  . Sleep apnea     does not wear CPAP   . Diabetes mellitus, type II (HCC)   . Wears glasses   . Wears dentures     upper  . Gout   . Right carpal tunnel syndrome 09/13/2014    Past Surgical History  Procedure Laterality Date  . Abdominal hysterectomy    . Hand surgery  1992    rt  .  Bowel resection N/A 01/10/2013    Procedure: SMALL BOWEL RESECTION;  Surgeon: Lodema Pilot, DO;  [facility];  Service: General;  Laterality: N/A;  . Lysis of adhesion N/A 01/10/2013    Procedure: LYSIS OF ADHESION;  Surgeon: Lodema Pilot, DO;  Location: [facility];  Service: General;  Laterality: N/A;  . Laparotomy N/A 01/10/2013    Procedure: EXPLORATORY LAPAROTOMY;  Surgeon: Lodema Pilot, DO;  [facility];  Service: General;  Laterality: N/A;  . Incisional hernia repair N/A 03/01/2014    Procedure: LAPAROSCOPIC INCISIONAL HERNIA;  Surgeon: Axel Filler, MD;  [facility];  Service: General;  Laterality: N/A;  . Insertion of mesh N/A 03/01/2014    Procedure: INSERTION OF MESH;  Surgeon: Axel Filler, MD;  [facility];  Service: General;  Laterality: N/A;  . Hernia repair  03/01/14    Lap incisional hernia repair w/mesh  . Appendectomy    . Carpal tunnel release Right 09/13/2014    Procedure: RIGHT WRIST CARPAL TUNNEL RELEASE;  Surgeon: Eulas Post, MD;  Location: [facility];  Service: Orthopedics;  Laterality: Right;  . Steriod injection Left 09/13/2014    Procedure: STEROID INJECTION LEFT HAND;  Surgeon: Eulas Post, MD;  Location: [facility];  Service: Orthopedics;  Laterality: Left;  . Carpal tunnel release Left 02/28/2015    Procedure: LEFT CARPAL TUNNEL RELEASE;  Surgeon: Teryl LucyJoshua Landau, MD;  Location: [facility];  Service: Orthopedics;  Laterality: Left;    There were no vitals filed for this visit.  Visit Diagnosis:  Muscle weakness (generalized)  Unsteadiness  Poor tolerance for activity      Subjective Assessment - 01/21/16 1318    Subjective Patient reports no new falls. Patient reports HEP going well. Patient verbalizes plan to return to gym tomorrow with friend.   Currently in Pain? No/denies          [facility] Adult PT Treatment/Exercise - 01/21/16 1322    Transfers   Transfers Sit to Stand;Stand to Sit    Sit to Stand Without upper extremity assist;7: Independent  x 10 reps   Stand to Sit Without upper extremity assist;7: Independent  x10 reps   Neuro Re-ed    Neuro Re-ed Details  Tandem walk forward/reverse UE support prn, side-stepping down/back, reverse walk, eyes closed walk 2 x 50', toe walk forward/reverse, heel walk UE support prn, high knee marching forward/reverse, crossover in front and behind down/back x 20' each min/guard to minA. Hallway ambulation with head turns up/down, left/right, diagonals with cognitive task of naming animals min/guard to minA. One instance LOB noted in forward tandem walk to side with patient recovery. Patient veers to R side slightly during eyes closed ambulation but corrects with verbal cues.   Exercises   Exercises Knee/Hip   Knee/Hip Exercises: Standing   Heel Raises Both;1 set;10 reps   Hip Flexion Stengthening;Both;1 set;10 reps   Hip Abduction Stengthening;Both;1 set;10 reps   Hip Extension Stengthening;Both;1 set;10 reps          PT Long Term Goals - 01/01/16 1327    PT LONG TERM GOAL #1   Title Pt will be independent with HEP in order to decrease fall risk and improve functional mobility.  (Target Date: 01/29/16)   PT LONG TERM GOAL #2   Title Pt will increase FGA to >19/30 in order to indicate decreased fall risk.    PT LONG TERM GOAL #3   Title Pt will verbalize no more than 2 point increase in pain with all functional mobility to indicate pain no longer limiting factor in mobility.    PT LONG TERM GOAL #4   Title Pt will verbalize return to community fitness program in order to continue progress made in therapy.     PT LONG TERM GOAL #5   Title Pt will ambulate over all outdoor surfaces at mod I level >1000' in order to indicate safe community negotiation.           Plan - 01/21/16 1507    Clinical Impression Statement Skilled session addressing hip/knee weakness and deficiencies in dynamic balance. Patient reports some fatigue after  hip/knee exercises but was able to proceed after seated rest break. Patient performance with dynamic balance challenges visibly improved from previous sessions, with patient verbalizing increased practice at home and increased confidence with balance challenges. Patient reports intent to return to gym this week to maximize benefits gained from therapy. Patient is making continuous progress toward goals.   PT Next Visit Plan Continue dynamic balance, gait and hip strengthening, progressing toward LTGs due next week 01/29/16.   PT Home Exercise Plan Consider further addition of hip strengthening exercises, such as functional squats or sit <> stand without UE support.   Consulted and Agree with  Plan of Care Patient      Problem List Patient Active Problem List   Diagnosis Date Noted  . At high risk for falls 12/25/2015  . Secondary hyperparathyroidism of renal origin (HCC) 08/08/2015  . CKD (chronic kidney disease) stage 3, GFR 30-59 ml/min 08/05/2015  . Preventative health care 06/18/2015  . Lateral epicondylitis of right elbow 04/17/2015  . Degenerative lumbar disc 12/30/2014  . Cough   . Degenerative joint disease involving multiple joints on both sides of body (right foot pain) 08/14/2014  . Chronic low back pain 04/15/2014  . Hot flashes 01/02/2014  . Fibromyalgia syndrome 06/18/2013  . Abdominal pain, epigastric 03/29/2013  . Myalgia 12/21/2012  . Chronic chest pain 11/21/2012  . Carpal tunnel syndrome, bilateral 07/25/2012  . Tension headache, chronic 05/24/2012  . Iron deficiency anemia 09/22/2011  . Depression 07/14/2011  . Anxiety 06/02/2011  . Lumbar spondylosis with myelopathy 11/20/2010  . Coronary atherosclerosis 03/10/2010  . Dyslipidemia 12/11/2008  . Vitamin D deficiency 11/26/2008  . Controlled type 2 diabetes mellitus with insulin therapy (HCC) 05/25/2007  . PERIPHERAL NEUROPATHY 05/25/2007  . Essential hypertension 05/25/2007  . GERD 05/25/2007  . SYMPTOM, APNEA,  SLEEP NOS 05/25/2007    Otis Dials, SPTA 01/21/2016, 3:16 PM  [facility] [address] [place], [place], [address] Phone: [phone]   Fax:  [phone]  Name: [name] MRN: [id number] Date of Birth: [birth year]  This note has been reviewed and edited by supervising CI.  Sallyanne Kuster, PTA, [facility] Outpatient Neuro [facility] [address] [place], [place] [address] [phone] 01/21/2016, 4:18 PM

## 2016-01-22 ENCOUNTER — Telehealth: Payer: Self-pay | Admitting: Internal Medicine

## 2016-01-22 NOTE — Telephone Encounter (Signed)
APPT. REMINDER CALL, LMTCB °

## 2016-01-26 ENCOUNTER — Encounter: Payer: Self-pay | Admitting: Internal Medicine

## 2016-01-26 ENCOUNTER — Ambulatory Visit (INDEPENDENT_AMBULATORY_CARE_PROVIDER_SITE_OTHER): Payer: Medicare Other | Admitting: Internal Medicine

## 2016-01-26 ENCOUNTER — Encounter: Payer: Self-pay | Admitting: Physical Therapy

## 2016-01-26 ENCOUNTER — Ambulatory Visit
Payer: Medicare Other | Attending: Student in an Organized Health Care Education/Training Program | Admitting: Physical Therapy

## 2016-01-26 VITALS — BP 139/59 | HR 82 | Temp 98.6°F | Ht 65.0 in | Wt 192.6 lb

## 2016-01-26 DIAGNOSIS — E1165 Type 2 diabetes mellitus with hyperglycemia: Secondary | ICD-10-CM

## 2016-01-26 DIAGNOSIS — Z789 Other specified health status: Secondary | ICD-10-CM | POA: Insufficient documentation

## 2016-01-26 DIAGNOSIS — R2681 Unsteadiness on feet: Secondary | ICD-10-CM

## 2016-01-26 DIAGNOSIS — M6281 Muscle weakness (generalized): Secondary | ICD-10-CM | POA: Diagnosis not present

## 2016-01-26 DIAGNOSIS — E875 Hyperkalemia: Secondary | ICD-10-CM

## 2016-01-26 DIAGNOSIS — I5032 Chronic diastolic (congestive) heart failure: Secondary | ICD-10-CM

## 2016-01-26 DIAGNOSIS — R51 Headache: Secondary | ICD-10-CM | POA: Diagnosis not present

## 2016-01-26 DIAGNOSIS — M545 Low back pain: Secondary | ICD-10-CM | POA: Insufficient documentation

## 2016-01-26 DIAGNOSIS — E119 Type 2 diabetes mellitus without complications: Secondary | ICD-10-CM

## 2016-01-26 DIAGNOSIS — Z794 Long term (current) use of insulin: Secondary | ICD-10-CM

## 2016-01-26 DIAGNOSIS — I10 Essential (primary) hypertension: Secondary | ICD-10-CM

## 2016-01-26 DIAGNOSIS — I110 Hypertensive heart disease with heart failure: Secondary | ICD-10-CM | POA: Diagnosis not present

## 2016-01-26 DIAGNOSIS — Z79899 Other long term (current) drug therapy: Secondary | ICD-10-CM

## 2016-01-26 DIAGNOSIS — G44221 Chronic tension-type headache, intractable: Secondary | ICD-10-CM

## 2016-01-26 DIAGNOSIS — I5030 Unspecified diastolic (congestive) heart failure: Secondary | ICD-10-CM | POA: Insufficient documentation

## 2016-01-26 LAB — GLUCOSE, CAPILLARY: GLUCOSE-CAPILLARY: 171 mg/dL — AB (ref 65–99)

## 2016-01-26 LAB — POCT GLYCOSYLATED HEMOGLOBIN (HGB A1C): Hemoglobin A1C: 7.7

## 2016-01-26 NOTE — Assessment & Plan Note (Signed)
Lab Results  Component Value Date   HGBA1C 7.7 01/26/2016   HGBA1C 7.5 11/12/2015   HGBA1C 6.3 08/05/2015     Assessment: Diabetes control:  not controlled Progress toward A1C goal:   deteriorated Comments: on januvia 50mg  daily and lantus 51 units qhs. Did not bring in glucometer. Denies low CBGs, denies polyuria.   Plan: Medications:  continue current medications Home glucose monitoring: Frequency:   Timing:   Instruction/counseling given: reminded to bring blood glucose meter & log to each visit Educational resources provided: brochure (denies) Self management tools provided:   Other plans: f/u in 3 months. Counseled on diabetic diet.

## 2016-01-26 NOTE — Assessment & Plan Note (Addendum)
Pt complaining of a HA that has been present since October 2016 when she fell. Her HA is located on frontal forehead, base of her skull, and b/l temporal areas. She feels like her glasses are too tight. HAs occur 2-3 times a day and everyday. She feels like her glasses are too tight on her head. Denies photophobia, blurry vision, and vomiting w/ HAs. She does have associated nausea. Sleeping makes HAs go away, she has tried tylenol and naproxen for HAs w/o relief. She has previously tried flexeril as HAs felt to be tension HA as she had significant point tenderness at the base of her skull. This was discontinued due to her gait instablitiy felt to be due to polypharmacy as a MRI of her head February 2017 was negative. Pt does not work and only gets out when she goes to her doctor's appointment, thus she states her HAs do not affect her ADL.She is on multiple medications including imdur which she is on for her blood pressure which could be the cause of her headaches. Could also be migraines however do to her multiple medications we will d/c her imdur today to see if HAs improve. If not can consider migraine medications such as propanolol and sumatriptan.   - hold imdur - f/u in 3 months or sooner if HAs do not improve - consider migraine ppx meds or referral to neurology

## 2016-01-26 NOTE — Assessment & Plan Note (Addendum)
BP Readings from Last 3 Encounters:  01/26/16 139/59  12/25/15 144/67  12/03/15 130/60    Lab Results  Component Value Date   NA 137 06/02/2015   K 4.5 06/02/2015   CREATININE 1.91* 12/11/2015    Assessment: Blood pressure control:  controlled Progress toward BP goal:   near goal Comments: on losartan 100, imdur, and lopressor 25qd  Plan: Medications:  continue current medications  Educational resources provided: brochure (denies) Self management tools provided:   Other plans: f/u in 3 months, checking BMET. Holding imdur due to daily headaches.

## 2016-01-26 NOTE — Therapy (Addendum)
[facility] [address] [place], [place], [address] Phone: [phone]   Fax:  [phone]  Physical Therapy Treatment  Patient Details  Name: [name] MRN: [id number] Date of Birth: 1951/03/22 Referring Provider: Axel Filler, MD  Encounter Date: 01/26/2016      PT End of Session - 01/26/16 1413    Visit Number 5   Number of Visits 9   Date for PT Re-Evaluation 01/31/16   Authorization Type UHC MCR G codes every 10th visit   PT Start Time 1401   PT Stop Time 1446   PT Time Calculation (min) 45 min   Equipment Utilized During Treatment Gait belt   Activity Tolerance Patient tolerated treatment well   Behavior During Therapy [facility] for tasks assessed/performed      Past Medical History  Diagnosis Date  . Hypertension   . Peripheral neuropathy ([place])   . Cervicalgia   . Low back pain syndrome   . Hyperlipidemia   . Vitamin D deficiency   . Vitamin B12 deficiency   . Iliotibial band syndrome   . Anemia   . Borderline personality disorder   . Nonorganic psychosis   . GERD (gastroesophageal reflux disease)   . Diabetes mellitus   . Depression   . Diabetic gastroparesis ([place])   . CAD (coronary artery disease)     Catherization 11/18/09>nonobstructive CAD , normal LV function, EF 65%  . Health maintenance examination     Colonoscopy 2009> normal; Diabetic eye exam 12/2008. No diabetic retinopathy; Mammogram 11/10> No evidence of malignancy, DXA 03/14/13 : normal  . Headache(784.0)   . Arthritis   . SBO (small bowel obstruction) ([place]) 01/09/2013  . Diastolic CHF ([place])     Grade I, on Echo 06/2013, EF 55-60%  . Sleep apnea     does not wear CPAP   . Diabetes mellitus, type II ([place])   . Wears glasses   . Wears dentures     upper  . Gout   . Right carpal tunnel syndrome 09/13/2014    Past Surgical History  Procedure Laterality Date  . Abdominal hysterectomy    . Hand surgery  1992    rt  .  Bowel resection N/A 01/10/2013    Procedure: SMALL BOWEL RESECTION;  Surgeon: Madilyn Hook, DO;  Location: [place];  Service: General;  Laterality: N/A;  . Lysis of adhesion N/A 01/10/2013    Procedure: LYSIS OF ADHESION;  Surgeon: Madilyn Hook, DO;  Location: [place];  Service: General;  Laterality: N/A;  . Laparotomy N/A 01/10/2013    Procedure: EXPLORATORY LAPAROTOMY;  Surgeon: Madilyn Hook, DO;  Location: [facility];  Service: General;  Laterality: N/A;  . Incisional hernia repair N/A 03/01/2014    Procedure: LAPAROSCOPIC INCISIONAL HERNIA;  Surgeon: Ralene Ok, MD;  [facility];  Service: General;  Laterality: N/A;  . Insertion of mesh N/A 03/01/2014    Procedure: INSERTION OF MESH;  Surgeon: Ralene Ok, MD;  [facility];  Service: General;  Laterality: N/A;  . Hernia repair  03/01/14    Lap incisional hernia repair w/mesh  . Appendectomy    . Carpal tunnel release Right 09/13/2014    Procedure: RIGHT WRIST CARPAL TUNNEL RELEASE;  Surgeon: Johnny Bridge, MD;  Location: [place];  Service: Orthopedics;  Laterality: Right;  . Steriod injection Left 09/13/2014    Procedure: STEROID INJECTION LEFT HAND;  Surgeon: Johnny Bridge, MD;  Location: [place];  Service: Orthopedics;  Laterality: Left;  . Carpal tunnel release Left 02/28/2015    Procedure: LEFT CARPAL TUNNEL RELEASE;  Surgeon: Marchia Bond, MD;  Location: [place];  Service: Orthopedics;  Laterality: Left;    There were no vitals filed for this visit.  Visit Diagnosis:   Muscle weakness (generalized)    Unsteadiness on feet         Subjective Assessment - 01/26/16 1404    Subjective No new falls. Has not returned to the gyms as yet, "Yeah, I am slacking off". Cleaned the house, went grocery shopping, and sat on hard surfaces at church and friends house  this weekend, now with increased right hip pain.    Limitations Walking;Sitting   Currently in Pain? Yes   Pain  Score 7    Pain Location Hip   Pain Orientation Right   Pain Descriptors / Indicators Constant;Sore   Pain Type Acute pain   Pain Onset More than a month ago   Pain Frequency Intermittent   Aggravating Factors  fall several monthes ago started it: increased activity, prolonged sitting.   Pain Relieving Factors lying down on left side or back           [facility] Adult PT Treatment/Exercise - 01/26/16 1414    Transfers   Transfers Sit to Stand;Stand to Sit   Ambulation/Gait   Ambulation/Gait Yes   Ambulation/Gait Assistance 6: Modified independent (Device/Increase time)   Ambulation Distance (Feet) 1000 Feet   Assistive device None   Gait Pattern Step-through pattern   Ambulation Surface Level;Unlevel;Indoor;Outdoor;Paved   Knee/Hip Exercises: Stretches   Piriformis Stretch Both;3 reps;30 seconds;Limitations   Other Knee/Hip Stretches double knee to chest stretch, 30 sec holds, 3 reps   Other Knee/Hip Stretches lower trunk rotation stretch left<>right, 30 sec holds x 3 each way   Knee/Hip Exercises: Seated   Sit to Sand 1 set;10 reps;without UE support     Ther-Activity body mechanics with: laundry, dishes, sweeping and lifting from floor/trunk for decreased pain/reduced risk of injury.       PT Education - 01/26/16 1445    Education provided Yes   Education Details HEP: added sit<>stand for strengthening and stretching exercises today. Body mechanices with basic household chores.   Person(s) Educated Patient   Methods Explanation;Demonstration;Handout   Comprehension Verbalized understanding;Returned demonstration             PT Long Term Goals - 01/26/16 1413    PT LONG TERM GOAL #1   Title Pt will be independent with HEP in order to decrease fall risk and improve functional mobility.  (Target Date: 01/29/16)   Status On-going   PT LONG TERM GOAL #2   Title Pt will increase FGA to >19/30 in order to indicate decreased fall risk.    PT LONG TERM GOAL #3   Title Pt  will verbalize no more than 2 point increase in pain with all functional mobility to indicate pain no longer limiting factor in mobility.    Baseline 01/26/16: no increase in pain reported  today. Pt actually reported decreased pain after streching and gait.   Status Achieved   PT LONG TERM GOAL #4   Title Pt will verbalize return to community fitness program in order to continue progress made in therapy.     Status On-going   PT LONG TERM GOAL #5   Title Pt will ambulate over all outdoor surfaces at mod I level >1000' in order to indicate safe community negotiation.  Baseline met on 01/26/16   Status Achieved           Plan - 01/26/16 1413    Clinical Impression Statement Pt has met her gait and pain scale goals as of today. Added to HEP today as well. Pt verbalizes understanding of need to return to gym to maintain goals of PT, reports plans to do so soon with a friend. Demo'd body mechanincs with education on abdominal bracing for launder, sweeping and picking up objects (laundry basket, groceries from trunk) for decreased risk of increasing pain/getting injured. Pt able to return demo with minimal cues needed in session. Pt is making steady progress toward goals.                                                             Pt will benefit from skilled therapeutic intervention in order to improve on the following deficits Abnormal gait;Decreased activity tolerance;Decreased balance;Decreased endurance;Decreased mobility;Decreased strength;Impaired perceived functional ability;Impaired sensation;Impaired flexibility;Improper body mechanics;Postural dysfunction;Pain   Rehab Potential Good   Clinical Impairments Affecting Rehab Potential history of depression   PT Frequency 2x / week   PT Duration 4 weeks   PT Treatment/Interventions ADLs/Self Care Home Management;Electrical Stimulation;DME Instruction;Gait training;Stair training;Functional mobility training;Therapeutic activities;Therapeutic  exercise;Neuromuscular re-education;Patient/family education   PT Next Visit Plan check remaining LTGs.   Consulted and Agree with Plan of Care Patient        Problem List Patient Active Problem List   Diagnosis Date Noted  . Diastolic CHF ([place]) 02/72/5366  . At high risk for falls 12/25/2015  . Secondary hyperparathyroidism of renal origin ([place]) 08/08/2015  . CKD (chronic kidney disease) stage 3, GFR 30-59 ml/min 08/05/2015  . Preventative health care 06/18/2015  . Lateral epicondylitis of right elbow 04/17/2015  . Degenerative lumbar disc 12/30/2014  . Cough   . Degenerative joint disease involving multiple joints on both sides of body (right foot pain) 08/14/2014  . Chronic low back pain 04/15/2014  . Hot flashes 01/02/2014  . Fibromyalgia syndrome 06/18/2013  . Abdominal pain, epigastric 03/29/2013  . Myalgia 12/21/2012  . Chronic chest pain 11/21/2012  . Carpal tunnel syndrome, bilateral 07/25/2012  . Tension headache, chronic 05/24/2012  . Iron deficiency anemia 09/22/2011  . Depression 07/14/2011  . Anxiety 06/02/2011  . Lumbar spondylosis with myelopathy 11/20/2010  . Coronary atherosclerosis 03/10/2010  . Dyslipidemia 12/11/2008  . Vitamin D deficiency 11/26/2008  . Controlled type 2 diabetes mellitus with insulin therapy ([place]) 05/25/2007  . PERIPHERAL NEUROPATHY 05/25/2007  . Essential hypertension 05/25/2007  . GERD 05/25/2007  . SYMPTOM, APNEA, SLEEP NOS 05/25/2007    Willow Ora, PTA, [facility] Outpatient Neuro [facility] [address], [place], [place] [address] [phone] 01/26/2016, 5:64 PM   Name: [name] MRN: [id number] Date of Birth: 1951-04-04

## 2016-01-26 NOTE — Patient Instructions (Signed)
STOP taking IMDUR as it could be causing your daily headaches.   Please bring in your glucometer to your next visit in 3 months.   Stop taking lasix, it was stopped because of your kidney function.

## 2016-01-26 NOTE — Progress Notes (Signed)
Subjective:   Patient ID: [name] female   DOB: Oct 14, 1951 65 y.o.   MRN: [id number]  HPI: [name] is a 65 y.o. with past medical history as outlined below who presents to clinic for diabetes follow up.   Please see problem list for status of the pt's chronic medical problems.  Past Medical History  Diagnosis Date  . Hypertension   . Peripheral neuropathy (HCC)   . Cervicalgia   . Low back pain syndrome   . Hyperlipidemia   . Vitamin D deficiency   . Vitamin B12 deficiency   . Iliotibial band syndrome   . Anemia   . Borderline personality disorder   . Nonorganic psychosis   . GERD (gastroesophageal reflux disease)   . Diabetes mellitus   . Depression   . Diabetic gastroparesis (HCC)   . CAD (coronary artery disease)     Catherization 11/18/09>nonobstructive CAD , normal LV function, EF 65%  . Health maintenance examination     Colonoscopy 2009> normal; Diabetic eye exam 12/2008. No diabetic retinopathy; Mammogram 11/10> No evidence of malignancy, DXA 03/14/13 : normal  . Headache(784.0)   . Arthritis   . SBO (small bowel obstruction) (HCC) 01/09/2013  . Diastolic CHF (HCC)     Grade I, on Echo 06/2013, EF 55-60%  . Sleep apnea     does not wear CPAP   . Diabetes mellitus, type II (HCC)   . Wears glasses   . Wears dentures     upper  . Gout   . Right carpal tunnel syndrome 09/13/2014   Current Outpatient Prescriptions  Medication Sig Dispense Refill  . ACCU-CHEK AVIVA PLUS test strip TEST UPTO 4 TIMES DAILY 125 each 5  . ACCU-CHEK FASTCLIX LANCETS MISC TEST five times a day 102 each 11  . albuterol (PROVENTIL HFA;VENTOLIN HFA) 108 (90 BASE) MCG/ACT inhaler Inhale 2 puffs into the lungs every 6 (six) hours as needed for wheezing or shortness of breath. 1 Inhaler 2  . aspirin 81 MG chewable tablet Chew 81 mg by mouth daily.    Marland Kitchen. BISACODYL PO Take 100 mg by mouth daily as needed.    Marland Kitchen. buPROPion (WELLBUTRIN XL) 150 MG 24 hr tablet Take 3 tablets (450 mg total)  by mouth daily. 270 tablet 1  . clonazePAM (KLONOPIN) 1 MG tablet Take 1 tablet (1 mg total) by mouth 3 (three) times daily as needed (morning, evening, bedtime). 1 tid 90 tablet 3  . cloNIDine (CATAPRES - DOSED IN MG/24 HR) 0.3 mg/24hr patch Place 1 patch (0.3 mg total) onto the skin every 7 (seven) days. 4 patch 11  . dexlansoprazole (DEXILANT) 60 MG capsule Take 1 capsule (60 mg total) by mouth daily. For reflux 30 capsule   . diclofenac sodium (VOLTAREN) 1 % GEL Apply 2 g topically 4 (four) times daily. 3 Tube 1  . Insulin Glargine (LANTUS SOLOSTAR) 100 UNIT/ML Solostar Pen Inject 51 Units into the skin at bedtime. 45 mL 4  . Insulin Pen Needle (BD PEN NEEDLE NANO U/F) 32G X 4 MM MISC Use to inject insulin into the skin 100 each 6  . isosorbide mononitrate (IMDUR) 60 MG 24 hr tablet Take 1 tablet (60 mg total) by mouth every morning. 90 tablet 4  . losartan (COZAAR) 100 MG tablet Take 1 tablet (100 mg total) by mouth daily. 90 tablet 4  . metoprolol succinate (TOPROL-XL) 25 MG 24 hr tablet Take 1 tablet (25 mg total) by mouth every morning. 90 tablet 4  .  ondansetron (ZOFRAN) 4 MG tablet Take 4 mg by mouth every 8 (eight) hours as needed for nausea.     . rosuvastatin (CRESTOR) 10 MG tablet Take 1 tablet (10 mg total) by mouth at bedtime. 30 tablet 11  . sertraline (ZOLOFT) 100 MG tablet Take 1.5 tablets (150 mg total) by mouth at bedtime. 1 and a half qam 135 tablet 1  . simethicone (GAS-X) 80 MG chewable tablet Chew 1 tablet (80 mg total) by mouth 4 (four) times daily as needed for flatulence. 100 tablet 2  . sitaGLIPtin (JANUVIA) 50 MG tablet Take 1 tablet (50 mg total) by mouth daily. 90 tablet 4  . traZODone (DESYREL) 50 MG tablet Take 1 tablet (50 mg total) by mouth at bedtime. (Patient not taking: Reported on 01/01/2016) 90 tablet 1   No current facility-administered medications for this visit.   Family History  Problem Relation Age of Onset  . Diabetes Mother   . Hypertension Mother     . Hyperlipidemia Mother   . Arthritis Mother   . Depression Mother   . Heart disease Father   . Diabetes Brother   . Heart disease Brother   . Heart disease Paternal Grandmother   . Diabetes Brother    Social History   Social History  . Marital Status: Married    Spouse Name: N/A  . Number of Children: N/A  . Years of Education: N/A   Social History Main Topics  . Smoking status: Former Smoker    Types: Cigarettes    Quit date: 10/25/2002  . Smokeless tobacco: Never Used  . Alcohol Use: No  . Drug Use: No  . Sexual Activity: Not Currently   Other Topics Concern  . None   Social History Narrative   Review of Systems: Review of Systems  Eyes: Negative.  Negative for blurred vision.  Respiratory: Negative for shortness of breath.   Cardiovascular: Positive for chest pain (substernal, feels "like food wants to get stuck"). Negative for orthopnea (states feels more comfortable laying flat) and leg swelling (currently none, has not taken lasix in 3-4 weeks).  Gastrointestinal: Positive for nausea (w/ HAs) and abdominal pain (chronic). Negative for vomiting.  Neurological: Headaches: 2-3 x day everyday, resolves w/ sleep, tried naproxen.  Endo/Heme/Allergies: Negative for polydipsia.  Psychiatric/Behavioral: Positive for depression (follows w/ psych).    Objective:  Physical Exam: Filed Vitals:   01/26/16 0949  BP: 139/59  Pulse: 82  Temp: 98.6 F (37 C)  TempSrc: Oral  Height:  (1.651 m)  Weight: 192 lb 9.6 oz (87.363 kg)  SpO2: 99%   Physical Exam  Constitutional: She appears well-developed and well-nourished. No distress.  HENT:  Head: Normocephalic and atraumatic.  Nose: Nose normal.  Eyes: EOM are normal. No scleral icterus.  Cardiovascular: Normal rate, regular rhythm and normal heart sounds.  Exam reveals no gallop and no friction rub.   No murmur heard. Pulmonary/Chest: Effort normal and breath sounds normal. No respiratory distress. She has no  wheezes. She has no rales.  Abdominal: Soft. Bowel sounds are normal. She exhibits no distension and no mass. There is no tenderness. There is no rebound and no guarding.  Musculoskeletal:  Negative for pedal edema   Skin: Skin is warm and dry. No rash noted. She is not diaphoretic. No erythema. No pallor.    Assessment & Plan:   Please see problem based assessment and plan.

## 2016-01-26 NOTE — Patient Instructions (Signed)
Functional Quadriceps: Sit to Stand    Sit on edge of chair, feet flat on floor. Stand upright, extending knees fully. Repeat _10_ times per set. Do _1-2_ sets per session. Do _1-2_ sessions per day.  http://orth.exer.us/735   Copyright  VHI. All rights reserved.  Knee-to-Chest Stretch: Bilateral    With hands behind knees, pull both knees in to chest until a comfortable stretch is felt in lower back and buttocks. Keep back relaxed. Hold _30__ seconds. Repeat _3__ times per set. Do _1_ sets per session. Do _1-2_ sessions per day.  http://orth.exer.us/128   Copyright  VHI. All rights reserved.  Lower Trunk Rotation Stretch    Keeping back flat and feet together, rotate knees to left side. Hold __30__ seconds. Rotate to other side, hold for 30 seconds. Repeat __3__ times each way. Do __1_ sets per session. Do _1-2_ sessions per day.  http://orth.exer.us/123   Copyright  VHI. All rights reserved.  Chair Sitting    Sit at edge of seat, spine straight, one leg extended. Put a hand on each thigh and bend forward from the hip, keeping spine straight. Allow hand on extended leg to reach toward toes. Support upper body with other arm. Hold __30_ seconds. Repeat _3__ times per session. Do _1-2_ sessions per day.  Copyright  VHI. All rights reserved.  Calf Stretch    Place one leg forward, bent, other leg behind and straight. Lean forward keeping back heel flat. Hold _30__ seconds while counting out loud. Repeat with other leg forward. Repeat __3__ times each leg forward. Do _1-2__ sessions per day.  http://gt2.exer.us/478   Copyright  VHI. All rights reserved.

## 2016-01-26 NOTE — Assessment & Plan Note (Addendum)
Pt has grade 1 diastolic HF noted on ECHO in 2014. Also had a low risk stress test in 2015. An appointment was made in clinic b/c pt states she had gained 2-3 lbs and took lasix that she had left over from previous prescriptions. She last took lasix 40mg  3-4 weeks ago. Denies orthopnea and actually states she is more comfortable laying flat. On exam she has no pedal edema nor crackles. Appears euvolemic. She was taken off of lasix last year due to an AKI. From chart review her weight is stable, today is is 192lbs from 193lbs on 12/25/15, other weights from clinic visits earlier last year are 190 and 192.   - she will continue her current regimen without lasix, this rx bottle was discarded in the clinic - BMET ordered to f/u creatinine

## 2016-01-27 ENCOUNTER — Other Ambulatory Visit: Payer: Self-pay | Admitting: Internal Medicine

## 2016-01-27 DIAGNOSIS — E875 Hyperkalemia: Secondary | ICD-10-CM | POA: Insufficient documentation

## 2016-01-27 LAB — BMP8+ANION GAP
ANION GAP: 18 mmol/L (ref 10.0–18.0)
BUN/Creatinine Ratio: 17 (ref 12–28)
BUN: 28 mg/dL — AB (ref 8–27)
CALCIUM: 9.9 mg/dL (ref 8.7–10.3)
CO2: 22 mmol/L (ref 18–29)
CREATININE: 1.63 mg/dL — AB (ref 0.57–1.00)
Chloride: 102 mmol/L (ref 96–106)
GFR, EST AFRICAN AMERICAN: 38 mL/min/{1.73_m2} — AB (ref >59)
GFR, EST NON AFRICAN AMERICAN: 33 mL/min/{1.73_m2} — AB (ref >59)
Glucose: 169 mg/dL — ABNORMAL HIGH (ref 65–99)
POTASSIUM: 5.5 mmol/L — AB (ref 3.5–5.2)
Sodium: 142 mmol/L (ref 134–144)

## 2016-01-27 NOTE — Assessment & Plan Note (Signed)
K 5.5 on BMET checked this visit. Pt to schedule an appt next week for lab visit only. Will place future order for BMET now.

## 2016-01-27 NOTE — Progress Notes (Signed)
Internal Medicine Clinic Attending  Case discussed with Dr. Truong at the time of the visit.  We reviewed the resident's history and exam and pertinent patient test results.  I agree with the assessment, diagnosis, and plan of care documented in the resident's note.  

## 2016-01-27 NOTE — Addendum Note (Signed)
Addended by: Denton BrickRUONG, Sequoia Witz M on: 01/27/2016 01:07 PM   Modules accepted: Orders

## 2016-01-29 ENCOUNTER — Encounter: Payer: Self-pay | Admitting: Physical Therapy

## 2016-01-29 ENCOUNTER — Ambulatory Visit: Payer: Medicare Other | Admitting: Physical Therapy

## 2016-01-29 ENCOUNTER — Other Ambulatory Visit: Payer: Self-pay

## 2016-01-29 DIAGNOSIS — R2681 Unsteadiness on feet: Secondary | ICD-10-CM

## 2016-01-29 DIAGNOSIS — M545 Low back pain: Secondary | ICD-10-CM | POA: Diagnosis not present

## 2016-01-29 DIAGNOSIS — Z789 Other specified health status: Secondary | ICD-10-CM | POA: Diagnosis not present

## 2016-01-29 DIAGNOSIS — M6281 Muscle weakness (generalized): Secondary | ICD-10-CM | POA: Diagnosis not present

## 2016-01-30 ENCOUNTER — Other Ambulatory Visit (INDEPENDENT_AMBULATORY_CARE_PROVIDER_SITE_OTHER): Payer: Medicare Other

## 2016-01-30 DIAGNOSIS — E875 Hyperkalemia: Secondary | ICD-10-CM

## 2016-01-30 LAB — BASIC METABOLIC PANEL
Anion gap: 11 (ref 5–15)
BUN: 18 mg/dL (ref 6–20)
CALCIUM: 9.9 mg/dL (ref 8.9–10.3)
CO2: 27 mmol/L (ref 22–32)
CREATININE: 1.93 mg/dL — AB (ref 0.44–1.00)
Chloride: 102 mmol/L (ref 101–111)
GFR calc Af Amer: 30 mL/min — ABNORMAL LOW (ref >60)
GFR, EST NON AFRICAN AMERICAN: 26 mL/min — AB (ref >60)
Glucose, Bld: 164 mg/dL — ABNORMAL HIGH (ref 65–99)
Potassium: 5.5 mmol/L — ABNORMAL HIGH (ref 3.5–5.1)
SODIUM: 140 mmol/L (ref 135–145)

## 2016-01-30 NOTE — Addendum Note (Signed)
Addended by: Moshe CiproMATTHEWS, Wilhemina Grall F on: 01/30/2016 12:04 PM   Modules accepted: Orders

## 2016-01-30 NOTE — Therapy (Addendum)
[facility] [address] [place], [place], [address] Phone: [phone]   Fax:  [phone]  Physical Therapy Treatment  Patient Details  Name: [name] MRN: [id number] Date of Birth: 1951-01-13 Referring Provider: Axel Filler, MD  Encounter Date: 01/29/2016      PT End of Session - 01/29/16 1407    Visit Number 6   Number of Visits 9   Date for PT Re-Evaluation 01/31/16   Authorization Type [employer] MCR G codes every 10th visit   PT Start Time 1402   PT Stop Time 1445   PT Time Calculation (min) 43 min   Equipment Utilized During Treatment Gait belt   Activity Tolerance Patient tolerated treatment well   Behavior During Therapy [facility] for tasks assessed/performed      Past Medical History  Diagnosis Date  . Hypertension   . Peripheral neuropathy ([place])   . Cervicalgia   . Low back pain syndrome   . Hyperlipidemia   . Vitamin D deficiency   . Vitamin B12 deficiency   . Iliotibial band syndrome   . Anemia   . Borderline personality disorder   . Nonorganic psychosis   . GERD (gastroesophageal reflux disease)   . Diabetes mellitus   . Depression   . Diabetic gastroparesis ([place])   . CAD (coronary artery disease)     Catherization 11/18/09>nonobstructive CAD , normal LV function, EF 65%  . Health maintenance examination     Colonoscopy 2009> normal; Diabetic eye exam 12/2008. No diabetic retinopathy; Mammogram 11/10> No evidence of malignancy, DXA 03/14/13 : normal  . Headache(784.0)   . Arthritis   . SBO (small bowel obstruction) ([place]) 01/09/2013  . Diastolic CHF ([place])     Grade I, on Echo 06/2013, EF 55-60%  . Sleep apnea     does not wear CPAP   . Diabetes mellitus, type II ([place])   . Wears glasses   . Wears dentures     upper  . Gout   . Right carpal tunnel syndrome 09/13/2014    Past Surgical History  Procedure Laterality Date  . Abdominal hysterectomy    . Hand surgery  1992    rt  .  Bowel resection N/A 01/10/2013    Procedure: SMALL BOWEL RESECTION;  Surgeon: Madilyn Hook, DO;  Location: [facility];  Service: General;  Laterality: N/A;  . Lysis of adhesion N/A 01/10/2013    Procedure: LYSIS OF ADHESION;  Surgeon: Madilyn Hook, DO;  Location: [facility];  Service: General;  Laterality: N/A;  . Laparotomy N/A 01/10/2013    Procedure: EXPLORATORY LAPAROTOMY;  Surgeon: Madilyn Hook, DO;  Location: [facility];  Service: General;  Laterality: N/A;  . Incisional hernia repair N/A 03/01/2014    Procedure: LAPAROSCOPIC INCISIONAL HERNIA;  Surgeon: Ralene Ok, MD;  Location: [facility];  Service: General;  Laterality: N/A;  . Insertion of mesh N/A 03/01/2014    Procedure: INSERTION OF MESH;  Surgeon: Ralene Ok, MD;  Location: [facility];  Service: General;  Laterality: N/A;  . Hernia repair  03/01/14    Lap incisional hernia repair w/mesh  . Appendectomy    . Carpal tunnel release Right 09/13/2014    Procedure: RIGHT WRIST CARPAL TUNNEL RELEASE;  Surgeon: Johnny Bridge, MD;  Location: [facility];  Service: Orthopedics;  Laterality: Right;  . Steriod injection Left 09/13/2014    Procedure: STEROID INJECTION LEFT HAND;  Surgeon: Johnny Bridge, MD;  Location: [facility];  Service: Orthopedics;  Laterality: Left;  . Carpal tunnel release Left 02/28/2015    Procedure: LEFT CARPAL TUNNEL RELEASE;  Surgeon: Marchia Bond, MD;  Location: [facility];  Service: Orthopedics;  Laterality: Left;    There were no vitals filed for this visit.      Subjective Assessment - 01/29/16 1406    Subjective No new complaints. No falls.    Limitations Walking;Sitting   Currently in Pain? Yes   Pain Score 5    Pain Location Hip   Pain Orientation Right   Pain Descriptors / Indicators Aching;Sore   Pain Type Chronic pain   Pain Onset More than a month ago   Pain Frequency Intermittent   Aggravating Factors  only feels it when going from sitting to standing, then it's  gone   Pain Relieving Factors lying down, movement            [facility] PT Assessment - 01/29/16 1409    Functional Gait  Assessment   Gait assessed  Yes   Gait Level Surface Walks 20 ft in less than 5.5 sec, no assistive devices, good speed, no evidence for imbalance, normal gait pattern, deviates no more than 6 in outside of the 12 in walkway width.   Change in Gait Speed Able to smoothly change walking speed without loss of balance or gait deviation. Deviate no more than 6 in outside of the 12 in walkway width.   Gait with Horizontal Head Turns Performs head turns smoothly with no change in gait. Deviates no more than 6 in outside 12 in walkway width   Gait with Vertical Head Turns Performs head turns with no change in gait. Deviates no more than 6 in outside 12 in walkway width.   Gait and Pivot Turn Pivot turns safely within 3 sec and stops quickly with no loss of balance.   Step Over Obstacle Is able to step over one shoe box (4.5 in total height) without changing gait speed. No evidence of imbalance.   Gait with Narrow Base of Support Is able to ambulate for 10 steps heel to toe with no staggering.   Gait with Eyes Closed Walks 20 ft, no assistive devices, good speed, no evidence of imbalance, normal gait pattern, deviates no more than 6 in outside 12 in walkway width. Ambulates 20 ft in less than 7 sec.   Ambulating Backwards Walks 20 ft, no assistive devices, good speed, no evidence for imbalance, normal gait   Steps Alternating feet, must use rail.   Total Score 28   FGA comment: pt now scoring in the low risk of falling category     Self Care: Pt performed all of her current HEP for a few reps each. With handout, no cues were needed.         PT Long Term Goals - 01/29/16 1408    PT LONG TERM GOAL #1   Title Pt will be independent with HEP in order to decrease fall risk and improve functional mobility.  (Target Date: 01/29/16)   Baseline 4/6/7: pt independent with handout    Status Achieved   PT LONG TERM GOAL #2   Title Pt will increase FGA to >19/30 in order to indicate decreased fall risk.    Baseline 01/29/16: 28/30 scored today   Status Achieved   PT LONG TERM GOAL #3   Title Pt will verbalize no more than 2 point increase in pain with all functional mobility to indicate pain no longer limiting  factor in mobility.    Baseline 01/26/16: no increase in pain reported  today. Pt actually reported decreased pain after streching and gait.   Status Achieved   PT LONG TERM GOAL #4   Title Pt will verbalize return to community fitness program in order to continue progress made in therapy.     Baseline Feb 11, 2016: member of gym, planning to return with friend as workout partner/buddy   Status Achieved   PT LONG TERM GOAL #5   Title Pt will ambulate over all outdoor surfaces at mod I level >1000' in order to indicate safe community negotiation.    Baseline met on 01/26/16   Status Achieved            Plan - February 11, 2016 1408    Clinical Impression Statement Pt has met all LTGs to date and is agreeable to discharge today.    Rehab Potential Good   Clinical Impairments Affecting Rehab Potential history of depression   PT Frequency 2x / week   PT Duration 4 weeks   PT Treatment/Interventions ADLs/Self Care Home Management;Electrical Stimulation;DME Instruction;Gait training;Stair training;Functional mobility training;Therapeutic activities;Therapeutic exercise;Neuromuscular re-education;Patient/family education   PT Next Visit Plan discharge per PT plan of care   Consulted and Agree with Plan of Care Patient      Patient will benefit from skilled therapeutic intervention in order to improve the following deficits and impairments:  Abnormal gait, Decreased activity tolerance, Decreased balance, Decreased endurance, Decreased mobility, Decreased strength, Impaired perceived functional ability, Impaired sensation, Impaired flexibility, Improper body mechanics, Postural  dysfunction, Pain  Visit Diagnosis: Muscle weakness (generalized)  Unsteadiness on feet       G-Codes - Feb 11, 2016 1547    Functional Assessment Tool Used FGA: 28/30   Functional Limitation Mobility: Walking and moving around   Mobility: Walking and Moving Around Goal Status [phone]) At least 20 percent but less than 40 percent impaired, limited or restricted   Mobility: Walking and Moving Around Discharge Status [phone]) At least 1 percent but less than 20 percent impaired, limited or restricted      Problem List Patient Active Problem List   Diagnosis Date Noted  . Hyperkalemia 01/27/2016  . Diastolic CHF ([place]) 74/05/1447  . At high risk for falls 12/25/2015  . Secondary hyperparathyroidism of renal origin ([place]) 08/08/2015  . CKD (chronic kidney disease) stage 3, GFR 30-59 ml/min 08/05/2015  . Preventative health care 06/18/2015  . Lateral epicondylitis of right elbow 04/17/2015  . Degenerative lumbar disc 12/30/2014  . Cough   . Degenerative joint disease involving multiple joints on both sides of body (right foot pain) 08/14/2014  . Chronic low back pain 04/15/2014  . Hot flashes 01/02/2014  . Fibromyalgia syndrome 06/18/2013  . Abdominal pain, epigastric 03/29/2013  . Myalgia 12/21/2012  . Chronic chest pain 11/21/2012  . Carpal tunnel syndrome, bilateral 07/25/2012  . Tension headache, chronic 05/24/2012  . Iron deficiency anemia 09/22/2011  . Depression 07/14/2011  . Anxiety 06/02/2011  . Lumbar spondylosis with myelopathy 11/20/2010  . Coronary atherosclerosis 03/10/2010  . Dyslipidemia 12/11/2008  . Vitamin D deficiency 11/26/2008  . Controlled type 2 diabetes mellitus with insulin therapy ([place]) 05/25/2007  . PERIPHERAL NEUROPATHY 05/25/2007  . Essential hypertension 05/25/2007  . GERD 05/25/2007  . SYMPTOM, APNEA, SLEEP NOS 05/25/2007    Willow Ora, PTA, [facility] [address] [place], [place]  [address] [phone] 01/30/2016, 58:85 AM   Name: [name] MRN: [id number] Date of Birth: [birth year]  PHYSICAL THERAPY DISCHARGE SUMMARY  Visits from Start of Care: 6  Current functional level related to goals / functional outcomes: See above; all goals met   Remaining deficits: n/a   Education / Equipment: HEP  Plan: Patient agrees to discharge.  Patient goals were met. Patient is being discharged due to meeting the stated rehab goals.  ?????   Laureen Abrahams, PT, DPT 01/30/2016 11:57 AM  [facility] [address] [place], [place] [address]  [phone] (office) [phone] (fax)

## 2016-01-30 NOTE — Addendum Note (Signed)
Addended by: Bufford SpikesFULCHER, Evani Shrider N on: 01/30/2016 11:36 AM   Modules accepted: Orders

## 2016-02-02 ENCOUNTER — Ambulatory Visit (HOSPITAL_COMMUNITY): Payer: Self-pay | Admitting: Clinical

## 2016-02-04 ENCOUNTER — Emergency Department (HOSPITAL_COMMUNITY): Payer: Medicare Other

## 2016-02-04 ENCOUNTER — Encounter (HOSPITAL_COMMUNITY): Payer: Self-pay | Admitting: *Deleted

## 2016-02-04 ENCOUNTER — Observation Stay (HOSPITAL_COMMUNITY)
Admission: EM | Admit: 2016-02-04 | Discharge: 2016-02-05 | Disposition: A | Discharge status: Home / Self Care | Payer: Medicare Other | Attending: Internal Medicine | Admitting: Internal Medicine

## 2016-02-04 DIAGNOSIS — E785 Hyperlipidemia, unspecified: Secondary | ICD-10-CM | POA: Insufficient documentation

## 2016-02-04 DIAGNOSIS — Z880 Allergy status to penicillin: Secondary | ICD-10-CM | POA: Insufficient documentation

## 2016-02-04 DIAGNOSIS — Z87891 Personal history of nicotine dependence: Secondary | ICD-10-CM | POA: Diagnosis not present

## 2016-02-04 DIAGNOSIS — N183 Chronic kidney disease, stage 3 unspecified: Secondary | ICD-10-CM | POA: Diagnosis present

## 2016-02-04 DIAGNOSIS — Z7982 Long term (current) use of aspirin: Secondary | ICD-10-CM | POA: Insufficient documentation

## 2016-02-04 DIAGNOSIS — R1084 Generalized abdominal pain: Secondary | ICD-10-CM | POA: Diagnosis not present

## 2016-02-04 DIAGNOSIS — D509 Iron deficiency anemia, unspecified: Secondary | ICD-10-CM | POA: Diagnosis not present

## 2016-02-04 DIAGNOSIS — N179 Acute kidney failure, unspecified: Secondary | ICD-10-CM | POA: Diagnosis not present

## 2016-02-04 DIAGNOSIS — I5032 Chronic diastolic (congestive) heart failure: Secondary | ICD-10-CM | POA: Insufficient documentation

## 2016-02-04 DIAGNOSIS — E860 Dehydration: Secondary | ICD-10-CM | POA: Insufficient documentation

## 2016-02-04 DIAGNOSIS — R109 Unspecified abdominal pain: Secondary | ICD-10-CM | POA: Insufficient documentation

## 2016-02-04 DIAGNOSIS — M25551 Pain in right hip: Secondary | ICD-10-CM | POA: Diagnosis not present

## 2016-02-04 DIAGNOSIS — Z794 Long term (current) use of insulin: Secondary | ICD-10-CM | POA: Insufficient documentation

## 2016-02-04 DIAGNOSIS — E1142 Type 2 diabetes mellitus with diabetic polyneuropathy: Secondary | ICD-10-CM | POA: Diagnosis not present

## 2016-02-04 DIAGNOSIS — I130 Hypertensive heart and chronic kidney disease with heart failure and stage 1 through stage 4 chronic kidney disease, or unspecified chronic kidney disease: Secondary | ICD-10-CM | POA: Insufficient documentation

## 2016-02-04 DIAGNOSIS — E1122 Type 2 diabetes mellitus with diabetic chronic kidney disease: Secondary | ICD-10-CM | POA: Insufficient documentation

## 2016-02-04 DIAGNOSIS — E875 Hyperkalemia: Secondary | ICD-10-CM | POA: Diagnosis present

## 2016-02-04 DIAGNOSIS — G8929 Other chronic pain: Secondary | ICD-10-CM | POA: Insufficient documentation

## 2016-02-04 DIAGNOSIS — R1013 Epigastric pain: Secondary | ICD-10-CM | POA: Diagnosis present

## 2016-02-04 DIAGNOSIS — I5030 Unspecified diastolic (congestive) heart failure: Secondary | ICD-10-CM | POA: Diagnosis present

## 2016-02-04 DIAGNOSIS — E1165 Type 2 diabetes mellitus with hyperglycemia: Secondary | ICD-10-CM

## 2016-02-04 DIAGNOSIS — F329 Major depressive disorder, single episode, unspecified: Secondary | ICD-10-CM | POA: Diagnosis not present

## 2016-02-04 DIAGNOSIS — I10 Essential (primary) hypertension: Secondary | ICD-10-CM | POA: Diagnosis present

## 2016-02-04 DIAGNOSIS — F419 Anxiety disorder, unspecified: Secondary | ICD-10-CM | POA: Diagnosis not present

## 2016-02-04 DIAGNOSIS — I2510 Atherosclerotic heart disease of native coronary artery without angina pectoris: Secondary | ICD-10-CM | POA: Diagnosis present

## 2016-02-04 DIAGNOSIS — K5909 Other constipation: Secondary | ICD-10-CM | POA: Insufficient documentation

## 2016-02-04 DIAGNOSIS — IMO0002 Reserved for concepts with insufficient information to code with codable children: Secondary | ICD-10-CM

## 2016-02-04 DIAGNOSIS — K219 Gastro-esophageal reflux disease without esophagitis: Secondary | ICD-10-CM | POA: Diagnosis present

## 2016-02-04 DIAGNOSIS — R52 Pain, unspecified: Secondary | ICD-10-CM

## 2016-02-04 LAB — BASIC METABOLIC PANEL
Anion gap: 12 (ref 5–15)
BUN: 35 mg/dL — AB (ref 6–20)
CHLORIDE: 104 mmol/L (ref 101–111)
CO2: 22 mmol/L (ref 22–32)
Calcium: 9.2 mg/dL (ref 8.9–10.3)
Creatinine, Ser: 2.44 mg/dL — ABNORMAL HIGH (ref 0.44–1.00)
GFR calc Af Amer: 23 mL/min — ABNORMAL LOW (ref >60)
GFR calc non Af Amer: 20 mL/min — ABNORMAL LOW (ref >60)
GLUCOSE: 96 mg/dL (ref 65–99)
POTASSIUM: 5.1 mmol/L (ref 3.5–5.1)
SODIUM: 138 mmol/L (ref 135–145)

## 2016-02-04 LAB — GLUCOSE, CAPILLARY: Glucose-Capillary: 151 mg/dL — ABNORMAL HIGH (ref 65–99)

## 2016-02-04 LAB — URINALYSIS, ROUTINE W REFLEX MICROSCOPIC
BILIRUBIN URINE: NEGATIVE
Glucose, UA: NEGATIVE mg/dL
Hgb urine dipstick: NEGATIVE
KETONES UR: NEGATIVE mg/dL
Leukocytes, UA: NEGATIVE
NITRITE: NEGATIVE
PROTEIN: NEGATIVE mg/dL
SPECIFIC GRAVITY, URINE: 1.013 (ref 1.005–1.030)
pH: 5.5 (ref 5.0–8.0)

## 2016-02-04 LAB — COMPREHENSIVE METABOLIC PANEL
ALBUMIN: 4.1 g/dL (ref 3.5–5.0)
ALK PHOS: 88 U/L (ref 38–126)
ALT: 23 U/L (ref 14–54)
ANION GAP: 14 (ref 5–15)
AST: 28 U/L (ref 15–41)
BILIRUBIN TOTAL: 0.4 mg/dL (ref 0.3–1.2)
BUN: 38 mg/dL — ABNORMAL HIGH (ref 6–20)
CALCIUM: 9.9 mg/dL (ref 8.9–10.3)
CO2: 24 mmol/L (ref 22–32)
Chloride: 102 mmol/L (ref 101–111)
Creatinine, Ser: 2.73 mg/dL — ABNORMAL HIGH (ref 0.44–1.00)
GFR calc Af Amer: 20 mL/min — ABNORMAL LOW (ref >60)
GFR calc non Af Amer: 17 mL/min — ABNORMAL LOW (ref >60)
GLUCOSE: 184 mg/dL — AB (ref 65–99)
Potassium: 5.6 mmol/L — ABNORMAL HIGH (ref 3.5–5.1)
Sodium: 140 mmol/L (ref 135–145)
TOTAL PROTEIN: 8.6 g/dL — AB (ref 6.5–8.1)

## 2016-02-04 LAB — SODIUM, URINE, RANDOM: SODIUM UR: 20 mmol/L

## 2016-02-04 LAB — CBG MONITORING, ED: Glucose-Capillary: 77 mg/dL (ref 65–99)

## 2016-02-04 LAB — CBC
HCT: 39.2 % (ref 36.0–46.0)
HEMOGLOBIN: 12.5 g/dL (ref 12.0–15.0)
MCH: 25.1 pg — ABNORMAL LOW (ref 26.0–34.0)
MCHC: 31.9 g/dL (ref 30.0–36.0)
MCV: 78.6 fL (ref 78.0–100.0)
PLATELETS: 278 10*3/uL (ref 150–400)
RBC: 4.99 MIL/uL (ref 3.87–5.11)
RDW: 16.7 % — AB (ref 11.5–15.5)
WBC: 7.7 10*3/uL (ref 4.0–10.5)

## 2016-02-04 LAB — LIPASE, BLOOD: Lipase: 46 U/L (ref 11–51)

## 2016-02-04 LAB — CREATININE, URINE, RANDOM: Creatinine, Urine: 162.73 mg/dL

## 2016-02-04 MED ORDER — PANTOPRAZOLE SODIUM 40 MG PO TBEC
40.0000 mg | DELAYED_RELEASE_TABLET | Freq: Every day | ORAL | Status: DC
  Administered 2016-02-05: 40 mg via ORAL
  Filled 2016-02-04: qty 1

## 2016-02-04 MED ORDER — INSULIN GLARGINE 100 UNIT/ML ~~LOC~~ SOLN
30.0000 [IU] | Freq: Every day | SUBCUTANEOUS | Status: DC
  Administered 2016-02-04: 30 [IU] via SUBCUTANEOUS
  Filled 2016-02-04 (×2): qty 0.3

## 2016-02-04 MED ORDER — SODIUM CHLORIDE 0.9 % IV BOLUS (SEPSIS)
1000.0000 mL | Freq: Once | INTRAVENOUS | Status: AC
  Administered 2016-02-04: 1000 mL via INTRAVENOUS

## 2016-02-04 MED ORDER — SERTRALINE HCL 100 MG PO TABS
150.0000 mg | ORAL_TABLET | Freq: Every day | ORAL | Status: DC
  Administered 2016-02-04: 150 mg via ORAL
  Filled 2016-02-04: qty 1

## 2016-02-04 MED ORDER — INSULIN ASPART 100 UNIT/ML ~~LOC~~ SOLN: 0.0000 [IU] | Freq: Three times a day (TID) | SUBCUTANEOUS | Status: DC

## 2016-02-04 MED ORDER — ONDANSETRON HCL 4 MG PO TABS: 4.0000 mg | ORAL_TABLET | Freq: Four times a day (QID) | ORAL | Status: DC | PRN

## 2016-02-04 MED ORDER — CLONAZEPAM 1 MG PO TABS: 1.0000 mg | ORAL_TABLET | Freq: Two times a day (BID) | ORAL | Status: DC | PRN

## 2016-02-04 MED ORDER — BUPROPION HCL ER (XL) 150 MG PO TB24: 450.0000 mg | ORAL_TABLET | Freq: Every day | ORAL | Status: DC

## 2016-02-04 MED ORDER — ONDANSETRON HCL 4 MG/2ML IJ SOLN: 4.0000 mg | Freq: Four times a day (QID) | INTRAMUSCULAR | Status: DC | PRN

## 2016-02-04 MED ORDER — METOPROLOL SUCCINATE ER 25 MG PO TB24
25.0000 mg | ORAL_TABLET | Freq: Every morning | ORAL | Status: DC
Start: 2016-02-05 — End: 2016-02-05
  Administered 2016-02-05: 25 mg via ORAL
  Filled 2016-02-04: qty 1

## 2016-02-04 MED ORDER — SODIUM CHLORIDE 0.9 % IV SOLN
INTRAVENOUS | Status: AC
  Administered 2016-02-04 – 2016-02-05 (×2): via INTRAVENOUS

## 2016-02-04 MED ORDER — ACETAMINOPHEN 325 MG PO TABS: 650.0000 mg | ORAL_TABLET | Freq: Four times a day (QID) | ORAL | Status: DC | PRN

## 2016-02-04 MED ORDER — HEPARIN SODIUM (PORCINE) 5000 UNIT/ML IJ SOLN
5000.0000 [IU] | Freq: Three times a day (TID) | INTRAMUSCULAR | Status: DC
  Administered 2016-02-04 – 2016-02-05 (×2): 5000 [IU] via SUBCUTANEOUS
  Filled 2016-02-04 (×2): qty 1

## 2016-02-04 NOTE — H&P (Signed)
Date: 02/04/2016               Patient Name:  [name] MRN: [id number]  DOB: August 23, 1951 Age / Sex: 65 y.o., female   PCP: Denton Brick, MD         Medical Service: Internal Medicine Teaching Service         Attending Physician: Dr. Cliffton Asters, MD    First Contact: Dr. Loney Loh Pager: [phone]  Second Contact: Dr. Beckie Salts Pager: [phone]       After Hours (After 5p/  First Contact Pager: [phone]  weekends / holidays): Second Contact Pager: [phone]   Chief Complaint: abdominal pain, nausea, hip pain  History of Present Illness: Patient is a 65 yo F with a PMHx of diabetes, neuropathy, GERD, gastroparesis?, CAD presenting with worsening abdominal pain. Patient states her abdominal pain is chronic but has been worse for the past 3 days. The pain is located in the periumbilical region, non-radiating, it is intermittent and 8/10 in intensity. Pain is worse with eating. Reports using a heating pad and taking Tylenol but to no avail. She has been taking Zofran at home for nausea but denies any episodes of vomiting. States she has to take Zofran several times a day for her nausea. She has chronic constipation and states her last BM was this morning. Reports having adequate PO intake. Denies having any fevers, chills, diarrhea, or hematochezia. Denies having any GERD symptoms. Denies having any dysuria and or history of kidney stones. States her "urine trickles" when goes to the bathroom and this has been going on for years. Patient also reports having right sided hip pain for the past 2 months after she fell. States the pain is worse with moving and sitting. Denies using any NSAIDs.  Records show endoscopy and colonoscopy done in 08/2008 were normal. Gastric emptying study done in 06/2015 was normal. Xray of the right hip done in the ED today did not show any acute abnormality.  Meds: No current facility-administered medications for this encounter.   Current Outpatient Prescriptions    Medication Sig Dispense Refill  . aspirin 81 MG chewable tablet Chew 81 mg by mouth daily.    Marland Kitchen BISACODYL PO Take 100 mg by mouth daily as needed.    Marland Kitchen buPROPion (WELLBUTRIN XL) 150 MG 24 hr tablet Take 3 tablets (450 mg total) by mouth daily. 270 tablet 1  . clonazePAM (KLONOPIN) 1 MG tablet Take 1 tablet (1 mg total) by mouth 3 (three) times daily as needed (morning, evening, bedtime). 1 tid 90 tablet 3  . cloNIDine (CATAPRES - DOSED IN MG/24 HR) 0.3 mg/24hr patch Place 1 patch (0.3 mg total) onto the skin every 7 (seven) days. 4 patch 11  . dexlansoprazole (DEXILANT) 60 MG capsule Take 1 capsule (60 mg total) by mouth daily. For reflux 30 capsule   . diclofenac sodium (VOLTAREN) 1 % GEL Apply 2 g topically 4 (four) times daily. 3 Tube 1  . Insulin Glargine (LANTUS SOLOSTAR) 100 UNIT/ML Solostar Pen Inject 51 Units into the skin at bedtime. 45 mL 4  . losartan (COZAAR) 100 MG tablet Take 1 tablet (100 mg total) by mouth daily. 90 tablet 4  . metoprolol succinate (TOPROL-XL) 25 MG 24 hr tablet Take 1 tablet (25 mg total) by mouth every morning. 90 tablet 4  . ondansetron (ZOFRAN) 4 MG tablet Take 4 mg by mouth every 8 (eight) hours as needed for nausea.     . rosuvastatin (CRESTOR) 10 MG  tablet Take 1 tablet (10 mg total) by mouth at bedtime. 30 tablet 11  . sertraline (ZOLOFT) 100 MG tablet Take 1.5 tablets (150 mg total) by mouth at bedtime. 1 and a half qam 135 tablet 1  . sitaGLIPtin (JANUVIA) 50 MG tablet Take 1 tablet (50 mg total) by mouth daily. 90 tablet 4  . ACCU-CHEK AVIVA PLUS test strip TEST UPTO 4 TIMES DAILY 125 each 5  . ACCU-CHEK FASTCLIX LANCETS MISC TEST five times a day 102 each 11  . albuterol (PROVENTIL HFA;VENTOLIN HFA) 108 (90 BASE) MCG/ACT inhaler Inhale 2 puffs into the lungs every 6 (six) hours as needed for wheezing or shortness of breath. 1 Inhaler 2  . Insulin Pen Needle (BD PEN NEEDLE NANO U/F) 32G X 4 MM MISC Use to inject insulin into the skin 100 each 6  .  simethicone (GAS-X) 80 MG chewable tablet Chew 1 tablet (80 mg total) by mouth 4 (four) times daily as needed for flatulence. 100 tablet 2  . traZODone (DESYREL) 50 MG tablet Take 1 tablet (50 mg total) by mouth at bedtime. (Patient not taking: Reported on 01/01/2016) 90 tablet 1    Allergies: Allergies as of 02/04/2016 - Review Complete 02/04/2016  Allergen Reaction Noted  . Cimetidine Other (See Comments)   . Indomethacin Diarrhea 03/11/2011  . Penicillins Hives and Itching   . Indomethacin Other (See Comments) 01/30/2014  . Sulfonamide derivatives     Past Medical History  Diagnosis Date  . Hypertension   . Peripheral neuropathy (HCC)   . Cervicalgia   . Low back pain syndrome   . Hyperlipidemia   . Vitamin D deficiency   . Vitamin B12 deficiency   . Iliotibial band syndrome   . Anemia   . Borderline personality disorder   . Nonorganic psychosis   . GERD (gastroesophageal reflux disease)   . Diabetes mellitus   . Depression   . Diabetic gastroparesis (HCC)   . CAD (coronary artery disease)     Catherization 11/18/09>nonobstructive CAD , normal LV function, EF 65%  . Health maintenance examination     Colonoscopy 2009> normal; Diabetic eye exam 12/2008. No diabetic retinopathy; Mammogram 11/10> No evidence of malignancy, DXA 03/14/13 : normal  . Headache(784.0)   . Arthritis   . SBO (small bowel obstruction) (HCC) 01/09/2013  . Diastolic CHF (HCC)     Grade I, on Echo 06/2013, EF 55-60%  . Sleep apnea     does not wear CPAP   . Diabetes mellitus, type II (HCC)   . Wears glasses   . Wears dentures     upper  . Gout   . Right carpal tunnel syndrome 09/13/2014   Past Surgical History  Procedure Laterality Date  . Abdominal hysterectomy    . Hand surgery  1992    rt  . Bowel resection N/A 01/10/2013    Procedure: SMALL BOWEL RESECTION;  Surgeon: Lodema PilotBrian Layton, DO;  Location: [facility] OR;  Service: General;  Laterality: N/A;  . Lysis of adhesion N/A 01/10/2013    Procedure:  LYSIS OF ADHESION;  Surgeon: Lodema PilotBrian Layton, DO;  Location: [facility] OR;  Service: General;  Laterality: N/A;  . Laparotomy N/A 01/10/2013    Procedure: EXPLORATORY LAPAROTOMY;  Surgeon: Lodema PilotBrian Layton, DO;  Location: [facility] OR;  Service: General;  Laterality: N/A;  . Incisional hernia repair N/A 03/01/2014    Procedure: LAPAROSCOPIC INCISIONAL HERNIA;  Surgeon: Axel FillerArmando Ramirez, MD;  Location: [facility];  Service: General;  Laterality: N/A;  .  Insertion of mesh N/A 03/01/2014    Procedure: INSERTION OF MESH;  Surgeon: Axel Filler, MD;  Location: [facility];  Service: General;  Laterality: N/A;  . Hernia repair  03/01/14    Lap incisional hernia repair w/mesh  . Appendectomy    . Carpal tunnel release Right 09/13/2014    Procedure: RIGHT WRIST CARPAL TUNNEL RELEASE;  Surgeon: Eulas Post, MD;  Location: [facility];  Service: Orthopedics;  Laterality: Right;  . Steriod injection Left 09/13/2014    Procedure: STEROID INJECTION LEFT HAND;  Surgeon: Eulas Post, MD;  Location: [facility];  Service: Orthopedics;  Laterality: Left;  . Carpal tunnel release Left 02/28/2015    Procedure: LEFT CARPAL TUNNEL RELEASE;  Surgeon: Teryl Lucy, MD;  Location: [facility];  Service: Orthopedics;  Laterality: Left;   Family History  Problem Relation Age of Onset  . Diabetes Mother   . Hypertension Mother   . Hyperlipidemia Mother   . Arthritis Mother   . Depression Mother   . Heart disease Father   . Diabetes Brother   . Heart disease Brother   . Heart disease Paternal Grandmother   . Diabetes Brother    Social History   Social History  . Marital Status: Married    Spouse Name: N/A  . Number of Children: N/A  . Years of Education: N/A   Occupational History  . Not on file.   Social History Main Topics  . Smoking status: Former Smoker    Types: Cigarettes    Quit date: 10/25/2002  . Smokeless tobacco: Never Used  . Alcohol Use: No  . Drug Use: No  . Sexual  Activity: Not Currently   Other Topics Concern  . Not on file   Social History Narrative    Review of Systems: Review of Systems  Constitutional: Negative for fever and chills.  HENT: Negative for congestion and sore throat.        Headaches (chronic)  Eyes: Negative for blurred vision and pain.  Respiratory: Negative for cough, shortness of breath and wheezing.   Cardiovascular: Negative for chest pain, palpitations and leg swelling.  Gastrointestinal: Positive for nausea, abdominal pain and constipation. Negative for vomiting, diarrhea and blood in stool.  Genitourinary: Negative for dysuria, urgency, frequency, hematuria and flank pain.  Musculoskeletal: Positive for joint pain. Negative for myalgias.  Skin: Negative for itching and rash.  Neurological: Negative for dizziness, sensory change and focal weakness.    Physical Exam: Blood pressure 95/58, pulse 60, temperature 97.8 F (36.6 C), temperature source Oral, resp. rate 14, SpO2 96 %. Physical Exam  Constitutional: She is oriented to person, place, and time. She appears well-developed and well-nourished. No distress.  HENT:  Head: Normocephalic and atraumatic.  Tongue and mucous membranes appear dry   Eyes: EOM are normal. Pupils are equal, round, and reactive to light.  Neck: Neck supple. No tracheal deviation present.  Cardiovascular: Normal rate, regular rhythm and intact distal pulses.  Exam reveals no gallop and no friction rub.   No murmur heard. Pulmonary/Chest: Effort normal and breath sounds normal. No respiratory distress. She has no wheezes. She has no rales.  Abdominal: Soft. Bowel sounds are normal. She exhibits no distension. There is tenderness. There is no rebound and no guarding.  Epigastrium TTP  Musculoskeletal: She exhibits no edema.  Neurological: She is alert and oriented to person, place, and time.  Skin: Skin is warm and dry. No rash noted. She is  not diaphoretic. No erythema.    Lab  results: Basic Metabolic Panel:  Recent Labs  16/10/96 1117  NA 140  K 5.6*  CL 102  CO2 24  GLUCOSE 184*  BUN 38*  CREATININE 2.73*  CALCIUM 9.9   Liver Function Tests:  Recent Labs  02/04/16 1117  AST 28  ALT 23  ALKPHOS 88  BILITOT 0.4  PROT 8.6*  ALBUMIN 4.1    Recent Labs  02/04/16 1117  LIPASE 46  CBC:  Recent Labs  02/04/16 1117  WBC 7.7  HGB 12.5  HCT 39.2  MCV 78.6  PLT 278   Urine Drug Screen: Drugs of Abuse     Component Value Date/Time   LABOPIA NEG 07/02/2015 1231   LABOPIA NONE DETECTED 11/08/2014 1849   COCAINSCRNUR NEG 07/02/2015 1231   COCAINSCRNUR NONE DETECTED 11/08/2014 1849   LABBENZ PPS 07/02/2015 1231   LABBENZ POSITIVE* 11/08/2014 1849   AMPHETMU NEG 07/02/2015 1231   AMPHETMU NONE DETECTED 11/08/2014 1849   THCU NEG 07/02/2015 1231   THCU NONE DETECTED 11/08/2014 1849   LABBARB NEG 07/02/2015 1231   LABBARB NONE DETECTED 11/08/2014 1849    Urinalysis:  Recent Labs  02/04/16 1630  COLORURINE YELLOW  LABSPEC 1.013  PHURINE 5.5  GLUCOSEU NEGATIVE  HGBUR NEGATIVE  BILIRUBINUR NEGATIVE  KETONESUR NEGATIVE  PROTEINUR NEGATIVE  NITRITE NEGATIVE  LEUKOCYTESUR NEGATIVE   Imaging results:  Dg Hip Unilat With Pelvis 2-3 Views Right  02/04/2016  CLINICAL DATA:  65 year old female who fell 2 months ago with hip pain. Initial encounter. EXAM: DG HIP (WITH OR WITHOUT PELVIS) 2-3V RIGHT COMPARISON:  CT Abdomen and Pelvis 12/21/2014 FINDINGS: Femoral heads normally located. Hip joint spaces appear stable, symmetric, and within normal limits for age. Incidental calcified flank injection granulomas. Pelvis intact. SI joints within normal limits. Sacral ale appear intact. Proximal right femur intact. Grossly intact proximal left femur. Multiple small pelvic phleboliths re- demonstrated. *INSERT* bowel gas to IMPRESSION: No acute osseous abnormality identified about the right hip or pelvis. Normal for age hip joint spaces.  Electronically Signed   By: Odessa Fleming M.D.   On: 02/04/2016 14:23    Assessment & Plan by Problem: Active Problems:   AKI (acute kidney injury) (HCC)  AKI on CKD stage III Likely pre-renal in etiology. SCr 2.73, was 1.93 on 01/30/16. Her baseline SCr is approximately 1.5. BUN was 18 on 01/30/16 and is 38 now. Patient is nauseous but denies having any vomiting or diarrhea. Reports having adequate PO intake. However, on exam it was noted her tongue and mucous membranes were dry.   -NS@ 100 cc/hr -Check orthostatic vitals  -Repeat BMET at 9 pm and in am -Check urine Na  Hyperkalemia  Outpatient labs from 01/30/16 showing K 5.5 and now it is 5.6. Likely in the setting of CKD.  -EKG pending  -Repeat BMET at 9 pm and in am  -Consider giving Kayexalate and/or insulin if K >6 and/ or EKG changes   Abdominal pain and nausea Patient reports having chronic peri-umbilical abdominal pain which is worse with eating and is associated with nausea. She is a diabetic but gastroparesis less likely because gastric emptying study done 06/2015 was normal. Colonoscopy and endoscopy done in 2009 were normal. She denies having any GERD symptoms or hematochezia. Viral gastroenteritis less likely because her symptoms are not acute in onset. Lipase is normal making acute pancreatitis less likely. Chronic pancreatitis less likely because CT abdomen and pelvis done 11/2014 is showing  normal pancreas.  -Zofran 4 mg q6 prn nausea  -Tylenol 650 mg q6 prn   DM2 A1c 7.7 on 01/26/16.  -SSI -Lantus 30 u QHS -CBG monitoring   Chronic headaches MRI brain done 12/11/15 did not show any acute abnormality.  -Tylenol 650 mg q6 prn   CAD -Metoprolol 25 mg daily   Depression -Sertraline 150 mg daily   Anxiety -Clonazepam 1 mg BID prn   Diet: carb modified   DVT/PE ppx: Subcutaneous Heparin   Dispo: Disposition is deferred at this time, awaiting improvement of current medical problems. Anticipated discharge in approximately  1-2 day(s).   The patient does have a current PCP Denton Brick, MD) and does need an [facility] hospital follow-up appointment after discharge.  The patient does not have transportation limitations that hinder transportation to clinic appointments.  Signed: John Giovanni, MD 02/04/2016, 6:02 PM

## 2016-02-04 NOTE — ED Provider Notes (Addendum)
CSN: [id number]     Arrival date & time 02/04/16  1100 History   First MD Initiated Contact with Patient 02/04/16 1300     Chief Complaint  Patient presents with  . Back Pain  . Abdominal Pain     (Consider location/radiation/quality/duration/timing/severity/associated sxs/prior Treatment) HPI Comments: Patient is a 65 year old female with a history of diabetes, neuropathy, GERD, gastroparesis, coronary artery disease presenting today with persistent nausea, generalized malaise.   Patient states she's had ongoing abdominal pain for some time now. She had had a negative endoscopy this year but has been diagnosed with diabetic gastroparesis. She states that everyday she is taking Zofran multiple times a day to prevent vomiting. Symptoms are always worse after eating but they seem to be getting worse. She also is complaining of right hip and upper leg discomfort that's been ongoing for the last 2 months and she fell. She denies taking any ibuprofen for this pain but does use Voltaren gel.  Patient denies fever, vomiting or diarrhea. She always stays constipated. Today she does complain of some difficulty urinating with an occasional burning.   Patient is a 65 y.o. female presenting with abdominal pain. The history is provided by the patient.  Abdominal Pain Pain location:  Generalized Pain quality: aching and bloating   Pain radiates to:  Does not radiate Pain severity:  Moderate Onset quality:  Gradual Timing:  Intermittent Progression:  Waxing and waning Chronicity:  Chronic Context comment:  Patient states every morning when she wakes up she has nausea and eating always makes it worse. Relieved by:  Nothing Worsened by:  Eating Associated symptoms: anorexia, constipation and nausea   Associated symptoms: no chest pain, no diarrhea, no dysuria, no fever, no shortness of breath and no vomiting   Risk factors comment:  Diabetes   Past Medical History  Diagnosis Date  . Hypertension    . Peripheral neuropathy (HCC)   . Cervicalgia   . Low back pain syndrome   . Hyperlipidemia   . Vitamin D deficiency   . Vitamin B12 deficiency   . Iliotibial band syndrome   . Anemia   . Borderline personality disorder   . Nonorganic psychosis   . GERD (gastroesophageal reflux disease)   . Diabetes mellitus   . Depression   . Diabetic gastroparesis (HCC)   . CAD (coronary artery disease)     Catherization 11/18/09>nonobstructive CAD , normal LV function, EF 65%  . Health maintenance examination     Colonoscopy 2009> normal; Diabetic eye exam 12/2008. No diabetic retinopathy; Mammogram 11/10> No evidence of malignancy, DXA 03/14/13 : normal  . Headache(784.0)   . Arthritis   . SBO (small bowel obstruction) (HCC) 01/09/2013  . Diastolic CHF (HCC)     Grade I, on Echo 06/2013, EF 55-60%  . Sleep apnea     does not wear CPAP   . Diabetes mellitus, type II (HCC)   . Wears glasses   . Wears dentures     upper  . Gout   . Right carpal tunnel syndrome 09/13/2014   Past Surgical History  Procedure Laterality Date  . Abdominal hysterectomy    . Hand surgery  1992    rt  . Bowel resection N/A 01/10/2013    Procedure: SMALL BOWEL RESECTION;  Surgeon: Lodema Pilot, DO;  Location: [facility];  Service: General;  Laterality: N/A;  . Lysis of adhesion N/A 01/10/2013    Procedure: LYSIS OF ADHESION;  Surgeon: Lodema Pilot, DO;  Location: [facility] OR;  Service: General;  Laterality: N/A;  . Laparotomy N/A 01/10/2013    Procedure: EXPLORATORY LAPAROTOMY;  Surgeon: Lodema Pilot, DO;  Location: [facility] OR;  Service: General;  Laterality: N/A;  . Incisional hernia repair N/A 03/01/2014    Procedure: LAPAROSCOPIC INCISIONAL HERNIA;  Surgeon: Axel Filler, MD;  Location: [facility];  Service: General;  Laterality: N/A;  . Insertion of mesh N/A 03/01/2014    Procedure: INSERTION OF MESH;  Surgeon: Axel Filler, MD;  Location: [facility];  Service: General;  Laterality: N/A;  . Hernia repair  03/01/14    Lap incisional  hernia repair w/mesh  . Appendectomy    . Carpal tunnel release Right 09/13/2014    Procedure: RIGHT WRIST CARPAL TUNNEL RELEASE;  Surgeon: Eulas Post, MD;  Location: [facility];  Service: Orthopedics;  Laterality: Right;  . Steriod injection Left 09/13/2014    Procedure: STEROID INJECTION LEFT HAND;  Surgeon: Eulas Post, MD;  Location: [facility];  Service: Orthopedics;  Laterality: Left;  . Carpal tunnel release Left 02/28/2015    Procedure: LEFT CARPAL TUNNEL RELEASE;  Surgeon: Teryl Lucy, MD;  Location: [facility];  Service: Orthopedics;  Laterality: Left;   Family History  Problem Relation Age of Onset  . Diabetes Mother   . Hypertension Mother   . Hyperlipidemia Mother   . Arthritis Mother   . Depression Mother   . Heart disease Father   . Diabetes Brother   . Heart disease Brother   . Heart disease Paternal Grandmother   . Diabetes Brother    Social History  Substance Use Topics  . Smoking status: Former Smoker    Types: Cigarettes    Quit date: 10/25/2002  . Smokeless tobacco: Never Used  . Alcohol Use: No   OB History    No data available     Review of Systems  Constitutional: Negative for fever.  Respiratory: Negative for shortness of breath.   Cardiovascular: Negative for chest pain.  Gastrointestinal: Positive for nausea, abdominal pain, constipation and anorexia. Negative for vomiting and diarrhea.  Genitourinary: Negative for dysuria.  All other systems reviewed and are negative.     Allergies  Cimetidine; Indomethacin; Penicillins; Indomethacin; and Sulfonamide derivatives  Home Medications   Prior to Admission medications   Medication Sig Start Date End Date Taking? Authorizing Provider  ACCU-CHEK AVIVA PLUS test strip TEST UPTO 4 TIMES DAILY 06/17/15   Denton Brick, MD  ACCU-CHEK FASTCLIX LANCETS MISC TEST five times a day 12/02/15   Denton Brick, MD  albuterol (PROVENTIL HFA;VENTOLIN HFA)  108 (90 BASE) MCG/ACT inhaler Inhale 2 puffs into the lungs every 6 (six) hours as needed for wheezing or shortness of breath. 11/19/14   Otis Brace, MD  aspirin 81 MG chewable tablet Chew 81 mg by mouth daily.    Historical Provider, MD  BISACODYL PO Take 100 mg by mouth daily as needed.    Historical Provider, MD  buPROPion (WELLBUTRIN XL) 150 MG 24 hr tablet Take 3 tablets (450 mg total) by mouth daily. 12/03/15   Archer Asa, MD  clonazePAM (KLONOPIN) 1 MG tablet Take 1 tablet (1 mg total) by mouth 3 (three) times daily as needed (morning, evening, bedtime). 1 tid 12/03/15   Archer Asa, MD  cloNIDine (CATAPRES - DOSED IN MG/24 HR) 0.3 mg/24hr patch Place 1 patch (0.3 mg total) onto the skin every 7 (seven) days. 08/05/15   Rushil Terrilee Croak, MD  dexlansoprazole (DEXILANT) 60 MG  capsule Take 1 capsule (60 mg total) by mouth daily. For reflux 11/13/14   Shuvon B Rankin, NP  diclofenac sodium (VOLTAREN) 1 % GEL Apply 2 g topically 4 (four) times daily. 04/17/15   Courtney Paris, MD  Insulin Glargine (LANTUS SOLOSTAR) 100 UNIT/ML Solostar Pen Inject 51 Units into the skin at bedtime. 02/24/15   Denton Brick, MD  Insulin Pen Needle (BD PEN NEEDLE NANO U/F) 32G X 4 MM MISC Use to inject insulin into the skin 11/08/15   Denton Brick, MD  losartan (COZAAR) 100 MG tablet Take 1 tablet (100 mg total) by mouth daily. 11/12/15 11/11/16  Denton Brick, MD  metoprolol succinate (TOPROL-XL) 25 MG 24 hr tablet Take 1 tablet (25 mg total) by mouth every morning. 09/10/15   Denton Brick, MD  ondansetron (ZOFRAN) 4 MG tablet Take 4 mg by mouth every 8 (eight) hours as needed for nausea.  04/11/14   Historical Provider, MD  rosuvastatin (CRESTOR) 10 MG tablet Take 1 tablet (10 mg total) by mouth at bedtime. 03/17/15 03/13/16  Denton Brick, MD  sertraline (ZOLOFT) 100 MG tablet Take 1.5 tablets (150 mg total) by mouth at bedtime. 1 and a half qam 12/03/15   Archer Asa, MD  simethicone (GAS-X) 80 MG chewable tablet  Chew 1 tablet (80 mg total) by mouth 4 (four) times daily as needed for flatulence. 04/17/15 04/16/16  Courtney Paris, MD  sitaGLIPtin (JANUVIA) 50 MG tablet Take 1 tablet (50 mg total) by mouth daily. 06/18/15   Denton Brick, MD  traZODone (DESYREL) 50 MG tablet Take 1 tablet (50 mg total) by mouth at bedtime. Patient not taking: Reported on 01/01/2016 09/24/15   Archer Asa, MD   BP 95/58 mmHg  Pulse 68  Temp(Src) 97.8 F (36.6 C) (Oral)  Resp 18  SpO2 95% Physical Exam  Constitutional: She is oriented to person, place, and time. She appears well-developed and well-nourished. No distress.  HENT:  Head: Normocephalic and atraumatic.  Mouth/Throat: Oropharynx is clear and moist. Mucous membranes are dry.  Eyes: Conjunctivae and EOM are normal. Pupils are equal, round, and reactive to light.  Neck: Normal range of motion. Neck supple.  Cardiovascular: Normal rate, regular rhythm and intact distal pulses.   No murmur heard. Pulmonary/Chest: Effort normal and breath sounds normal. No respiratory distress. She has no wheezes. She has no rales.  Abdominal: Soft. She exhibits no distension. There is tenderness in the epigastric area. There is no rebound and no guarding.  Musculoskeletal: Normal range of motion. She exhibits no edema or tenderness.  No tenderness with palpation of the hip or range of motion of the leg. The area she points to that is uncomfortable with standing and walking is around the sciatic notch  Neurological: She is alert and oriented to person, place, and time.  Skin: Skin is warm and dry. No rash noted. No erythema.  Psychiatric: She has a normal mood and affect. Her behavior is normal.  Nursing note and vitals reviewed.   ED Course  Procedures (including critical care time) Labs Review Labs Reviewed  COMPREHENSIVE METABOLIC PANEL - Abnormal; Notable for the following:    Potassium 5.6 (*)    Glucose, Bld 184 (*)    BUN 38 (*)    Creatinine, Ser 2.73 (*)    Total  Protein 8.6 (*)    GFR calc non Af Amer 17 (*)    GFR calc Af Amer 20 (*)  All other components within normal limits  CBC - Abnormal; Notable for the following:    MCH 25.1 (*)    RDW 16.7 (*)    All other components within normal limits  LIPASE, BLOOD  URINALYSIS, ROUTINE W REFLEX MICROSCOPIC (NOT [facility])    Imaging Review Dg Hip Unilat With Pelvis 2-3 Views Right  02/04/2016  CLINICAL DATA:  65 year old female who fell 2 months ago with hip pain. Initial encounter. EXAM: DG HIP (WITH OR WITHOUT PELVIS) 2-3V RIGHT COMPARISON:  CT Abdomen and Pelvis 12/21/2014 FINDINGS: Femoral heads normally located. Hip joint spaces appear stable, symmetric, and within normal limits for age. Incidental calcified flank injection granulomas. Pelvis intact. SI joints within normal limits. Sacral ale appear intact. Proximal right femur intact. Grossly intact proximal left femur. Multiple small pelvic phleboliths re- demonstrated. *INSERT* bowel gas to IMPRESSION: No acute osseous abnormality identified about the right hip or pelvis. Normal for age hip joint spaces. Electronically Signed   By: Odessa FlemingH  Hall M.D.   On: 02/04/2016 14:23   I have personally reviewed and evaluated these images and lab results as part of my medical decision-making.   EKG Interpretation None      MDM   Final diagnoses:  Pain  Acute kidney injury [facility](HCC)    Patient is a 65 year old female with a history of diabetes, neuropathy, GERD, gastroparesis, coronary artery disease presenting today with persistent nausea, generalized malaise.   Patient was seen by her PCP last week for diabetic follow-up. At that time she had blood work done that showed unchanged of her chronic kidney disease but because her potassium was borderline high she was scheduled to have her repeat lab check this week. Patient states that she continues to have her general abdominal issues that are not resolving. She's been diagnosed with diabetic gastroparesis but had  a negative endoscopy this year. Eating always makes her pain worse. But she states she is still drinking fluids and has not vomited. She denies any NSAID use. No chest pain or shortness of breath. She does have some mild urinary hesitancy and occasional burning feeling with urination.  UA is pending but labs today are significant for new AK I with creatinine of 2.73 from her baseline of 1.6. Potassium is 5.6 and BUN is 38. Lipase, LFTs and CBC without acute findings. UA is pending. No evidence of a septic hip and no reproducible pain with range of motion of the hip.  Some of this may be prerenal. Will give IV fluids. Also will get imaging of the hip and EKG.  UA pending but will admit for AKI   Gwyneth SproutWhitney Cristopher Ciccarelli, MD 02/04/16 [id number]  Gwyneth SproutWhitney Khalidah Herbold, MD 02/04/16 1616

## 2016-02-04 NOTE — ED Notes (Signed)
Pt reports having abd pain, back and discomfort into right buttock and hip. Having nausea and urinary hesitancy. Denies vomiting, diarrhea or fever.

## 2016-02-04 NOTE — H&P (Signed)
Date: 02/04/2016               Patient Name:  [name] MRN: [id number]  DOB: [birth year] Age / Sex: 65 y.o., female   PCP: [name] Brick, MD              Medical Service: Internal Medicine Teaching Service              Attending Physician: Dr. Burns Spain, MD    First Contact: [name]    Second Contact: [name]              After Hours (After 5p/  First Contact Pager: [phone]  weekends / holidays): Second Contact Pager: [phone]   Chief Complaint: abdominal pain  History of Present Illness: [name] is a 65 year old woman with a history of HTN, GERD, DM, diabetic gastroparesis, and CAD presenting with 3 days of abdominal pain. She has had chronic abdominal pain for a year, but it became worse on 4/9. Onset was sudden. It is now an 8/10 and has been since Sunday. It is a sharp pain localized to the middle of her abdomen. She has had nausea without vomiting since Sunday. The pain is worse after eating and sometimes after drinking. Back pain usually accompanies the abdominal pain. She has tried a heating pad which helped some, and Tylenol which did not help. Lying down helps but that causes her hip hurt. She says the pain is similar to a pancreatitis flare she had in 1992. She has had no pancreas problems since then. She is also chronically constipated without diarrhea. The pain is the same before and after bowel movements. Her last BM was this morning after taking Bisacodyl. She states she usually has to take this to have BMs. She denies blood in her stool.  She had a fall in February on 2017, since which she has bad hip pain. She ASA daily but no NSAIDs. She has had a headache since the fall. MRI of the head in February showed no abnormalities. Xray of the hip today showed no abnormalities.  She has a history of CKD. Renal US on 09/23/15 showed small, scarred kidneys bilaterally without hydronephrosis or bladder distention.   Meds: Current Facility-Administered  Medications  Medication Dose Route Frequency Provider Last Rate Last Dose  . 0.9 %  sodium chloride infusion   Intravenous Continuous [name] Hoff, MD       Current Outpatient Prescriptions  Medication Sig Dispense Refill  . aspirin 81 MG chewable tablet Chew 81 mg by mouth daily.    Marland Kitchen BISACODYL PO Take 100 mg by mouth daily as needed.    Marland Kitchen buPROPion (WELLBUTRIN XL) 150 MG 24 hr tablet Take 3 tablets (450 mg total) by mouth daily. 270 tablet 1  . clonazePAM (KLONOPIN) 1 MG tablet Take 1 tablet (1 mg total) by mouth 3 (three) times daily as needed (morning, evening, bedtime). 1 tid 90 tablet 3  . cloNIDine (CATAPRES - DOSED IN MG/24 HR) 0.3 mg/24hr patch Place 1 patch (0.3 mg total) onto the skin every 7 (seven) days. 4 patch 11  . dexlansoprazole (DEXILANT) 60 MG capsule Take 1 capsule (60 mg total) by mouth daily. For reflux 30 capsule   . diclofenac sodium (VOLTAREN) 1 % GEL Apply 2 g topically 4 (four) times daily. 3 Tube 1  . Insulin Glargine (LANTUS SOLOSTAR) 100 UNIT/ML Solostar Pen Inject 51 Units into the skin at bedtime. 45 mL 4  . losartan (  COZAAR) 100 MG tablet Take 1 tablet (100 mg total) by mouth daily. 90 tablet 4  . metoprolol succinate (TOPROL-XL) 25 MG 24 hr tablet Take 1 tablet (25 mg total) by mouth every morning. 90 tablet 4  . ondansetron (ZOFRAN) 4 MG tablet Take 4 mg by mouth every 8 (eight) hours as needed for nausea.     . rosuvastatin (CRESTOR) 10 MG tablet Take 1 tablet (10 mg total) by mouth at bedtime. 30 tablet 11  . sertraline (ZOLOFT) 100 MG tablet Take 1.5 tablets (150 mg total) by mouth at bedtime. 1 and a half qam 135 tablet 1  . sitaGLIPtin (JANUVIA) 50 MG tablet Take 1 tablet (50 mg total) by mouth daily. 90 tablet 4  . ACCU-CHEK AVIVA PLUS test strip TEST UPTO 4 TIMES DAILY 125 each 5  . ACCU-CHEK FASTCLIX LANCETS MISC TEST five times a day 102 each 11  . albuterol (PROVENTIL HFA;VENTOLIN HFA) 108 (90 BASE) MCG/ACT inhaler Inhale 2 puffs into the lungs  every 6 (six) hours as needed for wheezing or shortness of breath. 1 Inhaler 2  . Insulin Pen Needle (BD PEN NEEDLE NANO U/F) 32G X 4 MM MISC Use to inject insulin into the skin 100 each 6  . simethicone (GAS-X) 80 MG chewable tablet Chew 1 tablet (80 mg total) by mouth 4 (four) times daily as needed for flatulence. 100 tablet 2  . traZODone (DESYREL) 50 MG tablet Take 1 tablet (50 mg total) by mouth at bedtime. (Patient not taking: Reported on 01/01/2016) 90 tablet 1    Allergies: Allergies as of 02/04/2016 - Review Complete 02/04/2016  Allergen Reaction Noted  . Cimetidine Other (See Comments)   . Indomethacin Diarrhea 03/11/2011  . Penicillins Hives and Itching   . Indomethacin Other (See Comments) 01/30/2014  . Sulfonamide derivatives     Past Medical History  Diagnosis Date  . Hypertension   . Peripheral neuropathy (HCC)   . Cervicalgia   . Low back pain syndrome   . Hyperlipidemia   . Vitamin D deficiency   . Vitamin B12 deficiency   . Iliotibial band syndrome   . Anemia   . Borderline personality disorder   . Nonorganic psychosis   . GERD (gastroesophageal reflux disease)   . Diabetes mellitus   . Depression   . Diabetic gastroparesis (HCC)   . CAD (coronary artery disease)     Catherization 11/18/09>nonobstructive CAD , normal LV function, EF 65%  . Health maintenance examination     Colonoscopy 2009> normal; Diabetic eye exam 12/2008. No diabetic retinopathy; Mammogram 11/10> No evidence of malignancy, DXA 03/14/13 : normal  . Headache(784.0)   . Arthritis   . SBO (small bowel obstruction) (HCC) 01/09/2013  . Diastolic CHF (HCC)     Grade I, on Echo 06/2013, EF 55-60%  . Sleep apnea     does not wear CPAP   . Diabetes mellitus, type II (HCC)   . Wears glasses   . Wears dentures     upper  . Gout   . Right carpal tunnel syndrome 09/13/2014   Past Surgical History  Procedure Laterality Date  . Abdominal hysterectomy    . Hand surgery  1992    rt  . Bowel  resection N/A 01/10/2013    Procedure: SMALL BOWEL RESECTION;  Surgeon: Lodema Pilot, DO;  Location: [facility];  Service: General;  Laterality: N/A;  . Lysis of adhesion N/A 01/10/2013    Procedure: LYSIS OF ADHESION;  Surgeon: Lodema Pilot,  DO;  Location: [facility];  Service: General;  Laterality: N/A;  . Laparotomy N/A 01/10/2013    Procedure: EXPLORATORY LAPAROTOMY;  Surgeon: Lodema Pilot, DO;  Location: [facility];  Service: General;  Laterality: N/A;  . Incisional hernia repair N/A 03/01/2014    Procedure: LAPAROSCOPIC INCISIONAL HERNIA;  Surgeon: Axel Filler, MD;  Location: [facility];  Service: General;  Laterality: N/A;  . Insertion of mesh N/A 03/01/2014    Procedure: INSERTION OF MESH;  Surgeon: Axel Filler, MD;  Location: [facility];  Service: General;  Laterality: N/A;  . Hernia repair  03/01/14    Lap incisional hernia repair w/mesh  . Appendectomy    . Carpal tunnel release Right 09/13/2014    Procedure: RIGHT WRIST CARPAL TUNNEL RELEASE;  Surgeon: Eulas Post, MD;  Location: [facility];  Service: Orthopedics;  Laterality: Right;  . Steriod injection Left 09/13/2014    Procedure: STEROID INJECTION LEFT HAND;  Surgeon: Eulas Post, MD;  Location: [facility];  Service: Orthopedics;  Laterality: Left;  . Carpal tunnel release Left 02/28/2015    Procedure: LEFT CARPAL TUNNEL RELEASE;  Surgeon: Teryl Lucy, MD;  Location: [facility];  Service: Orthopedics;  Laterality: Left;   Family History  Problem Relation Age of Onset  . Diabetes Mother   . Hypertension Mother   . Hyperlipidemia Mother   . Arthritis Mother   . Depression Mother   . Heart disease Father   . Diabetes Brother   . Heart disease Brother   . Heart disease Paternal Grandmother   . Diabetes Brother    Social History   Social History  . Marital Status: Married    Spouse Name: N/A  . Number of Children: N/A  . Years of Education: N/A   Occupational History  . Not on file.    Social History Main Topics  . Smoking status: Former Smoker    Types: Cigarettes    Quit date: 10/25/2002  . Smokeless tobacco: Never Used  . Alcohol Use: No  . Drug Use: No  . Sexual Activity: Not Currently   Other Topics Concern  . Not on file   Social History Narrative    Review of Systems: CONSTITUTIONAL: malaise, "drained" HENT: as in HPI CV: no lightheadedness or dizziness PULM: no SOB, positive for cough, no nasal congestion GI: as in HPI GU: dribbling and feeling like not getting it all out, no hematuria MUSCULAR: muscle weakness SKELETAL: as in HPI  Physical Exam: Blood pressure 95/58, pulse 60, temperature 97.8 F (36.6 C), temperature source Oral, resp. rate 14, SpO2 96 %. GEN: mild distress, in some pain HEAD: atraumatic, normocephalic THROAT: dry, no erythema or exudates. Wears upper dentures EYES: PERRL, sclera non icteric PULM: CTAB, NWOB CV: RRR, distant heart sounds ABD: tender to light palpation in center of abdomen, normoactive bowel sounds, pain to percussion EXTREMITIES: 1+ pitting edema bilaterally, warm SKIN: dry and warm PSYCH: affect congruent with exam, speech non-pressured   Lab results: BMP Latest Ref Rng 02/04/2016 01/30/2016 01/26/2016  Glucose 65 - 99 mg/dL 161(W) 960(A) 540(J)  BUN 6 - 20 mg/dL 81(X) 18 91(Y)  Creatinine 0.44 - 1.00 mg/dL 7.82(N) 5.62(Z) 3.08(M)  BUN/Creat Ratio 12 - 28 - - 17  Sodium 135 - 145 mmol/L 140 140 142  Potassium 3.5 - 5.1 mmol/L 5.6(H) 5.5(H) 5.5(H)  Chloride 101 - 111 mmol/L 102 102 102  CO2 22 - 32 mmol/L 24 27 22   Calcium 8.9 - 10.3  mg/dL 9.9 9.9 9.9   CBC Latest Ref Rng 02/04/2016 06/02/2015 02/28/2015  WBC 4.0 - 10.5 K/uL 7.7 8.0 -  Hemoglobin 12.0 - 15.0 g/dL 16.1 11.4(L) 13.9  Hematocrit 36.0 - 46.0 % 39.2 35.9(L) 41.0  Platelets 150 - 400 K/uL 278 207 -   Lipase     Component Value Date/Time   LIPASE 46 02/04/2016 1117   Urinalysis    Component Value Date/Time   COLORURINE YELLOW  02/04/2016 1630   APPEARANCEUR CLEAR 02/04/2016 1630   LABSPEC 1.013 02/04/2016 1630   PHURINE 5.5 02/04/2016 1630   GLUCOSEU NEGATIVE 02/04/2016 1630   GLUCOSEU NEG mg/dL 09/60/4540 9811   HGBUR NEGATIVE 02/04/2016 1630   BILIRUBINUR NEGATIVE 02/04/2016 1630   KETONESUR NEGATIVE 02/04/2016 1630   PROTEINUR NEGATIVE 02/04/2016 1630   UROBILINOGEN 1.0 12/21/2014 1125   NITRITE NEGATIVE 02/04/2016 1630   LEUKOCYTESUR NEGATIVE 02/04/2016 1630    CBG (last 3)   Recent Labs  02/04/16 1809  GLUCAP 77     Imaging results:  Dg Hip Unilat With Pelvis 2-3 Views Right  02/04/2016  CLINICAL DATA:  65 year old female who fell 2 months ago with hip pain. Initial encounter. EXAM: DG HIP (WITH OR WITHOUT PELVIS) 2-3V RIGHT COMPARISON:  CT Abdomen and Pelvis 12/21/2014 FINDINGS: Femoral heads normally located. Hip joint spaces appear stable, symmetric, and within normal limits for age. Incidental calcified flank injection granulomas. Pelvis intact. SI joints within normal limits. Sacral ale appear intact. Proximal right femur intact. Grossly intact proximal left femur. Multiple small pelvic phleboliths re- demonstrated. *INSERT* bowel gas to IMPRESSION: No acute osseous abnormality identified about the right hip or pelvis. Normal for age hip joint spaces. Electronically Signed   By: Odessa Fleming M.D.   On: 02/04/2016 14:23    Assessment & Plan by Problem: Active Problems:   AKI (acute kidney injury) [facility])  Ms. Lorek is a 65 year old woman with a history of DM, diabetic gastroparesis, HTN treated with losartan, and GERD presenting with 3 days of nausea associated with abdominal and back pain. She is being admitted for AKI.  AKI:  Cr of 2.73 and BUN of 38 on admission. This is increased from 1.93 and 18 on 4/7. This is likely 2/2 to dehydration and losartan dose taken this morning. She also has dry mucous membranes. Dehydration is concerning for prerenal AKI.  - HOLD losartan  - DECREASE glargine to 51  units - IV fluids  - check urine Cr and Na   Abdominal pain: This is a chronic condition. She has a history of SBO and constipation. SBO can present with dehydration, N/V, and orthostatic hypotension. This is less likely since she has not vomited. She has chronic constipation and is volume depleted, which can cause sharp abdominal pain. She has been diagnosed with diabetic gastroparesis, though her emptying study in 06/2015 was normal. Though she does not have vomiting, dysmotility could be contributing to her constipation and pain. We will ask about adherence to insulin and consider increasing her insulin dose to control diabetic complications. - check orthostatics - encourage fluid intake  - continue stool softener regimen - increase insulin if adherent  DM: Poorly controlled. Glucose today 188, in the 160's since April 3rd. She currently takes sitagliptin 50 mg qd and glargine 100 units at home. We decreased this to 51 units at bedtime due to her AKI.  - after resolution of AKI, consider increasing insulin dose if adherent to current regimen  DVT Ppx: Lovenox  Dispo:  Disposition is deferred at this time, awaiting improvement of current medical problems. Anticipated discharge in approximately 1-3 day(s).    This is a Psychologist, occupationalMedical Student Note.  The care of the patient was discussed with Dr. Loney Lohathore and the assessment and plan was formulated with their assistance.  Please see their note for official documentation of the patient encounter.   Signed: Elton SinLacy J Bruk [place], Med Student 02/04/2016, 6:05 PM

## 2016-02-05 DIAGNOSIS — R110 Nausea: Secondary | ICD-10-CM

## 2016-02-05 DIAGNOSIS — E875 Hyperkalemia: Secondary | ICD-10-CM | POA: Diagnosis not present

## 2016-02-05 DIAGNOSIS — N179 Acute kidney failure, unspecified: Secondary | ICD-10-CM | POA: Diagnosis not present

## 2016-02-05 DIAGNOSIS — R51 Headache: Secondary | ICD-10-CM

## 2016-02-05 DIAGNOSIS — I2510 Atherosclerotic heart disease of native coronary artery without angina pectoris: Secondary | ICD-10-CM

## 2016-02-05 DIAGNOSIS — R1033 Periumbilical pain: Secondary | ICD-10-CM

## 2016-02-05 DIAGNOSIS — E119 Type 2 diabetes mellitus without complications: Secondary | ICD-10-CM

## 2016-02-05 DIAGNOSIS — Z794 Long term (current) use of insulin: Secondary | ICD-10-CM

## 2016-02-05 DIAGNOSIS — F418 Other specified anxiety disorders: Secondary | ICD-10-CM

## 2016-02-05 DIAGNOSIS — N183 Chronic kidney disease, stage 3 (moderate): Secondary | ICD-10-CM

## 2016-02-05 LAB — BASIC METABOLIC PANEL
Anion gap: 9 (ref 5–15)
BUN: 33 mg/dL — ABNORMAL HIGH (ref 6–20)
CHLORIDE: 109 mmol/L (ref 101–111)
CO2: 23 mmol/L (ref 22–32)
CREATININE: 2.09 mg/dL — AB (ref 0.44–1.00)
Calcium: 9 mg/dL (ref 8.9–10.3)
GFR calc non Af Amer: 24 mL/min — ABNORMAL LOW (ref >60)
GFR, EST AFRICAN AMERICAN: 27 mL/min — AB (ref >60)
Glucose, Bld: 83 mg/dL (ref 65–99)
POTASSIUM: 4.2 mmol/L (ref 3.5–5.1)
Sodium: 141 mmol/L (ref 135–145)

## 2016-02-05 LAB — GLUCOSE, CAPILLARY: Glucose-Capillary: 72 mg/dL (ref 65–99)

## 2016-02-05 LAB — CORTISOL: CORTISOL PLASMA: 10 ug/dL

## 2016-02-05 MED ORDER — SENNA 8.6 MG PO TABS
5.0000 | ORAL_TABLET | Freq: Every day | ORAL | Status: DC
  Administered 2016-02-05: 43 mg via ORAL
  Filled 2016-02-05: qty 5

## 2016-02-05 NOTE — Progress Notes (Signed)
[name] to be D/C'd Home per MD order.  Discussed with the patient and all questions fully answered.  VSS, Skin clean, dry and intact without evidence of skin break down, no evidence of skin tears noted. IV catheter discontinued intact. Site without signs and symptoms of complications. Dressing and pressure applied.  An After Visit Summary was printed and given to the patient. Patient received prescription.  D/c education completed with patient/family including follow up instructions, medication list, d/c activities limitations if indicated, with other d/c instructions as indicated by MD - patient able to verbalize understanding, all questions fully answered.   Patient instructed to return to ED, call 911, or call MD for any changes in condition.   Patient to be escorted via WC, and D/C home via private auto.  L'ESPERANCE, [name] C 02/05/2016 11:51 AM

## 2016-02-05 NOTE — Progress Notes (Signed)
Subjective: [name]'s abdominal pain is at her baseline pain when at home. She was able to eat and have a bowel movement. She has no new symptoms.  Objective: Vital signs in last 24 hours: Filed Vitals:   02/04/16 1800 02/04/16 1844 02/04/16 2133 02/05/16 0536  BP: 126/57 126/53 123/67 131/68  Pulse: 62 59 64 62  Temp:  97.7 F (36.5 C) 99.1 F (37.3 C) 98.3 F (36.8 C)  TempSrc:  Oral Oral Oral  Resp: Height:   (1.651 m)    Weight:  87 kg (191 lb 12.8 oz)    SpO2: 94% 99% 99% 99%   Weight change:   Intake/Output Summary (Last 24 hours) at 02/05/16 1351 Last data filed at 02/05/16 1123  Gross per 24 hour  Intake 1428.34 ml  Output    550 ml  Net 878.34 ml   Physical Exam: Constitutional: NAD. Oriented to person, place, and time. Mouth: MMM Eyes: EOM are normal.  Cardiovascular: RRR, no murmur Pulmonary/Chest: NWOB. No respiratory distress, no crackles or wheezes.  Abdominal: Non-distended, normoactive bowel sounds. Epigastric and periumbilical tenderness to palpation (chronic as per patient).  Extremities: trace edema  Lab Results: BMP Latest Ref Rng 02/05/2016 02/04/2016 02/04/2016  Glucose 65 - 99 mg/dL 83 96 962(X)  BUN 6 - 20 mg/dL 52(W) 41(L) 24(M)  Creatinine 0.44 - 1.00 mg/dL 0.10(U) 7.25(D) 6.64(Q)  Sodium 135 - 145 mmol/L 141 138 140  Potassium 3.5 - 5.1 mmol/L 4.2 5.1 5.6(H)  Chloride 101 - 111 mmol/L 109 104 102  CO2 22 - 32 mmol/L Calcium 8.9 - 10.3 mg/dL 9.0 9.2 9.9   Cortisol: 10 (ref 6.7-22.6) Capillary glucose: 72  Micro Results: No results found for this or any previous visit (from the past 240 hour(s)). Studies/Results: Dg Hip Unilat With Pelvis 2-3 Views Right  02/04/2016  CLINICAL DATA:  65 year old female who fell 2 months ago with hip pain. Initial encounter. EXAM: DG HIP (WITH OR WITHOUT PELVIS) 2-3V RIGHT COMPARISON:  CT Abdomen and Pelvis 12/21/2014 FINDINGS: Femoral heads normally located. Hip joint spaces  appear stable, symmetric, and within normal limits for age. Incidental calcified flank injection granulomas. Pelvis intact. SI joints within normal limits. Sacral ale appear intact. Proximal right femur intact. Grossly intact proximal left femur. Multiple small pelvic phleboliths re- demonstrated. *INSERT* bowel gas to IMPRESSION: No acute osseous abnormality identified about the right hip or pelvis. Normal for age hip joint spaces. Electronically Signed   By: Odessa Fleming M.D.   On: 02/04/2016 14:23   Medications:  Scheduled Meds: . heparin  5,000 Units Subcutaneous 3 times per day  . insulin aspart  0-9 Units Subcutaneous TID WC  . insulin glargine  30 Units Subcutaneous QHS  . metoprolol succinate  25 mg Oral q morning - 10a  . pantoprazole  40 mg Oral Daily  . senna  5 tablet Oral Daily  . sertraline  150 mg Oral QHS   Continuous Infusions:  PRN Meds:.acetaminophen, clonazePAM, ondansetron **OR** ondansetron (ZOFRAN) IV Assessment/Plan: Principal Problem:   AKI (acute kidney injury) (HCC) Active Problems:   Controlled type 2 diabetes mellitus with insulin therapy (HCC)   Essential hypertension   Coronary atherosclerosis   GERD   Iron deficiency anemia   Abdominal pain, epigastric   CKD (chronic kidney disease) stage 3, GFR 30-59 ml/min   Diastolic CHF (HCC)   Hyperkalemia  [name] is a 65 year old woman with a history  of DM, diabetic gastroparesis, HTN treated with losartan, and GERD presenting with 3 days of nausea associated with abdominal and back pain. She is being admitted for AKI.  AKI: Cr of 2.73 and BUN of 38 on admission. This is increased from 1.93 and 18 on 4/7. This is likely 2/2 to dehydration and losartan dose taken this morning. She also has dry mucous membranes. Dehydration is concerning for prerenal AKI. FeNa < 0.2%, which is characteristic of prerenal AKI.Creatinine back to 2.09 on 4/13. Creatinine expected to continue to drop to baseline. - d/c on home meds -  encourage good fluid intake at home   Abdominal pain: This is a chronic condition. She has a history of SBO and constipation. SBO can present with dehydration, N/V, and orthostatic hypotension. This is less likely since she has not vomited. She has chronic constipation and is volume depleted, which can cause sharp abdominal pain. She has been diagnosed with diabetic gastroparesis, though her emptying study in 06/2015 was normal. Though she does not have vomiting, dysmotility could be contributing to her constipation and pain. We will ask about adherence to insulin and consider increasing her insulin dose to control diabetic complications. - encourage fluid intake  - continue stool softener regimen  DVT Ppx: Lovenox  Dispo: Can be discharged today.  This is a Psychologist, occupationalMedical Student Note.  The care of the patient was discussed with Dr. Loney Lohathore and the assessment and plan formulated with their assistance.  Please see their attached note for official documentation of the daily encounter.     [name], Med Student 02/05/2016, 1:51 PM

## 2016-02-05 NOTE — Progress Notes (Signed)
Patient ambulated and tolerated well. Patient stated she does not feel dizzy. Patient states "I am feeling much better". MD paged to make aware.

## 2016-02-05 NOTE — Progress Notes (Signed)
Subjective: No acute overnight events. States her abdominal pain is back to her baseline which is around 7/10. She is able to tolerate PO intake well. Ate dinner last night and breakfast this morning. No nausea or vomiting. No other complaints.   Objective: Vital signs in last 24 hours: Filed Vitals:   02/04/16 1800 02/04/16 1844 02/04/16 2133 02/05/16 0536  BP: 126/57 126/53 123/67 131/68  Pulse: 62 59 64 62  Temp:  97.7 F (36.5 C) 99.1 F (37.3 C) 98.3 F (36.8 C)  TempSrc:  Oral Oral Oral  Resp: Height:   (1.651 m)    Weight:  87 kg (191 lb 12.8 oz)    SpO2: 94% 99% 99% 99%   Weight change:   Intake/Output Summary (Last 24 hours) at 02/05/16 1100 Last data filed at 02/05/16 0537  Gross per 24 hour  Intake 1188.34 ml  Output    550 ml  Net 638.34 ml   Physical Exam:  Constitutional: She is oriented to person, place, and time. She appears well-developed and well-nourished. No distress.  HENT:  Mouth: Tongue and mucous membranes are moist   Eyes: EOM are normal.  Cardiovascular: Normal rate, regular rhythm and intact distal pulses. Exam reveals no gallop and no friction rub.  No murmur heard. Pulmonary/Chest: Effort normal and breath sounds normal. No respiratory distress. She has no wheezes. She has no rales.  Abdominal: Soft. Bowel sounds are normal. She exhibits no distension. Epigastric and periumbilical regions slightly tender to palpation (chronic as per patient). There is no rebound and no guarding.  Musculoskeletal: She exhibits no edema.  Neurological: She is alert and oriented to person, place, and time.  Skin: Skin is warm and dry. No rash noted. She is not diaphoretic. No erythema.  Lab Results: Basic Metabolic Panel:  Recent Labs Lab 02/04/16 2052 02/05/16 0507  NA 138 141  K 5.1 4.2  CL 104 109  CO2 22 23  GLUCOSE 96 83  BUN 35* 33*  CREATININE 2.44* 2.09*  CALCIUM 9.2 9.0   Liver Function Tests:  Recent Labs Lab  02/04/16 1117  AST 28  ALT 23  ALKPHOS 88  BILITOT 0.4  PROT 8.6*  ALBUMIN 4.1    Recent Labs Lab 02/04/16 1117  LIPASE 46   CBC:  Recent Labs Lab 02/04/16 1117  WBC 7.7  HGB 12.5  HCT 39.2  MCV 78.6  PLT 278   CBG:  Recent Labs Lab 02/04/16 1809 02/04/16 2158 02/05/16 0832  GLUCAP 77 151* 72   Urine Drug Screen: Drugs of Abuse     Component Value Date/Time   LABOPIA NEG 07/02/2015 1231   LABOPIA NONE DETECTED 11/08/2014 1849   COCAINSCRNUR NEG 07/02/2015 1231   COCAINSCRNUR NONE DETECTED 11/08/2014 1849   LABBENZ PPS 07/02/2015 1231   LABBENZ POSITIVE* 11/08/2014 1849   AMPHETMU NEG 07/02/2015 1231   AMPHETMU NONE DETECTED 11/08/2014 1849   THCU NEG 07/02/2015 1231   THCU NONE DETECTED 11/08/2014 1849   LABBARB NEG 07/02/2015 1231   LABBARB NONE DETECTED 11/08/2014 1849    Urinalysis:  Recent Labs Lab 02/04/16 1630  COLORURINE YELLOW  LABSPEC 1.013  PHURINE 5.5  GLUCOSEU NEGATIVE  HGBUR NEGATIVE  BILIRUBINUR NEGATIVE  KETONESUR NEGATIVE  PROTEINUR NEGATIVE  NITRITE NEGATIVE  LEUKOCYTESUR NEGATIVE   Micro Results: No results found for this or any previous visit (from the past 240 hour(s)). Studies/Results: Dg Hip Unilat With Pelvis 2-3 Views Right  02/04/2016  CLINICAL DATA:  65 year old female who fell 2 months ago with hip pain. Initial encounter. EXAM: DG HIP (WITH OR WITHOUT PELVIS) 2-3V RIGHT COMPARISON:  CT Abdomen and Pelvis 12/21/2014 FINDINGS: Femoral heads normally located. Hip joint spaces appear stable, symmetric, and within normal limits for age. Incidental calcified flank injection granulomas. Pelvis intact. SI joints within normal limits. Sacral ale appear intact. Proximal right femur intact. Grossly intact proximal left femur. Multiple small pelvic phleboliths re- demonstrated. *INSERT* bowel gas to IMPRESSION: No acute osseous abnormality identified about the right hip or pelvis. Normal for age hip joint spaces. Electronically  Signed   By: Odessa FlemingH  Hall M.D.   On: 02/04/2016 14:23   Medications: I have reviewed the patient's current medications. Scheduled Meds: . heparin  5,000 Units Subcutaneous 3 times per day  . insulin aspart  0-9 Units Subcutaneous TID WC  . insulin glargine  30 Units Subcutaneous QHS  . metoprolol succinate  25 mg Oral q morning - 10a  . pantoprazole  40 mg Oral Daily  . senna  5 tablet Oral Daily  . sertraline  150 mg Oral QHS   Continuous Infusions:  PRN Meds:.acetaminophen, clonazePAM, ondansetron **OR** ondansetron (ZOFRAN) IV Assessment/Plan: Principal Problem:   AKI (acute kidney injury) (HCC) Active Problems:   Controlled type 2 diabetes mellitus with insulin therapy (HCC)   Essential hypertension   Coronary atherosclerosis   GERD   Iron deficiency anemia   Abdominal pain, epigastric   CKD (chronic kidney disease) stage 3, GFR 30-59 ml/min   Diastolic CHF (HCC)   Hyperkalemia  AKI on CKD stage III Likely pre-renal in etiology. SCr 2.7 on admission, was 1.93 on 01/30/16. Her baseline SCr is approximately 1.5. BUN was 18 on 01/30/16 and 38 on admission. Cr down to 2.09 and BUN down to 33 today. Urine Na 20 and urine Cr 162.  -Patient is tolerating PO intake well and SCr is expected to improve  -Ambulate patient, if symptomatic, check orthostatic vitals  -She will likely be discharged today. Will request PCP to re-check BMET in 4-5 days.   Hyperkalemia (resolved) Outpatient labs from 01/30/16 showing K 5.5. K was 5.6 on admission. Likely in the setting of CKD. K normal 4.2 today.   Abdominal pain and nausea Likely due to constipation and/ or adhesions from prior abdominal surgeries. No nausea or vomiting today. Abdominal pain is chronic but back down to her baseline. She is tolerating PO intake well.  -Needs to follow-up with her gastroenterologist at [facility] as outpatient  -Zofran 4 mg q6 prn nausea  -Tylenol 650 mg q6 prn   DM2 A1c 7.7 on 01/26/16.  -SSI -Lantus 30 u QHS -CBG  monitoring   Chronic headaches MRI brain done 12/11/15 did not show any acute abnormality.  -Tylenol 650 mg q6 prn   CAD -Metoprolol 25 mg daily   Depression -Sertraline 150 mg daily   Anxiety -Clonazepam 1 mg BID prn   Diet: carb modified   DVT/PE ppx: Subcutaneous Heparin   Dispo: Patient is stable and will likely be discharged today.   The patient does have a current PCP Denton Brick([name] M Truong, MD) and does need an [facility] follow-up appointment after discharge.  The patient does not have transportation limitations that hinder transportation to clinic appointments.  .Services Needed at time of discharge: Y = Yes, Blank = No PT:   OT:   RN:   Equipment:   Other:       John GiovanniVasundhra Hernando Reali, MD 02/05/2016, 11:00 AM

## 2016-02-05 NOTE — Care Management Obs Status (Signed)
MEDICARE OBSERVATION STATUS NOTIFICATION   Patient Details  Name: [name] MRN: [id number] Date of Birth: August 10, 1951   Medicare Observation Status Notification Given:  Yes    CrutchfieldDerrill Memo, Shaye Elling M, RN 02/05/2016, 12:09 PM

## 2016-02-05 NOTE — Progress Notes (Signed)
  Date: 02/05/2016  Patient name: [name]  Medical record number: [id number]  Date of birth: 01/28/1951   I have seen and evaluated [name] and discussed their care with the Residency Team. Ms [name] came to ED for ABD pain but was admitted for Acute on chronic renal failure and hyrkalemia. This AM, her ABD pain is at baseline - chronic but no different than usual. She is able to tolerate a full diet. She sees Dr Matthias HughsBuccini and [facility]  Her Acute on chronic RF and hyperkalemia was felt to be 2/2 vol contraction 2/2 decreased PO intake along with her ARB. Her K is now nl and her Cr is trending down.  Filed Vitals:   02/04/16 2133 02/05/16 0536  BP: 123/67 131/68  Pulse: 64 62  Temp: 99.1 F (37.3 C) 98.3 F (36.8 C)  Resp: 18 17   Gen NAD HRRR LCTAB ABD + bS, soft, NT Ext no edema  Assessment and Plan: I have seen and evaluated the patient as outlined above. I agree with the formulated Assessment and Plan as detailed in the residents' note, with the following changes:   1. Chronic abd pain - this is at baseline and needs outpt F/U.  2. Acute on Chronic renal failure - resolving. Encourage PO intake. Will need outpt BMP  3. Hyperkalemia - 2/2 renal failure - resolved. Will resumption of ARB, will need outpt BMP  Stable for D/C home  Burns SpainElizabeth A Angelina Venard, MD 4/13/20174:17 PM

## 2016-02-05 NOTE — Progress Notes (Signed)
Patient asking for a stool softener and dietition consult for gastroparesis. MD paged.

## 2016-02-05 NOTE — Discharge Summary (Signed)
Name: [name] MRN: [id number] DOB: March 26, 1951 65 y.o. PCP: Denton Brick, MD  Date of Admission: 02/04/2016 12:09 PM Date of Discharge: 02/05/2016 Attending Physician: Burns Spain, MD  Discharge Diagnosis: Principal Problem:   AKI (acute kidney injury) [facility]) Active Problems:   Controlled type 2 diabetes mellitus with insulin therapy (HCC)   Essential hypertension   Coronary atherosclerosis   GERD   Iron deficiency anemia   Abdominal pain, epigastric   CKD (chronic kidney disease) stage 3, GFR 30-59 ml/min   Diastolic CHF (HCC)   Hyperkalemia  Discharge Medications:   Medication List    TAKE these medications        ACCU-CHEK AVIVA PLUS test strip  Generic drug:  glucose blood  TEST UPTO 4 TIMES DAILY     ACCU-CHEK FASTCLIX LANCETS Misc  TEST five times a day     albuterol 108 (90 Base) MCG/ACT inhaler  Commonly known as:  PROVENTIL HFA;VENTOLIN HFA  Inhale 2 puffs into the lungs every 6 (six) hours as needed for wheezing or shortness of breath.     aspirin 81 MG chewable tablet  Chew 81 mg by mouth daily.     buPROPion 150 MG 24 hr tablet  Commonly known as:  WELLBUTRIN XL  Take 3 tablets (450 mg total) by mouth daily.     clonazePAM 1 MG tablet  Commonly known as:  KLONOPIN  Take 1 tablet (1 mg total) by mouth 3 (three) times daily as needed (morning, evening, bedtime). 1 tid     cloNIDine 0.3 mg/24hr patch  Commonly known as:  CATAPRES - Dosed in mg/24 hr  Place 1 patch (0.3 mg total) onto the skin every 7 (seven) days.     dexlansoprazole 60 MG capsule  Commonly known as:  DEXILANT  Take 1 capsule (60 mg total) by mouth daily. For reflux     diclofenac sodium 1 % Gel  Commonly known as:  VOLTAREN  Apply 2 g topically 4 (four) times daily.     Insulin Glargine 100 UNIT/ML Solostar Pen  Commonly known as:  LANTUS SOLOSTAR  Inject 51 Units into the skin at bedtime.     Insulin Pen Needle 32G X 4 MM Misc  Commonly known as:  BD PEN  NEEDLE NANO U/F  Use to inject insulin into the skin     isosorbide mononitrate 60 MG 24 hr tablet  Commonly known as:  IMDUR  Take 60 mg by mouth daily.     losartan 100 MG tablet  Commonly known as:  COZAAR  Take 1 tablet (100 mg total) by mouth daily.     metoprolol succinate 25 MG 24 hr tablet  Commonly known as:  TOPROL-XL  Take 1 tablet (25 mg total) by mouth every morning.     ondansetron 4 MG tablet  Commonly known as:  ZOFRAN  Take 4 mg by mouth every 8 (eight) hours as needed for nausea.     rosuvastatin 10 MG tablet  Commonly known as:  CRESTOR  Take 1 tablet (10 mg total) by mouth at bedtime.     senna 8.6 MG tablet  Commonly known as:  SENOKOT  Take 5 tablets by mouth daily.     sertraline 100 MG tablet  Commonly known as:  ZOLOFT  Take 1.5 tablets (150 mg total) by mouth at bedtime. 1 and a half qam     simethicone 80 MG chewable tablet  Commonly known as:  GAS-X  Chew 1  tablet (80 mg total) by mouth 4 (four) times daily as needed for flatulence.     sitaGLIPtin 50 MG tablet  Commonly known as:  JANUVIA  Take 1 tablet (50 mg total) by mouth daily.     traZODone 50 MG tablet  Commonly known as:  DESYREL  Take 1 tablet (50 mg total) by mouth at bedtime.        Disposition and follow-up:   [name].  At the hospital follow up visit please address:  1.  AKI on CKD stage III Chronic abdominal pain and nausea   2.  Labs / imaging needed at time of follow-up: BMET  3.  Pending labs/ test needing follow-up:   Follow-up Appointments:     Follow-up Information    Follow up with Gara Kronerruong, [name], MD. Schedule an appointment as soon as possible for a visit in 1 week.   Specialty:  Internal Medicine   Why:  Follow up   Contact information:   [address] [place] [address] [phone]       Follow up with Redgie GrayerFINA,MICHAEL, MD. Schedule an appointment as soon as possible for a  visit in 1 week.   Specialty:  Internal Medicine   Why:  Follow up   Contact information:   [address] [place] [address] [phone]       Consultations:    Procedures Performed:  Dg Hip Unilat With Pelvis 2-3 Views Right  02/04/2016  CLINICAL DATA:  65 year old female who fell 2 months ago with hip pain. Initial encounter. EXAM: DG HIP (WITH OR WITHOUT PELVIS) 2-3V RIGHT COMPARISON:  CT Abdomen and Pelvis 12/21/2014 FINDINGS: Femoral heads normally located. Hip joint spaces appear stable, symmetric, and within normal limits for age. Incidental calcified flank injection granulomas. Pelvis intact. SI joints within normal limits. Sacral ale appear intact. Proximal right femur intact. Grossly intact proximal left femur. Multiple small pelvic phleboliths re- demonstrated. *INSERT* bowel gas to IMPRESSION: No acute osseous abnormality identified about the right hip or pelvis. Normal for age hip joint spaces. Electronically Signed   By: Odessa FlemingH  Hall M.D.   On: 02/04/2016 14:23    Admission HPI: Patient is a 65 yo F with a PMHx of diabetes, neuropathy, GERD, gastroparesis?, CAD presenting with worsening abdominal pain. Patient states her abdominal pain is chronic but has been worse for the past 3 days. The pain is located in the periumbilical region, non-radiating, it is intermittent and 8/10 in intensity. Pain is worse with eating. Reports using a heating pad and taking Tylenol but to no avail. She has been taking Zofran at home for nausea but denies any episodes of vomiting. States she has to take Zofran several times a day for her nausea. She has chronic constipation and states her last BM was this morning. Reports having adequate PO intake. Denies having any fevers, chills, diarrhea, or hematochezia. Denies having any GERD symptoms. Denies having any dysuria and or history of kidney stones. States her "urine trickles" when goes to the bathroom and this has been going on for years.  Patient also reports having right sided hip pain for the past 2 months after she fell. States the pain is worse with moving and sitting. Denies using any NSAIDs.  Records show endoscopy and colonoscopy done in 08/2008 were normal. Gastric emptying study done in 06/2015 was normal. Xray of the right hip done in the ED today did not show  any acute abnormality.  Hospital Course by problem list: Principal Problem:   AKI (acute kidney injury) (HCC) Active Problems:   Controlled type 2 diabetes mellitus with insulin therapy (HCC)   Essential hypertension   Coronary atherosclerosis   GERD   Iron deficiency anemia   Abdominal pain, epigastric   CKD (chronic kidney disease) stage 3, GFR 30-59 ml/min   Diastolic CHF (HCC)   Hyperkalemia   AKI on CKD stage III Likely pre-renal in etiology. SCr 2.73 on admission, was 1.93 on 01/30/16. Her baseline SCr is approximately 1.5. BUN was 18 on 01/30/16 and 38 on admission. Patient was nauseous but denies having any vomiting or diarrhea. Reported having adequate PO intake. However, on exam it was noted her tongue and mucous membranes were dry.She was given IV fluids and creatinine trended down. She was not nauseous anymore and tolerating PO intake well. SCr in expected to improve with better oral intake. Patient has been advised to follow-up with her PCP within the next 4-5 days. She will need a repeat BMET at that visit.   Hyperkalemia  Outpatient labs from 01/30/16 showing K 5.5 and it was 5.6 on admission. Likely in the setting of CKD. No EKG changes noted. Her ARB was held temporarily. K trended down to normal after fluid resuscitation.   Abdominal pain and nausea (chronic) Likely due to constipation and/ or adhesions from prior abdominal surgeries. She is a diabetic but gastroparesis less likely because gastric emptying study done 06/2015 was normal. Colonoscopy and endoscopy done in 2009 were normal. She denies having any GERD symptoms or hematochezia. Lipase  was normal. Chronic pancreatitis less likely because CT abdomen and pelvis done 11/2014 was showing normal pancreas. She received Zofran for nausea and Tylenol for abdominal pain. The following day her nausea resolved and patient was tolerating PO intake well. Patient has been advised to follow-up with her gastroenterologist in [place] as soon as possible.   Discharge Vitals:   BP 131/68 mmHg  Pulse 62  Temp(Src) 98.3 F (36.8 C) (Oral)  Resp 17  Ht 5\' 5"  (1.651 m)  Wt 87 kg (191 lb 12.8 oz)  BMI 31.92 kg/m2  SpO2 99%  Discharge Labs:  Results for orders placed or performed during the hospital encounter of 02/04/16 (from the past 24 hour(s))  Urinalysis, Routine w reflex microscopic (not at [facility])     Status: None   Collection Time: 02/04/16  4:30 PM  Result Value Ref Range   Color, Urine YELLOW YELLOW   APPearance CLEAR CLEAR   Specific Gravity, Urine 1.013 1.005 - 1.030   pH 5.5 5.0 - 8.0   Glucose, UA NEGATIVE NEGATIVE mg/dL   Hgb urine dipstick NEGATIVE NEGATIVE   Bilirubin Urine NEGATIVE NEGATIVE   Ketones, ur NEGATIVE NEGATIVE mg/dL   Protein, ur NEGATIVE NEGATIVE mg/dL   Nitrite NEGATIVE NEGATIVE   Leukocytes, UA NEGATIVE NEGATIVE  CBG monitoring, ED     Status: None   Collection Time: 02/04/16  6:09 PM  Result Value Ref Range   Glucose-Capillary 77 65 - 99 mg/dL  Sodium, urine, random     Status: None   Collection Time: 02/04/16  6:24 PM  Result Value Ref Range   Sodium, Ur 20 mmol/L  Creatinine, urine, random     Status: None   Collection Time: 02/04/16  6:24 PM  Result Value Ref Range   Creatinine, Urine 162.73 mg/dL  Basic metabolic panel     Status: Abnormal   Collection Time: 02/04/16  8:52  PM  Result Value Ref Range   Sodium 138 135 - 145 mmol/L   Potassium 5.1 3.5 - 5.1 mmol/L   Chloride 104 101 - 111 mmol/L   CO2 22 22 - 32 mmol/L   Glucose, Bld 96 65 - 99 mg/dL   BUN 35 (H) 6 - 20 mg/dL   Creatinine, Ser 1.61 (H) 0.44 - 1.00 mg/dL   Calcium 9.2  8.9 - 09.6 mg/dL   GFR calc non Af Amer 20 (L) >60 mL/min   GFR calc Af Amer 23 (L) >60 mL/min   Anion gap 12 5 - 15  Glucose, capillary     Status: Abnormal   Collection Time: 02/04/16  9:58 PM  Result Value Ref Range   Glucose-Capillary 151 (H) 65 - 99 mg/dL   Comment 1 Notify RN   Basic metabolic panel     Status: Abnormal   Collection Time: 02/05/16  5:07 AM  Result Value Ref Range   Sodium 141 135 - 145 mmol/L   Potassium 4.2 3.5 - 5.1 mmol/L   Chloride 109 101 - 111 mmol/L   CO2 23 22 - 32 mmol/L   Glucose, Bld 83 65 - 99 mg/dL   BUN 33 (H) 6 - 20 mg/dL   Creatinine, Ser 0.45 (H) 0.44 - 1.00 mg/dL   Calcium 9.0 8.9 - 40.9 mg/dL   GFR calc non Af Amer 24 (L) >60 mL/min   GFR calc Af Amer 27 (L) >60 mL/min   Anion gap 9 5 - 15  Cortisol     Status: None   Collection Time: 02/05/16  5:07 AM  Result Value Ref Range   Cortisol, Plasma 10.0 ug/dL  Glucose, capillary     Status: None   Collection Time: 02/05/16  8:32 AM  Result Value Ref Range   Glucose-Capillary 72 65 - 99 mg/dL   *Note: Due to a large number of results and/or encounters for the requested time period, some results have not been displayed. A complete set of results can be found in Results Review.    Signed: John Giovanni, MD 02/05/2016, 11:35 AM    Services Ordered on Discharge:  Equipment Ordered on Discharge:

## 2016-02-10 DIAGNOSIS — Z881 Allergy status to other antibiotic agents status: Secondary | ICD-10-CM | POA: Diagnosis not present

## 2016-02-10 DIAGNOSIS — R110 Nausea: Secondary | ICD-10-CM | POA: Insufficient documentation

## 2016-02-10 DIAGNOSIS — R1013 Epigastric pain: Secondary | ICD-10-CM | POA: Diagnosis not present

## 2016-02-10 DIAGNOSIS — Z87891 Personal history of nicotine dependence: Secondary | ICD-10-CM | POA: Diagnosis not present

## 2016-02-10 DIAGNOSIS — E119 Type 2 diabetes mellitus without complications: Secondary | ICD-10-CM | POA: Diagnosis not present

## 2016-02-10 DIAGNOSIS — Z7982 Long term (current) use of aspirin: Secondary | ICD-10-CM | POA: Diagnosis not present

## 2016-02-10 DIAGNOSIS — Z882 Allergy status to sulfonamides status: Secondary | ICD-10-CM | POA: Diagnosis not present

## 2016-02-10 DIAGNOSIS — Z888 Allergy status to other drugs, medicaments and biological substances status: Secondary | ICD-10-CM | POA: Diagnosis not present

## 2016-02-10 DIAGNOSIS — I10 Essential (primary) hypertension: Secondary | ICD-10-CM | POA: Diagnosis not present

## 2016-02-10 DIAGNOSIS — Z794 Long term (current) use of insulin: Secondary | ICD-10-CM | POA: Diagnosis not present

## 2016-02-10 DIAGNOSIS — R197 Diarrhea, unspecified: Secondary | ICD-10-CM | POA: Insufficient documentation

## 2016-02-10 DIAGNOSIS — Z79899 Other long term (current) drug therapy: Secondary | ICD-10-CM | POA: Diagnosis not present

## 2016-02-10 DIAGNOSIS — Z880 Allergy status to penicillin: Secondary | ICD-10-CM | POA: Diagnosis not present

## 2016-02-10 DIAGNOSIS — K219 Gastro-esophageal reflux disease without esophagitis: Secondary | ICD-10-CM | POA: Diagnosis not present

## 2016-02-10 DIAGNOSIS — K5909 Other constipation: Secondary | ICD-10-CM | POA: Diagnosis not present

## 2016-02-13 ENCOUNTER — Ambulatory Visit: Payer: Self-pay | Admitting: Internal Medicine

## 2016-02-18 ENCOUNTER — Encounter: Payer: Self-pay | Admitting: Internal Medicine

## 2016-02-18 ENCOUNTER — Ambulatory Visit (INDEPENDENT_AMBULATORY_CARE_PROVIDER_SITE_OTHER): Payer: Medicare Other | Admitting: Internal Medicine

## 2016-02-18 VITALS — BP 116/60 | HR 81 | Temp 98.2°F | Ht 65.0 in | Wt 190.1 lb

## 2016-02-18 DIAGNOSIS — Z09 Encounter for follow-up examination after completed treatment for conditions other than malignant neoplasm: Secondary | ICD-10-CM

## 2016-02-18 DIAGNOSIS — Z794 Long term (current) use of insulin: Secondary | ICD-10-CM

## 2016-02-18 DIAGNOSIS — Z87448 Personal history of other diseases of urinary system: Secondary | ICD-10-CM | POA: Diagnosis not present

## 2016-02-18 DIAGNOSIS — E119 Type 2 diabetes mellitus without complications: Secondary | ICD-10-CM | POA: Diagnosis not present

## 2016-02-18 DIAGNOSIS — N179 Acute kidney failure, unspecified: Secondary | ICD-10-CM

## 2016-02-18 LAB — GLUCOSE, CAPILLARY: GLUCOSE-CAPILLARY: 164 mg/dL — AB (ref 65–99)

## 2016-02-18 NOTE — Assessment & Plan Note (Signed)
Pt here for f/u from hospital admission for AKI when her Creatinine was found to be elevated at 2.73 from b/l of 1.6-1.9. Creatinine improved to 2.09 with IVFs. She is eating and tolerating fluids well. Denies diarrhea, vomiting, or dysuria. Likely her creatinine has continued to trend down to baseline, no sx to suggest checking a BMET today.  - CTM

## 2016-02-18 NOTE — Assessment & Plan Note (Addendum)
A1c 7.7 earlier this month on 4/3. She brought in glucometer report with only 3 readings ranging from 122-220. Advised to check CBGs more often and will follow up a1c in 2 months. No changes made to DM regimen today.

## 2016-02-18 NOTE — Progress Notes (Signed)
Case discussed with Dr. Truong at the time of the visit.  We reviewed the resident's history and exam and pertinent patient test results.  I agree with the assessment, diagnosis, and plan of care documented in the resident's note. 

## 2016-02-18 NOTE — Progress Notes (Signed)
Subjective:   Patient ID: [name] female   DOB: [birth year] 65 y.o.   MRN: [id number]  HPI: [name] is a 65 y.o. with past medical history as outlined below who presents to clinic for hospital f/u. She was admitted 4/12-13 for an AKI. Creatinine on d/c from hospital was 2.09 and her b/l creatinine is 1.6-1.9. She states she is eating and drinking well. Denies dysuria, diarrhea, vomiting.  Please see problem list for status of the pt's chronic medical problems.  Past Medical History  Diagnosis Date  . Hypertension   . Peripheral neuropathy (HCC)   . Cervicalgia   . Low back pain syndrome   . Hyperlipidemia   . Vitamin D deficiency   . Vitamin B12 deficiency   . Iliotibial band syndrome   . Anemia   . Borderline personality disorder   . Nonorganic psychosis   . GERD (gastroesophageal reflux disease)   . Diabetes mellitus   . Depression   . Diabetic gastroparesis (HCC)   . CAD (coronary artery disease)     Catherization 11/18/09>nonobstructive CAD , normal LV function, EF 65%  . Health maintenance examination     Colonoscopy 2009> normal; Diabetic eye exam 12/2008. No diabetic retinopathy; Mammogram 11/10> No evidence of malignancy, DXA 03/14/13 : normal  . Headache(784.0)   . Arthritis   . SBO (small bowel obstruction) (HCC) 01/09/2013  . Diastolic CHF (HCC)     Grade I, on Echo 06/2013, EF 55-60%  . Sleep apnea     does not wear CPAP   . Diabetes mellitus, type II (HCC)   . Wears glasses   . Wears dentures     upper  . Gout   . Right carpal tunnel syndrome 09/13/2014   Current Outpatient Prescriptions  Medication Sig Dispense Refill  . ACCU-CHEK AVIVA PLUS test strip TEST UPTO 4 TIMES DAILY 125 each 5  . ACCU-CHEK FASTCLIX LANCETS MISC TEST five times a day 102 each 11  . albuterol (PROVENTIL HFA;VENTOLIN HFA) 108 (90 BASE) MCG/ACT inhaler Inhale 2 puffs into the lungs every 6 (six) hours as needed for wheezing or shortness of breath. 1 Inhaler 2  . aspirin 81  MG chewable tablet Chew 81 mg by mouth daily.    Marland Kitchen. buPROPion (WELLBUTRIN XL) 150 MG 24 hr tablet Take 3 tablets (450 mg total) by mouth daily. 270 tablet 1  . clonazePAM (KLONOPIN) 1 MG tablet Take 1 tablet (1 mg total) by mouth 3 (three) times daily as needed (morning, evening, bedtime). 1 tid (Patient taking differently: Take 1 mg by mouth 2 (two) times daily. ) 90 tablet 3  . cloNIDine (CATAPRES - DOSED IN MG/24 HR) 0.3 mg/24hr patch Place 1 patch (0.3 mg total) onto the skin every 7 (seven) days. (Patient taking differently: Place 0.3 mg onto the skin every 7 (seven) days. Thursday) 4 patch 11  . dexlansoprazole (DEXILANT) 60 MG capsule Take 1 capsule (60 mg total) by mouth daily. For reflux 30 capsule   . diclofenac sodium (VOLTAREN) 1 % GEL Apply 2 g topically 4 (four) times daily. 3 Tube 1  . Insulin Glargine (LANTUS SOLOSTAR) 100 UNIT/ML Solostar Pen Inject 51 Units into the skin at bedtime. 45 mL 4  . Insulin Pen Needle (BD PEN NEEDLE NANO U/F) 32G X 4 MM MISC Use to inject insulin into the skin 100 each 6  . isosorbide mononitrate (IMDUR) 60 MG 24 hr tablet Take 60 mg by mouth daily.  0  . losartan (COZAAR)  100 MG tablet Take 1 tablet (100 mg total) by mouth daily. 90 tablet 4  . metoprolol succinate (TOPROL-XL) 25 MG 24 hr tablet Take 1 tablet (25 mg total) by mouth every morning. 90 tablet 4  . ondansetron (ZOFRAN) 4 MG tablet Take 4 mg by mouth every 8 (eight) hours as needed for nausea.     . rosuvastatin (CRESTOR) 10 MG tablet Take 1 tablet (10 mg total) by mouth at bedtime. 30 tablet 11  . senna (SENOKOT) 8.6 MG tablet Take 5 tablets by mouth daily.    . sertraline (ZOLOFT) 100 MG tablet Take 1.5 tablets (150 mg total) by mouth at bedtime. 1 and a half qam 135 tablet 1  . simethicone (GAS-X) 80 MG chewable tablet Chew 1 tablet (80 mg total) by mouth 4 (four) times daily as needed for flatulence. 100 tablet 2  . sitaGLIPtin (JANUVIA) 50 MG tablet Take 1 tablet (50 mg total) by mouth  daily. 90 tablet 4  . traZODone (DESYREL) 50 MG tablet Take 1 tablet (50 mg total) by mouth at bedtime. (Patient not taking: Reported on 01/01/2016) 90 tablet 1   No current facility-administered medications for this visit.   Family History  Problem Relation Age of Onset  . Diabetes Mother   . Hypertension Mother   . Hyperlipidemia Mother   . Arthritis Mother   . Depression Mother   . Heart disease Father   . Diabetes Brother   . Heart disease Brother   . Heart disease Paternal Grandmother   . Diabetes Brother    Social History   Social History  . Marital Status: Married    Spouse Name: N/A  . Number of Children: N/A  . Years of Education: N/A   Social History Main Topics  . Smoking status: Former Smoker    Types: Cigarettes    Quit date: 10/25/2002  . Smokeless tobacco: Never Used  . Alcohol Use: No  . Drug Use: No  . Sexual Activity: Not Currently   Other Topics Concern  . None   Social History Narrative   Review of Systems: Review of Systems  Respiratory: Negative for shortness of breath.   Cardiovascular: Negative for chest pain.  Gastrointestinal: Positive for nausea (chronic). Negative for vomiting and diarrhea.  Genitourinary: Negative for dysuria.    Objective:  Physical Exam: Filed Vitals:   02/18/16 1021  BP: 116/60  Pulse: 81  Temp: 98.2 F (36.8 C)  TempSrc: Oral  Height:  (1.651 m)  Weight: 190 lb 1.6 oz (86.229 kg)  SpO2: 99%   Physical Exam  Constitutional: She appears well-developed and well-nourished. No distress.  HENT:  Head: Normocephalic and atraumatic.  Nose: Nose normal.  Cardiovascular: Normal rate, regular rhythm and normal heart sounds.  Exam reveals no gallop and no friction rub.   No murmur heard. Pulmonary/Chest: Effort normal and breath sounds normal. No respiratory distress. She has no wheezes. She has no rales.  Abdominal: Soft. Bowel sounds are normal. She exhibits no distension. There is no rebound and no  guarding.  Skin: Skin is warm and dry. No rash noted. She is not diaphoretic. No erythema. No pallor.    Assessment & Plan:   Please see problem based assessment and plan.

## 2016-02-23 ENCOUNTER — Ambulatory Visit (HOSPITAL_COMMUNITY): Payer: Self-pay | Admitting: Clinical

## 2016-02-23 DIAGNOSIS — K760 Fatty (change of) liver, not elsewhere classified: Secondary | ICD-10-CM | POA: Diagnosis not present

## 2016-02-23 DIAGNOSIS — N2889 Other specified disorders of kidney and ureter: Secondary | ICD-10-CM | POA: Diagnosis not present

## 2016-02-23 DIAGNOSIS — R110 Nausea: Secondary | ICD-10-CM | POA: Diagnosis not present

## 2016-02-23 DIAGNOSIS — R1013 Epigastric pain: Secondary | ICD-10-CM | POA: Diagnosis not present

## 2016-03-03 ENCOUNTER — Ambulatory Visit (HOSPITAL_COMMUNITY): Payer: Self-pay | Admitting: Psychiatry

## 2016-03-04 ENCOUNTER — Ambulatory Visit (INDEPENDENT_AMBULATORY_CARE_PROVIDER_SITE_OTHER): Payer: 59 | Admitting: Psychiatry

## 2016-03-04 VITALS — BP 140/80 | HR 92 | Ht 65.0 in | Wt 189.6 lb

## 2016-03-04 DIAGNOSIS — F3341 Major depressive disorder, recurrent, in partial remission: Secondary | ICD-10-CM

## 2016-03-04 DIAGNOSIS — F331 Major depressive disorder, recurrent, moderate: Secondary | ICD-10-CM

## 2016-03-04 DIAGNOSIS — F329 Major depressive disorder, single episode, unspecified: Secondary | ICD-10-CM

## 2016-03-04 DIAGNOSIS — F32A Depression, unspecified: Secondary | ICD-10-CM

## 2016-03-04 MED ORDER — SERTRALINE HCL 100 MG PO TABS: ORAL_TABLET | ORAL | Status: DC

## 2016-03-04 MED ORDER — BUPROPION HCL ER (XL) 150 MG PO TB24: 450.0000 mg | ORAL_TABLET | Freq: Every day | ORAL | Status: DC

## 2016-03-04 MED ORDER — CLONAZEPAM 0.5 MG PO TABS
ORAL_TABLET | ORAL | Status: DC
Start: 2016-03-04 — End: 2016-08-25

## 2016-03-04 NOTE — Progress Notes (Signed)
Patient ID: [name], female   DOB: August 10, 1951, 65 y.o.   MRN: [id number] [facility] MD Progress Note  03/04/2016 3:54 PM [name]  MRN:  [id number] Subjective:   Better  Happy Principal Problem: major depression , recurrent , mild Diagnosis:   Major depression, recurrent, mild  Today the patient has had a decline. She feels more energy illness. She feels sad her. She hasn't seen a therapist in a while but she has an appointment coming up. Her sleep is up and down. Her appetite is okay. She still stays active. She's having problems with her energy level. She has difficulty concentrating. The patient doesn't drink any alcohol use any drugs. She is no evidence of psychosis. Physically she feels well. Her mood is just decline. Is not clear what the causes but I think at this time we'll probably start reducing her Klonopin and hopefully that is been contributing to a lack of energy. She denies any anxiety at all. She certainly is not suicidal. The patient denies any neurological symptoms at this time. He denies any chest pain or shortness of breath. Total Time spent with patient: 30 minutes  Past Psychiatric History:   Past Medical History:  Past Medical History  Diagnosis Date  . Hypertension   . Peripheral neuropathy (HCC)   . Cervicalgia   . Low back pain syndrome   . Hyperlipidemia   . Vitamin D deficiency   . Vitamin B12 deficiency   . Iliotibial band syndrome   . Anemia   . Borderline personality disorder   . Nonorganic psychosis   . GERD (gastroesophageal reflux disease)   . Diabetes mellitus   . Depression   . Diabetic gastroparesis (HCC)   . CAD (coronary artery disease)     Catherization 11/18/09>nonobstructive CAD , normal LV function, EF 65%  . Health maintenance examination     Colonoscopy 2009> normal; Diabetic eye exam 12/2008. No diabetic retinopathy; Mammogram 11/10> No evidence of malignancy, DXA 03/14/13 : normal  . Headache(784.0)   . Arthritis   . SBO (small bowel  obstruction) (HCC) 01/09/2013  . Diastolic CHF (HCC)     Grade I, on Echo 06/2013, EF 55-60%  . Sleep apnea     does not wear CPAP   . Diabetes mellitus, type II (HCC)   . Wears glasses   . Wears dentures     upper  . Gout   . Right carpal tunnel syndrome 09/13/2014    Past Surgical History  Procedure Laterality Date  . Abdominal hysterectomy    . Hand surgery  1992    rt  . Bowel resection N/A 01/10/2013    Procedure: SMALL BOWEL RESECTION;  Surgeon: Lodema Pilot, DO;  Location: [facility] OR;  Service: General;  Laterality: N/A;  . Lysis of adhesion N/A 01/10/2013    Procedure: LYSIS OF ADHESION;  Surgeon: Lodema Pilot, DO;  Location: [facility] OR;  Service: General;  Laterality: N/A;  . Laparotomy N/A 01/10/2013    Procedure: EXPLORATORY LAPAROTOMY;  Surgeon: Lodema Pilot, DO;  Location: [facility] OR;  Service: General;  Laterality: N/A;  . Incisional hernia repair N/A 03/01/2014    Procedure: LAPAROSCOPIC INCISIONAL HERNIA;  Surgeon: Axel Filler, MD;  Location: [facility];  Service: General;  Laterality: N/A;  . Insertion of mesh N/A 03/01/2014    Procedure: INSERTION OF MESH;  Surgeon: Axel Filler, MD;  Location: [facility];  Service: General;  Laterality: N/A;  . Hernia repair  03/01/14    Lap incisional hernia repair w/mesh  .  Appendectomy    . Carpal tunnel release Right 09/13/2014    Procedure: RIGHT WRIST CARPAL TUNNEL RELEASE;  Surgeon: Eulas PostJoshua P Landau, MD;  Location: [facility];  Service: Orthopedics;  Laterality: Right;  . Steriod injection Left 09/13/2014    Procedure: STEROID INJECTION LEFT HAND;  Surgeon: Eulas PostJoshua P Landau, MD;  Location: [facility];  Service: Orthopedics;  Laterality: Left;  . Carpal tunnel release Left 02/28/2015    Procedure: LEFT CARPAL TUNNEL RELEASE;  Surgeon: Teryl LucyJoshua Landau, MD;  Location: [facility];  Service: Orthopedics;  Laterality: Left;   Family History:  Family History  Problem Relation Age of Onset  . Diabetes Mother   .  Hypertension Mother   . Hyperlipidemia Mother   . Arthritis Mother   . Depression Mother   . Heart disease Father   . Diabetes Brother   . Heart disease Brother   . Heart disease Paternal Grandmother   . Diabetes Brother    Family Psychiatric  History:  Social History:  History  Alcohol Use No     History  Drug Use No    Social History   Social History  . Marital Status: Married    Spouse Name: N/A  . Number of Children: N/A  . Years of Education: N/A   Social History Main Topics  . Smoking status: Former Smoker    Types: Cigarettes    Quit date: 10/25/2002  . Smokeless tobacco: Never Used  . Alcohol Use: No  . Drug Use: No  . Sexual Activity: Not Currently   Other Topics Concern  . Not on file   Social History Narrative   Additional Social History:                         Sleep: Good  Appetite:  Good  Current Medications: Current Outpatient Prescriptions  Medication Sig Dispense Refill  . ACCU-CHEK AVIVA PLUS test strip TEST UPTO 4 TIMES DAILY 125 each 5  . ACCU-CHEK FASTCLIX LANCETS MISC TEST five times a day 102 each 11  . albuterol (PROVENTIL HFA;VENTOLIN HFA) 108 (90 BASE) MCG/ACT inhaler Inhale 2 puffs into the lungs every 6 (six) hours as needed for wheezing or shortness of breath. 1 Inhaler 2  . aspirin 81 MG chewable tablet Chew 81 mg by mouth daily.    Marland Kitchen. buPROPion (WELLBUTRIN XL) 150 MG 24 hr tablet Take 3 tablets (450 mg total) by mouth daily. 270 tablet 1  . clonazePAM (KLONOPIN) 0.5 MG tablet 1 bid 60 tablet 3  . cloNIDine (CATAPRES - DOSED IN MG/24 HR) 0.3 mg/24hr patch Place 1 patch (0.3 mg total) onto the skin every 7 (seven) days. (Patient taking differently: Place 0.3 mg onto the skin every 7 (seven) days. Thursday) 4 patch 11  . dexlansoprazole (DEXILANT) 60 MG capsule Take 1 capsule (60 mg total) by mouth daily. For reflux 30 capsule   . diclofenac sodium (VOLTAREN) 1 % GEL Apply 2 g topically 4 (four) times daily. 3 Tube 1  .  Insulin Glargine (LANTUS SOLOSTAR) 100 UNIT/ML Solostar Pen Inject 51 Units into the skin at bedtime. 45 mL 4  . Insulin Pen Needle (BD PEN NEEDLE NANO U/F) 32G X 4 MM MISC Use to inject insulin into the skin 100 each 6  . isosorbide mononitrate (IMDUR) 60 MG 24 hr tablet Take 60 mg by mouth daily.  0  . losartan (COZAAR) 100 MG tablet Take 1 tablet (100 mg  total) by mouth daily. 90 tablet 4  . metoprolol succinate (TOPROL-XL) 25 MG 24 hr tablet Take 1 tablet (25 mg total) by mouth every morning. 90 tablet 4  . ondansetron (ZOFRAN) 4 MG tablet Take 4 mg by mouth every 8 (eight) hours as needed for nausea.     . rosuvastatin (CRESTOR) 10 MG tablet Take 1 tablet (10 mg total) by mouth at bedtime. 30 tablet 11  . senna (SENOKOT) 8.6 MG tablet Take 5 tablets by mouth daily.    . sertraline (ZOLOFT) 100 MG tablet 2  qam 180 tablet 2  . simethicone (GAS-X) 80 MG chewable tablet Chew 1 tablet (80 mg total) by mouth 4 (four) times daily as needed for flatulence. 100 tablet 2  . sitaGLIPtin (JANUVIA) 50 MG tablet Take 1 tablet (50 mg total) by mouth daily. 90 tablet 4  . traZODone (DESYREL) 50 MG tablet Take 1 tablet (50 mg total) by mouth at bedtime. (Patient not taking: Reported on 01/01/2016) 90 tablet 1   No current facility-administered medications for this visit.    Lab Results: No results found. However, due to the size of the patient record, not all encounters were searched. Please check Results Review for a complete set of results.  Physical Findings: AIMS:  , ,  ,  ,    CIWA:    COWS:     Musculoskeletal: Strength & Muscle Tone: within normal limits Gait & Station: normal Patient leans: N/A  Psychiatric Specialty Exam: ROS  Blood pressure 140/80, pulse 92, height  (1.651 m), weight 189 lb 9.6 oz (86.002 kg).Body mass index is 31.55 kg/(m^2).  General Appearance: Casual  Eye Contact::  Good  Speech:  Clear and Coherent  Volume:  Normal  Mood:  Euthymic  Affect:  Congruent   Thought Process:  Coherent  Orientation:  Full (Time, Place, and Person)  Thought Content:  WDL  Suicidal Thoughts:  No  Homicidal Thoughts:  No  Memory:  NA  Judgement:  Good  Insight:  Good  Psychomotor Activity:  Normal  Concentration:  Good  Recall:  Good  Fund of Knowledge:Good  Language: Good  Akathisia:  No  Handed:  Right  AIMS (if indicated):     Assets:  Desire for Improvement  ADL's:  Intact  Cognition: WNL  Sleep:      Treatment Plan Summary: At this time we'll go ahead and increase her Zoloft to the maximum dose of 200 mg. She'll continue taking Wellbutrin at the maximum dose of 450 mg. Today we'll begin to reduce her Klonopin. She was on 1 mg 3 a day now she takes 1 mg twice a day and today will reduce it to 0.5 mg twice a day. The patient has an appointment with her therapist in one week. The patient is not suicidal. The etiology of why she's taken a decline is not clear. She'll return to see me just about 2 months we'll explore this issue. Lucas Mallow, MD 03/04/2016, 3:54 PM

## 2016-03-12 DIAGNOSIS — E119 Type 2 diabetes mellitus without complications: Secondary | ICD-10-CM | POA: Diagnosis not present

## 2016-03-12 DIAGNOSIS — H25813 Combined forms of age-related cataract, bilateral: Secondary | ICD-10-CM | POA: Diagnosis not present

## 2016-03-12 LAB — HM DIABETES EYE EXAM

## 2016-03-15 ENCOUNTER — Encounter (HOSPITAL_COMMUNITY): Payer: Self-pay | Admitting: Clinical

## 2016-03-15 ENCOUNTER — Ambulatory Visit (INDEPENDENT_AMBULATORY_CARE_PROVIDER_SITE_OTHER): Payer: 59 | Admitting: Clinical

## 2016-03-15 DIAGNOSIS — F331 Major depressive disorder, recurrent, moderate: Secondary | ICD-10-CM | POA: Diagnosis not present

## 2016-03-15 DIAGNOSIS — F411 Generalized anxiety disorder: Secondary | ICD-10-CM | POA: Diagnosis not present

## 2016-03-15 DIAGNOSIS — F4321 Adjustment disorder with depressed mood: Secondary | ICD-10-CM | POA: Diagnosis not present

## 2016-03-15 NOTE — Progress Notes (Signed)
   THERAPIST PROGRESS NOTE  Session Time: 1:30 - 2:25  Participation Level: Active  Behavioral Response: NeatAlertDepressed  Type of Therapy: Individual Therapy  Treatment Goals addressed: Improve Psychiatric Symptoms, Calming Skills, Emotional Regulation Skills, Interpersonal Relationship Skills  Interventions: Motivational Interviewing, CBT, Grounding & Mindfulness Techniques  Summary: [name] is a 65 y.o. female who presents with Major Depressive Disorder.   Suicidal/Homicidal: No -without intent/plan  Therapist Response:  [name] met with clinician for an individual session. [name] shared about her psychiatric symptoms, her current life events and her homework. [name] shared that she got a new phone and has been keeping her gratitude journal on it. She shared that she has been experiencing a lot of depression since her last session. She shared that she has not come to session because of her depression was bad. Clinician encouraged her to come when she is depressed. Client and clinician discussed why it would be beneficial for her to attend session even when she is depressed. [name] shared that she also has been experiencing a lot of physical problems as well. She shared that her stomach problem was finally resolved. But that she is having other issues with her kidneys and her arm. She shared that she feels like she is forgetting things because she is unable to remember where she has placed things. She shares that she can't find interest in things. She is bored and missing her daughter a lot. She denied any suicidal or homicidal ideation. She shared that she has made some effort to leave the house though it's very challenging. She shared that she is having a lot of guilt a bout not seeing her mother as much. She also shared that she continues to have relationship issues with her husband. Clinician asked open ended questions and [name] identified something she could do to help improve her mood.  She shared that she could attend church more and go back to silver sli[employer]. She shared now that she is talking about it she got both would be a good idea because when she has people to talk to she feels less isolated. Client and clinician discussed the nature of depression and isolation. Client and clinician discussed the benefits of being around people and getting out of the house. [name] agreed to continue to keep her gratitude journal. She also agreed to attempt to go to [employer] sli[employer] coming Monday.   Plan: Return again in 1-2 weeks.  Diagnosis:     Axis I: Major Depressive Disorder   [name], LCSW 03/15/2016

## 2016-03-16 ENCOUNTER — Other Ambulatory Visit: Payer: Self-pay | Admitting: Internal Medicine

## 2016-03-17 DIAGNOSIS — R93429 Abnormal radiologic findings on diagnostic imaging of unspecified kidney: Secondary | ICD-10-CM | POA: Diagnosis not present

## 2016-03-17 DIAGNOSIS — N189 Chronic kidney disease, unspecified: Secondary | ICD-10-CM | POA: Diagnosis not present

## 2016-03-29 ENCOUNTER — Encounter: Payer: Self-pay | Admitting: *Deleted

## 2016-04-05 DIAGNOSIS — H25811 Combined forms of age-related cataract, right eye: Secondary | ICD-10-CM | POA: Diagnosis not present

## 2016-04-05 DIAGNOSIS — H2511 Age-related nuclear cataract, right eye: Secondary | ICD-10-CM | POA: Diagnosis not present

## 2016-04-09 ENCOUNTER — Other Ambulatory Visit: Payer: Self-pay | Admitting: Internal Medicine

## 2016-04-12 ENCOUNTER — Encounter (HOSPITAL_COMMUNITY): Payer: Self-pay | Admitting: Clinical

## 2016-04-12 ENCOUNTER — Ambulatory Visit (INDEPENDENT_AMBULATORY_CARE_PROVIDER_SITE_OTHER): Payer: 59 | Admitting: Clinical

## 2016-04-12 DIAGNOSIS — F4321 Adjustment disorder with depressed mood: Secondary | ICD-10-CM

## 2016-04-12 DIAGNOSIS — F331 Major depressive disorder, recurrent, moderate: Secondary | ICD-10-CM

## 2016-04-12 DIAGNOSIS — F411 Generalized anxiety disorder: Secondary | ICD-10-CM
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
## 2016-04-19 DIAGNOSIS — H2512 Age-related nuclear cataract, left eye: Secondary | ICD-10-CM | POA: Diagnosis not present

## 2016-04-19 DIAGNOSIS — H268 Other specified cataract: Secondary | ICD-10-CM | POA: Diagnosis not present
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
## 2016-04-28 ENCOUNTER — Encounter: Payer: Self-pay | Admitting: Internal Medicine

## 2016-04-28 ENCOUNTER — Ambulatory Visit (INDEPENDENT_AMBULATORY_CARE_PROVIDER_SITE_OTHER): Payer: Medicare Other | Admitting: Internal Medicine

## 2016-04-28 VITALS — BP 151/71 | HR 79 | Temp 98.8°F | Ht 65.0 in | Wt 187.6 lb

## 2016-04-28 DIAGNOSIS — I10 Essential (primary) hypertension: Secondary | ICD-10-CM

## 2016-04-28 DIAGNOSIS — E119 Type 2 diabetes mellitus without complications: Secondary | ICD-10-CM

## 2016-04-28 DIAGNOSIS — Z794 Long term (current) use of insulin: Secondary | ICD-10-CM

## 2016-04-28 LAB — GLUCOSE, CAPILLARY: GLUCOSE-CAPILLARY: 432 mg/dL — AB (ref 65–99)

## 2016-04-28 LAB — POCT GLYCOSYLATED HEMOGLOBIN (HGB A1C): Hemoglobin A1C: >14

## 2016-04-29 ENCOUNTER — Telehealth: Payer: Self-pay | Admitting: Internal Medicine

## 2016-04-29 LAB — BMP8+ANION GAP
Anion Gap: 19 mmol/L — ABNORMAL HIGH (ref 10.0–18.0)
BUN / CREAT RATIO: 19 (ref 12–28)
BUN: 31 mg/dL — AB (ref 8–27)
CHLORIDE: 99 mmol/L (ref 96–106)
CO2: 17 mmol/L — ABNORMAL LOW (ref 18–29)
CREATININE: 1.61 mg/dL — AB (ref 0.57–1.00)
Calcium: 9.6 mg/dL (ref 8.7–10.3)
GFR calc non Af Amer: 33 mL/min/{1.73_m2} — ABNORMAL LOW (ref >59)
GFR, EST AFRICAN AMERICAN: 38 mL/min/{1.73_m2} — AB (ref >59)
GLUCOSE: 434 mg/dL — AB (ref 65–99)
Potassium: 5.4 mmol/L — ABNORMAL HIGH (ref 3.5–5.2)
Sodium: 135 mmol/L (ref 134–144)

## 2016-04-29 LAB — HEMOGLOBIN A1C
Est. average glucose Bld gHb Est-mCnc: 364 mg/dL
Hgb A1c MFr Bld: 14.3 % — ABNORMAL HIGH (ref 4.8–5.6)

## 2016-04-29 NOTE — Progress Notes (Signed)
Subjective:   Patient ID: [name] female   DOB: 08-12-1951 65 y.o.   MRN: [id number]  HPI: [name] is a 65 y.o. with past medical history as outlined below who presents to clinic for diabetes follow up.   Please see problem list for status of the pt's chronic medical problems.  Past Medical History  Diagnosis Date  . Hypertension   . Peripheral neuropathy (HCC)   . Cervicalgia   . Low back pain syndrome   . Hyperlipidemia   . Vitamin D deficiency   . Vitamin B12 deficiency   . Iliotibial band syndrome   . Anemia   . Borderline personality disorder   . Nonorganic psychosis   . GERD (gastroesophageal reflux disease)   . Diabetes mellitus   . Depression   . Diabetic gastroparesis (HCC)   . CAD (coronary artery disease)     Catherization 11/18/09>nonobstructive CAD , normal LV function, EF 65%  . Health maintenance examination     Colonoscopy 2009> normal; Diabetic eye exam 12/2008. No diabetic retinopathy; Mammogram 11/10> No evidence of malignancy, DXA 03/14/13 : normal  . Headache(784.0)   . Arthritis   . SBO (small bowel obstruction) (HCC) 01/09/2013  . Diastolic CHF (HCC)     Grade I, on Echo 06/2013, EF 55-60%  . Sleep apnea     does not wear CPAP   . Diabetes mellitus, type II (HCC)   . Wears glasses   . Wears dentures     upper  . Gout   . Right carpal tunnel syndrome 09/13/2014   Current Outpatient Prescriptions  Medication Sig Dispense Refill  . ACCU-CHEK AVIVA PLUS test strip TEST UPTO 4 TIMES DAILY 125 each 5  . ACCU-CHEK FASTCLIX LANCETS MISC TEST five times a day 102 each 11  . albuterol (PROVENTIL HFA;VENTOLIN HFA) 108 (90 BASE) MCG/ACT inhaler Inhale 2 puffs into the lungs every 6 (six) hours as needed for wheezing or shortness of breath. 1 Inhaler 2  . aspirin 81 MG chewable tablet Chew 81 mg by mouth daily.    Marland Kitchen. buPROPion (WELLBUTRIN XL) 150 MG 24 hr tablet Take 3 tablets (450 mg total) by mouth daily. 270 tablet 1  . clonazePAM (KLONOPIN)  0.5 MG tablet 1 bid 60 tablet 3  . cloNIDine (CATAPRES - DOSED IN MG/24 HR) 0.3 mg/24hr patch Place 1 patch (0.3 mg total) onto the skin every 7 (seven) days. (Patient taking differently: Place 0.3 mg onto the skin every 7 (seven) days. Thursday) 4 patch 11  . dexlansoprazole (DEXILANT) 60 MG capsule Take 1 capsule (60 mg total) by mouth daily. For reflux 30 capsule   . diclofenac sodium (VOLTAREN) 1 % GEL Apply 2 g topically 4 (four) times daily. 3 Tube 1  . Insulin Pen Needle (BD PEN NEEDLE NANO U/F) 32G X 4 MM MISC Use to inject insulin into the skin 100 each 6  . isosorbide mononitrate (IMDUR) 60 MG 24 hr tablet Take 60 mg by mouth daily. Reported on 03/15/2016  0  . LANTUS SOLOSTAR 100 UNIT/ML Solostar Pen INJECT 51 UNITS AT BEDTIME 30 mL 6  . losartan (COZAAR) 100 MG tablet Take 1 tablet (100 mg total) by mouth daily. 90 tablet 4  . metoprolol succinate (TOPROL-XL) 25 MG 24 hr tablet Take 1 tablet (25 mg total) by mouth every morning. 90 tablet 4  . ondansetron (ZOFRAN) 4 MG tablet Take 4 mg by mouth every 8 (eight) hours as needed for nausea. Reported on 03/15/2016    .  rosuvastatin (CRESTOR) 10 MG tablet take 1 tablet by mouth at bedtime 90 tablet 3  . senna (SENOKOT) 8.6 MG tablet Take 5 tablets by mouth daily. Reported on 03/15/2016    . sertraline (ZOLOFT) 100 MG tablet 2  qam 180 tablet 2  . sitaGLIPtin (JANUVIA) 50 MG tablet Take 1 tablet (50 mg total) by mouth daily. 90 tablet 4  . traZODone (DESYREL) 50 MG tablet Take 1 tablet (50 mg total) by mouth at bedtime. (Patient not taking: Reported on 01/01/2016) 90 tablet 1   No current facility-administered medications for this visit.   Family History  Problem Relation Age of Onset  . Diabetes Mother   . Hypertension Mother   . Hyperlipidemia Mother   . Arthritis Mother   . Depression Mother   . Heart disease Father   . Diabetes Brother   . Heart disease Brother   . Heart disease Paternal Grandmother   . Diabetes Brother    Social  History   Social History  . Marital Status: Married    Spouse Name: N/A  . Number of Children: N/A  . Years of Education: N/A   Social History Main Topics  . Smoking status: Former Smoker    Types: Cigarettes    Quit date: 10/25/2002  . Smokeless tobacco: Never Used  . Alcohol Use: No  . Drug Use: No  . Sexual Activity: Not Currently   Other Topics Concern  . None   Social History Narrative   Review of Systems: HEENT: denies blurry vision GI: chronic nausea, no vomiting GU: neg for polyuria Endocrine: neg for polydipsia Psych: depression stable, denies SI and HI  Objective:  Physical Exam: Filed Vitals:   04/28/16 1336  BP: 151/71  Pulse: 79  Temp: 98.8 F (37.1 C)  TempSrc: Oral  Height: 5\' 5"  (1.651 m)  Weight: 187 lb 9.6 oz (85.095 kg)  SpO2: 100%   General: NAD, elderly female HEENT: moist mucous membranes, EOMI, no scleral icterus Lungs: CTAB, no wheezing or crackles Cardiac: RRR, no murmurs, rubs, or gallops GI: soft, active bowel sounds, no masses Neuro: CN II-XII grossly intact Skin: warm and dry, no rashes   Assessment & Plan:  Case discussed w/ Dr. Oswaldo DoneVincent. Please see problem based assessment and plan.

## 2016-04-29 NOTE — Telephone Encounter (Signed)
Would like to talk to nurse °

## 2016-04-29 NOTE — Assessment & Plan Note (Signed)
BP Readings from Last 3 Encounters:  04/28/16 151/71  03/04/16 140/80  02/18/16 116/60    Lab Results  Component Value Date   NA 135 04/28/2016   K 5.4* 04/28/2016   CREATININE 1.61* 04/28/2016    Assessment: Blood pressure control:  mildly elevated Progress toward BP goal:   not at goal Comments: on BB, ARB, and clonidine.   Plan: Medication2s:  continue current medications Educational resources provided:   Self management tools provided:   Other plans: BMET checked today, K mildly elevated at 5.4, asymptomatic.Likely 2/2 hyperglycemia. Will CTM.

## 2016-04-29 NOTE — Assessment & Plan Note (Addendum)
Lab Results  Component Value Date   HGBA1C WILL FOLLOW 04/28/2016   HGBA1C >14.0 04/28/2016   HGBA1C 7.7 01/26/2016     Assessment: Diabetes control:  uncontrolled Progress toward A1C goal:   not at goal Comments: on lantus 51 units daily and januvia 50mg  daily. Pt is adamant that she has been taking her daily DM meds. She has not be adherent to a diabetic diet and states she is a sugarholic, she will eat cakes, sweets, and cheetos. Review of her glucometer reveals only one reading within the past 30 days of 236. She denies hypoglycemia sx, polyuria, polydipsia and blurry vision.   Plan: Medications: cont current tx, possible that she has been non compliant with her medications.  Home glucose monitoring: Frequency:  TID Timing:  w meals Instruction/counseling given: discussed diet and CBG monitoring.  Educational resources provided:   Self management tools provided:   Other plans: f/u in 1 week, pt agreeable to checking her CBGs TID until next visit. Will review report and consider starting meal time insulin coverage as well as increasing her lantus if needed. Serum hgba1c pending.

## 2016-04-30 NOTE — Telephone Encounter (Signed)
LVM for [name] to return call.

## 2016-04-30 NOTE — Progress Notes (Signed)
Internal Medicine Clinic Attending  Case discussed with Dr. Truong at the time of the visit.  We reviewed the resident's history and exam and pertinent patient test results.  I agree with the assessment, diagnosis, and plan of care documented in the resident's note.  

## 2016-05-03 ENCOUNTER — Ambulatory Visit (INDEPENDENT_AMBULATORY_CARE_PROVIDER_SITE_OTHER): Payer: 59 | Admitting: Clinical

## 2016-05-03 ENCOUNTER — Encounter (HOSPITAL_COMMUNITY): Payer: Self-pay | Admitting: Clinical

## 2016-05-03 DIAGNOSIS — F411 Generalized anxiety disorder: Secondary | ICD-10-CM | POA: Diagnosis not present

## 2016-05-03 DIAGNOSIS — F331 Major depressive disorder, recurrent, moderate: Secondary | ICD-10-CM

## 2016-05-03 DIAGNOSIS — E119 Type 2 diabetes mellitus without complications: Secondary | ICD-10-CM | POA: Diagnosis not present

## 2016-05-03 DIAGNOSIS — Z8619 Personal history of other infectious and parasitic diseases: Secondary | ICD-10-CM | POA: Diagnosis not present

## 2016-05-03 DIAGNOSIS — Z794 Long term (current) use of insulin: Secondary | ICD-10-CM | POA: Diagnosis not present

## 2016-05-03 DIAGNOSIS — R1013 Epigastric pain: Secondary | ICD-10-CM | POA: Diagnosis not present

## 2016-05-03 DIAGNOSIS — Z79899 Other long term (current) drug therapy: Secondary | ICD-10-CM | POA: Diagnosis not present

## 2016-05-03 DIAGNOSIS — K760 Fatty (change of) liver, not elsewhere classified: Secondary | ICD-10-CM | POA: Diagnosis not present

## 2016-05-03 DIAGNOSIS — R110 Nausea: Secondary | ICD-10-CM | POA: Diagnosis not present

## 2016-05-03 NOTE — Telephone Encounter (Signed)
LVM to return call.

## 2016-05-03 NOTE — Progress Notes (Signed)
   THERAPIST PROGRESS NOTE  Session Time: 2:30 -3:28  Participation Level: Active  Behavioral Response: CasualAlertDepressed Type of Therapy: Individual Therapy  Treatment Goals addressed: Improve Psychiatric Symptoms, Calming Skills, Emotional Regulation Skills, Interpersonal Relationship Skills  Interventions: Motivational Interviewing, CBT, Grounding & Mindfulness Techniques  Summary: [name] is a 65 y.o. female who presents with Major Depressive Disorder, Generalized Anxiety Disorder and Grief   Suicidal/Homicidal: No -without intent/plan  Therapist Response:  [name] met with clinician for an individual session. [name] [name] about her psychiatric symptoms, her current life events and her homework. [name] [name] that she has not been practicing her homework. [name] that she has been feeling lost "like I have no vision and no interpretation." [name] denied any suicidal or homicidal ideation. Clinician asked open ended questions and [name] [name] about the things that were increasing her depression. She [name] that she recently found out her blood sugar is off, she has been working with her doctors to get it regulated. She [name] she has been  missing her daughter who 1 year death anniversary is next month.She [name] that she has been  Isolating. Clinician asked open ended questions and [name] her thoughts about what would improve her mood. She [name] she knows that she would be less depressed if she was more social. She [name] her thoughts about why she is not social - 1) being not having anything to say because she has been isolating. Client and clinician discussed ways to interact with others when you do not have much to say. [name] that she would like to return to the [facility] sli[facility].Clinician asked open ended questions and [name] said she was 89% likely to go. [name] identified ways that doing so would improve her mood.    Plan: Return again in 1-2  weeks.  Diagnosis:     Axis I: Major Depressive Disorder, Generalized Anxiety Disorder and Grief   Nyzaiah Kai A, LCSW 05/03/2016

## 2016-05-04 ENCOUNTER — Telehealth: Payer: Self-pay | Admitting: *Deleted

## 2016-05-04 NOTE — Telephone Encounter (Signed)
rec'd a call from [facility] for update on meds/bp

## 2016-05-04 NOTE — Telephone Encounter (Signed)
LVM

## 2016-05-05 ENCOUNTER — Ambulatory Visit (INDEPENDENT_AMBULATORY_CARE_PROVIDER_SITE_OTHER): Payer: Medicare Other | Admitting: Internal Medicine

## 2016-05-05 VITALS — BP 144/62 | HR 79 | Temp 98.8°F | Ht 65.0 in | Wt 187.6 lb

## 2016-05-05 DIAGNOSIS — E1165 Type 2 diabetes mellitus with hyperglycemia: Secondary | ICD-10-CM

## 2016-05-05 DIAGNOSIS — Z794 Long term (current) use of insulin: Secondary | ICD-10-CM | POA: Diagnosis not present

## 2016-05-05 DIAGNOSIS — R300 Dysuria: Secondary | ICD-10-CM | POA: Diagnosis not present

## 2016-05-05 DIAGNOSIS — N898 Other specified noninflammatory disorders of vagina: Secondary | ICD-10-CM | POA: Diagnosis not present

## 2016-05-05 DIAGNOSIS — Z818 Family history of other mental and behavioral disorders: Secondary | ICD-10-CM

## 2016-05-05 DIAGNOSIS — E119 Type 2 diabetes mellitus without complications: Secondary | ICD-10-CM | POA: Diagnosis not present

## 2016-05-05 DIAGNOSIS — Z87891 Personal history of nicotine dependence: Secondary | ICD-10-CM

## 2016-05-05 DIAGNOSIS — Z8349 Family history of other endocrine, nutritional and metabolic diseases: Secondary | ICD-10-CM

## 2016-05-05 DIAGNOSIS — Z8249 Family history of ischemic heart disease and other diseases of the circulatory system: Secondary | ICD-10-CM

## 2016-05-05 DIAGNOSIS — Z833 Family history of diabetes mellitus: Secondary | ICD-10-CM

## 2016-05-05 DIAGNOSIS — Z8261 Family history of arthritis: Secondary | ICD-10-CM

## 2016-05-05 MED ORDER — INSULIN LISPRO 100 UNIT/ML ~~LOC~~ SOLN: 5.0000 [IU] | Freq: Three times a day (TID) | SUBCUTANEOUS | Status: DC

## 2016-05-05 MED ORDER — FLUCONAZOLE 150 MG PO TABS: 150.0000 mg | ORAL_TABLET | ORAL | Status: DC | PRN

## 2016-05-05 NOTE — Patient Instructions (Signed)
Start taking humalog 5 units with meals three times a day.    You can take 1 tablet of diflucan once for your vaginal discharge. If you do not see any improvement in symptoms then you may take another tab in 3 days.

## 2016-05-06 ENCOUNTER — Other Ambulatory Visit: Payer: Self-pay

## 2016-05-06 LAB — URINALYSIS, COMPLETE
BILIRUBIN UA: NEGATIVE
KETONES UA: NEGATIVE
LEUKOCYTES UA: NEGATIVE
Nitrite, UA: NEGATIVE
RBC UA: NEGATIVE
Urobilinogen, Ur: 0.2 mg/dL (ref 0.2–1.0)
pH, UA: 5.5 (ref 5.0–7.5)

## 2016-05-06 LAB — MICROALBUMIN / CREATININE URINE RATIO
Creatinine, Urine: 46 mg/dL
MICROALB/CREAT RATIO: 296.5 mg/g creat — ABNORMAL HIGH (ref 0.0–30.0)
Microalbumin, Urine: 136.4 ug/mL

## 2016-05-06 LAB — MICROSCOPIC EXAMINATION
BACTERIA UA: NONE SEEN
CASTS: NONE SEEN /LPF

## 2016-05-06 NOTE — Assessment & Plan Note (Signed)
Lab Results  Component Value Date   HGBA1C 14.3* 04/28/2016   HGBA1C >14.0 04/28/2016   HGBA1C 7.7 01/26/2016     Assessment: Diabetes control:  uncontrolled Progress toward A1C goal:   n/a Comments: pt returns to clinic for glucometer review. She has been compliant with lantus and [name]. Her diet has been poor due to her depression and she states she has been on a "comfort food" diet. Glucometer report ranges from 242-578.  Plan: Medications:  Continue lantus and januvia. Started on humalog 5 units TID w/ meals. Home glucose monitoring: Frequency:  tid Timing:  w meals Instruction/counseling given: reminded to bring blood glucose meter & log to each visit Educational resources provided:   Self management tools provided:   Other plans: f/u in 1 month.

## 2016-05-06 NOTE — Assessment & Plan Note (Addendum)
Pt has been having white vaginal d/c x 3 months with dysuria. Discharge does not have a foul odor. Denies itching. She does not have any increased frequency or flank pain. Discharge is a scant amt. Likely that w/ her hyperglycemia she has increased glucosuria increasing chance of a yeast infection.    - UA ordered, positive for yeast - rx for diflucan 150mg  q72 hours x 2 tabs.

## 2016-05-06 NOTE — Progress Notes (Signed)
Internal Medicine Clinic Attending  Case discussed with Dr. Truong at the time of the visit.  We reviewed the resident's history and exam and pertinent patient test results.  I agree with the assessment, diagnosis, and plan of care documented in the resident's note.  

## 2016-05-06 NOTE — Telephone Encounter (Signed)
Requesting pen needles to be filled.

## 2016-05-06 NOTE — Progress Notes (Signed)
Subjective:   Patient ID: [name] female   DOB: Dec 29, 1950 65 y.o.   MRN: [id number]  HPI: [name] is a 65 y.o. with past medical history as outlined below who presents to clinic for DM follow up.   Please see problem list for status of the pt's chronic medical problems.  Past Medical History  Diagnosis Date  . Hypertension   . Peripheral neuropathy (HCC)   . Cervicalgia   . Low back pain syndrome   . Hyperlipidemia   . Vitamin D deficiency   . Vitamin B12 deficiency   . Iliotibial band syndrome   . Anemia   . Borderline personality disorder   . Nonorganic psychosis   . GERD (gastroesophageal reflux disease)   . Diabetes mellitus   . Depression   . Diabetic gastroparesis (HCC)   . CAD (coronary artery disease)     Catherization 11/18/09>nonobstructive CAD , normal LV function, EF 65%  . Health maintenance examination     Colonoscopy 2009> normal; Diabetic eye exam 12/2008. No diabetic retinopathy; Mammogram 11/10> No evidence of malignancy, DXA 03/14/13 : normal  . Headache(784.0)   . Arthritis   . SBO (small bowel obstruction) (HCC) 01/09/2013  . Diastolic CHF (HCC)     Grade I, on Echo 06/2013, EF 55-60%  . Sleep apnea     does not wear CPAP   . Diabetes mellitus, type II (HCC)   . Wears glasses   . Wears dentures     upper  . Gout   . Right carpal tunnel syndrome 09/13/2014   Current Outpatient Prescriptions  Medication Sig Dispense Refill  . ACCU-CHEK AVIVA PLUS test strip TEST UPTO 4 TIMES DAILY 125 each 5  . ACCU-CHEK FASTCLIX LANCETS MISC TEST five times a day 102 each 11  . albuterol (PROVENTIL HFA;VENTOLIN HFA) 108 (90 BASE) MCG/ACT inhaler Inhale 2 puffs into the lungs every 6 (six) hours as needed for wheezing or shortness of breath. 1 Inhaler 2  . aspirin 81 MG chewable tablet Chew 81 mg by mouth daily.    Marland Kitchen buPROPion (WELLBUTRIN XL) 150 MG 24 hr tablet Take 3 tablets (450 mg total) by mouth daily. 270 tablet 1  . clonazePAM (KLONOPIN) 0.5 MG  tablet 1 bid 60 tablet 3  . cloNIDine (CATAPRES - DOSED IN MG/24 HR) 0.3 mg/24hr patch Place 1 patch (0.3 mg total) onto the skin every 7 (seven) days. (Patient taking differently: Place 0.3 mg onto the skin every 7 (seven) days. Thursday) 4 patch 11  . dexlansoprazole (DEXILANT) 60 MG capsule Take 1 capsule (60 mg total) by mouth daily. For reflux 30 capsule   . diclofenac sodium (VOLTAREN) 1 % GEL Apply 2 g topically 4 (four) times daily. 3 Tube 1  . fluconazole (DIFLUCAN) 150 MG tablet Take 1 tablet (150 mg total) by mouth every three (3) days as needed. 2 tablet 0  . insulin lispro (HUMALOG) 100 UNIT/ML injection Inject 0.05 mLs (5 Units total) into the skin 3 (three) times daily with meals. 10 mL 3  . Insulin Pen Needle (BD PEN NEEDLE NANO U/F) 32G X 4 MM MISC Use to inject insulin into the skin 100 each 6  . isosorbide mononitrate (IMDUR) 60 MG 24 hr tablet Take 60 mg by mouth daily. Reported on 03/15/2016  0  . LANTUS SOLOSTAR 100 UNIT/ML Solostar Pen INJECT 51 UNITS AT BEDTIME 30 mL 6  . losartan (COZAAR) 100 MG tablet Take 1 tablet (100 mg total) by mouth daily.  90 tablet 4  . metoprolol succinate (TOPROL-XL) 25 MG 24 hr tablet Take 1 tablet (25 mg total) by mouth every morning. 90 tablet 4  . ondansetron (ZOFRAN) 4 MG tablet Take 4 mg by mouth every 8 (eight) hours as needed for nausea. Reported on 03/15/2016    . rosuvastatin (CRESTOR) 10 MG tablet take 1 tablet by mouth at bedtime 90 tablet 3  . senna (SENOKOT) 8.6 MG tablet Take 5 tablets by mouth daily. Reported on 03/15/2016    . sertraline (ZOLOFT) 100 MG tablet 2  qam 180 tablet 2  . sitaGLIPtin (JANUVIA) 50 MG tablet Take 1 tablet (50 mg total) by mouth daily. 90 tablet 4  . traZODone (DESYREL) 50 MG tablet Take 1 tablet (50 mg total) by mouth at bedtime. (Patient not taking: Reported on 01/01/2016) 90 tablet 1   No current facility-administered medications for this visit.   Family History  Problem Relation Age of Onset  . Diabetes  Mother   . Hypertension Mother   . Hyperlipidemia Mother   . Arthritis Mother   . Depression Mother   . Heart disease Father   . Diabetes Brother   . Heart disease Brother   . Heart disease Paternal Grandmother   . Diabetes Brother    Social History   Social History  . Marital Status: Married    Spouse Name: N/A  . Number of Children: N/A  . Years of Education: N/A   Social History Main Topics  . Smoking status: Former Smoker    Types: Cigarettes    Quit date: 10/25/2002  . Smokeless tobacco: Never Used  . Alcohol Use: No  . Drug Use: No  . Sexual Activity: Not Currently   Other Topics Concern  . Not on file   Social History Narrative   Review of Systems: GU: burning on urination, white vaginal discharge w/o foul odor. Neg for polyuria GI: chronic nausea, no vomiting Endocrine: + for polydipsia Psych: depression stable, able to verbalize emergency contacts in case of SI.   Objective:  Physical Exam: Filed Vitals:   05/05/16 1547  BP: 144/62  Pulse: 79  Temp: 98.8 F (37.1 C)  TempSrc: Oral  Height: 5\' 5"  (1.651 m)  Weight: 187 lb 9.6 oz (85.095 kg)  SpO2: 98%   Physical Exam  Constitutional: She appears well-developed and well-nourished. No distress.  Cardiovascular: Normal rate and regular rhythm.  Exam reveals no gallop and no friction rub.   No murmur heard. Pulmonary/Chest: Effort normal and breath sounds normal. No respiratory distress. She has no wheezes. She has no rales.  Abdominal: Soft. Bowel sounds are normal. She exhibits no distension.  Genitourinary:  Refusing pelvic exam   Skin: Skin is warm and dry. No rash noted. She is not diaphoretic. No erythema. No pallor.    Assessment & Plan:   Please see problem based assessment and plan.

## 2016-05-10 ENCOUNTER — Other Ambulatory Visit: Payer: Self-pay | Admitting: *Deleted

## 2016-05-10 MED ORDER — INSULIN PEN NEEDLE 32G X 4 MM MISC: Status: DC

## 2016-05-11 ENCOUNTER — Other Ambulatory Visit: Payer: Self-pay | Admitting: Internal Medicine

## 2016-05-11 DIAGNOSIS — Z1231 Encounter for screening mammogram for malignant neoplasm of breast: Secondary | ICD-10-CM

## 2016-05-11 MED ORDER — "INSULIN SYRINGE-NEEDLE U-100 30G X 5/16"" 0.5 ML MISC": Status: DC

## 2016-05-21 ENCOUNTER — Ambulatory Visit
Admission: RE | Admit: 2016-05-21 | Discharge: 2016-05-21 | Disposition: A | Discharge status: Home / Self Care | Payer: Medicare Other | Source: Ambulatory Visit | Attending: Internal Medicine | Admitting: Internal Medicine

## 2016-05-21 DIAGNOSIS — Z1231 Encounter for screening mammogram for malignant neoplasm of breast: Secondary | ICD-10-CM

## 2016-05-24 ENCOUNTER — Ambulatory Visit (INDEPENDENT_AMBULATORY_CARE_PROVIDER_SITE_OTHER): Payer: Self-pay | Admitting: Clinical

## 2016-05-24 DIAGNOSIS — F331 Major depressive disorder, recurrent, moderate: Secondary | ICD-10-CM

## 2016-05-24 DIAGNOSIS — F4321 Adjustment disorder with depressed mood: Secondary | ICD-10-CM

## 2016-05-24 DIAGNOSIS — F411 Generalized anxiety disorder: Secondary | ICD-10-CM

## 2016-05-24 NOTE — Progress Notes (Signed)
   THERAPIST PROGRESS NOTE  Session Time: 1:33 -2:27  Participation Level: Active  Behavioral Response: CasualAlertless depressed  Type of Therapy: Individual Therapy  Treatment Goals addressed: Improve Psychiatric Symptoms, Calming Skills, Emotional Regulation Skills,  Interventions: Motivational Interviewing,   Summary: [name] is a 65 y.o. female who presents with Major Depressive Disorder, Generalized Anxiety Disorder and Grief   Suicidal/Homicidal: No -without intent/plan  Therapist Response:  [name] met with clinician for an individual session. [name] shared about her psychiatric symptoms, her current life events and her homework. [name] shared that she felt better this week though she had some depression. She shared that she had been working hard to "turn things around". [name] shared some from her gratitude journal. Clinician asked open ended questions and [name] shared that she made an effort to get out and socialize more. She shared that she celebrated her 61th wedding anniversary and had a wonderful time. She shared that she visited family and friends. Clinician asked open ended questions and [name] shared her thoughts and insights about getting out more. She shared that it was difficult to do but that she felt better having done so. Client and clinician discussed how she could motivate herself to continue to challenge herself to socialize.   She shared that she did have some depression due to her grandson getting a 9 year sentence. She also shared her daughter's 1 year death anniversary is coming up. Client and clinician discussed some of her worry and depression thoughts. Client and clinician disccussed focus and feelings. Client and clinician discussed emotional regulation skills.         Plan: Return again in 1-2 weeks.  Diagnosis:     Axis I: Major Depressive Disorder, Generalized Anxiety Disorder and Grief    Odell Fasching A, LCSW 05/24/2016

## 2016-05-26 ENCOUNTER — Encounter (HOSPITAL_COMMUNITY): Payer: Self-pay | Admitting: Clinical

## 2016-06-07 ENCOUNTER — Ambulatory Visit (INDEPENDENT_AMBULATORY_CARE_PROVIDER_SITE_OTHER): Payer: Self-pay | Admitting: Clinical

## 2016-06-07 DIAGNOSIS — F331 Major depressive disorder, recurrent, moderate: Secondary | ICD-10-CM

## 2016-06-07 NOTE — Progress Notes (Signed)
   THERAPIST PROGRESS NOTE  Session Time: 1:33- 2:29  Participation Level: Active  Behavioral Response: NeatAlertDepressed  Type of Therapy: Individual Therapy  Treatment Goals addressed: Improve Psychiatric Symptoms,Emotional Regulation Skills, Interpersonal Relationship Skills  Interventions: Motivational Interviewing, CBT, Grounding & Mindfulness Techniques  Summary: [name] is a 65 y.o. female who presents with Major Depressive Disorder, Generalized Anxiety Disorder and Grief   Suicidal/Homicidal: No -without intent/plan  Therapist Response:  [name] discussed her psychiatric symptoms, her current life events and her homework. [name] shared that she has been working to get out and socializing more, practice her grounding and mindfulness techniques, and keeping her gratitude journal. She shared that while it has been difficult to socialize more she has recognized the benefit. She shared that while she still experiences depression and a desire to isolate, she has enjoyed the company of others. Cliniician asked open ended questions and [name] shared about her efforts, others responses, and the effects on her mental health. [name] shared about her relationship with her husband. She shared her thoughts and insights about the relationship. Client and clinician discussed healthy interpersonal relationship skills. [name] agrred to continue practicing her skills and efforts to socialize more   Plan: Return again in 1-2 weeks.  Diagnosis:     Axis I: Major Depressive Disorder, Generalized Anxiety Disorder and Grief    Shephanie Romas A, LCSW 06/07/2016

## 2016-06-08 ENCOUNTER — Encounter: Payer: Self-pay | Admitting: Internal Medicine

## 2016-06-08 ENCOUNTER — Ambulatory Visit (INDEPENDENT_AMBULATORY_CARE_PROVIDER_SITE_OTHER): Payer: Medicare Other | Admitting: Internal Medicine

## 2016-06-08 VITALS — BP 159/56 | Temp 98.2°F | Ht 65.0 in | Wt 190.5 lb

## 2016-06-08 DIAGNOSIS — M7071 Other bursitis of hip, right hip: Secondary | ICD-10-CM

## 2016-06-08 NOTE — Progress Notes (Signed)
   CC: Right hip pain   HPI:  [name] is a 65 y.o. female with PMHx of HTN, anxiety, low back pain and DM type II that [facility].  In January 2017 she had a fall after trying to pick something up off the floor and landed on her back, more on the right side.  She has had 2 more falls since but landing specifically on her bottom.  She states she has had rehab for approximately 3 months for gait instability that was leading to her falls.  Her last fall was in march 2017.  Today she is having pain that started on the hip joint area but now hurts mostly on her bottom.  She states if she sits too long the pain becomes worse.  She reports stretching and walking helps.  The pain also travels along the lateral aspect of the thigh that she describes as dull.  She denies any sharp shooting pain that radiates down the leg.  Past Medical History:  Diagnosis Date  . Anemia   . Arthritis   . Borderline personality disorder   . CAD (coronary artery disease)    Catherization 11/18/09>nonobstructive CAD , normal LV function, EF 65%  . Cervicalgia   . Depression   . Diabetes mellitus   . Diabetes mellitus, type II (HCC)   . Diabetic gastroparesis (HCC)   . Diastolic CHF (HCC)    Grade I, on Echo 06/2013, EF 55-60%  . GERD (gastroesophageal reflux disease)   . Gout   . Headache(784.0)   . Health maintenance examination    Colonoscopy 2009> normal; Diabetic eye exam 12/2008. No diabetic retinopathy; Mammogram 11/10> No evidence of malignancy, DXA 03/14/13 : normal  . Hyperlipidemia   . Hypertension   . Iliotibial band syndrome   . Low back pain syndrome   . Nonorganic psychosis   . Peripheral neuropathy (HCC)   . Right carpal tunnel syndrome 09/13/2014  . SBO (small bowel obstruction) (HCC) 01/09/2013  . Sleep apnea    does not wear CPAP   . Vitamin B12 deficiency   . Vitamin D deficiency   . Wears dentures    upper  . Wears glasses     Review of Systems:  Review of  Systems  Constitutional: Negative for chills and fever.  Respiratory: Negative for shortness of breath.   Cardiovascular: Negative for chest pain and leg swelling.  Genitourinary: Negative for hematuria.  Musculoskeletal: Positive for back pain.  Skin: Negative for rash.  Neurological: Negative for dizziness.     Physical Exam:  Vitals:   06/08/16 1438  BP: (!) 159/56  Temp: 98.2 F (36.8 C)  TempSrc: Oral  SpO2: 98%  Weight: 190 lb 8 oz (86.4 kg)  Height: 5\' 5"  (1.651 m)   Physical Exam  Constitutional: She is well-developed, well-nourished, and in no distress.  HENT:  Head: Normocephalic and atraumatic.  Cardiovascular: Normal rate, regular rhythm and normal heart sounds.   Pulmonary/Chest: Effort normal and breath sounds normal.  Abdominal: Soft. She exhibits no distension. There is no tenderness.  Musculoskeletal: She exhibits no edema.  5/5 motor strength in lower extremities bilaterally No pain noted on abduction and extension of right leg Point tenderness on ischial tuberosity     Assessment & Plan:   See encounters tab for problem based medical decision making.   Patient seen with Dr. Josem KaufmannKlima

## 2016-06-08 NOTE — Patient Instructions (Signed)
[name]  A referral has been made to sports medicine for a steroid injection  Please use cushions to relieve the pain on your right side.

## 2016-06-09 DIAGNOSIS — M7070 Other bursitis of hip, unspecified hip: Secondary | ICD-10-CM | POA: Insufficient documentation

## 2016-06-09 NOTE — Assessment & Plan Note (Addendum)
Assessment:  Right ischial bursitis Patient has had several falls to the right side of her buttocks in the last 8 months.  On exam she has point tenderness to the right ischial tuberosity.  Plan - Sport medicine referral for steroid injection - counseled patient on using cushions to help relieve pressure on right buttocks region

## 2016-06-10 NOTE — Progress Notes (Signed)
I saw and evaluated the patient.  I personally confirmed the key portions of Dr. Ratliff-Hoffman's history and exam and reviewed pertinent patient test results.  The assessment, diagnosis, and plan were formulated together and I agree with the documentation in the resident's note. 

## 2016-06-13 ENCOUNTER — Encounter (HOSPITAL_COMMUNITY): Payer: Self-pay | Admitting: Clinical

## 2016-06-16 ENCOUNTER — Encounter (HOSPITAL_COMMUNITY): Payer: Self-pay | Admitting: Psychiatry

## 2016-06-16 ENCOUNTER — Ambulatory Visit (INDEPENDENT_AMBULATORY_CARE_PROVIDER_SITE_OTHER): Payer: Self-pay | Admitting: Psychiatry

## 2016-06-16 VITALS — BP 112/68 | HR 78 | Ht 65.0 in | Wt 192.0 lb

## 2016-06-16 DIAGNOSIS — F32A Depression, unspecified: Secondary | ICD-10-CM

## 2016-06-16 DIAGNOSIS — F3342 Major depressive disorder, recurrent, in full remission: Secondary | ICD-10-CM

## 2016-06-16 DIAGNOSIS — F329 Major depressive disorder, single episode, unspecified: Secondary | ICD-10-CM

## 2016-06-16 MED ORDER — BUPROPION HCL ER (XL) 150 MG PO TB24: 450.0000 mg | ORAL_TABLET | Freq: Every day | ORAL | 1 refills | Status: DC

## 2016-06-16 MED ORDER — SERTRALINE HCL 100 MG PO TABS: ORAL_TABLET | ORAL | 2 refills | Status: DC

## 2016-06-16 NOTE — Progress Notes (Signed)
Patient ID: [name], female   DOB: 1951/07/29, 65 y.o.   MR[id number] [facility]  06/16/2016 3:34 PM [name]  MRN:  [id number] Subjective:   Better  Happy Principal Problem: major depression , recurrent , mild Diagnosis:   Major depression, recurrent, mild   Today the patient is doing well. Her mood is improved since her last visit. Is not clear why she felt depressed but she withdrew from the church and felt somewhat dysphoric. Her mood is better now. She enjoys time with her 66-year-old great grandson. Her husband is well. Patient says she's having some trouble with diabetes control. Her blood pressure is well-controlled. She does not smoke. The patient denies any problems with her sleep and is eating very well. She's got good energy. She doesn't like to read but she watches TV regularly. Overall her health is stable the patient is not suicidal. The patient is no evidence of psychosis. She's actually functioning well. Overall she's improved on the maximum dose of Zoloft and Wellbutrin. Total Time spent with patient: 30 minutes  Past Psychiatric History:   Past Medical History:  Past Medical History:  Diagnosis Date  . Anemia   . Arthritis   . Borderline personality disorder   . CAD (coronary artery disease)    Catherization 11/18/09>nonobstructive CAD , normal LV function, EF 65%  . Cervicalgia   . Depression   . Diabetes mellitus   . Diabetes mellitus, type II (HCC)   . Diabetic gastroparesis (HCC)   . Diastolic CHF (HCC)    Grade I, on Echo 06/2013, EF 55-60%  . GERD (gastroesophageal reflux disease)   . Gout   . Headache(784.0)   . Health maintenance examination    Colonoscopy 2009> normal; Diabetic eye exam 12/2008. No diabetic retinopathy; Mammogram 11/10> No evidence of malignancy, DXA 03/14/13 : normal  . Hyperlipidemia   . Hypertension   . Iliotibial band syndrome   . Low back pain syndrome   . Nonorganic psychosis   . Peripheral neuropathy (HCC)   .  Right carpal tunnel syndrome 09/13/2014  . SBO (small bowel obstruction) (HCC) 01/09/2013  . Sleep apnea    does not wear CPAP   . Vitamin B12 deficiency   . Vitamin D deficiency   . Wears dentures    upper  . Wears glasses     Past Surgical History:  Procedure Laterality Date  . ABDOMINAL HYSTERECTOMY    . APPENDECTOMY    . BOWEL RESECTION N/A 01/10/2013   Procedure: SMALL BOWEL RESECTION;  Surgeon: Lodema PilotBrian Layton, DO;  Location: [facility];  Service: General;  Laterality: N/A;  . CARPAL TUNNEL RELEASE Right 09/13/2014   Procedure: RIGHT WRIST CARPAL TUNNEL RELEASE;  Surgeon: Eulas PostJoshua P Landau, MD;  Location: [facility];  Service: Orthopedics;  Laterality: Right;  . CARPAL TUNNEL RELEASE Left 02/28/2015   Procedure: LEFT CARPAL TUNNEL RELEASE;  Surgeon: Teryl LucyJoshua Landau, MD;  Location: [facility];  Service: Orthopedics;  Laterality: Left;  . HAND SURGERY  1992   rt  . HERNIA REPAIR  03/01/14   Lap incisional hernia repair w/mesh  . INCISIONAL HERNIA REPAIR N/A 03/01/2014   Procedure: LAPAROSCOPIC INCISIONAL HERNIA;  Surgeon: Axel FillerArmando Ramirez, MD;  Location: [facility];  Service: General;  Laterality: N/A;  . INSERTION OF MESH N/A 03/01/2014   Procedure: INSERTION OF MESH;  Surgeon: Axel FillerArmando Ramirez, MD;  Location: [facility];  Service: General;  Laterality: N/A;  . LAPAROTOMY N/A 01/10/2013   Procedure:  EXPLORATORY LAPAROTOMY;  Surgeon: Lodema Pilot, DO;  Location: [facility];  Service: General;  Laterality: N/A;  . LYSIS OF ADHESION N/A 01/10/2013   Procedure: LYSIS OF ADHESION;  Surgeon: Lodema Pilot, DO;  Location: [facility];  Service: General;  Laterality: N/A;  . STERIOD INJECTION Left 09/13/2014   Procedure: STEROID INJECTION LEFT HAND;  Surgeon: Eulas Post, MD;  Location: [facility];  Service: Orthopedics;  Laterality: Left;   Family History:  Family History  Problem Relation Age of Onset  . Diabetes Mother   . Hypertension Mother   . Hyperlipidemia Mother   .  Arthritis Mother   . Depression Mother   . Heart disease Father   . Diabetes Brother   . Heart disease Brother   . Heart disease Paternal Grandmother   . Diabetes Brother    Family Psychiatric  History:  Social History:  History  Alcohol Use No     History  Drug Use No    Social History   Social History  . Marital status: Married    Spouse name: N/A  . Number of children: N/A  . Years of education: N/A   Social History Main Topics  . Smoking status: Former Smoker    Types: Cigarettes    Quit date: 10/25/2002  . Smokeless tobacco: Never Used  . Alcohol use No  . Drug use: No  . Sexual activity: Not Currently   Other Topics Concern  . None   Social History Narrative  . None   Additional Social History:                         Sleep: Good  Appetite:  Good  Current Medications: Current Outpatient Prescriptions  Medication Sig Dispense Refill  . ACCU-CHEK AVIVA PLUS test strip TEST UPTO 4 TIMES DAILY 125 each 5  . ACCU-CHEK FASTCLIX LANCETS MISC TEST five times a day 102 each 11  . albuterol (PROVENTIL HFA;VENTOLIN HFA) 108 (90 BASE) MCG/ACT inhaler Inhale 2 puffs into the lungs every 6 (six) hours as needed for wheezing or shortness of breath. 1 Inhaler 2  . aspirin 81 MG chewable tablet Chew 81 mg by mouth daily.    Marland Kitchen buPROPion (WELLBUTRIN XL) 150 MG 24 hr tablet Take 3 tablets (450 mg total) by mouth daily. 270 tablet 1  . clonazePAM (KLONOPIN) 0.5 MG tablet 1 bid 60 tablet 3  . cloNIDine (CATAPRES - DOSED IN MG/24 HR) 0.3 mg/24hr patch Place 1 patch (0.3 mg total) onto the skin every 7 (seven) days. (Patient taking differently: Place 0.3 mg onto the skin every 7 (seven) days. Thursday) 4 patch 11  . dexlansoprazole (DEXILANT) 60 MG capsule Take 1 capsule (60 mg total) by mouth daily. For reflux 30 capsule   . diclofenac sodium (VOLTAREN) 1 % GEL Apply 2 g topically 4 (four) times daily. 3 Tube 1  . fluconazole (DIFLUCAN) 150 MG tablet Take 1 tablet  (150 mg total) by mouth every three (3) days as needed. 2 tablet 0  . insulin lispro (HUMALOG) 100 UNIT/ML injection Inject 0.05 mLs (5 Units total) into the skin 3 (three) times daily with meals. 10 mL 3  . Insulin Pen Needle (BD PEN NEEDLE NANO U/F) 32G X 4 MM MISC Use to inject insulin into the skin 100 each 6  . Insulin Syringe-Needle U-100 (SAFETY INSULIN SYRINGES) 30G X 5/16" 0.5 ML MISC Use to inject insulin 3 times daily. diag code E11.9. Insulin  dependent 300 each 3  . Insulin Syringe-Needle U-100 (SAFETY INSULIN SYRINGES) 30G X 5/16" 0.5 ML MISC Use to inject insulin into the skin 3 times daily. diag code E11.9. Insulin dependent 300 each 3  . isosorbide mononitrate (IMDUR) 60 MG 24 hr tablet Take 60 mg by mouth daily. Reported on 03/15/2016  0  . LANTUS SOLOSTAR 100 UNIT/ML Solostar Pen INJECT 51 UNITS AT BEDTIME 30 mL 6  . losartan (COZAAR) 100 MG tablet Take 1 tablet (100 mg total) by mouth daily. 90 tablet 4  . metoprolol succinate (TOPROL-XL) 25 MG 24 hr tablet Take 1 tablet (25 mg total) by mouth every morning. 90 tablet 4  . ondansetron (ZOFRAN) 4 MG tablet Take 4 mg by mouth every 8 (eight) hours as needed for nausea. Reported on 03/15/2016    . rosuvastatin (CRESTOR) 10 MG tablet take 1 tablet by mouth at bedtime 90 tablet 3  . senna (SENOKOT) 8.6 MG tablet Take 5 tablets by mouth daily. Reported on 03/15/2016    . sertraline (ZOLOFT) 100 MG tablet 2  qam 180 tablet 2  . sitaGLIPtin (JANUVIA) 50 MG tablet Take 1 tablet (50 mg total) by mouth daily. 90 tablet 4  . traZODone (DESYREL) 50 MG tablet Take 1 tablet (50 mg total) by mouth at bedtime. (Patient not taking: Reported on 01/01/2016) 90 tablet 1   No current facility-administered medications for this visit.     Lab Results: No results found. However, due to the size of the patient record, not all encounters were searched. Please check Results Review for a complete set of results.  Physical Findings: AIMS:  , ,  ,  ,    CIWA:     COWS:     Musculoskeletal: Strength & Muscle Tone: within normal limits Gait & Station: normal Patient leans: N/A  Psychiatric Specialty Exam: ROS  Blood pressure 112/68, pulse 78, height 5\' 5"  (1.651 m), weight 192 lb (87.1 kg).Body mass index is 31.95 kg/m.  General Appearance: Casual  Eye Contact::  Good  Speech:  Clear and Coherent  Volume:  Normal  Mood:  Euthymic  Affect:  Congruent  Thought Process:  Coherent  Orientation:  Full (Time, Place, and Person)  Thought Content:  WDL  Suicidal Thoughts:  No  Homicidal Thoughts:  No  Memory:  NA  Judgement:  Good  Insight:  Good  Psychomotor Activity:  Normal  Concentration:  Good  Recall:  Good  Fund of Knowledge:Good  Language: Good  Akathisia:  No  Handed:  Right  AIMS (if indicated):     Assets:  Desire for Improvement  ADL's:  Intact  Cognition: WNL  Sleep:      Treatment Plan Summary: 06/16/2016, 3:34 PM This patient's first problem is that of major depression. She is on dual medications both Zoloft maximum dose 200 mg and Wellbutrin 450 mg. Over the last year we've been slowly reducing her Klonopin and now is down to 0.5 mg twice a day. We will ask her to continue taking Klonopin by taking 0.5 mg 1 at night. She has 7 more left in her bottle. So when she returns she'll be off Klonopin and only be taking Zoloft and Wellbutrin. At this time the patient seems stable. She is active. She is functioning well. She'll return to see me in 4 months.

## 2016-06-18 ENCOUNTER — Ambulatory Visit (INDEPENDENT_AMBULATORY_CARE_PROVIDER_SITE_OTHER): Payer: Medicare Other | Admitting: Family Medicine

## 2016-06-18 ENCOUNTER — Encounter: Payer: Self-pay | Admitting: Family Medicine

## 2016-06-18 VITALS — BP 148/120 | Ht 65.0 in | Wt 189.6 lb

## 2016-06-18 DIAGNOSIS — M25551 Pain in right hip: Secondary | ICD-10-CM

## 2016-06-21 ENCOUNTER — Ambulatory Visit (INDEPENDENT_AMBULATORY_CARE_PROVIDER_SITE_OTHER): Payer: Self-pay | Admitting: Clinical

## 2016-06-21 DIAGNOSIS — F411 Generalized anxiety disorder: Secondary | ICD-10-CM

## 2016-06-21 DIAGNOSIS — M25559 Pain in unspecified hip: Secondary | ICD-10-CM | POA: Insufficient documentation

## 2016-06-21 DIAGNOSIS — F331 Major depressive disorder, recurrent, moderate: Secondary | ICD-10-CM

## 2016-06-21 DIAGNOSIS — F4321 Adjustment disorder with depressed mood: Secondary | ICD-10-CM

## 2016-06-21 NOTE — Assessment & Plan Note (Signed)
Odd presentation. I want to get a CT of this area to rule out anything occult fracture wise. Potentially we could try injection of the ischial bursa area under ultrasound guidance although this area was somewhat difficult to image with our portable machine, I might be able to get a better view using our other machine. First of like to see that there is no osseous lesion, no cold hairline fracture, no mass etc. In the meantime I will have her use a doughnut pillow or convert. I'll see her back in one week assuming we can get the CT scan that interim.

## 2016-06-21 NOTE — Progress Notes (Signed)
[name] - 65 y.o. female MRN [id number]  Date of birth: 12/29/1950    SUBJECTIVE:     Chief Complaint: Right hip and buttock pain  HPI: Continued right hip and buttock pain since a fall back in January. Was recently seen at the internal medicine clinic and they're note is listed below for reference.  From IM OV note 06/08/2016: In January 2017 she had a fall after trying to pick something up off the floor and landed on her back, more on the right side.  She has had 2 more falls since but landing specifically on her bottom.  She states she has had rehab for approximately 3 months for gait instability that was leading to her falls.  Her last fall was in march 2017.  Today she is having pain that started on the hip joint area but now hurts mostly on her bottom.  She reports having no improvement in her pain it is mostly in the posterior distal buttock area. Bothers her when she sits. She has completed significant amount of physical therapy and feels better instead here in her gait has had no improvement in this buttock/hip pain. It bothers her most when she is seated or trying to lie down. It does bother her some if she stands for long period of time. It has been stable over the last few months, 6-8 out of 10 at its worst, 2-4 out of 10 other times. Pain is localized to the part of her buttock she sits on. It aches. Sometimes it radiates a little bit into the rest of the buttock and hip, occasionally it is made the front part of her hip and anterior proximal thigh hurt but she's had no leg weakness, no numbness in the right leg.  Regarding her gait, she's had improvement in her steadiness and is a longer having multiple falls by her report.  ROS:     She's had no unusual weight change. She denies fever, sweats, chills. No urinary or fecal incontinence or change in bowel habits. See history of present illness above for additional pertinent review of systems.  PERTINENT  PMH / PSH FH / / SH:  Past  Medical, Surgical, Social, and Family History Reviewed & Updated in the EMR.  Pertinent findings include:  Diabetes mellitus with peripheral neuropathy. The only thing I can gather from her chart and from her reports that she has some mild stocking glove loss of soft touch sensation. History of Lumbar spondylosis with myelopathy: This problem was placed on her chart list in March by physical therapy and seems to relate to her generalized gait disturbance. I see nothing that indicates there was specific known spinal pathology Hypertension Diastolic congestive heart failure Depression Coronary artery disease Anxiety CK D stage III  A1c in July 2017 was 14.3  imaging: X-ray right hip and pelvis on 02/04/2016 report,: IMPRESSION: No acute osseous abnormality identified about the right hip or pelvis. Normal for age hip joint spaces.   OBJECTIVE: BP (!) 148/120   Ht 5\' 5"  (1.651 m)   Wt 189 lb 9.6 oz (86 kg)   BMI 31.55 kg/m   Physical Exam:  Vital signs are reviewed. GEN.: Well-developed slightly overweight female no acute distress GAIT: Mildly antalgic favoring the right side. HIPS: Internal and external rotation is painless and full. Hip flexion and extension strength is full bilaterally. Axial loading of each hip in flexed and extended positions causes no pain within the joint space. BUTTOCK: Tender to palpation over the  right is she'll area and this reproduces her pain. SKIN: There is no ecchymoses, no increased erythema, no lesions or rash noted on the right buttock area.  ULTRASOUND: Limited visualization secondary to body habitus. I see nothing grossly abnormal in the gluteus maximus, gluteus medius. There may be a small fluid collection surrounding the area of the if she him but visualization limited by body habitus. There is no unusual fluid in the right hip joint.  ASSESSMENT & PLAN:  See problem based charting & AVS for pt instructions.

## 2016-06-21 NOTE — Progress Notes (Signed)
THERAPIST PROGRESS NOTE  Session Time: 3:50 -4:30  Participation Level: Active  Behavioral Response: CasualAlertNA  Type of Therapy: Individual Therapy  Treatment Goals addressed: Improve Psychiatric Symptoms, Calming Skills, Emotional Regulation Skills,  Interventions: Motivational Interviewing, CBT,   Summary: [name] is a 65 y.o. female who presents with Major Depressive Disorder, Generalized Anxiety Disorder and Grief   Suicidal/Homicidal: No -without intent/plan  Therapist Response:  [name] met with clinician for an individual session. [name] shared about her psychiatric symptoms, her current life events and her homework. [name] shared that that she was doing a little bit better this week. She shared that she has been making efforts to get out of the house. And to engage more with others. Clinician asked open ended questions and [name] identified the skills that she was practicing. [name] shared that she was keeping a Banker and practicing her grounding and mindfulness techniques. [name] identified some negative thoughts that she would like to change. Clinician asked open ended questions and [name] identified healthier alternative thoughts. [name] shared a bout missing her daughter and her efforts to remember her without being overwhelmed with negative emotions. Clinician asked open ended questions and [name] shared how her efforts have been improving her life. Clinician asked open ended questions and [name] identified some ways she could herself from going back to isolating.   Plan: Return again in 1-2 weeks.  Diagnosis:     Axis I: Major Depressive Disorder, Generalized Anxiety Disorder and Grief   [name] A, LCSW 06/21/2016

## 2016-06-24 ENCOUNTER — Ambulatory Visit
Admission: RE | Admit: 2016-06-24 | Discharge: 2016-06-24 | Disposition: A | Discharge status: Home / Self Care | Payer: Medicare Other | Source: Ambulatory Visit | Attending: Family Medicine | Admitting: Family Medicine

## 2016-06-24 DIAGNOSIS — M25551 Pain in right hip: Secondary | ICD-10-CM | POA: Diagnosis not present

## 2016-06-25 ENCOUNTER — Ambulatory Visit (INDEPENDENT_AMBULATORY_CARE_PROVIDER_SITE_OTHER): Payer: Medicare Other | Admitting: Family Medicine

## 2016-06-25 ENCOUNTER — Encounter: Payer: Self-pay | Admitting: Family Medicine

## 2016-06-25 DIAGNOSIS — M7071 Other bursitis of hip, right hip: Secondary | ICD-10-CM | POA: Diagnosis not present

## 2016-06-25 DIAGNOSIS — M25551 Pain in right hip: Secondary | ICD-10-CM

## 2016-06-25 MED ORDER — METHYLPREDNISOLONE ACETATE 40 MG/ML IJ SUSP
40.0000 mg | Freq: Once | INTRAMUSCULAR | Status: AC
  Administered 2016-06-25: 40 mg via INTRA_ARTICULAR

## 2016-06-27 NOTE — Progress Notes (Signed)
    CHIEF COMPLAINT / HPI:  RIGHT buttock pain No improvement or worsening in pain since I saw her last We did get pelvic CT scan in interim---here to discuss results and plan  REVIEW OF SYSTEMS:  Pertinent review of systems: negative for fever or unusual weight change.   OBJECTIVE:  Vital signs are reviewed.  TTP right ishial bursa area and this reproduces her pain. Left is nontender to palpation .IMAGING: no sign of occult fracture oro other unexpected pathology that would be responsible for her pain.  UKTRASOUND: area of ishial prominence was identified an a small rim of fluid noted surrounding the distal portion. The remainder of the gluteus muscles apperaed normal. This thin rim of fluid became the target of the injection. INJECTION Patient was given informed consent, signed copy in the chart. Patient placed in left lateral decubitus position. Appropriate time out was taken. Area prepped and draped in usual sterile fashion. Utrasound again used (under streile conditions) to find the apparent ischial bursa sac. A sample of 1 cc of methylprednisolone 40 mg/ml plus  4 cc of 1% lidocaine without epinephrine was injected into the right ischial bursa using a(n) ultrasound guided approach with a 22 gauge spinal needle. The patient tolerated the procedure well. There were no complications. Post procedure instructions were given.   ASSESSMENT / PLAN:  I would like her to follow up by phone in 1-2 weeks with results of injection.

## 2016-06-27 NOTE — Assessment & Plan Note (Addendum)
Chronic ischial bursitis after a fall many months ago. I reviewed the CT scan images and findings with her. Discussed option. She decided to try CSI which will have to be done under US guidance. US guided CSI performed  today

## 2016-06-29 ENCOUNTER — Ambulatory Visit (HOSPITAL_COMMUNITY): Payer: Self-pay | Admitting: Clinical

## 2016-07-01 ENCOUNTER — Other Ambulatory Visit (HOSPITAL_COMMUNITY)
Admission: RE | Admit: 2016-07-01 | Discharge: 2016-07-01 | Disposition: A | Discharge status: Home / Self Care | Payer: Medicare Other | Source: Ambulatory Visit | Attending: Internal Medicine | Admitting: Internal Medicine

## 2016-07-01 ENCOUNTER — Ambulatory Visit (INDEPENDENT_AMBULATORY_CARE_PROVIDER_SITE_OTHER): Payer: Medicare Other | Admitting: Internal Medicine

## 2016-07-01 VITALS — BP 121/54 | HR 72 | Temp 98.4°F | Ht 65.0 in | Wt 191.0 lb

## 2016-07-01 DIAGNOSIS — N898 Other specified noninflammatory disorders of vagina: Secondary | ICD-10-CM

## 2016-07-01 DIAGNOSIS — Z113 Encounter for screening for infections with a predominantly sexual mode of transmission: Secondary | ICD-10-CM | POA: Insufficient documentation

## 2016-07-01 DIAGNOSIS — N760 Acute vaginitis: Secondary | ICD-10-CM | POA: Insufficient documentation

## 2016-07-01 LAB — POCT URINALYSIS DIPSTICK
BILIRUBIN UA: NEGATIVE
Blood, UA: NEGATIVE
GLUCOSE UA: NEGATIVE
Ketones, UA: NEGATIVE
Leukocytes, UA: NEGATIVE
NITRITE UA: NEGATIVE
Protein, UA: 30
Spec Grav, UA: 1.025
Urobilinogen, UA: 0.2
pH, UA: 5.5

## 2016-07-01 NOTE — Assessment & Plan Note (Addendum)
Patient is here for continuing vaginal discharge. She was seen in July and was given 2 doses of diflucan after UA showing yeast. She says at that time the discharge was white, but now it is brownish in color. She denies hematuria or bleeding. Denies being sexually active in the last 6 months. Has dyspareunia. Denies urinary frequency or urgency. A pelvic exam/wet prep was attempted 2 times with 2 speculums in the presence of chaperone [name] but unable to perform due to significant patient discomfort.  A dipstick UA was negative for leuks or nitrites.  A: most likely postmenopausal vaginal atrophy, but need to rule out infectious etiologies  P: -ordered urine ancillary studies- gc/ch, candidiasis  -can send premarin cream if all studies negative  Addendum: 07/05/2016-  Recurrent complicated candidiasis . Discussed with Dr Criselda PeachesMullen UA positive for yeast again. Urine ancillary studies negative.   Called the patient and informed her of above results.   Sent diflucan 150 mg daily for 14 days Pt will follow up in 1 month to reassess her symptoms

## 2016-07-01 NOTE — Patient Instructions (Signed)
Thank you for your visit today  We will call you with the results of the urine studies if they are positive for infection.  If negative, we can given you a cream to help reduce your symptoms

## 2016-07-01 NOTE — Progress Notes (Signed)
   CC: vaginal discharge HPI: [name] is a 65 y.o. woman with PMH noted below here for continuing vaginal  discharge.   Please see Problem List/A&P for the status of the patient's chronic medical problems   Past Medical History:  Diagnosis Date  . Anemia   . Arthritis   . Borderline personality disorder   . CAD (coronary artery disease)    Catherization 11/18/09>nonobstructive CAD , normal LV function, EF 65%  . Cervicalgia   . Depression   . Diabetes mellitus   . Diabetes mellitus, type II (HCC)   . Diabetic gastroparesis (HCC)   . Diastolic CHF (HCC)    Grade I, on Echo 06/2013, EF 55-60%  . GERD (gastroesophageal reflux disease)   . Gout   . Headache(784.0)   . Health maintenance examination    Colonoscopy 2009> normal; Diabetic eye exam 12/2008. No diabetic retinopathy; Mammogram 11/10> No evidence of malignancy, DXA 03/14/13 : normal  . Hyperlipidemia   . Hypertension   . Iliotibial band syndrome   . Low back pain syndrome   . Nonorganic psychosis   . Peripheral neuropathy (HCC)   . Right carpal tunnel syndrome 09/13/2014  . SBO (small bowel obstruction) (HCC) 01/09/2013  . Sleep apnea    does not wear CPAP   . Vitamin B12 deficiency   . Vitamin D deficiency   . Wears dentures    upper  . Wears glasses     Review of Systems: Denies fevers, chills, fatigue Denies cough, SOB Denies n/v/ Has vaginal discharge- brown. Denies hematuria or bleeding. Denies being sexually active. Has dyspareunia. Denies urinary frequency or urgency.   Physical Exam: Vitals:   07/01/16 0901  BP: (!) 121/54  Pulse: 72  Temp: 98.4 F (36.9 C)  TempSrc: Oral  SpO2: 97%  Weight: 191 lb (86.6 kg)  Height: 5\' 5"  (1.651 m)    General: A&O, in NAD CV: RRR, normal s1, s2, no m/r/g,  Resp: equal and symmetric breath sounds, no wheezing or crackles  Abdomen: soft, nontender, nondistended, +BS  GU: A pelvic exam/wet prep was attempted 2 times with 2 speculums in the presence  of chaperone [name] but unable to perform due to significant patient discomfort.   Assessment & Plan:   See encounters tab for problem based medical decision making. Patient discussed with Dr. Criselda PeachesMullen

## 2016-07-02 LAB — URINALYSIS, ROUTINE W REFLEX MICROSCOPIC
Bilirubin, UA: NEGATIVE
GLUCOSE, UA: NEGATIVE
Ketones, UA: NEGATIVE
Leukocytes, UA: NEGATIVE
NITRITE UA: NEGATIVE
RBC, UA: NEGATIVE
Specific Gravity, UA: 1.018 (ref 1.005–1.030)
Urobilinogen, Ur: 0.2 mg/dL (ref 0.2–1.0)
pH, UA: 6 (ref 5.0–7.5)

## 2016-07-02 LAB — MICROSCOPIC EXAMINATION: Epithelial Cells (non renal): >10 /hpf — AB (ref 0–10)

## 2016-07-02 LAB — URINE CYTOLOGY ANCILLARY ONLY
Chlamydia: NEGATIVE
NEISSERIA GONORRHEA: NEGATIVE
Trichomonas: NEGATIVE

## 2016-07-02 LAB — SPECIMEN STATUS REPORT

## 2016-07-05 MED ORDER — FLUCONAZOLE 150 MG PO TABS: 150.0000 mg | ORAL_TABLET | Freq: Every day | ORAL | 0 refills | Status: DC

## 2016-07-05 NOTE — Addendum Note (Signed)
Addended by: Deneise LeverSARAIYA, Zerah Hilyer A on: 07/05/2016 01:39 PM   Modules accepted: Orders, SmartSet

## 2016-07-05 NOTE — Progress Notes (Signed)
Internal Medicine Clinic Attending  Case discussed with Dr. Saraiya soon after the resident saw the patient.  We reviewed the resident's history and exam and pertinent patient test results.  I agree with the assessment, diagnosis, and plan of care documented in the resident's note.  

## 2016-07-06 ENCOUNTER — Other Ambulatory Visit: Payer: Self-pay | Admitting: Internal Medicine

## 2016-07-06 DIAGNOSIS — IMO0002 Reserved for concepts with insufficient information to code with codable children: Secondary | ICD-10-CM

## 2016-07-06 DIAGNOSIS — E1165 Type 2 diabetes mellitus with hyperglycemia: Secondary | ICD-10-CM

## 2016-07-06 DIAGNOSIS — Z794 Long term (current) use of insulin: Principal | ICD-10-CM

## 2016-07-07 LAB — URINE CYTOLOGY ANCILLARY ONLY
Bacterial vaginitis: NEGATIVE
CANDIDA VAGINITIS: POSITIVE — AB

## 2016-07-13 ENCOUNTER — Ambulatory Visit (HOSPITAL_COMMUNITY): Payer: Self-pay | Admitting: Clinical

## 2016-07-20 ENCOUNTER — Ambulatory Visit (INDEPENDENT_AMBULATORY_CARE_PROVIDER_SITE_OTHER): Payer: Self-pay | Admitting: Clinical

## 2016-07-20 DIAGNOSIS — F331 Major depressive disorder, recurrent, moderate: Secondary | ICD-10-CM

## 2016-07-20 DIAGNOSIS — F411 Generalized anxiety disorder: Secondary | ICD-10-CM

## 2016-07-20 NOTE — Progress Notes (Signed)
   THERAPIST PROGRESS NOTE  Session Time: 10:00 - 10:30  Participation Level: Active  Behavioral Response: CasualAlertNA  Type of Therapy: Individual Therapy  Treatment Goals addressed: Improve Psychiatric Symptoms, Calming Skills, Emotional Regulation Skills,   Interventions: Motivational Interviewing, CBT, Grounding & Mindfulness Techniques  Summary: [name] is a 65 y.o. female who presents with Major Depressive Disorder, Generalized Anxiety Disorder and Grief   Suicidal/Homicidal: No -without intent/plan  Therapist Response:  Zoriah met with clinician for an individual session. Roshaunda shared about her psychiatric symptoms, her current life events and her homework. Jahnessa shared that she was doing okay, however she is not sleeping as well as she would like. Client and clinician discussed calming techniques. Clinician asked open ended questions She said she had some partial days sometimes feeling depressed because she still gets sick after eating and sometimes feeling just fine. Clinician asked what made the difference. All shared that she thinks her medications are working well. She shared that she has been driving her grandson to school. She shared that it gives her a reason to get up in the morning. She shared that she has been making an effort to do more self care - dressing better, getting her nails done. She shared she has also been visiting her mother more which has decreased her stress. Linsy shared her homework - gratitude journal. She shared that while she doesn't put it on paper all the time but that the practice of looking for gratitude has been helpful. Alisa shared that her next goal is to go be[employer] to silver sneakers (exercise). Clinician asked open ended questions and Mariela identified her motivations (socialization, improved health, and mental health), She also identified things that would make her likelier to go. Jetaun agreed to continue practicing her gratitude journal and  challenging herself to socialize    Plan: Return again in 3-4 weeks.  Diagnosis:     Axis I: Major Depressive Disorder, Generalized Anxiety Disorder and Grief   Glenice Ciccone A, LCSW 07/20/2016

## 2016-07-27 ENCOUNTER — Encounter (HOSPITAL_COMMUNITY): Payer: Self-pay | Admitting: Clinical

## 2016-07-30 ENCOUNTER — Encounter: Payer: Self-pay | Admitting: Family Medicine

## 2016-07-30 ENCOUNTER — Ambulatory Visit (INDEPENDENT_AMBULATORY_CARE_PROVIDER_SITE_OTHER): Payer: Medicare Other | Admitting: Family Medicine

## 2016-07-30 ENCOUNTER — Other Ambulatory Visit: Payer: Self-pay | Admitting: Family Medicine

## 2016-07-30 VITALS — BP 164/62 | Ht 65.0 in | Wt 189.0 lb

## 2016-07-30 DIAGNOSIS — M25551 Pain in right hip: Secondary | ICD-10-CM

## 2016-07-30 DIAGNOSIS — M533 Sacrococcygeal disorders, not elsewhere classified: Secondary | ICD-10-CM

## 2016-07-30 DIAGNOSIS — G8929 Other chronic pain: Secondary | ICD-10-CM | POA: Diagnosis not present

## 2016-07-30 DIAGNOSIS — M7071 Other bursitis of hip, right hip: Secondary | ICD-10-CM

## 2016-07-30 NOTE — Patient Instructions (Signed)
[facility] [address] [place]  Jenel LucksRoberta will be calling you to set up your appt for you injections Her number is [phone]

## 2016-07-30 NOTE — Progress Notes (Signed)
   Subjective:  Patient ID: [name], female    DOB: June 01, 1951  Age: 65 y.o. MRN: [id number]  CC: Right Hip Pain Follow Up  HPI [name] is a 65 year old female who presents today with chronic right hip pain. Patient describes pain as constant pain that is localized to the right posterior hip. She reports that sitting makes the pain worse as she constantly has to shift. She states that she hasn't been able to go to church because of the extended sitting. She also reports that leaning to the left aggravates her right sided posterior hip pain. Standing and laying down provides mild relief. Patient reports that Tylenol does nothing for her pain so she stopped taking it. She received mild relief with the last injection. She says some days that the pain gets as bad as before the injection, but most days it is better than pre-injection.   Patient reports having low back pain as well, but feels it is unrelated to her right hip pain. She reports they rarely coincide and the pain "feels different." Patient reports having urge incontinence that she says she will be seeing her primary doctor next month for. Patient denies fecal incontinence, fever, sudden weight loss, numbness, or tingling.   ROS Review of Systems  Musculoskeletal:       Right Hip Pain      Low Back Pain  All other systems reviewed and are negative.   Objective:  BP (!) 164/62   Ht 5\' 5"  (1.651 m)   Wt 189 lb (85.7 kg)   BMI 31.45 kg/m    Physical Exam  Constitutional: She appears well-developed and well-nourished.  Musculoskeletal:  Right Leg: No lesions, deformities, or swelling. Mild tenderness to right posterior hip near the ischial spine. Normal ROM and strength.  Negative straight leg raise.  Left Leg: No lesions, deformities, or swelling. No tenderness.  Normal ROM and strength. Negative straight leg raise.      Assessment & Plan:   1. Right-sided ischial pain Patient reported some relief with last injection.  Discussed with patient the options we have going forward. We think it would be best to send her to IR to get an Ischial Bursitis and Sacroiliac Joint Injection. If patient does not get relief from these two injections, informed patient to schedule an appointment and we will discuss more about next steps at that appointment. Patient agreed with plan.  Ordered: - Ambulatory referral to Interventional Radiology  2. Chronic right SI joint pain Patient reported some relief with last injection. Discussed with patient the options we have going forward. We think it would be best to send her to IR to get an Ischial Bursitis and Sacroiliac Joint Injection. If patient does not get relief from these two injections, informed patient to schedule an appointment and we will discuss more about next steps at that appointment. Patient agreed with plan.  Ordered: - Ambulatory referral to Interventional Radiology  Follow-up: No Follow-up on file.   [name], Medical Student

## 2016-07-31 NOTE — Assessment & Plan Note (Signed)
She had some slight improvement with ultrasound guided CSI. I am going to set her up [facility] I also want them to look at her SI joint as there may be some component of sacroiliac dysfucntion.

## 2016-08-03 ENCOUNTER — Ambulatory Visit (INDEPENDENT_AMBULATORY_CARE_PROVIDER_SITE_OTHER): Payer: Self-pay | Admitting: Clinical

## 2016-08-03 DIAGNOSIS — F4320 Adjustment disorder, unspecified: Secondary | ICD-10-CM

## 2016-08-03 DIAGNOSIS — F331 Major depressive disorder, recurrent, moderate: Secondary | ICD-10-CM

## 2016-08-03 DIAGNOSIS — F411 Generalized anxiety disorder: Secondary | ICD-10-CM

## 2016-08-03 DIAGNOSIS — F4321 Adjustment disorder with depressed mood: Secondary | ICD-10-CM

## 2016-08-03 NOTE — Progress Notes (Signed)
   THERAPIST PROGRESS NOTE  Session Time: 10:00 -10:30  Participation Level: Active  Behavioral Response: CasualAlertDepressed  Type of Therapy: Individual Therapy  Treatment Goals addressed: Improve Psychiatric Symptoms, Calming Skills, Emotional Regulation Skills,   Interventions: Motivational Interviewing, CBT,   Summary: [name] is a 65 y.o. female who presents with Major Depressive Disorder, Generalized Anxiety Disorder and Grief   Suicidal/Homicidal: No -without intent/plan  Therapist Response:  [name] met with clinician for an individual session. [name] shared about her psychiatric symptoms and her current life events. Clinician asked open ended questions and [name] shared that she has been depressed. She shared that she has not been sleeping that well is been up and down all night. He shared that she is having pain in her back and her hip which makes her less mobile and increases her depression.  [name] shared that she was able to do some positive things which helped her mood. She shared that she took her grandson to homecoming at [employer]. she shared that she had decided to join [employer] she has [facility] [employer] client and clinician discussed some of her thoughts and emotions. [name] identified her most unwanted thought. Clinician asked open ended questions and [name] identified the evidence for and against the negative thought. [name] was then able to formulate healthier alternative thoughts. [name] left session early because she was experiencing a lot of physical pain with her hip and back. [name] agreed to continue keeping her gratitude journal and to practice her grounding techniques.      Plan: Return again in 1-2 weeks.  Diagnosis:     Axis I: Major Depressive Disorder, Generalized Anxiety Disorder and Grief    [name] A, LCSW 08/03/2016

## 2016-08-06 ENCOUNTER — Telehealth: Payer: Self-pay

## 2016-08-10 ENCOUNTER — Ambulatory Visit
Admission: RE | Admit: 2016-08-10 | Discharge: 2016-08-10 | Disposition: A | Discharge status: Home / Self Care | Payer: Medicare Other | Source: Ambulatory Visit | Attending: Family Medicine | Admitting: Family Medicine

## 2016-08-10 ENCOUNTER — Encounter (HOSPITAL_COMMUNITY): Payer: Self-pay | Admitting: Clinical

## 2016-08-10 DIAGNOSIS — M25551 Pain in right hip: Secondary | ICD-10-CM

## 2016-08-10 MED ORDER — METHYLPREDNISOLONE ACETATE 40 MG/ML INJ SUSP (RADIOLOG
120.0000 mg | Freq: Once | INTRAMUSCULAR | Status: AC
  Administered 2016-08-10: 120 mg via INTRAMUSCULAR

## 2016-08-10 MED ORDER — IOPAMIDOL (ISOVUE-M 200) INJECTION 41%
1.0000 mL | Freq: Once | INTRAMUSCULAR | Status: AC
  Administered 2016-08-10: 1 mL

## 2016-08-14 ENCOUNTER — Other Ambulatory Visit: Payer: Self-pay | Admitting: Internal Medicine

## 2016-08-14 DIAGNOSIS — I10 Essential (primary) hypertension: Secondary | ICD-10-CM

## 2016-08-17 ENCOUNTER — Ambulatory Visit (INDEPENDENT_AMBULATORY_CARE_PROVIDER_SITE_OTHER): Payer: Self-pay | Admitting: Clinical

## 2016-08-17 DIAGNOSIS — F411 Generalized anxiety disorder: Secondary | ICD-10-CM

## 2016-08-17 DIAGNOSIS — F4320 Adjustment disorder, unspecified: Secondary | ICD-10-CM

## 2016-08-17 DIAGNOSIS — F4321 Adjustment disorder with depressed mood: Secondary | ICD-10-CM

## 2016-08-17 DIAGNOSIS — F331 Major depressive disorder, recurrent, moderate: Secondary | ICD-10-CM

## 2016-08-17 NOTE — Progress Notes (Signed)
   THERAPIST PROGRESS NOTE  Session Time: 10:03 - 10:58  Participation Level: Active  Behavioral Response: CasualAlertNA  Type of Therapy: Individual Therapy  Treatment Goals addressed: Improve Psychiatric Symptoms, Emotional Regulation Skills,   Interventions: Motivational Interviewing, CBT,   Summary: [name] is a 65 y.o. female who presents with Major Depressive Disorder, Generalized Anxiety Disorder and Grief   Suicidal/Homicidal: No -without intent/plan  Therapist Response:  [name] met with clinician for an individual session. [name] shared about her psychiatric symptoms, her current life events and her homework. [name] shared that she has been doing "okay" most days since her  last session. She stated that she has been having pain in her back. She stated that she received a shot for her pain but it didn't work and her back is in a lot of pain. She shared that when her pain increases her depression increases. She shared that she also feels nausea.  She shared that she is going back to be doctor at the beginning of the week Clinician asked open ended questions and [name] discussed her successes and challenges using her skills she shared that prior to a few days ago she was doing well. She shared that she has been continuing to make an effort to socialize more she continues to make an effort to challenge her negative thoughts and to attend silver slippers. She shared that she had a few challenges which client and clinician discussed. Clinician asked open ended questions and [name] was able to identify a possible solutions. [name] shared that the pain was most difficult part because besides hurting it makes her less  Mobile. Client and clinician discussed using her guided meditation or focusing her attention on something as a way of distracting her from pain and also improve her emotions.    Plan: Return again in 1-2 weeks.  Diagnosis:     Axis I: Major Depressive Disorder, Generalized  Anxiety Disorder and Grief   Toni Hoffmeister A, LCSW 08/17/2016

## 2016-08-18 ENCOUNTER — Other Ambulatory Visit: Payer: Self-pay | Admitting: Internal Medicine

## 2016-08-18 DIAGNOSIS — I10 Essential (primary) hypertension: Secondary | ICD-10-CM

## 2016-08-19 ENCOUNTER — Encounter (HOSPITAL_COMMUNITY): Payer: Self-pay | Admitting: Clinical

## 2016-08-24 ENCOUNTER — Ambulatory Visit
Admission: RE | Admit: 2016-08-24 | Discharge: 2016-08-24 | Disposition: A | Discharge status: Home / Self Care | Payer: Medicare Other | Source: Ambulatory Visit | Attending: Family Medicine | Admitting: Family Medicine

## 2016-08-24 DIAGNOSIS — M533 Sacrococcygeal disorders, not elsewhere classified: Secondary | ICD-10-CM

## 2016-08-24 DIAGNOSIS — G894 Chronic pain syndrome: Secondary | ICD-10-CM | POA: Diagnosis not present

## 2016-08-24 DIAGNOSIS — M5136 Other intervertebral disc degeneration, lumbar region: Secondary | ICD-10-CM | POA: Diagnosis not present

## 2016-08-24 MED ORDER — IOPAMIDOL (ISOVUE-M 200) INJECTION 41%
1.0000 mL | Freq: Once | INTRAMUSCULAR | Status: AC
  Administered 2016-08-24: 1 mL via INTRA_ARTICULAR

## 2016-08-24 MED ORDER — METHYLPREDNISOLONE ACETATE 40 MG/ML INJ SUSP (RADIOLOG
120.0000 mg | Freq: Once | INTRAMUSCULAR | Status: AC
  Administered 2016-08-24: 120 mg via INTRA_ARTICULAR

## 2016-08-25 ENCOUNTER — Ambulatory Visit (INDEPENDENT_AMBULATORY_CARE_PROVIDER_SITE_OTHER): Payer: Medicare Other | Admitting: Internal Medicine

## 2016-08-25 VITALS — BP 145/60 | HR 73 | Temp 97.6°F | Wt 188.1 lb

## 2016-08-25 DIAGNOSIS — E119 Type 2 diabetes mellitus without complications: Secondary | ICD-10-CM

## 2016-08-25 DIAGNOSIS — Z794 Long term (current) use of insulin: Secondary | ICD-10-CM | POA: Diagnosis not present

## 2016-08-25 DIAGNOSIS — E1122 Type 2 diabetes mellitus with diabetic chronic kidney disease: Secondary | ICD-10-CM

## 2016-08-25 DIAGNOSIS — E11649 Type 2 diabetes mellitus with hypoglycemia without coma: Secondary | ICD-10-CM | POA: Diagnosis not present

## 2016-08-25 DIAGNOSIS — N189 Chronic kidney disease, unspecified: Secondary | ICD-10-CM | POA: Diagnosis not present

## 2016-08-25 DIAGNOSIS — Z87891 Personal history of nicotine dependence: Secondary | ICD-10-CM

## 2016-08-25 DIAGNOSIS — I129 Hypertensive chronic kidney disease with stage 1 through stage 4 chronic kidney disease, or unspecified chronic kidney disease: Secondary | ICD-10-CM | POA: Diagnosis not present

## 2016-08-25 DIAGNOSIS — Z79899 Other long term (current) drug therapy: Secondary | ICD-10-CM

## 2016-08-25 DIAGNOSIS — I10 Essential (primary) hypertension: Secondary | ICD-10-CM

## 2016-08-25 LAB — GLUCOSE, CAPILLARY
Glucose-Capillary: 64 mg/dL — ABNORMAL LOW (ref 65–99)
Glucose-Capillary: 69 mg/dL (ref 65–99)

## 2016-08-25 LAB — POCT GLYCOSYLATED HEMOGLOBIN (HGB A1C): Hemoglobin A1C: 8.1

## 2016-08-25 MED ORDER — METFORMIN HCL 500 MG PO TABS: 500.0000 mg | ORAL_TABLET | Freq: Every day | ORAL | 11 refills | Status: DC

## 2016-08-25 MED ORDER — METOPROLOL SUCCINATE ER 50 MG PO TB24: 50.0000 mg | ORAL_TABLET | Freq: Every morning | ORAL | 3 refills | Status: DC

## 2016-08-25 NOTE — Patient Instructions (Signed)
STOP taking humalog.   Start taking metformin 500mg  daily.   I have increased your metoprolol to 50mg  daily.   If your blood sugar is less than 70 drink juicy and eat candy. Make sure to check your sugar again to ensure it went back up.

## 2016-08-25 NOTE — Progress Notes (Signed)
   CC: Diabetes follow-up  HPI:  [name] is a 65 y.o. with past medical history outlined below who presents to clinic for diabetes follow-up. Please see problem list for further details of current and chronic medical issues.  Past Medical History:  Diagnosis Date  . Anemia   . Arthritis   . Borderline personality disorder   . CAD (coronary artery disease)    Catherization 11/18/09>nonobstructive CAD , normal LV function, EF 65%  . Cervicalgia   . Depression   . Diabetes mellitus   . Diabetes mellitus, type II (HCC)   . Diabetic gastroparesis (HCC)   . Diastolic CHF (HCC)    Grade I, on Echo 06/2013, EF 55-60%  . GERD (gastroesophageal reflux disease)   . Gout   . Headache(784.0)   . Health maintenance examination    Colonoscopy 2009> normal; Diabetic eye exam 12/2008. No diabetic retinopathy; Mammogram 11/10> No evidence of malignancy, DXA 03/14/13 : normal  . Hyperlipidemia   . Hypertension   . Iliotibial band syndrome   . Low back pain syndrome   . Nonorganic psychosis   . Peripheral neuropathy (HCC)   . Right carpal tunnel syndrome 09/13/2014  . SBO (small bowel obstruction) 01/09/2013  . Sleep apnea    does not wear CPAP   . Vitamin B12 deficiency   . Vitamin D deficiency   . Wears dentures    upper  . Wears glasses     Review of Systems:  Denies polyuria polydipsia. Denies abdominal pain that is her baseline  Physical Exam:  Vitals:   08/25/16 1626  BP: (!) 145/60  Pulse: 73  Temp: 97.6 F (36.4 C)  TempSrc: Oral  SpO2: 100%  Weight: 188 lb 1.6 oz (85.3 kg)   General: NAD Lungs: CTAB, no wheezing, crackles Cardiac: RRR, no murmurs, rubs or gallops GI: soft, active bowel sounds, nontender to palpation Neuro: CN II-XII grossly intact Skin:  warm and dry  Assessment & Plan:   See Encounters Tab for problem based charting.  Patient discussed with Dr. Cyndie ChimeGranfortuna

## 2016-08-26 ENCOUNTER — Telehealth: Payer: Self-pay | Admitting: Dietician

## 2016-08-26 DIAGNOSIS — Z794 Long term (current) use of insulin: Principal | ICD-10-CM

## 2016-08-26 DIAGNOSIS — E119 Type 2 diabetes mellitus without complications: Secondary | ICD-10-CM

## 2016-08-26 NOTE — Telephone Encounter (Signed)
Spoke with patient while in office yesterday: she is no longer carb counting to use her meter to dose her mealtime insulin. She is taking a set amount of insulin with meals.  She requests shorter pen needles to take her insulin with; called pharmacy to find out what they are dispenseing as her current prescription is for the shortest pen needles.  Per pharmacy: she gets nano pen needles for her lantus solostar and a half inch (recommend 15/64")  syringe for her Humalog/Novolg vial to take at her meal times-    Per Dr. Roxan Hockeyruong's note yesterday, Ms [name] has been taken off Humalog.   Encouraged <s. [name] to make an appointment with RD, CDE in next few months for her annual check up. Her last visit for MNT or DSMT was 2016. .Marland Kitchen

## 2016-08-27 ENCOUNTER — Telehealth: Payer: Self-pay | Admitting: Dietician

## 2016-08-27 NOTE — Progress Notes (Signed)
Medicine attending: Medical history, presenting problems, physical findings, and medications, reviewed with resident physician Dr [name] Truong on the day of the patient visit and I concur with her evaluation and management plan. 

## 2016-08-27 NOTE — Assessment & Plan Note (Signed)
Assessment: A1c today is 8.1 improved from 14.3 in July of this year. Her CBG was 64 on clinic arrival, she was given a cup or issues.  she is onHumalog 5 units with meals, Lantus 51 units at night and Januvia 50 units daily. She reports her CBG was in the low 100s this morning. She ate breakfast and lunch and took 5 units of Humalog with each meal. Her glucometer report ranges from 40-265. She had a reading of 54 at 2:27 PM and 40 at 11:02 PM. She states that she knows to drink juice or eat candy to correct hypoglycemia. She feels that these low readings were due to her inconsistent diet when she ate very little. Currently she feels well and denies any hypoglycemic symptoms.  Plan: Due to patient's fluctuating diabetes control will DC mealtime coverage. Start metformin 500 mg daily continue Januvia 50 mg which is limited by her CK D, and continue Lantus 51 units. Repeat CBG in clinic after patient drank half of her orange juice was 69. She reports feeling at her baseline. Follow-up in one month with glucometer report

## 2016-08-27 NOTE — Telephone Encounter (Signed)
[facility] Dan[facility] Pentonru[facility]. Remove syringes from medication list

## 2016-08-27 NOTE — Assessment & Plan Note (Signed)
Assessment: Blood pressure is 145/60 which is elevated.  Plan: Increase metoprolol from 25 mg to 50 mg daily. Continue losartan 100 mg daily. Follow-up in one month for blood pressure check. Can consider starting on Norvasc 5 mg if still elevated.

## 2016-09-05 NOTE — Telephone Encounter (Signed)
Unable to reach patient.

## 2016-09-09 ENCOUNTER — Ambulatory Visit (INDEPENDENT_AMBULATORY_CARE_PROVIDER_SITE_OTHER): Payer: 59 | Admitting: Clinical

## 2016-09-09 DIAGNOSIS — F331 Major depressive disorder, recurrent, moderate: Secondary | ICD-10-CM

## 2016-09-09 DIAGNOSIS — F411 Generalized anxiety disorder: Secondary | ICD-10-CM

## 2016-09-09 DIAGNOSIS — Z961 Presence of intraocular lens: Secondary | ICD-10-CM | POA: Diagnosis not present

## 2016-09-09 DIAGNOSIS — H10413 Chronic giant papillary conjunctivitis, bilateral: Secondary | ICD-10-CM | POA: Diagnosis not present

## 2016-09-09 DIAGNOSIS — H04123 Dry eye syndrome of bilateral lacrimal glands: Secondary | ICD-10-CM | POA: Diagnosis not present

## 2016-09-09 NOTE — Progress Notes (Signed)
   THERAPIST PROGRESS NOTE  Session Time: 12:00 -12:55  Participation Level: Active  Behavioral Response: CasualAlertNA  Type of Therapy: Individual Therapy  Treatment Goals addressed: Improve Psychiatric Symptoms, Calming Skills, Emotional Regulation Skills, Interpersonal Relationship Skills  Interventions: Motivational Interviewing, CBT,   Summary: [name] is a 64 y.o. female who presents with Major Depressive Disorder, Generalized Anxiety Disorder and Grief   Suicidal/Homicidal: No -without intent/plan  Therapist Response:  Anamika met with clinician for an individual session. [name] shared about her psychiatric symptoms and  her current life events. [name] shared that she feels like she is improving. She stated that she has been doing the things discussed in therapy and has found them helpful. She stated that she does still find herself depressed on days and lashing out. Clinician asked open ended questions and [name] shared about the days that she feels depressed. She shared that she still finds herself ruminating sometimes about her daughters death and about her health issues. When asked she identified the ways she could interrupt these thoughts and refocus on the things that bring joy to her life.[name] shared about a family situation that causes her to be upset and consume her thoughts and time. Clinician asked open ended questions and [name] shared her negative thoughts and emotions. [name] shared the evidence for and against the thoughts. [name] was able to formulate healthier thoughts. She had the insight she could set boundaries that were appropriate for her and allow others to feel and do what was appropriate for them. Clinician asked open ended questions and [name] Shared about her "good days." She shared about working to eat better and she has joined [employer] again so that she is exercising more and interacting with others outside of her house.      Plan: Return again in 3-4  weeks.  Diagnosis:     Axis I: Major Depressive Disorder, Generalized Anxiety Disorder and Grief   , A, LCSW 09/09/2016  

## 2016-09-16 ENCOUNTER — Encounter (HOSPITAL_COMMUNITY): Payer: Self-pay | Admitting: Clinical

## 2016-09-29 ENCOUNTER — Ambulatory Visit (HOSPITAL_COMMUNITY): Payer: Self-pay | Admitting: Clinical

## 2016-09-29 ENCOUNTER — Encounter: Payer: Self-pay | Admitting: Internal Medicine

## 2016-10-13 ENCOUNTER — Ambulatory Visit (INDEPENDENT_AMBULATORY_CARE_PROVIDER_SITE_OTHER): Payer: Medicare Other | Admitting: Internal Medicine

## 2016-10-13 VITALS — BP 125/53 | HR 71 | Temp 97.5°F | Ht 65.0 in | Wt 184.6 lb

## 2016-10-13 DIAGNOSIS — I10 Essential (primary) hypertension: Secondary | ICD-10-CM

## 2016-10-13 DIAGNOSIS — E785 Hyperlipidemia, unspecified: Secondary | ICD-10-CM | POA: Diagnosis not present

## 2016-10-13 DIAGNOSIS — N898 Other specified noninflammatory disorders of vagina: Secondary | ICD-10-CM

## 2016-10-13 DIAGNOSIS — Z87891 Personal history of nicotine dependence: Secondary | ICD-10-CM

## 2016-10-13 DIAGNOSIS — Z79899 Other long term (current) drug therapy: Secondary | ICD-10-CM

## 2016-10-13 DIAGNOSIS — Z794 Long term (current) use of insulin: Secondary | ICD-10-CM

## 2016-10-13 DIAGNOSIS — E119 Type 2 diabetes mellitus without complications: Secondary | ICD-10-CM

## 2016-10-13 MED ORDER — LOSARTAN POTASSIUM 100 MG PO TABS: 100.0000 mg | ORAL_TABLET | Freq: Every day | ORAL | 3 refills | Status: DC

## 2016-10-13 MED ORDER — METOPROLOL SUCCINATE ER 50 MG PO TB24: 50.0000 mg | ORAL_TABLET | Freq: Every morning | ORAL | 11 refills | Status: DC

## 2016-10-13 MED ORDER — ROSUVASTATIN CALCIUM 10 MG PO TABS: 10.0000 mg | ORAL_TABLET | Freq: Every day | ORAL | 11 refills | Status: DC

## 2016-10-13 NOTE — Progress Notes (Signed)
   CC: diabetes  HPI:  [name] is a 65 y.o. with PMHx as outlined below who presents to clinic for diabetes follow up. Please see problem list for further details of patient's chronic medical issues.   Past Medical History:  Diagnosis Date  . Anemia   . Arthritis   . Borderline personality disorder   . CAD (coronary artery disease)    Catherization 11/18/09>nonobstructive CAD , normal LV function, EF 65%  . Cervicalgia   . Depression   . Diabetes mellitus   . Diabetes mellitus, type II (HCC)   . Diabetic gastroparesis (HCC)   . Diastolic CHF (HCC)    Grade I, on Echo 06/2013, EF 55-60%  . GERD (gastroesophageal reflux disease)   . Gout   . Headache(784.0)   . Health maintenance examination    Colonoscopy 2009> normal; Diabetic eye exam 12/2008. No diabetic retinopathy; Mammogram 11/10> No evidence of malignancy, DXA 03/14/13 : normal  . Hyperlipidemia   . Hypertension   . Iliotibial band syndrome   . Low back pain syndrome   . Nonorganic psychosis   . Peripheral neuropathy (HCC)   . Right carpal tunnel syndrome 09/13/2014  . SBO (small bowel obstruction) 01/09/2013  . Sleep apnea    does not wear CPAP   . Vitamin B12 deficiency   . Vitamin D deficiency   . Wears dentures    upper  . Wears glasses     Review of Systems:  Positive for white vaginal discharge and occasional dysuria. Denies vaginal itching and foul odor. Denies decreased appetite.   Physical Exam:  Physical Exam  Constitutional:  appears well-developed and well-nourished. No distress.  HENT:  Head: Normocephalic and atraumatic.  Nose: Nose normal.  Cardiovascular: Normal rate, regular rhythm and normal heart sounds.  Exam reveals no gallop and no friction rub.   No murmur heard. Pulmonary/Chest: Effort normal and breath sounds normal. No respiratory distress.  has no wheezes.no rales.  Abdominal: Soft. Bowel sounds are normal.  exhibits no distension. There is no tenderness. There is no rebound  and no guarding.  GU: Patient refused pelvic exam  Skin: Skin is warm and dry. No rash noted.  not diaphoretic. No erythema. No pallor.   Assessment & Plan:   See Encounters Tab for problem based charting.  Patient discussed with Dr. Heide SparkNarendra

## 2016-10-13 NOTE — Patient Instructions (Signed)
Start taking lantus 45 units daily.   Please schedule an appointment for a pelvic exam to further evaluate your symptoms.

## 2016-10-14 NOTE — Assessment & Plan Note (Signed)
Assessment: Patient's concomitant ranges from 64-168. She reports that she has worked on her diet and cut out sugar, screen her with her coffee, sodas. She is on Lantus 51 units daily, Januvia 50 mg daily and metformin 500 mg daily.  Plan: Decrease Lantus down to 45 units. Likely patient has improved on her diet and does not need 51 units daily.

## 2016-10-14 NOTE — Assessment & Plan Note (Addendum)
Assessment: Blood pressure today is 125/53. Well controlled.  Plan: Refilled losartan 100 mg daily. Continue metoprolol 50 mg daily and clonidine 0.3 mg patch

## 2016-10-14 NOTE — Assessment & Plan Note (Signed)
Stable. Refilled Crestor 10 mg daily.

## 2016-10-14 NOTE — Assessment & Plan Note (Signed)
Assessment: Patient states that she was treated with a 14 day course of Diflucan for recurrent yeast infection. However she states that her symptoms have not improved since then. She states she is having white discharge without any foul smelling odor. Denies vaginal itching. She has occasional dysuria. Denies any fevers. She is refusing to have a pelvic exam done today to further evaluate this discharge.  Plan: Patient to schedule an appointment for a pelvic exam.

## 2016-10-15 ENCOUNTER — Encounter: Payer: Self-pay | Admitting: Internal Medicine

## 2016-10-20 ENCOUNTER — Encounter (HOSPITAL_COMMUNITY): Payer: Self-pay | Admitting: Psychiatry

## 2016-10-20 ENCOUNTER — Ambulatory Visit (INDEPENDENT_AMBULATORY_CARE_PROVIDER_SITE_OTHER): Payer: Medicare Other | Admitting: Psychiatry

## 2016-10-20 VITALS — BP 118/60 | HR 72 | Ht 65.0 in | Wt 184.0 lb

## 2016-10-20 DIAGNOSIS — Z87891 Personal history of nicotine dependence: Secondary | ICD-10-CM

## 2016-10-20 DIAGNOSIS — Z79899 Other long term (current) drug therapy: Secondary | ICD-10-CM

## 2016-10-20 DIAGNOSIS — F325 Major depressive disorder, single episode, in full remission: Secondary | ICD-10-CM

## 2016-10-20 DIAGNOSIS — Z818 Family history of other mental and behavioral disorders: Secondary | ICD-10-CM

## 2016-10-20 DIAGNOSIS — Z7982 Long term (current) use of aspirin: Secondary | ICD-10-CM

## 2016-10-20 DIAGNOSIS — Z8249 Family history of ischemic heart disease and other diseases of the circulatory system: Secondary | ICD-10-CM

## 2016-10-20 DIAGNOSIS — Z833 Family history of diabetes mellitus: Secondary | ICD-10-CM | POA: Diagnosis not present

## 2016-10-20 DIAGNOSIS — Z9889 Other specified postprocedural states: Secondary | ICD-10-CM

## 2016-10-20 DIAGNOSIS — Z8261 Family history of arthritis: Secondary | ICD-10-CM

## 2016-10-20 MED ORDER — BUPROPION HCL ER (XL) 150 MG PO TB24: 450.0000 mg | ORAL_TABLET | Freq: Every day | ORAL | 1 refills | Status: DC

## 2016-10-20 MED ORDER — SERTRALINE HCL 100 MG PO TABS: ORAL_TABLET | ORAL | 2 refills | Status: DC

## 2016-10-20 NOTE — Progress Notes (Signed)
Patient ID: [name], female   DOB: February 17, 1951, 65 y.o.   MRN: [id number] [facility] MD Progress Note  10/20/2016 2:00 PM Lilibeth Opie  MRN:  [id number] Subjective:   Better  Happy Principal Problem: major depression , recurrent , mild Diagnosis:   Major depression, recurrent, mild   Today the patient is doing well. She was seen alone and she came on time. The patient is active she doing the things. He's going to go back to church. She's going only on Sunday but she's getting get back to Bible study soon. The patient is exercising. The patient and her husband transport therefore-year-old great grandson back and forth to school. She wishes to see more often but the child's mother is resistant. The patient takes her Zoloft and Wellbutrin as prescribed. She's done well coming off of Klonopin. The patient enjoys television. She has her own TV in her own room she doesn't really. She enjoys getting on the computer and getting on face book. She is sleeping and eating very well. She's lost some weight for her efforts. The patient denies use of alcohol. She is no evidence of psychosis. She's got good energy. She denies any chest pain or shortness of breath. She recently saw her primary care doctor and her blood pressure and congestive heart failure very well controlled at this time. Her biggest issue seems to be her husband who is a chronic alcoholic. He is intoxicated every day. She is very much bothered by this. She had to children one a daughter who died from diabetes last year. She has another son who presently is incarcerated. Her grandson is also incarcerated. Nonetheless there are some family on her father's side as well as her own mother at an mother-in-law who are alive and well. She saw them all at the Christmas dinner she put on at around home. She had a good Christmas. The patient continues in psychotherapy in the setting. She seen every 3 weeks.  Past Psychiatric History:   Past Medical History:  Past  Medical History:  Diagnosis Date  . Anemia   . Arthritis   . Borderline personality disorder   . CAD (coronary artery disease)    Catherization 11/18/09>nonobstructive CAD , normal LV function, EF 65%  . Cervicalgia   . Depression   . Diabetes mellitus   . Diabetes mellitus, type II (HCC)   . Diabetic gastroparesis (HCC)   . Diastolic CHF (HCC)    Grade I, on Echo 06/2013, EF 55-60%  . GERD (gastroesophageal reflux disease)   . Gout   . Headache(784.0)   . Health maintenance examination    Colonoscopy 2009> normal; Diabetic eye exam 12/2008. No diabetic retinopathy; Mammogram 11/10> No evidence of malignancy, DXA 03/14/13 : normal  . Hyperlipidemia   . Hypertension   . Iliotibial band syndrome   . Low back pain syndrome   . Nonorganic psychosis   . Peripheral neuropathy (HCC)   . Right carpal tunnel syndrome 09/13/2014  . SBO (small bowel obstruction) 01/09/2013  . Sleep apnea    does not wear CPAP   . Vitamin B12 deficiency   . Vitamin D deficiency   . Wears dentures    upper  . Wears glasses     Past Surgical History:  Procedure Laterality Date  . ABDOMINAL HYSTERECTOMY    . APPENDECTOMY    . BOWEL RESECTION N/A 01/10/2013   Procedure: SMALL BOWEL RESECTION;  Surgeon: Lodema Pilot, DO;  Location: [facility];  Service: General;  Laterality: N/A;  .  CARPAL TUNNEL RELEASE Right 09/13/2014   Procedure: RIGHT WRIST CARPAL TUNNEL RELEASE;  Surgeon: Eulas Post, MD;  Location: [facility];  Service: Orthopedics;  Laterality: Right;  . CARPAL TUNNEL RELEASE Left 02/28/2015   Procedure: LEFT CARPAL TUNNEL RELEASE;  Surgeon: Teryl Lucy, MD;  Location: [facility];  Service: Orthopedics;  Laterality: Left;  . HAND SURGERY  1992   rt  . HERNIA REPAIR  03/01/14   Lap incisional hernia repair w/mesh  . INCISIONAL HERNIA REPAIR N/A 03/01/2014   Procedure: LAPAROSCOPIC INCISIONAL HERNIA;  Surgeon: Axel Filler, MD;  Location: [facility];  Service: General;   Laterality: N/A;  . INSERTION OF MESH N/A 03/01/2014   Procedure: INSERTION OF MESH;  Surgeon: Axel Filler, MD;  Location: [facility];  Service: General;  Laterality: N/A;  . LAPAROTOMY N/A 01/10/2013   Procedure: EXPLORATORY LAPAROTOMY;  Surgeon: Lodema Pilot, DO;  Location: [facility];  Service: General;  Laterality: N/A;  . LYSIS OF ADHESION N/A 01/10/2013   Procedure: LYSIS OF ADHESION;  Surgeon: Lodema Pilot, DO;  Location: [facility];  Service: General;  Laterality: N/A;  . STERIOD INJECTION Left 09/13/2014   Procedure: STEROID INJECTION LEFT HAND;  Surgeon: Eulas Post, MD;  Location: [facility];  Service: Orthopedics;  Laterality: Left;   Family History:  Family History  Problem Relation Age of Onset  . Diabetes Mother   . Hypertension Mother   . Hyperlipidemia Mother   . Arthritis Mother   . Depression Mother   . Heart disease Father   . Diabetes Brother   . Heart disease Brother   . Heart disease Paternal Grandmother   . Diabetes Brother    Family Psychiatric  History:  Social History:  History  Alcohol Use No     History  Drug Use No    Social History   Social History  . Marital status: Married    Spouse name: N/A  . Number of children: N/A  . Years of education: N/A   Social History Main Topics  . Smoking status: Former Smoker    Types: Cigarettes    Quit date: 10/25/2002  . Smokeless tobacco: Never Used  . Alcohol use No  . Drug use: No  . Sexual activity: Not Currently   Other Topics Concern  . None   Social History Narrative  . None   Additional Social History:                         Sleep: Good  Appetite:  Good  Current Medications: Current Outpatient Prescriptions  Medication Sig Dispense Refill  . ACCU-CHEK AVIVA PLUS test strip TEST UPTO 4 TIMES DAILY 125 each 5  . ACCU-CHEK FASTCLIX LANCETS MISC TEST five times a day 102 each 11  . acetaminophen (TYLENOL) 500 MG tablet Take 325 mg by mouth.    Marland Kitchen albuterol (PROVENTIL  HFA;VENTOLIN HFA) 108 (90 BASE) MCG/ACT inhaler Inhale 2 puffs into the lungs every 6 (six) hours as needed for wheezing or shortness of breath. (Patient not taking: Reported on 09/09/2016) 1 Inhaler 2  . AMITIZA 24 MCG capsule     . aspirin 81 MG chewable tablet Chew 81 mg by mouth daily.    Marland Kitchen buPROPion (WELLBUTRIN XL) 150 MG 24 hr tablet Take 3 tablets (450 mg total) by mouth daily. 270 tablet 1  . cloNIDine (CATAPRES - DOSED IN MG/24 HR) 0.3 mg/24hr patch Place onto the skin.    Marland Kitchen  dexlansoprazole (DEXILANT) 60 MG capsule Take 1 capsule (60 mg total) by mouth daily. For reflux 30 capsule   . diclofenac sodium (VOLTAREN) 1 % GEL Apply 2 g topically 4 (four) times daily. 3 Tube 1  . Insulin Glargine (LANTUS SOLOSTAR) 100 UNIT/ML Solostar Pen Inject 45 Units into the skin daily.    . Insulin Pen Needle (BD PEN NEEDLE NANO U/F) 32G X 4 MM MISC Use to inject insulin into the skin (Patient not taking: Reported on 09/09/2016) 100 each 6  . JANUVIA 50 MG tablet take 1 tablet by mouth once daily 90 tablet 4  . ketorolac (ACULAR) 0.4 % SOLN instill 1 drop IN SURGICAL EYE four times a day STARTING SUNDAY BEFORE SURGERY  0  . LINZESS 145 MCG CAPS capsule Take 145 mcg by mouth daily.  1  . losartan (COZAAR) 100 MG tablet Take 1 tablet (100 mg total) by mouth daily. 90 tablet 3  . metFORMIN (GLUCOPHAGE) 500 MG tablet Take 1 tablet (500 mg total) by mouth daily with breakfast. 30 tablet 11  . metoprolol succinate (TOPROL-XL) 50 MG 24 hr tablet Take 1 tablet (50 mg total) by mouth every morning. 30 tablet 11  . ofloxacin (OCUFLOX) 0.3 % ophthalmic solution instill 1 drop IN SURGICAL EYE four times a day STARTING SUNDAY BEFORE SURGERY    . prednisoLONE acetate (PRED FORTE) 1 % ophthalmic suspension INSTILL 1 DROP IN SURGICAL EYE 4 TIMES A DAY,AFTER SURGERY    . promethazine (PHENERGAN) 25 MG tablet Take 25 mg by mouth.    . rosuvastatin (CRESTOR) 10 MG tablet Take 1 tablet (10 mg total) by mouth daily. 30 tablet  11  . sertraline (ZOLOFT) 100 MG tablet 2 qam 180 tablet 2  . sitaGLIPtin (JANUVIA) 50 MG tablet Take 50 mg by mouth.     No current facility-administered medications for this visit.     Lab Results: No results found. However, due to the size of the patient record, not all encounters were searched. Please check Results Review for a complete set of results.  Physical Findings: AIMS:  , ,  ,  ,    CIWA:    COWS:     Musculoskeletal: Strength & Muscle Tone: within normal limits Gait & Station: normal Patient leans: N/A  Psychiatric Specialty Exam: ROS  Blood pressure 118/60, pulse 72, height 5\' 5"  (1.651 m), weight 184 lb (83.5 kg).Body mass index is 30.62 kg/m.  General Appearance: Casual  Eye Contact::  Good  Speech:  Clear and Coherent  Volume:  Normal  Mood:  Euthymic  Affect:  Congruent  Thought Process:  Coherent  Orientation:  Full (Time, Place, and Person)  Thought Content:  WDL  Suicidal Thoughts:  No  Homicidal Thoughts:  No  Memory:  NA  Judgement:  Good  Insight:  Good  Psychomotor Activity:  Normal  Concentration:  Good  Recall:  Good  Fund of Knowledge:Good  Language: Good  Akathisia:  No  Handed:  Right  AIMS (if indicated):     Assets:  Desire for Improvement  ADL's:  Intact  Cognition: WNL  Sleep:      Treatment Plan Summary: 10/20/2016, 2:00 PM Today the patient is doing well. Her major problem is major depression. Is well-controlled with maximum dose Zoloft 200 mg and Wellbutrin 450 mg. The patient no longer needs to be on Klonopin and she is off of it. She denies any anxiety. Patient continue in psychotherapy where she seen approximately once a month.  The patient is not suicidal. She has no physical complaints. This patient to return to see me in 5 months or 30 minute visit.

## 2016-10-21 ENCOUNTER — Ambulatory Visit (INDEPENDENT_AMBULATORY_CARE_PROVIDER_SITE_OTHER): Payer: 59 | Admitting: Clinical

## 2016-10-21 DIAGNOSIS — F331 Major depressive disorder, recurrent, moderate: Secondary | ICD-10-CM

## 2016-10-21 DIAGNOSIS — F411 Generalized anxiety disorder: Secondary | ICD-10-CM

## 2016-10-21 DIAGNOSIS — F4320 Adjustment disorder, unspecified: Secondary | ICD-10-CM

## 2016-10-21 DIAGNOSIS — F4321 Adjustment disorder with depressed mood: Secondary | ICD-10-CM

## 2016-10-21 NOTE — Progress Notes (Signed)
   THERAPIST PROGRESS NOTE  Session Time: 9:58 - 10:55  Participation Level: Active  Behavioral Response: CasualAlertNA  Type of Therapy: Individual Therapy  Treatment Goals addressed: Improve Psychiatric Symptoms,  Emotional Regulation Skills, Interpersonal Relationship Skills  Interventions: Motivational Interviewing, CBT,   Summary: [name] is a 65 y.o. female who presents with Major Depressive Disorder, Generalized Anxiety Disorder and Grief   Suicidal/Homicidal: No -without intent/plan  Therapist Response:  [name] met with clinician for an individual session. [name] shared about her psychiatric symptoms and her current life events. [name] shared that she has been doing better for the most part. [name] looked healthy and laughed more easily than in the past. Clinician asked open ended questions and [name] shared that she has been putting the skills learned in therapy to use and has been enjoying the benefit. She shared that she has been working to choose each day to feel good regardless of others. She shared her efforts including socializing more, dressing happier, counting her gratitude, and using her grounding and mindfulness techniques. Clinician praised her for her good work. [name] shared some of the areas she continues to be challenged with. Clinician asked open ended questions and [name] identified the situations, her emotions and her negative thoughts. Clinician encouraged her to challenge the thoughts and consider alternative thoughts which she did. [name] had the insight that she is not responsible for others actions. She had the insight that she is only responsible for her own actions. Client and clinician agreed to discuss her decreasing session and her Graduating from therapy at next session.    Plan: Return again in 1-2 weeks.  Diagnosis:     Axis I: Major Depressive Disorder, Generalized Anxiety Disorder and Grief    [name] A, LCSW 10/21/2016

## 2016-10-22 ENCOUNTER — Encounter (HOSPITAL_COMMUNITY): Payer: Self-pay | Admitting: Clinical

## 2016-11-01 ENCOUNTER — Encounter: Payer: Self-pay | Admitting: Internal Medicine

## 2016-11-02 ENCOUNTER — Encounter: Payer: Self-pay | Admitting: Internal Medicine

## 2016-11-09 ENCOUNTER — Encounter: Payer: Self-pay | Admitting: Dietician

## 2016-11-11 ENCOUNTER — Ambulatory Visit (HOSPITAL_COMMUNITY): Payer: Self-pay | Admitting: Clinical

## 2016-11-15 ENCOUNTER — Telehealth: Payer: Self-pay | Admitting: Internal Medicine

## 2016-11-15 NOTE — Telephone Encounter (Signed)
APT. REMINDER CALL, LMTCB °

## 2016-11-16 ENCOUNTER — Encounter: Payer: Self-pay | Admitting: Dietician

## 2016-11-25 ENCOUNTER — Ambulatory Visit (INDEPENDENT_AMBULATORY_CARE_PROVIDER_SITE_OTHER): Payer: Medicare Other | Admitting: Internal Medicine

## 2016-11-25 ENCOUNTER — Encounter: Payer: Self-pay | Admitting: Internal Medicine

## 2016-11-25 ENCOUNTER — Other Ambulatory Visit (HOSPITAL_COMMUNITY)
Admission: RE | Admit: 2016-11-25 | Discharge: 2016-11-25 | Disposition: A | Discharge status: Home / Self Care | Payer: Medicare Other | Source: Ambulatory Visit | Attending: Internal Medicine | Admitting: Internal Medicine

## 2016-11-25 VITALS — BP 139/60 | HR 78 | Temp 98.1°F | Ht 65.0 in | Wt 179.8 lb

## 2016-11-25 DIAGNOSIS — S39012A Strain of muscle, fascia and tendon of lower back, initial encounter: Secondary | ICD-10-CM | POA: Diagnosis not present

## 2016-11-25 DIAGNOSIS — M25552 Pain in left hip: Secondary | ICD-10-CM | POA: Insufficient documentation

## 2016-11-25 DIAGNOSIS — N898 Other specified noninflammatory disorders of vagina: Secondary | ICD-10-CM | POA: Diagnosis present

## 2016-11-25 DIAGNOSIS — N952 Postmenopausal atrophic vaginitis: Secondary | ICD-10-CM

## 2016-11-25 DIAGNOSIS — X58XXXA Exposure to other specified factors, initial encounter: Secondary | ICD-10-CM | POA: Diagnosis not present

## 2016-11-25 MED ORDER — CYCLOBENZAPRINE HCL 5 MG PO TABS: 5.0000 mg | ORAL_TABLET | Freq: Three times a day (TID) | ORAL | 0 refills | Status: DC | PRN

## 2016-11-25 NOTE — Progress Notes (Signed)
Medicine attending: Medical history, presenting problems, physical findings, and medications, reviewed with resident physician Dr Alexa Burns on the day of the patient visit and I concur with her evaluation and management plan. 

## 2016-11-25 NOTE — Assessment & Plan Note (Addendum)
Patient also complains of chronic vaginal discomfort with discharge. Patient states that starting in summer 2017, she developed vaginal dryness, discomfort and discharge. Discharge is white, creamy- not clumpy, non-odorous. She denies vaginal itching. She has been treated twice for a yeast infection without improvement.  She denies dysuria. She is not sexually active. UA, BV, Trichomonas, chlamydia and gonorrhea negative in September. Pelvic exam shows atrophic vagina, white creamy thin discharge.   Plan: -Wet Prep -Consider referral to Ob/Gyn -Consider topical estrogen cream for vaginal dryness  ADDENDUM: -Wet prep positive for BV and Yeast -Will treat with Flagyl 500 mg BID x 7 days -Will treat for recurrent yeast infection (4 in one year) with Flagyl 150 mg Q72H for 3 doses followed by weekly Flagyl 150 mg for 6 months -Discussed with patient over the phone and she is in agreement

## 2016-11-25 NOTE — Progress Notes (Signed)
CC: Left hip pain and vaginal discomfort  HPI: [name] is a 66 y.o. female with PMHx of CAD, Chronic HFpEF, T2DM and CKD who presents to the clinic for complaint of vaginal discomfort and left hip pain.   Patient complains of a new left hip pain which started 2 weeks ago. Pain is located over her posterior superior left hip and is non-radiating. She describes it as catching, spasming, and tight. She will limp until it loosens up. She denies any recent injuries or falls. It is better with laying down, worse with movement. She has tried tylenol and medicated back patches without relief. She has a history of right sided sacroilitis and ischial bursitis, but states that this pain is different.   Patient also complains of chronic vaginal discomfort with discharge. Patient states that starting in summer 2017, she developed vaginal dryness, discomfort and discharge. Discharge is white, creamy- not clumpy, non-odorous. She denies vaginal itching. She has been treated twice for a yeast infection without improvement.  She denies dysuria. She is not sexually active. UA, BV, Trichomonas, chlamydia and gonorrhea negative in September.   Past Medical History:  Diagnosis Date  . Anemia   . Arthritis   . Borderline personality disorder   . CAD (coronary artery disease)    Catherization 11/18/09>nonobstructive CAD , normal LV function, EF 65%  . Cervicalgia   . Depression   . Diabetes mellitus   . Diabetes mellitus, type II (HCC)   . Diabetic gastroparesis (HCC)   . Diastolic CHF (HCC)    Grade I, on Echo 06/2013, EF 55-60%  . GERD (gastroesophageal reflux disease)   . Gout   . Headache(784.0)   . Health maintenance examination    Colonoscopy 2009> normal; Diabetic eye exam 12/2008. No diabetic retinopathy; Mammogram 11/10> No evidence of malignancy, DXA 03/14/13 : normal  . Hyperlipidemia   . Hypertension   . Iliotibial band syndrome   . Low back pain syndrome   . Nonorganic psychosis   .  Peripheral neuropathy (HCC)   . Right carpal tunnel syndrome 09/13/2014  . SBO (small bowel obstruction) 01/09/2013  . Sleep apnea    does not wear CPAP   . Vitamin B12 deficiency   . Vitamin D deficiency   . Wears dentures    upper  . Wears glasses     Review of Systems: Please see pertinent ROS reviewed in HPI and problem based charting.   Physical Exam: Vitals:   11/25/16 1509  BP: 139/60  Pulse: 78  Temp: 98.1 F (36.7 C)  TempSrc: Oral  SpO2: 99%  Weight: 179 lb 12.8 oz (81.6 kg)  Height: 5\' 5"  (1.651 m)   General: Vital signs reviewed.  Patient is well-developed and well-nourished, in no acute distress and cooperative with exam.  Cardiovascular: RRR, S1 normal, S2 normal, no murmurs, gallops, or rubs. Pulmonary/Chest: Clear to auscultation bilaterally, no wheezes, rales, or rhonchi. Abdominal: Soft, non-tender, non-distended, BS + MSK: tight musculature over left low back and superior buttock, mildly tender to palpation. Pelvic exam: VULVA: normal appearing vulva with no masses, tenderness or lesions, VAGINA: atrophic, vaginal discharge - white, creamy and thin, WET MOUNT done - results: pending, exam chaperoned by Toney Reilaisy, RN. Extremities: No lower extremity edema bilaterally,  pulses symmetric and intact bilaterally. No cyanosis or clubbing. Psychiatric: Normal mood and affect. speech and behavior is normal. Cognition and memory are normal.   Assessment & Plan:  See encounters tab for problem based medical decision making. Patient  discussed with Dr. Cyndie Chime

## 2016-11-25 NOTE — Assessment & Plan Note (Signed)
Patient complains of a new left hip pain which started 2 weeks ago. Pain is located over her posterior superior left hip and is non-radiating. She describes it as catching, spasming, and tight. She will limp until it loosens up. She denies any recent injuries or falls. It is better with laying down, worse with movement. She has tried tylenol and medicated back patches without relief. She has a history of right sided sacroilitis and ischial bursitis, but states that this pain is different. Exam shows mild tenderness and might musculature over left low back and superior left gluteal muscle.   Plan: -Flexeril 5 mg TID prn  -Tylenol prn -Voltaren gel -Warm compresses -If not improving, would consider sacroilitis

## 2016-11-25 NOTE — Patient Instructions (Signed)
For your hip pain, take cyclobenzaprine 5 mg three times a day as needed. This medication will make you sleepy and you cannot drive afterwards. Try to take it first at night to see how it makes you feel. Take tylenol as need for pain and try voltaren gel over the area as well.   For your vaginal pain, we will check for recurrent yeast today. If positive, we should refer you to Ob/Gyn for consideration of refractory yeast treatment. If negative, we can consider topical estrogen cream or referral to Ob/Gyn as well.

## 2016-11-26 ENCOUNTER — Other Ambulatory Visit: Payer: Self-pay | Admitting: Internal Medicine

## 2016-11-26 LAB — CERVICOVAGINAL ANCILLARY ONLY: WET PREP (BD AFFIRM): POSITIVE — AB

## 2016-11-26 MED ORDER — FLUCONAZOLE 150 MG PO TABS: ORAL_TABLET | ORAL | 0 refills | Status: DC

## 2016-11-26 MED ORDER — METRONIDAZOLE 500 MG PO TABS: 500.0000 mg | ORAL_TABLET | Freq: Two times a day (BID) | ORAL | 0 refills | Status: DC

## 2016-12-01 ENCOUNTER — Encounter: Payer: Self-pay | Admitting: Internal Medicine

## 2016-12-01 ENCOUNTER — Ambulatory Visit (INDEPENDENT_AMBULATORY_CARE_PROVIDER_SITE_OTHER): Payer: Medicare Other | Admitting: Pulmonary Disease

## 2016-12-01 VITALS — BP 125/60 | HR 77 | Temp 98.0°F | Ht 65.0 in | Wt 181.4 lb

## 2016-12-01 DIAGNOSIS — M25552 Pain in left hip: Secondary | ICD-10-CM

## 2016-12-01 MED ORDER — CYCLOBENZAPRINE HCL 5 MG PO TABS: 5.0000 mg | ORAL_TABLET | Freq: Three times a day (TID) | ORAL | 0 refills | Status: DC | PRN

## 2016-12-01 MED ORDER — MELOXICAM 7.5 MG PO TABS: 7.5000 mg | ORAL_TABLET | Freq: Every day | ORAL | 0 refills | Status: DC

## 2016-12-01 NOTE — Progress Notes (Signed)
   CC: left hip pain  HPI:  [name] is a 66 y.o. woman with history as noted below presenting for follow up of left hip pain.  Left hip pain for the last two weeks. No falls. Able to ambulate but much slower. Wakes her from her sleep. She previously had right SI joint injection. Moving makes pain worse. Nothing improves. Tried muscle relaxer, voltaren gel, tylenol without relief, and heat packs.  Past Medical History:  Diagnosis Date  . Anemia   . Arthritis   . Borderline personality disorder   . CAD (coronary artery disease)    Catherization 11/18/09>nonobstructive CAD , normal LV function, EF 65%  . Cervicalgia   . Depression   . Diabetes mellitus   . Diabetes mellitus, type II (HCC)   . Diabetic gastroparesis (HCC)   . Diastolic CHF (HCC)    Grade I, on Echo 06/2013, EF 55-60%  . GERD (gastroesophageal reflux disease)   . Gout   . Headache(784.0)   . Health maintenance examination    Colonoscopy 2009> normal; Diabetic eye exam 12/2008. No diabetic retinopathy; Mammogram 11/10> No evidence of malignancy, DXA 03/14/13 : normal  . Hyperlipidemia   . Hypertension   . Iliotibial band syndrome   . Low back pain syndrome   . Nonorganic psychosis   . Peripheral neuropathy (HCC)   . Right carpal tunnel syndrome 09/13/2014  . SBO (small bowel obstruction) 01/09/2013  . Sleep apnea    does not wear CPAP   . Vitamin B12 deficiency   . Vitamin D deficiency   . Wears dentures    upper  . Wears glasses     Review of Systems:   No fevers or chills No diarrhea  Physical Exam:  Vitals:   12/01/16 1502  BP: 125/60  Pulse: 77  Temp: 98 F (36.7 C)  TempSrc: Oral  SpO2: 99%  Weight: 181 lb 6.4 oz (82.3 kg)  Height: 5\' 5"  (1.651 m)   General Apperance: NAD HEENT: Normocephalic, atraumatic, anicteric sclera Neck: Supple, trachea midline Lungs: Clear to auscultation bilaterally. No wheezes, rhonchi or rales. Breathing comfortably Heart: Regular rate and rhythm, no  murmur/rub/gallop Abdomen: Soft, nontender, nondistended, no rebound/guarding Extremities: Warm and well perfused, tender to palpation left posterior hip. Internal and external rotation is limited by pain Skin: No rashes or lesions Neurologic: Alert and interactive. No gross deficits.   Assessment & Plan:   See Encounters Tab for problem based charting.  Patient discussed with Dr. Heide SparkNarendra

## 2016-12-01 NOTE — Patient Instructions (Addendum)
We will refer you to sports medicine

## 2016-12-01 NOTE — Telephone Encounter (Signed)
[facility] she states she cannot move w/o having severe pain, states lower back, pelvic are, hip. She states she cannot take much more. appt 1415

## 2016-12-02 NOTE — Assessment & Plan Note (Signed)
Assessment: Left posterior hip pain for the past two weeks. History of fluoroscopically guided R SI joint injection in October. No falls. Able to ambulate still. Has tried muscle relaxer, voltaren gel, tylenol and heat packs. Denies groin or lateral hip pain but does have pain on internal and external rotation of the left hip. Location is more consistent with possible SI joint dysfunction/sacroiliitis.  Plan: Meloxicam 7.5mg  daily for 7 days Continue Flexeril and heat packs Referral placed for sports medicine

## 2016-12-03 ENCOUNTER — Encounter: Payer: Self-pay | Admitting: Family Medicine

## 2016-12-03 ENCOUNTER — Ambulatory Visit (INDEPENDENT_AMBULATORY_CARE_PROVIDER_SITE_OTHER): Payer: Medicare Other | Admitting: Family Medicine

## 2016-12-03 VITALS — BP 120/60 | Ht 65.0 in | Wt 179.2 lb

## 2016-12-03 DIAGNOSIS — M549 Dorsalgia, unspecified: Secondary | ICD-10-CM | POA: Diagnosis not present

## 2016-12-03 NOTE — Patient Instructions (Signed)
Dr. Terrilee FilesZach Smith [address] [place]  Phone: [phone] Monday 12/06/16 at 915am

## 2016-12-03 NOTE — Progress Notes (Signed)
  [name] - 66 y.o. female MRN [id number]  Date of birth: 1951/08/09    SUBJECTIVE:      Chief Complaint:/ HPI:  Left " Hip" pain (area she points to is actually left back area. It extends from mid back to the top of the left iliac crest. Top the iliac crest is where it seems to be the worst for her by report  Started 2 weeks ago. No specific inciting event. Just woke up with some pain note she was having worsening pain with certain movements. It will be sharp and shooting in the muscles we'll grab. This causes excruciating pain and for a few seconds she's unable to do any specific activity. Was seen by her PCP and given some muscle relaxants, 5 mg cyclobenzaprine which she's been taking 3 times a day without any improvement. Pain is in the left back left superior iliac area as described above, does not radiate into the but on her leg. Not associated with any lower extremity weakness. No bowel or bladder incontinence. Walking and certain activities such as turning or twisting make it worse. She has difficulty getting it up and down from a chair. She has some night pain.  ROS:     Denies fever. Has had no unusual weight change. No change in bowel or bladder habits. No constipation. No, pain. No shortness of breath. No numbness or tingling in her lower extremities  PERTINENT  PMH / PSH FH / / SH:  Past Medical, Surgical, Social, and Family History Reviewed & Updated in the EMR.  Pertinent findings include:  History of right SI joint injection and history of issues with issue bursitis on the right. Diabetes Depression CKD 3 DJD multiple joints Former smoker    OBJECTIVE: BP 120/60   Ht 5\' 5"  (1.651 m)   Wt 179 lb 3.2 oz (81.3 kg)   BMI 29.82 kg/m   Physical Exam:  Vital signs are reviewed. GEN.: Well-developed female no acute distress BACK: Tender to palpation in the left thoracolumbar muscle area, iliocostalis. Point tender at the top of the left iliac crest. The right thoracolumbar  back is without tenderness to palpation. Limited flexion at the hips to about 20 of hyperextension to 5. Lateral rotation 20 both sides. Increased pain with hyperextension, lateral rotation, straightening up from forward flexion. HIPS: Right hip motion is significantly reduced in internal and external rotation but is painless. GAIT: Significantly decreased stride length on the right. Antalgic gait with compensatory weight transfer secondary to right hip dysfunction.  ASSESSMENT & PLAN:  Left-sided thoracolumbar muscle spasm and pain. I think this is mostly related to her right hip DJD which is fairly severe is decreased internal and external rotation, decreased stride length. Think she's done something to get these muscles and spasm. Since muscle relaxers have not worked, I would refer her for OMT with  appointment with Dr. Katrinka BlazingSmith.

## 2016-12-04 NOTE — Progress Notes (Signed)
[name] [address] [place], [place] [address] Phone: [phone] Subjective:    I'm seeing this patient by the request  of:  Dr. Jennette Kettle MD.   CC: low back strain  [id number]  [name] is a 66 y.o. female coming in with complaint ofpain in her left posterior hip and back. States it seems to go from the mid back all the way to the lateral aspect of her back. States that this started approximately 2-3 weeks ago. Does not remember injuring injury. The morning and noted pain immediately. States that certain movements can catch her breath. Rates the severity pain is 8 out of 10 and describes it as excruciating. Patient states that it is giving her trouble with daily activities. Patient initially did see her primary care provider and was given muscle relaxers which she had been taking 3 times a day with no significant improvement. Sometimes having pain even at night. Not responding to over-the-counter medications.   relevant past medical history also includes sacroiliac arthritis which patient has received injections last one 10/17CT pelvis taken 06/24/2016 was independently visualized by me showing moderate to severe degenerative disc disease of the lumbar spine  Past Medical History:  Diagnosis Date  . Anemia   . Arthritis   . Borderline personality disorder   . CAD (coronary artery disease)    Catherization 11/18/09>nonobstructive CAD , normal LV function, EF 65%  . Cervicalgia   . Depression   . Diabetes mellitus   . Diabetes mellitus, type II (HCC)   . Diabetic gastroparesis (HCC)   . Diastolic CHF (HCC)    Grade I, on Echo 06/2013, EF 55-60%  . GERD (gastroesophageal reflux disease)   . Gout   . Headache(784.0)   . Health maintenance examination    Colonoscopy 2009> normal; Diabetic eye exam 12/2008. No diabetic retinopathy; Mammogram 11/10> No evidence of malignancy, DXA 03/14/13 : normal  . Hyperlipidemia   . Hypertension   . Iliotibial band  syndrome   . Low back pain syndrome   . Nonorganic psychosis   . Peripheral neuropathy (HCC)   . Right carpal tunnel syndrome 09/13/2014  . SBO (small bowel obstruction) 01/09/2013  . Sleep apnea    does not wear CPAP   . Vitamin B12 deficiency   . Vitamin D deficiency   . Wears dentures    upper  . Wears glasses    Past Surgical History:  Procedure Laterality Date  . ABDOMINAL HYSTERECTOMY    . APPENDECTOMY    . BOWEL RESECTION N/A 01/10/2013   Procedure: SMALL BOWEL RESECTION;  Surgeon: Lodema Pilot, DO;  Location: [facility];  Service: General;  Laterality: N/A;  . CARPAL TUNNEL RELEASE Right 09/13/2014   Procedure: RIGHT WRIST CARPAL TUNNEL RELEASE;  Surgeon: Eulas Post, MD;  Location: [facility];  Service: Orthopedics;  Laterality: Right;  . CARPAL TUNNEL RELEASE Left 02/28/2015   Procedure: LEFT CARPAL TUNNEL RELEASE;  Surgeon: Teryl Lucy, MD;  Location:  SURGERY CENTER;  Service: Orthopedics;  Laterality: Left;  . HAND SURGERY  1992   rt  . HERNIA REPAIR  03/01/14   Lap incisional hernia repair w/mesh  . INCISIONAL HERNIA REPAIR N/A 03/01/2014   Procedure: LAPAROSCOPIC INCISIONAL HERNIA;  Surgeon: Axel Filler, MD;  Location: [facility];  Service: General;  Laterality: N/A;  . INSERTION OF MESH N/A 03/01/2014   Procedure: INSERTION OF MESH;  Surgeon: Axel Filler, MD;  Location: [facility];  Service: General;  Laterality: N/A;  . LAPAROTOMY N/A 01/10/2013   Procedure: EXPLORATORY LAPAROTOMY;  Surgeon: Lodema PilotBrian Layton, DO;  Location: [facility];  Service: General;  Laterality: N/A;  . LYSIS OF ADHESION N/A 01/10/2013   Procedure: LYSIS OF ADHESION;  Surgeon: Lodema PilotBrian Layton, DO;  Location: [facility];  Service: General;  Laterality: N/A;  . STERIOD INJECTION Left 09/13/2014   Procedure: STEROID INJECTION LEFT HAND;  Surgeon: Eulas PostJoshua P Landau, MD;  Location: [facility];  Service: Orthopedics;  Laterality: Left;   Social History   Social History  . Marital  status: Married    Spouse name: N/A  . Number of children: N/A  . Years of education: N/A   Social History Main Topics  . Smoking status: Former Smoker    Types: Cigarettes    Quit date: 10/25/2002  . Smokeless tobacco: Never Used  . Alcohol use No  . Drug use: No  . Sexual activity: Not Currently   Other Topics Concern  . Not on file   Social History Narrative  . No narrative on file   Allergies  Allergen Reactions  . Cimetidine Other (See Comments)    Breast swelling  . Indomethacin Diarrhea    Caused severe diarrhea with episodes of incontinance  . Penicillins Hives and Itching    Has patient had a PCN reaction causing immediate rash, facial/tongue/throat swelling, SOB or lightheadedness with hypotension: Yes Has patient had a PCN reaction causing severe rash involving mucus membranes or skin necrosis: No Has patient had a PCN reaction that required hospitalization No Has patient had a PCN reaction occurring within the last 10 years: No If all of the above answers are "NO", then may proceed with Cephalosporin use.  Other reaction(s): Delusions (intolerance)  . Sulfonamide Derivatives     unknown  . Sulfamethoxazole Other (See Comments)    Unknown reaction    Family History  Problem Relation Age of Onset  . Diabetes Mother   . Hypertension Mother   . Hyperlipidemia Mother   . Arthritis Mother   . Depression Mother   . Heart disease Father   . Diabetes Brother   . Heart disease Brother   . Heart disease Paternal Grandmother   . Diabetes Brother     Past medical history, social, surgical and family history all reviewed in electronic medical record.  No pertanent information unless stated regarding to the chief complaint.   Review of Systems:Review of systems updated and as accurate as of 12/06/16  No headache, visual changes, nausea, vomiting, diarrhea, constipation, dizziness, abdominal pain, skin rash, fevers, chills, night sweats, weight loss, swollen lymph  nodes, , chest pain, shortness of breath, mood changes.  Positive for muscle aches and joint pain  Objective  Blood pressure 138/90, pulse 64, height 5\' 5"  (1.651 m), weight 162 lb (73.5 kg). Systems examined below as of 12/06/16   General: No apparent distress alert and oriented x3 mood and affect normal, dressed appropriately.  HEENT: Pupils equal, extraocular movements intact  Respiratory: Patient's speak in full sentences and does not appear short of breath  Cardiovascular: No lower extremity edema, non tender, no erythema  Skin: Warm dry intact with no signs of infection or rash on extremities or on axial skeleton.  Abdomen: Soft nontender  Neuro: Cranial nerves II through XII are intact, neurovascularly intact in all extremities with 2+ DTRs and 2+ pulses.  Lymph: No lymphadenopathy of posterior or anterior cervical chain or axillae bilaterally.  Gait normal with good balance and coordination.  MSK:  Non tender with full range of motion and good stability and symmetric strength and tone of shoulders, elbows, wrist, hip, knee and ankles bilaterally. Arthritic changes of multiple joints Back Exam:  Inspection: Loss of lordosis Motion: Flexion 30 deg, Extension 15 deg, Side Bending to 35 deg bilaterally,  Rotation to 35 deg bilaterally  SLR laying: Negative  XSLR laying: Negative  Palpable tenderness: Tender to palpation of her spinal musculature of the lumbar spine right greater than left mostly over the sacroiliac joint.Marland Kitchen FABER: Positive left. Sensory change: Gross sensation intact to all lumbar and sacral dermatomes.  Reflexes: 2+ at both patellar tendons, 2+ at achilles tendons, Babinski's downgoing.  Strength at foot  4/5 strength but symmetric  Procedure note 97110; 15 minutes spent for Therapeutic exercises as stated in above notes.  This included exercises focusing on stretching, strengthening, with significant focus on eccentric aspects.  Low back exercises that included:    Pelvic tilt/bracing instruction to focus on control of the pelvic girdle and lower abdominal muscles  Glute strengthening exercises, focusing on proper firing of the glutes without engaging the low back muscles Proper stretching techniques for maximum relief for the hamstrings, hip flexors, low back and some rotation where tolerated   Proper technique shown and discussed handout in great detail with ATC.  All questions were discussed and answered.     Impression and Recommendations:     This case required medical decision making of moderate complexity.      Note: This dictation was prepared with Dragon dictation along with smaller phrase technology. Any transcriptional errors that result from this process are unintentional.

## 2016-12-06 ENCOUNTER — Ambulatory Visit (INDEPENDENT_AMBULATORY_CARE_PROVIDER_SITE_OTHER): Payer: Medicare Other | Admitting: Family Medicine

## 2016-12-06 ENCOUNTER — Encounter: Payer: Self-pay | Admitting: Family Medicine

## 2016-12-06 DIAGNOSIS — M4698 Unspecified inflammatory spondylopathy, sacral and sacrococcygeal region: Secondary | ICD-10-CM

## 2016-12-06 DIAGNOSIS — M47818 Spondylosis without myelopathy or radiculopathy, sacral and sacrococcygeal region: Secondary | ICD-10-CM

## 2016-12-06 DIAGNOSIS — M461 Sacroiliitis, not elsewhere classified: Secondary | ICD-10-CM | POA: Insufficient documentation

## 2016-12-06 MED ORDER — GABAPENTIN 100 MG PO CAPS
200.0000 mg | ORAL_CAPSULE | Freq: Every day | ORAL | 3 refills | Status: DC
Start: 2016-12-06 — End: 2017-01-03

## 2016-12-06 MED ORDER — VITAMIN D (ERGOCALCIFEROL) 1.25 MG (50000 UNIT) PO CAPS: 50000.0000 [IU] | ORAL_CAPSULE | ORAL | 0 refills | Status: DC

## 2016-12-06 NOTE — Patient Instructions (Signed)
Good to see you  Gustavus Bryantce is your friend. Ice 20 minutes 2 times daily. Usually after activity and before bed. Exercises 3 times a week.  Gabapentin 2200mg  at night Once weekly vitamin D for next 12 weeks.  Over the counter look for Turmeric 500mg  daily  Tart cherry extract any dose at night Keep wearing good shoes.  See me again in 4 weeks to make sure you are doing better.

## 2016-12-06 NOTE — Assessment & Plan Note (Signed)
Patient does have more of a sacroiliac arthritis. There is a possibility lumbar radiculopathy. We discussed with patient at great length. We will try icing regimen, home exercises and patient work with Event organiserathletic trainer. Topical anti-inflammatories given. Gabapentin for any radicular symptoms. Patient will follow-up with me again in 4 weeks. Worsening symptoms we'll consider advanced imaging as well as potential physical therapy or possible injection for diagnostic and therapeutic purposes.

## 2016-12-08 NOTE — Progress Notes (Signed)
Internal Medicine Clinic Attending  Case discussed with Dr. Krall at the time of the visit.  We reviewed the resident's history and exam and pertinent patient test results.  I agree with the assessment, diagnosis, and plan of care documented in the resident's note.  

## 2016-12-09 ENCOUNTER — Encounter: Payer: Self-pay | Admitting: *Deleted

## 2016-12-13 ENCOUNTER — Ambulatory Visit (INDEPENDENT_AMBULATORY_CARE_PROVIDER_SITE_OTHER): Payer: 59 | Admitting: Clinical

## 2016-12-13 DIAGNOSIS — F411 Generalized anxiety disorder: Secondary | ICD-10-CM

## 2016-12-13 DIAGNOSIS — F331 Major depressive disorder, recurrent, moderate: Secondary | ICD-10-CM

## 2016-12-14 NOTE — Progress Notes (Signed)
   THERAPIST PROGRESS NOTE  Session Time: 9:02 -9:58   Participation Level: Active  Behavioral Response: CasualAlertDepressed  Type of Therapy: Individual Therapy  Treatment Goals addressed: Improve Psychiatric Symptoms,   Interventions: Motivational Interviewing,   Summary: [name] is [name] 66 y.o. female who presents with Major Depressive Disorder, Generalized Anxiety Disorder and Grief   Suicidal/Homicidal: No -without intent/plan  Therapist Response:  [name] met with clinician for an individual session. [name] shared about her psychiatric symptoms, her current life events and update her assessment. [name] shared that she had been feeling better and was using her skills learned in therapy. She shared that recently she found her self avoiding and isolating. She shared that she feels herself becoming more depressed and anxious. Client and clinician discussed her symptoms and reviewed some techniques.    Plan: Return again in 1-3 weeks.  Diagnosis:     Axis I: Major Depressive Disorder, Generalized Anxiety Disorder       [name] Din A, LCSW 12/14/2016
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
## 2016-12-15 ENCOUNTER — Encounter (HOSPITAL_COMMUNITY): Payer: Self-pay | Admitting: Clinical

## 2016-12-22 ENCOUNTER — Encounter: Payer: Self-pay | Admitting: Internal Medicine

## 2017-01-02 NOTE — Progress Notes (Signed)
[name] [address] [place], [place] [address] Phone: [phone] Subjective:    I'm seeing this patient by the request  of:  Dr. Jennette Kettle MD.   CC: low back strain f/u  [id number]  [name] is a 66 y.o. female coming in with complaint ofpain in her left posterior hip and back.Patient was seen previously and was having signs and symptoms that was more corresponding to a lumbar radicular symptoms. Started on low dose gabapentin as well as home exercises. Patient states v she is doing better. Patient states that she is 80% better. Not having as severe pain. As the pain as a dull, throbbing aching sensation. States that this is much better than what it was previously. Patient still has some pain in the right buttocks but nothing as severe as what it was.   relevant past medical history also includes sacroiliac arthritis which patient has received injections last one 10/17CT pelvis taken 06/24/2016 was independently visualized by me showing moderate to severe degenerative disc disease of the lumbar spine.   Past Medical History:  Diagnosis Date  . Anemia   . Arthritis   . Borderline personality disorder   . CAD (coronary artery disease)    Catherization 11/18/09>nonobstructive CAD , normal LV function, EF 65%  . Cervicalgia   . Depression   . Diabetes mellitus   . Diabetes mellitus, type II (HCC)   . Diabetic gastroparesis (HCC)   . Diastolic CHF (HCC)    Grade I, on Echo 06/2013, EF 55-60%  . GERD (gastroesophageal reflux disease)   . Gout   . Headache(784.0)   . Health maintenance examination    Colonoscopy 2009> normal; Diabetic eye exam 12/2008. No diabetic retinopathy; Mammogram 11/10> No evidence of malignancy, DXA 03/14/13 : normal  . Hyperlipidemia   . Hypertension   . Iliotibial band syndrome   . Low back pain syndrome   . Nonorganic psychosis   . Peripheral neuropathy (HCC)   . Right carpal tunnel syndrome 09/13/2014  . SBO (small  bowel obstruction) 01/09/2013  . Sleep apnea    does not wear CPAP   . Vitamin B12 deficiency   . Vitamin D deficiency   . Wears dentures    upper  . Wears glasses    Past Surgical History:  Procedure Laterality Date  . ABDOMINAL HYSTERECTOMY    . APPENDECTOMY    . BOWEL RESECTION N/A 01/10/2013   Procedure: SMALL BOWEL RESECTION;  Surgeon: Lodema Pilot, DO;  Location: [facility];  Service: General;  Laterality: N/A;  . CARPAL TUNNEL RELEASE Right 09/13/2014   Procedure: RIGHT WRIST CARPAL TUNNEL RELEASE;  Surgeon: Eulas Post, MD;  Location: [facility];  Service: Orthopedics;  Laterality: Right;  . CARPAL TUNNEL RELEASE Left 02/28/2015   Procedure: LEFT CARPAL TUNNEL RELEASE;  Surgeon: Teryl Lucy, MD;  Location: [facility];  Service: Orthopedics;  Laterality: Left;  . HAND SURGERY  1992   rt  . HERNIA REPAIR  03/01/14   Lap incisional hernia repair w/mesh  . INCISIONAL HERNIA REPAIR N/A 03/01/2014   Procedure: LAPAROSCOPIC INCISIONAL HERNIA;  Surgeon: Axel Filler, MD;  Location: [facility];  Service: General;  Laterality: N/A;  . INSERTION OF MESH N/A 03/01/2014   Procedure: INSERTION OF MESH;  Surgeon: Axel Filler, MD;  Location: [facility];  Service: General;  Laterality: N/A;  . LAPAROTOMY N/A 01/10/2013   Procedure: EXPLORATORY LAPAROTOMY;  Surgeon: Lodema Pilot, DO;  Location: [facility];  Service: General;  Laterality: N/A;  . LYSIS OF ADHESION N/A 01/10/2013   Procedure: LYSIS OF ADHESION;  Surgeon: Lodema Pilot, DO;  Location: [facility];  Service: General;  Laterality: N/A;  . STERIOD INJECTION Left 09/13/2014   Procedure: STEROID INJECTION LEFT HAND;  Surgeon: Eulas Post, MD;  Location: [facility];  Service: Orthopedics;  Laterality: Left;   Social History   Social History  . Marital status: Married    Spouse name: N/A  . Number of children: N/A  . Years of education: N/A   Social History Main Topics  . Smoking status: Former Smoker      Types: Cigarettes    Quit date: 10/25/2002  . Smokeless tobacco: Never Used  . Alcohol use No  . Drug use: No  . Sexual activity: Not Currently   Other Topics Concern  . None   Social History Narrative  . None   Allergies  Allergen Reactions  . Cimetidine Other (See Comments)    Breast swelling  . Indomethacin Diarrhea    Caused severe diarrhea with episodes of incontinance  . Penicillins Hives and Itching    Has patient had a PCN reaction causing immediate rash, facial/tongue/throat swelling, SOB or lightheadedness with hypotension: Yes Has patient had a PCN reaction causing severe rash involving mucus membranes or skin necrosis: No Has patient had a PCN reaction that required hospitalization No Has patient had a PCN reaction occurring within the last 10 years: No If all of the above answers are "NO", then may proceed with Cephalosporin use.  Other reaction(s): Delusions (intolerance)  . Sulfonamide Derivatives     unknown  . Sulfamethoxazole Other (See Comments)    Unknown reaction    Family History  Problem Relation Age of Onset  . Diabetes Mother   . Hypertension Mother   . Hyperlipidemia Mother   . Arthritis Mother   . Depression Mother   . Heart disease Father   . Diabetes Brother   . Heart disease Brother   . Diabetes Brother   . Heart disease Paternal Grandmother     Past medical history, social, surgical and family history all reviewed in electronic medical record.  No pertanent information unless stated regarding to the chief complaint.   Review of Systems: No headache, visual changes, nausea, vomiting, diarrhea, constipation, dizziness, abdominal pain, skin rash, fevers, chills, night sweats, weight loss, swollen lymph nodes, body aches, joint swelling,  chest pain, shortness of breath, mood changes.  + muscle pain.   Objective  Blood pressure 122/74, pulse 74, height 5\' 5"  (1.651 m), weight 184 lb 12 oz (83.8 kg), SpO2 98 %.  Systems examined below  as of 01/03/17 General: NAD A&O x3 mood, affect normal  HEENT: Pupils equal, extraocular movements intact no nystagmus Respiratory: not short of breath at rest or with speaking Cardiovascular: No lower extremity edema, non tender Skin: Warm dry intact with no signs of infection or rash on extremities or on axial skeleton. Abdomen: Soft nontender, no masses Neuro: Cranial nerves  intact, neurovascularly intact in all extremities with 2+ DTRs and 2+ pulses. Lymph: No lymphadenopathy appreciated today  Gait normal with good balance and coordination.   MSK:  Non tender with full range of motion and good stability and symmetric strength and tone of shoulders, elbows, wrist, hip, knee and ankles bilaterally. Arthritic changes of multiple joints Back Exam:  Inspection: Loss of lordosis  Motion: Flexion 30 deg, Extension 15 deg, Side Bending to 35 deg bilaterally,  Rotation to 35 deg bilaterally  SLR laying: Negative  XSLR laying: Negative  Palpable tenderness: Continued tenderness in the per spinal musculature. More tenderness actually of the right ischial area. FABER: Still has some stiffness but improved from previous exam Sensory change: Gross sensation intact to all lumbar and sacral dermatomes.  Reflexes: 2+ at both patellar tendons, 2+ at achilles tendons, Babinski's downgoing.  Strength at foot  4+/5 strength but symmetric      Impression and Recommendations:     This case required medical decision making of moderate complexity.      Note: This dictation was prepared with Dragon dictation along with smaller phrase technology. Any transcriptional errors that result from this process are unintentional.

## 2017-01-03 ENCOUNTER — Encounter: Payer: Self-pay | Admitting: Family Medicine

## 2017-01-03 ENCOUNTER — Ambulatory Visit (INDEPENDENT_AMBULATORY_CARE_PROVIDER_SITE_OTHER): Payer: Medicare Other | Admitting: Family Medicine

## 2017-01-03 DIAGNOSIS — M4698 Unspecified inflammatory spondylopathy, sacral and sacrococcygeal region: Secondary | ICD-10-CM | POA: Diagnosis not present

## 2017-01-03 DIAGNOSIS — M4716 Other spondylosis with myelopathy, lumbar region: Secondary | ICD-10-CM

## 2017-01-03 DIAGNOSIS — M47818 Spondylosis without myelopathy or radiculopathy, sacral and sacrococcygeal region: Secondary | ICD-10-CM

## 2017-01-03 DIAGNOSIS — M797 Fibromyalgia: Secondary | ICD-10-CM | POA: Diagnosis not present

## 2017-01-03 MED ORDER — GABAPENTIN 300 MG PO CAPS: 300.0000 mg | ORAL_CAPSULE | Freq: Every day | ORAL | 1 refills | Status: DC

## 2017-01-03 NOTE — Assessment & Plan Note (Signed)
Patient overall seems to be doing relatively well. We discussed with patient again about different interventions a could be done. Patient seems to be doing well with the medication and we'll increase patient's gabapentin 300 mg at night. We discussed formal physical therapy which patient declined. Continue home exercises. Discussed which activities doing which ones to avoid. Continue the once weekly vitamin D. His lungs patient continues to make improvement and continued with conservative therapy.

## 2017-01-03 NOTE — Patient Instructions (Addendum)
Good to see you  [name] is your friend.  I think eyou are doing great  Increase gabapentin to 300mg  at night Keep up with the vitamin D Stay active and do your exercises  See me again in 4-6 weeks.

## 2017-01-03 NOTE — Assessment & Plan Note (Signed)
Stable. Worsening symptoms we'll consider injection. 

## 2017-01-03 NOTE — Assessment & Plan Note (Signed)
Discussed with patient muscle aches Monday different over-the-counter medications a could be beneficial. Patient is on other medications for this but not making significant improvement. Patient exited is associated with her diabetes. We will continue to follow. Follow-up again in 4-6 weeks.

## 2017-01-04 ENCOUNTER — Ambulatory Visit (HOSPITAL_COMMUNITY): Payer: Self-pay | Admitting: Clinical

## 2017-01-12 ENCOUNTER — Ambulatory Visit (INDEPENDENT_AMBULATORY_CARE_PROVIDER_SITE_OTHER): Payer: Medicare Other | Admitting: Internal Medicine

## 2017-01-12 ENCOUNTER — Encounter: Payer: Self-pay | Admitting: Internal Medicine

## 2017-01-12 VITALS — BP 165/73 | HR 68 | Temp 98.4°F | Ht 65.0 in | Wt 187.5 lb

## 2017-01-12 DIAGNOSIS — N898 Other specified noninflammatory disorders of vagina: Secondary | ICD-10-CM

## 2017-01-12 DIAGNOSIS — Z8742 Personal history of other diseases of the female genital tract: Secondary | ICD-10-CM

## 2017-01-12 DIAGNOSIS — Z87891 Personal history of nicotine dependence: Secondary | ICD-10-CM | POA: Diagnosis not present

## 2017-01-12 DIAGNOSIS — E785 Hyperlipidemia, unspecified: Secondary | ICD-10-CM | POA: Diagnosis not present

## 2017-01-12 DIAGNOSIS — Z79899 Other long term (current) drug therapy: Secondary | ICD-10-CM

## 2017-01-12 DIAGNOSIS — E119 Type 2 diabetes mellitus without complications: Secondary | ICD-10-CM

## 2017-01-12 DIAGNOSIS — Z794 Long term (current) use of insulin: Secondary | ICD-10-CM

## 2017-01-12 DIAGNOSIS — I10 Essential (primary) hypertension: Secondary | ICD-10-CM

## 2017-01-12 LAB — GLUCOSE, CAPILLARY: Glucose-Capillary: 117 mg/dL — ABNORMAL HIGH (ref 65–99)

## 2017-01-12 LAB — POCT GLYCOSYLATED HEMOGLOBIN (HGB A1C): Hemoglobin A1C: 6.2

## 2017-01-12 MED ORDER — ESTROGENS CONJUGATED 0.3 MG PO TABS: 0.3000 mg | ORAL_TABLET | Freq: Every day | ORAL | 3 refills | Status: DC

## 2017-01-12 MED ORDER — HYDROCHLOROTHIAZIDE 12.5 MG PO CAPS: 12.5000 mg | ORAL_CAPSULE | Freq: Every day | ORAL | 11 refills | Status: DC

## 2017-01-12 MED ORDER — ROSUVASTATIN CALCIUM 10 MG PO TABS: 10.0000 mg | ORAL_TABLET | Freq: Every day | ORAL | 11 refills | Status: AC

## 2017-01-12 NOTE — Progress Notes (Signed)
   CC: diabetes  HPI:  [name] is a 66 y.o. with PMHx as outlined below who presents to clinic for diabetes follow up. Please see problem list for further details of patient's chronic medical issues.   Past Medical History:  Diagnosis Date  . Anemia   . Arthritis   . Borderline personality disorder   . CAD (coronary artery disease)    Catherization 11/18/09>nonobstructive CAD , normal LV function, EF 65%  . Cervicalgia   . Depression   . Diabetes mellitus   . Diabetes mellitus, type II (HCC)   . Diabetic gastroparesis (HCC)   . Diastolic CHF (HCC)    Grade I, on Echo 06/2013, EF 55-60%  . GERD (gastroesophageal reflux disease)   . Gout   . Headache(784.0)   . Health maintenance examination    Colonoscopy 2009> normal; Diabetic eye exam 12/2008. No diabetic retinopathy; Mammogram 11/10> No evidence of malignancy, DXA 03/14/13 : normal  . Hyperlipidemia   . Hypertension   . Iliotibial band syndrome   . Low back pain syndrome   . Nonorganic psychosis   . Peripheral neuropathy (HCC)   . Right carpal tunnel syndrome 09/13/2014  . SBO (small bowel obstruction) 01/09/2013  . Sleep apnea    does not wear CPAP   . Vitamin B12 deficiency   . Vitamin D deficiency   . Wears dentures    upper  . Wears glasses     Review of Systems:  Denies abd pain, nausea, and vomiting. Having pelvic pain that has been chronic with a burning sensation that occurs after she urinates.   Physical Exam:  Vitals:   01/12/17 1329 01/12/17 1446  BP: (!) 169/67 (!) 165/73  Pulse: 75 68  Temp: 98.4 F (36.9 C)   TempSrc: Oral   SpO2: 100%   Weight: 187 lb 8 oz (85 kg)   Height: 5\' 5"  (1.651 m)    Physical Exam  Constitutional: oriented to person, place, and time. appears well-developed and well-nourished. No distress.  HENT:  Head: Normocephalic and atraumatic.  Nose: Nose normal.  Cardiovascular: Normal rate, regular rhythm and normal heart sounds.  Exam reveals no gallop and no friction  rub.   No murmur heard. Pulmonary/Chest: Effort normal and breath sounds normal. No respiratory distress.  has no wheezes.no rales.  Abdominal: Soft. Bowel sounds are normal.  exhibits no distension. There is no tenderness. There is no rebound and no guarding.  Neurological: alert and oriented to person, place, and time.  Skin: Skin is warm and dry. No rash noted.  not diaphoretic. No erythema. No pallor.    Assessment & Plan:   See Encounters Tab for problem based charting.  Patient discussed with Dr. Rogelia BogaButcher

## 2017-01-12 NOTE — Patient Instructions (Addendum)
Start taking estrogen 0.3mg  once daily for vaginal dryness.   You can STOP taking metformin 500mg .   Start taking hydrochlorothiazide 12.5mg  daily for your blood pressure.

## 2017-01-13 NOTE — Assessment & Plan Note (Addendum)
A: Pt reports burning after urination, pelvic pain, and pelvic pressure. She is not sexually active. She states her external labia are irritated. Denies vaginal d/c, foul order, and itching. She had a pelvic exam done in 11/2016 that was positive for BV and yeast and she completed a 7 day course of flagyl and diflucan. She is on diflucan 150mg  q7days for recurrent yeast infections.   P: rx for premarin 0.3mg  daily and referral placed for gynecology.

## 2017-01-13 NOTE — Assessment & Plan Note (Addendum)
ordered lipid panel today per patients request however she left without getting labs. She is on crestor 10mg  and tolerating it well. Can consider increasing to higher intensity dose pending lipid panel results and hx of DM.

## 2017-01-13 NOTE — Assessment & Plan Note (Signed)
Assessment: Blood pressure elevated with goal of less than 130/80. She is on a clonidine patch 0.3 mg every 24 hours, metoprolol 50mg  daily, and losartan 100 mg  Plan: Start hydrochlorothiazide 12.5 mg daily. Patient left Without getting her labs. Follow-up in one month for blood pressure check. Can obtain a basic metabolic panel that time.

## 2017-01-13 NOTE — Assessment & Plan Note (Addendum)
A: A1c at goal today. Requesting to come off of metformin 500mg  daily due to SE of weight gain. She is on lantus 45 units qhs and januvia 50mg . She did not bring in her glucometer with her.   P: Pt has fluctuating diabetes control. It has been well controlled likely with correlation of her depression management. Unlikely that metformin is causing her weight gain but will d/c low dose metformin.

## 2017-01-14 NOTE — Progress Notes (Signed)
Internal Medicine Clinic Attending  Case discussed with Dr. Truong at the time of the visit.  We reviewed the resident's history and exam and pertinent patient test results.  I agree with the assessment, diagnosis, and plan of care documented in the resident's note.  

## 2017-01-18 ENCOUNTER — Ambulatory Visit (INDEPENDENT_AMBULATORY_CARE_PROVIDER_SITE_OTHER): Payer: 59 | Admitting: Clinical

## 2017-01-18 ENCOUNTER — Encounter (HOSPITAL_COMMUNITY): Payer: Self-pay | Admitting: Clinical

## 2017-01-18 DIAGNOSIS — F331 Major depressive disorder, recurrent, moderate: Secondary | ICD-10-CM

## 2017-01-18 DIAGNOSIS — F411 Generalized anxiety disorder: Secondary | ICD-10-CM | POA: Diagnosis not present

## 2017-01-18 NOTE — Progress Notes (Signed)
   THERAPIST PROGRESS NOTE  Session Time: 10:03 -10:55  Participation Level: Active  Behavioral Response: NeatAlertNA  Type of Therapy: Individual Therapy  Treatment Goals addressed: Improve Psychiatric Symptoms, Calming Skills, Emotional Regulation Skills, Interpersonal Relationship Skills  Interventions: Motivational Interviewing,   Summary: [name] is a 66 y.o. female who presents with Major Depressive Disorder, Generalized Anxiety Disorder   Suicidal/Homicidal: No -without intent/plan  Therapist Response:  Arnell met with clinician for an individual session. [name] shared about her psychiatric symptoms, her current life events and her homework. [name] shared that she has had less bad days and more good days with her symptoms. Clinician asked open ended questions and [name] identified the  Skills (calming, emotional regulation and interpersonal) she has been using to keep her mood up. She shared about the areas she still struggles and client and clinician discussed ways she could continue to improve. [name] shared that her mother is depressed. She shared the signs she recognized from her own experience. Client and clinician discussed her thoughts and insights. Client and clinician agreed that she could graduate from therapy soon. Client and clinician discussed her improvements. Client and clinician agreed to meet a few more times to ensure she can maintain her skills  Plan: Return again in 3-4 weeks.  Diagnosis:     Axis I: Major Depressive Disorder, Generalized Anxiety Disorder   [name] A, LCSW 01/18/2017

## 2017-02-01 ENCOUNTER — Encounter (HOSPITAL_COMMUNITY): Payer: Self-pay | Admitting: Clinical

## 2017-02-01 ENCOUNTER — Ambulatory Visit (INDEPENDENT_AMBULATORY_CARE_PROVIDER_SITE_OTHER): Payer: Medicare Other | Admitting: Clinical

## 2017-02-01 DIAGNOSIS — F411 Generalized anxiety disorder: Secondary | ICD-10-CM | POA: Diagnosis not present

## 2017-02-01 DIAGNOSIS — F331 Major depressive disorder, recurrent, moderate: Secondary | ICD-10-CM | POA: Diagnosis not present

## 2017-02-01 NOTE — Progress Notes (Signed)
   THERAPIST PROGRESS NOTE  Session Time: 9:02 -10:00  Participation Level: Active  Behavioral Response: Neat and Well GroomedAlertDepressed  Type of Therapy: Individual Therapy  Treatment Goals addressed: Improve Psychiatric Symptoms, Calming Skills, Emotional Regulation Skills,  Interventions: Motivational Interviewing, CBT, Grounding  Techniques  Summary: [name] is a 66 y.o. female who presents with Major Depressive Disorder, Generalized Anxiety Disorder   Suicidal/Homicidal: No -without intent/plan  Therapist Response:  Stefani met with clinician for an individual session. [name] shared about her psychiatric symptoms and her current life events. [name] shared that her depression was high this week due experiencing a lot of physical pain ( back, stomach, head). Clinician asked open ended questions and [name] shared what she has been doing to cope with her depression and pain. Client and clinician discussed the importance of not returning to old depressive patterns such as isolating. Client and clinician reviewed and discussed skills to for calming and emotional regulation. Clinician taught [name] a new grounding technique which client and clinician practiced together.   Plan: Return again in 1-2 weeks.  Diagnosis:     Axis I: Major Depressive Disorder, Generalized Anxiety Disorder    Jolisa Intriago A, LCSW 02/01/2017

## 2017-02-02 NOTE — Progress Notes (Deleted)
[name] [address] [place], [place] [address] Phone: [phone] Subjective:    I'm seeing this patient by the request  of:  Dr. Jennette Kettle MD.   CC: low back strain f/u  [id number]  [name] is a 66 y.o. female coming in with complaint ofpain in her left posterior hip and back.Patient was seen previously and was having signs and symptoms that was more corresponding to a lumbar radicular symptoms. Patient was 80% better at last follow-up. We did increase gabapentin 300 mg. Encourage her to continue home exercises in the icing protocol as well as the over-the-counter medications. Patient states   relevant past medical history also includes sacroiliac arthritis which patient has received injections last one 10/17CT pelvis taken 06/24/2016 was independently visualized by me showing moderate to severe degenerative disc disease of the lumbar spine.   Past Medical History:  Diagnosis Date  . Anemia   . Arthritis   . Borderline personality disorder   . CAD (coronary artery disease)    Catherization 11/18/09>nonobstructive CAD , normal LV function, EF 65%  . Cervicalgia   . Depression   . Diabetes mellitus   . Diabetes mellitus, type II (HCC)   . Diabetic gastroparesis (HCC)   . Diastolic CHF (HCC)    Grade I, on Echo 06/2013, EF 55-60%  . GERD (gastroesophageal reflux disease)   . Gout   . Headache(784.0)   . Health maintenance examination    Colonoscopy 2009> normal; Diabetic eye exam 12/2008. No diabetic retinopathy; Mammogram 11/10> No evidence of malignancy, DXA 03/14/13 : normal  . Hyperlipidemia   . Hypertension   . Iliotibial band syndrome   . Low back pain syndrome   . Nonorganic psychosis   . Peripheral neuropathy (HCC)   . Right carpal tunnel syndrome 09/13/2014  . SBO (small bowel obstruction) 01/09/2013  . Sleep apnea    does not wear CPAP   . Vitamin B12 deficiency   . Vitamin D deficiency   . Wears dentures    upper  . Wears  glasses    Past Surgical History:  Procedure Laterality Date  . ABDOMINAL HYSTERECTOMY    . APPENDECTOMY    . BOWEL RESECTION N/A 01/10/2013   Procedure: SMALL BOWEL RESECTION;  Surgeon: Lodema Pilot, DO;  Location: [facility];  Service: General;  Laterality: N/A;  . CARPAL TUNNEL RELEASE Right 09/13/2014   Procedure: RIGHT WRIST CARPAL TUNNEL RELEASE;  Surgeon: Eulas Post, MD;  Location: [facility];  Service: Orthopedics;  Laterality: Right;  . CARPAL TUNNEL RELEASE Left 02/28/2015   Procedure: LEFT CARPAL TUNNEL RELEASE;  Surgeon: Teryl Lucy, MD;  Location: [facility];  Service: Orthopedics;  Laterality: Left;  . HAND SURGERY  1992   rt  . HERNIA REPAIR  03/01/14   Lap incisional hernia repair w/mesh  . INCISIONAL HERNIA REPAIR N/A 03/01/2014   Procedure: LAPAROSCOPIC INCISIONAL HERNIA;  Surgeon: Axel Filler, MD;  Location: [facility];  Service: General;  Laterality: N/A;  . INSERTION OF MESH N/A 03/01/2014   Procedure: INSERTION OF MESH;  Surgeon: Axel Filler, MD;  Location: [facility];  Service: General;  Laterality: N/A;  . LAPAROTOMY N/A 01/10/2013   Procedure: EXPLORATORY LAPAROTOMY;  Surgeon: Lodema Pilot, DO;  Location: [facility];  Service: General;  Laterality: N/A;  . LYSIS OF ADHESION N/A 01/10/2013   Procedure: LYSIS OF ADHESION;  Surgeon: Lodema Pilot, DO;  Location: [facility];  Service: General;  Laterality: N/A;  .  STERIOD INJECTION Left 09/13/2014   Procedure: STEROID INJECTION LEFT HAND;  Surgeon: Eulas Post, MD;  Location: [facility];  Service: Orthopedics;  Laterality: Left;   Social History   Social History  . Marital status: Married    Spouse name: N/A  . Number of children: N/A  . Years of education: N/A   Social History Main Topics  . Smoking status: Former Smoker    Types: Cigarettes    Quit date: 10/25/2002  . Smokeless tobacco: Never Used  . Alcohol use No  . Drug use: No  . Sexual activity: Not Currently   Other  Topics Concern  . Not on file   Social History Narrative  . No narrative on file   Allergies  Allergen Reactions  . Cimetidine Other (See Comments)    Breast swelling  . Indomethacin Diarrhea    Caused severe diarrhea with episodes of incontinance  . Penicillins Hives and Itching    Has patient had a PCN reaction causing immediate rash, facial/tongue/throat swelling, SOB or lightheadedness with hypotension: Yes Has patient had a PCN reaction causing severe rash involving mucus membranes or skin necrosis: No Has patient had a PCN reaction that required hospitalization No Has patient had a PCN reaction occurring within the last 10 years: No If all of the above answers are "NO", then may proceed with Cephalosporin use.  Other reaction(s): Delusions (intolerance)  . Sulfonamide Derivatives     unknown  . Sulfamethoxazole Other (See Comments)    Unknown reaction    Family History  Problem Relation Age of Onset  . Diabetes Mother   . Hypertension Mother   . Hyperlipidemia Mother   . Arthritis Mother   . Depression Mother   . Heart disease Father   . Diabetes Brother   . Heart disease Brother   . Diabetes Brother   . Heart disease Paternal Grandmother     Past medical history, social, surgical and family history all reviewed in electronic medical record.  No pertanent information unless stated regarding to the chief complaint.   Review of Systems: No headache, visual changes, nausea, vomiting, diarrhea, constipation, dizziness, abdominal pain, skin rash, fevers, chills, night sweats, weight loss, swollen lymph nodes, body aches, joint swelling,  chest pain, shortness of breath, mood changes.  + muscle pain.   Objective  There were no vitals taken for this visit.  Systems examined below as of 02/02/17 General: NAD A&O x3 mood, affect normal  HEENT: Pupils equal, extraocular movements intact no nystagmus Respiratory: not short of breath at rest or with  speaking Cardiovascular: No lower extremity edema, non tender Skin: Warm dry intact with no signs of infection or rash on extremities or on axial skeleton. Abdomen: Soft nontender, no masses Neuro: Cranial nerves  intact, neurovascularly intact in all extremities with 2+ DTRs and 2+ pulses. Lymph: No lymphadenopathy appreciated today  Gait normal with good balance and coordination.   MSK:  Non tender with full range of motion and good stability and symmetric strength and tone of shoulders, elbows, wrist, hip, knee and ankles bilaterally. Arthritic changes of multiple joints Back Exam:  Inspection: Loss of lordosis  Motion: Flexion 30 deg, Extension 15 deg, Side Bending to 35 deg bilaterally,  Rotation to 35 deg bilaterally  SLR laying: Negative  XSLR laying: Negative  Palpable tenderness: Continued tenderness in the per spinal musculature. More tenderness actually of the right ischial area. FABER: Still has some stiffness but improved from previous exam Sensory change:  Gross sensation intact to all lumbar and sacral dermatomes.  Reflexes: 2+ at both patellar tendons, 2+ at achilles tendons, Babinski's downgoing.  Strength at foot  4+/5 strength but symmetric      Impression and Recommendations:     This case required medical decision making of moderate complexity.      Note: This dictation was prepared with Dragon dictation along with smaller phrase technology. Any transcriptional errors that result from this process are unintentional.

## 2017-02-03 ENCOUNTER — Ambulatory Visit: Payer: Medicare Other | Admitting: Family Medicine

## 2017-02-10 ENCOUNTER — Encounter: Payer: Self-pay | Admitting: Family Medicine

## 2017-02-10 ENCOUNTER — Ambulatory Visit (INDEPENDENT_AMBULATORY_CARE_PROVIDER_SITE_OTHER): Payer: Medicare Other | Admitting: Family Medicine

## 2017-02-10 VITALS — BP 140/70 | HR 81 | Resp 16 | Wt 183.0 lb

## 2017-02-10 DIAGNOSIS — M4716 Other spondylosis with myelopathy, lumbar region: Secondary | ICD-10-CM | POA: Diagnosis not present

## 2017-02-10 DIAGNOSIS — M5416 Radiculopathy, lumbar region: Secondary | ICD-10-CM | POA: Diagnosis not present

## 2017-02-10 MED ORDER — KETOROLAC TROMETHAMINE 60 MG/2ML IM SOLN
60.0000 mg | Freq: Once | INTRAMUSCULAR | Status: AC
  Administered 2017-02-10: 60 mg via INTRAMUSCULAR

## 2017-02-10 MED ORDER — METHYLPREDNISOLONE ACETATE 80 MG/ML IJ SUSP
80.0000 mg | Freq: Once | INTRAMUSCULAR | Status: AC
Start: 2017-02-10 — End: 2017-02-10
  Administered 2017-02-10: 80 mg via INTRAMUSCULAR

## 2017-02-10 MED ORDER — GABAPENTIN 100 MG PO CAPS: 100.0000 mg | ORAL_CAPSULE | Freq: Two times a day (BID) | ORAL | 2 refills | Status: DC

## 2017-02-10 NOTE — Patient Instructions (Addendum)
Good to see you.   PT will be calling you   2 injections today  Ice 20 minutes 2 times daily. Usually after activity and before bed. Increase gabapentin to  in AM,  in PM and  at night If not better on Monday call and we will order epidural again.  Otherwise see me again in 6 weeks.

## 2017-02-10 NOTE — Assessment & Plan Note (Addendum)
Patient does have some spondylosis is likely contributing to most of her pain. Discussed with patient at great length. Patient will try conservative therapy. Patient will increase her gabapentin which she has responded to previously. We discussed the possibility of prednisone which patient somewhat declined. Given 2 injections to help with pain. We discussed the possibility of repeating epidurals and patient will consider it. Patient has had radiofrequency ablation previously as well. May also consider sacroiliac injections in the future. Patient will come back and see me again in 2 weeks after distributed and see how patient responds. Spent  25 minutes with patient face-to-face and had greater than 50% of counseling including as described above in assessment and plan.

## 2017-02-10 NOTE — Progress Notes (Signed)
[name] [address] [place], [place] [address] Phone: [phone] Subjective:    I'm seeing this patient by the request  of:  Dr. Jennette Kettle MD.   CC: low back strain f/u  [id number]  [name] is a 66 y.o. female coming in with complaint ofpain in her left posterior hip and back.Patient was seen previously and was having signs and symptoms that was more corresponding to a lumbar radicular symptoms. Started on low dose gabapentin as well as home exercises. Patient certainly having worsening pain again. Patient states it is unrelenting he may be the worse and it has ever been. Patient has had an injection in the sacroiliac as well as an epidural previously that did have some mild benefit previously. Patient states that the pain is so severe that is stopping her from daily activities and waking her up at night.   relevant past medical history also includes sacroiliac arthritis which patient has received injections last one 10/17CT pelvis taken 06/24/2016 was independently visualized by me showing moderate to severe degenerative disc disease of the lumbar spine.   Past Medical History:  Diagnosis Date  . Anemia   . Arthritis   . Borderline personality disorder   . CAD (coronary artery disease)    Catherization 11/18/09>nonobstructive CAD , normal LV function, EF 65%  . Cervicalgia   . Depression   . Diabetes mellitus   . Diabetes mellitus, type II (HCC)   . Diabetic gastroparesis (HCC)   . Diastolic CHF (HCC)    Grade I, on Echo 06/2013, EF 55-60%  . GERD (gastroesophageal reflux disease)   . Gout   . Headache(784.0)   . Health maintenance examination    Colonoscopy 2009> normal; Diabetic eye exam 12/2008. No diabetic retinopathy; Mammogram 11/10> No evidence of malignancy, DXA 03/14/13 : normal  . Hyperlipidemia   . Hypertension   . Iliotibial band syndrome   . Low back pain syndrome   . Nonorganic psychosis   . Peripheral neuropathy   . Right  carpal tunnel syndrome 09/13/2014  . SBO (small bowel obstruction) (HCC) 01/09/2013  . Sleep apnea    does not wear CPAP   . Vitamin B12 deficiency   . Vitamin D deficiency   . Wears dentures    upper  . Wears glasses    Past Surgical History:  Procedure Laterality Date  . ABDOMINAL HYSTERECTOMY    . APPENDECTOMY    . BOWEL RESECTION N/A 01/10/2013   Procedure: SMALL BOWEL RESECTION;  Surgeon: Lodema Pilot, DO;  Location: [facility];  Service: General;  Laterality: N/A;  . CARPAL TUNNEL RELEASE Right 09/13/2014   Procedure: RIGHT WRIST CARPAL TUNNEL RELEASE;  Surgeon: Eulas Post, MD;  Location: [facility];  Service: Orthopedics;  Laterality: Right;  . CARPAL TUNNEL RELEASE Left 02/28/2015   Procedure: LEFT CARPAL TUNNEL RELEASE;  Surgeon: Teryl Lucy, MD;  Location: [facility];  Service: Orthopedics;  Laterality: Left;  . HAND SURGERY  1992   rt  . HERNIA REPAIR  03/01/14   Lap incisional hernia repair w/mesh  . INCISIONAL HERNIA REPAIR N/A 03/01/2014   Procedure: LAPAROSCOPIC INCISIONAL HERNIA;  Surgeon: Axel Filler, MD;  Location: [facility];  Service: General;  Laterality: N/A;  . INSERTION OF MESH N/A 03/01/2014   Procedure: INSERTION OF MESH;  Surgeon: Axel Filler, MD;  Location: [facility];  Service: General;  Laterality: N/A;  . LAPAROTOMY N/A 01/10/2013   Procedure: EXPLORATORY LAPAROTOMY;  Surgeon:  Lodema Pilot, DO;  Location[facility];  Service: General;  Laterality: N/A;  . LYSIS OF ADHESION N/A 01/10/2013   Procedure: LYSIS OF ADHESION;  Surgeon: Lodema Pilot, DO;  Location: [facility];  Service: General;  Laterality: N/A;  . STERIOD INJECTION Left 09/13/2014   Procedure: STEROID INJECTION LEFT HAND;  Surgeon: Eulas Post, MD;  Location: [facility];  Service: Orthopedics;  Laterality: Left;   Social History   Social History  . Marital status: Married    Spouse name: N/A  . Number of children: N/A  . Years of education: N/A   Social  History Main Topics  . Smoking status: Former Smoker    Types: Cigarettes    Quit date: 10/25/2002  . Smokeless tobacco: Never Used  . Alcohol use No  . Drug use: No  . Sexual activity: Not Currently   Other Topics Concern  . None   Social History Narrative  . None   Allergies  Allergen Reactions  . Cimetidine Other (See Comments)    Breast swelling  . Indomethacin Diarrhea    Caused severe diarrhea with episodes of incontinance  . Penicillins Hives and Itching    Has patient had a PCN reaction causing immediate rash, facial/tongue/throat swelling, SOB or lightheadedness with hypotension: Yes Has patient had a PCN reaction causing severe rash involving mucus membranes or skin necrosis: No Has patient had a PCN reaction that required hospitalization No Has patient had a PCN reaction occurring within the last 10 years: No If all of the above answers are "NO", then may proceed with Cephalosporin use.  Other reaction(s): Delusions (intolerance)  . Sulfonamide Derivatives     unknown  . Sulfamethoxazole Other (See Comments)    Unknown reaction    Family History  Problem Relation Age of Onset  . Diabetes Mother   . Hypertension Mother   . Hyperlipidemia Mother   . Arthritis Mother   . Depression Mother   . Heart disease Father   . Diabetes Brother   . Heart disease Brother   . Diabetes Brother   . Heart disease Paternal Grandmother     Past medical history, social, surgical and family history all reviewed in electronic medical record.  No pertanent information unless stated regarding to the chief complaint.   Review of Systems: No headache, visual changes, nausea, vomiting, diarrhea, constipation, dizziness, abdominal pain, skin rash, fevers, chills, night sweats, weight loss, swollen lymph nodes, body aches, joint swelling,  chest pain, shortness of breath, mood changes.  + muscle pain.   Objective  Blood pressure 140/70, pulse 81, resp. rate 16, weight 183 lb (83 kg),  SpO2 96 %.  Systems examined below as of 02/10/17 General: NAD A&O x3 mood, affect normal  HEENT: Pupils equal, extraocular movements intact no nystagmus Respiratory: not short of breath at rest or with speaking Cardiovascular: No lower extremity edema, non tender Skin: Warm dry intact with no signs of infection or rash on extremities or on axial skeleton. Abdomen: Soft nontender, no masses Neuro: Cranial nerves  intact, neurovascularly intact in all extremities with 2+ DTRs and 2+ pulses. Lymph: No lymphadenopathy appreciated today  Gait normal with good balance and coordination.   MSK:  Non tender with full range of motion and good stability and symmetric strength and tone of shoulders, elbows, wrist, hip, knee and ankles bilaterally. Arthritic changes of multiple joints Back Exam:  Inspection: Loss of lordosis  Motion: Flexion 30 deg, Extension 15 deg, Side Bending to 35  deg bilaterally,  Rotation to 35 deg bilaterally  SLR laying: Negative  XSLR laying: Negative  Palpable tenderness: Continued tenderness in the per spinal musculature. More tenderness actually of the right ischial area. FABER: Still has some stiffness but improved from previous exam Sensory change: Gross sensation intact to all lumbar and sacral dermatomes.  Reflexes: 2+ at both patellar tendons, 2+ at achilles tendons, Babinski's downgoing.  Strength at foot  4+/5 strength but symmetric      Impression and Recommendations:     This case required medical decision making of moderate complexity.      Note: This dictation was prepared with Dragon dictation along with smaller phrase technology. Any transcriptional errors that result from this process are unintentional.

## 2017-02-10 NOTE — Progress Notes (Signed)
Pre-visit discussion using our clinic review tool. No additional management support is needed unless otherwise documented below in the visit note.  

## 2017-02-11 ENCOUNTER — Encounter: Payer: Self-pay | Admitting: Internal Medicine

## 2017-02-11 ENCOUNTER — Ambulatory Visit (INDEPENDENT_AMBULATORY_CARE_PROVIDER_SITE_OTHER): Payer: Medicare Other | Admitting: Internal Medicine

## 2017-02-11 DIAGNOSIS — I10 Essential (primary) hypertension: Secondary | ICD-10-CM | POA: Diagnosis not present

## 2017-02-11 DIAGNOSIS — Z79899 Other long term (current) drug therapy: Secondary | ICD-10-CM | POA: Diagnosis not present

## 2017-02-11 DIAGNOSIS — Z87891 Personal history of nicotine dependence: Secondary | ICD-10-CM | POA: Diagnosis not present

## 2017-02-11 DIAGNOSIS — N183 Chronic kidney disease, stage 3 (moderate): Secondary | ICD-10-CM

## 2017-02-11 DIAGNOSIS — Z8639 Personal history of other endocrine, nutritional and metabolic disease: Secondary | ICD-10-CM

## 2017-02-11 DIAGNOSIS — I129 Hypertensive chronic kidney disease with stage 1 through stage 4 chronic kidney disease, or unspecified chronic kidney disease: Secondary | ICD-10-CM

## 2017-02-11 MED ORDER — HYDROCHLOROTHIAZIDE 25 MG PO TABS: 25.0000 mg | ORAL_TABLET | Freq: Every day | ORAL | 1 refills | Status: DC

## 2017-02-11 MED ORDER — FUROSEMIDE 40 MG PO TABS: 40.0000 mg | ORAL_TABLET | Freq: Every day | ORAL | 1 refills | Status: DC

## 2017-02-11 MED ORDER — CLONIDINE HCL 0.3 MG/24HR TD PTWK: 0.3000 mg | MEDICATED_PATCH | TRANSDERMAL | 2 refills | Status: DC

## 2017-02-11 MED ORDER — METOPROLOL SUCCINATE ER 100 MG PO TB24: 100.0000 mg | ORAL_TABLET | Freq: Every morning | ORAL | 1 refills | Status: DC

## 2017-02-11 NOTE — Patient Instructions (Addendum)
[name],  I am going to make some changes to your blood pressure regimen today. I would like you to stop the losartan and the HCTZ today. I am going to increase the dose of your metoprolol to 100 mg daily and add a stronger diuretic call furosemide (lasix) 40 mg daily.    Please follow up in clinic in 1 month for follow up.

## 2017-02-11 NOTE — Progress Notes (Signed)
   CC: HTN follow up  HPI:  [name] is a 66 y.o. female with a past medical history listed below here today for follow up of her HTN.  For details of today's visit and the status of her chronic medical issues please refer to the assessment and plan.   Past Medical History:  Diagnosis Date  . Anemia   . Arthritis   . Borderline personality disorder   . CAD (coronary artery disease)    Catherization 11/18/09>nonobstructive CAD , normal LV function, EF 65%  . Cervicalgia   . Depression   . Diabetes mellitus   . Diabetes mellitus, type II (HCC)   . Diabetic gastroparesis (HCC)   . Diastolic CHF (HCC)    Grade I, on Echo 06/2013, EF 55-60%  . GERD (gastroesophageal reflux disease)   . Gout   . Headache(784.0)   . Health maintenance examination    Colonoscopy 2009> normal; Diabetic eye exam 12/2008. No diabetic retinopathy; Mammogram 11/10> No evidence of malignancy, DXA 03/14/13 : normal  . Hyperlipidemia   . Hypertension   . Iliotibial band syndrome   . Low back pain syndrome   . Nonorganic psychosis   . Peripheral neuropathy   . Right carpal tunnel syndrome 09/13/2014  . SBO (small bowel obstruction) (HCC) 01/09/2013  . Sleep apnea    does not wear CPAP   . Vitamin B12 deficiency   . Vitamin D deficiency   . Wears dentures    upper  . Wears glasses     Review of Systems:   See HPI  Physical Exam:  Vitals:   02/11/17 0920 02/11/17 0937  BP: (!) 160/61 (!) 164/59  Pulse: 78 72  Temp: 98.7 F (37.1 C)   TempSrc: Oral   SpO2: 100%   Weight: 188 lb 1.6 oz (85.3 kg)   Height:  (1.651 m)    Physical Exam  Constitutional: She is well-developed, well-nourished, and in no distress.  Cardiovascular: Normal rate and regular rhythm.   Pulmonary/Chest: Effort normal and breath sounds normal.  Musculoskeletal: She exhibits no edema.  Vitals reviewed.    Assessment & Plan:   See Encounters Tab for problem based charting.  Patient discussed with Dr.  Cyndie Chime

## 2017-02-11 NOTE — Assessment & Plan Note (Addendum)
BP Readings from Last 3 Encounters:  02/11/17 (!) 160/61  02/10/17 140/70  01/12/17 (!) 165/73    Lab Results  Component Value Date   NA 135 04/28/2016   K 5.4 (H) 04/28/2016   CREATININE 1.61 (H) 04/28/2016   [name] is here today for follow up with her hypertension. She is currently on clonidine patch 0.3 mg q24hr, metoprolol 50 mg daily, losartan 100 mg daily and HCTZ 12.5 mg daily that was started at last visit. Reports compliance with her medications. Denies any adverse side effects today.   BP is 160/61 today. Pulse 72 today.   GFR has been borderline stage III to stage IV on last checks; last BMET 04/2016 with Cr 1.6 with GFR 38 and K 5.4.   Assessment: HTN  Plan: Stop HCTZ, likely little utility with her GFR being stage III-IV range Stop Lisinopril, borderline hyperkalemia in the past Increase metoprolol to 100 mg daily Start Lasix 40 mg daily BMET today  RTC in 1 month

## 2017-02-11 NOTE — Progress Notes (Signed)
Medicine attending: Medical history, presenting problems, physical findings, and medications, reviewed with resident physician Dr Nathan Boswell on the day of the patient visit and I concur with his evaluation and management plan. 

## 2017-02-12 ENCOUNTER — Telehealth: Payer: Self-pay | Admitting: Internal Medicine

## 2017-02-12 ENCOUNTER — Emergency Department (HOSPITAL_COMMUNITY)
Admission: EM | Admit: 2017-02-12 | Discharge: 2017-02-12 | Disposition: A | Discharge status: Home / Self Care | Payer: Medicare Other | Attending: Emergency Medicine | Admitting: Emergency Medicine

## 2017-02-12 ENCOUNTER — Encounter: Payer: Self-pay | Admitting: Internal Medicine

## 2017-02-12 DIAGNOSIS — E1122 Type 2 diabetes mellitus with diabetic chronic kidney disease: Secondary | ICD-10-CM | POA: Diagnosis not present

## 2017-02-12 DIAGNOSIS — Z8639 Personal history of other endocrine, nutritional and metabolic disease: Secondary | ICD-10-CM | POA: Diagnosis not present

## 2017-02-12 DIAGNOSIS — Z794 Long term (current) use of insulin: Secondary | ICD-10-CM | POA: Insufficient documentation

## 2017-02-12 DIAGNOSIS — I2510 Atherosclerotic heart disease of native coronary artery without angina pectoris: Secondary | ICD-10-CM | POA: Insufficient documentation

## 2017-02-12 DIAGNOSIS — I5030 Unspecified diastolic (congestive) heart failure: Secondary | ICD-10-CM | POA: Diagnosis not present

## 2017-02-12 DIAGNOSIS — N183 Chronic kidney disease, stage 3 (moderate): Secondary | ICD-10-CM | POA: Insufficient documentation

## 2017-02-12 DIAGNOSIS — E875 Hyperkalemia: Secondary | ICD-10-CM | POA: Diagnosis not present

## 2017-02-12 DIAGNOSIS — I130 Hypertensive heart and chronic kidney disease with heart failure and stage 1 through stage 4 chronic kidney disease, or unspecified chronic kidney disease: Secondary | ICD-10-CM | POA: Diagnosis not present

## 2017-02-12 DIAGNOSIS — Z87891 Personal history of nicotine dependence: Secondary | ICD-10-CM | POA: Diagnosis not present

## 2017-02-12 DIAGNOSIS — E1140 Type 2 diabetes mellitus with diabetic neuropathy, unspecified: Secondary | ICD-10-CM | POA: Diagnosis not present

## 2017-02-12 DIAGNOSIS — Z7982 Long term (current) use of aspirin: Secondary | ICD-10-CM | POA: Insufficient documentation

## 2017-02-12 LAB — BASIC METABOLIC PANEL
ANION GAP: 13 (ref 5–15)
BUN: 39 mg/dL — ABNORMAL HIGH (ref 6–20)
CALCIUM: 10 mg/dL (ref 8.9–10.3)
CO2: 23 mmol/L (ref 22–32)
Chloride: 99 mmol/L — ABNORMAL LOW (ref 101–111)
Creatinine, Ser: 2.27 mg/dL — ABNORMAL HIGH (ref 0.44–1.00)
GFR calc non Af Amer: 21 mL/min — ABNORMAL LOW (ref >60)
GFR, EST AFRICAN AMERICAN: 25 mL/min — AB (ref >60)
Glucose, Bld: 146 mg/dL — ABNORMAL HIGH (ref 65–99)
POTASSIUM: 5 mmol/L (ref 3.5–5.1)
Sodium: 135 mmol/L (ref 135–145)

## 2017-02-12 NOTE — ED Triage Notes (Signed)
Patient was called from outpatient clinic this am and told to come to ED for EKG and very high potassium. Patient with no complaints, alert and oriented

## 2017-02-12 NOTE — ED Provider Notes (Signed)
[facility]-EMERGENCY DEPT Provider Note   CSN: [id number] Arrival date & time: 02/12/17  0944     History   Chief Complaint No chief complaint on file.   HPI [name] is a 66 y.o. female.  66 yo F with a chief complaint of hyperkalemia. This was noted incidentally on labs that were done yesterday. She was called and asked to come back to the ED for evaluation. She denies any symptoms. Denies any difficulty eating and drinking. Denies vomiting or diarrhea.   The history is provided by the patient.  Illness  This is a new problem. The current episode started yesterday. The problem occurs constantly. The problem has not changed since onset.Pertinent negatives include no chest pain, no headaches and no shortness of breath. Nothing aggravates the symptoms. Nothing relieves the symptoms. She has tried nothing for the symptoms. The treatment provided no relief.    Past Medical History:  Diagnosis Date  . Anemia   . Arthritis   . Borderline personality disorder   . CAD (coronary artery disease)    Catherization 11/18/09>nonobstructive CAD , normal LV function, EF 65%  . Cervicalgia   . Depression   . Diabetes mellitus   . Diabetes mellitus, type II (HCC)   . Diabetic gastroparesis (HCC)   . Diastolic CHF (HCC)    Grade I, on Echo 06/2013, EF 55-60%  . GERD (gastroesophageal reflux disease)   . Gout   . Headache(784.0)   . Health maintenance examination    Colonoscopy 2009> normal; Diabetic eye exam 12/2008. No diabetic retinopathy; Mammogram 11/10> No evidence of malignancy, DXA 03/14/13 : normal  . Hyperlipidemia   . Hypertension   . Iliotibial band syndrome   . Low back pain syndrome   . Nonorganic psychosis   . Peripheral neuropathy   . Right carpal tunnel syndrome 09/13/2014  . SBO (small bowel obstruction) (HCC) 01/09/2013  . Sleep apnea    does not wear CPAP   . Vitamin B12 deficiency   . Vitamin D deficiency   . Wears dentures    upper  . Wears glasses     Patient  Active Problem List   Diagnosis Date Noted  . Vaginal dryness 01/12/2017  . Arthritis of sacroiliac joint (HCC) 12/06/2016  . Posterior pain of hip, left 11/25/2016  . Ischial pain 06/21/2016  . Ischial bursitis 06/09/2016  . Vaginal discharge 05/05/2016  . Diastolic CHF (HCC) 01/26/2016  . At high risk for falls 12/25/2015  . Secondary hyperparathyroidism of renal origin (HCC) 08/08/2015  . CKD (chronic kidney disease) stage 3, GFR 30-59 ml/min 08/05/2015  . Preventative health care 06/18/2015  . Lateral epicondylitis of right elbow 04/17/2015  . Degenerative lumbar disc 12/30/2014  . Cough   . Degenerative joint disease involving multiple joints on both sides of body (right foot pain) 08/14/2014  . Chronic low back pain 04/15/2014  . Fibromyalgia syndrome 06/18/2013  . Abdominal pain, epigastric 03/29/2013  . Myalgia 12/21/2012  . Tension headache, chronic 05/24/2012  . Depression 07/14/2011  . Anxiety 06/02/2011  . Lumbar spondylosis with myelopathy 11/20/2010  . Coronary atherosclerosis 03/10/2010  . Dyslipidemia 12/11/2008  . Vitamin D deficiency 11/26/2008  . Controlled type 2 diabetes mellitus with insulin therapy (HCC) 05/25/2007  . PERIPHERAL NEUROPATHY 05/25/2007  . Essential hypertension 05/25/2007  . GERD 05/25/2007  . SYMPTOM, APNEA, SLEEP NOS 05/25/2007    Past Surgical History:  Procedure Laterality Date  . ABDOMINAL HYSTERECTOMY    . APPENDECTOMY    . BOWEL  RESECTION N/A 01/10/2013   Procedure: SMALL BOWEL RESECTION;  Surgeon: Lodema Pilot, DO;  Location: [facility] OR;  Service: General;  Laterality: N/A;  . CARPAL TUNNEL RELEASE Right 09/13/2014   Procedure: RIGHT WRIST CARPAL TUNNEL RELEASE;  Surgeon: Eulas Post, MD;  Location: [facility];  Service: Orthopedics;  Laterality: Right;  . CARPAL TUNNEL RELEASE Left 02/28/2015   Procedure: LEFT CARPAL TUNNEL RELEASE;  Surgeon: Teryl Lucy, MD;  Location: [facility];  Service:  Orthopedics;  Laterality: Left;  . HAND SURGERY  1992   rt  . HERNIA REPAIR  03/01/14   Lap incisional hernia repair w/mesh  . INCISIONAL HERNIA REPAIR N/A 03/01/2014   Procedure: LAPAROSCOPIC INCISIONAL HERNIA;  Surgeon: Axel Filler, MD;  Location: [facility];  Service: General;  Laterality: N/A;  . INSERTION OF MESH N/A 03/01/2014   Procedure: INSERTION OF MESH;  Surgeon: Axel Filler, MD;  Location: [facility];  Service: General;  Laterality: N/A;  . LAPAROTOMY N/A 01/10/2013   Procedure: EXPLORATORY LAPAROTOMY;  Surgeon: Lodema Pilot, DO;  Location: [facility];  Service: General;  Laterality: N/A;  . LYSIS OF ADHESION N/A 01/10/2013   Procedure: LYSIS OF ADHESION;  Surgeon: Lodema Pilot, DO;  Location: [facility];  Service: General;  Laterality: N/A;  . STERIOD INJECTION Left 09/13/2014   Procedure: STEROID INJECTION LEFT HAND;  Surgeon: Eulas Post, MD;  Location: [facility];  Service: Orthopedics;  Laterality: Left;    OB History    No data available       Home Medications    Prior to Admission medications   Medication Sig Start Date End Date Taking? Authorizing Provider  ACCU-CHEK AVIVA PLUS test strip TEST UPTO 4 TIMES DAILY 06/17/15   Denton Brick, MD  ACCU-CHEK FASTCLIX LANCETS MISC TEST five times a day 12/02/15   Denton Brick, MD  acetaminophen (TYLENOL) 500 MG tablet Take 325 mg by mouth.    Historical Provider, MD  AMITIZA 24 MCG capsule  06/15/16   Historical Provider, MD  aspirin 81 MG chewable tablet Chew 81 mg by mouth daily.    Historical Provider, MD  buPROPion (WELLBUTRIN XL) 150 MG 24 hr tablet Take 3 tablets (450 mg total) by mouth daily. 10/20/16   Archer Asa, MD  cloNIDine (CATAPRES - DOSED IN MG/24 HR) 0.3 mg/24hr patch Place 1 patch (0.3 mg total) onto the skin once a week. 02/11/17   Valentino Nose, MD  dexlansoprazole (DEXILANT) 60 MG capsule Take 1 capsule (60 mg total) by mouth daily. For reflux 11/13/14   Shuvon B Rankin, NP  diclofenac sodium  (VOLTAREN) 1 % GEL Apply 2 g topically 4 (four) times daily. 04/17/15   Courtney Paris, MD  estrogens, conjugated, (PREMARIN) 0.3 MG tablet Take 1 tablet (0.3 mg total) by mouth daily. Take daily for 21 days then do not take for 7 days. 01/12/17   Denton Brick, MD  furosemide (LASIX) 40 MG tablet Take 1 tablet (40 mg total) by mouth daily. 02/11/17   Valentino Nose, MD  gabapentin (NEURONTIN) 100 MG capsule Take 1 capsule (100 mg total) by mouth 2 (two) times daily. 02/10/17   Judi Saa, DO  gabapentin (NEURONTIN) 300 MG capsule Take 1 capsule (300 mg total) by mouth at bedtime. 01/03/17   Judi Saa, DO  Insulin Glargine (LANTUS SOLOSTAR) 100 UNIT/ML Solostar Pen Inject 45 Units into the skin daily. 02/06/16   Historical Provider, MD  Insulin Pen Needle (  BD PEN NEEDLE NANO U/F) 32G X 4 MM MISC Use to inject insulin into the skin 05/10/16   Denton Brick, MD  JANUVIA 50 MG tablet take 1 tablet by mouth once daily 07/23/16   Denton Brick, MD  LINZESS 145 MCG CAPS capsule Take 145 mcg by mouth daily. 06/07/16   Historical Provider, MD  metoprolol succinate (TOPROL-XL) 100 MG 24 hr tablet Take 1 tablet (100 mg total) by mouth every morning. 02/11/17   Valentino Nose, MD  promethazine (PHENERGAN) 25 MG tablet Take 25 mg by mouth. 05/03/16   Historical Provider, MD  rosuvastatin (CRESTOR) 10 MG tablet Take 1 tablet (10 mg total) by mouth daily. 01/12/17   Denton Brick, MD  sertraline (ZOLOFT) 100 MG tablet 2 qam 10/20/16   Archer Asa, MD  sitaGLIPtin (JANUVIA) 50 MG tablet Take 50 mg by mouth. 06/18/15   Historical Provider, MD  Vitamin D, Ergocalciferol, (DRISDOL) 50000 units CAPS capsule Take 1 capsule (50,000 Units total) by mouth every 7 (seven) days. 12/06/16   Judi Saa, DO    Family History Family History  Problem Relation Age of Onset  . Diabetes Mother   . Hypertension Mother   . Hyperlipidemia Mother   . Arthritis Mother   . Depression Mother   . Heart disease Father   .  Diabetes Brother   . Heart disease Brother   . Diabetes Brother   . Heart disease Paternal Grandmother     Social History Social History  Substance Use Topics  . Smoking status: Former Smoker    Types: Cigarettes    Quit date: 10/25/2002  . Smokeless tobacco: Never Used  . Alcohol use No     Allergies   Cimetidine; Indomethacin; Penicillins; Sulfonamide derivatives; and Sulfamethoxazole   Review of Systems Review of Systems  Constitutional: Negative for chills and fever.  HENT: Negative for congestion and rhinorrhea.   Eyes: Negative for redness and visual disturbance.  Respiratory: Negative for shortness of breath and wheezing.   Cardiovascular: Negative for chest pain and palpitations.  Gastrointestinal: Negative for nausea and vomiting.  Genitourinary: Negative for dysuria and urgency.  Musculoskeletal: Negative for arthralgias and myalgias.  Skin: Negative for pallor and wound.  Neurological: Negative for dizziness and headaches.     Physical Exam Updated Vital Signs BP (!) 183/74   Pulse 64   Temp 98 F (36.7 C) (Oral)   Resp 16   SpO2 96%   Physical Exam  Constitutional: She is oriented to person, place, and time. She appears well-developed and well-nourished. No distress.  HENT:  Head: Normocephalic and atraumatic.  Eyes: EOM are normal. Pupils are equal, round, and reactive to light.  Neck: Normal range of motion. Neck supple.  Cardiovascular: Normal rate and regular rhythm.   Pulmonary/Chest: Effort normal.  Abdominal: She exhibits no distension.  Musculoskeletal: She exhibits no edema or tenderness.  Neurological: She is alert and oriented to person, place, and time.  Skin: Skin is warm and dry. She is not diaphoretic.  Psychiatric: She has a normal mood and affect. Her behavior is normal.  Nursing note and vitals reviewed.    ED Treatments / Results  Labs (all labs ordered are listed, but only abnormal results are displayed) Labs Reviewed    BASIC METABOLIC PANEL - Abnormal; Notable for the following:       Result Value   Chloride 99 (*)    Glucose, Bld 146 (*)    BUN 39 (*)  Creatinine, Ser 2.27 (*)    GFR calc non Af Amer 21 (*)    GFR calc Af Amer 25 (*)    All other components within normal limits    EKG  EKG Interpretation  Date/Time:  Saturday February 12 2017 09:56:42 EDT Ventricular Rate:  73 PR Interval:  136 QRS Duration: 70 QT Interval:  382 QTC Calculation: 420 R Axis:   66 Text Interpretation:  Normal sinus rhythm Normal ECG No significant change since last tracing Confirmed by Haizlee Henton MD, DANIEL ([id number]) on 02/12/2017 11:23:05 AM       Radiology No results found.  Procedures Procedures (including critical care time)  Medications Ordered in ED Medications - No data to display   Initial Impression / Assessment and Plan / ED Course  I have reviewed the triage vital signs and the nursing notes.  Pertinent labs & imaging results that were available during my care of the patient were reviewed by me and considered in my medical decision making (see chart for details).     66 yo F With a chief complaint of hyperkalemia. Check today without elevation. Mild bump in her creatinine. Discussed following up with her family physician for recheck next week.  12:26 PM:  I have discussed the diagnosis/risks/treatment options with the patient and believe the pt to be eligible for discharge home to follow-up with PCP. We also discussed returning to the ED immediately if new or worsening sx occur. We discussed the sx which are most concerning (e.g., sudden worsening pain, fever, inability to tolerate by mouth) that necessitate immediate return. Medications administered to the patient during their visit and any new prescriptions provided to the patient are listed below.  Medications given during this visit Medications - No data to display   The patient appears reasonably screen and/or stabilized for discharge and  I doubt any other medical condition or other [facility] requiring further screening, evaluation, or treatment in the ED at this time prior to discharge.    Final Clinical Impressions(s) / ED Diagnoses   Final diagnoses:  Hx of hyperkalemia    New Prescriptions Discharge Medication List as of 02/12/2017 11:41 AM       Melene Plan, DO 02/12/17 1226

## 2017-02-12 NOTE — Telephone Encounter (Signed)
  Reason for call:   I placed an outgoing call to [name] at [phone] AM regarding critical hyperkalemia of 6.7 on her recent metabolic panel checked in clinic yesterday.   Assessment/ Plan:   Patient's PCP Dr. Danella Penton informed of these results.  I discussed this finding with [name] and the Losartan which was discontinued at the clinic visit yesterday by Dr. Karma Greaser was likely precipitating her worsened renal function and hyperkalemia in the setting of chronic medical renal disease. I expect this to improve with stopping the medication.  In the meantime I recommended repeat evaluation this weekend at urgent care or the ED for a repeated potassium level and EKG to make sure she has no cardiac electrical changes placing her at risk for an adverse event.  She expressed understanding of this plan and said she was fine going to an Urgent Care visit today for evaluation. She can then follow up in our clinic next week.    Fuller Plan, MD   02/12/2017, 7:13 AM

## 2017-02-13 NOTE — Telephone Encounter (Signed)
   Reason for call:   I received a call from [name] at 11:17 AM indicating below.   Pertinent Data:   Patient was concerned as her BP was slightly elevated. She has had some changes to her meds recently. She was seen in the clinic on the 20th, HCTZ was stopped, and losartan was also stopped recently. She had hyperkalemia and was seen in the ER yesterday where her K had normalized.   Said she Checked her BP twice. This AM it was 173/77, and currently it was  163/71   No headaches or dizziness. No n/v.   She has been compliant with the rest of her BP regimen that includes metoprolol and lasix  She has been out of her clonidine patch. She had requested a refill last Tuesday but did not receive the refill   Assessment / Plan / Recommendations:   I explained to keep checking her BP few times a day since currently many of her BP medications have been changed and it will take few weeks for her to be on an optimal regimen.   Advised to call back if BP >180   Advised to call to the clinic for appt tomorrow to optimize her blood pressure medications. She will likely need addition of another agent- perhaps amlodipine   As always, pt is advised that if symptoms worsen or new symptoms arise, they should go to an urgent care facility or to to ER for further evaluation.   Deneise Lever, MD   02/13/2017, 11:10 AM

## 2017-02-14 ENCOUNTER — Telehealth: Payer: Self-pay | Admitting: Internal Medicine

## 2017-02-14 ENCOUNTER — Other Ambulatory Visit: Payer: Self-pay

## 2017-02-14 DIAGNOSIS — R350 Frequency of micturition: Secondary | ICD-10-CM | POA: Diagnosis not present

## 2017-02-14 DIAGNOSIS — M533 Sacrococcygeal disorders, not elsewhere classified: Secondary | ICD-10-CM

## 2017-02-14 DIAGNOSIS — R109 Unspecified abdominal pain: Secondary | ICD-10-CM | POA: Diagnosis not present

## 2017-02-14 DIAGNOSIS — N952 Postmenopausal atrophic vaginitis: Secondary | ICD-10-CM | POA: Diagnosis not present

## 2017-02-14 LAB — BMP8+ANION GAP
ANION GAP: 18 mmol/L (ref 10.0–18.0)
BUN/Creatinine Ratio: 16 (ref 12–28)
BUN: 39 mg/dL — AB (ref 8–27)
CALCIUM: 9.3 mg/dL (ref 8.7–10.3)
CHLORIDE: 99 mmol/L (ref 96–106)
CO2: 22 mmol/L (ref 18–29)
Creatinine, Ser: 2.39 mg/dL — ABNORMAL HIGH (ref 0.57–1.00)
GFR calc Af Amer: 24 mL/min/{1.73_m2} — ABNORMAL LOW (ref >59)
GFR calc non Af Amer: 21 mL/min/{1.73_m2} — ABNORMAL LOW (ref >59)
GLUCOSE: 143 mg/dL — AB (ref 65–99)
POTASSIUM: 6.7 mmol/L — AB (ref 3.5–5.2)
Sodium: 139 mmol/L (ref 134–144)

## 2017-02-14 NOTE — Telephone Encounter (Signed)
SI joint epidural order placed per Dr. Katrinka Blazing.

## 2017-02-14 NOTE — Telephone Encounter (Signed)
Pt called in said that she is still in pain and would like for DR Katrinka Blazing to go ahead and put order in for  epidural

## 2017-02-14 NOTE — Telephone Encounter (Signed)
Pt calls and states she checked her BP this am just after taking morning meds, BP 185/74. She states she called and spoke to resident on call this weekend and he told her to schedule appt asap in clinic, times will not be convienient  Until later in week, she will continue to monitor BP and call if it goes up

## 2017-02-14 NOTE — Telephone Encounter (Signed)
Needs to speak with nurse please call

## 2017-02-15 ENCOUNTER — Encounter (HOSPITAL_COMMUNITY): Payer: Self-pay | Admitting: Clinical

## 2017-02-15 ENCOUNTER — Ambulatory Visit (INDEPENDENT_AMBULATORY_CARE_PROVIDER_SITE_OTHER): Payer: Medicare Other | Admitting: Clinical

## 2017-02-15 DIAGNOSIS — F411 Generalized anxiety disorder: Secondary | ICD-10-CM

## 2017-02-15 DIAGNOSIS — F331 Major depressive disorder, recurrent, moderate: Secondary | ICD-10-CM | POA: Diagnosis not present

## 2017-02-15 NOTE — Progress Notes (Signed)
   THERAPIST PROGRESS NOTE  Session Time: 10:06 - 10:55  Participation Level: Active  Behavioral Response: CasualAlertNA  Type of Therapy: Individual Therapy  Treatment Goals addressed: Improve Psychiatric Symptoms, Calming Skills, Interpersonal Relationship Skills  Interventions: Motivational Interviewing, Grounding & Mindfulness Techniques  Summary: [name] is [name] 66 y.o. female who presents with Major Depressive Disorder, Generalized Anxiety Disorder   Suicidal/Homicidal: No -without intent/plan  Therapist Response:  [name] met with clinician for an individual session. [name] shared about her psychiatric symptoms and her current life events. [name] shared that she has had some good days and some rough days with her symptoms. Clinician asked open ended questions and She shared that she has been diligently following through with her daily tasks and working on self care which has been useful. She shared that she tries to follow through with the skills taught in therapy. She shared her rough days which increased her depression had to do with issues with her physical health. Clinician asked open ended questions and she shared her experience and her negative thoughts. She shared that when she hurts she has [name] more difficult time challenging her negative thoughts. Client and clinician discussed ways to intrupt her unhelpful thought and to calm herself. [name] also shared about some challenges in her relationships. Client and clinician discussed healthy interpersonal relationship skills. [name] and clinician discussed her grounding and mindfulness skills and how to use them with her physical issues.   Plan: Return again in 3-4 weeks.  Diagnosis:     Axis I: Major Depressive Disorder, Generalized Anxiety Disorder     [name] Breighner A, LCSW 02/15/2017

## 2017-02-17 ENCOUNTER — Other Ambulatory Visit: Payer: Self-pay | Admitting: Family Medicine

## 2017-02-17 ENCOUNTER — Ambulatory Visit
Admission: RE | Admit: 2017-02-17 | Discharge: 2017-02-17 | Disposition: A | Discharge status: Home / Self Care | Payer: Medicare Other | Source: Ambulatory Visit | Attending: Family Medicine | Admitting: Family Medicine

## 2017-02-17 DIAGNOSIS — M533 Sacrococcygeal disorders, not elsewhere classified: Secondary | ICD-10-CM

## 2017-02-17 MED ORDER — METHYLPREDNISOLONE ACETATE 40 MG/ML INJ SUSP (RADIOLOG
120.0000 mg | Freq: Once | INTRAMUSCULAR | Status: AC
  Administered 2017-02-17: 120 mg via INTRA_ARTICULAR

## 2017-02-17 MED ORDER — IOPAMIDOL (ISOVUE-M 200) INJECTION 41%
1.0000 mL | Freq: Once | INTRAMUSCULAR | Status: AC
  Administered 2017-02-17: 1 mL via INTRA_ARTICULAR

## 2017-02-17 NOTE — Discharge Instructions (Signed)

## 2017-02-21 ENCOUNTER — Ambulatory Visit: Payer: Self-pay | Admitting: Family Medicine

## 2017-02-21 ENCOUNTER — Other Ambulatory Visit: Payer: Self-pay | Admitting: Family Medicine

## 2017-02-22 ENCOUNTER — Ambulatory Visit: Payer: Medicare Other | Attending: Family Medicine

## 2017-02-22 DIAGNOSIS — M2560 Stiffness of unspecified joint, not elsewhere classified: Secondary | ICD-10-CM | POA: Insufficient documentation

## 2017-02-22 DIAGNOSIS — M25659 Stiffness of unspecified hip, not elsewhere classified: Secondary | ICD-10-CM | POA: Diagnosis not present

## 2017-02-22 DIAGNOSIS — R293 Abnormal posture: Secondary | ICD-10-CM | POA: Diagnosis not present

## 2017-02-22 DIAGNOSIS — M5416 Radiculopathy, lumbar region: Secondary | ICD-10-CM | POA: Diagnosis not present

## 2017-02-22 DIAGNOSIS — M6281 Muscle weakness (generalized): Secondary | ICD-10-CM | POA: Diagnosis not present

## 2017-02-22 DIAGNOSIS — R2681 Unsteadiness on feet: Secondary | ICD-10-CM | POA: Insufficient documentation

## 2017-02-22 NOTE — Patient Instructions (Signed)
From cabinet knee to chest  stretch ,posterior pelvic tilt and lower trunk rotation. 2x/day 20-30 sec stretrch. Or 10 sec hold with tilt

## 2017-02-22 NOTE — Therapy (Signed)
[facility] [address] [place], [place], [address] Phone: [phone]   Fax:  [phone]  Physical Therapy Evaluation  Patient Details  Name: [name] MRN: [id number] Date of Birth: [birth year] Referring Provider: Antoine Primas DO  Encounter Date: 02/22/2017      PT End of Session - 02/22/17 1042    Visit Number 1   Number of Visits 12   Date for PT Re-Evaluation 04/01/17   Authorization Type [employer] MCR   PT Start Time 1100   PT Stop Time 1142   PT Time Calculation (min) 42 min   Activity Tolerance Patient tolerated treatment well;No increased pain   Behavior During Therapy WFL for tasks assessed/performed      Past Medical History:  Diagnosis Date  . Anemia   . Arthritis   . Borderline personality disorder   . CAD (coronary artery disease)    Catherization 11/18/09>nonobstructive CAD , normal LV function, EF 65%  . Cervicalgia   . Depression   . Diabetes mellitus   . Diabetes mellitus, type II (HCC)   . Diabetic gastroparesis (HCC)   . Diastolic CHF (HCC)    Grade I, on Echo 06/2013, EF 55-60%  . GERD (gastroesophageal reflux disease)   . Gout   . Headache(784.0)   . Health maintenance examination    Colonoscopy 2009> normal; Diabetic eye exam 12/2008. No diabetic retinopathy; Mammogram 11/10> No evidence of malignancy, DXA 03/14/13 : normal  . Hyperlipidemia   . Hypertension   . Iliotibial band syndrome   . Low back pain syndrome   . Nonorganic psychosis   . Peripheral neuropathy   . Right carpal tunnel syndrome 09/13/2014  . SBO (small bowel obstruction) (HCC) 01/09/2013  . Sleep apnea    does not wear CPAP   . Vitamin B12 deficiency   . Vitamin D deficiency   . Wears dentures    upper  . Wears glasses     Past Surgical History:  Procedure Laterality Date  . ABDOMINAL HYSTERECTOMY    . APPENDECTOMY    . BOWEL RESECTION N/A 01/10/2013   Procedure: SMALL BOWEL RESECTION;  Surgeon: Lodema Pilot, DO;   [facility]: [facility];  Service: General;  Laterality: N/A;  . CARPAL TUNNEL RELEASE Right 09/13/2014   Procedure: RIGHT WRIST CARPAL TUNNEL RELEASE;  Surgeon: Eulas Post, MD;  Location: [facility];  Service: Orthopedics;  Laterality: Right;  . CARPAL TUNNEL RELEASE Left 02/28/2015   Procedure: LEFT CARPAL TUNNEL RELEASE;  Surgeon: Teryl Lucy, MD;  Location: [facility];  Service: Orthopedics;  Laterality: Left;  . HAND SURGERY  1992   rt  . HERNIA REPAIR  03/01/14   Lap incisional hernia repair w/mesh  . INCISIONAL HERNIA REPAIR N/A 03/01/2014   Procedure: LAPAROSCOPIC INCISIONAL HERNIA;  Surgeon: Axel Filler, MD;  Location: [facility];  Service: General;  Laterality: N/A;  . INSERTION OF MESH N/A 03/01/2014   Procedure: INSERTION OF MESH;  Surgeon: Axel Filler, MD;  Location: [facility];  Service: General;  Laterality: N/A;  . LAPAROTOMY N/A 01/10/2013   Procedure: EXPLORATORY LAPAROTOMY;  Surgeon: Lodema Pilot, DO;  Location: [facility];  Service: General;  Laterality: N/A;  . LYSIS OF ADHESION N/A 01/10/2013   Procedure: LYSIS OF ADHESION;  Surgeon: Lodema Pilot, DO;  Location: [facility];  Service: General;  Laterality: N/A;  . STERIOD INJECTION Left 09/13/2014   Procedure: STEROID INJECTION LEFT HAND;  Surgeon: Eulas Post, MD;  Location: [facility];  Service: Orthopedics;  Laterality: Left;    There were no vitals filed for this visit.       Subjective Assessment - 02/22/17 1015    Subjective She reports back pain for many years.   She has been to our clinic before for back pain  with limited benefit.    She feels pain is worse in past couple of years.    She does some silver sneakers off and on.  Last time a month ago.    Pertinent History injections helped hip but not back    How long can you sit comfortably? Gets up after 30 min   How long can you stand comfortably? 20 min   How long can you walk comfortably? 1 block    Patient Stated Goals She  wants to ease pain.      Currently in Pain? Yes   Pain Score 7    Pain Location Back   Pain Orientation Lower;Right   Pain Descriptors / Indicators Aching   Pain Type Chronic pain   Pain Onset More than a month ago   Pain Frequency Constant  ice and eally still may have no pain   Aggravating Factors  Activity on feet,  bending , getting out of chair   Pain Relieving Factors Meds    Multiple Pain Sites No            [facility] PT Assessment - 02/22/17 0001      Assessment   Medical Diagnosis lumbar radiculopathy   Referring Provider Antoine Primas DO   Onset Date/Surgical Date --  pain worse ofer last 2 years , LBP chronic beyond this   Next MD Visit end of MAy   Prior Therapy YEs 4-5 years ago with limited benefit     Precautions   Precautions None     Restrictions   Weight Bearing Restrictions No     Balance Screen   Has the patient fallen in the past 6 months Yes   How many times? 1  bending over to trash can, bumped head on counter   Has the patient had a decrease in activity level because of a fear of falling?  No   Is the patient reluctant to leave their home because of a fear of falling?  No     Prior Function   Level of Independence Independent     Cognition   Overall Cognitive Status Within Functional Limits for tasks assessed     Observation/Other Assessments   Focus on Therapeutic Outcomes (FOTO)  53% limited     Posture/Postural Control   Posture Comments slumped sitting , sway back standing, IR at shoulders , forward head     ROM / Strength   AROM / PROM / Strength AROM;Strength;PROM     AROM   Overall AROM Comments LE WNL     AROM Assessment Site Lumbar   Lumbar Flexion 40 degrees  I observed > ROM bending in lobby   Lumbar Extension 15   Lumbar - Right Side Bend 20   Lumbar - Left Side Bend 20     PROM   Overall PROM Comments Prone hip IR decr LT at 30 degrees RT 20     Strength   Overall Strength Comments LE WNL and poor core strength      Flexibility   Soft Tissue Assessment /Muscle Length yes   Hamstrings 45-50 degrees bilaterally     Palpation   Palpation comment Stiffnes of  lower spine RT/LT  RT lower pine rotated in extension.                             PT Education - Mar 10, 2017 1042    Education provided Yes   Education Details POC   Person(s) Educated Patient   Methods Explanation;Tactile cues;Verbal cues;Handout   Comprehension Returned demonstration;Verbalized understanding          PT Short Term Goals - Mar 10, 2017 1055      PT SHORT TERM GOAL #1   Title She will be independent with inital HEP   Time 2   Period Weeks   Status New     PT SHORT TERM GOAL #2   Title she will report pain decreased 25% or more   Time 3   Period Weeks   Status New     PT SHORT TERM GOAL #3   Title she will improve lumbar flexion to 50 degrees to demo increased flexibility   Time 3   Period Weeks   Status New           PT Long Term Goals - Mar 10, 2017 1057      PT LONG TERM GOAL #1   Title She will be independent with all hEP issued   Time 6   Period Weeks   Status New     PT LONG TERM GOAL #2   Title She will report pain decreased 50% or rmoe with walking > 1 block   Time 6   Period Weeks   Status New     PT LONG TERM GOAL #3   Title She will be able to stand for 30 min or more with pain 2-3 max    Time 6   Period Weeks   Status New     PT LONG TERM GOAL #4   Title She will report pain as intermittant with activity during the day   Time 6   Period Weeks   Status New               Plan - 03/10/17 1043    Clinical Impression Statement [name] presents for moderate complexity eval with chronic back pain , stiffness of spine and hamstrings  and core weakness    Rehab Potential Good   PT Frequency 2x / week   PT Duration 6 weeks   PT Treatment/Interventions Electrical Stimulation;Moist Heat;Patient/family education;Passive range of motion;Manual  techniques;Taping;Therapeutic activities;Dry needling;Therapeutic exercise   PT Next Visit Plan Progress ROM and core strength exercises   PT Home Exercise Plan PPT, LTR, knee to chest   Consulted and Agree with Plan of Care Patient      Patient will benefit from skilled therapeutic intervention in order to improve the following deficits and impairments:  Decreased range of motion, Pain, Decreased activity tolerance, Postural dysfunction, Decreased strength, Increased muscle spasms  Visit Diagnosis: Radiculopathy, lumbar region - Plan: PT plan of care cert/re-cert  Muscle weakness (generalized) - Plan: PT plan of care cert/re-cert  Joint stiffness of spine - Plan: PT plan of care cert/re-cert  Stiffness of hip joint, unspecified laterality - Plan: PT plan of care cert/re-cert  Abnormal posture - Plan: PT plan of care cert/re-cert      G-Codes - 2017-03-10 1012    Functional Assessment Tool Used (Outpatient Only) FOTO 53% limited   Functional Limitation Mobility: Walking and moving around   Mobility: Walking and Moving Around Current Status ([id number]) At  least 40 percent but less than 60 percent impaired, limited or restricted   Mobility: Walking and Moving Around Goal Status [phone]) At least 20 percent but less than 40 percent impaired, limited or restricted       Problem List Patient Active Problem List   Diagnosis Date Noted  . Vaginal dryness 01/12/2017  . Arthritis of sacroiliac joint (HCC) 12/06/2016  . Posterior pain of hip, left 11/25/2016  . Ischial pain 06/21/2016  . Ischial bursitis 06/09/2016  . Vaginal discharge 05/05/2016  . Hepatic steatosis 05/03/2016  . History of Helicobacter pylori infection 05/03/2016  . Diarrhea 02/10/2016  . Nausea 02/10/2016  . Diastolic CHF (HCC) 01/26/2016  . At high risk for falls 12/25/2015  . Secondary hyperparathyroidism of renal origin (HCC) 08/08/2015  . CKD (chronic kidney disease) stage 3, GFR 30-59 ml/min 08/05/2015  .  Preventative health care 06/18/2015  . Lateral epicondylitis of right elbow 04/17/2015  . Degenerative lumbar disc 12/30/2014  . Cough   . Degenerative joint disease involving multiple joints on both sides of body (right foot pain) 08/14/2014  . Chronic low back pain 04/15/2014  . Fibromyalgia syndrome 06/18/2013  . Abdominal pain, epigastric 03/29/2013  . Myalgia 12/21/2012  . Tension headache, chronic 05/24/2012  . Depression 07/14/2011  . Anxiety 06/02/2011  . Lumbar spondylosis with myelopathy 11/20/2010  . Coronary atherosclerosis 03/10/2010  . Dyslipidemia 12/11/2008  . Vitamin D deficiency 11/26/2008  . Controlled type 2 diabetes mellitus with insulin therapy (HCC) 05/25/2007  . PERIPHERAL NEUROPATHY 05/25/2007  . Essential hypertension 05/25/2007  . GERD 05/25/2007  . SYMPTOM, APNEA, SLEEP NOS 05/25/2007    Caprice Red  PT 02/22/2017, 11:02 AM  [facility] [address] [place], [place], [address] Phone: [phone]   Fax:  [phone]  Name: [name] MRN: [id number] Date of Birth: Apr 02, 1951

## 2017-03-01 ENCOUNTER — Encounter: Payer: Self-pay | Admitting: Physical Therapy

## 2017-03-01 ENCOUNTER — Ambulatory Visit: Payer: Medicare Other | Admitting: Physical Therapy

## 2017-03-01 VITALS — BP 139/70 | HR 62

## 2017-03-01 DIAGNOSIS — M25659 Stiffness of unspecified hip, not elsewhere classified: Secondary | ICD-10-CM | POA: Diagnosis not present

## 2017-03-01 DIAGNOSIS — R293 Abnormal posture: Secondary | ICD-10-CM

## 2017-03-01 DIAGNOSIS — R2681 Unsteadiness on feet: Secondary | ICD-10-CM | POA: Diagnosis not present

## 2017-03-01 DIAGNOSIS — M5416 Radiculopathy, lumbar region: Secondary | ICD-10-CM | POA: Diagnosis not present

## 2017-03-01 DIAGNOSIS — M2560 Stiffness of unspecified joint, not elsewhere classified: Secondary | ICD-10-CM | POA: Diagnosis not present

## 2017-03-01 DIAGNOSIS — M6281 Muscle weakness (generalized): Secondary | ICD-10-CM

## 2017-03-01 NOTE — Patient Instructions (Addendum)

## 2017-03-01 NOTE — Therapy (Signed)
[place], [place], [address] Phone: [phone]   Fax:  [phone]  Physical Therapy Treatment  Patient Details  Name: [name] MRN: [id number] Date of Birth: Apr 21, 1951 Referring Provider: Hulan Saas DO  Encounter Date: 03/01/2017      PT End of Session - 03/01/17 1108    Visit Number 2   Number of Visits 12   Date for PT Re-Evaluation 04/01/17   PT Start Time 1017   PT Stop Time 1111   PT Time Calculation (min) 54 min   Activity Tolerance Patient tolerated treatment well   Behavior During Therapy [facility] for tasks assessed/performed      Past Medical History:  Diagnosis Date  . Anemia   . Arthritis   . Borderline personality disorder   . CAD (coronary artery disease)    Catherization 11/18/09>nonobstructive CAD , normal LV function, EF 65%  . Cervicalgia   . Depression   . Diabetes mellitus   . Diabetes mellitus, type II ([place])   . Diabetic gastroparesis ([place])   . Diastolic CHF ([place])    Grade I, on Echo 06/2013, EF 55-60%  . GERD (gastroesophageal reflux disease)   . Gout   . Headache(784.0)   . Health maintenance examination    Colonoscopy 2009> normal; Diabetic eye exam 12/2008. No diabetic retinopathy; Mammogram 11/10> No evidence of malignancy, DXA 03/14/13 : normal  . Hyperlipidemia   . Hypertension   . Iliotibial band syndrome   . Low back pain syndrome   . Nonorganic psychosis   . Peripheral neuropathy   . Right carpal tunnel syndrome 09/13/2014  . SBO (small bowel obstruction) ([place]) 01/09/2013  . Sleep apnea    does not wear CPAP   . Vitamin B12 deficiency   . Vitamin D deficiency   . Wears dentures    upper  . Wears glasses     Past Surgical History:  Procedure Laterality Date  . ABDOMINAL HYSTERECTOMY    . APPENDECTOMY    . BOWEL RESECTION N/A 01/10/2013   Procedure: SMALL BOWEL RESECTION;  Surgeon: Madilyn Hook, DO;  Location: [facility];  Service: General;  Laterality:  N/A;  . CARPAL TUNNEL RELEASE Right 09/13/2014   Procedure: RIGHT WRIST CARPAL TUNNEL RELEASE;  Surgeon: Johnny Bridge, MD;  Location: [facility];  Service: Orthopedics;  Laterality: Right;  . CARPAL TUNNEL RELEASE Left 02/28/2015   Procedure: LEFT CARPAL TUNNEL RELEASE;  Surgeon: Marchia Bond, MD;  Location: [facility];  Service: Orthopedics;  Laterality: Left;  . HAND SURGERY  1992   rt  . HERNIA REPAIR  03/01/14   Lap incisional hernia repair w/mesh  . INCISIONAL HERNIA REPAIR N/A 03/01/2014   Procedure: LAPAROSCOPIC INCISIONAL HERNIA;  Surgeon: Ralene Ok, MD;  [facility];  Service: General;  Laterality: N/A;  . INSERTION OF MESH N/A 03/01/2014   Procedure: INSERTION OF MESH;  Surgeon: Ralene Ok, MD;  [facility]: [facility];  Service: General;  Laterality: N/A;  . LAPAROTOMY N/A 01/10/2013   Procedure: EXPLORATORY LAPAROTOMY;  Surgeon: Madilyn Hook, DO;  Location: [facility];  Service: General;  Laterality: N/A;  . LYSIS OF ADHESION N/A 01/10/2013   Procedure: LYSIS OF ADHESION;  Surgeon: Madilyn Hook, DO;  Location: [facility];  Service: General;  Laterality: N/A;  . STERIOD INJECTION Left 09/13/2014   Procedure: STEROID INJECTION LEFT HAND;  Surgeon: Johnny Bridge, MD;  Location: [facility];  Service: Orthopedics;  Laterality: Left;  Vitals:   03/01/17 1024  BP: 139/70  Pulse: 62        Subjective Assessment - 03/01/17 1028    Subjective 8/10 now .  Tried the exercises and they went well.   Currently in Pain? Yes   Pain Score 7    Pain Location Back   Pain Orientation Lower;Right   Pain Descriptors / Indicators Aching  pulling   Pain Type Chronic pain   Pain Radiating Towards into hips   Pain Frequency Constant   Aggravating Factors  Silver sneakers,  cold weather,  getting out of chair   Pain Relieving Factors meds,  laying, ice pack   Effect of Pain on Daily Activities limits ADLs stops when she can not do any more.                           [place] [facility] Adult PT Treatment/Exercise - 03/01/17 0001      Self-Care   Self-Care Other Self-Care Comments   Other Self-Care Comments  How to scoot to edge of chair and swing hips off to the side to decrease pain with sit to stand.  Stop activities that increase pain,  ALD handout briefly reviewed sitting posture, avoid forward trunk with ADL's     Lumbar Exercises: Stretches   Single Knee to Chest Stretch 3 reps;30 seconds   Single Knee to Chest Stretch Limitations both,    Lower Trunk Rotation 3 reps;20 seconds   Lower Trunk Rotation Limitations I like these.  It hurts but feel like they are doing some good.    Pelvic Tilt 5 reps     Lumbar Exercises: Sidelying   Other Sidelying Lumbar Exercises Quadratus lumborum stretch  sidelying,  also to be done standing,  HEP     Modalities   Modalities Cryotherapy     Cryotherapy   Number Minutes Cryotherapy 10 Minutes   Cryotherapy Location Lumbar Spine  right  and gluteal in sidelying.   Type of Cryotherapy --  cold pack,  pillow between knees.     Manual Therapy   Manual Therapy Soft tissue mobilization   Soft tissue mobilization right gluteal, QL and paraspinals.  Light  instrument assist trial and hand  used.  It did not help ease pain                PT Education - 03/01/17 1053    Education provided Yes   Education Details ADL and HEP   Person(s) Educated Patient   Methods Explanation;Demonstration;Verbal cues;Handout   Comprehension Verbalized understanding;Returned demonstration;Need further instruction          PT Short Term Goals - 03/01/17 1038      PT SHORT TERM GOAL #1   Title She will be independent with inital HEP   Baseline independent with exercises issued so far   Time 2   Status On-going     PT SHORT TERM GOAL #2   Title she will report pain decreased 25% or more   Baseline No change in pain noted yt   Time 3   Period Weeks   Status On-going     PT SHORT  TERM GOAL #3   Title she will improve lumbar flexion to 50 degrees to demo increased flexibility   Time 3   Period Weeks   Status Unable to assess           PT Long Term Goals - 02/22/17 1057  PT LONG TERM GOAL #1   Title She will be independent with all hEP issued   Time 6   Period Weeks   Status New     PT LONG TERM GOAL #2   Title She will report pain decreased 50% or rmoe with walking > 1 block   Time 6   Period Weeks   Status New     PT LONG TERM GOAL #3   Title She will be able to stand for 30 min or more with pain 2-3 max    Time 6   Period Weeks   Status New     PT LONG TERM GOAL #4   Title She will report pain as intermittant with activity during the day   Time 6   Period Weeks   Status New               Plan - 03/01/17 1109    Clinical Impression Statement Ms Pedregon arrived not feeling good.  She got nervous driving here.  BP was OK.  Focus today on education and exercise.  Ice decreased pain .  Pain after session 8/10 with Ice: "I feel pretty good"Independent with exercises issued so far.  No new goals met.   PT Next Visit Plan Progress ROM and core strength exercises   PT Home Exercise Plan PPT, LTR, knee to chest,  QL stretches   Consulted and Agree with Plan of Care Patient      Patient will benefit from skilled therapeutic intervention in order to improve the following deficits and impairments:  Decreased range of motion, Pain, Decreased activity tolerance, Postural dysfunction, Decreased strength, Increased muscle spasms  Visit Diagnosis: Radiculopathy, lumbar region  Muscle weakness (generalized)  Joint stiffness of spine  Stiffness of hip joint, unspecified laterality  Abnormal posture     Problem List Patient Active Problem List   Diagnosis Date Noted  . Vaginal dryness 01/12/2017  . Arthritis of sacroiliac joint ([place]) 12/06/2016  . Posterior pain of hip, left 11/25/2016  . Ischial pain 06/21/2016  . Ischial bursitis  06/09/2016  . Vaginal discharge 05/05/2016  . Hepatic steatosis 05/03/2016  . History of Helicobacter pylori infection 05/03/2016  . Diarrhea 02/10/2016  . Nausea 02/10/2016  . Diastolic CHF ([place]) 07/68/0881  . At high risk for falls 12/25/2015  . Secondary hyperparathyroidism of renal origin ([place]) 08/08/2015  . CKD (chronic kidney disease) stage 3, GFR 30-59 ml/min 08/05/2015  . Preventative health care 06/18/2015  . Lateral epicondylitis of right elbow 04/17/2015  . Degenerative lumbar disc 12/30/2014  . Cough   . Degenerative joint disease involving multiple joints on both sides of body (right foot pain) 08/14/2014  . Chronic low back pain 04/15/2014  . Fibromyalgia syndrome 06/18/2013  . Abdominal pain, epigastric 03/29/2013  . Myalgia 12/21/2012  . Tension headache, chronic 05/24/2012  . Depression 07/14/2011  . Anxiety 06/02/2011  . Lumbar spondylosis with myelopathy 11/20/2010  . Coronary atherosclerosis 03/10/2010  . Dyslipidemia 12/11/2008  . Vitamin D deficiency 11/26/2008  . Controlled type 2 diabetes mellitus with insulin therapy ([place]) 05/25/2007  . PERIPHERAL NEUROPATHY 05/25/2007  . Essential hypertension 05/25/2007  . GERD 05/25/2007  . SYMPTOM, APNEA, SLEEP NOS 05/25/2007    HARRIS,KAREN PTA 03/01/2017, 11:16 AM  [facility] [address] [place], [place], [address] Phone: [phone]   Fax:  [phone]  Name: [name] MRN: [id number] Date of Birth: 11/28/1950

## 2017-03-02 ENCOUNTER — Encounter (HOSPITAL_COMMUNITY): Payer: Self-pay | Admitting: Psychiatry

## 2017-03-02 ENCOUNTER — Ambulatory Visit (INDEPENDENT_AMBULATORY_CARE_PROVIDER_SITE_OTHER): Payer: Medicare Other | Admitting: Psychiatry

## 2017-03-02 VITALS — BP 132/78 | HR 73 | Ht 64.2 in | Wt 186.6 lb

## 2017-03-02 DIAGNOSIS — Z79899 Other long term (current) drug therapy: Secondary | ICD-10-CM

## 2017-03-02 DIAGNOSIS — Z87891 Personal history of nicotine dependence: Secondary | ICD-10-CM

## 2017-03-02 DIAGNOSIS — F3342 Major depressive disorder, recurrent, in full remission: Secondary | ICD-10-CM | POA: Diagnosis not present

## 2017-03-02 DIAGNOSIS — Z818 Family history of other mental and behavioral disorders: Secondary | ICD-10-CM

## 2017-03-02 DIAGNOSIS — Z794 Long term (current) use of insulin: Secondary | ICD-10-CM | POA: Diagnosis not present

## 2017-03-02 DIAGNOSIS — M5489 Other dorsalgia: Secondary | ICD-10-CM

## 2017-03-02 MED ORDER — SERTRALINE HCL 100 MG PO TABS: ORAL_TABLET | ORAL | 2 refills | Status: DC

## 2017-03-02 MED ORDER — BUPROPION HCL ER (XL) 150 MG PO TB24: 450.0000 mg | ORAL_TABLET | Freq: Every day | ORAL | 1 refills | Status: DC

## 2017-03-02 NOTE — Progress Notes (Signed)
Patient ID: [name], female   DOB: 04/24/1951, 66 y.o.   MRN: [id number] [facility]  03/02/2017 2:13 PM [name] Maeslice Bertelson  MRN:  [id number] Subjective:   Better  Happy Principal Problem: major depression , recurrent , mild Diagnosis:   Major depression, recurrent, mild   Today the patient is doing fairly well. She's having some neck and back spasms. Her biggest new problem seems to be data chronic back pain that seems to be somewhat worse. She has a new pain doctor. She's taking a number of medications for pain including gabapentin, vitamin D and supplements. She also gets injections. Her other significant stress is her husband who is alcohol dependent. Apparently he is drinking somewhat less. He is bothered that is been gaining weight. The patient has taken a position to let things go. At this time the patient is sleeping and eating well. She has good concentration. The patient has withdrawn a little bit from different entities. She doesn't go to church very much at all. She does have a number of close friends that and family who are supportive. The patient likes her home. She feels safe and secure. Patient's major problems again or physical problems including urological problems and some GYN issues. Her mood is actually stable. She demonstrates no evidence of psychosis. She herself drinks no alcohol uses no drugs. She denies chest pain or shortness of breath. She is followed closely by her primary care doctor. She takes the medicines I prescribed on a regular basis and does well. She is functioning well.   Past Psychiatric History:   Past Medical History:  Past Medical History:  Diagnosis Date  . Anemia   . Arthritis   . Borderline personality disorder   . CAD (coronary artery disease)    Catherization 11/18/09>nonobstructive CAD , normal LV function, EF 65%  . Cervicalgia   . Depression   . Diabetes mellitus   . Diabetes mellitus, type II (HCC)   . Diabetic gastroparesis (HCC)   .  Diastolic CHF (HCC)    Grade I, on Echo 06/2013, EF 55-60%  . GERD (gastroesophageal reflux disease)   . Gout   . Headache(784.0)   . Health maintenance examination    Colonoscopy 2009> normal; Diabetic eye exam 12/2008. No diabetic retinopathy; Mammogram 11/10> No evidence of malignancy, DXA 03/14/13 : normal  . Hyperlipidemia   . Hypertension   . Iliotibial band syndrome   . Low back pain syndrome   . Nonorganic psychosis   . Peripheral neuropathy   . Right carpal tunnel syndrome 09/13/2014  . SBO (small bowel obstruction) (HCC) 01/09/2013  . Sleep apnea    does not wear CPAP   . Vitamin B12 deficiency   . Vitamin D deficiency   . Wears dentures    upper  . Wears glasses     Past Surgical History:  Procedure Laterality Date  . ABDOMINAL HYSTERECTOMY    . APPENDECTOMY    . BOWEL RESECTION N/A 01/10/2013   Procedure: SMALL BOWEL RESECTION;  Surgeon: Lodema PilotBrian Layton, DO;  [facility];  Service: General;  Laterality: N/A;  . CARPAL TUNNEL RELEASE Right 09/13/2014   Procedure: RIGHT WRIST CARPAL TUNNEL RELEASE;  Surgeon: Eulas PostJoshua P Landau, MD;  Location: [facility];  Service: Orthopedics;  Laterality: Right;  . CARPAL TUNNEL RELEASE Left 02/28/2015   Procedure: LEFT CARPAL TUNNEL RELEASE;  Surgeon: Teryl LucyJoshua Landau, MD;  Location: [facility];  Service: Orthopedics;  Laterality: Left;  . HAND SURGERY  1992   rt  . HERNIA REPAIR  03/01/14   Lap incisional hernia repair w/mesh  . INCISIONAL HERNIA REPAIR N/A 03/01/2014   Procedure: LAPAROSCOPIC INCISIONAL HERNIA;  Surgeon: Axel Filler, MD;  Location: [facility];  Service: General;  Laterality: N/A;  . INSERTION OF MESH N/A 03/01/2014   Procedure: INSERTION OF MESH;  Surgeon: Axel Filler, MD;  [facility];  Service: General;  Laterality: N/A;  . LAPAROTOMY N/A 01/10/2013   Procedure: EXPLORATORY LAPAROTOMY;  Surgeon: Lodema Pilot, DO;  [facility];  Service: General;  Laterality: N/A;  . LYSIS OF ADHESION  N/A 01/10/2013   Procedure: LYSIS OF ADHESION;  Surgeon: Lodema Pilot, DO;  [facility];  Service: General;  Laterality: N/A;  . STERIOD INJECTION Left 09/13/2014   Procedure: STEROID INJECTION LEFT HAND;  Surgeon: Eulas Post, MD;  Location: [facility];  Service: Orthopedics;  Laterality: Left;   Family History:  Family History  Problem Relation Age of Onset  . Diabetes Mother   . Hypertension Mother   . Hyperlipidemia Mother   . Arthritis Mother   . Depression Mother   . Heart disease Father   . Diabetes Brother   . Heart disease Brother   . Diabetes Brother   . Heart disease Paternal Grandmother    Family Psychiatric  History:  Social History:  History  Alcohol Use No     History  Drug Use No    Social History   Social History  . Marital status: Married    Spouse name: N/A  . Number of children: N/A  . Years of education: N/A   Social History Main Topics  . Smoking status: Former Smoker    Types: Cigarettes    Quit date: 10/25/2002  . Smokeless tobacco: Never Used  . Alcohol use No  . Drug use: No  . Sexual activity: Not Currently   Other Topics Concern  . None   Social History Narrative  . None   Additional Social History:                         Sleep: Good  Appetite:  Good  Current Medications: Current Outpatient Prescriptions  Medication Sig Dispense Refill  . ACCU-CHEK AVIVA PLUS test strip TEST UPTO 4 TIMES DAILY 125 each 5  . ACCU-CHEK FASTCLIX LANCETS MISC TEST five times a day 102 each 11  . acetaminophen (TYLENOL) 500 MG tablet Take 325 mg by mouth.    . AMITIZA 24 MCG capsule     . aspirin 81 MG chewable tablet Chew 81 mg by mouth daily.    Marland Kitchen buPROPion (WELLBUTRIN XL) 150 MG 24 hr tablet Take 3 tablets (450 mg total) by mouth daily. 270 tablet 1  . cloNIDine (CATAPRES - DOSED IN MG/24 HR) 0.3 mg/24hr patch Place 1 patch (0.3 mg total) onto the skin once a week. 12 patch 2  . dexlansoprazole (DEXILANT) 60 MG  capsule Take 1 capsule (60 mg total) by mouth daily. For reflux 30 capsule   . diclofenac sodium (VOLTAREN) 1 % GEL Apply 2 g topically 4 (four) times daily. 3 Tube 1  . estrogens, conjugated, (PREMARIN) 0.3 MG tablet Take 1 tablet (0.3 mg total) by mouth daily. Take daily for 21 days then do not take for 7 days. 30 tablet 3  . furosemide (LASIX) 40 MG tablet Take 1 tablet (40 mg total) by mouth daily. 30 tablet 1  . gabapentin (NEURONTIN)  100 MG capsule Take 1 capsule (100 mg total) by mouth 2 (two) times daily. 60 capsule 2  . gabapentin (NEURONTIN) 300 MG capsule Take 300 mg by mouth 3 (three) times daily.    . Insulin Glargine (LANTUS SOLOSTAR) 100 UNIT/ML Solostar Pen Inject 45 Units into the skin daily.    . Insulin Pen Needle (BD PEN NEEDLE NANO U/F) 32G X 4 MM MISC Use to inject insulin into the skin 100 each 6  . JANUVIA 50 MG tablet take 1 tablet by mouth once daily 90 tablet 4  . LINZESS 145 MCG CAPS capsule Take 145 mcg by mouth daily.  1  . metoprolol succinate (TOPROL-XL) 100 MG 24 hr tablet Take 1 tablet (100 mg total) by mouth every morning. 30 tablet 1  . promethazine (PHENERGAN) 25 MG tablet Take 25 mg by mouth.    . rosuvastatin (CRESTOR) 10 MG tablet Take 1 tablet (10 mg total) by mouth daily. 30 tablet 11  . sertraline (ZOLOFT) 100 MG tablet 2 qam 180 tablet 2  . Vitamin D, Ergocalciferol, (DRISDOL) 50000 units CAPS capsule take 1 capsule by mouth every week 12 capsule 0   No current facility-administered medications for this visit.     Lab Results: No results found. However, due to the size of the patient record, not all encounters were searched. Please check Results Review for a complete set of results.  Physical Findings: AIMS:  , ,  ,  ,    CIWA:    COWS:     Musculoskeletal: Strength & Muscle Tone: within normal limits Gait & Station: normal Patient leans: N/A  Psychiatric Specialty Exam: ROS  Blood pressure 132/78, pulse 73, height 5' 4.2" (1.631 m), weight  186 lb 9.6 oz (84.6 kg).Body mass index is 31.83 kg/m.  General Appearance: Casual  Eye Contact::  Good  Speech:  Clear and Coherent  Volume:  Normal  Mood:  Euthymic  Affect:  Congruent  Thought Process:  Coherent  Orientation:  Full (Time, Place, and Person)  Thought Content:  WDL  Suicidal Thoughts:  No  Homicidal Thoughts:  No  Memory:  NA  Judgement:  Good  Insight:  Good  Psychomotor Activity:  Normal  Concentration:  Good  Recall:  Good  Fund of Knowledge:Good  Language: Good  Akathisia:  No  Handed:  Right  AIMS (if indicated):     Assets:  Desire for Improvement  ADL's:  Intact  Cognition: WNL  Sleep:      Treatment Plan Summary: 03/02/2017, 2:13 PM   Today the patient is doing well. Her mood is stable. She denies any neurological symptoms or any physical complaints other than chronic back pain the patient takes 200 mg of Zoloft 450 mg Wellbutrin and does well. At this time the patient is not in therapy. Her #1 problem therefore is major clinical depression is well-controlled with Zoloft and Wellbutrin. Her other problem is her alcohol dependent husband who seems to be drinking just a little bit less. Her last problem is chronic pain and she presently is actively involved in care for this condition. This patient to return to see me in 5 months.Marland Kitchen

## 2017-03-03 ENCOUNTER — Ambulatory Visit: Payer: Medicare Other

## 2017-03-08 ENCOUNTER — Ambulatory Visit (INDEPENDENT_AMBULATORY_CARE_PROVIDER_SITE_OTHER): Payer: Medicare Other | Admitting: Clinical

## 2017-03-08 ENCOUNTER — Ambulatory Visit: Payer: Medicare Other | Admitting: Physical Therapy

## 2017-03-08 ENCOUNTER — Encounter (HOSPITAL_COMMUNITY): Payer: Self-pay | Admitting: Clinical

## 2017-03-08 ENCOUNTER — Encounter: Payer: Self-pay | Admitting: Physical Therapy

## 2017-03-08 DIAGNOSIS — R2681 Unsteadiness on feet: Secondary | ICD-10-CM | POA: Diagnosis not present

## 2017-03-08 DIAGNOSIS — M5416 Radiculopathy, lumbar region: Secondary | ICD-10-CM

## 2017-03-08 DIAGNOSIS — R293 Abnormal posture: Secondary | ICD-10-CM

## 2017-03-08 DIAGNOSIS — M2560 Stiffness of unspecified joint, not elsewhere classified: Secondary | ICD-10-CM

## 2017-03-08 DIAGNOSIS — M25659 Stiffness of unspecified hip, not elsewhere classified: Secondary | ICD-10-CM | POA: Diagnosis not present

## 2017-03-08 DIAGNOSIS — M6281 Muscle weakness (generalized): Secondary | ICD-10-CM

## 2017-03-08 DIAGNOSIS — F411 Generalized anxiety disorder: Secondary | ICD-10-CM | POA: Diagnosis not present

## 2017-03-08 DIAGNOSIS — F331 Major depressive disorder, recurrent, moderate: Secondary | ICD-10-CM

## 2017-03-08 NOTE — Progress Notes (Signed)
   THERAPIST PROGRESS NOTE  Session Time: 10:00 - 10:45   Participation Level: Active  Behavioral Response: NeatAlertNA  Type of Therapy: Individual Therapy  Treatment Goals addressed: Improve Psychiatric Symptoms,Emotional Regulation Skills, Interpersonal Relationship Skills  Interventions: Motivational Interviewing, CBT,   Summary: [name] is a 66 y.o. female who presents with Major Depressive Disorder, Generalized Anxiety Disorder   Suicidal/Homicidal: No -without intent/plan  Therapist Response:  Kami met with clinician for an individual session. [name] shared about her psychiatric symptoms, her current life events and her homework. Ashia shared that she has been doing well for the most part. Clinician asked open ended questions and [name] shared that she has had some difficult days but that she has been using the skills she learned in therapy to "keep my head up and moving forward." Clinician asked open ended questions and [name] shared that she has been working to enjoy life. She shared that she recognizes that she can't wait for others to behave the way she would like. Client and clinician discussed her thoughts and insights. Clinician asked open ended questions and [name] shared about some of her challenging relationships. Clinician asked pen ended questions about her goals for the relationships. [name] was then able to identify things she was doing that were making her feel good and areas that she was wanting to improve. She was then able to identify ways she could improve her thoughts and behaviors. Client and clinician discussed how her emotions might improve if she did so.     Plan: Return again in 3-4 weeks.  Diagnosis:     Axis I: Major Depressive Disorder, Generalized Anxiety Disorder   Kemontae Dunklee A, LCSW 03/08/2017

## 2017-03-08 NOTE — Patient Instructions (Signed)
Call Md if you have any chest pain Or call 911.

## 2017-03-08 NOTE — Therapy (Signed)
[facility] [address] [place], [place], [address] Phone: [phone]   Fax:  [phone]  Physical Therapy Treatment  Patient Details  Name: [name] MRN: [id number] Date of Birth: Mar 14, 1951 Referring Provider: Antoine Primas DO  Encounter Date: 03/08/2017      PT End of Session - 03/08/17 1709    Visit Number 3   Number of Visits 12   Date for PT Re-Evaluation 04/01/17   PT Start Time 1503   PT Stop Time 1600   PT Time Calculation (min) 57 min   Activity Tolerance Patient tolerated treatment well   Behavior During Therapy [facility] for tasks assessed/performed      Past Medical History:  Diagnosis Date  . Anemia   . Arthritis   . Borderline personality disorder   . CAD (coronary artery disease)    Catherization 11/18/09>nonobstructive CAD , normal LV function, EF 65%  . Cervicalgia   . Depression   . Diabetes mellitus   . Diabetes mellitus, type II (HCC)   . Diabetic gastroparesis (HCC)   . Diastolic CHF (HCC)    Grade I, on Echo 06/2013, EF 55-60%  . GERD (gastroesophageal reflux disease)   . Gout   . Headache(784.0)   . Health maintenance examination    Colonoscopy 2009> normal; Diabetic eye exam 12/2008. No diabetic retinopathy; Mammogram 11/10> No evidence of malignancy, DXA 03/14/13 : normal  . Hyperlipidemia   . Hypertension   . Iliotibial band syndrome   . Low back pain syndrome   . Nonorganic psychosis   . Peripheral neuropathy   . Right carpal tunnel syndrome 09/13/2014  . SBO (small bowel obstruction) (HCC) 01/09/2013  . Sleep apnea    does not wear CPAP   . Vitamin B12 deficiency   . Vitamin D deficiency   . Wears dentures    upper  . Wears glasses     Past Surgical History:  Procedure Laterality Date  . ABDOMINAL HYSTERECTOMY    . APPENDECTOMY    . BOWEL RESECTION N/A 01/10/2013   Procedure: SMALL BOWEL RESECTION;  Surgeon: Lodema Pilot, DO;  [facility];  Service: General;  Laterality:  N/A;  . CARPAL TUNNEL RELEASE Right 09/13/2014   Procedure: RIGHT WRIST CARPAL TUNNEL RELEASE;  Surgeon: Eulas Post, MD;  Location: [facility];  Service: Orthopedics;  Laterality: Right;  . CARPAL TUNNEL RELEASE Left 02/28/2015   Procedure: LEFT CARPAL TUNNEL RELEASE;  Surgeon: Teryl Lucy, MD;  Location: [facility];  Service: Orthopedics;  Laterality: Left;  . HAND SURGERY  1992   rt  . HERNIA REPAIR  03/01/14   Lap incisional hernia repair w/mesh  . INCISIONAL HERNIA REPAIR N/A 03/01/2014   Procedure: LAPAROSCOPIC INCISIONAL HERNIA;  Surgeon: Axel Filler, MD;  [facility];  Service: General;  Laterality: N/A;  . INSERTION OF MESH N/A 03/01/2014   Procedure: INSERTION OF MESH;  Surgeon: Axel Filler, MD;  [facility]: [facility];  Service: General;  Laterality: N/A;  . LAPAROTOMY N/A 01/10/2013   Procedure: EXPLORATORY LAPAROTOMY;  Surgeon: Lodema Pilot, DO;  [facility];  Service: General;  Laterality: N/A;  . LYSIS OF ADHESION N/A 01/10/2013   Procedure: LYSIS OF ADHESION;  Surgeon: Lodema Pilot, DO;  [facility];  Service: General;  Laterality: N/A;  . STERIOD INJECTION Left 09/13/2014   Procedure: STEROID INJECTION LEFT HAND;  Surgeon: Eulas Post, MD;  Location:  [facility];  Service: Orthopedics;  Laterality: Left;  There were no vitals filed for this visit.      Subjective Assessment - 03/08/17 1507    Subjective 8/10 i am exhausted from doing laundry yesterday. I have been busy.  I have not done a lot of exercise. I want to do some stretches.  Had chest pain yesterday lasting 5 minutes and it went away.    Currently in Pain? No/denies   Pain Score 8    Pain Location Back   Pain Orientation Lower;Right;Left   Pain Descriptors / Indicators Aching  right and left temples   Pain Type Chronic pain   Pain Radiating Towards into hips and right groin   Pain Frequency Constant   Aggravating Factors  driving,  silver  sneakers,  cold weather,  getting out of the chair.    Pain Relieving Factors laying down,  ice pack,  Meds.    Multiple Pain Sites No  mid back, wakes with it.                           [facility] Adult PT Treatment/Exercise - 03/08/17 0001      Lumbar Exercises: Stretches   Passive Hamstring Stretch 3 reps   Passive Hamstring Stretch Limitations pain with foot into DF, (neural tension?)  tolerated with knee flexed.   both   Single Knee to Chest Stretch 3 reps;20 seconds   Single Knee to Chest Stretch Limitations both   Lower Trunk Rotation 5 reps;20 seconds   Lower Trunk Rotation Limitations Also bookopener , right, heavy cues 5 X   Pelvic Tilt 5 reps     Lumbar Exercises: Supine   Clam 5 reps  single each and double,   cues   Clam Limitations ball squeeze, cued breathing.   Bridge 5 reps;5 seconds     Lumbar Exercises: Sidelying   Other Sidelying Lumbar Exercises Quadratus lumborum stretch  sidelying,    increased pain with manual over pressure     Lumbar Exercises: Prone   Single Arm Raise 5 reps   Other Prone Lumbar Exercises multifitus press 5 X 2 sets  decreased activiation noted right     Cryotherapy   Number Minutes Cryotherapy 10 Minutes   Cryotherapy Location Lumbar Spine   Type of Cryotherapy --  cold pack     Manual Therapy   Soft tissue mobilization right QL strumming and stretch,                    PT Short Term Goals - 03/08/17 1713      PT SHORT TERM GOAL #1   Title She will be independent with inital HEP   Baseline independent with exercises issued so far   Time 2   Period Weeks   Status On-going     PT SHORT TERM GOAL #2   Title she will report pain decreased 25% or more   Baseline No lasting  change in pain noted yet,  stretches help   Time 3   Period Weeks   Status On-going     PT SHORT TERM GOAL #3   Title she will improve lumbar flexion to 50 degrees to demo increased flexibility   Time 3   Period Weeks    Status Unable to assess           PT Long Term Goals - 02/22/17 1057      PT LONG TERM GOAL #1   Title She will be independent with  all hEP issued   Time 6   Period Weeks   Status New     PT LONG TERM GOAL #2   Title She will report pain decreased 50% or rmoe with walking > 1 block   Time 6   Period Weeks   Status New     PT LONG TERM GOAL #3   Title She will be able to stand for 30 min or more with pain 2-3 max    Time 6   Period Weeks   Status New     PT LONG TERM GOAL #4   Title She will report pain as intermittant with activity during the day   Time 6   Period Weeks   Status New               Plan - 03/08/17 1709    Clinical Impression Statement [name] has decreased pain with lower trunk rotation.  Her pain increased during some of todays exercises.  There may be neural tension pain , noted with passive Hamstring stretchs and DF.  She had chest pain yesterday .  No chest pain today.  I told her to call MD or 911 if she gets it again.   Progress toward HEP goals.   hips were level today.   PT Next Visit Plan Progress ROM and core strength exercises, she likes LTR   PT Home Exercise Plan PPT, LTR, knee to chest,  QL stretches   Consulted and Agree with Plan of Care Patient      Patient will benefit from skilled therapeutic intervention in order to improve the following deficits and impairments:  Decreased range of motion, Pain, Decreased activity tolerance, Postural dysfunction, Decreased strength, Increased muscle spasms  Visit Diagnosis: Radiculopathy, lumbar region  Muscle weakness (generalized)  Joint stiffness of spine  Stiffness of hip joint, unspecified laterality  Abnormal posture  Unsteadiness on feet     Problem List Patient Active Problem List   Diagnosis Date Noted  . Vaginal dryness 01/12/2017  . Arthritis of sacroiliac joint (HCC) 12/06/2016  . Posterior pain of hip, left 11/25/2016  . Ischial pain 06/21/2016  . Ischial  bursitis 06/09/2016  . Vaginal discharge 05/05/2016  . Hepatic steatosis 05/03/2016  . History of Helicobacter pylori infection 05/03/2016  . Diarrhea 02/10/2016  . Nausea 02/10/2016  . Diastolic CHF (HCC) 01/26/2016  . At high risk for falls 12/25/2015  . Secondary hyperparathyroidism of renal origin (HCC) 08/08/2015  . CKD (chronic kidney disease) stage 3, GFR 30-59 ml/min 08/05/2015  . Preventative health care 06/18/2015  . Lateral epicondylitis of right elbow 04/17/2015  . Degenerative lumbar disc 12/30/2014  . Cough   . Degenerative joint disease involving multiple joints on both sides of body (right foot pain) 08/14/2014  . Chronic low back pain 04/15/2014  . Fibromyalgia syndrome 06/18/2013  . Abdominal pain, epigastric 03/29/2013  . Myalgia 12/21/2012  . Tension headache, chronic 05/24/2012  . Depression 07/14/2011  . Anxiety 06/02/2011  . Lumbar spondylosis with myelopathy 11/20/2010  . Coronary atherosclerosis 03/10/2010  . Dyslipidemia 12/11/2008  . Vitamin D deficiency 11/26/2008  . Controlled type 2 diabetes mellitus with insulin therapy (HCC) 05/25/2007  . PERIPHERAL NEUROPATHY 05/25/2007  . Essential hypertension 05/25/2007  . GERD 05/25/2007  . SYMPTOM, APNEA, SLEEP NOS 05/25/2007    HARRIS,KAREN PTA 03/08/2017, 5:15 PM  [facility] [address] [place], [place], [address] Phone: [phone]   Fax:  [phone]  Name: Dyke Maeslice Bartley  MRN: [id number] Date of Birth: 01-12-1951

## 2017-03-10 ENCOUNTER — Encounter: Payer: Self-pay | Admitting: Physical Therapy

## 2017-03-10 ENCOUNTER — Ambulatory Visit: Payer: Medicare Other | Admitting: Physical Therapy

## 2017-03-10 DIAGNOSIS — R293 Abnormal posture: Secondary | ICD-10-CM | POA: Diagnosis not present

## 2017-03-10 DIAGNOSIS — M6281 Muscle weakness (generalized): Secondary | ICD-10-CM | POA: Diagnosis not present

## 2017-03-10 DIAGNOSIS — M5416 Radiculopathy, lumbar region: Secondary | ICD-10-CM

## 2017-03-10 DIAGNOSIS — M25659 Stiffness of unspecified hip, not elsewhere classified: Secondary | ICD-10-CM

## 2017-03-10 DIAGNOSIS — R2681 Unsteadiness on feet: Secondary | ICD-10-CM | POA: Diagnosis not present

## 2017-03-10 DIAGNOSIS — M2560 Stiffness of unspecified joint, not elsewhere classified: Secondary | ICD-10-CM | POA: Diagnosis not present

## 2017-03-10 NOTE — Patient Instructions (Signed)
Issued from HEP  Standing band Diagonals, ER, and horizont pulld.  Daily 10 x each 1 second hold. Pain free Syand with back to wall.

## 2017-03-10 NOTE — Therapy (Signed)
[facility] [address] [place], [place], [address] Phone: [phone]   Fax:  [phone]  Physical Therapy Treatment  Patient Details  Name: [name] MRN: [id number] Date of Birth: 05-30-1951 Referring Provider: Antoine Primas DO  Encounter Date: 03/10/2017      PT End of Session - 03/10/17 1054    Visit Number 4   Number of Visits 12   Date for PT Re-Evaluation 04/01/17   PT Start Time 1010   PT Stop Time 1110   PT Time Calculation (min) 60 min   Activity Tolerance Patient tolerated treatment well   Behavior During Therapy [facility] for tasks assessed/performed      Past Medical History:  Diagnosis Date  . Anemia   . Arthritis   . Borderline personality disorder   . CAD (coronary artery disease)    Catherization 11/18/09>nonobstructive CAD , normal LV function, EF 65%  . Cervicalgia   . Depression   . Diabetes mellitus   . Diabetes mellitus, type II (HCC)   . Diabetic gastroparesis (HCC)   . Diastolic CHF (HCC)    Grade I, on Echo 06/2013, EF 55-60%  . GERD (gastroesophageal reflux disease)   . Gout   . Headache(784.0)   . Health maintenance examination    Colonoscopy 2009> normal; Diabetic eye exam 12/2008. No diabetic retinopathy; Mammogram 11/10> No evidence of malignancy, DXA 03/14/13 : normal  . Hyperlipidemia   . Hypertension   . Iliotibial band syndrome   . Low back pain syndrome   . Nonorganic psychosis   . Peripheral neuropathy   . Right carpal tunnel syndrome 09/13/2014  . SBO (small bowel obstruction) (HCC) 01/09/2013  . Sleep apnea    does not wear CPAP   . Vitamin B12 deficiency   . Vitamin D deficiency   . Wears dentures    upper  . Wears glasses     Past Surgical History:  Procedure Laterality Date  . ABDOMINAL HYSTERECTOMY    . APPENDECTOMY    . BOWEL RESECTION N/A 01/10/2013   Procedure: SMALL BOWEL RESECTION;  Surgeon: Lodema Pilot, DO;  Location: [facility];  Service: General;  Laterality:  N/A;  . CARPAL TUNNEL RELEASE Right 09/13/2014   Procedure: RIGHT WRIST CARPAL TUNNEL RELEASE;  Surgeon: Eulas Post, MD;  Location: [facility];  Service: Orthopedics;  Laterality: Right;  . CARPAL TUNNEL RELEASE Left 02/28/2015   Procedure: LEFT CARPAL TUNNEL RELEASE;  Surgeon: Teryl Lucy, MD;  Location: [facility];  Service: Orthopedics;  Laterality: Left;  . HAND SURGERY  1992   rt  . HERNIA REPAIR  03/01/14   Lap incisional hernia repair w/mesh  . INCISIONAL HERNIA REPAIR N/A 03/01/2014   Procedure: LAPAROSCOPIC INCISIONAL HERNIA;  Surgeon: Axel Filler, MD;  Location: [facility];  Service: General;  Laterality: N/A;  . INSERTION OF MESH N/A 03/01/2014   Procedure: INSERTION OF MESH;  Surgeon: Axel Filler, MD;  Location: [facility];  Service: General;  Laterality: N/A;  . LAPAROTOMY N/A 01/10/2013   Procedure: EXPLORATORY LAPAROTOMY;  Surgeon: Lodema Pilot, DO;  Location: [facility];  Service: General;  Laterality: N/A;  . LYSIS OF ADHESION N/A 01/10/2013   Procedure: LYSIS OF ADHESION;  Surgeon: Lodema Pilot, DO;  Location: [facility];  Service: General;  Laterality: N/A;  . STERIOD INJECTION Left 09/13/2014   Procedure: STEROID INJECTION LEFT HAND;  Surgeon: Eulas Post, MD;  Location: [facility];  Service: Orthopedics;  Laterality: Left;  There were no vitals filed for this visit.      Subjective Assessment - 03/10/17 1009    Subjective 6/10 mid back now. I feel good today.  It hurts a little bit. Yesterday it hurt like the dickens,  everything hurt.  In feeling good today.  I have been sitting a lot.     Currently in Pain? Yes   Pain Score 6    Pain Location Back   Pain Orientation Mid                         [facility] Adult PT Treatment/Exercise - 03/10/17 0001      Lumbar Exercises: Stretches   Single Knee to Chest Stretch 5 reps;20 seconds   Single Knee to Chest Stretch Limitations both   Lower Trunk Rotation 5 reps;10  seconds  2 sets   Pelvic Tilt 5 reps   Standing Extension 5 reps     Lumbar Exercises: Aerobic   Stationary Bike Nu step arms and legs 5 minutes level 5.  324 steps     Lumbar Exercises: Standing   Functional Squats Limitations mini squats with scap squeeze 10 x, cues initially   Other Standing Lumbar Exercises back to wall ARON flexion/ extension,  With yellow band diagonals, horizontal pulls and ER small pad for back of head .  HEP      Lumbar Exercises: Supine   Ab Set 10 reps   AB Set Limitations With ball squeeze   Bridge 10 reps     Modalities   Modalities Moist Heat     Moist Heat Therapy   Number Minutes Moist Heat 15 Minutes   Moist Heat Location Lumbar Spine  prone over 2 pillows                PT Education - 03/10/17 1045    Education provided Yes   Education Details HEP   Person(s) Educated Patient   Methods Explanation;Demonstration;Verbal cues;Handout   Comprehension Verbalized understanding;Returned demonstration          PT Short Term Goals - 03/08/17 1713      PT SHORT TERM GOAL #1   Title She will be independent with inital HEP   Baseline independent with exercises issued so far   Time 2   Period Weeks   Status On-going     PT SHORT TERM GOAL #2   Title she will report pain decreased 25% or more   Baseline No lasting  change in pain noted yet,  stretches help   Time 3   Period Weeks   Status On-going     PT SHORT TERM GOAL #3   Title she will improve lumbar flexion to 50 degrees to demo increased flexibility   Time 3   Period Weeks   Status Unable to assess           PT Long Term Goals - 02/22/17 1057      PT LONG TERM GOAL #1   Title She will be independent with all hEP issued   Time 6   Period Weeks   Status New     PT LONG TERM GOAL #2   Title She will report pain decreased 50% or rmoe with walking > 1 block   Time 6   Period Weeks   Status New     PT LONG TERM GOAL #3   Title She will be able to stand for  30 min or more  with pain 2-3 max    Time 6   Period Weeks   Status New     PT LONG TERM GOAL #4   Title She will report pain as intermittant with activity during the day   Time 6   Period Weeks   Status New               Plan - 03/10/17 1055    Clinical Impression Statement Danielle Dess Increased with exercise to 8/10  low back.  She was able to progress to standing exercises with band at wall.  She is able to reach 6.5 inches from floor while touching counter.     PT Treatment/Interventions Electrical Stimulation;Moist Heat;Patient/family education;Passive range of motion;Manual techniques;Taping;Therapeutic activities;Dry needling;Therapeutic exercise   PT Next Visit Plan Progress ROM and core strength exercises, she likes LTR  review standing band exercises   PT Home Exercise Plan PPT, LTR, knee to chest,  QL stretches,  Standing band exercises,  ER/ Diagonals, horizontal pulls   Consulted and Agree with Plan of Care Patient      Patient will benefit from skilled therapeutic intervention in order to improve the following deficits and impairments:  Decreased range of motion, Pain, Decreased activity tolerance, Postural dysfunction, Decreased strength, Increased muscle spasms  Visit Diagnosis: Radiculopathy, lumbar region  Muscle weakness (generalized)  Joint stiffness of spine  Stiffness of hip joint, unspecified laterality  Abnormal posture     Problem List Patient Active Problem List   Diagnosis Date Noted  . Vaginal dryness 01/12/2017  . Arthritis of sacroiliac joint (HCC) 12/06/2016  . Posterior pain of hip, left 11/25/2016  . Ischial pain 06/21/2016  . Ischial bursitis 06/09/2016  . Vaginal discharge 05/05/2016  . Hepatic steatosis 05/03/2016  . History of Helicobacter pylori infection 05/03/2016  . Diarrhea 02/10/2016  . Nausea 02/10/2016  . Diastolic CHF (HCC) 01/26/2016  . At high risk for falls 12/25/2015  . Secondary hyperparathyroidism of renal origin  (HCC) 08/08/2015  . CKD (chronic kidney disease) stage 3, GFR 30-59 ml/min 08/05/2015  . Preventative health care 06/18/2015  . Lateral epicondylitis of right elbow 04/17/2015  . Degenerative lumbar disc 12/30/2014  . Cough   . Degenerative joint disease involving multiple joints on both sides of body (right foot pain) 08/14/2014  . Chronic low back pain 04/15/2014  . Fibromyalgia syndrome 06/18/2013  . Abdominal pain, epigastric 03/29/2013  . Myalgia 12/21/2012  . Tension headache, chronic 05/24/2012  . Depression 07/14/2011  . Anxiety 06/02/2011  . Lumbar spondylosis with myelopathy 11/20/2010  . Coronary atherosclerosis 03/10/2010  . Dyslipidemia 12/11/2008  . Vitamin D deficiency 11/26/2008  . Controlled type 2 diabetes mellitus with insulin therapy (HCC) 05/25/2007  . PERIPHERAL NEUROPATHY 05/25/2007  . Essential hypertension 05/25/2007  . GERD 05/25/2007  . SYMPTOM, APNEA, SLEEP NOS 05/25/2007    HARRIS,KAREN PTA 03/10/2017, 10:57 AM  [facility] [address] [place], [place], [address] Phone: [phone]   Fax:  [phone]  Name: [name] MRN: [id number] Date of Birth: [birth year]

## 2017-03-11 ENCOUNTER — Encounter: Payer: Self-pay | Admitting: Internal Medicine

## 2017-03-11 ENCOUNTER — Ambulatory Visit (INDEPENDENT_AMBULATORY_CARE_PROVIDER_SITE_OTHER): Payer: Medicare Other | Admitting: Internal Medicine

## 2017-03-11 VITALS — BP 138/54 | HR 67 | Temp 98.2°F | Ht 65.0 in | Wt 189.6 lb

## 2017-03-11 DIAGNOSIS — Z87891 Personal history of nicotine dependence: Secondary | ICD-10-CM | POA: Diagnosis not present

## 2017-03-11 DIAGNOSIS — R51 Headache: Secondary | ICD-10-CM

## 2017-03-11 DIAGNOSIS — I129 Hypertensive chronic kidney disease with stage 1 through stage 4 chronic kidney disease, or unspecified chronic kidney disease: Secondary | ICD-10-CM | POA: Diagnosis not present

## 2017-03-11 DIAGNOSIS — N183 Chronic kidney disease, stage 3 unspecified: Secondary | ICD-10-CM

## 2017-03-11 DIAGNOSIS — R519 Headache, unspecified: Secondary | ICD-10-CM

## 2017-03-11 DIAGNOSIS — I10 Essential (primary) hypertension: Secondary | ICD-10-CM

## 2017-03-11 DIAGNOSIS — Z79899 Other long term (current) drug therapy: Secondary | ICD-10-CM | POA: Diagnosis not present

## 2017-03-11 NOTE — Assessment & Plan Note (Signed)
Patient is having progressively worsening crt for last few lab checks. Will refer her to nephrology to get established. Check BMET today.

## 2017-03-11 NOTE — Patient Instructions (Signed)
Will refer you to neurology and nephrology.  Follow up with in 3 months.  Continue your current medications.

## 2017-03-11 NOTE — Assessment & Plan Note (Signed)
Vitals:   03/11/17 0945  BP: (!) 138/54  Pulse: 67  Temp: 98.2 F (36.8 C)   BP is well controlled today on current regimen of metoprolol XL 100mg  daily + lasix 40mg  daily + clonidine 0.3 mg weekly patch. Will recheck BMET today to trend crt and check K+.

## 2017-03-11 NOTE — Assessment & Plan Note (Signed)
Has chronic daily headache which is progressively worsening. See HPI for details. This does not sound like migraine or tension headache (the occipital headache could be tension related) but the frontal/temporal headache sounds like it could be paroxysmal hemicrania or other uncommong cluster headaches. Already on high dose gabapentin for her GFR and on 2 anti-depressants.  Will refer her to neurology for proper diagnosis of her headache and treatment recommendations.

## 2017-03-11 NOTE — Progress Notes (Signed)
CC: HTN f/up   HPI:  [name] is a 66 y.o. w/ PMh as listed below is here for HTN f/up  Past Medical History:  Diagnosis Date  . Anemia   . Arthritis   . Borderline personality disorder   . CAD (coronary artery disease)    Catherization 11/18/09>nonobstructive CAD , normal LV function, EF 65%  . Cervicalgia   . Depression   . Diabetes mellitus   . Diabetes mellitus, type II (HCC)   . Diabetic gastroparesis (HCC)   . Diastolic CHF (HCC)    Grade I, on Echo 06/2013, EF 55-60%  . GERD (gastroesophageal reflux disease)   . Gout   . Headache(784.0)   . Health maintenance examination    Colonoscopy 2009> normal; Diabetic eye exam 12/2008. No diabetic retinopathy; Mammogram 11/10> No evidence of malignancy, DXA 03/14/13 : normal  . Hyperlipidemia   . Hypertension   . Iliotibial band syndrome   . Low back pain syndrome   . Nonorganic psychosis   . Peripheral neuropathy   . Right carpal tunnel syndrome 09/13/2014  . SBO (small bowel obstruction) (HCC) 01/09/2013  . Sleep apnea    does not wear CPAP   . Vitamin B12 deficiency   . Vitamin D deficiency   . Wears dentures    upper  . Wears glasses    Last visit her HCTZ due to CKD IV along with Lisinopril due hyper K+, currently on metoprolol XL 100mg  daily + lasix 40mg  daily + clonidine 0.3 mg weekly patch.  Lab that visit showed K 6.7, but when it was repeated in the ED it was 5.0. crt 2.27 which is slowly increasing.   Headache - complaints of headache which is progressively worsening, has had the headache for 5 years, now becoming more frequent, has the headache daily about twice a day, lasts 3-4 mins and resolves sponteanously. She has separate headache on the occiptial areas which feels like tension related from her neck but she also has headache on the fronto temporal area which is sharp alternates between left and right, has severe 10/10 pain for few mins without any aura, n/v, or neuro changes and it resolves. No tearing  of the eyes.   Patient wants neurology referral. Already on high dose of gabapentin based on her GFR and also on SSRI and wellbutrin.     Review of Systems:   Review of Systems  Constitutional: Negative for chills and fever.  Respiratory: Negative for cough.   Cardiovascular: Negative for chest pain.  Gastrointestinal: Negative for heartburn, nausea and vomiting.  Neurological: Positive for headaches. Negative for dizziness, tingling, tremors, sensory change, speech change, focal weakness, seizures and loss of consciousness.     Physical Exam:  Vitals:   03/11/17 0945  BP: (!) 138/54  Pulse: 67  Temp: 98.2 F (36.8 C)  TempSrc: Oral  SpO2: 98%  Weight: 189 lb 9.6 oz (86 kg)  Height: 5\' 5"  (1.651 m)   Physical Exam  Constitutional: She is oriented to person, place, and time. She appears well-developed and well-nourished.  HENT:  Head: Normocephalic and atraumatic.  Mouth/Throat: No oropharyngeal exudate.  Eyes: Conjunctivae and EOM are normal. Pupils are equal, round, and reactive to light. Right eye exhibits no discharge. Left eye exhibits no discharge.  Respiratory: Effort normal and breath sounds normal. No respiratory distress. She has no wheezes.  GI: Soft. Bowel sounds are normal.  Musculoskeletal: Normal range of motion. She exhibits no edema.  Neurological: She is alert  and oriented to person, place, and time. No cranial nerve deficit. Coordination normal.  Psychiatric: She has a normal mood and affect.    Assessment & Plan:   See Encounters Tab for problem based charting.  Patient discussed with Dr. Criselda PeachesMullen

## 2017-03-12 LAB — BASIC METABOLIC PANEL
BUN/Creatinine Ratio: 16 (ref 12–28)
BUN: 33 mg/dL — ABNORMAL HIGH (ref 8–27)
CALCIUM: 9.1 mg/dL (ref 8.7–10.3)
CO2: 23 mmol/L (ref 18–29)
Chloride: 104 mmol/L (ref 96–106)
Creatinine, Ser: 2.03 mg/dL — ABNORMAL HIGH (ref 0.57–1.00)
GFR calc Af Amer: 29 mL/min/{1.73_m2} — ABNORMAL LOW (ref >59)
GFR, EST NON AFRICAN AMERICAN: 25 mL/min/{1.73_m2} — AB (ref >59)
GLUCOSE: 154 mg/dL — AB (ref 65–99)
Potassium: 5.4 mmol/L — ABNORMAL HIGH (ref 3.5–5.2)
SODIUM: 143 mmol/L (ref 134–144)

## 2017-03-12 NOTE — Addendum Note (Signed)
Addended by: Hyacinth MeekerAHMED, Karriem Muench on: 03/12/2017 09:11 PM   Modules accepted: Orders

## 2017-03-14 NOTE — Progress Notes (Signed)
Internal Medicine Clinic Attending  Case discussed with Dr. Ahmed at the time of the visit.  We reviewed the resident's history and exam and pertinent patient test results.  I agree with the assessment, diagnosis, and plan of care documented in the resident's note. 

## 2017-03-15 ENCOUNTER — Encounter: Payer: Self-pay | Admitting: Physical Therapy

## 2017-03-15 ENCOUNTER — Encounter: Payer: Self-pay | Admitting: Neurology

## 2017-03-15 ENCOUNTER — Ambulatory Visit: Payer: Medicare Other | Admitting: Physical Therapy

## 2017-03-15 ENCOUNTER — Ambulatory Visit (INDEPENDENT_AMBULATORY_CARE_PROVIDER_SITE_OTHER): Payer: Medicare Other | Admitting: Internal Medicine

## 2017-03-15 VITALS — BP 147/49 | HR 64 | Temp 98.4°F | Ht 65.0 in | Wt 191.0 lb

## 2017-03-15 DIAGNOSIS — R2681 Unsteadiness on feet: Secondary | ICD-10-CM

## 2017-03-15 DIAGNOSIS — E875 Hyperkalemia: Secondary | ICD-10-CM

## 2017-03-15 DIAGNOSIS — M2560 Stiffness of unspecified joint, not elsewhere classified: Secondary | ICD-10-CM

## 2017-03-15 DIAGNOSIS — M25659 Stiffness of unspecified hip, not elsewhere classified: Secondary | ICD-10-CM | POA: Diagnosis not present

## 2017-03-15 DIAGNOSIS — M6281 Muscle weakness (generalized): Secondary | ICD-10-CM | POA: Diagnosis not present

## 2017-03-15 DIAGNOSIS — N184 Chronic kidney disease, stage 4 (severe): Secondary | ICD-10-CM | POA: Diagnosis not present

## 2017-03-15 DIAGNOSIS — M5416 Radiculopathy, lumbar region: Secondary | ICD-10-CM

## 2017-03-15 DIAGNOSIS — R293 Abnormal posture: Secondary | ICD-10-CM

## 2017-03-15 LAB — BASIC METABOLIC PANEL
Anion gap: 7 (ref 5–15)
BUN: 40 mg/dL — AB (ref 6–20)
CALCIUM: 9.4 mg/dL (ref 8.9–10.3)
CO2: 26 mmol/L (ref 22–32)
CREATININE: 2.28 mg/dL — AB (ref 0.44–1.00)
Chloride: 105 mmol/L (ref 101–111)
GFR, EST AFRICAN AMERICAN: 25 mL/min — AB (ref >60)
GFR, EST NON AFRICAN AMERICAN: 21 mL/min — AB (ref >60)
Glucose, Bld: 132 mg/dL — ABNORMAL HIGH (ref 65–99)
POTASSIUM: 5.1 mmol/L (ref 3.5–5.1)
Sodium: 138 mmol/L (ref 135–145)

## 2017-03-15 NOTE — Patient Instructions (Signed)
Cut down the the food that's rich in potassium as listed below  Follow up in 2 months    Hyperkalemia Hyperkalemia is when you have too much potassium in your blood. Potassium is normally removed (excreted) from your body by your kidneys. If there is too much potassium in your blood, it can affect how your heart works. Follow these instructions at home:  Take medicines only as told by your doctor.  Do not take any supplements, natural products, herbs, or vitamins unless your doctor says it is okay.  Limit your alcohol intake as told by your doctor.  Stop illegal drug use. If you need help quitting, ask your doctor.  Keep all follow-up visits as told by your doctor. This is important.  If you have kidney disease, you may need to follow a low potassium diet. A food specialist (dietitian) can help you. Contact a doctor if:  Your heartbeat is not regular or very slow.  You feel dizzy (light-headed).  You feel weak.  You feel sick to your stomach (nauseous).  You have tingling in your hands or feet.  You cannot feel your hands or feet. Get help right away if:  You are short of breath.  You have chest pain.  You pass out (faint).  You cannot move your muscles. This information is not intended to replace advice given to you by your health care provider. Make sure you discuss any questions you have with your health care provider. Document Released: 10/11/2005 Document Revised: 03/18/2016 Document Reviewed: 01/16/2014 Elsevier Interactive Patient Education  2017 [employer]     Food Basics for Chronic Kidney Disease When your kidneys are not working well, they cannot remove waste and excess substances from your blood as effectively as they did before. This can lead to a buildup and imbalance of these substances, which can worsen kidney damage and affect how your body functions. Certain foods lead to a buildup of these substances in the body. By changing your diet as  recommended by your diet and nutrition specialist (dietitian) or health care provider, you could help prevent further kidney damage and delay or prevent the need for dialysis. What are tips for following this plan? General instructions   Work with your health care provider and dietitian to develop a meal plan that is right for you. Foods you can eat, limit, or avoid will be different for each person depending on the stage of kidney disease and any other existing health conditions.  Talk with your health care provider about whether you should take a vitamin and mineral supplement.  Use standard measuring cups and spoons to measure servings of foods. Use a kitchen scale to measure portions of protein foods.  If directed by your health care provider, avoid drinking too much fluid. Measure and count all liquids, including water, ice, soups, flavored gelatin, and frozen desserts such as popsicles or ice cream. Reading food labels   Check the amount of sodium in foods. Choose foods that have less than 300 milligrams (mg) per serving.  Check the ingredient list for phosphorus or potassium-based additives or preservatives.  Check the amount of saturated and trans fat. Limit or avoid these fats as told by your dietitian. Shopping   Avoid buying foods that are:  Processed, frozen, or prepackaged.  Calcium-enriched or fortified.  Do not buy foods that have salt or sodium listed among the first five ingredients.  Do not buy canned vegetables. Cooking   Replace animal proteins, such as meat, fish,  eggs, or dairy, with plant proteins from beans, nuts, and soy.  Use soy milk instead of cow's milk.  Add beans or tofu to soups, casseroles, or pasta dishes instead of meat.  Soak vegetables, such as potatoes, before cooking to reduce potassium. To do this:  Peel and cut into small pieces.  Soak in warm water for at least 2 hours. For every 1 cup of vegetables, use 10 cups of water.  Drain and  rinse with warm water.  Boil for at least 5 minutes. Meal planning   Limit the amount of protein from plant and animal sources you eat each day.  Do not add salt to food when cooking or before eating.  Eat meals and snacks at around the same time each day. If you have diabetes:   If you have diabetes (diabetes mellitus) and chronic kidney disease, it is important to keep your blood glucose in the target range recommended by your health care provider. Follow your diabetes management plan. This may include:  Checking your blood glucose regularly.  Taking oral medicines, insulin, or both.  Exercising for at least 30 minutes on 5 or more days each week, or as told by your health care provider.  Tracking how many servings of carbohydrates you eat at each meal.  You may be given specific guidelines on how much of certain foods and nutrients you may eat, depending on your stage of kidney disease and whether you have high blood pressure (hypertension). Follow your meal plan as told by your dietitian. What nutrients should be limited? The items listed are not a complete list. Talk with your dietitian about what dietary choices are best for you. Potassium  Potassium affects how steadily your heart beats. If too much potassium builds up in your blood, it can cause an irregular heartbeat or even a heart attack. You may need to eat less potassium, depending on your blood potassium levels and the stage of kidney disease. Talk to your dietitian about how much potassium you may have each day. You may need to limit or avoid foods that are high in potassium, such as:  Milk and soy milk.  Fruits, such as bananas, papaya, apricots, nectarines, melon, prunes, raisins, kiwi, and oranges.  Vegetables, such as potatoes, sweet potatoes, yams, tomatoes, leafy greens, beets, okra, avocado, pumpkin, and winter squash.  White and lima beans. Phosphorus  Phosphorus is a mineral found in your bones. A balance  between calcium and phosphorous is needed to build and maintain healthy bones. Too much phosphorus pulls calcium from your bones. This can make your bones weak and more likely to break. Too much phosphorus can also make your skin itch. You may need to eat less phosphorus depending on your blood phosphorus levels and the stage of kidney disease. Talk to your dietitian about how much potassium you may have each day. You may need to take medicine to lower your blood phosphorus levels if diet changes do not help. You may need to limit or avoid foods that are high in phosphorus, such as:  Milk and dairy products.  Dried beans and peas.  Tofu, soy milk, and other soy-based meat replacements.  Colas.  Nuts and peanut butter.  Meat, poultry, and fish.  Bran cereals and oatmeals. Protein  Protein helps you to make and keep muscle. It also helps in the repair of your body's cells and tissues. One of the natural breakdown products of protein is a waste product called urea. When your kidneys are  not working properly, they cannot remove wastes, such as urea, like they did before you developed chronic kidney disease. Reducing how much protein you eat can help prevent a buildup of urea in your blood. Depending on your stage of kidney disease, you may need to limit foods that are high in protein. Sources of animal protein include:  Meat (all types).  Fish and seafood.  Poultry.  Eggs.  Dairy. Other protein foods include:  Beans and legumes.  Nuts and nut butter.  Soy and tofu. Sodium  Sodium, which is found in salt, helps maintain a healthy balance of fluids in your body. Too much sodium can increase your blood pressure and have a negative effect on the function of your heart and lungs. Too much sodium can also cause your body to retain too much fluid, making your kidneys work harder. Most people should have less than 2,300 milligrams (mg) of sodium each day. If you have hypertension, you may  need to limit your sodium to 1,500 mg each day. Talk to your dietitian about how much sodium you may have each day. You may need to limit or avoid foods that are high in sodium, such as:  Salt seasonings.  Soy sauce.  Cured and processed meats.  Salted crackers and snack foods.  Fast food.  Canned soups and most canned foods.  Pickled foods.  Vegetable juice.  Boxed mixes or ready-to-eat boxed meals and side dishes.  Bottled dressings, sauces, and marinades. Summary  more kidney damage and delay or prevent the need for dialysis.  Food adjustments are different for each person with chronic kidney disease. Work with a dietitian to set up nutrient goals and a meal plan that is right for you.  If you have diabetes and chronic kidney disease, it is important to keep your blood glucose in the target range recommended by your health care provider. This information is not intended to replace advice given to you by your health care provider. Make sure you discuss any questions you have with your health care provider. Document Released: 01/01/2003 Document Revised: 10/06/2016 Document Reviewed: 10/06/2016 Elsevier Interactive Patient Education  2017 [employer].

## 2017-03-15 NOTE — Progress Notes (Signed)
   CC: f/up for hyperkalemia  HPI:  [name] is a 66 y.o. with pmh as listed below is here for hyperkalemia f/up  Past Medical History:  Diagnosis Date  . Anemia   . Arthritis   . Borderline personality disorder   . CAD (coronary artery disease)    Catherization 11/18/09>nonobstructive CAD , normal LV function, EF 65%  . Cervicalgia   . Depression   . Diabetes mellitus   . Diabetes mellitus, type II (HCC)   . Diabetic gastroparesis (HCC)   . Diastolic CHF (HCC)    Grade I, on Echo 06/2013, EF 55-60%  . GERD (gastroesophageal reflux disease)   . Gout   . Headache(784.0)   . Health maintenance examination    Colonoscopy 2009> normal; Diabetic eye exam 12/2008. No diabetic retinopathy; Mammogram 11/10> No evidence of malignancy, DXA 03/14/13 : normal  . Hyperlipidemia   . Hypertension   . Iliotibial band syndrome   . Low back pain syndrome   . Nonorganic psychosis   . Peripheral neuropathy   . Right carpal tunnel syndrome 09/13/2014  . SBO (small bowel obstruction) (HCC) 01/09/2013  . Sleep apnea    does not wear CPAP   . Vitamin B12 deficiency   . Vitamin D deficiency   . Wears dentures    upper  . Wears glasses    Patient had borderline K+ chronically close to 4.9-5.5 range. Last time it was 5.4, on repeat today it's 5.1. Has been eating bananas and oranges but in small quanity, also eating tomatoes. Denies any sx other than her chronic back pain and headache. Has CKD 4. She is on betablocker.  Review of Systems:   Review of Systems  Respiratory: Negative for cough.   Cardiovascular: Negative for palpitations and leg swelling.  Gastrointestinal: Negative for heartburn and nausea.  Genitourinary: Negative for dysuria.  Neurological: Positive for headaches. Negative for dizziness.     Physical Exam:  Vitals:   03/15/17 0953  BP: (!) 147/49  Pulse: 64  Temp: 98.4 F (36.9 C)  TempSrc: Oral  SpO2: 99%  Weight: 191 lb (86.6 kg)  Height: 5\' 5"  (1.651 m)    Physical Exam  Constitutional: She is oriented to person, place, and time. She appears well-developed and well-nourished.  Respiratory: Effort normal and breath sounds normal.  Musculoskeletal: Normal range of motion. She exhibits no edema.  Neurological: She is alert and oriented to person, place, and time.    Assessment & Plan:   See Encounters Tab for problem based charting.  Patient discussed with Dr. Criselda PeachesMullen

## 2017-03-15 NOTE — Assessment & Plan Note (Signed)
Patient had borderline K+ chronically close to 4.9-5.5 range. Last time it was 5.4, on repeat today it's 5.1. Has been eating bananas and oranges but in small quanity, also eating tomatoes. Has CKD 4. Not compliance with nephro diet. She is on betablocker as well.  Given her K+ has been stable around 5-5.5 at and usually not much higher, I think she will be able to bring it down lower with dietary change. Gave her printout about low K+ diet. Will recheck K+ next visit. If continues to be high, will d/c her metoprolol and also consider getting urine K+ excretion and rule out hypoAldo.

## 2017-03-15 NOTE — Therapy (Signed)
[facility] [place], [place], [address] Phone: [phone]   Fax:  [phone]  Physical Therapy Treatment  Patient Details  Name: [name] MRN: [id number] Date of Birth: 04-20-1951 Referring Provider: Hulan Saas DO  Encounter Date: 03/15/2017      PT End of Session - 03/15/17 1315    Visit Number 5   Number of Visits 12   Date for PT Re-Evaluation 04/01/17   PT Start Time 1020   PT Stop Time 1110   PT Time Calculation (min) 50 min   Activity Tolerance Patient limited by pain   Behavior During Therapy [facility] for tasks assessed/performed      Past Medical History:  Diagnosis Date  . Anemia   . Arthritis   . Borderline personality disorder   . CAD (coronary artery disease)    Catherization 11/18/09>nonobstructive CAD , normal LV function, EF 65%  . Cervicalgia   . Depression   . Diabetes mellitus   . Diabetes mellitus, type II ([place])   . Diabetic gastroparesis ([place])   . Diastolic CHF ([place])    Grade I, on Echo 06/2013, EF 55-60%  . GERD (gastroesophageal reflux disease)   . Gout   . Headache(784.0)   . Health maintenance examination    Colonoscopy 2009> normal; Diabetic eye exam 12/2008. No diabetic retinopathy; Mammogram 11/10> No evidence of malignancy, DXA 03/14/13 : normal  . Hyperlipidemia   . Hypertension   . Iliotibial band syndrome   . Low back pain syndrome   . Nonorganic psychosis   . Peripheral neuropathy   . Right carpal tunnel syndrome 09/13/2014  . SBO (small bowel obstruction) ([place]) 01/09/2013  . Sleep apnea    does not wear CPAP   . Vitamin B12 deficiency   . Vitamin D deficiency   . Wears dentures    upper  . Wears glasses     Past Surgical History:  Procedure Laterality Date  . ABDOMINAL HYSTERECTOMY    . APPENDECTOMY    . BOWEL RESECTION N/A 01/10/2013   Procedure: SMALL BOWEL RESECTION;  Surgeon: Madilyn Hook, DO;  Location: [place];  Service: General;  Laterality: N/A;  .  CARPAL TUNNEL RELEASE Right 09/13/2014   Procedure: RIGHT WRIST CARPAL TUNNEL RELEASE;  Surgeon: Johnny Bridge, MD;  Location: [place];  Service: Orthopedics;  Laterality: Right;  . CARPAL TUNNEL RELEASE Left 02/28/2015   Procedure: LEFT CARPAL TUNNEL RELEASE;  Surgeon: Marchia Bond, MD;  Location: [place];  Service: Orthopedics;  Laterality: Left;  . HAND SURGERY  1992   rt  . HERNIA REPAIR  03/01/14   Lap incisional hernia repair w/mesh  . INCISIONAL HERNIA REPAIR N/A 03/01/2014   Procedure: LAPAROSCOPIC INCISIONAL HERNIA;  Surgeon: Ralene Ok, MD;  [facility];  Service: General;  Laterality: N/A;  . INSERTION OF MESH N/A 03/01/2014   Procedure: INSERTION OF MESH;  Surgeon: Ralene Ok, MD;  [facility];  Service: General;  Laterality: N/A;  . LAPAROTOMY N/A 01/10/2013   Procedure: EXPLORATORY LAPAROTOMY;  Surgeon: Madilyn Hook, DO;  Location: [place];  Service: General;  Laterality: N/A;  . LYSIS OF ADHESION N/A 01/10/2013   Procedure: LYSIS OF ADHESION;  Surgeon: Madilyn Hook, DO;  Location: [place];  Service: General;  Laterality: N/A;  . STERIOD INJECTION Left 09/13/2014   Procedure: STEROID INJECTION LEFT HAND;  Surgeon: Johnny Bridge, MD;  Location: [place];  Service: Orthopedics;  Laterality: Left;  There were no vitals filed for this visit.      Subjective Assessment - 03/15/17 1116    Subjective I have been hurting too bad to exercise.  up to 9/10.  I was sore after the last session.  I have a MD appointment .  Walking at [facility] yesterday ,  pain increased with spasm.  I was sore after the last visit, I just went home and rested.  I have been unable to do my home exercises due to the pain.    Currently in Pain? Yes   Pain Score 6    Pain Location Hip   Pain Orientation Right   Pain Descriptors / Indicators Aching   Pain Type Chronic pain   Pain Radiating Towards into hips, groin up and down  back   Pain Onset More than a month ago   Pain Frequency Constant   Aggravating Factors  walking, driving. standing   Pain Relieving Factors laying down on my left side with a pillow between knees.   Effect of Pain on Daily Activities Painful with movements   Multiple Pain Sites No                         [facility] Adult PT Treatment/Exercise - 03/15/17 0001      Modalities   Modalities Moist Heat     Moist Heat Therapy   Number Minutes Moist Heat 10 Minutes   Moist Heat Location --  upper/lower back concurrent with IFC     Electrical Stimulation   Electrical Stimulation Location upper/lower back   Electrical Stimulation Action IFC   Electrical Stimulation Parameters 7   Electrical Stimulation Goals Pain     Manual Therapy   Manual Therapy Soft tissue mobilization   Manual therapy comments rib depression right helped ease some scapular pain.    Soft tissue mobilization upper mid and lower back,  light pressure  to decrease pain and spasm                  PT Short Term Goals - 03/15/17 1318      PT SHORT TERM GOAL #1   Title She will be independent with inital HEP   Baseline understands, but too painful to do.   Time 2   Period Weeks   Status On-going     PT SHORT TERM GOAL #2   Title she will report pain decreased 25% or more   Baseline No pain decrease   Time 3   Period Weeks   Status On-going     PT SHORT TERM GOAL #3   Title she will improve lumbar flexion to 50 degrees to demo increased flexibility   Baseline not tested due to pain   Time 3   Period Weeks   Status Unable to assess           PT Long Term Goals - 02/22/17 1057      PT LONG TERM GOAL #1   Title She will be independent with all hEP issued   Time 6   Period Weeks   Status New     PT LONG TERM GOAL #2   Title She will report pain decreased 50% or rmoe with walking > 1 block   Time 6   Period Weeks   Status New     PT LONG TERM GOAL #3   Title She will be  able to stand for 30 min or more  with pain 2-3 max    Time 6   Period Weeks   Status New     PT LONG TERM GOAL #4   Title She will report pain as intermittant with activity during the day   Time 6   Period Weeks   Status New               Plan - 03/15/17 1316    Clinical Impression Statement Patient had a lot of pain today so that was the focus. She has been unable to walk without high numbers of pain.  Exercises so far have not helped.  her back spasms were eased with light soft tissue work and modalities.  She is currently being worked up for kidney function with blood test this morning  and another appointment later today..  No new goals met.    PT Next Visit Plan Progress ROM and core strength exercises, she likes LTR  review standing band exercises.  Modalities/ manual as needed.   PT Home Exercise Plan PPT, LTR, knee to chest,  QL stretches,  Standing band exercises,  ER/ Diagonals, horizontal pulls   Consulted and Agree with Plan of Care Patient      Patient will benefit from skilled therapeutic intervention in order to improve the following deficits and impairments:  Decreased range of motion, Pain, Decreased activity tolerance, Postural dysfunction, Decreased strength, Increased muscle spasms  Visit Diagnosis: Radiculopathy, lumbar region  Muscle weakness (generalized)  Joint stiffness of spine  Stiffness of hip joint, unspecified laterality  Abnormal posture  Unsteadiness on feet     Problem List Patient Active Problem List   Diagnosis Date Noted  . Vaginal dryness 01/12/2017  . Arthritis of sacroiliac joint ([place]) 12/06/2016  . Posterior pain of hip, left 11/25/2016  . Ischial pain 06/21/2016  . Ischial bursitis 06/09/2016  . Vaginal discharge 05/05/2016  . Hepatic steatosis 05/03/2016  . History of Helicobacter pylori infection 05/03/2016  . Diarrhea 02/10/2016  . Nausea 02/10/2016  . Diastolic CHF ([place]) 61/53/7943  . At high risk for falls  12/25/2015  . Secondary hyperparathyroidism of renal origin ([place]) 08/08/2015  . CKD (chronic kidney disease) stage 3, GFR 30-59 ml/min 08/05/2015  . Preventative health care 06/18/2015  . Lateral epicondylitis of right elbow 04/17/2015  . Degenerative lumbar disc 12/30/2014  . Cough   . Degenerative joint disease involving multiple joints on both sides of body (right foot pain) 08/14/2014  . Chronic low back pain 04/15/2014  . Fibromyalgia syndrome 06/18/2013  . Abdominal pain, epigastric 03/29/2013  . Myalgia 12/21/2012  . Chronic daily headache 05/24/2012  . Depression 07/14/2011  . Anxiety 06/02/2011  . Lumbar spondylosis with myelopathy 11/20/2010  . Coronary atherosclerosis 03/10/2010  . Dyslipidemia 12/11/2008  . Vitamin D deficiency 11/26/2008  . Controlled type 2 diabetes mellitus with insulin therapy ([place]) 05/25/2007  . PERIPHERAL NEUROPATHY 05/25/2007  . Essential hypertension 05/25/2007  . GERD 05/25/2007  . SYMPTOM, APNEA, SLEEP NOS 05/25/2007    HARRIS,KAREN PTA 03/15/2017, 1:20 PM  [facility] [address] [place], [place], [address] Phone: [phone]   Fax:  [phone]  Name: [name] MRN: [id number] Date of Birth: Jul 08, 1951

## 2017-03-16 ENCOUNTER — Other Ambulatory Visit: Payer: Self-pay | Admitting: Internal Medicine

## 2017-03-16 NOTE — Progress Notes (Signed)
Internal Medicine Clinic Attending  Case discussed with Dr. Ahmed at the time of the visit.  We reviewed the resident's history and exam and pertinent patient test results.  I agree with the assessment, diagnosis, and plan of care documented in the resident's note. 

## 2017-03-17 ENCOUNTER — Ambulatory Visit: Payer: Medicare Other

## 2017-03-17 DIAGNOSIS — M5416 Radiculopathy, lumbar region: Secondary | ICD-10-CM

## 2017-03-17 DIAGNOSIS — M2560 Stiffness of unspecified joint, not elsewhere classified: Secondary | ICD-10-CM | POA: Diagnosis not present

## 2017-03-17 DIAGNOSIS — R293 Abnormal posture: Secondary | ICD-10-CM | POA: Diagnosis not present

## 2017-03-17 DIAGNOSIS — M25659 Stiffness of unspecified hip, not elsewhere classified: Secondary | ICD-10-CM | POA: Diagnosis not present

## 2017-03-17 DIAGNOSIS — R2681 Unsteadiness on feet: Secondary | ICD-10-CM | POA: Diagnosis not present

## 2017-03-17 DIAGNOSIS — M6281 Muscle weakness (generalized): Secondary | ICD-10-CM

## 2017-03-17 NOTE — Therapy (Addendum)
[place], [place], [address] Phone: [phone]   Fax:  [phone]  Physical Therapy Treatment/Discharge  Patient Details  Name: Shelaine Frie MRN: [id number] Date of Birth: [birth year] Referring Provider: Hulan Saas DO  Encounter Date: 03/17/2017      PT End of Session - 03/17/17 1003    Visit Number 6   Number of Visits 12   Date for PT Re-Evaluation 04/01/17   Authorization Type [employer] MCR   PT Start Time 1005   PT Stop Time 1020   PT Time Calculation (min) 15 min   Activity Tolerance Patient tolerated treatment well   Behavior During Therapy [facility] for tasks assessed/performed      Past Medical History:  Diagnosis Date  . Anemia   . Arthritis   . Borderline personality disorder   . CAD (coronary artery disease)    Catherization 11/18/09>nonobstructive CAD , normal LV function, EF 65%  . Cervicalgia   . Depression   . Diabetes mellitus   . Diabetes mellitus, type II ([place])   . Diabetic gastroparesis ([place])   . Diastolic CHF ([place])    Grade I, on Echo 06/2013, EF 55-60%  . GERD (gastroesophageal reflux disease)   . Gout   . Headache(784.0)   . Health maintenance examination    Colonoscopy 2009> normal; Diabetic eye exam 12/2008. No diabetic retinopathy; Mammogram 11/10> No evidence of malignancy, DXA 03/14/13 : normal  . Hyperlipidemia   . Hypertension   . Iliotibial band syndrome   . Low back pain syndrome   . Nonorganic psychosis   . Peripheral neuropathy   . Right carpal tunnel syndrome 09/13/2014  . SBO (small bowel obstruction) ([place]) 01/09/2013  . Sleep apnea    does not wear CPAP   . Vitamin B12 deficiency   . Vitamin D deficiency   . Wears dentures    upper  . Wears glasses     Past Surgical History:  Procedure Laterality Date  . ABDOMINAL HYSTERECTOMY    . APPENDECTOMY    . BOWEL RESECTION N/A 01/10/2013   Procedure: SMALL BOWEL RESECTION;  Surgeon: Madilyn Hook, DO;   Location: [place];  Service: General;  Laterality: N/A;  . CARPAL TUNNEL RELEASE Right 09/13/2014   Procedure: RIGHT WRIST CARPAL TUNNEL RELEASE;  Surgeon: Johnny Bridge, MD;  Location: [place];  Service: Orthopedics;  Laterality: Right;  . CARPAL TUNNEL RELEASE Left 02/28/2015   Procedure: LEFT CARPAL TUNNEL RELEASE;  Surgeon: Marchia Bond, MD;  Location: [place];  Service: Orthopedics;  Laterality: Left;  . HAND SURGERY  1992   rt  . HERNIA REPAIR  03/01/14   Lap incisional hernia repair w/mesh  . INCISIONAL HERNIA REPAIR N/A 03/01/2014   Procedure: LAPAROSCOPIC INCISIONAL HERNIA;  Surgeon: Ralene Ok, MD;  [facility];  Service: General;  Laterality: N/A;  . INSERTION OF MESH N/A 03/01/2014   Procedure: INSERTION OF MESH;  Surgeon: Ralene Ok, MD;  [facility];  Service: General;  Laterality: N/A;  . LAPAROTOMY N/A 01/10/2013   Procedure: EXPLORATORY LAPAROTOMY;  Surgeon: Madilyn Hook, DO;  Location: [place];  Service: General;  Laterality: N/A;  . LYSIS OF ADHESION N/A 01/10/2013   Procedure: LYSIS OF ADHESION;  Surgeon: Madilyn Hook, DO;  Location: [place];  Service: General;  Laterality: N/A;  . STERIOD INJECTION Left 09/13/2014   Procedure: STEROID INJECTION LEFT HAND;  Surgeon: Johnny Bridge, MD;  Location: [place];  Service: Orthopedics;  Laterality: Left;    There were no vitals filed for this visit.      Subjective Assessment - 03/17/17 1004    Subjective Back about the same. Nothing in PT has helped. does not want to continue. Went to circus last night and have been hurting alot since then.    Currently in Pain? Yes   Pain Score 8    Pain Location Back  hip   Pain Orientation Right   Pain Descriptors / Indicators Aching   Pain Type Chronic pain   Pain Onset More than a month ago   Pain Frequency Constant   Aggravating Factors  walk Gustavus Bryant /stand   Pain Relieving Factors lying Lt side   Effect of Pain on  Daily Activities pain all movements   Multiple Pain Sites No                                   PT Short Term Goals - 03/17/17 1009      PT SHORT TERM GOAL #1   Title She will be independent with inital HEP   Baseline does then 1x/week   Status On-going     PT SHORT TERM GOAL #2   Title she will report pain decreased 25% or more   Baseline No pain decrease   Status On-going     PT SHORT TERM GOAL #3   Title she will improve lumbar flexion to 50 degrees to demo increased flexibility   Baseline 50 degrees today   Status Achieved           PT Long Term Goals - 03/17/17 1011      PT LONG TERM GOAL #1   Title She will be independent with all hEP issued   Status On-going     PT LONG TERM GOAL #2   Title She will report pain decreased 50% or more with walking > 1 block   Status On-going     PT LONG TERM GOAL #3   Title She will be able to stand for 30 min or more with pain 2-3 max    Baseline able to stand 20 min at best   Status On-going     PT LONG TERM GOAL #4   Title She will report pain as intermittant with activity during the day   Status On-going               Plan - 03/17/17 1003    Clinical Impression Statement Ms Garzon reports increased back pain today after sitting for circus last night. She is not doing the HEP on any consistent basis. She only feels the heat helps but she has a heating pad and cold pack she uses at home. When given the option she chose to stop PT . She has a scheduled appoinment  after Dr Thompson Caul appointment on 03/24/17. If she does not come to that appointment she will be discharged. she did ahve some measurable incr in lumbar flexion today   PT Treatment/Interventions Electrical Stimulation;Moist Heat;Patient/family education;Passive range of motion;Manual techniques;Taping;Therapeutic activities;Dry needling;Therapeutic exercise   PT Next Visit Plan Progress ROM and core strength exercises, she likes LTR   review standing band exercises.  Modalities/ manual as needed.   PT Home Exercise Plan PPT, LTR, knee to chest,  QL stretches,  Standing band exercises,  ER/ Diagonals, horizontal pulls   Consulted and Agree with Plan of Care  Patient      Patient will benefit from skilled therapeutic intervention in order to improve the following deficits and impairments:  Decreased range of motion, Pain, Decreased activity tolerance, Postural dysfunction, Decreased strength, Increased muscle spasms  Visit Diagnosis: Radiculopathy, lumbar region  Muscle weakness (generalized)  Joint stiffness of spine  Stiffness of hip joint, unspecified laterality  Abnormal posture     Problem List Patient Active Problem List   Diagnosis Date Noted  . Vaginal dryness 01/12/2017  . Arthritis of sacroiliac joint ([place]) 12/06/2016  . Posterior pain of hip, left 11/25/2016  . Ischial pain 06/21/2016  . Ischial bursitis 06/09/2016  . Vaginal discharge 05/05/2016  . Hepatic steatosis 05/03/2016  . History of Helicobacter pylori infection 05/03/2016  . Diarrhea 02/10/2016  . Nausea 02/10/2016  . Hyperkalemia 01/27/2016  . Diastolic CHF ([place]) 51/11/5850  . At high risk for falls 12/25/2015  . Secondary hyperparathyroidism of renal origin ([place]) 08/08/2015  . CKD (chronic kidney disease) stage 3, GFR 30-59 ml/min 08/05/2015  . Preventative health care 06/18/2015  . Lateral epicondylitis of right elbow 04/17/2015  . Degenerative lumbar disc 12/30/2014  . Cough   . Degenerative joint disease involving multiple joints on both sides of body (right foot pain) 08/14/2014  . Chronic low back pain 04/15/2014  . Fibromyalgia syndrome 06/18/2013  . Abdominal pain, epigastric 03/29/2013  . Myalgia 12/21/2012  . Chronic daily headache 05/24/2012  . Depression 07/14/2011  . Anxiety 06/02/2011  . Lumbar spondylosis with myelopathy 11/20/2010  . Coronary atherosclerosis 03/10/2010  . Dyslipidemia 12/11/2008  . Vitamin D  deficiency 11/26/2008  . Controlled type 2 diabetes mellitus with insulin therapy ([place]) 05/25/2007  . PERIPHERAL NEUROPATHY 05/25/2007  . Essential hypertension 05/25/2007  . GERD 05/25/2007  . SYMPTOM, APNEA, SLEEP NOS 05/25/2007    Darrel Hoover  PT 03/17/2017, 10:32 AM  [facility] [address] [place], [place], [address] Phone: [phone]   Fax:  [phone]  Name: Abegail Kloeppel MRN: [id number] Date of Birth: 01/19/1951  PHYSICAL THERAPY DISCHARGE SUMMARY  Visits from Start of Care: 6  Current functional level related to goals / functional outcomes: See above. Per plan she did not return after MD visit so discharged    Remaining deficits: See above   Education / Equipment: HEP Plan: Patient agrees to discharge.  Patient goals were not met. Patient is being discharged due to lack of progress.  ?????  Noralee Stain  PT 03/24/17   11:41 AM

## 2017-03-22 ENCOUNTER — Encounter: Payer: Self-pay | Admitting: Internal Medicine

## 2017-03-22 ENCOUNTER — Ambulatory Visit: Payer: Medicare Other | Admitting: Physical Therapy

## 2017-03-24 ENCOUNTER — Ambulatory Visit: Payer: Medicare Other

## 2017-03-24 ENCOUNTER — Encounter: Payer: Self-pay | Admitting: Family Medicine

## 2017-03-24 ENCOUNTER — Ambulatory Visit (INDEPENDENT_AMBULATORY_CARE_PROVIDER_SITE_OTHER): Payer: Medicare Other | Admitting: Family Medicine

## 2017-03-24 VITALS — BP 118/72 | HR 64 | Ht 65.0 in | Wt 191.0 lb

## 2017-03-24 DIAGNOSIS — M5416 Radiculopathy, lumbar region: Secondary | ICD-10-CM

## 2017-03-24 DIAGNOSIS — M4716 Other spondylosis with myelopathy, lumbar region: Secondary | ICD-10-CM

## 2017-03-24 NOTE — Assessment & Plan Note (Addendum)
Worsening symptoms. I believe the patient is having more of a lumbar radiculopathy at this point. Discussed with patient at great length. Patient has had an significant interventions previously though. Patient's last MRI was in 2011 and I feel that further evaluation with another MRI would be beneficial at this time. Rule out spinal stenosis and see if patient would be a candidate for epidural steroid injections. Patient does in agreement with plan. Follow-up after MRI to discuss the possibility of an epidural.

## 2017-03-24 NOTE — Patient Instructions (Addendum)
Good to see you  Gustavus Bryantce is your friend.  We will get MRI of yoru back. I think it is somewhere in there that is giving you the pain  I would like to likely do an epidural in your back and should help  When you have the epidural I would like to see you again 2 weeks after the injection.  Keep doing all the other medications Tylenol 2 pills 3 times a day

## 2017-03-24 NOTE — Progress Notes (Signed)
Tawana ScaleZach Smith D.O.  Sports Medicine [address] [place], [place] [address] Phone: [phone] Subjective:   CC: low back strain f/u  BJY:NWGNFAOZHYHPI:Subjective  [name] is a 66 y.o. female coming in with complaint ofpain in her left posterior hip and back.Patient was seen previously and was having signs and symptoms that was more corresponding to a lumbar radicular symptoms. Patient was also having severe pain over the right sacroiliac joint was sent for an sacroiliac joint injection. Patient was to continue all other conservative therapy. Patient states Had one day of relief. No virtually pain came back immediately. Patient states that the pain is severe and is possibly worsening. Affecting daily activities, waking her up at night. Sometimes feels like she has instability of the ankles as well. Bisacodyl aches may give out on her from time to time.   relevant past medical history also includes sacroiliac arthritis which patient has received injections last one 10/17CT pelvis taken 06/24/2016 was independently visualized by me showing moderate to severe degenerative disc disease of the lumbar spine.   Past Medical History:  Diagnosis Date  . Anemia   . Arthritis   . Borderline personality disorder   . CAD (coronary artery disease)    Catherization 11/18/09>nonobstructive CAD , normal LV function, EF 65%  . Cervicalgia   . Depression   . Diabetes mellitus   . Diabetes mellitus, type II (HCC)   . Diabetic gastroparesis (HCC)   . Diastolic CHF (HCC)    Grade I, on Echo 06/2013, EF 55-60%  . GERD (gastroesophageal reflux disease)   . Gout   . Headache(784.0)   . Health maintenance examination    Colonoscopy 2009> normal; Diabetic eye exam 12/2008. No diabetic retinopathy; Mammogram 11/10> No evidence of malignancy, DXA 03/14/13 : normal  . Hyperlipidemia   . Hypertension   . Iliotibial band syndrome   . Low back pain syndrome   . Nonorganic psychosis   . Peripheral neuropathy   . Right  carpal tunnel syndrome 09/13/2014  . SBO (small bowel obstruction) (HCC) 01/09/2013  . Sleep apnea    does not wear CPAP   . Vitamin B12 deficiency   . Vitamin D deficiency   . Wears dentures    upper  . Wears glasses    Past Surgical History:  Procedure Laterality Date  . ABDOMINAL HYSTERECTOMY    . APPENDECTOMY    . BOWEL RESECTION N/A 01/10/2013   Procedure: SMALL BOWEL RESECTION;  Surgeon: Lodema PilotBrian Layton, DO;  [facility];  Service: General;  Laterality: N/A;  . CARPAL TUNNEL RELEASE Right 09/13/2014   Procedure: RIGHT WRIST CARPAL TUNNEL RELEASE;  Surgeon: Eulas PostJoshua P Landau, MD;  Location: [facility];  Service: Orthopedics;  Laterality: Right;  . CARPAL TUNNEL RELEASE Left 02/28/2015   Procedure: LEFT CARPAL TUNNEL RELEASE;  Surgeon: Teryl LucyJoshua Landau, MD;  Location: [facility];  Service: Orthopedics;  Laterality: Left;  . HAND SURGERY  1992   rt  . HERNIA REPAIR  03/01/14   Lap incisional hernia repair w/mesh  . INCISIONAL HERNIA REPAIR N/A 03/01/2014   Procedure: LAPAROSCOPIC INCISIONAL HERNIA;  Surgeon: Axel FillerArmando Ramirez, MD;  Location: [facility];  Service: General;  Laterality: N/A;  . INSERTION OF MESH N/A 03/01/2014   Procedure: INSERTION OF MESH;  Surgeon: Axel FillerArmando Ramirez, MD;  Location[facility];  Service: General;  Laterality: N/A;  . LAPAROTOMY N/A 01/10/2013   Procedure: EXPLORATORY LAPAROTOMY;  Surgeon: Lodema PilotBrian Layton, DO;  Location: [facility];  Service:  General;  Laterality: N/A;  . LYSIS OF ADHESION N/A 01/10/2013   Procedure: LYSIS OF ADHESION;  Surgeon: Lodema Pilot, DO;  Location: [facility];  Service: General;  Laterality: N/A;  . STERIOD INJECTION Left 09/13/2014   Procedure: STEROID INJECTION LEFT HAND;  Surgeon: Eulas Post, MD;  Location: [facility];  Service: Orthopedics;  Laterality: Left;   Social History   Social History  . Marital status: Married    Spouse name: N/A  . Number of children: N/A  . Years of education: N/A   Social  History Main Topics  . Smoking status: Former Smoker    Types: Cigarettes    Quit date: 10/25/2002  . Smokeless tobacco: Never Used  . Alcohol use No  . Drug use: No  . Sexual activity: Not Currently   Other Topics Concern  . None   Social History Narrative  . None   Allergies  Allergen Reactions  . Cimetidine Other (See Comments)    Breast swelling  . Indomethacin Diarrhea    Caused severe diarrhea with episodes of incontinance  . Penicillins Hives, Itching and Other (See Comments)    Gets delusional and gets hives  Has patient had a PCN reaction causing immediate rash, facial/tongue/throat swelling, SOB or lightheadedness with hypotension: Yes Has patient had a PCN reaction causing severe rash involving mucus membranes or skin necrosis: No Has patient had a PCN reaction that required hospitalization No Has patient had a PCN reaction occurring within the last 10 years: No If all of the above answers are "NO", then may proceed with Cephalosporins  . Sulfamethoxazole Other (See Comments)    Unknown reaction    Family History  Problem Relation Age of Onset  . Diabetes Mother   . Hypertension Mother   . Hyperlipidemia Mother   . Arthritis Mother   . Depression Mother   . Heart disease Father   . Diabetes Brother   . Heart disease Brother   . Diabetes Brother   . Heart disease Paternal Grandmother     Past medical history, social, surgical and family history all reviewed in electronic medical record.  No pertanent information unless stated regarding to the chief complaint.   Review of Systems: No headache, visual changes, nausea, vomiting, diarrhea, constipation, dizziness, abdominal pain, skin rash, fevers, chills, night sweats, weight loss, swollen lymph nodes, body aches, joint swelling,  chest pain, shortness of breath, mood changes.  + muscle pain.   Objective  Blood pressure 118/72, pulse 64, height 5\' 5"  (1.651 m), weight 191 lb (86.6 kg), SpO2 97 %.  Systems  examined below as of 03/24/17 General: NAD A&O x3 mood, affect normal  HEENT: Pupils equal, extraocular movements intact no nystagmus Respiratory: not short of breath at rest or with speaking Cardiovascular: No lower extremity edema, non tender Skin: Warm dry intact with no signs of infection or rash on extremities or on axial skeleton. Abdomen: Soft nontender, no masses Neuro: Cranial nerves  intact, neurovascularly intact in all extremities with 2+ DTRs and 2+ pulses. Lymph: No lymphadenopathy appreciated today  Gait normal with good balance and coordination.  MSK: Non tender with full range of motion and good stability and symmetric strength and tone of shoulders, elbows, wrist,  knee hips and ankles bilaterally.  Arthritic changes of multiple joints Back Exam:  Inspection: Loss of lordosis  Motion: Flexion 25 deg, Extension 15 deg, Side Bending to 30 deg bilaterally,  Rotation to 350 deg bilaterally  SLR laying: Significant tightness  in hamstring compared to previous exam XSLR laying: Negative  Palpable tenderness: Worsening discomfort in the paraspinal musculature of the lumbar spine FABER: Worsening tightness bilaterally right greater than left Sensory change: Gross sensation intact to all lumbar and sacral dermatomes.  Reflexes: 2+ at both patellar tendons, 2+ at achilles tendons, Babinski's downgoing.  Strength at foot  4+/5 strength but symmetric    Impression and Recommendations:     This case required medical decision making of moderate complexity.      Note: This dictation was prepared with Dragon dictation along with smaller phrase technology. Any transcriptional errors that result from this process are unintentional.

## 2017-03-29 ENCOUNTER — Ambulatory Visit (HOSPITAL_COMMUNITY): Payer: Self-pay | Admitting: Clinical

## 2017-03-30 DIAGNOSIS — D649 Anemia, unspecified: Secondary | ICD-10-CM | POA: Diagnosis not present

## 2017-03-30 DIAGNOSIS — N2581 Secondary hyperparathyroidism of renal origin: Secondary | ICD-10-CM | POA: Diagnosis not present

## 2017-03-30 DIAGNOSIS — N183 Chronic kidney disease, stage 3 (moderate): Secondary | ICD-10-CM | POA: Diagnosis not present

## 2017-03-30 DIAGNOSIS — N390 Urinary tract infection, site not specified: Secondary | ICD-10-CM | POA: Diagnosis not present

## 2017-03-30 DIAGNOSIS — E1129 Type 2 diabetes mellitus with other diabetic kidney complication: Secondary | ICD-10-CM | POA: Diagnosis not present

## 2017-03-30 DIAGNOSIS — D631 Anemia in chronic kidney disease: Secondary | ICD-10-CM | POA: Diagnosis not present

## 2017-03-30 DIAGNOSIS — I129 Hypertensive chronic kidney disease with stage 1 through stage 4 chronic kidney disease, or unspecified chronic kidney disease: Secondary | ICD-10-CM | POA: Diagnosis not present

## 2017-04-02 ENCOUNTER — Ambulatory Visit
Admission: RE | Admit: 2017-04-02 | Discharge: 2017-04-02 | Disposition: A | Discharge status: Home / Self Care | Payer: Medicare Other | Source: Ambulatory Visit | Attending: Family Medicine | Admitting: Family Medicine

## 2017-04-02 DIAGNOSIS — M5416 Radiculopathy, lumbar region: Secondary | ICD-10-CM

## 2017-04-02 DIAGNOSIS — M5126 Other intervertebral disc displacement, lumbar region: Secondary | ICD-10-CM | POA: Diagnosis not present

## 2017-04-04 ENCOUNTER — Other Ambulatory Visit: Payer: Self-pay | Admitting: Internal Medicine

## 2017-04-04 ENCOUNTER — Encounter: Payer: Self-pay | Admitting: Family Medicine

## 2017-04-04 ENCOUNTER — Other Ambulatory Visit: Payer: Self-pay | Admitting: *Deleted

## 2017-04-04 DIAGNOSIS — M5136 Other intervertebral disc degeneration, lumbar region: Secondary | ICD-10-CM

## 2017-04-05 ENCOUNTER — Encounter: Payer: Self-pay | Admitting: *Deleted

## 2017-04-11 ENCOUNTER — Encounter: Payer: Self-pay | Admitting: Neurology

## 2017-04-11 ENCOUNTER — Other Ambulatory Visit: Payer: Medicare Other

## 2017-04-11 ENCOUNTER — Ambulatory Visit (INDEPENDENT_AMBULATORY_CARE_PROVIDER_SITE_OTHER): Payer: Medicare Other | Admitting: Neurology

## 2017-04-11 VITALS — BP 126/72 | HR 69 | Ht 64.0 in | Wt 195.0 lb

## 2017-04-11 DIAGNOSIS — R519 Headache, unspecified: Secondary | ICD-10-CM

## 2017-04-11 DIAGNOSIS — R51 Headache: Secondary | ICD-10-CM | POA: Diagnosis not present

## 2017-04-11 NOTE — Patient Instructions (Signed)
1. Schedule MRI brain without contrast 2. Bloodwork for ESR, CRP 3. Refer to Physical therapy for cervicogenic headaches 4. Follow-up in 5 months, call for any changes

## 2017-04-11 NOTE — Progress Notes (Signed)
NEUROLOGY CONSULTATION NOTE  [name] MRN: [id number] DOB: 1951-01-16  Referring provider: Dr. Dellia Nims Primary care provider: Dr. Julious Oka  Reason for consult:  headache  Dear Dr Genene Churn:  Thank you for your kind referral of [name] for consultation of the above symptoms. Although her history is well known to you, please allow me to reiterate it for the purpose of our medical record. Records and images were personally reviewed where available.  HISTORY OF PRESENT ILLNESS: This is a 66 year old right-handed woman with a history of hypertension, hyperlipidemia, diabetes, low back pain, CKD, CAD, presenting for evaluation of worsening headaches. She reports having "regular" headaches on occasion through they years, but over the past 6 months, she has had a different type of constant sharp shooting pain over the bilateral temporal regions, as well as tightness in the occipital region radiating down her neck. Sometimes the pain is throbbing over her forehead, with associated photo/phonophobia. She has a history of nausea and denies any worsening with the headaches. She reports neck pain also started 6 months ago. She feels tight in the upper neck, radiating down her shoulders. Pain worsens over the course of the day, it is a constant 2/10 that goes up to an 8/10. No visual obscurations or vision loss. No tinnitus. She takes 1-2 Tylenol around 4 times a week. She denies any focal numbness/tingling/weakness. She has low back pain and was started on gabapentin by Dr. Tamala Julian, with plans for epidural injections. No side effects on gabapentin. No family history of headaches.    PAST MEDICAL HISTORY: Past Medical History:  Diagnosis Date  . Anemia   . Arthritis   . Borderline personality disorder   . CAD (coronary artery disease)    Catherization 11/18/09>nonobstructive CAD , normal LV function, EF 65%  . Cervicalgia   . Depression   . Diabetes mellitus   . Diabetes mellitus, type  II ([place])   . Diabetic gastroparesis ([place])   . Diastolic CHF ([place])    Grade I, on Echo 06/2013, EF 55-60%  . GERD (gastroesophageal reflux disease)   . Gout   . Headache(784.0)   . Health maintenance examination    Colonoscopy 2009> normal; Diabetic eye exam 12/2008. No diabetic retinopathy; Mammogram 11/10> No evidence of malignancy, DXA 03/14/13 : normal  . Hyperlipidemia   . Hypertension   . Iliotibial band syndrome   . Low back pain syndrome   . Nonorganic psychosis   . Peripheral neuropathy   . Right carpal tunnel syndrome 09/13/2014  . SBO (small bowel obstruction) ([place]) 01/09/2013  . Sleep apnea    does not wear CPAP   . Vitamin B12 deficiency   . Vitamin D deficiency   . Wears dentures    upper  . Wears glasses     PAST SURGICAL HISTORY: Past Surgical History:  Procedure Laterality Date  . ABDOMINAL HYSTERECTOMY    . APPENDECTOMY    . BOWEL RESECTION N/A 01/10/2013   Procedure: SMALL BOWEL RESECTION;  Surgeon: Madilyn Hook, DO;  Location: [facility];  Service: General;  Laterality: N/A;  . CARPAL TUNNEL RELEASE Right 09/13/2014   Procedure: RIGHT WRIST CARPAL TUNNEL RELEASE;  Surgeon: Johnny Bridge, MD;  Location: [facility];  Service: Orthopedics;  Laterality: Right;  . CARPAL TUNNEL RELEASE Left 02/28/2015   Procedure: LEFT CARPAL TUNNEL RELEASE;  Surgeon: Marchia Bond, MD;  Location: [facility];  Service: Orthopedics;  Laterality: Left;  . [place]  rt  . HERNIA REPAIR  03/01/14   Lap incisional hernia repair w/mesh  . INCISIONAL HERNIA REPAIR N/A 03/01/2014   Procedure: LAPAROSCOPIC INCISIONAL HERNIA;  Surgeon: Ralene Ok, MD;  [facility];  Service: General;  Laterality: N/A;  . INSERTION OF MESH N/A 03/01/2014   Procedure: INSERTION OF MESH;  Surgeon: Ralene Ok, MD;  [facility];  Service: General;  Laterality: N/A;  . LAPAROTOMY N/A 01/10/2013   Procedure: EXPLORATORY LAPAROTOMY;  Surgeon: Madilyn Hook, DO;   Location: [facility];  Service: General;  Laterality: N/A;  . LYSIS OF ADHESION N/A 01/10/2013   Procedure: LYSIS OF ADHESION;  Surgeon: Madilyn Hook, DO;  Location: [facility];  Service: General;  Laterality: N/A;  . STERIOD INJECTION Left 09/13/2014   Procedure: STEROID INJECTION LEFT HAND;  Surgeon: Johnny Bridge, MD;  Location: [facility];  Service: Orthopedics;  Laterality: Left;    MEDICATIONS: Current Outpatient Prescriptions on File Prior to Visit  Medication Sig Dispense Refill  . ACCU-CHEK AVIVA PLUS test strip TEST UPTO 4 TIMES DAILY 125 each 5  . ACCU-CHEK FASTCLIX LANCETS MISC TEST five times a day 102 each 11  . acetaminophen (TYLENOL) 500 MG tablet Take 325 mg by mouth.    . AMITIZA 24 MCG capsule     . aspirin 81 MG chewable tablet Chew 81 mg by mouth daily.    Marland Kitchen buPROPion (WELLBUTRIN XL) 150 MG 24 hr tablet Take 3 tablets (450 mg total) by mouth daily. 270 tablet 1  . cloNIDine (CATAPRES - DOSED IN MG/24 HR) 0.3 mg/24hr patch Place 1 patch (0.3 mg total) onto the skin once a week. 12 patch 2  . dexlansoprazole (DEXILANT) 60 MG capsule Take 1 capsule (60 mg total) by mouth daily. For reflux 30 capsule   . furosemide (LASIX) 40 MG tablet take 1 tablet by mouth once daily 90 tablet 1  . gabapentin (NEURONTIN) 100 MG capsule Take 1 capsule (100 mg total) by mouth 2 (two) times daily. 60 capsule 2  . gabapentin (NEURONTIN) 300 MG capsule Take 300 mg by mouth 3 (three) times daily.    . Insulin Glargine (LANTUS SOLOSTAR) 100 UNIT/ML Solostar Pen Inject 45 Units into the skin daily.    . Insulin Pen Needle (BD PEN NEEDLE NANO U/F) 32G X 4 MM MISC Use to inject insulin into the skin 100 each 6  . JANUVIA 50 MG tablet take 1 tablet by mouth once daily 90 tablet 4  . LANTUS SOLOSTAR 100 UNIT/ML Solostar Pen inject subcutaneously 51 units at bedtime (Patient taking differently: inject subcutaneously 45 units at bedtime) 30 mL 6  . LINZESS 145 MCG CAPS capsule Take 145 mcg by  mouth daily.  1  . metoprolol succinate (TOPROL-XL) 100 MG 24 hr tablet Take 1 tablet (100 mg total) by mouth every morning. 30 tablet 1  . promethazine (PHENERGAN) 25 MG tablet Take 25 mg by mouth.    . rosuvastatin (CRESTOR) 10 MG tablet Take 1 tablet (10 mg total) by mouth daily. 30 tablet 11  . sertraline (ZOLOFT) 100 MG tablet 2 qam 180 tablet 2  . Vitamin D, Ergocalciferol, (DRISDOL) 50000 units CAPS capsule take 1 capsule by mouth every week 12 capsule 0   No current facility-administered medications on file prior to visit.     ALLERGIES: Allergies  Allergen Reactions  . Cimetidine Other (See Comments)    Breast swelling  . Indomethacin Diarrhea    Caused severe diarrhea with episodes of  incontinance  . Penicillins Hives, Itching and Other (See Comments)    Gets delusional and gets hives  Has patient had a PCN reaction causing immediate rash, facial/tongue/throat swelling, SOB or lightheadedness with hypotension: Yes Has patient had a PCN reaction causing severe rash involving mucus membranes or skin necrosis: No Has patient had a PCN reaction that required hospitalization No Has patient had a PCN reaction occurring within the last 10 years: No If all of the above answers are "NO", then may proceed with Cephalosporins  . Sulfamethoxazole Other (See Comments)    Unknown reaction     FAMILY HISTORY: Family History  Problem Relation Age of Onset  . Diabetes Mother   . Hypertension Mother   . Hyperlipidemia Mother   . Arthritis Mother   . Depression Mother   . Heart disease Father   . Diabetes Brother   . Heart disease Brother   . Diabetes Brother   . Heart disease Paternal Grandmother     SOCIAL HISTORY: Social History   Social History  . Marital status: Married    Spouse name: N/A  . Number of children: N/A  . Years of education: N/A   Occupational History  . Not on file.   Social History Main Topics  . Smoking status: Former Smoker    Types: Cigarettes      Quit date: 10/25/2002  . Smokeless tobacco: Never Used  . Alcohol use No  . Drug use: No  . Sexual activity: Not Currently   Other Topics Concern  . Not on file   Social History Narrative  . No narrative on file    REVIEW OF SYSTEMS: Constitutional: No fevers, chills, or sweats, no generalized fatigue, change in appetite Eyes: No visual changes, double vision, eye pain Ear, nose and throat: No hearing loss, ear pain, nasal congestion, sore throat Cardiovascular: No chest pain, palpitations Respiratory:  No shortness of breath at rest or with exertion, wheezes GastrointestinaI: No nausea, vomiting, diarrhea, abdominal pain, fecal incontinence Genitourinary:  No dysuria, urinary retention or frequency Musculoskeletal:  + neck pain, back pain Integumentary: No rash, pruritus, skin lesions Neurological: as above Psychiatric: No depression, insomnia, anxiety Endocrine: No palpitations, fatigue, diaphoresis, mood swings, change in appetite, change in weight, increased thirst Hematologic/Lymphatic:  No anemia, purpura, petechiae. Allergic/Immunologic: no itchy/runny eyes, nasal congestion, recent allergic reactions, rashes  PHYSICAL EXAM: Vitals:   04/11/17 1019  BP: 126/72  Pulse: 69   General: No acute distress Head:  Normocephalic/atraumatic, +tenderness over the bilateral temporal and occipital regions, no temporal artery ropiness Eyes: Fundoscopic exam shows bilateral sharp discs, no vessel changes, exudates, or hemorrhages Neck: supple, + paraspinal tenderness, full range of motion Back: No paraspinal tenderness Heart: regular rate and rhythm Lungs: Clear to auscultation bilaterally. Vascular: No carotid bruits. Skin/Extremities: No rash, no edema Neurological Exam: Mental status: alert and oriented to person, place, and time, no dysarthria or aphasia, Fund of knowledge is appropriate.  Recent and remote memory are intact.  Attention and concentration are normal.    Able  to name objects and repeat phrases. Cranial nerves: CN I: not tested CN II: pupils equal, round and reactive to light, visual fields intact, fundi unremarkable. CN III, IV, VI:  full range of motion, no nystagmus, no ptosis CN V: facial sensation intact CN VII: upper and lower face symmetric CN VIII: hearing intact to finger rub CN IX, X: gag intact, uvula midline CN XI: sternocleidomastoid and trapezius muscles intact CN XII: tongue midline Bulk &  Tone: normal, no fasciculations. Motor: 5/5 throughout with no pronator drift. Sensation: intact to light touch, cold, pin, vibration and joint position sense.  No extinction to double simultaneous stimulation.  Romberg test negative Deep Tendon Reflexes: +1 throughout, no ankle clonus Plantar responses: downgoing bilaterally Cerebellar: no incoordination on finger to nose, heel to shin. No dysdiadochokinesia Gait: narrow-based and steady, difficulty with tandem walk Tremor: none  IMPRESSION: This is a 66 year old right-handed woman with a history of  hypertension, hyperlipidemia, diabetes, low back pain, CKD, CAD, presenting for evaluation of worsening headaches for the past 6 months. She denies any prior significant history of headaches until they became constant the past 6 months. With new onset headaches in this age group, temporal arteritis needs to be ruled out, although this is unlikely. Check ESR and CRP. Headaches most likely cervicogenic in etiology, MRI brain without contrast will be ordered to assess for underlying structural abnormality. Prior MRI brain in 11/2015 noted a non-expanded partially empty sella, she does not have typical symptoms of pseudotumor cerebri. We discussed she is already on multiple medications, and may benefit more from physical therapy for neck pain and cervicogenic headaches. She will follow-up in 5 months and knows to call for any changes.   Thank you for allowing me to participate in the care of this patient.  Please do not hesitate to call for any questions or concerns.   Ellouise Newer, M.D.  CC: Dr. Genene Churn, Dr. Hulen Luster, Dr. Tamala Julian

## 2017-04-12 LAB — C-REACTIVE PROTEIN: CRP: 11 mg/L — ABNORMAL HIGH (ref <8.0)

## 2017-04-12 LAB — SEDIMENTATION RATE: Sed Rate: 38 mm/hr — ABNORMAL HIGH (ref 0–30)

## 2017-04-13 ENCOUNTER — Encounter: Payer: Self-pay | Admitting: Internal Medicine

## 2017-04-19 ENCOUNTER — Encounter (HOSPITAL_COMMUNITY): Payer: Self-pay | Admitting: Clinical

## 2017-04-19 ENCOUNTER — Ambulatory Visit (INDEPENDENT_AMBULATORY_CARE_PROVIDER_SITE_OTHER): Payer: Medicare Other | Admitting: Clinical

## 2017-04-19 DIAGNOSIS — F411 Generalized anxiety disorder: Secondary | ICD-10-CM

## 2017-04-19 DIAGNOSIS — F331 Major depressive disorder, recurrent, moderate: Secondary | ICD-10-CM | POA: Diagnosis not present

## 2017-04-19 NOTE — Progress Notes (Signed)
qg  THERAPIST PROGRESS NOTE  Session Time: 9:15 - 10:00   Participation Level: Active  Behavioral Response: CasualAlertDepressed  Type of Therapy: Individual Therapy  Treatment Goals addressed: Improve Psychiatric Symptoms, Calming Skills, Emotional Regulation Skills,   Interventions: Motivational Interviewing, psychoeducation  Summary: [name] is a 66 y.o. female who presents with Major Depressive Disorder, Generalized Anxiety Disorder   Suicidal/Homicidal: No -without intent/plan  Therapist Response:  Nekeshia met with clinician for an individual session. Jackee shared about her psychiatric symptoms, her current life events and her homework. Jatara shared that she has been experiencing depression and having a difficult time lately. Clinician asked open ended questions and Zandrea shared that she is very tired of taking so much medicine. She shared that she is has been having headaches that require an MRI and therapy. She shared that she experiences shooting pains in her head. She shared she has been having other aches and pains. In addition she has been very concerned about her mothers mental health. She shared that she seems depressed. ( she has a Education officer, museum). Clinician asked open ended questions and Tamrah identified some of her negative automatic thoughts. Clinician about the emotions that accompanied her thoughts. Clinician asked open ended questions and Koreena identified what was in her power to change and what was not. Client and clinician discussed how engaging rather than isolating can help make her burdens lighter. Clinician asked about how she would like to feel. Clinician asked what activities make her feel better - dressing better, decorating her space, spending time with her grandchild, going to church.  Client and clinician discussed how her mood improves just in the talking about those things. Client and clinician discussed her diagnosis. Client and clinician discussed and  reviewed some of the other skills she has learned in therapy and how to apply them. Clinician informed Calyn that she would be leaving [employer]. Clinician asked open ended questions and Andelyn shared her thoughts and emotions about clinician's departure.  Client and clinician discussed her options for continuing therapy with another clinician    Plan: Return again in 3 weeks.  Diagnosis:     Axis I: Major Depressive Disorder, Generalized Anxiety Disorder     Demetre Monaco A, LCSW 04/19/2017

## 2017-04-20 ENCOUNTER — Ambulatory Visit
Admission: RE | Admit: 2017-04-20 | Discharge: 2017-04-20 | Disposition: A | Discharge status: Home / Self Care | Payer: Medicare Other | Source: Ambulatory Visit | Attending: Family Medicine | Admitting: Family Medicine

## 2017-04-20 DIAGNOSIS — M545 Low back pain: Secondary | ICD-10-CM | POA: Diagnosis not present

## 2017-04-20 DIAGNOSIS — M5136 Other intervertebral disc degeneration, lumbar region: Secondary | ICD-10-CM

## 2017-04-20 MED ORDER — IOPAMIDOL (ISOVUE-M 200) INJECTION 41%
1.0000 mL | Freq: Once | INTRAMUSCULAR | Status: AC
  Administered 2017-04-20: 1 mL via INTRA_ARTICULAR

## 2017-04-20 MED ORDER — METHYLPREDNISOLONE ACETATE 40 MG/ML INJ SUSP (RADIOLOG
120.0000 mg | Freq: Once | INTRAMUSCULAR | Status: AC
  Administered 2017-04-20: 120 mg via INTRA_ARTICULAR

## 2017-04-20 NOTE — Discharge Instructions (Signed)

## 2017-04-24 ENCOUNTER — Other Ambulatory Visit: Payer: Self-pay | Admitting: Internal Medicine

## 2017-04-24 ENCOUNTER — Ambulatory Visit
Admission: RE | Admit: 2017-04-24 | Discharge: 2017-04-24 | Disposition: A | Discharge status: Home / Self Care | Payer: Medicare Other | Source: Ambulatory Visit | Attending: Neurology | Admitting: Neurology

## 2017-04-24 DIAGNOSIS — I10 Essential (primary) hypertension: Secondary | ICD-10-CM

## 2017-04-24 DIAGNOSIS — R51 Headache: Principal | ICD-10-CM

## 2017-04-24 DIAGNOSIS — R519 Headache, unspecified: Secondary | ICD-10-CM

## 2017-04-26 ENCOUNTER — Other Ambulatory Visit: Payer: Self-pay | Admitting: Internal Medicine

## 2017-04-26 ENCOUNTER — Other Ambulatory Visit: Payer: Self-pay

## 2017-04-26 ENCOUNTER — Telehealth: Payer: Self-pay

## 2017-04-26 DIAGNOSIS — M542 Cervicalgia: Secondary | ICD-10-CM

## 2017-04-26 DIAGNOSIS — I10 Essential (primary) hypertension: Secondary | ICD-10-CM

## 2017-04-26 NOTE — Telephone Encounter (Signed)
-----   Message from Van ClinesKaren M Aquino, MD sent at 04/25/2017  8:41 AM EDT ----- Pls let her know the brain scan is unchanged from last year, no evidence of tumor, stroke, or bleed. Proceed with PT for neck pain to help with the headaches. Thanks

## 2017-04-26 NOTE — Telephone Encounter (Signed)
Spoke with pt relaying message below.  Pt appreciative.  

## 2017-05-01 ENCOUNTER — Other Ambulatory Visit: Payer: Self-pay | Admitting: Family Medicine

## 2017-05-03 ENCOUNTER — Encounter: Payer: Self-pay | Admitting: Family Medicine

## 2017-05-10 ENCOUNTER — Encounter (HOSPITAL_COMMUNITY)
Admission: RE | Admit: 2017-05-10 | Discharge: 2017-05-10 | Disposition: A | Discharge status: Home / Self Care | Payer: Medicare Other | Source: Ambulatory Visit | Attending: Oral Surgery | Admitting: Oral Surgery

## 2017-05-10 ENCOUNTER — Encounter (HOSPITAL_COMMUNITY): Payer: Self-pay

## 2017-05-10 DIAGNOSIS — F419 Anxiety disorder, unspecified: Secondary | ICD-10-CM | POA: Diagnosis not present

## 2017-05-10 DIAGNOSIS — E559 Vitamin D deficiency, unspecified: Secondary | ICD-10-CM | POA: Diagnosis not present

## 2017-05-10 DIAGNOSIS — K589 Irritable bowel syndrome without diarrhea: Secondary | ICD-10-CM | POA: Diagnosis not present

## 2017-05-10 DIAGNOSIS — K219 Gastro-esophageal reflux disease without esophagitis: Secondary | ICD-10-CM | POA: Diagnosis not present

## 2017-05-10 DIAGNOSIS — M270 Developmental disorders of jaws: Secondary | ICD-10-CM | POA: Diagnosis not present

## 2017-05-10 DIAGNOSIS — Z87891 Personal history of nicotine dependence: Secondary | ICD-10-CM | POA: Diagnosis not present

## 2017-05-10 DIAGNOSIS — I5032 Chronic diastolic (congestive) heart failure: Secondary | ICD-10-CM | POA: Diagnosis not present

## 2017-05-10 DIAGNOSIS — Z79899 Other long term (current) drug therapy: Secondary | ICD-10-CM | POA: Diagnosis not present

## 2017-05-10 DIAGNOSIS — E1143 Type 2 diabetes mellitus with diabetic autonomic (poly)neuropathy: Secondary | ICD-10-CM | POA: Diagnosis not present

## 2017-05-10 DIAGNOSIS — Z794 Long term (current) use of insulin: Secondary | ICD-10-CM | POA: Diagnosis not present

## 2017-05-10 DIAGNOSIS — K056 Periodontal disease, unspecified: Secondary | ICD-10-CM | POA: Diagnosis not present

## 2017-05-10 DIAGNOSIS — I110 Hypertensive heart disease with heart failure: Secondary | ICD-10-CM | POA: Diagnosis not present

## 2017-05-10 DIAGNOSIS — K3184 Gastroparesis: Secondary | ICD-10-CM | POA: Diagnosis not present

## 2017-05-10 DIAGNOSIS — G4730 Sleep apnea, unspecified: Secondary | ICD-10-CM | POA: Diagnosis not present

## 2017-05-10 DIAGNOSIS — E785 Hyperlipidemia, unspecified: Secondary | ICD-10-CM | POA: Diagnosis not present

## 2017-05-10 DIAGNOSIS — M797 Fibromyalgia: Secondary | ICD-10-CM | POA: Diagnosis not present

## 2017-05-10 DIAGNOSIS — F329 Major depressive disorder, single episode, unspecified: Secondary | ICD-10-CM | POA: Diagnosis not present

## 2017-05-10 DIAGNOSIS — E538 Deficiency of other specified B group vitamins: Secondary | ICD-10-CM | POA: Diagnosis not present

## 2017-05-10 DIAGNOSIS — I2510 Atherosclerotic heart disease of native coronary artery without angina pectoris: Secondary | ICD-10-CM | POA: Diagnosis not present

## 2017-05-10 DIAGNOSIS — E1142 Type 2 diabetes mellitus with diabetic polyneuropathy: Secondary | ICD-10-CM | POA: Diagnosis not present

## 2017-05-10 HISTORY — DX: Personal history of other diseases of the digestive system: Z87.19

## 2017-05-10 HISTORY — DX: Irritable bowel syndrome, unspecified: K58.9

## 2017-05-10 HISTORY — DX: Dyspnea, unspecified: R06.00

## 2017-05-10 HISTORY — DX: Dental caries, unspecified: K02.9

## 2017-05-10 HISTORY — DX: Chronic kidney disease, stage 4 (severe): N18.4

## 2017-05-10 HISTORY — DX: Fibromyalgia: M79.7

## 2017-05-10 HISTORY — DX: Suicide attempt, initial encounter: T14.91XA

## 2017-05-10 LAB — CBC
HEMATOCRIT: 29.6 % — AB (ref 36.0–46.0)
Hemoglobin: 9.2 g/dL — ABNORMAL LOW (ref 12.0–15.0)
MCH: 24.9 pg — AB (ref 26.0–34.0)
MCHC: 31.1 g/dL (ref 30.0–36.0)
MCV: 80 fL (ref 78.0–100.0)
Platelets: 280 10*3/uL (ref 150–400)
RBC: 3.7 MIL/uL — ABNORMAL LOW (ref 3.87–5.11)
RDW: 16.7 % — AB (ref 11.5–15.5)
WBC: 8.5 10*3/uL (ref 4.0–10.5)

## 2017-05-10 LAB — NO BLOOD PRODUCTS

## 2017-05-10 LAB — BASIC METABOLIC PANEL
Anion gap: 9 (ref 5–15)
BUN: 32 mg/dL — AB (ref 6–20)
CALCIUM: 9.4 mg/dL (ref 8.9–10.3)
CHLORIDE: 107 mmol/L (ref 101–111)
CO2: 24 mmol/L (ref 22–32)
CREATININE: 2.04 mg/dL — AB (ref 0.44–1.00)
GFR calc Af Amer: 28 mL/min — ABNORMAL LOW (ref >60)
GFR calc non Af Amer: 24 mL/min — ABNORMAL LOW (ref >60)
GLUCOSE: 119 mg/dL — AB (ref 65–99)
Potassium: 5.2 mmol/L — ABNORMAL HIGH (ref 3.5–5.1)
Sodium: 140 mmol/L (ref 135–145)

## 2017-05-10 LAB — GLUCOSE, CAPILLARY: Glucose-Capillary: 156 mg/dL — ABNORMAL HIGH (ref 65–99)

## 2017-05-10 NOTE — Progress Notes (Signed)
Pt denies any acute cardiopulmonary issues. Pt under the care of Dr. Swazil[place]Jordan, Cardiology. Pt stated that she last saw Dr. Swazil[place]Jordan " last year." Pt chart forwarded to anesthesia to review cardiac history and abnormal labs.

## 2017-05-10 NOTE — Pre-Procedure Instructions (Addendum)
[name]  05/10/2017      [facility] - [place], [place] - [address] [place] [address] Phone: [phone] Fax: [phone]    Your procedure is scheduled on Friday, May 13, 2017  Report to [facility] Admitting at 7:30 A.M.  Call this number if you have problems the morning of surgery:  [phone]   Remember:  Do not eat food or drink liquids after midnight Thursday, May 12, 2017  Take these medicines the morning of surgery with A SIP OF WATER:buPROPion (WELLBUTRIN XL), dexlansoprazole (DEXILANT), gabapentin (NEURONTIN), metoprolol succinate (TOPROL-XL), if needed:promethazine (PHENERGAN) for nausea or vomiting  Stop taking Aspirin, vitamins, fish oil, Turmeric Curcumin, Tart Cherry, Biotinand herbal medications. Do not take any NSAIDs ie: Ibuprofen, Advil, Naproxen or any medication containing Aspirin; stop now.    How to Manage Your Diabetes Before and After Surgery  Why is it important to control my blood sugar before and after surgery? . Improving blood sugar levels before and after surgery helps healing and can limit problems. . A way of improving blood sugar control is eating a healthy diet by: o  Eating less sugar and carbohydrates o  Increasing activity/exercise o  Talking with your doctor about reaching your blood sugar goals . High blood sugars (greater than 180 mg/dL) can raise your risk of infections and slow your recovery, so you will need to focus on controlling your diabetes during the weeks before surgery. . Make sure that the doctor who takes care of your diabetes knows about your planned surgery including the date and location.  How do I manage my blood sugar before surgery? . Check your blood sugar at least 4 times a day, starting 2 days before surgery, to make sure that the level is not too high or low. o Check your blood sugar the morning of your surgery when you wake up  and every 2 hours until you get to the Short Stay unit. . If your blood sugar is less than 70 mg/dL, you will need to treat for low blood sugar: o Do not take insulin. o Treat a low blood sugar (less than 70 mg/dL) with  cup of clear juice (cranberry or apple), 4 glucose tablets, OR glucose gel. o Recheck blood sugar in 15 minutes after treatment (to make sure it is greater than 70 mg/dL). If your blood sugar is not greater than 70 mg/dL on recheck, call [phone] for further instructions. . Report your blood sugar to the short stay nurse when you get to Short Stay.  . If you are admitted to the hospital after surgery: o Your blood sugar will be checked by the staff and you will probably be given insulin after surgery (instead of oral diabetes medicines) to make sure you have good blood sugar levels. o The goal for blood sugar control after surgery is 80-180 mg/dL.  WHAT DO I DO ABOUT MY DIABETES MEDICATION?  Marland Kitchen Do not take oral diabetes medicines (pills) the morning of surgery such as JANUVIA  . THE NIGHT BEFORE SURGERY, take 22 units of  Lantus insulin.      Reviewed and Endorsed by [facility] Patient Education Committee, August 2015  Do not wear jewelry, make-up or nail polish.  Do not wear lotions, powders, or perfumes, or deoderant.  Do not shave 48 hours prior to surgery.    Do not bring valuables to the hospital.  [facility] is not responsible  for any belongings or valuables.  Patients discharged the day of surgery will not be allowed to drive home.  Special instructions: Shower the night before surgery and the morning of surgery with CHG. Please read over the following fact sheets that you were given. Pain Booklet and Surgical Site Infection Prevention

## 2017-05-11 ENCOUNTER — Encounter (HOSPITAL_COMMUNITY): Payer: Self-pay

## 2017-05-11 LAB — HEMOGLOBIN A1C
HEMOGLOBIN A1C: 8.5 % — AB (ref 4.8–5.6)
MEAN PLASMA GLUCOSE: 197 mg/dL

## 2017-05-11 NOTE — H&P (Signed)
HISTORY AND PHYSICAL  [name] is a 66 y.o. female patient  Referred by general dentist for removal remaining teeth in preparation for lower dentue.  No diagnosis found.  Past Medical History:  Diagnosis Date  . Anemia   . Arthritis   . Borderline personality disorder   . CAD (coronary artery disease)    Catherization 11/18/09>nonobstructive CAD , normal LV function, EF 65%  . Cervicalgia   . Dental caries    periodontitis and mandibular tori  . Depression   . Diabetes mellitus   . Diabetes mellitus, type II (HCC)   . Diabetic gastroparesis (HCC)   . Diastolic CHF (HCC)    Grade I, on Echo 06/2013, EF 55-60%  . Dyspnea   . Fibromyalgia   . GERD (gastroesophageal reflux disease)   . Gout   . Headache(784.0)   . Health maintenance examination    Colonoscopy 2009> normal; Diabetic eye exam 12/2008. No diabetic retinopathy; Mammogram 11/10> No evidence of malignancy, DXA 03/14/13 : normal  . History of hiatal hernia   . Hyperlipidemia   . Hypertension   . IBS (irritable bowel syndrome)   . Iliotibial band syndrome   . Low back pain syndrome   . Nonorganic psychosis   . Peripheral neuropathy   . Right carpal tunnel syndrome 09/13/2014  . SBO (small bowel obstruction) (HCC) 01/09/2013  . Sleep apnea    does not wear CPAP   . Suicide attempt [facility])    " by overdose of whatever I had"  . Vitamin B12 deficiency   . Vitamin D deficiency   . Wears dentures    upper  . Wears glasses   . Wears glasses     No current facility-administered medications for this encounter.    Current Outpatient Prescriptions  Medication Sig Dispense Refill  . acetaminophen (TYLENOL) 325 MG tablet Take 650 mg by mouth 3 (three) times daily as needed for mild pain or headache.    . AMITIZA 24 MCG capsule Take 24 mcg by mouth 2 (two) times daily.     Marland Kitchen aspirin EC 81 MG tablet Take 81 mg by mouth daily.    . Biotin 5000 MCG TABS Take 5,000 mcg by mouth daily.    Marland Kitchen buPROPion (WELLBUTRIN XL) 150 MG  24 hr tablet Take 3 tablets (450 mg total) by mouth daily. 270 tablet 1  . cloNIDine (CATAPRES - DOSED IN MG/24 HR) 0.3 mg/24hr patch Place 1 patch (0.3 mg total) onto the skin once a week. 12 patch 2  . dexlansoprazole (DEXILANT) 60 MG capsule Take 1 capsule (60 mg total) by mouth daily. For reflux 30 capsule   . fluconazole (DIFLUCAN) 150 MG tablet Take 150 mg by mouth every Wednesday.    . furosemide (LASIX) 40 MG tablet take 1 tablet by mouth once daily 90 tablet 1  . gabapentin (NEURONTIN) 100 MG capsule take 1 capsule by mouth twice a day 60 capsule 2  . gabapentin (NEURONTIN) 300 MG capsule Take 300 mg by mouth daily.     Marland Kitchen JANUVIA 50 MG tablet take 1 tablet by mouth once daily 90 tablet 4  . LANTUS SOLOSTAR 100 UNIT/ML Solostar Pen inject subcutaneously 51 units at bedtime (Patient taking differently: inject subcutaneously 45 units at bedtime) 30 mL 6  . LINZESS 145 MCG CAPS capsule Take 145 mcg by mouth daily as needed (consitpation).   1  . metoprolol succinate (TOPROL-XL) 100 MG 24 hr tablet take 1 tablet by mouth every morning 30  tablet 6  . Misc Natural Products (TART CHERRY ADVANCED PO) Take 1 capsule by mouth every evening.    . promethazine (PHENERGAN) 25 MG tablet Take 25 mg by mouth 2 (two) times daily as needed for nausea or vomiting.     . rosuvastatin (CRESTOR) 10 MG tablet Take 1 tablet (10 mg total) by mouth daily. 30 tablet 11  . sertraline (ZOLOFT) 100 MG tablet 2 qam (Patient taking differently: Take 200 mg by mouth at bedtime. ) 180 tablet 2  . TURMERIC CURCUMIN PO Take 1 tablet by mouth at bedtime.    . Vitamin D, Ergocalciferol, (DRISDOL) 50000 units CAPS capsule take 1 capsule by mouth every week 12 capsule 0  . ACCU-CHEK AVIVA PLUS test strip TEST UPTO 4 TIMES DAILY 125 each 5  . ACCU-CHEK FASTCLIX LANCETS MISC TEST five times a day 102 each 11  . Insulin Pen Needle (BD PEN NEEDLE NANO U/F) 32G X 4 MM MISC Use to inject insulin into the skin 100 each 6   Allergies   Allergen Reactions  . Cimetidine Other (See Comments)    Tagamet-Breast swelling  . Indomethacin Diarrhea    Caused severe diarrhea with episodes of incontinance  . Penicillins Hives, Itching and Other (See Comments)    Gets delusional and gets hives  Has patient had a PCN reaction causing immediate rash, facial/tongue/throat swelling, SOB or lightheadedness with hypotension: Yes Has patient had a PCN reaction causing severe rash involving mucus membranes or skin necrosis: No Has patient had a PCN reaction that required hospitalization No Has patient had a PCN reaction occurring within the last 10 years: No If all of the above answers are "NO", then may proceed with Cephalosporins  . Adhesive [Tape] Other (See Comments)    Causes bruises Okay to use paper tape only  . Sulfamethoxazole Other (See Comments)    Unknown reaction    Active Problems:   * No active hospital problems. *  Vitals: There were no vitals taken for this visit. Lab results:No results found. However, due to the size of the patient record, not all encounters were searched. Please check Results Review for a complete set of results. Radiology Results: No results found. General appearance: alert, cooperative, no distress and moderately obese Head: Normocephalic, without obvious abnormality, atraumatic Eyes: negative Nose: Nares normal. Septum midline. Mucosa normal. No drainage or sinus tenderness. Throat: Edentulous maxilla. Teeth #22-27 remain in mandible with decay and bone loss. Bilateral mandibular lingual tori. Pharynx clear. Neck: no adenopathy, supple, symmetrical, trachea midline and thyroid not enlarged, symmetric, no tenderness/mass/nodules Resp: clear to auscultation bilaterally Cardio: regular rate and rhythm, S1, S2 normal, no murmur, click, rub or gallop  Assessment: Nonrestorable teeth, bilateral mandibular lingual tori.  Plan: Dental extractions with alveoloplasty. Removal mandibular lingual tori.  GA. Nasal intubation. Day surgery.   Georgia LopesJENSEN,Malon Branton M 05/11/2017

## 2017-05-11 NOTE — Progress Notes (Signed)
Anesthesia Chart Review:  Pt is a 66 year old female scheduled for dental restoration/extractions on 05/13/2017 with Ocie DoyneScott Jensen, DDS  - [facility]. Last office visit 03/15/17 with Hyacinth Meekerasrif Ahmed, MD.  - Used to see Peter SwazilandJordan, MD with cardiology. Last office visit 08/02/14  PMH includes:  CAD, CHF, HTN, DM, hyperlipidemia, CKD (stage IV). anemia, OSA, GERD. Former smoker. BMI 34  Medications include: ASA 81 mg, clonidine patch, excellent, Lasix, Januvia, Lantus, metoprolol, rosuvastatin  BP 138/68 Comment: rechecked manually  Pulse 62   Temp 36.9 C   Resp 20   Ht 5\' 4"  (1.626 m)   Wt 196 lb (88.9 kg)   SpO2 99%   BMI 33.64 kg/m    Preoperative labs reviewed. - Cr 2.04, BUN 32.  This is consistent with prior results - H/H 9.2/29.6. Hgb 12.5 on 02/04/16.  - HbA1c 8.5, glucose 119  EKG 02/12/17: NSR  Nuclear stress test 08/14/14:  - Low risk stress nuclear study with a small area of mild ischemia in the inferior wall.  - Cardiologist Peter SwazilandJordan, MD commented "Very mild inferior wall defect. Normal EF. Findings are consistent with cardiac cath findings in 2011 and this is a low risk study"  Echo 07/06/13:  - Procedure narrative: Transthoracic echocardiography. Imagequality was adequate. The study was technically difficult. - Left ventricle: The cavity size was normal. Wall thicknesswas increased in a pattern of mild LVH. Systolic functionwas normal. The estimated ejection fraction was in therange of 55% to 60%. Doppler parameters are consistentwith abnormal left ventricular relaxation (grade 1diastolic dysfunction). - Aortic valve: Mild regurgitation.  Cardiac cath 11/18/09:  1. LM distal 25% stenosis 2. LAD: long proximal - mid 25% stenosis. D1 ostial 25% stenosis. D2 ostial 60% stenosis. 3. CX: long mid 25% stenosis. Long 40-50% stenosis after mid obtuse marginal. Mid obtuse marginal proximal 25% stenosis. Posterolateral mid  25% stenosis. 4. RCA: long proximal - mid 50% stenosis. Distal 60-70% stenosis. PDA moderate-sized, normal. Posterolateral small, normal.  Reviewed case with Dr. Aleene DavidsonE. Fitzgerald, MD.  No CV sx documented at PAT.  If no changes, I anticipate pt can proceed with surgery as scheduled.   Rica Mastngela Karri Kallenbach, FNP-BC [facility]/Anesthesiology Phone: [phone] 05/11/2017 1:42 PM

## 2017-05-12 MED ORDER — CLINDAMYCIN PHOSPHATE 600 MG/50ML IV SOLN
600.0000 mg | INTRAVENOUS | Status: AC
  Administered 2017-05-13: 600 mg via INTRAVENOUS
  Filled 2017-05-12: qty 50

## 2017-05-13 ENCOUNTER — Encounter (HOSPITAL_COMMUNITY): Payer: Self-pay | Admitting: Certified Registered Nurse Anesthetist

## 2017-05-13 ENCOUNTER — Ambulatory Visit (HOSPITAL_COMMUNITY)
Admission: RE | Admit: 2017-05-13 | Discharge: 2017-05-13 | Disposition: A | Discharge status: Home / Self Care | Payer: Medicare Other | Source: Ambulatory Visit | Attending: Oral Surgery | Admitting: Oral Surgery

## 2017-05-13 ENCOUNTER — Ambulatory Visit (HOSPITAL_COMMUNITY): Payer: Medicare Other | Admitting: Emergency Medicine

## 2017-05-13 ENCOUNTER — Ambulatory Visit (HOSPITAL_COMMUNITY): Payer: Medicare Other | Admitting: Certified Registered Nurse Anesthetist

## 2017-05-13 ENCOUNTER — Encounter (HOSPITAL_COMMUNITY)
Admission: RE | Disposition: A | Discharge status: Home / Self Care | Payer: Self-pay | Source: Ambulatory Visit | Attending: Oral Surgery

## 2017-05-13 DIAGNOSIS — M270 Developmental disorders of jaws: Secondary | ICD-10-CM | POA: Insufficient documentation

## 2017-05-13 DIAGNOSIS — K3184 Gastroparesis: Secondary | ICD-10-CM | POA: Diagnosis not present

## 2017-05-13 DIAGNOSIS — K056 Periodontal disease, unspecified: Secondary | ICD-10-CM | POA: Diagnosis not present

## 2017-05-13 DIAGNOSIS — E1143 Type 2 diabetes mellitus with diabetic autonomic (poly)neuropathy: Secondary | ICD-10-CM | POA: Insufficient documentation

## 2017-05-13 DIAGNOSIS — Z794 Long term (current) use of insulin: Secondary | ICD-10-CM | POA: Diagnosis not present

## 2017-05-13 DIAGNOSIS — F329 Major depressive disorder, single episode, unspecified: Secondary | ICD-10-CM | POA: Diagnosis not present

## 2017-05-13 DIAGNOSIS — E559 Vitamin D deficiency, unspecified: Secondary | ICD-10-CM | POA: Insufficient documentation

## 2017-05-13 DIAGNOSIS — G4730 Sleep apnea, unspecified: Secondary | ICD-10-CM | POA: Insufficient documentation

## 2017-05-13 DIAGNOSIS — K0889 Other specified disorders of teeth and supporting structures: Secondary | ICD-10-CM | POA: Diagnosis not present

## 2017-05-13 DIAGNOSIS — Z87891 Personal history of nicotine dependence: Secondary | ICD-10-CM | POA: Insufficient documentation

## 2017-05-13 DIAGNOSIS — I110 Hypertensive heart disease with heart failure: Secondary | ICD-10-CM | POA: Insufficient documentation

## 2017-05-13 DIAGNOSIS — E538 Deficiency of other specified B group vitamins: Secondary | ICD-10-CM | POA: Insufficient documentation

## 2017-05-13 DIAGNOSIS — F419 Anxiety disorder, unspecified: Secondary | ICD-10-CM | POA: Insufficient documentation

## 2017-05-13 DIAGNOSIS — E1142 Type 2 diabetes mellitus with diabetic polyneuropathy: Secondary | ICD-10-CM | POA: Diagnosis not present

## 2017-05-13 DIAGNOSIS — E785 Hyperlipidemia, unspecified: Secondary | ICD-10-CM | POA: Insufficient documentation

## 2017-05-13 DIAGNOSIS — K219 Gastro-esophageal reflux disease without esophagitis: Secondary | ICD-10-CM | POA: Insufficient documentation

## 2017-05-13 DIAGNOSIS — I5032 Chronic diastolic (congestive) heart failure: Secondary | ICD-10-CM | POA: Diagnosis not present

## 2017-05-13 DIAGNOSIS — K589 Irritable bowel syndrome without diarrhea: Secondary | ICD-10-CM | POA: Insufficient documentation

## 2017-05-13 DIAGNOSIS — I2510 Atherosclerotic heart disease of native coronary artery without angina pectoris: Secondary | ICD-10-CM | POA: Diagnosis not present

## 2017-05-13 DIAGNOSIS — M797 Fibromyalgia: Secondary | ICD-10-CM | POA: Insufficient documentation

## 2017-05-13 DIAGNOSIS — Z79899 Other long term (current) drug therapy: Secondary | ICD-10-CM | POA: Insufficient documentation

## 2017-05-13 DIAGNOSIS — E119 Type 2 diabetes mellitus without complications: Secondary | ICD-10-CM | POA: Diagnosis not present

## 2017-05-13 HISTORY — PX: TOOTH EXTRACTION: SHX859

## 2017-05-13 LAB — GLUCOSE, CAPILLARY
Glucose-Capillary: 136 mg/dL — ABNORMAL HIGH (ref 65–99)
Glucose-Capillary: 145 mg/dL — ABNORMAL HIGH (ref 65–99)
Glucose-Capillary: 166 mg/dL — ABNORMAL HIGH (ref 65–99)

## 2017-05-13 SURGERY — DENTAL RESTORATION/EXTRACTIONS
Anesthesia: General | Site: Mouth | Laterality: Bilateral

## 2017-05-13 MED ORDER — LIDOCAINE-EPINEPHRINE 2 %-1:100000 IJ SOLN
INTRAMUSCULAR | Status: AC
  Filled 2017-05-13: qty 1

## 2017-05-13 MED ORDER — HEMOSTATIC AGENTS (NO CHARGE) OPTIME
TOPICAL | Status: DC | PRN
  Administered 2017-05-13: 1 via TOPICAL

## 2017-05-13 MED ORDER — 0.9 % SODIUM CHLORIDE (POUR BTL) OPTIME
TOPICAL | Status: DC | PRN
  Administered 2017-05-13: 1000 mL

## 2017-05-13 MED ORDER — ALUM & MAG HYDROXIDE-SIMETH 200-200-20 MG/5ML PO SUSP
30.0000 mL | Freq: Once | ORAL | Status: AC
  Administered 2017-05-13: 30 mL via ORAL
  Filled 2017-05-13: qty 30

## 2017-05-13 MED ORDER — MIDAZOLAM HCL 5 MG/5ML IJ SOLN
INTRAMUSCULAR | Status: DC | PRN
  Administered 2017-05-13: 2 mg via INTRAVENOUS

## 2017-05-13 MED ORDER — DEXAMETHASONE SODIUM PHOSPHATE 10 MG/ML IJ SOLN
INTRAMUSCULAR | Status: DC | PRN
  Administered 2017-05-13: 5 mg via INTRAVENOUS

## 2017-05-13 MED ORDER — HYDROCODONE-ACETAMINOPHEN 5-325 MG PO TABS
1.0000 | ORAL_TABLET | Freq: Once | ORAL | Status: AC
Start: 2017-05-13 — End: 2017-05-13
  Administered 2017-05-13: 1 via ORAL

## 2017-05-13 MED ORDER — PROPOFOL 10 MG/ML IV BOLUS
INTRAVENOUS | Status: AC
  Filled 2017-05-13: qty 20

## 2017-05-13 MED ORDER — FENTANYL CITRATE (PF) 100 MCG/2ML IJ SOLN
INTRAMUSCULAR | Status: AC
  Filled 2017-05-13: qty 2

## 2017-05-13 MED ORDER — CLINDAMYCIN HCL 300 MG PO CAPS: 300.0000 mg | ORAL_CAPSULE | Freq: Three times a day (TID) | ORAL | 0 refills | Status: DC

## 2017-05-13 MED ORDER — FENTANYL CITRATE (PF) 100 MCG/2ML IJ SOLN
25.0000 ug | INTRAMUSCULAR | Status: DC | PRN
Start: 2017-05-13 — End: 2017-05-13
  Administered 2017-05-13 (×2): 50 ug via INTRAVENOUS

## 2017-05-13 MED ORDER — LIDOCAINE-EPINEPHRINE 2 %-1:100000 IJ SOLN
INTRAMUSCULAR | Status: DC | PRN
  Administered 2017-05-13: 15 mL

## 2017-05-13 MED ORDER — FENTANYL CITRATE (PF) 250 MCG/5ML IJ SOLN
INTRAMUSCULAR | Status: AC
  Filled 2017-05-13: qty 5

## 2017-05-13 MED ORDER — ONDANSETRON HCL 4 MG/2ML IJ SOLN
INTRAMUSCULAR | Status: DC | PRN
  Administered 2017-05-13: 4 mg via INTRAVENOUS

## 2017-05-13 MED ORDER — MIDAZOLAM HCL 2 MG/2ML IJ SOLN
INTRAMUSCULAR | Status: AC
  Filled 2017-05-13: qty 2

## 2017-05-13 MED ORDER — HYDROCODONE-ACETAMINOPHEN 5-325 MG PO TABS: 1.0000 | ORAL_TABLET | Freq: Four times a day (QID) | ORAL | 0 refills | Status: DC | PRN

## 2017-05-13 MED ORDER — HYDROCODONE-ACETAMINOPHEN 5-325 MG PO TABS
ORAL_TABLET | ORAL | Status: AC
  Administered 2017-05-13: 1 via ORAL
  Filled 2017-05-13: qty 1

## 2017-05-13 MED ORDER — FENTANYL CITRATE (PF) 100 MCG/2ML IJ SOLN
INTRAMUSCULAR | Status: DC | PRN
Start: 2017-05-13 — End: 2017-05-13
  Administered 2017-05-13: 50 ug via INTRAVENOUS
  Administered 2017-05-13: 100 ug via INTRAVENOUS

## 2017-05-13 MED ORDER — LIDOCAINE HCL (CARDIAC) 20 MG/ML IV SOLN
INTRAVENOUS | Status: DC | PRN
  Administered 2017-05-13: 60 mg via INTRAVENOUS

## 2017-05-13 MED ORDER — OXYMETAZOLINE HCL 0.05 % NA SOLN
NASAL | Status: DC | PRN
  Administered 2017-05-13: 2 via NASAL

## 2017-05-13 MED ORDER — PROPOFOL 10 MG/ML IV BOLUS
INTRAVENOUS | Status: DC | PRN
  Administered 2017-05-13: 110 mg via INTRAVENOUS

## 2017-05-13 MED ORDER — ROCURONIUM BROMIDE 100 MG/10ML IV SOLN
INTRAVENOUS | Status: DC | PRN
  Administered 2017-05-13: 30 mg via INTRAVENOUS

## 2017-05-13 MED ORDER — SUGAMMADEX SODIUM 200 MG/2ML IV SOLN
INTRAVENOUS | Status: DC | PRN
  Administered 2017-05-13: 200 mg via INTRAVENOUS

## 2017-05-13 MED ORDER — SODIUM CHLORIDE 0.9 % IV SOLN
INTRAVENOUS | Status: DC
  Administered 2017-05-13: 10 mL/h via INTRAVENOUS

## 2017-05-13 MED ORDER — SODIUM CHLORIDE 0.9 % IR SOLN
Status: DC | PRN
  Administered 2017-05-13: 1

## 2017-05-13 SURGICAL SUPPLY — 33 items
BLADE SURG 15 STRL LF DISP TIS (BLADE) IMPLANT
BLADE SURG 15 STRL SS (BLADE) ×3
BUR CROSS CUT FISSURE 1.6 (BURR) ×1 IMPLANT
BUR CROSS CUT FISSURE 1.6MM (BURR) ×1
BUR EGG ELITE 4.0 (BURR) ×2 IMPLANT
BUR EGG ELITE 4.0MM (BURR) ×1
CANISTER SUCT 3000ML PPV (MISCELLANEOUS) ×3 IMPLANT
COVER SURGICAL LIGHT HANDLE (MISCELLANEOUS) ×3 IMPLANT
GAUZE PACKING FOLDED 2  STR (GAUZE/BANDAGES/DRESSINGS) ×2
GAUZE PACKING FOLDED 2 STR (GAUZE/BANDAGES/DRESSINGS) ×1 IMPLANT
GLOVE BIO SURGEON STRL SZ 6.5 (GLOVE) ×2 IMPLANT
GLOVE BIO SURGEON STRL SZ7 (GLOVE) IMPLANT
GLOVE BIO SURGEON STRL SZ7.5 (GLOVE) ×3 IMPLANT
GLOVE BIO SURGEONS STRL SZ 6.5 (GLOVE) ×1
GLOVE BIOGEL PI IND STRL 6.5 (GLOVE) IMPLANT
GLOVE BIOGEL PI IND STRL 7.0 (GLOVE) ×1 IMPLANT
GLOVE BIOGEL PI INDICATOR 6.5 (GLOVE)
GLOVE BIOGEL PI INDICATOR 7.0 (GLOVE) ×2
GOWN STRL REUS W/ TWL LRG LVL3 (GOWN DISPOSABLE) ×1 IMPLANT
GOWN STRL REUS W/ TWL XL LVL3 (GOWN DISPOSABLE) ×1 IMPLANT
GOWN STRL REUS W/TWL LRG LVL3 (GOWN DISPOSABLE) ×3
GOWN STRL REUS W/TWL XL LVL3 (GOWN DISPOSABLE) ×3
KIT BASIN OR (CUSTOM PROCEDURE TRAY) ×3 IMPLANT
KIT ROOM TURNOVER OR (KITS) ×3 IMPLANT
NEEDLE 22X1 1/2 (OR ONLY) (NEEDLE) ×6 IMPLANT
NS IRRIG 1000ML POUR BTL (IV SOLUTION) ×3 IMPLANT
PAD ARMBOARD 7.5X6 YLW CONV (MISCELLANEOUS) ×3 IMPLANT
SPONGE SURGIFOAM ABS GEL 12-7 (HEMOSTASIS) ×2 IMPLANT
SUT CHROMIC 3 0 PS 2 (SUTURE) ×4 IMPLANT
TOWEL GREEN STERILE (TOWEL DISPOSABLE) IMPLANT
TRAY ENT MC OR (CUSTOM PROCEDURE TRAY) ×3 IMPLANT
TUBING IRRIGATION (MISCELLANEOUS) ×3 IMPLANT
YANKAUER SUCT BULB TIP NO VENT (SUCTIONS) ×3 IMPLANT

## 2017-05-13 NOTE — H&P (Signed)
Anesthesia H&P Update: History and Physical Exam reviewed; patient is OK for planned anesthetic and procedure. ? ?

## 2017-05-13 NOTE — Anesthesia Postprocedure Evaluation (Signed)
Anesthesia Post Note  Patient: [name]  Procedure(s) Performed: Procedure(s) (LRB): DENTAL RESTORATION/EXTRACTIONS (Bilateral)     Patient location during evaluation: PACU Anesthesia Type: General Level of consciousness: awake and alert Pain management: pain level controlled Vital Signs Assessment: post-procedure vital signs reviewed and stable Respiratory status: spontaneous breathing, nonlabored ventilation and respiratory function stable Cardiovascular status: blood pressure returned to baseline and stable Postop Assessment: no signs of nausea or vomiting Anesthetic complications: no    Last Vitals:  Vitals:   05/13/17 1151 05/13/17 1205  BP: (!) 158/94 136/68  Pulse: 64 67  Resp: 13   Temp: 36.7 C     Last Pain:  Vitals:   05/13/17 1235  TempSrc:   PainSc: 6                  Syed Zukas,W. EDMOND

## 2017-05-13 NOTE — Transfer of Care (Signed)
Immediate Anesthesia Transfer of Care Note  Patient: [name]  Procedure(s) Performed: Procedure(s): DENTAL RESTORATION/EXTRACTIONS (Bilateral)  Patient Location: PACU  Anesthesia Type:General  Level of Consciousness: awake, alert  and oriented  Airway & Oxygen Therapy: Patient Spontanous Breathing and Patient connected to nasal cannula oxygen  Post-op Assessment: Report given to RN and Post -op Vital signs reviewed and stable  Post vital signs: Reviewed and stable  Last Vitals:  Vitals:   05/13/17 0644 05/13/17 0645  BP:  (!) 146/60  Pulse: 61   Resp: 18   Temp: 37 C     Last Pain:  Vitals:   05/13/17 0644  TempSrc: Oral      Patients Stated Pain Goal: 3 (05/13/17 0659)  Complications: No apparent anesthesia complications

## 2017-05-13 NOTE — Anesthesia Procedure Notes (Signed)
Procedure Name: Intubation Date/Time: 05/13/2017 9:09 AM Performed by: Jed LimerickHARDER, Adalaya Irion S Pre-anesthesia Checklist: Patient identified, Emergency Drugs available, Suction available and Patient being monitored Patient Re-evaluated:Patient Re-evaluated prior to induction Oxygen Delivery Method: Circle System Utilized Preoxygenation: Pre-oxygenation with 100% oxygen Induction Type: IV induction Ventilation: Mask ventilation without difficulty Laryngoscope Size: Glidescope and 3 Grade View: Grade I Nasal Tubes: Nasal Rae, Nasal prep performed, Right and Magill forceps- large, utilized Tube size: 7.0 mm Number of attempts: 1 Placement Confirmation: ETT inserted through vocal cords under direct vision,  positive ETCO2 and breath sounds checked- equal and bilateral Tube secured with: Tape Dental Injury: Teeth and Oropharynx as per pre-operative assessment

## 2017-05-13 NOTE — Progress Notes (Signed)
Pt complaining of central chest pain starting at 10:19. Pain non-radiating. Dr. Sampson GoonFitzgerald made aware. Orders for EKG and oxygen nasal cannula ordered and completed.

## 2017-05-13 NOTE — Anesthesia Preprocedure Evaluation (Addendum)
Anesthesia Evaluation  Patient identified by MRN, date of birth, ID band Patient awake    Reviewed: Allergy & Precautions, H&P , NPO status , Patient's Chart, lab work & pertinent test results, reviewed documented beta blocker date and time   Airway Mallampati: II  TM Distance: >3 FB Neck ROM: Full    Dental no notable dental hx. (+) Edentulous Upper, Partial Lower, Dental Advisory Given   Pulmonary sleep apnea , former smoker,    Pulmonary exam normal breath sounds clear to auscultation       Cardiovascular hypertension, Pt. on medications and Pt. on home beta blockers + CAD and +CHF   Rhythm:Regular Rate:Normal     Neuro/Psych  Headaches, PSYCHIATRIC DISORDERS Anxiety Depression Schizophrenia    GI/Hepatic Neg liver ROS, hiatal hernia, Medicated and Controlled,  Endo/Other  diabetes, Insulin Dependent  Renal/GU Renal InsufficiencyRenal disease  negative genitourinary   Musculoskeletal  (+) Arthritis , Osteoarthritis,  Fibromyalgia -  Abdominal   Peds  Hematology negative hematology ROS (+) anemia ,   Anesthesia Other Findings   Reproductive/Obstetrics negative OB ROS                            Anesthesia Physical Anesthesia Plan  ASA: III  Anesthesia Plan: General   Post-op Pain Management:    Induction: Intravenous  PONV Risk Score and Plan: 4 or greater and Ondansetron, Dexamethasone and Midazolam  Airway Management Planned: Nasal ETT and Video Laryngoscope Planned  Additional Equipment:   Intra-op Plan:   Post-operative Plan: Extubation in OR  Informed Consent: I have reviewed the patients History and Physical, chart, labs and discussed the procedure including the risks, benefits and alternatives for the proposed anesthesia with the patient or authorized representative who has indicated his/her understanding and acceptance.   Dental advisory given  Plan Discussed with:  CRNA  Anesthesia Plan Comments:         Anesthesia Quick Evaluation

## 2017-05-13 NOTE — Progress Notes (Addendum)
Patient received Maalox PO. Patient denies any chest pain at this time. Patient states her "chest feels normal." Dr. Sampson GoonFitzgerald made aware. Patient moving to phase 2 in PACU.

## 2017-05-13 NOTE — Op Note (Signed)
05/13/2017  9:36 AM  PATIENT:  [name]  66 y.o. female  PRE-OPERATIVE DIAGNOSIS:  NON RESTORABLE TEETH # 22, 23, 24, 25, 26, 27, BILATERAL MANDIBULAR LINGUAL TORI  POST-OPERATIVE DIAGNOSIS:  SAME  PROCEDURE:  Procedure(s): EXTRACTION TEETH # 22, 23, 24, 25,26, 27, ALVEOLOPLASTY, REMOVAL BILATERAL MANDIBULAR LINGUAL TORI  SURGEON:  Surgeon(s): Ocie DoyneJensen, Berline Semrad, DDS  ANESTHESIA:   local and general  EBL:  minimal  DRAINS: none   SPECIMEN:  No Specimen  COUNTS:  YES  PLAN OF CARE: Discharge to home after PACU  PATIENT DISPOSITION:  PACU - hemodynamically stable.   PROCEDURE DETAILS: Dictation # [id number]  Georgia LopesScott M. Lynde Ludwig, DMD 05/13/2017 9:36 AM

## 2017-05-13 NOTE — H&P (Signed)
H&P documentation  -History and Physical Reviewed  -Patient has been re-examined  -No change in the plan of care  Venie Montesinos M  

## 2017-05-13 NOTE — Op Note (Signed)
NAME:  Dyke MaesERRY, Shanique                      ACCOUNT NO.:  MEDICAL RECORD NO.:  [id number]  LOCATION:                                 FACILITY:  PHYSICIAN:  Georgia LopesScott M. Princella Jaskiewicz, M.D.  DATE OF BIRTH:  1951/05/14  DATE OF PROCEDURE:  05/13/2017 DATE OF DISCHARGE:                              OPERATIVE REPORT   PREOPERATIVE DIAGNOSES:  Nonrestorable teeth secondary to periodontal disease, numbers 22, 23, 24, 25, 26, 27, bilateral mandibular lingual tori.  POSTOPERATIVE DIAGNOSES:  Nonrestorable teeth secondary to periodontal disease, numbers 22, 23, 24, 25, 26, 27, bilateral mandibular lingual tori.  PROCEDURE:  Extraction of teeth numbers 22, 23, 24, 25, 26, 27, alveoplasty right and left mandible, removal of bilateral mandibular lingual tori.  SURGEON:  Georgia LopesScott M. Darlin Stenseth, M.D.  ANESTHESIA:  General, nasal intubation.  DESCRIPTION OF PROCEDURE:  The patient was taken to the operating room, placed on the table in supine position.  General anesthesia was administered intravenously and nasal endotracheal tube was placed and secured.  The eyes were protected and the patient was draped for the procedure.  Time-out was performed.  The posterior pharynx was suctioned.  A throat pack was placed.  A 2% lidocaine with 1:100,000 epinephrine was infiltrated in an inferior alveolar block on the right and left sides and buccal infiltration to the anterior mandible.  A bite block was placed in the mouth and a Sweetheart retractor was used to retract the tongue and a #15 blade was used to make an incision along the crest of the mandibular ridge, beginning in the area of approximately the second molar, carrying forward to tooth #22.  Then, the incision was carried in the buccal sulcus and lingual sulcus until tooth #27 was encountered.  The periosteum was reflected and the teeth were elevated with a 301 elevator and removed from the mouth with the Asch forceps.  Then, the sockets were curetted.  The  periosteum was further reflected to expose the alveolar crest as well as the lingual tori on the left side.  Then, the egg-shaped burr was used to perform alveoplasty of the left anterior mandible and using the Seldin retractor, the lingual torus was removed using the egg-shaped burr and a bone file was then used to further smooth these areas and then the areas were closed with 3-0 chromic.  The bite block and Sweetheart retractor were repositioned at the other side of the mouth and a 15 blade was used to make a similar incision along the crest of the right mandibular ridge and then it was joined to the previous incision.  Then, the periosteum was reflected to expose the alveolar crest and the lingual torus.  Then, the egg-shaped bur was used to remove the torus and perform alveoplasty of the right mandible.  Then, the bone file was used to further smooth these areas.  Then, the areas were irrigated and closed with 3-0 chromic.  The oral cavity was inspected and found to have good contour, hemostasis, and closure.  The oral cavity was irrigated and suctioned. The throat pack was removed.  The patient was left in care of Anesthesia for awakening and  extubation and transportation to recovery room and discharged home through Day surgery.  ESTIMATED BLOOD LOSS:  Minimal.  COMPLICATIONS:  None.  SPECIMENS:  None.     [name], M.D.     SMJ/MEDQ  D:  05/13/2017  T:  05/13/2017  Job:  [id number]

## 2017-05-13 NOTE — Addendum Note (Signed)
Addendum  created 05/13/17 1316 by Gaynelle AduFitzgerald, Bell Cai, MD   Sign clinical note

## 2017-05-14 ENCOUNTER — Encounter (HOSPITAL_COMMUNITY): Payer: Self-pay | Admitting: Oral Surgery

## 2017-05-21 ENCOUNTER — Other Ambulatory Visit: Payer: Self-pay | Admitting: Family Medicine

## 2017-05-23 NOTE — Telephone Encounter (Signed)
Refill done.  

## 2017-05-26 ENCOUNTER — Ambulatory Visit: Payer: Medicare Other | Attending: Family Medicine | Admitting: Rehabilitative and Restorative Service Providers"

## 2017-05-26 DIAGNOSIS — Z961 Presence of intraocular lens: Secondary | ICD-10-CM | POA: Diagnosis not present

## 2017-05-26 DIAGNOSIS — R293 Abnormal posture: Secondary | ICD-10-CM | POA: Insufficient documentation

## 2017-05-26 DIAGNOSIS — M542 Cervicalgia: Secondary | ICD-10-CM | POA: Diagnosis not present

## 2017-05-26 DIAGNOSIS — E119 Type 2 diabetes mellitus without complications: Secondary | ICD-10-CM | POA: Diagnosis not present

## 2017-05-26 LAB — HM DIABETES EYE EXAM

## 2017-05-26 NOTE — Patient Instructions (Signed)
Thoracic Self-Mobilization (Supine)    With rolled towel placed lengthwise at lower ribs level, lie back on towel with arms outstretched. Hold _2 minutes. Relax. Repeat __1-2__ times per day as needed for postural stretching.  http://orth.exer.us/1001   Copyright  VHI. All rights reserved.   Healthy Back - Shoulder Roll    Stand straight with arms relaxed at sides. Roll shoulders backward continuously. Do __10__ times.  Can repeat throughout the day, as tolerated.  Copyright  VHI. All rights reserved.

## 2017-05-27 NOTE — Therapy (Signed)
[facility] [address] [place], [place], [address] Phone: [phone]   Fax:  [phone]  Physical Therapy Evaluation  Patient Details  Name: [name] MRN: [id number] Date of Birth: 03/24/1951 Referring Provider: Patrcia Dolly, MD  Encounter Date: 05/26/2017      PT End of Session - 05/27/17 0853    Visit Number 1   Number of Visits 8   Date for PT Re-Evaluation 06/26/17   Authorization Type [employer] MCR   PT Start Time 1110   PT Stop Time 1150   PT Time Calculation (min) 40 min   Activity Tolerance Patient tolerated treatment well   Behavior During Therapy [facility] for tasks assessed/performed      Past Medical History:  Diagnosis Date  . Anemia   . Arthritis   . Borderline personality disorder   . CAD (coronary artery disease)    Catherization 11/18/09>nonobstructive CAD , normal LV function, EF 65%  . Cervicalgia   . Chronic kidney disease (CKD), stage IV (severe) (HCC)   . Dental caries    periodontitis and mandibular tori  . Depression   . Diabetes mellitus   . Diabetes mellitus, type II (HCC)   . Diabetic gastroparesis (HCC)   . Diastolic CHF (HCC)    Grade I, on Echo 06/2013, EF 55-60%  . Dyspnea   . Fibromyalgia   . GERD (gastroesophageal reflux disease)   . Gout   . Headache(784.0)   . Health maintenance examination    Colonoscopy 2009> normal; Diabetic eye exam 12/2008. No diabetic retinopathy; Mammogram 11/10> No evidence of malignancy, DXA 03/14/13 : normal  . History of hiatal hernia   . Hyperlipidemia   . Hypertension   . IBS (irritable bowel syndrome)   . Iliotibial band syndrome   . Low back pain syndrome   . Nonorganic psychosis   . Peripheral neuropathy   . Right carpal tunnel syndrome 09/13/2014  . SBO (small bowel obstruction) (HCC) 01/09/2013  . Sleep apnea    does not wear CPAP   . Suicide attempt [facility])    " by overdose of whatever I had"  . Vitamin B12 deficiency   . Vitamin D  deficiency   . Wears dentures    upper  . Wears glasses   . Wears glasses     Past Surgical History:  Procedure Laterality Date  . ABDOMINAL HYSTERECTOMY    . APPENDECTOMY    . BOWEL RESECTION N/A 01/10/2013   Procedure: SMALL BOWEL RESECTION;  Surgeon: Lodema Pilot, DO;  Location: [facility] OR;  Service: General;  Laterality: N/A;  . CARPAL TUNNEL RELEASE Right 09/13/2014   Procedure: RIGHT WRIST CARPAL TUNNEL RELEASE;  Surgeon: Eulas Post, MD;  Location: [facility];  Service: Orthopedics;  Laterality: Right;  . CARPAL TUNNEL RELEASE Left 02/28/2015   Procedure: LEFT CARPAL TUNNEL RELEASE;  Surgeon: Teryl Lucy, MD;  Location: [facility];  Service: Orthopedics;  Laterality: Left;  . CATARACT EXTRACTION W/ INTRAOCULAR LENS  IMPLANT, BILATERAL    . COLONOSCOPY    . HAND SURGERY  1992   rt  . HERNIA REPAIR  03/01/14   Lap incisional hernia repair w/mesh  . INCISIONAL HERNIA REPAIR N/A 03/01/2014   Procedure: LAPAROSCOPIC INCISIONAL HERNIA;  Surgeon: Axel Filler, MD;  Location: [facility];  Service: General;  Laterality: N/A;  . INSERTION OF MESH N/A 03/01/2014   Procedure: INSERTION OF MESH;  Surgeon: Axel Filler, MD;  Location: [facility];  Service:  General;  Laterality: N/A;  . LAPAROTOMY N/A 01/10/2013   Procedure: EXPLORATORY LAPAROTOMY;  Surgeon: Lodema Pilot, DO;  Location: [facility];  Service: General;  Laterality: N/A;  . LYSIS OF ADHESION N/A 01/10/2013   Procedure: LYSIS OF ADHESION;  Surgeon: Lodema Pilot, DO;  Location: [facility];  Service: General;  Laterality: N/A;  . MULTIPLE TOOTH EXTRACTIONS    . STERIOD INJECTION Left 09/13/2014   Procedure: STEROID INJECTION LEFT HAND;  Surgeon: Eulas Post, MD;  Location: [facility];  Service: Orthopedics;  Laterality: Left;  . TOOTH EXTRACTION Bilateral 05/13/2017   Procedure: DENTAL RESTORATION/EXTRACTIONS;  Surgeon: Ocie Doyne, DDS;  Location[facility];  Service: Oral Surgery;  Laterality:  Bilateral;    There were no vitals filed for this visit.       Subjective Assessment - 05/26/17 1112    Subjective The patient reports h/o headaches that worsened 3-6 months ago.  She notes throbbing sensations in her suboccipital region, tightness in the muscles.  She denies sensitivity to light "I just naturally close my eyes to lay down to ease it up."    The patient notes daily headaches and reports this limits all of her activities including difficulty sitting up to watch television, she reports having to rest during ADLs due to back/neck pain.       Pertinent History h/o cateract surgery, chronic back pain, diabetes, h/o hip pain/bursitis   Patient Stated Goals She wants to ease pain.      Currently in Pain? Yes   Pain Score 4   "it's on the rise"   Pain Location Neck   Pain Orientation Upper   Pain Descriptors / Indicators Aching;Tightness;Throbbing   Pain Type Chronic pain   Pain Onset More than a month ago   Pain Frequency Constant   Aggravating Factors  driving, reading, watching television "the more I go, the worse it gets"   Pain Relieving Factors lying down            [facility] PT Assessment - 05/26/17 1120      Assessment   Medical Diagnosis cervicogenic headache   Referring Provider Patrcia Dolly, MD   Onset Date/Surgical Date --  3-6 months worsening symptoms   Prior Therapy had therapy for back in the past     Precautions   Precautions None     Restrictions   Weight Bearing Restrictions No     Balance Screen   Has the patient fallen in the past 6 months Yes   How many times? 2--bumped her head   Has the patient had a decrease in activity level because of a fear of falling?  Yes  due to neck pain   Is the patient reluctant to leave their home because of a fear of falling?  Yes  gets anxiety when out by herself     Home Environment   Living Environment Private residence   Living Arrangements Spouse/significant other   Type of Home House   Home Access  Stairs to enter   Entrance Stairs-Number of Steps 2   Entrance Stairs-Rails Can reach both   Home Layout One level     Prior Function   Level of Independence Independent   Leisure does things when she feels like she can.     Observation/Other Assessments   Focus on Therapeutic Outcomes (FOTO)  47%   Other Surveys  Other Surveys   Neck Disability Index  54%     Sensation   Light Touch  Appears Intact     Posture/Postural Control   Posture/Postural Control Postural limitations   Postural Limitations Rounded Shoulders;Forward head     ROM / Strength   AROM / PROM / Strength AROM;Strength     AROM   AROM Assessment Site Cervical;Shoulder   Right/Left Shoulder Left;Right  WFLs for all shoulder AROM   Cervical Flexion 45   Cervical Extension 22   Cervical - Right Side Bend 42   Cervical - Left Side Bend 42   Cervical - Right Rotation 55   Cervical - Left Rotation 55     Strength   Overall Strength Comments Bilateral shoulder flexion/abduction 5/5, bilateral elbow flexion/extension is 5/5.     Palpation   Palpation comment Significant tenderness noted to palpation over suboccipital musculature.       Special Tests    Special Tests Cervical   Cervical Tests Dictraction;other     other    Comment cervical flexion rotation test 22 deg left and 38 deg right *significantly limited with L rotation.             Objective measurements completed on examination: See above findings.                  PT Education - 05/27/17 0854    Education provided Yes   Education Details HEP:  thoracic self mobilization for posture, shoulder circles   Person(s) Educated Patient   Methods Explanation;Demonstration;Handout   Comprehension Verbalized understanding;Returned demonstration          PT Short Term Goals - 03/17/17 1009      PT SHORT TERM GOAL #1   Title She will be independent with inital HEP   Baseline does then 1x/week   Status On-going     PT SHORT  TERM GOAL #2   Title she will report pain decreased 25% or more   Baseline No pain decrease   Status On-going     PT SHORT TERM GOAL #3   Title she will improve lumbar flexion to 50 degrees to demo increased flexibility   Baseline 50 degrees today   Status Achieved           PT Long Term Goals - 05/27/17 0858      PT LONG TERM GOAL #1   Title The patient will reduce neck disability index score from 54% to < or equal to 40% to demo dec'd self perception of disability.   Baseline Patient scores 54%   Time 4   Period Weeks   Target Date 06/26/17     PT LONG TERM GOAL #2   Title The patient will return demo HEP for postural stabilization, neck flexibility, and general muscle relaxation.   Baseline Patient not doing HEP for posture.   Time 4   Period Weeks   Target Date 06/26/17     PT LONG TERM GOAL #3   Title The patient will improve neck flexion + rotation (as in cervical flexion rotation test) to the left side from 22 deg up to 35 deg to demo improved joint mobility at upper cervical spine.   Baseline 22 degrees L rotation and 38 degrees R rotation with end range cervical flexion (blocked to the left).   Time 4   Period Weeks   Target Date 06/26/17     PT LONG TERM GOAL #4   Title The patient will return demo improved postural alignment to decrease suboccipital shortening leading to increased spasm + headache.   Baseline The  patient has significantly rounded shoulders + forward head , which is shortening suboccipitals and contributing to pain.   Time 4   Period Weeks   Target Date 06/26/17     PT LONG TERM GOAL #5   Title The patient will report reduced frequency of headaches to < or equal to 3x/week.   Baseline Daily headaches that are constant in nature.   Time 4   Period Weeks   Target Date 06/26/17                Plan - June 12, 2017 1610    Clinical Impression Statement The patient is a 66 year old female presenting with h/o chronic neck pain and  headaches that have worsened in intensity and frequency over the past 3-6 months.  At today's evaluation, she has postural abnormalities leading to shortening of suboccipital musculature, thoracic rounding and diminished upper cervical spine rotation to the left side.  PT to address deficits and promote improved participation in daily activities.   History and Personal Factors relevant to plan of care: limited in ADLs, daily headaches, h/o chronic pain, obesity   Clinical Presentation Evolving   Clinical Presentation due to: due to nature of worsening headaches in frequency and intensity now impacting daily activities.   Clinical Decision Making Moderate  due to addressing postural shortening, joint limitations, postural weakness, and noting h/o significant chronic back and neck pain.   Rehab Potential Good   Clinical Impairments Affecting Rehab Potential h/o chronic pain   PT Frequency 2x / week   PT Duration 4 weeks  + evaluation   PT Treatment/Interventions ADLs/Self Care Home Management;Manual techniques;Neuromuscular re-education;Therapeutic activities;Therapeutic exercise;Moist Heat;Electrical Stimulation;Patient/family education;Cryotherapy;Passive range of motion;Dry needling   PT Next Visit Plan check HEP, thoracic spine mobilization, upper cervical spine mobilization (to improve L rotation), posture re-ed, suboccipital lengthening within tolerance.   Consulted and Agree with Plan of Care Patient      Patient will benefit from skilled therapeutic intervention in order to improve the following deficits and impairments:  Decreased activity tolerance, Decreased range of motion, Increased fascial restricitons, Increased muscle spasms, Pain, Improper body mechanics, Impaired flexibility, Postural dysfunction, Decreased strength, Hypomobility  Visit Diagnosis: Neck pain  Abnormal posture      G-Codes - 2017/06/12 0904    Functional Assessment Tool Used (Outpatient Only) FOTO neck  disability index=54%.   Functional Limitation Mobility: Walking and moving around   Mobility: Walking and Moving Around Current Status [phone]) At least 40 percent but less than 60 percent impaired, limited or restricted   Mobility: Walking and Moving Around Goal Status [phone]) At least 20 percent but less than 40 percent impaired, limited or restricted       Problem List Patient Active Problem List   Diagnosis Date Noted  . New onset of headaches after age 66 04/11/2017  . Vaginal dryness 01/12/2017  . Arthritis of sacroiliac joint (HCC) 12/06/2016  . Posterior pain of hip, left 11/25/2016  . Ischial pain 06/21/2016  . Ischial bursitis 06/09/2016  . Vaginal discharge 05/05/2016  . Hepatic steatosis 05/03/2016  . History of Helicobacter pylori infection 05/03/2016  . Diarrhea 02/10/2016  . Nausea 02/10/2016  . Hyperkalemia 01/27/2016  . Diastolic CHF (HCC) 01/26/2016  . At high risk for falls 12/25/2015  . Secondary hyperparathyroidism of renal origin (HCC) 08/08/2015  . CKD (chronic kidney disease) stage 3, GFR 30-59 ml/min 08/05/2015  . Preventative health care 06/18/2015  . Lateral epicondylitis of right elbow 04/17/2015  . Degenerative lumbar disc  12/30/2014  . Cough   . Degenerative joint disease involving multiple joints on both sides of body (right foot pain) 08/14/2014  . Chronic low back pain 04/15/2014  . Fibromyalgia syndrome 06/18/2013  . Abdominal pain, epigastric 03/29/2013  . Myalgia 12/21/2012  . Chronic daily headache 05/24/2012  . Depression 07/14/2011  . Anxiety 06/02/2011  . Lumbar spondylosis with myelopathy 11/20/2010  . Coronary atherosclerosis 03/10/2010  . Dyslipidemia 12/11/2008  . Vitamin D deficiency 11/26/2008  . Controlled type 2 diabetes mellitus with insulin therapy (HCC) 05/25/2007  . PERIPHERAL NEUROPATHY 05/25/2007  . Essential hypertension 05/25/2007  . GERD 05/25/2007  . SYMPTOM, APNEA, SLEEP NOS 05/25/2007    Cruz Devilla ,  PT 05/27/2017, 9:12 AM  [facility] [address] [place], [place], [address] Phone: [phone]   Fax:  [phone]  Name: [name] MRN: [id number] Date of Birth: 14-May-1951

## 2017-05-30 ENCOUNTER — Ambulatory Visit: Payer: Medicare Other | Admitting: Rehabilitation

## 2017-06-02 ENCOUNTER — Ambulatory Visit: Payer: Medicare Other | Admitting: Rehabilitative and Restorative Service Providers"

## 2017-06-02 DIAGNOSIS — R293 Abnormal posture: Secondary | ICD-10-CM

## 2017-06-02 DIAGNOSIS — M542 Cervicalgia: Secondary | ICD-10-CM | POA: Diagnosis not present

## 2017-06-02 NOTE — Therapy (Signed)
[facility] [address] [place], [place], [address] Phone: [phone]   Fax:  [phone]  Physical Therapy Treatment  Patient Details  Name: [name] MRN: [id number] Date of Birth: 23-Dec-1950 Referring Provider: Patrcia Dolly, MD  Encounter Date: 06/02/2017      PT End of Session - 06/02/17 1237    Visit Number 2   Number of Visits 8   Date for PT Re-Evaluation 06/26/17   Authorization Type [employer] MCR   PT Start Time 1103   PT Stop Time 1145   PT Time Calculation (min) 42 min   Activity Tolerance Patient tolerated treatment well   Behavior During Therapy [facility] for tasks assessed/performed      Past Medical History:  Diagnosis Date  . Anemia   . Arthritis   . Borderline personality disorder   . CAD (coronary artery disease)    Catherization 11/18/09>nonobstructive CAD , normal LV function, EF 65%  . Cervicalgia   . Chronic kidney disease (CKD), stage IV (severe) (HCC)   . Dental caries    periodontitis and mandibular tori  . Depression   . Diabetes mellitus   . Diabetes mellitus, type II (HCC)   . Diabetic gastroparesis (HCC)   . Diastolic CHF (HCC)    Grade I, on Echo 06/2013, EF 55-60%  . Dyspnea   . Fibromyalgia   . GERD (gastroesophageal reflux disease)   . Gout   . Headache(784.0)   . Health maintenance examination    Colonoscopy 2009> normal; Diabetic eye exam 12/2008. No diabetic retinopathy; Mammogram 11/10> No evidence of malignancy, DXA 03/14/13 : normal  . History of hiatal hernia   . Hyperlipidemia   . Hypertension   . IBS (irritable bowel syndrome)   . Iliotibial band syndrome   . Low back pain syndrome   . Nonorganic psychosis   . Peripheral neuropathy   . Right carpal tunnel syndrome 09/13/2014  . SBO (small bowel obstruction) (HCC) 01/09/2013  . Sleep apnea    does not wear CPAP   . Suicide attempt [facility])    " by overdose of whatever I had"  . Vitamin B12 deficiency   . Vitamin D  deficiency   . Wears dentures    upper  . Wears glasses   . Wears glasses     Past Surgical History:  Procedure Laterality Date  . ABDOMINAL HYSTERECTOMY    . APPENDECTOMY    . BOWEL RESECTION N/A 01/10/2013   Procedure: SMALL BOWEL RESECTION;  Surgeon: Lodema Pilot, DO;  Location: [facility] OR;  Service: General;  Laterality: N/A;  . CARPAL TUNNEL RELEASE Right 09/13/2014   Procedure: RIGHT WRIST CARPAL TUNNEL RELEASE;  Surgeon: Eulas Post, MD;  Location: [facility];  Service: Orthopedics;  Laterality: Right;  . CARPAL TUNNEL RELEASE Left 02/28/2015   Procedure: LEFT CARPAL TUNNEL RELEASE;  Surgeon: Teryl Lucy, MD;  Location: [facility];  Service: Orthopedics;  Laterality: Left;  . CATARACT EXTRACTION W/ INTRAOCULAR LENS  IMPLANT, BILATERAL    . COLONOSCOPY    . HAND SURGERY  1992   rt  . HERNIA REPAIR  03/01/14   Lap incisional hernia repair w/mesh  . INCISIONAL HERNIA REPAIR N/A 03/01/2014   Procedure: LAPAROSCOPIC INCISIONAL HERNIA;  Surgeon: Axel Filler, MD;  Location: [facility]S;  Service: General;  Laterality: N/A;  . INSERTION OF MESH N/A 03/01/2014   Procedure: INSERTION OF MESH;  Surgeon: Axel Filler, MD;  Location: [facility];  Service:  General;  Laterality: N/A;  . LAPAROTOMY N/A 01/10/2013   Procedure: EXPLORATORY LAPAROTOMY;  Surgeon: Lodema Pilot, DO;  Location: [facility] OR;  Service: General;  Laterality: N/A;  . LYSIS OF ADHESION N/A 01/10/2013   Procedure: LYSIS OF ADHESION;  Surgeon: Lodema Pilot, DO;  Location: [facility] OR;  Service: General;  Laterality: N/A;  . MULTIPLE TOOTH EXTRACTIONS    . STERIOD INJECTION Left 09/13/2014   Procedure: STEROID INJECTION LEFT HAND;  Surgeon: Eulas Post, MD;  Location: [facility];  Service: Orthopedics;  Laterality: Left;  . TOOTH EXTRACTION Bilateral 05/13/2017   Procedure: DENTAL RESTORATION/EXTRACTIONS;  Surgeon: Ocie Doyne, DDS;  Location: [facility] OR;  Service: Oral Surgery;  Laterality:  Bilateral;    There were no vitals filed for this visit.      Subjective Assessment - 06/02/17 1107    Subjective The patient notes frequent nausea with head swimminess.  She notes that she wakes without headache and it develops as she gets up.  "My head is starting to hurt."  Patient notes sensitivity to head/light that affects either her head or her stomach.   Pertinent History h/o cateract surgery, chronic back pain, diabetes, h/o hip pain/bursitis   Patient Stated Goals She wants to ease pain.      Currently in Pain? Yes   Pain Score 4    Pain Location Head   Pain Orientation Lower   Pain Descriptors / Indicators Throbbing   Pain Type Chronic pain   Pain Onset More than a month ago   Pain Frequency Intermittent   Aggravating Factors  "it is throbbing, it hasn't gotten tight yet"   Pain Relieving Factors lying down                         [facility] Adult PT Treatment/Exercise - 06/02/17 1426      Exercises   Exercises Neck;Shoulder     Neck Exercises: Seated   Other Seated Exercise Shoulder cirlces x 10 reps.     Neck Exercises: Supine   Neck Retraction 10 reps;5 secs   Neck Retraction Limitations with tactile cues and verbal cues for chin tuck/positioning.   Other Supine Exercise Supine thoracic extension with towel roll and overpressure into shoulder stretch "v"     Neck Exercises: Prone   Other Prone Exercise Scapular retraction x 10 reps with lifting UEs and reaching towards feet.     Shoulder Exercises: Standing   Other Standing Exercises Overhead reach with trunk leaning for lateral/parascapular stretching right and left sides.      Manual Therapy   Manual Therapy Joint mobilization;Soft tissue mobilization;Manual Traction;Passive ROM;Muscle Energy Technique;Myofascial release   Manual therapy comments Patient has increased irritability around soft tissue of bilateral scapulae and suboccipital region   Joint Mobilization PRONE:  Posterior>Anterior  upper ot mid thoracic mobilization Grade III-IV with sustained pressure at end range.  SUPINE:  mid to lower cervical upglides R and L side with overpressure into rotation.  Cervical posterior to anterior Grade II for gentle mobility and pain relief.   Soft tissue mobilization Parascapular mobilization in sidelying performing right and left sides doing gentle PROM with attempted pressure for trigger point relief, however high irritability.  Suboccipital release and bilateral scalene/upper trap soft tissue mobilization   Myofascial Release suboccipital release   Passive ROM overpressure into cervical flexion and rotation   Manual Traction gentle in supine with flexion for comfort   Muscle Energy Technique contract/relax using chin tucks.  PT Education - 06/02/17 1236    Education provided Yes   Education Details HEP: added prone scapular retraction and chin tuck supine to current HEP   Person(s) Educated Patient   Methods Explanation;Demonstration;Handout   Comprehension Verbalized understanding;Returned demonstration          PT Short Term Goals - 06/02/17 1237      PT SHORT TERM GOAL #1   Title See Long Term goals           PT Long Term Goals - 05/27/17 0858      PT LONG TERM GOAL #1   Title The patient will reduce neck disability index score from 54% to < or equal to 40% to demo dec'd self perception of disability.   Baseline Patient scores 54%   Time 4   Period Weeks   Target Date 06/26/17     PT LONG TERM GOAL #2   Title The patient will return demo HEP for postural stabilization, neck flexibility, and general muscle relaxation.   Baseline Patient not doing HEP for posture.   Time 4   Period Weeks   Target Date 06/26/17     PT LONG TERM GOAL #3   Title The patient will improve neck flexion + rotation (as in cervical flexion rotation test) to the left side from 22 deg up to 35 deg to demo improved joint mobility at upper cervical spine.    Baseline 22 degrees L rotation and 38 degrees R rotation with end range cervical flexion (blocked to the left).   Time 4   Period Weeks   Target Date 06/26/17     PT LONG TERM GOAL #4   Title The patient will return demo improved postural alignment to decrease suboccipital shortening leading to increased spasm + headache.   Baseline The patient has significantly rounded shoulders + forward head , which is shortening suboccipitals and contributing to pain.   Time 4   Period Weeks   Target Date 06/26/17     PT LONG TERM GOAL #5   Title The patient will report reduced frequency of headaches to < or equal to 3x/week.   Baseline Daily headaches that are constant in nature.   Time 4   Period Weeks   Target Date 06/26/17               Plan - 06/02/17 1238    Clinical Impression Statement The patient notes increased discomfort in upper thoracic spine with today's session.  PT recommended she provide feedback to us at next session if our session today was too intense.  PT emphasizing increasing spinal mobility, increasing muscle flexibility, aligning posture, and stabilizing spine through muscle strengthening.   PT Treatment/Interventions ADLs/Self Care Home Management;Manual techniques;Neuromuscular re-education;Therapeutic activities;Therapeutic exercise;Moist Heat;Electrical Stimulation;Patient/family education;Cryotherapy;Passive range of motion;Dry needling   PT Next Visit Plan check HEP, thoracic spine mobilization, upper cervical spine mobilization (to improve L rotation), posture re-ed, suboccipital lengthening within tolerance.   Consulted and Agree with Plan of Care Patient      Patient will benefit from skilled therapeutic intervention in order to improve the following deficits and impairments:  Decreased activity tolerance, Decreased range of motion, Increased fascial restricitons, Increased muscle spasms, Pain, Improper body mechanics, Impaired flexibility, Postural  dysfunction, Decreased strength, Hypomobility  Visit Diagnosis: Neck pain  Abnormal posture     Problem List Patient Active Problem List   Diagnosis Date Noted  . New onset of headaches after age 66 04/11/2017  . Vaginal dryness 01/12/2017  .  Arthritis of sacroiliac joint (HCC) 12/06/2016  . Posterior pain of hip, left 11/25/2016  . Ischial pain 06/21/2016  . Ischial bursitis 06/09/2016  . Vaginal discharge 05/05/2016  . Hepatic steatosis 05/03/2016  . History of Helicobacter pylori infection 05/03/2016  . Diarrhea 02/10/2016  . Nausea 02/10/2016  . Hyperkalemia 01/27/2016  . Diastolic CHF (HCC) 01/26/2016  . At high risk for falls 12/25/2015  . Secondary hyperparathyroidism of renal origin (HCC) 08/08/2015  . CKD (chronic kidney disease) stage 3, GFR 30-59 ml/min 08/05/2015  . Preventative health care 06/18/2015  . Lateral epicondylitis of right elbow 04/17/2015  . Degenerative lumbar disc 12/30/2014  . Cough   . Degenerative joint disease involving multiple joints on both sides of body (right foot pain) 08/14/2014  . Chronic low back pain 04/15/2014  . Fibromyalgia syndrome 06/18/2013  . Abdominal pain, epigastric 03/29/2013  . Myalgia 12/21/2012  . Chronic daily headache 05/24/2012  . Depression 07/14/2011  . Anxiety 06/02/2011  . Lumbar spondylosis with myelopathy 11/20/2010  . Coronary atherosclerosis 03/10/2010  . Dyslipidemia 12/11/2008  . Vitamin D deficiency 11/26/2008  . Controlled type 2 diabetes mellitus with insulin therapy (HCC) 05/25/2007  . PERIPHERAL NEUROPATHY 05/25/2007  . Essential hypertension 05/25/2007  . GERD 05/25/2007  . SYMPTOM, APNEA, SLEEP NOS 05/25/2007    Pami Wool, PT 06/02/2017, 2:34 PM  [facility] [address] [place], [place], [address] Phone: [phone]   Fax:  [phone]  Name: [name] MRN: [id number] Date of Birth: 24-Dec-1950

## 2017-06-02 NOTE — Patient Instructions (Signed)
Thoracic Self-Mobilization (Supine)    With rolled towel placed lengthwise at lower ribs level, lie back on towel with arms outstretched. Hold _2 minutes. Relax. Repeat __1-2__ times per day as needed for postural stretching.  http://orth.exer.us/1001   Copyright  VHI. All rights reserved.   Healthy Back - Shoulder Roll    Stand straight with arms relaxed at sides. Roll shoulders backward continuously. Do __10__ times.  Can repeat throughout the day, as tolerated.  Copyright  VHI. All rights reserved.   Extensors, Supine    Lie supine, head on small, rolled towel or one pillow.  Gently tuck chin and bring toward chest. Hold __5_ seconds. Repeat __10_ times per session. Do _2__ sessions per day.  Copyright  VHI. All rights reserved.   Scapular Retraction (Prone)    Lie with arms at sides. Pinch shoulder blades together and raise arms a few inches from floor and REACH TOWARDS YOUR FEET (will lift shoulders gently). Repeat __5-10__ times per set. Do __2__ sessions per day.  http://orth.exer.us/955   Copyright  VHI. All rights reserved.

## 2017-06-03 ENCOUNTER — Ambulatory Visit: Payer: Self-pay | Admitting: Rehabilitation

## 2017-06-07 ENCOUNTER — Encounter: Payer: Self-pay | Admitting: *Deleted

## 2017-06-09 ENCOUNTER — Ambulatory Visit: Payer: Medicare Other | Admitting: Physical Therapy

## 2017-06-13 ENCOUNTER — Ambulatory Visit: Payer: Medicare Other | Admitting: Rehabilitation

## 2017-06-13 ENCOUNTER — Ambulatory Visit (HOSPITAL_COMMUNITY): Payer: Medicare Other | Attending: Family Medicine

## 2017-06-13 ENCOUNTER — Ambulatory Visit (INDEPENDENT_AMBULATORY_CARE_PROVIDER_SITE_OTHER): Payer: Medicare Other | Admitting: Internal Medicine

## 2017-06-13 ENCOUNTER — Telehealth: Payer: Self-pay | Admitting: *Deleted

## 2017-06-13 VITALS — BP 154/75 | HR 72 | Temp 99.6°F | Ht 65.0 in | Wt 196.0 lb

## 2017-06-13 DIAGNOSIS — D649 Anemia, unspecified: Secondary | ICD-10-CM

## 2017-06-13 DIAGNOSIS — F419 Anxiety disorder, unspecified: Secondary | ICD-10-CM | POA: Diagnosis not present

## 2017-06-13 DIAGNOSIS — Z87891 Personal history of nicotine dependence: Secondary | ICD-10-CM

## 2017-06-13 DIAGNOSIS — R5383 Other fatigue: Secondary | ICD-10-CM

## 2017-06-13 DIAGNOSIS — E875 Hyperkalemia: Secondary | ICD-10-CM

## 2017-06-13 DIAGNOSIS — Z66 Do not resuscitate: Secondary | ICD-10-CM

## 2017-06-13 DIAGNOSIS — R51 Headache: Secondary | ICD-10-CM

## 2017-06-13 DIAGNOSIS — R293 Abnormal posture: Secondary | ICD-10-CM | POA: Diagnosis not present

## 2017-06-13 DIAGNOSIS — N184 Chronic kidney disease, stage 4 (severe): Secondary | ICD-10-CM | POA: Diagnosis not present

## 2017-06-13 DIAGNOSIS — Z9119 Patient's noncompliance with other medical treatment and regimen: Secondary | ICD-10-CM

## 2017-06-13 DIAGNOSIS — I129 Hypertensive chronic kidney disease with stage 1 through stage 4 chronic kidney disease, or unspecified chronic kidney disease: Secondary | ICD-10-CM

## 2017-06-13 DIAGNOSIS — R0789 Other chest pain: Secondary | ICD-10-CM | POA: Diagnosis not present

## 2017-06-13 DIAGNOSIS — R42 Dizziness and giddiness: Secondary | ICD-10-CM | POA: Diagnosis not present

## 2017-06-13 DIAGNOSIS — F329 Major depressive disorder, single episode, unspecified: Secondary | ICD-10-CM

## 2017-06-13 DIAGNOSIS — Z79899 Other long term (current) drug therapy: Secondary | ICD-10-CM

## 2017-06-13 DIAGNOSIS — M542 Cervicalgia: Secondary | ICD-10-CM | POA: Diagnosis not present

## 2017-06-13 NOTE — Telephone Encounter (Signed)
WALK-IN Pt presents at front wanting to be seen, states for 3 weeks she has felt extreme weakness, "got to catch my breath sometimes", had episode at grocery store last week and store personnel had to assist her. Spoke w/ dr Mikey Bussing, will see this pm in [facility], informed gladys h.

## 2017-06-13 NOTE — Assessment & Plan Note (Addendum)
Patient presents with 3 week history of fatigue, feeling 'disconnected', and intermittent chest pressure. She says that She feels tired. Her chest pressure is nonexertional, and can happen at any time of the day. She denies pleuritic chest pain. She has chronic headaches. She has intermittent SOB. She does not smoke.   Prior most recent labwork indicated that her HgB was 9.3. She denies any blood loss. She also has borderline hyperkalemia in the presence of CKD4 and follows renal. She has been noncompliant with her renal diet.   She says that she goes to sleep at 10 PM, but will be up in 2 hours, and would sleep intermittently until 4 AM. She takes 15 minutes naps during the day. She does not know if she snores. She does feel fatigued during the day. She had a sleep study done in 2012 which showed an AHI of 9. She does not wear CPAP. She denies vomiting, abd pain, diarrhea, or other myalgias.   Differentials for her fatigue are broad at this point.  An EKG was obtained and personally reviewed and showed no ST or  T wave changes, and compared to prior one to rule out cardiac causes. She had a stress test in 2015 which showed low risk, so she may need another stress test if workup unrevealing.  We will start by ruling out worsening anemia, and checking her electrolytes. If they are WNL, then pt will benefit from having another sleep study- as pt has gained weight and her BMI is 32, she may have worsening of sleep apnea.  Furthermore, her fatigue could be medication induced as she takes multiple psychiatric medications including zoloft and wellbutrin. Metoprolol is known to cause fatigue in upto 10% of the patients- but given that she has been on this medication for some time, I am hesitant to change it, and she was also slightly hypertensive today. Pt did not have any signs of volume overload today, except slight weight gain of 3 -5 lbs. Her last echo was in 2014 which showed normal EF and G1DD.  Her ESR  and CRP are elevated in June 2018, so this could be rheumatologic. TSh was checked few years ago and was WNL. Finally, pt does have existing depression, anxiety and symptoms of fibromyalgia in the past- so this could be the reason too.   Plan -CBC, BMET -check TSH -repeat Sleep studies if above etiologies unrevealing as last sleep study in 2012.  - consider repeat stress test if pt continually symptomatic, though I doubt this is cardiac etiology. Consider echo as last echo in 2014. - may need re-assessment of her depression medications., and also antihypertensives -follow up in 4 weeks   Addendum: 8/21 Labs reviewed.  Patient has BUN 41/Cr 2.58, which has slightly worsened than last time. hgB is 10. She is currently following up with Nephrology and saw Dr Hollie Salk in June when she had the labs drawn. 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   Her symptoms may be due to progression of her renal disease. However, lab values are similar to the ones in June. Her ferritin was 52, normal platelets, white count, and HgB of 10. She is on oral iron replacement therapy.  In the note, she indicated that she does not want to go on dialysis and is DNR. She also does not want any transfusion- recommend that we verify this with her.   Patient is also due for colonoscopy.  I have called the patient and asked her to come back in 1-2 weeks for further work-up of her symptoms and discussion. Patient will make appointment.

## 2017-06-13 NOTE — Progress Notes (Signed)
    CC: fatigue HPI: [name] is a 66 y.o. woman with PMH noted below here for fatigue for 3 weeks  Please see Problem List/A&P for the status of the patient's chronic medical problems   Past Medical History:  Diagnosis Date  . Anemia   . Arthritis   . Borderline personality disorder   . CAD (coronary artery disease)    Catherization 11/18/09>nonobstructive CAD , normal LV function, EF 65%  . Cervicalgia   . Chronic kidney disease (CKD), stage IV (severe) (HCC)   . Dental caries    periodontitis and mandibular tori  . Depression   . Diabetes mellitus   . Diabetes mellitus, type II (HCC)   . Diabetic gastroparesis (HCC)   . Diastolic CHF (HCC)    Grade I, on Echo 06/2013, EF 55-60%  . Dyspnea   . Fibromyalgia   . GERD (gastroesophageal reflux disease)   . Gout   . Headache(784.0)   . Health maintenance examination    Colonoscopy 2009> normal; Diabetic eye exam 12/2008. No diabetic retinopathy; Mammogram 11/10> No evidence of malignancy, DXA 03/14/13 : normal  . History of hiatal hernia   . Hyperlipidemia   . Hypertension   . IBS (irritable bowel syndrome)   . Iliotibial band syndrome   . Low back pain syndrome   . Nonorganic psychosis   . Peripheral neuropathy   . Right carpal tunnel syndrome 09/13/2014  . SBO (small bowel obstruction) (HCC) 01/09/2013  . Sleep apnea    does not wear CPAP   . Suicide attempt [facility])    " by overdose of whatever I had"  . Vitamin B12 deficiency   . Vitamin D deficiency   . Wears dentures    upper  . Wears glasses   . Wears glasses     Review of Systems:  Constitutional: Negative for fever, chills, weight loss but has fatigue HEENT: has headaches but they are chronic and unchanged. No vision changes.  Respiratory: Negative for cough, shortness of breath and wheezing. Cardiovascular: Negative for chest pain, intermittent chest pressure  Gastrointestinal: Negative for heartburn, +nausea but no vomiting, abdominal pain,  diarrhea and constipation. No hematochezia, or melena GU: negative for hematuria Musculoskeletal: Negative for myalgias, joint pain, has fallen recently   Physical Exam: Vitals:   06/13/17 1619  BP: (!) 154/75  Pulse: 72  Temp: 99.6 F (37.6 C)  TempSrc: Oral  SpO2: 98%  Weight: 196 lb (88.9 kg)  Height: 5\' 5"  (1.651 m)    General: A&O, in NAD HEENT: EOMI, PERRLA Neck: supple, midline trachea,  CV: RRR, normal s1, s2, no m/r/g,  Resp: equal and symmetric breath sounds, no wheezing or crackles  Abdomen: soft, nontender, nondistended, +BS Skin: warm, dry, intact, no open lesions or rashes noted Extremities: no edema  Neurologic:  Neurologic exam: MS: A&O to person, time, place, and president CN II-XII grossly intact DTRs: 2+ and symmetric. No hyperreflexia  Sensory: intact to light touch Motor: 5/5 strength in upper, 5/5 in lower extremities, normal muscle tone Cerebellar: unremarkable Psyc: pleasant personality, affect appropriate to mood, no focal deficits     Assessment & Plan:   See encounters tab for problem based medical decision making. Patient discussed with Dr. Heide Spark

## 2017-06-13 NOTE — Patient Instructions (Signed)
Thank you for your visit today  I will call you with your results  If you have any chest pain, or worsening shortness of breath, then please go to the nearest emergency room  Please follow up in 4 weeks

## 2017-06-14 ENCOUNTER — Ambulatory Visit: Payer: Self-pay

## 2017-06-14 ENCOUNTER — Ambulatory Visit (INDEPENDENT_AMBULATORY_CARE_PROVIDER_SITE_OTHER): Payer: Medicare Other | Admitting: Family Medicine

## 2017-06-14 ENCOUNTER — Ambulatory Visit (HOSPITAL_COMMUNITY)
Admission: RE | Admit: 2017-06-14 | Discharge: 2017-06-14 | Disposition: A | Discharge status: Home / Self Care | Payer: Medicare Other | Source: Ambulatory Visit | Attending: Family Medicine | Admitting: Family Medicine

## 2017-06-14 ENCOUNTER — Encounter: Payer: Self-pay | Admitting: Family Medicine

## 2017-06-14 DIAGNOSIS — M4696 Unspecified inflammatory spondylopathy, lumbar region: Secondary | ICD-10-CM | POA: Diagnosis not present

## 2017-06-14 DIAGNOSIS — M47816 Spondylosis without myelopathy or radiculopathy, lumbar region: Secondary | ICD-10-CM

## 2017-06-14 LAB — BMP8+ANION GAP
Anion Gap: 19 mmol/L — ABNORMAL HIGH (ref 10.0–18.0)
BUN / CREAT RATIO: 16 (ref 12–28)
BUN: 41 mg/dL — ABNORMAL HIGH (ref 8–27)
CALCIUM: 10 mg/dL (ref 8.7–10.3)
CHLORIDE: 98 mmol/L (ref 96–106)
CO2: 20 mmol/L (ref 20–29)
Creatinine, Ser: 2.58 mg/dL — ABNORMAL HIGH (ref 0.57–1.00)
GFR calc non Af Amer: 19 mL/min/{1.73_m2} — ABNORMAL LOW (ref >59)
GFR, EST AFRICAN AMERICAN: 22 mL/min/{1.73_m2} — AB (ref >59)
GLUCOSE: 216 mg/dL — AB (ref 65–99)
POTASSIUM: 4.6 mmol/L (ref 3.5–5.2)
SODIUM: 137 mmol/L (ref 134–144)

## 2017-06-14 LAB — CBC
Hematocrit: 32.1 % — ABNORMAL LOW (ref 34.0–46.6)
Hemoglobin: 10 g/dL — ABNORMAL LOW (ref 11.1–15.9)
MCH: 22.8 pg — AB (ref 26.6–33.0)
MCHC: 31.2 g/dL — AB (ref 31.5–35.7)
MCV: 73 fL — ABNORMAL LOW (ref 79–97)
Platelets: 370 10*3/uL (ref 150–379)
RBC: 4.39 x10E6/uL (ref 3.77–5.28)
RDW: 16.5 % — AB (ref 12.3–15.4)
WBC: 12.6 10*3/uL — ABNORMAL HIGH (ref 3.4–10.8)

## 2017-06-14 LAB — TSH: TSH: 2.6 u[IU]/mL (ref 0.450–4.500)

## 2017-06-14 NOTE — Assessment & Plan Note (Signed)
Facet arthritis bilaterally. We discussed with patient and did well with the epidural. His lungs patient does well can follow-up more on an as needed. Continue medications at this time.

## 2017-06-14 NOTE — Progress Notes (Signed)
[name] [address] [place], [place] [address] Phone: [phone] Subjective:   CC: low back strain f/u  VCB:[id number]  [name] is a 66 y.o. female coming in with complaint ofpain in her left posterior hip and back.   More of a lumbar radiculopathy. Patient was given facet injections back in June. Patient states and is feeling significantly better. Patient states 90% better at this time. No numbness.  Overall very happy with the results.    relevant past medical history also includes sacroiliac arthritis which patient has received injections last one 10/17CT pelvis taken 06/24/2016 was independently visualized by me showing moderate to severe degenerative disc disease of the lumbar spine. Facet injections 04/20/2017.   Past Medical History:  Diagnosis Date  . Anemia   . Arthritis   . Borderline personality disorder   . CAD (coronary artery disease)    Catherization 11/18/09>nonobstructive CAD , normal LV function, EF 65%  . Cervicalgia   . Chronic kidney disease (CKD), stage IV (severe) (HCC)   . Dental caries    periodontitis and mandibular tori  . Depression   . Diabetes mellitus   . Diabetes mellitus, type II (HCC)   . Diabetic gastroparesis (HCC)   . Diastolic CHF (HCC)    Grade I, on Echo 06/2013, EF 55-60%  . Dyspnea   . Fibromyalgia   . GERD (gastroesophageal reflux disease)   . Gout   . Headache(784.0)   . Health maintenance examination    Colonoscopy 2009> normal; Diabetic eye exam 12/2008. No diabetic retinopathy; Mammogram 11/10> No evidence of malignancy, DXA 03/14/13 : normal  . History of hiatal hernia   . Hyperlipidemia   . Hypertension   . IBS (irritable bowel syndrome)   . Iliotibial band syndrome   . Low back pain syndrome   . Nonorganic psychosis   . Peripheral neuropathy   . Right carpal tunnel syndrome 09/13/2014  . SBO (small bowel obstruction) (HCC) 01/09/2013  . Sleep apnea    does not wear CPAP   . Suicide  attempt [facility])    " by overdose of whatever I had"  . Vitamin B12 deficiency   . Vitamin D deficiency   . Wears dentures    upper  . Wears glasses   . Wears glasses    Past Surgical History:  Procedure Laterality Date  . ABDOMINAL HYSTERECTOMY    . APPENDECTOMY    . BOWEL RESECTION N/A 01/10/2013   Procedure: SMALL BOWEL RESECTION;  Surgeon: Lodema Pilot, DO;  Location: [facility] OR;  Service: General;  Laterality: N/A;  . CARPAL TUNNEL RELEASE Right 09/13/2014   Procedure: RIGHT WRIST CARPAL TUNNEL RELEASE;  Surgeon: Eulas Post, MD;  Location: [facility];  Service: Orthopedics;  Laterality: Right;  . CARPAL TUNNEL RELEASE Left 02/28/2015   Procedure: LEFT CARPAL TUNNEL RELEASE;  Surgeon: Teryl Lucy, MD;  Location: [facility];  Service: Orthopedics;  Laterality: Left;  . CATARACT EXTRACTION W/ INTRAOCULAR LENS  IMPLANT, BILATERAL    . COLONOSCOPY    . HAND SURGERY  1992   rt  . HERNIA REPAIR  03/01/14   Lap incisional hernia repair w/mesh  . INCISIONAL HERNIA REPAIR N/A 03/01/2014   Procedure: LAPAROSCOPIC INCISIONAL HERNIA;  Surgeon: Axel Filler, MD;  Location: [facility];  Service: General;  Laterality: N/A;  . INSERTION OF MESH N/A 03/01/2014   Procedure: INSERTION OF MESH;  Surgeon: Axel Filler, MD;  Location: [facility];  Service:  General;  Laterality: N/A;  . LAPAROTOMY N/A 01/10/2013   Procedure: EXPLORATORY LAPAROTOMY;  Surgeon: Lodema Pilot, DO;  Location: [facility];  Service: General;  Laterality: N/A;  . LYSIS OF ADHESION N/A 01/10/2013   Procedure: LYSIS OF ADHESION;  Surgeon: Lodema Pilot, DO;  Location: [facility];  Service: General;  Laterality: N/A;  . MULTIPLE TOOTH EXTRACTIONS    . STERIOD INJECTION Left 09/13/2014   Procedure: STEROID INJECTION LEFT HAND;  Surgeon: Eulas Post, MD;  Location: [facility];  Service: Orthopedics;  Laterality: Left;  . TOOTH EXTRACTION Bilateral 05/13/2017   Procedure: DENTAL RESTORATION/EXTRACTIONS;   Surgeon: Ocie Doyne, DDS;  Location[facility];  Service: Oral Surgery;  Laterality: Bilateral;   Social History   Social History  . Marital status: Married    Spouse name: N/A  . Number of children: N/A  . Years of education: N/A   Social History Main Topics  . Smoking status: Former Smoker    Types: Cigarettes    Quit date: 10/25/2002  . Smokeless tobacco: Never Used  . Alcohol use No  . Drug use: No  . Sexual activity: Not Currently   Other Topics Concern  . None   Social History Narrative  . None   Allergies  Allergen Reactions  . Penicillins Hives, Itching and Other (See Comments)    Gets delusional and gets hives  Has patient had a PCN reaction causing immediate rash, facial/tongue/throat swelling, SOB or lightheadedness with hypotension: Yes Has patient had a PCN reaction causing severe rash involving mucus membranes or skin necrosis: No Has patient had a PCN reaction that required hospitalization No Has patient had a PCN reaction occurring within the last 10 years: No If all of the above answers are "NO", then may proceed with Cephalosporins  . Cimetidine Other (See Comments)    Tagamet-Breast swelling  . Indomethacin Diarrhea    Caused severe diarrhea with episodes of incontinance  . Adhesive [Tape] Other (See Comments)    Causes bruises Okay to use paper tape only  . Sulfamethoxazole Other (See Comments)    Unknown reaction    Family History  Problem Relation Age of Onset  . Diabetes Mother   . Hypertension Mother   . Hyperlipidemia Mother   . Arthritis Mother   . Depression Mother   . Heart disease Father   . Diabetes Brother   . Heart disease Brother   . Diabetes Brother   . Heart disease Paternal Grandmother     Past medical history, social, surgical and family history all reviewed in electronic medical record.  No pertanent information unless stated regarding to the chief complaint.   Review of Systems: No headache, visual changes, nausea,  vomiting, diarrhea, constipation, dizziness, abdominal pain, skin rash, fevers, chills, night sweats, weight loss, swollen lymph nodes, body aches, joint swelling,  chest pain, shortness of breath, mood changes.  + muscle pain.   Objective  Blood pressure 110/70, pulse 76, height 5\' 5"  (1.651 m), weight 195 lb (88.5 kg), SpO2 95 %.  Systems examined below as of 06/14/17 General: NAD A&O x3 mood, affect normal  HEENT: Pupils equal, extraocular movements intact no nystagmus Respiratory: not short of breath at rest or with speaking Cardiovascular: No lower extremity edema, non tender Skin: Warm dry intact with no signs of infection or rash on extremities or on axial skeleton. Abdomen: Soft nontender, no masses Neuro: Cranial nerves  intact, neurovascularly intact in all extremities with 2+ DTRs and 2+ pulses. Lymph:  No lymphadenopathy appreciated today  Gait normal with good balance and coordination.  MSK: Non tender with full range of motion and good stability and symmetric strength and tone of shoulders, elbows, wrist,  knee hips and ankles bilaterally.  Arthritic changes of multiple joints Back Exam:  Inspection: Loss of lordosis  Motion: Flexion 35 deg, Extension 25 deg, Side Bending to 35 deg bilaterally,  Rotation to 35 deg bilaterally  SLR laying: Negative XSLR laying: Negative  Palpable tenderness: Worsening discomfort in the paraspinal musculature of the lumbar spine FABER: Negative Sensory change: Gross sensation intact to all lumbar and sacral dermatomes.  Reflexes: 2+ at both patellar tendons, 2+ at achilles tendons, Babinski's downgoing.  Strength at foot  5 symmetric strength    Impression and Recommendations:     This case required medical decision making of moderate complexity.      Note: This dictation was prepared with Dragon dictation along with smaller phrase technology. Any transcriptional errors that result from this process are unintentional.

## 2017-06-16 ENCOUNTER — Encounter: Payer: Self-pay | Admitting: Rehabilitation

## 2017-06-16 ENCOUNTER — Ambulatory Visit: Payer: Medicare Other | Admitting: Rehabilitation

## 2017-06-16 DIAGNOSIS — M542 Cervicalgia: Secondary | ICD-10-CM

## 2017-06-16 DIAGNOSIS — R293 Abnormal posture: Secondary | ICD-10-CM

## 2017-06-16 NOTE — Therapy (Signed)
[facility] [address] [place], [place], [address] Phone: [phone]   Fax:  [phone]  Patient Details  Name: [name] MRN: [id number] Date of Birth: [birth year] Referring Provider:  Toney Rakes, MD  Encounter Date: 06/16/2017    Pt presents today for scheduled appt.  Upon questioning, note that she has not been performing exercises and has not "been up to" coming to therapy.  Discussed with pt that PT feels we could make an impact with her neck/shoulder pain and headaches, however she has to be present for therapy and compliant with HEP at home.  Pt feels that she is unable to do this now due to many other issues going on in her life.  PT to place pt on hold at this time and pt to call back to schedule when she feels like she is able to attend more regularly.      Harriet Butte, PT, MPT [facility] [address] [place], [place], [address] Phone: [phone]   Fax:  [phone] 06/16/17, 9:03 AM

## 2017-06-16 NOTE — Addendum Note (Signed)
Addended by: Earl Lagos on: 06/16/2017 04:28 PM   Modules accepted: Level of Service

## 2017-06-16 NOTE — Progress Notes (Signed)
Internal Medicine Clinic Attending  Case discussed with Dr. Saraiya at the time of the visit.  We reviewed the resident's history and exam and pertinent patient test results.  I agree with the assessment, diagnosis, and plan of care documented in the resident's note.  

## 2017-06-21 ENCOUNTER — Ambulatory Visit (HOSPITAL_COMMUNITY): Payer: Self-pay | Admitting: Clinical

## 2017-06-21 ENCOUNTER — Ambulatory Visit: Payer: Medicare Other | Admitting: Rehabilitative and Restorative Service Providers"

## 2017-06-23 ENCOUNTER — Ambulatory Visit: Payer: Self-pay | Admitting: Rehabilitative and Restorative Service Providers"

## 2017-06-29 ENCOUNTER — Encounter: Payer: Self-pay | Admitting: Internal Medicine

## 2017-06-29 ENCOUNTER — Ambulatory Visit (INDEPENDENT_AMBULATORY_CARE_PROVIDER_SITE_OTHER): Payer: Medicare Other | Admitting: Internal Medicine

## 2017-06-29 VITALS — BP 136/73 | HR 88 | Temp 99.0°F | Ht 65.0 in | Wt 196.7 lb

## 2017-06-29 DIAGNOSIS — R42 Dizziness and giddiness: Secondary | ICD-10-CM

## 2017-06-29 DIAGNOSIS — F329 Major depressive disorder, single episode, unspecified: Secondary | ICD-10-CM | POA: Diagnosis not present

## 2017-06-29 DIAGNOSIS — G4700 Insomnia, unspecified: Secondary | ICD-10-CM | POA: Diagnosis not present

## 2017-06-29 DIAGNOSIS — Z87891 Personal history of nicotine dependence: Secondary | ICD-10-CM

## 2017-06-29 DIAGNOSIS — Z79899 Other long term (current) drug therapy: Secondary | ICD-10-CM | POA: Diagnosis not present

## 2017-06-29 DIAGNOSIS — G4733 Obstructive sleep apnea (adult) (pediatric): Secondary | ICD-10-CM

## 2017-06-29 DIAGNOSIS — Z818 Family history of other mental and behavioral disorders: Secondary | ICD-10-CM

## 2017-06-29 DIAGNOSIS — R5382 Chronic fatigue, unspecified: Secondary | ICD-10-CM | POA: Diagnosis not present

## 2017-06-29 DIAGNOSIS — F331 Major depressive disorder, recurrent, moderate: Secondary | ICD-10-CM

## 2017-06-29 NOTE — Assessment & Plan Note (Addendum)
Assessment:  PHQ9 Score 16. Currently not well controlled. No SI/HI, but experiencing depression symptoms impacting patients quality of life.  Plan: -continue buproprion 450 mg daily -continue zoloft 200 mg daily -encouraged patient to discuss her symptoms with her psychiatrist at her appointment next month

## 2017-06-29 NOTE — Assessment & Plan Note (Addendum)
Assessment: Patient's symptoms of fatigue is stable, but not improved from last visit. Laboratory work up from 06/13/2017 revealed Hbg 10.0,  TSH within normal limits, Cr 2.58, and Ferritin 52 (03/2017). Pts symptoms are most likely multifactorial secondary to her depression (PHQ9 score today is 16), difficulty falling asleep, and microcytic anemia. . The patient had a sleep study performed in 12/ 2012 that showed mild obstructive apnea/hypoapnea. The patient does not use a CPAP machine. OSA may also be contributing to this patients daytime sleepiness and fatigue.   Plan: -Sleep study referral  -Pt was encouraged to follow up with her psychiatrist within a month to discuss depression symptoms  -No further lab work up indicated at this time -Continue to monitor Hgb and CKD progression

## 2017-06-29 NOTE — Progress Notes (Signed)
CC: fatigue  HPI:  [name] is a 66 y.o. with past medical history as documented below presenting with a chief complaint of fatigue. [name] was last seen in clinic on 06/13/2017 for similar complaint of fatigue. She states that she continues to feel tired, drained, and has little motivation to leave the house, and has lack of interest in activities. She says she feels disconnected from her church and church friends. Her daughter passed away two years ago around this time and she thinks this is contributing to her depression symptoms. She has also been experiencing increased appetite and decreased sleep for the past six months. She states she has difficulty falling asleep. The only recent change in the patients medication is an increase in her gabapentin. She denies suicidal ideation.     Past Medical History:  Diagnosis Date  . Anemia   . Arthritis   . Borderline personality disorder   . CAD (coronary artery disease)    Catherization 11/18/09>nonobstructive CAD , normal LV function, EF 65%  . Cervicalgia   . Chronic kidney disease (CKD), stage IV (severe) (HCC)   . Dental caries    periodontitis and mandibular tori  . Depression   . Diabetes mellitus   . Diabetes mellitus, type II (HCC)   . Diabetic gastroparesis (HCC)   . Diastolic CHF (HCC)    Grade I, on Echo 06/2013, EF 55-60%  . Dyspnea   . Fibromyalgia   . GERD (gastroesophageal reflux disease)   . Gout   . Headache(784.0)   . Health maintenance examination    Colonoscopy 2009> normal; Diabetic eye exam 12/2008. No diabetic retinopathy; Mammogram 11/10> No evidence of malignancy, DXA 03/14/13 : normal  . History of hiatal hernia   . Hyperlipidemia   . Hypertension   . IBS (irritable bowel syndrome)   . Iliotibial band syndrome   . Low back pain syndrome   . Nonorganic psychosis   . Peripheral neuropathy   . Right carpal tunnel syndrome 09/13/2014  . SBO (small bowel obstruction) (HCC) 01/09/2013  . Sleep apnea     does not wear CPAP   . Suicide attempt [facility](HCC)    " by overdose of whatever I had"  . Vitamin B12 deficiency   . Vitamin D deficiency   . Wears dentures    upper  . Wears glasses   . Wears glasses    Review of Systems:   Review of Systems  Constitutional: Positive for malaise/fatigue. Negative for chills and fever.  HENT: Negative.   Eyes: Negative.   Respiratory: Positive for shortness of breath. Negative for cough and wheezing.   Cardiovascular: Positive for chest pain. Negative for leg swelling.  Gastrointestinal: Positive for abdominal pain, heartburn and nausea. Negative for blood in stool, constipation, diarrhea, melena and vomiting.  Genitourinary: Positive for dysuria. Negative for hematuria and urgency.  Musculoskeletal: Positive for joint pain and myalgias.  Neurological: Positive for dizziness, weakness and headaches. Negative for seizures.  Endo/Heme/Allergies: Negative.   Psychiatric/Behavioral: Positive for depression. Negative for suicidal ideas. The patient is nervous/anxious and has insomnia.     Physical Exam:  Vitals:   06/29/17 1534  BP: 136/73  Pulse: 88  Temp: 99 F (37.2 C)  TempSrc: Oral  SpO2: 98%  Weight: 196 lb 11.2 oz (89.2 kg)  Height: 5\' 5"  (1.651 m)   General: NAD HEENT: [place]/AT, EOMI, no scleral icterus, PERRL Cardiac: RRR, No R/M/G appreciated Pulm: normal effort, CTAB Abd: soft, non tender, non distended, BS  normal Ext: extremities well perfused, no peripheral edema Neuro: alert and oriented X3, cranial nerves II-XII grossly intact Psych: Affect normal    Assessment & Plan:   See Encounters Tab for problem based charting.  Patient seen with Dr. Heide Spark

## 2017-06-29 NOTE — Patient Instructions (Signed)
[name],   It was a pleasure meeting you today.   Today we discussed what may be causing your tiredness and fatigue. This may be caused by your depression, anxiety, as well as low blood counts (anemia). Day time sleepiness may also be caused by sleep apnea. I would like for you to get a sleep study done to see if you have sleep apnea.   I have put in a referral for a sleep study.

## 2017-07-01 NOTE — Progress Notes (Signed)
Internal Medicine Clinic Attending  I saw and evaluated the patient.  I personally confirmed the key portions of the history and exam documented by Dr. LaCroce and I reviewed pertinent patient test results.  The assessment, diagnosis, and plan were formulated together and I agree with the documentation in the resident's note.  

## 2017-07-04 ENCOUNTER — Other Ambulatory Visit: Payer: Self-pay | Admitting: Family Medicine

## 2017-07-04 NOTE — Telephone Encounter (Signed)
Refill done.  

## 2017-07-05 ENCOUNTER — Encounter: Payer: Self-pay | Admitting: Neurology

## 2017-07-05 ENCOUNTER — Ambulatory Visit (INDEPENDENT_AMBULATORY_CARE_PROVIDER_SITE_OTHER): Payer: Medicare Other | Admitting: Neurology

## 2017-07-05 VITALS — BP 135/69 | HR 69 | Ht 65.0 in | Wt 196.0 lb

## 2017-07-05 DIAGNOSIS — N184 Chronic kidney disease, stage 4 (severe): Secondary | ICD-10-CM

## 2017-07-05 DIAGNOSIS — R5383 Other fatigue: Secondary | ICD-10-CM

## 2017-07-05 DIAGNOSIS — R351 Nocturia: Secondary | ICD-10-CM | POA: Diagnosis not present

## 2017-07-05 DIAGNOSIS — F32A Depression, unspecified: Secondary | ICD-10-CM

## 2017-07-05 DIAGNOSIS — I10 Essential (primary) hypertension: Secondary | ICD-10-CM | POA: Diagnosis not present

## 2017-07-05 DIAGNOSIS — E1122 Type 2 diabetes mellitus with diabetic chronic kidney disease: Secondary | ICD-10-CM

## 2017-07-05 DIAGNOSIS — G4719 Other hypersomnia: Secondary | ICD-10-CM

## 2017-07-05 DIAGNOSIS — F329 Major depressive disorder, single episode, unspecified: Secondary | ICD-10-CM | POA: Diagnosis not present

## 2017-07-05 NOTE — Progress Notes (Signed)
SLEEP MEDICINE CLINIC   Provider:  Melvyn Novas, M D  Primary Care Physician:  Toney Rakes, MD   Referring Provider:Lacroce, Ames Coupe, MD Resident Clinic [facility].    Chief Complaint  Patient presents with  . New Patient (Initial Visit)    pt alone, room 10, pt states that she feels so tired. pt states she feels "blah". Sometimes she has some dizziness and she has to stop what she is doing to re focus. pt is anemic, depressed and been strugling on and off with insomnia. when asked if anything has changed or if she is struggling with anything the patient states that in july was 2nd anniversary of her daughter's death and yesterday was her birthday. pt states she has struggled  this year worse then the 1st year.     HPI:  [name] is a 66 y.o. female , seen here as in a referral/ revisit  from Dr. Minda Meo for chronic fatigue.   Chief complaint according to patient : " I am much more fatigued than depressed "  Today's 07/05/2017 and I have the pleasure of meeting [name], a 66 year old African-American right-handed female with an extensive past medical history. She is referred for a complaint of fatigue. Stating that she continues to feel tired, drained of energy and has little motivation to leave the house and has lost interest in many activities she used to enjoy. She feels disconnected from her church and friends. She has significant depression symptoms but also culminates around the anniversary of her daughter's death 2 years ago. She reports difficulties falling asleep. Dr. Minda Meo further reports that the patient was at one time diagnosed with sleep apnea but does not wear CPAP.  The patient reports that she had her first sleep study many years ago and was placed on CPAP but she could not tolerate the mask and her husband could not tolerate the noise of the machine at night. The patient then had a repeat sleep study in March 2014 or thereabouts but was told  that the study was not valid. She did not sleep enough. At this time she is not even sure if she has apnea. She now sleeps alone and sleeps by herself.    Sleep habits are as follows: Mrs. Messing goes to bed between 10 and 11 PM and usually likes to read in bed. Some nights she has great difficulties falling asleep other nights not. Over the last 2 or 3 weeks she reports that every other night she has difficulties initiating sleep. Her bedroom is cool, quiet and dark. She uses a fan and air conditioning at 63F. The patient uses a wedge in bed, 2 pillows but sleeps on her side. The elevation of her torso has helped with gastroesophageal reflux. She reports also frequent dreaming, often these are and I'm comfortable dreams. These do not seem to wake her. She usually sleeps only an hour or 2 uninterrupted and then wakes up, it is not the urge to urinate that wakes her but she usually goes to the bathroom. Most of these nocturnal arousals are without a known trigger. She denies any palpitations, diaphoresis, pain. The patient does not have a set time to rise in the morning, she does not have any obligation or work schedule. She never rises later than 6 AM anyway. She is usually already awake between 4 and 5 AM. Overall this patient gets less than 5 hours of sleep on average night.   Sleep medical history  and family sleep history:  There is no family history of sleep apnea, except for a brother. 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 The patient wears full dentures upper. Also lower dentures. History of non-organic psychosis, suicide attempt, borderline personality  disorder.   Social history: The patient is married, an adult daughter died 2 years ago at age 15 - she died of diabetic ketoacidosis. The couple has one son. Her husband's 3 children are her stepchildren 2 sons and a daughter. She has 3 grandchildren, 2 grandsons through her daughter .  She drinks every other day or so one cup of coffee in the morning, she drinks diet Pepsi also not daily, ice tea or hot tea not every day.  She has no known shift work history, she does not use tobacco products quit smoking in 2004. Alcohol use quit in 2004.  Review of Systems: Out of a complete 14 system review, the patient complains of only the following symptoms, and all other reviewed systems are negative.  Sleep deprivation versus insomnia.  Epworth score 8  , Fatigue severity score 63   , depression score 11/15 high - followed by dr Donell Beers.  headaches followed by Dr Karel Jarvis. Back problems by Dr Katrinka Blazing, carpal tunnel by Dr Dion Saucier.    Social History   Social History  . Marital status: Married    Spouse name: N/A  . Number of children: N/A  . Years of education: N/A   Occupational History  . Not on file.   Social History Main Topics  . Smoking status: Former Smoker    Types: Cigarettes    Quit date: 10/25/2002  . Smokeless tobacco: Never Used  . Alcohol use No  . Drug use: No  . Sexual activity: Not Currently   Other Topics Concern  . Not on file   Social History Narrative  . No narrative on file    Family History  Problem Relation Age of Onset  . Diabetes Mother   . Hypertension Mother   . Hyperlipidemia Mother   . Arthritis Mother   . Depression Mother   . Heart disease Father   . Diabetes Brother   . Heart disease Brother   . Diabetes Brother   . Heart disease Paternal Grandmother     Past Medical History:  Diagnosis Date  . Anemia   . Arthritis   . Borderline personality disorder   . CAD (coronary artery disease)    Catherization 11/18/09>nonobstructive CAD , normal  LV function, EF 65%  . Cervicalgia   . Chronic kidney disease (CKD), stage IV (severe) (HCC)   . Dental caries    periodontitis and mandibular tori  . Depression   . Diabetes mellitus   . Diabetes mellitus, type II (HCC)   . Diabetic gastroparesis (HCC)   . Diastolic CHF (HCC)    Grade I, on Echo 06/2013, EF 55-60%  . Dyspnea   . Fibromyalgia   . GERD (gastroesophageal reflux disease)   . Gout   . Headache(784.0)   . Health maintenance examination    Colonoscopy 2009> normal; Diabetic eye exam 12/2008. No diabetic retinopathy; Mammogram 11/10> No evidence of malignancy, DXA 03/14/13 : normal  . History of hiatal hernia   . Hyperlipidemia   .  Hypertension   . IBS (irritable bowel syndrome)   . Iliotibial band syndrome   . Low back pain syndrome   . Nonorganic psychosis   . Peripheral neuropathy   . Right carpal tunnel syndrome 09/13/2014  . SBO (small bowel obstruction) (HCC) 01/09/2013  . Sleep apnea    does not wear CPAP   . Suicide attempt [facility])    " by overdose of whatever I had"  . Vitamin B12 deficiency   . Vitamin D deficiency   . Wears dentures    upper  . Wears glasses   . Wears glasses     Past Surgical History:  Procedure Laterality Date  . ABDOMINAL HYSTERECTOMY    . APPENDECTOMY    . BOWEL RESECTION N/A 01/10/2013   Procedure: SMALL BOWEL RESECTION;  Surgeon: Lodema Pilot, DO;  Location: [facility] OR;  Service: General;  Laterality: N/A;  . CARPAL TUNNEL RELEASE Right 09/13/2014   Procedure: RIGHT WRIST CARPAL TUNNEL RELEASE;  Surgeon: Eulas Post, MD;  Location: [facility];  Service: Orthopedics;  Laterality: Right;  . CARPAL TUNNEL RELEASE Left 02/28/2015   Procedure: LEFT CARPAL TUNNEL RELEASE;  Surgeon: Teryl Lucy, MD;  Location: [facility];  Service: Orthopedics;  Laterality: Left;  . CATARACT EXTRACTION W/ INTRAOCULAR LENS  IMPLANT, BILATERAL    . COLONOSCOPY    . HAND SURGERY  1992   rt  . HERNIA REPAIR  03/01/14   Lap  incisional hernia repair w/mesh  . INCISIONAL HERNIA REPAIR N/A 03/01/2014   Procedure: LAPAROSCOPIC INCISIONAL HERNIA;  Surgeon: Axel Filler, MD;  Location: [facility];  Service: General;  Laterality: N/A;  . INSERTION OF MESH N/A 03/01/2014   Procedure: INSERTION OF MESH;  Surgeon: Axel Filler, MD;  Location: [facility];  Service: General;  Laterality: N/A;  . LAPAROTOMY N/A 01/10/2013   Procedure: EXPLORATORY LAPAROTOMY;  Surgeon: Lodema Pilot, DO;  Location: [facility];  Service: General;  Laterality: N/A;  . LYSIS OF ADHESION N/A 01/10/2013   Procedure: LYSIS OF ADHESION;  Surgeon: Lodema Pilot, DO;  Location: [facility];  Service: General;  Laterality: N/A;  . MULTIPLE TOOTH EXTRACTIONS    . STERIOD INJECTION Left 09/13/2014   Procedure: STEROID INJECTION LEFT HAND;  Surgeon: Eulas Post, MD;  Location: [facility];  Service: Orthopedics;  Laterality: Left;  . TOOTH EXTRACTION Bilateral 05/13/2017   Procedure: DENTAL RESTORATION/EXTRACTIONS;  Surgeon: Ocie Doyne, DDS;  Location[facility];  Service: Oral Surgery;  Laterality: Bilateral;    Current Outpatient Prescriptions  Medication Sig Dispense Refill  . ACCU-CHEK AVIVA PLUS test strip TEST UPTO 4 TIMES DAILY 125 each 5  . ACCU-CHEK FASTCLIX LANCETS MISC TEST five times a day 102 each 11  . acetaminophen (TYLENOL) 325 MG tablet Take 650 mg by mouth 3 (three) times daily as needed for mild pain or headache.    . AMITIZA 24 MCG capsule Take 24 mcg by mouth 2 (two) times daily.     Marland Kitchen aspirin EC 81 MG tablet Take 81 mg by mouth daily.    . Biotin 5000 MCG TABS Take 5,000 mcg by mouth daily.    Marland Kitchen buPROPion (WELLBUTRIN XL) 150 MG 24 hr tablet Take 3 tablets (450 mg total) by mouth daily. 270 tablet 1  . cloNIDine (CATAPRES - DOSED IN MG/24 HR) 0.3 mg/24hr patch Place 1 patch (0.3 mg total) onto the skin once a week. 12 patch 2  . dexlansoprazole (DEXILANT) 60 MG capsule Take 1  capsule (60 mg total) by mouth daily. For reflux 30 capsule    . furosemide (LASIX) 40 MG tablet take 1 tablet by mouth once daily 90 tablet 1  . gabapentin (NEURONTIN) 100 MG capsule take 1 capsule by mouth twice a day 60 capsule 2  . gabapentin (NEURONTIN) 300 MG capsule take 1 capsule by mouth at bedtime 90 capsule 1  . Insulin Pen Needle (BD PEN NEEDLE NANO U/F) 32G X 4 MM MISC Use to inject insulin into the skin 100 each 6  . JANUVIA 50 MG tablet take 1 tablet by mouth once daily 90 tablet 4  . LANTUS SOLOSTAR 100 UNIT/ML Solostar Pen inject subcutaneously 51 units at bedtime (Patient taking differently: inject subcutaneously 45 units at bedtime) 30 mL 6  . LINZESS 145 MCG CAPS capsule Take 145 mcg by mouth daily as needed (consitpation).   1  . metoprolol succinate (TOPROL-XL) 100 MG 24 hr tablet take 1 tablet by mouth every morning 30 tablet 6  . Misc Natural Products (TART CHERRY ADVANCED PO) Take 1 capsule by mouth every evening.    . promethazine (PHENERGAN) 25 MG tablet Take 25 mg by mouth 2 (two) times daily as needed for nausea or vomiting.     . rosuvastatin (CRESTOR) 10 MG tablet Take 1 tablet (10 mg total) by mouth daily. 30 tablet 11  . sertraline (ZOLOFT) 100 MG tablet 2 qam (Patient taking differently: Take 200 mg by mouth at bedtime. ) 180 tablet 2  . TURMERIC CURCUMIN PO Take 1 tablet by mouth at bedtime.    . Vitamin D, Ergocalciferol, (DRISDOL) 50000 units CAPS capsule take 1 capsule by mouth every week 12 capsule 0   No current facility-administered medications for this visit.     Allergies as of 07/05/2017 - Review Complete 07/05/2017  Allergen Reaction Noted  . Penicillins Hives, Itching, and Other (See Comments)   . Cimetidine Other (See Comments)   . Indomethacin Diarrhea 03/11/2011  . Adhesive [tape] Other (See Comments) 05/10/2017  . Sulfamethoxazole Other (See Comments) 08/20/2013    Vitals: BP 135/69   Pulse 69   Ht 5\' 5"  (1.651 m)   Wt 196 lb (88.9 kg)   BMI 32.62 kg/m  Last Weight:  Wt Readings from Last 1  Encounters:  07/05/17 196 lb (88.9 kg)   RUE:AVWUBMI:Body mass index is 32.62 kg/m.     Last Height:   Ht Readings from Last 1 Encounters:  07/05/17 5\' 5"  (1.651 m)    Physical exam:  General: The patient is awake, alert and appears not in acute distress. The patient is edentulous. Head: Normocephalic, atraumatic. Neck is supple. Mallampati 5, neck circumference:15. Nasal airflow restrcited ,  Retrognathia is not seen.  Cardiovascular:  Regular rate and rhythm , without  murmurs or carotid bruit, and without distended neck veins. Respiratory: Lungs are clear to auscultation. Skin:  Without evidence of edema, or rash Trunk: BMI is elevated . Neurologic exam : The patient is awake and alert, oriented to place and time.   Memory subjective described as intact.  Mood and affect are appropriate.  Cranial nerves: Pupils are equal and briskly reactive to light.  Extraocular movements  in vertical and horizontal planes intact and without nystagmus. Visual fields by finger perimetry are intact. Hearing to finger rub intact.  Facial sensation intact to fine touch. Facial motor strength is symmetric and tongue and uvula move midline. Shoulder shrug was symmetrical.  Motor exam:  Normal tone, muscle bulk and symmetric  strength in all extremities. Sensory:  Fine touch, pinprick and vibration were tested in all extremities. Proprioception tested in the upper extremities was normal. Coordination: Rapid alternating movements in the fingers/hands was normal. Finger-to-nose maneuver  normal without evidence of ataxia, dysmetria or tremor. Gait and station: Patient walks without assistive device . Strength within normal limits. Stance is stable and normal.   Turns with 3 Steps. . Deep tendon reflexes: in the  upper and lower extremities are symmetric and intact. Babinski maneuver response is downgoing.    Assessment:  After physical and neurologic examination, review of laboratory studies,  Personal review of  imaging studies, reports of other /same  Imaging studies, results of polysomnography and / or neurophysiology testing and pre-existing records as far as provided in visit., my assessment is   While this patient has multiple risk factors for OSA, she has many co-morbidities that would cause hypersomnia and fatigue , from psychiatric illness a to CKD, diabetes and also grief.  I reviewed her sleep study at [facility] sleep center from 2012, and she had very mild sleep apnea at the time. Her apnea index was 3.5, her hypopnea index was around 5 per hour. Given the mild degree of sleep-disordered breathing I fully understand that the patient was not placed on CPAP and wouldn't need to be. I will be happy to retest the patient, but the patient is not willing to return for an attended sleep study. Since she is not excessively daytime sleepy and just fatigued it is much more likely that other medical conditions contribute to this symptom complex.   The patient was advised of the nature of the diagnosed disorder , the treatment options and the  risks for general health and wellness arising from not treating the condition.   I spent more than 40 minutes of face to face time with the patient.  Greater than 50% of time was spent in counseling and coordination of care. We have discussed the diagnosis and differential and I answered the patient's questions.    Plan:  Treatment plan and additional workup : Patient does not want a new in lab sleep study.  Her insurance status does not allow for HST - I will declare the HST a screening test for apnea and she agreed with this procedure. Her main goal should be sleep hygiene and allowing herself the opportunity to sleep earlier in the day, keeping noise out.   HST    Melvyn Novas, MD 07/05/2017, 3:11 PM  Certified in Neurology by ABPN Certified in Sleep Medicine by [facility] [address] [place], [place]  [address]

## 2017-07-05 NOTE — Patient Instructions (Signed)

## 2017-07-11 ENCOUNTER — Ambulatory Visit: Payer: Self-pay

## 2017-07-21 ENCOUNTER — Encounter: Payer: Self-pay | Admitting: Internal Medicine

## 2017-07-21 ENCOUNTER — Ambulatory Visit (INDEPENDENT_AMBULATORY_CARE_PROVIDER_SITE_OTHER): Payer: Medicare Other | Admitting: Internal Medicine

## 2017-07-21 VITALS — BP 176/65 | HR 70 | Temp 99.2°F | Ht 65.0 in | Wt 193.2 lb

## 2017-07-21 DIAGNOSIS — Z79899 Other long term (current) drug therapy: Secondary | ICD-10-CM | POA: Diagnosis not present

## 2017-07-21 DIAGNOSIS — F339 Major depressive disorder, recurrent, unspecified: Secondary | ICD-10-CM

## 2017-07-21 DIAGNOSIS — Z794 Long term (current) use of insulin: Secondary | ICD-10-CM

## 2017-07-21 DIAGNOSIS — E119 Type 2 diabetes mellitus without complications: Secondary | ICD-10-CM

## 2017-07-21 DIAGNOSIS — Z87891 Personal history of nicotine dependence: Secondary | ICD-10-CM | POA: Diagnosis not present

## 2017-07-21 DIAGNOSIS — E118 Type 2 diabetes mellitus with unspecified complications: Secondary | ICD-10-CM

## 2017-07-21 DIAGNOSIS — I10 Essential (primary) hypertension: Secondary | ICD-10-CM

## 2017-07-21 DIAGNOSIS — R5381 Other malaise: Secondary | ICD-10-CM

## 2017-07-21 DIAGNOSIS — R5383 Other fatigue: Secondary | ICD-10-CM

## 2017-07-21 DIAGNOSIS — K5900 Constipation, unspecified: Secondary | ICD-10-CM

## 2017-07-21 MED ORDER — GABAPENTIN 300 MG PO CAPS: 300.0000 mg | ORAL_CAPSULE | Freq: Every day | ORAL | 3 refills | Status: DC

## 2017-07-21 MED ORDER — METOPROLOL SUCCINATE ER 100 MG PO TB24: 100.0000 mg | ORAL_TABLET | Freq: Every morning | ORAL | 3 refills | Status: AC

## 2017-07-21 MED ORDER — FUROSEMIDE 40 MG PO TABS: 40.0000 mg | ORAL_TABLET | Freq: Every day | ORAL | 3 refills | Status: DC

## 2017-07-21 MED ORDER — CLONIDINE HCL 0.3 MG/24HR TD PTWK: 0.3000 mg | MEDICATED_PATCH | TRANSDERMAL | 3 refills | Status: AC

## 2017-07-21 MED ORDER — GABAPENTIN 100 MG PO CAPS: 100.0000 mg | ORAL_CAPSULE | Freq: Two times a day (BID) | ORAL | 2 refills | Status: AC

## 2017-07-21 MED ORDER — INSULIN GLARGINE 100 UNIT/ML SOLOSTAR PEN: PEN_INJECTOR | SUBCUTANEOUS | 6 refills | Status: AC

## 2017-07-21 MED ORDER — INSULIN PEN NEEDLE 32G X 4 MM MISC: 6 refills | Status: AC

## 2017-07-21 NOTE — Assessment & Plan Note (Addendum)
BP Readings from Last 3 Encounters:  07/21/17 (!) 176/65  07/05/17 135/69  06/29/17 136/73  BP elevated today. Patient states that she ran out of her clonidine patch 1 week ago, which I believe is the reason for her elevated. Current regimen includes: Toprol 100 mg, Lasix 40 mg, and 0.3 mg clonidine patch weekly.   Plan: Due to the patient's current emotional state will defer changes until next visit and will reassess next visit.  -Continue Toprol 100 mg, Lasix 40 mg, and 0.3 mg clonidine patch weekly.

## 2017-07-21 NOTE — Assessment & Plan Note (Signed)
A1c 8.5 in 04/2017, which is increased from previous A1C of 6.2. The patient has not been checking her BGs and admits to eating sweets, such as ice cream more frequently the past 2.5 months because of life stressors. Her current regimen includes: 45 units of Lantus at bedtime, Januvia 50 mg daily. She reports daily compliance with medications.   Plan: -Deferred changes in diabetes regimen due to patients emotional states -RTC in 1 month for A1C and discussion about current regimen -Encourage BG monitoring next visit

## 2017-07-21 NOTE — Assessment & Plan Note (Signed)
Patient's brother passed away several days ago, which she feels has exacerbated her depression symptoms. Denies suicidal thoughts. Discussed the importance of follow up with her therapist and psychiatrist, but told her she can always make an appointment in clinic to discuss life stressors and depression symptoms.

## 2017-07-21 NOTE — Progress Notes (Signed)
   CC: Type 2 diabetes, hypertension  HPI:  [name] is a 66 y.o. female with past medical history as documented below presenting for Type 2 diabetes and hypertension follow up. Unfortunately, [name] had a recent death in the family several days ago, her brother passed away. She states that this loss has been very hard because her and her brother were very close. She has been feeling very overwhelmed and is not interested in focusing on her chronic medical conditions today, which is very understandable. She got very tearful discussing her recent life stressors, she feels a lot of pressure on her to now take care of her mother and other family members. She denies suicidal ideation, but endorses fatigue. Please see encounter based charting for a more in depth description of her chronic medical problems.  Past Medical History:  Diagnosis Date  . Anemia   . Arthritis   . Borderline personality disorder   . CAD (coronary artery disease)    Catherization 11/18/09>nonobstructive CAD , normal LV function, EF 65%  . Cervicalgia   . Chronic kidney disease (CKD), stage IV (severe) (HCC)   . Dental caries    periodontitis and mandibular tori  . Depression   . Diabetes mellitus   . Diabetes mellitus, type II (HCC)   . Diabetic gastroparesis (HCC)   . Diastolic CHF (HCC)    Grade I, on Echo 06/2013, EF 55-60%  . Dyspnea   . Fibromyalgia   . GERD (gastroesophageal reflux disease)   . Gout   . Headache(784.0)   . Health maintenance examination    Colonoscopy 2009> normal; Diabetic eye exam 12/2008. No diabetic retinopathy; Mammogram 11/10> No evidence of malignancy, DXA 03/14/13 : normal  . History of hiatal hernia   . Hyperlipidemia   . Hypertension   . IBS (irritable bowel syndrome)   . Iliotibial band syndrome   . Low back pain syndrome   . Nonorganic psychosis   . Peripheral neuropathy   . Right carpal tunnel syndrome 09/13/2014  . SBO (small bowel obstruction) (HCC) 01/09/2013  .  Sleep apnea    does not wear CPAP   . Suicide attempt [facility])    " by overdose of whatever I had"  . Vitamin B12 deficiency   . Vitamin D deficiency   . Wears dentures    upper  . Wears glasses   . Wears glasses    Review of Systems:   Review of Systems  Constitutional: Positive for malaise/fatigue. Negative for chills, fever and weight loss.  Cardiovascular: Negative for chest pain.  Gastrointestinal: Positive for constipation. Negative for diarrhea, nausea and vomiting.  Psychiatric/Behavioral: Positive for depression. Negative for suicidal ideas. The patient is nervous/anxious.     Physical Exam:  Vitals:   07/21/17 1318 07/21/17 1432  BP: (!) 182/70 (!) 176/65  Pulse: 78 70  Temp: 99.2 F (37.3 C)   TempSrc: Oral   SpO2: 98%   Weight: 193 lb 3.2 oz (87.6 kg)   Height:  (1.651 m)    General: Sitting comfortably, NAD, sad appearing HEENT: [place]/AT, EOMI, no scleral icterus Cardiac: RRR, No R/M/G appreciated Pulm: normal effort, CTAB Ext: extremities well perfused, no peripheral edema Neuro: alert and oriented X3, cranial nerves II-XII grossly intact Psych: depressed mood, tearful   Assessment & Plan:   See Encounters Tab for problem based charting.  Patient seen with Dr. Sandre Kitty

## 2017-07-22 NOTE — Progress Notes (Signed)
Internal Medicine Clinic Attending  I saw and evaluated the patient.  I personally confirmed the key portions of the history and exam documented by Dr. Minda Meo and I reviewed pertinent patient test results.  The assessment, diagnosis, and plan were formulated together and I agree with the documentation in the resident's note.  I expressed appreciation for her coming into her appointment today, despite her brother's funeral earlier in the day. We deferred further changes to her medication plan given her situation.  Anne Shutter, MD

## 2017-07-25 DIAGNOSIS — N183 Chronic kidney disease, stage 3 (moderate): Secondary | ICD-10-CM | POA: Diagnosis not present

## 2017-07-25 DIAGNOSIS — D631 Anemia in chronic kidney disease: Secondary | ICD-10-CM | POA: Diagnosis not present

## 2017-08-03 ENCOUNTER — Ambulatory Visit (INDEPENDENT_AMBULATORY_CARE_PROVIDER_SITE_OTHER): Payer: Medicare Other | Admitting: Psychiatry

## 2017-08-03 ENCOUNTER — Encounter (HOSPITAL_COMMUNITY): Payer: Self-pay | Admitting: Psychiatry

## 2017-08-03 VITALS — BP 132/80 | HR 70 | Ht 65.0 in | Wt 198.0 lb

## 2017-08-03 DIAGNOSIS — Z87891 Personal history of nicotine dependence: Secondary | ICD-10-CM | POA: Diagnosis not present

## 2017-08-03 DIAGNOSIS — Z6372 Alcoholism and drug addiction in family: Secondary | ICD-10-CM | POA: Diagnosis not present

## 2017-08-03 DIAGNOSIS — F330 Major depressive disorder, recurrent, mild: Secondary | ICD-10-CM | POA: Diagnosis not present

## 2017-08-03 DIAGNOSIS — Z79899 Other long term (current) drug therapy: Secondary | ICD-10-CM | POA: Diagnosis not present

## 2017-08-03 DIAGNOSIS — Z634 Disappearance and death of family member: Secondary | ICD-10-CM | POA: Diagnosis not present

## 2017-08-03 DIAGNOSIS — G8929 Other chronic pain: Secondary | ICD-10-CM | POA: Diagnosis not present

## 2017-08-03 DIAGNOSIS — M549 Dorsalgia, unspecified: Secondary | ICD-10-CM | POA: Diagnosis not present

## 2017-08-03 DIAGNOSIS — Z818 Family history of other mental and behavioral disorders: Secondary | ICD-10-CM | POA: Diagnosis not present

## 2017-08-03 DIAGNOSIS — F321 Major depressive disorder, single episode, moderate: Secondary | ICD-10-CM

## 2017-08-03 MED ORDER — LORAZEPAM 0.5 MG PO TABS: 0.5000 mg | ORAL_TABLET | Freq: Two times a day (BID) | ORAL | 3 refills | Status: DC

## 2017-08-03 MED ORDER — SERTRALINE HCL 100 MG PO TABS: 200.0000 mg | ORAL_TABLET | Freq: Every day | ORAL | 8 refills | Status: DC

## 2017-08-03 MED ORDER — BUPROPION HCL ER (XL) 150 MG PO TB24: 450.0000 mg | ORAL_TABLET | Freq: Every day | ORAL | 1 refills | Status: DC

## 2017-08-03 NOTE — Progress Notes (Signed)
Patient ID: Alianny Toelle, female   DOB: June 10, 1951, 66 y.o.   MRN: [id number] [facility] MD Progress Note  08/03/2017 2:12 PM Yosselyn Tax  MRN:  [id number] Subjective:   Better  Happy Principal Problem: major depression , recurrent , mild Diagnosis:   Major depression, recurrent, mild   Today the patient is not doing well. She had the sudden death of her brother from an MI. He was only 61 years old. She is very close to. Since that she's felt very depressed. He was only 2 weeks ago. She actually seems to be sleeping fairly well but she's eating excessively. Her energy level is low but is always low. The patient denies any problems thinking concentrating she feels somewhat worthless. She denies being suicidal. She is a number of other stresses. This includes her only mother who lives behind her was slowly beginning to fail. Patient husband's continues to drink regularly. A month or so ago was the anniversary of the patient's daughter's death. She now she feels of the death of her brother is remembering a lot about her daughter. The patient is still able to enjoy things. She is not psychotic in any way. She's trying to function. Unfortunately she never got to start back into therapy. She says she feels very anxious. She can't sit still. She can focus on anything because she's trembling and anxious. The patient does not use any alcohol. He is no history of abuse of any nature.   Past Psychiatric History:   Past Medical History:  Past Medical History:  Diagnosis Date  . Anemia   . Arthritis   . Borderline personality disorder (HCC)   . CAD (coronary artery disease)    Catherization 11/18/09>nonobstructive CAD , normal LV function, EF 65%  . Cervicalgia   . Chronic kidney disease (CKD), stage IV (severe) (HCC)   . Dental caries    periodontitis and mandibular tori  . Depression   . Diabetes mellitus   . Diabetes mellitus, type II (HCC)   . Diabetic gastroparesis (HCC)   . Diastolic CHF (HCC)     Grade I, on Echo 06/2013, EF 55-60%  . Dyspnea   . Fibromyalgia   . GERD (gastroesophageal reflux disease)   . Gout   . Headache(784.0)   . Health maintenance examination    Colonoscopy 2009> normal; Diabetic eye exam 12/2008. No diabetic retinopathy; Mammogram 11/10> No evidence of malignancy, DXA 03/14/13 : normal  . History of hiatal hernia   . Hyperlipidemia   . Hypertension   . IBS (irritable bowel syndrome)   . Iliotibial band syndrome   . Low back pain syndrome   . Nonorganic psychosis (HCC)   . Peripheral neuropathy   . Right carpal tunnel syndrome 09/13/2014  . SBO (small bowel obstruction) (HCC) 01/09/2013  . Sleep apnea    does not wear CPAP   . Suicide attempt [facility])    " by overdose of whatever I had"  . Vitamin B12 deficiency   . Vitamin D deficiency   . Wears dentures    upper  . Wears glasses   . Wears glasses     Past Surgical History:  Procedure Laterality Date  . ABDOMINAL HYSTERECTOMY    . APPENDECTOMY    . BOWEL RESECTION N/A 01/10/2013   Procedure: SMALL BOWEL RESECTION;  Surgeon: Lodema Pilot, DO;  [facility];  Service: General;  Laterality: N/A;  . CARPAL TUNNEL RELEASE Right 09/13/2014   Procedure: RIGHT WRIST CARPAL TUNNEL RELEASE;  Surgeon: Ivin Booty  Robie Ridge, MD;  Location: [facility];  Service: Orthopedics;  Laterality: Right;  . CARPAL TUNNEL RELEASE Left 02/28/2015   Procedure: LEFT CARPAL TUNNEL RELEASE;  Surgeon: Teryl Lucy, MD;  Location: [facility];  Service: Orthopedics;  Laterality: Left;  . CATARACT EXTRACTION W/ INTRAOCULAR LENS  IMPLANT, BILATERAL    . COLONOSCOPY    . HAND SURGERY  1992   rt  . HERNIA REPAIR  03/01/14   Lap incisional hernia repair w/mesh  . INCISIONAL HERNIA REPAIR N/A 03/01/2014   Procedure: LAPAROSCOPIC INCISIONAL HERNIA;  Surgeon: Axel Filler, MD;  [facility];  Service: General;  Laterality: N/A;  . INSERTION OF MESH N/A 03/01/2014   Procedure: INSERTION OF MESH;  Surgeon:  Axel Filler, MD;  [facility];  Service: General;  Laterality: N/A;  . LAPAROTOMY N/A 01/10/2013   Procedure: EXPLORATORY LAPAROTOMY;  Surgeon: Lodema Pilot, DO;  [facility];  Service: General;  Laterality: N/A;  . LYSIS OF ADHESION N/A 01/10/2013   Procedure: LYSIS OF ADHESION;  Surgeon: Lodema Pilot, DO;  [facility];  Service: General;  Laterality: N/A;  . MULTIPLE TOOTH EXTRACTIONS    . STERIOD INJECTION Left 09/13/2014   Procedure: STEROID INJECTION LEFT HAND;  Surgeon: Eulas Post, MD;  Location:  SURGERY CENTER;  Service: Orthopedics;  Laterality: Left;  . TOOTH EXTRACTION Bilateral 05/13/2017   Procedure: DENTAL RESTORATION/EXTRACTIONS;  Surgeon: Ocie Doyne, DDS;  [facility];  Service: Oral Surgery;  Laterality: Bilateral;   Family History:  Family History  Problem Relation Age of Onset  . Diabetes Mother   . Hypertension Mother   . Hyperlipidemia Mother   . Arthritis Mother   . Depression Mother   . Heart disease Father   . Diabetes Brother   . Heart disease Brother   . Diabetes Brother   . Heart disease Paternal Grandmother    Family Psychiatric  History:  Social History:  History  Alcohol Use No     History  Drug Use No    Social History   Social History  . Marital status: Married    Spouse name: N/A  . Number of children: N/A  . Years of education: N/A   Social History Main Topics  . Smoking status: Former Smoker    Types: Cigarettes    Quit date: 10/25/2002  . Smokeless tobacco: Never Used  . Alcohol use No  . Drug use: No  . Sexual activity: Not Currently   Other Topics Concern  . None   Social History Narrative  . None   Additional Social History:                         Sleep: Good  Appetite:  Good  Current Medications: Current Outpatient Prescriptions  Medication Sig Dispense Refill  . ACCU-CHEK AVIVA PLUS test strip TEST UPTO 4 TIMES DAILY 125 each 5  . ACCU-CHEK FASTCLIX LANCETS MISC  TEST five times a day 102 each 11  . acetaminophen (TYLENOL) 325 MG tablet Take 650 mg by mouth 3 (three) times daily as needed for mild pain or headache.    . AMITIZA 24 MCG capsule Take 24 mcg by mouth 2 (two) times daily.     Marland Kitchen aspirin EC 81 MG tablet Take 81 mg by mouth daily.    . Biotin 5000 MCG TABS Take 5,000 mcg by mouth daily.    Marland Kitchen buPROPion (WELLBUTRIN XL) 150 MG 24  hr tablet Take 3 tablets (450 mg total) by mouth daily. 270 tablet 1  . cloNIDine (CATAPRES - DOSED IN MG/24 HR) 0.3 mg/24hr patch Place 1 patch (0.3 mg total) onto the skin once a week. 12 patch 3  . dexlansoprazole (DEXILANT) 60 MG capsule Take 1 capsule (60 mg total) by mouth daily. For reflux 30 capsule   . furosemide (LASIX) 40 MG tablet Take 1 tablet (40 mg total) by mouth daily. 90 tablet 3  . gabapentin (NEURONTIN) 100 MG capsule Take 1 capsule (100 mg total) by mouth 2 (two) times daily. 60 capsule 2  . gabapentin (NEURONTIN) 300 MG capsule Take 1 capsule (300 mg total) by mouth at bedtime. 90 capsule 3  . Insulin Glargine (LANTUS SOLOSTAR) 100 UNIT/ML Solostar Pen inject subcutaneously 45 units at bedtime 30 mL 6  . Insulin Pen Needle (BD PEN NEEDLE NANO U/F) 32G X 4 MM MISC Use to inject insulin into the skin 100 each 6  . JANUVIA 50 MG tablet take 1 tablet by mouth once daily 90 tablet 4  . LINZESS 145 MCG CAPS capsule Take 145 mcg by mouth daily as needed (consitpation).   1  . metoprolol succinate (TOPROL-XL) 100 MG 24 hr tablet Take 1 tablet (100 mg total) by mouth every morning. Take with or immediately following a meal. 90 tablet 3  . Misc Natural Products (TART CHERRY ADVANCED PO) Take 1 capsule by mouth every evening.    . promethazine (PHENERGAN) 25 MG tablet Take 25 mg by mouth 2 (two) times daily as needed for nausea or vomiting.     . rosuvastatin (CRESTOR) 10 MG tablet Take 1 tablet (10 mg total) by mouth daily. 30 tablet 11  . sertraline (ZOLOFT) 100 MG tablet Take 2 tablets (200 mg total) by mouth  at bedtime. 100 tablet 8  . TURMERIC CURCUMIN PO Take 1 tablet by mouth at bedtime.    . Vitamin D, Ergocalciferol, (DRISDOL) 50000 units CAPS capsule take 1 capsule by mouth every week 12 capsule 0  . LORazepam (ATIVAN) 0.5 MG tablet Take 1 tablet (0.5 mg total) by mouth 2 (two) times daily. 60 tablet 3   No current facility-administered medications for this visit.     Lab Results: No results found. However, due to the size of the patient record, not all encounters were searched. Please check Results Review for a complete set of results.  Physical Findings: AIMS:  , ,  ,  ,    CIWA:    COWS:     Musculoskeletal: Strength & Muscle Tone: within normal limits Gait & Station: normal Patient leans: N/A  Psychiatric Specialty Exam: ROS  Blood pressure 132/80, pulse 70, height 5\' 5"  (1.651 m), weight 198 lb (89.8 kg).Body mass index is 32.95 kg/m.  General Appearance: Casual  Eye Contact::  Good  Speech:  Clear and Coherent  Volume:  Normal  Mood:  Euthymic  Affect:  Congruent  Thought Process:  Coherent  Orientation:  Full (Time, Place, and Person)  Thought Content:  WDL  Suicidal Thoughts:  No  Homicidal Thoughts:  No  Memory:  NA  Judgement:  Good  Insight:  Good  Psychomotor Activity:  Normal  Concentration:  Good  Recall:  Good  Fund of Knowledge:Good  Language: Good  Akathisia:  No  Handed:  Right  AIMS (if indicated):     Assets:  Desire for Improvement  ADL's:  Intact  Cognition: WNL  Sleep:      Treatment  Plan Summary: 08/03/2017, 2:12 PM   Today the patient is doing well. Her mood is stable. She denies any neurological symptoms or any physical complaints other than chronic back pain the patient takes 200 mg of Zoloft 450 mg Wellbutrin and does well. At this time the patient is not in therapy. Her #1 problem therefore is major clinical depression is well-controlled with Zoloft and Wellbutrin. Her other problem is her alcohol dependent husband who seems to be  drinking just a little bit less. Her last problem is chronic pain and she presently is actively involved in care for this condition. This patient to return to see me in 5 months..Patient ID: Netta Fodge, female   DOB: May 03, 1951, 66 y.o.   MRN: [id number] [facility] MD Progress Note  08/03/2017 2:09 PM Ashleah Valtierra  MRN:  [id number] Subjective:   Better  Happy Principal Problem: major depression , recurrent , mild Diagnosis:   Major depression, recurrent, mild    Past Psychiatric History:   Past Medical History:  Past Medical History:  Diagnosis Date  . Anemia   . Arthritis   . Borderline personality disorder (HCC)   . CAD (coronary artery disease)    Catherization 11/18/09>nonobstructive CAD , normal LV function, EF 65%  . Cervicalgia   . Chronic kidney disease (CKD), stage IV (severe) (HCC)   . Dental caries    periodontitis and mandibular tori  . Depression   . Diabetes mellitus   . Diabetes mellitus, type II (HCC)   . Diabetic gastroparesis (HCC)   . Diastolic CHF (HCC)    Grade I, on Echo 06/2013, EF 55-60%  . Dyspnea   . Fibromyalgia   . GERD (gastroesophageal reflux disease)   . Gout   . Headache(784.0)   . Health maintenance examination    Colonoscopy 2009> normal; Diabetic eye exam 12/2008. No diabetic retinopathy; Mammogram 11/10> No evidence of malignancy, DXA 03/14/13 : normal  . History of hiatal hernia   . Hyperlipidemia   . Hypertension   . IBS (irritable bowel syndrome)   . Iliotibial band syndrome   . Low back pain syndrome   . Nonorganic psychosis (HCC)   . Peripheral neuropathy   . Right carpal tunnel syndrome 09/13/2014  . SBO (small bowel obstruction) (HCC) 01/09/2013  . Sleep apnea    does not wear CPAP   . Suicide attempt [facility])    " by overdose of whatever I had"  . Vitamin B12 deficiency   . Vitamin D deficiency   . Wears dentures    upper  . Wears glasses   . Wears glasses     Past Surgical History:  Procedure Laterality Date  . ABDOMINAL  HYSTERECTOMY    . APPENDECTOMY    . BOWEL RESECTION N/A 01/10/2013   Procedure: SMALL BOWEL RESECTION;  Surgeon: Lodema Pilot, DO;  [facility];  Service: General;  Laterality: N/A;  . CARPAL TUNNEL RELEASE Right 09/13/2014   Procedure: RIGHT WRIST CARPAL TUNNEL RELEASE;  Surgeon: Eulas Post, MD;  Location: [facility];  Service: Orthopedics;  Laterality: Right;  . CARPAL TUNNEL RELEASE Left 02/28/2015   Procedure: LEFT CARPAL TUNNEL RELEASE;  Surgeon: Teryl Lucy, MD;  Location:  SURGERY CENTER;  Service: Orthopedics;  Laterality: Left;  . CATARACT EXTRACTION W/ INTRAOCULAR LENS  IMPLANT, BILATERAL    . COLONOSCOPY    . HAND SURGERY  1992   rt  . HERNIA REPAIR  03/01/14   Lap incisional hernia repair w/mesh  .  INCISIONAL HERNIA REPAIR N/A 03/01/2014   Procedure: LAPAROSCOPIC INCISIONAL HERNIA;  Surgeon: Axel Filler, MD;  [facility];  Service: General;  Laterality: N/A;  . INSERTION OF MESH N/A 03/01/2014   Procedure: INSERTION OF MESH;  Surgeon: Axel Filler, MD;  [facility];  Service: General;  Laterality: N/A;  . LAPAROTOMY N/A 01/10/2013   Procedure: EXPLORATORY LAPAROTOMY;  Surgeon: Lodema Pilot, DO;  [facility];  Service: General;  Laterality: N/A;  . LYSIS OF ADHESION N/A 01/10/2013   Procedure: LYSIS OF ADHESION;  Surgeon: Lodema Pilot, DO;  [facility];  Service: General;  Laterality: N/A;  . MULTIPLE TOOTH EXTRACTIONS    . STERIOD INJECTION Left 09/13/2014   Procedure: STEROID INJECTION LEFT HAND;  Surgeon: Eulas Post, MD;  Location: [facility];  Service: Orthopedics;  Laterality: Left;  . TOOTH EXTRACTION Bilateral 05/13/2017   Procedure: DENTAL RESTORATION/EXTRACTIONS;  Surgeon: Ocie Doyne, DDS;  Location: [facility] OR;  Service: Oral Surgery;  Laterality: Bilateral;   Family History:  Family History  Problem Relation Age of Onset  . Diabetes Mother   . Hypertension Mother   . Hyperlipidemia Mother   .  Arthritis Mother   . Depression Mother   . Heart disease Father   . Diabetes Brother   . Heart disease Brother   . Diabetes Brother   . Heart disease Paternal Grandmother    Family Psychiatric  History:  Social History:  History  Alcohol Use No     History  Drug Use No    Social History   Social History  . Marital status: Married    Spouse name: N/A  . Number of children: N/A  . Years of education: N/A   Social History Main Topics  . Smoking status: Former Smoker    Types: Cigarettes    Quit date: 10/25/2002  . Smokeless tobacco: Never Used  . Alcohol use No  . Drug use: No  . Sexual activity: Not Currently   Other Topics Concern  . None   Social History Narrative  . None   Additional Social History:                         Sleep: Good  Appetite:  Good  Current Medications: Current Outpatient Prescriptions  Medication Sig Dispense Refill  . ACCU-CHEK AVIVA PLUS test strip TEST UPTO 4 TIMES DAILY 125 each 5  . ACCU-CHEK FASTCLIX LANCETS MISC TEST five times a day 102 each 11  . acetaminophen (TYLENOL) 325 MG tablet Take 650 mg by mouth 3 (three) times daily as needed for mild pain or headache.    . AMITIZA 24 MCG capsule Take 24 mcg by mouth 2 (two) times daily.     Marland Kitchen aspirin EC 81 MG tablet Take 81 mg by mouth daily.    . Biotin 5000 MCG TABS Take 5,000 mcg by mouth daily.    Marland Kitchen buPROPion (WELLBUTRIN XL) 150 MG 24 hr tablet Take 3 tablets (450 mg total) by mouth daily. 270 tablet 1  . cloNIDine (CATAPRES - DOSED IN MG/24 HR) 0.3 mg/24hr patch Place 1 patch (0.3 mg total) onto the skin once a week. 12 patch 3  . dexlansoprazole (DEXILANT) 60 MG capsule Take 1 capsule (60 mg total) by mouth daily. For reflux 30 capsule   . furosemide (LASIX) 40 MG tablet Take 1 tablet (40 mg total) by mouth daily. 90 tablet 3  . gabapentin (NEURONTIN) 100 MG capsule  Take 1 capsule (100 mg total) by mouth 2 (two) times daily. 60 capsule 2  . gabapentin (NEURONTIN) 300  MG capsule Take 1 capsule (300 mg total) by mouth at bedtime. 90 capsule 3  . Insulin Glargine (LANTUS SOLOSTAR) 100 UNIT/ML Solostar Pen inject subcutaneously 45 units at bedtime 30 mL 6  . Insulin Pen Needle (BD PEN NEEDLE NANO U/F) 32G X 4 MM MISC Use to inject insulin into the skin 100 each 6  . JANUVIA 50 MG tablet take 1 tablet by mouth once daily 90 tablet 4  . LINZESS 145 MCG CAPS capsule Take 145 mcg by mouth daily as needed (consitpation).   1  . metoprolol succinate (TOPROL-XL) 100 MG 24 hr tablet Take 1 tablet (100 mg total) by mouth every morning. Take with or immediately following a meal. 90 tablet 3  . Misc Natural Products (TART CHERRY ADVANCED PO) Take 1 capsule by mouth every evening.    . promethazine (PHENERGAN) 25 MG tablet Take 25 mg by mouth 2 (two) times daily as needed for nausea or vomiting.     . rosuvastatin (CRESTOR) 10 MG tablet Take 1 tablet (10 mg total) by mouth daily. 30 tablet 11  . sertraline (ZOLOFT) 100 MG tablet Take 2 tablets (200 mg total) by mouth at bedtime. 100 tablet 8  . TURMERIC CURCUMIN PO Take 1 tablet by mouth at bedtime.    . Vitamin D, Ergocalciferol, (DRISDOL) 50000 units CAPS capsule take 1 capsule by mouth every week 12 capsule 0  . LORazepam (ATIVAN) 0.5 MG tablet Take 1 tablet (0.5 mg total) by mouth 2 (two) times daily. 60 tablet 3   No current facility-administered medications for this visit.     Lab Results: No results found. However, due to the size of the patient record, not all encounters were searched. Please check Results Review for a complete set of results.  Physical Findings: AIMS:  , ,  ,  ,    CIWA:    COWS:     Musculoskeletal: Strength & Muscle Tone: within normal limits Gait & Station: normal Patient leans: N/A  Psychiatric Specialty Exam: ROS  Blood pressure 132/80, pulse 70, height  (1.651 m), weight 198 lb (89.8 kg).Body mass index is 32.95 kg/m.  General Appearance: Casual  Eye Contact::  Good   Speech:  Clear and Coherent  Volume:  Normal  Mood:  Euthymic  Affect:  Congruent  Thought Process:  Coherent  Orientation:  Full (Time, Place, and Person)  Thought Content:  WDL  Suicidal Thoughts:  No  Homicidal Thoughts:  No  Memory:  NA  Judgement:  Good  Insight:  Good  Psychomotor Activity:  Normal  Concentration:  Good  Recall:  Good  Fund of Knowledge:Good  Language: Good  Akathisia:  No  Handed:  Right  AIMS (if indicated):     Assets:  Desire for Improvement  ADL's:  Intact  Cognition: WNL  Sleep:      Treatment Plan Summary: 08/03/2017, 2:09 PM   At this time she'll rest the patient continue taking her Zoloft 200 mg continue taking Wellbutrin 450 mg. Today we'll go ahead and begin her on Ativan 0.5 mg twice a day. More importantly will get her an appointment with a new therapist in the setting. Patient is going through a lot of grieving. He's only been deceased her brother for 2 weeks. 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 This brings up a lot of  other family dynamics mainly related to her sister. This patient to return to see me in 6-7 weeks. The patient denies any physical complaints at this time. She does not smoke.

## 2017-08-04 ENCOUNTER — Encounter: Payer: Self-pay | Admitting: Internal Medicine

## 2017-08-08 ENCOUNTER — Ambulatory Visit (INDEPENDENT_AMBULATORY_CARE_PROVIDER_SITE_OTHER): Payer: Medicare Other | Admitting: Neurology

## 2017-08-08 DIAGNOSIS — I10 Essential (primary) hypertension: Secondary | ICD-10-CM

## 2017-08-08 DIAGNOSIS — N184 Chronic kidney disease, stage 4 (severe): Secondary | ICD-10-CM

## 2017-08-08 DIAGNOSIS — E1122 Type 2 diabetes mellitus with diabetic chronic kidney disease: Secondary | ICD-10-CM

## 2017-08-08 DIAGNOSIS — F32A Depression, unspecified: Secondary | ICD-10-CM

## 2017-08-08 DIAGNOSIS — G4719 Other hypersomnia: Secondary | ICD-10-CM

## 2017-08-08 DIAGNOSIS — R351 Nocturia: Secondary | ICD-10-CM

## 2017-08-08 DIAGNOSIS — G4733 Obstructive sleep apnea (adult) (pediatric): Secondary | ICD-10-CM | POA: Diagnosis not present

## 2017-08-08 DIAGNOSIS — F329 Major depressive disorder, single episode, unspecified: Secondary | ICD-10-CM

## 2017-08-08 DIAGNOSIS — R5383 Other fatigue: Secondary | ICD-10-CM

## 2017-08-08 DIAGNOSIS — Z9989 Dependence on other enabling machines and devices: Principal | ICD-10-CM

## 2017-08-10 ENCOUNTER — Other Ambulatory Visit (HOSPITAL_COMMUNITY): Payer: Self-pay | Admitting: *Deleted

## 2017-08-11 ENCOUNTER — Telehealth: Payer: Self-pay | Admitting: Neurology

## 2017-08-11 ENCOUNTER — Ambulatory Visit (HOSPITAL_COMMUNITY)
Admission: RE | Admit: 2017-08-11 | Discharge: 2017-08-11 | Disposition: A | Discharge status: Home / Self Care | Payer: Medicare Other | Source: Ambulatory Visit | Attending: Nephrology | Admitting: Nephrology

## 2017-08-11 DIAGNOSIS — N189 Chronic kidney disease, unspecified: Secondary | ICD-10-CM | POA: Insufficient documentation

## 2017-08-11 DIAGNOSIS — D631 Anemia in chronic kidney disease: Secondary | ICD-10-CM | POA: Diagnosis not present

## 2017-08-11 MED ORDER — SODIUM CHLORIDE 0.9 % IV SOLN
510.0000 mg | INTRAVENOUS | Status: DC
  Administered 2017-08-11: 510 mg via INTRAVENOUS
  Filled 2017-08-11: qty 17

## 2017-08-11 NOTE — Discharge Instructions (Signed)

## 2017-08-11 NOTE — Telephone Encounter (Signed)
Called to discuss sleep study results. I explained to the patient that she was found to have sleep apnea both central and obstructive sleep apnea. I explained what both meant. I informed the patient that the sleep lab will contact her to get set up with a CPAP titration study. Pt verbalized understanding and had no further questions.

## 2017-08-11 NOTE — Telephone Encounter (Signed)
-----   Message from Melvyn Novasarmen Dohmeier, MD sent at 08/11/2017  8:33 AM EDT ----- moderate - severe Complex Sleep Apnea with 82%  obstructive events, 18% centrals.  There is evidence of prolonged nocturnal hypoxemia and  tachycardia, reflecting a response to respiratory stressors. RECOMMENDATION: I strongly recommend a return for an attended  titration study, given the listed co-morbidities.

## 2017-08-11 NOTE — Addendum Note (Signed)
Addended by: Melvyn NovasHMEIER, Chaquana Nichols on: 08/11/2017 08:33 AM   Modules accepted: Orders

## 2017-08-11 NOTE — Procedures (Signed)
[facility] [address] [place], [place] [address] NAME:     [name]                                                        DOB: 1951/04/12 MEDICAL RECORD NUMBER [id number]                                     DOS: 08/08/17 REFERRING PHYSICIAN: Magnus SinningSamatha LaCroce, MD STUDY PERFORMED: Home Sleep Study HISTORY: [name], a 66 year old African-American right-handed female, presents with an extensive past medical history. She is referred for a complaint of fatigue. Stating that she continues to feel tired, drained of energy and has little motivation to leave [name] house and has lost interest in many activities she used to enjoy. She has significant depression symptoms -this also culminates around [name] anniversary of her daughter's death 2 years ago. CKD 4, HTN, Nocturia, Obesity.  Dr. Minda MeoLaCroce further reports that [name] was at one time diagnosed with sleep apnea but does not wear CPAP. [name] reports that she had her first sleep study many years ago and was placed on CPAP, but she could not tolerate [name] mask and her husband could not tolerate [name] noise of [name] machine at night. [name] then had a repeat sleep study in March 2014 or thereabouts but was told that [name] study was not valid. She did not sleep enough. At this time she is not even sure if she has apnea. She now sleeps alone and sleeps by herself.  Epworth score 8/24, Fatigue severity score 63, depression score 11/15 ( high) , BMI 32.6          STUDY RESULTS: Total Recording:  9 hours 41 minutes Total Apnea/Hypopnea Index (AHI): 31.1 /hour - RDI was [name] same as AHI. Average Oxygen Saturation: 92 %; Lowest Oxygen Saturation: 65 %  Time Oxygen Saturation below 89%: 103 minutes = 21 % Average Heart Rate: 68 bpm (between 55 and 237- artefact suspected) IMPRESSION: moderate - severe Complex Sleep Apnea with 82% obstructive events, 18% centrals.  There is evidence of prolonged nocturnal hypoxemia  and tachycardia, reflecting a response to respiratory stressors. RECOMMENDATION: I strongly recommend a return for an attended titration study.   I certify that I have reviewed [name] raw data recording prior to [name] issuance of this report in accordance with [name] standards of [name] American Academy of Sleep Medicine (AASM). Melvyn Novasarmen Ardyn Forge, MD   08-11-2017 Medical Director of [facility], accredited by [name] AASM Diplomat of [name] [employer] and Neu[employer] and [employer] Board of Sleep Medicine

## 2017-08-17 ENCOUNTER — Encounter: Payer: Self-pay | Admitting: Rehabilitative and Restorative Service Providers"

## 2017-08-17 NOTE — Therapy (Signed)
[facility] [address] [place], [place], [address] Phone: [phone]   Fax:  [phone]  Patient Details  Name: [name] MRN: [id number] Date of Birth: [birth year] Referring Provider:  No ref. provider found  Encounter Date: last encounter 06/02/17  PHYSICAL THERAPY DISCHARGE SUMMARY  Visits from Start of Care: 2  Current functional level related to goals / functional outcomes: See initial evaluation--patient cancelled visits due to not being able to attend regularly because of other issues she was dealing with.  See eval for patient status.   Remaining deficits: See evaluation 05/27/17   Education / Equipment: Home program.  Plan: Patient agrees to discharge.  Patient goals were not met. Patient is being discharged due to not returning since the last visit.  ?????          Thank you for the referral of this patient. Rudell Cobb, MPT  Texie Tupou 08/17/2017, 12:03 PM  [facility] [address] [place], [place], [address] Phone: [phone]   Fax:  [phone]

## 2017-08-18 ENCOUNTER — Ambulatory Visit (HOSPITAL_COMMUNITY)
Admission: RE | Admit: 2017-08-18 | Discharge: 2017-08-18 | Disposition: A | Discharge status: Home / Self Care | Payer: Medicare Other | Source: Ambulatory Visit | Attending: Nephrology | Admitting: Nephrology

## 2017-08-18 DIAGNOSIS — N189 Chronic kidney disease, unspecified: Secondary | ICD-10-CM | POA: Insufficient documentation

## 2017-08-18 DIAGNOSIS — D631 Anemia in chronic kidney disease: Secondary | ICD-10-CM | POA: Diagnosis not present

## 2017-08-18 MED ORDER — SODIUM CHLORIDE 0.9 % IV SOLN
510.0000 mg | INTRAVENOUS | Status: AC
  Administered 2017-08-18: 510 mg via INTRAVENOUS
  Filled 2017-08-18: qty 17

## 2017-08-22 ENCOUNTER — Other Ambulatory Visit: Payer: Self-pay | Admitting: Family Medicine

## 2017-08-25 ENCOUNTER — Encounter (HOSPITAL_COMMUNITY): Payer: Self-pay | Admitting: Licensed Clinical Social Worker

## 2017-08-25 ENCOUNTER — Ambulatory Visit (INDEPENDENT_AMBULATORY_CARE_PROVIDER_SITE_OTHER): Payer: Medicare Other | Admitting: Licensed Clinical Social Worker

## 2017-08-25 DIAGNOSIS — F331 Major depressive disorder, recurrent, moderate: Secondary | ICD-10-CM | POA: Diagnosis not present

## 2017-08-25 DIAGNOSIS — F411 Generalized anxiety disorder: Secondary | ICD-10-CM
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
## 2017-09-07 ENCOUNTER — Ambulatory Visit (INDEPENDENT_AMBULATORY_CARE_PROVIDER_SITE_OTHER): Payer: Medicare Other | Admitting: Neurology

## 2017-09-07 DIAGNOSIS — R5383 Other fatigue: Secondary | ICD-10-CM | POA: Diagnosis not present

## 2017-09-07 DIAGNOSIS — E1122 Type 2 diabetes mellitus with diabetic chronic kidney disease: Secondary | ICD-10-CM

## 2017-09-07 DIAGNOSIS — G4733 Obstructive sleep apnea (adult) (pediatric): Secondary | ICD-10-CM

## 2017-09-07 DIAGNOSIS — F329 Major depressive disorder, single episode, unspecified: Secondary | ICD-10-CM

## 2017-09-07 DIAGNOSIS — I10 Essential (primary) hypertension: Secondary | ICD-10-CM

## 2017-09-07 DIAGNOSIS — G4719 Other hypersomnia: Secondary | ICD-10-CM

## 2017-09-07 DIAGNOSIS — R351 Nocturia: Secondary | ICD-10-CM

## 2017-09-07 DIAGNOSIS — N184 Chronic kidney disease, stage 4 (severe): Secondary | ICD-10-CM

## 2017-09-12 ENCOUNTER — Telehealth: Payer: Self-pay | Admitting: Neurology

## 2017-09-12 ENCOUNTER — Other Ambulatory Visit: Payer: Self-pay | Admitting: Neurology

## 2017-09-12 DIAGNOSIS — N183 Chronic kidney disease, stage 3 unspecified: Secondary | ICD-10-CM

## 2017-09-12 DIAGNOSIS — I2510 Atherosclerotic heart disease of native coronary artery without angina pectoris: Secondary | ICD-10-CM

## 2017-09-12 DIAGNOSIS — G4731 Primary central sleep apnea: Secondary | ICD-10-CM

## 2017-09-12 DIAGNOSIS — E1165 Type 2 diabetes mellitus with hyperglycemia: Secondary | ICD-10-CM

## 2017-09-12 NOTE — Telephone Encounter (Signed)
PATIENT'S NAME:  [name], [name] DOB:      10/12/1952      MRN#:    [id number]     DATE OF RECORDING: 09/07/2017 REFERRING M.D.:  Samantha Lacroce, M.D. Study Performed:   CPAP  Titration HISTORY:  [name] is a 66 year old female Patient returning for CPAP titration following her HST from  08-08-2017, which resulted in an AHI of 31.1/hr. with complex sleep apnea, and prolonged oxygen desaturation of 103 minutes, 21 % of the recorded time, Nadir SpO2 of 65%. Tachycardia was noted. The patient has a diagnosis of CKD, severe fatigue, HTN, Nocturia, IBS and is obese. The patient endorsed the Fatigue Score at 63/63 points.  The patient's weight 196 pounds with a height of 65 (inches), resulting in a BMI of 32.7 kg/m2.The patient's neck circumference measured 15 inches.  CURRENT MEDICATIONS: Tylenol, Amitiza, Aspirin, Wellbutrin, Clonidine, Dexilant, Neurontin, Insulin, Januvia, Linzess, Toprol, Phenergan, Crestor, Zoloft,  PROCEDURE:  This is a multichannel digital polysomnogram utilizing the SomnoStar 11.2 system.  Electrodes and sensors were applied and monitored per AASM Specifications.   EEG, EOG, Chin and Limb EMG, were sampled at 200 Hz.  ECG, Snore and Nasal Pressure, Thermal Airflow, Respiratory Effort, CPAP Flow and Pressure, Oximetry was sampled at 50 Hz. Digital video and audio were recorded.      CPAP was initiated at 5 cmH20 with heated humidity per AASM split night standards and pressure was advanced to 7 cmH20 because of hypopneas, apneas and desaturations. CPAP failed to alleviate apnea and did not improve oxygen saturation. The patient was changed to BiPAP and finally BiPAP ST at  12/8 cm with ST 12/min., reaching  no resolution of apnea either.  At a ASV-PAP pressure of 15/5 cmH20 with 3 cm pressure support , there was a reduction of the AHI to 4.6 with 84% sleep efficiency, and improvement of RDI and oxygen nadir along apnea.    Lights Out was at 21:13 and Lights On at 05:14. Total  recording time (TRT) was 481.5 minutes, with a total sleep time (TST) of 275 minutes. The patient's sleep latency was 60 minutes with 0 minutes of wake time after sleep onset. REM latency was 274.5 minutes.  The sleep efficiency was 57.1 %.    SLEEP ARCHITECTURE: WASO (Wake after sleep onset) was 146.5 minutes.  There were 38.5 minutes in Stage N1, 100.5 minutes Stage N2, 72 minutes Stage N3 and 64 minutes in Stage REM.  The percentage of Stage N1 was 14.%, Stage N2 was 36.5%, Stage N3 was 26.2% and Stage R (REM sleep) was 23.3%.   RESPIRATORY ANALYSIS:  There was a total of 22 respiratory events: 0 obstructive apneas, 18 central apneas and 0 mixed apneas with a total of 18 apneas and an apnea index (AI) of 3.9 /hour. There were 4 hypopneas with a hypopnea index of .9/hour. The patient also had 0 respiratory event related arousals (RERAs).     The total APNEA/HYPOPNEA INDEX (AHI) was 4.8 /hour and the total RESPIRATORY DISTURBANCE INDEX was 4.8 .hour  20 events occurred in REM sleep and 2 events in NREM. The REM AHI was 18.8 /hour versus a non-REM AHI of .6 /hour.  The patient spent 128.5 minutes of total sleep time in the supine position and 147 minutes in non-supine. The supine AHI was 9.3, versus a non-supine AHI of 0.8.  OXYGEN SATURATION & C02:  The baseline 02 saturation was 96%, with the lowest being 81%. Time spent below 89% saturation equaled   23 minutes.  PERIODIC LIMB MOVEMENTS:   The patient had a total of 0 Periodic Limb Movements. The arousals were noted as: 55 were spontaneous, 0 were associated with PLMs, and 3 were associated with respiratory events. Audio and video analysis did not show any abnormal or unusual movements, behaviors, phonations or vocalizations.  The patient took one bathroom break. EKG was in keeping with normal sinus rhythm (NSR). Post-study, the patient indicated that sleep was better than usual.   DIAGNOSIS: Complex Sleep Apnea, REM accentuated- and unresponsive to  CPAP, BiPAP and BiPAP ST as treatment emergent central apneas persisted. Change to ASV with FFM was needed and helped to significantly reduce the AHI but did not alleviate apnea completely nor raised the nadir significantly.   PLANS/RECOMMENDATIONS: The patient was fitted with a Respironics Dream wear FFM small size on ASV therapy set to maximum pressure support of 15 cm water, minimum pressure support of 5 cm water with 3 cm EPA Pressure support, and heated humidity. I will order this machine immediately and hope to see the patient in 60-90 days for a follow up appointment. [facility].   Please call [phone] with any questions.     I certify that I have reviewed the entire raw data recording prior to the issuance of this report in accordance with the Standards of Accreditation of the American Academy of Sleep Medicine (AASM)    Melvyn Novasarmen Toniette Devera, M.D.   09-12-2017  Diplomat, American Board of Psychiatry and Neurology  Diplomat, American Board of Sleep Medicine Medical Director, [facility]

## 2017-09-12 NOTE — Procedures (Signed)
PATIENT'S NAME:  [name], [name] DOB:      10/12/1952      MRN#:    [id number]     DATE OF RECORDING: 09/07/2017 REFERRING M.D.:  Landis MartinsSamantha Lacroce, M.D. Study Performed:   CPAP  Titration HISTORY:  [name] is a 66 year old female Patient returning for CPAP titration following her HST from  08-08-2017, which resulted in an AHI of 31.1/hr. with complex sleep apnea, and prolonged oxygen desaturation of 103 minutes, 21 % of the recorded time, Nadir SpO2 of 65%. Tachycardia was noted. The patient has a diagnosis of CKD, severe fatigue, HTN, Nocturia, IBS and is obese. The patient endorsed the Fatigue Score at 63/63 points.  The patient's weight 196 pounds with a height of 65 (inches), resulting in a BMI of 32.7 kg/m2.The patient's neck circumference measured 15 inches.  CURRENT MEDICATIONS: Tylenol, Amitiza, Aspirin, Wellbutrin, Clonidine, Dexilant, Neurontin, Insulin, Januvia, Linzess, Toprol, Phenergan, Crestor, Zoloft,  PROCEDURE:  This is a multichannel digital polysomnogram utilizing the SomnoStar 11.2 system.  Electrodes and sensors were applied and monitored per AASM Specifications.   EEG, EOG, Chin and Limb EMG, were sampled at 200 Hz.  ECG, Snore and Nasal Pressure, Thermal Airflow, Respiratory Effort, CPAP Flow and Pressure, Oximetry was sampled at 50 Hz. Digital video and audio were recorded.      CPAP was initiated at 5 cmH20 with heated humidity per AASM split night standards and pressure was advanced to 7 cmH20 because of hypopneas, apneas and desaturations. CPAP failed to alleviate apnea and did not improve oxygen saturation. The patient was changed to BiPAP and finally BiPAP ST at  12/8 cm with ST 12/min., reaching  no resolution of apnea either.  At a ASV-PAP pressure of 15/5 cmH20 with 3 cm pressure support , there was a reduction of the AHI to 4.6 with 84% sleep efficiency, and improvement of RDI and oxygen nadir along apnea.    Lights Out was at 21:13 and Lights On at 05:14. Total  recording time (TRT) was 481.5 minutes, with a total sleep time (TST) of 275 minutes. The patient's sleep latency was 60 minutes with 0 minutes of wake time after sleep onset. REM latency was 274.5 minutes.  The sleep efficiency was 57.1 %.    SLEEP ARCHITECTURE: WASO (Wake after sleep onset) was 146.5 minutes.  There were 38.5 minutes in Stage N1, 100.5 minutes Stage N2, 72 minutes Stage N3 and 64 minutes in Stage REM.  The percentage of Stage N1 was 14.%, Stage N2 was 36.5%, Stage N3 was 26.2% and Stage R (REM sleep) was 23.3%.   RESPIRATORY ANALYSIS:  There was a total of 22 respiratory events: 0 obstructive apneas, 18 central apneas and 0 mixed apneas with a total of 18 apneas and an apnea index (AI) of 3.9 /hour. There were 4 hypopneas with a hypopnea index of .9/hour. The patient also had 0 respiratory event related arousals (RERAs).     The total APNEA/HYPOPNEA INDEX (AHI) was 4.8 /hour and the total RESPIRATORY DISTURBANCE INDEX was 4.8 .hour  20 events occurred in REM sleep and 2 events in NREM. The REM AHI was 18.8 /hour versus a non-REM AHI of .6 /hour.  The patient spent 128.5 minutes of total sleep time in the supine position and 147 minutes in non-supine. The supine AHI was 9.3, versus a non-supine AHI of 0.8.  OXYGEN SATURATION & C02:  The baseline 02 saturation was 96%, with the lowest being 81%. Time spent below 89% saturation equaled  23 minutes.  PERIODIC LIMB MOVEMENTS:   The patient had a total of 0 Periodic Limb Movements. The arousals were noted as: 55 were spontaneous, 0 were associated with PLMs, and 3 were associated with respiratory events. Audio and video analysis did not show any abnormal or unusual movements, behaviors, phonations or vocalizations.  The patient took one bathroom break. EKG was in keeping with normal sinus rhythm (NSR). Post-study, the patient indicated that sleep was better than usual.   DIAGNOSIS: Complex Sleep Apnea, REM accentuated- and unresponsive to  CPAP, BiPAP and BiPAP ST as treatment emergent central apneas persisted. Change to ASV with FFM was needed and helped to significantly reduce the AHI but did not alleviate apnea completely nor raised the nadir significantly.   PLANS/RECOMMENDATIONS: The patient was fitted with a Respironics Dream wear FFM small size on ASV therapy set to maximum pressure support of 15 cm water, minimum pressure support of 5 cm water with 3 cm EPA Pressure support, and heated humidity. I will order this machine immediately and hope to see the patient in 60-90 days for a follow up appointment. This will be [facility].   Please call [phone] with any questions.     I certify that I have reviewed the entire raw data recording prior to the issuance of this report in accordance with the Standards of Accreditation of the American Academy of Sleep Medicine (AASM)    Melvyn Novasarmen Kristion Holifield, M.D.   09-12-2017  Diplomat, American Board of Psychiatry and Neurology  Diplomat, American Board of Sleep Medicine Medical Director, [facility]

## 2017-09-13 NOTE — Telephone Encounter (Signed)
I called [name]. I advised [name] that Dr. Vickey Hugerohmeier reviewed their sleep study results and found that [name] has sleep apnea. Dr. Vickey Hugerohmeier recommends that [name] starts ASV. I reviewed PAP compliance expectations with the [name]. [name] is agreeable to starting an ASV. I advised [name] that an order will be sent to a DME, [facility], and [facility] will call the [name] within about one week after they file with the [name]'s insurance. [facility] will show the [name] how to use the machine, fit for masks, and troubleshoot the ASV if needed. A follow up appt was made for insurance purposes with Darrol Angelarolyn Martin, NP on  Feb 7,2019 at 10:45 am. [name] verbalized understanding to arrive 15 minutes early and bring their ASV. A letter with all of this information in it will be mailed to the [name] as a reminder. I verified with the [name] that the address we have on file is correct. [name] verbalized understanding of results. [name] had no questions at this time but was encouraged to call back if questions arise.

## 2017-09-20 ENCOUNTER — Encounter: Payer: Self-pay | Admitting: Neurology

## 2017-09-20 ENCOUNTER — Ambulatory Visit (INDEPENDENT_AMBULATORY_CARE_PROVIDER_SITE_OTHER): Payer: Medicare Other | Admitting: Neurology

## 2017-09-20 VITALS — BP 136/78 | HR 88 | Ht 65.0 in | Wt 194.0 lb

## 2017-09-20 DIAGNOSIS — G4731 Primary central sleep apnea: Secondary | ICD-10-CM | POA: Diagnosis not present

## 2017-09-20 DIAGNOSIS — R51 Headache: Secondary | ICD-10-CM

## 2017-09-20 DIAGNOSIS — E118 Type 2 diabetes mellitus with unspecified complications: Secondary | ICD-10-CM

## 2017-09-20 DIAGNOSIS — Z794 Long term (current) use of insulin: Secondary | ICD-10-CM

## 2017-09-20 DIAGNOSIS — G4486 Cervicogenic headache: Secondary | ICD-10-CM

## 2017-09-20 MED ORDER — GABAPENTIN 300 MG PO CAPS: ORAL_CAPSULE | ORAL | 3 refills | Status: AC

## 2017-09-20 NOTE — Progress Notes (Signed)
NEUROLOGY FOLLOW UP OFFICE NOTE  [name] [id number] 23-Jun-1951  HISTORY OF PRESENT ILLNESS: I had the pleasure of seeing [name] in follow-up in the neurology clinic on 09/20/2017.  The patient was last seen 5 months ago for new onset headaches. Records and images were personally reviewed where available. I personally reviewed MRI brain without contrast done 04/2017 which did not show any acute changes, pituitary diminutive and stable. Her ESR and CRP were slightly elevated. Since her last visit, she has also seen sleep specialist Dr. Brett Fairy and found to have sleep apnea, she is awaiting CPAP. She continues to have headaches daily, mostly in the back of her head, with associated neck pain. She takes Tylenol on a daily basis. She sees PMR specialist Dr. Tamala Julian for her back pain and has received injections and gabapentin '100mg'$ , '100mg'$ , '300mg'$  qhs. She reports a couple of falls, last fall was a week ago she fell on the floor, no loss of consciousness or significant injuries. She started doing PT for cervicogenic headaches, but had to stop because of a death in the family last month. She is alos reporting "the dropsies," where she would be holding something then all of a sudden it drops out of either hand. No numbness/tingling. This has been ongoing sice April, she denies any hand weakness but her whole body feels weak. She has received iron infusions with Renal.   HPI 04/11/2017: This is a 66 yo RH woman with a history of hypertension, hyperlipidemia, diabetes, low back pain, CKD, CAD, who presented for evaluation of worsening headaches. She reports having "regular" headaches on occasion through they years, but over the past 6 months, she has had a different type of constant sharp shooting pain over the bilateral temporal regions, as well as tightness in the occipital region radiating down her neck. Sometimes the pain is throbbing over her forehead, with associated photo/phonophobia. She has a  history of nausea and denies any worsening with the headaches. She reports neck pain also started 6 months ago. She feels tight in the upper neck, radiating down her shoulders. Pain worsens over the course of the day, it is a constant 2/10 that goes up to an 8/10. No visual obscurations or vision loss. No tinnitus. She takes 1-2 Tylenol around 4 times a week. She denies any focal numbness/tingling/weakness. She has low back pain and was started on gabapentin by Dr. Tamala Julian, with plans for epidural injections. No side effects on gabapentin. No family history of headaches.   PAST MEDICAL HISTORY: Past Medical History:  Diagnosis Date  . Anemia   . Arthritis   . Borderline personality disorder ([place])   . CAD (coronary artery disease)    Catherization 11/18/09>nonobstructive CAD , normal LV function, EF 65%  . Cervicalgia   . Chronic kidney disease (CKD), stage IV (severe) ([place])   . Dental caries    periodontitis and mandibular tori  . Depression   . Diabetes mellitus   . Diabetes mellitus, type II ([place])   . Diabetic gastroparesis ([place])   . Diastolic CHF ([place])    Grade I, on Echo 06/2013, EF 55-60%  . Dyspnea   . Fibromyalgia   . GERD (gastroesophageal reflux disease)   . Gout   . Headache(784.0)   . Health maintenance examination    Colonoscopy 2009> normal; Diabetic eye exam 12/2008. No diabetic retinopathy; Mammogram 11/10> No evidence of malignancy, DXA 03/14/13 : normal  . History of hiatal hernia   . Hyperlipidemia   .  Hypertension   . IBS (irritable bowel syndrome)   . Iliotibial band syndrome   . Low back pain syndrome   . 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 Peripheral neuropathy   . Right carpal tunnel syndrome 09/13/2014  . SBO (small bowel obstruction) ([place]) 01/09/2013  . Sleep apnea    does not wear CPAP   . Suicide attempt [facility])    " by overdose of whatever I had"  . Vitamin B12 deficiency   . Vitamin D deficiency   . Wears dentures    upper  . Wears glasses   . Wears  glasses     MEDICATIONS: Current Outpatient Medications on File Prior to Visit  Medication Sig Dispense Refill  . ACCU-CHEK AVIVA PLUS test strip TEST UPTO 4 TIMES DAILY 125 each 5  . ACCU-CHEK FASTCLIX LANCETS MISC TEST five times a day 102 each 11  . acetaminophen (TYLENOL) 325 MG tablet Take 650 mg by mouth 3 (three) times daily as needed for mild pain or headache.    . AMITIZA 24 MCG capsule Take 24 mcg by mouth 2 (two) times daily.     Marland Kitchen aspirin EC 81 MG tablet Take 81 mg by mouth daily.    . Biotin 5000 MCG TABS Take 5,000 mcg by mouth daily.    Marland Kitchen buPROPion (WELLBUTRIN XL) 150 MG 24 hr tablet Take 3 tablets (450 mg total) by mouth daily. 270 tablet 1  . cloNIDine (CATAPRES - DOSED IN MG/24 HR) 0.3 mg/24hr patch Place 1 patch (0.3 mg total) onto the skin once a week. 12 patch 3  . dexlansoprazole (DEXILANT) 60 MG capsule Take 1 capsule (60 mg total) by mouth daily. For reflux 30 capsule   . furosemide (LASIX) 40 MG tablet Take 1 tablet (40 mg total) by mouth daily. 90 tablet 3  . gabapentin (NEURONTIN) 100 MG capsule Take 1 capsule (100 mg total) by mouth 2 (two) times daily. 60 capsule 2  . gabapentin (NEURONTIN) 300 MG capsule Take 1 capsule (300 mg total) by mouth at bedtime. 90 capsule 3  . Insulin Glargine (LANTUS SOLOSTAR) 100 UNIT/ML Solostar Pen inject subcutaneously 45 units at bedtime 30 mL 6  . Insulin Pen Needle (BD PEN NEEDLE NANO U/F) 32G X 4 MM MISC Use to inject insulin into the skin 100 each 6  . JANUVIA 50 MG tablet take 1 tablet by mouth once daily 90 tablet 4  . LINZESS 145 MCG CAPS capsule Take 145 mcg by mouth daily as needed (consitpation).   1  . LORazepam (ATIVAN) 0.5 MG tablet Take 1 tablet (0.5 mg total) by mouth 2 (two) times daily. 60 tablet 3  . metoprolol succinate (TOPROL-XL) 100 MG 24 hr tablet Take 1 tablet (100 mg total) by mouth every morning. Take with or immediately following a meal. 90 tablet 3  . Misc Natural Products (TART CHERRY ADVANCED PO) Take  1 capsule by mouth every evening.    . promethazine (PHENERGAN) 25 MG tablet Take 25 mg by mouth 2 (two) times daily as needed for nausea or vomiting.     . rosuvastatin (CRESTOR) 10 MG tablet Take 1 tablet (10 mg total) by mouth daily. 30 tablet 11  . sertraline (ZOLOFT) 100 MG tablet Take 2 tablets (200 mg total) by mouth at bedtime. 100 tablet 8  . TURMERIC CURCUMIN PO Take 1 tablet by mouth at bedtime.    . Vitamin D, Ergocalciferol, (DRISDOL) 50000 units CAPS capsule take 1 capsule by mouth every week  12 capsule 0   No current facility-administered medications on file prior to visit.     ALLERGIES: Allergies  Allergen Reactions  . Penicillins Hives, Itching and Other (See Comments)    Gets delusional and gets hives  Has patient had a PCN reaction causing immediate rash, facial/tongue/throat swelling, SOB or lightheadedness with hypotension: Yes Has patient had a PCN reaction causing severe rash involving mucus membranes or skin necrosis: No Has patient had a PCN reaction that required hospitalization No Has patient had a PCN reaction occurring within the last 10 years: No If all of the above answers are "NO", then may proceed with Cephalosporins  . Cimetidine Other (See Comments)    Tagamet-Breast swelling  . Indomethacin Diarrhea    Caused severe diarrhea with episodes of incontinance  . Adhesive [Tape] Other (See Comments)    Causes bruises Okay to use paper tape only  . Sulfamethoxazole Other (See Comments)    Unknown reaction     FAMILY HISTORY: Family History  Problem Relation Age of Onset  . Diabetes Mother   . Hypertension Mother   . Hyperlipidemia Mother   . Arthritis Mother   . Depression Mother   . Heart disease Father   . Diabetes Brother   . Heart disease Brother   . Diabetes Brother   . Heart disease Paternal Grandmother     SOCIAL HISTORY: Social History   Socioeconomic History  . Marital status: Married    Spouse name: Not on file  . Number of  children: Not on file  . Years of education: Not on file  . Highest education level: Not on file  Social Needs  . Financial resource strain: Not on file  . Food insecurity - worry: Not on file  . Food insecurity - inability: Not on file  . Transportation needs - medical: Not on file  . Transportation needs - non-medical: Not on file  Occupational History  . Not on file  Tobacco Use  . Smoking status: Former Smoker    Types: Cigarettes    Last attempt to quit: 10/25/2002    Years since quitting: 14.9  . Smokeless tobacco: Never Used  Substance and Sexual Activity  . Alcohol use: No    Alcohol/week: 0.0 oz  . Drug use: No  . Sexual activity: Not Currently  Other Topics Concern  . Not on file  Social History Narrative  . Not on file    REVIEW OF SYSTEMS: Constitutional: No fevers, chills, or sweats, no generalized fatigue, change in appetite Eyes: No visual changes, double vision, eye pain Ear, nose and throat: No hearing loss, ear pain, nasal congestion, sore throat Cardiovascular: No chest pain, palpitations Respiratory:  No shortness of breath at rest or with exertion, wheezes GastrointestinaI: No nausea, vomiting, diarrhea, abdominal pain, fecal incontinence Genitourinary:  No dysuria, urinary retention or frequency Musculoskeletal:  + neck pain, back pain Integumentary: No rash, pruritus, skin lesions Neurological: as above Psychiatric: No depression, insomnia, anxiety Endocrine: No palpitations, fatigue, diaphoresis, mood swings, change in appetite, change in weight, increased thirst Hematologic/Lymphatic:  No anemia, purpura, petechiae. Allergic/Immunologic: no itchy/runny eyes, nasal congestion, recent allergic reactions, rashes  PHYSICAL EXAM: Vitals:   09/20/17 0819  BP: 136/78  Pulse: 88  SpO2: 96%   General: No acute distress Head:  Normocephalic/atraumatic Neck: supple, no paraspinal tenderness, full range of motion Heart:  Regular rate and rhythm Lungs:   Clear to auscultation bilaterally Back: No paraspinal tenderness Skin/Extremities: No rash, no edema Neurological Exam:  alert and oriented to person, place, and time. No aphasia or dysarthria. Fund of knowledge is appropriate.  Recent and remote memory are intact.  Attention and concentration are normal.    Able to name objects and repeat phrases. Cranial nerves: Pupils equal, round, reactive to light.  Fundoscopic exam unremarkable, no papilledema. Extraocular movements intact with no nystagmus. Visual fields full. Facial sensation intact. No facial asymmetry. Tongue, uvula, palate midline.  Motor: Bulk and tone normal, muscle strength 5/5 throughout with no pronator drift.  Sensation to light touch intact.  No extinction to double simultaneous stimulation.  Deep tendon reflexes 2+ throughout, toes downgoing.  Finger to nose testing intact.  Gait narrow-based and steady, able to tandem walk adequately.  Romberg negative.  IMPRESSION: This is a 66 yo RH woman with a history of  hypertension, hyperlipidemia, diabetes, low back pain, CKD, CAD, with worsening headaches. MRI brain unremarkable. Headaches likely cervicogenic in etiology. She has been unable to continue PT due to family issues. Increase gabapentin to 100,100,'600mg'$ . She sees Dr. Tamala Julian for the back pain and will discuss neck pain and hand symptoms with him as well. Treatment of sleep apnea may help with overall fatigue and potentially headaches as well, proceed with CPAP as planned. She will follow-up in 5 months and knows to call for any changes  Thank you for allowing me to participate in her care.  Please do not hesitate to call for any questions or concerns.  The duration of this appointment visit was 25 minutes of face-to-face time with the patient.  Greater than 50% of this time was spent in counseling, explanation of diagnosis, planning of further management, and coordination of care.   Ellouise Newer, M.D.   CC: Dr. Aggie Hacker, Dr.  Tamala Julian

## 2017-09-20 NOTE — Patient Instructions (Signed)
1. Increase gabapentin night dose to 300mg  2 caps at night. Continue on same dose during the daytime 2. Proceed with CPAP for sleep apnea 3. Go back to physical therapy when ready, helping with neck pain will help with the headaches 4. Schedule follow-up with Dr. Katrinka BlazingSmith and let him know about the neck pain and dropsies as well 5. Follow-up in 5 months, call for any changes

## 2017-09-28 ENCOUNTER — Ambulatory Visit (INDEPENDENT_AMBULATORY_CARE_PROVIDER_SITE_OTHER): Payer: Medicare Other | Admitting: Internal Medicine

## 2017-09-28 ENCOUNTER — Encounter: Payer: Self-pay | Admitting: Internal Medicine

## 2017-09-28 ENCOUNTER — Other Ambulatory Visit: Payer: Self-pay

## 2017-09-28 VITALS — BP 121/75 | HR 74 | Temp 97.5°F | Ht 65.0 in | Wt 196.8 lb

## 2017-09-28 DIAGNOSIS — E119 Type 2 diabetes mellitus without complications: Secondary | ICD-10-CM | POA: Diagnosis not present

## 2017-09-28 DIAGNOSIS — K5900 Constipation, unspecified: Secondary | ICD-10-CM

## 2017-09-28 DIAGNOSIS — Z9111 Patient's noncompliance with dietary regimen: Secondary | ICD-10-CM

## 2017-09-28 DIAGNOSIS — R110 Nausea: Secondary | ICD-10-CM | POA: Diagnosis not present

## 2017-09-28 DIAGNOSIS — Z87891 Personal history of nicotine dependence: Secondary | ICD-10-CM | POA: Diagnosis not present

## 2017-09-28 DIAGNOSIS — IMO0002 Reserved for concepts with insufficient information to code with codable children: Secondary | ICD-10-CM

## 2017-09-28 DIAGNOSIS — F329 Major depressive disorder, single episode, unspecified: Secondary | ICD-10-CM | POA: Diagnosis not present

## 2017-09-28 DIAGNOSIS — Z794 Long term (current) use of insulin: Secondary | ICD-10-CM | POA: Diagnosis not present

## 2017-09-28 DIAGNOSIS — I10 Essential (primary) hypertension: Secondary | ICD-10-CM

## 2017-09-28 DIAGNOSIS — Z79899 Other long term (current) drug therapy: Secondary | ICD-10-CM | POA: Diagnosis not present

## 2017-09-28 DIAGNOSIS — M2550 Pain in unspecified joint: Secondary | ICD-10-CM | POA: Diagnosis not present

## 2017-09-28 DIAGNOSIS — G4733 Obstructive sleep apnea (adult) (pediatric): Secondary | ICD-10-CM

## 2017-09-28 DIAGNOSIS — Z1231 Encounter for screening mammogram for malignant neoplasm of breast: Secondary | ICD-10-CM

## 2017-09-28 DIAGNOSIS — F331 Major depressive disorder, recurrent, moderate: Secondary | ICD-10-CM

## 2017-09-28 DIAGNOSIS — G4730 Sleep apnea, unspecified: Secondary | ICD-10-CM

## 2017-09-28 DIAGNOSIS — E1165 Type 2 diabetes mellitus with hyperglycemia: Secondary | ICD-10-CM

## 2017-09-28 DIAGNOSIS — Z0000 Encounter for general adult medical examination without abnormal findings: Secondary | ICD-10-CM

## 2017-09-28 LAB — POCT GLYCOSYLATED HEMOGLOBIN (HGB A1C): Hemoglobin A1C: 9.6

## 2017-09-28 LAB — GLUCOSE, CAPILLARY: Glucose-Capillary: 250 mg/dL — ABNORMAL HIGH (ref 65–99)

## 2017-09-28 MED ORDER — FUROSEMIDE 40 MG PO TABS: 40.0000 mg | ORAL_TABLET | Freq: Every day | ORAL | 1 refills | Status: AC

## 2017-09-28 MED ORDER — SITAGLIPTIN PHOSPHATE 50 MG PO TABS: 50.0000 mg | ORAL_TABLET | Freq: Every day | ORAL | 1 refills | Status: AC

## 2017-09-28 NOTE — Progress Notes (Signed)
   CC: type 2 diabetes, hypertension  HPI:  [name] is a 66 y.o. female with past medical history as documented below presenting for follow up of type 2 diabetes and hypertension. Please see encounter based charting for a more in depth description of the patient's chronic medical problems.   Past Medical History:  Diagnosis Date  . Anemia   . Arthritis   . Borderline personality disorder ([facility])   . CAD (coronary artery disease)    Catherization 11/18/09>nonobstructive CAD , normal LV function, EF 65%  . Cervicalgia   . Chronic kidney disease (CKD), stage IV (severe) ([facility])   . Dental caries    periodontitis and mandibular tori  . Depression   . Diabetes mellitus   . Diabetes mellitus, type II ([facility])   . Diabetic gastroparesis ([facility])   . Diastolic CHF ([facility])    Grade I, on Echo 06/2013, EF 55-60%  . Dyspnea   . Fibromyalgia   . GERD (gastroesophageal reflux disease)   . Gout   . Headache(784.0)   . Health maintenance examination    Colonoscopy 2009> normal; Diabetic eye exam 12/2008. No diabetic retinopathy; Mammogram 11/10> No evidence of malignancy, DXA 03/14/13 : normal  . History of hiatal hernia   . Hyperlipidemia   . Hypertension   . IBS (irritable bowel syndrome)   . Iliotibial band syndrome   . Low back pain syndrome   . Nonorganic psychosis ([facility])   . Peripheral neuropathy   . Right carpal tunnel syndrome 09/13/2014  . SBO (small bowel obstruction) ([facility]) 01/09/2013  . Sleep apnea    does not wear CPAP   . Suicide attempt [facility])    " by overdose of whatever I had"  . Vitamin B12 deficiency   . Vitamin D deficiency   . Wears dentures    upper  . Wears glasses   . Wears glasses    Review of Systems:   Review of Systems  Constitutional: Positive for malaise/fatigue.  Respiratory: Negative for shortness of breath and wheezing.   Cardiovascular: Negative for chest pain and leg swelling.  Gastrointestinal: Positive for constipation and nausea. Negative for  abdominal pain, heartburn and vomiting.  Genitourinary: Negative for dysuria and hematuria.  Musculoskeletal: Positive for joint pain.  Psychiatric/Behavioral: Negative for suicidal ideas. The patient has insomnia.     Physical Exam:  Vitals:   09/28/17 1421  BP: (!) 152/61  Pulse: 78  Temp: (!) 97.5 F (36.4 C)  TempSrc: Oral  SpO2: 98%  Weight: 196 lb 12.8 oz (89.3 kg)  Height: 5\' 5"  (1.651 m)   Physical Exam  Constitutional: She is oriented to person, place, and time. She appears well-developed and well-nourished. No distress.  HENT:  Mouth/Throat: Oropharynx is clear and moist.  Eyes: Conjunctivae and EOM are normal.  Neck: Normal range of motion. Neck supple.  Cardiovascular: Normal rate, regular rhythm and normal heart sounds.  Pulmonary/Chest: Effort normal and breath sounds normal.  Abdominal: Soft. Bowel sounds are normal. There is no tenderness.  Musculoskeletal: Normal range of motion. She exhibits no edema.  Lymphadenopathy:    She has no cervical adenopathy.  Neurological: She is alert and oriented to person, place, and time. No cranial nerve deficit.  Skin: Skin is warm and dry.  Psychiatric: She has a normal mood and affect. Her behavior is normal.    Assessment & Plan:   See Encounters Tab for problem based charting.  Patient seen with Dr. Josem KaufmannKlima

## 2017-09-28 NOTE — Patient Instructions (Signed)
[name],   It was a pleasure seeing you again.   Your hemoglobin A1c, which is a marker for diabetes control and blood sugars continues to increase. It is 9.6 at this visit. This is most likely due to your diet and eating sweets. I have referred you to our diabetes educator and nutritional specialist. I have made no changes to your medications. Please try to check you blood sugars at least twice daily, preferably before meals.   I have amde no changes to your blood pressure medications as this is well controlled.   I have sent in a order for a screening mammogram. [facility] your mamm[facility]. Phone #: [phone]   FOLLOW-UP INSTRUCTIONS When: 2 months For: Diabetes follow up What to bring: Blood glucose monitor with blood sugars

## 2017-09-29 ENCOUNTER — Encounter (HOSPITAL_COMMUNITY): Payer: Self-pay | Admitting: Licensed Clinical Social Worker

## 2017-09-29 ENCOUNTER — Ambulatory Visit (INDEPENDENT_AMBULATORY_CARE_PROVIDER_SITE_OTHER): Payer: Medicare Other | Admitting: Licensed Clinical Social Worker

## 2017-09-29 DIAGNOSIS — F331 Major depressive disorder, recurrent, moderate: Secondary | ICD-10-CM | POA: Diagnosis not present

## 2017-09-29 DIAGNOSIS — F411 Generalized anxiety disorder: Secondary | ICD-10-CM
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
## 2017-09-29 NOTE — Assessment & Plan Note (Signed)
Mood seems much improved from last visit but the patient states she still feels depressed, fatigued, and does not sleep much. She denies suicidal ideation at this time but does admit to feeling low several days ago and the "thought crossed her mind." She denied having a plan. She sees a psychiatrist who manages her antidepressants. The patient does not feel that her medications are helping. I suggested expressing these concernss to her psychiatrist. She has also started attending therapy again with a therapist. She is scheduled to see both her therapist and pyschiatrist this week.

## 2017-09-29 NOTE — Assessment & Plan Note (Signed)
Declines flu vaccination. Order placed for screening mammography.

## 2017-09-29 NOTE — Assessment & Plan Note (Signed)
BP Readings from Last 3 Encounters:  09/28/17 121/75  09/20/17 136/78  08/18/17 (!) 173/58   Patient's BP well controlled on current regimen which includes: clonidine 0.3 mg, toprol xl 100 mg, and lasix 40 mg.   Plan: -Continue current treatment regimen

## 2017-09-29 NOTE — Assessment & Plan Note (Signed)
Patient underwent home sleep test which showed Moderate - severe Complex Sleep Apnea with 82% obstructive events, 18% centrals, with evidence of prolonged nocturnal hypoxemia and  tachycardia, reflecting a response to respiratory stressors. The patient also underwent CPAP titration and Per Dr. Vickey Hugerohmeier: Complex Sleep Apnea, REM accentuated- and unresponsive to CPAP, BiPAP and BiPAP ST as treatment emergent central apneas persisted.Change to ASV with FFM was needed and helped to significantly reduce the AHI but did not alleviate apnea completely nor raised the nadir significantly.   ASV machine was ordered. Per patient she has not received yet because it is currently getting authorized through her insurance. Once patient starts using this I believe her hypertension and uncontrolled type 2 diabetes will begin to improve.

## 2017-09-29 NOTE — Assessment & Plan Note (Addendum)
A1C 9.6, increased from 8.5. Patient reports compliance with current medication regimen but admits to not monitoring her diet. She states she has been eating a lot of ice cream, cake, pie, and junk food. She has turned to food to help comfort her through stressful times. I discussed with the patient that her A1c reflects these dietary changes and just several months ago she was very well controlled. I do not think adjusting her diabetes medications at this time will significantly improve her A1C. She is currently on Januvia 50 mg (renal adjusted dose) and lantus 45 units, which was her same regimen in March 2018 when her A1C was 6.2. At this time treating her underlying issues such as depression and anxiety, as well as providing her education and resources to better manage her diet will be most beneficial.   Plan: -Continue current treatment regimen -Referral to [facility]ore[facility] Newonna Pyler for diabetes education and nutritional counseling -RTC in 1-2 months for follow up

## 2017-09-30 ENCOUNTER — Encounter (HOSPITAL_COMMUNITY): Payer: Self-pay | Admitting: Psychiatry

## 2017-09-30 ENCOUNTER — Other Ambulatory Visit: Payer: Self-pay

## 2017-09-30 ENCOUNTER — Ambulatory Visit (INDEPENDENT_AMBULATORY_CARE_PROVIDER_SITE_OTHER): Payer: Medicare Other | Admitting: Psychiatry

## 2017-09-30 VITALS — BP 94/60 | HR 69 | Wt 196.8 lb

## 2017-09-30 DIAGNOSIS — Z87891 Personal history of nicotine dependence: Secondary | ICD-10-CM

## 2017-09-30 DIAGNOSIS — Z818 Family history of other mental and behavioral disorders: Secondary | ICD-10-CM

## 2017-09-30 DIAGNOSIS — F324 Major depressive disorder, single episode, in partial remission: Secondary | ICD-10-CM

## 2017-09-30 MED ORDER — LORAZEPAM 0.5 MG PO TABS: ORAL_TABLET | ORAL | 3 refills | Status: AC

## 2017-09-30 MED ORDER — BUPROPION HCL ER (XL) 150 MG PO TB24: 450.0000 mg | ORAL_TABLET | Freq: Every day | ORAL | 1 refills | Status: AC

## 2017-09-30 MED ORDER — SERTRALINE HCL 100 MG PO TABS: 200.0000 mg | ORAL_TABLET | Freq: Every day | ORAL | 8 refills | Status: AC

## 2017-09-30 NOTE — Progress Notes (Signed)
Patient ID: [name], female   DOB: [birth year], 66 y.o.   MRN: [id number] [facility] MD Progress Note  09/30/2017 11:10 AM [name]  MRN:  [id number] Subjective:   Better  Happy Principal Problem: major depression , recurrent , mild Diagnosis:   Major depression, recurrent, mild   Today the patient is stable. Fortunately she is made a good connection with a therapist here. She seems to like her has a return appointment. The patient says her mood is somewhat stable and she is less depressed.She's eating excessively. Her sleep is fairly. The patient started to try to read again and watch some TV. Her husband is no difference drinking excessively. The patient does have some good plans for Christmas. Spent time Art gallery manager. Patient takes her medicines just as prescribed. Her only issues is that she does feel more anxious. She feels a lot of trembling.She appears distracted time she says. Mainly she describes significant anxiety particularly as we had towards the holidays this is an individual who lost her brother from a son heart attack. She's also woman who daughter died years ago. The patient is not suicidal. She drinks no alcohol use she's actually functioning fairly well.   Past Psychiatric History:   Past Medical History:  Past Medical History:  Diagnosis Date  . Anemia   . Arthritis   . Borderline personality disorder (HCC)   . CAD (coronary artery disease)    Catherization 11/18/09>nonobstructive CAD , normal LV function, EF 65%  . Cervicalgia   . Chronic kidney disease (CKD), stage IV (severe) (HCC)   . Dental caries    periodontitis and mandibular tori  . Depression   . Diabetes mellitus   . Diabetes mellitus, type II (HCC)   . Diabetic gastroparesis (HCC)   . Diastolic CHF (HCC)    Grade I, on Echo 06/2013, EF 55-60%  . Dyspnea   . Fibromyalgia   . GERD (gastroesophageal reflux disease)   . Gout   . Headache(784.0)   . Health maintenance examination    Colonoscopy  2009> normal; Diabetic eye exam 12/2008. No diabetic retinopathy; Mammogram 11/10> No evidence of malignancy, DXA 03/14/13 : normal  . History of hiatal hernia   . Hyperlipidemia   . Hypertension   . IBS (irritable bowel syndrome)   . Iliotibial band syndrome   . Low back pain syndrome   . Nonorganic psychosis (HCC)   . Peripheral neuropathy   . Right carpal tunnel syndrome 09/13/2014  . SBO (small bowel obstruction) (HCC) 01/09/2013  . Sleep apnea    does not wear CPAP   . Suicide attempt [facility])    " by overdose of whatever I had"  . Vitamin B12 deficiency   . Vitamin D deficiency   . Wears dentures    upper  . Wears glasses   . Wears glasses     Past Surgical History:  Procedure Laterality Date  . ABDOMINAL HYSTERECTOMY    . APPENDECTOMY    . BOWEL RESECTION N/A 01/10/2013   Procedure: SMALL BOWEL RESECTION;  Surgeon: Lodema Pilot, DO;  [facility];  Service: General;  Laterality: N/A;  . CARPAL TUNNEL RELEASE Right 09/13/2014   Procedure: RIGHT WRIST CARPAL TUNNEL RELEASE;  Surgeon: Eulas Post, MD;  Location: [facility];  Service: Orthopedics;  Laterality: Right;  . CARPAL TUNNEL RELEASE Left 02/28/2015   Procedure: LEFT CARPAL TUNNEL RELEASE;  Surgeon: Teryl Lucy, MD;  Location: [facility];  Service: Orthopedics;  Laterality:  Left;  . CATARACT EXTRACTION W/ INTRAOCULAR LENS  IMPLANT, BILATERAL    . COLONOSCOPY    . HAND SURGERY  1992   rt  . HERNIA REPAIR  03/01/14   Lap incisional hernia repair w/mesh  . INCISIONAL HERNIA REPAIR N/A 03/01/2014   Procedure: LAPAROSCOPIC INCISIONAL HERNIA;  Surgeon: Axel FillerArmando Ramirez, MD;  Location: [facility];  Service: General;  Laterality: N/A;  . INSERTION OF MESH N/A 03/01/2014   Procedure: INSERTION OF MESH;  Surgeon: Axel FillerArmando Ramirez, MD;  Location: [facility];  Service: General;  Laterality: N/A;  . LAPAROTOMY N/A 01/10/2013   Procedure: EXPLORATORY LAPAROTOMY;  Surgeon: Lodema PilotBrian Layton, DO;  [facility]: [facility];   Service: General;  Laterality: N/A;  . LYSIS OF ADHESION N/A 01/10/2013   Procedure: LYSIS OF ADHESION;  Surgeon: Lodema PilotBrian Layton, DO;  [facility]: [facility];  Service: General;  Laterality: N/A;  . MULTIPLE TOOTH EXTRACTIONS    . STERIOD INJECTION Left 09/13/2014   Procedure: STEROID INJECTION LEFT HAND;  Surgeon: Eulas PostJoshua P Landau, MD;  Location: [facility];  Service: Orthopedics;  Laterality: Left;  . TOOTH EXTRACTION Bilateral 05/13/2017   Procedure: DENTAL RESTORATION/EXTRACTIONS;  Surgeon: Ocie DoyneJensen, Scott, DDS;  [facility];  Service: Oral Surgery;  Laterality: Bilateral;   Family History:  Family History  Problem Relation Age of Onset  . Diabetes Mother   . Hypertension Mother   . Hyperlipidemia Mother   . Arthritis Mother   . Depression Mother   . Heart disease Father   . Diabetes Brother   . Heart disease Brother   . Diabetes Brother   . Heart disease Paternal Grandmother    Family Psychiatric  History:  Social History:  Social History   Substance and Sexual Activity  Alcohol Use No  . Alcohol/week: 0.0 oz     Social History   Substance and Sexual Activity  Drug Use No    Social History   Socioeconomic History  . Marital status: Married    Spouse name: larance  . Number of children: 2  . Years of education: None  . Highest education level: High school graduate  Social Needs  . Financial resource strain: Not hard at all  . Food insecurity - worry: Never true  . Food insecurity - inability: Never true  . Transportation needs - medical: No  . Transportation needs - non-medical: No  Occupational History    Comment: retired  Tobacco Use  . Smoking status: Former Smoker    Types: Cigarettes    Last attempt to quit: 10/25/2002    Years since quitting: 14.9  . Smokeless tobacco: Never Used  Substance and Sexual Activity  . Alcohol use: No    Alcohol/week: 0.0 oz  . Drug use: No  . Sexual activity: Not Currently  Other Topics Concern  . None  Social  History Narrative  . None   Additional Social History:                         Sleep: Good  Appetite:  Good  Current Medications: Current Outpatient Medications  Medication Sig Dispense Refill  . ACCU-CHEK AVIVA PLUS test strip TEST UPTO 4 TIMES DAILY 125 each 5  . ACCU-CHEK FASTCLIX LANCETS MISC TEST five times a day 102 each 11  . acetaminophen (TYLENOL) 325 MG tablet Take 650 mg by mouth 3 (three) times daily as needed for mild pain or headache.    . AMITIZA 24 MCG capsule Take 24  mcg by mouth 2 (two) times daily.     Marland Kitchen. aspirin EC 81 MG tablet Take 81 mg by mouth daily.    . Biotin 5000 MCG TABS Take 5,000 mcg by mouth daily.    Marland Kitchen. buPROPion (WELLBUTRIN XL) 150 MG 24 hr tablet Take 3 tablets (450 mg total) by mouth daily. 270 tablet 1  . cloNIDine (CATAPRES - DOSED IN MG/24 HR) 0.3 mg/24hr patch Place 1 patch (0.3 mg total) onto the skin once a week. 12 patch 3  . dexlansoprazole (DEXILANT) 60 MG capsule Take 1 capsule (60 mg total) by mouth daily. For reflux 30 capsule   . furosemide (LASIX) 40 MG tablet Take 1 tablet (40 mg total) by mouth daily. 90 tablet 1  . gabapentin (NEURONTIN) 100 MG capsule Take 1 capsule (100 mg total) by mouth 2 (two) times daily. 60 capsule 2  . gabapentin (NEURONTIN) 300 MG capsule Take 2 capsules at bedtime 180 capsule 3  . Insulin Glargine (LANTUS SOLOSTAR) 100 UNIT/ML Solostar Pen inject subcutaneously 45 units at bedtime 30 mL 6  . Insulin Pen Needle (BD PEN NEEDLE NANO U/F) 32G X 4 MM MISC Use to inject insulin into the skin 100 each 6  . LINZESS 145 MCG CAPS capsule Take 145 mcg by mouth daily as needed (consitpation).   1  . LORazepam (ATIVAN) 0.5 MG tablet 2  qam  1 qhs 90 tablet 3  . metoprolol succinate (TOPROL-XL) 100 MG 24 hr tablet Take 1 tablet (100 mg total) by mouth every morning. Take with or immediately following a meal. 90 tablet 3  . Misc Natural Products (TART CHERRY ADVANCED PO) Take 1 capsule by mouth every evening.    .  promethazine (PHENERGAN) 25 MG tablet Take 25 mg by mouth 2 (two) times daily as needed for nausea or vomiting.     . rosuvastatin (CRESTOR) 10 MG tablet Take 1 tablet (10 mg total) by mouth daily. 30 tablet 11  . sertraline (ZOLOFT) 100 MG tablet Take 2 tablets (200 mg total) by mouth at bedtime. 100 tablet 8  . sitaGLIPtin (JANUVIA) 50 MG tablet Take 1 tablet (50 mg total) by mouth daily. 90 tablet 1  . TURMERIC CURCUMIN PO Take 1 tablet by mouth at bedtime.    . Vitamin D, Ergocalciferol, (DRISDOL) 50000 units CAPS capsule take 1 capsule by mouth every week 12 capsule 0   No current facility-administered medications for this visit.     Lab Results:  Results for orders placed or performed in visit on 09/28/17 (from the past 48 hour(s))  Glucose, capillary     Status: Abnormal   Collection Time: 09/28/17  2:28 PM  Result Value Ref Range   Glucose-Capillary 250 (H) 65 - 99 mg/dL  POC Hbg [id number]     Status: None   Collection Time: 09/28/17  2:37 PM  Result Value Ref Range   Hemoglobin A1C 9.6    *Note: Due to a large number of results and/or encounters for the requested time period, some results have not been displayed. A complete set of results can be found in Results Review.    Physical Findings: AIMS:  , ,  ,  ,    CIWA:    COWS:     Musculoskeletal: Strength & Muscle Tone: within normal limits Gait & Station: normal Patient leans: N/A  Psychiatric Specialty Exam: ROS  Blood pressure 94/60, pulse 69, weight 196 lb 12.8 oz (89.3 kg).Body mass index is 32.75 kg/m.  General  Appearance: Casual  Eye Contact::  Good  Speech:  Clear and Coherent  Volume:  Normal  Mood:  Euthymic  Affect:  Congruent  Thought Process:  Coherent  Orientation:  Full (Time, Place, and Person)  Thought Content:  WDL  Suicidal Thoughts:  No  Homicidal Thoughts:  No  Memory:  NA  Judgement:  Good  Insight:  Good  Psychomotor Activity:  Normal  Concentration:  Good  Recall:  Good  Fund of  Knowledge:Good  Language: Good  Akathisia:  No  Handed:  Right  AIMS (if indicated):     Assets:  Desire for Improvement  ADL's:  Intact  Cognition: WNL  Sleep:      Treatment Plan Summary: 09/30/2017, 11:10 AM   Today the patient is doing well. Her mood is stable. She denies any neurological symptoms or any physical complaints other than chronic back pain the patient takes 200 mg of Zoloft 450 mg Wellbutrin and does well. At this time the patient is not in therapy. Her #1 problem therefore is major clinical depression is well-controlled with Zoloft and Wellbutrin. Her other problem is her alcohol dependent husband who seems to be drinking just a little bit less. Her last problem is chronic pain and she presently is actively involved in care for this condition. This patient to return to see me in 5 months..Patient ID: [name], female   DOB: 24-May-1951, 66 y.o.   MRN: [id number] [facility] MD Progress Note  09/30/2017 11:10 AM MERCADEZ HEITMAN  MRN:  [id number] Subjective:   Better  Happy Principal Problem: major depression , recurrent , mild Diagnosis:   Major depression, recurrent, mild    Past Psychiatric History:   Past Medical History:  Past Medical History:  Diagnosis Date  . Anemia   . Arthritis   . Borderline personality disorder (HCC)   . CAD (coronary artery disease)    Catherization 11/18/09>nonobstructive CAD , normal LV function, EF 65%  . Cervicalgia   . Chronic kidney disease (CKD), stage IV (severe) (HCC)   . Dental caries    periodontitis and mandibular tori  . Depression   . Diabetes mellitus   . Diabetes mellitus, type II (HCC)   . Diabetic gastroparesis (HCC)   . Diastolic CHF (HCC)    Grade I, on Echo 06/2013, EF 55-60%  . Dyspnea   . Fibromyalgia   . GERD (gastroesophageal reflux disease)   . Gout   . Headache(784.0)   . Health maintenance examination    Colonoscopy 2009> normal; Diabetic eye exam 12/2008. No diabetic retinopathy; Mammogram 11/10> No  evidence of malignancy, DXA 03/14/13 : normal  . History of hiatal hernia   . Hyperlipidemia   . Hypertension   . IBS (irritable bowel syndrome)   . Iliotibial band syndrome   . Low back pain syndrome   . Nonorganic psychosis (HCC)   . Peripheral neuropathy   . Right carpal tunnel syndrome 09/13/2014  . SBO (small bowel obstruction) (HCC) 01/09/2013  . Sleep apnea    does not wear CPAP   . Suicide attempt [facility])    " by overdose of whatever I had"  . Vitamin B12 deficiency   . Vitamin D deficiency   . Wears dentures    upper  . Wears glasses   . Wears glasses     Past Surgical History:  Procedure Laterality Date  . ABDOMINAL HYSTERECTOMY    . APPENDECTOMY    . BOWEL RESECTION N/A 01/10/2013   Procedure:  SMALL BOWEL RESECTION;  Surgeon: Lodema Pilot, DO;  [facility];  Service: General;  Laterality: N/A;  . CARPAL TUNNEL RELEASE Right 09/13/2014   Procedure: RIGHT WRIST CARPAL TUNNEL RELEASE;  Surgeon: Eulas Post, MD;  Location: [facility];  Service: Orthopedics;  Laterality: Right;  . CARPAL TUNNEL RELEASE Left 02/28/2015   Procedure: LEFT CARPAL TUNNEL RELEASE;  Surgeon: Teryl Lucy, MD;  Location: [facility];  Service: Orthopedics;  Laterality: Left;  . CATARACT EXTRACTION W/ INTRAOCULAR LENS  IMPLANT, BILATERAL    . COLONOSCOPY    . HAND SURGERY  1992   rt  . HERNIA REPAIR  03/01/14   Lap incisional hernia repair w/mesh  . INCISIONAL HERNIA REPAIR N/A 03/01/2014   Procedure: LAPAROSCOPIC INCISIONAL HERNIA;  Surgeon: Axel Filler, MD;  Location: [facility];  Service: General;  Laterality: N/A;  . INSERTION OF MESH N/A 03/01/2014   Procedure: INSERTION OF MESH;  Surgeon: Axel Filler, MD;  [facility]: [facility];  Service: General;  Laterality: N/A;  . LAPAROTOMY N/A 01/10/2013   Procedure: EXPLORATORY LAPAROTOMY;  Surgeon: Lodema Pilot, DO;  [facility];  Service: General;  Laterality: N/A;  . LYSIS OF ADHESION N/A 01/10/2013   Procedure:  LYSIS OF ADHESION;  Surgeon: Lodema Pilot, DO;  Location: [facility];  Service: General;  Laterality: N/A;  . MULTIPLE TOOTH EXTRACTIONS    . STERIOD INJECTION Left 09/13/2014   Procedure: STEROID INJECTION LEFT HAND;  Surgeon: Eulas Post, MD;  Location: [facility];  Service: Orthopedics;  Laterality: Left;  . TOOTH EXTRACTION Bilateral 05/13/2017   Procedure: DENTAL RESTORATION/EXTRACTIONS;  Surgeon: Ocie Doyne, DDS;  Location: [facility];  Service: Oral Surgery;  Laterality: Bilateral;   Family History:  Family History  Problem Relation Age of Onset  . Diabetes Mother   . Hypertension Mother   . Hyperlipidemia Mother   . Arthritis Mother   . Depression Mother   . Heart disease Father   . Diabetes Brother   . Heart disease Brother   . Diabetes Brother   . Heart disease Paternal Grandmother    Family Psychiatric  History:  Social History:  Social History   Substance and Sexual Activity  Alcohol Use No  . Alcohol/week: 0.0 oz     Social History   Substance and Sexual Activity  Drug Use No    Social History   Socioeconomic History  . Marital status: Married    Spouse name: larance  . Number of children: 2  . Years of education: None  . Highest education level: High school graduate  Social Needs  . Financial resource strain: Not hard at all  . Food insecurity - worry: Never true  . Food insecurity - inability: Never true  . Transportation needs - medical: No  . Transportation needs - non-medical: No  Occupational History    Comment: retired  Tobacco Use  . Smoking status: Former Smoker    Types: Cigarettes    Last attempt to quit: 10/25/2002    Years since quitting: 14.9  . Smokeless tobacco: Never Used  Substance and Sexual Activity  . Alcohol use: No    Alcohol/week: 0.0 oz  . Drug use: No  . Sexual activity: Not Currently  Other Topics Concern  . None  Social History Narrative  . None   Additional Social History:  Sleep: Good  Appetite:  Good  Current Medications: Current Outpatient Medications  Medication Sig Dispense Refill  . ACCU-CHEK AVIVA PLUS test strip TEST UPTO 4 TIMES DAILY 125 each 5  . ACCU-CHEK FASTCLIX LANCETS MISC TEST five times a day 102 each 11  . acetaminophen (TYLENOL) 325 MG tablet Take 650 mg by mouth 3 (three) times daily as needed for mild pain or headache.    . AMITIZA 24 MCG capsule Take 24 mcg by mouth 2 (two) times daily.     Marland Kitchen aspirin EC 81 MG tablet Take 81 mg by mouth daily.    . Biotin 5000 MCG TABS Take 5,000 mcg by mouth daily.    Marland Kitchen buPROPion (WELLBUTRIN XL) 150 MG 24 hr tablet Take 3 tablets (450 mg total) by mouth daily. 270 tablet 1  . cloNIDine (CATAPRES - DOSED IN MG/24 HR) 0.3 mg/24hr patch Place 1 patch (0.3 mg total) onto the skin once a week. 12 patch 3  . dexlansoprazole (DEXILANT) 60 MG capsule Take 1 capsule (60 mg total) by mouth daily. For reflux 30 capsule   . furosemide (LASIX) 40 MG tablet Take 1 tablet (40 mg total) by mouth daily. 90 tablet 1  . gabapentin (NEURONTIN) 100 MG capsule Take 1 capsule (100 mg total) by mouth 2 (two) times daily. 60 capsule 2  . gabapentin (NEURONTIN) 300 MG capsule Take 2 capsules at bedtime 180 capsule 3  . Insulin Glargine (LANTUS SOLOSTAR) 100 UNIT/ML Solostar Pen inject subcutaneously 45 units at bedtime 30 mL 6  . Insulin Pen Needle (BD PEN NEEDLE NANO U/F) 32G X 4 MM MISC Use to inject insulin into the skin 100 each 6  . LINZESS 145 MCG CAPS capsule Take 145 mcg by mouth daily as needed (consitpation).   1  . LORazepam (ATIVAN) 0.5 MG tablet 2  qam  1 qhs 90 tablet 3  . metoprolol succinate (TOPROL-XL) 100 MG 24 hr tablet Take 1 tablet (100 mg total) by mouth every morning. Take with or immediately following a meal. 90 tablet 3  . Misc Natural Products (TART CHERRY ADVANCED PO) Take 1 capsule by mouth every evening.    . promethazine (PHENERGAN) 25 MG tablet Take 25 mg by mouth 2 (two) times daily as  needed for nausea or vomiting.     . rosuvastatin (CRESTOR) 10 MG tablet Take 1 tablet (10 mg total) by mouth daily. 30 tablet 11  . sertraline (ZOLOFT) 100 MG tablet Take 2 tablets (200 mg total) by mouth at bedtime. 100 tablet 8  . sitaGLIPtin (JANUVIA) 50 MG tablet Take 1 tablet (50 mg total) by mouth daily. 90 tablet 1  . TURMERIC CURCUMIN PO Take 1 tablet by mouth at bedtime.    . Vitamin D, Ergocalciferol, (DRISDOL) 50000 units CAPS capsule take 1 capsule by mouth every week 12 capsule 0   No current facility-administered medications for this visit.     Lab Results:  Results for orders placed or performed in visit on 09/28/17 (from the past 48 hour(s))  Glucose, capillary     Status: Abnormal   Collection Time: 09/28/17  2:28 PM  Result Value Ref Range   Glucose-Capillary 250 (H) 65 - 99 mg/dL  POC Hbg [id number]     Status: None   Collection Time: 09/28/17  2:37 PM  Result Value Ref Range   Hemoglobin A1C 9.6    *Note: Due to a large number of results and/or encounters for the requested time period, some results have  not been displayed. A complete set of results can be found in Results Review.    Physical Findings: AIMS:  , ,  ,  ,    CIWA:    COWS:     Musculoskeletal: Strength & Muscle Tone: within normal limits Gait & Station: normal Patient leans: N/A  Psychiatric Specialty Exam: ROS  Blood pressure 94/60, pulse 69, weight 196 lb 12.8 oz (89.3 kg).Body mass index is 32.75 kg/m.  General Appearance: Casual  Eye Contact::  Good  Speech:  Clear and Coherent  Volume:  Normal  Mood:  Euthymic  Affect:  Congruent  Thought Process:  Coherent  Orientation:  Full (Time, Place, and Person)  Thought Content:  WDL  Suicidal Thoughts:  No  Homicidal Thoughts:  No  Memory:  NA  Judgement:  Good  Insight:  Good  Psychomotor Activity:  Normal  Concentration:  Good  Recall:  Good  Fund of Knowledge:Good  Language: Good  Akathisia:  No  Handed:  Right  AIMS (if  indicated):     Assets:  Desire for Improvement  ADL's:  Intact  Cognition: WNL  Sleep:      Treatment Plan Summary: 09/30/2017, 11:10 AM   Overall the patient is doing fairly well. She does have excessive anxiety. At this time I'll continue her Zoloft 200 mg Wellbutrin 450. She claims they are helpful.today we'll go ahead and increase her Ativan to taking 0.5 mg taking 2 in the morning and one at night. She'll continue in therapy and return to see me in 2 months for more thorough evaluation. This is a medication check. The patient continues to therapy stable continues to engage in her life with her children and her husband. Husband has a chronic problem for his excessive alcohol use.

## 2017-10-03 NOTE — Progress Notes (Signed)
I saw and evaluated the patient.  I personally confirmed the key portions of Dr. Lacroce's history and exam and reviewed pertinent patient test results.  The assessment, diagnosis, and plan were formulated together and I agree with the documentation in the resident's note. 

## 2017-10-10 DIAGNOSIS — G4731 Primary central sleep apnea: Secondary | ICD-10-CM | POA: Diagnosis not present

## 2017-10-11 ENCOUNTER — Ambulatory Visit (HOSPITAL_COMMUNITY): Payer: Self-pay | Admitting: Licensed Clinical Social Worker

## 2017-10-13 ENCOUNTER — Encounter: Payer: Self-pay | Admitting: Internal Medicine

## 2017-10-13 ENCOUNTER — Ambulatory Visit (INDEPENDENT_AMBULATORY_CARE_PROVIDER_SITE_OTHER): Payer: Medicare Other | Admitting: Internal Medicine

## 2017-10-13 VITALS — BP 141/54 | HR 73 | Temp 98.0°F | Wt 198.8 lb

## 2017-10-13 DIAGNOSIS — W19XXXA Unspecified fall, initial encounter: Secondary | ICD-10-CM

## 2017-10-13 DIAGNOSIS — Y92009 Unspecified place in unspecified non-institutional (private) residence as the place of occurrence of the external cause: Principal | ICD-10-CM

## 2017-10-13 DIAGNOSIS — R55 Syncope and collapse: Secondary | ICD-10-CM | POA: Diagnosis not present

## 2017-10-13 DIAGNOSIS — R51 Headache: Secondary | ICD-10-CM

## 2017-10-13 DIAGNOSIS — R42 Dizziness and giddiness: Secondary | ICD-10-CM | POA: Diagnosis not present

## 2017-10-13 DIAGNOSIS — R296 Repeated falls: Secondary | ICD-10-CM | POA: Insufficient documentation

## 2017-10-13 NOTE — Progress Notes (Signed)
   CC: Fall  HPI:  [name] is a 66 y.o. F with PMHx listed below presenting for Fall. Please see the A&P for the status of the patient's chronic medical problems.  Patient states that she fell on 2 days ago. She states that she had just gotten out of bed and was near her closet blowing her nose when she became lightheaded and fell back wards. She states she did hit her head. She denies bleeding, loss of consciousness, confusion, vision changes, hearing changes. She endorses headaches, but states that they were ongoing prior to her fall and are unchanged. She states that her head is tender to touch where she hit it, but it does not hurt her when she is not touching it. She states that she is currently in physical therapy for dysequilibrium to help with some balance problems she has had in the past.  Past Medical History:  Diagnosis Date  . Anemia   . Arthritis   . Borderline personality disorder (HCC)   . CAD (coronary artery disease)    Catherization 11/18/09>nonobstructive CAD , normal LV function, EF 65%  . Cervicalgia   . Chronic kidney disease (CKD), stage IV (severe) (HCC)   . Dental caries    periodontitis and mandibular tori  . Depression   . Diabetes mellitus   . Diabetes mellitus, type II (HCC)   . Diabetic gastroparesis (HCC)   . Diastolic CHF (HCC)    Grade I, on Echo 06/2013, EF 55-60%  . Dyspnea   . Fibromyalgia   . GERD (gastroesophageal reflux disease)   . Gout   . Headache(784.0)   . Health maintenance examination    Colonoscopy 2009> normal; Diabetic eye exam 12/2008. No diabetic retinopathy; Mammogram 11/10> No evidence of malignancy, DXA 03/14/13 : normal  . History of hiatal hernia   . Hyperlipidemia   . Hypertension   . IBS (irritable bowel syndrome)   . Iliotibial band syndrome   . Low back pain syndrome   . Nonorganic psychosis (HCC)   . Peripheral neuropathy   . Right carpal tunnel syndrome 09/13/2014  . SBO (small bowel obstruction) (HCC)  01/09/2013  . Sleep apnea    does not wear CPAP   . [facility]    " by overdose of whatever I had"  . Vitamin B12 deficiency   . Vitamin D deficiency   . Wears dentures    upper  . Wears glasses   . Wears glasses    Review of Systems: Performed and all others negative.  Physical Exam:  Vitals:   10/13/17 1310  BP: (!) 141/54  Pulse: 73  Temp: 98 F (36.7 C)  TempSrc: Oral  SpO2: 100%  Weight: 198 lb 12.8 oz (90.2 kg)   Physical Exam  Constitutional: She is oriented to person, place, and time. She appears well-developed and well-nourished.  Cardiovascular: Normal rate, regular rhythm and normal heart sounds.  Pulmonary/Chest: Effort normal and breath sounds normal. No respiratory distress.  Abdominal: Soft. Bowel sounds are normal. She exhibits no distension. There is no tenderness.  Musculoskeletal:  Tender to palpation over right occipital region No bruising, cuts, or edema noted  Neurological: She is alert and oriented to person, place, and time.    Assessment & Plan:   See Encounters Tab for problem based charting.  Patient seen with Dr. Cleda DaubE. Hoffman

## 2017-10-13 NOTE — Patient Instructions (Addendum)
Thank you for allowing us to care for you.  For your recent fall: - Believed to have been caused by a vasovagal event when blowing your nose causing your blood pressure to drop - Continue with your remaining rehab - Call the clinic or other medical professional if you experience worsening symptoms such as severe headache or vision changes - Return to clinic if you experience repeat falls   Vasovagal Syncope, Adult Syncope, which is commonly known as fainting or passing out, is a temporary loss of consciousness. It occurs when the blood flow to the brain is reduced. Vasovagal syncope, also called neurocardiogenic syncope, is a fainting spell that happens when blood flow to the brain is reduced because of a sudden drop in heart rate and blood pressure. Vasovagal syncope is usually harmless. However, you can get injured if you fall during a fainting spell. What are the causes? This condition is caused by a drop in heart rate and blood pressure, usually in response to a trigger. Many things and situations can trigger an episode, including:  Pain.  Fear.  The sight of blood. This may occur during medical procedures, such as when blood is being drawn from a vein.  Common activities, such as coughing, swallowing, stretching, or going to the bathroom.  Emotional stress.  Being in a confined space.  Prolonged standing, especially in a warm environment.  Lack of sleep or rest.  Not eating for a long time.  Not drinking enough liquids.  Recent illness.  Drinking alcohol.  Taking drugs that affect blood pressure, such as marijuana, cocaine, opiates, or inhalants.  What are the signs or symptoms? Before a fainting episode, you may:  Feel dizzy or light-headed.  Become pale.  Sense that you are going to faint.  Feel like the room is spinning.  Only see directly ahead (tunnel vision).  Feel sick to your stomach (nauseous).  See spots.  Slowly lose vision.  Hear ringing in  your ears.  Have a headache.  Feel warm and sweaty.  Feel a sensation of pins and needles.  During the fainting spell, you may twitch or make jerky movements. Fainting spells usually last no longer than a few minutes before you wake up. If you get up too quickly before your body can recover, you may faint again. How is this diagnosed? This condition is diagnosed based on your symptoms, your medical history, and a physical exam. Tests may be done to rule out other causes of fainting. Tests may include:  Blood tests.  Heart tests, such as an electrocardiogram (ECG), echocardiogram, or electrophysiology study.  A test to check your response to changes in position (tilt table test).  How is this treated? Usually, treatment is not needed for this condition. Your health care provider may suggest ways to help prevent fainting episodes. These may include:  Drinking additional fluids if you are exposed to a trigger.  Sitting or lying down if you notice signs that an episode is coming.  If your fainting spells continue, your health care provider may recommend that you:  Take medicines to prevent fainting or to help reduce further episodes of fainting.  Do certain exercises.  Wear compression stockings.  Have surgery to place a pacemaker in your body (rare).  Follow these instructions at home:  Learn to identify the signs that an episode is coming.  Sit or lie down at the first sign of a fainting spell. If you sit down, put your head down between your legs. If  you lie down, swing your legs up in the air to increase blood flow to the brain.  Avoid hot tubs and saunas.  Avoid standing for a long time. If you have to stand for a long time, try: ? Crossing your legs. ? Flexing and stretching your leg muscles. ? Squatting. ? Moving your legs. ? Bending over.  Drink enough fluid to keep your urine clear or pale yellow.  Make changes to your diet that your health care provider  recommends. You may be told to: ? Avoid caffeine. ? Eat more salt.  Take over-the-counter and prescription medicines only as told by your health care provider. Contact a health care provider if:  You continue to have fainting spells despite treatment.  You faint more often despite treatment.  You lose consciousness for more than a few minutes.  You faint during or after exercising or after being startled.  You have twitching or jerky movements for longer than a few seconds during a fainting spell.  You have an episode of twitching or jerky movements without fainting. Get help right away if:  A fainting spell leads to an injury or bleeding.  You have new symptoms that occur with the fainting spells, such as: ? Shortness of breath. ? Chest pain. ? Irregular heartbeat.  You twitch or make jerky movements for more than 5 minutes.  You twitch or make jerky movements during more than one fainting spell. This information is not intended to replace advice given to you by your health care provider. Make sure you discuss any questions you have with your health care provider. Document Released: 09/27/2012 Document Revised: 03/24/2016 Document Reviewed: 08/09/2015 Elsevier Interactive Patient Education  [employer]2018 [employer]

## 2017-10-13 NOTE — Assessment & Plan Note (Addendum)
Patient states that she fell on 2 days ago. She states that she had just gotten out of bed and was near her closet blowing her nose when she became lightheaded and fell back wards. She states she did hit her head. She denies bleeding, loss of consciousness, confusion, vision changes, hearing changes. She endorses headaches, but states that they were ongoing prior to her fall and are unchanged. She states that her head is tender to touch where she hit it, but it does not hurt her when she is not touching it. She states that she is currently in physical therapy for dysequilibrium to help with some balance problems she has had in the past.  Patient without neurologic symptoms and negative orthostatic vitals today. Fall secondary to vasovagal event while blowing her nose. Patient report that she has a button that she wears at home to call for help if she is alone. She also has a walker at home if she needs it. - Return precautions given - Patient reports already being in a physical therapy program

## 2017-10-14 NOTE — Progress Notes (Signed)
Internal Medicine Clinic Attending  I saw and evaluated the patient.  I personally confirmed the key portions of the history and exam documented by Dr. Melvin and I reviewed pertinent patient test results.  The assessment, diagnosis, and plan were formulated together and I agree with the documentation in the resident's note.  

## 2017-10-24 ENCOUNTER — Other Ambulatory Visit: Payer: Self-pay | Admitting: Physician Assistant

## 2017-10-24 DIAGNOSIS — K2960 Other gastritis without bleeding: Secondary | ICD-10-CM | POA: Diagnosis not present

## 2017-10-24 DIAGNOSIS — K5900 Constipation, unspecified: Secondary | ICD-10-CM | POA: Diagnosis not present

## 2017-10-24 DIAGNOSIS — R1310 Dysphagia, unspecified: Secondary | ICD-10-CM

## 2017-10-24 DIAGNOSIS — R110 Nausea: Secondary | ICD-10-CM | POA: Diagnosis not present

## 2017-10-26 ENCOUNTER — Ambulatory Visit (HOSPITAL_COMMUNITY): Payer: Self-pay | Admitting: Licensed Clinical Social Worker

## 2017-10-31 NOTE — Progress Notes (Signed)
[name] [address] [place], [place] [address] Phone: [phone] Subjective:     CC: Back and headache pain follow-up  BJY:[id number]  [name] is a 67 y.o. female coming in with complaint of neck and back pain. She states she has been getting headaches at the back of her head ([place]). She also has mid to lower back pain.  Onset- 1 year Location- [place], mid to lower back Duration- Pain all the time Character- Unbearable pain Aggravating factors- Neck rotation Reliving factors- Ice Therapies tried-  Severity-5 out of 10.  Low back patient does have known nerve impingement.  Has responded very well to epidurals.  States that she is having more pain but does not feel it is bad enough for another injection.  Nothing radiating down the legs at this time.     Past Medical History:  Diagnosis Date  . Anemia   . Arthritis   . Borderline personality disorder (HCC)   . CAD (coronary artery disease)    Catherization 11/18/09>nonobstructive CAD , normal LV function, EF 65%  . Cervicalgia   . Chronic kidney disease (CKD), stage IV (severe) (HCC)   . Dental caries    periodontitis and mandibular tori  . Depression   . Diabetes mellitus   . Diabetes mellitus, type II (HCC)   . Diabetic gastroparesis (HCC)   . Diastolic CHF (HCC)    Grade I, on Echo 06/2013, EF 55-60%  . Dyspnea   . Fibromyalgia   . GERD (gastroesophageal reflux disease)   . Gout   . Headache(784.0)   . Health maintenance examination    Colonoscopy 2009> normal; Diabetic eye exam 12/2008. No diabetic retinopathy; Mammogram 11/10> No evidence of malignancy, DXA 03/14/13 : normal  . History of hiatal hernia   . Hyperlipidemia   . Hypertension   . IBS (irritable bowel syndrome)   . Iliotibial band syndrome   . Low back pain syndrome   . Nonorganic psychosis (HCC)   . Peripheral neuropathy   . Right carpal tunnel syndrome 09/13/2014  . SBO (small bowel obstruction) (HCC)  01/09/2013  . Sleep apnea    does not wear CPAP   . Suicide attempt [facility])    " by overdose of whatever I had"  . Vitamin B12 deficiency   . Vitamin D deficiency   . Wears dentures    upper  . Wears glasses   . Wears glasses    Past Surgical History:  Procedure Laterality Date  . ABDOMINAL HYSTERECTOMY    . APPENDECTOMY    . BOWEL RESECTION N/A 01/10/2013   Procedure: SMALL BOWEL RESECTION;  Surgeon: Lodema Pilot, DO;  Location: [facility] OR;  Service: General;  Laterality: N/A;  . CARPAL TUNNEL RELEASE Right 09/13/2014   Procedure: RIGHT WRIST CARPAL TUNNEL RELEASE;  Surgeon: Eulas Post, MD;  Location: [facility];  Service: Orthopedics;  Laterality: Right;  . CARPAL TUNNEL RELEASE Left 02/28/2015   Procedure: LEFT CARPAL TUNNEL RELEASE;  Surgeon: Teryl Lucy, MD;  Location: [facility];  Service: Orthopedics;  Laterality: Left;  . CATARACT EXTRACTION W/ INTRAOCULAR LENS  IMPLANT, BILATERAL    . COLONOSCOPY    . HAND SURGERY  1992   rt  . HERNIA REPAIR  03/01/14   Lap incisional hernia repair w/mesh  . INCISIONAL HERNIA REPAIR N/A 03/01/2014   Procedure: LAPAROSCOPIC INCISIONAL HERNIA;  Surgeon: Axel Filler, MD;  Location: [facility];  Service: General;  Laterality: N/A;  .  INSERTION OF MESH N/A 03/01/2014   Procedure: INSERTION OF MESH;  Surgeon: Axel Filler, MD;  Location: [facility];  Service: General;  Laterality: N/A;  . LAPAROTOMY N/A 01/10/2013   Procedure: EXPLORATORY LAPAROTOMY;  Surgeon: Lodema Pilot, DO;  Location: [facility];  Service: General;  Laterality: N/A;  . LYSIS OF ADHESION N/A 01/10/2013   Procedure: LYSIS OF ADHESION;  Surgeon: Lodema Pilot, DO;  Location: [facility];  Service: General;  Laterality: N/A;  . MULTIPLE TOOTH EXTRACTIONS    . STERIOD INJECTION Left 09/13/2014   Procedure: STEROID INJECTION LEFT HAND;  Surgeon: Eulas Post, MD;  Location: [facility];  Service: Orthopedics;  Laterality: Left;  . TOOTH EXTRACTION  Bilateral 05/13/2017   Procedure: DENTAL RESTORATION/EXTRACTIONS;  Surgeon: Ocie Doyne, DDS;  Location: [facility] OR;  Service: Oral Surgery;  Laterality: Bilateral;   Social History   Socioeconomic History  . Marital status: Married    Spouse name: larance  . Number of children: 2  . Years of education: None  . Highest education level: High school graduate  Social Needs  . Financial resource strain: Not hard at all  . Food insecurity - worry: Never true  . Food insecurity - inability: Never true  . Transportation needs - medical: No  . Transportation needs - non-medical: No  Occupational History    Comment: retired  Tobacco Use  . Smoking status: Former Smoker    Types: Cigarettes    Last attempt to quit: 10/25/2002    Years since quitting: 15.0  . Smokeless tobacco: Never Used  Substance and Sexual Activity  . Alcohol use: No    Alcohol/week: 0.0 oz  . Drug use: No  . Sexual activity: Not Currently  Other Topics Concern  . None  Social History Narrative  . None   Allergies  Allergen Reactions  . Penicillins Hives, Itching and Other (See Comments)    Gets delusional and gets hives  Has patient had a PCN reaction causing immediate rash, facial/tongue/throat swelling, SOB or lightheadedness with hypotension: Yes Has patient had a PCN reaction causing severe rash involving mucus membranes or skin necrosis: No Has patient had a PCN reaction that required hospitalization No Has patient had a PCN reaction occurring within the last 10 years: No If all of the above answers are "NO", then may proceed with Cephalosporins  . Cimetidine Other (See Comments)    Tagamet-Breast swelling  . Indomethacin Diarrhea    Caused severe diarrhea with episodes of incontinance  . Adhesive [Tape] Other (See Comments)    Causes bruises Okay to use paper tape only  . Sulfamethoxazole Other (See Comments)    Unknown reaction    Family History  Problem Relation Age of Onset  . Diabetes Mother     . Hypertension Mother   . Hyperlipidemia Mother   . Arthritis Mother   . Depression Mother   . Heart disease Father   . Diabetes Brother   . Heart disease Brother   . Diabetes Brother   . Heart disease Paternal Grandmother      Past medical history, social, surgical and family history all reviewed in electronic medical record.  No pertanent information unless stated regarding to the chief complaint.   Review of Systems:Review of systems updated and as accurate as of 11/01/17  No , visual changes, nausea, vomiting, diarrhea, constipation, dizziness, abdominal pain, skin rash, fevers, chills, night sweats, weight loss, swollen lymph nodes,  chest pain, shortness of breath, mood changes.  Positive  muscle aches, body aches and headaches  Objective  Blood pressure (!) 148/80, pulse 66, height 5\' 5"  (1.651 m), weight 195 lb (88.5 kg), SpO2 97 %. Systems examined below as of 11/01/17   General: No apparent distress alert and oriented x3 mood and affect normal, dressed appropriately.  HEENT: Pupils equal, extraocular movements intact  Respiratory: Patient's speak in full sentences and does not appear short of breath  Cardiovascular: No lower extremity edema, non tender, no erythema  Skin: Warm dry intact with no signs of infection or rash on extremities or on axial skeleton.  Abdomen: Soft nontender  Neuro: Cranial nerves II through XII are intact, neurovascularly intact in all extremities with 2+ DTRs and 2+ pulses.  Lymph: No lymphadenopathy of posterior or anterior cervical chain or axillae bilaterally.  Gait normal with good balance and coordination.  MSK:  Non tender with full range of motion and good stability and symmetric strength and tone of shoulders, elbows, wrist, hip, knee and ankles bilaterally.  Arthritic changes of multiple joints  Neck: Inspection mild increase in kyphosis of the upper back.. No palpable stepoffs. Negative Spurling's maneuver. Mild limitation in range  of motion with side bending and rotation of 5-10 degrees. Grip strength and sensation normal in bilateral hands Strength good C4 to T1 distribution No sensory change to C4 to T1 Negative Hoffman sign bilaterally Reflexes normal .  Tightness of the trapezius muscles bilaterally  Lower back shows loss of lordosis.  Significant tightness in all planes.  Patient has a negative straight leg test.  Strength is 4+ out of 5 but is symmetric.  Mild positive Faber bilaterally.  Negative straight leg test.    Impression and Recommendations:     This case required medical decision making of moderate complexity.      Note: This dictation was prepared with Dragon dictation along with smaller phrase technology. Any transcriptional errors that result from this process are unintentional.

## 2017-11-01 ENCOUNTER — Ambulatory Visit (INDEPENDENT_AMBULATORY_CARE_PROVIDER_SITE_OTHER): Payer: Medicare Other | Admitting: Dietician

## 2017-11-01 ENCOUNTER — Encounter (HOSPITAL_COMMUNITY): Payer: Self-pay | Admitting: Emergency Medicine

## 2017-11-01 ENCOUNTER — Encounter: Payer: Self-pay | Admitting: Family Medicine

## 2017-11-01 ENCOUNTER — Emergency Department (HOSPITAL_COMMUNITY): Payer: Medicare Other

## 2017-11-01 ENCOUNTER — Ambulatory Visit (INDEPENDENT_AMBULATORY_CARE_PROVIDER_SITE_OTHER): Payer: Medicare Other | Admitting: Family Medicine

## 2017-11-01 ENCOUNTER — Ambulatory Visit: Payer: Self-pay | Admitting: Family Medicine

## 2017-11-01 ENCOUNTER — Encounter: Payer: Self-pay | Admitting: Dietician

## 2017-11-01 ENCOUNTER — Ambulatory Visit (INDEPENDENT_AMBULATORY_CARE_PROVIDER_SITE_OTHER)
Admission: RE | Admit: 2017-11-01 | Discharge: 2017-11-01 | Disposition: A | Discharge status: Home / Self Care | Payer: Medicare Other | Source: Ambulatory Visit | Attending: Family Medicine | Admitting: Family Medicine

## 2017-11-01 ENCOUNTER — Emergency Department (HOSPITAL_COMMUNITY)
Admission: EM | Admit: 2017-11-01 | Discharge: 2017-11-01 | Disposition: A | Discharge status: Home / Self Care | Payer: Medicare Other | Attending: Emergency Medicine | Admitting: Emergency Medicine

## 2017-11-01 VITALS — BP 148/80 | HR 66 | Ht 65.0 in | Wt 195.0 lb

## 2017-11-01 DIAGNOSIS — Y939 Activity, unspecified: Secondary | ICD-10-CM | POA: Diagnosis not present

## 2017-11-01 DIAGNOSIS — M25569 Pain in unspecified knee: Secondary | ICD-10-CM | POA: Diagnosis not present

## 2017-11-01 DIAGNOSIS — Z794 Long term (current) use of insulin: Secondary | ICD-10-CM

## 2017-11-01 DIAGNOSIS — Z713 Dietary counseling and surveillance: Secondary | ICD-10-CM

## 2017-11-01 DIAGNOSIS — I2510 Atherosclerotic heart disease of native coronary artery without angina pectoris: Secondary | ICD-10-CM | POA: Diagnosis not present

## 2017-11-01 DIAGNOSIS — N184 Chronic kidney disease, stage 4 (severe): Secondary | ICD-10-CM | POA: Insufficient documentation

## 2017-11-01 DIAGNOSIS — T148XXA Other injury of unspecified body region, initial encounter: Secondary | ICD-10-CM | POA: Diagnosis not present

## 2017-11-01 DIAGNOSIS — Y999 Unspecified external cause status: Secondary | ICD-10-CM | POA: Insufficient documentation

## 2017-11-01 DIAGNOSIS — I5032 Chronic diastolic (congestive) heart failure: Secondary | ICD-10-CM | POA: Diagnosis not present

## 2017-11-01 DIAGNOSIS — W010XXA Fall on same level from slipping, tripping and stumbling without subsequent striking against object, initial encounter: Secondary | ICD-10-CM | POA: Diagnosis not present

## 2017-11-01 DIAGNOSIS — G8929 Other chronic pain: Secondary | ICD-10-CM | POA: Diagnosis not present

## 2017-11-01 DIAGNOSIS — Y92009 Unspecified place in unspecified non-institutional (private) residence as the place of occurrence of the external cause: Secondary | ICD-10-CM | POA: Diagnosis not present

## 2017-11-01 DIAGNOSIS — R300 Dysuria: Secondary | ICD-10-CM | POA: Diagnosis not present

## 2017-11-01 DIAGNOSIS — Z7982 Long term (current) use of aspirin: Secondary | ICD-10-CM | POA: Diagnosis not present

## 2017-11-01 DIAGNOSIS — S8991XA Unspecified injury of right lower leg, initial encounter: Secondary | ICD-10-CM | POA: Diagnosis not present

## 2017-11-01 DIAGNOSIS — M4696 Unspecified inflammatory spondylopathy, lumbar region: Secondary | ICD-10-CM | POA: Diagnosis not present

## 2017-11-01 DIAGNOSIS — M25561 Pain in right knee: Secondary | ICD-10-CM | POA: Diagnosis not present

## 2017-11-01 DIAGNOSIS — M542 Cervicalgia: Secondary | ICD-10-CM

## 2017-11-01 DIAGNOSIS — S8990XA Unspecified injury of unspecified lower leg, initial encounter: Secondary | ICD-10-CM | POA: Insufficient documentation

## 2017-11-01 DIAGNOSIS — S8992XA Unspecified injury of left lower leg, initial encounter: Secondary | ICD-10-CM | POA: Diagnosis not present

## 2017-11-01 DIAGNOSIS — W19XXXA Unspecified fall, initial encounter: Secondary | ICD-10-CM

## 2017-11-01 DIAGNOSIS — M47816 Spondylosis without myelopathy or radiculopathy, lumbar region: Secondary | ICD-10-CM

## 2017-11-01 DIAGNOSIS — I130 Hypertensive heart and chronic kidney disease with heart failure and stage 1 through stage 4 chronic kidney disease, or unspecified chronic kidney disease: Secondary | ICD-10-CM | POA: Insufficient documentation

## 2017-11-01 DIAGNOSIS — E1140 Type 2 diabetes mellitus with diabetic neuropathy, unspecified: Secondary | ICD-10-CM | POA: Diagnosis not present

## 2017-11-01 DIAGNOSIS — Z6832 Body mass index (BMI) 32.0-32.9, adult: Secondary | ICD-10-CM | POA: Diagnosis not present

## 2017-11-01 DIAGNOSIS — M47812 Spondylosis without myelopathy or radiculopathy, cervical region: Secondary | ICD-10-CM | POA: Diagnosis not present

## 2017-11-01 DIAGNOSIS — Z87891 Personal history of nicotine dependence: Secondary | ICD-10-CM | POA: Insufficient documentation

## 2017-11-01 DIAGNOSIS — F329 Major depressive disorder, single episode, unspecified: Secondary | ICD-10-CM

## 2017-11-01 DIAGNOSIS — E1165 Type 2 diabetes mellitus with hyperglycemia: Secondary | ICD-10-CM

## 2017-11-01 DIAGNOSIS — R51 Headache: Secondary | ICD-10-CM | POA: Diagnosis not present

## 2017-11-01 LAB — URINALYSIS, ROUTINE W REFLEX MICROSCOPIC
BILIRUBIN URINE: NEGATIVE
HGB URINE DIPSTICK: NEGATIVE
Ketones, ur: NEGATIVE mg/dL
LEUKOCYTES UA: NEGATIVE
NITRITE: NEGATIVE
PROTEIN: NEGATIVE mg/dL
Specific Gravity, Urine: 1.013 (ref 1.005–1.030)
pH: 5 (ref 5.0–8.0)

## 2017-11-01 LAB — GLUCOSE, CAPILLARY
GLUCOSE-CAPILLARY: 513 mg/dL — AB (ref 65–99)
Glucose-Capillary: 434 mg/dL — ABNORMAL HIGH (ref 65–99)

## 2017-11-01 LAB — CBG MONITORING, ED: GLUCOSE-CAPILLARY: 93 mg/dL (ref 65–99)

## 2017-11-01 MED ORDER — INSULIN ASPART 100 UNIT/ML ~~LOC~~ SOLN
10.0000 [IU] | Freq: Once | SUBCUTANEOUS | Status: AC
  Administered 2017-11-01: 10 [IU] via SUBCUTANEOUS

## 2017-11-01 MED ORDER — INSULIN ASPART 100 UNIT/ML ~~LOC~~ SOLN: 0.0000 [IU] | Freq: Three times a day (TID) | SUBCUTANEOUS | 3 refills | Status: DC

## 2017-11-01 NOTE — Assessment & Plan Note (Signed)
Stable at this time.  Has responded in the past.  Patient wants to not do any other injections at this time.  Patient will continue on the medications and given refill today.  We discussed how the gabapentin can be beneficial.  We discussed icing regimen.  Follow-up again in 4 weeks

## 2017-11-01 NOTE — ED Notes (Signed)
MD informed of Pt slip in bathroom and hitting elbow, denies pain.

## 2017-11-01 NOTE — Progress Notes (Addendum)
  Medical Nutrition Therapy:  Appt start time: 0900 end time:  1000. Visit # 1 in 2019, last visit was a series of 3 in 2016 Last visit  Assessment:  Primary concerns today: "I wish this depression would go away". Fulton Molelice says she thought about cancelling our appointment today, but is weak, thirsty, stumbling and dropping things pretty often and that motivated her to come in and ask for help. She requests a prescription for  insulin pens for correction insulin to help her fix her high blood sugars.  Diabetes diagnosis 1999. Has been on prandial and correction insulin in the past when depression was uncontrolled. Currently not on any prandial insulin. She reports having problems with depression since 6-9 months ago. She is attending both counseling and psychiatry appointments for treatment. She is not checking her blood sugar but is taking her medicines as prescribed. She knows she should be more acuities and eating better, but does not have the energy to do so right now.  Talking of going back to silver sneakers in the future when she feels better and decreasing her weight some more.  Preferred Learning Style: No preference indicated  Learning Readiness: Ready & Change in progress  ANTHROPOMETRICS: weight- declined today, ~ 195 with BMI ~ 32 WEIGHT HISTORY: 181-211# SLEEP:broken and of poor quality, trying to use her CPAP more. Uses it about 3 hours a day right now.  Food Intolerances, constipation & hair loss: need to assess at future visit  MEDICATIONS: she reports taking all diabetes medicine as ordered BLOOD SUGAR: Lab Results  Component Value Date   HGBA1C 9.6 09/28/2017   513 after small mandarin orange earlier today,  DIETARY INTAKE: Usual eating pattern includes 1-3 meals and 1-3 snacks per day. Need to assess dining out frequency.    24-hr recall not done in detail, she says she eats fruit daily, vegetables, tuna salad, cakes, candy, knows she should eat more vegetables and lean  protein   Progress Towards Goal(s):  In progress.   Nutritional Diagnosis:  [place]-2.3 Food-medication interaction As related to insufficient insulin intake to cover and control for her carb intake.  As evidenced by her gradually increasing blood sugars over past few months.    Intervention:  Nutrition counseling about dealing with her diabetes while also receiving help for her depression. reviewed prevention, signs and symptoms of hypoglycemia and provided patient with snack to take with her.  Coordination of care: discussed her care with Dr. Criselda PeachesMullen who okayed her giving 10 unit Novolog to herself today to correct her very high blood sugar. Ms. [name] requests Rx for Novolog pens for correction insulin to keep on hand. Suggest 1 unit to correct each 30-35 mg/dl > 657200 mg/dl correction  Teaching Method Utilized: Visual,Auditory,Hands on, Handouts given during visit include: AVS Barriers to learning/adherence to lifestyle change: depression, competing values Demonstrated degree of understanding via:  Teach Back   Monitoring/Evaluation:  Dietary intake, exercise, meter, and body weight in 1 week(s). Kalven Ganim, Lupita LeashDonna, RD 11/01/2017 12:19 PM.

## 2017-11-01 NOTE — ED Provider Notes (Signed)
[facility]-EMERGENCY DEPT Provider Note   CSN: [id number] Arrival date & time: 11/01/17  1337     History   Chief Complaint Chief Complaint  Patient presents with  . Knee Pain  . Fall    HPI [name] is a 67 y.o. female.  HPI   66 y/o F presenting to the ED today s/p fall that occurred PTA. She states that she fell forward onto her knees when walking into her house. States that she did not catch herself with her hands and did not hit her head. She denies any LOC or prodrome before the fall. She is currently denying any bilat knee pain. She does report mild buttock pain now that she has been sitting in the ED for an extended period of time. This is a chronic issue for her when sitting for too long.  Reports mild dysuria, but denies any other sxs including any fevers, URI sxs, CP, SOB, NVD, constipation, urinary/bowel incontinence, urinary retention, numbness/weakness to BLE, or saddle anesthesia. Has been ambulatory at home. She normally uses a walker at home, but has not been using it and was not using it today when she fell.   Denies a h/o CA or IVDU. Reports a posterior HA that is chronic and unchanged. She is currently being followed for this by her primary physician.   Past Medical History:  Diagnosis Date  . Anemia   . Arthritis   . Borderline personality disorder (HCC)   . CAD (coronary artery disease)    Catherization 11/18/09>nonobstructive CAD , normal LV function, EF 65%  . Cervicalgia   . Chronic kidney disease (CKD), stage IV (severe) (HCC)   . Dental caries    periodontitis and mandibular tori  . Depression   . Diabetes mellitus   . Diabetes mellitus, type II (HCC)   . Diabetic gastroparesis (HCC)   . Diastolic CHF (HCC)    Grade I, on Echo 06/2013, EF 55-60%  . Dyspnea   . Fibromyalgia   . GERD (gastroesophageal reflux disease)   . Gout   . Headache(784.0)   . Health maintenance examination    Colonoscopy 2009> normal; Diabetic  eye exam 12/2008. No diabetic retinopathy; Mammogram 11/10> No evidence of malignancy, DXA 03/14/13 : normal  . History of hiatal hernia   . Hyperlipidemia   . Hypertension   . IBS (irritable bowel syndrome)   . Iliotibial band syndrome   . Low back pain syndrome   . Nonorganic psychosis (HCC)   . Peripheral neuropathy   . Right carpal tunnel syndrome 09/13/2014  . SBO (small bowel obstruction) (HCC) 01/09/2013  . Sleep apnea    does not wear CPAP   . Suicide attempt [facility])    " by overdose of whatever I had"  . Vitamin B12 deficiency   . Vitamin D deficiency   . Wears dentures    upper  . Wears glasses   . Wears glasses     Patient Active Problem List   Diagnosis Date Noted  . Fall at home, initial encounter 10/13/2017  . Nocturia more than twice per night 07/05/2017  . CKD stage 4 due to type 2 diabetes mellitus (HCC) 07/05/2017  . Facet arthritis of lumbar region (HCC) 06/14/2017  . New onset of headaches after age 67 04/11/2017  . Vaginal dryness 01/12/2017  . Arthritis of sacroiliac joint (HCC) 12/06/2016  . Posterior pain of hip, left 11/25/2016  . Ischial pain 06/21/2016  . Ischial bursitis 06/09/2016  .  Vaginal discharge 05/05/2016  . Hepatic steatosis 05/03/2016  . History of Helicobacter pylori infection 05/03/2016  . Diarrhea 02/10/2016  . Hyperkalemia 01/27/2016  . Diastolic CHF (HCC) 01/26/2016  . At high risk for falls 12/25/2015  . Secondary hyperparathyroidism of renal origin (HCC) 08/08/2015  . CKD (chronic kidney disease) stage 3, GFR 30-59 ml/min (HCC) 08/05/2015  . Preventative health care 06/18/2015  . Degenerative lumbar disc 12/30/2014  . Degenerative joint disease involving multiple joints on both sides of body (right foot pain) 08/14/2014  . Chronic low back pain 04/15/2014  . Fibromyalgia syndrome 06/18/2013  . Fatigue and Dizziness 05/09/2013  . Abdominal pain, epigastric 03/29/2013  . Myalgia 12/21/2012  . Chronic daily headache 05/24/2012    . Depression 07/14/2011  . Anxiety 06/02/2011  . Lumbar spondylosis with myelopathy 11/20/2010  . Coronary atherosclerosis 03/10/2010  . Neck pain 06/05/2009  . Dyslipidemia 12/11/2008  . Vitamin D deficiency 11/26/2008  . Uncontrolled type 2 diabetes mellitus (HCC) 05/25/2007  . PERIPHERAL NEUROPATHY 05/25/2007  . Essential hypertension 05/25/2007  . GERD 05/25/2007  . Sleep apnea 05/25/2007    Past Surgical History:  Procedure Laterality Date  . ABDOMINAL HYSTERECTOMY    . APPENDECTOMY    . BOWEL RESECTION N/A 01/10/2013   Procedure: SMALL BOWEL RESECTION;  Surgeon: Lodema Pilot, DO;  Location: [facility] OR;  Service: General;  Laterality: N/A;  . CARPAL TUNNEL RELEASE Right 09/13/2014   Procedure: RIGHT WRIST CARPAL TUNNEL RELEASE;  Surgeon: Eulas Post, MD;  Location: [facility];  Service: Orthopedics;  Laterality: Right;  . CARPAL TUNNEL RELEASE Left 02/28/2015   Procedure: LEFT CARPAL TUNNEL RELEASE;  Surgeon: Teryl Lucy, MD;  Location: [facility];  Service: Orthopedics;  Laterality: Left;  . CATARACT EXTRACTION W/ INTRAOCULAR LENS  IMPLANT, BILATERAL    . COLONOSCOPY    . HAND SURGERY  1992   rt  . HERNIA REPAIR  03/01/14   Lap incisional hernia repair w/mesh  . INCISIONAL HERNIA REPAIR N/A 03/01/2014   Procedure: LAPAROSCOPIC INCISIONAL HERNIA;  Surgeon: Axel Filler, MD;  Location: [facility];  Service: General;  Laterality: N/A;  . INSERTION OF MESH N/A 03/01/2014   Procedure: INSERTION OF MESH;  Surgeon: Axel Filler, MD;  Location: [facility];  Service: General;  Laterality: N/A;  . LAPAROTOMY N/A 01/10/2013   Procedure: EXPLORATORY LAPAROTOMY;  Surgeon: Lodema Pilot, DO;  Location: [facility];  Service: General;  Laterality: N/A;  . LYSIS OF ADHESION N/A 01/10/2013   Procedure: LYSIS OF ADHESION;  Surgeon: Lodema Pilot, DO;  Location: [facility];  Service: General;  Laterality: N/A;  . MULTIPLE TOOTH EXTRACTIONS    . STERIOD INJECTION Left 09/13/2014    Procedure: STEROID INJECTION LEFT HAND;  Surgeon: Eulas Post, MD;  Location: [facility];  Service: Orthopedics;  Laterality: Left;  . TOOTH EXTRACTION Bilateral 05/13/2017   Procedure: DENTAL RESTORATION/EXTRACTIONS;  Surgeon: Ocie Doyne, DDS;  Location[facility];  Service: Oral Surgery;  Laterality: Bilateral;    OB History    No data available       Home Medications    Prior to Admission medications   Medication Sig Start Date End Date Taking? Authorizing Provider  ACCU-CHEK AVIVA PLUS test strip TEST UPTO 4 TIMES DAILY Patient taking differently: TEST UP TO 4 TIMES DAILY 06/17/15  Yes Denton Brick, MD  ACCU-CHEK FASTCLIX LANCETS MISC TEST five times a day 12/02/15  Yes Denton Brick, MD  acetaminophen (TYLENOL) 325 MG  tablet Take 650 mg by mouth 3 (three) times daily as needed for mild pain or headache.   Yes [provider]  AMITIZA 24 MCG capsule Take 24 mcg by mouth 2 (two) times daily.  06/15/16  Yes [provider]  aspirin EC 81 MG tablet Take 81 mg by mouth daily.   Yes [provider]  Biotin 5000 MCG TABS Take 5,000 mcg by mouth daily.   Yes [provider]  buPROPion (WELLBUTRIN XL) 150 MG 24 hr tablet Take 3 tablets (450 mg total) by mouth daily. 09/30/17  Yes Plovsky, Earvin Hansen, MD  cloNIDine (CATAPRES - DOSED IN MG/24 HR) 0.3 mg/24hr patch Place 1 patch (0.3 mg total) onto the skin once a week. 07/21/17  Yes Lacroce, Ames Coupe, MD  dexlansoprazole (DEXILANT) 60 MG capsule Take 1 capsule (60 mg total) by mouth daily. For reflux 11/13/14  Yes Rankin, Shuvon B, NP  furosemide (LASIX) 40 MG tablet Take 1 tablet (40 mg total) by mouth daily. 09/28/17  Yes Lacroce, Ames Coupe, MD  gabapentin (NEURONTIN) 100 MG capsule Take 1 capsule (100 mg total) by mouth 2 (two) times daily. 07/21/17  Yes Toney Rakes, MD  gabapentin (NEURONTIN) 300 MG capsule Take 2 capsules at bedtime 09/20/17  Yes Van Clines, MD  insulin aspart  (NOVOLOG) 100 UNIT/ML injection Inject 0-17 Units into the skin 3 (three) times daily with meals. Check blood sugar three times daily with meals. 11/01/17  Yes Lacroce, Ames Coupe, MD  Insulin Glargine (LANTUS SOLOSTAR) 100 UNIT/ML Solostar Pen inject subcutaneously 45 units at bedtime 07/21/17  Yes Lacroce, Ames Coupe, MD  Insulin Pen Needle (BD PEN NEEDLE NANO U/F) 32G X 4 MM MISC Use to inject insulin into the skin 07/21/17  Yes Lacroce, Ames Coupe, MD  LINZESS 145 MCG CAPS capsule Take 145 mcg by mouth daily as needed (consitpation).  06/07/16  Yes [provider]  LORazepam (ATIVAN) 0.5 MG tablet 2  qam  1 qhs Patient taking differently: Take 0.5-1 mg by mouth 2 (two) times daily. Take 2  qam and  1 qhs 09/30/17  Yes Plovsky, Earvin Hansen, MD  metoprolol succinate (TOPROL-XL) 100 MG 24 hr tablet Take 1 tablet (100 mg total) by mouth every morning. Take with or immediately following a meal. 07/21/17  Yes Lacroce, Ames Coupe, MD  Misc Natural Products (TART CHERRY ADVANCED PO) Take 1 capsule by mouth every evening.   Yes [provider]  promethazine (PHENERGAN) 25 MG tablet Take 25 mg by mouth 2 (two) times daily as needed for nausea or vomiting.  05/03/16  Yes [provider]  rosuvastatin (CRESTOR) 10 MG tablet Take 1 tablet (10 mg total) by mouth daily. 01/12/17  Yes Denton Brick, MD  sertraline (ZOLOFT) 100 MG tablet Take 2 tablets (200 mg total) by mouth at bedtime. 09/30/17  Yes Plovsky, Earvin Hansen, MD  sitaGLIPtin (JANUVIA) 50 MG tablet Take 1 tablet (50 mg total) by mouth daily. 09/28/17  Yes Lacroce, Ames Coupe, MD  TURMERIC CURCUMIN PO Take 1 tablet by mouth at bedtime.   Yes [provider]  Vitamin D, Ergocalciferol, (DRISDOL) 50000 units CAPS capsule take 1 capsule by mouth every week Patient taking differently: take 1 capsule by mouth every week on Monday 08/22/17  Yes Judi Saa, DO    Family History Family History  Problem Relation Age of Onset  .  Diabetes Mother   . Hypertension Mother   . Hyperlipidemia Mother   . Arthritis Mother   .  Depression Mother   . Heart disease Father   . Diabetes Brother   . Heart disease Brother   . Diabetes Brother   . Heart disease Paternal Grandmother     Social History Social History   Tobacco Use  . Smoking status: Former Smoker    Types: Cigarettes    Last attempt to quit: 10/25/2002    Years since quitting: 15.0  . Smokeless tobacco: Never Used  Substance Use Topics  . Alcohol use: No    Alcohol/week: 0.0 oz  . Drug use: No     Allergies   Penicillins; Cimetidine; Indomethacin; Adhesive [tape]; and Sulfamethoxazole   Review of Systems Review of Systems  Constitutional: Negative for chills and fever.  HENT: Negative for ear pain and sore throat.   Eyes: Negative for pain and visual disturbance.  Respiratory: Negative for cough and shortness of breath.   Cardiovascular: Negative for chest pain and palpitations.  Gastrointestinal: Negative for abdominal pain and vomiting.  Genitourinary: Positive for dysuria. Negative for flank pain and hematuria.  Musculoskeletal: Negative for arthralgias, back pain and neck pain.       Denies bilat knee pain  Skin: Negative for color change and rash.  Neurological: Positive for headaches (chronic). Negative for seizures, syncope, weakness and numbness.       No head trauma or loc. Ha (chronic)  All other systems reviewed and are negative.    Physical Exam Updated Vital Signs BP 106/60 (BP Location: Left Arm)   Pulse (!) 58   Temp 98.8 F (37.1 C) (Oral)   Resp 16   SpO2 99%   Physical Exam  Constitutional: She appears well-developed and well-nourished. No distress.  HENT:  Head: Normocephalic and atraumatic.  Eyes: Conjunctivae and EOM are normal. Pupils are equal, round, and reactive to light.  Neck: Normal range of motion. Neck supple.  Cardiovascular: Normal rate, regular rhythm, normal heart sounds and intact distal pulses.    No murmur heard. Pulmonary/Chest: Effort normal and breath sounds normal. No stridor. No respiratory distress. She has no wheezes.  Abdominal: Soft. Bowel sounds are normal. She exhibits no distension. There is tenderness (mildly TTP, chronic per pt). There is no rebound and no guarding.  Musculoskeletal: She exhibits no edema.  Strength 4/5 to BLE but symmetric. Sensation intact and symmetric. Strength 5/5 of BUE. Sensation intact and symmetric. No calf erythema, swelling, or TTP.  Neurological: She is alert.  Skin: Skin is warm and dry. Capillary refill takes less than 2 seconds.  Old abrasion noted to left hand. No new abrasions or ecchymosis noted.  Psychiatric: She has a normal mood and affect.  Nursing note and vitals reviewed.    ED Treatments / Results  Labs (all labs ordered are listed, but only abnormal results are displayed) Labs Reviewed  URINALYSIS, ROUTINE W REFLEX MICROSCOPIC - Abnormal; Notable for the following components:      Result Value   APPearance HAZY (*)    Glucose, UA >=500 (*)    Bacteria, UA RARE (*)    Squamous Epithelial / LPF 0-5 (*)    All other components within normal limits  URINE CULTURE  CBG MONITORING, ED    EKG  EKG Interpretation None       Radiology Dg Cervical Spine Complete  Result Date: 11/01/2017 CLINICAL DATA:  Neck and shoulder pain. EXAM: CERVICAL SPINE - COMPLETE 4+ VIEW COMPARISON:  MRI 04/24/2017. FINDINGS: C7 poorly visualized. Loss of normal cervical lordosis. No acute bony abnormality identified. No  evidence of fracture. Diffuse osteopenia. Mild diffuse degenerative change. Pulmonary apices are clear. Carotid vascular disease cannot be excluded. IMPRESSION: 1. Diffuse osteopenia and mild diffuse degenerative change. Loss of normal cervical lordosis. No acute bony abnormality identified. 2.  Carotid vascular disease cannot be excluded. Electronically Signed   By: Maisie Fus  Register   On: 11/01/2017 16:33   Dg Knee Complete 4  Views Left  Result Date: 11/01/2017 CLINICAL DATA:  Fall today.  Difficulty bearing weight. EXAM: LEFT KNEE - COMPLETE 4+ VIEW COMPARISON:  None. FINDINGS: The knee is located. Mild degenerative changes are present. No acute fracture is present. There is no significant effusion. IMPRESSION: 1. Mild degenerative changes without acute abnormality. Electronically Signed   By: Marin Roberts M.D.   On: 11/01/2017 14:38   Dg Knee Complete 4 Views Right  Result Date: 11/01/2017 CLINICAL DATA:  Fall today. Bilateral knee pain. Difficulty bearing weight. EXAM: RIGHT KNEE - COMPLETE 4+ VIEW COMPARISON:  None. FINDINGS: The right knee is located. Joint spaces are preserved. There is no significant effusion. A bone fragment along the medial femoral condyle is compatible with a Pellegrini-Stieda lesion. IMPRESSION: 1. Pellegrini-Stieda lesion. 2. No acute abnormality. Electronically Signed   By: Marin Roberts M.D.   On: 11/01/2017 14:38    Procedures Procedures (including critical care time)  Medications Ordered in ED Medications - No data to display   Initial Impression / Assessment and Plan / ED Course  I have reviewed the triage vital signs and the nursing notes.  Pertinent labs & imaging results that were available during my care of the patient were reviewed by me and considered in my medical decision making (see chart for details).   Staffed pt with Dr. Rubin Payor. He believes that the patient is stable for discharge with outpatient follow up.   Nursing ambulated pt and pt able to ambulate without assistance.  I ambulated pt and gait was normal. She was able to ambulate without assistance. No ataxia.  1705 Was informed that pt fell in bathroom. Re-evaluated pt. Pt states that she was leaning too far forward when attempting to give urine and lost her balance. She fell on her left side and did not hit her head or lose consciousness. Reports that she fell on her left elbow. Pt denies left  elbow pain or any other pain. Exam with no TTP, erythema, or abrasion to LUE. Full strength and painless ROM throughout left upper extremity. No spinal TTP to cervical, thoracic, or lumbar spine. FROM of neck. Ambulated pt again down the hall and back without assistance and pt had normal gait. There was no ataxia and she showed no evidence of weakness to the BLE. Discussed the results of the imaging studies and plan for discharge home. Advised patient that she needs to use her walker at home to avoid falls. Advised Pcp f/u in 3 days and strict return precautions given. All questions answered. Pt is eager to leave and is asking when she can go home.   Final Clinical Impressions(s) / ED Diagnoses   Final diagnoses:  Fall, initial encounter  Knee injury, unspecified laterality, initial encounter   67 y/o F presents today s/p mechanical fall onto knees. No reported knee pain and no knee pain on exam. bilat knee xrays neg for acute abnormality. Also mechanical fall today in hospital bathroom with no injuries. Low suspicion that falls were due progression of chronic lumbar facet arthritis. Pt was just seen by her sports medicine physician today who noted that  this issue is chronic and is currently stable at this time. Pt denied back pain on my hx, no focal neurologic deficits and pt is ambulatory with normal gait. No urinary/bowel incontinence, saddle anesthesia or LE numbness. Fall likely secondary to pt not using walker and I strongly advised her to use her walker at home on a regular basis. Advised pcp f/u within 3 days and strict return precautions given.    ED Discharge Orders    None       Rayne DuCouture, Khrystyna Schwalm S, PA-C 11/02/17 Lloyd [id number]    Pickering, Nathan, MD 11/02/17 [phone]

## 2017-11-01 NOTE — ED Triage Notes (Signed)
Per [facility] patient from home for fall today c/o bilat knee pain. Patient has can/walker but doesn't use it. Denies any LOC or blood thinners.

## 2017-11-01 NOTE — Patient Instructions (Addendum)
Good to see you  Gustavus Bryantce is your friend.  Exercises 3 times a week.  Xray downstairs Keep doing the vitamins On wall with heels, butt shoulder and head touching for a goal of 5 minutes daily  See me again in 3-4 weeks.

## 2017-11-01 NOTE — Patient Instructions (Addendum)
You gave you  rself 10 units of Novolog today for your blood sugar of 513 mg/dl.   Right now just working on helping your feel better by trying to lower your blood sugars.   Please check your blood sugar today and as much as possible for the next week to see if it is getting lower.   Your goal is to check it 1-2 times a day and bring your meter with you next week.   Nice to you!  Lupita LeashDonna [phone]

## 2017-11-01 NOTE — Discharge Instructions (Addendum)
Please follow up with your primary doctor in 3 days for re-evaluation. Make sure to use your walker at home to maintain your balance. Return to the emergency room if you have any new or worsening symptoms including more frequent falls, weakness/numbness/tingling to your legs, urinary/bowel incontinence, Cp, SOB, or any new or worsening symptoms.

## 2017-11-01 NOTE — Addendum Note (Signed)
Addended by: Toney RakesLACROCE, Orel Cooler J on: 11/01/2017 02:29 PM   Modules accepted: Orders

## 2017-11-01 NOTE — Assessment & Plan Note (Signed)
Has had neck pain.  I believe this is secondary to more poor posture.  X-rays ordered today to make sure that there is no potential spinal stenosis that could be contributing.  Home exercises given, discussed icing regimen.  Patient will follow-up again in 4 weeks

## 2017-11-01 NOTE — ED Notes (Signed)
Pt ambulated to the restroom with no assistance, staff heard the pull cord going off in the restroom, staff then went to check on pt to find her laying on the floor, pt states she only hit her elbow and that nothing else was hurting. 3 staff members proceeded to help the pt to her feet and to help her finish using the restroom, staff members then walked with pt back to her bed to make sure she didn't fall again.

## 2017-11-03 ENCOUNTER — Encounter: Payer: Self-pay | Admitting: Internal Medicine

## 2017-11-03 ENCOUNTER — Ambulatory Visit (INDEPENDENT_AMBULATORY_CARE_PROVIDER_SITE_OTHER): Payer: Medicare Other | Admitting: Internal Medicine

## 2017-11-03 VITALS — BP 129/60 | HR 67 | Temp 98.6°F | Wt 201.1 lb

## 2017-11-03 DIAGNOSIS — R441 Visual hallucinations: Secondary | ICD-10-CM | POA: Diagnosis not present

## 2017-11-03 DIAGNOSIS — R296 Repeated falls: Secondary | ICD-10-CM | POA: Diagnosis not present

## 2017-11-03 DIAGNOSIS — F339 Major depressive disorder, recurrent, unspecified: Secondary | ICD-10-CM | POA: Diagnosis not present

## 2017-11-03 DIAGNOSIS — F324 Major depressive disorder, single episode, in partial remission: Secondary | ICD-10-CM

## 2017-11-03 LAB — URINE CULTURE: Culture: <10000 — AB

## 2017-11-03 LAB — GLUCOSE, CAPILLARY: GLUCOSE-CAPILLARY: 257 mg/dL — AB (ref 65–99)

## 2017-11-03 NOTE — Patient Instructions (Addendum)
Nice to meet you today [name].  We are going to put in a referral for physical therapy and someone to come out to her home to see how you are walking there and if there are ways to make you more safe and keep from falling.    Be sure to give your psychiatrist a call and try to get an appointment for sooner since you seeing things. Their number is [phone]

## 2017-11-03 NOTE — Progress Notes (Signed)
CC: Fall, hallucinations    HPI:  [name] is a 67 y.o. female with a past medical history as described below who presents to the clinic following a fall and also with complaints of visual hallucinations.   She was evaluated in the ED on 1/8 after a fall at her house.  She fell to her knees and did not hit her head, lose consciousness or report any prodromal symptoms.  She also fell in the bathroom of the ED and again denied any preceding symptoms or injury as a result.  She has a walker at home that she was not using at the time of her initial fall or in the ED.  Bilateral knee x-rays and C-spine x-rays negative and she was discharged home with follow-up in clinic.  She was also seen in the clinic on 12/20 with another fall at home.  At that time she stated she was enrolled in physical therapy.  Today, she denies any further falls and continues to deny any light-headedness/dizziness or other prodromal sx leading to fall, LOC, acute injury, or new sx related to her fall. She states over the last  she feels generally weak at times and feels she is not able to lift or hold things as well. She states she was last in physical therapy in August '18 which is inconsistent with what she reported after her last visit for a fall.   Depression: She has a history of depression being managed by psychiatry, last seen 12/07. She denies worsening depressed mood or anxiety, does note feelings of irritation and anger toward who husband who regularly uses alcohol. She also notes the recent onset of visual hallucinations. She states she periodically will see people in her house, but she knows nobody is really there. She has also seen a child in bed with her. She feels safe at home, denies SI or HI, no auditory hallucinations.   Past Medical History:  Diagnosis Date  . Anemia   . Arthritis   . Borderline personality disorder (HCC)   . CAD (coronary artery disease)    Catherization 11/18/09>nonobstructive  CAD , normal LV function, EF 65%  . Cervicalgia   . Chronic kidney disease (CKD), stage IV (severe) (HCC)   . Dental caries    periodontitis and mandibular tori  . Depression   . Diabetes mellitus   . Diabetes mellitus, type II (HCC)   . Diabetic gastroparesis (HCC)   . Diastolic CHF (HCC)    Grade I, on Echo 06/2013, EF 55-60%  . Dyspnea   . Fibromyalgia   . GERD (gastroesophageal reflux disease)   . Gout   . Headache(784.0)   . Health maintenance examination    Colonoscopy 2009> normal; Diabetic eye exam 12/2008. No diabetic retinopathy; Mammogram 11/10> No evidence of malignancy, DXA 03/14/13 : normal  . History of hiatal hernia   . Hyperlipidemia   . Hypertension   . IBS (irritable bowel syndrome)   . Iliotibial band syndrome   . Low back pain syndrome   . Nonorganic psychosis (HCC)   . Peripheral neuropathy   . Right carpal tunnel syndrome 09/13/2014  . SBO (small bowel obstruction) (HCC) 01/09/2013  . Sleep apnea    does not wear CPAP   . Suicide attempt [facility])    " by overdose of whatever I had"  . Vitamin B12 deficiency   . Vitamin D deficiency   . Wears dentures    upper  . Wears glasses   .  Wears glasses    Review of Systems:  Review of Systems  Musculoskeletal: Positive for falls.  Neurological: Negative for dizziness, focal weakness and loss of consciousness.  Psychiatric/Behavioral: Positive for depression and hallucinations. Negative for suicidal ideas.     Physical Exam:  Vitals:   11/03/17 1325  BP: 129/60  Pulse: 67  Temp: 98.6 F (37 C)  TempSrc: Oral  SpO2: 96%  Weight: 201 lb 1.6 oz (91.2 kg)   General: Sitting in chair comfortably, no acute distress HEENT: Moist mucus membranes, EOMI, PERRL  CV: RRR Resp: Clear breath sounds, normal work of breathing, no distress  Neuro: Alert and oriented x3, CN intact, normal coordination, sensation intact, 5/5 strength in upper and lower extremities bilaterally  Skin: Warm, dry      Assessment &  Plan:   See Encounters Tab for problem based charting.  Patient discussed with Dr. Heide SparkNarendra

## 2017-11-07 NOTE — Assessment & Plan Note (Signed)
Pt presents after sustaining further mechanical falls for which she was evaluated in the ED with a reassuring workup. She previously stated she was in physical therapy at the time of another prior fall but on further questioning today this appears to be inaccurate and she has not had physical therapy in around 5 months. She continues to deny sx suggestive of prodrome or orthostatic component to her falls, no neurologic deficits on exam in ED or today. She would likely benefit from further physical therapy, as well as a home assessment for any potential modifications that could increase pt safety and home and prevent further falls.   --[facility] referral

## 2017-11-07 NOTE — Progress Notes (Signed)
Internal Medicine Clinic Attending  Case discussed with Dr. Harden at the time of the visit.  We reviewed the resident's history and exam and pertinent patient test results.  I agree with the assessment, diagnosis, and plan of care documented in the resident's note.  

## 2017-11-07 NOTE — Assessment & Plan Note (Addendum)
She has a history of depression currently being managed by Psychiatry, she is taking all medications as prescribed. She reports her mood is stable with no SI or HI, only irritation with her husband. However, she has had the recent onset of visual hallucinations of seeing people in her home or a child in her bed, currently non-threatening, denies auditory hallucinations. She has f/u scheduled with Psychiatry, however, she was encouraged to contact her psychiatry office to notify them of her sx and schedule a follow up appointment for sooner in the interval given her new psychotic features.   --Defer management changes to Psychiatry

## 2017-11-08 ENCOUNTER — Ambulatory Visit
Admission: RE | Admit: 2017-11-08 | Discharge: 2017-11-08 | Disposition: A | Discharge status: Home / Self Care | Payer: Medicare Other | Source: Ambulatory Visit | Attending: Internal Medicine | Admitting: Internal Medicine

## 2017-11-08 ENCOUNTER — Ambulatory Visit: Payer: Self-pay | Admitting: Dietician

## 2017-11-08 ENCOUNTER — Telehealth: Payer: Self-pay | Admitting: *Deleted

## 2017-11-08 DIAGNOSIS — E1121 Type 2 diabetes mellitus with diabetic nephropathy: Secondary | ICD-10-CM | POA: Diagnosis not present

## 2017-11-08 DIAGNOSIS — Z1231 Encounter for screening mammogram for malignant neoplasm of breast: Secondary | ICD-10-CM | POA: Diagnosis not present

## 2017-11-08 NOTE — Telephone Encounter (Signed)
[facility] and [facility] be evaluated for multiple falls, triage ask pt when she had last fallen and pt could not remember, she seemed confused by the question, appt [facility] was made for tomorrow and then she stated she might have to get SC[facility] to bring her. She also stated she did not really remember what the PT told her or his name. Very puzzling conversation

## 2017-11-08 NOTE — Telephone Encounter (Signed)
Thanks [employer] StoresHelen. I'll be looking out for the [facility] progress note.

## 2017-11-09 ENCOUNTER — Other Ambulatory Visit: Payer: Self-pay

## 2017-11-09 ENCOUNTER — Ambulatory Visit (INDEPENDENT_AMBULATORY_CARE_PROVIDER_SITE_OTHER): Payer: Medicare Other | Admitting: Internal Medicine

## 2017-11-09 ENCOUNTER — Ambulatory Visit
Admission: RE | Admit: 2017-11-09 | Discharge: 2017-11-09 | Disposition: A | Discharge status: Home / Self Care | Payer: Medicare Other | Source: Ambulatory Visit | Attending: Physician Assistant | Admitting: Physician Assistant

## 2017-11-09 ENCOUNTER — Encounter: Payer: Self-pay | Admitting: Internal Medicine

## 2017-11-09 VITALS — BP 150/65 | HR 70 | Temp 98.5°F | Ht 65.0 in | Wt 195.0 lb

## 2017-11-09 DIAGNOSIS — E1140 Type 2 diabetes mellitus with diabetic neuropathy, unspecified: Secondary | ICD-10-CM | POA: Diagnosis not present

## 2017-11-09 DIAGNOSIS — E1165 Type 2 diabetes mellitus with hyperglycemia: Secondary | ICD-10-CM

## 2017-11-09 DIAGNOSIS — Z79899 Other long term (current) drug therapy: Secondary | ICD-10-CM | POA: Diagnosis not present

## 2017-11-09 DIAGNOSIS — R1310 Dysphagia, unspecified: Secondary | ICD-10-CM

## 2017-11-09 DIAGNOSIS — Z794 Long term (current) use of insulin: Secondary | ICD-10-CM

## 2017-11-09 DIAGNOSIS — R296 Repeated falls: Secondary | ICD-10-CM

## 2017-11-09 DIAGNOSIS — R110 Nausea: Secondary | ICD-10-CM

## 2017-11-09 MED ORDER — INSULIN LISPRO 100 UNIT/ML (KWIKPEN): 10.0000 [IU] | PEN_INJECTOR | Freq: Three times a day (TID) | SUBCUTANEOUS | 11 refills | Status: AC

## 2017-11-09 NOTE — Assessment & Plan Note (Addendum)
Patient presenting for repeat evaluation after home health PT referral placed at last visit with initial evaluation having taken place and ongoing care scheduled to continue.  She did use the restroom during her clinic visit and requires assistance to avoid another fall.  She did not have her walker with her today and states she uses it sparingly.  Her neurologic exam continues to be unremarkable and no suggestion of any prodromal symptoms.  Poor diabetic control and her known neuropathy may also be a contributing factor.  She was strongly encouraged to use her walker at all times despite its inconvenience to continue working with physical therapy.  We will continue to monitor her symptoms for any changes to suggest other causes.  She is on multiple doses of gabapentin which per patient are being used for headache control and neuropathy.  At follow-up, can consider eliminating daytime doses of gabapentin in case this contributes to her falls.

## 2017-11-09 NOTE — Progress Notes (Signed)
CC: Recurrent falls  HPI:  [name] is a 67 y.o. female with a past medical history as described below who presents to clinic for follow-up of recurrent falls.  She was seen on 1/10 for follow-up of a fall for which she was evaluated in the ED. at that time, home health referral for physical therapy was placed in order to increase patient safety and make any adjustments in her home environment to decrease the likelihood of falls.  Yesterday she called the clinic and stated the home health physical therapist suggested that she be evaluated for her blood sugar.   Since last visit, she states she has fallen two more times while picking things up from beside her bed. She falls to her knees and uses her bed to get back up, she denies any injuries, striking her head, or prodromal symptoms. She states [facility] primarily consisted of the therapist watching her move about her home and he will return in ~3 days. She has not been using her walker as she feels it is awkward in some spaces.   DM: She has poorly controlled diabetes, currently taking Lantus 45 U nightly. She states she was previously prescribed meal time insulin but did not do well with this regimen because she did not count carbs well-it does remain on her medication list. Her blood glucose was in the 200s at her last visit several days ago.   Past Medical History:  Diagnosis Date  . Anemia   . Arthritis   . Borderline personality disorder (HCC)   . CAD (coronary artery disease)    Catherization 11/18/09>nonobstructive CAD , normal LV function, EF 65%  . Cervicalgia   . Chronic kidney disease (CKD), stage IV (severe) (HCC)   . Dental caries    periodontitis and mandibular tori  . Depression   . Diabetes mellitus   . Diabetes mellitus, type II (HCC)   . Diabetic gastroparesis (HCC)   . Diastolic CHF (HCC)    Grade I, on Echo 06/2013, EF 55-60%  . Dyspnea   . Fibromyalgia   . GERD (gastroesophageal reflux  disease)   . Gout   . Headache(784.0)   . Health maintenance examination    Colonoscopy 2009> normal; Diabetic eye exam 12/2008. No diabetic retinopathy; Mammogram 11/10> No evidence of malignancy, DXA 03/14/13 : normal  . History of hiatal hernia   . Hyperlipidemia   . Hypertension   . IBS (irritable bowel syndrome)   . Iliotibial band syndrome   . Low back pain syndrome   . Nonorganic psychosis (HCC)   . Peripheral neuropathy   . Right carpal tunnel syndrome 09/13/2014  . SBO (small bowel obstruction) (HCC) 01/09/2013  . Sleep apnea    does not wear CPAP   . Suicide attempt [facility](HCC)    " by overdose of whatever I had"  . Vitamin B12 deficiency   . Vitamin D deficiency   . Wears dentures    upper  . Wears glasses   . Wears glasses    Review of Systems:  Review of Systems  Constitutional: Negative for fever and weight loss.  Musculoskeletal: Positive for falls.  Neurological: Negative for dizziness and loss of consciousness.     Physical Exam:  Vitals:   11/09/17 1337  BP: (!) 150/65  Pulse: 70  Temp: 98.5 F (36.9 C)  TempSrc: Oral  SpO2: 98%  Weight: 195 lb (88.5 kg)  Height: 5\' 5"  (1.651 m)   General: Elderly  female sitting in chair comfortably, no acute distress HEENT: Normal conjuctiva CV: RRR Resp: CTAB, normal work of breathing, no distress  Extr: No TTP of bilateral knees, no effusions appreciated  Neuro: Alert and oriented x3, 5/5 strength to bilateral upper and LEs  Skin: Warm, dry      Assessment & Plan:   See Encounters Tab for problem based charting.  Patient discussed with Dr. Cleda Daub

## 2017-11-09 NOTE — Patient Instructions (Addendum)
FOLLOW-UP INSTRUCTIONS When: 2-3 weeks  For: Diabetes and falls follow up  What to bring: Glucose meter   Good to see you again [name].   For your falls, it will be important to continue working with the physical therapist to improve your strength and balance.   REMEMBER TO ALWAYS USE YOUR WALKER!!   It will be very important to always use your walker in public and at home even if it is inconvenient to try to prevent falls.   Your blood sugar also needs to be better than it is now. We will put you back on insulin to take with meals but you can do the same dose with each meal to keep it simple and not have to count carbs. Getting your blood sugar under control could help with the nerves in your feet and hopefully help your balance or keep it from getting worse.   Continue to take your Lantus 45 U at night.   Start taking Humalog Kwikpen 10 U three times a day right before each meal.   Continue checking your blood sugar regularly, three times a day before your meals would be ideal and bring your meter so we can see how you're doing. We'll see you back in two weeks

## 2017-11-10 DIAGNOSIS — G4731 Primary central sleep apnea: Secondary | ICD-10-CM | POA: Diagnosis not present

## 2017-11-10 NOTE — Assessment & Plan Note (Signed)
Last A1c checked in December was 9.6 with dietary indiscretion contributing to lack of control at that time.  She is currently on Januvia 50 mg and Lantus 45 units nightly.  Her meter download today showed all of her values above target range with the lowest value being 318.  While she was noted to be previously controlled on this regimen she may need mealtime coverage as dietary changes since December do not appear to have been made.  She was previously prescribed a sliding scale 0-17 units NovoLog according to medication list but she states this was difficult for her.  We will add a short acting insulin for mealtime coverage with a set dose for simplicity and she was instructed to check her blood sugar regularly, ideally 3 times a day before meals.  Follow-up in 2-3 weeks for assessment of blood glucose after regimen change.  She was instructed by pharmacy how to use the Humalog KwikPen that was prescribed.

## 2017-11-11 DIAGNOSIS — M797 Fibromyalgia: Secondary | ICD-10-CM | POA: Diagnosis not present

## 2017-11-11 DIAGNOSIS — D649 Anemia, unspecified: Secondary | ICD-10-CM | POA: Diagnosis not present

## 2017-11-11 DIAGNOSIS — I509 Heart failure, unspecified: Secondary | ICD-10-CM | POA: Diagnosis not present

## 2017-11-11 DIAGNOSIS — R262 Difficulty in walking, not elsewhere classified: Secondary | ICD-10-CM | POA: Diagnosis not present

## 2017-11-11 DIAGNOSIS — E1121 Type 2 diabetes mellitus with diabetic nephropathy: Secondary | ICD-10-CM | POA: Diagnosis not present

## 2017-11-11 DIAGNOSIS — M6281 Muscle weakness (generalized): Secondary | ICD-10-CM | POA: Diagnosis not present

## 2017-11-11 DIAGNOSIS — N184 Chronic kidney disease, stage 4 (severe): Secondary | ICD-10-CM | POA: Diagnosis not present

## 2017-11-15 ENCOUNTER — Encounter: Payer: Self-pay | Admitting: Dietician

## 2017-11-15 ENCOUNTER — Ambulatory Visit (INDEPENDENT_AMBULATORY_CARE_PROVIDER_SITE_OTHER): Payer: Medicare Other | Admitting: Dietician

## 2017-11-15 DIAGNOSIS — Z794 Long term (current) use of insulin: Secondary | ICD-10-CM

## 2017-11-15 DIAGNOSIS — M797 Fibromyalgia: Secondary | ICD-10-CM | POA: Diagnosis not present

## 2017-11-15 DIAGNOSIS — E1165 Type 2 diabetes mellitus with hyperglycemia: Secondary | ICD-10-CM | POA: Diagnosis not present

## 2017-11-15 DIAGNOSIS — D649 Anemia, unspecified: Secondary | ICD-10-CM | POA: Diagnosis not present

## 2017-11-15 DIAGNOSIS — Z713 Dietary counseling and surveillance: Secondary | ICD-10-CM

## 2017-11-15 DIAGNOSIS — N184 Chronic kidney disease, stage 4 (severe): Secondary | ICD-10-CM | POA: Diagnosis not present

## 2017-11-15 DIAGNOSIS — R262 Difficulty in walking, not elsewhere classified: Secondary | ICD-10-CM | POA: Diagnosis not present

## 2017-11-15 DIAGNOSIS — I509 Heart failure, unspecified: Secondary | ICD-10-CM | POA: Diagnosis not present

## 2017-11-15 DIAGNOSIS — E1121 Type 2 diabetes mellitus with diabetic nephropathy: Secondary | ICD-10-CM | POA: Diagnosis not present

## 2017-11-15 DIAGNOSIS — M6281 Muscle weakness (generalized): Secondary | ICD-10-CM | POA: Diagnosis not present

## 2017-11-15 NOTE — Patient Instructions (Addendum)
[facility]  Your appointment with Shanda BumpsJessica: Wednesday-  January 04, 2018 8 :00 AM  Address: [address], [address], [place], [place] [address] Building across [address]  Phone: [phone]  Please put an appointment time on your calendar to remind you to take your insulin at dinner time - 10 units  Try to check your blood sugar more often- especially in the morning, before you take the insulin and at bedtime to be sure you are not too low

## 2017-11-15 NOTE — Progress Notes (Signed)
  Medical Nutrition Therapy:  Appt start time: 1020 end time:  1050. Visit # 2 in 2019  Assessment:  Primary concerns today: "I don't want to count calories" " I want to be able to eat what I want"  Ms. [name] has her meter and Humalog pen with her today.  She reports having taken Humalog 10 units about 3 times since it was prescribed ( this matches the amount  missing from her pen). Her CBG was 397 today in the office after drinking cocoa x 1 cup and not taking Humalog.    ANTHROPOMETRICS: weight- declined today  SLEEP:not assessed today.  Food Intolerances, constipation: need to assess at future visit  MEDICATIONS: she reports taking all diabetes medicine as ordered BLOOD SUGAR:meter download:  Tested 14 times in past 30 days, range is 318- 600 mg/dl.   DIETARY INTAKE: Usual eating pattern includes 1-3 meals and 1-3 snacks per day. Need to assess dining out frequency.    24-hr recall , she says she eats fruit daily,  9:30 AM: 2 waffles & syrup ( she says is her most balanced meal) Water 5 PM plate of tortilla chips and salsa   7 PM: pigs feet and pintos  Progress Towards Goal(s):  In progress.   Nutritional Diagnosis:  [place]-2.3 Food-medication interaction As related to insufficient insulin intake to cover and control for her carb intake with little change  As evidenced by her meter download and report of taking meal time insulin one time a day and eating "what I want".     Intervention:  Nutrition counseling about working on small steps to help her feel better such as taking her insulin as prescribed.  Coordination of care: assisted her with scheduling an appointment woith her counselor/therapist.  Teaching Method Utilized: Visual,Auditory,Hands on, Handouts given during visit include: AVS Barriers to learning/adherence to lifestyle change: depression, competing values Demonstrated degree of understanding via:  Teach Back   Monitoring/Evaluation:  Dietary intake, exercise, meter by  phone in 1 week(s). Norm Parcelonna Plyler, RD 11/15/2017 4:12 PM.

## 2017-11-16 NOTE — Progress Notes (Signed)
Internal Medicine Clinic Attending  Case discussed with Dr. Harden at the time of the visit.  We reviewed the resident's history and exam and pertinent patient test results.  I agree with the assessment, diagnosis, and plan of care documented in the resident's note.  

## 2017-11-17 DIAGNOSIS — M6281 Muscle weakness (generalized): Secondary | ICD-10-CM | POA: Diagnosis not present

## 2017-11-17 DIAGNOSIS — D649 Anemia, unspecified: Secondary | ICD-10-CM | POA: Diagnosis not present

## 2017-11-17 DIAGNOSIS — N184 Chronic kidney disease, stage 4 (severe): Secondary | ICD-10-CM | POA: Diagnosis not present

## 2017-11-17 DIAGNOSIS — M797 Fibromyalgia: Secondary | ICD-10-CM | POA: Diagnosis not present

## 2017-11-17 DIAGNOSIS — E1121 Type 2 diabetes mellitus with diabetic nephropathy: Secondary | ICD-10-CM | POA: Diagnosis not present

## 2017-11-17 DIAGNOSIS — I509 Heart failure, unspecified: Secondary | ICD-10-CM | POA: Diagnosis not present

## 2017-11-17 DIAGNOSIS — R262 Difficulty in walking, not elsewhere classified: Secondary | ICD-10-CM | POA: Diagnosis not present

## 2017-11-21 ENCOUNTER — Telehealth: Payer: Self-pay | Admitting: Dietician

## 2017-11-22 ENCOUNTER — Encounter: Payer: Self-pay | Admitting: Family Medicine

## 2017-11-22 ENCOUNTER — Ambulatory Visit (INDEPENDENT_AMBULATORY_CARE_PROVIDER_SITE_OTHER): Payer: Medicare Other | Admitting: Family Medicine

## 2017-11-22 VITALS — BP 132/64 | HR 70 | Ht 65.0 in | Wt 191.0 lb

## 2017-11-22 DIAGNOSIS — M542 Cervicalgia: Secondary | ICD-10-CM

## 2017-11-22 DIAGNOSIS — M5030 Other cervical disc degeneration, unspecified cervical region: Secondary | ICD-10-CM | POA: Insufficient documentation

## 2017-11-22 DIAGNOSIS — M9981 Other biomechanical lesions of cervical region: Secondary | ICD-10-CM

## 2017-11-22 DIAGNOSIS — M999 Biomechanical lesion, unspecified: Secondary | ICD-10-CM

## 2017-11-22 NOTE — Assessment & Plan Note (Signed)
Decision today to treat with OMT was based on Physical Exam  After verbal consent patient was treated with HVLA, ME, FPR techniques in cervical, thoracic, rib areas  Patient tolerated the procedure well with improvement in symptoms  Patient given exercises, stretches and lifestyle modifications  See medications in patient instructions if given  Patient will follow up in 4 weeks 

## 2017-11-22 NOTE — Patient Instructions (Signed)
Good to see you  Try the inhaler daily one time for the cough  If not better in a week then you need to be seen for the cough Refilled the gabapentin again  PT will be calling you  See me again for the neck and manipulation in 3 weeks

## 2017-11-22 NOTE — Progress Notes (Signed)
Tawana ScaleZach Fadia Marlar D.O. [facility] [address] [place], [place] [address] Phone: [phone] Subjective:    CC: Neck pain  BJY:NWGNFAOZHYHPI:Subjective  [name] is a 67 y.o. female coming in for neck pain. She has been having headaches that was told by a neurologist that her neck may be the cause. She has been experiencing headaches daily and this has been getting worse over the past 3 months.  Has been seen neurology.  Not taking the increased  Deny radiation of the pain. Rates severity 5/10   Patient did have cervical x-rays done January 2019 Found to have diffuse DDD, loss of lordosis.       Past Medical History:  Diagnosis Date  . Anemia   . Arthritis   . Borderline personality disorder (HCC)   . CAD (coronary artery disease)    Catherization 11/18/09>nonobstructive CAD , normal LV function, EF 65%  . Cervicalgia   . Chronic kidney disease (CKD), stage IV (severe) (HCC)   . Dental caries    periodontitis and mandibular tori  . Depression   . Diabetes mellitus   . Diabetes mellitus, type II (HCC)   . Diabetic gastroparesis (HCC)   . Diastolic CHF (HCC)    Grade I, on Echo 06/2013, EF 55-60%  . Dyspnea   . Fibromyalgia   . GERD (gastroesophageal reflux disease)   . Gout   . Headache(784.0)   . Health maintenance examination    Colonoscopy 2009> normal; Diabetic eye exam 12/2008. No diabetic retinopathy; Mammogram 11/10> No evidence of malignancy, DXA 03/14/13 : normal  . History of hiatal hernia   . Hyperlipidemia   . Hypertension   . IBS (irritable bowel syndrome)   . Iliotibial band syndrome   . Low back pain syndrome   . Nonorganic psychosis (HCC)   . Peripheral neuropathy   . Right carpal tunnel syndrome 09/13/2014  . SBO (small bowel obstruction) (HCC) 01/09/2013  . Sleep apnea    does not wear CPAP   . Suicide attempt [facility](HCC)    " by overdose of whatever I had"  . Vitamin B12 deficiency   . Vitamin D deficiency   . Wears dentures    upper  . Wears glasses    . Wears glasses    Past Surgical History:  Procedure Laterality Date  . ABDOMINAL HYSTERECTOMY    . APPENDECTOMY    . BOWEL RESECTION N/A 01/10/2013   Procedure: SMALL BOWEL RESECTION;  Surgeon: Lodema PilotBrian Layton, DO;  Location: [facility] OR;  Service: General;  Laterality: N/A;  . CARPAL TUNNEL RELEASE Right 09/13/2014   Procedure: RIGHT WRIST CARPAL TUNNEL RELEASE;  Surgeon: Eulas PostJoshua P Landau, MD;  Location: [facility];  Service: Orthopedics;  Laterality: Right;  . CARPAL TUNNEL RELEASE Left 02/28/2015   Procedure: LEFT CARPAL TUNNEL RELEASE;  Surgeon: Teryl LucyJoshua Landau, MD;  Location: [facility];  Service: Orthopedics;  Laterality: Left;  . CATARACT EXTRACTION W/ INTRAOCULAR LENS  IMPLANT, BILATERAL    . COLONOSCOPY    . HAND SURGERY  1992   rt  . HERNIA REPAIR  03/01/14   Lap incisional hernia repair w/mesh  . INCISIONAL HERNIA REPAIR N/A 03/01/2014   Procedure: LAPAROSCOPIC INCISIONAL HERNIA;  Surgeon: Axel FillerArmando Ramirez, MD;  Location: [facility];  Service: General;  Laterality: N/A;  . INSERTION OF MESH N/A 03/01/2014   Procedure: INSERTION OF MESH;  Surgeon: Axel FillerArmando Ramirez, MD;  Location: [facility];  Service: General;  Laterality: N/A;  . LAPAROTOMY N/A  01/10/2013   Procedure: EXPLORATORY LAPAROTOMY;  Surgeon: Lodema Pilot, DO;  Location: [facility] OR;  Service: General;  Laterality: N/A;  . LYSIS OF ADHESION N/A 01/10/2013   Procedure: LYSIS OF ADHESION;  Surgeon: Lodema Pilot, DO;  Location: [facility] OR;  Service: General;  Laterality: N/A;  . MULTIPLE TOOTH EXTRACTIONS    . STERIOD INJECTION Left 09/13/2014   Procedure: STEROID INJECTION LEFT HAND;  Surgeon: Eulas Post, MD;  Location: [facility];  Service: Orthopedics;  Laterality: Left;  . TOOTH EXTRACTION Bilateral 05/13/2017   Procedure: DENTAL RESTORATION/EXTRACTIONS;  Surgeon: Ocie Doyne, DDS;  Location: [facility] OR;  Service: Oral Surgery;  Laterality: Bilateral;   Social History   Socioeconomic History  . Marital  status: Married    Spouse name: larance  . Number of children: 2  . Years of education: Not on file  . Highest education level: High school graduate  Social Needs  . Financial resource strain: Not hard at all  . Food insecurity - worry: Never true  . Food insecurity - inability: Never true  . Transportation needs - medical: No  . Transportation needs - non-medical: No  Occupational History    Comment: retired  Tobacco Use  . Smoking status: Former Smoker    Types: Cigarettes    Last attempt to quit: 10/25/2002    Years since quitting: 15.0  . Smokeless tobacco: Never Used  Substance and Sexual Activity  . Alcohol use: No    Alcohol/week: 0.0 oz  . Drug use: No  . Sexual activity: Not Currently  Other Topics Concern  . Not on file  Social History Narrative  . Not on file   Allergies  Allergen Reactions  . Penicillins Hives, Itching and Other (See Comments)    Gets delusional and gets hives  Has patient had a PCN reaction causing immediate rash, facial/tongue/throat swelling, SOB or lightheadedness with hypotension: Yes Has patient had a PCN reaction causing severe rash involving mucus membranes or skin necrosis: No Has patient had a PCN reaction that required hospitalization No Has patient had a PCN reaction occurring within the last 10 years: No If all of the above answers are "NO", then may proceed with Cephalosporins  . Cimetidine Other (See Comments)    Tagamet-Breast swelling  . Indomethacin Diarrhea    Caused severe diarrhea with episodes of incontinance  . Adhesive [Tape] Other (See Comments)    Causes bruises Okay to use paper tape only  . Sulfamethoxazole Other (See Comments)    Unknown reaction    Family History  Problem Relation Age of Onset  . Diabetes Mother   . Hypertension Mother   . Hyperlipidemia Mother   . Arthritis Mother   . Depression Mother   . Heart disease Father   . Diabetes Brother   . Heart disease Brother   . Diabetes Brother   .  Heart disease Paternal Grandmother      Past medical history, social, surgical and family history all reviewed in electronic medical record.  No pertanent information unless stated regarding to the chief complaint.   Review of Systems:Review of systems updated and as accurate as of 11/22/17  No headache, visual changes, nausea, vomiting, diarrhea, constipation, dizziness, abdominal pain, skin rash, fevers, chills, night sweats, weight loss, swollen lymph nodes, body aches, joint swelling, muscle aches, chest pain, shortness of breath, mood changes.   Objective  There were no vitals taken for this visit. Systems examined below as of 11/22/17   General: No  apparent distress alert and oriented x3 mood and affect normal, dressed appropriately.  HEENT: Pupils equal, extraocular movements intact  Respiratory: Patient's speak in full sentences and does not appear short of breath  Cardiovascular: No lower extremity edema, non tender, no erythema  Skin: Warm dry intact with no signs of infection or rash on extremities or on axial skeleton.  Abdomen: Soft nontender  Neuro: Cranial nerves II through XII are intact, neurovascularly intact in all extremities with 2+ DTRs and 2+ pulses.  Lymph: No lymphadenopathy of posterior or anterior cervical chain or axillae bilaterally.  Gait normal with good balance and coordination.  MSK:  Non tender with full range of motion and good stability and symmetric strength and tone of shoulders, elbows, wrist, hip, knee and ankles bilaterally.  Mild arthritic changes Neck: Inspection loss of lordosis. No palpable stepoffs. Negative Spurling's maneuver. Mild limitation in range of motion lacking last 5 degrees of rotation Grip strength and sensation normal in bilateral hands Strength good C4 to T1 distribution No sensory change to C4 to T1 Negative Hoffman sign bilaterally Reflexes normal Tightness in the trapezius bilaterally   Osteopathic findings C2 flexed  rotated and side bent right C4 flexed rotated and side bent left C7 flexed rotated and side bent left T3 extended rotated and side bent right inhaled third rib     Impression and Recommendations:     This case required medical decision making of moderate complexity.      Note: This dictation was prepared with Dragon dictation along with smaller phrase technology. Any transcriptional errors that result from this process are unintentional.

## 2017-11-22 NOTE — Assessment & Plan Note (Signed)
Mild to moderate overall.  Responded well to osteopathic manipulation.  Refilled gabapentin 100 strength a day as well as 300s at night.  Discussed icing regimen.  Home exercises working on scapular dyskinesis K also given.  Discussed ergonomics.  Follow-up in 4 weeks

## 2017-11-23 NOTE — Telephone Encounter (Signed)
Called patient to follow up from last week's Diabetes self management appointment; Mr. [name] is sick and asked her call to be transferred to the triage nurse.

## 2017-11-24 ENCOUNTER — Ambulatory Visit (INDEPENDENT_AMBULATORY_CARE_PROVIDER_SITE_OTHER): Payer: Medicare Other | Admitting: Internal Medicine

## 2017-11-24 ENCOUNTER — Other Ambulatory Visit: Payer: Self-pay

## 2017-11-24 VITALS — BP 171/59 | HR 91 | Temp 97.6°F | Ht 65.0 in | Wt 186.7 lb

## 2017-11-24 DIAGNOSIS — Z87891 Personal history of nicotine dependence: Secondary | ICD-10-CM | POA: Diagnosis not present

## 2017-11-24 DIAGNOSIS — E1122 Type 2 diabetes mellitus with diabetic chronic kidney disease: Secondary | ICD-10-CM | POA: Diagnosis not present

## 2017-11-24 DIAGNOSIS — J209 Acute bronchitis, unspecified: Secondary | ICD-10-CM | POA: Diagnosis not present

## 2017-11-24 DIAGNOSIS — G4730 Sleep apnea, unspecified: Secondary | ICD-10-CM

## 2017-11-24 DIAGNOSIS — N184 Chronic kidney disease, stage 4 (severe): Secondary | ICD-10-CM

## 2017-11-24 DIAGNOSIS — I2510 Atherosclerotic heart disease of native coronary artery without angina pectoris: Secondary | ICD-10-CM | POA: Diagnosis not present

## 2017-11-24 MED ORDER — GUAIFENESIN ER 600 MG PO TB12: 600.0000 mg | ORAL_TABLET | Freq: Two times a day (BID) | ORAL | 0 refills | Status: AC | PRN

## 2017-11-24 NOTE — Progress Notes (Signed)
   CC: cough  HPI:  [name] is a 67 y.o. female with PMH of CAD, CKD stage IV, DM2, and sleep apnea who presents with cough for the last 5 days.  On 1/26, she developed a cough productive of non-bloody dark sputum. She endorses sore throat, fatigue, shortness of breath, and wheezing. She denies runny nose, fevers, allergist, or chest pain. She did not get the flu shot this year. +sick contacts. No recent travel. She has tried cough syrup and Symbicort inhaler with minimal improvement. She has no known diagnosis of COPD/asthma.  Past Medical History:  Diagnosis Date  . Anemia   . Arthritis   . Borderline personality disorder (HCC)   . CAD (coronary artery disease)    Catherization 11/18/09>nonobstructive CAD , normal LV function, EF 65%  . Cervicalgia   . Chronic kidney disease (CKD), stage IV (severe) (HCC)   . Dental caries    periodontitis and mandibular tori  . Depression   . Diabetes mellitus   . Diabetes mellitus, type II (HCC)   . Diabetic gastroparesis (HCC)   . Diastolic CHF (HCC)    Grade I, on Echo 06/2013, EF 55-60%  . Dyspnea   . Fibromyalgia   . GERD (gastroesophageal reflux disease)   . Gout   . Headache(784.0)   . Health maintenance examination    Colonoscopy 2009> normal; Diabetic eye exam 12/2008. No diabetic retinopathy; Mammogram 11/10> No evidence of malignancy, DXA 03/14/13 : normal  . History of hiatal hernia   . Hyperlipidemia   . Hypertension   . IBS (irritable bowel syndrome)   . Iliotibial band syndrome   . Low back pain syndrome   . Nonorganic psychosis (HCC)   . Peripheral neuropathy   . Right carpal tunnel syndrome 09/13/2014  . SBO (small bowel obstruction) (HCC) 01/09/2013  . Sleep apnea    does not wear CPAP   . Suicide attempt [facility](HCC)    " by overdose of whatever I had"  . Vitamin B12 deficiency   . Vitamin D deficiency   . Wears dentures    upper  . Wears glasses   . Wears glasses    Review of Systems:   GEN: Positive for  fatigue. Negative for fevers HEENT: Positive for sore throat. Negative for congestion CV: Negative for chest pain PULM: Positive for shortness of breath, cough, and wheezing. MSK: Negative for myalgias  Physical Exam:  Vitals:   11/24/17 1004  BP: (!) 171/59  Pulse: 91  Temp: 97.6 F (36.4 C)  SpO2: 100%  Weight: 186 lb 11.2 oz (84.7 kg)  Height: 5\' 5"  (1.651 m)   GEN: Female lying on exam table, appears tired and uncomfortable, NAD HEENT: PERRL. No sinus pain or tenderness. Mild posterior pharyngeal erythema. No visible lesions. NECK: No tender cervical LAD CV: NR & RR, no m/r/g PULM: CTAB, no wheezes or rhonchi MSK: No LE edema  Assessment & Plan:   See Encounters Tab for problem based charting.  Patient discussed with Dr. Cyndie ChimeGranfortuna

## 2017-11-24 NOTE — Patient Instructions (Addendum)
FOLLOW-UP INSTRUCTIONS When: 12/07/2017 (appointment already scheduled) For: health maintenance What to bring: medications   [name],  It was a pleasure to meet you today.  You most likely have bronchitis, which is usually caused by a virus. The treatment for this is supportive treatment. Please make sure to stay hydrated, trying to drink Gatorade or soup broth. For your cough, please take guaifenesin (Mucinex) 600mg  every 12 hours as needed. For your congestion and pain, you can take tylenol as needed (maximum daily dose 3000mg ). You can also try throat lozenges, hot tea, honey, and/or smoking cessation or avoidance of secondhand smoke to help improve your symptoms.  Your symptoms should get better in the next 1-2 weeks. You have an appointment on 12/07/2017, at which time we can follow up on your symptoms.   Acute Bronchitis, Adult Acute bronchitis is when air tubes (bronchi) in the lungs suddenly get swollen. The condition can make it hard to breathe. It can also cause these symptoms:  A cough.  Coughing up clear, yellow, or green mucus.  Wheezing.  Chest congestion.  Shortness of breath.  A fever.  Body aches.  Chills.  A sore throat.  Follow these instructions at home: Medicines  Take over-the-counter and prescription medicines only as told by your doctor.  If you were prescribed an antibiotic medicine, take it as told by your doctor. Do not stop taking the antibiotic even if you start to feel better. General instructions  Rest.  Drink enough fluids to keep your pee (urine) clear or pale yellow.  Avoid smoking and secondhand smoke. If you smoke and you need help quitting, ask your doctor. Quitting will help your lungs heal faster.  Use an inhaler, cool mist vaporizer, or humidifier as told by your doctor.  Keep all follow-up visits as told by your doctor. This is important. How is this prevented? To lower your risk of getting this condition again:  Wash  your hands often with soap and water. If you cannot use soap and water, use hand sanitizer.  Avoid contact with people who have cold symptoms.  Try not to touch your hands to your mouth, nose, or eyes.  Make sure to get the flu shot every year.  Contact a doctor if:  Your symptoms do not get better in 2 weeks. Get help right away if:  You cough up blood.  You have chest pain.  You have very bad shortness of breath.  You become dehydrated.  You faint (pass out) or keep feeling like you are going to pass out.  You keep throwing up (vomiting).  You have a very bad headache.  Your fever or chills gets worse. This information is not intended to replace advice given to you by your health care provider. Make sure you discuss any questions you have with your health care provider. Document Released: 03/29/2008 Document Revised: 05/19/2016 Document Reviewed: 03/31/2016 Elsevier Interactive Patient Education  [employer]2018 Elsevier Inc.

## 2017-11-24 NOTE — Assessment & Plan Note (Signed)
Assessment Patient with s/s of acute bronchitis, most likely secondary to virus. She has tried cough syrup and symbicort inhaler with minimal relief. No known PMH of COPD/asthma. I do not think she would benefit from empiric antibiotics at this point. Discussed with patient that it may take a little bit of time for her symptoms to improve. Will continue supportive therapy and patient advised of return precautions. Patient agreeable with plan.  Plan - Guaifenesin 600mg  BID PRN for cough - Encouraged PO hydration - F/u with PCP in ~2 weeks as scheduled

## 2017-11-24 NOTE — Progress Notes (Signed)
Medicine attending: Medical history, presenting problems, physical findings, and medications, reviewed with resident physician Dr Scherrie GerlachJennifer Huang on the day of the patient visit and I concur with her evaluation and management plan. Patient has a cold; we will Rx symptomatically; no antibiotics at this time.

## 2017-11-27 ENCOUNTER — Emergency Department (HOSPITAL_COMMUNITY)
Admission: EM | Admit: 2017-11-27 | Discharge: 2017-12-23 | Disposition: E | Payer: Medicare Other | Attending: Emergency Medicine | Admitting: Emergency Medicine

## 2017-11-27 DIAGNOSIS — I5032 Chronic diastolic (congestive) heart failure: Secondary | ICD-10-CM | POA: Diagnosis not present

## 2017-11-27 DIAGNOSIS — I132 Hypertensive heart and chronic kidney disease with heart failure and with stage 5 chronic kidney disease, or end stage renal disease: Secondary | ICD-10-CM | POA: Insufficient documentation

## 2017-11-27 DIAGNOSIS — I469 Cardiac arrest, cause unspecified: Secondary | ICD-10-CM

## 2017-11-27 DIAGNOSIS — I2510 Atherosclerotic heart disease of native coronary artery without angina pectoris: Secondary | ICD-10-CM | POA: Diagnosis not present

## 2017-11-27 DIAGNOSIS — N185 Chronic kidney disease, stage 5: Secondary | ICD-10-CM | POA: Insufficient documentation

## 2017-11-27 DIAGNOSIS — Z79899 Other long term (current) drug therapy: Secondary | ICD-10-CM | POA: Insufficient documentation

## 2017-11-27 DIAGNOSIS — Z7982 Long term (current) use of aspirin: Secondary | ICD-10-CM | POA: Diagnosis not present

## 2017-11-27 DIAGNOSIS — Z7984 Long term (current) use of oral hypoglycemic drugs: Secondary | ICD-10-CM | POA: Diagnosis not present

## 2017-11-27 DIAGNOSIS — E1122 Type 2 diabetes mellitus with diabetic chronic kidney disease: Secondary | ICD-10-CM | POA: Diagnosis not present

## 2017-11-27 DIAGNOSIS — Z87891 Personal history of nicotine dependence: Secondary | ICD-10-CM | POA: Diagnosis not present

## 2017-11-27 NOTE — ED Notes (Signed)
ETT tube removed, pt cleaned and room prepared for family

## 2017-11-27 NOTE — ED Provider Notes (Signed)
[facility] EMERGENCY DEPARTMENT Provider Note   CSN: [id number] Arrival date & time: Dec 21, 2017  2151     History   Chief Complaint No chief complaint on file.   HPI [name] is a 67 y.o. female.  Patient presents with CPR in progress.  She was found down in her bedroom for an unknown amount of time.  EMS was called and found the patient pulseless and started CPR.  The patient briefly regained pulses and EKG showed wide qrs.  On arrival, total cpr time was greater than 60 minutes.  A total of 6 rounds of epinephrine provided by EMS.  Patient has history of DM, kidney disease, CHF and recent illness.      Past Medical History:  Diagnosis Date  . Anemia   . Arthritis   . Borderline personality disorder (HCC)   . CAD (coronary artery disease)    Catherization 11/18/09>nonobstructive CAD , normal LV function, EF 65%  . Cervicalgia   . Chronic kidney disease (CKD), stage IV (severe) (HCC)   . Dental caries    periodontitis and mandibular tori  . Depression   . Diabetes mellitus   . Diabetes mellitus, type II (HCC)   . Diabetic gastroparesis (HCC)   . Diastolic CHF (HCC)    Grade I, on Echo 06/2013, EF 55-60%  . Dyspnea   . Fibromyalgia   . GERD (gastroesophageal reflux disease)   . Gout   . Headache(784.0)   . Health maintenance examination    Colonoscopy 2009> normal; Diabetic eye exam 12/2008. No diabetic retinopathy; Mammogram 11/10> No evidence of malignancy, DXA 03/14/13 : normal  . History of hiatal hernia   . Hyperlipidemia   . Hypertension   . IBS (irritable bowel syndrome)   . Iliotibial band syndrome   . Low back pain syndrome   . Nonorganic psychosis (HCC)   . Peripheral neuropathy   . Right carpal tunnel syndrome 09/13/2014  . SBO (small bowel obstruction) (HCC) 01/09/2013  . Sleep apnea    does not wear CPAP   . Suicide attempt [facility])    " by overdose of whatever I had"  . Vitamin B12 deficiency   . Vitamin D deficiency   . Wears  dentures    upper  . Wears glasses   . Wears glasses     Patient Active Problem List   Diagnosis Date Noted  . Bronchitis, acute 11/24/2017  . Degenerative disc disease, cervical 11/22/2017  . Nonallopathic lesion of cervical region 11/22/2017  . Recurrent falls 10/13/2017  . Nocturia more than twice per night 07/05/2017  . CKD stage 4 due to type 2 diabetes mellitus (HCC) 07/05/2017  . Facet arthritis of lumbar region (HCC) 06/14/2017  . New onset of headaches after age 53 04/11/2017  . Vaginal dryness 01/12/2017  . Arthritis of sacroiliac joint (HCC) 12/06/2016  . Posterior pain of hip, left 11/25/2016  . Ischial pain 06/21/2016  . Ischial bursitis 06/09/2016  . Vaginal discharge 05/05/2016  . Hepatic steatosis 05/03/2016  . History of Helicobacter pylori infection 05/03/2016  . Diarrhea 02/10/2016  . Hyperkalemia 01/27/2016  . Diastolic CHF (HCC) 01/26/2016  . At high risk for falls 12/25/2015  . Secondary hyperparathyroidism of renal origin (HCC) 08/08/2015  . CKD (chronic kidney disease) stage 3, GFR 30-59 ml/min (HCC) 08/05/2015  . Preventative health care 06/18/2015  . Degenerative lumbar disc 12/30/2014  . Degenerative joint disease involving multiple joints on both sides of body (right foot pain) 08/14/2014  .  Chronic low back pain 04/15/2014  . Fibromyalgia syndrome 06/18/2013  . Fatigue and Dizziness 05/09/2013  . Abdominal pain, epigastric 03/29/2013  . Myalgia 12/21/2012  . Chronic daily headache 05/24/2012  . Depression 07/14/2011  . Anxiety 06/02/2011  . Lumbar spondylosis with myelopathy 11/20/2010  . Coronary atherosclerosis 03/10/2010  . Neck pain 06/05/2009  . Dyslipidemia 12/11/2008  . Vitamin D deficiency 11/26/2008  . Uncontrolled type 2 diabetes mellitus (HCC) 05/25/2007  . PERIPHERAL NEUROPATHY 05/25/2007  . Essential hypertension 05/25/2007  . GERD 05/25/2007  . Sleep apnea 05/25/2007    Past Surgical History:  Procedure Laterality Date    . ABDOMINAL HYSTERECTOMY    . APPENDECTOMY    . BOWEL RESECTION N/A 01/10/2013   Procedure: SMALL BOWEL RESECTION;  Surgeon: Lodema PilotBrian Layton, DO;  Location: [facility];  Service: General;  Laterality: N/A;  . CARPAL TUNNEL RELEASE Right 09/13/2014   Procedure: RIGHT WRIST CARPAL TUNNEL RELEASE;  Surgeon: Eulas PostJoshua P Landau, MD;  Location: [facility];  Service: Orthopedics;  Laterality: Right;  . CARPAL TUNNEL RELEASE Left 02/28/2015   Procedure: LEFT CARPAL TUNNEL RELEASE;  Surgeon: Teryl LucyJoshua Landau, MD;  Location: [facility];  Service: Orthopedics;  Laterality: Left;  . CATARACT EXTRACTION W/ INTRAOCULAR LENS  IMPLANT, BILATERAL    . COLONOSCOPY    . HAND SURGERY  1992   rt  . HERNIA REPAIR  03/01/14   Lap incisional hernia repair w/mesh  . INCISIONAL HERNIA REPAIR N/A 03/01/2014   Procedure: LAPAROSCOPIC INCISIONAL HERNIA;  Surgeon: Axel FillerArmando Ramirez, MD;  Location: [facility];  Service: General;  Laterality: N/A;  . INSERTION OF MESH N/A 03/01/2014   Procedure: INSERTION OF MESH;  Surgeon: Axel FillerArmando Ramirez, MD;  Location: [facility];  Service: General;  Laterality: N/A;  . LAPAROTOMY N/A 01/10/2013   Procedure: EXPLORATORY LAPAROTOMY;  Surgeon: Lodema PilotBrian Layton, DO;  Location: [facility];  Service: General;  Laterality: N/A;  . LYSIS OF ADHESION N/A 01/10/2013   Procedure: LYSIS OF ADHESION;  Surgeon: Lodema PilotBrian Layton, DO;  Location: [facility];  Service: General;  Laterality: N/A;  . MULTIPLE TOOTH EXTRACTIONS    . STERIOD INJECTION Left 09/13/2014   Procedure: STEROID INJECTION LEFT HAND;  Surgeon: Eulas PostJoshua P Landau, MD;  Location: [facility];  Service: Orthopedics;  Laterality: Left;  . TOOTH EXTRACTION Bilateral 05/13/2017   Procedure: DENTAL RESTORATION/EXTRACTIONS;  Surgeon: Ocie DoyneJensen, Scott, DDS;  [facility];  Service: Oral Surgery;  Laterality: Bilateral;    OB History    No data available       Home Medications    Prior to Admission medications   Medication Sig Start Date  End Date Taking? Authorizing Provider  ACCU-CHEK AVIVA PLUS test strip TEST UPTO 4 TIMES DAILY Patient taking differently: TEST UP TO 4 TIMES DAILY 06/17/15   Denton Brickruong, [name] M, MD  ACCU-CHEK FASTCLIX LANCETS MISC TEST five times a day 12/02/15   Denton Brickruong, [name] M, MD  acetaminophen (TYLENOL) 325 MG tablet Take 650 mg by mouth 3 (three) times daily as needed for mild pain or headache.    [provider]  AMITIZA 24 MCG capsule Take 24 mcg by mouth 2 (two) times daily.  06/15/16   [provider]  aspirin EC 81 MG tablet Take 81 mg by mouth daily.    [provider]  Biotin 5000 MCG TABS Take 5,000 mcg by mouth daily.    [provider]  buPROPion (WELLBUTRIN XL) 150 MG 24 hr tablet Take 3 tablets (  450 mg total) by mouth daily. 09/30/17   Plovsky, Earvin Hansen, MD  cloNIDine (CATAPRES - DOSED IN MG/24 HR) 0.3 mg/24hr patch Place 1 patch (0.3 mg total) onto the skin once a week. 07/21/17   Lacroce, Ames Coupe, MD  dexlansoprazole (DEXILANT) 60 MG capsule Take 1 capsule (60 mg total) by mouth daily. For reflux 11/13/14   Rankin, Shuvon B, NP  furosemide (LASIX) 40 MG tablet Take 1 tablet (40 mg total) by mouth daily. 09/28/17   Lacroce, Ames Coupe, MD  gabapentin (NEURONTIN) 100 MG capsule Take 1 capsule (100 mg total) by mouth 2 (two) times daily. 07/21/17   Toney Rakes, MD  gabapentin (NEURONTIN) 300 MG capsule Take 2 capsules at bedtime 09/20/17   Van Clines, MD  guaiFENesin (MUCINEX) 600 MG 12 hr tablet Take 1 tablet (600 mg total) by mouth 2 (two) times daily as needed for up to 5 days for cough. 11/24/17 11/29/17  Scherrie Gerlach, MD  Insulin Glargine (LANTUS SOLOSTAR) 100 UNIT/ML Solostar Pen inject subcutaneously 45 units at bedtime 07/21/17   Lacroce, Ames Coupe, MD  insulin lispro (HUMALOG) 100 UNIT/ML KiwkPen Inject 0.1 mLs (10 Units total) into the skin 3 (three) times daily before meals. 11/09/17   Ginger Carne, MD  Insulin Pen Needle (BD PEN NEEDLE NANO U/F) 32G  X 4 MM MISC Use to inject insulin into the skin 07/21/17   Lacroce, Ames Coupe, MD  LINZESS 145 MCG CAPS capsule Take 145 mcg by mouth daily as needed (consitpation).  06/07/16   [provider]  LORazepam (ATIVAN) 0.5 MG tablet 2  qam  1 qhs Patient taking differently: Take 0.5-1 mg by mouth 2 (two) times daily. Take 2  qam and  1 qhs 09/30/17   Plovsky, Earvin Hansen, MD  metoprolol succinate (TOPROL-XL) 100 MG 24 hr tablet Take 1 tablet (100 mg total) by mouth every morning. Take with or immediately following a meal. 07/21/17   Lacroce, Ames Coupe, MD  Misc Natural Products (TART CHERRY ADVANCED PO) Take 1 capsule by mouth every evening.    [provider]  promethazine (PHENERGAN) 25 MG tablet Take 25 mg by mouth 2 (two) times daily as needed for nausea or vomiting.  05/03/16   [provider]  rosuvastatin (CRESTOR) 10 MG tablet Take 1 tablet (10 mg total) by mouth daily. 01/12/17   Denton Brick, MD  sertraline (ZOLOFT) 100 MG tablet Take 2 tablets (200 mg total) by mouth at bedtime. 09/30/17   Plovsky, Earvin Hansen, MD  sitaGLIPtin (JANUVIA) 50 MG tablet Take 1 tablet (50 mg total) by mouth daily. 09/28/17   Lacroce, Ames Coupe, MD  TURMERIC CURCUMIN PO Take 1 tablet by mouth at bedtime.    [provider]  Vitamin D, Ergocalciferol, (DRISDOL) 50000 units CAPS capsule take 1 capsule by mouth every week Patient taking differently: take 1 capsule by mouth every week on Monday 08/22/17   Judi Saa, DO    Family History Family History  Problem Relation Age of Onset  . Diabetes Mother   . Hypertension Mother   . Hyperlipidemia Mother   . Arthritis Mother   . Depression Mother   . Heart disease Father   . Diabetes Brother   . Heart disease Brother   . Diabetes Brother   . Heart disease Paternal Grandmother     Social History Social History   Tobacco Use  . Smoking status: Former Smoker    Types: Cigarettes    Last attempt to  quit: 10/25/2002    Years since  quitting: 15.1  . Smokeless tobacco: Never Used  Substance Use Topics  . Alcohol use: No    Alcohol/week: 0.0 oz  . Drug use: No     Allergies   Penicillins; Cimetidine; Indomethacin; Adhesive [tape]; and Sulfamethoxazole   Review of Systems Review of Systems  Unable to perform ROS: Patient unresponsive     Physical Exam Updated Vital Signs There were no vitals taken for this visit.  Physical Exam  Constitutional: She appears well-developed and well-nourished. No distress.  HENT:  Head: Normocephalic and atraumatic.  Eyes: Conjunctivae are normal.  Neck: No tracheal deviation present.  Cardiovascular:  Samuel Bouche device active with CPR in progress.  Pulmonary/Chest: Breath sounds normal.  Intubated, bag ventilated.  Abdominal: She exhibits distension (mild).  Musculoskeletal: She exhibits no edema or deformity.  Neurological:  Unresponsive  Skin: Skin is dry.  Nursing note and vitals reviewed.    ED Treatments / Results  Labs (all labs ordered are listed, but only abnormal results are displayed) Labs Reviewed - No data to display  EKG  EKG Interpretation None       Radiology No results found.  Procedures Procedures (including critical care time)  Medications Ordered in ED Medications - No data to display   Initial Impression / Assessment and Plan / ED Course  I have reviewed the triage vital signs and the nursing notes.  Pertinent labs & imaging results that were available during my care of the patient were reviewed by me and considered in my medical decision making (see chart for details).     Ms. Lombard is a 67 year old female with past medical history significant for diabetes, CHF, kidney disease who presents for cardiac arrest.  Patient was intubated in route.  Bilateral breath sounds appreciated.  BG noted to be too high to read.  EMS performed CPR for in excess of 60 minutes.  She has been given epinephrine.  Brief return of pulses indicated  wide-complex rhythm.  EMS quickly lost pulses again.  On arrival, the patient has a Materials engineer device in place.  Initial pulse check reveals no cardiac activity by ultrasound and no palpable pulses.  Epinephrine and sodium bicarbonate were administered and an additional round of compressions were provided.  On subsequent pulse check, patient still had no cardiac activity or palpable pulses.  After further discussion of the case with all present in the room, no further interventions were indicated.  The patient's time of death was noted to be 21:56.  Subsequent discussions with family indicate the patient had been ill for the past few days and has not been eating.  Final Clinical Impressions(s) / ED Diagnoses   Final diagnoses:  Cardiac arrest [facility])    ED Discharge Orders    None       Garey Ham, MD December 23, 2017 2250    Blane Ohara, MD 12-23-17 [phone]

## 2017-11-27 NOTE — Progress Notes (Signed)
   12/14/2017 2200  Clinical Encounter Type  Visited With Family  Visit Type Death  Referral From Nurse  Spiritual Encounters  Spiritual Needs Emotional;Grief support   Chaplain provided support for [name]'s husband and many family members on the occasion of her death. They have a close-knit, supportive family. Mr. [name] is a Education officer, environmentalpastor and another pastor from their church also came. Mr. [name] went home with his daughter. Chaplain provided contact info to one of the attending nurses.

## 2017-11-28 MED FILL — Medication: Qty: 1 | Status: AC

## 2017-12-01 ENCOUNTER — Ambulatory Visit: Payer: Self-pay | Admitting: Nurse Practitioner

## 2017-12-07 ENCOUNTER — Encounter: Payer: Self-pay | Admitting: Internal Medicine

## 2017-12-07 ENCOUNTER — Ambulatory Visit (HOSPITAL_COMMUNITY): Payer: Self-pay | Admitting: Psychiatry

## 2017-12-23 NOTE — ED Triage Notes (Signed)
Pt arrived via [facility] from home, [name] in place, CPR in progress.

## 2017-12-23 NOTE — ED Triage Notes (Signed)
2150-Pt arrived via EMS, CPR in progress via [place]. Pt found unresponsive by husband @ 2030, CPR started by fire @ 2057, EMS arrived pt apneic, pulseless in asystole. IO est RLE, @ 400ml NS given, pt received Epi x 4 pt did briefly regain a palpable pulse enroute, pt again became pulseless, CPR resumed, Epi x 2 given during subsequent rounds of CPR.  7.0 ETT placed by EMS.  CBG HIGH PTA Capnography 8 2151 pulsecheck, no pulse detected, asystole in 2 leads on monitor, CPR resumed 2151 1 amp bicarb given via IO 2153 Epi given via IO 2155 pulsecheck, no pulse detected, asystole in 2 leads on monitor 2156 TOD by Dr. August Saucerean

## 2017-12-23 NOTE — ED Notes (Signed)
[name] [phone] daughter [name] [phone]

## 2017-12-23 NOTE — ED Notes (Signed)
[facility] called possible eye and tissue, eye care done prior to transport to morgue.

## 2017-12-23 NOTE — ED Notes (Signed)
Per family pt has been ill for several days with a recent visit to PCP for bronchitis Per husband pt has not eaten for several days d/t not feeling well, husband states he was able to get pt to drink small amount of water returned to find pt unresponsive.

## 2017-12-23 DEATH — deceased

## 2018-01-04 ENCOUNTER — Ambulatory Visit (HOSPITAL_COMMUNITY): Payer: Self-pay | Admitting: Licensed Clinical Social Worker

## 2018-01-04 ENCOUNTER — Ambulatory Visit: Payer: Self-pay | Admitting: Neurology

## 2018-02-14 ENCOUNTER — Ambulatory Visit: Payer: Self-pay | Admitting: Neurology
